# Patient Record
Sex: Female | Born: 1940 | Race: White | Hispanic: No | Smoking: Never smoker
Health system: Southern US, Community
[De-identification: ages and names within clinical notes are randomized; demographics above are authoritative.]

## PROBLEM LIST (undated history)

## (undated) DIAGNOSIS — L02611 Cutaneous abscess of right foot: Secondary | ICD-10-CM

## (undated) DIAGNOSIS — M1990 Unspecified osteoarthritis, unspecified site: Secondary | ICD-10-CM

## (undated) DIAGNOSIS — I350 Nonrheumatic aortic (valve) stenosis: Secondary | ICD-10-CM

## (undated) DIAGNOSIS — J45909 Unspecified asthma, uncomplicated: Secondary | ICD-10-CM

## (undated) DIAGNOSIS — I10 Essential (primary) hypertension: Secondary | ICD-10-CM

## (undated) DIAGNOSIS — R55 Syncope and collapse: Secondary | ICD-10-CM

## (undated) DIAGNOSIS — M810 Age-related osteoporosis without current pathological fracture: Secondary | ICD-10-CM

## (undated) DIAGNOSIS — E109 Type 1 diabetes mellitus without complications: Secondary | ICD-10-CM

## (undated) DIAGNOSIS — R011 Cardiac murmur, unspecified: Secondary | ICD-10-CM

## (undated) DIAGNOSIS — H409 Unspecified glaucoma: Secondary | ICD-10-CM

## (undated) DIAGNOSIS — I739 Peripheral vascular disease, unspecified: Secondary | ICD-10-CM

## (undated) DIAGNOSIS — S129XXA Fracture of neck, unspecified, initial encounter: Secondary | ICD-10-CM

## (undated) DIAGNOSIS — I779 Disorder of arteries and arterioles, unspecified: Secondary | ICD-10-CM

## (undated) DIAGNOSIS — E785 Hyperlipidemia, unspecified: Secondary | ICD-10-CM

## (undated) DIAGNOSIS — H269 Unspecified cataract: Secondary | ICD-10-CM

## (undated) HISTORY — DX: Unspecified glaucoma: H40.9

## (undated) HISTORY — PX: TYMPANOSTOMY TUBE PLACEMENT: SHX32

## (undated) HISTORY — PX: FRACTURE SURGERY: SHX138

## (undated) HISTORY — DX: Cutaneous abscess of right foot: L02.611

## (undated) HISTORY — DX: Age-related osteoporosis without current pathological fracture: M81.0

## (undated) HISTORY — DX: Unspecified cataract: H26.9

## (undated) HISTORY — PX: JOINT REPLACEMENT: SHX530

## (undated) HISTORY — PX: EYE SURGERY: SHX253

---
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
## 1977-11-27 HISTORY — PX: TUBAL LIGATION: SHX77

## 1986-11-27 HISTORY — PX: BREAST SURGERY: SHX581

## 1996-11-27 HISTORY — PX: HAMMER TOE SURGERY: SHX385

## 1998-06-09 ENCOUNTER — Ambulatory Visit (HOSPITAL_COMMUNITY): Admission: RE | Admit: 1998-06-09 | Discharge: 1998-06-09 | Payer: Self-pay | Admitting: Orthopedic Surgery

## 1998-07-15 ENCOUNTER — Other Ambulatory Visit: Admission: RE | Admit: 1998-07-15 | Discharge: 1998-07-15 | Payer: Self-pay | Admitting: Gynecology

## 1998-09-08 ENCOUNTER — Other Ambulatory Visit: Admission: RE | Admit: 1998-09-08 | Discharge: 1998-09-08 | Payer: Self-pay | Admitting: Gynecology

## 1998-09-10 ENCOUNTER — Other Ambulatory Visit: Admission: RE | Admit: 1998-09-10 | Discharge: 1998-09-10 | Payer: Self-pay | Admitting: General Surgery

## 1999-02-04 ENCOUNTER — Ambulatory Visit (HOSPITAL_COMMUNITY): Admission: RE | Admit: 1999-02-04 | Discharge: 1999-02-04 | Payer: Self-pay | Admitting: Orthopedic Surgery

## 1999-09-14 ENCOUNTER — Other Ambulatory Visit: Admission: RE | Admit: 1999-09-14 | Discharge: 1999-09-14 | Payer: Self-pay | Admitting: Gynecology

## 1999-09-14 ENCOUNTER — Encounter: Payer: Self-pay | Admitting: Gynecology

## 1999-09-14 ENCOUNTER — Encounter: Admission: RE | Admit: 1999-09-14 | Discharge: 1999-09-14 | Payer: Self-pay | Admitting: Gynecology

## 1999-11-28 HISTORY — PX: ANKLE FRACTURE SURGERY: SHX122

## 2000-01-04 ENCOUNTER — Encounter: Payer: Self-pay | Admitting: Gynecology

## 2000-01-04 ENCOUNTER — Encounter: Admission: RE | Admit: 2000-01-04 | Discharge: 2000-01-04 | Payer: Self-pay | Admitting: Gynecology

## 2000-02-27 ENCOUNTER — Other Ambulatory Visit: Admission: RE | Admit: 2000-02-27 | Discharge: 2000-02-27 | Payer: Self-pay | Admitting: Gynecology

## 2000-03-17 ENCOUNTER — Encounter: Payer: Self-pay | Admitting: Emergency Medicine

## 2000-03-17 ENCOUNTER — Inpatient Hospital Stay (HOSPITAL_COMMUNITY): Admission: EM | Admit: 2000-03-17 | Discharge: 2000-03-20 | Payer: Self-pay | Admitting: Emergency Medicine

## 2000-03-17 ENCOUNTER — Encounter: Payer: Self-pay | Admitting: Orthopedic Surgery

## 2000-03-19 ENCOUNTER — Encounter: Payer: Self-pay | Admitting: Orthopedic Surgery

## 2000-05-14 ENCOUNTER — Encounter: Admission: RE | Admit: 2000-05-14 | Discharge: 2000-06-27 | Payer: Self-pay | Admitting: Orthopedic Surgery

## 2001-08-14 ENCOUNTER — Encounter: Admission: RE | Admit: 2001-08-14 | Discharge: 2001-11-12 | Payer: Self-pay | Admitting: *Deleted

## 2001-09-10 ENCOUNTER — Encounter: Admission: RE | Admit: 2001-09-10 | Discharge: 2001-09-10 | Payer: Self-pay | Admitting: Gynecology

## 2001-09-10 ENCOUNTER — Encounter: Payer: Self-pay | Admitting: Gynecology

## 2001-10-01 ENCOUNTER — Other Ambulatory Visit: Admission: RE | Admit: 2001-10-01 | Discharge: 2001-10-01 | Payer: Self-pay | Admitting: Gynecology

## 2003-06-11 ENCOUNTER — Other Ambulatory Visit: Admission: RE | Admit: 2003-06-11 | Discharge: 2003-06-11 | Payer: Self-pay | Admitting: Gynecology

## 2003-07-16 ENCOUNTER — Inpatient Hospital Stay (HOSPITAL_COMMUNITY): Admission: EM | Admit: 2003-07-16 | Discharge: 2003-07-22 | Payer: Self-pay | Admitting: Emergency Medicine

## 2003-07-16 ENCOUNTER — Encounter: Payer: Self-pay | Admitting: Emergency Medicine

## 2003-07-17 ENCOUNTER — Encounter: Payer: Self-pay | Admitting: Family Medicine

## 2003-07-17 ENCOUNTER — Encounter (INDEPENDENT_AMBULATORY_CARE_PROVIDER_SITE_OTHER): Payer: Self-pay | Admitting: *Deleted

## 2003-07-17 ENCOUNTER — Encounter: Payer: Self-pay | Admitting: Interventional Cardiology

## 2003-07-20 ENCOUNTER — Encounter: Payer: Self-pay | Admitting: Neurology

## 2003-10-12 ENCOUNTER — Encounter: Admission: RE | Admit: 2003-10-12 | Discharge: 2003-10-12 | Payer: Self-pay | Admitting: Gynecology

## 2003-11-17 ENCOUNTER — Ambulatory Visit (HOSPITAL_COMMUNITY): Admission: RE | Admit: 2003-11-17 | Discharge: 2003-11-17 | Payer: Self-pay | Admitting: Gastroenterology

## 2004-01-20 ENCOUNTER — Encounter (HOSPITAL_BASED_OUTPATIENT_CLINIC_OR_DEPARTMENT_OTHER): Admission: RE | Admit: 2004-01-20 | Discharge: 2004-04-15 | Payer: Self-pay | Admitting: Internal Medicine

## 2004-04-27 ENCOUNTER — Encounter (HOSPITAL_BASED_OUTPATIENT_CLINIC_OR_DEPARTMENT_OTHER): Admission: RE | Admit: 2004-04-27 | Discharge: 2004-07-26 | Payer: Self-pay | Admitting: Internal Medicine

## 2004-06-13 ENCOUNTER — Other Ambulatory Visit: Admission: RE | Admit: 2004-06-13 | Discharge: 2004-06-13 | Payer: Self-pay | Admitting: Gynecology

## 2004-11-14 ENCOUNTER — Encounter (HOSPITAL_BASED_OUTPATIENT_CLINIC_OR_DEPARTMENT_OTHER): Admission: RE | Admit: 2004-11-14 | Discharge: 2005-01-02 | Payer: Self-pay | Admitting: Internal Medicine

## 2005-06-14 ENCOUNTER — Other Ambulatory Visit: Admission: RE | Admit: 2005-06-14 | Discharge: 2005-06-14 | Payer: Self-pay | Admitting: Gynecology

## 2005-08-08 ENCOUNTER — Encounter: Admission: RE | Admit: 2005-08-08 | Discharge: 2005-08-08 | Payer: Self-pay | Admitting: Gynecology

## 2006-01-23 ENCOUNTER — Ambulatory Visit (HOSPITAL_COMMUNITY): Admission: RE | Admit: 2006-01-23 | Discharge: 2006-01-23 | Payer: Self-pay | Admitting: Interventional Cardiology

## 2006-07-04 ENCOUNTER — Other Ambulatory Visit: Admission: RE | Admit: 2006-07-04 | Discharge: 2006-07-04 | Payer: Self-pay | Admitting: Gynecology

## 2006-08-10 ENCOUNTER — Encounter: Admission: RE | Admit: 2006-08-10 | Discharge: 2006-08-10 | Payer: Self-pay | Admitting: Gynecology

## 2007-04-04 ENCOUNTER — Encounter: Admission: RE | Admit: 2007-04-04 | Discharge: 2007-05-27 | Payer: Self-pay | Admitting: *Deleted

## 2007-10-30 ENCOUNTER — Encounter: Payer: Self-pay | Admitting: Endocrinology

## 2007-11-13 ENCOUNTER — Encounter: Payer: Self-pay | Admitting: Endocrinology

## 2007-11-13 ENCOUNTER — Other Ambulatory Visit: Admission: RE | Admit: 2007-11-13 | Discharge: 2007-11-13 | Payer: Self-pay | Admitting: Gynecology

## 2007-12-12 ENCOUNTER — Encounter: Admission: RE | Admit: 2007-12-12 | Discharge: 2007-12-12 | Payer: Self-pay | Admitting: Gynecology

## 2007-12-25 ENCOUNTER — Encounter: Admission: RE | Admit: 2007-12-25 | Discharge: 2007-12-25 | Payer: Self-pay | Admitting: Orthopedic Surgery

## 2007-12-30 ENCOUNTER — Encounter: Payer: Self-pay | Admitting: Endocrinology

## 2008-01-01 ENCOUNTER — Encounter: Payer: Self-pay | Admitting: Endocrinology

## 2008-01-01 ENCOUNTER — Encounter: Admission: RE | Admit: 2008-01-01 | Discharge: 2008-01-01 | Payer: Self-pay | Admitting: Orthopedic Surgery

## 2008-02-05 ENCOUNTER — Encounter: Payer: Self-pay | Admitting: Endocrinology

## 2008-02-11 ENCOUNTER — Ambulatory Visit: Payer: Self-pay | Admitting: Endocrinology

## 2008-02-11 DIAGNOSIS — E042 Nontoxic multinodular goiter: Secondary | ICD-10-CM | POA: Insufficient documentation

## 2008-02-11 LAB — CONVERTED CEMR LAB: TSH: 1.11 microintl units/mL (ref 0.35–5.50)

## 2008-02-25 ENCOUNTER — Encounter: Admission: RE | Admit: 2008-02-25 | Discharge: 2008-02-25 | Payer: Self-pay | Admitting: Endocrinology

## 2008-03-04 ENCOUNTER — Encounter: Payer: Self-pay | Admitting: Internal Medicine

## 2008-03-04 ENCOUNTER — Other Ambulatory Visit: Admission: RE | Admit: 2008-03-04 | Discharge: 2008-03-04 | Payer: Self-pay | Admitting: Interventional Radiology

## 2008-03-04 ENCOUNTER — Encounter: Admission: RE | Admit: 2008-03-04 | Discharge: 2008-03-04 | Payer: Self-pay | Admitting: Endocrinology

## 2008-03-04 ENCOUNTER — Encounter (INDEPENDENT_AMBULATORY_CARE_PROVIDER_SITE_OTHER): Payer: Self-pay | Admitting: Interventional Radiology

## 2008-03-26 ENCOUNTER — Emergency Department (HOSPITAL_COMMUNITY): Admission: EM | Admit: 2008-03-26 | Discharge: 2008-03-26 | Payer: Self-pay | Admitting: Emergency Medicine

## 2008-07-09 ENCOUNTER — Encounter: Admission: RE | Admit: 2008-07-09 | Discharge: 2008-09-07 | Payer: Self-pay | Admitting: Family Medicine

## 2008-11-27 HISTORY — PX: TOTAL SHOULDER REPLACEMENT: SUR1217

## 2008-12-08 ENCOUNTER — Inpatient Hospital Stay (HOSPITAL_COMMUNITY): Admission: RE | Admit: 2008-12-08 | Discharge: 2008-12-10 | Payer: Self-pay | Admitting: Orthopedic Surgery

## 2009-02-26 ENCOUNTER — Encounter: Admission: RE | Admit: 2009-02-26 | Discharge: 2009-02-26 | Payer: Self-pay | Admitting: Family Medicine

## 2009-11-27 HISTORY — PX: CATARACT EXTRACTION W/ INTRAOCULAR LENS  IMPLANT, BILATERAL: SHX1307

## 2009-12-19 ENCOUNTER — Observation Stay (HOSPITAL_COMMUNITY): Admission: EM | Admit: 2009-12-19 | Discharge: 2009-12-19 | Payer: Self-pay | Admitting: Emergency Medicine

## 2010-12-27 NOTE — Assessment & Plan Note (Signed)
Summary: NEW PT/MULTI GOITER -$50 INFO-MEDICRE PER BETH/DR [name]/PC M...   Vital Signs:  Patient Profile:   70 Years Old Female Weight:      142 pounds Temp:     98.1 degrees F oral Pulse rate:   84 / minute Pulse rhythm:   regular BP sitting:   144 / 58  (right arm)  Vitals Entered By: Rock Nephew CMA (February 11, 2008 3:54 PM)                 Visit Type:  Consult Referred by:  b Zachery Dauer, md  Chief Complaint:  thyroid.  History of Present Illness: was incidentally noted on a ct done 12/25/07 (for shoulder pain) to have goiter.  pt does notice the goiter.  denies dysphagia.  feels well in general.  has a tremor only with hypoglycemia.    Current Allergies: ! TETRACYCLINE ! CECLOR ! VANCOMYCIN  Past Medical History:    Reviewed history and no changes required:       good general health   Family History:    Reviewed history and no changes required:       no thyroid dz  Social History:    Reviewed history and no changes required:       divorced       retired    Review of Systems  The patient denies dyspnea on exhertion.     Physical Exam  General:     well developed, well nourished, in no acute distress Neck:     slightly enlarged thyroid, with an irregular surface, but i do not appreciate the right lower pole nodule seen on the ultrasound. 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 Take 1 tablet by mouth once a day 2)   Fosamax 70 Mg Tabs (Alendronate sodium) .... Weekly 3)  Calcium 600mg   4)  Vitamin C500mg   5)  Flax Seed Oil 1000mg     Patient Instructions: 1)  us-guided bx 2)  ret 6 mos if ok 3)  cc dr b Zachery Dauer    ]  Appended Document: NEW PT/MULTI GOITER -$50 INFO-MEDICRE PER BETH/DR [name]/PC M... FAXED NOTES TO DR [name] @ [phone]/LMB

## 2011-01-03 ENCOUNTER — Other Ambulatory Visit: Payer: Self-pay | Admitting: Gynecology

## 2011-02-12 LAB — POCT I-STAT, CHEM 8
BUN: 14 mg/dL (ref 6–23)
Calcium, Ion: 1.09 mmol/L — ABNORMAL LOW (ref 1.12–1.32)
Chloride: 102 mEq/L (ref 96–112)
Creatinine, Ser: 0.9 mg/dL (ref 0.4–1.2)
Glucose, Bld: 251 mg/dL — ABNORMAL HIGH (ref 70–99)
HCT: 39 % (ref 36.0–46.0)
Hemoglobin: 13.3 g/dL (ref 12.0–15.0)
Potassium: 3.6 mEq/L (ref 3.5–5.1)
Sodium: 136 mEq/L (ref 135–145)
TCO2: 30 mmol/L (ref 0–100)

## 2011-02-12 LAB — GLUCOSE, CAPILLARY
Glucose-Capillary: 170 mg/dL — ABNORMAL HIGH (ref 70–99)
Glucose-Capillary: 338 mg/dL — ABNORMAL HIGH (ref 70–99)

## 2011-03-13 LAB — GLUCOSE, CAPILLARY
Glucose-Capillary: 137 mg/dL — ABNORMAL HIGH (ref 70–99)
Glucose-Capillary: 141 mg/dL — ABNORMAL HIGH (ref 70–99)
Glucose-Capillary: 159 mg/dL — ABNORMAL HIGH (ref 70–99)
Glucose-Capillary: 167 mg/dL — ABNORMAL HIGH (ref 70–99)
Glucose-Capillary: 177 mg/dL — ABNORMAL HIGH (ref 70–99)
Glucose-Capillary: 190 mg/dL — ABNORMAL HIGH (ref 70–99)
Glucose-Capillary: 210 mg/dL — ABNORMAL HIGH (ref 70–99)
Glucose-Capillary: 249 mg/dL — ABNORMAL HIGH (ref 70–99)
Glucose-Capillary: 249 mg/dL — ABNORMAL HIGH (ref 70–99)
Glucose-Capillary: 257 mg/dL — ABNORMAL HIGH (ref 70–99)
Glucose-Capillary: 288 mg/dL — ABNORMAL HIGH (ref 70–99)

## 2011-03-13 LAB — CBC
HCT: 31.5 % — ABNORMAL LOW (ref 36.0–46.0)
HCT: 31.7 % — ABNORMAL LOW (ref 36.0–46.0)
HCT: 41.3 % (ref 36.0–46.0)
Hemoglobin: 10.4 g/dL — ABNORMAL LOW (ref 12.0–15.0)
Hemoglobin: 10.8 g/dL — ABNORMAL LOW (ref 12.0–15.0)
Hemoglobin: 13.5 g/dL (ref 12.0–15.0)
MCHC: 32.7 g/dL (ref 30.0–36.0)
MCHC: 33 g/dL (ref 30.0–36.0)
MCHC: 34.2 g/dL (ref 30.0–36.0)
MCV: 93.2 fL (ref 78.0–100.0)
MCV: 94.4 fL (ref 78.0–100.0)
MCV: 95.1 fL (ref 78.0–100.0)
Platelets: 188 10*3/uL (ref 150–400)
Platelets: 203 10*3/uL (ref 150–400)
Platelets: 266 10*3/uL (ref 150–400)
RBC: 3.32 MIL/uL — ABNORMAL LOW (ref 3.87–5.11)
RBC: 3.4 MIL/uL — ABNORMAL LOW (ref 3.87–5.11)
RBC: 4.38 MIL/uL (ref 3.87–5.11)
RDW: 13.9 % (ref 11.5–15.5)
RDW: 14.1 % (ref 11.5–15.5)
RDW: 14.2 % (ref 11.5–15.5)
WBC: 8.2 10*3/uL (ref 4.0–10.5)
WBC: 8.9 10*3/uL (ref 4.0–10.5)
WBC: 9.1 10*3/uL (ref 4.0–10.5)

## 2011-03-13 LAB — BASIC METABOLIC PANEL
BUN: 17 mg/dL (ref 6–23)
BUN: 7 mg/dL (ref 6–23)
BUN: 8 mg/dL (ref 6–23)
CO2: 29 mEq/L (ref 19–32)
CO2: 30 mEq/L (ref 19–32)
CO2: 31 mEq/L (ref 19–32)
Calcium: 8.2 mg/dL — ABNORMAL LOW (ref 8.4–10.5)
Calcium: 8.4 mg/dL (ref 8.4–10.5)
Calcium: 9.5 mg/dL (ref 8.4–10.5)
Chloride: 100 mEq/L (ref 96–112)
Chloride: 101 mEq/L (ref 96–112)
Chloride: 96 mEq/L (ref 96–112)
Creatinine, Ser: 0.71 mg/dL (ref 0.4–1.2)
Creatinine, Ser: 0.71 mg/dL (ref 0.4–1.2)
Creatinine, Ser: 0.8 mg/dL (ref 0.4–1.2)
GFR calc Af Amer: >60 mL/min (ref >60)
GFR calc Af Amer: >60 mL/min (ref >60)
GFR calc Af Amer: >60 mL/min (ref >60)
GFR calc non Af Amer: >60 mL/min (ref >60)
GFR calc non Af Amer: >60 mL/min (ref >60)
GFR calc non Af Amer: >60 mL/min (ref >60)
Glucose, Bld: 196 mg/dL — ABNORMAL HIGH (ref 70–99)
Glucose, Bld: 238 mg/dL — ABNORMAL HIGH (ref 70–99)
Glucose, Bld: 80 mg/dL (ref 70–99)
Potassium: 4.3 mEq/L (ref 3.5–5.1)
Potassium: 4.5 mEq/L (ref 3.5–5.1)
Potassium: 4.9 mEq/L (ref 3.5–5.1)
Sodium: 134 mEq/L — ABNORMAL LOW (ref 135–145)
Sodium: 137 mEq/L (ref 135–145)
Sodium: 139 mEq/L (ref 135–145)

## 2011-04-11 NOTE — Op Note (Signed)
NAME:  Danielle Clarke, Danielle Clarke            ACCOUNT NO.:  [id number]   MEDICAL RECORD NO.:  [id number]          PATIENT TYPE:  INP   LOCATION:  5011                         FACILITY:  [facility]   PHYSICIAN:  Katy Fitch. Sypher, M.D. 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   OPERATION:  Reconstruction of right shoulder utilizing a 9 mm x 115 mm  Bio-Modular stem, a 48 x 19 mm offset head, and a 4-mm inset cemented  polyethylene glenoid component utilizing tobramycin-impregnated cement.   OPERATIONS:  Katy Fitch. Sypher, MD   ASSISTANT:  Marveen Reeks. Dasnoit, PA-C.   ANESTHESIA:  General endotracheal supplemented by attempted interscalene  block, ultimately aborted by Dr. Gypsy Balsam.   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   Danielle Clarke has had end-stage right shoulder arthritis for several years.  Her  primary care physician is Dr. Zachery Dauer of the [facility].  Danielle Clarke  has had several prior orthopedic consults regarding her right shoulder.  Recently, Danielle Clarke had seen Dr. Priscille Kluver for evaluation and management of the  shoulder and was noted to have end-stage glenohumeral arthritis.  Danielle Clarke  had failed response to steroid injection with Depo-Medrol and Marcaine  into the shoulder in August 2008.  Dr. Priscille Kluver advised her to consider  an implant arthroplasty of the shoulder presumably a hemiarthroplasty  based on her description.  Dr. Priscille Kluver sent her for a CT scan and plain  films.  The CT scan documented excellent glenoid bone stock and bone-on-  bone arthropathy at the glenohumeral joint.   Ms.  Clarke sought an alternative upper extremity orthopedic opinion at  [facility] on August 05, 2008.  At that time, Danielle Clarke  was noted to have bone-on-bone arthropathy.  We reviewed the plain  films, CT scan, and clinical examination.  We recommended that Danielle Clarke  strongly consider proceeding with an implant arthroplasty utilizing both  a glenoid replacement and a humeral head replacement.   Danielle Clarke had considerable deformity of the humeral head, which we anticipated  would render replacing the  humeral head challenging.  Danielle Clarke had lost a considerable amount of head  height and preoperatively, we anticipated that there would be  significant challenges placing a humeral head implant given her tight  capsule.   Preoperatively, Danielle Clarke was evaluated by Dr. Zachery Dauer and her cardiologist,  Dr. Katrinka Blazing due to a longstanding history of insulin-dependent diabetes  and background cardiovascular disease including hypertension.  Danielle Clarke was  cleared by both physicians for surgery at this time.   Preoperatively, Danielle Clarke was interviewed by Dr. Gypsy Balsam who recommended and  attempted an interscalene block.  After informed consent, Dr. Gypsy Balsam  sedated her in the holding area of the operating room and had  difficulties with borderline apnea and an untoward reaction to attempt  to injection, therefore this was deferred.   Dr. Gypsy Balsam and I had a lengthy preoperative discussion regarding these  circumstances and elected to proceed with the implant arthroplasty  without benefit of regional block, anticipating wound block with  Marcaine postoperatively.   Danielle Clarke was interviewed in the holding area.  Her antibiotic allergies  and other food allergies were reviewed.  Questions were invited and  answered in detail.   Danielle Clarke was subsequently transferred to room #1 of [facility] Operating  Room, placed in the supine position upon the operating table, and under  Dr. Burnett Corrente direct supervision general endotracheal  anesthesia was  induced.   Our surgical start was considerably delayed due to the issues with the  attempted interscalene block and other issues with the delivery of  preoperative clindamycin.   The clindamycin order was provided by phone at 7 a.m. anticipating 1  hour preop provision.   After the induction of general endotracheal anesthesia, Danielle Clarke was  carefully positioned in the beach-chair position with the aid of a torso  and head holder designed for shoulder arthroscopy.  The entire right  extremityand forequarter was prepped with DuraPrep and draped with  impervious arthroscopy drapes.  Her preoperative range of motion  revealed combined elevation of 150 degrees, external rotation of only 15  degrees at neutral and approximate 30 degrees at 90 degrees of abduction  combined elevation.  Danielle Clarke had marked crepitation with motion of the  shoulder joint and a very tight capsule.   After provision of 300 mg of IV clindamycin, a routine surgical time-out  was accomplished followed by proceeding with the reconstructive surgery.   A 15-cm deltopectoral incision was fashioned from the clavicle through  the deltopectoral interval to the pectoralis major insertion.  Subcutaneous tissues were carefully divided taking care to identify  perforating veins and suture ligate these veins.  The deltopectoral  interval was bluntly dissected followed by hemostasis followed by  release of the clavipectoral fascia.  The short head of the biceps was  partially released with cutting cautery, allowing exposure of the  subscapularis and bursa.  After the bursa was removed, we relaxed a  portion of the coracoacromial ligament.  A Goulet retractor was placed  and the long head of the biceps was palpated.  The subscapularis was  then taken down with the cutting cautery and a 15 blade as well as an  osteotome off the neck of the humerus to allow exposure.  Once joint  entry was accomplished, we removed the  marginal osteophytes with a large  rongeur followed by placement of Crego retractors and a Joker retractor  protecting the axillary nerve.   Great care was taken to identify and suture ligate the anterior humeral  circumflex vessels.   The long head of the biceps was intact as was the rotator cuff.  Given  the fact that the head had flattened over the years, the capsule was  extremely tight rendering access to the joint quite challenging.   With great care, the retractors were placed and after serial capsular  release, we were able to deliver the head and remove the head using the  appropriate cutting guide for the Bio-Modular stem.   The glenoid was then exposed with the use of a Fukuda retractor  posteriorly and a footed glenoid retractor anteriorly.  After  debridement with a rongeur, we subsequently performed an inset glenoid  resurfacing with a 4-mm polyethylene glenoid component.   A very tedious dissection of  eburnated bone was carried out over 45  minutes, creating an absolutely precise inset for the 4-mm polyethylene  glenoid with a keel and multiple cement drill holes being created.   The trial was placed with excellent position followed by cementing of  the glenoid component with tobramycin-impregnated cement.  This cured  over 14 minutes.  All excess cement was removed.  The glenohumeral joint  was then thoroughly lavaged with sterile saline followed by delivery of  the humeral shaft.  Once again due to be intact long head of biceps,  intact rotator cuff, and the capsular contracture, we were challenged to  expose the humeral head.   Once this was accomplished, we reamed the humerus in a standard manner  with the reamers from 6 mm to 10 mm and broached to 9 mm.  It appeared  that the 9-mm stem was appropriate.   Bone graft in the humeral head was placed in the humeral shaft to  facilitate purchase of the implant followed by placement of the Bio-  Modular 9 x 150 mm  stem with standard technique.  Care was taken to  protect the long head of the biceps during placement.   We then spent a considerable period of time trying to appropriately size  the head.  Given the deformity of her humeral head due to years of  arthritis, the standard head did not adequately cover the posterior  aspect of the proximal humerus.  We then elected to use a 24 x 48 mm  offset head which we were able to trial in the satisfactory manner.  A  significant technical challenge was encountered due to the tight  shoulder and given my desire not to release more the rotator cuff nor  the pectoralis major completely controlling the version and rotation of  the offset head became quite a challenge   Using the Biomet tools to release the Morse taper, we ultimately  identified the proper position for anatomic head coverage with a #4  indicator anteriorly.   After routine saline lavage, changing gloves, and no-touch technique, we  placed the 48 x 24 mm head, but found ourselves in the difficult  predicament of a version and rotation issue.  Due to the tight capsule,  we found ourselves about 40 degrees out of phase for proper head  coverage.  We then engageded a rather challenging series of maneuvers to  try to rotate the [facility] taper while relieving its impaction.  Ultimately  it was determined that there was not a particular tool to facilitate  this and that we ultimately elected to discard the 48 x 24 mm head due  to technical impossibility of creating adequate version and switching to  a 19-mm head and neck combination.   While we were able to accomplish this with a trial, we were going to  speak with Dr. Jodi Marble, the designer of the system and ask if a tool could  be made to adequately control version while preventing impaction of the  [facility] taper.   Nonetheless, with a 19-mm x 40-mm head, we were able to easily place the  head in the proper version, reduce the joint, and  reconstruct the soft  tissue envelope.   The rotator cuff was repaired with through bone sutures of #2 FiberWire  and a running baseball stitch creating anatomic repair.   After lavage and hemostasis, a large Hemovac drain was placed through a  stab wound laterally.  This was placed in the deltopectoral  interval and  then secured to suction.  The wound was then closed in layers with 0  Vicryl in the subcutaneous tissues followed by 2-0 Vicryl and segmental  intradermal 3-0 Prolene.   Other than the technical issues with the humeral head, there were no  apparent complications.  We did discard the humeral head due to our  inability to adequately control its rotation and therefore humeral head  coverage.   A precisely coverage of the head was achieved with a 48 x 90 mm head.  By adjusting the soft tissues, we have a very satisfactory  reconstruction of rotator cuff envelope.   Ms. Spiegelman will be provided an additional 300 mg of vancomycin IV  beginning at 12 noon on December 08, 2008.  We anticipate a 48-hour  admission to hospital for observation of her vital signs.      Katy Fitch Sypher, M.D.  Electronically Signed     RVS/MEDQ  D:  12/08/2008  T:  12/09/2008  Job:  [id number]   cc:   Lyn Records, M.D.  Juluis Rainier, M.D.

## 2011-04-14 NOTE — Consult Note (Signed)
NAME:  Danielle Clarke, Danielle Clarke                      ACCOUNT NO.:  [id number]   MEDICAL RECORD NO.:  [id number]                   PATIENT TYPE:  REC   LOCATION:  FOOT                                 FACILITY:  [facility]   PHYSICIAN:  Jonelle Sports. Sevier, M.D.              DATE OF BIRTH:  1941/09/09   DATE OF CONSULTATION:  01/21/2004  DATE OF DISCHARGE:                                   CONSULTATION   HISTORY:  This 70 year old white female with longstanding type 1 diabetes is  referred for management of painful plantar warts bilaterally.   The patient has had diabetes for some 47-48 years and has been on insulin  the entire time.  She remarkably has had very little in the way of  complications of her diabetes but does have some evidence of neuropathy in  the feet.  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  She recalls having had plantar warts dealt with in  her young adult years but has had no recent such troubles.  She has never  had foot ulceration.  She presently has several small calluses on her feet  as well as a prominent painful plantar wart on each.  She is here now for  our evaluation and advise.   PAST MEDICAL HISTORY:  Notable for hypertension in addition to her diabetes.   ALLERGIES:  She is allergic to TETRACYCLINE, CECLOR, and VANCOMYCIN.   MEDICATIONS:  Regular medications include Lantus insulin once daily with  NovoLog sliding scale at meals, Accupril, hydrochlorothiazide, Zocor,  Lopressor, aspirin, Aciphex, flaxseed oil, and Tylenol.   PHYSICAL EXAMINATION:  Examination today is limited to the distal lower  extremities.  The patient's feet are without edema, but there is some degree  of deformity related to bilateral hallux valgus with clawing of the lesser  toes, particularly the second toe, bilaterally.  She has had surgery to  straighten the  proximal IP joint of the left second toe.  Her pulses are  everywhere palpable and on hand-held Doppler testing are found to be  triphasic at the dorsalis pedis areas bilaterally and biphasic at the  posterior tibial areas bilaterally.  Monofilament testing shows that she has  variable sensation in the toes but pretty well loss of protective sensation  over the metatarsal head areas.  Skin temperatures in her feet are normal  and symmetrical.   There are several calluses on the plantar aspects of the feet at the first  and fifth metatarsal heads on the right, the first metatarsal head area on  the left, and at the interphalangeal joint area at both hallices.   There are calluses with cores thought to represent plantar warts underlying  the second metatarsal head on the left and the third metatarsal head on the  right.   There is  an interdigital corn on the lateral aspect of the left fourth toe,  and there is callus formation on the dorsal aspect of the right second toe  at its proximal interphalangeal joint.   DISPOSITION:  1. The patient is given instruction regarding foot care in diabetes by video     with nurse and physician reinforcement.  2. The patient's lesser calluses in the areas aforementioned are dremelled     without incident.  3. The plantar warts at the second metatarsal head area on the left and     third metatarsal head area on the right are sharply pared and excavated     without difficulty.  These two areas are then painted with 15% salicylic     acid and collodion.  4. The corn on the lateral aspect of the left fourth toe is gently shaved as     well.  5. The patient's footwear evaluated and found to be inadequate in both     length and width.  She is advised to obtain shoes of adequate length and     width and also advised that she may need some extra depth because of the     clawing of the toes, particularly the second toe on the right.  6. She is given a  prescription for custom diabetic inserts and referred to     [facility] for this service.  7. Follow-up visit here will be in 3 weeks.                                               Jonelle Sports. Cheryll Cockayne, M.D.    RES/MEDQ  D:  01/21/2004  T:  01/21/2004  Job:  [id number]   cc:   Al Decant. Janey Greaser, MD  [address]  [place]  [place] [address]  Fax: [phone]

## 2011-04-14 NOTE — H&P (Signed)
NAME:  EDDY, Danielle Clarke                      ACCOUNT NO.:  [id number]   MEDICAL RECORD NO.:  [id number]                   PATIENT TYPE:  INP   LOCATION:  3712                                 FACILITY:  [facility]   PHYSICIAN:  Lilyan Punt. Sydnee Levans, M.D.             DATE OF BIRTH:  1941-11-24   DATE OF ADMISSION:  07/16/2003  DATE OF DISCHARGE:                                HISTORY & PHYSICAL   CHIEF COMPLAINT:  Chest pain.   The patient is a 70 year old white female, nonsmoker whose cardiac risk  factors include type 1 diabetes since 1957, hypertension, dyslipidemia, and  postmenopausal state.  This morning at 6:15 she was awakened with substernal  chest pain characterized as a squeezing compression type of pressure below  her breastbone.  It was graded as 7/10 severity and had associated nausea  with vomiting.  EMS was called; she was given aspirin and by the time  reaching ED, her chest pain had resolved.  Her initial cardiac marker in the  ED were negative with an MB of 2.5 and a myoglobin of 99.  ECG showed  nonspecific abnormalities.  She was referred for admission.  She experienced  another bout of similar chest pain during a phlebotomy in the ED and she  graded this severity at 5/10, and it was again a squeezing phenomenon under  the breast bone.  Her second set of cardiac markers were mildly abnormal  with an MB that had gone up to 4.9 and a myoglobin of 500 with no apparent  EKG changes.  Nitroglycerin, beta-blocker, and anticoagulation were  supplementally ordered in the emergency department.   PAST MEDICAL HISTORY:  1. Type 1 DM since 1957, last A1c level of 6.3 in July 2004.  2. Hypertension.  3. Dyslipidemia.  4. Heart murmur with no prior workup.  5. Osteopenia with recent bone density followup that showed improvement in     the interval treatment with Fosamax.  6. Allergic rhinitis, seasonal and perennial.  7. Open angle glaucoma.  8. Open repair of a trimalleolar  left ankle fracture in 2001.  9. Right foot surgery for claw toe deformity.   MEDICATIONS:  1. Aspirin 81 mg nightly.  2. Accupril 40 mg q.d.  3. Lantus 14 units nightly.  4. Humalog sliding scale/NovoLog.  5. Zocor 40 mg q.d.  6. Fosamax 70 mg q.weekly.  7. Multivitamin, generic.  8. Mucinex p.r.n.  9. Calcium q.d.  10.      Flax seed oil.   FAMILY HISTORY:  Father is alive at 64.  He had aortic valve surgery.  Mother died at age 40 with metastatic breast cancer to her brain.  Siblings:  Brother died at 74 years of age, accidental gunshot injury.   SOCIAL HISTORY:  She is divorced; she lives alone, but is close with a  daughter who checks in with her several weeks of the month.  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  She does not smoke or use alcohol.  She swims daily for  exercise.   ALLERGIES:  Ceclor, tetracycline, and vancomycin.   REVIEW OF SYSTEMS:  Chronic cough, postnasal drainage, seasonally worse.  Currently a problem and is taking Mucinex.  She lives through some stressful  issues with disability status and an upcoming wedding, which bring her to  [place] this coming weekend.  She denies weight loss, fevers, chills,  changes in appetite, bloody stools.  Has been followed closely with Dr.  Janey Greaser and prior to that Dr. Janey Greaser for diabetes and by Dr. Nicholas Lose for GYN  issues, which are up-to-date.  She has had some question raised as  peripheral neuropathy.   PHYSICAL EXAMINATION:  VITAL SIGNS:  Blood pressure is 145/50, pulse rate  87, oximetry 97% on 2 liters of oxygen, temp 97.8.  GENERAL:  WDWN female who appears anxious, smiles, and is gesturing  tightness in her chest.  HEENT:  Unremarkable with no signs of infection.  There is though clear  pharyngeal drainage and turbinates are boggy.  NECK:  Supple with JVD.  No carotid bruits.  HEART:  RRR with a systolic murmur grade II/VI that is heard principally   over the precordium.  It does not appear to radiate to carotids.  LUNGS:  CTA throughout.  No wheeze or rhonchi.  ABDOMEN:  Soft, no tenderness.  No palpable mass, megaly, splenic, renal, or  hepatic enlargement.  EXTREMITIES:  Lower extremities reveal no edema.  There is a healing  pretibial ulcer on the right shin.  There is surgical deformities of the  right foot and left ankle.  There is no neuropathy to fine touch.  BACK:  Unremarkable.  SKIN:  Without rash.   EKG suggests anterior strain with about a mm of ST elevation, lead 2, 3, and  4.  On her second chest x-ray reveals clear lung fields with no evidence of  active infiltrate or disease nor cardiomegaly.   ASSESSMENT:  1. Substernal chest pain on a woman with significant cardiac risks and upper     trend to her cardiac markers, rule out unstable angina.  2. Anxiety.  3. Insulin-dependent diabetes mellitus.  4. Hypertension.  5. Dyslipidemia.  6. Postmenopausal.  7. Leukocytosis.    PLAN:  Admit unstable angina pathway.  She has thus been treated with beta-  blocker and nitroglycerin, and full-dose Lovenox and aspirin will need to be  continued.  Check serial enzymes.  Consult cardiology Dr. Katrinka Blazing reached by  Dr.Jacubowitz.                                                  Lilyan Punt Sydnee Levans, M.D.    KCS/MEDQ  D:  07/16/2003  T:  07/16/2003  Job:  [id number]

## 2011-04-14 NOTE — Consult Note (Signed)
NAME:  Danielle Clarke, Danielle Clarke                      ACCOUNT NO.:  [id number]   MEDICAL RECORD NO.:  [id number]                   PATIENT TYPE:  INP   LOCATION:  2926                                 FACILITY:  [facility]   PHYSICIAN:  Pramod P. Pearlean Brownie, MD                 DATE OF BIRTH:  December 21, 1940   DATE OF CONSULTATION:  07/20/2003  DATE OF DISCHARGE:                                   CONSULTATION   CONSULTING PHYSICIAN:  Pramod P. Pearlean Brownie, MD   REFERRING PHYSICIAN:  Lilyan Punt. Sydnee Levans, M.D.   REASON FOR REFERRAL:  Syncope.   HISTORY OF PRESENT ILLNESS:  The patient is a 70 year old pleasant lady who  on last Friday in the hospital felt nauseous and sick to her stomach. She  was sitting on the edge of the bed when all of the sudden she lost  consciousness and fell down. She injured her left eyelid which was bruised  and swollen. She was apparently unconscious for a short period of time and  regained consciousness quickly. She was not found to be disoriented,  confused, or did not complain of headache. She still felt nauseous even  after the event. She was not described by witness to have any tonic/clonic  activity. She had no prior history of chest pain, discomfort, breaking into  a sweat. A blood glucose was checked at this time and was within normal  range. Upon questioning, she admits to possibly a similar episode two years  ago when she was home alone. Apparently, she had eaten well, but, she had  also taken insulin. She is know to be a brittle diabetic. She felt sick and  nauseous and apparently passed out. When she woke up, she felt quite weak  and was unable to get up and had to call for help; 911 was called. EMS found  her blood glucose to be low at 29 mg percent at that time. She had also  fallen and injured and fractured her left ankle which required surgery. This  episode was thought to be hypoglycemia related, and no specific cardiac or  neurological workup was done. Her past  neurological history is fairly  unremarkable. She does have some mild diabetic neuropathy symptoms in her  feet, but these are not bothersome.   PAST MEDICAL HISTORY:  1. Diabetes, insulin dependent, since 1957.  2. Hypertension.  3. Hyperlipidemia.  4. Osteopenia, treated.   PAST SURGICAL HISTORY:  Recent ankle surgery for fracture. Right foot  surgery.   CURRENT MEDICATIONS:  1. Aspirin.  2. Accupril.  3. Lantus insulin.  4. Humulog insulin.  5. Zocor.  6. Fosamax.  7. Multivitamin.  8. Nasonex.  9. Calcium.  10.      Flax seed oil.   FAMILY HISTORY:  Not significant for anybody with strokes or seizures or  neurological problems.   SOCIAL HISTORY:  The patient is retired. She used to work as  an  Engineer, structural. She is presently on disability. She lives alone.  She does not smoke and admits to drinking occasional wine only.   REVIEW OF SYSTEMS:  Not significant for recent fever, loss of weight, cough,  diarrhea. She has had significant nausea in the last one week since  admission. There is no current chest pain or headache.   PHYSICAL EXAMINATION:  GENERAL:  Reveals a frail, pleasant, elderly lady who  is not in distress.  VITAL SIGNS:  She is afebrile with pulse rate of 78 per minute and regular,  respiratory rate 16 per minute, blood pressure 93/60. Distal pulses well  felt.  HEENT:  Head is nontraumatic.  NECK:  Supple. There is right carotid bruit heard.  CARDIAC EXAM:  Reveals ejection systolic murmur.  LUNGS:  Clear to auscultation.  ABDOMEN:  Soft, nontender.  NEUROLOGICAL:  The patient was awake, alert, oriented x3 with normal speech  and language function. There is no aphasia, apraxia, or dysarthria. Pupils  are unequal, the right one being 4 mm in size and reactive. The left one is  6 mm and reactive to light. The left pupil, however, reacts minimally to  accommodation. The pupillary characteristics represent Argyle Robertson  pupil which may be  seen in diabetics. Visual acuity and fields seem  adequate. Face is symmetric bilaterally. Palate is normal, tongue is  midline. Motor system exam reveals symmetric upper and lower extremities,  strength, tone, reflexes bilaterally are depressed.  Plantars - patient  leads to brisk withdrawal to response bilaterally. She has hyperesthesia  over both feet from the ankle down. Position and vibration preserved. Finger-  to-nose coordination is accurate, knee-to-heel was not tested. Gait was not  tested. Examination of the right groin reveals a bandage from cardiac  catheterization earlier today. She has an ecchymotic patch involving the  medial and upper aspect of the left thigh.   LABORATORY DATA:  Recent hospital notes were reviewed. She has had extensive  cardiac workup for syncope including cardiac catheterization which was  negative. Cardiac echocardiogram revealed left ventricular ejection fraction  of 50 to 55%.   IMPRESSION:  A 70 year old lady with recent episode of brief loss of  consciousness, likely a syncopal event. Etiology possibly vasovagal,  although neurologic causes need to be ruled out.   PLAN:  I had a long discussion with patient with regards to the nature of  her symptoms,  assessment, plan for evaluation and treatment and answered  questions. I would like to recommend obtaining a CAT scan of the head  instead of a MRI since she has metal in her foot from her recent surgery.  She will also benefit by having carotid ultrasound, transvenous Doppler  study, as well as, an EEG done to evaluate for cerebrovascular disease or  seizures. We will follow the patient when consulted and be called for  questions.                                               Pramod P. Pearlean Brownie, MD    PPS/MEDQ  D:  07/20/2003  T:  07/21/2003  Job:  [id number]

## 2011-04-14 NOTE — Discharge Summary (Signed)
[facility]  Patient:    Danielle Clarke, Danielle Clarke                   MRN: [id number] Adm. Date:  [id number] Disc. Date: [id number] Attending:  Carolan Shiver Ii Dictator:   Arnoldo Morale, P.A.-C. CC:         Dr. Janey Greaser                           Discharge Summary  ADMITTING DIAGNOSES: 1. Left trimalleolar ankle fracture. 2. Type 1 diabetes mellitus. 3. Hypertension. 4. Osteoporosis. 5. Open angled glaucoma. 6. Allergic rhinitis.  DISCHARGE DIAGNOSES: 1. Left trimalleolar ankle fracture. 2. Type 1 diabetes mellitus. 3. Hypertension. 4. Osteoporosis. 5. Open angled glaucoma. 6. Allergic rhinitis.  SURGICAL PROCEDURE:  On March 17, 2000, [name] underwent an open reduction and internal fixation of her left trimalleolar ankle fracture by Dr. Jonny Ruiz L. Rendall.  COMPLICATIONS:  None.  CONSULTS: 1. Family practice teaching service consults on March 17, 2000. 2. Case management consult on March 20, 2000.  HISTORY OF PRESENT ILLNESS:  This 70 year old white female reports she had passed out in her kitchen this morning and suffered a left ankle fracture. She was unable to bear weight after she fell and she had noted a deformity of her foot.  She is a diabetic on some new blood pressure medication and it is believe that helped contribute to her fall.  She was subsequently brought to [facility] ER where she was found to have a left trimalleolar ankle fracture.  She is admitted for surgical fixation.  HOSPITAL COURSE:  A family practice teaching service consult was obtained prior to surgery for medical clearance.  She was cleared for surgery and tolerated surgery well on April 21.  She was subsequently transferred to 4700. On postoperative day #1, she was afebrile, vital signs stable.  Her CBGs ranged from 254 to 308.  Vitals were stable.  Dressing was intact to her left ankle with no drainage noted.  She was started on PT per  protocol.  On postoperative day #2, she was complaining of some left-sided chest pain especially with deep breaths.  She said this has been present since her fall. She had no other complaints.  Temperature max was 99.3, pulse 105, respirations 20, and blood pressure 144/68.  CBGs were ranging from 95 to 256. She was minimally tender to palpation over the left upper chest area and her legs were neurovascularly intact.  Posterior splint was intact to the left foot.  Dr. Jamie Brookes was following her medically and a portable chest x-ray with rib detail was ordered in addition to a lower extremity venous Doppler. She also was started on Coumadin protocol for DVT prophylaxis at that time.  Chest film showed no acute rib fracture, so she was sent for a VQ scan the day before and that was negative for a pulmonary emboli.  Doppler was also negative for a DVT.  On April 24, she was doing well, temperature max was 101.1, and vitals were stable.  She was still complaining of some left upper chest pain with deep breath but it was improving.  Left leg remained unchanged.  Her PT at that time was 15.1 with an INR of 1.4.  She was believed to be ready for a transfer for discharge home and was discharged home later in the day.  DISCHARGE INSTRUCTIONS: 1. She was to resume all  prehospitalization medications and diet. 2. Percocet one to two tablets p.o. q.4h. p.r.n. for pain, #45 of those with    no refill, and Coumadin with the dose to be determined by pharmacy p.o.    q.d. 3. She was to be arranged for home health R.N. for prothrombin times. 4. She was to keep the left leg elevated and the splint clean and dry.  She    can put an ice pack to the left ankle p.r.n. 5. She is to be out of bed with the walker, nonweightbearing on the left leg. 6. She was to notify Dr. Priscille Kluver of a temperature greater than 101.5, chills,    pain unrelieved by medications, or foul-smelling drainage from the wound. 7. She was  to follow up with Dr. Janey Greaser per his office and she was to follow    up with Dr. Priscille Kluver Thursday or Friday of this week, and call [phone] to    set up that appointment.  She stated good understanding of these instructions and was discharged home.  LABORATORY DATA:  On March 17, 2000, chest x-ray showed no acute abnormality. X-ray taken of her left ankle at that time showed a trimalleolar fracture. Chest x-ray done on April 23 showed no evidence of left rib fractures and no other abnormalities noted.  There was also no active cardiopulmonary disease. A VQ scan done on March 19, 2000, showed very low probability for acute pulmonary emboli.  On April 21, her hemoglobin was 13 with hematocrit of 37.  On April 23, her PT was 13.9 seconds with an INR of 1.2.  On April 24, her PT was 15.1 seconds with an INR of 1.4.  On April 21, her sodium was 137, potassium 4.2, glucose 381. DD:  04/25/00 TD:  04/28/00 Job: [id number]

## 2011-04-14 NOTE — Consult Note (Signed)
NAME:  Danielle Clarke, Danielle Clarke                      ACCOUNT NO.:  [id number]   MEDICAL RECORD NO.:  [id number]                   PATIENT TYPE:  INP   LOCATION:  3712                                 FACILITY:  [facility]   PHYSICIAN:  Lesleigh Noe, M.D.            DATE OF BIRTH:  Feb 02, 1941   DATE OF CONSULTATION:  07/16/2003  DATE OF DISCHARGE:                                   CONSULTATION   REFERRING PHYSICIAN:  Lilyan Punt. Sydnee Levans, M.D.   CONSULTING PHYSICIAN:  Lyn Records, M.D.   CONCLUSIONS:  1. Acute coronary syndrome with sudden onset of chest discomfort at 6:15     a.m.  2. Type 1 diabetes mellitus.     a. Insulin dependent.  3. Hypertension.  4. Hyperlipidemia.  5. Right carotid bruit.  6. Systolic murmur.   RECOMMENDATIONS:  1. IV nitroglycerin.  2. IV heparin.  3. Serial enzymes to rule out MI.  4. Low dose beta blocker therapy.  5. Cath versus Cardiolite depending upon database.  6. Aspirin.  7. Add 2B3 inhibitor if recurrent chest discomfort.   COMMENTS:  The patient is a pleasant 70 year old who awakened at 6:15 a.m.  with severe chest discomfort as though someone were pressing on her mid  sternal area.  There was nausea and vomiting.  The pain decreased to 1/10 by  8:30.  By the time she arrived in the ER there was only minimal discomfort.  She has never had previous discomfort similar to this.  Currently, she is  relatively comfortable.  No nausea, no vomiting, and only very, very minimal  chest burning.  Her medications at home include aspirin 81 mg per day,  Accupril 40 mg per day, Lantus insulin 14 units q.h.s., Humalog insulin  sliding scale with Novolog, Zocor 40 mg per day, Fosamax 70 mg per week,  Mucinex, multivitamin, calcium, flax seed oil.   HABITS:  Denies tobacco and does not drink.   FAMILY HISTORY:  Father had aortic valve replacement and two vessel bypass  at age 44.  Mother died of breast cancer.   ALLERGIES:  1. CECLOR.  2.  PENICILLIN.  3. VANCOMYCIN.   REVIEW OF SYSTEMS:  Unremarkable.  No history of kidney disease.  History of  heart murmur since childhood.  No history of rheumatic fever.   PHYSICAL EXAMINATION:  GENERAL:  The patient is in no acute distress.  VITAL SIGNS:  The blood pressure is 160/70, the heart rate is 92.  SKIN:  Clear.  No xanthelasma, no nailbed cyanosis.  NECK:  Right carotid bruit.  Carotid upstrokes 2+ and symmetric bilaterally.  CHEST:  Clear.  CARDIAC:  A 1 to 2/6 systolic murmur left mid sternal border.  No diastolic  murmur, no S4, gallop.  ABDOMEN:  Soft, no bruits.  EXTREMITIES:  No edema.  Posterior tibial pulses are 2+ bilaterally.  Femoral pulses are 2+.  Right femoral bruit is noted.  LABORATORY DATA:  EKG:  Nonischemic with sinus rhythm at a rate of 85 beats  per minute.  Chest x-ray I have not been able to find.  Laboratory data  reveals normal BUN and creatinine at 19 and 1.  Hemoglobin is 12.5, WBC  18.2.  Initial CK-MB 4.9, troponin 0.05.   DISCUSSION:  The patient's presentation is consistent with an acute coronary  syndrome, especially given her known history of long-standing diabetes.  Serial enzymes need to be done to rule out infarction, low dose beta blocker  therapy needs to be started.  I agree with antiplatelet therapy and  antithrombotic therapy.  Further evaluation would depend upon the patient's  day to day.                                               Lesleigh Noe, M.D.    HWS/MEDQ  D:  07/16/2003  T:  07/17/2003  Job:  [id number]   cc:   Al Decant. Janey Greaser, MD  [address]  [place]  [place] [address]  Fax: [phone]

## 2011-04-14 NOTE — Op Note (Signed)
NAME:  Danielle Clarke, Danielle Clarke                      ACCOUNT NO.:  [id number]   MEDICAL RECORD NO.:  [id number]                   PATIENT TYPE:  AMB   LOCATION:  ENDO                                 FACILITY:  [facility]   PHYSICIAN:  Graylin Shiver, M.D.                DATE OF BIRTH:  [birth year]   DATE OF PROCEDURE:  11/17/2003  DATE OF DISCHARGE:                                 OPERATIVE REPORT   PROCEDURE PERFORMED:  Colonoscopy.   INDICATIONS FOR PROCEDURE:  Screening.   Informed consent was obtained after explanation of the risks of bleeding,  infection, and perforation.   PREMEDICATIONS:  Fentanyl 75 mcg  IV, Versed 7 mg IV.   DESCRIPTION OF PROCEDURE:  With the patient in the left lateral decubitus  position, a rectal exam was performed and no masses were felt.  The Olympus  colonoscope was inserted into the rectum and advanced around the colon to  the cecum.  Cecal landmarks were identified.  The cecum and ascending colon  looked normal.  The transverse colon looked normal.  The descending colon,  sigmoid and rectum looked normal.  The patient tolerated the procedure well  without complications.   IMPRESSION:  Normal colonoscopy to the cecum.   I would recommend a follow-up colonoscopy screening exam again in 10 years.                                               Graylin Shiver, M.D.    SFG/MEDQ  D:  11/17/2003  T:  11/17/2003  Job:  [id number]   cc:   Al Decant. Janey Greaser, MD  [address]  [place]  [place] [address]  Fax: [phone]

## 2011-04-14 NOTE — Discharge Summary (Signed)
NAME:  Danielle Clarke, Danielle Clarke                      ACCOUNT NO.:  [id number]   MEDICAL RECORD NO.:  [id number]                   PATIENT TYPE:  INP   LOCATION:  2040                                 FACILITY:  [facility]   PHYSICIAN:  Jackie Plum, M.D.             DATE OF BIRTH:  [birth year]   DATE OF ADMISSION:  07/16/2003  DATE OF DISCHARGE:  07/22/2003                                 DISCHARGE SUMMARY   DISCHARGE DIAGNOSES:  1. Chest pain. With negative cardiac catheterization indicative for     noncardiac etiology.     A. Adenosine Cardiolite done on July 17, 2003 was positive for anterior        wall ischemia.     B. Cardiac catheterization done on July 19, 2003 by Dr. Verdis Prime was        notable for absence of any obstructive coronary artery disease, normal        left ventricular systolic function.  2. Syncopal episode during hospitalization, deemed vasovagal.     A. Carotid Doppler unremarkable, no evidence of significant internal        carotid artery stenosis. Transcranial Doppler done is also pending.        EEG done. Neurologic was clinically unremarkable. Head CT done was        negative for any acute intracranial pathology. Transcranial Doppler        was completed but this is pending at the time of discharge. Outpatient        followup is recommended.  3. History of type 1 diabetes since 1957. Hemoglobin A1c 6.3% in July 2003.  4. Hypertension.  5. Dyslipidemia.  6. Osteopenia.  7. History of open-angle glaucoma.   DISCHARGE MEDICATIONS:  The patient is going to restart her preadmission  medications which include aspirin, Accupril, Lantus, Humulog, Zocor,  Fosamax, multivitamin, calcium. In addition, the following medications were  added to patient's regimen during hospitalization, and she is going to  continue these medications until seen by her primary care physician in the  office:  1. Metoprolol 25 mg p.o. b.i.d. for hypertension control.  2. Protonix 40  mg p.o. q.d.  3. Xanax 0.25 mg p.o. b.i.d. p.r.n.   DISCHARGE LABORATORY DATA:  WBC count 6.3, hemoglobin 12.2, hematocrit 35.6,  MCV 83.3, platelet count 220. INR 1.0, pro time 12.9, sodium 138, potassium  4.3, chloride 102, CO2 28, glucose 352, BUN 15, creatinine 0.8. Calcium 8.7.   ACTIVITY:  As tolerated.   DIET:  Low salt, 2,000 calorie ADA diet as previously.   DISCHARGE INSTRUCTIONS:  She is to report to M.D. if any problems. The  patient has also been asked to take CBGs as previously prior to admission,  and if there is evidence of consistently increased CBGs more than 200 ml/dl,  she should increase her Lantus dose from 14 units to 20 units. Followup with  her primary care physician, Dr. Janey Greaser.  HISTORY OF PRESENT ILLNESS:  [name] was admitted by Dr. Aundria Rud on July 16, 2003 after presenting with severe chest discomfort as  if something was pressing on her mid sternal area, associated with nausea  and vomiting. Admitting physical exam per Dr. Candis Schatz notes indicates a  BP of 145/50, pulse rate of 87, respiratory rate of 97% on 2 liters of  oxygen, and temperature of 97.8. She did not have any JVD, no carotid bruits  appreciated. She had a regular rate and rhythm with a systolic murmur grade  2/6 over the pericardium without any radiation. Her lung exam was  unremarkable, and her extremity exam did not reveal any edema. Her EKG  showed no ischemic changes with sinus rhythm at a rate of 85 beats per  minute. X-ray did not indicate any edema or acute infiltrate, and her BUN  was 90 with a creatinine of 1.0, hemoglobin was 12.5, and initial CK-MB was  12.9 with a troponin of 0.05.   HOSPITAL COURSE:  Chest pain. The patient was admitted to the hospitalist  service by Dr. Lilyan Punt. [place]. Serial cardiac enzymes were ordered, and  cardiology consultation was obtained with Dr. Garnette Scheuermann who thought that  patient's presentation was consistent with  an acute coronary syndrome,  especially given her known history of long-standing diabetes. He agreed with  serial cardiac enzymes to rule out myocardial infarction. Low dose beta  blocker therapy was started, and she was continued on IV nitroglycerin and  IV heparin.   The patient's chest pain was aborted completely, and 2-D echocardiogram done  on August 20 was notable for EF of 50 to 65%. The study was said to be  suboptimal and was inadequate for the evaluation of the left ventricular  regional wall motion. She had mild thickness of the aortic valve with mild  to moderate thickening of the mitral valve involving the anterior and  posterior leaflet and without significant restriction of motion. There was  mild mitral valvular regurgitation.   In view of absence of any further pain, the patient had a stress test done  as mentioned above, and it indicated changes consistent with anterior  ischemia and was subsequently scheduled for cardiac catheterization. Cardiac  catheterization was done by Dr. Garnette Scheuermann on August 23 and was unremarkable  as noted above. The adenosine Cardiolite stress test is believed to be a  false positive result. The patient is being discharged home chest pain free  today.   Syncopal episode. While patient was in the hospital, the patient experienced  an episode of abdominal discomfort with nausea and vomiting which was  followed by syncopal episode with bradycardia. These changes in retrospect  is reminiscent of vasovagal reaction; however, the family was worried about  possible neurologic event and therefore consulted neurology, Dr. Pearlean Brownie. Dr.  Pearlean Brownie of neurology felt that the episode was likely a vasovagal reaction  although neurologic causes could not be ruled out and therefore recommended  a CT of the head, EEG, and carotid Doppler, all of which were unremarkable. At this point, no further neurologic workup was deemed necessary, and it was  believed that  patient's event was vasovagal reaction to her discomfort in  her GI symptoms. This was also supported by the fact that patient was  bradycardiac concurrently with episode.   Diabetes. I first saw patient yesterday for the first time, and review of  her vital signs, I realized that her blood sugars have been mostly  above  200, between 200 and 300. I discussed the possibility of increasing her  Lantus from 14 to 20 and seeing how her self-control would do and then be  adjusted appropriately; however, the patient was strongly insistent because  she thought that her blood sugars had not been taking appropriately. The  patient has a long-standing history of diabetes mellitus. She takes her own  insulin at home and monitors her own glucose levels, and she seemed to be  very good with this at home. Therefore, have agreed that patient be sent  home on her usual dose of Lantus since she does not want it increased, and  then the dose will be increased as mentioned under the instruction section  if her levels are more than 200. The patient expressed understanding in this  regard.   CONSULTANTS:  Pramod P. Pearlean Brownie, M.D., and Dr. Garnette Scheuermann of cardiology.    PROCEDURES:  As noted above.   CONDITION ON DISCHARGE:  Improved and stable.                                                Jackie Plum, M.D.    GO/MEDQ  D:  07/22/2003  T:  07/23/2003  Job:  [id number]   cc:   Lyn Records III, M.D.  [address]  [address]  [place]  [place] [address]  Fax: [phone]

## 2011-04-14 NOTE — Consult Note (Signed)
. [facility]  Patient:    Danielle Clarke, Danielle Clarke                     MRN: [id number] Proc. Date: 03/17/00 Attending:  Caryn Bee L. Little, M.D. Dictator:   Anna Genre. Little, M.D.                          Consultation Report  HISTORY OF PRESENT ILLNESS:  I was asked to see Danielle Clarke a 70 year old white female patient of Dr. Janey Greaser at [facility].  In usual state of health until this morning.  She had a syncopal episode while in the kitchen.  EMS was called, and CBG was found to be quite low at 29.  The patient is an insulin dependent diabetic.  She sustained a left ankle fracture requiring surgery by Dr. Priscille Kluver tonight.  She was admits with poorly controlled insulin-dependent diabetes mellitus, takes Humulin N 20 units in the morning and 5 units in the evening, plus supplemental Humulog on a sliding scale.  Her hemoglobin A1C in March was high at 9.3%.  The patient had been on Avandia in the past, but that was discontinued last July because it was felt to have no benefit because of her insulin resistance.  PAST MEDICAL HISTORY AND PAST SURGICAL HISTORY: 1. T&A in 11. 2. Diabetes first diagnosed in 6. 3. Childbirth in 75 and 1971. 4. BTL in 1977. 5. Benign breast lumpectomy in 1988. 6. Claw toe correction in 1995.  ILLNESSES: 1. History of insulin-dependent diabetes mellitus. 2. Hypertension. 3. Osteoporosis. 4. Allergic rhinitis. 5. Open angle glaucoma.  ALLERGIES:  CECLOR, TETRACYCLINE, AND POSSIBLY VANCOMYCIN.  CURRENT MEDICATIONS: 1. Accupril 20 mg q.d. 2. Allegra 60 mg b.i.d. 3. Humulin NPH 20 q.a.m. and 5 q.p.m. plus sliding scale insulin with Humulog. 4. Dr. Nicholas Lose, her gynecologist, recently discontinued Evista and Estrace. 5. Fosamax 10 mg q.d. 6. Natural tears daily for open angle glaucoma.  SOCIAL HISTORY:  Divorced in 91.  Diplomatic Services operational officer at [employer].  Nonsmoker, rare alcohol use.  FAMILY HISTORY:  Mother  died at age 70 of brain cancer.  Father has bladder cancer.  One brother died of gunshot wound.  REVIEW OF SYSTEMS:  Pneumovax in 1988, tetanus in 1994.  She gets flu shots every fall.  Creatinine of 0.9 last month.  In the past HDL has been excellent at 75 last year.  She has seen Dr. Criss Alvine in the past for endocrinology.  PHYSICAL EXAMINATION:  GENERAL:  The patient is postoperative and arousable.  Oriented x 3.  VITAL SIGNS:  Blood pressure 96/40, temperature 99.7.  HEENT:  Normocephalic, atraumatic.  EOMI.  PERRLA.  RESPIRATORY:  Clear without rales.  HEART:  No murmurs, rubs, clicks, or gallops.  LABORATORY DATA:  EKG normal sinus rhythm 82 with no ischemic or acute changes.  ASSESSMENT AND PLAN: 1. Insulin-dependent diabetes mellitus with recent syncopal episode from    hypoglycemia.  We will have to monitor the patients glucoses in the    hospital with CBGs q.i.d., Humulin on her usual dose, and see how much    sliding scale insulin she is requiring. 2. Hypertension.  Continue Accupril for now, but hold it for blood pressure    systolic readings less than or equal to 120. 3. Osteoporosis.  We will hold Fosamax for now to avoid stomach upset, and    when she is taking p.o. well can restart Fosamax at  a once a week dose. DD:  03/17/00 TD:  03/17/00 Job: [id number]

## 2011-05-30 ENCOUNTER — Ambulatory Visit: Payer: Medicare Other | Attending: Family Medicine | Admitting: Physical Therapy

## 2011-05-30 DIAGNOSIS — IMO0001 Reserved for inherently not codable concepts without codable children: Secondary | ICD-10-CM | POA: Insufficient documentation

## 2011-05-30 DIAGNOSIS — M2560 Stiffness of unspecified joint, not elsewhere classified: Secondary | ICD-10-CM | POA: Insufficient documentation

## 2011-05-30 DIAGNOSIS — M542 Cervicalgia: Secondary | ICD-10-CM | POA: Insufficient documentation

## 2011-06-01 ENCOUNTER — Ambulatory Visit: Payer: Medicare Other | Admitting: Physical Therapy

## 2011-06-05 ENCOUNTER — Ambulatory Visit: Payer: Medicare Other | Admitting: Physical Therapy

## 2011-06-06 ENCOUNTER — Ambulatory Visit: Payer: Medicare Other | Admitting: Physical Therapy

## 2011-06-08 ENCOUNTER — Ambulatory Visit: Payer: Medicare Other | Admitting: Physical Therapy

## 2011-06-08 ENCOUNTER — Encounter: Payer: Self-pay | Admitting: Physical Therapy

## 2011-06-13 ENCOUNTER — Ambulatory Visit: Payer: Medicare Other | Admitting: Physical Therapy

## 2011-06-15 ENCOUNTER — Encounter: Payer: Self-pay | Admitting: Physical Therapy

## 2011-06-16 ENCOUNTER — Ambulatory Visit: Payer: Medicare Other | Admitting: Physical Therapy

## 2011-06-19 ENCOUNTER — Ambulatory Visit: Payer: Medicare Other | Admitting: Physical Therapy

## 2011-06-20 ENCOUNTER — Encounter: Payer: Self-pay | Admitting: Physical Therapy

## 2011-06-22 ENCOUNTER — Ambulatory Visit: Payer: Medicare Other | Admitting: Physical Therapy

## 2011-08-22 LAB — CBC
HCT: 38.4
Hemoglobin: 12.7
MCHC: 33
MCV: 96.4
Platelets: 236
RBC: 3.98
RDW: 15.2
WBC: 10.7 — ABNORMAL HIGH

## 2011-08-22 LAB — COMPREHENSIVE METABOLIC PANEL
ALT: 18
AST: 21
Albumin: 3.8
Alkaline Phosphatase: 54
BUN: 19
CO2: 32
Calcium: 9.3
Chloride: 101
Creatinine, Ser: 0.84
GFR calc Af Amer: >60
GFR calc non Af Amer: >60
Glucose, Bld: 154 — ABNORMAL HIGH
Potassium: 4.3
Sodium: 138
Total Bilirubin: 0.6
Total Protein: 6.5

## 2011-08-22 LAB — DIFFERENTIAL
Basophils Absolute: 0
Basophils Relative: 0
Eosinophils Absolute: 0.2
Eosinophils Relative: 1
Lymphocytes Relative: 14
Lymphs Abs: 1.5
Monocytes Absolute: 0.6
Monocytes Relative: 6
Neutro Abs: 8.4 — ABNORMAL HIGH
Neutrophils Relative %: 78 — ABNORMAL HIGH

## 2011-08-22 LAB — URINALYSIS, ROUTINE W REFLEX MICROSCOPIC
Bilirubin Urine: NEGATIVE
Glucose, UA: 100 — AB
Hgb urine dipstick: NEGATIVE
Ketones, ur: NEGATIVE
Nitrite: NEGATIVE
Protein, ur: NEGATIVE
Specific Gravity, Urine: 1.018
Urobilinogen, UA: 0.2
pH: 5.5

## 2011-08-22 LAB — URINE MICROSCOPIC-ADD ON

## 2011-08-22 LAB — URINE CULTURE: Colony Count: 10000

## 2011-12-25 ENCOUNTER — Other Ambulatory Visit: Payer: Self-pay | Admitting: Gynecology

## 2011-12-25 DIAGNOSIS — Z1231 Encounter for screening mammogram for malignant neoplasm of breast: Secondary | ICD-10-CM

## 2012-01-08 ENCOUNTER — Ambulatory Visit
Admission: RE | Admit: 2012-01-08 | Discharge: 2012-01-08 | Disposition: A | Discharge status: Home / Self Care | Payer: Medicare Other | Source: Ambulatory Visit | Attending: Gynecology | Admitting: Gynecology

## 2012-01-08 ENCOUNTER — Other Ambulatory Visit: Payer: Self-pay | Admitting: Gynecology

## 2012-01-08 DIAGNOSIS — Z1231 Encounter for screening mammogram for malignant neoplasm of breast: Secondary | ICD-10-CM

## 2012-04-04 ENCOUNTER — Other Ambulatory Visit: Payer: Self-pay | Admitting: Family Medicine

## 2012-04-04 DIAGNOSIS — I6529 Occlusion and stenosis of unspecified carotid artery: Secondary | ICD-10-CM

## 2012-04-05 ENCOUNTER — Ambulatory Visit
Admission: RE | Admit: 2012-04-05 | Discharge: 2012-04-05 | Disposition: A | Discharge status: Home / Self Care | Payer: Medicare Other | Source: Ambulatory Visit | Attending: Family Medicine | Admitting: Family Medicine

## 2012-04-05 DIAGNOSIS — I6529 Occlusion and stenosis of unspecified carotid artery: Secondary | ICD-10-CM

## 2012-05-04 ENCOUNTER — Encounter (HOSPITAL_COMMUNITY): Payer: Self-pay | Admitting: Emergency Medicine

## 2012-05-04 ENCOUNTER — Emergency Department (HOSPITAL_COMMUNITY)
Admission: EM | Admit: 2012-05-04 | Discharge: 2012-05-04 | Disposition: A | Discharge status: Home / Self Care | Payer: Medicare Other | Attending: Emergency Medicine | Admitting: Emergency Medicine

## 2012-05-04 DIAGNOSIS — E162 Hypoglycemia, unspecified: Secondary | ICD-10-CM

## 2012-05-04 DIAGNOSIS — E1169 Type 2 diabetes mellitus with other specified complication: Secondary | ICD-10-CM | POA: Insufficient documentation

## 2012-05-04 DIAGNOSIS — Z794 Long term (current) use of insulin: Secondary | ICD-10-CM | POA: Insufficient documentation

## 2012-05-04 LAB — CBC
HCT: 36 % (ref 36.0–46.0)
Hemoglobin: 12.1 g/dL (ref 12.0–15.0)
MCH: 31.2 pg (ref 26.0–34.0)
MCHC: 33.6 g/dL (ref 30.0–36.0)
MCV: 92.8 fL (ref 78.0–100.0)
Platelets: 268 10*3/uL (ref 150–400)
RBC: 3.88 MIL/uL (ref 3.87–5.11)
RDW: 13.9 % (ref 11.5–15.5)
WBC: 11.2 10*3/uL — ABNORMAL HIGH (ref 4.0–10.5)

## 2012-05-04 LAB — BASIC METABOLIC PANEL
BUN: 27 mg/dL — ABNORMAL HIGH (ref 6–23)
CO2: 28 mEq/L (ref 19–32)
Calcium: 9.5 mg/dL (ref 8.4–10.5)
Chloride: 99 mEq/L (ref 96–112)
Creatinine, Ser: 0.93 mg/dL (ref 0.50–1.10)
GFR calc Af Amer: 71 mL/min — ABNORMAL LOW (ref >90)
GFR calc non Af Amer: 61 mL/min — ABNORMAL LOW (ref >90)
Glucose, Bld: 120 mg/dL — ABNORMAL HIGH (ref 70–99)
Potassium: 4.1 mEq/L (ref 3.5–5.1)
Sodium: 136 mEq/L (ref 135–145)

## 2012-05-04 LAB — URINALYSIS, ROUTINE W REFLEX MICROSCOPIC
Bilirubin Urine: NEGATIVE
Glucose, UA: NEGATIVE mg/dL
Hgb urine dipstick: NEGATIVE
Ketones, ur: NEGATIVE mg/dL
Leukocytes, UA: NEGATIVE
Nitrite: NEGATIVE
Protein, ur: NEGATIVE mg/dL
Specific Gravity, Urine: 1.021 (ref 1.005–1.030)
Urobilinogen, UA: 0.2 mg/dL (ref 0.0–1.0)
pH: 5.5 (ref 5.0–8.0)

## 2012-05-04 LAB — GLUCOSE, CAPILLARY
Glucose-Capillary: 176 mg/dL — ABNORMAL HIGH (ref 70–99)
Glucose-Capillary: 109 mg/dL — ABNORMAL HIGH (ref 70–99)

## 2012-05-04 NOTE — ED Notes (Signed)
ZOX:[id number]<AV> Expected date:05/04/12<BR> Expected time: 4:38 PM<BR> Means of arrival:Ambulance<BR> Comments:<BR> M20. 70 f. Low blood sugar now corrected. 10 mins

## 2012-05-04 NOTE — ED Notes (Addendum)
CBG 176 at 1650. Pt reports 0800 with a CBG of 40 and took 2 units of Novolog and 20 of Lantus because she knew she was going to take sugar during her meal. Pt reports CBG 67 and BP of 138/56 at 1154. CBG per pt was 233 at 1353 and took 4 units of Novolog.

## 2012-05-04 NOTE — ED Notes (Addendum)
Per EMS. Pt found on car, awake, but not oriented. Slightly combative. CBG on arrival was 35 1610, D50 was given, and CBG of 207 at 1625. Initial VS 148/88, Pulse 72 NSR at 1645. History of Diabetes since 1957, takes Lantus. Allergic to tetracycline, and vancomycin. IV left AC 20 gauge.

## 2012-05-04 NOTE — ED Provider Notes (Signed)
History     CSN: [id number]  Arrival date & time 05/04/12  1646   First MD Initiated Contact with Patient 05/04/12 1705      Chief Complaint  Patient presents with  . Hypoglycemia    (Consider location/radiation/quality/duration/timing/severity/associated sxs/prior treatment) HPI Patient presents emergency department following hypoglycemic episode.  Patient, states she has been taking a little more insulin to to help reduce her hemoglobin A1c numbers.  Patient, states she has had this problem over the last couple weeks.  Today, the patient said she got flushed feeling and store she was shopping and called 911.  Patient denies syncope, weakness, nausea, vomiting, dizziness, shortness of breath or chest pain.  Past Medical History  Diagnosis Date  . Diabetes mellitus 1957    Past Surgical History  Procedure Date  . Left ankle surgery     steel plate and 3 screws   . 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 Eye surgery     cataract surgery    No family history on file.  History  Substance Use Topics  . Smoking status: Never Smoker   . Smokeless tobacco: Never Used  . Alcohol Use: 0.6 oz/week    1 Glasses of wine per week    OB History    Grav Para Term Preterm Abortions TAB SAB Ect Mult Living                  Review of Systems All other systems negative except as documented in the HPI. All pertinent positives and negatives as reviewed in the HPI.  Allergies  Cefaclor; Tetracycline; and Vancomycin  Home Medications   Current Outpatient Rx  Name Route Sig Dispense Refill  . ALENDRONATE SODIUM 40 MG PO TABS Oral Take 40 mg by mouth every 7 (seven) days. Take with a full glass of water on an empty stomach every Wednesday morning.    Marland Kitchen VITAMIN C PO Oral Take 1 tablet by mouth daily at 12 noon.    . ASPIRIN 81 MG PO CHEW Oral Chew 81 mg by mouth at bedtime.    Marland Kitchen VITAMIN D 1000 UNITS PO TABS Oral Take 2,000 Units by mouth at bedtime.    . CHROMIUM 1000  MCG PO TABS Oral Take 1,000 mcg by mouth every morning.    Marland Kitchen CINNAMON PO Oral Take 1,000 mg by mouth 4 (four) times daily.    Marland Kitchen DICLOFENAC SODIUM 75 MG PO TBEC Oral Take 75 mg by mouth every morning.    Marland Kitchen FLAXSEED (LINSEED) 1000 MG PO CAPS Oral Take 1,000 mg by mouth 4 (four) times daily.    Marland Kitchen GLUCOSAMINE-CHONDROITIN 500-400 MG PO TABS Oral Take 1 tablet by mouth 2 (two) times daily at 10 am and 4 pm.    . INSULIN ASPART 100 UNIT/ML [place] SOLN Subcutaneous Inject 2-8 Units into the skin See admin instructions. Sliding scale    . INSULIN GLARGINE 100 UNIT/ML [place] SOLN Subcutaneous Inject 20 Units into the skin every morning.     . ADULT MULTIVITAMIN W/MINERALS CH Oral Take 1 tablet by mouth every morning.    Marland Kitchen OMEGA 3 1200 MG PO CAPS Oral Take 1,200 mg by mouth 2 times daily at 12 noon and 4 pm.    . QUINAPRIL HCL 40 MG PO TABS Oral Take 40 mg by mouth every morning.    Marland Kitchen SIMVASTATIN 40 MG PO TABS Oral Take 40 mg by mouth every evening.    Marland Kitchen TRAMADOL  HCL 50 MG PO TABS Oral Take 50 mg by mouth at bedtime.    . TURMERIC PO Oral Take 1 capsule by mouth daily at 12 noon.      BP 165/53  Pulse 82  Temp(Src) 97.5 F (36.4 C) (Axillary)  SpO2 100%  Physical Exam  Nursing note and vitals reviewed. Constitutional: She is oriented to person, place, and time. She appears well-developed and well-nourished. No distress.  HENT:  Head: Normocephalic and atraumatic.  Mouth/Throat: Oropharynx is clear and moist.  Eyes: Pupils are equal, round, and reactive to light.  Neck: Normal range of motion. Neck supple.  Cardiovascular: Normal rate, regular rhythm and normal heart sounds.   Pulmonary/Chest: Effort normal and breath sounds normal.  Abdominal: Soft. Bowel sounds are normal. She exhibits no distension. There is no tenderness.  Neurological: She is alert and oriented to person, place, and time.  Skin: Skin is warm and dry. No rash noted. She is not diaphoretic.    ED Course  Procedures (including  critical care time)  Labs Reviewed  GLUCOSE, CAPILLARY - Abnormal; Notable for the following:    Glucose-Capillary 176 (*)    All other components within normal limits  BASIC METABOLIC PANEL - Abnormal; Notable for the following:    Glucose, Bld 120 (*)    BUN 27 (*)    GFR calc non Af Amer 61 (*)    GFR calc Af Amer 71 (*)    All other components within normal limits  CBC - Abnormal; Notable for the following:    WBC 11.2 (*)    All other components within normal limits  GLUCOSE, CAPILLARY - Abnormal; Notable for the following:    Glucose-Capillary 109 (*)    All other components within normal limits  URINALYSIS, ROUTINE W REFLEX MICROSCOPIC   the observe the patient, over many hours here in the emergency and her blood sugars remained stable.  Patient is advised to return here for any worsening in her condition.  The family they and her advised she may not need to be using as much insulin as she has been since these hypoglycemic episodes have increased. I have asked her to followup with her primary care doctor for recheck Patient is completely stable at this time.  She would like to go home. Told to return here as needed.    MDM   MDM Reviewed: vitals and nursing note Interpretation: labs           Carlyle Dolly, PA-C 05/05/12 [phone]

## 2012-05-04 NOTE — ED Notes (Signed)
Patient given discharge instructions, information, prescriptions, and diet order. Patient states that they adequately understand discharge information given and to return to ED if symptoms return or worsen.     

## 2012-05-04 NOTE — Discharge Instructions (Signed)
Return here as needed. Follow up with your doctor for a recheck. °

## 2012-05-05 NOTE — ED Provider Notes (Signed)
Medical screening examination/treatment/procedure(s) were performed by non-physician practitioner and as supervising physician I was immediately available for consultation/collaboration.   Dayton Bailiff, MD 05/05/12 [phone]

## 2012-06-27 ENCOUNTER — Emergency Department (HOSPITAL_COMMUNITY)
Admission: EM | Admit: 2012-06-27 | Discharge: 2012-06-27 | Disposition: A | Discharge status: Home / Self Care | Payer: Medicare Other | Attending: Emergency Medicine | Admitting: Emergency Medicine

## 2012-06-27 ENCOUNTER — Encounter (HOSPITAL_COMMUNITY): Payer: Self-pay | Admitting: *Deleted

## 2012-06-27 ENCOUNTER — Emergency Department (HOSPITAL_COMMUNITY): Payer: Medicare Other

## 2012-06-27 DIAGNOSIS — S129XXA Fracture of neck, unspecified, initial encounter: Secondary | ICD-10-CM

## 2012-06-27 DIAGNOSIS — M545 Low back pain, unspecified: Secondary | ICD-10-CM | POA: Insufficient documentation

## 2012-06-27 DIAGNOSIS — W06XXXA Fall from bed, initial encounter: Secondary | ICD-10-CM | POA: Insufficient documentation

## 2012-06-27 DIAGNOSIS — M542 Cervicalgia: Secondary | ICD-10-CM | POA: Insufficient documentation

## 2012-06-27 DIAGNOSIS — E119 Type 2 diabetes mellitus without complications: Secondary | ICD-10-CM | POA: Insufficient documentation

## 2012-06-27 DIAGNOSIS — Z794 Long term (current) use of insulin: Secondary | ICD-10-CM | POA: Insufficient documentation

## 2012-06-27 DIAGNOSIS — IMO0002 Reserved for concepts with insufficient information to code with codable children: Secondary | ICD-10-CM | POA: Insufficient documentation

## 2012-06-27 DIAGNOSIS — M549 Dorsalgia, unspecified: Secondary | ICD-10-CM | POA: Insufficient documentation

## 2012-06-27 HISTORY — DX: Unspecified osteoarthritis, unspecified site: M19.90

## 2012-06-27 NOTE — ED Provider Notes (Signed)
History     CSN: [id number]  Arrival date & time 06/27/12  1141   First MD Initiated Contact with Patient 06/27/12 1234      Chief Complaint  Patient presents with  . Fall    ?c3/c4 fx    (Consider location/radiation/quality/duration/timing/severity/associated sxs/prior treatment) The history is provided by the patient.   71 year old female presents to the emergency department with a chief complaint of fall from her bed early Tuesday morning and continued neck and back pain since that time. She denies any headache, visual change, difficulty speaking, numbness or weakness to the arms or legs, difficulty ambulating. Per family, mental status is at baseline. Her pain is worse with movement. Has taken tramadol with some improvement. Was seen at her primary doctor's office today and had plain films done of the cervical spine which showed C3-C4 fracture with possible anterolisthesis and question of ligamentous injury. She was referred to the emergency department for an MRI for further evaluation. A cervical collar was placed in triage by the nurse.  Past Medical History  Diagnosis Date  . Diabetes mellitus 1957  . Arthritis     Past Surgical History  Procedure Date  . Left ankle surgery     steel plate and 3 screws   . 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 Eye surgery     cataract surgery    History reviewed. No pertinent family history.  History  Substance Use Topics  . Smoking status: Never Smoker   . Smokeless tobacco: Never Used  . Alcohol Use: 0.6 oz/week    1 Glasses of wine per week     Review of Systems Pertinent positives and negatives are reviewed in the history of present illness. Allergies  Cefaclor; Tetracycline; and Vancomycin  Home Medications   Current Outpatient Rx  Name Route Sig Dispense Refill  . ALENDRONATE SODIUM 40 MG PO TABS Oral Take 40 mg by mouth every 7 (seven) days. Take with a full glass of water on an empty stomach every  Wednesday morning.    Marland Kitchen VITAMIN C PO Oral Take 1 tablet by mouth daily at 12 noon.    . ASPIRIN 81 MG PO CHEW Oral Chew 81 mg by mouth at bedtime.    Marland Kitchen VITAMIN D 1000 UNITS PO TABS Oral Take 2,000 Units by mouth at bedtime.    . CHROMIUM 1000 MCG PO TABS Oral Take 1,000 mcg by mouth every morning.    Marland Kitchen CINNAMON PO Oral Take 1,000 mg by mouth 4 (four) times daily.    Marland Kitchen DICLOFENAC SODIUM 75 MG PO TBEC Oral Take 75 mg by mouth every morning.    Marland Kitchen FLAXSEED (LINSEED) 1000 MG PO CAPS Oral Take 1,000 mg by mouth 4 (four) times daily.    Marland Kitchen GLUCOSAMINE-CHONDROITIN 500-400 MG PO TABS Oral Take 1 tablet by mouth 2 (two) times daily at 10 am and 4 pm.    . INSULIN ASPART 100 UNIT/ML [place] SOLN Subcutaneous Inject 2-8 Units into the skin See admin instructions. Sliding scale    . INSULIN GLARGINE 100 UNIT/ML [place] SOLN Subcutaneous Inject 18 Units into the skin every morning.     . ADULT MULTIVITAMIN W/MINERALS CH Oral Take 1 tablet by mouth every morning.    Marland Kitchen OMEGA 3 1200 MG PO CAPS Oral Take 1,200 mg by mouth 2 times daily at 12 noon and 4 pm.    . OVER THE COUNTER MEDICATION Topical Apply 1 application topically 4 (four)  times daily. otc arth-rx roll on muscle rub cream    . QUINAPRIL HCL 40 MG PO TABS Oral Take 40 mg by mouth every morning.    Marland Kitchen SIMVASTATIN 40 MG PO TABS Oral Take 40 mg by mouth every evening.    Marland Kitchen TRAMADOL HCL 50 MG PO TABS Oral Take 50 mg by mouth 2 (two) times daily.     . TURMERIC PO Oral Take 1 capsule by mouth daily at 12 noon.      BP 117/42  Pulse 71  Temp 98.3 F (36.8 C) (Oral)  Resp 16  Ht 5\' 2"  (1.575 m)  Wt 133 lb 8 oz (60.555 kg)  BMI 24.42 kg/m2  SpO2 98%  Physical Exam  Nursing note reviewed. Constitutional: She is oriented to person, place, and time. She appears well-developed and well-nourished. No distress.       Vital signs are reviewed and are normal.  Cardiovascular: Normal rate, regular rhythm and normal heart sounds.   Pulmonary/Chest: Effort normal and  breath sounds normal. No respiratory distress. She has no wheezes.  Abdominal: Soft. She exhibits no distension. There is no tenderness.  Musculoskeletal:       Tenderness to palpation over the midline cervical, thoracic, and lumbar spine. Worse to the cervical spine. There is cervical paraspinal tenderness to palpation. No deformity is palpated. Extremities are nontender to palpation.  Neurological: She is alert and oriented to person, place, and time. She has normal strength. No cranial nerve deficit (3 through 12 are tested and are intact) or sensory deficit (intact to light touch in all extremities). Gait normal. GCS eye subscore is 4. GCS verbal subscore is 5. GCS motor subscore is 6.       Speech is clear and appropriate. Mental status appears baseline per patient and situation.  Skin: Skin is warm and dry.       Abrasion to the left mid shin, bandage an antibiotic ointment in place. No evidence of surrounding infection.  Psychiatric: She has a normal mood and affect.    ED Course  Procedures (including critical care time)  Labs Reviewed - No data to display Dg Thoracic Spine 2 View  06/27/2012  *RADIOLOGY REPORT*  Clinical Data: Fall 2 days ago.  Mid to low back pain.  THORACIC SPINE - 2 VIEW  Comparison: None.  Findings: Cervical fractures not well visualized on thoracic series.  Mild lower thoracic dextroconvex scoliosis, associated with S-shaped thoracolumbar scoliosis.  Vertebral body height is preserved.  Paraspinal lines appear within normal limits.  The cervicothoracic junction poorly visualized due to bony overlap. This can be evaluated further on MRI or CT.  IMPRESSION: No acute osseous abnormality of the thoracic spine.  Poor visualization of the cervicothoracic junction.  Original Report Authenticated By: Andreas Newport, M.D.   Dg Lumbar Spine Complete  06/27/2012  *RADIOLOGY REPORT*  Clinical Data: Fall.  Back pain.  LUMBAR SPINE - COMPLETE 4+ VIEW  Comparison: None.  Findings:  Moderate levoconvex lumbar scoliosis with the apex at L3. Lateral slip of L3 on L4 measuring about 1 cm.  The scoliosis is rotatory. Degenerative disease of the pubic symphysis is severe. Sacral arcades appear intact.  There are five lumbar type vertebral bodies. Possible left L5 pars defect.  Severe L3-L4 degenerative disc disease with endplate sclerosis and spurring.  Lumbar vertebral body height appears preserved.  Lower thoracic vertebral body height also appears preserved.  IMPRESSION: 1.  No acute osseous abnormality. 2.  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 3.  Possible left L5 pars defects without spondylolisthesis.  Original Report Authenticated By: Andreas Newport, M.D.   Mr Cervical Spine Wo Contrast  06/27/2012  *RADIOLOGY REPORT*  Clinical Data: Cervical fracture and slip.  Possible ligamentous injury.  MRI CERVICAL SPINE WITHOUT CONTRAST  Technique:  Multiplanar and multiecho pulse sequences of the cervical spine, to include the craniocervical junction and cervicothoracic junction, were obtained according to standard protocol without intravenous contrast.  Comparison: Cervical radiographs 06/27/2012.  Findings: Extensive edematous changes are present in the posterior soft tissues and intraspinous ligament from the occiput through C4. The C3 and C4 spinous process fractures are again noted.  The anterolisthesis of C3-4 may be chronic as there is no significant abnormal signal in the disc spaces or paravertebral edema.  No acute to vertebral body fracture is evident.  Alignment is otherwise anatomic.  The craniocervical junction is within normal limits.  There is some fluid at the C1-2 and face.  The visualized intracranial contents are unremarkable.  C2-3:  Disc signal is preserved.  There is no significant disc herniation or stenosis.  C3-4:  A broad-based disc herniation is associated with the anterolisthesis.  There is some uncovertebral spurring bilaterally.  This effaces the ventral CSF.  Mild to moderate central and moderate right foraminal stenosis is present.  C4-5:  A broad-based disc osteophyte complex is present.  This effaces the ventral CSF and slightly distorts the ventral surface of the cord.  The foramina are patent bilaterally.  C5-6:  A broad-based disc osteophyte complex is present.  There is partial effacement of the ventral CSF. The foramina are patent bilaterally.  C6-7:  A broad-based disc osteophyte complex is present.  There is mild effacement of the ventral CSF.  Mild foraminal narrowing is evident.  C7-T1:  Negative.  IMPRESSION:  1.  Acute ligamentous injury at C2-3 and C3-4 within the intraspinous ligament. 2.  Additional edematous changes from the occiput through C4 in the paraspinal soft tissues. 3.  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  There is no paraspinal soft tissue edema.  The configuration is similar to the scout images from a CT scan of the head 12/18/2009.  4. Acute fractures of the spinous process at C3 and C4. 5.  This mild to moderate central and moderate right foraminal stenosis at C3-4. 6.  Mild to moderate central canal stenosis at C4-5. 7.  Mild central canal narrowing at C5-6 and C6-7.  Original Report Authenticated By: Jamesetta Orleans. MATTERN, M.D.     1. Multiple fractures of cervical spine, closed       MDM  All 2 days ago. Presentation with known cervical spine fractures. MRI is performed. Results as above. Patient was discussed with neurosurgery, who advised placement of an Aspen collar to be worn 24 7 and outpatient followup in the office. Patient will call the office tomorrow morning to schedule this appointment. Has tramadol at home for pain, does not wish to have any further pain medication prescribed.        Shaaron Adler, [place] 06/27/12 [phone]

## 2012-06-27 NOTE — ED Notes (Signed)
Pt from home with reports of a fall on Tuesday resulting in worsening pain from neck to lower back. Pt reports seeing PCP (Dr. Clarene Duke) this morning with xrays performed and was told by PCP to come to ED for possible cervical fractures.

## 2012-06-27 NOTE — ED Notes (Signed)
Off floor for testing 

## 2012-06-27 NOTE — ED Provider Notes (Signed)
History     CSN: [id number]  Arrival date & time 06/27/12  1141   First MD Initiated Contact with Patient 06/27/12 1234      Chief Complaint  Patient presents with  . Fall    ?c3/c4 fx    (Consider location/radiation/quality/duration/timing/severity/associated sxs/prior treatment) HPI  Past Medical History  Diagnosis Date  . Diabetes mellitus 1957  . Arthritis     Past Surgical History  Procedure Date  . Left ankle surgery     steel plate and 3 screws   . 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 Eye surgery     cataract surgery    History reviewed. No pertinent family history.  History  Substance Use Topics  . Smoking status: Never Smoker   . Smokeless tobacco: Never Used  . Alcohol Use: 0.6 oz/week    1 Glasses of wine per week    OB History    Grav Para Term Preterm Abortions TAB SAB Ect Mult Living                  Review of Systems  Allergies  Cefaclor; Tetracycline; and Vancomycin  Home Medications   Current Outpatient Rx  Name Route Sig Dispense Refill  . ALENDRONATE SODIUM 40 MG PO TABS Oral Take 40 mg by mouth every 7 (seven) days. Take with a full glass of water on an empty stomach every Wednesday morning.    Marland Kitchen VITAMIN C PO Oral Take 1 tablet by mouth daily at 12 noon.    . ASPIRIN 81 MG PO CHEW Oral Chew 81 mg by mouth at bedtime.    Marland Kitchen VITAMIN D 1000 UNITS PO TABS Oral Take 2,000 Units by mouth at bedtime.    . CHROMIUM 1000 MCG PO TABS Oral Take 1,000 mcg by mouth every morning.    Marland Kitchen CINNAMON PO Oral Take 1,000 mg by mouth 4 (four) times daily.    Marland Kitchen DICLOFENAC SODIUM 75 MG PO TBEC Oral Take 75 mg by mouth every morning.    Marland Kitchen FLAXSEED (LINSEED) 1000 MG PO CAPS Oral Take 1,000 mg by mouth 4 (four) times daily.    Marland Kitchen GLUCOSAMINE-CHONDROITIN 500-400 MG PO TABS Oral Take 1 tablet by mouth 2 (two) times daily at 10 am and 4 pm.    . INSULIN ASPART 100 UNIT/ML [place] SOLN Subcutaneous Inject 2-8 Units into the skin See admin instructions.  Sliding scale    . INSULIN GLARGINE 100 UNIT/ML [place] SOLN Subcutaneous Inject 18 Units into the skin every morning.     . ADULT MULTIVITAMIN W/MINERALS CH Oral Take 1 tablet by mouth every morning.    Marland Kitchen OMEGA 3 1200 MG PO CAPS Oral Take 1,200 mg by mouth 2 times daily at 12 noon and 4 pm.    . OVER THE COUNTER MEDICATION Topical Apply 1 application topically 4 (four) times daily. otc arth-rx roll on muscle rub cream    . QUINAPRIL HCL 40 MG PO TABS Oral Take 40 mg by mouth every morning.    Marland Kitchen SIMVASTATIN 40 MG PO TABS Oral Take 40 mg by mouth every evening.    Marland Kitchen TRAMADOL HCL 50 MG PO TABS Oral Take 50 mg by mouth 2 (two) times daily.     . TURMERIC PO Oral Take 1 capsule by mouth daily at 12 noon.      BP 117/42  Pulse 71  Temp 98.3 F (36.8 C) (Oral)  Resp 16  Ht 5\' 2"  (  1.575 m)  Wt 133 lb 8 oz (60.555 kg)  BMI 24.42 kg/m2  SpO2 98%  Physical Exam  ED Course  Procedures (including critical care time)  Labs Reviewed - No data to display Dg Thoracic Spine 2 View  06/27/2012  *RADIOLOGY REPORT*  Clinical Data: Fall 2 days ago.  Mid to low back pain.  THORACIC SPINE - 2 VIEW  Comparison: None.  Findings: Cervical fractures not well visualized on thoracic series.  Mild lower thoracic dextroconvex scoliosis, associated with S-shaped thoracolumbar scoliosis.  Vertebral body height is preserved.  Paraspinal lines appear within normal limits.  The cervicothoracic junction poorly visualized due to bony overlap. This can be evaluated further on MRI or CT.  IMPRESSION: No acute osseous abnormality of the thoracic spine.  Poor visualization of the cervicothoracic junction.  Original Report Authenticated By: Andreas Newport, M.D.   Dg Lumbar Spine Complete  06/27/2012  *RADIOLOGY REPORT*  Clinical Data: Fall.  Back pain.  LUMBAR SPINE - COMPLETE 4+ VIEW  Comparison: None.  Findings: Moderate levoconvex lumbar scoliosis with the apex at L3. Lateral slip of L3 on L4 measuring about 1 cm.  The scoliosis is  rotatory. Degenerative disease of the pubic symphysis is severe. Sacral arcades appear intact.  There are five lumbar type vertebral bodies. Possible left L5 pars defect.  Severe L3-L4 degenerative disc disease with endplate sclerosis and spurring.  Lumbar vertebral body height appears preserved.  Lower thoracic vertebral body height also appears preserved.  IMPRESSION: 1.  No acute osseous abnormality. 2.  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 3.  Possible left L5 pars defects without spondylolisthesis.  Original Report Authenticated By: Andreas Newport, M.D.     No diagnosis found.    MDM  Pt is being evaluated primarily by [facility].  I have also obtained an H&P.  She had a fall on 7/30 out of bed and has had neck pain since.  Imaging obtained today reported to demonstrate some c-spine fxs.  Plan MRI to further delineate any c-spine injuries.  Pt denies any arm or leg weakness/dyscoordination.        Tobin Chad, MD 06/27/12 1428

## 2012-06-27 NOTE — ED Notes (Signed)
Cervical collar applied

## 2012-06-27 NOTE — ED Notes (Signed)
Pt remains in MRI at this time  

## 2012-09-27 ENCOUNTER — Encounter (HOSPITAL_COMMUNITY): Payer: Self-pay

## 2012-09-27 ENCOUNTER — Emergency Department (HOSPITAL_COMMUNITY)
Admission: EM | Admit: 2012-09-27 | Discharge: 2012-09-28 | Disposition: A | Discharge status: Home / Self Care | Payer: Medicare Other | Attending: Emergency Medicine | Admitting: Emergency Medicine

## 2012-09-27 DIAGNOSIS — Z794 Long term (current) use of insulin: Secondary | ICD-10-CM | POA: Insufficient documentation

## 2012-09-27 DIAGNOSIS — IMO0001 Reserved for inherently not codable concepts without codable children: Secondary | ICD-10-CM

## 2012-09-27 DIAGNOSIS — E119 Type 2 diabetes mellitus without complications: Secondary | ICD-10-CM | POA: Insufficient documentation

## 2012-09-27 DIAGNOSIS — Z8781 Personal history of (healed) traumatic fracture: Secondary | ICD-10-CM | POA: Insufficient documentation

## 2012-09-27 DIAGNOSIS — D649 Anemia, unspecified: Secondary | ICD-10-CM | POA: Insufficient documentation

## 2012-09-27 DIAGNOSIS — Z791 Long term (current) use of non-steroidal anti-inflammatories (NSAID): Secondary | ICD-10-CM | POA: Insufficient documentation

## 2012-09-27 DIAGNOSIS — Z79899 Other long term (current) drug therapy: Secondary | ICD-10-CM | POA: Insufficient documentation

## 2012-09-27 DIAGNOSIS — Z7983 Long term (current) use of bisphosphonates: Secondary | ICD-10-CM | POA: Insufficient documentation

## 2012-09-27 DIAGNOSIS — M129 Arthropathy, unspecified: Secondary | ICD-10-CM | POA: Insufficient documentation

## 2012-09-27 DIAGNOSIS — Z7982 Long term (current) use of aspirin: Secondary | ICD-10-CM | POA: Insufficient documentation

## 2012-09-27 HISTORY — DX: Fracture of neck, unspecified, initial encounter: S12.9XXA

## 2012-09-27 LAB — CBC WITH DIFFERENTIAL/PLATELET
Basophils Absolute: 0.1 10*3/uL (ref 0.0–0.1)
Basophils Relative: 2 % — ABNORMAL HIGH (ref 0–1)
Eosinophils Absolute: 0.4 10*3/uL (ref 0.0–0.7)
Eosinophils Relative: 6 % — ABNORMAL HIGH (ref 0–5)
HCT: 32.7 % — ABNORMAL LOW (ref 36.0–46.0)
Hemoglobin: 11.2 g/dL — ABNORMAL LOW (ref 12.0–15.0)
Lymphocytes Relative: 32 % (ref 12–46)
Lymphs Abs: 2.3 10*3/uL (ref 0.7–4.0)
MCH: 31.1 pg (ref 26.0–34.0)
MCHC: 34.3 g/dL (ref 30.0–36.0)
MCV: 90.8 fL (ref 78.0–100.0)
Monocytes Absolute: 0.5 10*3/uL (ref 0.1–1.0)
Monocytes Relative: 7 % (ref 3–12)
Neutro Abs: 3.7 10*3/uL (ref 1.7–7.7)
Neutrophils Relative %: 53 % (ref 43–77)
Platelets: 311 10*3/uL (ref 150–400)
RBC: 3.6 MIL/uL — ABNORMAL LOW (ref 3.87–5.11)
RDW: 13 % (ref 11.5–15.5)
WBC: 7 10*3/uL (ref 4.0–10.5)

## 2012-09-27 LAB — BASIC METABOLIC PANEL
BUN: 26 mg/dL — ABNORMAL HIGH (ref 6–23)
CO2: 28 mEq/L (ref 19–32)
Calcium: 9.3 mg/dL (ref 8.4–10.5)
Chloride: 102 mEq/L (ref 96–112)
Creatinine, Ser: 1.02 mg/dL (ref 0.50–1.10)
GFR calc Af Amer: 63 mL/min — ABNORMAL LOW (ref >90)
GFR calc non Af Amer: 54 mL/min — ABNORMAL LOW (ref >90)
Glucose, Bld: 138 mg/dL — ABNORMAL HIGH (ref 70–99)
Potassium: 4.8 mEq/L (ref 3.5–5.1)
Sodium: 138 mEq/L (ref 135–145)

## 2012-09-27 NOTE — ED Notes (Signed)
Pt went to her PMD, Dr. Zachery Dauer for check-up.  Had labs drawn and was called back tonight and told to come here b/c of "unusually high K+ level"  Pt is not sure how high it was.  Pt has no complaints.

## 2012-09-27 NOTE — ED Provider Notes (Signed)
History     CSN: [id number]  Arrival date & time 09/27/12  2138   First MD Initiated Contact with Patient 09/27/12 2254      Chief Complaint  Patient presents with  . Abnormal Lab    (Consider location/radiation/quality/duration/timing/severity/associated sxs/prior treatment) HPI Patient had labs drawn for regular standing appointment at 1130, then told needed to repeat at office at 1600, then called and told it was still abnormal with high potassium and to come to ed.  Patient is not having palpitations, muscle aches, nausea, or vomiting.  Patient with episode of hyperkalemia last year.   pcp Catha Gosselin Past Medical History  Diagnosis Date  . Diabetes mellitus 1957  . Arthritis   . Neck fracture     july 2013    Past Surgical History  Procedure Date  . Left ankle surgery     steel plate and 3 screws   . 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 Eye surgery     cataract surgery    History reviewed. No pertinent family history.  History  Substance Use Topics  . Smoking status: Never Smoker   . Smokeless tobacco: Never Used  . Alcohol Use: 0.6 oz/week    1 Glasses of wine per week    OB History    Grav Para Term Preterm Abortions TAB SAB Ect Mult Living                  Review of Systems  Constitutional: Negative.   HENT: Negative.   Eyes: Negative.   Respiratory: Negative.   Cardiovascular: Negative.   Gastrointestinal: Negative.   Genitourinary: Negative.   Musculoskeletal: Negative.   Skin: Negative.   Neurological: Negative.   Hematological: Negative.   Psychiatric/Behavioral: Negative.     Allergies  Cefaclor; Tetracycline; and Vancomycin  Home Medications   Current Outpatient Rx  Name Route Sig Dispense Refill  . ALENDRONATE SODIUM 40 MG PO TABS Oral Take 40 mg by mouth every 7 (seven) days. Take with a full glass of water on an empty stomach every Wednesday morning.    Marland Kitchen VITAMIN C PO Oral Take 1 tablet by mouth daily at 12  noon.    . ASPIRIN 81 MG PO CHEW Oral Chew 81 mg by mouth at bedtime.    Marland Kitchen VITAMIN D 1000 UNITS PO TABS Oral Take 2,000 Units by mouth at bedtime.    . CHROMIUM 1000 MCG PO TABS Oral Take 1,000 mcg by mouth every morning.    Marland Kitchen CINNAMON PO Oral Take 1,000 mg by mouth 4 (four) times daily.    Marland Kitchen DICLOFENAC SODIUM 75 MG PO TBEC Oral Take 75 mg by mouth every morning.    Marland Kitchen FLAXSEED (LINSEED) 1000 MG PO CAPS Oral Take 1,000 mg by mouth 4 (four) times daily.    Marland Kitchen GLUCOSAMINE-CHONDROITIN 500-400 MG PO TABS Oral Take 1 tablet by mouth 2 (two) times daily at 10 am and 4 pm.    . INSULIN ASPART 100 UNIT/ML [place] SOLN Subcutaneous Inject 2-8 Units into the skin See admin instructions. Sliding scale. Base units = [place] below 80 subtract 1; 80-150 take 2 units (base dose); 151-200 add 1 unit; 201-250 add 2 units; 251-300 add 3 units; 301-350 add 4 units; 351-400 add 5 units; 401-450 add 6 units    . INSULIN GLARGINE 100 UNIT/ML [place] SOLN Subcutaneous Inject 20 Units into the skin every morning.     . ADULT MULTIVITAMIN W/MINERALS CH Oral  Take 1 tablet by mouth every morning.    Marland Kitchen OMEGA 3 1200 MG PO CAPS Oral Take 1,200 mg by mouth 2 times daily at 12 noon and 4 pm.    . OVER THE COUNTER MEDICATION Topical Apply 1 application topically 4 (four) times daily. otc arth-rx roll on muscle rub cream    . QUINAPRIL HCL 40 MG PO TABS Oral Take 40 mg by mouth every morning.    Marland Kitchen SIMVASTATIN 40 MG PO TABS Oral Take 40 mg by mouth every evening.    Marland Kitchen TRAMADOL HCL 50 MG PO TABS Oral Take 50 mg by mouth 2 (two) times daily.     . TURMERIC PO Oral Take 1 capsule by mouth daily at 12 noon.      BP 156/42  Pulse 81  Temp 98.4 F (36.9 C) (Oral)  Resp 18  SpO2 98%  Physical Exam  Nursing note and vitals reviewed. Constitutional: She is oriented to person, place, and time. She appears well-developed and well-nourished.  HENT:  Head: Normocephalic and atraumatic.  Eyes: Pupils are equal, round, and reactive to light.    Neck: Normal range of motion. Neck supple.  Cardiovascular: Normal rate, regular rhythm and normal heart sounds.   Pulmonary/Chest: Effort normal and breath sounds normal.  Abdominal: Soft. Bowel sounds are normal.  Musculoskeletal: Normal range of motion.  Neurological: She is alert and oriented to person, place, and time.  Skin: Skin is warm and dry.  Psychiatric: She has a normal mood and affect. Her behavior is normal. Thought content normal.    ED Course  Procedures (including critical care time)   Labs Reviewed  BASIC METABOLIC PANEL  CBC WITH DIFFERENTIAL   No results found.   No diagnosis found.  Results for orders placed during the hospital encounter of 09/27/12  BASIC METABOLIC PANEL      Component Value Range   Sodium 138  135 - 145 mEq/L   Potassium 4.8  3.5 - 5.1 mEq/L   Chloride 102  96 - 112 mEq/L   CO2 28  19 - 32 mEq/L   Glucose, Bld 138 (*) 70 - 99 mg/dL   BUN 26 (*) 6 - 23 mg/dL   Creatinine, Ser 1.61  0.50 - 1.10 mg/dL   Calcium 9.3  8.4 - 09.6 mg/dL   GFR calc non Af Amer 54 (*) >90 mL/min   GFR calc Af Amer 63 (*) >90 mL/min  CBC WITH DIFFERENTIAL      Component Value Range   WBC 7.0  4.0 - 10.5 K/uL   RBC 3.60 (*) 3.87 - 5.11 MIL/uL   Hemoglobin 11.2 (*) 12.0 - 15.0 g/dL   HCT 04.5 (*) 40.9 - 81.1 %   MCV 90.8  78.0 - 100.0 fL   MCH 31.1  26.0 - 34.0 pg   MCHC 34.3  30.0 - 36.0 g/dL   RDW 91.4  78.2 - 95.6 %   Platelets 311  150 - 400 K/uL   Neutrophils Relative 53  43 - 77 %   Neutro Abs 3.7  1.7 - 7.7 K/uL   Lymphocytes Relative 32  12 - 46 %   Lymphs Abs 2.3  0.7 - 4.0 K/uL   Monocytes Relative 7  3 - 12 %   Monocytes Absolute 0.5  0.1 - 1.0 K/uL   Eosinophils Relative 6 (*) 0 - 5 %   Eosinophils Absolute 0.4  0.0 - 0.7 K/uL   Basophils Relative 2 (*) 0 -  1 %   Basophils Absolute 0.1  0.0 - 0.1 K/uL      Date: 09/27/2012  Rate: 74  Rhythm: normal sinus rhythm  QRS Axis: normal  Intervals: normal  ST/T Wave abnormalities:  nonspecific ST changes  Conduction Disutrbances:q waves anterior leads  Narrative Interpretation:   Old EKG Reviewed: changes noted, q waves more pominent in anterior leads.   Patient with normal potassium now.  Plan follow up with pmd         Hilario Quarry, MD 09/27/12 2342

## 2012-09-27 NOTE — ED Notes (Signed)
EKG printed and given to EDP Zammit for review. Last confirmed printed for comparison

## 2012-11-13 ENCOUNTER — Encounter: Payer: Medicare Other | Attending: Endocrinology | Admitting: *Deleted

## 2012-11-13 DIAGNOSIS — Z713 Dietary counseling and surveillance: Secondary | ICD-10-CM | POA: Insufficient documentation

## 2012-11-13 DIAGNOSIS — E109 Type 1 diabetes mellitus without complications: Secondary | ICD-10-CM

## 2012-11-18 ENCOUNTER — Encounter: Payer: Self-pay | Admitting: *Deleted

## 2012-11-18 NOTE — Patient Instructions (Signed)
Plan: Continue with current insulin doses via injection per MD directions Consider contacting insulin pump company to determine cost of pump and supplies with your current insurance coverage Consider going to pump web-site for more information about pumps discussed today.

## 2012-11-18 NOTE — Progress Notes (Signed)
Introduction to Insulin Pump Therapy:  Appt start time: 1500 end time:  1600.  Assessment:  This patient has DM 1 and their primary concerns today: more information about insulin pumps.  This patient is interested in learning more about insulin pump therapy because she has had some hypoglycemic unawareness lateld  MEDICATIONS: Basal Insulin: 15 units of Lantus in AM via pen or syringe Bolus Insulin: 2 baseline plus sliding scale units of Novolog at each meal  via pen or syringe Total of insulin doses per day 30 Other diabetes medications: none  This patient is  currently adjusting bolus insulin based BG at a correction ratio of 1/50 mg/dl above target of 161 This patient is not currently adjusting bolus insulin based on carb intake  Patient states knowledge of Carb Counting is good  Last A1c was 6.9% on this date 10/16/12 Patient states complications from diabetes include none stated Patient states they forget to take their insulin injection on average 0 times per week Patient states they have had hypoglycemia 2-4 times in the past month Patient states their biggest barrier with diabetes is fluctuations of BG  Patient currently is not working, retired   Progress Towards Obtaining an Insulin PumpGoal(s):  In progress.  Patient states their expectations of pump therapy include: more accurate insulin delivery and fewer incidences of hypoglycemia Patient expresses understanding that for improved outcomes for their diabetes on an insulin pump they will:  Check BG 4+ times per day  Change out pump infusion set at least every 3 days  Upload pump information to software on a regular basis so provider can assess patterns and make setting adjustments.      Intervention:    Taught difference between delivery of insulin via syringe/pen compared to insulin pump.  Demonstrated improved insulin delivery via pump due to improved accuracy of dose and flexibility of adjusting bolus insulin  based on carb intake and BG correction.  Demonstrated pump, insulin reservoir and infusion set options, and button pushing for bolus delivery of insulin through the pump  Explained importance of testing BG at least 4 times per day for appropriate correction of high BG and prevention of DKA as applicable.  Emphasized importance of follow up after Pump Start for appropriate pump setting adjustments and on-going training on more advanced features.   Handouts given during visit include:  Insulin Pump Packet from [employer]  Description of transition from injections to insulin pump therapy  Monitoring/Evaluation:    Patient does  want to continue with pursuit of insulin pump.  Patient instructed to provide BG logs at 4x per day for the past 30 days due to insurance company requirements  Patient instructed to go to [employer] pump web-site to complete learning module on insulin pump and bring Certificate of Completion to next visit   prn.

## 2013-01-02 ENCOUNTER — Encounter (HOSPITAL_COMMUNITY): Payer: Self-pay | Admitting: Emergency Medicine

## 2013-01-02 ENCOUNTER — Observation Stay (HOSPITAL_COMMUNITY)
Admission: EM | Admit: 2013-01-02 | Discharge: 2013-01-03 | Disposition: A | Discharge status: Home / Self Care | Payer: Medicare Other | Attending: Internal Medicine | Admitting: Internal Medicine

## 2013-01-02 ENCOUNTER — Emergency Department (HOSPITAL_COMMUNITY): Payer: Medicare Other

## 2013-01-02 DIAGNOSIS — I779 Disorder of arteries and arterioles, unspecified: Secondary | ICD-10-CM

## 2013-01-02 DIAGNOSIS — Z794 Long term (current) use of insulin: Secondary | ICD-10-CM | POA: Insufficient documentation

## 2013-01-02 DIAGNOSIS — R4020 Unspecified coma: Secondary | ICD-10-CM

## 2013-01-02 DIAGNOSIS — R0989 Other specified symptoms and signs involving the circulatory and respiratory systems: Secondary | ICD-10-CM | POA: Insufficient documentation

## 2013-01-02 DIAGNOSIS — Z7902 Long term (current) use of antithrombotics/antiplatelets: Secondary | ICD-10-CM | POA: Insufficient documentation

## 2013-01-02 DIAGNOSIS — E109 Type 1 diabetes mellitus without complications: Secondary | ICD-10-CM

## 2013-01-02 DIAGNOSIS — R55 Syncope and collapse: Principal | ICD-10-CM | POA: Insufficient documentation

## 2013-01-02 DIAGNOSIS — Z79899 Other long term (current) drug therapy: Secondary | ICD-10-CM | POA: Insufficient documentation

## 2013-01-02 DIAGNOSIS — E042 Nontoxic multinodular goiter: Secondary | ICD-10-CM

## 2013-01-02 LAB — CBC WITH DIFFERENTIAL/PLATELET
Basophils Absolute: 0.1 10*3/uL (ref 0.0–0.1)
Basophils Relative: 1 % (ref 0–1)
Eosinophils Absolute: 0.3 10*3/uL (ref 0.0–0.7)
Eosinophils Relative: 4 % (ref 0–5)
HCT: 32.3 % — ABNORMAL LOW (ref 36.0–46.0)
Hemoglobin: 11.1 g/dL — ABNORMAL LOW (ref 12.0–15.0)
Lymphocytes Relative: 21 % (ref 12–46)
Lymphs Abs: 1.7 10*3/uL (ref 0.7–4.0)
MCH: 31.3 pg (ref 26.0–34.0)
MCHC: 34.4 g/dL (ref 30.0–36.0)
MCV: 91 fL (ref 78.0–100.0)
Monocytes Absolute: 0.4 10*3/uL (ref 0.1–1.0)
Monocytes Relative: 5 % (ref 3–12)
Neutro Abs: 5.6 10*3/uL (ref 1.7–7.7)
Neutrophils Relative %: 70 % (ref 43–77)
Platelets: 271 10*3/uL (ref 150–400)
RBC: 3.55 MIL/uL — ABNORMAL LOW (ref 3.87–5.11)
RDW: 13 % (ref 11.5–15.5)
WBC: 8 10*3/uL (ref 4.0–10.5)

## 2013-01-02 LAB — GLUCOSE, CAPILLARY
Glucose-Capillary: 229 mg/dL — ABNORMAL HIGH (ref 70–99)
Glucose-Capillary: 233 mg/dL — ABNORMAL HIGH (ref 70–99)
Glucose-Capillary: 213 mg/dL — ABNORMAL HIGH (ref 70–99)

## 2013-01-02 LAB — COMPREHENSIVE METABOLIC PANEL
ALT: 15 U/L (ref 0–35)
AST: 18 U/L (ref 0–37)
Albumin: 4 g/dL (ref 3.5–5.2)
Alkaline Phosphatase: 67 U/L (ref 39–117)
BUN: 18 mg/dL (ref 6–23)
CO2: 30 mEq/L (ref 19–32)
Calcium: 9 mg/dL (ref 8.4–10.5)
Chloride: 101 mEq/L (ref 96–112)
Creatinine, Ser: 0.85 mg/dL (ref 0.50–1.10)
GFR calc Af Amer: 79 mL/min — ABNORMAL LOW (ref >90)
GFR calc non Af Amer: 68 mL/min — ABNORMAL LOW (ref >90)
Glucose, Bld: 217 mg/dL — ABNORMAL HIGH (ref 70–99)
Potassium: 4.7 mEq/L (ref 3.5–5.1)
Sodium: 138 mEq/L (ref 135–145)
Total Bilirubin: 0.1 mg/dL — ABNORMAL LOW (ref 0.3–1.2)
Total Protein: 6.7 g/dL (ref 6.0–8.3)

## 2013-01-02 LAB — URINALYSIS, ROUTINE W REFLEX MICROSCOPIC
Bilirubin Urine: NEGATIVE
Glucose, UA: NEGATIVE mg/dL
Hgb urine dipstick: NEGATIVE
Ketones, ur: NEGATIVE mg/dL
Leukocytes, UA: NEGATIVE
Nitrite: NEGATIVE
Protein, ur: NEGATIVE mg/dL
Specific Gravity, Urine: 1.016 (ref 1.005–1.030)
Urobilinogen, UA: 0.2 mg/dL (ref 0.0–1.0)
pH: 7 (ref 5.0–8.0)

## 2013-01-02 LAB — TROPONIN I: Troponin I: <0.3 ng/mL (ref <0.30)

## 2013-01-02 MED ORDER — ONDANSETRON HCL 4 MG/2ML IJ SOLN
4.0000 mg | Freq: Four times a day (QID) | INTRAMUSCULAR | Status: DC | PRN
  Administered 2013-01-03: 4 mg via INTRAVENOUS
  Filled 2013-01-02 (×2): qty 2

## 2013-01-02 MED ORDER — SODIUM CHLORIDE 0.9 % IV SOLN
250.0000 mL | INTRAVENOUS | Status: DC | PRN
  Administered 2013-01-03: 250 mL via INTRAVENOUS

## 2013-01-02 MED ORDER — ONDANSETRON HCL 4 MG PO TABS: 4.0000 mg | ORAL_TABLET | Freq: Four times a day (QID) | ORAL | Status: DC | PRN

## 2013-01-02 MED ORDER — SODIUM CHLORIDE 0.9 % IJ SOLN: 3.0000 mL | Freq: Two times a day (BID) | INTRAMUSCULAR | Status: DC

## 2013-01-02 MED ORDER — DICLOFENAC SODIUM 75 MG PO TBEC
75.0000 mg | DELAYED_RELEASE_TABLET | Freq: Every morning | ORAL | Status: DC
  Administered 2013-01-03: 75 mg via ORAL
  Filled 2013-01-02: qty 1

## 2013-01-02 MED ORDER — ACETAMINOPHEN 325 MG PO TABS: 650.0000 mg | ORAL_TABLET | Freq: Four times a day (QID) | ORAL | Status: DC | PRN

## 2013-01-02 MED ORDER — ASPIRIN 81 MG PO CHEW
81.0000 mg | CHEWABLE_TABLET | Freq: Every day | ORAL | Status: DC
  Administered 2013-01-02: 81 mg via ORAL
  Filled 2013-01-02: qty 1

## 2013-01-02 MED ORDER — TRAMADOL HCL 50 MG PO TABS
50.0000 mg | ORAL_TABLET | Freq: Every day | ORAL | Status: DC
  Administered 2013-01-02: 50 mg via ORAL
  Filled 2013-01-02: qty 1

## 2013-01-02 MED ORDER — SODIUM CHLORIDE 0.9 % IJ SOLN
3.0000 mL | INTRAMUSCULAR | Status: DC | PRN
  Administered 2013-01-02: 3 mL via INTRAVENOUS

## 2013-01-02 MED ORDER — SIMVASTATIN 40 MG PO TABS
40.0000 mg | ORAL_TABLET | Freq: Every day | ORAL | Status: DC
  Administered 2013-01-02 – 2013-01-03 (×2): 40 mg via ORAL
  Filled 2013-01-02 (×2): qty 1

## 2013-01-02 MED ORDER — CYCLOBENZAPRINE HCL 5 MG PO TABS
5.0000 mg | ORAL_TABLET | Freq: Three times a day (TID) | ORAL | Status: DC | PRN
  Administered 2013-01-02 – 2013-01-03 (×2): 5 mg via ORAL
  Filled 2013-01-02 (×2): qty 1

## 2013-01-02 MED ORDER — ACETAMINOPHEN 650 MG RE SUPP: 650.0000 mg | Freq: Four times a day (QID) | RECTAL | Status: DC | PRN

## 2013-01-02 MED ORDER — INSULIN GLARGINE 100 UNIT/ML ~~LOC~~ SOLN
15.0000 [IU] | Freq: Every morning | SUBCUTANEOUS | Status: DC
  Administered 2013-01-03: 15 [IU] via SUBCUTANEOUS

## 2013-01-02 MED ORDER — INSULIN ASPART 100 UNIT/ML ~~LOC~~ SOLN
2.0000 [IU] | Freq: Three times a day (TID) | SUBCUTANEOUS | Status: DC
  Administered 2013-01-03: 2 [IU] via SUBCUTANEOUS
  Administered 2013-01-03: 7 [IU] via SUBCUTANEOUS

## 2013-01-02 MED ORDER — QUINAPRIL HCL 10 MG PO TABS
40.0000 mg | ORAL_TABLET | Freq: Every morning | ORAL | Status: DC
  Filled 2013-01-02: qty 4

## 2013-01-02 NOTE — ED Notes (Signed)
Shortly after pt was brought to ED pt was witnessed by another RN,  Franchot Mimes, RN) sitting on stretcher eating snacks from her pocketbook.
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
## 2013-01-02 NOTE — ED Notes (Signed)
Pt ambulatory to bathroom without any problems 

## 2013-01-02 NOTE — ED Notes (Signed)
Pt also st's she checked her own CBG and it was 255 so she gave herself Novolog 4 units.  Instructed pt not to medicate herself to let someone know if she needed anything
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
## 2013-01-02 NOTE — ED Notes (Signed)
Cardiologist is Dr.Smith

## 2013-01-03 DIAGNOSIS — R55 Syncope and collapse: Secondary | ICD-10-CM

## 2013-01-03 DIAGNOSIS — I369 Nonrheumatic tricuspid valve disorder, unspecified: Secondary | ICD-10-CM

## 2013-01-03 LAB — GLUCOSE, CAPILLARY
Glucose-Capillary: 64 mg/dL — ABNORMAL LOW (ref 70–99)
Glucose-Capillary: 371 mg/dL — ABNORMAL HIGH (ref 70–99)
Glucose-Capillary: 133 mg/dL — ABNORMAL HIGH (ref 70–99)

## 2013-01-03 LAB — TROPONIN I: Troponin I: <0.3 ng/mL (ref <0.30)

## 2013-01-03 MED ORDER — LISINOPRIL 40 MG PO TABS
40.0000 mg | ORAL_TABLET | Freq: Every day | ORAL | Status: DC
  Administered 2013-01-03: 40 mg via ORAL
  Filled 2013-01-03 (×2): qty 1

## 2013-01-03 MED ORDER — INSULIN GLARGINE 100 UNIT/ML ~~LOC~~ SOLN: 15.0000 [IU] | Freq: Every day | SUBCUTANEOUS | Status: DC

## 2013-01-03 NOTE — Progress Notes (Signed)
Inpatient Diabetes Program Recommendations  AACE/ADA: New Consensus Statement on Inpatient Glycemic Control (2013)  Target Ranges:  Prepandial:   less than 140 mg/dL      Peak postprandial:   less than 180 mg/dL (1-2 hours)      Critically ill patients:  140 - 180 mg/dL  Results for Danielle Clarke, Danielle Clarke (MRN [id number]) as of 01/03/2013 16:28  Ref. Range 01/02/2013 18:48 01/02/2013 20:26 01/03/2013 07:27 01/03/2013 11:30 01/03/2013 16:01  Glucose-Capillary Latest Range: 70-99 mg/dL 409 (H) 811 (H) 64 (L) 371 (H) 133 (H)   Inpatient Diabetes Program Recommendations Correction (SSI): change to SENSITIVE scale TID  Insulin - Meal Coverage: add Novolog 5 units TID per Glycemic Control order set (not to be given if eats <50% ) Too much fluctuation in CBGs.  Patient is being seen at [facility] and is in process of preparing to start insulin pump therapy. Thank you  Piedad Climes BSN, RN,CDE Inpatient Diabetes Coordinator [phone] (team pager)

## 2013-01-03 NOTE — Progress Notes (Signed)
*  PRELIMINARY RESULTS* Vascular Ultrasound Carotid Duplex (Doppler) has been completed.   The left internal carotid artery demonstrates elevated velocities suggestive of a stenosis <40%. The right internal carotid artery demonstrates elevated velocities, however there is no significant plaque morphology to support stenosis. The left external carotid artery demonstrates elevated velocities suggestive of stenosis. Vertebral arteries are patent with antegrade flow.  01/03/2013 4:27 PM Gertie Fey, RDMS, RDCS

## 2013-01-03 NOTE — Care Management Note (Unsigned)
    Page 1 of 1   01/03/2013     4:13:41 PM   CARE MANAGEMENT NOTE 01/03/2013  Patient:  Danielle Clarke, Danielle Clarke   Account Number:  [id number]  Date Initiated:  01/03/2013  Documentation initiated by:  GRAVES-BIGELOW,Suan Pyeatt  Subjective/Objective Assessment:   Pt admitted with syncope.     Action/Plan:   CM will continue to monitor for disposition needs.   Anticipated DC Date:  01/04/2013   Anticipated DC Plan:  HOME/SELF CARE         Choice offered to / List presented to:             Status of service:  In process, will continue to follow Medicare Important Message given?   (If response is "NO", the following Medicare IM given date fields will be blank) Date Medicare IM given:   Date Additional Medicare IM given:    Discharge Disposition:    Per UR Regulation:  Reviewed for med. necessity/level of care/duration of stay  If discussed at Long Length of Stay Meetings, dates discussed:    Comments:
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
## 2013-01-03 NOTE — Progress Notes (Signed)
  Echocardiogram 2D Echocardiogram has been performed.  Cathie Beams 01/03/2013, 2:48 PM
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
## 2013-01-03 NOTE — Progress Notes (Signed)
Inpatient Diabetes Program Recommendations  AACE/ADA: New Consensus Statement on Inpatient Glycemic Control (2013)  Target Ranges:  Prepandial:   less than 140 mg/dL      Peak postprandial:   less than 180 mg/dL (1-2 hours)      Critically ill patients:  140 - 180 mg/dL  Results for MIKAEL, DEBELL (MRN [id number]) as of 01/03/2013 11:18  Ref. Range 01/02/2013 17:57 01/02/2013 18:48 01/02/2013 20:26 01/03/2013 07:27  Glucose-Capillary Latest Range: 70-99 mg/dL 629 (H) 528 (H) 413 (H) 64 (L)   Inpatient Diabetes Program Recommendations Correction (SSI): May need a reduction of scale during hospitalization and add meal coverage separately if her correction factor is 1:50 as stated in H&P notes by Bev Paddock at [facility] Insulin - Meal Coverage: May need addition of meal coverage Thank you  Piedad Climes BSN, RN,CDE Inpatient Diabetes Coordinator [phone] (team pager)

## 2013-01-08 ENCOUNTER — Other Ambulatory Visit: Payer: Self-pay | Admitting: Gynecology

## 2013-03-18 ENCOUNTER — Other Ambulatory Visit: Payer: Self-pay | Admitting: Family Medicine

## 2013-03-18 DIAGNOSIS — M545 Low back pain, unspecified: Secondary | ICD-10-CM

## 2013-03-18 DIAGNOSIS — M79605 Pain in left leg: Secondary | ICD-10-CM

## 2013-03-19 ENCOUNTER — Ambulatory Visit
Admission: RE | Admit: 2013-03-19 | Discharge: 2013-03-19 | Disposition: A | Discharge status: Home / Self Care | Payer: Medicare Other | Source: Ambulatory Visit | Attending: Family Medicine | Admitting: Family Medicine

## 2013-03-19 DIAGNOSIS — M545 Low back pain, unspecified: Secondary | ICD-10-CM

## 2013-03-19 DIAGNOSIS — M79605 Pain in left leg: Secondary | ICD-10-CM

## 2013-03-26 DIAGNOSIS — Z0289 Encounter for other administrative examinations: Secondary | ICD-10-CM

## 2013-03-27 ENCOUNTER — Telehealth: Payer: Self-pay | Admitting: Diagnostic Neuroimaging
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
## 2013-04-08 ENCOUNTER — Encounter (HOSPITAL_COMMUNITY): Payer: Self-pay | Admitting: Emergency Medicine

## 2013-04-08 ENCOUNTER — Emergency Department (HOSPITAL_COMMUNITY): Payer: Medicare Other

## 2013-04-08 ENCOUNTER — Emergency Department (HOSPITAL_COMMUNITY)
Admission: EM | Admit: 2013-04-08 | Discharge: 2013-04-08 | Disposition: A | Discharge status: Home / Self Care | Payer: Medicare Other | Attending: Emergency Medicine | Admitting: Emergency Medicine

## 2013-04-08 DIAGNOSIS — E1169 Type 2 diabetes mellitus with other specified complication: Secondary | ICD-10-CM | POA: Insufficient documentation

## 2013-04-08 DIAGNOSIS — R197 Diarrhea, unspecified: Secondary | ICD-10-CM | POA: Insufficient documentation

## 2013-04-08 DIAGNOSIS — R1110 Vomiting, unspecified: Secondary | ICD-10-CM | POA: Insufficient documentation

## 2013-04-08 DIAGNOSIS — R739 Hyperglycemia, unspecified: Secondary | ICD-10-CM

## 2013-04-08 DIAGNOSIS — E875 Hyperkalemia: Secondary | ICD-10-CM | POA: Insufficient documentation

## 2013-04-08 DIAGNOSIS — Z79899 Other long term (current) drug therapy: Secondary | ICD-10-CM | POA: Insufficient documentation

## 2013-04-08 DIAGNOSIS — Z794 Long term (current) use of insulin: Secondary | ICD-10-CM | POA: Insufficient documentation

## 2013-04-08 DIAGNOSIS — Z8739 Personal history of other diseases of the musculoskeletal system and connective tissue: Secondary | ICD-10-CM | POA: Insufficient documentation

## 2013-04-08 DIAGNOSIS — Z8781 Personal history of (healed) traumatic fracture: Secondary | ICD-10-CM | POA: Insufficient documentation

## 2013-04-08 DIAGNOSIS — M5432 Sciatica, left side: Secondary | ICD-10-CM

## 2013-04-08 DIAGNOSIS — M5430 Sciatica, unspecified side: Secondary | ICD-10-CM | POA: Insufficient documentation

## 2013-04-08 DIAGNOSIS — R079 Chest pain, unspecified: Secondary | ICD-10-CM | POA: Insufficient documentation

## 2013-04-08 DIAGNOSIS — T380X5A Adverse effect of glucocorticoids and synthetic analogues, initial encounter: Secondary | ICD-10-CM | POA: Insufficient documentation

## 2013-04-08 DIAGNOSIS — Z7982 Long term (current) use of aspirin: Secondary | ICD-10-CM | POA: Insufficient documentation

## 2013-04-08 DIAGNOSIS — Z789 Other specified health status: Secondary | ICD-10-CM

## 2013-04-08 DIAGNOSIS — E871 Hypo-osmolality and hyponatremia: Secondary | ICD-10-CM | POA: Insufficient documentation

## 2013-04-08 LAB — CBC WITH DIFFERENTIAL/PLATELET
HCT: 34.9 % — ABNORMAL LOW (ref 36.0–46.0)
Hemoglobin: 11.7 g/dL — ABNORMAL LOW (ref 12.0–15.0)
MCH: 30.3 pg (ref 26.0–34.0)
MCHC: 33.5 g/dL (ref 30.0–36.0)
MCV: 90.4 fL (ref 78.0–100.0)
Platelets: 336 10*3/uL (ref 150–400)
RBC: 3.86 MIL/uL — ABNORMAL LOW (ref 3.87–5.11)
RDW: 13.7 % (ref 11.5–15.5)
WBC: 25.9 10*3/uL — ABNORMAL HIGH (ref 4.0–10.5)

## 2013-04-08 LAB — GLUCOSE, CAPILLARY
Glucose-Capillary: 567 mg/dL (ref 70–99)
Glucose-Capillary: 465 mg/dL — ABNORMAL HIGH (ref 70–99)
Glucose-Capillary: 341 mg/dL — ABNORMAL HIGH (ref 70–99)
Glucose-Capillary: 267 mg/dL — ABNORMAL HIGH (ref 70–99)
Glucose-Capillary: 151 mg/dL — ABNORMAL HIGH (ref 70–99)

## 2013-04-08 LAB — URINALYSIS, ROUTINE W REFLEX MICROSCOPIC
Bilirubin Urine: NEGATIVE
Glucose, UA: >1000 mg/dL — AB
Hgb urine dipstick: NEGATIVE
Ketones, ur: 15 mg/dL — AB
Leukocytes, UA: NEGATIVE
Nitrite: NEGATIVE
Protein, ur: NEGATIVE mg/dL
Specific Gravity, Urine: 1.026 (ref 1.005–1.030)
Urobilinogen, UA: 0.2 mg/dL (ref 0.0–1.0)
pH: 6 (ref 5.0–8.0)

## 2013-04-08 LAB — POCT I-STAT TROPONIN I: Troponin i, poc: 0 ng/mL (ref 0.00–0.08)

## 2013-04-08 LAB — URINE MICROSCOPIC-ADD ON

## 2013-04-08 LAB — COMPREHENSIVE METABOLIC PANEL
ALT: 19 U/L (ref 0–35)
AST: 20 U/L (ref 0–37)
Albumin: 4 g/dL (ref 3.5–5.2)
Alkaline Phosphatase: 84 U/L (ref 39–117)
BUN: 28 mg/dL — ABNORMAL HIGH (ref 6–23)
CO2: 22 mEq/L (ref 19–32)
Calcium: 10.3 mg/dL (ref 8.4–10.5)
Chloride: 93 mEq/L — ABNORMAL LOW (ref 96–112)
Creatinine, Ser: 1.05 mg/dL (ref 0.50–1.10)
GFR calc Af Amer: 61 mL/min — ABNORMAL LOW (ref >90)
GFR calc non Af Amer: 53 mL/min — ABNORMAL LOW (ref >90)
Glucose, Bld: 624 mg/dL (ref 70–99)
Potassium: 5.2 mEq/L — ABNORMAL HIGH (ref 3.5–5.1)
Sodium: 130 mEq/L — ABNORMAL LOW (ref 135–145)
Total Bilirubin: 0.5 mg/dL (ref 0.3–1.2)
Total Protein: 7.3 g/dL (ref 6.0–8.3)

## 2013-04-08 LAB — D-DIMER, QUANTITATIVE (NOT AT ARMC): D-Dimer, Quant: 0.45 ug/mL-FEU (ref 0.00–0.48)

## 2013-04-08 LAB — CG4 I-STAT (LACTIC ACID): Lactic Acid, Venous: 2.19 mmol/L (ref 0.5–2.2)

## 2013-04-08 MED ORDER — SODIUM CHLORIDE 0.9 % IV SOLN
1000.0000 mL | INTRAVENOUS | Status: DC
  Administered 2013-04-08: 1000 mL via INTRAVENOUS

## 2013-04-08 MED ORDER — GI COCKTAIL ~~LOC~~
30.0000 mL | Freq: Once | ORAL | Status: AC
  Administered 2013-04-08: 30 mL via ORAL
  Filled 2013-04-08: qty 30

## 2013-04-08 MED ORDER — SODIUM CHLORIDE 0.9 % IV SOLN
1000.0000 mL | Freq: Once | INTRAVENOUS | Status: AC
  Administered 2013-04-08: 1000 mL via INTRAVENOUS

## 2013-04-08 MED ORDER — KETOROLAC TROMETHAMINE 30 MG/ML IJ SOLN
30.0000 mg | Freq: Once | INTRAMUSCULAR | Status: AC
  Administered 2013-04-08: 30 mg via INTRAVENOUS
  Filled 2013-04-08: qty 1

## 2013-04-08 MED ORDER — ONDANSETRON HCL 4 MG/2ML IJ SOLN
INTRAMUSCULAR | Status: AC
  Administered 2013-04-08: 4 mg
  Filled 2013-04-08: qty 2

## 2013-04-08 MED ORDER — DEXTROSE-NACL 5-0.45 % IV SOLN: INTRAVENOUS | Status: DC

## 2013-04-08 MED ORDER — INSULIN REGULAR HUMAN 100 UNIT/ML IJ SOLN
INTRAMUSCULAR | Status: DC
  Filled 2013-04-08: qty 1

## 2013-04-08 MED ORDER — KETOROLAC TROMETHAMINE 10 MG PO TABS: 10.0000 mg | ORAL_TABLET | Freq: Four times a day (QID) | ORAL | Status: DC | PRN

## 2013-04-08 MED ORDER — SODIUM CHLORIDE 0.9 % IV SOLN
INTRAVENOUS | Status: DC
  Administered 2013-04-08: 14:00:00 via INTRAVENOUS

## 2013-04-08 MED ORDER — DEXTROSE 50 % IV SOLN: 25.0000 mL | INTRAVENOUS | Status: DC | PRN

## 2013-04-08 MED ORDER — INSULIN REGULAR BOLUS VIA INFUSION
0.0000 [IU] | Freq: Three times a day (TID) | INTRAVENOUS | Status: DC
  Filled 2013-04-08: qty 10

## 2013-04-08 NOTE — ED Notes (Signed)
Notified RN of CBG [id number]
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
## 2013-04-08 NOTE — ED Notes (Signed)
Notified RN of CBG [id number]

## 2013-04-08 NOTE — ED Notes (Signed)
Patient discharge with family. Discharged using teach back method patient verbalized an understanding.

## 2013-04-08 NOTE — ED Notes (Signed)
Diabetic Diet ordered spoke to [employer]

## 2013-04-08 NOTE — ED Notes (Signed)
Notified RN of CBG [phone]

## 2013-04-08 NOTE — ED Notes (Signed)
Notified RN of CBG [phone]

## 2013-04-08 NOTE — ED Notes (Signed)
Dr. Effie Shy notified face to face of pt's D-dimer 0.45.

## 2013-04-08 NOTE — ED Notes (Signed)
Patient received a Carb Modified diet tray.
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
## 2013-04-08 NOTE — ED Provider Notes (Addendum)
History     CSN: [id number]  Arrival date & time 04/08/13  6578   First MD Initiated Contact with Patient 04/08/13 [phone]      Chief Complaint  Patient presents with  . Chest Pain    (Consider location/radiation/quality/duration/timing/severity/associated sxs/prior treatment) HPI Comments: Danielle Clarke is a 72 y.o. female who is here for evaluation of chest pain. The chest pain is a "knot-like feeling", in the center of her chest. That started at 4 AM today, it is persistent. At the worst, the pain was 10, it is now out of 10. She took antacids at home with her short relief. EMS was called to her home and they gave her aspirin and a single sublingual nitroglycerin. She had transient lowering of her blood pressure, but no change in her pain, with the nitroglycerin. She has been ill for 20 hours with vomiting and diarrhea, since starting a prednisone taper pack, yesterday. This was as treatment for left sciatica. The sciatica has been present for several months. She has also had intermittently high blood sugars for several months. Yesterday. She had to use about 35 units and sliding scale Humalog, because her CBGs were in the 200-400 range. She denies shortness of breath, fever, or chills, abdominal pain, back pain, dizziness, change in bowel or urinary habits.   Patient is a 72 y.o. female presenting with chest pain. The history is provided by the patient.  Chest Pain   Past Medical History  Diagnosis Date  . Diabetes mellitus 1957  . Arthritis   . Neck fracture     july 2013    Past Surgical History  Procedure Laterality Date  . Left ankle surgery      steel plate and 3 screws   . 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 Eye surgery      cataract surgery    Family History  Problem Relation Age of Onset  . Cancer Mother     History  Substance Use Topics  . Smoking status: Never Smoker   . Smokeless tobacco: Never Used  . Alcohol Use: 0.6 oz/week    1  Glasses of wine per week    OB History   Grav Para Term Preterm Abortions TAB SAB Ect Mult Living                  Review of Systems  Cardiovascular: Positive for chest pain.  All other systems reviewed and are negative.    Allergies  Cefaclor; Tetracycline; and Vancomycin  Home Medications   Current Outpatient Rx  Name  Route  Sig  Dispense  Refill  . Ascorbic Acid (VITAMIN C PO)   Oral   Take 1 tablet by mouth daily at 12 noon.         Marland Kitchen aspirin EC 81 MG tablet   Oral   Take 81 mg by mouth daily.         . cholecalciferol (VITAMIN D) 1000 UNITS tablet   Oral   Take 2,000 Units by mouth at bedtime.         . Chromium 1000 MCG TABS   Oral   Take 1,000 mcg by mouth every morning.         Marland Kitchen CINNAMON PO   Oral   Take 1,000 mg by mouth 4 (four) times daily.         . diclofenac (VOLTAREN) 75 MG EC tablet   Oral   Take 75  mg by mouth every morning.         . Flaxseed, Linseed, 1000 MG CAPS   Oral   Take 1,000 mg by mouth 4 (four) times daily.         . Glucosamine HCl-MSM (GLUCOSAMINE-MSM PO)   Oral   Take 1 tablet by mouth 2 (two) times daily. 1500mg          . insulin aspart (NOVOLOG) 100 UNIT/ML injection   Subcutaneous   Inject 2-8 Units into the skin See admin instructions. Sliding scale. Base units = Reading below 80 subtract 1; 80-150 take 2 units (base dose); 151-200 add 1 unit; 201-250 add 2 units; 251-300 add 3 units; 301-350 add 4 units; 351-400 add 5 units; 401-450 add 6 units         . insulin glargine (LANTUS) 100 UNIT/ML injection   Subcutaneous   Inject 12 Units into the skin every morning.          . Multiple Vitamin (MULTIVITAMIN WITH MINERALS) TABS   Oral   Take 1 tablet by mouth every morning.         Marland Kitchen OMEGA 3 1200 MG CAPS   Oral   Take 1,200 mg by mouth 3 (three) times daily.          . quinapril (ACCUPRIL) 40 MG tablet   Oral   Take 40 mg by mouth every morning.         . simvastatin (ZOCOR) 40 MG  tablet   Oral   Take 40 mg by mouth every evening.         . traMADol (ULTRAM) 50 MG tablet   Oral   Take 100 mg by mouth at bedtime.          . TURMERIC PO   Oral   Take 1 capsule by mouth daily at 12 noon.         Marland Kitchen alendronate (FOSAMAX) 40 MG tablet   Oral   Take 40 mg by mouth every 7 (seven) days. Take with a full glass of water on an empty stomach every Wednesday morning. If pt's blood sugar is in good condition she takes.         Marland Kitchen ketorolac (TORADOL) 10 MG tablet   Oral   Take 1 tablet (10 mg total) by mouth every 6 (six) hours as needed for pain.   20 tablet   0     BP 146/38  Pulse 77  Temp(Src) 97.9 F (36.6 C) (Oral)  Resp 17  SpO2 100%  Physical Exam  Nursing note and vitals reviewed. Constitutional: She is oriented to person, place, and time. She appears well-developed and well-nourished.  HENT:  Head: Normocephalic and atraumatic.  Eyes: Conjunctivae and EOM are normal. Pupils are equal, round, and reactive to light.  Neck: Normal range of motion and phonation normal. Neck supple.  Cardiovascular: Normal rate, regular rhythm and intact distal pulses.   Pulmonary/Chest: Effort normal and breath sounds normal. No respiratory distress. She has no wheezes. She exhibits no tenderness.  Abdominal: Soft. She exhibits no distension. There is no tenderness. There is no guarding.  Musculoskeletal: She exhibits tenderness (left buttocks).  Neurological: She is alert and oriented to person, place, and time. She has normal strength. She exhibits normal muscle tone.  Skin: Skin is warm and dry.  Psychiatric: She has a normal mood and affect. Her behavior is normal. Judgment and thought content normal.    ED Course  Procedures (including critical care  time)   Medications  0.9 %  sodium chloride infusion (0 mLs Intravenous Stopped 04/08/13 1406)  insulin regular bolus via infusion 0-10 Units (not administered)  insulin regular (NOVOLIN R,HUMULIN R) 1  Units/mL in sodium chloride 0.9 % 100 mL infusion (4.1 Units/hr Intravenous Rate/Dose Change 04/08/13 1403)  dextrose 50 % solution 25 mL (not administered)  0.9 %  sodium chloride infusion ( Intravenous New Bag/Given 04/08/13 1403)  dextrose 5 %-0.45 % sodium chloride infusion (not administered)  0.9 %  sodium chloride infusion (0 mLs Intravenous Stopped 04/08/13 1122)  gi cocktail (Maalox,Lidocaine,Donnatal) (30 mLs Oral Given 04/08/13 1000)  ondansetron (ZOFRAN) 4 MG/2ML injection (4 mg  Given 04/08/13 1005)  ketorolac (TORADOL) 30 MG/ML injection 30 mg (30 mg Intravenous Given 04/08/13 1434)    Patient Vitals for the past 24 hrs:  BP Temp Temp src Pulse Resp SpO2  04/08/13 1408 146/38 mmHg - - 77 17 100 %  04/08/13 1330 150/40 mmHg - - 85 16 96 %  04/08/13 1315 144/44 mmHg - - 83 17 93 %  04/08/13 1300 139/39 mmHg - - 79 22 99 %  04/08/13 1245 136/39 mmHg - - 77 13 99 %  04/08/13 1230 141/46 mmHg - - 84 13 99 %  04/08/13 1215 150/47 mmHg - - 80 13 100 %  04/08/13 1200 151/43 mmHg - - 85 19 100 %  04/08/13 1145 151/49 mmHg - - 84 14 100 %  04/08/13 1130 158/49 mmHg - - 85 17 98 %  04/08/13 1100 186/53 mmHg - - 95 - 100 %  04/08/13 1045 164/49 mmHg - - 91 - 97 %  04/08/13 1030 180/64 mmHg - - 91 - 95 %  04/08/13 1015 183/56 mmHg - - 93 - 95 %  04/08/13 1000 182/56 mmHg - - 96 - 97 %  04/08/13 0855 163/46 mmHg 97.9 F (36.6 C) Oral 94 16 99 %    Laboratory highlights ; noted do to lab, down time : Lactate minimally elevated 2.2, WBC high 25, urinalysis is normal , glucose is high at 624,    Date: 04/08/13  Rate: 94  Rhythm: normal sinus rhythm  QRS Axis: normal  PR and QT Intervals: normal  ST/T Wave abnormalities: normal  PR and QRS Conduction Disutrbances:none  Narrative Interpretation: Poor R-wave progression; left atrial enlargement, right atrial enlargement  Old EKG Reviewed: unchanged- 01/02/13  1:52 PM Reevaluation with update and discussion. After initial assessment and  treatment, an updated evaluation reveals  patient feels better. She is hungry and thirsty. CBG has improved. IV insulin has been ordered, she would like something for her sciatica, Toradol, ordered . Rubin Dais L    4:55 PM Reevaluation with update and discussion. After initial assessment and treatment, an updated evaluation reveals She feels well, no CP or sciatica pain. She is eating without problem. Repeat CBG  . Kamalani Mastro L    Labs Reviewed  GLUCOSE, CAPILLARY - Abnormal; Notable for the following:    Glucose-Capillary 567 (*)    All other components within normal limits  GLUCOSE, CAPILLARY - Abnormal; Notable for the following:    Glucose-Capillary 465 (*)    All other components within normal limits  URINALYSIS, ROUTINE W REFLEX MICROSCOPIC - Abnormal; Notable for the following:    Glucose, UA >1000 (*)    Ketones, ur 15 (*)    All other components within normal limits  CBC WITH DIFFERENTIAL - Abnormal; Notable for the following:    WBC 25.9 (*)  RBC 3.86 (*)    Hemoglobin 11.7 (*)    HCT 34.9 (*)    All other components within normal limits  COMPREHENSIVE METABOLIC PANEL - Abnormal; Notable for the following:    Sodium 130 (*)    Potassium 5.2 (*)    Chloride 93 (*)    Glucose, Bld 624 (*)    BUN 28 (*)    GFR calc non Af Amer 53 (*)    GFR calc Af Amer 61 (*)    All other components within normal limits  GLUCOSE, CAPILLARY - Abnormal; Notable for the following:    Glucose-Capillary 341 (*)    All other components within normal limits  URINE MICROSCOPIC-ADD ON - Abnormal; Notable for the following:    Squamous Epithelial / LPF FEW (*)    Casts HYALINE CASTS (*)    All other components within normal limits  GLUCOSE, CAPILLARY - Abnormal; Notable for the following:    Glucose-Capillary 267 (*)    All other components within normal limits  GLUCOSE, CAPILLARY - Abnormal; Notable for the following:    Glucose-Capillary 151 (*)    All other components within  normal limits  D-DIMER, QUANTITATIVE  URINALYSIS, ROUTINE W REFLEX MICROSCOPIC  CG4 I-STAT (LACTIC ACID)  POCT I-STAT TROPONIN I   Dg Chest Portable 1 View  04/08/2013  *RADIOLOGY REPORT*  Clinical Data: Chest pain  PORTABLE CHEST - 1 VIEW  Comparison: 01/02/2013  Findings: Cardiomediastinal silhouette is stable.  Thoracic dextroscoliosis again noted.  Right shoulder prosthesis.  No acute infiltrate or pulmonary edema.  IMPRESSION: No active disease.  No significant change.   Original Report Authenticated By: Natasha Mead, M.D.      1. Nonspecific chest pain   2. Sciatica, left   3. Medication intolerance   4. Hyperglycemia without ketosis       MDM  Nonspecific chest pain with reassuring, EKG, unchanged from prior. Doubt ACS, PE, or pneumonia. Chest pain, improved with treatment of GI cocktail. Marked hyperglycemia with mild hyponatremia and hyperkalemia. Marked white blood cell count elevation. The syndrome seems most associated with use of prednisone. She is not septic appearing, toxic, and has improved with treatment. In emergency department. Doubt metabolic instability, serious bacterial infection or impending vascular collapse; the patient is stable for discharge.   Nursing Notes Reviewed/ Care Coordinated, and agree without changes. Applicable Imaging Reviewed.  Interpretation of Laboratory Data incorporated into ED treatment   Plan: Home Medications- stopped prednisone, use ketorolac when necessary, no more than 5 days; Home Treatments- rest; Recommended follow up- PCP, one week for followup, orthopedic followup for consideration of a therapeutic injection        Flint Melter, MD 04/08/13 1703  Flint Melter, MD 04/08/13 [phone]

## 2013-08-05 ENCOUNTER — Encounter: Payer: Self-pay | Admitting: Diagnostic Neuroimaging

## 2013-08-05 ENCOUNTER — Ambulatory Visit (INDEPENDENT_AMBULATORY_CARE_PROVIDER_SITE_OTHER): Payer: Medicare Other | Admitting: Diagnostic Neuroimaging

## 2013-08-05 VITALS — BP 154/54 | HR 79 | Temp 98.5°F | Ht 63.0 in | Wt 126.0 lb

## 2013-08-05 DIAGNOSIS — R55 Syncope and collapse: Secondary | ICD-10-CM

## 2013-08-05 NOTE — Patient Instructions (Signed)
Follow up with PCP (Dr. Clarene Duke).
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
## 2013-10-15 ENCOUNTER — Other Ambulatory Visit (HOSPITAL_COMMUNITY): Payer: Self-pay | Admitting: General Surgery

## 2013-10-15 ENCOUNTER — Ambulatory Visit (HOSPITAL_COMMUNITY)
Admission: RE | Admit: 2013-10-15 | Discharge: 2013-10-15 | Disposition: A | Discharge status: Home / Self Care | Payer: Medicare Other | Source: Ambulatory Visit | Attending: General Surgery | Admitting: General Surgery

## 2013-10-15 ENCOUNTER — Encounter (HOSPITAL_BASED_OUTPATIENT_CLINIC_OR_DEPARTMENT_OTHER): Payer: Medicare Other | Attending: General Surgery

## 2013-10-15 DIAGNOSIS — L02619 Cutaneous abscess of unspecified foot: Secondary | ICD-10-CM | POA: Insufficient documentation

## 2013-10-15 DIAGNOSIS — X58XXXA Exposure to other specified factors, initial encounter: Secondary | ICD-10-CM | POA: Insufficient documentation

## 2013-10-15 DIAGNOSIS — E1169 Type 2 diabetes mellitus with other specified complication: Secondary | ICD-10-CM | POA: Insufficient documentation

## 2013-10-15 DIAGNOSIS — M2060 Acquired deformities of toe(s), unspecified, unspecified foot: Secondary | ICD-10-CM | POA: Insufficient documentation

## 2013-10-15 DIAGNOSIS — S99929A Unspecified injury of unspecified foot, initial encounter: Secondary | ICD-10-CM | POA: Insufficient documentation

## 2013-10-15 DIAGNOSIS — M79675 Pain in left toe(s): Secondary | ICD-10-CM

## 2013-10-15 DIAGNOSIS — M259 Joint disorder, unspecified: Secondary | ICD-10-CM | POA: Insufficient documentation

## 2013-10-15 DIAGNOSIS — L97509 Non-pressure chronic ulcer of other part of unspecified foot with unspecified severity: Secondary | ICD-10-CM | POA: Insufficient documentation

## 2013-10-15 DIAGNOSIS — E119 Type 2 diabetes mellitus without complications: Secondary | ICD-10-CM | POA: Insufficient documentation

## 2013-10-15 DIAGNOSIS — L03039 Cellulitis of unspecified toe: Secondary | ICD-10-CM | POA: Insufficient documentation

## 2013-10-15 DIAGNOSIS — S8990XA Unspecified injury of unspecified lower leg, initial encounter: Secondary | ICD-10-CM | POA: Insufficient documentation

## 2013-10-22 LAB — GLUCOSE, CAPILLARY: Glucose-Capillary: 224 mg/dL — ABNORMAL HIGH (ref 70–99)

## 2013-11-12 ENCOUNTER — Other Ambulatory Visit (HOSPITAL_COMMUNITY): Payer: Self-pay | Admitting: General Surgery

## 2013-11-12 ENCOUNTER — Ambulatory Visit (HOSPITAL_COMMUNITY)
Admission: RE | Admit: 2013-11-12 | Discharge: 2013-11-12 | Disposition: A | Discharge status: Home / Self Care | Payer: Medicare Other | Source: Ambulatory Visit | Attending: General Surgery | Admitting: General Surgery

## 2013-11-12 ENCOUNTER — Encounter (HOSPITAL_BASED_OUTPATIENT_CLINIC_OR_DEPARTMENT_OTHER): Payer: Medicare Other | Attending: General Surgery

## 2013-11-12 DIAGNOSIS — E1169 Type 2 diabetes mellitus with other specified complication: Secondary | ICD-10-CM | POA: Insufficient documentation

## 2013-11-12 DIAGNOSIS — M259 Joint disorder, unspecified: Secondary | ICD-10-CM | POA: Insufficient documentation

## 2013-11-12 DIAGNOSIS — M869 Osteomyelitis, unspecified: Secondary | ICD-10-CM

## 2013-11-12 DIAGNOSIS — L97509 Non-pressure chronic ulcer of other part of unspecified foot with unspecified severity: Secondary | ICD-10-CM | POA: Insufficient documentation

## 2013-11-19 ENCOUNTER — Ambulatory Visit (HOSPITAL_COMMUNITY)
Admission: RE | Admit: 2013-11-19 | Discharge: 2013-11-19 | Disposition: A | Discharge status: Home / Self Care | Payer: Medicare Other | Source: Ambulatory Visit | Attending: General Surgery | Admitting: General Surgery

## 2013-11-19 ENCOUNTER — Other Ambulatory Visit (HOSPITAL_COMMUNITY): Payer: Self-pay | Admitting: General Surgery

## 2013-11-19 DIAGNOSIS — Z01818 Encounter for other preprocedural examination: Secondary | ICD-10-CM | POA: Insufficient documentation

## 2013-11-19 DIAGNOSIS — L02619 Cutaneous abscess of unspecified foot: Secondary | ICD-10-CM | POA: Insufficient documentation

## 2013-11-19 DIAGNOSIS — E1169 Type 2 diabetes mellitus with other specified complication: Secondary | ICD-10-CM | POA: Insufficient documentation

## 2013-11-19 DIAGNOSIS — L97509 Non-pressure chronic ulcer of other part of unspecified foot with unspecified severity: Secondary | ICD-10-CM | POA: Insufficient documentation

## 2013-11-19 DIAGNOSIS — Z96619 Presence of unspecified artificial shoulder joint: Secondary | ICD-10-CM | POA: Insufficient documentation

## 2013-11-19 DIAGNOSIS — L03039 Cellulitis of unspecified toe: Secondary | ICD-10-CM | POA: Insufficient documentation

## 2013-11-19 DIAGNOSIS — T148XXA Other injury of unspecified body region, initial encounter: Secondary | ICD-10-CM

## 2013-11-19 DIAGNOSIS — A4901 Methicillin susceptible Staphylococcus aureus infection, unspecified site: Secondary | ICD-10-CM | POA: Insufficient documentation

## 2013-11-19 DIAGNOSIS — I10 Essential (primary) hypertension: Secondary | ICD-10-CM | POA: Insufficient documentation

## 2013-11-19 NOTE — Progress Notes (Signed)
[facility]  NAME:  Danielle Clarke, Danielle Clarke NO.:  [id number]  MEDICAL RECORD NO.:  [id number]      DATE OF BIRTH:  05/13/1941  PHYSICIAN:  Ardath Sax, M.D.           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  I have been following this for a couple of months now and has not improved with ordinary care.  She initially had an abscess which I did an I and D, and put an Iodoform and drained it.  It cultured out Staph aureus, and I put her on Bactrim DS.  This has helped somewhat but she still has cellulitis around the foot.  I am going to continue the antibiotics.  I did get an x-ray and it showed that there was not evidence of osteomyelitis, but when I examine the wound it goes right down to the periosteum of the middle phalanx of the left 2nd toe. Today, she is afebrile.  Her blood pressure is 154/60, her pulse is 80. She weighs 124 pounds.  This lady is 72 years of age and has been taking good care of her diabetes.  Her blood sugar today was 100.  I am going to apply to [facility] for permission to involve her in hyperbaric oxygen treatment because of the diabetic ulcer, which has become infected and I feel that the hyperbaric oxygen along with the antibiotics and dressing changes.  We will heal up this diabetic ulcer.     Ardath Sax, M.D.     PP/MEDQ  D:  11/19/2013  T:  11/19/2013  Job:  [id number]

## 2013-12-03 ENCOUNTER — Encounter (HOSPITAL_BASED_OUTPATIENT_CLINIC_OR_DEPARTMENT_OTHER): Payer: Medicare Other | Attending: General Surgery

## 2013-12-03 DIAGNOSIS — E1169 Type 2 diabetes mellitus with other specified complication: Secondary | ICD-10-CM | POA: Insufficient documentation

## 2013-12-03 DIAGNOSIS — L97509 Non-pressure chronic ulcer of other part of unspecified foot with unspecified severity: Secondary | ICD-10-CM | POA: Insufficient documentation

## 2013-12-08 LAB — GLUCOSE, CAPILLARY
Glucose-Capillary: 204 mg/dL — ABNORMAL HIGH (ref 70–99)
Glucose-Capillary: 89 mg/dL (ref 70–99)

## 2013-12-09 LAB — GLUCOSE, CAPILLARY: Glucose-Capillary: 329 mg/dL — ABNORMAL HIGH (ref 70–99)

## 2013-12-10 LAB — GLUCOSE, CAPILLARY
Glucose-Capillary: 201 mg/dL — ABNORMAL HIGH (ref 70–99)
Glucose-Capillary: 205 mg/dL — ABNORMAL HIGH (ref 70–99)

## 2013-12-11 LAB — GLUCOSE, CAPILLARY
Glucose-Capillary: 229 mg/dL — ABNORMAL HIGH (ref 70–99)
Glucose-Capillary: 70 mg/dL (ref 70–99)

## 2013-12-12 LAB — GLUCOSE, CAPILLARY
Glucose-Capillary: 176 mg/dL — ABNORMAL HIGH (ref 70–99)
Glucose-Capillary: 360 mg/dL — ABNORMAL HIGH (ref 70–99)

## 2013-12-15 LAB — GLUCOSE, CAPILLARY
Glucose-Capillary: 165 mg/dL — ABNORMAL HIGH (ref 70–99)
Glucose-Capillary: 31 mg/dL — CL (ref 70–99)
Glucose-Capillary: 44 mg/dL — CL (ref 70–99)
Glucose-Capillary: 67 mg/dL — ABNORMAL LOW (ref 70–99)
Glucose-Capillary: 98 mg/dL (ref 70–99)

## 2013-12-16 LAB — GLUCOSE, CAPILLARY
Glucose-Capillary: 332 mg/dL — ABNORMAL HIGH (ref 70–99)
Glucose-Capillary: 396 mg/dL — ABNORMAL HIGH (ref 70–99)

## 2013-12-17 LAB — GLUCOSE, CAPILLARY
Glucose-Capillary: 523 mg/dL — ABNORMAL HIGH (ref 70–99)
Glucose-Capillary: 528 mg/dL — ABNORMAL HIGH (ref 70–99)

## 2013-12-18 LAB — GLUCOSE, CAPILLARY
Glucose-Capillary: 215 mg/dL — ABNORMAL HIGH (ref 70–99)
Glucose-Capillary: 266 mg/dL — ABNORMAL HIGH (ref 70–99)

## 2013-12-19 LAB — GLUCOSE, CAPILLARY
Glucose-Capillary: 136 mg/dL — ABNORMAL HIGH (ref 70–99)
Glucose-Capillary: 244 mg/dL — ABNORMAL HIGH (ref 70–99)

## 2013-12-22 LAB — GLUCOSE, CAPILLARY
Glucose-Capillary: 412 mg/dL — ABNORMAL HIGH (ref 70–99)
Glucose-Capillary: 371 mg/dL — ABNORMAL HIGH (ref 70–99)

## 2013-12-24 LAB — GLUCOSE, CAPILLARY
Glucose-Capillary: 196 mg/dL — ABNORMAL HIGH (ref 70–99)
Glucose-Capillary: 286 mg/dL — ABNORMAL HIGH (ref 70–99)

## 2013-12-25 LAB — GLUCOSE, CAPILLARY
Glucose-Capillary: 105 mg/dL — ABNORMAL HIGH (ref 70–99)
Glucose-Capillary: 224 mg/dL — ABNORMAL HIGH (ref 70–99)
Glucose-Capillary: 89 mg/dL (ref 70–99)
Glucose-Capillary: 94 mg/dL (ref 70–99)
Glucose-Capillary: 300 mg/dL — ABNORMAL HIGH (ref 70–99)

## 2013-12-29 ENCOUNTER — Encounter (HOSPITAL_COMMUNITY): Payer: Self-pay | Admitting: Emergency Medicine

## 2013-12-29 ENCOUNTER — Encounter (HOSPITAL_BASED_OUTPATIENT_CLINIC_OR_DEPARTMENT_OTHER): Payer: Medicare Other | Attending: General Surgery

## 2013-12-29 ENCOUNTER — Observation Stay (HOSPITAL_COMMUNITY)
Admission: EM | Admit: 2013-12-29 | Discharge: 2014-01-01 | Disposition: A | Discharge status: Home / Self Care | Payer: Medicare Other | Attending: Family Medicine | Admitting: Family Medicine

## 2013-12-29 DIAGNOSIS — I129 Hypertensive chronic kidney disease with stage 1 through stage 4 chronic kidney disease, or unspecified chronic kidney disease: Secondary | ICD-10-CM | POA: Insufficient documentation

## 2013-12-29 DIAGNOSIS — N179 Acute kidney failure, unspecified: Principal | ICD-10-CM | POA: Diagnosis present

## 2013-12-29 DIAGNOSIS — D649 Anemia, unspecified: Secondary | ICD-10-CM | POA: Diagnosis present

## 2013-12-29 DIAGNOSIS — I6529 Occlusion and stenosis of unspecified carotid artery: Secondary | ICD-10-CM | POA: Insufficient documentation

## 2013-12-29 DIAGNOSIS — I10 Essential (primary) hypertension: Secondary | ICD-10-CM | POA: Diagnosis present

## 2013-12-29 DIAGNOSIS — E1169 Type 2 diabetes mellitus with other specified complication: Secondary | ICD-10-CM | POA: Insufficient documentation

## 2013-12-29 DIAGNOSIS — Z79899 Other long term (current) drug therapy: Secondary | ICD-10-CM | POA: Insufficient documentation

## 2013-12-29 DIAGNOSIS — E785 Hyperlipidemia, unspecified: Secondary | ICD-10-CM | POA: Diagnosis present

## 2013-12-29 DIAGNOSIS — R55 Syncope and collapse: Secondary | ICD-10-CM | POA: Insufficient documentation

## 2013-12-29 DIAGNOSIS — Z881 Allergy status to other antibiotic agents status: Secondary | ICD-10-CM | POA: Insufficient documentation

## 2013-12-29 DIAGNOSIS — L03039 Cellulitis of unspecified toe: Secondary | ICD-10-CM | POA: Insufficient documentation

## 2013-12-29 DIAGNOSIS — L02619 Cutaneous abscess of unspecified foot: Secondary | ICD-10-CM | POA: Insufficient documentation

## 2013-12-29 DIAGNOSIS — Z883 Allergy status to other anti-infective agents status: Secondary | ICD-10-CM | POA: Insufficient documentation

## 2013-12-29 DIAGNOSIS — E109 Type 1 diabetes mellitus without complications: Secondary | ICD-10-CM | POA: Diagnosis present

## 2013-12-29 DIAGNOSIS — L089 Local infection of the skin and subcutaneous tissue, unspecified: Secondary | ICD-10-CM | POA: Diagnosis present

## 2013-12-29 DIAGNOSIS — E162 Hypoglycemia, unspecified: Secondary | ICD-10-CM

## 2013-12-29 DIAGNOSIS — Z794 Long term (current) use of insulin: Secondary | ICD-10-CM | POA: Insufficient documentation

## 2013-12-29 DIAGNOSIS — L97509 Non-pressure chronic ulcer of other part of unspecified foot with unspecified severity: Secondary | ICD-10-CM | POA: Insufficient documentation

## 2013-12-29 DIAGNOSIS — Z7982 Long term (current) use of aspirin: Secondary | ICD-10-CM | POA: Insufficient documentation

## 2013-12-29 DIAGNOSIS — E875 Hyperkalemia: Secondary | ICD-10-CM

## 2013-12-29 DIAGNOSIS — N182 Chronic kidney disease, stage 2 (mild): Secondary | ICD-10-CM | POA: Diagnosis present

## 2013-12-29 LAB — CBC WITH DIFFERENTIAL/PLATELET
Basophils Absolute: 0.1 10*3/uL (ref 0.0–0.1)
Basophils Relative: 1 % (ref 0–1)
Eosinophils Absolute: 0.2 10*3/uL (ref 0.0–0.7)
Eosinophils Relative: 2 % (ref 0–5)
HCT: 27.8 % — ABNORMAL LOW (ref 36.0–46.0)
Hemoglobin: 9.4 g/dL — ABNORMAL LOW (ref 12.0–15.0)
Lymphocytes Relative: 27 % (ref 12–46)
Lymphs Abs: 2 10*3/uL (ref 0.7–4.0)
MCH: 31 pg (ref 26.0–34.0)
MCHC: 33.8 g/dL (ref 30.0–36.0)
MCV: 91.7 fL (ref 78.0–100.0)
Monocytes Absolute: 0.7 10*3/uL (ref 0.1–1.0)
Monocytes Relative: 9 % (ref 3–12)
Neutro Abs: 4.4 10*3/uL (ref 1.7–7.7)
Neutrophils Relative %: 61 % (ref 43–77)
Platelets: 378 10*3/uL (ref 150–400)
RBC: 3.03 MIL/uL — ABNORMAL LOW (ref 3.87–5.11)
RDW: 13.3 % (ref 11.5–15.5)
WBC: 7.3 10*3/uL (ref 4.0–10.5)

## 2013-12-29 LAB — BASIC METABOLIC PANEL
BUN: 28 mg/dL — ABNORMAL HIGH (ref 6–23)
CO2: 26 mEq/L (ref 19–32)
Calcium: 9.4 mg/dL (ref 8.4–10.5)
Chloride: 96 mEq/L (ref 96–112)
Creatinine, Ser: 2.17 mg/dL — ABNORMAL HIGH (ref 0.50–1.10)
GFR calc Af Amer: 25 mL/min — ABNORMAL LOW (ref >90)
GFR calc non Af Amer: 22 mL/min — ABNORMAL LOW (ref >90)
Glucose, Bld: 65 mg/dL — ABNORMAL LOW (ref 70–99)
Potassium: 6.3 mEq/L — ABNORMAL HIGH (ref 3.7–5.3)
Sodium: 132 mEq/L — ABNORMAL LOW (ref 137–147)

## 2013-12-29 LAB — GLUCOSE, CAPILLARY
Glucose-Capillary: 44 mg/dL — CL (ref 70–99)
Glucose-Capillary: 62 mg/dL — ABNORMAL LOW (ref 70–99)

## 2013-12-29 MED ORDER — SODIUM POLYSTYRENE SULFONATE 15 GM/60ML PO SUSP
30.0000 g | Freq: Once | ORAL | Status: AC
  Administered 2013-12-29: 30 g via ORAL
  Filled 2013-12-29: qty 120

## 2013-12-29 MED ORDER — HEPARIN SODIUM (PORCINE) 5000 UNIT/ML IJ SOLN
5000.0000 [IU] | Freq: Three times a day (TID) | INTRAMUSCULAR | Status: DC
  Administered 2013-12-30 – 2014-01-01 (×7): 5000 [IU] via SUBCUTANEOUS
  Filled 2013-12-29 (×10): qty 1

## 2013-12-29 MED ORDER — SODIUM CHLORIDE 0.9 % IV SOLN
1000.0000 mL | Freq: Once | INTRAVENOUS | Status: AC
  Administered 2013-12-29: 1000 mL via INTRAVENOUS

## 2013-12-29 MED ORDER — SODIUM CHLORIDE 0.9 % IJ SOLN
3.0000 mL | Freq: Two times a day (BID) | INTRAMUSCULAR | Status: DC
  Administered 2013-12-29 – 2013-12-31 (×2): 3 mL via INTRAVENOUS

## 2013-12-29 MED ORDER — CYCLOBENZAPRINE HCL 5 MG PO TABS
5.0000 mg | ORAL_TABLET | Freq: Every day | ORAL | Status: DC | PRN
  Filled 2013-12-29: qty 1

## 2013-12-29 MED ORDER — SODIUM CHLORIDE 0.9 % IV SOLN: 1000.0000 mL | INTRAVENOUS | Status: DC

## 2013-12-29 MED ORDER — TRAMADOL HCL 50 MG PO TABS
100.0000 mg | ORAL_TABLET | Freq: Every day | ORAL | Status: DC
  Administered 2013-12-29 – 2013-12-31 (×3): 100 mg via ORAL
  Filled 2013-12-29 (×3): qty 2

## 2013-12-29 MED ORDER — INSULIN GLARGINE 100 UNIT/ML ~~LOC~~ SOLN
10.0000 [IU] | Freq: Every morning | SUBCUTANEOUS | Status: DC
  Administered 2013-12-30: 10 [IU] via SUBCUTANEOUS
  Filled 2013-12-29 (×2): qty 0.1

## 2013-12-29 MED ORDER — SODIUM CHLORIDE 0.9 % IV SOLN
INTRAVENOUS | Status: DC
  Administered 2013-12-30 – 2013-12-31 (×3): via INTRAVENOUS

## 2013-12-29 MED ORDER — SODIUM CHLORIDE 0.9 % IV SOLN
INTRAVENOUS | Status: AC
  Administered 2013-12-29: via INTRAVENOUS

## 2013-12-29 MED ORDER — INSULIN ASPART 100 UNIT/ML ~~LOC~~ SOLN: 2.0000 [IU] | SUBCUTANEOUS | Status: DC

## 2013-12-29 MED ORDER — SIMVASTATIN 40 MG PO TABS
40.0000 mg | ORAL_TABLET | Freq: Every evening | ORAL | Status: DC
  Administered 2013-12-29 – 2013-12-31 (×3): 40 mg via ORAL
  Filled 2013-12-29 (×4): qty 1

## 2013-12-29 MED ORDER — SODIUM CHLORIDE 0.9 % IV BOLUS (SEPSIS): 1000.0000 mL | Freq: Once | INTRAVENOUS | Status: DC

## 2013-12-29 MED ORDER — ASPIRIN EC 81 MG PO TBEC
81.0000 mg | DELAYED_RELEASE_TABLET | Freq: Every day | ORAL | Status: DC
  Administered 2013-12-30 – 2014-01-01 (×3): 81 mg via ORAL
  Filled 2013-12-29 (×3): qty 1

## 2013-12-29 NOTE — ED Provider Notes (Signed)
CSN: [id number]     Arrival date & time 12/29/13  1821 History   First MD Initiated Contact with Patient 12/29/13 1849     Chief Complaint  Patient presents with  . abnormal labs     HPI Pt was at her doctors office on Friday and had blood testing.  Pt was following up on a chronic wound issue with a toe on her left foot.  Pt was taking bactrim and has been doing hyperbaric treatments.  She has been keeping her blood sugars higher because of concerns of hypoglycemia while under pressure.  Pt has had some nausea and vomiting with her blood sugars running a bit higher.  No blood in stool.  No dark stools.No pain, no fever.  No diarrhea.  She was told that the lab tests from Friday were abnormal so she went back to the office.  Pt had repeat testing today and was told that she has abnormal kidney function and elevated potassium levels and should go to the ED.  Past Medical History  Diagnosis Date  . Diabetes mellitus 1957  . Arthritis   . Neck fracture     july 2013   Past Surgical History  Procedure Laterality Date  . Left ankle surgery  2001    steel plate and 3 screws   . 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 Eye surgery      cataract surgery  . Tonsillectomy and adenoidectomy  1949  . Breast surgery  1988   Family History  Problem Relation Age of Onset  . Cancer Mother   . Heart Problems Father    History  Substance Use Topics  . Smoking status: Never Smoker   . Smokeless tobacco: Never Used  . Alcohol Use: 0.6 oz/week    1 Glasses of wine per week     Comment: occasionally   OB History   Grav Para Term Preterm Abortions TAB SAB Ect Mult Living                 Review of Systems  All other systems reviewed and are negative.    Allergies  Cefaclor; Tetracycline; Vancomycin; and Prednisone  Home Medications   Current Outpatient Rx  Name  Route  Sig  Dispense  Refill  . alendronate (FOSAMAX) 40 MG tablet   Oral   Take 40 mg by mouth every 7  (seven) days. Take with a full glass of water on an empty stomach every Wednesday morning. If pt's blood sugar is in good condition she takes.         . Ascorbic Acid (VITAMIN C PO)   Oral   Take 1 tablet by mouth daily at 12 noon.         Marland Kitchen aspirin EC 81 MG tablet   Oral   Take 81 mg by mouth daily.         . cholecalciferol (VITAMIN D) 1000 UNITS tablet   Oral   Take 2,000 Units by mouth at bedtime.         . Chromium 1000 MCG TABS   Oral   Take 1,000 mcg by mouth every morning.         Marland Kitchen CINNAMON PO   Oral   Take 1,000 mg by mouth 4 (four) times daily.         . cyclobenzaprine (FLEXERIL) 5 MG tablet   Oral   Take 5 mg by mouth daily as needed (muscle  spasm).          Marland Kitchen diclofenac (VOLTAREN) 75 MG EC tablet   Oral   Take 75 mg by mouth every morning.         . Flaxseed, Linseed, 1000 MG CAPS   Oral   Take 1,000 mg by mouth 2 (two) times daily.          . insulin aspart (NOVOLOG) 100 UNIT/ML injection   Subcutaneous   Inject 2-8 Units into the skin See admin instructions. Sliding scale. Base units = Reading below 80 subtract 1; 80-150 take 2 units (base dose); 151-200 add 1 unit; 201-250 add 2 units; 251-300 add 3 units; 301-350 add 4 units; 351-400 add 5 units; 401-450 add 6 units         . insulin glargine (LANTUS) 100 UNIT/ML injection   Subcutaneous   Inject 10 Units into the skin every morning.          . Multiple Vitamin (MULTIVITAMIN WITH MINERALS) TABS   Oral   Take 1 tablet by mouth every morning.         Marland Kitchen omega-3 acid ethyl esters (LOVAZA) 1 G capsule   Oral   Take 2 g by mouth 3 (three) times daily.         . ONE TOUCH ULTRA TEST test strip               . quinapril (ACCUPRIL) 40 MG tablet   Oral   Take 40 mg by mouth every morning.         . simvastatin (ZOCOR) 40 MG tablet   Oral   Take 40 mg by mouth every evening.         . sulfamethoxazole-trimethoprim (BACTRIM,SEPTRA) 400-80 MG per tablet   Oral   Take  1 tablet by mouth daily.         . traMADol (ULTRAM) 50 MG tablet   Oral   Take 100 mg by mouth at bedtime.          . TURMERIC PO   Oral   Take 1 capsule by mouth daily at 12 noon.         . VENTOLIN HFA 108 (90 BASE) MCG/ACT inhaler               . Glucosamine HCl-MSM (GLUCOSAMINE-MSM PO)   Oral   Take 1 tablet by mouth 2 (two) times daily. 1500mg           BP 98/41  Pulse 88  Temp(Src) 98.6 F (37 C) (Oral)  Resp 16  SpO2 98% Physical Exam  Nursing note and vitals reviewed. Constitutional: She appears well-developed and well-nourished. No distress.  HENT:  Head: Normocephalic and atraumatic.  Right Ear: External ear normal.  Left Ear: External ear normal.  Eyes: Conjunctivae are normal. Right eye exhibits no discharge. Left eye exhibits no discharge. No scleral icterus.  Neck: Neck supple. No tracheal deviation present.  Cardiovascular: Normal rate, regular rhythm and intact distal pulses.   Pulmonary/Chest: Effort normal and breath sounds normal. No stridor. No respiratory distress. She has no wheezes. She has no rales.  Abdominal: Soft. Bowel sounds are normal. She exhibits no distension. There is no tenderness. There is no rebound and no guarding.  Musculoskeletal: She exhibits no edema and no tenderness.  Neurological: She is alert. She has normal strength. No cranial nerve deficit (no facial droop, extraocular movements intact, no slurred speech) or sensory deficit. She exhibits normal muscle tone. She displays no  seizure activity. Coordination normal.  Skin: Skin is warm and dry. No rash noted.  Psychiatric: She has a normal mood and affect.    ED Course  Procedures (including critical care time) Labs Review Labs Reviewed  CBC WITH DIFFERENTIAL - Abnormal; Notable for the following:    RBC 3.03 (*)    Hemoglobin 9.4 (*)    HCT 27.8 (*)    All other components within normal limits  BASIC METABOLIC PANEL - Abnormal; Notable for the following:     Sodium 132 (*)    Potassium 6.3 (*)    Glucose, Bld 65 (*)    BUN 28 (*)    Creatinine, Ser 2.17 (*)    GFR calc non Af Amer 22 (*)    GFR calc Af Amer 25 (*)    All other components within normal limits   Imaging Review No results found.  EKG Interpretation    Date/Time:  Monday December 29 2013 19:30:54 EST Ventricular Rate:  75 PR Interval:  149 QRS Duration: 101 QT Interval:  397 QTC Calculation: 443 R Axis:   -20 Text Interpretation:  Sinus rhythm Poor R wave progression No significant change since last tracing Confirmed by Salome Cozby  MD-J, Theotis Gerdeman ([id number]) on 12/29/2013 7:34:44 PM           Medications  0.9 %  sodium chloride infusion (1,000 mLs Intravenous New Bag/Given 12/29/13 1952)    Followed by  0.9 %  sodium chloride infusion (not administered)  sodium chloride 0.9 % bolus 1,000 mL (not administered)    Food given and juice given for her low blood sugar.  MDM   1. Acute renal failure   2. Hyperkalemia    Pt with acute renal failure.  Creatinine has approximately doubled since last year.  Potassium is elevated at 6.3 without EKG changes.    Could be related to her episodes of vomiting.  May also be related to her ace inhibitor.  Pt denies taking voltaren any longer.  According to the patient her lab tests have increased since Friday.  Will consult with medical service regarding admission for IV fluids , monitoring.    Celene KrasJon R Faatima Tench, MD 12/29/13 2105

## 2013-12-29 NOTE — H&P (Signed)
[facility] History and Physical  Danielle Clarke KGM:[id number] DOB: [birth year] DOA: 12/29/2013  Referring physician: EDP PCP: Mickie HillierLITTLE,KEVIN LORNE, MD   Chief Complaint: Abnormal labs   HPI: Danielle FordyceJacqueline J Clarke is a 73 y.o. female who presents to the ED after routine blood work done at her PCPs office this weekend demonstrated elevated creatinine and hyperkalemia.  Patient has been having no symptoms.  Recently she has been being treated for a toe infection with hyperbaric oxygen at a wound clinic.  She has been taking bactrim since November for her toe.  She reports no other medication changes other than decreased insulin before treatments to prevent hypoglycemia.  Review of Systems: Systems reviewed.  As above, otherwise negative  Past Medical History  Diagnosis Date  . Diabetes mellitus 1957  . Arthritis   . Neck fracture     july 2013   Past Surgical History  Procedure Laterality Date  . Left ankle surgery  2001    steel plate and 3 screws   . 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 Eye surgery      cataract surgery  . Tonsillectomy and adenoidectomy  1949  . Breast surgery  1988   Social History:  reports that she has never smoked. She has never used smokeless tobacco. She reports that she drinks about 0.6 ounces of alcohol per week. She reports that she does not use illicit drugs.  Allergies  Allergen Reactions  . Cefaclor Anaphylaxis  . Tetracycline Anaphylaxis  . Vancomycin Anaphylaxis  . Prednisone     Family History  Problem Relation Age of Onset  . Cancer Mother   . Heart Problems Father      Prior to Admission medications   Medication Sig Start Date End Date Taking? Authorizing Provider  alendronate (FOSAMAX) 40 MG tablet Take 40 mg by mouth every 7 (seven) days. Take with a full glass of water on an empty stomach every Wednesday morning. If pt's blood sugar is in good condition she takes.   Yes Historical Provider, MD  Ascorbic  Acid (VITAMIN C PO) Take 1 tablet by mouth daily at 12 noon.   Yes Historical Provider, MD  aspirin EC 81 MG tablet Take 81 mg by mouth daily.   Yes Historical Provider, MD  cholecalciferol (VITAMIN D) 1000 UNITS tablet Take 2,000 Units by mouth at bedtime.   Yes Historical Provider, MD  Chromium 1000 MCG TABS Take 1,000 mcg by mouth every morning.   Yes Historical Provider, MD  CINNAMON PO Take 1,000 mg by mouth 4 (four) times daily.   Yes Historical Provider, MD  cyclobenzaprine (FLEXERIL) 5 MG tablet Take 5 mg by mouth daily as needed (muscle spasm).  07/08/13  Yes Historical Provider, MD  diclofenac (VOLTAREN) 75 MG EC tablet Take 75 mg by mouth every morning.   Yes Historical Provider, MD  Flaxseed, Linseed, 1000 MG CAPS Take 1,000 mg by mouth 2 (two) times daily.    Yes Historical Provider, MD  insulin aspart (NOVOLOG) 100 UNIT/ML injection Inject 2-8 Units into the skin See admin instructions. Sliding scale. Base units = Reading below 80 subtract 1; 80-150 take 2 units (base dose); 151-200 add 1 unit; 201-250 add 2 units; 251-300 add 3 units; 301-350 add 4 units; 351-400 add 5 units; 401-450 add 6 units   Yes Historical Provider, MD  insulin glargine (LANTUS) 100 UNIT/ML injection Inject 10 Units into the skin every morning.    Yes Historical Provider,  MD  Multiple Vitamin (MULTIVITAMIN WITH MINERALS) TABS Take 1 tablet by mouth every morning.   Yes Historical Provider, MD  omega-3 acid ethyl esters (LOVAZA) 1 G capsule Take 2 g by mouth 3 (three) times daily.   Yes Historical Provider, MD  ONE TOUCH ULTRA TEST test strip  07/27/13  Yes Historical Provider, MD  quinapril (ACCUPRIL) 40 MG tablet Take 40 mg by mouth every morning.   Yes Historical Provider, MD  simvastatin (ZOCOR) 40 MG tablet Take 40 mg by mouth every evening.   Yes Historical Provider, MD  sulfamethoxazole-trimethoprim (BACTRIM,SEPTRA) 400-80 MG per tablet Take 1 tablet by mouth daily.   Yes Historical Provider, MD  traMADol  (ULTRAM) 50 MG tablet Take 100 mg by mouth at bedtime.    Yes Historical Provider, MD  TURMERIC PO Take 1 capsule by mouth daily at 12 noon.   Yes Historical Provider, MD  VENTOLIN HFA 108 (90 BASE) MCG/ACT inhaler  05/13/13  Yes Historical Provider, MD  Glucosamine HCl-MSM (GLUCOSAMINE-MSM PO) Take 1 tablet by mouth 2 (two) times daily. 1500mg     Historical Provider, MD   Physical Exam: Filed Vitals:   12/29/13 1838  BP: 98/41  Pulse: 88  Temp: 98.6 F (37 C)  Resp: 16    BP 98/41  Pulse 88  Temp(Src) 98.6 F (37 C) (Oral)  Resp 16  SpO2 98%  General Appearance:    Alert, oriented, no distress, appears stated age  Head:    Normocephalic, atraumatic  Eyes:    PERRL, EOMI, sclera non-icteric        Nose:   Nares without drainage or epistaxis. Mucosa, turbinates normal  Throat:   Moist mucous membranes. Oropharynx without erythema or exudate.  Neck:   Supple. No carotid bruits.  No thyromegaly.  No lymphadenopathy.   Back:     No CVA tenderness, no spinal tenderness  Lungs:     Clear to auscultation bilaterally, without wheezes, rhonchi or rales  Chest wall:    No tenderness to palpitation  Heart:    Regular rate and rhythm without murmurs, gallops, rubs  Abdomen:     Soft, non-tender, nondistended, normal bowel sounds, no organomegaly  Genitalia:    deferred  Rectal:    deferred  Extremities:   No clubbing, cyanosis or edema.  Pulses:   2+ and symmetric all extremities  Skin:   Skin color, texture, turgor normal, no rashes or lesions  Lymph nodes:   Cervical, supraclavicular, and axillary nodes normal  Neurologic:   CNII-XII intact. Normal strength, sensation and reflexes      throughout    Labs on Admission:  Basic Metabolic Panel:  Recent Labs Lab 12/29/13 1945  NA 132*  K 6.3*  CL 96  CO2 26  GLUCOSE 65*  BUN 28*  CREATININE 2.17*  CALCIUM 9.4   Liver Function Tests: No results found for this basename: AST, ALT, ALKPHOS, BILITOT, PROT, ALBUMIN,  in the  last 168 hours No results found for this basename: LIPASE, AMYLASE,  in the last 168 hours No results found for this basename: AMMONIA,  in the last 168 hours CBC:  Recent Labs Lab 12/29/13 1945  WBC 7.3  NEUTROABS 4.4  HGB 9.4*  HCT 27.8*  MCV 91.7  PLT 378   Cardiac Enzymes: No results found for this basename: CKTOTAL, CKMB, CKMBINDEX, TROPONINI,  in the last 168 hours  BNP (last 3 results) No results found for this basename: PROBNP,  in the last 8760 hours  CBG:  Recent Labs Lab 12/25/13 1258 12/25/13 1326 12/25/13 1432 12/29/13 2100 12/29/13 2121  GLUCAP 89 224* 300* 44* 62*    Radiological Exams on Admission: No results found.  EKG: Independently reviewed.  Assessment/Plan Principal Problem:   Acute renal failure Active Problems:   Diabetes mellitus type 1   Hyperkalemia   1. AKI - elevated creatinine, part of the creatinine elevation could be due to the bactrim, but bactrim alone would not explain the hyperkalemia.  Will hold bactrim, NSAIDs, and ACEi, give kayexelate, gentle hydration, monitor intake and output, re check labs in AM.  Tele monitor and Q4H potassium checks for hyperkalemia.  Potassium 6.2 in ED, unclear what it was in PCPs office. 2. DM1 - continue patient on current home insulin regimen.   Code Status: Full Code  Family Communication: Daughter at bedside Disposition Plan: Admit to inpatient   Time spent: 70 min  Tyteanna Ost M. [facility] Pager [phone]  If 7AM-7PM, please contact the day team taking care of the patient [email or web address] Password [facility] 12/29/2013, 9:39 PM

## 2013-12-29 NOTE — ED Notes (Signed)
Pt sent from PCP for high potassium, high creatinine, n/v, anemia. PCP states she has a kidney problem.

## 2013-12-30 ENCOUNTER — Observation Stay (HOSPITAL_COMMUNITY): Payer: Medicare Other

## 2013-12-30 DIAGNOSIS — N182 Chronic kidney disease, stage 2 (mild): Secondary | ICD-10-CM | POA: Diagnosis present

## 2013-12-30 DIAGNOSIS — E785 Hyperlipidemia, unspecified: Secondary | ICD-10-CM | POA: Diagnosis present

## 2013-12-30 DIAGNOSIS — I10 Essential (primary) hypertension: Secondary | ICD-10-CM | POA: Diagnosis present

## 2013-12-30 DIAGNOSIS — L089 Local infection of the skin and subcutaneous tissue, unspecified: Secondary | ICD-10-CM | POA: Diagnosis present

## 2013-12-30 DIAGNOSIS — D649 Anemia, unspecified: Secondary | ICD-10-CM | POA: Diagnosis present

## 2013-12-30 LAB — CBC
HCT: 23.6 % — ABNORMAL LOW (ref 36.0–46.0)
Hemoglobin: 8 g/dL — ABNORMAL LOW (ref 12.0–15.0)
MCH: 31.5 pg (ref 26.0–34.0)
MCHC: 33.9 g/dL (ref 30.0–36.0)
MCV: 92.9 fL (ref 78.0–100.0)
Platelets: 316 10*3/uL (ref 150–400)
RBC: 2.54 MIL/uL — ABNORMAL LOW (ref 3.87–5.11)
RDW: 13.4 % (ref 11.5–15.5)
WBC: 5.6 10*3/uL (ref 4.0–10.5)

## 2013-12-30 LAB — BASIC METABOLIC PANEL
BUN: 24 mg/dL — ABNORMAL HIGH (ref 6–23)
CO2: 25 mEq/L (ref 19–32)
Calcium: 7.8 mg/dL — ABNORMAL LOW (ref 8.4–10.5)
Chloride: 100 mEq/L (ref 96–112)
Creatinine, Ser: 1.7 mg/dL — ABNORMAL HIGH (ref 0.50–1.10)
GFR calc Af Amer: 34 mL/min — ABNORMAL LOW (ref >90)
GFR calc non Af Amer: 29 mL/min — ABNORMAL LOW (ref >90)
Glucose, Bld: 258 mg/dL — ABNORMAL HIGH (ref 70–99)
Potassium: 5.6 mEq/L — ABNORMAL HIGH (ref 3.7–5.3)
Sodium: 134 mEq/L — ABNORMAL LOW (ref 137–147)

## 2013-12-30 LAB — URINALYSIS, ROUTINE W REFLEX MICROSCOPIC
Bilirubin Urine: NEGATIVE
Glucose, UA: 250 mg/dL — AB
Hgb urine dipstick: NEGATIVE
Ketones, ur: NEGATIVE mg/dL
Leukocytes, UA: NEGATIVE
Nitrite: NEGATIVE
Protein, ur: NEGATIVE mg/dL
Specific Gravity, Urine: 1.013 (ref 1.005–1.030)
Urobilinogen, UA: 0.2 mg/dL (ref 0.0–1.0)
pH: 7.5 (ref 5.0–8.0)

## 2013-12-30 LAB — GLUCOSE, CAPILLARY
Glucose-Capillary: 368 mg/dL — ABNORMAL HIGH (ref 70–99)
Glucose-Capillary: 348 mg/dL — ABNORMAL HIGH (ref 70–99)
Glucose-Capillary: 148 mg/dL — ABNORMAL HIGH (ref 70–99)
Glucose-Capillary: 215 mg/dL — ABNORMAL HIGH (ref 70–99)
Glucose-Capillary: 168 mg/dL — ABNORMAL HIGH (ref 70–99)
Glucose-Capillary: 135 mg/dL — ABNORMAL HIGH (ref 70–99)
Glucose-Capillary: 113 mg/dL — ABNORMAL HIGH (ref 70–99)
Glucose-Capillary: 63 mg/dL — ABNORMAL LOW (ref 70–99)
Glucose-Capillary: 135 mg/dL — ABNORMAL HIGH (ref 70–99)

## 2013-12-30 LAB — POTASSIUM
Potassium: 4.8 mEq/L (ref 3.7–5.3)
Potassium: 5.3 mEq/L (ref 3.7–5.3)
Potassium: 5.4 mEq/L — ABNORMAL HIGH (ref 3.7–5.3)
Potassium: 5.5 mEq/L — ABNORMAL HIGH (ref 3.7–5.3)

## 2013-12-30 LAB — NA AND K (SODIUM & POTASSIUM), RAND UR
Potassium Urine: 21 mEq/L
Sodium, Ur: 34 mEq/L

## 2013-12-30 LAB — CREATININE, URINE, RANDOM: Creatinine, Urine: 60.92 mg/dL

## 2013-12-30 MED ORDER — INSULIN ASPART 100 UNIT/ML ~~LOC~~ SOLN
2.0000 [IU] | Freq: Three times a day (TID) | SUBCUTANEOUS | Status: DC
  Administered 2013-12-30 – 2014-01-01 (×8): 2 [IU] via SUBCUTANEOUS

## 2013-12-30 MED ORDER — SODIUM POLYSTYRENE SULFONATE 15 GM/60ML PO SUSP
15.0000 g | Freq: Once | ORAL | Status: AC
  Administered 2013-12-30: 15 g via ORAL
  Filled 2013-12-30 (×2): qty 60

## 2013-12-30 MED ORDER — INSULIN ASPART 100 UNIT/ML ~~LOC~~ SOLN
0.0000 [IU] | Freq: Three times a day (TID) | SUBCUTANEOUS | Status: DC
  Administered 2013-12-30 – 2013-12-31 (×3): 1 [IU] via SUBCUTANEOUS
  Administered 2014-01-01: 3 [IU] via SUBCUTANEOUS
  Administered 2014-01-01: 2 [IU] via SUBCUTANEOUS

## 2013-12-30 MED ORDER — INSULIN ASPART 100 UNIT/ML ~~LOC~~ SOLN: 2.0000 [IU] | Freq: Three times a day (TID) | SUBCUTANEOUS | Status: DC

## 2013-12-30 NOTE — Progress Notes (Addendum)
PROGRESS NOTE  Danielle Clarke:[id number] DOB: [birth year] DOA: 12/29/2013 PCP: Mickie Hillier, MD  HPI / interval history Danielle Clarke is a 73 y.o. female who presents to the ED after routine blood work done at her PCPs office this weekend demonstrated elevated creatinine and hyperkalemia. Patient has been having no symptoms. Recently she has been being treated for a toe infection with hyperbaric oxygen at a wound clinic. She has been taking bactrim since November for her toe. She reports no other medication changes other than decreased insulin before treatments to prevent hypoglycemia.  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 Discontinue her ACE inhibitor and Bactrim. Renal function improved overnight with mild hydration. Obtain renal ultrasound. Obtain urinalysis. Hyperkalemia - in the setting of acute on chronic renal failure. Status post Kayexalate last night, potassium persistently elevated 5.6 this morning, repeat dose of Kayexalate, K 4.8 in the afternoon. Repeat BMP tomorrow morning. Diabetes mellitus, type 1 - patient diagnosed with diabetes when she was 14. Continue home regimen of insulin. History of syncopal episodes - for which she was admitted last year. She saw neurology as an outpatient. No further syncopal episodes in the recent months. She is back to driving. Hypertension - will discontinue the Accupril given a renal failure. Monitor blood pressure for now. Hyperlipidemia - continue simvastatin. Left toe infection - it looks clean without any signs of active infection. She's been on Bactrim for about 3 months. Discontinue that now. Anemia - ?due to chronic disease vs renal disease vs others. No evidence of bleeding. Obtain iron studies.  Diet: Carb Modified Fluids: Normal saline at 75 cc per hour DVT Prophylaxis: Heparin  subcutaneous  Code Status: Full code Family Communication: None  Disposition Plan: Home when medically ready  Consultants:  None  Procedures:  None   Antibiotics - None  HPI/Subjective: Feeling well this morning, she has no complaints.  Objective: Filed Vitals:   12/29/13 1838 12/29/13 2243 12/30/13 0445  BP: 98/41 125/31 139/42  Pulse: 88 75 79  Temp: 98.6 F (37 C) 98.3 F (36.8 C) 98 F (36.7 C)  TempSrc: Oral Oral Oral  Resp: 16 12 12   Height:  5\' 2"  (1.575 m)   Weight:  53.933 kg (118 lb 14.4 oz)   SpO2: 98% 99% 100%    Intake/Output Summary (Last 24 hours) at 12/30/13 0737 Last data filed at 12/30/13 0732  Gross per 24 hour  Intake 981.25 ml  Output    300 ml  Net 681.25 ml   Filed Weights   12/29/13 2243  Weight: 53.933 kg (118 lb 14.4 oz)    Exam:   General:  NAD  Cardiovascular: regular rate and rhythm, without MRG  Respiratory: good air movement, clear to auscultation throughout, no wheezing, ronchi or rales  Abdomen: soft, not tender to palpation, positive bowel sounds  MSK: no peripheral edema  Neuro: Grossly nonfocal  Data Reviewed: Basic Metabolic Panel:  Recent Labs Lab 12/29/13 1945 12/30/13 0001 12/30/13 0350  NA 132*  --  134*  K 6.3* 5.3 5.6*  5.4*  CL 96  --  100  CO2 26  --  25  GLUCOSE 65*  --  258*  BUN 28*  --  24*  CREATININE 2.17*  --  1.70*  CALCIUM 9.4  --  7.8*   CBC:  Recent Labs Lab 12/29/13 1945 12/30/13  0350  WBC 7.3 5.6  NEUTROABS 4.4  --   HGB 9.4* 8.0*  HCT 27.8* 23.6*  MCV 91.7 92.9  PLT 378 316   CBG:  Recent Labs Lab 12/29/13 2100 12/29/13 2121 12/29/13 2240 12/30/13 0247 12/30/13 0718  GLUCAP 44* 62* 148* 215* 168*    Scheduled Meds: . sodium chloride   Intravenous STAT  . aspirin EC  81 mg Oral Daily  . heparin  5,000 Units Subcutaneous Q8H  . insulin aspart  2-8 Units Subcutaneous See admin instructions  . insulin glargine  10 Units Subcutaneous q morning - 10a  .  simvastatin  40 mg Oral QPM  . sodium chloride  1,000 mL Intravenous Once  . sodium chloride  3 mL Intravenous Q12H  . sodium polystyrene  15 g Oral Once  . traMADol  100 mg Oral QHS   Continuous Infusions: . sodium chloride 75 mL/hr at 12/30/13 [id number]    Principal Problem:   Acute renal failure Active Problems:   Diabetes mellitus type 1   Hyperkalemia   CKD (chronic kidney disease) stage 2, GFR 60-89 ml/min   Essential hypertension, benign   Other and unspecified hyperlipidemia   Toe infection   Anemia  Time spent: 3035  Danielle Pertostin Daziah Hesler, MD [facility] Pager [phone]. If 7 PM - 7 AM, please contact night-coverage at [email or web address], password [email or web address] 12/30/2013, 7:37 AM  LOS: 1 day

## 2013-12-30 NOTE — Progress Notes (Signed)
UR completed 

## 2013-12-30 NOTE — Progress Notes (Signed)
Danielle PoagK. Kirby paged to verify frequency of sliding scale insulin order. I am unsure if it is with every CGB or just with meals. Pt states that at home she only does it with meals.

## 2013-12-30 NOTE — Progress Notes (Signed)
Nutrition Brief Note  Patient identified on the Malnutrition Screening Tool (MST) Report  Wt Readings from Last 15 Encounters:  12/29/13 118 lb 14.4 oz (53.933 kg)  08/05/13 126 lb (57.153 kg)  01/03/13 129 lb (58.514 kg)  06/27/12 133 lb 8 oz (60.555 kg)  02/11/08 142 lb (64.411 kg)    Body mass index is 21.74 kg/(m^2). Patient meets criteria for Normal weight based on current BMI.   Current diet order is Carb Modified, patient is consuming approximately 75% of meals at this time. Labs and medications reviewed.   Pt reported intentional weight loss of 20 lbs through portion control and increase in physical activity over several years. Eats small amounts frequently throughout the day. Has had some intermittent nausea since 08/2013, which pt attributes to medications for toe infection. Nausea is improving, and pt eating > 75% of meals during admit. Blood glucose remains well controlled at home, no questions regarding DM diet recommendations  No nutrition interventions warranted at this time. If nutrition issues arise, please consult RD.   Lloyd HugerSarah F Shalen Petrak MS RD LDN Clinical Dietitian [phone]

## 2013-12-31 LAB — BASIC METABOLIC PANEL
BUN: 14 mg/dL (ref 6–23)
BUN: 7 mg/dL (ref 6–23)
CO2: 27 mEq/L (ref 19–32)
CO2: 27 mEq/L (ref 19–32)
Calcium: 8.3 mg/dL — ABNORMAL LOW (ref 8.4–10.5)
Calcium: 8.1 mg/dL — ABNORMAL LOW (ref 8.4–10.5)
Chloride: 109 mEq/L (ref 96–112)
Chloride: 100 mEq/L (ref 96–112)
Creatinine, Ser: 0.94 mg/dL (ref 0.50–1.10)
Creatinine, Ser: 0.75 mg/dL (ref 0.50–1.10)
GFR calc Af Amer: 69 mL/min — ABNORMAL LOW (ref >90)
GFR calc Af Amer: >90 mL/min (ref >90)
GFR calc non Af Amer: 60 mL/min — ABNORMAL LOW (ref >90)
GFR calc non Af Amer: 83 mL/min — ABNORMAL LOW (ref >90)
Glucose, Bld: 145 mg/dL — ABNORMAL HIGH (ref 70–99)
Glucose, Bld: 267 mg/dL — ABNORMAL HIGH (ref 70–99)
Potassium: 5.4 mEq/L — ABNORMAL HIGH (ref 3.7–5.3)
Potassium: 4.8 mEq/L (ref 3.7–5.3)
Sodium: 142 mEq/L (ref 137–147)
Sodium: 136 mEq/L — ABNORMAL LOW (ref 137–147)

## 2013-12-31 LAB — CBC
HCT: 25.6 % — ABNORMAL LOW (ref 36.0–46.0)
Hemoglobin: 8.4 g/dL — ABNORMAL LOW (ref 12.0–15.0)
MCH: 31.5 pg (ref 26.0–34.0)
MCHC: 32.8 g/dL (ref 30.0–36.0)
MCV: 95.9 fL (ref 78.0–100.0)
Platelets: 335 10*3/uL (ref 150–400)
RBC: 2.67 MIL/uL — ABNORMAL LOW (ref 3.87–5.11)
RDW: 13.6 % (ref 11.5–15.5)
WBC: 5.3 10*3/uL (ref 4.0–10.5)

## 2013-12-31 LAB — FERRITIN: Ferritin: 60 ng/mL (ref 10–291)

## 2013-12-31 LAB — IRON AND TIBC
Iron: 69 ug/dL (ref 42–135)
Saturation Ratios: 32 % (ref 20–55)
TIBC: 218 ug/dL — ABNORMAL LOW (ref 250–470)
UIBC: 149 ug/dL (ref 125–400)

## 2013-12-31 LAB — GLUCOSE, CAPILLARY
Glucose-Capillary: 124 mg/dL — ABNORMAL HIGH (ref 70–99)
Glucose-Capillary: 141 mg/dL — ABNORMAL HIGH (ref 70–99)
Glucose-Capillary: 173 mg/dL — ABNORMAL HIGH (ref 70–99)
Glucose-Capillary: 197 mg/dL — ABNORMAL HIGH (ref 70–99)
Glucose-Capillary: 252 mg/dL — ABNORMAL HIGH (ref 70–99)

## 2013-12-31 MED ORDER — SODIUM POLYSTYRENE SULFONATE 15 GM/60ML PO SUSP
15.0000 g | Freq: Once | ORAL | Status: AC
Start: 2013-12-31 — End: 2013-12-31
  Administered 2013-12-31: 15 g via ORAL
  Filled 2013-12-31: qty 60

## 2013-12-31 MED ORDER — INSULIN GLARGINE 100 UNIT/ML ~~LOC~~ SOLN
5.0000 [IU] | Freq: Every morning | SUBCUTANEOUS | Status: DC
  Administered 2013-12-31 – 2014-01-01 (×2): 5 [IU] via SUBCUTANEOUS
  Filled 2013-12-31 (×2): qty 0.05

## 2013-12-31 MED ORDER — ONDANSETRON HCL 4 MG/2ML IJ SOLN
4.0000 mg | Freq: Four times a day (QID) | INTRAMUSCULAR | Status: DC
  Administered 2013-12-31 – 2014-01-01 (×3): 4 mg via INTRAVENOUS
  Filled 2013-12-31 (×4): qty 2

## 2013-12-31 NOTE — Progress Notes (Signed)
Hypoglycemic Event  CBG: 63  Treatment: 15 GM carbohydrate snack  Symptoms: None  Follow-up CBG: Time: 23:06 CBG Result: 135  Possible Reasons for Event: Unknown  Comments/MD notified: Will recheck CBG at 3am    [name], Danielle Clarke Danielle  Remember to initiate Hypoglycemia Order Set & complete

## 2013-12-31 NOTE — Progress Notes (Signed)
Nutrition Services  RN verbal consult for Low Potassium diet education. Provided pt with "High Potassium Food List" from [employer]. Pt normally consumes spinach, brussell sprouts and broccoli, all of which are high potassium foods. Recommend to eat in moderation and small amounts. Pt appreciative of handout and verbalized understanding.  Please re-consult as needed.  Lloyd HugerSarah F Kortny Lirette MS RD LDN Clinical Dietitian Pager:[phone]

## 2013-12-31 NOTE — Progress Notes (Signed)
PROGRESS NOTE  Danielle FordyceJacqueline J Ganaway WUJ:[id number] DOB: March 15, 1941 DOA: 12/29/2013 PCP: Mickie HillierLITTLE,KEVIN LORNE, MD  HPI / interval history 6671 ? admitted 12/29/13 hyperkalemia, AKI. In the setting of chronic Bactrim therapyfor a toe infection with hyperbaric oxygen at a wound clinic-she's been on antibiotics since mid-November She reports no other medication changes other than decreased insulin before treatments to prevent hypoglycemia.  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 Discontinue her ACE inhibitor and Bactrim. Renal function improved overnight with mild hydration. Renal ultrasound benign Hyperkalemia - in the setting of acute on chronic renal failure. Potassium still elevated,? RTA type IV-repeat potassium 8 PM, a.m.-could also be pseudohyperkalemia-will monitor and consult renal needed in the a.m. Diabetes mellitus, type 1 - patient diagnosed with diabetes when she was 14. Continue home regimen of Lantus 10 units every afternoon, she is to continue her home sliding scale insulin on discharge. History of syncopal episodes - for which she was admitted last year. She saw neurology as an outpatient. No further syncopal episodes in the recent months. She is back to driving. This was likely related to hypoglycemia at that time Hypertension - will discontinue the Accupril 40 mg every morning given renal failure. Monitor blood pressure for now-might need to add a calcium channel blocker as an outpatient Hyperlipidemia - continue simvastatin 40 mg daily  Left toe infection - it looks clean without any signs of active infection. She's been on Bactrim for about 3 months. Discontinue that now. Anemia of likely chronic disease.  Needs outpatient colonoscopy if not done Carotid artery disease-routinely monitored by Dr. Mendel RyderH. Smith.  Reportedly 70% blockage-deferred to outpatient  physician further therapies  Diet: Carb Modified DVT Prophylaxis: Heparin subcutaneous  Code Status: Full code Family Communication: None  Disposition Plan: Home when medically ready  Consultants:  None  Procedures:  None   Antibiotics - None  HPI/Subjective:  Smiling happy oriented Described in great detail her insulin regimen No pain no nausea no vomiting No fever no chills.  Objective: Filed Vitals:   12/30/13 1300 12/30/13 2215 12/31/13 0423 12/31/13 1327  BP: 116/34 137/42 139/39 150/38  Pulse: 73 78 75 83  Temp: 98.5 F (36.9 C) 98.3 F (36.8 C) 97.9 F (36.6 C) 98.4 F (36.9 C)  TempSrc: Oral Oral Oral Oral  Resp: 16 20 16 18   Height:      Weight:      SpO2: 99% 97% 97% 98%    Intake/Output Summary (Last 24 hours) at 12/31/13 1546 Last data filed at 12/31/13 1328  Gross per 24 hour  Intake   1700 ml  Output      0 ml  Net   1700 ml   Filed Weights   12/29/13 2243  Weight: 53.933 kg (118 lb 14.4 oz)    Exam:   General:  NAD  Cardiovascular: regular rate and rhythm, without MRG  Respiratory: good air movement, clear to auscultation throughout, no wheezing, ronchi or rales  Abdomen: soft, not tender to palpation, positive bowel sounds  MSK: no peripheral edema  Neuro: Grossly nonfocal  Data Reviewed: Basic Metabolic Panel:  Recent Labs Lab 12/29/13 1945 12/30/13 0001 12/30/13 0350 12/30/13 0904 12/30/13 1250 12/31/13 0359  NA 132*  --  134*  --   --  142  K 6.3* 5.3 5.6*  5.4* 5.5* 4.8 5.4*  CL 96  --  100  --   --  109  CO2 26  --  25  --   --  27  GLUCOSE 65*  --  258*  --   --  145*  BUN 28*  --  24*  --   --  14  CREATININE 2.17*  --  1.70*  --   --  0.94  CALCIUM 9.4  --  7.8*  --   --  8.3*   CBC:  Recent Labs Lab 12/29/13 1945 12/30/13 0350 12/31/13 0359  WBC 7.3 5.6 5.3  NEUTROABS 4.4  --   --   HGB 9.4* 8.0* 8.4*  HCT 27.8* 23.6* 25.6*  MCV 91.7 92.9 95.9  PLT 378 316 335   CBG:  Recent Labs Lab  12/30/13 2217 12/30/13 2306 12/31/13 0407 12/31/13 0738 12/31/13 1141  GLUCAP 63* 135* 124* 141* 173*    Scheduled Meds: . aspirin EC  81 mg Oral Daily  . heparin  5,000 Units Subcutaneous Q8H  . insulin aspart  0-5 Units Subcutaneous TID WC  . insulin aspart  2 Units Subcutaneous TID WC  . insulin glargine  5 Units Subcutaneous q morning - 10a  . ondansetron (ZOFRAN) IV  4 mg Intravenous Q6H  . simvastatin  40 mg Oral QPM  . sodium chloride  1,000 mL Intravenous Once  . sodium chloride  3 mL Intravenous Q12H  . traMADol  100 mg Oral QHS   Continuous Infusions: . sodium chloride 75 mL/hr at 12/31/13 0818    Principal Problem:   Acute renal failure Active Problems:   Diabetes mellitus type 1   Hyperkalemia   CKD (chronic kidney disease) stage 2, GFR 60-89 ml/min   Essential hypertension, benign   Other and unspecified hyperlipidemia   Toe infection   Anemia  Time spent: 70  Pleas Koch, MD [facility] [phone]  12/31/2013, 3:46 PM  LOS: 2 days

## 2014-01-01 LAB — BASIC METABOLIC PANEL
BUN: 11 mg/dL (ref 6–23)
CO2: 25 mEq/L (ref 19–32)
Calcium: 8.1 mg/dL — ABNORMAL LOW (ref 8.4–10.5)
Chloride: 99 mEq/L (ref 96–112)
Creatinine, Ser: 0.76 mg/dL (ref 0.50–1.10)
GFR calc Af Amer: >90 mL/min (ref >90)
GFR calc non Af Amer: 83 mL/min — ABNORMAL LOW (ref >90)
Glucose, Bld: 356 mg/dL — ABNORMAL HIGH (ref 70–99)
Potassium: 4.5 mEq/L (ref 3.7–5.3)
Sodium: 133 mEq/L — ABNORMAL LOW (ref 137–147)

## 2014-01-01 LAB — GLUCOSE, CAPILLARY
Glucose-Capillary: 347 mg/dL — ABNORMAL HIGH (ref 70–99)
Glucose-Capillary: 281 mg/dL — ABNORMAL HIGH (ref 70–99)
Glucose-Capillary: 293 mg/dL — ABNORMAL HIGH (ref 70–99)

## 2014-01-01 NOTE — Discharge Summary (Signed)
Physician Discharge Summary  MINERVIA OSSO ZOX:[id number] DOB: [birth year] DOA: 12/29/2013  PCP: Gaye Alken, MD  Admit date: 12/29/2013 Discharge date: 01/01/2014  Time spent: 35 minutes  Recommendations for Outpatient Follow-up:  1. Review failure likely secondary to combination of ACE inhibitor and Bactrim-carefully consider use of these medications in the future with lab testing please 2. Please repeat basic metabolic panel in about one week 3. She may need up titration of her long-acting insulin from 10 units to about 12 or 15 units 4. Further care as per wound care as an outpatient 5. Follow up with endocrinology as needed 6. Followup as an outpatient with cardiology for routine monitoring of carotid artery disease  Discharge Diagnoses:  Principal Problem:   Acute renal failure Active Problems:   Diabetes mellitus type 1   Hyperkalemia   CKD (chronic kidney disease) stage 2, GFR 60-89 ml/min   Essential hypertension, benign   Other and unspecified hyperlipidemia   Toe infection   Anemia   Discharge Condition: Stable  Diet recommendation: Carbohydrate modified  Filed Weights   12/29/13 2243  Weight: 53.933 kg (118 lb 14.4 oz)    History of present illness:  36 ? admitted 12/29/13 hyperkalemia, AKI. In the setting of chronic Bactrim therapyfor a toe infection with hyperbaric oxygen at a wound clinic-she's been on antibiotics since mid-November She reports no other medication changes other than decreased insulin before treatments to prevent hypoglycemia.   Hospital Course:  Acute on chronic kidney injury - based on her GFR as far as 2013 she has a degree of chronic kidney disease, stage 2, likely due long-standing diabetes. Discontinue her ACE inhibitor and Bactrim. Renal function improved to premorbid state Renal ultrasound benign  Needs renal panel in about one week Hyperkalemia - in the setting of acute on chronic renal failure. Potassium came back down to  normal during hospital stay with 2 doses of Kayexalate and resolved as her renal failure resolved. On discharge her potassium is 4.5. Diabetes mellitus, type 1 - patient diagnosed with diabetes when she was 14. Continue home regimen of Lantus 10 units every afternoon, she is to continue her home sliding scale insulin on discharge. Her blood sugars were slightly elevated during hospital stay but as she has had syncopal episodes with hypoglycemia she will need close followup with endocrinology to monitor this,  History of syncopal episodes - for which she was admitted last year. She saw neurology as an outpatient. No further syncopal episodes in the recent months. She is back to driving. This was likely related to hypoglycemia at that time  Hypertension - will discontinue the Accupril 40 mg every morning given renal failure. Monitor blood pressure for now-might need to add a calcium channel blocker as an outpatient-could alternatively use a lower dose of ACE inhibitor .  Hyperlipidemia - continue simvastatin 40 mg daily  Left toe infection - it looks clean without any signs of active infection. She's been on Bactrim for about 3 months. Discontinue that now.  further care as per Dr. Ardath Sax of wound care -she gets hyperbaric oxygen to heal this area based on my assessment of her wound , it does not appear that she needs this any longer  Anemia of likely chronic disease. Needs outpatient colonoscopy if not done  Carotid artery disease-routinely monitored by Dr. Mendel Ryder. Reportedly 70% blockage-deferred to outpatient physician further therapies    Consultants:  None Procedures:  None Antibiotics - None  Discharge Exam: Filed Vitals:   01/01/14  0642  BP: 143/44  Pulse: 70  Temp: 98.3 F (36.8 C)  Resp: 20    General: Alert pleasant oriented no apparent distress  Cardiovascular: S1-S2 no murmur or gallop  Respiratory: Clinically clear   Discharge Instructions  Discharge Orders    Future Orders Complete By Expires   Diet - low sodium heart healthy  As directed    Discharge instructions  As directed    Comments:     Stop Accupril and Bactrim-these medications may have caused your potassium to go up and might have also caused the renal failure. You might need a different blood pressure medicine as an OP. Use your regular doses of insulin for now.  If needed, you will probably have to a chest tear long-acting insulin at by one to 2 units at night U. will need her lab work at Dr. Tommas OlpBarnes's office   Increase activity slowly  As directed        Medication List    STOP taking these medications       ONE TOUCH ULTRA TEST test strip  Generic drug:  glucose blood     quinapril 40 MG tablet  Commonly known as:  ACCUPRIL     sulfamethoxazole-trimethoprim 400-80 MG per tablet  Commonly known as:  BACTRIM,SEPTRA      TAKE these medications       alendronate 40 MG tablet  Commonly known as:  FOSAMAX  Take 40 mg by mouth every 7 (seven) days. Take with a full glass of water on an empty stomach every Wednesday morning. If pt's blood sugar is in good condition she takes.     aspirin EC 81 MG tablet  Take 81 mg by mouth daily.     cholecalciferol 1000 UNITS tablet  Commonly known as:  VITAMIN D  Take 2,000 Units by mouth at bedtime.     Chromium 1000 MCG Tabs  Take 1,000 mcg by mouth every morning.     CINNAMON PO  Take 1,000 mg by mouth 4 (four) times daily.     cyclobenzaprine 5 MG tablet  Commonly known as:  FLEXERIL  Take 5 mg by mouth daily as needed (muscle spasm).     diclofenac 75 MG EC tablet  Commonly known as:  VOLTAREN  Take 75 mg by mouth every morning.     Flaxseed (Linseed) 1000 MG Caps  Take 1,000 mg by mouth 2 (two) times daily.     GLUCOSAMINE-MSM PO  Take 1 tablet by mouth 2 (two) times daily. 1500mg      insulin aspart 100 UNIT/ML injection  Commonly known as:  novoLOG  Inject 2-8 Units into the skin See admin instructions. Sliding  scale. Base units = Reading below 80 subtract 1; 80-150 take 2 units (base dose); 151-200 add 1 unit; 201-250 add 2 units; 251-300 add 3 units; 301-350 add 4 units; 351-400 add 5 units; 401-450 add 6 units     insulin glargine 100 UNIT/ML injection  Commonly known as:  LANTUS  Inject 10 Units into the skin every morning.     multivitamin with minerals Tabs tablet  Take 1 tablet by mouth every morning.     omega-3 acid ethyl esters 1 G capsule  Commonly known as:  LOVAZA  Take 2 g by mouth 3 (three) times daily.     simvastatin 40 MG tablet  Commonly known as:  ZOCOR  Take 40 mg by mouth every evening.     traMADol 50 MG tablet  Commonly  known as:  ULTRAM  Take 100 mg by mouth at bedtime.     TURMERIC PO  Take 1 capsule by mouth daily at 12 noon.     VENTOLIN HFA 108 (90 BASE) MCG/ACT inhaler  Generic drug:  albuterol     VITAMIN C PO  Take 1 tablet by mouth daily at 12 noon.       Allergies  Allergen Reactions  . Cefaclor Anaphylaxis  . Tetracycline Anaphylaxis  . Vancomycin Anaphylaxis  . Prednisone       The results of significant diagnostics from this hospitalization (including imaging, microbiology, ancillary and laboratory) are listed below for reference.    Significant Diagnostic Studies: US Renal  12/30/2013   CLINICAL DATA:  Renal failure  EXAM: RENAL/URINARY TRACT ULTRASOUND COMPLETE  COMPARISON:  None.  FINDINGS: Right Kidney:  Length: 10.0 cm in length. Echogenicity within normal limits. No mass or hydronephrosis visualized.  Left Kidney:  Length: 10.0 cm in length. Echogenicity within normal limits. No mass or hydronephrosis visualized.  Bladder:  Appears normal for degree of bladder distention.  IMPRESSION: Negative   Electronically Signed   By: Marlan Palau M.D.   On: 12/30/2013 11:51    Microbiology: No results found for this or any previous visit (from the past 240 hour(s)).   Labs: Basic Metabolic Panel:  Recent Labs Lab 12/29/13 1945   12/30/13 0350 12/30/13 0904 12/30/13 1250 12/31/13 0359 12/31/13 1843 01/01/14 0330  NA 132*  --  134*  --   --  142 136* 133*  K 6.3*  < > 5.6*  5.4* 5.5* 4.8 5.4* 4.8 4.5  CL 96  --  100  --   --  109 100 99  CO2 26  --  25  --   --  27 27 25   GLUCOSE 65*  --  258*  --   --  145* 267* 356*  BUN 28*  --  24*  --   --  14 7 11   CREATININE 2.17*  --  1.70*  --   --  0.94 0.75 0.76  CALCIUM 9.4  --  7.8*  --   --  8.3* 8.1* 8.1*  < > = values in this interval not displayed. Liver Function Tests: No results found for this basename: AST, ALT, ALKPHOS, BILITOT, PROT, ALBUMIN,  in the last 168 hours No results found for this basename: LIPASE, AMYLASE,  in the last 168 hours No results found for this basename: AMMONIA,  in the last 168 hours CBC:  Recent Labs Lab 12/29/13 1945 12/30/13 0350 12/31/13 0359  WBC 7.3 5.6 5.3  NEUTROABS 4.4  --   --   HGB 9.4* 8.0* 8.4*  HCT 27.8* 23.6* 25.6*  MCV 91.7 92.9 95.9  PLT 378 316 335   Cardiac Enzymes: No results found for this basename: CKTOTAL, CKMB, CKMBINDEX, TROPONINI,  in the last 168 hours BNP: BNP (last 3 results) No results found for this basename: PROBNP,  in the last 8760 hours CBG:  Recent Labs Lab 12/31/13 0738 12/31/13 1141 12/31/13 1716 12/31/13 2053 01/01/14 0240  GLUCAP 141* 173* 197* 252* 347*       Signed:  Rhetta Mura  [facility] 01/01/2014, 7:25 AM

## 2014-01-01 NOTE — Progress Notes (Signed)
Discussed discharge instructions with patient, she understands everything and does not have any questions.  Patient is feeling good today and ready to go home.

## 2014-01-01 NOTE — Progress Notes (Signed)
RN notified PCP on call that the CBG was 347 @ 0300. Awaiting any new orders.

## 2014-01-05 LAB — GLUCOSE, CAPILLARY
Glucose-Capillary: 257 mg/dL — ABNORMAL HIGH (ref 70–99)
Glucose-Capillary: 255 mg/dL — ABNORMAL HIGH (ref 70–99)

## 2014-01-06 LAB — GLUCOSE, CAPILLARY
Glucose-Capillary: 210 mg/dL — ABNORMAL HIGH (ref 70–99)
Glucose-Capillary: 235 mg/dL — ABNORMAL HIGH (ref 70–99)

## 2014-01-07 LAB — GLUCOSE, CAPILLARY
Glucose-Capillary: 239 mg/dL — ABNORMAL HIGH (ref 70–99)
Glucose-Capillary: 241 mg/dL — ABNORMAL HIGH (ref 70–99)

## 2014-01-08 LAB — GLUCOSE, CAPILLARY
Glucose-Capillary: 274 mg/dL — ABNORMAL HIGH (ref 70–99)
Glucose-Capillary: 337 mg/dL — ABNORMAL HIGH (ref 70–99)

## 2014-01-09 LAB — GLUCOSE, CAPILLARY
Glucose-Capillary: 209 mg/dL — ABNORMAL HIGH (ref 70–99)
Glucose-Capillary: 221 mg/dL — ABNORMAL HIGH (ref 70–99)

## 2014-01-12 LAB — GLUCOSE, CAPILLARY
Glucose-Capillary: 328 mg/dL — ABNORMAL HIGH (ref 70–99)
Glucose-Capillary: 315 mg/dL — ABNORMAL HIGH (ref 70–99)

## 2014-01-15 LAB — GLUCOSE, CAPILLARY
Glucose-Capillary: 140 mg/dL — ABNORMAL HIGH (ref 70–99)
Glucose-Capillary: 252 mg/dL — ABNORMAL HIGH (ref 70–99)

## 2014-01-16 LAB — GLUCOSE, CAPILLARY
Glucose-Capillary: 117 mg/dL — ABNORMAL HIGH (ref 70–99)
Glucose-Capillary: 133 mg/dL — ABNORMAL HIGH (ref 70–99)
Glucose-Capillary: 311 mg/dL — ABNORMAL HIGH (ref 70–99)

## 2014-01-19 LAB — GLUCOSE, CAPILLARY
Glucose-Capillary: 195 mg/dL — ABNORMAL HIGH (ref 70–99)
Glucose-Capillary: 272 mg/dL — ABNORMAL HIGH (ref 70–99)

## 2014-01-21 LAB — GLUCOSE, CAPILLARY
Glucose-Capillary: 311 mg/dL — ABNORMAL HIGH (ref 70–99)
Glucose-Capillary: 244 mg/dL — ABNORMAL HIGH (ref 70–99)

## 2014-01-26 ENCOUNTER — Encounter (HOSPITAL_BASED_OUTPATIENT_CLINIC_OR_DEPARTMENT_OTHER): Payer: Medicare Other | Attending: General Surgery

## 2014-01-26 DIAGNOSIS — E1169 Type 2 diabetes mellitus with other specified complication: Secondary | ICD-10-CM | POA: Insufficient documentation

## 2014-01-26 DIAGNOSIS — L97509 Non-pressure chronic ulcer of other part of unspecified foot with unspecified severity: Secondary | ICD-10-CM | POA: Insufficient documentation

## 2014-01-26 DIAGNOSIS — M9080 Osteopathy in diseases classified elsewhere, unspecified site: Secondary | ICD-10-CM | POA: Insufficient documentation

## 2014-01-26 DIAGNOSIS — M869 Osteomyelitis, unspecified: Secondary | ICD-10-CM | POA: Insufficient documentation

## 2014-01-26 LAB — GLUCOSE, CAPILLARY
Glucose-Capillary: 174 mg/dL — ABNORMAL HIGH (ref 70–99)
Glucose-Capillary: 231 mg/dL — ABNORMAL HIGH (ref 70–99)

## 2014-01-27 LAB — GLUCOSE, CAPILLARY
Glucose-Capillary: 104 mg/dL — ABNORMAL HIGH (ref 70–99)
Glucose-Capillary: 170 mg/dL — ABNORMAL HIGH (ref 70–99)
Glucose-Capillary: 321 mg/dL — ABNORMAL HIGH (ref 70–99)

## 2014-01-28 LAB — GLUCOSE, CAPILLARY
Glucose-Capillary: 276 mg/dL — ABNORMAL HIGH (ref 70–99)
Glucose-Capillary: 255 mg/dL — ABNORMAL HIGH (ref 70–99)

## 2014-01-30 LAB — GLUCOSE, CAPILLARY
Glucose-Capillary: 147 mg/dL — ABNORMAL HIGH (ref 70–99)
Glucose-Capillary: 348 mg/dL — ABNORMAL HIGH (ref 70–99)
Glucose-Capillary: 189 mg/dL — ABNORMAL HIGH (ref 70–99)
Glucose-Capillary: 239 mg/dL — ABNORMAL HIGH (ref 70–99)

## 2014-02-02 LAB — GLUCOSE, CAPILLARY
Glucose-Capillary: 186 mg/dL — ABNORMAL HIGH (ref 70–99)
Glucose-Capillary: 194 mg/dL — ABNORMAL HIGH (ref 70–99)

## 2014-02-06 LAB — GLUCOSE, CAPILLARY
Glucose-Capillary: 136 mg/dL — ABNORMAL HIGH (ref 70–99)
Glucose-Capillary: 261 mg/dL — ABNORMAL HIGH (ref 70–99)
Glucose-Capillary: 148 mg/dL — ABNORMAL HIGH (ref 70–99)
Glucose-Capillary: 117 mg/dL — ABNORMAL HIGH (ref 70–99)

## 2014-07-14 ENCOUNTER — Other Ambulatory Visit: Payer: Self-pay | Admitting: Neurosurgery

## 2014-07-20 ENCOUNTER — Encounter (HOSPITAL_COMMUNITY): Payer: Self-pay | Admitting: Pharmacy Technician

## 2014-07-21 NOTE — Pre-Procedure Instructions (Addendum)
JERILYNN FELDMEIER  07/21/2014   Your procedure is scheduled on:  Friday, September 4.  Report to [facility] Admitting at 6:45 AM.  Call this number if you have problems the morning of surgery: [phone]   Remember:   Do not eat food or drink liquids after midnight Thursday, September 3.   Take these medicines the morning of surgery with A SIP OF WATER: acetaminophen (TYLENOL), traMADol (ULTRAM).  May use inhaler, please bring Albuterol inhaler to the hospital with you.               Take 11 units of Lantus the morning of surgery. Do not take Novolog    STOP Vitamin D, Magnesium, Multiple Vitamins, Aspirin, Chromium, Cinnamon, Flaxseed, Glucosamine, Turmeric today   STOP/ Do not take Aspirin, Aleve, Naproxen, Advil, Ibuprofen, Motrin, Vitamins, Herbs, or Supplements starting today   Do not wear jewelry, make-up or nail polish.  Do not wear lotions, powders, or perfumes.   Do not shave 48 hours prior to surgery.   Do not bring valuables to the hospital.              [facility] is not responsible for any belongings or valuables.               Contacts, dentures or bridgework may not be worn into surgery.  Leave suitcase in the car. After surgery it may be brought to your room.  For patients admitted to the hospital, discharge time is determined by your treatment team.               Special Instructions: Review  [facility] - Preparing For Surgery.   Please read over the following fact sheets that you were given: Pain Booklet, Coughing and Deep Breathing and Surgical Site Infection Prevention

## 2014-07-22 ENCOUNTER — Encounter (HOSPITAL_COMMUNITY): Payer: Self-pay

## 2014-07-22 ENCOUNTER — Encounter (HOSPITAL_COMMUNITY)
Admission: RE | Admit: 2014-07-22 | Discharge: 2014-07-22 | Disposition: A | Discharge status: Home / Self Care | Payer: Medicare Other | Source: Ambulatory Visit | Attending: Neurosurgery | Admitting: Neurosurgery

## 2014-07-22 DIAGNOSIS — Z01818 Encounter for other preprocedural examination: Secondary | ICD-10-CM | POA: Diagnosis present

## 2014-07-22 DIAGNOSIS — Q762 Congenital spondylolisthesis: Secondary | ICD-10-CM | POA: Diagnosis present

## 2014-07-22 DIAGNOSIS — M4802 Spinal stenosis, cervical region: Secondary | ICD-10-CM | POA: Insufficient documentation

## 2014-07-22 HISTORY — DX: Essential (primary) hypertension: I10

## 2014-07-22 HISTORY — DX: Type 1 diabetes mellitus without complications: E10.9

## 2014-07-22 HISTORY — DX: Hyperlipidemia, unspecified: E78.5

## 2014-07-22 HISTORY — DX: Unspecified asthma, uncomplicated: J45.909

## 2014-07-22 LAB — BASIC METABOLIC PANEL
Anion gap: 12 (ref 5–15)
BUN: 13 mg/dL (ref 6–23)
CO2: 29 mEq/L (ref 19–32)
Calcium: 9.6 mg/dL (ref 8.4–10.5)
Chloride: 100 mEq/L (ref 96–112)
Creatinine, Ser: 0.78 mg/dL (ref 0.50–1.10)
GFR calc Af Amer: >90 mL/min (ref >90)
GFR calc non Af Amer: 82 mL/min — ABNORMAL LOW (ref >90)
Glucose, Bld: 169 mg/dL — ABNORMAL HIGH (ref 70–99)
Potassium: 5.3 mEq/L (ref 3.7–5.3)
Sodium: 141 mEq/L (ref 137–147)

## 2014-07-22 LAB — CBC
HCT: 35.3 % — ABNORMAL LOW (ref 36.0–46.0)
Hemoglobin: 11.7 g/dL — ABNORMAL LOW (ref 12.0–15.0)
MCH: 29.5 pg (ref 26.0–34.0)
MCHC: 33.1 g/dL (ref 30.0–36.0)
MCV: 89.1 fL (ref 78.0–100.0)
Platelets: 320 10*3/uL (ref 150–400)
RBC: 3.96 MIL/uL (ref 3.87–5.11)
RDW: 14.3 % (ref 11.5–15.5)
WBC: 6.6 10*3/uL (ref 4.0–10.5)

## 2014-07-22 LAB — SURGICAL PCR SCREEN
MRSA, PCR: NEGATIVE
Staphylococcus aureus: NEGATIVE

## 2014-07-22 NOTE — Progress Notes (Signed)
Patient states that she is only aware of levels C5- C6 being worked on. Will defer signing consent until after exact levels get clarified.

## 2014-07-22 NOTE — Progress Notes (Signed)
Spoke to [place] and told to tell patient to take 11 units of Lantus the am of surgery, not to take Novolog. Instructions updated patient educated and verbalized understanding

## 2014-07-30 NOTE — H&P (Signed)
Danielle Clarke is an 73 y.o. female.   Chief Complaint: neck pain HPI: patient complaining of neck pain with radiation to both upper extremities associated with numbness, weakness and poor balance. Had a cervical fracture about 2 years ago  Past Medical History  Diagnosis Date  . Arthritis   . Neck fracture     july 2013  . Hyperlipemia   . Hypertension     Dr. Katrinka Blazing ~ 2 years ago  . Asthmatic bronchitis     with colds per patient  . Type 1 diabetes mellitus     Past Surgical History  Procedure Laterality Date  . Left ankle surgery  2001    steel plate and 3 screws   . 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 Breast surgery  1988  . Tonsillectomy and adenoidectomy  1948  . Tubal ligation  1979  . Hammer toe surgery  1998  . Cataract extraction Bilateral 2011    right and then left  . Tympanostomy tube placement Bilateral     Family History  Problem Relation Age of Onset  . Cancer Mother   . Heart Problems Father    Social History:  reports that she has never smoked. She has never used smokeless tobacco. She reports that she drinks about .6 ounces of alcohol per week. She reports that she does not use illicit drugs.  Allergies:  Allergies  Allergen Reactions  . Cefaclor Anaphylaxis  . Tetracycline Anaphylaxis  . Vancomycin Anaphylaxis  . Augmentin [Amoxicillin-Pot Clavulanate] Other (See Comments)    Reaction unknown  . Prednisone     No prescriptions prior to admission    No results found for this or any previous visit (from the past 48 hour(s)). No results found.  Review of Systems  Constitutional: Negative.   Eyes: Negative.   Respiratory: Negative.   Cardiovascular: Negative.   Gastrointestinal: Negative.   Genitourinary: Negative.   Musculoskeletal: Positive for back pain and neck pain.  Skin: Negative.   Neurological: Positive for sensory change and focal weakness.  Endo/Heme/Allergies: Negative.   Psychiatric/Behavioral:  Negative.     There were no vitals taken for this visit. Physical Exam hent, nl. Neck pain with mobility. Cv, nl. Lungs, clear, abdomen, soft. Extremities, grade 1 edema. Shoulder  And akle surgical scars. NEURO weakness of deltoids, biceps and wrist extensors. dtr nl. Sensory tinnel sign positive bilaterally. 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  Assessment/Plan DECOMPRESSION WITH CORPECTOMIES AND FUSION FROM Cc3 TO C7. sshe and her son are aware of risks and benefits  Hillarie Harrigan M 07/30/2014, 1:31 PM

## 2014-07-31 ENCOUNTER — Inpatient Hospital Stay (HOSPITAL_COMMUNITY): Payer: Medicare Other | Admitting: Anesthesiology

## 2014-07-31 ENCOUNTER — Inpatient Hospital Stay (HOSPITAL_COMMUNITY): Payer: Medicare Other

## 2014-07-31 ENCOUNTER — Encounter (HOSPITAL_COMMUNITY): Payer: Medicare Other | Admitting: Anesthesiology

## 2014-07-31 ENCOUNTER — Encounter (HOSPITAL_COMMUNITY)
Admission: RE | Disposition: A | Discharge status: Home Health | Payer: Self-pay | Source: Ambulatory Visit | Attending: Neurosurgery

## 2014-07-31 ENCOUNTER — Inpatient Hospital Stay (HOSPITAL_COMMUNITY)
Admission: RE | Admit: 2014-07-31 | Discharge: 2014-08-05 | DRG: 472 | Disposition: A | Discharge status: Home Health | Payer: Medicare Other | Source: Ambulatory Visit | Attending: Neurosurgery | Admitting: Neurosurgery

## 2014-07-31 ENCOUNTER — Encounter (HOSPITAL_COMMUNITY): Payer: Self-pay | Admitting: *Deleted

## 2014-07-31 DIAGNOSIS — Q762 Congenital spondylolisthesis: Secondary | ICD-10-CM

## 2014-07-31 DIAGNOSIS — J45909 Unspecified asthma, uncomplicated: Secondary | ICD-10-CM | POA: Diagnosis present

## 2014-07-31 DIAGNOSIS — Z888 Allergy status to other drugs, medicaments and biological substances status: Secondary | ICD-10-CM | POA: Diagnosis not present

## 2014-07-31 DIAGNOSIS — E109 Type 1 diabetes mellitus without complications: Secondary | ICD-10-CM | POA: Diagnosis present

## 2014-07-31 DIAGNOSIS — X58XXXA Exposure to other specified factors, initial encounter: Secondary | ICD-10-CM | POA: Diagnosis present

## 2014-07-31 DIAGNOSIS — Z881 Allergy status to other antibiotic agents status: Secondary | ICD-10-CM

## 2014-07-31 DIAGNOSIS — R1310 Dysphagia, unspecified: Secondary | ICD-10-CM | POA: Diagnosis present

## 2014-07-31 DIAGNOSIS — I10 Essential (primary) hypertension: Secondary | ICD-10-CM | POA: Diagnosis present

## 2014-07-31 DIAGNOSIS — Z96619 Presence of unspecified artificial shoulder joint: Secondary | ICD-10-CM

## 2014-07-31 DIAGNOSIS — R112 Nausea with vomiting, unspecified: Secondary | ICD-10-CM | POA: Diagnosis present

## 2014-07-31 DIAGNOSIS — S12400A Unspecified displaced fracture of fifth cervical vertebra, initial encounter for closed fracture: Secondary | ICD-10-CM | POA: Diagnosis present

## 2014-07-31 DIAGNOSIS — M4802 Spinal stenosis, cervical region: Secondary | ICD-10-CM | POA: Diagnosis present

## 2014-07-31 DIAGNOSIS — G988 Other disorders of nervous system: Secondary | ICD-10-CM | POA: Diagnosis present

## 2014-07-31 DIAGNOSIS — Z794 Long term (current) use of insulin: Secondary | ICD-10-CM

## 2014-07-31 DIAGNOSIS — E785 Hyperlipidemia, unspecified: Secondary | ICD-10-CM | POA: Diagnosis present

## 2014-07-31 HISTORY — PX: ANTERIOR CERVICAL CORPECTOMY: SHX1159

## 2014-07-31 LAB — GLUCOSE, CAPILLARY
Glucose-Capillary: 375 mg/dL — ABNORMAL HIGH (ref 70–99)
Glucose-Capillary: 224 mg/dL — ABNORMAL HIGH (ref 70–99)
Glucose-Capillary: 317 mg/dL — ABNORMAL HIGH (ref 70–99)
Glucose-Capillary: 99 mg/dL (ref 70–99)
Glucose-Capillary: 67 mg/dL — ABNORMAL LOW (ref 70–99)
Glucose-Capillary: 173 mg/dL — ABNORMAL HIGH (ref 70–99)
Glucose-Capillary: 215 mg/dL — ABNORMAL HIGH (ref 70–99)
Glucose-Capillary: 316 mg/dL — ABNORMAL HIGH (ref 70–99)
Glucose-Capillary: 247 mg/dL — ABNORMAL HIGH (ref 70–99)
Glucose-Capillary: 143 mg/dL — ABNORMAL HIGH (ref 70–99)

## 2014-07-31 SURGERY — ANTERIOR CERVICAL CORPECTOMY
Anesthesia: General

## 2014-07-31 MED ORDER — CLINDAMYCIN PHOSPHATE 900 MG/50ML IV SOLN
900.0000 mg | Freq: Once | INTRAVENOUS | Status: DC
  Filled 2014-07-31: qty 50

## 2014-07-31 MED ORDER — PROPOFOL 10 MG/ML IV BOLUS
INTRAVENOUS | Status: DC | PRN
  Administered 2014-07-31: 130 mg via INTRAVENOUS

## 2014-07-31 MED ORDER — 0.9 % SODIUM CHLORIDE (POUR BTL) OPTIME
TOPICAL | Status: DC | PRN
  Administered 2014-07-31: 1000 mL

## 2014-07-31 MED ORDER — LIDOCAINE HCL (CARDIAC) 20 MG/ML IV SOLN
INTRAVENOUS | Status: DC | PRN
  Administered 2014-07-31: 100 mg via INTRAVENOUS

## 2014-07-31 MED ORDER — SODIUM CHLORIDE 0.9 % IV SOLN: INTRAVENOUS | Status: DC

## 2014-07-31 MED ORDER — MENTHOL 3 MG MT LOZG
1.0000 | LOZENGE | OROMUCOSAL | Status: DC | PRN
  Administered 2014-07-31 – 2014-08-01 (×3): 3 mg via ORAL
  Filled 2014-07-31 (×2): qty 9

## 2014-07-31 MED ORDER — INSULIN REGULAR BOLUS VIA INFUSION
0.0000 [IU] | Freq: Three times a day (TID) | INTRAVENOUS | Status: DC
  Filled 2014-07-31: qty 10

## 2014-07-31 MED ORDER — INSULIN ASPART 100 UNIT/ML ~~LOC~~ SOLN
0.0000 [IU] | Freq: Three times a day (TID) | SUBCUTANEOUS | Status: DC
  Administered 2014-08-01: 2 [IU] via SUBCUTANEOUS
  Administered 2014-08-01: 15 [IU] via SUBCUTANEOUS
  Administered 2014-08-01: 3 [IU] via SUBCUTANEOUS
  Administered 2014-08-02: 5 [IU] via SUBCUTANEOUS
  Administered 2014-08-02 – 2014-08-03 (×3): 3 [IU] via SUBCUTANEOUS
  Administered 2014-08-03: 2 [IU] via SUBCUTANEOUS
  Administered 2014-08-04: 8 [IU] via SUBCUTANEOUS
  Administered 2014-08-04 (×2): 3 [IU] via SUBCUTANEOUS
  Administered 2014-08-05: 2 [IU] via SUBCUTANEOUS
  Administered 2014-08-05: 8 [IU] via SUBCUTANEOUS
  Administered 2014-08-05: 11 [IU] via SUBCUTANEOUS

## 2014-07-31 MED ORDER — PHENOL 1.4 % MT LIQD
1.0000 | OROMUCOSAL | Status: DC | PRN
  Filled 2014-07-31: qty 177

## 2014-07-31 MED ORDER — MORPHINE SULFATE 2 MG/ML IJ SOLN
1.0000 mg | INTRAMUSCULAR | Status: DC | PRN
  Administered 2014-07-31: 1 mg via INTRAVENOUS
  Filled 2014-07-31: qty 1

## 2014-07-31 MED ORDER — ALBUTEROL SULFATE (2.5 MG/3ML) 0.083% IN NEBU
2.5000 mg | INHALATION_SOLUTION | RESPIRATORY_TRACT | Status: DC | PRN
  Filled 2014-07-31: qty 3

## 2014-07-31 MED ORDER — THROMBIN 5000 UNITS EX SOLR
OROMUCOSAL | Status: DC | PRN
  Administered 2014-07-31 (×2): via TOPICAL

## 2014-07-31 MED ORDER — PHENOL 1.4 % MT LIQD: 1.0000 | OROMUCOSAL | Status: DC | PRN

## 2014-07-31 MED ORDER — HYDROMORPHONE HCL PF 1 MG/ML IJ SOLN
INTRAMUSCULAR | Status: AC
  Filled 2014-07-31: qty 1

## 2014-07-31 MED ORDER — DEXTROSE 50 % IV SOLN
INTRAVENOUS | Status: DC | PRN
  Administered 2014-07-31: .5 via INTRAVENOUS

## 2014-07-31 MED ORDER — FENTANYL CITRATE 0.05 MG/ML IJ SOLN
INTRAMUSCULAR | Status: DC | PRN
  Administered 2014-07-31: 150 ug via INTRAVENOUS
  Administered 2014-07-31 (×2): 50 ug via INTRAVENOUS

## 2014-07-31 MED ORDER — CLINDAMYCIN PHOSPHATE 900 MG/50ML IV SOLN
900.0000 mg | Freq: Once | INTRAVENOUS | Status: DC
  Filled 2014-07-31 (×2): qty 50

## 2014-07-31 MED ORDER — ALBUTEROL SULFATE HFA 108 (90 BASE) MCG/ACT IN AERS: 1.0000 | INHALATION_SPRAY | RESPIRATORY_TRACT | Status: DC | PRN

## 2014-07-31 MED ORDER — LACTATED RINGERS IV SOLN
INTRAVENOUS | Status: DC
  Administered 2014-07-31: 50 mL/h via INTRAVENOUS

## 2014-07-31 MED ORDER — INSULIN ASPART 100 UNIT/ML ~~LOC~~ SOLN
0.0000 [IU] | Freq: Three times a day (TID) | SUBCUTANEOUS | Status: DC
  Administered 2014-07-31: 11 [IU] via SUBCUTANEOUS
  Filled 2014-07-31 (×25): qty 0.15

## 2014-07-31 MED ORDER — DIAZEPAM 5 MG PO TABS: 5.0000 mg | ORAL_TABLET | Freq: Three times a day (TID) | ORAL | Status: DC | PRN

## 2014-07-31 MED ORDER — PHENYLEPHRINE HCL 10 MG/ML IJ SOLN
INTRAMUSCULAR | Status: DC | PRN
  Administered 2014-07-31: 40 ug via INTRAVENOUS
  Administered 2014-07-31: 80 ug via INTRAVENOUS
  Administered 2014-07-31 (×2): 40 ug via INTRAVENOUS
  Administered 2014-07-31: 120 ug via INTRAVENOUS
  Administered 2014-07-31: 80 ug via INTRAVENOUS
  Administered 2014-07-31: 40 ug via INTRAVENOUS

## 2014-07-31 MED ORDER — SODIUM CHLORIDE 0.9 % IV SOLN: 250.0000 mL | INTRAVENOUS | Status: DC

## 2014-07-31 MED ORDER — DIAZEPAM 5 MG PO TABS
5.0000 mg | ORAL_TABLET | Freq: Three times a day (TID) | ORAL | Status: DC | PRN
  Administered 2014-08-01 – 2014-08-05 (×4): 5 mg via ORAL
  Filled 2014-07-31 (×4): qty 1

## 2014-07-31 MED ORDER — OXYCODONE-ACETAMINOPHEN 5-325 MG PO TABS
1.0000 | ORAL_TABLET | ORAL | Status: DC | PRN
  Administered 2014-08-01 (×2): 1 via ORAL
  Administered 2014-08-01 – 2014-08-05 (×18): 2 via ORAL
  Filled 2014-07-31 (×4): qty 2
  Filled 2014-07-31: qty 1
  Filled 2014-07-31 (×3): qty 2
  Filled 2014-07-31 (×2): qty 1
  Filled 2014-07-31 (×11): qty 2

## 2014-07-31 MED ORDER — ONDANSETRON HCL 4 MG/2ML IJ SOLN
4.0000 mg | INTRAMUSCULAR | Status: DC | PRN
  Administered 2014-07-31: 4 mg via INTRAVENOUS
  Filled 2014-07-31: qty 2

## 2014-07-31 MED ORDER — OFLOXACIN 0.3 % OP SOLN
1.0000 [drp] | Freq: Four times a day (QID) | OPHTHALMIC | Status: DC
  Filled 2014-07-31 (×2): qty 5

## 2014-07-31 MED ORDER — SURGIFOAM 100 EX MISC
CUTANEOUS | Status: DC | PRN
  Administered 2014-07-31: 12:00:00 via TOPICAL

## 2014-07-31 MED ORDER — SODIUM CHLORIDE 0.9 % IJ SOLN: 3.0000 mL | INTRAMUSCULAR | Status: DC | PRN

## 2014-07-31 MED ORDER — LACTATED RINGERS IV SOLN
INTRAVENOUS | Status: DC | PRN
  Administered 2014-07-31 (×2): via INTRAVENOUS

## 2014-07-31 MED ORDER — SODIUM CHLORIDE 0.9 % IV SOLN
250.0000 [IU] | INTRAVENOUS | Status: DC | PRN
  Administered 2014-07-31: 5 [IU]/h via INTRAVENOUS

## 2014-07-31 MED ORDER — MENTHOL 3 MG MT LOZG
1.0000 | LOZENGE | OROMUCOSAL | Status: DC | PRN
  Filled 2014-07-31: qty 9

## 2014-07-31 MED ORDER — ACETAMINOPHEN 650 MG RE SUPP: 650.0000 mg | RECTAL | Status: DC | PRN

## 2014-07-31 MED ORDER — ACETAMINOPHEN 325 MG PO TABS: 650.0000 mg | ORAL_TABLET | Freq: Four times a day (QID) | ORAL | Status: DC | PRN

## 2014-07-31 MED ORDER — ALBUTEROL SULFATE (2.5 MG/3ML) 0.083% IN NEBU: 2.5000 mg | INHALATION_SOLUTION | RESPIRATORY_TRACT | Status: DC | PRN

## 2014-07-31 MED ORDER — OXYCODONE HCL 5 MG PO TABS: 5.0000 mg | ORAL_TABLET | Freq: Once | ORAL | Status: DC | PRN

## 2014-07-31 MED ORDER — ROCURONIUM BROMIDE 100 MG/10ML IV SOLN
INTRAVENOUS | Status: DC | PRN
  Administered 2014-07-31: 40 mg via INTRAVENOUS

## 2014-07-31 MED ORDER — NEOSTIGMINE METHYLSULFATE 10 MG/10ML IV SOLN
INTRAVENOUS | Status: DC | PRN
  Administered 2014-07-31: 3 mg via INTRAVENOUS

## 2014-07-31 MED ORDER — DEXTROSE-NACL 5-0.45 % IV SOLN: INTRAVENOUS | Status: DC

## 2014-07-31 MED ORDER — ACETAMINOPHEN 325 MG PO TABS: 650.0000 mg | ORAL_TABLET | ORAL | Status: DC | PRN

## 2014-07-31 MED ORDER — DEXTROSE 50 % IV SOLN
25.0000 mL | INTRAVENOUS | Status: DC | PRN
  Filled 2014-07-31: qty 50

## 2014-07-31 MED ORDER — MORPHINE SULFATE 2 MG/ML IJ SOLN
1.0000 mg | INTRAMUSCULAR | Status: DC | PRN
  Administered 2014-08-01 – 2014-08-05 (×10): 2 mg via INTRAVENOUS
  Filled 2014-07-31 (×10): qty 1

## 2014-07-31 MED ORDER — SIMVASTATIN 40 MG PO TABS
40.0000 mg | ORAL_TABLET | Freq: Every day | ORAL | Status: DC
  Administered 2014-08-01 – 2014-08-05 (×5): 40 mg via ORAL
  Filled 2014-07-31 (×6): qty 1

## 2014-07-31 MED ORDER — INSULIN ASPART 100 UNIT/ML ~~LOC~~ SOLN
0.0000 [IU] | Freq: Every day | SUBCUTANEOUS | Status: DC
  Filled 2014-07-31 (×9): qty 0.05

## 2014-07-31 MED ORDER — EPHEDRINE SULFATE 50 MG/ML IJ SOLN
INTRAMUSCULAR | Status: DC | PRN
  Administered 2014-07-31 (×2): 5 mg via INTRAVENOUS
  Administered 2014-07-31: 10 mg via INTRAVENOUS

## 2014-07-31 MED ORDER — FENTANYL CITRATE 0.05 MG/ML IJ SOLN
INTRAMUSCULAR | Status: AC
  Filled 2014-07-31: qty 5

## 2014-07-31 MED ORDER — SODIUM CHLORIDE 0.9 % IV SOLN
INTRAVENOUS | Status: DC
  Administered 2014-07-31: 5 [IU]/h via INTRAVENOUS
  Filled 2014-07-31: qty 2.5

## 2014-07-31 MED ORDER — SODIUM CHLORIDE 0.9 % IV SOLN
INTRAVENOUS | Status: DC
  Administered 2014-07-31 – 2014-08-01 (×2): via INTRAVENOUS
  Administered 2014-08-02: 50 mL via INTRAVENOUS
  Administered 2014-08-03 – 2014-08-05 (×3): via INTRAVENOUS

## 2014-07-31 MED ORDER — PROMETHAZINE HCL 25 MG/ML IJ SOLN
INTRAMUSCULAR | Status: AC
  Filled 2014-07-31: qty 1

## 2014-07-31 MED ORDER — ONDANSETRON HCL 4 MG/2ML IJ SOLN
4.0000 mg | INTRAMUSCULAR | Status: DC | PRN
  Administered 2014-08-02 – 2014-08-03 (×3): 4 mg via INTRAVENOUS
  Filled 2014-07-31 (×3): qty 2

## 2014-07-31 MED ORDER — ONDANSETRON HCL 4 MG/2ML IJ SOLN
INTRAMUSCULAR | Status: DC | PRN
  Administered 2014-07-31: 4 mg via INTRAVENOUS

## 2014-07-31 MED ORDER — GLYCOPYRROLATE 0.2 MG/ML IJ SOLN
INTRAMUSCULAR | Status: DC | PRN
  Administered 2014-07-31: 0.4 mg via INTRAVENOUS

## 2014-07-31 MED ORDER — SODIUM CHLORIDE 0.9 % IJ SOLN: 3.0000 mL | Freq: Two times a day (BID) | INTRAMUSCULAR | Status: DC

## 2014-07-31 MED ORDER — SIMVASTATIN 40 MG PO TABS
40.0000 mg | ORAL_TABLET | Freq: Every evening | ORAL | Status: DC
  Filled 2014-07-31: qty 1

## 2014-07-31 MED ORDER — OXYCODONE-ACETAMINOPHEN 5-325 MG PO TABS: 1.0000 | ORAL_TABLET | ORAL | Status: DC | PRN

## 2014-07-31 MED ORDER — OXYCODONE HCL 5 MG/5ML PO SOLN: 5.0000 mg | Freq: Once | ORAL | Status: DC | PRN

## 2014-07-31 MED ORDER — DEXTROSE 50 % IV SOLN
INTRAVENOUS | Status: AC
  Filled 2014-07-31: qty 50

## 2014-07-31 MED ORDER — INSULIN ASPART 100 UNIT/ML ~~LOC~~ SOLN
0.0000 [IU] | Freq: Every day | SUBCUTANEOUS | Status: DC
  Administered 2014-08-02: 3 [IU] via SUBCUTANEOUS

## 2014-07-31 MED ORDER — DEXTROSE 50 % IV SOLN: 25.0000 mL | INTRAVENOUS | Status: DC | PRN

## 2014-07-31 MED ORDER — PROMETHAZINE HCL 25 MG/ML IJ SOLN
6.2500 mg | INTRAMUSCULAR | Status: DC | PRN
  Administered 2014-07-31: 6.25 mg via INTRAVENOUS

## 2014-07-31 MED ORDER — HYDROMORPHONE HCL PF 1 MG/ML IJ SOLN
0.2500 mg | INTRAMUSCULAR | Status: DC | PRN
  Administered 2014-07-31 (×2): 0.5 mg via INTRAVENOUS

## 2014-07-31 SURGICAL SUPPLY — 56 items
APL SKNCLS STERI-STRIP NONHPOA (GAUZE/BANDAGES/DRESSINGS) ×1
BENZOIN TINCTURE PRP APPL 2/3 (GAUZE/BANDAGES/DRESSINGS) ×2 IMPLANT
BIT DRILL SM SPINE QC 12 (BIT) ×1 IMPLANT
BLADE ULTRA TIP 2M (BLADE) ×2 IMPLANT
BNDG GAUZE ELAST 4 BULKY (GAUZE/BANDAGES/DRESSINGS) ×4 IMPLANT
BUR BARREL STRAIGHT FLUTE 4.0 (BURR) ×1 IMPLANT
BUR MATCHSTICK NEURO 3.0 LAGG (BURR) ×3 IMPLANT
CANISTER SUCT 3000ML (MISCELLANEOUS) ×2 IMPLANT
CONT SPEC 4OZ CLIKSEAL STRL BL (MISCELLANEOUS) ×2 IMPLANT
COVER MAYO STAND STRL (DRAPES) ×2 IMPLANT
DRAIN SNY 10X20 3/4 PERF (WOUND CARE) ×1 IMPLANT
DRAPE C-ARM 42X72 X-RAY (DRAPES) ×4 IMPLANT
DRAPE LAPAROTOMY 100X72 PEDS (DRAPES) ×2 IMPLANT
DRAPE MICROSCOPE LEICA (MISCELLANEOUS) ×2 IMPLANT
DRAPE POUCH INSTRU U-SHP 10X18 (DRAPES) ×2 IMPLANT
DRAPE PROXIMA HALF (DRAPES) IMPLANT
DURAPREP 6ML APPLICATOR 50/CS (WOUND CARE) ×2 IMPLANT
ELECT REM PT RETURN 9FT ADLT (ELECTROSURGICAL) ×2
ELECTRODE REM PT RTRN 9FT ADLT (ELECTROSURGICAL) ×1 IMPLANT
EVACUATOR SILICONE 100CC (DRAIN) ×1 IMPLANT
GAUZE SPONGE 4X4 12PLY STRL (GAUZE/BANDAGES/DRESSINGS) ×2 IMPLANT
GAUZE SPONGE 4X4 16PLY XRAY LF (GAUZE/BANDAGES/DRESSINGS) IMPLANT
GLOVE BIOGEL M 8.0 STRL (GLOVE) ×2 IMPLANT
GLOVE EXAM NITRILE LRG STRL (GLOVE) IMPLANT
GLOVE EXAM NITRILE MD LF STRL (GLOVE) IMPLANT
GLOVE EXAM NITRILE XL STR (GLOVE) IMPLANT
GLOVE EXAM NITRILE XS STR PU (GLOVE) IMPLANT
GOWN STRL REUS W/ TWL LRG LVL3 (GOWN DISPOSABLE) ×1 IMPLANT
GOWN STRL REUS W/ TWL XL LVL3 (GOWN DISPOSABLE) IMPLANT
GOWN STRL REUS W/TWL 2XL LVL3 (GOWN DISPOSABLE) IMPLANT
GOWN STRL REUS W/TWL LRG LVL3 (GOWN DISPOSABLE) ×2
GOWN STRL REUS W/TWL XL LVL3 (GOWN DISPOSABLE)
HEMOSTAT POWDER SURGIFOAM 1G (HEMOSTASIS) ×1 IMPLANT
KIT BASIN OR (CUSTOM PROCEDURE TRAY) ×3 IMPLANT
KIT ROOM TURNOVER OR (KITS) ×2 IMPLANT
NDL SPNL 22GX3.5 QUINCKE BK (NEEDLE) ×1 IMPLANT
NEEDLE SPNL 22GX3.5 QUINCKE BK (NEEDLE) ×2 IMPLANT
NS IRRIG 1000ML POUR BTL (IV SOLUTION) ×2 IMPLANT
PACK LAMINECTOMY NEURO (CUSTOM PROCEDURE TRAY) ×2 IMPLANT
PATTIES SURGICAL .5 X1 (DISPOSABLE) ×2 IMPLANT
PLATE ANT CERV XTEND 3 LV 45 (Plate) ×1 IMPLANT
PUTTY DBX 1CC (Putty) ×2 IMPLANT
PUTTY DBX 1CC DEPUY (Putty) IMPLANT
RUBBERBAND STERILE (MISCELLANEOUS) ×4 IMPLANT
SCREW XTD VAR 4.2 SELF TAP 12 (Screw) ×4 IMPLANT
SPONGE INTESTINAL PEANUT (DISPOSABLE) ×4 IMPLANT
SPONGE SURGIFOAM ABS GEL 100 (HEMOSTASIS) ×2 IMPLANT
STRIP CLOSURE SKIN 1/2X4 (GAUZE/BANDAGES/DRESSINGS) ×2 IMPLANT
SUT VIC AB 3-0 SH 8-18 (SUTURE) ×3 IMPLANT
SYR 20ML ECCENTRIC (SYRINGE) ×2 IMPLANT
TAPE CLOTH SURG 4X10 WHT LF (GAUZE/BANDAGES/DRESSINGS) ×1 IMPLANT
TISSUE SHAFT FIBULA 14 18MM (Bone Implant) ×1 IMPLANT
TOWEL OR 17X24 6PK STRL BLUE (TOWEL DISPOSABLE) ×2 IMPLANT
TOWEL OR 17X26 10 PK STRL BLUE (TOWEL DISPOSABLE) ×2 IMPLANT
TRAY FOLEY CATH 14FRSI W/METER (CATHETERS) ×1 IMPLANT
WATER STERILE IRR 1000ML POUR (IV SOLUTION) ×2 IMPLANT

## 2014-07-31 NOTE — Anesthesia Preprocedure Evaluation (Signed)
Anesthesia Evaluation  Patient identified by MRN, date of birth, ID band Patient awake    Reviewed: Allergy & Precautions, H&P , NPO status , Patient's Chart, lab work & pertinent test results  History of Anesthesia Complications Negative for: history of anesthetic complications  Airway Mallampati: II  Neck ROM: Full    Dental  (+) Teeth Intact   Pulmonary asthma ,  breath sounds clear to auscultation        Cardiovascular hypertension, Rhythm:Regular Rate:Normal     Neuro/Psych    GI/Hepatic   Endo/Other  diabetes, Poorly Controlled, Insulin Dependent  Renal/GU      Musculoskeletal   Abdominal   Peds  Hematology   Anesthesia Other Findings   Reproductive/Obstetrics                           Anesthesia Physical Anesthesia Plan  ASA: III  Anesthesia Plan: General   Post-op Pain Management:    Induction: Intravenous  Airway Management Planned: Oral ETT  Additional Equipment:   Intra-op Plan:   Post-operative Plan: Extubation in OR  Informed Consent: I have reviewed the patients History and Physical, chart, labs and discussed the procedure including the risks, benefits and alternatives for the proposed anesthesia with the patient or authorized representative who has indicated his/her understanding and acceptance.   Dental advisory given  Plan Discussed with: CRNA and Surgeon  Anesthesia Plan Comments:         Anesthesia Quick Evaluation

## 2014-07-31 NOTE — Transfer of Care (Signed)
Immediate Anesthesia Transfer of Care Note  Patient: Danielle Clarke  Procedure(s) Performed: Procedure(s) with comments: Cervical Four to Cervical Six Corpectomy (N/A) - C4 to C6 Corpectomy  Patient Location: PACU  Anesthesia Type:General  Level of Consciousness: awake, alert  and oriented  Airway & Oxygen Therapy: Patient connected to face mask oxygen  Post-op Assessment: Report given to PACU RN  Post vital signs: stable  Complications: No apparent anesthesia complications

## 2014-07-31 NOTE — Anesthesia Postprocedure Evaluation (Signed)
  Anesthesia Post-op Note  Patient: Danielle Clarke  Procedure(s) Performed: Procedure(s) with comments: Cervical Four to Cervical Six Corpectomy (N/A) - C4 to C6 Corpectomy  Patient Location: PACU  Anesthesia Type:General  Level of Consciousness: awake and alert   Airway and Oxygen Therapy: Patient Spontanous Breathing  Post-op Pain: mild  Post-op Assessment: Post-op Vital signs reviewed  Post-op Vital Signs: stable  Last Vitals:  Filed Vitals:   07/31/14 1530  BP: 137/42  Pulse: 86  Temp:   Resp: 16    Complications: No apparent anesthesia complications

## 2014-08-01 LAB — GLUCOSE, CAPILLARY
Glucose-Capillary: 297 mg/dL — ABNORMAL HIGH (ref 70–99)
Glucose-Capillary: 359 mg/dL — ABNORMAL HIGH (ref 70–99)
Glucose-Capillary: 187 mg/dL — ABNORMAL HIGH (ref 70–99)
Glucose-Capillary: 126 mg/dL — ABNORMAL HIGH (ref 70–99)
Glucose-Capillary: 125 mg/dL — ABNORMAL HIGH (ref 70–99)

## 2014-08-01 MED ORDER — INSULIN GLARGINE 100 UNIT/ML ~~LOC~~ SOLN
12.0000 [IU] | Freq: Every day | SUBCUTANEOUS | Status: DC
  Administered 2014-08-01 – 2014-08-05 (×5): 12 [IU] via SUBCUTANEOUS
  Filled 2014-08-01 (×5): qty 0.12

## 2014-08-01 NOTE — Evaluation (Signed)
Physical Therapy Evaluation Patient Details Name: Danielle Clarke MRN: [id number] DOB: 1941/06/29 Today's Date: 08/01/2014   History of Present Illness  patient complaining of neck pain with radiation to both upper extremities associated with numbness, weakness and poor balance. Had a cervical fracture about 2 years ago pt s/p corpectomy C4-6  Clinical Impression  Pt limited by pain and spasm today but benefits from encouragement, reassurance and soft tissue massage. Pt and dgtr educated for precautions with mobility, dressing, transfers and cervical collar with handout provided. Pt was independent living alone but will have 24hr assist of family. Given pt weakness in bil UE and bil LE at this point along with limited mobility would recommend CIR. Will follow to maximize function, strength, gait and adherence to precautions with all mobility.     Follow Up Recommendations Supervision/Assistance - 24 hour;CIR (pending pt ability to tolerate increased therapy)    Equipment Recommendations  None recommended by PT    Recommendations for Other Services OT consult     Precautions / Restrictions Precautions Precautions: Fall;Cervical Required Braces or Orthoses: Cervical Brace Cervical Brace: At all times;Hard collar      Mobility  Bed Mobility Overal bed mobility: Needs Assistance Bed Mobility: Rolling;Sidelying to Sit Rolling: Min assist Sidelying to sit: Mod assist       General bed mobility comments: cues for sequence and technique with assist to complete transfers  Transfers Overall transfer level: Needs assistance   Transfers: Sit to/from Stand Sit to Stand: Mod assist         General transfer comment: cues for hand placement with assist to elevate trunk and rise from surface x 2 trials  Ambulation/Gait Ambulation/Gait assistance: Min assist Ambulation Distance (Feet): 80 Feet Assistive device: Rolling walker (2 wheeled) Gait Pattern/deviations: Shuffle;Narrow  base of support   Gait velocity interpretation: Below normal speed for age/gender General Gait Details: cues for posture as pt with tendency to lean forward, assist to direct and steer RW and cues for increased stride length  Stairs            Wheelchair Mobility    Modified Rankin (Stroke Patients Only)       Balance Overall balance assessment: Needs assistance   Sitting balance-Leahy Scale: Fair       Standing balance-Leahy Scale: Poor                               Pertinent Vitals/Pain Pain Assessment: 0-10 Pain Score: 8  Pain Location: right shoulder and neck Pain Descriptors / Indicators: Aching;Cramping Pain Intervention(s): Repositioned;Premedicated before session    Home Living Family/patient expects to be discharged to:: Private residence Living Arrangements: Alone Available Help at Discharge: Family;Available 24 hours/day Type of Home: House       Home Layout: One level Home Equipment: Walker - 2 wheels;Cane - single point;Shower seat      Prior Function Level of Independence: Independent               Hand Dominance        Extremity/Trunk Assessment   Upper Extremity Assessment: Generalized weakness;Defer to OT evaluation           Lower Extremity Assessment: Generalized weakness      Cervical / Trunk Assessment: Kyphotic;Other exceptions  Communication   Communication: No difficulties  Cognition Arousal/Alertness: Awake/alert Behavior During Therapy: WFL for tasks assessed/performed Overall Cognitive Status: Within Functional Limits for tasks assessed  General Comments      Exercises        Assessment/Plan    PT Assessment Patient needs continued PT services  PT Diagnosis Abnormality of gait;Acute pain   PT Problem List Decreased strength;Decreased activity tolerance;Decreased balance;Decreased mobility;Decreased knowledge of precautions;Pain;Decreased safety  awareness;Decreased knowledge of use of DME;Decreased coordination  PT Treatment Interventions DME instruction;Gait training;Functional mobility training;Therapeutic activities;Therapeutic exercise;Patient/family education;Balance training   PT Goals (Current goals can be found in the Care Plan section) Acute Rehab PT Goals Patient Stated Goal: return to walking at [employer] PT Goal Formulation: With patient/family Time For Goal Achievement: 08/15/14 Potential to Achieve Goals: Good    Frequency Min 5X/week   Barriers to discharge        Co-evaluation               End of Session Equipment Utilized During Treatment: Cervical collar;Gait belt Activity Tolerance: Patient limited by pain Patient left: in chair;with call bell/phone within reach;with family/visitor present Nurse Communication: Mobility status;Precautions         Time: 4098-1191 PT Time Calculation (min): 26 min   Charges:   PT Evaluation $Initial PT Evaluation Tier I: 1 Procedure PT Treatments $Therapeutic Activity: 8-22 mins   PT G CodesDelorse Lek 08/01/2014, 12:15 PM Delaney Meigs, PT [phone]

## 2014-08-01 NOTE — Progress Notes (Signed)
Subjective: Patient reports She's complaining of dysphagia but improved sensation in her hands no breathing difficulty  Objective: Vital signs in last 24 hours: Temp:  [97.5 F (36.4 C)-99.8 F (37.7 C)] 99.7 F (37.6 C) (09/05 0800) Pulse Rate:  [85-117] 100 (09/05 0800) Resp:  [11-25] 13 (09/05 0800) BP: (128-170)/(31-74) 134/43 mmHg (09/05 0800) SpO2:  [91 %-100 %] 100 % (09/05 0800)  Intake/Output from previous day: 09/04 0701 - 09/05 0700 In: 2125 [I.V.:2125] Out: 2093 [Urine:1840; Drains:53; Blood:200] Intake/Output this shift: Total I/O In: 75 [I.V.:75] Out: -   Myelopathy 44+ strength in her hands 4+ strength in her lower extremities incision clean dry and intact flat  Lab Results: No results found for this basename: WBC, HGB, HCT, PLT,  in the last 72 hours BMET No results found for this basename: NA, K, CL, CO2, GLUCOSE, BUN, CREATININE, CALCIUM,  in the last 72 hours  Studies/Results: Dg Cervical Spine 1 View  07/31/2014   CLINICAL DATA:  Cervical fusion and corpectomy.  EXAM: DG CERVICAL SPINE - 1 VIEW  COMPARISON:  07/13/2014  FINDINGS: Cross-table lateral view was obtained intraoperatively. Needles have been advanced anteriorly into the C3-4 and C5-6 disc space levels.  IMPRESSION: Localization at C3-4 and C5-6 intraoperatively.   Electronically Signed   By: Irish Lack M.D.   On: 07/31/2014 14:17   Dg Cervical Spine 2-3 Views  07/31/2014   CLINICAL DATA:  Cervical corpectomy and fusion.  EXAM: DG C-ARM 61-120 MIN; CERVICAL SPINE - 2-3 VIEW  COMPARISON:  07/13/2014  FINDINGS: Intraoperative images show anterior cervical fusion plate extending from C3 to C7. Alignment appears near anatomic.  IMPRESSION: Near anatomic alignment following corpectomy and fusion with fusion plate spanning from C3 to C7.   Electronically Signed   By: Irish Lack M.D.   On: 07/31/2014 14:13   Dg C-arm 1-60 Min  07/31/2014   CLINICAL DATA:  Cervical corpectomy and fusion.  EXAM: DG  C-ARM 61-120 MIN; CERVICAL SPINE - 2-3 VIEW  COMPARISON:  07/13/2014  FINDINGS: Intraoperative images show anterior cervical fusion plate extending from C3 to C7. Alignment appears near anatomic.  IMPRESSION: Near anatomic alignment following corpectomy and fusion with fusion plate spanning from C3 to C7.   Electronically Signed   By: Irish Lack M.D.   On: 07/31/2014 14:13    Assessment/Plan: Mobilize with physical and occupational therapy current glucoses are still over 300 will contact diabetic nurse will have to hold off on Decadron keep her on IV fluids and allow the dysphagia more time to settle down the  LOS: 1 day     Deshannon Seide P 08/01/2014, 9:01 AM

## 2014-08-01 NOTE — Progress Notes (Signed)
Inpatient Diabetes Program Recommendations  AACE/ADA: New Consensus Statement on Inpatient Glycemic Control (2013)  Target Ranges:  Prepandial:   less than 140 mg/dL      Peak postprandial:   less than 180 mg/dL (1-2 hours)      Critically ill patients:  140 - 180 mg/dL   Reason for Assessment: MD request to assess diabetes medications Diabetes history: Type 1 since 14 yoa Outpatient Diabetes medications: Lantus 12 units daily, Novolog correction with base of 2 units with meals plus correction Current orders for Inpatient glycemic control: Novolog Moderate correction tid with meals and Novolog HS scale  Note:  Diabetes Coordinator consult received remotely.  No phone in patient's room.  Patient's nurse questioned patient regarding usual Lantus does.  Reportedly takes Lantus 12 units daily (not 14 units as listed in Med Rec).  Since patient has type 1 diabetes, needs basal insulin resumed as soon as possible to avoid metabolic complications. Recommend the following:  Lantus 12 units as soon as possible today, then daily    Customized Novolog correction tid with meals based on home scale as follows:   CBG <80 mg/dl = 1 unit   CBG 80 - 161 = 2 units      CBG 151 - 200 = 3 units   CBG 201 - 250 = 4 units   CBG 251 - 300 = 5 units   CBG 301 - 350 = 6 units   CBG 351 - 400 = 7 units   CBG 401 - 450 = 8 units  Continue HS correction scale as currently ordered Have communicated these recommendations with patient's nurse, Gavin Pound.  Thank you.  Audrielle Vankuren S. Elsie Lincoln, RN, CNS, CDE Inpatient Diabetes Program, team pager [phone]

## 2014-08-01 NOTE — Op Note (Signed)
Danielle Clarke, Danielle Clarke NO.:  [id number]  MEDICAL RECORD NO.:  [id number]  LOCATION:  3M05C                        FACILITY:  [facility]  PHYSICIAN:  Hilda Lias, M.D.   DATE OF BIRTH:  [birth year]  DATE OF PROCEDURE:  07/31/2014 DATE OF DISCHARGE:                              OPERATIVE REPORT   PREOPERATIVE DIAGNOSES:  Cervical stenosis with a fracture of cervical 5- 6.  Spondylolisthesis.  Chronic radiculopathy.  Gliosis.  POSTOPERATIVE DIAGNOSES:  Cervical stenosis with a fracture of cervical 5-6.  Spondylolisthesis.  Chronic radiculopathy.  Gliosis.  PROCEDURE:  Anterior cervical 4, 5, 6 corpectomy.  Decompression of the spinal cord, foraminotomy to decompress the C4, 5, 6, and 7 nerve root bilaterally.  Interbody fusion with fibular plate from Z6-X0. Microscope.  SURGEON:  Hilda Lias, M.D.  ASSISTANT:  Donalee Citrin, M.D.  CLINICAL HISTORY:  [name] is a lady who about 2 years ago had a fracture of the cervical spine.  She was seen in my office a few weeks ago complaining of numbness in both of her extremities associated with weakness.  The patient had spondylolisthesis between cervical 3-4 with fracture and retropulsion of the bone at the level of 5-6.  Surgery was advised.  The patient knew the risks and benefits of surgery.  DESCRIPTION OF PROCEDURE:  The patient was taken to the OR and after intubation, the neck was cleaned with DuraPrep.  Drapes were applied. Longitudinal incision was made through the skin, subcutaneous tissue, platysma, all the way down to cervical spine.  X-rays showed that the operative needle was at the level of 3-4 and the lower one was at the level of 5-6.  Then using the microscope and the drill, we started doing corpectomy starting with C4 followed by 5 and 6.  Indeed, the bone between 5-6 was compromising the spinal cord.  Corpectomy was done.  At that point, we were able to decompress the foramen to free the C4, 5-6, and  7 nerve roots bilaterally.  Then using microdissection, we removed the anterior ligament with further decompression of the cord.  Then allograft, fibula was introduced from C3 down to C7.  The length of the graft was proximal to distal 37 mm.  Then placing 2 screws at the level of 3 and 2 screws at the level of 7 were used to keep the plate and the graft in place.  The fibula had autograft and __________ DBX in site.  Hemostasis was accomplished.  A drain was left and the wound was closed with Vicryl and Steri-Strips.  Because of changes in her glucose before surgery, we are going to keep her overnight in the ICU for control.  The patient and the family knew that there is a possibility that she might require posterior decompression.          ______________________________ Hilda Lias, M.D.     EB/MEDQ  D:  07/31/2014  T:  07/31/2014  Job:  [id number]

## 2014-08-02 LAB — GLUCOSE, CAPILLARY
Glucose-Capillary: 94 mg/dL (ref 70–99)
Glucose-Capillary: 225 mg/dL — ABNORMAL HIGH (ref 70–99)
Glucose-Capillary: 188 mg/dL — ABNORMAL HIGH (ref 70–99)
Glucose-Capillary: 273 mg/dL — ABNORMAL HIGH (ref 70–99)

## 2014-08-02 MED ORDER — WHITE PETROLATUM GEL
Status: AC
  Administered 2014-08-02: 06:00:00
  Filled 2014-08-02: qty 5

## 2014-08-02 NOTE — Progress Notes (Signed)
Subjective: Patient reports Doing much better significant improvement and dysphagia  Objective: Vital signs in last 24 hours: Temp:  [97.9 F (36.6 C)-99.7 F (37.6 C)] 97.9 F (36.6 C) (09/06 0329) Pulse Rate:  [77-111] 103 (09/06 0645) Resp:  [10-26] 19 (09/06 0645) BP: (110-163)/(36-54) 132/41 mmHg (09/06 0645) SpO2:  [88 %-100 %] 94 % (09/06 0645)  Intake/Output from previous day: 09/05 0701 - 09/06 0700 In: 1625 [Clarke.O.:600; I.V.:1025] Out: 2115 [Urine:2100; Drains:15] Intake/Output this shift:    Neurologically stable incision clean dry and intact minimal J-Clarke drain output  Lab Results: No results found for this basename: WBC, HGB, HCT, PLT,  in the last 72 hours BMET No results found for this basename: NA, K, CL, CO2, GLUCOSE, BUN, CREATININE, CALCIUM,  in the last 72 hours  Studies/Results: Dg Cervical Spine 1 View  07/31/2014   CLINICAL DATA:  Cervical fusion and corpectomy.  EXAM: DG CERVICAL SPINE - 1 VIEW  COMPARISON:  07/13/2014  FINDINGS: Cross-table lateral view was obtained intraoperatively. Needles have been advanced anteriorly into the C3-4 and C5-6 disc space levels.  IMPRESSION: Localization at C3-4 and C5-6 intraoperatively.   Electronically Signed   By: Irish Lack M.D.   On: 07/31/2014 14:17   Dg Cervical Spine 2-3 Views  07/31/2014   CLINICAL DATA:  Cervical corpectomy and fusion.  EXAM: DG C-ARM 61-120 MIN; CERVICAL SPINE - 2-3 VIEW  COMPARISON:  07/13/2014  FINDINGS: Intraoperative images show anterior cervical fusion plate extending from C3 to C7. Alignment appears near anatomic.  IMPRESSION: Near anatomic alignment following corpectomy and fusion with fusion plate spanning from C3 to C7.   Electronically Signed   By: Irish Lack M.D.   On: 07/31/2014 14:13   Dg C-arm 1-60 Min  07/31/2014   CLINICAL DATA:  Cervical corpectomy and fusion.  EXAM: DG C-ARM 61-120 MIN; CERVICAL SPINE - 2-3 VIEW  COMPARISON:  07/13/2014  FINDINGS: Intraoperative images show  anterior cervical fusion plate extending from C3 to C7. Alignment appears near anatomic.  IMPRESSION: Near anatomic alignment following corpectomy and fusion with fusion plate spanning from C3 to C7.   Electronically Signed   By: Irish Lack M.D.   On: 07/31/2014 14:13    Assessment/Plan: DC JP drain will transfer to the floor PT OT  LOS: 2 days     Danielle Clarke 08/02/2014, 7:33 AM

## 2014-08-02 NOTE — Progress Notes (Addendum)
Patient arrived via wheelchair with RN and Tech.  Patient alert and oriented and assisted x 2 to the bed.  Patient vitals where taken and pain assessed. Patient given incentive spirometer and provided education with teachback.  She indicated that she had not used one while in ICU.   Will continue to monitor. Lance Bosch, RN

## 2014-08-02 NOTE — Progress Notes (Signed)
Physical Therapy Treatment Patient Details Name: Danielle Clarke MRN: [id number] DOB: 1941/10/07 Today's Date: 08/02/2014    History of Present Illness patient complaining of neck pain with radiation to both upper extremities associated with numbness, weakness and poor balance. Had a cervical fracture about 2 years ago pt s/p corpectomy C4-6    PT Comments    Pt moving with increased ease today and continued pain but also better than eval. Pt with education for precautions, collar wear and transfers as pt unable to recall from yesterday. Pt encouraged to increase mobility with nursing and perform HEP. Will follow.   Follow Up Recommendations  Supervision/Assistance - 24 hour;CIR     Equipment Recommendations       Recommendations for Other Services       Precautions / Restrictions Precautions Precautions: Fall;Cervical Required Braces or Orthoses: Cervical Brace Cervical Brace: At all times;Hard collar    Mobility  Bed Mobility Overal bed mobility: Needs Assistance Bed Mobility: Rolling;Sidelying to Sit Rolling: Min assist Sidelying to sit: Min assist       General bed mobility comments: cues for sequence and technique with assist to complete transfers and elevate trunk  Transfers     Transfers: Sit to/from Stand Sit to Stand: Min assist         General transfer comment: cues for hand placement with assist to elevate trunk and rise from surface   Ambulation/Gait Ambulation/Gait assistance: Min assist Ambulation Distance (Feet): 160 Feet Assistive device: Rolling walker (2 wheeled) Gait Pattern/deviations: Step-through pattern;Decreased stride length;Narrow base of support   Gait velocity interpretation: Below normal speed for age/gender General Gait Details: cues for posture as pt with tendency to lean forward, assist to direct and steer RW and cues for increased stride length   Stairs            Wheelchair Mobility    Modified Rankin (Stroke  Patients Only)       Balance                                    Cognition Arousal/Alertness: Awake/alert Behavior During Therapy: WFL for tasks assessed/performed Overall Cognitive Status: Within Functional Limits for tasks assessed                      Exercises General Exercises - Lower Extremity Long Arc Quad: AROM;Seated;Both;15 reps Hip Flexion/Marching: AROM;Seated;Both;15 reps    General Comments        Pertinent Vitals/Pain Pain Score: 7  Pain Location: right shoulder and neck Pain Descriptors / Indicators: Aching Pain Intervention(s): RN gave pain meds during session;Repositioned HR 103 sats 97% RA    Home Living                      Prior Function            PT Goals (current goals can now be found in the care plan section) Progress towards PT goals: Progressing toward goals    Frequency       PT Plan Current plan remains appropriate    Co-evaluation             End of Session Equipment Utilized During Treatment: Cervical collar;Gait belt Activity Tolerance: Patient tolerated treatment well Patient left: in chair;with call bell/phone within reach;with nursing/sitter in room     Time: 4098-1191 PT Time Calculation (min): 26 min  Charges:  $Gait  Training: 8-22 mins $Therapeutic Activity: 8-22 mins                    G Codes:      Delorse Lek 2014-08-22, 9:18 AM Delaney Meigs, PT [phone]

## 2014-08-02 NOTE — Progress Notes (Signed)
Occupational Therapy Evaluation Patient Details Name: Danielle Clarke MRN: [id number] DOB: [birth year] Today's Date: 08/02/2014    History of Present Illness patient complaining of neck pain with radiation to both upper extremities associated with numbness, weakness and poor balance. Had a cervical fracture about 2 years ago pt s/p corpectomy C4-6   Clinical Impression   PTA pt lived home alone and was independent with ADLs and functional mobility. Pt reports she will stay with her son or daughter after discharge and will have 24/7 supervision. OT arrived as pt was settling into bed after using BSC and pt declined OOB activities. Pt requires assistance for donning/doffing c-collar and education on incorporating cervical precautions into ADLs. Pt would benefit from skilled OT to address independence with ADLs.     Follow Up Recommendations  No OT follow up;Supervision/Assistance - 24 hour    Equipment Recommendations  3 in 1 bedside comode    Recommendations for Other Services       Precautions / Restrictions Precautions Precautions: Fall;Cervical Required Braces or Orthoses: Cervical Brace Cervical Brace: At all times;Hard collar Restrictions Weight Bearing Restrictions: No      Mobility Bed Mobility               General bed mobility comments: Pt just returned to bed and declined OOB.  Transfers                 General transfer comment: Pt just returned to bed and declined OOB.         ADL Overall ADL's : Needs assistance/impaired Eating/Feeding: Modified independent;Sitting   Grooming: Oral care;Set up;Sitting   Upper Body Bathing: Set up;Sitting       Upper Body Dressing : Minimal assistance;Sitting (including c-collar)                     General ADL Comments: OT arrived just as pt was settling in bed after using BSC. Pt declined to get back OOB. Educated pt on cervical precautions and incorporating into ADLs. Pt performed grooming  sitting upright in bed (unsupported).      Vision  Pt reports no change from baseline. No apparent visual deficits.                    Perception Perception Perception Tested?: No   Praxis Praxis Praxis tested?: Within functional limits    Pertinent Vitals/Pain Pain Assessment: No/denies pain     Hand Dominance Right   Extremity/Trunk Assessment Upper Extremity Assessment Upper Extremity Assessment: Generalized weakness   Lower Extremity Assessment Lower Extremity Assessment: Generalized weakness   Cervical / Trunk Assessment Cervical / Trunk Assessment: Kyphotic;Other exceptions Cervical / Trunk Exceptions: forward head   Communication Communication Communication: No difficulties   Cognition Arousal/Alertness: Awake/alert Behavior During Therapy: WFL for tasks assessed/performed Overall Cognitive Status: Within Functional Limits for tasks assessed                                Home Living Family/patient expects to be discharged to:: Private residence Living Arrangements: Alone;Children (pt lives alone but plans to stay with her son and/or daughte) Available Help at Discharge: Family;Available 24 hours/day Type of Home: House       Home Layout: One level     Bathroom Shower/Tub: Tub/shower unit (pt has tub shower but plans to stay with son who has walk-in)         Home Equipment:  Walker - 2 wheels;Cane - single point;Shower seat          Prior Functioning/Environment Level of Independence: Independent             OT Diagnosis: Generalized weakness;Acute pain   OT Problem List: Decreased strength;Decreased range of motion;Decreased knowledge of use of DME or AE;Decreased knowledge of precautions;Pain   OT Treatment/Interventions: Self-care/ADL training;Therapeutic exercise;Energy conservation;DME and/or AE instruction;Therapeutic activities;Patient/family education;Balance training    OT Goals(Current goals can be found in  the care plan section) Acute Rehab OT Goals Patient Stated Goal: none stated OT Goal Formulation: With patient Time For Goal Achievement: 08/16/14 Potential to Achieve Goals: Good ADL Goals Pt Will Perform Grooming: with supervision;standing Pt Will Perform Upper Body Dressing: with supervision;sitting Pt Will Perform Lower Body Dressing: with supervision;sit to/from stand Pt Will Transfer to Toilet: with supervision;ambulating;regular height toilet Pt Will Perform Toileting - Clothing Manipulation and hygiene: with supervision;sit to/from stand Pt Will Perform Tub/Shower Transfer: Tub transfer;Shower transfer;with supervision;ambulating;rolling walker  OT Frequency: Min 2X/week              End of Session Equipment Utilized During Treatment: Cervical collar  Activity Tolerance: Patient limited by fatigue Patient left: in bed;with call bell/phone within reach;with family/visitor present   Time: 1610-9604 OT Time Calculation (min): 26 min Charges:  OT General Charges $OT Visit: 1 Procedure OT Evaluation $Initial OT Evaluation Tier I: 1 Procedure OT Treatments $Self Care/Home Management : 8-22 mins  Rae Lips [phone] 08/02/2014, 7:07 PM

## 2014-08-03 LAB — GLUCOSE, CAPILLARY
Glucose-Capillary: 142 mg/dL — ABNORMAL HIGH (ref 70–99)
Glucose-Capillary: 114 mg/dL — ABNORMAL HIGH (ref 70–99)
Glucose-Capillary: 189 mg/dL — ABNORMAL HIGH (ref 70–99)
Glucose-Capillary: 170 mg/dL — ABNORMAL HIGH (ref 70–99)
Glucose-Capillary: 175 mg/dL — ABNORMAL HIGH (ref 70–99)

## 2014-08-03 NOTE — Progress Notes (Signed)
Occupational Therapy Treatment Patient Details Name: Danielle Clarke MRN: [id number] DOB: [birth year] Today's Date: 08/03/2014    History of present illness patient complaining of neck pain with radiation to both upper extremities associated with numbness, weakness and poor balance. Had a cervical fracture about 2 years ago pt s/p corpectomy C4-6   OT comments  Pt requires encouragement for OOB activity, but does relatively well.  Will have a walk in shower and is well equipped at son's home when ready for discharge. Educated pt in cervical precautions related to IADL.  Pt will have assist of her family for all of those activities.  Follow Up Recommendations  No OT follow up;Supervision/Assistance - 24 hour    Equipment Recommendations  3 in 1 bedside comode    Recommendations for Other Services      Precautions / Restrictions Precautions Precautions: Fall;Cervical Required Braces or Orthoses: Cervical Brace Cervical Brace: At all times;Hard collar       Mobility Bed Mobility Overal bed mobility: Needs Assistance Bed Mobility: Rolling;Sidelying to Sit;Sit to Sidelying Rolling: Supervision Sidelying to sit: Min guard;HOB elevated     Sit to sidelying: Min assist;HOB elevated General bed mobility comments: instructed in log roll technique, declined working from flat bed.  Transfers Overall transfer level: Needs assistance Equipment used: 1 person hand held assist Transfers: Stand Pivot Transfers Sit to Stand: Min assist Stand pivot transfers: Min assist            Balance                                   ADL Overall ADL's : Needs assistance/impaired     Grooming: Min guard;Wash/dry hands;Standing               Lower Body Dressing: Modified independent (able to access feet by crossing foot over knee to doff socks)   Toilet Transfer: Minimal assistance   Toileting- Clothing Manipulation and Hygiene: Min guard;Sit to/from stand          General ADL Comments: Pt required encouragement for OOB, agreeable to toileting and grooming.      Vision                     Perception     Praxis      Cognition   Behavior During Therapy: WFL for tasks assessed/performed Overall Cognitive Status: Within Functional Limits for tasks assessed                       Extremity/Trunk Assessment               Exercises     Shoulder Instructions       General Comments      Pertinent Vitals/ Pain       Pain Assessment: No/denies pain (premedicated) Pain Intervention(s): Premedicated before session  Home Living                                          Prior Functioning/Environment              Frequency Min 2X/week     Progress Toward Goals  OT Goals(current goals can now be found in the care plan section)  Progress towards OT goals: Progressing toward goals     Plan Discharge  plan remains appropriate    Co-evaluation                 End of Session Equipment Utilized During Treatment: Cervical collar   Activity Tolerance Patient tolerated treatment well   Patient Left in bed;with call bell/phone within reach;with family/visitor present   Nurse Communication          Time: 4540-9811 OT Time Calculation (min): 24 min  Charges: OT General Charges $OT Visit: 1 Procedure OT Treatments $Self Care/Home Management : 23-37 mins  Evern Bio 08/03/2014, 2:46 PM [phone]

## 2014-08-03 NOTE — Progress Notes (Signed)
Physical Therapy Treatment Patient Details Name: Danielle Clarke MRN: [id number] DOB: 1941/07/17 Today's Date: 08/03/2014    History of Present Illness patient complaining of neck pain with radiation to both upper extremities associated with numbness, weakness and poor balance. Had a cervical fracture about 2 years ago pt s/p corpectomy C4-6    PT Comments    Progressing well.  Still mildly unsteady gait that improves the longer she is up and before fatigue.  Reinforced all education to pt and daughter.  Follow Up Recommendations  Supervision/Assistance - 24 hour;CIR     Equipment Recommendations  None recommended by PT    Recommendations for Other Services OT consult     Precautions / Restrictions Precautions Precautions: Fall;Cervical Required Braces or Orthoses: Cervical Brace Cervical Brace: At all times;Hard collar Restrictions Weight Bearing Restrictions: No    Mobility  Bed Mobility Overal bed mobility: Needs Assistance Bed Mobility: Rolling;Sidelying to Sit;Sit to Sidelying Rolling: Supervision Sidelying to sit: Min guard;HOB elevated     Sit to sidelying: Min assist;HOB elevated General bed mobility comments: discussed and demo'd bed mobility, but pt did not practice today  Transfers Overall transfer level: Needs assistance Equipment used: Rolling walker (2 wheeled) Transfers: Sit to/from Stand Sit to Stand: Min assist Stand pivot transfers: Min assist       General transfer comment: reinforced technique, hand placement  Ambulation/Gait Ambulation/Gait assistance: Min assist;Min guard Ambulation Distance (Feet): 200 Feet Assistive device: Rolling walker (2 wheeled) Gait Pattern/deviations: Step-through pattern;Decreased step length - right;Decreased step length - left;Decreased stride length;Shuffle;Narrow base of support Gait velocity: slow   General Gait Details: initially shuffled and mildly staggery steps that smoothed out with  distance.   Stairs            Wheelchair Mobility    Modified Rankin (Stroke Patients Only)       Balance Overall balance assessment: Needs assistance Sitting-balance support: No upper extremity supported Sitting balance-Leahy Scale: Fair     Standing balance support: No upper extremity supported Standing balance-Leahy Scale: Poor                      Cognition Arousal/Alertness: Awake/alert Behavior During Therapy: WFL for tasks assessed/performed Overall Cognitive Status: Within Functional Limits for tasks assessed                      Exercises      General Comments General comments (skin integrity, edema, etc.): Reinforced all neck care and precautions, bracing issues  (need more pads), bed mobility, lifiting restrictions, and progression of activity,      Pertinent Vitals/Pain Pain Assessment: No/denies pain Pain Intervention(s): Premedicated before session    Home Living                      Prior Function            PT Goals (current goals can now be found in the care plan section) Acute Rehab PT Goals PT Goal Formulation: With patient/family Time For Goal Achievement: 08/15/14 Potential to Achieve Goals: Good Progress towards PT goals: Progressing toward goals    Frequency  Min 5X/week    PT Plan Current plan remains appropriate    Co-evaluation             End of Session Equipment Utilized During Treatment: Cervical collar Activity Tolerance: Patient tolerated treatment well Patient left: in chair;with call bell/phone within reach;with nursing/sitter in room  Time: 5366-4403 PT Time Calculation (min): 32 min  Charges:  $Gait Training: 8-22 mins $Therapeutic Activity: 8-22 mins                    G Codes:      Tattiana Fakhouri, Eliseo Gum 08/03/2014, 6:00 PM 08/03/2014  [place] Bing, PT [phone]  (pager)

## 2014-08-03 NOTE — Progress Notes (Signed)
Subjective: Patient reports nausea, vomiting, arm numbness  Objective: Vital signs in last 24 hours: Temp:  [97.9 F (36.6 C)-99.9 F (37.7 C)] 97.9 F (36.6 C) (09/07 0959) Pulse Rate:  [89-105] 89 (09/07 0959) Resp:  [16-18] 16 (09/07 0959) BP: (117-163)/(37-72) 127/45 mmHg (09/07 0959) SpO2:  [92 %-100 %] 93 % (09/07 0959) Weight:  [53.524 kg (118 lb)] 53.524 kg (118 lb) (09/06 1300)  Intake/Output from previous day: 09/06 0701 - 09/07 0700 In: 780 [P.O.:480; I.V.:300] Out: 250 [Urine:250] Intake/Output this shift:    Physical Exam: Dressing CDI.  Dysphagia persists with nausea and vomiting.  Strength full all groups  Lab Results: No results found for this basename: WBC, HGB, HCT, PLT,  in the last 72 hours BMET No results found for this basename: NA, K, CL, CO2, GLUCOSE, BUN, CREATININE, CALCIUM,  in the last 72 hours  Studies/Results: No results found.  Assessment/Plan: Zofran for nausea.  Will let patient drink ginger ale.  Continue PT/OT.    LOS: 3 days    Dorian Heckle, MD 08/03/2014, 12:08 PM

## 2014-08-03 NOTE — Progress Notes (Signed)
Pt calls RN to room informing her she feels her blood sugar is dropping stating "I think she gave me insulin and I'm beginning to feel nauseated and have the shake", pt noted to be anxious; pt blood sugar checked to be 114. Pt relaxed and calmed down after seeing cbg results; pt given orange juice, breakfast tray delivered; given something for her nausea, In bed with call light within reach. Will continue to monitor quietly. P.Amo Azalea Cedar RN.

## 2014-08-03 NOTE — Progress Notes (Signed)
Rehab Admissions Coordinator Note:  Patient was screened by Trish Mage for appropriateness for an Inpatient Acute Rehab Consult.  At this time, we are recommending HH.  Likely will progress to be able to discharge home with [facility] services and supervision.  Trish Mage 08/03/2014, 8:48 AM  I can be reached at [phone].

## 2014-08-03 NOTE — Care Management Note (Addendum)
  Page 1 of 1   08/03/2014     3:04:50 PM CARE MANAGEMENT NOTE 08/03/2014  Patient:  Danielle Clarke, Danielle Clarke   Account Number:  [id number]  Date Initiated:  08/03/2014  Documentation initiated by:  Elmer Bales  Subjective/Objective Assessment:   Patient was admitted with stenosis, spondylolithesis.     Action/Plan:   will follow for discharge needs pending PT/OT evals and physician orders.   Anticipated DC Date:     Anticipated DC Plan:  HOME W HOME HEALTH SERVICES      DC Planning Services  CM consult      Choice offered to / List presented to:             Status of service:  In process, will continue to follow Medicare Important Message given?  YES (If response is "NO", the following Medicare IM given date fields will be blank) Date Medicare IM given:  08/03/2014 Medicare IM given by:  Elmer Bales Date Additional Medicare IM given:   Additional Medicare IM given by:    Discharge Disposition:    Per UR Regulation:    If discussed at Long Length of Stay Meetings, dates discussed:    Comments:  08/03/14 1150 Elmer Bales RN, MSN, CM- Medicare IM letter provided.

## 2014-08-04 ENCOUNTER — Inpatient Hospital Stay (HOSPITAL_COMMUNITY): Payer: Medicare Other

## 2014-08-04 ENCOUNTER — Encounter (HOSPITAL_COMMUNITY): Payer: Self-pay | Admitting: Neurosurgery

## 2014-08-04 LAB — GLUCOSE, CAPILLARY
Glucose-Capillary: 175 mg/dL — ABNORMAL HIGH (ref 70–99)
Glucose-Capillary: 257 mg/dL — ABNORMAL HIGH (ref 70–99)
Glucose-Capillary: 157 mg/dL — ABNORMAL HIGH (ref 70–99)
Glucose-Capillary: 176 mg/dL — ABNORMAL HIGH (ref 70–99)

## 2014-08-04 NOTE — Progress Notes (Signed)
Patient ID: Danielle Clarke, female   DOB: October 01, 1941, 73 y.o.   MRN: [id number] Wound dry. Eating. Have trouble with fine movement of hands. Plan to dc in am to her son house

## 2014-08-04 NOTE — Progress Notes (Signed)
Physical Therapy Treatment Patient Details Name: Danielle Clarke MRN: [id number] DOB: 02/23/1941 Today's Date: 08/04/2014    History of Present Illness patient complaining of neck pain with radiation to both upper extremities associated with numbness, weakness and poor balance. Had a cervical fracture about 2 years ago pt s/p corpectomy C4-6    PT Comments    Pt with improved ambulation this date. Pt con't to require v/c's for optimal transfer technique to adhere to cervical precaution. Pt con't to require 24/7 supervision for safe d/c home with use of RW.   Follow Up Recommendations  Home health PT;Supervision/Assistance - 24 hour     Equipment Recommendations  Rolling walker with 5" wheels    Recommendations for Other Services       Precautions / Restrictions Precautions Precautions: Fall;Cervical Precaution Comments: re-educated pt on precautions and how to modify mvmts Required Braces or Orthoses: Cervical Brace Cervical Brace: At all times;Hard collar Restrictions Weight Bearing Restrictions: No    Mobility  Bed Mobility Overal bed mobility: Needs Assistance Bed Mobility: Rolling;Sidelying to Sit;Sit to Sidelying Rolling: Supervision Sidelying to sit: Min guard;HOB elevated     Sit to sidelying: Min guard;HOB elevated General bed mobility comments: pt reports the bed she'll be sleeping with have the option to raise HOB. v/c's to con't to complete log roll  Transfers Overall transfer level: Needs assistance Equipment used: Rolling walker (2 wheeled) Transfers: Sit to/from Stand Sit to Stand: Min assist         General transfer comment: v/c's for safe hand placement  Ambulation/Gait Ambulation/Gait assistance: Min guard Ambulation Distance (Feet): 150 Feet Assistive device: Rolling walker (2 wheeled) Gait Pattern/deviations: Step-through pattern;Decreased stride length;Trunk flexed Gait velocity: slow   General Gait Details: v/c's to "stand upright".  no episodse of LOB. pt with report of fatigue.   Stairs            Wheelchair Mobility    Modified Rankin (Stroke Patients Only)       Balance           Standing balance support: No upper extremity supported;During functional activity   Standing balance comment: pt able to stand at sink and wash hands without LOB                    Cognition Arousal/Alertness: Awake/alert Behavior During Therapy: WFL for tasks assessed/performed Overall Cognitive Status: Within Functional Limits for tasks assessed                      Exercises      General Comments General comments (skin integrity, edema, etc.): pt assisted to bathroom. supervision for transfer on/off commode and pericare      Pertinent Vitals/Pain Pain Assessment: 0-10 Pain Score: 5  Pain Location: R shoulder Pain Descriptors / Indicators: Aching Pain Intervention(s): Monitored during session    Home Living                      Prior Function            PT Goals (current goals can now be found in the care plan section) Progress towards PT goals: Progressing toward goals    Frequency  Min 5X/week    PT Plan Current plan remains appropriate    Co-evaluation             End of Session Equipment Utilized During Treatment: Cervical collar Activity Tolerance: Patient tolerated treatment well Patient left: in bed;with  call bell/phone within reach;with bed alarm set     Time: 1246-1313 PT Time Calculation (min): 27 min  Charges:  $Gait Training: 8-22 mins $Therapeutic Activity: 8-22 mins                    G Codes:      Marcene Brawn 08/04/2014, 2:19 PM  Lewis Shock, PT, DPT Pager #: [phone] Office #: [phone]

## 2014-08-04 NOTE — Progress Notes (Signed)
UR COMPLETED  

## 2014-08-05 LAB — GLUCOSE, CAPILLARY
Glucose-Capillary: 133 mg/dL — ABNORMAL HIGH (ref 70–99)
Glucose-Capillary: 268 mg/dL — ABNORMAL HIGH (ref 70–99)

## 2014-08-05 NOTE — Progress Notes (Signed)
Talked to patient with son present about DCP/ HHC choices, pt requested [facility]; Lupita Leash with [facility] called for arrangements; Alexis Goodell [phone]

## 2014-08-05 NOTE — Discharge Summary (Signed)
Physician Discharge Summary  Patient ID: Danielle Clarke MRN: [id number] DOB/AGE: 72-Sep-1942 72 y.o.  Admit date: 07/31/2014 Discharge date: 08/05/2014  Admission Diagnoses:cervical stenosis  Discharge Diagnoses:  Active Problems:   Cervical stenosis of spinal canal   Discharged Condition: walking, no pain  Hospital Course: surgery  Consults: none  Significant Diagnostic Studies: mri  Treatments: corpectomies and fusion  Discharge Exam: Blood pressure 143/47, pulse 85, temperature 97.6 F (36.4 C), temperature source Oral, resp. rate 20, height  (1.575 m), weight 53.524 kg (118 lb), SpO2 96.00%. Ambulating.   Disposition: home with [facility]     Medication List    ASK your doctor about these medications       acetaminophen 500 MG tablet  Commonly known as:  TYLENOL  Take 1,000 mg by mouth 4 (four) times daily.     aspirin EC 81 MG tablet  Take 81 mg by mouth daily.     cholecalciferol 1000 UNITS tablet  Commonly known as:  VITAMIN D  Take 1,000 Units by mouth daily.     Chromium Picolinate 1000 MCG Tabs  Take 1,000 mcg by mouth daily.     CINNAMON PO  Take 1 tablet by mouth 3 (three) times daily. Dose is 1200 mg     cyclobenzaprine 5 MG tablet  Commonly known as:  FLEXERIL  Take 5 mg by mouth 3 (three) times daily as needed for muscle spasms (muscle spasm).     diclofenac 75 MG EC tablet  Commonly known as:  VOLTAREN  Take 75 mg by mouth daily.     Flaxseed Oil 1200 MG Caps  Take 1,200 mg by mouth 3 (three) times daily.     GLUCOSAMINE CHONDR COMPLEX PO  Take 1 tablet by mouth 2 (two) times daily.     insulin aspart 100 UNIT/ML injection  Commonly known as:  novoLOG  Inject 2-6 Units into the skin See admin instructions. Sliding scale. Base units = Reading below 80 subtract 1; 80-150 take 2 units (base dose); 151-200 add 1 unit; 201-250 add 2 units; 251-300 add 3 units; 301-350 add 4 units; 351-400 add 5 units; 401-450 add 6 units     insulin  glargine 100 UNIT/ML injection  Commonly known as:  LANTUS  Inject 14 Units into the skin every morning.     magnesium oxide 400 MG tablet  Commonly known as:  MAG-OX  Take 400 mg by mouth daily.     multivitamin with minerals Tabs tablet  Take 1 tablet by mouth every morning.     ofloxacin 0.3 % ophthalmic solution  Commonly known as:  OCUFLOX  Place 1 drop into the right eye 4 (four) times daily.     simvastatin 40 MG tablet  Commonly known as:  ZOCOR  Take 40 mg by mouth every evening.     traMADol 50 MG tablet  Commonly known as:  ULTRAM  Take 50 mg by mouth 2 (two) times daily.     Turmeric 500 MG Caps  Take 500 mg by mouth daily.     VENTOLIN HFA 108 (90 BASE) MCG/ACT inhaler  Generic drug:  albuterol  Inhale 1-2 puffs into the lungs every 4 (four) hours as needed for wheezing or shortness of breath.         Signed: Karn Cassis 08/05/2014, 9:49 AM

## 2014-08-05 NOTE — Progress Notes (Signed)
Patient discharging home with family.  Paperwork and education complete.  IV removed and prescription for pain medicine given to patient's son.  Lance Bosch, Rn

## 2014-08-05 NOTE — Progress Notes (Signed)
PT Cancellation Note  Patient Details Name: Danielle Clarke MRN: [id number] DOB: July 24, 1941   Cancelled Treatment:    Reason Eval/Treat Not Completed: Pain limiting ability to participate. Pt declined due to R UE and bilat shld pain. Awaiting for dtr-in-law to come get her to assist her with a shower prior to d/c home today. PT to return if needed.   Marcene Brawn 08/05/2014, 3:25 PM  Lewis Shock, PT, DPT Pager #: [phone] Office #: [phone]

## 2014-08-05 NOTE — Progress Notes (Signed)
Medicated patient for the ride home per patient request

## 2014-08-06 LAB — GLUCOSE, CAPILLARY: Glucose-Capillary: 327 mg/dL — ABNORMAL HIGH (ref 70–99)

## 2014-08-12 NOTE — Op Note (Signed)
NAMEJAMAIA, BRUM NO.:  [id number]  MEDICAL RECORD NO.:  [id number]  LOCATION:  [id number]                        FACILITY:  [facility]  PHYSICIAN:  Hilda Lias, M.D.   DATE OF BIRTH:  02/10/1941  DATE OF PROCEDURE:  07/31/2014 DATE OF DISCHARGE:  08/05/2014                              OPERATIVE REPORT   ADDENDUM:  The patient had corpectomy at the cervical 4, 5, 6.  The corpectomy was done using the drill and it did involve  the center and the lateral aspects leaving only about 2-3 mm of the bone at each side  to be able to accommodate the allograft.  The corpectomy was between 80-90% of all those three levels.          ______________________________ Hilda Lias, M.D.     EB/MEDQ  D:  08/11/2014  T:  08/12/2014  Job:  [id number]

## 2014-10-04 ENCOUNTER — Encounter: Payer: Self-pay | Admitting: *Deleted

## 2014-10-07 ENCOUNTER — Emergency Department (HOSPITAL_COMMUNITY)
Admission: EM | Admit: 2014-10-07 | Discharge: 2014-10-07 | Disposition: A | Discharge status: Home / Self Care | Payer: Medicare Other | Attending: Emergency Medicine | Admitting: Emergency Medicine

## 2014-10-07 ENCOUNTER — Encounter (HOSPITAL_COMMUNITY): Payer: Self-pay | Admitting: Emergency Medicine

## 2014-10-07 ENCOUNTER — Emergency Department (HOSPITAL_COMMUNITY): Payer: Medicare Other

## 2014-10-07 DIAGNOSIS — S0181XA Laceration without foreign body of other part of head, initial encounter: Secondary | ICD-10-CM | POA: Diagnosis not present

## 2014-10-07 DIAGNOSIS — Z87828 Personal history of other (healed) physical injury and trauma: Secondary | ICD-10-CM | POA: Insufficient documentation

## 2014-10-07 DIAGNOSIS — E109 Type 1 diabetes mellitus without complications: Secondary | ICD-10-CM | POA: Diagnosis not present

## 2014-10-07 DIAGNOSIS — Y998 Other external cause status: Secondary | ICD-10-CM | POA: Insufficient documentation

## 2014-10-07 DIAGNOSIS — Z9842 Cataract extraction status, left eye: Secondary | ICD-10-CM | POA: Insufficient documentation

## 2014-10-07 DIAGNOSIS — Z79899 Other long term (current) drug therapy: Secondary | ICD-10-CM | POA: Diagnosis not present

## 2014-10-07 DIAGNOSIS — M1990 Unspecified osteoarthritis, unspecified site: Secondary | ICD-10-CM | POA: Diagnosis not present

## 2014-10-07 DIAGNOSIS — Y92003 Bedroom of unspecified non-institutional (private) residence as the place of occurrence of the external cause: Secondary | ICD-10-CM | POA: Insufficient documentation

## 2014-10-07 DIAGNOSIS — Z794 Long term (current) use of insulin: Secondary | ICD-10-CM | POA: Insufficient documentation

## 2014-10-07 DIAGNOSIS — R197 Diarrhea, unspecified: Secondary | ICD-10-CM | POA: Diagnosis not present

## 2014-10-07 DIAGNOSIS — Z8781 Personal history of (healed) traumatic fracture: Secondary | ICD-10-CM | POA: Insufficient documentation

## 2014-10-07 DIAGNOSIS — E785 Hyperlipidemia, unspecified: Secondary | ICD-10-CM | POA: Insufficient documentation

## 2014-10-07 DIAGNOSIS — IMO0002 Reserved for concepts with insufficient information to code with codable children: Secondary | ICD-10-CM

## 2014-10-07 DIAGNOSIS — Z791 Long term (current) use of non-steroidal anti-inflammatories (NSAID): Secondary | ICD-10-CM | POA: Diagnosis not present

## 2014-10-07 DIAGNOSIS — Z9841 Cataract extraction status, right eye: Secondary | ICD-10-CM | POA: Diagnosis not present

## 2014-10-07 DIAGNOSIS — Z23 Encounter for immunization: Secondary | ICD-10-CM | POA: Insufficient documentation

## 2014-10-07 DIAGNOSIS — I10 Essential (primary) hypertension: Secondary | ICD-10-CM | POA: Diagnosis not present

## 2014-10-07 DIAGNOSIS — W01190A Fall on same level from slipping, tripping and stumbling with subsequent striking against furniture, initial encounter: Secondary | ICD-10-CM | POA: Insufficient documentation

## 2014-10-07 DIAGNOSIS — W19XXXA Unspecified fall, initial encounter: Secondary | ICD-10-CM

## 2014-10-07 DIAGNOSIS — E875 Hyperkalemia: Secondary | ICD-10-CM | POA: Insufficient documentation

## 2014-10-07 DIAGNOSIS — J45909 Unspecified asthma, uncomplicated: Secondary | ICD-10-CM | POA: Insufficient documentation

## 2014-10-07 DIAGNOSIS — Z792 Long term (current) use of antibiotics: Secondary | ICD-10-CM | POA: Diagnosis not present

## 2014-10-07 DIAGNOSIS — S0990XA Unspecified injury of head, initial encounter: Secondary | ICD-10-CM | POA: Diagnosis present

## 2014-10-07 DIAGNOSIS — Y9389 Activity, other specified: Secondary | ICD-10-CM | POA: Insufficient documentation

## 2014-10-07 LAB — CBC WITH DIFFERENTIAL/PLATELET
Basophils Absolute: 0.1 10*3/uL (ref 0.0–0.1)
Basophils Relative: 2 % — ABNORMAL HIGH (ref 0–1)
Eosinophils Absolute: 0.6 10*3/uL (ref 0.0–0.7)
Eosinophils Relative: 8 % — ABNORMAL HIGH (ref 0–5)
HCT: 32.3 % — ABNORMAL LOW (ref 36.0–46.0)
Hemoglobin: 10.5 g/dL — ABNORMAL LOW (ref 12.0–15.0)
Lymphocytes Relative: 31 % (ref 12–46)
Lymphs Abs: 2.3 10*3/uL (ref 0.7–4.0)
MCH: 28.2 pg (ref 26.0–34.0)
MCHC: 32.5 g/dL (ref 30.0–36.0)
MCV: 86.6 fL (ref 78.0–100.0)
Monocytes Absolute: 0.6 10*3/uL (ref 0.1–1.0)
Monocytes Relative: 8 % (ref 3–12)
Neutro Abs: 3.8 10*3/uL (ref 1.7–7.7)
Neutrophils Relative %: 51 % (ref 43–77)
Platelets: 316 10*3/uL (ref 150–400)
RBC: 3.73 MIL/uL — ABNORMAL LOW (ref 3.87–5.11)
RDW: 14.6 % (ref 11.5–15.5)
WBC: 7.3 10*3/uL (ref 4.0–10.5)

## 2014-10-07 LAB — COMPREHENSIVE METABOLIC PANEL
ALT: 17 U/L (ref 0–35)
AST: 24 U/L (ref 0–37)
Albumin: 3.9 g/dL (ref 3.5–5.2)
Alkaline Phosphatase: 84 U/L (ref 39–117)
Anion gap: 9 (ref 5–15)
BUN: 16 mg/dL (ref 6–23)
CO2: 30 mEq/L (ref 19–32)
Calcium: 9.3 mg/dL (ref 8.4–10.5)
Chloride: 100 mEq/L (ref 96–112)
Creatinine, Ser: 1.01 mg/dL (ref 0.50–1.10)
GFR calc Af Amer: 63 mL/min — ABNORMAL LOW (ref >90)
GFR calc non Af Amer: 54 mL/min — ABNORMAL LOW (ref >90)
Glucose, Bld: 143 mg/dL — ABNORMAL HIGH (ref 70–99)
Potassium: 5.5 mEq/L — ABNORMAL HIGH (ref 3.7–5.3)
Sodium: 139 mEq/L (ref 137–147)
Total Bilirubin: <0.2 mg/dL — ABNORMAL LOW (ref 0.3–1.2)
Total Protein: 6.9 g/dL (ref 6.0–8.3)

## 2014-10-07 LAB — URINALYSIS, ROUTINE W REFLEX MICROSCOPIC
Bilirubin Urine: NEGATIVE
Glucose, UA: NEGATIVE mg/dL
Hgb urine dipstick: NEGATIVE
Ketones, ur: NEGATIVE mg/dL
Leukocytes, UA: NEGATIVE
Nitrite: NEGATIVE
Protein, ur: NEGATIVE mg/dL
Specific Gravity, Urine: 1.015 (ref 1.005–1.030)
Urobilinogen, UA: 0.2 mg/dL (ref 0.0–1.0)
pH: 7.5 (ref 5.0–8.0)

## 2014-10-07 LAB — LIPASE, BLOOD: Lipase: 19 U/L (ref 11–59)

## 2014-10-07 LAB — CBG MONITORING, ED: Glucose-Capillary: 83 mg/dL (ref 70–99)

## 2014-10-07 MED ORDER — TETANUS-DIPHTH-ACELL PERTUSSIS 5-2.5-18.5 LF-MCG/0.5 IM SUSP
0.5000 mL | Freq: Once | INTRAMUSCULAR | Status: AC
  Administered 2014-10-07: 0.5 mL via INTRAMUSCULAR
  Filled 2014-10-07: qty 0.5

## 2014-10-07 NOTE — ED Notes (Signed)
Pt states that she fell on Tuesday when she woke up at 0300 to use the bathroom.  Was having diarrhea.  Now having intermittent diarrhea.  Facial pain, 2 black eyes.

## 2014-10-07 NOTE — ED Notes (Addendum)
Pt fell early Tuesday morning and hit her head on night stand pt did not loose consciousness. Pt doctors wanted her to get checked out due to having surgery on her neck in September she had C4,C5 C6 removed and replaced cadaver bones and plates.

## 2014-10-07 NOTE — ED Provider Notes (Signed)
CSN: [id number]     Arrival date & time 10/07/14  1616 History   First MD Initiated Contact with Patient 10/07/14 1802     Chief Complaint  Patient presents with  . Fall  . Facial Pain  . Diarrhea     (Consider location/radiation/quality/duration/timing/severity/associated sxs/prior Treatment) HPI Comments: The patient is a 73 year old female past no history of diabetes, hypertension presenting to emergency room chief complaint of head injury after fall 2 days ago. Patient reports trying to getting up from her bed at approximately 0300 due to diarrhea, she reports weakness in lower extremities and fell and struck her nightstand denies loss of consciousness. Pt reports bilateral lower extremity weakness since surgery, surgeon aware. Patient was able to a bili to bathroom. She reports taking her glucose was 45 at that time patient is able to drink and eat and call family member. Patient reports a small laceration inside of impact. Patient denies syncope, vision change, numbness, tingling, Neck pain since incident, right trapezius area. Patient reports diarrhea resolved today after Imodium use. Anterior cervical corpectomy and fusion with fusion plate spanning from C3 to C7 9/4/2-15 bu Dr. Jeral FruitBotero.  Patient is a 73 y.o. female presenting with fall and diarrhea. The history is provided by the patient. No language interpreter was used.  Fall Pertinent negatives include no abdominal pain, fever, headaches or vomiting.  Diarrhea Associated symptoms: no abdominal pain, no fever, no headaches and no vomiting     Past Medical History  Diagnosis Date  . Arthritis   . Neck fracture     july 2013  . Hyperlipemia   . Hypertension     Dr. [name] ~ 2 years ago  . Asthmatic bronchitis     with colds per patient  . Type 1 diabetes mellitus    Past Surgical History  Procedure Laterality Date  . Left ankle surgery  2001    steel plate and 3 screws   . Total shoulder replacement  2010    right  shoulder   . Breast surgery  1988  . Tonsillectomy and adenoidectomy  1948  . Tubal ligation  1979  . Hammer toe surgery  1998  . Cataract extraction Bilateral 2011    right and then left  . Tympanostomy tube placement Bilateral   . Anterior cervical corpectomy N/A 07/31/2014    Procedure: Cervical Four to Cervical Six Corpectomy;  Surgeon: Karn CassisErnesto M Botero, MD;  Location: [facility];  Service: Neurosurgery;  Laterality: N/A;  C4 to C6 Corpectomy   Family History  Problem Relation Age of Onset  . Cancer Mother   . Heart Problems Father    History  Substance Use Topics  . Smoking status: Never Smoker   . Smokeless tobacco: Never Used  . Alcohol Use: 0.6 oz/week    1 Glasses of wine per week     Comment: occasionally   OB History    No data available     Review of Systems  Constitutional: Negative for fever.  Eyes: Negative for photophobia and visual disturbance.  Gastrointestinal: Positive for diarrhea. Negative for vomiting and abdominal pain.  Skin: Positive for wound.  Neurological: Negative for light-headedness and headaches.      Allergies  Cefaclor; Tetracycline; Vancomycin; Augmentin; and Prednisone  Home Medications   Prior to Admission medications   Medication Sig Start Date End Date Taking? Authorizing Provider  acetaminophen (TYLENOL) 500 MG tablet Take 500 mg by mouth 2 (two) times daily.    Yes Historical Provider,  MD  aspirin EC 81 MG tablet Take 81 mg by mouth daily.   Yes Historical Provider, MD  cholecalciferol (VITAMIN D) 1000 UNITS tablet Take 1,000 Units by mouth daily.    Yes Historical Provider, MD  Chromium Picolinate 1000 MCG TABS Take 1,000 mcg by mouth daily.    Yes Historical Provider, MD  CINNAMON PO Take 1 tablet by mouth 3 (three) times daily. Dose is 1200 mg   Yes Historical Provider, MD  cyclobenzaprine (FLEXERIL) 5 MG tablet Take 5 mg by mouth 3 (three) times daily as needed for muscle spasms (muscle spasm).  07/08/13  Yes Historical  Provider, MD  diclofenac (VOLTAREN) 75 MG EC tablet Take 75 mg by mouth daily.   Yes Historical Provider, MD  Flaxseed, Linseed, (FLAXSEED OIL) 1200 MG CAPS Take 1,200 mg by mouth 3 (three) times daily.   Yes Historical Provider, MD  Glucosamine-Chondroitin (GLUCOSAMINE CHONDR COMPLEX PO) Take 1 tablet by mouth 2 (two) times daily.   Yes Historical Provider, MD  insulin aspart (NOVOLOG) 100 UNIT/ML injection Inject 2-6 Units into the skin See admin instructions. Sliding scale. Base= 2 units-Reading below 80 subtract 1; 80-150 take 2 units (base dose); 151-200 add 1 unit; 201-250 add 2 units; 251-300 add 3 units; 301-350 add 4 units; 351-400 add 5 units; 401-450 add 6 units   Yes Historical Provider, MD  insulin glargine (LANTUS) 100 UNIT/ML injection Inject 11 Units into the skin every morning.    Yes Historical Provider, MD  magnesium oxide (MAG-OX) 400 MG tablet Take 400 mg by mouth daily.   Yes Historical Provider, MD  Multiple Vitamin (MULTIVITAMIN WITH MINERALS) TABS Take 1 tablet by mouth every morning.   Yes Historical Provider, MD  ofloxacin (OCUFLOX) 0.3 % ophthalmic solution Place 1 drop into the right eye 4 (four) times daily.   Yes Historical Provider, MD  simvastatin (ZOCOR) 40 MG tablet Take 40 mg by mouth every evening.   Yes Historical Provider, MD  traMADol (ULTRAM) 50 MG tablet Take 50 mg by mouth 2 (two) times daily.    Yes Historical Provider, MD  Turmeric 500 MG CAPS Take 500 mg by mouth daily.   Yes Historical Provider, MD  VENTOLIN HFA 108 (90 BASE) MCG/ACT inhaler Inhale 1-2 puffs into the lungs every 4 (four) hours as needed for wheezing or shortness of breath.  05/13/13   Historical Provider, MD   BP 149/47 mmHg  Pulse 80  Temp(Src) 98.4 F (36.9 C) (Oral)  Resp 16  Ht 5\' 2"  (1.575 m)  Wt 114 lb (51.71 kg)  BMI 20.85 kg/m2  SpO2 100% Physical Exam  Constitutional: She is oriented to person, place, and time. She appears well-developed and well-nourished.  HENT:   Head: Normocephalic. Head is with raccoon's eyes. Head is without Battle's sign.  Bilateral TM tubes in place. 0.5 cm laceration to left forehead. No bleeding.  Eyes: EOM are normal.  Post surgical changes noted  Neck: No spinous process tenderness and no muscular tenderness present. No rigidity. Normal range of motion present.  Cardiovascular: Normal rate and regular rhythm.   Pulses:      Radial pulses are 2+ on the right side, and 2+ on the left side.  Pulmonary/Chest: Effort normal. She has no wheezes. She has no rales.  Abdominal: Soft. There is no tenderness. There is no rebound and no guarding.  Neurological: She is alert and oriented to person, place, and time. She displays no atrophy. She exhibits normal muscle tone.  Reflex  Scores:      Bicep reflexes are 2+ on the right side and 2+ on the left side. Speech is clear and goal oriented, follows commands II-Visual fields were normal.   III/IV/VI-Pupils were minimally reactive. Extraocular movements were full and conjugate.  V/VII-no facial droop.   VIII-normal.   Motor: Strength 5/5 to upper and lower extremities bilaterally. Moves all 4 extremities equally. Sensory: normal sensation to upper and lower extremities.  Cerebellar: Normal finger to nose bilaterally No pronator drift. Normal gait unassisted.  Skin: Skin is dry.  Psychiatric: She has a normal mood and affect. Her behavior is normal.  Nursing note and vitals reviewed.   ED Course  Procedures (including critical care time) Labs Review Labs Reviewed  CBC WITH DIFFERENTIAL - Abnormal; Notable for the following:    RBC 3.73 (*)    Hemoglobin 10.5 (*)    HCT 32.3 (*)    Eosinophils Relative 8 (*)    Basophils Relative 2 (*)    All other components within normal limits  COMPREHENSIVE METABOLIC PANEL - Abnormal; Notable for the following:    Potassium 5.5 (*)    Glucose, Bld 143 (*)    Total Bilirubin <0.2 (*)    GFR calc non Af Amer 54 (*)    GFR calc Af Amer  63 (*)    All other components within normal limits  URINALYSIS, ROUTINE W REFLEX MICROSCOPIC - Abnormal; Notable for the following:    APPearance CLOUDY (*)    All other components within normal limits  LIPASE, BLOOD  CBG MONITORING, ED    Imaging Review Ct Head Wo Contrast  10/07/2014   CLINICAL DATA:  Headache, dizzy spells.  Fall on Tuesday.  EXAM: CT HEAD WITHOUT CONTRAST  TECHNIQUE: Contiguous axial images were obtained from the base of the skull through the vertex without intravenous contrast.  COMPARISON:  01/04/2013  FINDINGS: No acute intracranial abnormality. Specifically, no hemorrhage, hydrocephalus, mass lesion, acute infarction, or significant intracranial injury. No acute calvarial abnormality. Visualized paranasal sinuses and mastoids clear. Orbital soft tissues unremarkable.  IMPRESSION: No acute intracranial abnormality.   Electronically Signed   By: Charlett Nose M.D.   On: 10/07/2014 20:47     EKG Interpretation None      MDM   Final diagnoses:  Fall  Laceration  Hyperkalemia   Pt with history of fall several days ago, no neurologic deficits on exam. No cervical midline pain to suggest fracture or injury to previous surgery. CT negative for acute findings. Dr. Boykin Reaper wood also evaluated the patient during this encounter. Plan to discharge home, follow up with PCP and spine surgeon.    Mellody Drown, PA-C 10/10/14 1606  Juliet Rude. Rubin Payor, MD 10/12/14 1423

## 2014-10-07 NOTE — Discharge Instructions (Signed)
Call for a follow up appointment with a Family or Primary Care Provider for repeated potassium levels. Call Dr. Jeral FruitBotero for further evaluation of your neck after your fall. Return if Symptoms worsen.   Take medication as prescribed.

## 2014-10-13 ENCOUNTER — Other Ambulatory Visit: Payer: Self-pay | Admitting: Family Medicine

## 2014-10-13 DIAGNOSIS — Z1231 Encounter for screening mammogram for malignant neoplasm of breast: Secondary | ICD-10-CM

## 2014-11-04 ENCOUNTER — Ambulatory Visit
Admission: RE | Admit: 2014-11-04 | Discharge: 2014-11-04 | Disposition: A | Discharge status: Home / Self Care | Payer: Medicare Other | Source: Ambulatory Visit | Attending: Family Medicine | Admitting: Family Medicine

## 2014-11-04 DIAGNOSIS — Z1231 Encounter for screening mammogram for malignant neoplasm of breast: Secondary | ICD-10-CM

## 2015-01-12 ENCOUNTER — Ambulatory Visit
Admission: RE | Admit: 2015-01-12 | Discharge: 2015-01-12 | Disposition: A | Discharge status: Home / Self Care | Payer: Medicare Other | Source: Ambulatory Visit | Attending: Family Medicine | Admitting: Family Medicine

## 2015-01-12 ENCOUNTER — Other Ambulatory Visit: Payer: Self-pay | Admitting: Family Medicine

## 2015-01-12 DIAGNOSIS — R0781 Pleurodynia: Secondary | ICD-10-CM

## 2015-01-12 DIAGNOSIS — R059 Cough, unspecified: Secondary | ICD-10-CM

## 2015-01-12 DIAGNOSIS — R05 Cough: Secondary | ICD-10-CM

## 2015-02-02 ENCOUNTER — Ambulatory Visit (INDEPENDENT_AMBULATORY_CARE_PROVIDER_SITE_OTHER): Payer: Medicare Other

## 2015-02-02 ENCOUNTER — Ambulatory Visit (INDEPENDENT_AMBULATORY_CARE_PROVIDER_SITE_OTHER): Payer: Medicare Other | Admitting: Family Medicine

## 2015-02-02 VITALS — BP 140/52 | HR 67 | Temp 97.8°F | Resp 18 | Ht 62.0 in | Wt 113.0 lb

## 2015-02-02 DIAGNOSIS — S92302A Fracture of unspecified metatarsal bone(s), left foot, initial encounter for closed fracture: Secondary | ICD-10-CM

## 2015-02-02 DIAGNOSIS — M79672 Pain in left foot: Secondary | ICD-10-CM | POA: Diagnosis not present

## 2015-02-02 DIAGNOSIS — S99922A Unspecified injury of left foot, initial encounter: Secondary | ICD-10-CM

## 2015-02-02 MED ORDER — HYDROCODONE-ACETAMINOPHEN 5-325 MG PO TABS: 1.0000 | ORAL_TABLET | Freq: Four times a day (QID) | ORAL | Status: DC | PRN

## 2015-02-02 NOTE — Progress Notes (Addendum)
Subjective:  This chart was scribed for Shade FloodJeffrey R Derika Eckles, MD by Charline BillsEssence Howell, ED Scribe. The patient was seen in room 11. Patient's care was started at 2:27 PM.   Patient ID: Danielle Clarke, female    DOB: Nov 07, 1941, 74 y.o.   MRN: [id number]  Chief Complaint  Patient presents with  . Foot Injury    lt foot heard pop this morning   . Foot Pain   HPI HPI Comments: Danielle Clarke is a 74 y.o. female, with a h/o CKD stage 2, HTN, DM, anemia, hyperkalemia, who presents to the [facility] complaining of L foot pain onset this morning. Pt states that she was walking down the hallway of her home when she heard a loud pop and a snap in the bottom of her L foot. She reports previous injury to L ankle 15 years ago that required hardware. Pt has been treating with ice and elevation since the incident. Pt has a 4 prong walking cane and roller walker at home if need be but she does not use it normally.    PCP: Gaye AlkenBARNES,[name] STEWART, MD  Patient Active Problem List   Diagnosis Date Noted  . Cervical stenosis of spinal canal 07/31/2014  . CKD (chronic kidney disease) stage 2, GFR 60-89 ml/min 12/30/2013  . Essential hypertension, benign 12/30/2013  . Other and unspecified hyperlipidemia 12/30/2013  . Toe infection 12/30/2013  . Anemia 12/30/2013  . Acute renal failure 12/29/2013  . Hyperkalemia 12/29/2013  . Syncope 01/02/2013  . Diabetes mellitus type 1 01/02/2013  . Carotid bruit 01/02/2013  . GOITER, MULTINODULAR 02/11/2008   Past Medical History  Diagnosis Date  . Arthritis   . Neck fracture     july 2013  . Hyperlipemia   . Hypertension     Dr. [name] ~ 2 years ago  . Asthmatic bronchitis     with colds per patient  . Type 1 diabetes mellitus   . Cataract   . Osteoporosis   . Glaucoma    Past Surgical History  Procedure Laterality Date  . Left ankle surgery  2001    steel plate and 3 screws   . Total shoulder replacement  2010    right  shoulder   . Breast surgery  1988  . Tonsillectomy and adenoidectomy  1948  . Tubal ligation  1979  . Hammer toe surgery  1998  . Cataract extraction Bilateral 2011    right and then left  . Tympanostomy tube placement Bilateral   . Anterior cervical corpectomy N/A 07/31/2014    Procedure: Cervical Four to Cervical Six Corpectomy;  Surgeon: Karn CassisErnesto M Botero, MD;  Location: [facility];  Service: Neurosurgery;  Laterality: N/A;  C4 to C6 Corpectomy  . Eye surgery    . Joint replacement    . Fracture surgery     Allergies  Allergen Reactions  . Cefaclor Anaphylaxis  . Tetracycline Anaphylaxis  . Vancomycin Anaphylaxis  . Augmentin [Amoxicillin-Pot Clavulanate] Other (See Comments)    Reaction unknown  . Prednisone Other (See Comments)    Reaction unknown   Prior to Admission medications   Medication Sig Start Date End Date Taking? Authorizing Provider  acetaminophen (TYLENOL) 500 MG tablet Take 500 mg by mouth 2 (two) times daily.    Yes Historical Provider, MD  aspirin EC 81 MG tablet Take 81 mg by mouth daily.   Yes Historical Provider, MD  cholecalciferol (VITAMIN D) 1000 UNITS tablet Take 1,000 Units  by mouth daily.    Yes Historical Provider, MD  Chromium Picolinate 1000 MCG TABS Take 1,000 mcg by mouth daily.    Yes Historical Provider, MD  CINNAMON PO Take 1 tablet by mouth 3 (three) times daily. Dose is 1200 mg   Yes Historical Provider, MD  diclofenac (VOLTAREN) 75 MG EC tablet Take 75 mg by mouth daily.   Yes Historical Provider, MD  Flaxseed, Linseed, (FLAXSEED OIL) 1200 MG CAPS Take 1,200 mg by mouth 3 (three) times daily.   Yes Historical Provider, MD  Glucosamine-Chondroitin (GLUCOSAMINE CHONDR COMPLEX PO) Take 1 tablet by mouth 2 (two) times daily.   Yes Historical Provider, MD  insulin aspart (NOVOLOG) 100 UNIT/ML injection Inject 2-6 Units into the skin See admin instructions. Sliding scale. Base= 2 units-Reading below 80 subtract 1; 80-150 take 2 units (base dose);  151-200 add 1 unit; 201-250 add 2 units; 251-300 add 3 units; 301-350 add 4 units; 351-400 add 5 units; 401-450 add 6 units   Yes Historical Provider, MD  insulin glargine (LANTUS) 100 UNIT/ML injection Inject 11 Units into the skin every morning.    Yes Historical Provider, MD  magnesium oxide (MAG-OX) 400 MG tablet Take 400 mg by mouth daily.   Yes Historical Provider, MD  Multiple Vitamin (MULTIVITAMIN WITH MINERALS) TABS Take 1 tablet by mouth every morning.   Yes Historical Provider, MD  ofloxacin (OCUFLOX) 0.3 % ophthalmic solution Place 1 drop into the right eye 4 (four) times daily.   Yes Historical Provider, MD  simvastatin (ZOCOR) 40 MG tablet Take 40 mg by mouth every evening.   Yes Historical Provider, MD  traMADol (ULTRAM) 50 MG tablet Take 50 mg by mouth 2 (two) times daily.    Yes Historical Provider, MD  Turmeric 500 MG CAPS Take 500 mg by mouth daily.   Yes Historical Provider, MD  VENTOLIN HFA 108 (90 BASE) MCG/ACT inhaler Inhale 1-2 puffs into the lungs every 4 (four) hours as needed for wheezing or shortness of breath.  05/13/13  Yes Historical Provider, MD  cyclobenzaprine (FLEXERIL) 5 MG tablet Take 5 mg by mouth 3 (three) times daily as needed for muscle spasms (muscle spasm).  07/08/13   Historical Provider, MD   History   Social History  . Marital Status: Divorced    Spouse Name: N/A  . Number of Children: 2  . Years of Education: [employer]   Occupational History  . Retired    Social History Main Topics  . Smoking status: Never Smoker   . Smokeless tobacco: Never Used  . Alcohol Use: 0.6 oz/week    1 Glasses of wine per week     Comment: occasionally  . Drug Use: No  . Sexual Activity: Not on file   Other Topics Concern  . Not on file   Social History Narrative   Patient lives at home alone.   Caffeine Use:    Review of Systems  Musculoskeletal: Positive for arthralgias.      Objective:   Physical Exam  Constitutional: She is oriented to person,  place, and time. She appears well-developed and well-nourished. No distress.  HENT:  Head: Normocephalic and atraumatic.  Eyes: Conjunctivae and EOM are normal.  Neck: Neck supple. No tracheal deviation present.  Cardiovascular: Normal rate.   Pulmonary/Chest: Effort normal. No respiratory distress.  Musculoskeletal: Normal range of motion.  Hallus valgus deformity of L foot. Some inward deviation of great toe underneath second. Minimal ROM. Ankle nontender. Most tender along proximal  to mid aspect to fifth metatarsals. Other metatarsals are nontender. Some soft tissue swelling around the fifth metatarsal. NVI distally.   Neurological: She is alert and oriented to person, place, and time.  Skin: Skin is warm and dry.  Psychiatric: She has a normal mood and affect. Her behavior is normal.  Nursing note and vitals reviewed.  Filed Vitals:   02/02/15 1306  BP: 140/52  Pulse: 67  Temp: 97.8 F (36.6 C)  TempSrc: Oral  Resp: 18  Height: 5\' 2"  (1.575 m)  Weight: 113 lb (51.256 kg)  SpO2: 99%  [facility] reading (PRIMARY) by  Dr. Neva Seat: Nondisplaced fifth metatarsal fracture at the diaphysis just distal to the metaphyseal-diaphyseal junction.    Assessment & Plan:   Danielle Clarke is a 74 y.o. female Foot injury, left, initial encounter - Plan: DG Foot Complete Left, AMB referral to orthopedics, HYDROcodone-acetaminophen (NORCO/VICODIN) 5-325 MG per tablet  Fracture of 5th metatarsal, left, closed, initial encounter - Plan: AMB referral to orthopedics, HYDROcodone-acetaminophen (NORCO/VICODIN) 5-325 MG per tablet  -nondisplaced 5th metatarsal fracture, near metaphyseal/diaphyseal jxn.  Suspected fragility fracture. Placed in camwalker, minimal activity, use of 4pt cane or walker and refer to ortho. lortab if needed for pain unrelieved by tylenol. rtc precautions.   Meds ordered this encounter  Medications  . HYDROcodone-acetaminophen (NORCO/VICODIN) 5-325 MG per tablet    Sig: Take 1  tablet by mouth every 6 (six) hours as needed for moderate pain.    Dispense:  15 tablet    Refill:  0   Patient Instructions  Wear walking boot until seen by orthopaedic doctor. Use cane or walker for stability and avoid prolonged walking or weight on affected foot.  hydrocodone if needed for more severe pain, but this medicine can cause sedation, dizziness and increase risk of fall. Start with tylenol for pain if tolerated.   Metatarsal Fracture, Undisplaced A metatarsal fracture is a break in the bone(s) of the foot. These are the bones of the foot that connect your toes to the bones of the ankle. DIAGNOSIS  The diagnoses of these fractures are usually made with X-rays. If there are problems in the forefoot and x-rays are normal a later bone scan will usually make the diagnosis.  TREATMENT AND HOME CARE INSTRUCTIONS  Treatment may or may not include a cast or walking shoe. When casts are needed the use is usually for short periods of time so as not to slow down healing with muscle wasting (atrophy).  Activities should be stopped until further advised by your caregiver.  Wear shoes with adequate shock absorbing capabilities and stiff soles.  Alternative exercise may be undertaken while waiting for healing. These may include bicycling and swimming, or as your caregiver suggests.  It is important to keep all follow-up visits or specialty referrals. The failure to keep these appointments could result in improper bone healing and chronic pain or disability.  Warning: Do not drive a car or operate a motor vehicle until your caregiver specifically tells you it is safe to do so. IF YOU DO NOT HAVE A CAST OR SPLINT:  You may walk on your injured foot as tolerated or advised.  Do not put any weight on your injured foot for as long as directed by your caregiver. Slowly increase the amount of time you walk on the foot as the pain allows or as advised.  Use crutches until you can bear weight  without pain. A gradual increase in weight bearing may help.  Apply  ice to the injury for 15-20 minutes each hour while awake for the first 2 days. Put the ice in a plastic bag and place a towel between the bag of ice and your skin.  Only take over-the-counter or prescription medicines for pain, discomfort, or fever as directed by your caregiver. SEEK IMMEDIATE MEDICAL CARE IF:   Your cast gets damaged or breaks.  You have continued severe pain or more swelling than you did before the cast was put on, or the pain is not controlled with medications.  Your skin or nails below the injury turn blue or grey, or feel cold or numb.  There is a bad smell, or new stains or pus-like (purulent) drainage coming from the cast. MAKE SURE YOU:   Understand these instructions.  Will watch your condition.  Will get help right away if you are not doing well or get worse. Document Released: 08/05/2002 Document Revised: 02/05/2012 Document Reviewed: 06/26/2008 [facility] Patient Information 2015 [place], [place]. This information is not intended to replace advice given to you by your health care provider. Make sure you discuss any questions you have with your health care provider.

## 2015-02-02 NOTE — Patient Instructions (Signed)
Wear walking boot until seen by orthopaedic doctor. Use cane or walker for stability and avoid prolonged walking or weight on affected foot.  hydrocodone if needed for more severe pain, but this medicine can cause sedation, dizziness and increase risk of fall. Start with tylenol for pain if tolerated.   Metatarsal Fracture, Undisplaced A metatarsal fracture is a break in the bone(s) of the foot. These are the bones of the foot that connect your toes to the bones of the ankle. DIAGNOSIS  The diagnoses of these fractures are usually made with X-rays. If there are problems in the forefoot and x-rays are normal a later bone scan will usually make the diagnosis.  TREATMENT AND HOME CARE INSTRUCTIONS  Treatment may or may not include a cast or walking shoe. When casts are needed the use is usually for short periods of time so as not to slow down healing with muscle wasting (atrophy).  Activities should be stopped until further advised by your caregiver.  Wear shoes with adequate shock absorbing capabilities and stiff soles.  Alternative exercise may be undertaken while waiting for healing. These may include bicycling and swimming, or as your caregiver suggests.  It is important to keep all follow-up visits or specialty referrals. The failure to keep these appointments could result in improper bone healing and chronic pain or disability.  Warning: Do not drive a car or operate a motor vehicle until your caregiver specifically tells you it is safe to do so. IF YOU DO NOT HAVE A CAST OR SPLINT:  You may walk on your injured foot as tolerated or advised.  Do not put any weight on your injured foot for as long as directed by your caregiver. Slowly increase the amount of time you walk on the foot as the pain allows or as advised.  Use crutches until you can bear weight without pain. A gradual increase in weight bearing may help.  Apply ice to the injury for 15-20 minutes each hour while awake for the  first 2 days. Put the ice in a plastic bag and place a towel between the bag of ice and your skin.  Only take over-the-counter or prescription medicines for pain, discomfort, or fever as directed by your caregiver. SEEK IMMEDIATE MEDICAL CARE IF:   Your cast gets damaged or breaks.  You have continued severe pain or more swelling than you did before the cast was put on, or the pain is not controlled with medications.  Your skin or nails below the injury turn blue or grey, or feel cold or numb.  There is a bad smell, or new stains or pus-like (purulent) drainage coming from the cast. MAKE SURE YOU:   Understand these instructions.  Will watch your condition.  Will get help right away if you are not doing well or get worse. Document Released: 08/05/2002 Document Revised: 02/05/2012 Document Reviewed: 06/26/2008 [facility] Patient Information 2015 [place]. This information is not intended to replace advice given to you by your health care provider. Make sure you discuss any questions you have with your health care provider.

## 2015-05-08 ENCOUNTER — Ambulatory Visit (INDEPENDENT_AMBULATORY_CARE_PROVIDER_SITE_OTHER): Payer: Medicare Other | Admitting: Physician Assistant

## 2015-05-08 VITALS — BP 120/56 | HR 71 | Temp 98.2°F | Resp 17 | Ht 61.0 in | Wt 112.0 lb

## 2015-05-08 DIAGNOSIS — H6983 Other specified disorders of Eustachian tube, bilateral: Secondary | ICD-10-CM

## 2015-05-08 DIAGNOSIS — H6122 Impacted cerumen, left ear: Secondary | ICD-10-CM | POA: Diagnosis not present

## 2015-05-08 DIAGNOSIS — H9203 Otalgia, bilateral: Secondary | ICD-10-CM | POA: Diagnosis not present

## 2015-05-08 NOTE — Progress Notes (Signed)
   Subjective:    Patient ID: Danielle Clarke, female    DOB: [birth year], 74 y.o.   MRN: [id number]  HPI Patient presents with daughter for decreased hearing secondary to both ears feeling clogged. Has been ongoing for months, but has gotten worse. Feels like she is always talking loudly inside a bubble and like pressure is in ears as if flying, but can't get ears to pop. Denies pain or drainage of ears. Denies rhinorrhea, congestion, sinus pressure, tinnitus, or HA. Has tubes in both ears for over year that have not fallen out secondary to bariatric chamber treatments. Patient states that she has requested tubes to be taken out due to decreased hearing, but it was declined by Dr. Pollyann Kennedy.    Review of Systems As noted above.    Objective:   Physical Exam  Constitutional: She is oriented to person, place, and time. She appears well-developed and well-nourished. No distress.  Blood pressure 120/56, pulse 71, temperature 98.2 F (36.8 C), temperature source Oral, resp. rate 17, height 5\' 1"  (1.549 m), weight 112 lb (50.803 kg), SpO2 96 %.   HENT:  Head: Normocephalic and atraumatic.  Right Ear: There is tenderness. No drainage or swelling. A foreign body (copious cerumen) is present. Decreased hearing is noted.  Left Ear: There is tenderness. No drainage or swelling. A foreign body (cerumen impaction) is present. Decreased hearing is noted.  Eyes: Conjunctivae are normal. Right eye exhibits no discharge. Left eye exhibits no discharge. No scleral icterus.  Pulmonary/Chest: Effort normal.  Neurological: She is alert and oriented to person, place, and time.  Skin: She is not diaphoretic.  Psychiatric: She has a normal mood and affect. Her behavior is normal. Judgment and thought content normal.      Assessment & Plan:  1. Eustachian tube dysfunction, bilateral 2. Cerumen impaction, left 3. Ear pain, bilateral Irrigation failed as patient could not tolerate pressure of procedure. Would  like a ref to a different ENT for second opinion.  - Ambulatory referral to ENT    Janan Ridge PA-C  [facility] 05/08/2015 4:18 PM

## 2015-08-25 ENCOUNTER — Ambulatory Visit (INDEPENDENT_AMBULATORY_CARE_PROVIDER_SITE_OTHER): Payer: Medicare Other | Admitting: Interventional Cardiology

## 2015-08-25 VITALS — BP 136/56 | HR 70 | Ht 62.0 in | Wt 113.8 lb

## 2015-08-25 DIAGNOSIS — I10 Essential (primary) hypertension: Secondary | ICD-10-CM | POA: Diagnosis not present

## 2015-08-25 DIAGNOSIS — I350 Nonrheumatic aortic (valve) stenosis: Secondary | ICD-10-CM | POA: Diagnosis not present

## 2015-08-25 DIAGNOSIS — N182 Chronic kidney disease, stage 2 (mild): Secondary | ICD-10-CM

## 2015-08-25 DIAGNOSIS — R55 Syncope and collapse: Secondary | ICD-10-CM | POA: Diagnosis not present

## 2015-08-25 DIAGNOSIS — I739 Peripheral vascular disease, unspecified: Secondary | ICD-10-CM

## 2015-08-25 DIAGNOSIS — E785 Hyperlipidemia, unspecified: Secondary | ICD-10-CM | POA: Diagnosis not present

## 2015-08-25 DIAGNOSIS — I779 Disorder of arteries and arterioles, unspecified: Secondary | ICD-10-CM

## 2015-08-25 DIAGNOSIS — E1029 Type 1 diabetes mellitus with other diabetic kidney complication: Secondary | ICD-10-CM

## 2015-08-25 NOTE — Progress Notes (Signed)
Cardiology Office Note   Date:  08/25/2015   ID:  Danielle Clarke, DOB [birth year], MRN [id number]  PCP:  Gaye Alken, MD  Cardiologist:  Lesleigh Noe, MD   Chief Complaint  Patient presents with  . Loss of Consciousness      History of Present Illness: Danielle Clarke is a 74 y.o. female who presents for evaluation of syncope. The patient has a recent history of syncope 2. Each episode has occurred when pills have become lodged in her throat resulting in repeated flexion of the neck and deep breathing to get the pills to pass. On 2 occasions, the most recent approximately 2 months ago, she ended up on the floor and did not understand why. Prior to fainting, she always had a severe epigastric discomfort. She denies nausea and diaphoresis prior to fainting. Otherwise she has been well. She has never before had syncope prior to these episodes.   She did have a nightmare one year ago and ended up out of her bed wedge between a dresser and her bed on her head. The end result was cervical fractures that led to a stabilization procedure last fall. It is after the anterior cervical fusion that she began having the dysphagia to pills.  She has a long-standing history of a heart murmur for which I've seen her in the past, approximately 15 years ago. I have no records available from those evaluations.   Past Medical History  Diagnosis Date  . Arthritis   . Neck fracture     july 2013  . Hyperlipemia   . Hypertension     Dr. Katrinka Blazing ~ 2 years ago  . Asthmatic bronchitis     with colds per patient  . Type 1 diabetes mellitus   . Cataract   . Osteoporosis   . Glaucoma     Past Surgical History  Procedure Laterality Date  . Left ankle surgery  2001    steel plate and 3 screws   . 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 Breast surgery  1988  . Tonsillectomy and adenoidectomy  1948  . Tubal ligation  1979  . Hammer toe surgery  1998  .  Cataract extraction Bilateral 2011    right and then left  . Tympanostomy tube placement Bilateral   . Anterior cervical corpectomy N/A 07/31/2014    Procedure: Cervical Four to Cervical Six Corpectomy;  Surgeon: Karn Cassis, MD;  Location: [facility];  Service: Neurosurgery;  Laterality: N/A;  C4 to C6 Corpectomy  . Eye surgery    . Joint replacement    . Fracture surgery       Current Outpatient Prescriptions  Medication Sig Dispense Refill  . acetaminophen (TYLENOL) 500 MG tablet Take 500 mg by mouth 2 (two) times daily.     Marland Kitchen alendronate (FOSAMAX) 40 MG tablet Take 40 mg by mouth every 7 (seven) days. Take with a full glass of water on an empty stomach.    Marland Kitchen aspirin EC 81 MG tablet Take 81 mg by mouth daily.    . cholecalciferol (VITAMIN D) 1000 UNITS tablet Take 1,000 Units by mouth daily.     . Chromium Picolinate 1000 MCG TABS Take 1,000 mcg by mouth daily.     Marland Kitchen CINNAMON PO Take 1 tablet by mouth 3 (three) times daily. Dose is 1200 mg    . cyclobenzaprine (FLEXERIL) 5 MG tablet Take 5 mg by mouth 3 (  three) times daily as needed for muscle spasms (muscle spasm).     Marland Kitchen diclofenac (VOLTAREN) 75 MG EC tablet Take 75 mg by mouth daily.    . Flaxseed, Linseed, (FLAXSEED OIL) 1200 MG CAPS Take 1,200 mg by mouth 3 (three) times daily.    . Glucosamine-Chondroitin (GLUCOSAMINE CHONDR COMPLEX PO) Take 1 tablet by mouth 2 (two) times daily.    Marland Kitchen glucose blood test strip 1 each by Other route as needed for other (USE TO TEST YOUR BLOOD SUGAR UP TO 8 TIMES DAILY AS NEEDED). Use as instructed    . insulin aspart (NOVOLOG) 100 UNIT/ML injection Inject 2-6 Units into the skin See admin instructions. Sliding scale. Base= 2 units-Reading below 80 subtract 1; 80-150 take 2 units (base dose); 151-200 add 1 unit; 201-250 add 2 units; 251-300 add 3 units; 301-350 add 4 units; 351-400 add 5 units; 401-450 add 6 units    . insulin glargine (LANTUS) 100 UNIT/ML injection Inject 11 Units into the skin  every morning.     . Insulin Pen Needle (PEN NEEDLES) 31G X 8 MM MISC by Does not apply route as needed (USE TO INJECT INSULIN INTO THE SKIN AS DIRECTED TWICE DAILY).    . Magnesium 250 MG TABS Take 250 mg by mouth daily.    . Multiple Vitamin (MULTIVITAMIN WITH MINERALS) TABS Take 1 tablet by mouth every morning.    . Multiple Vitamins-Minerals (WOMENS MULTIVITAMIN PLUS PO) Take 1 tablet by mouth daily.    Bertram Gala Glycol-Propyl Glycol (SYSTANE OP) Place 1 drop into both eyes daily as needed (ITCHY EYES).    Marland Kitchen simvastatin (ZOCOR) 40 MG tablet Take 40 mg by mouth every evening.    . traMADol (ULTRAM) 50 MG tablet Take 50 mg by mouth 3 (three) times daily as needed (PAIN).    . Turmeric 500 MG CAPS Take 500 mg by mouth daily.    . VENTOLIN HFA 108 (90 BASE) MCG/ACT inhaler Inhale 1-2 puffs into the lungs every 4 (four) hours as needed for wheezing or shortness of breath.      No current facility-administered medications for this visit.    Allergies:   Cefaclor; Tetracycline; Vancomycin; Augmentin; and Prednisone    Social History:  The patient  reports that she has never smoked. She has never used smokeless tobacco. She reports that she drinks about 0.6 oz of alcohol per week. She reports that she does not use illicit drugs.   Family History:  The patient's family history includes Cancer in her mother; Heart Problems in her father.    ROS:  Please see the history of present illness.   Otherwise, review of systems are positive for back pain, muscle aches, anxiety, emotional stress related to family issues..   All other systems are reviewed and negative.    PHYSICAL EXAM: VS:  BP 136/56 mmHg  Pulse 70  Ht  (1.575 m)  Wt 51.619 kg (113 lb 12.8 oz)  BMI 20.81 kg/m2  SpO2 98% , BMI Body mass index is 20.81 kg/(m^2). GEN: Well nourished, well developed, in no acute distress HEENT: normal Neck: no JVD, carotid bruits, or masses Cardiac: RRR.  There 2 to 3/6 crescendo decrescendo  right upper sternal border to left lower sternal border systolic diamond-shaped murmur. She has no rub, or gallop. There is no edema. Respiratory:  clear to auscultation bilaterally, normal work of breathing. GI: soft, nontender, nondistended, + BS MS: no deformity or atrophy Skin: warm and dry, no rash  Neuro:  Strength and sensation are intact Psych: euthymic mood, full affect   EKG:  EKG is not ordered today. The ekg reveals a few electrocardiogram performed at [facility] physicians on 07/05/2015 demonstrated poor wave progression V1 through 4, normal sinus rhythm, interventricular conduction delay, and otherwise unremarkable.   Recent Labs: 10/07/2014: ALT 17; BUN 16; Creatinine, Ser 1.01; Hemoglobin 10.5*; Platelets 316; Potassium 5.5*; Sodium 139    Lipid Panel No results found for: CHOL, TRIG, HDL, CHOLHDL, VLDL, LDLCALC, LDLDIRECT    Wt Readings from Last 3 Encounters:  08/25/15 51.619 kg (113 lb 12.8 oz)  05/08/15 50.803 kg (112 lb)  02/02/15 51.256 kg (113 lb)      Other studies Reviewed: Additional studies/ records that were reviewed today include: [facility] records. The findings include an abnormal resting EKG with poor R-wave progression. No evaluation for syncope has occurred. A 2013 carotid Doppler demonstrated bilateral less than 50% carotid obstruction at the bifurcation    ASSESSMENT AND PLAN:  1. Aortic stenosis Uncertain severity. May have a role and recent episodes of syncope.  - Echocardiogram; Future  2. Syncope, unspecified syncope type Uncertain cause.  Rule out carotid sinus hypersensitivity or severe bilateral carotid obstructive disease. Also concerned about the possibility of newly mediated syncope associated with dysphagia (swallow syncope).  - Carotid; Future  3. Hyperlipidemia On therapy to prevent vascular disease in the setting of type 1 diabetes  4. Essential hypertension, benign Adequate control  5. Bilateral carotid artery  disease Bilateral,  right greater than left bruits, that were last evaluated 3 years ago demonstrating bilateral less than 50% obstruction.  6. CKD (chronic kidney disease) stage 2, GFR 60-89 ml/min Not addressed  7. 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  The patient has the following concerns regarding medicines: None.  The following changes/actions have been instituted:    Consider long term monitoring a 30 day event monitor or loop recorder may become necessary.   An echocardiogram will establish current structure of the heart and particularly evaluate for severity of aortic stenosis/LV outflow obstruction  Bilateral carotid Doppler study  Labs/ tests ordered today include:   Orders Placed This Encounter  Procedures  . Echocardiogram     Disposition:   FU with HS in 6 weeks  Signed, Lesleigh Noe, MD  08/25/2015 4:58 PM    [facility] [address] [place], [place], [place]  [address] Phone: [phone]; Fax: [phone]

## 2015-08-25 NOTE — Patient Instructions (Addendum)
Medication Instructions:  Your physician recommends that you continue on your current medications as directed. Please refer to the Current Medication list given to you today.   Labwork: None ordered  Testing/Procedures: Your physician has requested that you have an echocardiogram. Echocardiography is a painless test that uses sound waves to create images of your heart. It provides your doctor with information about the size and shape of your heart and how well your heart's chambers and valves are working. This procedure takes approximately one hour. There are no restrictions for this procedure.  Your physician has requested that you have a carotid duplex. This test is an ultrasound of the carotid arteries in your neck. It looks at blood flow through these arteries that supply the brain with blood. Allow one hour for this exam. There are no restrictions or special instructions.   Follow-Up: You have a follow up appointment scheduled on 10/06/15 @ 2pm  Any Other Special Instructions Will Be Listed Below (If Applicable).

## 2015-08-27 ENCOUNTER — Encounter: Payer: Medicare Other | Admitting: Interventional Cardiology

## 2015-09-08 ENCOUNTER — Ambulatory Visit (HOSPITAL_COMMUNITY)
Admission: RE | Admit: 2015-09-08 | Discharge: 2015-09-08 | Disposition: A | Discharge status: Home / Self Care | Payer: Medicare Other | Source: Ambulatory Visit | Attending: Cardiovascular Disease | Admitting: Cardiovascular Disease

## 2015-09-08 ENCOUNTER — Other Ambulatory Visit: Payer: Self-pay

## 2015-09-08 ENCOUNTER — Ambulatory Visit (HOSPITAL_BASED_OUTPATIENT_CLINIC_OR_DEPARTMENT_OTHER): Payer: Medicare Other

## 2015-09-08 ENCOUNTER — Inpatient Hospital Stay (HOSPITAL_COMMUNITY): Admission: RE | Admit: 2015-09-08 | Payer: Medicare Other | Source: Ambulatory Visit

## 2015-09-08 DIAGNOSIS — I059 Rheumatic mitral valve disease, unspecified: Secondary | ICD-10-CM | POA: Insufficient documentation

## 2015-09-08 DIAGNOSIS — E785 Hyperlipidemia, unspecified: Secondary | ICD-10-CM | POA: Diagnosis not present

## 2015-09-08 DIAGNOSIS — R55 Syncope and collapse: Secondary | ICD-10-CM | POA: Diagnosis not present

## 2015-09-08 DIAGNOSIS — I340 Nonrheumatic mitral (valve) insufficiency: Secondary | ICD-10-CM | POA: Insufficient documentation

## 2015-09-08 DIAGNOSIS — E119 Type 2 diabetes mellitus without complications: Secondary | ICD-10-CM | POA: Insufficient documentation

## 2015-09-08 DIAGNOSIS — I350 Nonrheumatic aortic (valve) stenosis: Secondary | ICD-10-CM

## 2015-09-09 ENCOUNTER — Telehealth: Payer: Self-pay

## 2015-09-09 NOTE — Telephone Encounter (Signed)
F/u ° ° ° ° ° °Pt returning Lisa's phone call. °

## 2015-09-09 NOTE — Telephone Encounter (Signed)
Called to give pt  Carotid results. lmtcb

## 2015-09-09 NOTE — Telephone Encounter (Signed)
Informed pt of echo and carotid results. Pt verbalized understanding.

## 2015-09-09 NOTE — Telephone Encounter (Signed)
-----   Message from Lyn RecordsHenry W Smith, MD sent at 09/09/2015  1:52 PM EDT ----- Stable. Repeat in 1 year

## 2015-10-06 ENCOUNTER — Ambulatory Visit (INDEPENDENT_AMBULATORY_CARE_PROVIDER_SITE_OTHER): Payer: Medicare Other | Admitting: Interventional Cardiology

## 2015-10-06 ENCOUNTER — Encounter: Payer: Self-pay | Admitting: Interventional Cardiology

## 2015-10-06 VITALS — BP 128/50 | HR 71 | Ht 62.0 in | Wt 112.0 lb

## 2015-10-06 DIAGNOSIS — I779 Disorder of arteries and arterioles, unspecified: Secondary | ICD-10-CM | POA: Diagnosis not present

## 2015-10-06 DIAGNOSIS — I350 Nonrheumatic aortic (valve) stenosis: Secondary | ICD-10-CM | POA: Diagnosis not present

## 2015-10-06 DIAGNOSIS — N182 Chronic kidney disease, stage 2 (mild): Secondary | ICD-10-CM

## 2015-10-06 DIAGNOSIS — R55 Syncope and collapse: Secondary | ICD-10-CM

## 2015-10-06 DIAGNOSIS — I739 Peripheral vascular disease, unspecified: Secondary | ICD-10-CM

## 2015-10-06 NOTE — Progress Notes (Signed)
Cardiology Office Note   Date:  10/06/2015   ID:  Danielle Clarke, DOB [birth year], MRN [id number]  PCP:  Gaye Alken, MD  Cardiologist:  Lesleigh Noe, MD   Chief Complaint  Patient presents with  . Heart Murmur      History of Present Illness: Danielle ROSELLO is a 74 y.o. female who presents for aortic stenosis, recurring episodes of syncope, bilateral carotid disease, and essential hypertension.   She is doing well. She has had no recurrence of syncope. She is not having as much dysphagia with swallowing. Syncope was always associated with dysphagia , followed by pain, followed by fainting. No fainting episode in the past 3 months.    Past Medical History  Diagnosis Date  . Arthritis   . Neck fracture [facility])     july 2013  . Hyperlipemia   . Hypertension     Dr. Katrinka Blazing ~ 2 years ago  . Asthmatic bronchitis     with colds per patient  . Type 1 diabetes mellitus (HCC)   . Cataract   . Osteoporosis   . Glaucoma     Past Surgical History  Procedure Laterality Date  . Left ankle surgery  2001    steel plate and 3 screws   . 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 Breast surgery  1988  . Tonsillectomy and adenoidectomy  1948  . Tubal ligation  1979  . Hammer toe surgery  1998  . Cataract extraction Bilateral 2011    right and then left  . Tympanostomy tube placement Bilateral   . Anterior cervical corpectomy N/A 07/31/2014    Procedure: Cervical Four to Cervical Six Corpectomy;  Surgeon: Karn Cassis, MD;  Location: [facility];  Service: Neurosurgery;  Laterality: N/A;  C4 to C6 Corpectomy  . Eye surgery    . Joint replacement    . Fracture surgery       Current Outpatient Prescriptions  Medication Sig Dispense Refill  . acetaminophen (TYLENOL) 500 MG tablet Take 500 mg by mouth 2 (two) times daily.     Marland Kitchen alendronate (FOSAMAX) 40 MG tablet Take 40 mg by mouth every 7 (seven) days. Take with a full glass of water on  an empty stomach.    Marland Kitchen aspirin EC 81 MG tablet Take 81 mg by mouth daily.    Marland Kitchen CALCIUM-VITAMIN D PO Take 1 tablet by mouth daily.    . cholecalciferol (VITAMIN D) 1000 UNITS tablet Take 1,000 Units by mouth daily.     . Chromium Picolinate 1000 MCG TABS Take 1,000 mcg by mouth daily.     Marland Kitchen CINNAMON PO Take 1 tablet by mouth 3 (three) times daily. Dose is 1200 mg    . cyclobenzaprine (FLEXERIL) 5 MG tablet Take 5 mg by mouth 3 (three) times daily as needed for muscle spasms (muscle spasm).     Marland Kitchen diclofenac (VOLTAREN) 75 MG EC tablet Take 75 mg by mouth daily.    . Flaxseed, Linseed, (FLAXSEED OIL) 1200 MG CAPS Take 1,200 mg by mouth 2 (two) times daily.    Marland Kitchen glucose blood test strip 1 each by Other route as needed for other (USE TO TEST YOUR BLOOD SUGAR UP TO 8 TIMES DAILY AS NEEDED). Use as instructed    . insulin aspart (NOVOLOG) 100 UNIT/ML injection Inject 2-6 Units into the skin See admin instructions. Sliding scale. Base= 2 units-Reading below 80 subtract 1; 80-150 take  2 units (base dose); 151-200 add 1 unit; 201-250 add 2 units; 251-300 add 3 units; 301-350 add 4 units; 351-400 add 5 units; 401-450 add 6 units    . insulin glargine (LANTUS) 100 UNIT/ML injection Inject 11 Units into the skin every morning.     . Insulin Pen Needle (PEN NEEDLES) 31G X 8 MM MISC by Does not apply route as needed (USE TO INJECT INSULIN INTO THE SKIN AS DIRECTED TWICE DAILY).    . Magnesium 250 MG TABS Take 250 mg by mouth daily.    . Multiple Vitamins-Minerals (WOMENS MULTIVITAMIN PLUS PO) Take 1 tablet by mouth daily.    Bertram Gala. Polyethyl Glycol-Propyl Glycol (SYSTANE OP) Place 1 drop into both eyes daily as needed (ITCHY EYES).    Marland Kitchen. simvastatin (ZOCOR) 40 MG tablet Take 40 mg by mouth every evening.    . traMADol (ULTRAM) 50 MG tablet Take 50 mg by mouth 3 (three) times daily as needed (PAIN).    . Turmeric 500 MG CAPS Take 500 mg by mouth daily.    . VENTOLIN HFA 108 (90 BASE) MCG/ACT inhaler Inhale 1-2 puffs  into the lungs every 4 (four) hours as needed for wheezing or shortness of breath.      No current facility-administered medications for this visit.    Allergies:   Cefaclor; Tetracycline; Vancomycin; Augmentin; and Prednisone    Social History:  The patient  reports that she has never smoked. She has never used smokeless tobacco. She reports that she drinks about 0.6 oz of alcohol per week. She reports that she does not use illicit drugs.   Family History:  The patient's family history includes Cancer in her mother; Heart Problems in her father.    ROS:  Please see the history of present illness.   Otherwise, review of systems are positive for none.   All other systems are reviewed and negative.    PHYSICAL EXAM: VS:  BP 128/50 mmHg  Pulse 71  Ht 5\' 2"  (1.575 m)  Wt 50.803 kg (112 lb)  BMI 20.48 kg/m2  SpO2 96% , BMI Body mass index is 20.48 kg/(m^2). GEN: Well nourished, well developed, in no acute distress HEENT: normal Neck: no JVD, carotid bruits, or masses Cardiac: RRR.  There is no murmur, rub, or gallop. There is no edema. Respiratory:  clear to auscultation bilaterally, normal work of breathing. GI: soft, nontender, nondistended, + BS MS: no deformity or atrophy Skin: warm and dry, no rash Neuro:  Strength and sensation are intact Psych: euthymic mood, full affect   EKG:  EKG is not ordered today.   Recent Labs: 10/07/2014: ALT 17; BUN 16; Creatinine, Ser 1.01; Hemoglobin 10.5*; Platelets 316; Potassium 5.5*; Sodium 139    Lipid Panel No results found for: CHOL, TRIG, HDL, CHOLHDL, VLDL, LDLCALC, LDLDIRECT    Wt Readings from Last 3 Encounters:  10/06/15 50.803 kg (112 lb)  08/25/15 51.619 kg (113 lb 12.8 oz)  05/08/15 50.803 kg (112 lb)      Other studies Reviewed: Additional studies/ records that were reviewed today include: none. The findings include none.    ASSESSMENT AND PLAN:  1. Syncope, unspecified syncope type No recurrence. Felt to be  related to swallow syncope.  2. Aortic stenosis Mild by recent echo  3. Bilateral carotid artery disease (HCC) Moderate by recent bilateral carotid Doppler study  4. CKD (chronic kidney disease) stage 2, GFR 60-89 ml/min Stable  5. Pulmonary hypertension, uncertain etiology Likely secondary and requires no further  follow-up at this time.  Current medicines are reviewed at length with the patient today.  The patient has the following concerns regarding medicines: none.  The following changes/actions have been instituted:    We discussed the workup for recurrent syncope. Bilateral carotid bruits led to the diagnosis of moderate bilateral carotid obstruction on carotid ultrasound. This will be followed up in one year.  Mild aortic stenosis on echo requires no particular follow-up currently but will likely need to be repeated in several years.   I have decreased sided against continuous ambulatory monitoring for the time being and comment given the history of swallow related syncope.   Labs/ tests ordered today include:  No orders of the defined types were placed in this encounter.     Disposition:   FU with HS in 1 year  Signed, Lesleigh Noe, MD  10/06/2015 1:51 PM    [facility] [address] [place], [place], [place]  [address] Phone: [phone]; Fax: [phone]

## 2015-10-06 NOTE — Patient Instructions (Signed)
Medication Instructions:  Your physician recommends that you continue on your current medications as directed. Please refer to the Current Medication list given to you today.   Labwork: None ordered  Testing/Procedures: Your physician has requested that you have a carotid duplex. This test is an ultrasound of the carotid arteries in your neck. It looks at blood flow through these arteries that supply the brain with blood. Allow one hour for this exam. There are no restrictions or special instructions. (To be scheduled in October 2017)  Follow-Up: Your physician wants you to follow-up in: 1 year with Dr.Smith You will receive a reminder letter in the mail two months in advance. If you don't receive a letter, please call our office to schedule the follow-up appointment.   Any Other Special Instructions Will Be Listed Below (If Applicable).     If you need a refill on your cardiac medications before your next appointment, please call your pharmacy.

## 2015-12-21 ENCOUNTER — Telehealth: Payer: Self-pay | Admitting: Interventional Cardiology

## 2015-12-21 NOTE — Telephone Encounter (Signed)
Walk in pt form-Dept of Transportation paper-dropped off gave to [employer]

## 2015-12-28 ENCOUNTER — Telehealth: Payer: Self-pay | Admitting: Interventional Cardiology

## 2015-12-28 NOTE — Telephone Encounter (Signed)
Department of Transportation paper signed and completed patient aware for pick up.

## 2015-12-28 NOTE — Telephone Encounter (Signed)
New problem    Pt need to know status of DMV form that she dropped off 1.23.17. Please advise.

## 2015-12-28 NOTE — Telephone Encounter (Signed)
Department of Transportation papers picked up.

## 2016-02-29 ENCOUNTER — Other Ambulatory Visit: Payer: Self-pay

## 2016-02-29 DIAGNOSIS — Z1231 Encounter for screening mammogram for malignant neoplasm of breast: Secondary | ICD-10-CM

## 2016-03-17 ENCOUNTER — Ambulatory Visit
Admission: RE | Admit: 2016-03-17 | Discharge: 2016-03-17 | Disposition: A | Discharge status: Home / Self Care | Payer: Medicare Other | Source: Ambulatory Visit

## 2016-03-17 DIAGNOSIS — Z1231 Encounter for screening mammogram for malignant neoplasm of breast: Secondary | ICD-10-CM

## 2016-05-11 ENCOUNTER — Ambulatory Visit: Payer: Medicare Other | Admitting: Podiatry

## 2016-09-15 ENCOUNTER — Ambulatory Visit (HOSPITAL_COMMUNITY)
Admission: RE | Admit: 2016-09-15 | Discharge: 2016-09-15 | Disposition: A | Discharge status: Home / Self Care | Payer: Medicare Other | Source: Ambulatory Visit | Attending: Interventional Cardiology | Admitting: Interventional Cardiology

## 2016-09-15 DIAGNOSIS — E119 Type 2 diabetes mellitus without complications: Secondary | ICD-10-CM | POA: Insufficient documentation

## 2016-09-15 DIAGNOSIS — I739 Peripheral vascular disease, unspecified: Secondary | ICD-10-CM

## 2016-09-15 DIAGNOSIS — I779 Disorder of arteries and arterioles, unspecified: Secondary | ICD-10-CM

## 2016-09-15 DIAGNOSIS — I6523 Occlusion and stenosis of bilateral carotid arteries: Secondary | ICD-10-CM | POA: Insufficient documentation

## 2016-09-15 DIAGNOSIS — R938 Abnormal findings on diagnostic imaging of other specified body structures: Secondary | ICD-10-CM | POA: Diagnosis not present

## 2016-09-15 DIAGNOSIS — I10 Essential (primary) hypertension: Secondary | ICD-10-CM | POA: Diagnosis not present

## 2016-09-15 DIAGNOSIS — E785 Hyperlipidemia, unspecified: Secondary | ICD-10-CM | POA: Diagnosis not present

## 2016-09-18 ENCOUNTER — Telehealth: Payer: Self-pay | Admitting: Interventional Cardiology

## 2016-09-18 NOTE — Telephone Encounter (Signed)
Informed pt of results.  Pt advised I have forwarded results to PCP and contact them in regards to following up on thyroid nodule.  Pt verbalized understanding and was in agreement with this plan.

## 2016-09-18 NOTE — Telephone Encounter (Signed)
Pt is returning your phone call  

## 2016-09-21 ENCOUNTER — Encounter: Payer: Self-pay | Admitting: Cardiology

## 2016-09-21 ENCOUNTER — Other Ambulatory Visit: Payer: Self-pay | Admitting: Family Medicine

## 2016-09-21 DIAGNOSIS — E0789 Other specified disorders of thyroid: Secondary | ICD-10-CM

## 2016-09-27 ENCOUNTER — Ambulatory Visit
Admission: RE | Admit: 2016-09-27 | Discharge: 2016-09-27 | Disposition: A | Discharge status: Home / Self Care | Payer: Medicare Other | Source: Ambulatory Visit | Attending: Family Medicine | Admitting: Family Medicine

## 2016-09-27 DIAGNOSIS — E0789 Other specified disorders of thyroid: Secondary | ICD-10-CM

## 2016-10-01 ENCOUNTER — Encounter: Payer: Self-pay | Admitting: Interventional Cardiology

## 2016-10-06 ENCOUNTER — Encounter: Payer: Self-pay | Admitting: Cardiology

## 2016-10-06 ENCOUNTER — Encounter (INDEPENDENT_AMBULATORY_CARE_PROVIDER_SITE_OTHER): Payer: Self-pay

## 2016-10-06 ENCOUNTER — Ambulatory Visit (INDEPENDENT_AMBULATORY_CARE_PROVIDER_SITE_OTHER): Payer: Medicare Other | Admitting: Cardiology

## 2016-10-06 ENCOUNTER — Telehealth: Payer: Self-pay | Admitting: *Deleted

## 2016-10-06 VITALS — BP 140/50 | HR 77 | Ht 61.0 in | Wt 114.8 lb

## 2016-10-06 DIAGNOSIS — R55 Syncope and collapse: Secondary | ICD-10-CM

## 2016-10-06 DIAGNOSIS — I10 Essential (primary) hypertension: Secondary | ICD-10-CM

## 2016-10-06 DIAGNOSIS — I350 Nonrheumatic aortic (valve) stenosis: Secondary | ICD-10-CM

## 2016-10-06 DIAGNOSIS — I779 Disorder of arteries and arterioles, unspecified: Secondary | ICD-10-CM | POA: Diagnosis not present

## 2016-10-06 DIAGNOSIS — E782 Mixed hyperlipidemia: Secondary | ICD-10-CM

## 2016-10-06 DIAGNOSIS — I739 Peripheral vascular disease, unspecified: Secondary | ICD-10-CM

## 2016-10-06 NOTE — Patient Instructions (Signed)
Medication Instructions:  Your physician recommends that you continue on your current medications as directed. Please refer to the Current Medication list given to you today.   Labwork: -None  Testing/Procedures: Your physician has requested that you have a carotid duplex. This test is an ultrasound of the carotid arteries in your neck. It looks at blood flow through these arteries that supply the brain with blood. Allow one hour for this exam. There are no restrictions or special instructions.     Follow-Up: Your physician wants you to follow-up in: 1 year with Dr. [name].  You will receive a reminder letter in the mail two months in advance. If you don't receive a letter, please call our office to schedule the follow-up appointment.  Any Other Special Instructions Will Be Listed Below (If Applicable). Our office will call Dr. Clarene DukeLittle to get recent labs.      If you need a refill on your cardiac medications before your next appointment, please call your pharmacy.

## 2016-10-06 NOTE — Progress Notes (Signed)
Cardiology Office Note   Date:  10/06/2016   ID:  Danielle Clarke, DOB [birth year], MRN [id number]  PCP:  Gaye Alken, MD  Cardiologist:  Dr. Katrinka Blazing    Chief Complaint  Patient presents with  . PAD    with carotid disease. Thyroid biopsy ruled negative      History of Present Illness: Danielle Clarke is a 76 y.o. female who presents for carotid disease and aortic stenosis.    She has a hx of aortic stenosis, recurring episodes of syncope, bilateral carotid disease, and essential hypertension.  Recent carotid dopplers stable 40-59% bilateral ICA stenosis. Study needs to be repeated in 1 year. Coincidentally, a thyroid nodule is identified-she was seen by PCP and thyroid ultrasound was done it was felt to be benign nodule and no further testing needed.    She has no chest pain and no further syncope.  No SOB.  She is tolerating meds without problems.    Past Medical History:  Diagnosis Date  . Arthritis   . Asthmatic bronchitis    with colds per patient  . Cataract   . Glaucoma   . Hyperlipemia   . Hypertension    Dr. Katrinka Blazing ~ 2 years ago  . Neck fracture [facility])    july 2013  . Osteoporosis   . Type 1 diabetes mellitus (HCC)     Past Surgical History:  Procedure Laterality Date  . ANTERIOR CERVICAL CORPECTOMY N/A 07/31/2014   Procedure: Cervical Four to Cervical Six Corpectomy;  Surgeon: Karn Cassis, MD;  Location: [facility];  Service: Neurosurgery;  Laterality: N/A;  C4 to C6 Corpectomy  . BREAST SURGERY  1988  . CATARACT EXTRACTION Bilateral 2011   right and then left  . EYE SURGERY    . FRACTURE SURGERY    . HAMMER TOE SURGERY  1998  . JOINT REPLACEMENT    . Left ankle surgery  2001   steel plate and 3 screws   . TONSILLECTOMY AND ADENOIDECTOMY  1948  . TOTAL SHOULDER REPLACEMENT  2010   right shoulder   . TUBAL LIGATION  1979  . TYMPANOSTOMY TUBE PLACEMENT Bilateral      Current Outpatient Prescriptions  Medication Sig  Dispense Refill  . acetaminophen (TYLENOL) 500 MG tablet Take 500 mg by mouth 2 (two) times daily.     Marland Kitchen aspirin EC 81 MG tablet Take 81 mg by mouth daily.    Marland Kitchen CALCIUM-VITAMIN D PO Take 1 tablet by mouth daily.    . cholecalciferol (VITAMIN D) 1000 UNITS tablet Take 1,000 Units by mouth daily.     . Chromium Picolinate 1000 MCG TABS Take 1,000 mcg by mouth daily.     Marland Kitchen CINNAMON PO Take 1 tablet by mouth 3 (three) times daily. Dose is 1200 mg    . cyclobenzaprine (FLEXERIL) 5 MG tablet Take 5 mg by mouth 3 (three) times daily as needed for muscle spasms (muscle spasm).     Marland Kitchen diclofenac (VOLTAREN) 75 MG EC tablet Take 75 mg by mouth daily.    . Flaxseed, Linseed, (FLAXSEED OIL) 1200 MG CAPS Take 1,200 mg by mouth 2 (two) times daily.    Marland Kitchen glucose blood test strip 1 each by Other route as needed for other (USE TO TEST YOUR BLOOD SUGAR UP TO 8 TIMES DAILY AS NEEDED). Use as instructed    . insulin aspart (NOVOLOG) 100 UNIT/ML injection Inject 2-6 Units into the skin See admin instructions. Sliding scale. Base= 2  units-Reading below 80 subtract 1; 80-150 take 2 units (base dose); 151-200 add 1 unit; 201-250 add 2 units; 251-300 add 3 units; 301-350 add 4 units; 351-400 add 5 units; 401-450 add 6 units    . insulin glargine (LANTUS) 100 UNIT/ML injection Inject 11 Units into the skin every morning.     . Insulin Pen Needle (PEN NEEDLES) 31G X 8 MM MISC by Does not apply route as needed (USE TO INJECT INSULIN INTO THE SKIN AS DIRECTED TWICE DAILY).    . Magnesium 250 MG TABS Take 250 mg by mouth daily.    . Multiple Vitamins-Minerals (WOMENS MULTIVITAMIN PLUS PO) Take 1 tablet by mouth daily.    . Polyethyl Glycol-Propyl Glycol (SBertram GalaYSTANE OP) Place 1 drop into both eyes daily as needed (ITCHY EYES).    Marland Kitchen. simvastatin (ZOCOR) 40 MG tablet Take 40 mg by mouth every evening.    . traMADol (ULTRAM) 50 MG tablet Take 50 mg by mouth 3 (three) times daily as needed (PAIN).    . Turmeric 500 MG CAPS Take 500 mg by  mouth daily.    . VENTOLIN HFA 108 (90 BASE) MCG/ACT inhaler Inhale 1-2 puffs into the lungs every 4 (four) hours as needed for wheezing or shortness of breath.      No current facility-administered medications for this visit.   NOT ON LANTUS INSULIN BUT Basaglar  Allergies:   Cefaclor; Tetracycline; Vancomycin; Augmentin [amoxicillin-pot clavulanate]; and Prednisone    Social History:  The patient  reports that she has never smoked. She has never used smokeless tobacco. She reports that she drinks about 0.6 oz of alcohol per week . She reports that she uses drugs, including Mescaline.   Family History:  The patient's family history includes Cancer in her mother; Heart Problems in her father.    ROS:  General:no colds or fevers, no weight changes Skin:no rashes or ulcers HEENT:no blurred vision, no congestion CV:see HPI PUL:see HPI GI:no diarrhea constipation or melena, no indigestion GU:no hematuria, no dysuria MS:no joint pain, no claudication Neuro:no syncope, no lightheadedness Endo: + type I  diabetes, no thyroid disease  Wt Readings from Last 3 Encounters:  10/06/16 114 lb 12 oz (52.1 kg)  10/06/15 112 lb (50.8 kg)  08/25/15 113 lb 12.8 oz (51.6 kg)     PHYSICAL EXAM: VS:  BP (!) 140/50   Pulse 77   Ht 5\' 1"  (1.549 m)   Wt 114 lb 12 oz (52.1 kg)   BMI 21.68 kg/m  , BMI Body mass index is 21.68 kg/m. General:Pleasant affect, NAD Skin:Warm and dry, brisk capillary refill HEENT:normocephalic, sclera clear, mucus membranes moist Neck:supple, no JVD, no bruits  Heart:S1S2 RRR without murmur, gallup, rub or click Lungs:clear without rales, rhonchi, or wheezes ZOX:WRUEAbd:soft, non tender, + BS, do not palpate liver spleen or masses Ext:no lower ext edema, 2+ pedal pulses, 2+ radial pulses Neuro:alert and oriented, MAE, follows commands, + facial symmetry    EKG:  EKG is ordered today. The ekg ordered today demonstrates SR Q waves in V1-3    Recent Labs: No results  found for requested labs within last 8760 hours.    Lipid Panel No results found for: CHOL, TRIG, HDL, CHOLHDL, VLDL, LDLCALC, LDLDIRECT     Other studies Reviewed: Additional studies/ records that were reviewed today include: . 09/15/16 Carotid dopplers Stable 40-59% bilateral ICA stenosis. Study needs to be repeated in one year. Coincidentally, a thyroid nodule is identified and needs to be followed up by  primary care physician, Dr.[name].   ECHO 08/2015 Study Conclusions  - Left ventricle: The cavity size was normal. Wall thickness was   normal. Systolic function was normal. The estimated ejection   fraction was in the range of 50% to 55%. Wall motion was normal;   there were no regional wall motion abnormalities. Doppler   parameters are consistent with abnormal left ventricular   relaxation (grade 1 diastolic dysfunction). - Aortic valve: There was mild stenosis. Valve area (VTI): 1.18   cm^2. Valve area (Vmean): 1.07 cm^2. - Mitral valve: Moderately calcified annulus. Mildly thickened,   mildly calcified leaflets . There was mild to moderate   regurgitation. - Pulmonary arteries: Systolic pressure was moderately increased.   PA peak pressure: 49 mm Hg (S).  ASSESSMENT AND PLAN:   1. Syncope, unspecified syncope type No recurrence. Felt to be related to swallow syncope.  2. Aortic stenosis Mild by recent echo 2016 follow up with Dr. [name] in 2018.   3. Bilateral carotid artery disease (HCC) Moderate by recent bilateral carotid Doppler study recheck next year.    4. CKD (chronic kidney disease) stage 2, GFR 60-89 ml/min Stable  5. Pulmonary hypertension, uncertain etiology Likely secondary and requires no further follow-up at this time.  6. Thyroid nodule benign.   Current medicines are reviewed with the patient today.  The patient Has no concerns regarding medicines.  The following changes have been made:  See above Labs/ tests ordered today  include:see above  Disposition:   FU:  see above  Signed, Nada BoozerLaura Ingold, NP  10/06/2016 4:22 PM    [facility] [address], [place], [place]  [address]/ [address] [place], [place] Phone: [phone]; Fax: [phone]  [phone]

## 2016-10-06 NOTE — Telephone Encounter (Signed)
Will fax last lipid panel and last labs to office.

## 2017-01-04 ENCOUNTER — Observation Stay (HOSPITAL_COMMUNITY)
Admission: EM | Admit: 2017-01-04 | Discharge: 2017-01-07 | Disposition: A | Discharge status: Home / Self Care | Payer: Medicare Other | Attending: Internal Medicine | Admitting: Internal Medicine

## 2017-01-04 ENCOUNTER — Emergency Department (HOSPITAL_COMMUNITY): Payer: Medicare Other

## 2017-01-04 ENCOUNTER — Encounter (HOSPITAL_COMMUNITY): Payer: Self-pay | Admitting: Emergency Medicine

## 2017-01-04 DIAGNOSIS — N182 Chronic kidney disease, stage 2 (mild): Secondary | ICD-10-CM | POA: Diagnosis not present

## 2017-01-04 DIAGNOSIS — Z880 Allergy status to penicillin: Secondary | ICD-10-CM | POA: Diagnosis not present

## 2017-01-04 DIAGNOSIS — E1151 Type 2 diabetes mellitus with diabetic peripheral angiopathy without gangrene: Secondary | ICD-10-CM | POA: Insufficient documentation

## 2017-01-04 DIAGNOSIS — M810 Age-related osteoporosis without current pathological fracture: Secondary | ICD-10-CM | POA: Insufficient documentation

## 2017-01-04 DIAGNOSIS — R4020 Unspecified coma: Secondary | ICD-10-CM | POA: Diagnosis present

## 2017-01-04 DIAGNOSIS — K5900 Constipation, unspecified: Secondary | ICD-10-CM | POA: Diagnosis not present

## 2017-01-04 DIAGNOSIS — Z9889 Other specified postprocedural states: Secondary | ICD-10-CM | POA: Diagnosis not present

## 2017-01-04 DIAGNOSIS — M419 Scoliosis, unspecified: Secondary | ICD-10-CM | POA: Diagnosis not present

## 2017-01-04 DIAGNOSIS — I129 Hypertensive chronic kidney disease with stage 1 through stage 4 chronic kidney disease, or unspecified chronic kidney disease: Secondary | ICD-10-CM | POA: Diagnosis not present

## 2017-01-04 DIAGNOSIS — I083 Combined rheumatic disorders of mitral, aortic and tricuspid valves: Secondary | ICD-10-CM | POA: Diagnosis not present

## 2017-01-04 DIAGNOSIS — E785 Hyperlipidemia, unspecified: Secondary | ICD-10-CM | POA: Diagnosis present

## 2017-01-04 DIAGNOSIS — Z881 Allergy status to other antibiotic agents status: Secondary | ICD-10-CM | POA: Diagnosis not present

## 2017-01-04 DIAGNOSIS — I10 Essential (primary) hypertension: Secondary | ICD-10-CM | POA: Diagnosis present

## 2017-01-04 DIAGNOSIS — R110 Nausea: Secondary | ICD-10-CM

## 2017-01-04 DIAGNOSIS — Z794 Long term (current) use of insulin: Secondary | ICD-10-CM | POA: Insufficient documentation

## 2017-01-04 DIAGNOSIS — R824 Acetonuria: Secondary | ICD-10-CM

## 2017-01-04 DIAGNOSIS — R55 Syncope and collapse: Principal | ICD-10-CM | POA: Insufficient documentation

## 2017-01-04 DIAGNOSIS — J45909 Unspecified asthma, uncomplicated: Secondary | ICD-10-CM | POA: Diagnosis not present

## 2017-01-04 DIAGNOSIS — E109 Type 1 diabetes mellitus without complications: Secondary | ICD-10-CM | POA: Diagnosis present

## 2017-01-04 DIAGNOSIS — H409 Unspecified glaucoma: Secondary | ICD-10-CM | POA: Insufficient documentation

## 2017-01-04 DIAGNOSIS — I779 Disorder of arteries and arterioles, unspecified: Secondary | ICD-10-CM | POA: Diagnosis present

## 2017-01-04 DIAGNOSIS — E1143 Type 2 diabetes mellitus with diabetic autonomic (poly)neuropathy: Secondary | ICD-10-CM | POA: Insufficient documentation

## 2017-01-04 DIAGNOSIS — S2232XA Fracture of one rib, left side, initial encounter for closed fracture: Secondary | ICD-10-CM | POA: Diagnosis not present

## 2017-01-04 DIAGNOSIS — Z7982 Long term (current) use of aspirin: Secondary | ICD-10-CM | POA: Diagnosis not present

## 2017-01-04 DIAGNOSIS — I272 Pulmonary hypertension, unspecified: Secondary | ICD-10-CM | POA: Insufficient documentation

## 2017-01-04 DIAGNOSIS — R112 Nausea with vomiting, unspecified: Secondary | ICD-10-CM | POA: Diagnosis not present

## 2017-01-04 DIAGNOSIS — R1110 Vomiting, unspecified: Secondary | ICD-10-CM

## 2017-01-04 DIAGNOSIS — I6523 Occlusion and stenosis of bilateral carotid arteries: Secondary | ICD-10-CM | POA: Insufficient documentation

## 2017-01-04 DIAGNOSIS — W19XXXA Unspecified fall, initial encounter: Secondary | ICD-10-CM | POA: Diagnosis not present

## 2017-01-04 DIAGNOSIS — I700 Atherosclerosis of aorta: Secondary | ICD-10-CM | POA: Diagnosis not present

## 2017-01-04 DIAGNOSIS — E1122 Type 2 diabetes mellitus with diabetic chronic kidney disease: Secondary | ICD-10-CM | POA: Diagnosis not present

## 2017-01-04 DIAGNOSIS — Z888 Allergy status to other drugs, medicaments and biological substances status: Secondary | ICD-10-CM | POA: Diagnosis not present

## 2017-01-04 DIAGNOSIS — I350 Nonrheumatic aortic (valve) stenosis: Secondary | ICD-10-CM

## 2017-01-04 DIAGNOSIS — Z96611 Presence of right artificial shoulder joint: Secondary | ICD-10-CM | POA: Insufficient documentation

## 2017-01-04 DIAGNOSIS — I739 Peripheral vascular disease, unspecified: Secondary | ICD-10-CM

## 2017-01-04 LAB — TROPONIN I: Troponin I: <0.03 ng/mL (ref <0.03)

## 2017-01-04 LAB — BASIC METABOLIC PANEL
Anion gap: 10 (ref 5–15)
BUN: 28 mg/dL — ABNORMAL HIGH (ref 6–20)
CO2: 26 mmol/L (ref 22–32)
Calcium: 9.3 mg/dL (ref 8.9–10.3)
Chloride: 101 mmol/L (ref 101–111)
Creatinine, Ser: 1 mg/dL (ref 0.44–1.00)
GFR calc Af Amer: >60 mL/min (ref >60)
GFR calc non Af Amer: 54 mL/min — ABNORMAL LOW (ref >60)
Glucose, Bld: 218 mg/dL — ABNORMAL HIGH (ref 65–99)
Potassium: 4.9 mmol/L (ref 3.5–5.1)
Sodium: 137 mmol/L (ref 135–145)

## 2017-01-04 LAB — URINALYSIS, ROUTINE W REFLEX MICROSCOPIC
Bilirubin Urine: NEGATIVE
Bilirubin Urine: NEGATIVE
Glucose, UA: 100 mg/dL — AB
Glucose, UA: NEGATIVE mg/dL
Hgb urine dipstick: NEGATIVE
Hgb urine dipstick: NEGATIVE
Ketones, ur: 15 mg/dL — AB
Ketones, ur: NEGATIVE mg/dL
Leukocytes, UA: NEGATIVE
Nitrite: NEGATIVE
Nitrite: NEGATIVE
Protein, ur: NEGATIVE mg/dL
Protein, ur: NEGATIVE mg/dL
Specific Gravity, Urine: 1.02 (ref 1.005–1.030)
Specific Gravity, Urine: 1.01 (ref 1.005–1.030)
pH: 6 (ref 5.0–8.0)
pH: 7 (ref 5.0–8.0)

## 2017-01-04 LAB — CBC
HCT: 34.7 % — ABNORMAL LOW (ref 36.0–46.0)
HCT: 33.2 % — ABNORMAL LOW (ref 36.0–46.0)
Hemoglobin: 11.2 g/dL — ABNORMAL LOW (ref 12.0–15.0)
Hemoglobin: 10.6 g/dL — ABNORMAL LOW (ref 12.0–15.0)
MCH: 29.6 pg (ref 26.0–34.0)
MCH: 29.2 pg (ref 26.0–34.0)
MCHC: 32.3 g/dL (ref 30.0–36.0)
MCHC: 31.9 g/dL (ref 30.0–36.0)
MCV: 91.6 fL (ref 78.0–100.0)
MCV: 91.5 fL (ref 78.0–100.0)
Platelets: 302 10*3/uL (ref 150–400)
Platelets: 285 10*3/uL (ref 150–400)
RBC: 3.79 MIL/uL — ABNORMAL LOW (ref 3.87–5.11)
RBC: 3.63 MIL/uL — ABNORMAL LOW (ref 3.87–5.11)
RDW: 14.3 % (ref 11.5–15.5)
RDW: 14.2 % (ref 11.5–15.5)
WBC: 9.2 10*3/uL (ref 4.0–10.5)
WBC: 7.6 10*3/uL (ref 4.0–10.5)

## 2017-01-04 LAB — URINALYSIS, MICROSCOPIC (REFLEX): Bacteria, UA: NONE SEEN

## 2017-01-04 LAB — CREATININE, SERUM
Creatinine, Ser: 0.91 mg/dL (ref 0.44–1.00)
GFR calc Af Amer: >60 mL/min (ref >60)
GFR calc non Af Amer: >60 mL/min (ref >60)

## 2017-01-04 LAB — TSH: TSH: 1.435 u[IU]/mL (ref 0.350–4.500)

## 2017-01-04 LAB — HEPATIC FUNCTION PANEL
ALT: 19 U/L (ref 14–54)
AST: 21 U/L (ref 15–41)
Albumin: 3.7 g/dL (ref 3.5–5.0)
Alkaline Phosphatase: 62 U/L (ref 38–126)
Bilirubin, Direct: <0.1 mg/dL — ABNORMAL LOW (ref 0.1–0.5)
Total Bilirubin: 0.3 mg/dL (ref 0.3–1.2)
Total Protein: 6.2 g/dL — ABNORMAL LOW (ref 6.5–8.1)

## 2017-01-04 LAB — CBG MONITORING, ED: Glucose-Capillary: 219 mg/dL — ABNORMAL HIGH (ref 65–99)

## 2017-01-04 LAB — GLUCOSE, CAPILLARY: Glucose-Capillary: 125 mg/dL — ABNORMAL HIGH (ref 65–99)

## 2017-01-04 LAB — I-STAT CG4 LACTIC ACID, ED
Lactic Acid, Venous: 0.93 mmol/L (ref 0.5–1.9)
Lactic Acid, Venous: 0.98 mmol/L (ref 0.5–1.9)

## 2017-01-04 LAB — MAGNESIUM: Magnesium: 2 mg/dL (ref 1.7–2.4)

## 2017-01-04 MED ORDER — ACETAMINOPHEN 325 MG PO TABS: 650.0000 mg | ORAL_TABLET | Freq: Four times a day (QID) | ORAL | Status: DC | PRN

## 2017-01-04 MED ORDER — ASPIRIN EC 81 MG PO TBEC
81.0000 mg | DELAYED_RELEASE_TABLET | Freq: Every day | ORAL | Status: DC
  Administered 2017-01-04 – 2017-01-07 (×4): 81 mg via ORAL
  Filled 2017-01-04 (×4): qty 1

## 2017-01-04 MED ORDER — ENOXAPARIN SODIUM 40 MG/0.4ML ~~LOC~~ SOLN: 40.0000 mg | SUBCUTANEOUS | Status: DC

## 2017-01-04 MED ORDER — TRAMADOL HCL 50 MG PO TABS
50.0000 mg | ORAL_TABLET | Freq: Two times a day (BID) | ORAL | Status: DC
  Administered 2017-01-04 – 2017-01-07 (×6): 50 mg via ORAL
  Filled 2017-01-04 (×6): qty 1

## 2017-01-04 MED ORDER — MORPHINE SULFATE (PF) 4 MG/ML IV SOLN
2.0000 mg | Freq: Once | INTRAVENOUS | Status: AC
  Administered 2017-01-04: 2 mg via INTRAVENOUS
  Filled 2017-01-04: qty 1

## 2017-01-04 MED ORDER — ONDANSETRON HCL 4 MG PO TABS
4.0000 mg | ORAL_TABLET | Freq: Four times a day (QID) | ORAL | Status: DC | PRN
  Filled 2017-01-04: qty 1

## 2017-01-04 MED ORDER — SODIUM CHLORIDE 0.9 % IV SOLN
INTRAVENOUS | Status: DC
  Administered 2017-01-04: 20:00:00 via INTRAVENOUS

## 2017-01-04 MED ORDER — INSULIN ASPART 100 UNIT/ML ~~LOC~~ SOLN: 2.0000 [IU] | SUBCUTANEOUS | Status: DC

## 2017-01-04 MED ORDER — LEVALBUTEROL HCL 0.63 MG/3ML IN NEBU: 0.6300 mg | INHALATION_SOLUTION | Freq: Four times a day (QID) | RESPIRATORY_TRACT | Status: DC | PRN

## 2017-01-04 MED ORDER — OXYCODONE-ACETAMINOPHEN 5-325 MG PO TABS
1.0000 | ORAL_TABLET | ORAL | Status: DC | PRN
  Administered 2017-01-05: 2 via ORAL
  Administered 2017-01-05: 1 via ORAL
  Administered 2017-01-05 – 2017-01-07 (×5): 2 via ORAL
  Filled 2017-01-04 (×7): qty 2

## 2017-01-04 MED ORDER — INSULIN GLARGINE 100 UNIT/ML ~~LOC~~ SOLN
12.0000 [IU] | SUBCUTANEOUS | Status: DC
  Administered 2017-01-05 – 2017-01-07 (×3): 12 [IU] via SUBCUTANEOUS
  Filled 2017-01-04 (×4): qty 0.12

## 2017-01-04 MED ORDER — ONDANSETRON HCL 4 MG/2ML IJ SOLN
4.0000 mg | Freq: Four times a day (QID) | INTRAMUSCULAR | Status: DC | PRN
  Administered 2017-01-05 – 2017-01-06 (×2): 4 mg via INTRAVENOUS
  Filled 2017-01-04 (×3): qty 2

## 2017-01-04 MED ORDER — SODIUM CHLORIDE 0.9 % IV BOLUS (SEPSIS)
1000.0000 mL | Freq: Once | INTRAVENOUS | Status: AC
  Administered 2017-01-04: 1000 mL via INTRAVENOUS

## 2017-01-04 MED ORDER — INSULIN ASPART 100 UNIT/ML ~~LOC~~ SOLN: 0.0000 [IU] | Freq: Three times a day (TID) | SUBCUTANEOUS | Status: DC

## 2017-01-04 MED ORDER — SIMVASTATIN 40 MG PO TABS
40.0000 mg | ORAL_TABLET | Freq: Every evening | ORAL | Status: DC
  Administered 2017-01-04 – 2017-01-06 (×3): 40 mg via ORAL
  Filled 2017-01-04 (×3): qty 1

## 2017-01-04 MED ORDER — ACETAMINOPHEN 650 MG RE SUPP: 650.0000 mg | Freq: Four times a day (QID) | RECTAL | Status: DC | PRN

## 2017-01-04 MED ORDER — ALBUTEROL SULFATE HFA 108 (90 BASE) MCG/ACT IN AERS
1.0000 | INHALATION_SPRAY | RESPIRATORY_TRACT | Status: DC | PRN
Start: 2017-01-04 — End: 2017-01-04

## 2017-01-04 MED ORDER — SODIUM CHLORIDE 0.9% FLUSH
3.0000 mL | Freq: Two times a day (BID) | INTRAVENOUS | Status: DC
  Administered 2017-01-05 – 2017-01-06 (×3): 3 mL via INTRAVENOUS

## 2017-01-04 MED ORDER — HYDRALAZINE HCL 20 MG/ML IJ SOLN: 5.0000 mg | Freq: Four times a day (QID) | INTRAMUSCULAR | Status: DC | PRN

## 2017-01-04 NOTE — Progress Notes (Signed)
Patient arrived to 3E from the ED. Vitals obtained. Telemetry placed. Patient alert and oriented. Report given to night shift RN.

## 2017-01-04 NOTE — ED Notes (Signed)
Patient from xray to treatment room

## 2017-01-04 NOTE — H&P (Addendum)
[facility] History and Physical  Danielle Clarke ZJI:[id number] DOB: May 29, 1941 DOA: 01/04/2017  Referring physician:   PCP: Gaye Alken, MD   Chief Complaint:  Syncope  HPI:  76 yo F withi PMHx of T1DM, HTN, HLD, known carotid artery disease with 40-50% carotid stenosis, mild AS who presents with syncopal episode at 6 PM on 2/7. Patient has a history of recurrent syncope and is followed closely by Dr. Verdis Prime her cardiologist. She is also followed by Dr. Talmage Nap for her diabetes. Pt was in usual state of health last night around 6 PM. She had just finished her dinner which she covered with 10 units of sliding scale insulin. She was standing near the table when she lost consciousness without any prodrome. She had NO preceding CP, SOB, palpitations, sweatiness/diaphoresis, or warning sx. She awoke around 6:15 and realized that she was on the floor but could not get off the floor secondary to pain. She states that she attempted to get off the floor to call her daughter-in-law and her son who live close by, but could not get the phone until 10:15. Son arrived around 10:30 PM to check on her. She has since had severe, pleuritic, sharp left flank pain that is worse with breathing and movement. She saw her primary care today at [facility]  who sent her for evaluation. Denies any fever or chills. No chest pain, SOB. No weakness or numbness. No vision changes. In the ER patient is complaining of left-sided chest wall pain secondary to her rib fracture, CBG 218, creatinine 1.0, white blood cell count 9.2, UA negative. Patient has a blood pressure in the 160s. Currently 97% on room air. Patient is being admitted for syncope.       Review of Systems: negative for the following  Constitutional: Denies fever, chills, diaphoresis, appetite change and Positive for fatigue HEENT: Denies photophobia, eye pain, redness, hearing loss, ear pain, congestion, sore throat, rhinorrhea, sneezing, mouth  sores, trouble swallowing, neck pain, neck stiffness and tinnitus.  Respiratory: Denies SOB, DOE, cough, chest tightness, and wheezing.  Cardiovascular:: Positive for chest pain (left side). , palpitations and leg swelling.  Gastrointestinal: Denies nausea, vomiting, abdominal pain, diarrhea, constipation, blood in stool and abdominal distention.  Genitourinary: Denies dysuria, urgency, frequency, hematuria, flank pain and difficulty urinating.  Musculoskeletal: Denies myalgias, back pain, joint swelling, arthralgias and gait problem.  Skin: Denies pallor, rash and wound.  Neurological: Denies dizziness, seizures, positive for syncope, weakness, light-headedness, numbness and headaches.  Hematological: Denies adenopathy. Easy bruising, personal or family bleeding history  Psychiatric/Behavioral: Denies suicidal ideation, mood changes, confusion, nervousness, sleep disturbance and agitation       Past Medical History:  Diagnosis Date  . Arthritis   . Asthmatic bronchitis    with colds per patient  . Cataract   . Glaucoma   . Hyperlipemia   . Hypertension    Dr. Katrinka Blazing ~ 2 years ago  . Neck fracture [facility])    july 2013  . Osteoporosis   . Type 1 diabetes mellitus (HCC)      Past Surgical History:  Procedure Laterality Date  . ANTERIOR CERVICAL CORPECTOMY N/A 07/31/2014   Procedure: Cervical Four to Cervical Six Corpectomy;  Surgeon: Karn Cassis, MD;  Location: [facility];  Service: Neurosurgery;  Laterality: N/A;  C4 to C6 Corpectomy  . BREAST SURGERY  1988  . CATARACT EXTRACTION Bilateral 2011   right and then left  . EYE SURGERY    . FRACTURE SURGERY    .  HAMMER TOE SURGERY  1998  . JOINT REPLACEMENT    . Left ankle surgery  2001   steel plate and 3 screws   . TONSILLECTOMY AND ADENOIDECTOMY  1948  . TOTAL SHOULDER REPLACEMENT  2010   right shoulder   . TUBAL LIGATION  1979  . TYMPANOSTOMY TUBE PLACEMENT Bilateral       Social History:  reports that she has  never smoked. She has never used smokeless tobacco. She reports that she drinks about 0.6 oz of alcohol per week . She reports that she uses drugs, including Mescaline.    Allergies  Allergen Reactions  . Cefaclor Anaphylaxis  . Tetracycline Anaphylaxis  . Vancomycin Anaphylaxis  . Augmentin [Amoxicillin-Pot Clavulanate] Other (See Comments)    Reaction unknown  . Prednisone Other (See Comments)    Reaction unknown    Family History  Problem Relation Age of Onset  . Cancer Mother   . Heart Problems Father         Prior to Admission medications   Medication Sig Start Date End Date Taking? Authorizing Provider  acetaminophen (TYLENOL) 500 MG tablet Take 500 mg by mouth 3 (three) times daily.    Yes Historical Provider, MD  aspirin EC 81 MG tablet Take 81 mg by mouth daily.   Yes Historical Provider, MD  cholecalciferol (VITAMIN D) 1000 UNITS tablet Take 1,000 Units by mouth daily.    Yes Historical Provider, MD  Chromium Picolinate 1000 MCG TABS Take 1,000 mcg by mouth daily.    Yes Historical Provider, MD  cycloSPORINE (RESTASIS) 0.05 % ophthalmic emulsion Place 1 drop into both eyes 2 (two) times daily.   Yes Historical Provider, MD  diclofenac (VOLTAREN) 75 MG EC tablet Take 75 mg by mouth daily.   Yes Historical Provider, MD  glucose blood test strip 1 each by Other route as needed for other (USE TO TEST YOUR BLOOD SUGAR UP TO 8 TIMES DAILY AS NEEDED). Use as instructed   Yes Historical Provider, MD  insulin aspart (NOVOLOG) 100 UNIT/ML injection Inject 2-6 Units into the skin See admin instructions. Sliding scale. Base= 2 units-Reading below 80 subtract 1; 80-150 take 2 units (base dose); 151-200 add 1 unit; 201-250 add 2 units; 251-300 add 3 units; 301-350 add 4 units; 351-400 add 5 units; 401-450 add 6 units   Yes Historical Provider, MD  Insulin Glargine (BASAGLAR KWIKPEN) 100 UNIT/ML SOPN Inject 12 Units into the skin every morning.   Yes Historical Provider, MD  Insulin Pen  Needle (PEN NEEDLES) 31G X 8 MM MISC by Does not apply route as needed (USE TO INJECT INSULIN INTO THE SKIN AS DIRECTED TWICE DAILY).   Yes Historical Provider, MD  Magnesium 250 MG TABS Take 250 mg by mouth daily.   Yes Historical Provider, MD  Multiple Vitamins-Minerals (WOMENS MULTIVITAMIN PLUS PO) Take 1 tablet by mouth daily.   Yes Historical Provider, MD  neomycin-polymyxin-dexameth (MAXITROL) 0.1 % OINT Place 1 application into the right eye at bedtime.   Yes Historical Provider, MD  Polyethyl Glycol-Propyl Glycol (SYSTANE OP) Place 1 drop into both eyes daily as needed (dry eyes).   Yes Historical Provider, MD  simvastatin (ZOCOR) 40 MG tablet Take 40 mg by mouth every evening.   Yes Historical Provider, MD  traMADol (ULTRAM) 50 MG tablet Take 50 mg by mouth 2 (two) times daily.    Yes Historical Provider, MD  Turmeric 500 MG CAPS Take 500 mg by mouth daily.   Yes Historical Provider,  MD  VENTOLIN HFA 108 (90 BASE) MCG/ACT inhaler Inhale 1-2 puffs into the lungs every 4 (four) hours as needed for wheezing or shortness of breath.  05/13/13  Yes Historical Provider, MD     Physical Exam: Vitals:   01/04/17 1600 01/04/17 1615 01/04/17 1743 01/04/17 1815  BP: 165/94 174/62 161/76 169/89  Pulse: 72 77 81 73  Resp: 13 16 17 20   Temp:      TempSrc:      SpO2: 99% 97% 98% 98%  Weight:      Height:          Constitutional: NAD, calm, comfortable Vitals:   01/04/17 1600 01/04/17 1615 01/04/17 1743 01/04/17 1815  BP: 165/94 174/62 161/76 169/89  Pulse: 72 77 81 73  Resp: 13 16 17 20   Temp:      TempSrc:      SpO2: 99% 97% 98% 98%  Weight:      Height:       Eyes: PERRL, lids and conjunctivae normal ENMT: Mucous membranes are moist. Posterior pharynx clear of any exudate or lesions.Normal dentition.  Neck: normal, supple, no masses, no thyromegaly Respiratory: clear to auscultation bilaterally, no wheezing, no crackles. Normal respiratory effort. No accessory muscle use.   Cardiovascular: Regular rate and rhythm, 3 x 6 systolic murmurs radiating to the carotid/ rubs / gallops. No extremity edema. 2+ pedal pulses. No carotid bruits. Tenderness to palpation of the left chest wall Abdomen: no tenderness, no masses palpated. No hepatosplenomegaly. Bowel sounds positive.  Musculoskeletal: no clubbing / cyanosis. No joint deformity upper and lower extremities. Good ROM, no contractures. Normal muscle tone.  Skin: no rashes, lesions, ulcers. No induration Neurologic: CN 2-12 grossly intact. Sensation intact, DTR normal. Strength 5/5 in all 4.  Psychiatric: Normal judgment and insight. Alert and oriented x 3. Normal mood.     Labs on Admission: I have personally reviewed following labs and imaging studies  CBC:  Recent Labs Lab 01/04/17 1328  WBC 9.2  HGB 11.2*  HCT 34.7*  MCV 91.6  PLT 302    Basic Metabolic Panel:  Recent Labs Lab 01/04/17 1328  NA 137  K 4.9  CL 101  CO2 26  GLUCOSE 218*  BUN 28*  CREATININE 1.00  CALCIUM 9.3    GFR: Estimated Creatinine Clearance: 39 mL/min (by C-G formula based on SCr of 1 mg/dL).  Liver Function Tests: No results for input(s): AST, ALT, ALKPHOS, BILITOT, PROT, ALBUMIN in the last 168 hours. No results for input(s): LIPASE, AMYLASE in the last 168 hours. No results for input(s): AMMONIA in the last 168 hours.  Coagulation Profile: No results for input(s): INR, PROTIME in the last 168 hours. No results for input(s): DDIMER in the last 72 hours.  Cardiac Enzymes: No results for input(s): CKTOTAL, CKMB, CKMBINDEX, TROPONINI in the last 168 hours.  BNP (last 3 results) No results for input(s): PROBNP in the last 8760 hours.  HbA1C: No results for input(s): HGBA1C in the last 72 hours. No results found for: HGBA1C   CBG:  Recent Labs Lab 01/04/17 1328  GLUCAP 219*    Lipid Profile: No results for input(s): CHOL, HDL, LDLCALC, TRIG, CHOLHDL, LDLDIRECT in the last 72 hours.  Thyroid  Function Tests: No results for input(s): TSH, T4TOTAL, FREET4, T3FREE, THYROIDAB in the last 72 hours.  Anemia Panel: No results for input(s): VITAMINB12, FOLATE, FERRITIN, TIBC, IRON, RETICCTPCT in the last 72 hours.  Urine analysis:    Component Value Date/Time  COLORURINE YELLOW 01/04/2017 1550   APPEARANCEUR CLEAR 01/04/2017 1550   LABSPEC 1.020 01/04/2017 1550   PHURINE 6.0 01/04/2017 1550   GLUCOSEU 100 (A) 01/04/2017 1550   HGBUR NEGATIVE 01/04/2017 1550   BILIRUBINUR NEGATIVE 01/04/2017 1550   KETONESUR 15 (A) 01/04/2017 1550   PROTEINUR NEGATIVE 01/04/2017 1550   UROBILINOGEN 0.2 10/07/2014 1808   NITRITE NEGATIVE 01/04/2017 1550   LEUKOCYTESUR TRACE (A) 01/04/2017 1550    Sepsis Labs: @LABRCNTIP (procalcitonin:4,lacticidven:4) )No results found for this or any previous visit (from the past 240 hour(s)).       Radiological Exams on Admission: Dg Ribs Unilateral W/chest Left  Result Date: 01/04/2017 CLINICAL DATA:  Anterior and inferior left rib pain for 1 day since a fall due to hypoglycemia. EXAM: LEFT RIBS AND CHEST - 3+ VIEW COMPARISON:  Plain films chest and left ribs 01/12/2015. FINDINGS: Lungs are clear. Heart size normal. Aortic atherosclerosis is seen. Right shoulder replacement and postoperative change of cervical fusion are noted. There is thoracolumbar scoliosis. The patient has an acute fracture of the left tenth rib. The fracture underlies a marker placed in the region of concern. No other fracture is identified. IMPRESSION: Acute left tenth rib fracture. Negative for pneumothorax or acute cardiopulmonary disease. Atherosclerosis. Scoliosis. Electronically Signed   By: Drusilla Kannerhomas  Dalessio M.D.   On: 01/04/2017 14:34   Ct Head Wo Contrast  Result Date: 01/04/2017 CLINICAL DATA:  Generalized weakness after syncopal episode last night. EXAM: CT HEAD WITHOUT CONTRAST TECHNIQUE: Contiguous axial images were obtained from the base of the skull through the vertex  without intravenous contrast. COMPARISON:  10/07/2014 FINDINGS: Brain: There is no evidence of acute cortical infarct, intracranial hemorrhage, mass, midline shift, or extra-axial fluid collection. Ventricles and sulci are normal for age. Vascular: Calcified atherosclerosis at the skullbase. No hyperdense vessel. Skull: No fracture or focal osseous lesion. Sinuses/Orbits: Small right mastoid effusion. Clear paranasal sinuses. Prior bilateral cataract extraction. Other: None. IMPRESSION: No evidence of acute intracranial abnormality. Electronically Signed   By: Sebastian AcheAllen  Grady M.D.   On: 01/04/2017 17:16   Dg Ribs Unilateral W/chest Left  Result Date: 01/04/2017 CLINICAL DATA:  Anterior and inferior left rib pain for 1 day since a fall due to hypoglycemia. EXAM: LEFT RIBS AND CHEST - 3+ VIEW COMPARISON:  Plain films chest and left ribs 01/12/2015. FINDINGS: Lungs are clear. Heart size normal. Aortic atherosclerosis is seen. Right shoulder replacement and postoperative change of cervical fusion are noted. There is thoracolumbar scoliosis. The patient has an acute fracture of the left tenth rib. The fracture underlies a marker placed in the region of concern. No other fracture is identified. IMPRESSION: Acute left tenth rib fracture. Negative for pneumothorax or acute cardiopulmonary disease. Atherosclerosis. Scoliosis. Electronically Signed   By: Drusilla Kannerhomas  Dalessio M.D.   On: 01/04/2017 14:34   Ct Head Wo Contrast  Result Date: 01/04/2017 CLINICAL DATA:  Generalized weakness after syncopal episode last night. EXAM: CT HEAD WITHOUT CONTRAST TECHNIQUE: Contiguous axial images were obtained from the base of the skull through the vertex without intravenous contrast. COMPARISON:  10/07/2014 FINDINGS: Brain: There is no evidence of acute cortical infarct, intracranial hemorrhage, mass, midline shift, or extra-axial fluid collection. Ventricles and sulci are normal for age. Vascular: Calcified atherosclerosis at the  skullbase. No hyperdense vessel. Skull: No fracture or focal osseous lesion. Sinuses/Orbits: Small right mastoid effusion. Clear paranasal sinuses. Prior bilateral cataract extraction. Other: None. IMPRESSION: No evidence of acute intracranial abnormality. Electronically Signed   By: Jolaine ClickAllen  Grady M.D.  On: 01/04/2017 17:16      EKG: Independently reviewed. * EKG Interpretation  Date/Time:                  Thursday January 04 2017 14:52:31 EST Ventricular Rate:   72 PR Interval:                        QRS Duration:        108 QT Interval:                      418 QTC Calculation:    458 R Axis:                         14 Text Interpretation:  Sinus rhythm Anterior infarct Assessment/Plan Principal Problem:   Syncope, unspecified Previously related to abnormal swallowing Patient also has carotid artery stenosis and aortic stenosis Patient will be admitted to telemetry for a full cardiac workup Will order 2-D echo, carotid Doppler Cardiology Dr. Mayford Knife was consulted for cardiology consult, however she requested ER physician to complete workup and then call cardiology Patient could have a component of autonomic dysfunction as well in the setting of recently uncontrolled CBG and uncontrolled diabetes Patient lives alone and will need  PT OT evaluation to once the end of her hospitalization Consider full cardiology evaluation once carotid Doppler and 2-D echo have resulted      Diabetes mellitus type 1 (HCC)- Followed by Dr. Talmage Nap, very labile She brings in her log which shows that she can fluctuate from 70s to 500s in 1 day She was >400 at 6:00 pm , prior to her syncopal episode Following a syncopal episode she was 43 She states that her Lantus was recently changed to Basaglar, and since then her CBGs have been uncontrolled We'll check a hemoglobin A1c     Bilateral carotid artery disease (HCC) Will check carotid Doppler    CKD (chronic kidney disease) stage 2, GFR 60-89  ml/min Baseline creatinine is around 1 Creatinine at baseline today    Essential hypertension, benign-currently on no medications at home We will use prn hydralazine  Rib fracture-conservative management with pain control No pneumothorax on chest x-ray    Hyperlipidemia   Continue statin   Pulmonary hypertension, uncertain etiology Likely secondary and requires no further follow-up at this time.     DVT prophylaxis: Lovenox   CODE STATUS-full code      consults called:Cardiology-Dr. Mayford Knife  Family Communication: Admission, patients condition and plan of care including tests being ordered have been discussed with the patient  who indicates understanding and agree with the plan and Code Status  Admission status:Observation  Disposition plan: Further plan will depend as patient's clinical course evolves and further radiologic and laboratory data become available. Likely home when stable     [facility] MD [facility] Pager [phone]  If 7PM-7AM, please contact night-coverage [email or web address] Password [facility]  01/04/2017, 6:24 PM

## 2017-01-04 NOTE — ED Notes (Signed)
PT did not have to use RR at this time  

## 2017-01-04 NOTE — ED Provider Notes (Addendum)
[facility]-EMERGENCY DEPT Provider Note   CSN: [id number] Arrival date & time: 01/04/17  1304     History   Chief Complaint Chief Complaint  Patient presents with  . Fall  . Loss of Consciousness    HPI Danielle FordyceJacqueline J Clarke is a 76 y.o. female.  HPI   76 yo F withi PMHx of T1DM, HTN, HLD, known PAD with 40-50% carotid stenosis, AS who p/w syncopal episode. Pt was in usual state of health last night around 6 PM. She had just finished her dinner which she covered with her normal insulin. She was standing near the table when she lost consciousness. She had NO preceding CP, SOB, palpitations, sweatiness/diaphoresis, or warning sx. She awoke around 10:30 PM and called her son who evaluated her. She has since had severe, pleuritic, sharp left flank pain that is worse with breathing and movement. [facility] who sent her for evaluation. Denies any fever or chills. No chest pain, SOB. No weakness or numbness. No vision changes.  Past Medical History:  Diagnosis Date  . Arthritis   . Asthmatic bronchitis    with colds per patient  . Cataract   . Glaucoma   . Hyperlipemia   . Hypertension    Dr. [name] ~ 2 years ago  . Neck fracture [facility](HCC)    july 2013  . Osteoporosis   . Type 1 diabetes mellitus [facility](HCC)     Patient Active Problem List   Diagnosis Date Noted  . Aortic stenosis 08/25/2015  . Cervical stenosis of spinal canal 07/31/2014  . CKD (chronic kidney disease) stage 2, GFR 60-89 ml/min 12/30/2013  . Essential hypertension, benign 12/30/2013  . Hyperlipidemia 12/30/2013  . Toe infection 12/30/2013  . Anemia 12/30/2013  . Acute renal failure (HCC) 12/29/2013  . Syncope 01/02/2013  . Diabetes mellitus type 1 (HCC) 01/02/2013  . Bilateral carotid artery disease (HCC) 01/02/2013  . GOITER, MULTINODULAR 02/11/2008    Past Surgical History:  Procedure Laterality Date  . ANTERIOR CERVICAL CORPECTOMY N/A 07/31/2014   Procedure: Cervical Four to Cervical Six Corpectomy;   Surgeon: Karn CassisErnesto M Botero, MD;  Location: [facility] NEURO ORS;  Service: Neurosurgery;  Laterality: N/A;  C4 to C6 Corpectomy  . BREAST SURGERY  1988  . CATARACT EXTRACTION Bilateral 2011   right and then left  . EYE SURGERY    . FRACTURE SURGERY    . HAMMER TOE SURGERY  1998  . JOINT REPLACEMENT    . Left ankle surgery  2001   steel plate and 3 screws   . TONSILLECTOMY AND ADENOIDECTOMY  1948  . TOTAL SHOULDER REPLACEMENT  2010   right shoulder   . TUBAL LIGATION  1979  . TYMPANOSTOMY TUBE PLACEMENT Bilateral     OB History    No data available       Home Medications    Prior to Admission medications   Medication Sig Start Date End Date Taking? Authorizing Provider  acetaminophen (TYLENOL) 500 MG tablet Take 500 mg by mouth 3 (three) times daily.    Yes Historical Provider, MD  aspirin EC 81 MG tablet Take 81 mg by mouth daily.   Yes Historical Provider, MD  cholecalciferol (VITAMIN D) 1000 UNITS tablet Take 1,000 Units by mouth daily.    Yes Historical Provider, MD  Chromium Picolinate 1000 MCG TABS Take 1,000 mcg by mouth daily.    Yes Historical Provider, MD  cycloSPORINE (RESTASIS) 0.05 % ophthalmic emulsion Place 1 drop into both eyes 2 (  two) times daily.   Yes Historical Provider, MD  diclofenac (VOLTAREN) 75 MG EC tablet Take 75 mg by mouth daily.   Yes Historical Provider, MD  glucose blood test strip 1 each by Other route as needed for other (USE TO TEST YOUR BLOOD SUGAR UP TO 8 TIMES DAILY AS NEEDED). Use as instructed   Yes Historical Provider, MD  insulin aspart (NOVOLOG) 100 UNIT/ML injection Inject 2-6 Units into the skin See admin instructions. Sliding scale. Base= 2 units-Reading below 80 subtract 1; 80-150 take 2 units (base dose); 151-200 add 1 unit; 201-250 add 2 units; 251-300 add 3 units; 301-350 add 4 units; 351-400 add 5 units; 401-450 add 6 units   Yes Historical Provider, MD  Insulin Glargine (BASAGLAR KWIKPEN) 100 UNIT/ML SOPN Inject 12 Units into the skin every  morning.   Yes Historical Provider, MD  Insulin Pen Needle (PEN NEEDLES) 31G X 8 MM MISC by Does not apply route as needed (USE TO INJECT INSULIN INTO THE SKIN AS DIRECTED TWICE DAILY).   Yes Historical Provider, MD  Magnesium 250 MG TABS Take 250 mg by mouth daily.   Yes Historical Provider, MD  Multiple Vitamins-Minerals (WOMENS MULTIVITAMIN PLUS PO) Take 1 tablet by mouth daily.   Yes Historical Provider, MD  neomycin-polymyxin-dexameth (MAXITROL) 0.1 % OINT Place 1 application into the right eye at bedtime.   Yes Historical Provider, MD  Polyethyl Glycol-Propyl Glycol (SYSTANE OP) Place 1 drop into both eyes daily as needed (dry eyes).   Yes Historical Provider, MD  simvastatin (ZOCOR) 40 MG tablet Take 40 mg by mouth every evening.   Yes Historical Provider, MD  traMADol (ULTRAM) 50 MG tablet Take 50 mg by mouth 2 (two) times daily.    Yes Historical Provider, MD  Turmeric 500 MG CAPS Take 500 mg by mouth daily.   Yes Historical Provider, MD  VENTOLIN HFA 108 (90 BASE) MCG/ACT inhaler Inhale 1-2 puffs into the lungs every 4 (four) hours as needed for wheezing or shortness of breath.  05/13/13  Yes Historical Provider, MD    Family History Family History  Problem Relation Age of Onset  . Cancer Mother   . Heart Problems Father     Social History Social History  Substance Use Topics  . Smoking status: Never Smoker  . Smokeless tobacco: Never Used  . Alcohol use 0.6 oz/week    1 Glasses of wine per week     Comment: occasionally     Allergies   Cefaclor; Tetracycline; Vancomycin; Augmentin [amoxicillin-pot clavulanate]; and Prednisone   Review of Systems Review of Systems  Constitutional: Positive for fatigue. Negative for fever.  Respiratory: Negative for chest tightness and shortness of breath.   Cardiovascular: Positive for chest pain (left side).  Genitourinary: Positive for flank pain.  Neurological: Positive for weakness.  All other systems reviewed and are  negative.    Physical Exam Updated Vital Signs BP 174/62   Pulse 77   Temp 98.1 F (36.7 C) (Oral)   Resp 16   Ht 5\' 2"  (1.575 m)   Wt 115 lb (52.2 kg)   SpO2 97%   BMI 21.03 kg/m   Physical Exam  Constitutional: She is oriented to person, place, and time. She appears well-developed and well-nourished. No distress.  HENT:  Head: Normocephalic and atraumatic.  Eyes: Conjunctivae are normal.  Neck: Neck supple.  Cardiovascular: Normal rate and regular rhythm.  Exam reveals no friction rub.   Murmur heard.  Systolic murmur is present  with a grade of 3/6  Pulmonary/Chest: Effort normal and breath sounds normal. No respiratory distress. She has no wheezes. She has no rales.  Abdominal: She exhibits no distension.  Musculoskeletal: She exhibits no edema.  Neurological: She is alert and oriented to person, place, and time. She exhibits normal muscle tone.  Skin: Skin is warm. Capillary refill takes less than 2 seconds.  Psychiatric: She has a normal mood and affect.  Nursing note and vitals reviewed.   Neurological Exam:  Mental Status: Alert and oriented to person, place, and time. Attention and concentration normal. Speech clear. Recent memory is intact. Cranial Nerves: Visual fields grossly intact. EOMI and PERRLA. No nystagmus noted. Facial sensation intact at forehead, maxillary cheek, and chin/mandible bilaterally. No facial asymmetry or weakness. Hearing grossly normal. Uvula is midline, and palate elevates symmetrically. Normal SCM and trapezius strength. Tongue midline without fasciculations. Motor: Muscle strength 5/5 in proximal and distal UE and LE bilaterally. No pronator drift. Muscle tone normal. Reflexes: 2+ and symmetrical in all four extremities.  Sensation: Intact to light touch in upper and lower extremities distally bilaterally.  Gait: Normal without ataxia. Coordination: Normal FTN bilaterally.     ED Treatments / Results  Labs (all labs ordered are  listed, but only abnormal results are displayed) Labs Reviewed  BASIC METABOLIC PANEL - Abnormal; Notable for the following:       Result Value   Glucose, Bld 218 (*)    BUN 28 (*)    GFR calc non Af Amer 54 (*)    All other components within normal limits  CBC - Abnormal; Notable for the following:    RBC 3.79 (*)    Hemoglobin 11.2 (*)    HCT 34.7 (*)    All other components within normal limits  URINALYSIS, ROUTINE W REFLEX MICROSCOPIC - Abnormal; Notable for the following:    Glucose, UA 100 (*)    Ketones, ur 15 (*)    Leukocytes, UA TRACE (*)    All other components within normal limits  URINALYSIS, MICROSCOPIC (REFLEX) - Abnormal; Notable for the following:    Squamous Epithelial / LPF 0-5 (*)    All other components within normal limits  CBG MONITORING, ED - Abnormal; Notable for the following:    Glucose-Capillary 219 (*)    All other components within normal limits  CBG MONITORING, ED  I-STAT CG4 LACTIC ACID, ED  I-STAT CG4 LACTIC ACID, ED    EKG  EKG Interpretation  Date/Time:  Thursday January 04 2017 14:52:31 EST Ventricular Rate:  72 PR Interval:    QRS Duration: 108 QT Interval:  418 QTC Calculation: 458 R Axis:   14 Text Interpretation:  Sinus rhythm Anterior infarct, old No significant change since last tracing Confirmed by Sarabi Sockwell MD, Amirr Achord [phone]) on 01/04/2017 4:02:53 PM       Radiology Dg Ribs Unilateral W/chest Left  Result Date: 01/04/2017 CLINICAL DATA:  Anterior and inferior left rib pain for 1 day since a fall due to hypoglycemia. EXAM: LEFT RIBS AND CHEST - 3+ VIEW COMPARISON:  Plain films chest and left ribs 01/12/2015. FINDINGS: Lungs are clear. Heart size normal. Aortic atherosclerosis is seen. Right shoulder replacement and postoperative change of cervical fusion are noted. There is thoracolumbar scoliosis. The patient has an acute fracture of the left tenth rib. The fracture underlies a marker placed in the region of concern. No other  fracture is identified. IMPRESSION: Acute left tenth rib fracture. Negative for pneumothorax or acute cardiopulmonary disease.  Atherosclerosis. Scoliosis. Electronically Signed   By: Drusilla Kanner M.D.   On: 01/04/2017 14:34    Procedures Procedures (including critical care time)  Medications Ordered in ED Medications  sodium chloride 0.9 % bolus 1,000 mL (not administered)  morphine 4 MG/ML injection 2 mg (not administered)     Initial Impression / Assessment and Plan / ED Course  I have reviewed the triage vital signs and the nursing notes.  Pertinent labs & imaging results that were available during my care of the patient were reviewed by me and considered in my medical decision making (see chart for details).   76 yo F with PMHx of AS, T1DM (brittle), HTN, HLD here with left chest wall pain and syncopal episode. Regarding her chest wall pain, plain films show rib fx. No PTX or pulmonary effusion. Normal WOB and satting well on RA - will treat with analgesia.  Regarding her syncope, she has multiple high risk features including known carotid stenosis, significant AS murmur, as well as brittle DM. Pt has h/o recurrent hypoglycemic syncope but lack of prodrome raises concern for cardiovascular etiology. Does not seem like she had a seizure. She has no focal neuro deficits to suggest CVA. Screening labs do show moderate dehydration. Will start IVF, obtain CT head, plan for high risk syncope admission with possible repeat of TTE and Carotids as indicated. Pt in agreement.  UPDATE: D/w Cardiology at the request of the Hospitalist. Dr. Mayford Knife recommends repeat TTE, consult in AM if indicated.   Orthostatic VS - Lying from 01/03/17 1803 to 01/04/17 1803   Date/Time  01/04/17 1739  BP- Lying: 150/59  Pulse- Lying: 72  User: BLW   Orthostatic VS - Sitting from 01/03/17 1803 to 01/04/17 1803   Date/Time  01/04/17 1739  BP- Sitting: 155/59  Pulse- Sitting: 77  Who: BLW   Orthostatic  VS - Standing from 01/03/17 1803 to 01/04/17 1803   Date/Time  01/04/17 1739  BP- Standing at 0 minutes: 161/76  Pulse- Standing at 0 minutes: 82        Final Clinical Impressions(s) / ED Diagnoses   Final diagnoses:  Syncope, unspecified syncope type  Ketonuria  Bilateral carotid artery stenosis  Nonrheumatic aortic valve stenosis      Shaune Pollack, MD 01/04/17 1610    Shaune Pollack, MD 01/04/17 [phone]

## 2017-01-04 NOTE — ED Triage Notes (Signed)
Pt sent [facility] for eval of fall and possible syncopal episode last night; pt sts her CBG was been both high and low recently; pt sts generalized weakness

## 2017-01-05 ENCOUNTER — Observation Stay (HOSPITAL_BASED_OUTPATIENT_CLINIC_OR_DEPARTMENT_OTHER): Payer: Medicare Other

## 2017-01-05 DIAGNOSIS — R55 Syncope and collapse: Secondary | ICD-10-CM

## 2017-01-05 DIAGNOSIS — N182 Chronic kidney disease, stage 2 (mild): Secondary | ICD-10-CM

## 2017-01-05 DIAGNOSIS — N181 Chronic kidney disease, stage 1: Secondary | ICD-10-CM

## 2017-01-05 DIAGNOSIS — I779 Disorder of arteries and arterioles, unspecified: Secondary | ICD-10-CM | POA: Diagnosis not present

## 2017-01-05 DIAGNOSIS — E7800 Pure hypercholesterolemia, unspecified: Secondary | ICD-10-CM

## 2017-01-05 DIAGNOSIS — E1022 Type 1 diabetes mellitus with diabetic chronic kidney disease: Secondary | ICD-10-CM | POA: Diagnosis not present

## 2017-01-05 LAB — COMPREHENSIVE METABOLIC PANEL
ALT: 16 U/L (ref 14–54)
AST: 18 U/L (ref 15–41)
Albumin: 3.4 g/dL — ABNORMAL LOW (ref 3.5–5.0)
Alkaline Phosphatase: 59 U/L (ref 38–126)
Anion gap: 6 (ref 5–15)
BUN: 17 mg/dL (ref 6–20)
CO2: 27 mmol/L (ref 22–32)
Calcium: 8.6 mg/dL — ABNORMAL LOW (ref 8.9–10.3)
Chloride: 106 mmol/L (ref 101–111)
Creatinine, Ser: 0.95 mg/dL (ref 0.44–1.00)
GFR calc Af Amer: >60 mL/min (ref >60)
GFR calc non Af Amer: 58 mL/min — ABNORMAL LOW (ref >60)
Glucose, Bld: 149 mg/dL — ABNORMAL HIGH (ref 65–99)
Potassium: 4.5 mmol/L (ref 3.5–5.1)
Sodium: 139 mmol/L (ref 135–145)
Total Bilirubin: 0.6 mg/dL (ref 0.3–1.2)
Total Protein: 6 g/dL — ABNORMAL LOW (ref 6.5–8.1)

## 2017-01-05 LAB — CBC
HCT: 32.5 % — ABNORMAL LOW (ref 36.0–46.0)
Hemoglobin: 10.5 g/dL — ABNORMAL LOW (ref 12.0–15.0)
MCH: 29.5 pg (ref 26.0–34.0)
MCHC: 32.3 g/dL (ref 30.0–36.0)
MCV: 91.3 fL (ref 78.0–100.0)
Platelets: 264 10*3/uL (ref 150–400)
RBC: 3.56 MIL/uL — ABNORMAL LOW (ref 3.87–5.11)
RDW: 14.4 % (ref 11.5–15.5)
WBC: 5.4 10*3/uL (ref 4.0–10.5)

## 2017-01-05 LAB — VAS US CAROTID
LEFT VERTEBRAL DIAS: -18 cm/s
Left CCA dist dias: 12 cm/s
Left CCA dist sys: 78 cm/s
Left CCA prox dias: 22 cm/s
Left CCA prox sys: 130 cm/s
Left ICA dist dias: -20 cm/s
Left ICA dist sys: -102 cm/s
Left ICA prox dias: 25 cm/s
Left ICA prox sys: 105 cm/s
RIGHT VERTEBRAL DIAS: -30 cm/s
Right CCA prox dias: 18 cm/s
Right CCA prox sys: 155 cm/s
Right cca dist sys: -144 cm/s

## 2017-01-05 LAB — GLUCOSE, CAPILLARY
Glucose-Capillary: 141 mg/dL — ABNORMAL HIGH (ref 65–99)
Glucose-Capillary: 278 mg/dL — ABNORMAL HIGH (ref 65–99)
Glucose-Capillary: 320 mg/dL — ABNORMAL HIGH (ref 65–99)
Glucose-Capillary: 45 mg/dL — ABNORMAL LOW (ref 65–99)
Glucose-Capillary: 54 mg/dL — ABNORMAL LOW (ref 65–99)
Glucose-Capillary: 78 mg/dL (ref 65–99)

## 2017-01-05 LAB — ECHOCARDIOGRAM COMPLETE
Height: 62 in
Weight: 1836.8 oz

## 2017-01-05 LAB — TROPONIN I
Troponin I: <0.03 ng/mL (ref <0.03)
Troponin I: <0.03 ng/mL (ref <0.03)

## 2017-01-05 MED ORDER — SODIUM CHLORIDE 0.9 % IV SOLN: INTRAVENOUS | Status: AC

## 2017-01-05 MED ORDER — INSULIN ASPART 100 UNIT/ML ~~LOC~~ SOLN
2.0000 [IU] | Freq: Three times a day (TID) | SUBCUTANEOUS | Status: DC
  Administered 2017-01-05: 6 [IU] via SUBCUTANEOUS
  Administered 2017-01-06: 2 [IU] via SUBCUTANEOUS
  Administered 2017-01-07: 5 [IU] via SUBCUTANEOUS

## 2017-01-05 MED ORDER — OXYMETAZOLINE HCL 0.05 % NA SOLN
1.0000 | Freq: Every evening | NASAL | Status: DC | PRN
  Filled 2017-01-05: qty 15

## 2017-01-05 MED ORDER — ENOXAPARIN SODIUM 40 MG/0.4ML ~~LOC~~ SOLN
40.0000 mg | SUBCUTANEOUS | Status: DC
  Administered 2017-01-05 – 2017-01-06 (×2): 40 mg via SUBCUTANEOUS
  Filled 2017-01-05 (×2): qty 0.4

## 2017-01-05 NOTE — Progress Notes (Addendum)
PROGRESS NOTE                                                                                                                                                                                                             Patient Demographics:    Danielle Clarke, is a 76 y.o. female, DOB - 1941/10/04, ZOX:[id number]  Admit date - 01/04/2017   Admitting Physician Richarda Overlie, MD  Outpatient Primary MD for the patient is Gaye Alken, MD  LOS - 0  Chief Complaint  Patient presents with  . Fall  . Loss of Consciousness       Brief Narrative  76 yo F withi PMHx of T1DM, HTN, HLD, known carotid artery disease with 40-50% carotid stenosis, mild AS who presents with syncopal episode at 6 PM on 2/7. Patient has a history of recurrent syncope and is followed closely by Dr. Verdis Prime her cardiologist. She is also followed by Dr. Talmage Nap for her diabetes.   She was admitted to the hospital on 01/04/2017 for episode of syncope and collapse, she likely had hypoglycemia which is apparent from her history, she also incurred left-sided rib fracture. No seizure-like activity no chest pain or palpitations, no bowel or bladder incontinence. In the hospital she was found to be orthostatic.    Subjective:    Jamiya Nims today has, No headache, No chest pain, No abdominal pain - No Nausea, No new weakness tingling or numbness, No Cough - SOB.     Assessment  & Plan :     1.Syncope and collapse most likely due to orthostatic hypotension. Head CT unremarkable, no focal deficits, no seizure-like activity, carotid duplex nonacute, she does have history of aortic stenosis for which echocardiogram is being done will monitor. Continue to hydrate, TED stockings, PT eval. Telemetry monitor.  2. Bilateral carotid artery disease. Noncritical. Outpatient follow-up with vascular surgery, continue aspirin and statin for secondary  prevention.  3. Dyslipidemia. On statin continue.  4. Fall related left-sided rib fracture. Chest x-ray stable, I S, supportive care.  5. Pulmonary hypertension reported on last echocardiogram. Outpatient follow-up with PCP and pulmonary. Symptom-free.  6. DM type I. Follows with Dr. Talmage Nap - known to be a brittle diabetic, continue home regimen and monitor.  No results found for: HGBA1C CBG (last 3)   Recent Labs  01/04/17 1328 01/04/17  2214 01/05/17 0755  GLUCAP 219* 125* 141*    Discussed her case with daughter-in-law on 01/07/2017, she is a CNA. She informs me that patient has had a few falls at home which she has not told us about, note patient here did fine with PT and was cleared to go home with intermittent supervision with family will provide. However I informed the daughter-in-law that if the number of falls are accumulating then they should have some prominent supervision arrangements i.e. either patient should move in with family or the family should move in with patient.     Diet : Diet Heart Room service appropriate? Yes; Fluid consistency: Thin    Family Communication  :  None  Code Status :  Full  Disposition Plan  :  Home in am  Consults  :  None  Procedures  :    TTE -   Carotid US - Bilateral internal carotid arteries demonstrated a mild to moderate amount of plaque suggestive of less than 50% stenosis. Bilateral vertebral arteries demonstrated antegrade flow.  CT Head - Non acute  DVT Prophylaxis  :  Lovenox   Lab Results  Component Value Date   PLT 264 01/05/2017    Inpatient Medications  Scheduled Meds: . aspirin EC  81 mg Oral Daily  . enoxaparin (LOVENOX) injection  40 mg Subcutaneous Q24H  . insulin aspart  2-6 Units Subcutaneous See admin instructions  . insulin glargine  12 Units Subcutaneous BH-q7a  . simvastatin  40 mg Oral QPM  . sodium chloride flush  3 mL Intravenous Q12H  . traMADol  50 mg Oral BID   Continuous  Infusions: . sodium chloride     PRN Meds:.acetaminophen **OR** acetaminophen, hydrALAZINE, levalbuterol, ondansetron **OR** ondansetron (ZOFRAN) IV, oxyCODONE-acetaminophen, oxymetazoline  Antibiotics  :    Anti-infectives    None         Objective:   Vitals:   01/04/17 2024 01/05/17 0307 01/05/17 0538 01/05/17 1013  BP: (!) 143/48   (!) 159/44  Pulse: 73   70  Resp: 18   18  Temp: 98.2 F (36.8 C)  98.4 F (36.9 C) 98.3 F (36.8 C)  TempSrc: Oral  Oral Oral  SpO2: 100%  98% 97%  Weight:  52.1 kg (114 lb 12.8 oz)    Height:        Wt Readings from Last 3 Encounters:  01/05/17 52.1 kg (114 lb 12.8 oz)  10/06/16 52.1 kg (114 lb 12 oz)  10/06/15 50.8 kg (112 lb)     Intake/Output Summary (Last 24 hours) at 01/05/17 1036 Last data filed at 01/05/17 1018  Gross per 24 hour  Intake              240 ml  Output             2200 ml  Net            -1960 ml     Physical Exam  Awake Alert, Oriented X 3, No new F.N deficits, Normal affect [place].AT,PERRAL Supple Neck,No JVD, No cervical lymphadenopathy appriciated.  Symmetrical Chest wall movement, Good air movement bilaterally, CTAB RRR,No Gallops,Rubs, Mild mitral valve systolic murmur, No Parasternal Heave +ve B.Sounds, Abd Soft, No tenderness, No organomegaly appriciated, No rebound - guarding or rigidity. No Cyanosis, Clubbing or edema, No new Rash or bruise      Data Review:    CBC  Recent Labs Lab 01/04/17 1328 01/04/17 1820 01/05/17 0744  WBC 9.2 7.6  5.4  HGB 11.2* 10.6* 10.5*  HCT 34.7* 33.2* 32.5*  PLT 302 285 264  MCV 91.6 91.5 91.3  MCH 29.6 29.2 29.5  MCHC 32.3 31.9 32.3  RDW 14.3 14.2 14.4    Chemistries   Recent Labs Lab 01/04/17 1328 01/04/17 1820 01/05/17 0744  NA 137  --  139  K 4.9  --  4.5  CL 101  --  106  CO2 26  --  27  GLUCOSE 218*  --  149*  BUN 28*  --  17  CREATININE 1.00 0.91 0.95  CALCIUM 9.3  --  8.6*  MG  --  2.0  --   AST  --  21 18  ALT  --  19 16   ALKPHOS  --  62 59  BILITOT  --  0.3 0.6   ------------------------------------------------------------------------------------------------------------------ No results for input(s): CHOL, HDL, LDLCALC, TRIG, CHOLHDL, LDLDIRECT in the last 72 hours.  No results found for: HGBA1C ------------------------------------------------------------------------------------------------------------------  Recent Labs  01/04/17 1821  TSH 1.435   ------------------------------------------------------------------------------------------------------------------ No results for input(s): VITAMINB12, FOLATE, FERRITIN, TIBC, IRON, RETICCTPCT in the last 72 hours.  Coagulation profile No results for input(s): INR, PROTIME in the last 168 hours.  No results for input(s): DDIMER in the last 72 hours.  Cardiac Enzymes  Recent Labs Lab 01/04/17 1820 01/05/17 0049 01/05/17 0744  TROPONINI <0.03 <0.03 <0.03   ------------------------------------------------------------------------------------------------------------------ No results found for: BNP  Micro Results No results found for this or any previous visit (from the past 240 hour(s)).  Radiology Reports Dg Ribs Unilateral W/chest Left  Result Date: 01/04/2017 CLINICAL DATA:  Anterior and inferior left rib pain for 1 day since a fall due to hypoglycemia. EXAM: LEFT RIBS AND CHEST - 3+ VIEW COMPARISON:  Plain films chest and left ribs 01/12/2015. FINDINGS: Lungs are clear. Heart size normal. Aortic atherosclerosis is seen. Right shoulder replacement and postoperative change of cervical fusion are noted. There is thoracolumbar scoliosis. The patient has an acute fracture of the left tenth rib. The fracture underlies a marker placed in the region of concern. No other fracture is identified. IMPRESSION: Acute left tenth rib fracture. Negative for pneumothorax or acute cardiopulmonary disease. Atherosclerosis. Scoliosis. Electronically Signed   By: Drusilla Kanner M.D.   On: 01/04/2017 14:34   Ct Head Wo Contrast  Result Date: 01/04/2017 CLINICAL DATA:  Generalized weakness after syncopal episode last night. EXAM: CT HEAD WITHOUT CONTRAST TECHNIQUE: Contiguous axial images were obtained from the base of the skull through the vertex without intravenous contrast. COMPARISON:  10/07/2014 FINDINGS: Brain: There is no evidence of acute cortical infarct, intracranial hemorrhage, mass, midline shift, or extra-axial fluid collection. Ventricles and sulci are normal for age. Vascular: Calcified atherosclerosis at the skullbase. No hyperdense vessel. Skull: No fracture or focal osseous lesion. Sinuses/Orbits: Small right mastoid effusion. Clear paranasal sinuses. Prior bilateral cataract extraction. Other: None. IMPRESSION: No evidence of acute intracranial abnormality. Electronically Signed   By: Sebastian Ache M.D.   On: 01/04/2017 17:16    Time Spent in minutes  30   Tashiba Timoney K M.D on 01/05/2017 at 10:36 AM  Between 7am to 7pm - Pager - [phone]  After 7pm go to [email or web address] - password [facility]  [facility] -  Office  [phone]

## 2017-01-05 NOTE — Evaluation (Addendum)
Occupational Therapy Evaluation Patient Details Name: Danielle Clarke MRN: [id number] DOB: [birth year] Today's Date: 01/05/2017    History of Present Illness Pt is a 76 y/o female admitted secondary to syncopal episode at home. PMH including but not limited to DM type I, CAD, hx of L ankle fusion (wears lift in shoe), neck fx (05/2012) and anterior cervical corpectomy C4-C6 in 2015.   Clinical Impression   Pt admitted with the above diagnoses and presents with below problem list. Pt will benefit from continued acute OT to address the below listed deficits and maximize independence with basic ADLs prior to d/c home with family/friends providing intermittent assist. PTA pt was independent with ADLs. Pt is currently min guard for LB ADLs and tub shower transfer, setup with UB ADLs, supervision for toilet transfers and functional mobility.       Follow Up Recommendations  No OT follow up;Supervision - Intermittent    Equipment Recommendations  None recommended by OT    Recommendations for Other Services       Precautions / Restrictions Precautions Precautions: Fall Precaution Comments: L rib fx Restrictions Weight Bearing Restrictions: No      Mobility Bed Mobility Overal bed mobility: Modified Independent                Transfers Overall transfer level: Needs assistance Equipment used: None Transfers: Sit to/from Stand Sit to Stand: Supervision         General transfer comment: increased time, supervision for safety    Balance Overall balance assessment: Needs assistance Sitting-balance support: Feet supported Sitting balance-Leahy Scale: Good     Standing balance support: During functional activity;No upper extremity supported Standing balance-Leahy Scale: Fair                              ADL Overall ADL's : Needs assistance/impaired Eating/Feeding: Set up;Sitting   Grooming: Oral care;Supervision/safety;Standing   Upper Body Bathing:  Set up;Sitting   Lower Body Bathing: Min guard;Sit to/from stand   Upper Body Dressing : Set up;Sitting Upper Body Dressing Details (indicate cue type and reason): educated on technique and clothing choices to minimize discomfort on R flank. Lower Body Dressing: Min guard;Sit to/from stand Lower Body Dressing Details (indicate cue type and reason): donned socks EOB with setup assist. has shoe horn at home. Able to position foot on opposite knee to avoid bend forward.  Toilet Transfer: Supervision/safety;Ambulation;Comfort height toilet;Grab bars   Toileting- Clothing Manipulation and Hygiene: Supervision/safety;Sit to/from stand;Sitting/lateral lean   Tub/ Shower Transfer: Tub transfer;Min guard;Ambulation;Shower Field seismologist Details (indicate cue type and reason): Discussed need to have someone with her during tub shower transfers initially at d/c. Also recommended having grab bar installed. Functional mobility during ADLs: Supervision/safety General ADL Comments: Pt completed bed mobility, in-room functional mobility, oral care standing at sink, toilet transfer and anterior pericare as detailed above. Discussed ADL strategies, safety with home setup including what tasks family/friends need to help with at d/c. Encouraged incentive spirometer use. Also educated on fall prevention strategies.      Vision     Perception     Praxis      Pertinent Vitals/Pain Pain Assessment: Faces Faces Pain Scale: Hurts even more Pain Location: L flank Pain Descriptors / Indicators: Guarding;Grimacing Pain Intervention(s): Limited activity within patient's tolerance;Monitored during session;Repositioned;Other (comment) (RN gave med just prior to session. )     Hand Dominance     Extremity/Trunk Assessment Upper  Extremity Assessment Upper Extremity Assessment: Overall WFL for tasks assessed (increased rib pain with >75* R shoulder flexion)   Lower Extremity Assessment Lower Extremity  Assessment: Defer to PT evaluation LLE Deficits / Details: hx of L ankle fusion   Cervical / Trunk Assessment Cervical / Trunk Assessment: Other exceptions Cervical / Trunk Exceptions: hx of cervical fx with anterior cervical corpectomy C4-C6 in 2015   Communication Communication Communication: No difficulties   Cognition Arousal/Alertness: Awake/alert Behavior During Therapy: WFL for tasks assessed/performed Overall Cognitive Status: Within Functional Limits for tasks assessed                     General Comments    Upon OT arrival, pt sitting up in bed reporting sensation of abdominal spasms, which she initially called hiccups. "It happened after I ate that cream cheese." Pt reporting need for basin, no emesis but did utlilize spit cup.     Exercises       Shoulder Instructions      Home Living Family/patient expects to be discharged to:: Private residence Living Arrangements: Alone Available Help at Discharge: Family;Available PRN/intermittently;Friend(s) Type of Home: House Home Access: Stairs to enter Entergy CorporationEntrance Stairs-Number of Steps: 2 Entrance Stairs-Rails: None Home Layout: One level     Bathroom Shower/Tub: Chief Strategy OfficerTub/shower unit   Bathroom Toilet: Handicapped height     Home Equipment: Cane - single point;Wheelchair - Economistmanual;Shower seat;Walker - 2 wheels   Additional Comments: reports she has been using towel bar as grab bar for some steadyig assist with tub shower transfer. Discussed safety risk with this and recommended having someone install a grab bar for her.       Prior Functioning/Environment Level of Independence: Independent        Comments: Pt reports that she does not use her cane as she trips over it. Drives. "I've lived by myself for a long time."        OT Problem List: Impaired balance (sitting and/or standing);Decreased activity tolerance;Decreased knowledge of use of DME or AE;Decreased knowledge of precautions;Impaired UE functional  use;Pain   OT Treatment/Interventions: Self-care/ADL training;DME and/or AE instruction;Therapeutic activities;Patient/family education;Balance training    OT Goals(Current goals can be found in the care plan section) Acute Rehab OT Goals Patient Stated Goal: return home OT Goal Formulation: With patient Time For Goal Achievement: 01/12/17 Potential to Achieve Goals: Good ADL Goals Pt Will Perform Upper Body Dressing: with modified independence;sitting Pt Will Perform Lower Body Dressing: with modified independence;sit to/from stand Pt Will Perform Tub/Shower Transfer: Tub transfer;with supervision;ambulating;shower seat  OT Frequency: Min 2X/week   Barriers to D/C:            Co-evaluation              End of Session Nurse Communication: Other (comment);Mobility status (RN checked in on pt at start of session. )  Activity Tolerance: Patient limited by pain;Patient tolerated treatment well Patient left: in bed;with call bell/phone within reach;with bed alarm set;Other (comment) (no SCD's seen in room)   Time: [id number] OT Time Calculation (min): 30 min Charges:  OT General Charges $OT Visit: 1 Procedure OT Evaluation $OT Eval Low Complexity: 1 Procedure OT Treatments $Self Care/Home Management : 8-22 mins G-Codes: OT G-codes **NOT FOR INPATIENT CLASS** Functional Assessment Tool Used: clinical judgement Functional Limitation: Self care Self Care Current Status ([id number]): At least 1 percent but less than 20 percent impaired, limited or restricted Self Care Goal Status ([id number](G8988): At least 1 percent but less than  20 percent impaired, limited or restricted  Pilar Grammes 01/05/2017, 1:30 PM

## 2017-01-05 NOTE — Progress Notes (Signed)
  Echocardiogram 2D Echocardiogram has been performed.  Danielle Clarke, Danielle Clarke 01/05/2017, 2:52 PM

## 2017-01-05 NOTE — Evaluation (Signed)
Physical Therapy Evaluation Patient Details Name: Danielle Clarke MRN: [id number] DOB: August 21, 1941 Today's Date: 01/05/2017   History of Present Illness  Pt is a 76 y/o female admitted secondary to syncopal episode at home. PMH including but not limited to DM type I, CAD, hx of L ankle fusion (wears lift in shoe), neck fx (05/2012) and anterior cervical corpectomy C4-C6 in 2015.  Clinical Impression  Pt presented supine in bed with HOB elevated, awake and willing to participate in therapy session. Prior to admission, pt reported that she is independent with all functional mobility, ADLs and very active. Pt lives alone but has family and friends close by that can provide assistance if ever needed. Pt currently ambulating with one UE support on IV pole and mildly unsteady gait but no LOB. Pt would continue to benefit from skilled physical therapy services at this time while admitted to address her below listed limitations in order to improve her overall safety and independence with functional mobility.       Follow Up Recommendations No PT follow up;Supervision - Intermittent    Equipment Recommendations  None recommended by PT    Recommendations for Other Services       Precautions / Restrictions Precautions Precautions: Fall Restrictions Weight Bearing Restrictions: No      Mobility  Bed Mobility Overal bed mobility: Modified Independent                Transfers Overall transfer level: Needs assistance Equipment used: None Transfers: Sit to/from Stand Sit to Stand: Supervision         General transfer comment: increased time, supervision for safety  Ambulation/Gait Ambulation/Gait assistance: Supervision Ambulation Distance (Feet): 300 Feet Assistive device:  (pushing IV pole) Gait Pattern/deviations: Step-through pattern;Decreased step length - right;Decreased step length - left;Decreased stride length;Trunk flexed;Drifts right/left;Decreased dorsiflexion -  left Gait velocity: decreased Gait velocity interpretation: Below normal speed for age/gender General Gait Details: mildly unsteady gait with no LOB, supervision for safety; pt with no active DF on L LE secondary to hx of ankle fusion  Stairs            Wheelchair Mobility    Modified Rankin (Stroke Patients Only)       Balance Overall balance assessment: Needs assistance Sitting-balance support: Feet supported Sitting balance-Leahy Scale: Good     Standing balance support: During functional activity;No upper extremity supported Standing balance-Leahy Scale: Fair                               Pertinent Vitals/Pain Pain Assessment: Faces Faces Pain Scale: Hurts little more Pain Location: L flank Pain Descriptors / Indicators: Guarding Pain Intervention(s): Monitored during session;Repositioned    Home Living Family/patient expects to be discharged to:: Private residence Living Arrangements: Alone Available Help at Discharge: Family;Available PRN/intermittently;Friend(s) Type of Home: House Home Access: Stairs to enter Entrance Stairs-Rails: None Entrance Stairs-Number of Steps: 2 Home Layout: One level Home Equipment: Cane - single point;Wheelchair - Economist - 2 wheels      Prior Function Level of Independence: Independent         Comments: Pt reports that she does not use her cane as she trips over it     Hand Dominance        Extremity/Trunk Assessment   Upper Extremity Assessment Upper Extremity Assessment: Overall WFL for tasks assessed    Lower Extremity Assessment Lower Extremity Assessment: LLE deficits/detail LLE Deficits / Details:  hx of L ankle fusion    Cervical / Trunk Assessment Cervical / Trunk Assessment: Other exceptions Cervical / Trunk Exceptions: hx of cervical fx with anterior cervical corpectomy C4-C6 in 2015  Communication   Communication: No difficulties  Cognition Arousal/Alertness:  Awake/alert Behavior During Therapy: WFL for tasks assessed/performed Overall Cognitive Status: Within Functional Limits for tasks assessed                      General Comments      Exercises     Assessment/Plan    PT Assessment Patient needs continued PT services  PT Problem List Decreased balance;Decreased mobility;Decreased coordination;Decreased knowledge of use of DME;Decreased safety awareness;Cardiopulmonary status limiting activity;Pain          PT Treatment Interventions DME instruction;Gait training;Stair training;Functional mobility training;Therapeutic activities;Therapeutic exercise;Balance training;Neuromuscular re-education;Patient/family education    PT Goals (Current goals can be found in the Care Plan section)  Acute Rehab PT Goals Patient Stated Goal: return home PT Goal Formulation: With patient Time For Goal Achievement: 01/19/17 Potential to Achieve Goals: Good    Frequency Min 3X/week   Barriers to discharge        Co-evaluation               End of Session Equipment Utilized During Treatment: Gait belt Activity Tolerance: Patient tolerated treatment well Patient left: in bed;with call bell/phone within reach;with bed alarm set Nurse Communication: Mobility status    Functional Assessment Tool Used: clinical judgement Functional Limitation: Mobility: Walking and moving around Mobility: Walking and Moving Around Current Status ([id number]([id number]): At least 1 percent but less than 20 percent impaired, limited or restricted Mobility: Walking and Moving Around Goal Status [phone](G8979): 0 percent impaired, limited or restricted    Time: 0830-0902 PT Time Calculation (min) (ACUTE ONLY): 32 min   Charges:   PT Evaluation $PT Eval Low Complexity: 1 Procedure PT Treatments $Gait Training: 8-22 mins   PT G Codes:   PT G-Codes **NOT FOR INPATIENT CLASS** Functional Assessment Tool Used: clinical judgement Functional Limitation: Mobility: Walking  and moving around Mobility: Walking and Moving Around Current Status ([id number](G8978): At least 1 percent but less than 20 percent impaired, limited or restricted Mobility: Walking and Moving Around Goal Status [phone](G8979): 0 percent impaired, limited or restricted    H[facility] Mcgu[facility] Va Medical CenterJennifer M Raphel Stickles 01/05/2017, 9:37 AM Deborah ChalkJennifer Amara Manalang, PT, DPT [phone]

## 2017-01-05 NOTE — Progress Notes (Signed)
Preliminary results by tech - Carotid Duplex Completed. Bilateral internal carotid arteries demonstrated a mild to moderate amount of plaque suggestive of less than 50% stenosis. Bilateral vertebral arteries demonstrated antegrade flow. Marilynne Halstedita Adelisa Satterwhite, BS, RDMS, RVT

## 2017-01-06 ENCOUNTER — Observation Stay (HOSPITAL_COMMUNITY): Payer: Medicare Other

## 2017-01-06 DIAGNOSIS — I779 Disorder of arteries and arterioles, unspecified: Secondary | ICD-10-CM | POA: Diagnosis not present

## 2017-01-06 DIAGNOSIS — R55 Syncope and collapse: Secondary | ICD-10-CM | POA: Diagnosis not present

## 2017-01-06 LAB — GLUCOSE, CAPILLARY
Glucose-Capillary: 163 mg/dL — ABNORMAL HIGH (ref 65–99)
Glucose-Capillary: 128 mg/dL — ABNORMAL HIGH (ref 65–99)
Glucose-Capillary: 269 mg/dL — ABNORMAL HIGH (ref 65–99)
Glucose-Capillary: 171 mg/dL — ABNORMAL HIGH (ref 65–99)
Glucose-Capillary: 338 mg/dL — ABNORMAL HIGH (ref 65–99)

## 2017-01-06 LAB — HEMOGLOBIN A1C
Hgb A1c MFr Bld: 7.9 % — ABNORMAL HIGH (ref 4.8–5.6)
Mean Plasma Glucose: 180 mg/dL

## 2017-01-06 MED ORDER — SORBITOL 70 % SOLN
960.0000 mL | TOPICAL_OIL | Freq: Once | ORAL | Status: DC
  Filled 2017-01-06: qty 240

## 2017-01-06 MED ORDER — MAGNESIUM CITRATE PO SOLN
1.0000 | Freq: Once | ORAL | Status: DC
  Filled 2017-01-06: qty 296

## 2017-01-06 MED ORDER — BISACODYL 10 MG RE SUPP
10.0000 mg | Freq: Every day | RECTAL | Status: DC | PRN
Start: 2017-01-06 — End: 2017-01-07
  Administered 2017-01-06: 10 mg via RECTAL
  Filled 2017-01-06: qty 1

## 2017-01-06 MED ORDER — SODIUM CHLORIDE 0.9 % IV SOLN
8.0000 mg | Freq: Once | INTRAVENOUS | Status: AC
  Administered 2017-01-06: 8 mg via INTRAVENOUS
  Filled 2017-01-06: qty 4

## 2017-01-06 MED ORDER — DOCUSATE SODIUM 100 MG PO CAPS
200.0000 mg | ORAL_CAPSULE | Freq: Two times a day (BID) | ORAL | Status: DC
  Administered 2017-01-06 – 2017-01-07 (×3): 200 mg via ORAL
  Filled 2017-01-06 (×3): qty 2

## 2017-01-06 NOTE — Progress Notes (Signed)
Patient constipated colace given. Patient c/o of vomiting Zofran 8 mg IV given. Patient refusing insulin coverage. Patient unable to tolerate magnesium citrate and enema. MD notified. New orders noted.

## 2017-01-06 NOTE — Discharge Summary (Addendum)
Saul FordyceJacqueline J Vanzanten ZOX:[id number] DOB: [birth year] DOA: 01/04/2017  PCP: Gaye AlkenBARNES,[name] STEWART, MD  Admit date: 01/04/2017  Discharge date: 01/07/2017  Admitted From: Home   Disposition:  Home   Recommendations for Outpatient Follow-up:   Follow up with PCP in 1-2 weeks  PCP Please obtain BMP/CBC, 2 view CXR in 1week,  (see Discharge instructions)   PCP Please follow up on the following pending results: None   Home Health: none   Equipment/Devices: none  Consultations: none Discharge Condition: Stable   CODE STATUS: Full   Diet Recommendation: Low Carb Heart Healthy    Chief Complaint  Patient presents with  . Fall  . Loss of Consciousness     Brief history of present illness from the day of admission and additional interim summary    76 yo F withi PMHx of T1DM, HTN, HLD, known carotid artery diseasewith 40-50% carotid stenosis,mildAS who presents with syncopal episodeat 6 PM on 2/7. Patient has a history of recurrent syncope and is followed closely by Dr. Verdis PrimeHenry Smith her cardiologist. She is also followed by Dr. Talmage NapBalan for her diabetes.   She was admitted to the hospital on 01/04/2017 for episode of syncope and collapse, she likely had hypoglycemia which is apparent from her history, she also incurred left-sided rib fracture. No seizure-like activity no chest pain or palpitations, no bowel or bladder incontinence. In the hospital she was found to be orthostatic.                                                                 Hospital Course    1.Syncope and collapse most likely due to orthostatic hypotension. Head CT unremarkable, no focal deficits, no seizure-like activity, carotid duplex nonacute, Stable echocardiogram, she was hydrated, orthostatic hypotension has resolved, TED stockings have been  applied. She is now symptom-free able to ambulate without any problems, evaluated by PT. Will be discharged home with follow-up with PCP.  2. Bilateral carotid artery disease. Noncritical. Outpatient follow-up with vascular surgery, continue aspirin and statin for secondary prevention. Request PCP to arrange for outpatient vascular follow-up.  3. Dyslipidemia. On statin continue.  4. Fall related left-sided rib fracture. Chest x-ray stable, I S, supportive care.  5. Pulmonary hypertension reported on last echocardiogram. Outpatient follow-up with PCP and pulmonary. Symptom-free. PCP to arrange for outpatient pulmonary follow-up.  6. DM type I. Follows with Dr. Talmage NapBalan - known to be a brittle diabetic, continue home regimen and monitor.  7. Obstipation induced nausea. Abdominal exam benign, x-ray reveals moderate stool boredom, patient refused enema, she is relatively symptom free except for mild nausea, passing gas, placed on bowel regimen will be discharged home. PCP to follow and monitor.   Discharge diagnosis     Principal Problem:   Syncope Active Problems:   Diabetes mellitus type  1 (HCC)   Bilateral carotid artery disease (HCC)   CKD (chronic kidney disease) stage 2, GFR 60-89 ml/min   Essential hypertension, benign   Hyperlipidemia    Discharge instructions    Discharge Instructions    Discharge instructions    Complete by:  As directed    Follow with Primary MD Gaye Alken, MD in 7 days   Get CBC, CMP, 2 view Chest X ray checked  by Primary MD or SNF MD in 5-7 days ( we routinely change or add medications that can affect your baseline labs and fluid status, therefore we recommend that you get the mentioned basic workup next visit with your PCP, your PCP may decide not to get them or add new tests based on their clinical decision)  Activity: As tolerated with Full fall precautions use walker/cane & assistance as needed  Disposition Home    Diet:  Heart  Healthy Low Carb  Accuchecks 4 times/day, Once in AM empty stomach and then before each meal. Log in all results and show them to your Prim.MD in 3 days. If any glucose reading is under 80 or above 300 call your Prim MD immidiately. Follow Low glucose instructions for glucose under 80 as instructed.   For Heart failure patients - Check your Weight same time everyday, if you gain over 2 pounds, or you develop in leg swelling, experience more shortness of breath or chest pain, call your Primary MD immediately. Follow Cardiac Low Salt Diet and 1.5 lit/day fluid restriction.  On your next visit with your primary care physician please Get Medicines reviewed and adjusted.  Please request your Prim.MD to go over all Hospital Tests and Procedure/Radiological results at the follow up, please get all Hospital records sent to your Prim MD by signing hospital release before you go home.  If you experience worsening of your admission symptoms, develop shortness of breath, life threatening emergency, suicidal or homicidal thoughts you must seek medical attention immediately by calling 911 or calling your MD immediately  if symptoms less severe.  You Must read complete instructions/literature along with all the possible adverse reactions/side effects for all the Medicines you take and that have been prescribed to you. Take any new Medicines after you have completely understood and accpet all the possible adverse reactions/side effects.   Do not drive, operate heavy machinery, perform activities at heights, swimming or participation in water activities or provide baby sitting services if your were admitted for syncope or siezures until you have seen by Primary MD or a Neurologist and advised to do so again.  Do not drive when taking Pain medications.    Do not take more than prescribed Pain, Sleep and Anxiety Medications  Special Instructions: If you have smoked or chewed Tobacco  in the last 2 yrs please  stop smoking, stop any regular Alcohol  and or any Recreational drug use.  Wear Seat belts while driving.   Please note  You were cared for by a hospitalist during your hospital stay. If you have any questions about your discharge medications or the care you received while you were in the hospital after you are discharged, you can call the unit and asked to speak with the hospitalist on call if the hospitalist that took care of you is not available. Once you are discharged, your primary care physician will handle any further medical issues. Please note that NO REFILLS for any discharge medications will be authorized once you are discharged, as it is imperative  that you return to your primary care physician (or establish a relationship with a primary care physician if you do not have one) for your aftercare needs so that they can reassess your need for medications and monitor your lab values.   Discharge patient    Complete by:  As directed    Discharge disposition:  01-Home or Self Care   Discharge patient date:  01/07/2017   Increase activity slowly    Complete by:  As directed       Discharge Medications   Allergies as of 01/07/2017      Reactions   Cefaclor Anaphylaxis   Tetracycline Anaphylaxis   Vancomycin Anaphylaxis   Augmentin [amoxicillin-pot Clavulanate] Other (See Comments)   Reaction unknown   Prednisone Other (See Comments)   Reaction unknown      Medication List    TAKE these medications   acetaminophen 500 MG tablet Commonly known as:  TYLENOL Take 500 mg by mouth 3 (three) times daily.   aspirin EC 81 MG tablet Take 81 mg by mouth daily.   BASAGLAR KWIKPEN 100 UNIT/ML Sopn Inject 12 Units into the skin every morning.   cholecalciferol 1000 units tablet Commonly known as:  VITAMIN D Take 1,000 Units by mouth daily.   Chromium Picolinate 1000 MCG Tabs Take 1,000 mcg by mouth daily.   cycloSPORINE 0.05 % ophthalmic emulsion Commonly known as:   RESTASIS Place 1 drop into both eyes 2 (two) times daily.   diclofenac 75 MG EC tablet Commonly known as:  VOLTAREN Take 75 mg by mouth daily.   docusate sodium 100 MG capsule Commonly known as:  COLACE Take 2 capsules (200 mg total) by mouth 2 (two) times daily.   glucose blood test strip 1 each by Other route as needed for other (USE TO TEST YOUR BLOOD SUGAR UP TO 8 TIMES DAILY AS NEEDED). Use as instructed   insulin aspart 100 UNIT/ML injection Commonly known as:  novoLOG Inject 2-6 Units into the skin See admin instructions. Sliding scale. Base= 2 units-Reading below 80 subtract 1; 80-150 take 2 units (base dose); 151-200 add 1 unit; 201-250 add 2 units; 251-300 add 3 units; 301-350 add 4 units; 351-400 add 5 units; 401-450 add 6 units   Magnesium 250 MG Tabs Take 250 mg by mouth daily.   neomycin-polymyxin-dexameth 0.1 % Oint Commonly known as:  MAXITROL Place 1 application into the right eye at bedtime.   ondansetron 4 MG tablet Commonly known as:  ZOFRAN Take 1 tablet (4 mg total) by mouth every 8 (eight) hours as needed for nausea or vomiting.   Pen Needles 31G X 8 MM Misc by Does not apply route as needed (USE TO INJECT INSULIN INTO THE SKIN AS DIRECTED TWICE DAILY).   polyethylene glycol packet Commonly known as:  MIRALAX / GLYCOLAX Take 17 g by mouth 2 (two) times daily.   simvastatin 40 MG tablet Commonly known as:  ZOCOR Take 40 mg by mouth every evening.   SYSTANE OP Place 1 drop into both eyes daily as needed (dry eyes).   traMADol 50 MG tablet Commonly known as:  ULTRAM Take 50 mg by mouth 2 (two) times daily.   Turmeric 500 MG Caps Take 500 mg by mouth daily.   VENTOLIN HFA 108 (90 Base) MCG/ACT inhaler Generic drug:  albuterol Inhale 1-2 puffs into the lungs every 4 (four) hours as needed for wheezing or shortness of breath.   WOMENS MULTIVITAMIN PLUS PO Take 1 tablet by  mouth daily.       Follow-up Information    Gaye Alken, MD. Schedule an appointment as soon as possible for a visit in 1 week(s).   Specialty:  Family Medicine Contact information: [address] [place] [address] [phone]           Major procedures and Radiology Reports - PLEASE review detailed and final reports thoroughly  -     TTE - Left ventricle: The cavity size was normal. Systolic function was normal. The estimated ejection fraction was in the range of 60% to 65%. Wall motion was normal; there were no regional wall motion abnormalities. Doppler parameters are consistent with abnormal left ventricular relaxation (grade 1 diastolic dysfunction). There was no evidence of elevated ventricular filling pressure by Doppler parameters. - Aortic valve: Valve mobility was restricted. There was mild stenosis. Mean gradient (S): 15 mm Hg. Peak gradient (S): 23 mm Hg. Valve area (VTI): 0.88 cm^2. Valve area (Vmax): 0.87 cm^2.  Valve area (Vmean): 0.84 cm^2. - Aortic root: The aortic root was normal in size. - Mitral valve: Calcified annulus. Mildly thickened leaflets . There was mild regurgitation. - Right ventricle: Systolic function was normal. - Tricuspid valve: There was moderate regurgitation. - Pulmonary arteries: Systolic pressure was moderately increased.  PA peak pressure: 46 mm Hg (S). - Inferior vena cava: The vessel was normal in size. - Pericardium, extracardiac: There was no pericardial effusion.  Carotid US - Bilateral internal carotid arteries demonstrated a mild to moderate amount of plaque suggestive of less than 50% stenosis. Bilateral vertebral arteries demonstrated antegrade flow.  CT Head - Non acute   Dg Ribs Unilateral W/chest Left  Result Date: 01/04/2017 CLINICAL DATA:  Anterior and inferior left rib pain for 1 day since a fall due to hypoglycemia. EXAM: LEFT RIBS AND CHEST - 3+ VIEW COMPARISON:  Plain films chest and left ribs 01/12/2015. FINDINGS: Lungs are clear. Heart size normal. Aortic  atherosclerosis is seen. Right shoulder replacement and postoperative change of cervical fusion are noted. There is thoracolumbar scoliosis. The patient has an acute fracture of the left tenth rib. The fracture underlies a marker placed in the region of concern. No other fracture is identified. IMPRESSION: Acute left tenth rib fracture. Negative for pneumothorax or acute cardiopulmonary disease. Atherosclerosis. Scoliosis. Electronically Signed   By: Drusilla Kanner M.D.   On: 01/04/2017 14:34   Ct Head Wo Contrast  Result Date: 01/04/2017 CLINICAL DATA:  Generalized weakness after syncopal episode last night. EXAM: CT HEAD WITHOUT CONTRAST TECHNIQUE: Contiguous axial images were obtained from the base of the skull through the vertex without intravenous contrast. COMPARISON:  10/07/2014 FINDINGS: Brain: There is no evidence of acute cortical infarct, intracranial hemorrhage, mass, midline shift, or extra-axial fluid collection. Ventricles and sulci are normal for age. Vascular: Calcified atherosclerosis at the skullbase. No hyperdense vessel. Skull: No fracture or focal osseous lesion. Sinuses/Orbits: Small right mastoid effusion. Clear paranasal sinuses. Prior bilateral cataract extraction. Other: None. IMPRESSION: No evidence of acute intracranial abnormality. Electronically Signed   By: Sebastian Ache M.D.   On: 01/04/2017 17:16   Dg Abd Portable 1v  Result Date: 01/07/2017 CLINICAL DATA:  Nausea EXAM: PORTABLE ABDOMEN - 1 VIEW COMPARISON:  01/06/2017 FINDINGS: Moderate stool burden throughout the colon. Nonobstructive bowel gas pattern. No free air organomegaly. Severe leftward scoliosis of the lumbar spine and advanced degenerative changes. IMPRESSION: Moderate stool burden.  No acute findings. Electronically Signed   By: Charlett Nose M.D.  On: 01/07/2017 07:57   Dg Abd Portable 1v  Result Date: 01/06/2017 CLINICAL DATA:  Vomiting. EXAM: PORTABLE ABDOMEN - 1 VIEW COMPARISON:  July 25, 2016  FINDINGS: Scoliotic curvature of the lumbar spine. Fecal loading in the right colon. No bowel obstruction identified. IMPRESSION: No cause for vomiting identified. Electronically Signed   By: Gerome Sam III M.D   On: 01/06/2017 14:03    Micro Results     No results found for this or any previous visit (from the past 240 hour(s)).  Today   Subjective    Mahli Glahn today has no headache,no chest abdominal pain,no new weakness tingling or numbness, feels much better wants to go home today.     Objective   Blood pressure (!) 130/44, pulse 73, temperature 98.8 F (37.1 C), temperature source Oral, resp. rate 19, height 5\' 2"  (1.575 m), weight 52.6 kg (116 lb), SpO2 95 %.   Intake/Output Summary (Last 24 hours) at 01/07/17 1038 Last data filed at 01/07/17 0020  Gross per 24 hour  Intake              360 ml  Output             1501 ml  Net            -1141 ml    Exam Awake Alert, Oriented x 3, No new F.N deficits, Normal affect [place].AT,PERRAL Supple Neck,No JVD, No cervical lymphadenopathy appriciated.  Symmetrical Chest wall movement, Good air movement bilaterally, CTAB RRR,No Gallops,Rubs or new Murmurs, No Parasternal Heave +ve B.Sounds, Abd Soft, Non tender, No organomegaly appriciated, No rebound -guarding or rigidity. No Cyanosis, Clubbing or edema, No new Rash or bruise   Data Review   CBC w Diff:  Lab Results  Component Value Date   WBC 5.4 01/05/2017   HGB 10.5 (L) 01/05/2017   HCT 32.5 (L) 01/05/2017   PLT 264 01/05/2017   LYMPHOPCT 31 10/07/2014   BANDSPCT  04/08/2013    THIS TEST WAS ORDERED IN ERROR AND HAS BEEN CREDITED.   MONOPCT 8 10/07/2014   EOSPCT 8 (H) 10/07/2014   BASOPCT 2 (H) 10/07/2014    CMP:  Lab Results  Component Value Date   NA 139 01/05/2017   K 4.5 01/05/2017   CL 106 01/05/2017   CO2 27 01/05/2017   BUN 17 01/05/2017   CREATININE 0.95 01/05/2017   PROT 6.0 (L) 01/05/2017   ALBUMIN 3.4 (L) 01/05/2017   BILITOT 0.6  01/05/2017   ALKPHOS 59 01/05/2017   AST 18 01/05/2017   ALT 16 01/05/2017  . Lab Results  Component Value Date   HGBA1C 7.9 (H) 01/04/2017   CBG (last 3)   Recent Labs  01/06/17 2127 01/07/17 0208 01/07/17 0810  GLUCAP 338* 275* 281*      Total Time in preparing paper work, data evaluation and todays exam - 35 minutes  Leroy Sea M.D on 01/07/2017 at 10:38 AM  [facility]   Office  [phone]

## 2017-01-06 NOTE — Discharge Instructions (Signed)
Follow with Primary MD Gaye AlkenBARNES,[name] STEWART, MD in 7 days   Get CBC, CMP, 2 view Chest X ray checked  by Primary MD or SNF MD in 5-7 days ( we routinely change or add medications that can affect your baseline labs and fluid status, therefore we recommend that you get the mentioned basic workup next visit with your PCP, your PCP may decide not to get them or add new tests based on their clinical decision)  Activity: As tolerated with Full fall precautions use walker/cane & assistance as needed  Disposition Home    Diet:  Heart Healthy Low carb  Accuchecks 4 times/day, Once in AM empty stomach and then before each meal. Log in all results and show them to your Prim.MD in 3 days. If any glucose reading is under 80 or above 300 call your Prim MD immidiately. Follow Low glucose instructions for glucose under 80 as instructed.   For Heart failure patients - Check your Weight same time everyday, if you gain over 2 pounds, or you develop in leg swelling, experience more shortness of breath or chest pain, call your Primary MD immediately. Follow Cardiac Low Salt Diet and 1.5 lit/day fluid restriction.  On your next visit with your primary care physician please Get Medicines reviewed and adjusted.  Please request your Prim.MD to go over all Hospital Tests and Procedure/Radiological results at the follow up, please get all Hospital records sent to your Prim MD by signing hospital release before you go home.  If you experience worsening of your admission symptoms, develop shortness of breath, life threatening emergency, suicidal or homicidal thoughts you must seek medical attention immediately by calling 911 or calling your MD immediately  if symptoms less severe.  You Must read complete instructions/literature along with all the possible adverse reactions/side effects for all the Medicines you take and that have been prescribed to you. Take any new Medicines after you have completely understood and  accpet all the possible adverse reactions/side effects.   Do not drive, operate heavy machinery, perform activities at heights, swimming or participation in water activities or provide baby sitting services if your were admitted for syncope or siezures until you have seen by Primary MD or a Neurologist and advised to do so again.  Do not drive when taking Pain medications.    Do not take more than prescribed Pain, Sleep and Anxiety Medications  Special Instructions: If you have smoked or chewed Tobacco  in the last 2 yrs please stop smoking, stop any regular Alcohol  and or any Recreational drug use.  Wear Seat belts while driving.   Please note  You were cared for by a hospitalist during your hospital stay. If you have any questions about your discharge medications or the care you received while you were in the hospital after you are discharged, you can call the unit and asked to speak with the hospitalist on call if the hospitalist that took care of you is not available. Once you are discharged, your primary care physician will handle any further medical issues. Please note that NO REFILLS for any discharge medications will be authorized once you are discharged, as it is imperative that you return to your primary care physician (or establish a relationship with a primary care physician if you do not have one) for your aftercare needs so that they can reassess your need for medications and monitor your lab values.

## 2017-01-06 NOTE — Progress Notes (Signed)
Patient vomiting brown emesis with food particles. Zofran 4 mg PO given. MD notified.

## 2017-01-07 ENCOUNTER — Observation Stay (HOSPITAL_COMMUNITY): Payer: Medicare Other

## 2017-01-07 DIAGNOSIS — R55 Syncope and collapse: Secondary | ICD-10-CM | POA: Diagnosis not present

## 2017-01-07 LAB — GLUCOSE, CAPILLARY
Glucose-Capillary: 275 mg/dL — ABNORMAL HIGH (ref 65–99)
Glucose-Capillary: 281 mg/dL — ABNORMAL HIGH (ref 65–99)

## 2017-01-07 MED ORDER — BISACODYL 10 MG RE SUPP
10.0000 mg | Freq: Every day | RECTAL | Status: DC
  Filled 2017-01-07: qty 1

## 2017-01-07 MED ORDER — DOCUSATE SODIUM 100 MG PO CAPS: 200.0000 mg | ORAL_CAPSULE | Freq: Two times a day (BID) | ORAL | 0 refills | Status: DC

## 2017-01-07 MED ORDER — POLYETHYLENE GLYCOL 3350 17 G PO PACK
17.0000 g | PACK | Freq: Three times a day (TID) | ORAL | Status: DC
  Administered 2017-01-07: 17 g via ORAL
  Filled 2017-01-07: qty 1

## 2017-01-07 MED ORDER — POLYETHYLENE GLYCOL 3350 17 G PO PACK: 17.0000 g | PACK | Freq: Two times a day (BID) | ORAL | 0 refills | Status: DC

## 2017-01-07 MED ORDER — ONDANSETRON HCL 4 MG PO TABS: 4.0000 mg | ORAL_TABLET | Freq: Three times a day (TID) | ORAL | 0 refills | Status: DC | PRN

## 2017-01-07 NOTE — Progress Notes (Signed)
Patient is discharge to home  at 11:35 am accompanied by patient's daughter , spouse  and RN via wheelchair. Discharge instructions given . Patient verbalizes understanding. All personal belongings given. Telemetry box and IV removed prior to discharge and site in good condition.

## 2017-03-02 ENCOUNTER — Other Ambulatory Visit: Payer: Self-pay | Admitting: Gynecology

## 2017-03-02 DIAGNOSIS — Z1231 Encounter for screening mammogram for malignant neoplasm of breast: Secondary | ICD-10-CM

## 2017-03-21 ENCOUNTER — Ambulatory Visit: Payer: Medicare Other

## 2017-08-05 ENCOUNTER — Encounter (HOSPITAL_COMMUNITY): Payer: Self-pay | Admitting: Obstetrics and Gynecology

## 2017-08-05 ENCOUNTER — Emergency Department (HOSPITAL_COMMUNITY): Payer: Medicare Other

## 2017-08-05 ENCOUNTER — Inpatient Hospital Stay (HOSPITAL_COMMUNITY)
Admission: EM | Admit: 2017-08-05 | Discharge: 2017-08-08 | DRG: 202 | Disposition: A | Discharge status: Home / Self Care | Payer: Medicare Other | Attending: Internal Medicine | Admitting: Internal Medicine

## 2017-08-05 DIAGNOSIS — N182 Chronic kidney disease, stage 2 (mild): Secondary | ICD-10-CM | POA: Diagnosis present

## 2017-08-05 DIAGNOSIS — R9431 Abnormal electrocardiogram [ECG] [EKG]: Secondary | ICD-10-CM | POA: Diagnosis not present

## 2017-08-05 DIAGNOSIS — J45909 Unspecified asthma, uncomplicated: Secondary | ICD-10-CM | POA: Diagnosis present

## 2017-08-05 DIAGNOSIS — H409 Unspecified glaucoma: Secondary | ICD-10-CM | POA: Diagnosis present

## 2017-08-05 DIAGNOSIS — R0600 Dyspnea, unspecified: Secondary | ICD-10-CM | POA: Diagnosis not present

## 2017-08-05 DIAGNOSIS — E875 Hyperkalemia: Secondary | ICD-10-CM | POA: Diagnosis present

## 2017-08-05 DIAGNOSIS — R0902 Hypoxemia: Secondary | ICD-10-CM | POA: Diagnosis present

## 2017-08-05 DIAGNOSIS — Z96641 Presence of right artificial hip joint: Secondary | ICD-10-CM | POA: Diagnosis present

## 2017-08-05 DIAGNOSIS — E1022 Type 1 diabetes mellitus with diabetic chronic kidney disease: Secondary | ICD-10-CM | POA: Diagnosis present

## 2017-08-05 DIAGNOSIS — Z961 Presence of intraocular lens: Secondary | ICD-10-CM | POA: Diagnosis present

## 2017-08-05 DIAGNOSIS — Z96611 Presence of right artificial shoulder joint: Secondary | ICD-10-CM | POA: Diagnosis present

## 2017-08-05 DIAGNOSIS — J209 Acute bronchitis, unspecified: Principal | ICD-10-CM | POA: Diagnosis present

## 2017-08-05 DIAGNOSIS — R7989 Other specified abnormal findings of blood chemistry: Secondary | ICD-10-CM

## 2017-08-05 DIAGNOSIS — I361 Nonrheumatic tricuspid (valve) insufficiency: Secondary | ICD-10-CM | POA: Diagnosis not present

## 2017-08-05 DIAGNOSIS — R0602 Shortness of breath: Secondary | ICD-10-CM | POA: Diagnosis present

## 2017-08-05 DIAGNOSIS — R778 Other specified abnormalities of plasma proteins: Secondary | ICD-10-CM

## 2017-08-05 DIAGNOSIS — I2510 Atherosclerotic heart disease of native coronary artery without angina pectoris: Secondary | ICD-10-CM | POA: Diagnosis present

## 2017-08-05 DIAGNOSIS — E119 Type 2 diabetes mellitus without complications: Secondary | ICD-10-CM | POA: Diagnosis not present

## 2017-08-05 DIAGNOSIS — I272 Pulmonary hypertension, unspecified: Secondary | ICD-10-CM | POA: Diagnosis present

## 2017-08-05 DIAGNOSIS — Z79899 Other long term (current) drug therapy: Secondary | ICD-10-CM

## 2017-08-05 DIAGNOSIS — M1990 Unspecified osteoarthritis, unspecified site: Secondary | ICD-10-CM | POA: Diagnosis present

## 2017-08-05 DIAGNOSIS — Z888 Allergy status to other drugs, medicaments and biological substances status: Secondary | ICD-10-CM | POA: Diagnosis not present

## 2017-08-05 DIAGNOSIS — D649 Anemia, unspecified: Secondary | ICD-10-CM | POA: Diagnosis present

## 2017-08-05 DIAGNOSIS — Z9842 Cataract extraction status, left eye: Secondary | ICD-10-CM

## 2017-08-05 DIAGNOSIS — I248 Other forms of acute ischemic heart disease: Secondary | ICD-10-CM | POA: Diagnosis present

## 2017-08-05 DIAGNOSIS — Z9841 Cataract extraction status, right eye: Secondary | ICD-10-CM | POA: Diagnosis not present

## 2017-08-05 DIAGNOSIS — R748 Abnormal levels of other serum enzymes: Secondary | ICD-10-CM | POA: Diagnosis not present

## 2017-08-05 DIAGNOSIS — E785 Hyperlipidemia, unspecified: Secondary | ICD-10-CM | POA: Diagnosis present

## 2017-08-05 DIAGNOSIS — I350 Nonrheumatic aortic (valve) stenosis: Secondary | ICD-10-CM | POA: Diagnosis present

## 2017-08-05 DIAGNOSIS — E1065 Type 1 diabetes mellitus with hyperglycemia: Secondary | ICD-10-CM | POA: Diagnosis present

## 2017-08-05 DIAGNOSIS — Z881 Allergy status to other antibiotic agents status: Secondary | ICD-10-CM | POA: Diagnosis not present

## 2017-08-05 DIAGNOSIS — Z794 Long term (current) use of insulin: Secondary | ICD-10-CM | POA: Diagnosis not present

## 2017-08-05 DIAGNOSIS — Z7982 Long term (current) use of aspirin: Secondary | ICD-10-CM

## 2017-08-05 DIAGNOSIS — M810 Age-related osteoporosis without current pathological fracture: Secondary | ICD-10-CM | POA: Diagnosis present

## 2017-08-05 DIAGNOSIS — J40 Bronchitis, not specified as acute or chronic: Secondary | ICD-10-CM | POA: Diagnosis not present

## 2017-08-05 DIAGNOSIS — I340 Nonrheumatic mitral (valve) insufficiency: Secondary | ICD-10-CM | POA: Diagnosis not present

## 2017-08-05 LAB — CBC WITH DIFFERENTIAL/PLATELET
Basophils Absolute: 0.1 10*3/uL (ref 0.0–0.1)
Basophils Relative: 0 %
Eosinophils Absolute: 0 10*3/uL (ref 0.0–0.7)
Eosinophils Relative: 0 %
HCT: 35.6 % — ABNORMAL LOW (ref 36.0–46.0)
Hemoglobin: 11.9 g/dL — ABNORMAL LOW (ref 12.0–15.0)
Lymphocytes Relative: 4 %
Lymphs Abs: 0.6 10*3/uL — ABNORMAL LOW (ref 0.7–4.0)
MCH: 30.3 pg (ref 26.0–34.0)
MCHC: 33.4 g/dL (ref 30.0–36.0)
MCV: 90.6 fL (ref 78.0–100.0)
Monocytes Absolute: 0.3 10*3/uL (ref 0.1–1.0)
Monocytes Relative: 2 %
Neutro Abs: 15.2 10*3/uL — ABNORMAL HIGH (ref 1.7–7.7)
Neutrophils Relative %: 94 %
Platelets: 254 10*3/uL (ref 150–400)
RBC: 3.93 MIL/uL (ref 3.87–5.11)
RDW: 14.7 % (ref 11.5–15.5)
WBC: 16.2 10*3/uL — ABNORMAL HIGH (ref 4.0–10.5)

## 2017-08-05 LAB — BASIC METABOLIC PANEL
Anion gap: 11 (ref 5–15)
BUN: 17 mg/dL (ref 6–20)
CO2: 27 mmol/L (ref 22–32)
Calcium: 9.1 mg/dL (ref 8.9–10.3)
Chloride: 97 mmol/L — ABNORMAL LOW (ref 101–111)
Creatinine, Ser: 0.95 mg/dL (ref 0.44–1.00)
GFR calc Af Amer: >60 mL/min (ref >60)
GFR calc non Af Amer: 57 mL/min — ABNORMAL LOW (ref >60)
Glucose, Bld: 396 mg/dL — ABNORMAL HIGH (ref 65–99)
Potassium: 5.3 mmol/L — ABNORMAL HIGH (ref 3.5–5.1)
Sodium: 135 mmol/L (ref 135–145)

## 2017-08-05 LAB — I-STAT CG4 LACTIC ACID, ED: Lactic Acid, Venous: 1.91 mmol/L — ABNORMAL HIGH (ref 0.5–1.9)

## 2017-08-05 LAB — GLUCOSE, CAPILLARY
Glucose-Capillary: 458 mg/dL — ABNORMAL HIGH (ref 65–99)
Glucose-Capillary: 320 mg/dL — ABNORMAL HIGH (ref 65–99)

## 2017-08-05 LAB — CBG MONITORING, ED: Glucose-Capillary: 396 mg/dL — ABNORMAL HIGH (ref 65–99)

## 2017-08-05 MED ORDER — ENOXAPARIN SODIUM 40 MG/0.4ML ~~LOC~~ SOLN
40.0000 mg | SUBCUTANEOUS | Status: DC
  Administered 2017-08-05: 40 mg via SUBCUTANEOUS
  Filled 2017-08-05: qty 0.4

## 2017-08-05 MED ORDER — ONDANSETRON HCL 4 MG/2ML IJ SOLN
4.0000 mg | Freq: Four times a day (QID) | INTRAMUSCULAR | Status: DC | PRN
  Administered 2017-08-05 – 2017-08-07 (×2): 4 mg via INTRAVENOUS
  Filled 2017-08-05 (×2): qty 2

## 2017-08-05 MED ORDER — SODIUM CHLORIDE 0.9 % IV BOLUS (SEPSIS)
1000.0000 mL | Freq: Once | INTRAVENOUS | Status: AC
  Administered 2017-08-05: 1000 mL via INTRAVENOUS

## 2017-08-05 MED ORDER — SIMVASTATIN 40 MG PO TABS
40.0000 mg | ORAL_TABLET | Freq: Every evening | ORAL | Status: DC
  Administered 2017-08-05 – 2017-08-07 (×3): 40 mg via ORAL
  Filled 2017-08-05 (×3): qty 1

## 2017-08-05 MED ORDER — INSULIN ASPART 100 UNIT/ML ~~LOC~~ SOLN
2.0000 [IU] | Freq: Three times a day (TID) | SUBCUTANEOUS | Status: DC
  Administered 2017-08-05 – 2017-08-06 (×2): 7 [IU] via SUBCUTANEOUS
  Administered 2017-08-06: 6 [IU] via SUBCUTANEOUS

## 2017-08-05 MED ORDER — BASAGLAR KWIKPEN 100 UNIT/ML ~~LOC~~ SOPN: 13.0000 [IU] | PEN_INJECTOR | Freq: Every morning | SUBCUTANEOUS | Status: DC

## 2017-08-05 MED ORDER — DEXTROSE 5 % IV SOLN
500.0000 mg | INTRAVENOUS | Status: DC
  Administered 2017-08-06: 500 mg via INTRAVENOUS
  Filled 2017-08-05: qty 500

## 2017-08-05 MED ORDER — VITAMIN D 1000 UNITS PO TABS
1000.0000 [IU] | ORAL_TABLET | Freq: Every day | ORAL | Status: DC
  Administered 2017-08-06 – 2017-08-07 (×2): 1000 [IU] via ORAL
  Filled 2017-08-05 (×4): qty 1

## 2017-08-05 MED ORDER — ALBUTEROL SULFATE (2.5 MG/3ML) 0.083% IN NEBU
5.0000 mg | INHALATION_SOLUTION | Freq: Once | RESPIRATORY_TRACT | Status: AC
  Administered 2017-08-05: 5 mg via RESPIRATORY_TRACT
  Filled 2017-08-05: qty 6

## 2017-08-05 MED ORDER — AZITHROMYCIN 250 MG PO TABS
500.0000 mg | ORAL_TABLET | Freq: Once | ORAL | Status: AC
  Administered 2017-08-05: 500 mg via ORAL
  Filled 2017-08-05: qty 2

## 2017-08-05 MED ORDER — TRAMADOL HCL 50 MG PO TABS
50.0000 mg | ORAL_TABLET | Freq: Two times a day (BID) | ORAL | Status: DC
  Administered 2017-08-05 – 2017-08-08 (×6): 50 mg via ORAL
  Filled 2017-08-05 (×6): qty 1

## 2017-08-05 MED ORDER — ACETAMINOPHEN 500 MG PO TABS
500.0000 mg | ORAL_TABLET | Freq: Three times a day (TID) | ORAL | Status: DC
  Administered 2017-08-05 – 2017-08-08 (×8): 500 mg via ORAL
  Filled 2017-08-05 (×8): qty 1

## 2017-08-05 MED ORDER — MAGNESIUM OXIDE 400 (241.3 MG) MG PO TABS
400.0000 mg | ORAL_TABLET | Freq: Two times a day (BID) | ORAL | Status: DC
  Administered 2017-08-06 – 2017-08-08 (×2): 400 mg via ORAL
  Filled 2017-08-05 (×5): qty 1

## 2017-08-05 MED ORDER — INSULIN GLARGINE 100 UNIT/ML ~~LOC~~ SOLN
13.0000 [IU] | Freq: Every day | SUBCUTANEOUS | Status: DC
  Administered 2017-08-06: 13 [IU] via SUBCUTANEOUS
  Filled 2017-08-05: qty 0.13

## 2017-08-05 MED ORDER — SACCHAROMYCES BOULARDII 250 MG PO CAPS
250.0000 mg | ORAL_CAPSULE | Freq: Two times a day (BID) | ORAL | Status: DC
  Administered 2017-08-06 – 2017-08-08 (×5): 250 mg via ORAL
  Filled 2017-08-05 (×5): qty 1

## 2017-08-05 MED ORDER — ASPIRIN EC 81 MG PO TBEC
81.0000 mg | DELAYED_RELEASE_TABLET | Freq: Every day | ORAL | Status: DC
  Administered 2017-08-05 – 2017-08-07 (×3): 81 mg via ORAL
  Filled 2017-08-05 (×4): qty 1

## 2017-08-05 NOTE — ED Notes (Signed)
ED TO INPATIENT HANDOFF REPORT  Name/Age/Gender Danielle Clarke 76 y.o. female  Code Status    Code Status Orders        Start     Ordered   08/05/17 1449  Full code  Continuous     08/05/17 1448    Code Status History    Date Active Date Inactive Code Status Order ID Comments User Context   01/04/2017  6:19 PM 01/07/2017  2:38 PM Full Code [id number]  Reyne Dumas, MD ED   07/31/2014  4:54 PM 08/05/2014  8:30 PM Full Code [id number]  Floyce Stakes, MD Inpatient   12/29/2013  9:34 PM 01/01/2014  6:11 PM Full Code [id number]  Etta Quill, DO ED   01/02/2013  6:35 PM 01/03/2013  9:31 PM Full Code [id number]  Marla Roe, RN Inpatient    Advance Directive Documentation     Most Recent Value  Type of Advance Directive  Living will  Pre-existing out of facility DNR order (yellow form or pink MOST form)  -  "MOST" Form in Place?  -      Home/SNF/Other Home  Chief Complaint sob  Level of Care/Admitting Diagnosis ED Disposition    ED Disposition Condition [place]: [facility] [[id number]]  Level of Care: Med-Surg [16]  Diagnosis: Bronchitis [024097]  Admitting Physician: Georgette Shell [[id number]]  Attending Physician: Georgette Shell [[id number]]  Estimated length of stay: 3 - 4 days  Certification:: I certify this patient will need inpatient services for at least 2 midnights  PT Class (Do Not Modify): Inpatient [101]  PT Acc Code (Do Not Modify): Private [1]       Medical History Past Medical History:  Diagnosis Date  . Arthritis   . Asthmatic bronchitis    with colds per patient  . Cataract   . Glaucoma   . Hyperlipemia   . Hypertension    Dr. Tamala Julian ~ 2 years ago  . Neck fracture [facility])    july 2013  . Osteoporosis   . Type 1 diabetes mellitus (HCC)     Allergies Allergies  Allergen Reactions  . Cefaclor Anaphylaxis  . Tetracycline Anaphylaxis  . Vancomycin Anaphylaxis  . Augmentin [Amoxicillin-Pot  Clavulanate] Other (See Comments)    Reaction unknown  . Prednisone Other (See Comments)    Reaction unknown    IV Location/Drains/Wounds Patient Lines/Drains/Airways Status   Active Line/Drains/Airways    Name:   Placement date:   Placement time:   Site:   Days:   Peripheral IV 08/05/17 Right Antecubital  08/05/17    1248    Antecubital    less than 1   Incision (Closed) 07/31/14 Neck Left  07/31/14    1150      1101          Labs/Imaging Results for orders placed or performed during the hospital encounter of 08/05/17 (from the past 48 hour(s))  Basic metabolic panel     Status: Abnormal   Collection Time: 08/05/17 12:28 PM  Result Value Ref Range   Sodium 135 135 - 145 mmol/L   Potassium 5.3 (H) 3.5 - 5.1 mmol/L   Chloride 97 (L) 101 - 111 mmol/L   CO2 27 22 - 32 mmol/L   Glucose, Bld 396 (H) 65 - 99 mg/dL   BUN 17 6 - 20 mg/dL   Creatinine, Ser 0.95 0.44 - 1.00 mg/dL   Calcium 9.1 8.9 - 10.3 mg/dL  GFR calc non Af Amer 57 (L) >60 mL/min   GFR calc Af Amer >60 >60 mL/min    Comment: (NOTE) The eGFR has been calculated using the CKD EPI equation. This calculation has not been validated in all clinical situations. eGFR's persistently <60 mL/min signify possible Chronic Kidney Disease.    Anion gap 11 5 - 15  CBC with Differential     Status: Abnormal   Collection Time: 08/05/17 12:28 PM  Result Value Ref Range   WBC 16.2 (H) 4.0 - 10.5 K/uL   RBC 3.93 3.87 - 5.11 MIL/uL   Hemoglobin 11.9 (L) 12.0 - 15.0 g/dL   HCT 35.6 (L) 36.0 - 46.0 %   MCV 90.6 78.0 - 100.0 fL   MCH 30.3 26.0 - 34.0 pg   MCHC 33.4 30.0 - 36.0 g/dL   RDW 14.7 11.5 - 15.5 %   Platelets 254 150 - 400 K/uL   Neutrophils Relative % 94 %   Neutro Abs 15.2 (H) 1.7 - 7.7 K/uL   Lymphocytes Relative 4 %   Lymphs Abs 0.6 (L) 0.7 - 4.0 K/uL   Monocytes Relative 2 %   Monocytes Absolute 0.3 0.1 - 1.0 K/uL   Eosinophils Relative 0 %   Eosinophils Absolute 0.0 0.0 - 0.7 K/uL   Basophils Relative 0 %    Basophils Absolute 0.1 0.0 - 0.1 K/uL  I-Stat CG4 Lactic Acid, ED     Status: Abnormal   Collection Time: 08/05/17 12:57 PM  Result Value Ref Range   Lactic Acid, Venous 1.91 (H) 0.5 - 1.9 mmol/L  POC CBG, ED     Status: Abnormal   Collection Time: 08/05/17  2:28 PM  Result Value Ref Range   Glucose-Capillary 396 (H) 65 - 99 mg/dL   Dg Chest 2 View  Result Date: 08/05/2017 CLINICAL DATA:  Productive cough since yesterday. EXAM: CHEST  2 VIEW COMPARISON:  08/05/2017 FINDINGS: There is no focal parenchymal opacity. There is no pleural effusion or pneumothorax. The heart and mediastinal contours are unremarkable. There is a right shoulder arthroplasty. There is an S-shaped scoliosis of the thoracolumbar spine. IMPRESSION: No active cardiopulmonary disease. Electronically Signed   By: Kathreen Devoid   On: 08/05/2017 12:53    Pending Labs Unresulted Labs    Start     Ordered   08/12/17 0500  Creatinine, serum  (enoxaparin (LOVENOX)    CrCl >/= 30 ml/min)  Weekly,   R    Comments:  while on enoxaparin therapy    08/05/17 1448   08/06/17 1610  Basic metabolic panel  Tomorrow morning,   R     08/05/17 1448   08/06/17 0500  CBC  Tomorrow morning,   R     08/05/17 1448   08/05/17 1447  CBC  (enoxaparin (LOVENOX)    CrCl >/= 30 ml/min)  Once,   R    Comments:  Baseline for enoxaparin therapy IF NOT ALREADY DRAWN.  Notify MD if PLT < 100 K.    08/05/17 1448   08/05/17 1447  Creatinine, serum  (enoxaparin (LOVENOX)    CrCl >/= 30 ml/min)  Once,   R    Comments:  Baseline for enoxaparin therapy IF NOT ALREADY DRAWN.    08/05/17 1448      Vitals/Pain Today's Vitals   08/05/17 1200 08/05/17 1201 08/05/17 1415 08/05/17 1444  BP: (!) 156/49   134/82  Pulse: (!) 106  (!) 101 (!) 102  Resp: 20  18 (!)  21  Temp: 99 F (37.2 C)     TempSrc: Oral     SpO2: (S) 100%  97% 99%  Weight:  114 lb 2 oz (51.8 kg)    Height:  _0  (1.575 m)      Isolation Precautions No active  isolations  Medications Medications  acetaminophen (TYLENOL) tablet 500 mg (not administered)  aspirin EC tablet 81 mg (not administered)  cholecalciferol (VITAMIN D) tablet 1,000 Units (not administered)  insulin aspart (novoLOG) injection 2-6 Units (not administered)  BASAGLAR KWIKPEN KwikPen 13 Units (not administered)  Magnesium TABS 250 mg (not administered)  simvastatin (ZOCOR) tablet 40 mg (not administered)  traMADol (ULTRAM) tablet 50 mg (not administered)  enoxaparin (LOVENOX) injection 40 mg (not administered)  azithromycin (ZITHROMAX) 500 mg in dextrose 5 % 250 mL IVPB (not administered)  saccharomyces boulardii (FLORASTOR) capsule 250 mg (not administered)  albuterol (PROVENTIL) (2.5 MG/3ML) 0.083% nebulizer solution 5 mg (5 mg Nebulization Given 08/05/17 1234)  sodium chloride 0.9 % bolus 1,000 mL (1,000 mLs Intravenous New Bag/Given 08/05/17 1349)  azithromycin (ZITHROMAX) tablet 500 mg (500 mg Oral Given 08/05/17 1424)    Mobility walks with person assist

## 2017-08-05 NOTE — ED Notes (Signed)
Patient transported to X-ray 

## 2017-08-05 NOTE — ED Notes (Signed)
Bed: WA09 Expected date:  Expected time:  Means of arrival:  Comments: 76 yo PNA from [place]

## 2017-08-05 NOTE — ED Provider Notes (Addendum)
[facility]-EMERGENCY DEPT Provider Note   CSN: [id number] Arrival date & time: 08/05/17  1137     History   Chief Complaint Chief Complaint  Patient presents with  . Shortness of Breath    HPI Danielle Clarke is a 76 y.o. female.  HPI  76 year old female with a history of type 1 diabetes, chronic kidney disease, hypertension presents with shortness of breath and cough. She states that the cough started last night and has been a dry cough. Had a low-grade temperature of 99. Has been feeling short of breath since last night. Daughter saw her today and she was noticed to be breathing hard and took her to the PCP. Resting oxygen saturations there were 84%. After walking to and from x-ray her oxygen saturation dropped to 71%. Albuterol nebulizer seemed to help but she was still short of breath so she was brought into the ER. The patient states that she does not specifically have asthma but has a history of asthmatic bronchitis whenever she gets a cold. No prior smoking history. No leg swelling. No chest pain or tightness.  Past Medical History:  Diagnosis Date  . Arthritis   . Asthmatic bronchitis    with colds per patient  . Cataract   . Glaucoma   . Hyperlipemia   . Hypertension    Dr. [name] ~ 2 years ago  . Neck fracture [facility](HCC)    july 2013  . Osteoporosis   . Type 1 diabetes mellitus [facility](HCC)     Patient Active Problem List   Diagnosis Date Noted  . Bronchitis 08/05/2017  . Bilateral carotid artery stenosis   . Aortic stenosis 08/25/2015  . Cervical stenosis of spinal canal 07/31/2014  . CKD (chronic kidney disease) stage 2, GFR 60-89 ml/min 12/30/2013  . Essential hypertension, benign 12/30/2013  . Hyperlipidemia 12/30/2013  . Toe infection 12/30/2013  . Anemia 12/30/2013  . Acute renal failure (HCC) 12/29/2013  . Syncope 01/02/2013  . Diabetes mellitus type 1 (HCC) 01/02/2013  . Bilateral carotid artery disease (HCC) 01/02/2013  . GOITER, MULTINODULAR 02/11/2008     Past Surgical History:  Procedure Laterality Date  . ANKLE FRACTURE SURGERY Left 2001   steel plate and 3 screws   . ANTERIOR CERVICAL CORPECTOMY N/A 07/31/2014   Procedure: Cervical Four to Cervical Six Corpectomy;  Surgeon: Karn CassisErnesto M Botero, MD;  Location: [facility]UR[facility] ORS;  Service: Neurosurgery;  Laterality: N/A;  C4 to C6 Corpectomy  . BREAST SURGERY  1988  . CATARACT EXTRACTION W/ INTRAOCULAR LENS  IMPLANT, BILATERAL Bilateral 2011   right and then left  . EYE SURGERY    . FRACTURE SURGERY    . HAMMER TOE SURGERY  1998  . JOINT REPLACEMENT    . TONSILLECTOMY AND ADENOIDECTOMY  1948  . TOTAL SHOULDER REPLACEMENT  2010   right shoulder   . TUBAL LIGATION  1979  . TYMPANOSTOMY TUBE PLACEMENT Bilateral     OB History    Gravida Para Term Preterm AB Living             2   SAB TAB Ectopic Multiple Live Births                   Home Medications    Prior to Admission medications   Medication Sig Start Date End Date Taking? Authorizing Provider  acetaminophen (TYLENOL) 500 MG tablet Take 500 mg by mouth 3 (three) times daily.    Yes [provider]  aspirin EC 81 MG  tablet Take 81 mg by mouth daily.   Yes [provider]  cholecalciferol (VITAMIN D) 1000 UNITS tablet Take 1,000 Units by mouth daily.    Yes [provider]  Chromium Picolinate 1000 MCG TABS Take 1,000 mcg by mouth daily.    Yes [provider]  Cinnamon 500 MG TABS Take 1 tablet by mouth 3 (three) times daily.   Yes [provider]  diclofenac (VOLTAREN) 75 MG EC tablet Take 75 mg by mouth daily.   Yes [provider]  insulin aspart (NOVOLOG) 100 UNIT/ML injection Inject 2-6 Units into the skin See admin instructions. Sliding scale. Base= 2 units-Reading below 80 subtract 1; 80-150 take 2 units (base dose); 151-200 add 1 unit; 201-250 add 2 units; 251-300 add 3 units; 301-350 add 4 units; 351-400 add 5 units; 401-450 add 6 units   Yes [provider]   Insulin Glargine (BASAGLAR KWIKPEN) 100 UNIT/ML SOPN Inject 13 Units into the skin every morning.    Yes [provider]  Magnesium 250 MG TABS Take 250 mg by mouth 2 (two) times daily.    Yes [provider]  Multiple Vitamin (MULTIVITAMIN WITH MINERALS) TABS tablet Take 1 tablet by mouth daily.   Yes [provider]  Polyethyl Glycol-Propyl Glycol (SYSTANE OP) Place 1 drop into both eyes daily as needed (dry eyes).   Yes [provider]  simvastatin (ZOCOR) 40 MG tablet Take 40 mg by mouth every evening.   Yes [provider]  traMADol (ULTRAM) 50 MG tablet Take 50 mg by mouth 2 (two) times daily.    Yes [provider]  Turmeric 500 MG CAPS Take 500 mg by mouth daily.   Yes [provider]    Family History Family History  Problem Relation Age of Onset  . Cancer Mother   . Heart Problems Father     Social History Social History  Substance Use Topics  . Smoking status: Never Smoker  . Smokeless tobacco: Never Used  . Alcohol use 4.2 oz/week    7 Glasses of wine per week     Allergies   Cefaclor; Tetracycline; Vancomycin; Augmentin [amoxicillin-pot clavulanate]; and Prednisone   Review of Systems Review of Systems  Constitutional: Negative for fever.  HENT: Positive for congestion and rhinorrhea.   Respiratory: Positive for cough and shortness of breath.   Cardiovascular: Negative for chest pain and leg swelling.  All other systems reviewed and are negative.    Physical Exam Updated Vital Signs BP 134/82 (BP Location: Left Arm)   Pulse (!) 102   Temp 99 F (37.2 C) (Oral)   Resp (!) 21   Ht  (1.575 m)   Wt 51.8 kg (114 lb 2 oz)   SpO2 99%   BMI 20.87 kg/m   Physical Exam  Constitutional: She is oriented to person, place, and time. She appears well-developed and well-nourished. No distress.  HENT:  Head: Normocephalic and atraumatic.  Right Ear: External ear normal.  Left Ear: External ear  normal.  Nose: Nose normal.  Eyes: Right eye exhibits no discharge. Left eye exhibits no discharge.  Cardiovascular: Normal rate and regular rhythm.   Murmur heard. Pulmonary/Chest: Effort normal. She has wheezes (coarse wheezing bilaterally).  Abdominal: Soft. There is no tenderness.  Musculoskeletal: She exhibits no edema.  Neurological: She is alert and oriented to person, place, and time.  Skin: Skin is warm and dry. She is not diaphoretic.  Nursing note and vitals reviewed.  ED Treatments / Results  Labs (all labs ordered are listed, but only abnormal results are displayed) Labs Reviewed  BASIC METABOLIC PANEL - Abnormal; Notable for the following:       Result Value   Potassium 5.3 (*)    Chloride 97 (*)    Glucose, Bld 396 (*)    GFR calc non Af Amer 57 (*)    All other components within normal limits  CBC WITH DIFFERENTIAL/PLATELET - Abnormal; Notable for the following:    WBC 16.2 (*)    Hemoglobin 11.9 (*)    HCT 35.6 (*)    Neutro Abs 15.2 (*)    Lymphs Abs 0.6 (*)    All other components within normal limits  I-STAT CG4 LACTIC ACID, ED - Abnormal; Notable for the following:    Lactic Acid, Venous 1.91 (*)    All other components within normal limits  CBG MONITORING, ED - Abnormal; Notable for the following:    Glucose-Capillary 396 (*)    All other components within normal limits  CBC  CREATININE, SERUM    EKG  EKG Interpretation  Date/Time:  Sunday August 05 2017 12:03:27 EDT Ventricular Rate:  103 PR Interval:    QRS Duration: 103 QT Interval:  382 QTC Calculation: 501 R Axis:   27 Text Interpretation:  Sinus tachycardia Right atrial enlargement LVH with secondary repolarization abnormality Anterior infarct, old Prolonged QT interval rate is faster, otherwise similar to Feb 2018 Confirmed by Pricilla Loveless [phone]) on 08/05/2017 1:22:20 PM       Radiology Dg Chest 2 View  Result Date: 08/05/2017 CLINICAL DATA:  Productive cough since  yesterday. EXAM: CHEST  2 VIEW COMPARISON:  08/05/2017 FINDINGS: There is no focal parenchymal opacity. There is no pleural effusion or pneumothorax. The heart and mediastinal contours are unremarkable. There is a right shoulder arthroplasty. There is an S-shaped scoliosis of the thoracolumbar spine. IMPRESSION: No active cardiopulmonary disease. Electronically Signed   By: Elige Ko   On: 08/05/2017 12:53    Procedures Procedures (including critical care time)  Medications Ordered in ED Medications  acetaminophen (TYLENOL) tablet 500 mg (not administered)  aspirin EC tablet 81 mg (not administered)  cholecalciferol (VITAMIN D) tablet 1,000 Units (not administered)  insulin aspart (novoLOG) injection 2-6 Units (not administered)  BASAGLAR KWIKPEN KwikPen 13 Units (not administered)  Magnesium TABS 250 mg (not administered)  simvastatin (ZOCOR) tablet 40 mg (not administered)  traMADol (ULTRAM) tablet 50 mg (not administered)  enoxaparin (LOVENOX) injection 40 mg (not administered)  azithromycin (ZITHROMAX) 500 mg in dextrose 5 % 250 mL IVPB (not administered)  saccharomyces boulardii (FLORASTOR) capsule 250 mg (not administered)  albuterol (PROVENTIL) (2.5 MG/3ML) 0.083% nebulizer solution 5 mg (5 mg Nebulization Given 08/05/17 1234)  sodium chloride 0.9 % bolus 1,000 mL (1,000 mLs Intravenous New Bag/Given 08/05/17 1349)  azithromycin (ZITHROMAX) tablet 500 mg (500 mg Oral Given 08/05/17 1424)     Initial Impression / Assessment and Plan / ED Course  I have reviewed the triage vital signs and the nursing notes.  Pertinent labs & imaging results that were available during my care of the patient were reviewed by me and considered in my medical decision making (see chart for details).     Shortness of breath has improved with albuterol nebulizer in the ED. However she still is requiring oxygen. She is not in distress. Having this is most likely infectious in etiology, likely bronchitis.  She does not have a clear history of asthma  some not sure steroids to be beneficial. However she will still need respiratory support, antibiotics, and admission to the hospital. Doubt PE, ACS.  Final Clinical Impressions(s) / ED Diagnoses   Final diagnoses:  Bronchitis  Hypoxia    New Prescriptions New Prescriptions   No medications on file     Pricilla Loveless, MD 08/05/17 1517    Pricilla Loveless, MD 08/05/17 [phone]

## 2017-08-05 NOTE — ED Notes (Signed)
Hospitalist at bedside 

## 2017-08-05 NOTE — H&P (Signed)
History and Physical    Danielle FordyceJacqueline J Dowlen ZOX:[id number] DOB: 1941/03/03 DOA: 08/05/2017  PCP: Juluis RainierBarnes, Tameyah Koch, MD  Patient coming from:   I have personally briefly reviewed patient[facility]  Chief Complaint: Shortness of breath and cough  HPI: Danielle Clarke is a 76 y.o. female with medical history significant type 1 diabetes, admitted with nonproductive cough and shortness of breath. She came here from the walk-in clinic and was hypoxic with low 80s on room air and was placed on 2 L of oxygen but saturation going up to about 92% her breathing improved after albuterol SVN treatment. Patient reports that she does not have a history of asthma occasionally she gets bronchitis. She does not smoke. She denies any nausea vomiting diarrhea fever chills urinary complaints abdominal pain.  ED Course: Chest x-ray was done which was clear. Her saturation dropped to 71% while ambulating. She was given azithromycin in the ER. Her white count was 16.2, hemoglobin 11.9 platelets 254 sodium 135, potassium 5.3 BUN 17 creatinine 0.95.  Review of Systems: As per HPI otherwise 10 point review of systems negative.    Past Medical History:  Diagnosis Date  . Arthritis   . Asthmatic bronchitis    with colds per patient  . Cataract   . Glaucoma   . Hyperlipemia   . Hypertension    Dr. [name] ~ 2 years ago  . Neck fracture [facility](HCC)    july 2013  . Osteoporosis   . Type 1 diabetes mellitus (HCC)     Past Surgical History:  Procedure Laterality Date  . ANKLE FRACTURE SURGERY Left 2001   steel plate and 3 screws   . ANTERIOR CERVICAL CORPECTOMY N/A 07/31/2014   Procedure: Cervical Four to Cervical Six Corpectomy;  Surgeon: Karn CassisErnesto M Botero, MD;  Location: [facility];  Service: Neurosurgery;  Laterality: N/A;  C4 to C6 Corpectomy  . BREAST SURGERY  1988  . CATARACT EXTRACTION W/ INTRAOCULAR LENS  IMPLANT, BILATERAL Bilateral 2011   right and then left  . EYE SURGERY     . FRACTURE SURGERY    . HAMMER TOE SURGERY  1998  . JOINT REPLACEMENT    . TONSILLECTOMY AND ADENOIDECTOMY  1948  . TOTAL SHOULDER REPLACEMENT  2010   right shoulder   . TUBAL LIGATION  1979  . TYMPANOSTOMY TUBE PLACEMENT Bilateral      reports that she has never smoked. She has never used smokeless tobacco. She reports that she drinks about 4.2 oz of alcohol per week . She reports that she uses drugs, including Mescaline.  Allergies  Allergen Reactions  . Cefaclor Anaphylaxis  . Tetracycline Anaphylaxis  . Vancomycin Anaphylaxis  . Augmentin [Amoxicillin-Pot Clavulanate] Other (See Comments)    Reaction unknown  . Prednisone Other (See Comments)    Reaction unknown    Family History  Problem Relation Age of Onset  . Cancer Mother   . Heart Problems Father    Unacceptable: Noncontributory, unremarkable, or negative. Acceptable: Family history reviewed and not pertinent (If you reviewed it)  Prior to Admission medications   Medication Sig Start Date End Date Taking? Authorizing Provider  acetaminophen (TYLENOL) 500 MG tablet Take 500 mg by mouth 3 (three) times daily.    Yes [provider]  aspirin EC 81 MG tablet Take 81 mg by mouth daily.   Yes [provider]  cholecalciferol (VITAMIN D) 1000 UNITS tablet Take 1,000 Units by mouth daily.  Yes [provider]  Chromium Picolinate 1000 MCG TABS Take 1,000 mcg by mouth daily.    Yes [provider]  Cinnamon 500 MG TABS Take 1 tablet by mouth 3 (three) times daily.   Yes [provider]  diclofenac (VOLTAREN) 75 MG EC tablet Take 75 mg by mouth daily.   Yes [provider]  insulin aspart (NOVOLOG) 100 UNIT/ML injection Inject 2-6 Units into the skin See admin instructions. Sliding scale. Base= 2 units-Reading below 80 subtract 1; 80-150 take 2 units (base dose); 151-200 add 1 unit; 201-250 add 2 units; 251-300 add 3 units; 301-350 add 4 units; 351-400 add 5 units;  401-450 add 6 units   Yes [provider]  Insulin Glargine (BASAGLAR KWIKPEN) 100 UNIT/ML SOPN Inject 13 Units into the skin every morning.    Yes [provider]  Magnesium 250 MG TABS Take 250 mg by mouth 2 (two) times daily.    Yes [provider]  Multiple Vitamin (MULTIVITAMIN WITH MINERALS) TABS tablet Take 1 tablet by mouth daily.   Yes [provider]  Polyethyl Glycol-Propyl Glycol (SYSTANE OP) Place 1 drop into both eyes daily as needed (dry eyes).   Yes [provider]  simvastatin (ZOCOR) 40 MG tablet Take 40 mg by mouth every evening.   Yes [provider]  traMADol (ULTRAM) 50 MG tablet Take 50 mg by mouth 2 (two) times daily.    Yes [provider]  Turmeric 500 MG CAPS Take 500 mg by mouth daily.   Yes [provider]    Physical Exam: Vitals:   08/05/17 1200 08/05/17 1201 08/05/17 1415 08/05/17 1444  BP: (!) 156/49   134/82  Pulse: (!) 106  (!) 101 (!) 102  Resp: 20  18 (!) 21  Temp: 99 F (37.2 C)     TempSrc: Oral     SpO2: (S) 100%  97% 99%  Weight:  51.8 kg (114 lb 2 oz)    Height:   (1.575 m)      Constitutional: NAD, calm, comfortable Vitals:   08/05/17 1200 08/05/17 1201 08/05/17 1415 08/05/17 1444  BP: (!) 156/49   134/82  Pulse: (!) 106  (!) 101 (!) 102  Resp: 20  18 (!) 21  Temp: 99 F (37.2 C)     TempSrc: Oral     SpO2: (S) 100%  97% 99%  Weight:  51.8 kg (114 lb 2 oz)    Height:   (1.575 m)     Eyes: PERRL, lids and conjunctivae normal ENMT: Mucous membranes are moist. Posterior pharynx clear of any exudate or lesions.Normal dentition.  Neck: normal, supple, no masses, no thyromegaly Respiratory: clear to auscultation bilaterally, no wheezing, no crackles. Normal respiratory effort. No accessory muscle use.  Cardiovascular: Regular rate and rhythm, no murmurs / rubs / gallops. No extremity edema. 2+ pedal pulses. No carotid bruits.  Abdomen: no tenderness, no  masses palpated. No hepatosplenomegaly. Bowel sounds positive.  Musculoskeletal: no clubbing / cyanosis. No joint deformity upper and lower extremities. Good ROM, no contractures. Normal muscle tone.  Skin: no rashes, lesions, ulcers. No induration Neurologic: CN 2-12 grossly intact. Sensation intact, DTR normal. Strength 5/5 in all 4.  Psychiatric: Normal judgment and insight. Alert and oriented x 3. Normal mood  Labs on Admission: I have personally reviewed following labs and imaging studies  CBC:  Recent Labs Lab 08/05/17 1228  WBC 16.2*  NEUTROABS 15.2*  HGB 11.9*  HCT  35.6*  MCV 90.6  PLT 254   Basic Metabolic Panel:  Recent Labs Lab 08/05/17 1228  NA 135  K 5.3*  CL 97*  CO2 27  GLUCOSE 396*  BUN 17  CREATININE 0.95  CALCIUM 9.1   GFR: Estimated Creatinine Clearance: 40.5 mL/min (by C-G formula based on SCr of 0.95 mg/dL). Liver Function Tests: No results for input(s): AST, ALT, ALKPHOS, BILITOT, PROT, ALBUMIN in the last 168 hours. No results for input(s): LIPASE, AMYLASE in the last 168 hours. No results for input(s): AMMONIA in the last 168 hours. Coagulation Profile: No results for input(s): INR, PROTIME in the last 168 hours. Cardiac Enzymes: No results for input(s): CKTOTAL, CKMB, CKMBINDEX, TROPONINI in the last 168 hours. BNP (last 3 results) No results for input(s): PROBNP in the last 8760 hours. HbA1C: No results for input(s): HGBA1C in the last 72 hours. CBG:  Recent Labs Lab 08/05/17 1428  GLUCAP 396*   Lipid Profile: No results for input(s): CHOL, HDL, LDLCALC, TRIG, CHOLHDL, LDLDIRECT in the last 72 hours. Thyroid Function Tests: No results for input(s): TSH, T4TOTAL, FREET4, T3FREE, THYROIDAB in the last 72 hours. Anemia Panel: No results for input(s): VITAMINB12, FOLATE, FERRITIN, TIBC, IRON, RETICCTPCT in the last 72 hours. Urine analysis:    Component Value Date/Time   COLORURINE STRAW (A) 01/04/2017 2100   APPEARANCEUR CLEAR  01/04/2017 2100   LABSPEC 1.010 01/04/2017 2100   PHURINE 7.0 01/04/2017 2100   GLUCOSEU NEGATIVE 01/04/2017 2100   HGBUR NEGATIVE 01/04/2017 2100   BILIRUBINUR NEGATIVE 01/04/2017 2100   KETONESUR NEGATIVE 01/04/2017 2100   PROTEINUR NEGATIVE 01/04/2017 2100   UROBILINOGEN 0.2 10/07/2014 1808   NITRITE NEGATIVE 01/04/2017 2100   LEUKOCYTESUR NEGATIVE 01/04/2017 2100    Radiological Exams on Admission: Dg Chest 2 View  Result Date: 08/05/2017 CLINICAL DATA:  Productive cough since yesterday. EXAM: CHEST  2 VIEW COMPARISON:  08/05/2017 FINDINGS: There is no focal parenchymal opacity. There is no pleural effusion or pneumothorax. The heart and mediastinal contours are unremarkable. There is a right shoulder arthroplasty. There is an S-shaped scoliosis of the thoracolumbar spine. IMPRESSION: No active cardiopulmonary disease. Electronically Signed   By: Elige Ko   On: 08/05/2017 12:53    EKG:   Assessment/Plan Active Problems:   Bronchitis   Bronchitis with negative chest x-ray and hypoxia. Azithromycin, SVN treatments with albuterol. I'm holding off on adding steroids at this time because she felt better after the nebulizer treatment and I don't hear wheezing.  Leukocytosis secondary to bronchitis continue antibiotics and follow the levels.  Type 1 diabetes restart her home dose of insulin.  Hyperkalemia normal renal functions unclear why her potassium is high. Specimen is not hemolyzed. Follow levels tomorrow.   Hyperlipidemia continue Zocor. DVT prophylaxis: Lovenox Code Status: Full Family Communication: Discussed with daughter Disposition Plan: Home when stable Consults called: None Admission status: med surg   Alwyn Ren MD [facility]   If 7PM-7AM, please contact night-coverage [email or web address] Password [email or web address]  08/05/2017, 2:53 PM

## 2017-08-05 NOTE — ED Notes (Signed)
ED Provider at bedside. 

## 2017-08-05 NOTE — ED Notes (Signed)
Pt reports feeling nausea after being given antibiotics. Pt provided with soup, water and gigngerale

## 2017-08-05 NOTE — ED Triage Notes (Signed)
Per EMS:  Pt is coming from [facility] Pt developed a new onset cough aproximately 24 hours ago. [name] cough is unproductive.  Increased SOB with exertion. Reported she was in the low 80's on RA No obvious respiratory distress and no audible wheezing. Pt is on 3 L for comfort and has received an albuterol treatment.  Pt has a hx of diabetes. [name] CBG 425 with EMS. No c/o chest pain or N/V/D   Last Vitals: 152/96 94 HR 97% on 2L 02 425 CBG

## 2017-08-06 ENCOUNTER — Inpatient Hospital Stay (HOSPITAL_COMMUNITY): Payer: Medicare Other

## 2017-08-06 ENCOUNTER — Encounter (HOSPITAL_COMMUNITY): Payer: Self-pay | Admitting: Radiology

## 2017-08-06 DIAGNOSIS — R9431 Abnormal electrocardiogram [ECG] [EKG]: Secondary | ICD-10-CM

## 2017-08-06 DIAGNOSIS — R778 Other specified abnormalities of plasma proteins: Secondary | ICD-10-CM

## 2017-08-06 DIAGNOSIS — R0600 Dyspnea, unspecified: Secondary | ICD-10-CM

## 2017-08-06 DIAGNOSIS — I350 Nonrheumatic aortic (valve) stenosis: Secondary | ICD-10-CM

## 2017-08-06 DIAGNOSIS — R7989 Other specified abnormal findings of blood chemistry: Secondary | ICD-10-CM

## 2017-08-06 LAB — BASIC METABOLIC PANEL
Anion gap: 12 (ref 5–15)
BUN: 14 mg/dL (ref 6–20)
CO2: 26 mmol/L (ref 22–32)
Calcium: 8.4 mg/dL — ABNORMAL LOW (ref 8.9–10.3)
Chloride: 100 mmol/L — ABNORMAL LOW (ref 101–111)
Creatinine, Ser: 0.91 mg/dL (ref 0.44–1.00)
GFR calc Af Amer: >60 mL/min (ref >60)
GFR calc non Af Amer: >60 mL/min (ref >60)
Glucose, Bld: 339 mg/dL — ABNORMAL HIGH (ref 65–99)
Potassium: 4.6 mmol/L (ref 3.5–5.1)
Sodium: 138 mmol/L (ref 135–145)

## 2017-08-06 LAB — CBC
HCT: 30.9 % — ABNORMAL LOW (ref 36.0–46.0)
Hemoglobin: 10.1 g/dL — ABNORMAL LOW (ref 12.0–15.0)
MCH: 28.9 pg (ref 26.0–34.0)
MCHC: 32.7 g/dL (ref 30.0–36.0)
MCV: 88.5 fL (ref 78.0–100.0)
Platelets: 189 10*3/uL (ref 150–400)
RBC: 3.49 MIL/uL — ABNORMAL LOW (ref 3.87–5.11)
RDW: 14.9 % (ref 11.5–15.5)
WBC: 9 10*3/uL (ref 4.0–10.5)

## 2017-08-06 LAB — GLUCOSE, CAPILLARY
Glucose-Capillary: 317 mg/dL — ABNORMAL HIGH (ref 65–99)
Glucose-Capillary: 356 mg/dL — ABNORMAL HIGH (ref 65–99)
Glucose-Capillary: 155 mg/dL — ABNORMAL HIGH (ref 65–99)
Glucose-Capillary: 199 mg/dL — ABNORMAL HIGH (ref 65–99)

## 2017-08-06 LAB — TROPONIN I
Troponin I: 0.43 ng/mL (ref <0.03)
Troponin I: 0.34 ng/mL (ref <0.03)

## 2017-08-06 LAB — BRAIN NATRIURETIC PEPTIDE: B Natriuretic Peptide: 302.4 pg/mL — ABNORMAL HIGH (ref 0.0–100.0)

## 2017-08-06 MED ORDER — IOPAMIDOL (ISOVUE-370) INJECTION 76%
INTRAVENOUS | Status: AC
  Administered 2017-08-06: 67 mL via INTRAVENOUS
  Filled 2017-08-06: qty 100

## 2017-08-06 MED ORDER — IPRATROPIUM-ALBUTEROL 0.5-2.5 (3) MG/3ML IN SOLN
3.0000 mL | Freq: Three times a day (TID) | RESPIRATORY_TRACT | Status: DC
  Administered 2017-08-06 (×2): 3 mL via RESPIRATORY_TRACT
  Filled 2017-08-06 (×2): qty 3

## 2017-08-06 MED ORDER — HEPARIN BOLUS VIA INFUSION
2500.0000 [IU] | Freq: Once | INTRAVENOUS | Status: AC
  Administered 2017-08-06: 2500 [IU] via INTRAVENOUS
  Filled 2017-08-06: qty 2500

## 2017-08-06 MED ORDER — INSULIN ASPART 100 UNIT/ML ~~LOC~~ SOLN
0.0000 [IU] | Freq: Every day | SUBCUTANEOUS | Status: DC
  Administered 2017-08-07: 4 [IU] via SUBCUTANEOUS

## 2017-08-06 MED ORDER — IOPAMIDOL (ISOVUE-370) INJECTION 76%
100.0000 mL | Freq: Once | INTRAVENOUS | Status: AC | PRN
  Administered 2017-08-06: 67 mL via INTRAVENOUS

## 2017-08-06 MED ORDER — HEPARIN (PORCINE) IN NACL 100-0.45 UNIT/ML-% IJ SOLN
700.0000 [IU]/h | INTRAMUSCULAR | Status: DC
  Administered 2017-08-06: 600 [IU]/h via INTRAVENOUS
  Filled 2017-08-06: qty 250

## 2017-08-06 MED ORDER — INSULIN ASPART 100 UNIT/ML ~~LOC~~ SOLN
0.0000 [IU] | Freq: Three times a day (TID) | SUBCUTANEOUS | Status: DC
  Administered 2017-08-06 – 2017-08-07 (×2): 3 [IU] via SUBCUTANEOUS
  Administered 2017-08-07: 2 [IU] via SUBCUTANEOUS
  Administered 2017-08-08: 11 [IU] via SUBCUTANEOUS

## 2017-08-06 MED ORDER — MENTHOL 3 MG MT LOZG
1.0000 | LOZENGE | OROMUCOSAL | Status: DC | PRN
  Filled 2017-08-06: qty 9

## 2017-08-06 MED ORDER — INSULIN GLARGINE 100 UNIT/ML ~~LOC~~ SOLN
18.0000 [IU] | Freq: Every day | SUBCUTANEOUS | Status: DC
  Administered 2017-08-07 – 2017-08-08 (×2): 18 [IU] via SUBCUTANEOUS
  Filled 2017-08-06 (×2): qty 0.18

## 2017-08-06 MED ORDER — IPRATROPIUM-ALBUTEROL 0.5-2.5 (3) MG/3ML IN SOLN
3.0000 mL | RESPIRATORY_TRACT | Status: DC | PRN
  Administered 2017-08-08: 3 mL via RESPIRATORY_TRACT
  Filled 2017-08-06: qty 3

## 2017-08-06 MED ORDER — AZITHROMYCIN 250 MG PO TABS
500.0000 mg | ORAL_TABLET | Freq: Every day | ORAL | Status: DC
  Administered 2017-08-06 – 2017-08-08 (×3): 500 mg via ORAL
  Filled 2017-08-06 (×3): qty 2

## 2017-08-06 NOTE — Consult Note (Signed)
Cardiology Consultation:   Patient ID: Danielle Clarke; [id number]; 07-21-1941   Admit date: 08/05/2017 Date of Consult: 08/06/2017  Primary Care Provider: Juluis Rainier, MD Primary Cardiologist: Dr. Katrinka Clarke Primary Electrophysiologist:  n/a   Patient Profile:   Danielle Clarke is a 76 y.o. female with a hx of carotid disease, aortic stenosis,  CKD II, pulmonary hypertension, Arthritis, glaucoma, hypertension, DM I who is being seen today for the evaluation of elevated troponin at the request of Dr. Rito Clarke.  History of Present Illness:   Danielle Clarke last saw Danielle Clarke in the office for routine follow-up of her carotid dz bilateral, aortic stenosis, and hypertension. Recent dopplers showed 40-59% bilateral ICA stenosis- repeated 12/2016 without significant change. She has a history of syncope felt to be related to swallow syncope. Echocardiogram 01/05/2017: EF 60-65%, G1DD, mild aortic stenosis. Carotid Dopplers: 01/05/2017 40-59% right, 1-39% left no significant change  She presented to the ER yesterday from the walk in clinic where she went for SOB and was found to be hypoxic with O2 sats in the 80s on room air, improving to 92% on 2L oxygen nasal cannula. She says that she started with a dry hacking cough on Saturday and breathing got worse yesterday. The walk in clinic called EMS due to hypoxia.  In ER cxray was clear but her O2 saturation dropped to 71% while ambulating. She was given azithromycin in the ER and nebulizer treatments. Her white count was 16.2, hemoglobin 11.9 platelets 254 sodium 135, potassium 5.3 BUN 17 creatinine 0.95.  She was admitted for treatment of acute viral asthmatic bronchitis.   She denies any chest pain or pressure and prior to getting sick this weekend was quite active.  She says that she has no problems going out walking several blocks and going up stairs.  EKG showing nonspecific changes and possible ST segment changes in the lateral leads so a troponin,  BNP and repeat EKG were ordered. CT angio chest done showed multivessel coronary atherosclerosis. Troponin mildly elevated today at 0.43 and Cardiology was consulted.  She denies any chest pain or pressure and only complains of cough and SOB with her URI. Marland Kitchen  Past Medical History:  Diagnosis Date  . Arthritis   . Asthmatic bronchitis    with colds per patient  . Cataract   . Glaucoma   . Hyperlipemia   . Hypertension    Dr. Katrinka Clarke ~ 2 years ago  . Neck fracture [facility])    july 2013  . Osteoporosis   . Type 1 diabetes mellitus (HCC)     Past Surgical History:  Procedure Laterality Date  . ANKLE FRACTURE SURGERY Left 2001   steel plate and 3 screws   . ANTERIOR CERVICAL CORPECTOMY N/A 07/31/2014   Procedure: Cervical Four to Cervical Six Corpectomy;  Surgeon: Danielle Cassis, MD;  Location: [facility];  Service: Neurosurgery;  Laterality: N/A;  C4 to C6 Corpectomy  . BREAST SURGERY  1988  . CATARACT EXTRACTION W/ INTRAOCULAR LENS  IMPLANT, BILATERAL Bilateral 2011   right and then left  . EYE SURGERY    . FRACTURE SURGERY    . HAMMER TOE SURGERY  1998  . JOINT REPLACEMENT    . TONSILLECTOMY AND ADENOIDECTOMY  1948  . TOTAL SHOULDER REPLACEMENT  2010   right shoulder   . TUBAL LIGATION  1979  . TYMPANOSTOMY TUBE PLACEMENT Bilateral      Inpatient Medications: Scheduled Meds: . acetaminophen  500 mg Oral TID  .  aspirin EC  81 mg Oral Daily  . azithromycin  500 mg Oral Daily  . cholecalciferol  1,000 Units Oral Daily  . insulin aspart  0-15 Units Subcutaneous TID WC  . insulin aspart  0-5 Units Subcutaneous QHS  . insulin aspart  2-11 Units Subcutaneous TID AC  . [START ON 08/07/2017] insulin glargine  18 Units Subcutaneous Daily  . ipratropium-albuterol  3 mL Nebulization TID  . magnesium oxide  400 mg Oral BID  . saccharomyces boulardii  250 mg Oral BID  . simvastatin  40 mg Oral QPM  . traMADol  50 mg Oral BID   Continuous Infusions:  PRN  Meds: menthol-cetylpyridinium, ondansetron (ZOFRAN) IV  Allergies:    Allergies  Allergen Reactions  . Cefaclor Anaphylaxis  . Tetracycline Anaphylaxis  . Vancomycin Anaphylaxis  . Augmentin [Amoxicillin-Pot Clavulanate] Other (See Comments)    Reaction unknown  . Prednisone Other (See Comments)    Reaction unknown    Social History:   Social History   Social History  . Marital status: Divorced    Spouse name: N/A  . Number of children: 2  . Years of education: [employer]   Occupational History  . Retired Retired   Social History Main Topics  . Smoking status: Never Smoker  . Smokeless tobacco: Never Used  . Alcohol use 4.2 oz/week    7 Glasses of wine per week  . Drug use: Yes    Types: Mescaline  . Sexual activity: No   Other Topics Concern  . Not on file   Social History Narrative   Patient lives at home alone.   Caffeine Use:     Family History:   Family History  Problem Relation Age of Onset  . Cancer Mother   . Heart Problems Father      ROS:  Please see the history of present illness.  ROS  All other ROS reviewed and negative.     Physical Exam/Data:   Vitals:   08/05/17 1608 08/05/17 2029 08/06/17 0512 08/06/17 1442  BP: (!) 131/40 (!) 138/58 (!) 157/49 (!) 136/51  Pulse: 97 87 79 68  Resp: Temp: 100 F (37.8 C) 99.5 F (37.5 C) 98.4 F (36.9 C) 98.8 F (37.1 C)  TempSrc: Oral Oral Oral Oral  SpO2: 97%  100% 100%  Weight:      Height:        Intake/Output Summary (Last 24 hours) at 08/06/17 1602 Last data filed at 08/06/17 1400  Gross per 24 hour  Intake             1480 ml  Output             1790 ml  Net             -310 ml   Filed Weights   08/05/17 1201  Weight: 114 lb 2 oz (51.8 kg)   Body mass index is 20.87 kg/m.  General:  Well nourished, well developed, in no acute distress, elderly caucasian female. HEENT: normal Lymph: no adenopathy Neck: no JVD Endocrine:  No thryomegaly Vascular: No carotid  bruits; FA pulses 2+ bilaterally without bruits  Cardiac:  RRR with 2/6 SM at RUSB Lungs:  Diffuse loud rhonchi and wheezing Abd: soft, nontender, no hepatomegaly  Ext: no edema Musculoskeletal:  No deformities, BUE and BLE strength normal and equal Skin: warm and dry  Neuro:  CNs 2-12 intact, no focal abnormalities noted Psych:  Normal affect  EKG:  The EKG was personally reviewed and demonstrates:  HR 72, NSR Telemetry:  Telemetry was personally reviewed and demonstrates:  No telemetry  Relevant CV Studies:  01/05/2017 ECHOCARDIOGRAM - Left ventricle: The cavity size was normal. Systolic function was   normal. The estimated ejection fraction was in the range of 60%   to 65%. Wall motion was normal; there were no regional wall   motion abnormalities. Doppler parameters are consistent with   abnormal left ventricular relaxation (grade 1 diastolic   dysfunction). There was no evidence of elevated ventricular   filling pressure by Doppler parameters. - Aortic valve: Valve mobility was restricted. There was mild   stenosis. Mean gradient (S): 15 mm Hg. Peak gradient (S): 23 mm   Hg. Valve area (VTI): 0.88 cm^2. Valve area (Vmax): 0.87 cm^2.   Valve area (Vmean): 0.84 cm^2. - Aortic root: The aortic root was normal in size. - Mitral valve: Calcified annulus. Mildly thickened leaflets .   There was mild regurgitation. - Right ventricle: Systolic function was normal. - Tricuspid valve: There was moderate regurgitation. - Pulmonary arteries: Systolic pressure was moderately increased.   PA peak pressure: 46 mm Hg (S). - Inferior vena cava: The vessel was normal in size. - Pericardium, extracardiac: There was no pericardial effusion.  Carotid Dopplers 01/05/2017 Summary:  - Findings consistent with 40 - 59 percent stenosis involving the   right internal carotid artery by peak systolic velocity. - Findings consistent with 1-39 percent stenosis in the left   internal carotid  artery.  Other specific details can be found in the table(s) above. Prepared and Electronically Authenticated by  Laboratory Data:  Chemistry Recent Labs Lab 08/05/17 1228 08/06/17 0441  NA 135 138  K 5.3* 4.6  CL 97* 100*  CO2 27 26  GLUCOSE 396* 339*  BUN 17 14  CREATININE 0.95 0.91  CALCIUM 9.1 8.4*  GFRNONAA 57* >60  GFRAA >60 >60  ANIONGAP 11 12    No results for input(s): PROT, ALBUMIN, AST, ALT, ALKPHOS, BILITOT in the last 168 hours. Hematology Recent Labs Lab 08/05/17 1228 08/06/17 0441  WBC 16.2* 9.0  RBC 3.93 3.49*  HGB 11.9* 10.1*  HCT 35.6* 30.9*  MCV 90.6 88.5  MCH 30.3 28.9  MCHC 33.4 32.7  RDW 14.7 14.9  PLT 254 189   Cardiac Enzymes Recent Labs Lab 08/06/17 1345  TROPONINI 0.43*   No results for input(s): TROPIPOC in the last 168 hours.  BNP Recent Labs Lab 08/06/17 0441  BNP 302.4*    DDimer No results for input(s): DDIMER in the last 168 hours.  Radiology/Studies:  Dg Chest 2 View  Result Date: 08/05/2017 CLINICAL DATA:  Productive cough since yesterday. EXAM: CHEST  2 VIEW COMPARISON:  08/05/2017 FINDINGS: There is no focal parenchymal opacity. There is no pleural effusion or pneumothorax. The heart and mediastinal contours are unremarkable. There is a right shoulder arthroplasty. There is an S-shaped scoliosis of the thoracolumbar spine. IMPRESSION: No active cardiopulmonary disease. Electronically Signed   By: Elige Ko   On: 08/05/2017 12:53   Ct Angio Chest Pe W Or Wo Contrast  Result Date: 08/06/2017 CLINICAL DATA:  Dyspnea.  Chronic.  Hypertension.  Never smoker. EXAM: CT ANGIOGRAPHY CHEST WITH CONTRAST TECHNIQUE: Multidetector CT imaging of the chest was performed using the standard protocol during bolus administration of intravenous contrast. Multiplanar CT image reconstructions and MIPs were obtained to evaluate the vascular anatomy. CONTRAST:  67 cc of Isovue-300 COMPARISON:  Plain films 07/28/2017  FINDINGS: Cardiovascular:  The quality of this exam for evaluation of pulmonary embolism is moderate to good. The only limitation is mild motion. The bolus is well timed. No evidence of pulmonary embolism. Aortic and branch vessel atherosclerosis. Mild cardiomegaly. Multivessel coronary artery atherosclerosis. Mediastinum/Nodes: Beam hardening artifact from right shoulder arthroplasty and lower cervical spine fixation. Degraded evaluation of the upper chest and thoracic inlet. No mediastinal or hilar adenopathy. Lungs/Pleura: No pleural fluid. Minimal motion degradation. Lower lobe predominant bronchial wall thickening is moderate. Upper Abdomen: Normal imaged portions of the liver, spleen, stomach, pancreas, adrenal glands, kidneys. Musculoskeletal: Right shoulder arthroplasty. Lower cervical spine fixation. Cervicothoracic spondylosis. Minimal superior endplate compression deformities at C2 and C4. Convex right thoracic spine curvature is moderate. Review of the MIP images confirms the above findings. IMPRESSION: 1. No evidence of pulmonary embolism. Mild limitations as detailed above. 2. Moderate lower lobe predominant bronchial wall thickening could represent chronic bronchitis. 3. Coronary artery atherosclerosis. Aortic Atherosclerosis (ICD10-I70.0). 4. Mild upper thoracic compression deformities, of indeterminate acuity. Electronically Signed   By: Jeronimo GreavesKyle  Talbot M.D.   On: 08/06/2017 15:22   Dg Chest Port 1 View  Result Date: 08/06/2017 CLINICAL DATA:  Dyspnea EXAM: PORTABLE CHEST 1 VIEW COMPARISON:  08/05/2017 FINDINGS: Normal heart size. Aortic atherosclerosis. There is no pleural effusion or edema. No airspace opacities identified. Previous right hip arthroplasty. IMPRESSION: No active disease. Electronically Signed   By: Signa Kellaylor  Stroud M.D.   On: 08/06/2017 10:56    Assessment and Plan:   1.  Elevated Troponin with abnormal EKG: Troponin 0.43.  EKG showed ST with LVH by voltage and repolarization abnormalities and widened  QRS. There is more prominent ST depression on initial EKG but HR was faster and this appears to have improved after HR slowed.  She has not experienced any chest pain and prior to URI sx had no DOE or SOB.  Trop elevation most likely related to demand ischemia in the setting of URI. -- on ASA, continue on Heparin until trop cycled -- continue to cycle troponins and move to tele bed. -- 2D echo pending - if no change in LVF and Trop trend is flat then would no pursue further inpt workup and have her followup with Dr. [name] as outpt.   2.  Shortness of breath with hypoxia: patient on abx for likely asthmatic bronchitits. Neg PE on CT angio of the chest.  3.  Bilateral carotid artery disease: repeat dopplers done February of this year. No significant obstructive dz. -- cont ASA and statin  4.  DM: Hyperglycemia - per TRH  5.  Hypertension: BP controlled   Signed, Dorthula MatasGREENE,TIFFANY G, PA-C  08/06/2017   Patient seen and independently examined with Marlon Peliffany Greene, PA. We discussed all aspects of the encounter. I agree with the assessment and plan as stated above with changes as needed to reflect accurate history and physical.  Patient well known to Dr. 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  She has a history of asthmatic bronchitis and developed a dry hacking cough this weekend and became progressively more SOB, presenting to Urgent care and was hypoxic requiring O2 and transported to ER.  Dx with asthmatic bronchitis with no PE on chest CT but CT did show coronary artery calcifications.  Trop mildly elevated in ER and likely represents demand ischemia in the setting of acute hypoxia from URI. Prior to getting sick she was very active with no  anginal symptoms.  She does have a history of mild AS.  Exam shows diffuse loud rhonchi and wheezes.  Heart RRR with 1/6 SM at RUSB.  No LE edema.  EKG on admit showed ST with LVH with repol  abnormality more accentuated than prior EKG but improved at a slower HR.  2D echo pending.  If no change in LVF and AS, would recommend outpt followup with Dr. Katrinka Clarke to consider nuclear stress test given coronary artery calcifications.  I do not think current enzyme elevation represents an ACS.  Signed: Armanda Magic, MD [facility] 08/06/2017

## 2017-08-06 NOTE — Progress Notes (Signed)
Received patient as transfer from 5W.  Patient alert, oriented, stable at this time.  Daughter-in-law at bedside.  Patient is comfortable and eating.  Telemetry placed on patient and confirmed with CMT.  Patient oriented to unit and equipment.  Will continue to monitor.

## 2017-08-06 NOTE — Progress Notes (Signed)
Inpatient Diabetes Program Recommendations  AACE/ADA: New Consensus Statement on Inpatient Glycemic Control (2015)  Target Ranges:  Prepandial:   less than 140 mg/dL      Peak postprandial:   less than 180 mg/dL (1-2 hours)      Critically ill patients:  140 - 180 mg/dL   Lab Results  Component Value Date   GLUCAP 356 (H) 08/06/2017   HGBA1C 7.9 (H) 01/04/2017    Review of Glycemic Control Results for Danielle FordyceRIVES, Sheila J (MRN [id number]) as of 08/06/2017 14:08  Ref. Range 08/05/2017 14:28 08/05/2017 16:15 08/05/2017 20:23 08/06/2017 07:47 08/06/2017 12:09  Glucose-Capillary Latest Ref Range: 65 - 99 mg/dL 409396 (H) 811458 (H) 914320 (H) 317 (H) 356 (H)   Diabetes history: DM1 Outpatient Diabetes medications: Basaglar 13 units + 2-11 units custom scale tid  Current orders for Inpatient glycemic control: Lantus 13 units + Novolog 2-11 custom correction scale  Inpatient Diabetes Program Recommendations:  Noted hyperglycemia. Please consider 3 units Novolog meal coverage tid if eats 50%.  Thank you, Billy FischerJudy E. Delorse Shane, RN, MSN, CDE  Diabetes Coordinator Inpatient Glycemic Control Team Team Pager [phone] (8am-5pm) 08/06/2017 2:10 PM

## 2017-08-06 NOTE — Progress Notes (Addendum)
[facility] PROGRESS NOTE  Danielle Clarke ONG:[id number] DOB: [birth year] DOA: 08/05/2017  PCP: Juluis RainierBarnes, [name], MD  Brief History/Interval Summary: 76 year old Caucasian female with a past medical history of type 1 diabetes, history of asthmatic bronchitis which appears to be intermittent, history of hypertension who presented to the hospital with complaints of worsening shortness of breath. She is a non-smoker. The symptoms started 2-3 days prior to admission. She's had a nonproductive cough. She apparently was found to be hypoxic at her primary care physician's office. Does not use home oxygen. She was evaluated in the ED and then subsequently hospitalized.  Reason for Visit: Dyspnea  Consultants: None  Procedures: None yet  Antibiotics: Azithromycin  Subjective/Interval History: Patient states that she is not feeling much better. Continues to have shortness of breath. She continues to have a dry cough. Denies any chest pain. Not a very good historian. Some nausea this morning. Denies any leg swelling.  ROS: No palpitations.  Objective:  Vital Signs  Vitals:   08/05/17 1444 08/05/17 1608 08/05/17 2029 08/06/17 0512  BP: 134/82 (!) 131/40 (!) 138/58 (!) 157/49  Pulse: (!) 102 97 87 79  Resp: (!) 21 16 18 18   Temp:  100 F (37.8 C) 99.5 F (37.5 C) 98.4 F (36.9 C)  TempSrc:  Oral Oral Oral  SpO2: 99% 97%  100%  Weight:      Height:        Intake/Output Summary (Last 24 hours) at 08/06/17 1109 Last data filed at 08/06/17 1059  Gross per 24 hour  Intake             1120 ml  Output             1440 ml  Net             -320 ml   Filed Weights   08/05/17 1201  Weight: 51.8 kg (114 lb 2 oz)    General appearance: alert, cooperative, appears stated age, no distress and Anxious and distracted Head: Normocephalic, without obvious abnormality, atraumatic Neck: no adenopathy, no carotid bruit, no JVD, supple, symmetrical, trachea midline and thyroid not  enlarged, symmetric, no tenderness/mass/nodules Resp: Crackles heard bilateral bases. No wheezing or rhonchi. Noted to be tachypneic with some use of accessory muscles while speaking. Cardio: S1, S2 is normal, regular. Systolic murmur appreciated over the aortic area. No obvious JVD. No significant pitting edema lower extremities. GI: soft, non-tender; bowel sounds normal; no masses,  no organomegaly Extremities: extremities normal, atraumatic, no cyanosis or edema Neurologic: Awake, alert. Anxious. Oriented to person, place. No obvious focal neurological deficits.  Lab Results:  Data Reviewed: I have personally reviewed following labs and imaging studies  CBC:  Recent Labs Lab 08/05/17 1228 08/06/17 0441  WBC 16.2* 9.0  NEUTROABS 15.2*  --   HGB 11.9* 10.1*  HCT 35.6* 30.9*  MCV 90.6 88.5  PLT 254 189    Basic Metabolic Panel:  Recent Labs Lab 08/05/17 1228 08/06/17 0441  NA 135 138  K 5.3* 4.6  CL 97* 100*  CO2 27 26  GLUCOSE 396* 339*  BUN 17 14  CREATININE 0.95 0.91  CALCIUM 9.1 8.4*    GFR: Estimated Creatinine Clearance: 42.2 mL/min (by C-G formula based on SCr of 0.91 mg/dL).  CBG:  Recent Labs Lab 08/05/17 1428 08/05/17 1615 08/05/17 2023 08/06/17 0747  GLUCAP 396* 458* 320* 317*    Radiology Studies: Dg Chest 2 View  Result Date: 08/05/2017 CLINICAL DATA:  Productive cough  since yesterday. EXAM: CHEST  2 VIEW COMPARISON:  08/05/2017 FINDINGS: There is no focal parenchymal opacity. There is no pleural effusion or pneumothorax. The heart and mediastinal contours are unremarkable. There is a right shoulder arthroplasty. There is an S-shaped scoliosis of the thoracolumbar spine. IMPRESSION: No active cardiopulmonary disease. Electronically Signed   By: Elige Ko   On: 08/05/2017 12:53   Dg Chest Port 1 View  Result Date: 08/06/2017 CLINICAL DATA:  Dyspnea EXAM: PORTABLE CHEST 1 VIEW COMPARISON:  08/05/2017 FINDINGS: Normal heart size. Aortic  atherosclerosis. There is no pleural effusion or edema. No airspace opacities identified. Previous right hip arthroplasty. IMPRESSION: No active disease. Electronically Signed   By: Signa Kell M.D.   On: 08/06/2017 10:56     Medications:  Scheduled: . acetaminophen  500 mg Oral TID  . aspirin EC  81 mg Oral Daily  . cholecalciferol  1,000 Units Oral Daily  . enoxaparin (LOVENOX) injection  40 mg Subcutaneous Q24H  . insulin aspart  2-11 Units Subcutaneous TID AC  . insulin glargine  13 Units Subcutaneous Daily  . magnesium oxide  400 mg Oral BID  . saccharomyces boulardii  250 mg Oral BID  . simvastatin  40 mg Oral QPM  . traMADol  50 mg Oral BID   Continuous: . azithromycin     ZOX:[id number], ondansetron (ZOFRAN) IV  Assessment/Plan:  Active Problems:   Bronchitis    Dyspnea. Thought to be secondary to bronchitis. However, it appears the patient was quite hypoxic when she was evaluated at her PCPs office. CXR was surprisingly without any clearcut abnormality. Does not appear that she was wheezing profusely. She was placed on azithromycin. She continues to be dyspneic with occasional use of accessory muscles. Chest x-ray repeated today and also does not show any acute findings. EKG reviewed and showed nonspecific changes which could be related to tachycardia but there is some concern for ST segment changes in the lateral leads. We will check troponin, BNP, repeat EKG. Last echocardiogram was done in February which showed normal systolic function and mild aortic stenosis. We will proceed with CT angiogram chest.  History of bilateral carotid artery disease. Followed by cardiology. Continue aspirin, statin.  History of diabetes mellitus type 1. Noted to be hyperglycemic. She is continued on her home medication regimen. Continue to monitor CBGs. HbA1c 7.9 in February.  Known history of pulmonary hypertension. This is also monitored by her  cardiologist.  ADDENDUM Repeat EKG shows resolution of some of the changes. Troponin 0.43. CT chest did not show PE. No clear explanation for her symptoms. She could have experienced angina equivalent. Will trend troponin. Start heparin for now. Consult cardiology. On aspirin. Transfer to tele.  DVT Prophylaxis: Lovenox    Code Status: Full code  Family Communication: Discussed with the patient. No family at bedside  Disposition Plan: Management as outlined above.    LOS: 1 day   [facility]  [facility] Pager [phone] 08/06/2017, 11:09 AM  If 7PM-7AM, please contact night-coverage at [email or web address], password [email or web address]    Crackles heard bilateral bases. No wheezing or rhonchi. Noted to be tachypneic with some use of accessory muscles while speaking.

## 2017-08-06 NOTE — Progress Notes (Signed)
CRITICAL VALUE ALERT  Critical Value: 0.43  Date & Time Notied: 1504  Provider Notified: Kaylyn LayerYes, Krishnan  Orders Received/Actions taken: None. Will continue to monitor

## 2017-08-06 NOTE — Progress Notes (Signed)
ANTICOAGULATION CONSULT NOTE - Initial Consult  Pharmacy Consult for Heparin Indication: chest pain/ACS  Allergies  Allergen Reactions  . Cefaclor Anaphylaxis  . Tetracycline Anaphylaxis  . Vancomycin Anaphylaxis  . Augmentin [Amoxicillin-Pot Clavulanate] Other (See Comments)    Reaction unknown  . Prednisone Other (See Comments)    Reaction unknown    Patient Measurements: Height: 5\' 2"  (157.5 cm) Weight: 114 lb 2 oz (51.8 kg) IBW/kg (Calculated) : 50.1 Heparin Dosing Weight: actual body weight  Vital Signs: Temp: 98.8 F (37.1 C) (09/10 1442) Temp Source: Oral (09/10 1442) BP: 136/51 (09/10 1442) Pulse Rate: 68 (09/10 1442)  Labs:  Recent Labs  08/05/17 1228 08/06/17 0441 08/06/17 1345  HGB 11.9* 10.1*  --   HCT 35.6* 30.9*  --   PLT 254 189  --   CREATININE 0.95 0.91  --   TROPONINI  --   --  0.43*    Estimated Creatinine Clearance: 42.2 mL/min (by C-G formula based on SCr of 0.91 mg/dL).   Medical History: Past Medical History:  Diagnosis Date  . Arthritis   . Asthmatic bronchitis    with colds per patient  . Cataract   . Glaucoma   . Hyperlipemia   . Hypertension    Dr. [name] ~ 2 years ago  . Neck fracture [facility](HCC)    july 2013  . Osteoporosis   . Type 1 diabetes mellitus (HCC)     Medications:  Scheduled:  . acetaminophen  500 mg Oral TID  . aspirin EC  81 mg Oral Daily  . azithromycin  500 mg Oral Daily  . cholecalciferol  1,000 Units Oral Daily  . heparin  2,500 Units Intravenous Once  . insulin aspart  0-15 Units Subcutaneous TID WC  . insulin aspart  0-5 Units Subcutaneous QHS  . insulin aspart  2-11 Units Subcutaneous TID AC  . [START ON 08/07/2017] insulin glargine  18 Units Subcutaneous Daily  . ipratropium-albuterol  3 mL Nebulization TID  . magnesium oxide  400 mg Oral BID  . saccharomyces boulardii  250 mg Oral BID  . simvastatin  40 mg Oral QPM  . traMADol  50 mg Oral BID   Infusions:  . heparin      Assessment:  [age]yr  female admitted on 08/05/17 with bronchitis.  PMH significant for DM, HTN, asthmatic bronchitis.    Today, EKG concerning for ST segment changes  Troponin checked = 0.43  Pharmacy consulted to dose IV heparin for ACS/STEMI  PTA on no oral anticoagulation  Upon admission, Lovenox 40mg  sq q24h ordered for VTE prophylaxis.  Last dose of Lovenox given on 08/05/18 @ 22:44  Goal of Therapy:  Monitor platelets by anticoagulation protocol: Yes   Plan:   Heparin 2500 unit IV bolus x 1 followed by heparin infusion @ 600 units/hr  Check heparin level 8 hr after heparin started  Follow heparin level and CBC daily   Markiah Janeway, Joselyn GlassmanLeann Trefz, PharmD 08/06/2017,4:10 PM

## 2017-08-06 NOTE — Progress Notes (Signed)
Given critical lab value for Troponin by lab at 1505. Paged Dr. Adela PortsKnishnan at [phone] with no response. A repeat page was sent at [phone], with no response. Rapid response was called at 1535 in order to see what steps should be taken in due to unanswered page. Rapid response nurse Pam said she would try to contact the doctor and call back. Shortly after hanging up with her Dr. Adela PortsKnishnan arrived to the unit and went into patient's room.

## 2017-08-07 ENCOUNTER — Inpatient Hospital Stay (HOSPITAL_COMMUNITY): Payer: Medicare Other

## 2017-08-07 DIAGNOSIS — Z794 Long term (current) use of insulin: Secondary | ICD-10-CM

## 2017-08-07 DIAGNOSIS — I361 Nonrheumatic tricuspid (valve) insufficiency: Secondary | ICD-10-CM

## 2017-08-07 DIAGNOSIS — I340 Nonrheumatic mitral (valve) insufficiency: Secondary | ICD-10-CM

## 2017-08-07 DIAGNOSIS — R748 Abnormal levels of other serum enzymes: Secondary | ICD-10-CM

## 2017-08-07 DIAGNOSIS — E119 Type 2 diabetes mellitus without complications: Secondary | ICD-10-CM

## 2017-08-07 DIAGNOSIS — I350 Nonrheumatic aortic (valve) stenosis: Secondary | ICD-10-CM

## 2017-08-07 DIAGNOSIS — D649 Anemia, unspecified: Secondary | ICD-10-CM

## 2017-08-07 LAB — CBC
HCT: 29 % — ABNORMAL LOW (ref 36.0–46.0)
Hemoglobin: 9.7 g/dL — ABNORMAL LOW (ref 12.0–15.0)
MCH: 29.9 pg (ref 26.0–34.0)
MCHC: 33.4 g/dL (ref 30.0–36.0)
MCV: 89.5 fL (ref 78.0–100.0)
Platelets: 179 10*3/uL (ref 150–400)
RBC: 3.24 MIL/uL — ABNORMAL LOW (ref 3.87–5.11)
RDW: 14.8 % (ref 11.5–15.5)
WBC: 5.9 10*3/uL (ref 4.0–10.5)

## 2017-08-07 LAB — GLUCOSE, CAPILLARY
Glucose-Capillary: 143 mg/dL — ABNORMAL HIGH (ref 65–99)
Glucose-Capillary: 173 mg/dL — ABNORMAL HIGH (ref 65–99)
Glucose-Capillary: 57 mg/dL — ABNORMAL LOW (ref 65–99)
Glucose-Capillary: 61 mg/dL — ABNORMAL LOW (ref 65–99)
Glucose-Capillary: 120 mg/dL — ABNORMAL HIGH (ref 65–99)
Glucose-Capillary: 301 mg/dL — ABNORMAL HIGH (ref 65–99)

## 2017-08-07 LAB — TROPONIN I: Troponin I: 0.27 ng/mL (ref <0.03)

## 2017-08-07 LAB — HEPARIN LEVEL (UNFRACTIONATED)
Heparin Unfractionated: 0.17 IU/mL — ABNORMAL LOW (ref 0.30–0.70)
Heparin Unfractionated: 0.24 IU/mL — ABNORMAL LOW (ref 0.30–0.70)

## 2017-08-07 LAB — ECHOCARDIOGRAM COMPLETE
Height: 62 in
Weight: 1826 oz

## 2017-08-07 MED ORDER — HEPARIN (PORCINE) IN NACL 100-0.45 UNIT/ML-% IJ SOLN: 800.0000 [IU]/h | INTRAMUSCULAR | Status: DC

## 2017-08-07 MED ORDER — GUAIFENESIN 100 MG/5ML PO SOLN: 5.0000 mL | ORAL | Status: DC | PRN

## 2017-08-07 MED ORDER — GUAIFENESIN ER 600 MG PO TB12
600.0000 mg | ORAL_TABLET | Freq: Two times a day (BID) | ORAL | Status: DC
  Administered 2017-08-07 – 2017-08-08 (×3): 600 mg via ORAL
  Filled 2017-08-07 (×3): qty 1

## 2017-08-07 MED ORDER — ENOXAPARIN SODIUM 40 MG/0.4ML ~~LOC~~ SOLN
40.0000 mg | SUBCUTANEOUS | Status: DC
  Administered 2017-08-08: 40 mg via SUBCUTANEOUS
  Filled 2017-08-07: qty 0.4

## 2017-08-07 MED ORDER — LORATADINE 10 MG PO TABS
10.0000 mg | ORAL_TABLET | Freq: Every day | ORAL | Status: DC
  Administered 2017-08-07 – 2017-08-08 (×2): 10 mg via ORAL
  Filled 2017-08-07 (×2): qty 1

## 2017-08-07 NOTE — Progress Notes (Signed)
ANTICOAGULATION CONSULT NOTE - Follow Up Consult  Pharmacy Consult for Heparin Indication: chest pain/ACS  Allergies  Allergen Reactions  . Cefaclor Anaphylaxis  . Tetracycline Anaphylaxis  . Vancomycin Anaphylaxis  . Augmentin [Amoxicillin-Pot Clavulanate] Other (See Comments)    Reaction unknown  . Prednisone Other (See Comments)    Reaction unknown    Patient Measurements: Height: 5\' 2"  (157.5 cm) Weight: 114 lb 2 oz (51.8 kg) IBW/kg (Calculated) : 50.1 Heparin Dosing Weight:   Vital Signs: Temp: 98.5 F (36.9 C) (09/10 2146) Temp Source: Oral (09/10 2146) BP: 121/41 (09/10 2146) Pulse Rate: 73 (09/10 2146)  Labs:  Recent Labs  08/05/17 1228 08/06/17 0441 08/06/17 1345 08/06/17 2006 08/07/17 0255  HGB 11.9* 10.1*  --   --  9.7*  HCT 35.6* 30.9*  --   --  29.0*  PLT 254 189  --   --  179  HEPARINUNFRC  --   --   --   --  0.17*  CREATININE 0.95 0.91  --   --   --   TROPONINI  --   --  0.43* 0.34*  --     Estimated Creatinine Clearance: 42.2 mL/min (by C-G formula based on SCr of 0.91 mg/dL).   Medications:  Infusions:  . heparin 600 Units/hr (08/06/17 1742)    Assessment: Patient with low heparin level.  No heparin issues noted.  Goal of Therapy:  Heparin level 0.3-0.7 units/ml Monitor platelets by anticoagulation protocol: Yes   Plan:  Increase heparin to 700 units/hr Recheck level at [address]1200  Telena Peyser Jr, ShiroJulian Crowford 08/07/2017,3:41 AM

## 2017-08-07 NOTE — Progress Notes (Signed)
  Echocardiogram 2D Echocardiogram has been performed.  Kieron Kantner L Androw 08/07/2017, 1:59 PM

## 2017-08-07 NOTE — Progress Notes (Signed)
PT Cancellation Note  Patient Details Name: Saul FordyceJacqueline J Ellinwood MRN: [id number] DOB: 07/20/1941   Cancelled Treatment:    Reason Eval/Treat Not Completed: Patient at procedure or test/unavailable   Rada HayHill, Evaleen Sant [name] 08/07/2017, 2:49 PM Blanchard KelchKaren Keaundre Thelin PT [phone]

## 2017-08-07 NOTE — Progress Notes (Signed)
[facility] PROGRESS NOTE  Danielle Clarke ZOX:[id number] DOB: 12-10-1940 DOA: 08/05/2017  PCP: Danielle Clarke, [name], MD  Brief History/Interval Summary: 76 year old Caucasian female with a past medical history of type 1 diabetes, history of asthmatic bronchitis which appears to be intermittent, history of hypertension who presented to the hospital with complaints of worsening shortness of breath. She is a non-smoker. The symptoms started 2-3 days prior to admission. She's had a nonproductive cough. She apparently was found to be hypoxic at her primary care physician's office. Does not use home oxygen. She was evaluated in the ED and then subsequently hospitalized.  Reason for Visit: Dyspnea  Consultants: Cardiology  Procedures:  Transthoracic echocardiogram is pending  Antibiotics: Azithromycin  Subjective/Interval History: Patient feels slightly better this morning. Continues to have a cough with cream-colored sputum. Denies any blood in the sputum. No chest pain. Continues to have wheezing and congestion which is better compared to yesterday.   ROS: Denies any headaches.  Objective:  Vital Signs  Vitals:   08/06/17 1820 08/06/17 2028 08/06/17 2146 08/07/17 0515  BP: (!) 128/46  (!) 121/41 (!) 143/58  Pulse: 73  73 76  Resp: 18  18 16   Temp: 98.4 F (36.9 C)  98.5 F (36.9 C) 97.6 F (36.4 C)  TempSrc: Oral  Oral Oral  SpO2: 100% 100% 99% 100%  Weight:      Height:        Intake/Output Summary (Last 24 hours) at 08/07/17 0804 Last data filed at 08/07/17 0600  Gross per 24 hour  Intake          1076.07 ml  Output              750 ml  Net           326.07 ml   Filed Weights   08/05/17 1201  Weight: 51.8 kg (114 lb 2 oz)    General appearance: Awake, alert. Somewhat distracted. No distress Resp: Slightly better aeration today compared to yesterday. Still mildly tachypneic without any use of accessory muscles. Scattered wheezes appreciated on examination.  Few crackles at the bases.  Cardio: S1, S2 is normal, regular. No S3, S4. Systolic murmur appreciated over the aortic area. No JVD. No edema. GI: Abdomen is soft. Nontender, nondistended. Bowel sounds are present. No masses or organomegaly Extremities: No edema Neurologic: Awake, alert. Anxious. No focal neurological deficits  Lab Results:  Data Reviewed: I have personally reviewed following labs and imaging studies  CBC:  Recent Labs Lab 08/05/17 1228 08/06/17 0441 08/07/17 0255  WBC 16.2* 9.0 5.9  NEUTROABS 15.2*  --   --   HGB 11.9* 10.1* 9.7*  HCT 35.6* 30.9* 29.0*  MCV 90.6 88.5 89.5  PLT 254 189 179    Basic Metabolic Panel:  Recent Labs Lab 08/05/17 1228 08/06/17 0441  NA 135 138  K 5.3* 4.6  CL 97* 100*  CO2 27 26  GLUCOSE 396* 339*  BUN 17 14  CREATININE 0.95 0.91  CALCIUM 9.1 8.4*    GFR: Estimated Creatinine Clearance: 42.2 mL/min (by C-G formula based on SCr of 0.91 mg/dL).  CBG:  Recent Labs Lab 08/06/17 0747 08/06/17 1209 08/06/17 1651 08/06/17 2144 08/07/17 0746  GLUCAP 317* 356* 155* 199* 143*    Radiology Studies: Dg Chest 2 View  Result Date: 08/05/2017 CLINICAL DATA:  Productive cough since yesterday. EXAM: CHEST  2 VIEW COMPARISON:  08/05/2017 FINDINGS: There is no focal parenchymal opacity. There is no pleural effusion or pneumothorax.  The heart and mediastinal contours are unremarkable. There is a right shoulder arthroplasty. There is an S-shaped scoliosis of the thoracolumbar spine. IMPRESSION: No active cardiopulmonary disease. Electronically Signed   By: Elige Ko   On: 08/05/2017 12:53   Ct Angio Chest Pe W Or Wo Contrast  Result Date: 08/06/2017 CLINICAL DATA:  Dyspnea.  Chronic.  Hypertension.  Never smoker. EXAM: CT ANGIOGRAPHY CHEST WITH CONTRAST TECHNIQUE: Multidetector CT imaging of the chest was performed using the standard protocol during bolus administration of intravenous contrast. Multiplanar CT image  reconstructions and MIPs were obtained to evaluate the vascular anatomy. CONTRAST:  67 cc of Isovue-300 COMPARISON:  Plain films 07/28/2017 FINDINGS: Cardiovascular: The quality of this exam for evaluation of pulmonary embolism is moderate to good. The only limitation is mild motion. The bolus is well timed. No evidence of pulmonary embolism. Aortic and branch vessel atherosclerosis. Mild cardiomegaly. Multivessel coronary artery atherosclerosis. Mediastinum/Nodes: Beam hardening artifact from right shoulder arthroplasty and lower cervical spine fixation. Degraded evaluation of the upper chest and thoracic inlet. No mediastinal or hilar adenopathy. Lungs/Pleura: No pleural fluid. Minimal motion degradation. Lower lobe predominant bronchial wall thickening is moderate. Upper Abdomen: Normal imaged portions of the liver, spleen, stomach, pancreas, adrenal glands, kidneys. Musculoskeletal: Right shoulder arthroplasty. Lower cervical spine fixation. Cervicothoracic spondylosis. Minimal superior endplate compression deformities at C2 and C4. Convex right thoracic spine curvature is moderate. Review of the MIP images confirms the above findings. IMPRESSION: 1. No evidence of pulmonary embolism. Mild limitations as detailed above. 2. Moderate lower lobe predominant bronchial wall thickening could represent chronic bronchitis. 3. Coronary artery atherosclerosis. Aortic Atherosclerosis (ICD10-I70.0). 4. Mild upper thoracic compression deformities, of indeterminate acuity. Electronically Signed   By: Jeronimo Greaves M.D.   On: 08/06/2017 15:22   Dg Chest Port 1 View  Result Date: 08/06/2017 CLINICAL DATA:  Dyspnea EXAM: PORTABLE CHEST 1 VIEW COMPARISON:  08/05/2017 FINDINGS: Normal heart size. Aortic atherosclerosis. There is no pleural effusion or edema. No airspace opacities identified. Previous right hip arthroplasty. IMPRESSION: No active disease. Electronically Signed   By: Signa Kell M.D.   On: 08/06/2017 10:56      Medications:  Scheduled: . acetaminophen  500 mg Oral TID  . aspirin EC  81 mg Oral Daily  . azithromycin  500 mg Oral Daily  . cholecalciferol  1,000 Units Oral Daily  . guaiFENesin  600 mg Oral BID  . insulin aspart  0-15 Units Subcutaneous TID WC  . insulin aspart  0-5 Units Subcutaneous QHS  . insulin glargine  18 Units Subcutaneous Daily  . magnesium oxide  400 mg Oral BID  . saccharomyces boulardii  250 mg Oral BID  . simvastatin  40 mg Oral QPM  . traMADol  50 mg Oral BID   Continuous: . heparin 700 Units/hr (08/07/17 0344)   WUJ:[id number], ipratropium-albuterol, menthol-cetylpyridinium, ondansetron (ZOFRAN) IV  Assessment/Plan:  Active Problems:   Bronchitis   Elevated troponin   Abnormal EKG    Dyspnea/Acute bronchitis/elevated troponin Symptoms initially thought to be secondary to acute bronchitis. However, patient did not improve much after she was given nebulizer treatments. Symptoms also seemed out of proportion to exam. So, she underwent CT scan of her chest which did not show any PE. Troponin was noted to be mildly elevated. She had nonspecific changes on her EKG on admission which resolved the following day. Cardiology was consulted. Appreciate their assistance. Patient was started on aspirin, IV heparin. Plan is for echocardiogram today. Patient was also started  on nebulizer treatments. She reports a significant allergy to prednisone. Patient does have mild aortic stenosis based on echocardiogram from February.   History of bilateral carotid artery disease. Followed by cardiology. Continue aspirin, statin.  History of diabetes mellitus type 1. CBGs are better controlled today. Continue Lantus. HbA1c was 7.9 in February.   Normocytic anemia. No evidence for overt bleeding. Continue to monitor closely since she is on heparin.  Known history of pulmonary hypertension. This is also monitored by her cardiologist.   DVT Prophylaxis: Lovenox    Code  Status: Full code  Family Communication: Discussed with the patient. No family at bedside  Disposition Plan: Management as outlined above. PT and OT evaluation.    LOS: 2 days   [facility]  [facility] Pager [phone] 08/07/2017, 8:04 AM  If 7PM-7AM, please contact night-coverage at [email or web address], password [email or web address]    Crackles heard bilateral bases. No wheezing or rhonchi. Noted to be tachypneic with some use of accessory muscles while speaking.

## 2017-08-07 NOTE — Progress Notes (Signed)
ANTICOAGULATION CONSULT NOTE - Follow-Up Consult  Pharmacy Consult for Heparin Indication: chest pain/ACS  Allergies  Allergen Reactions  . Cefaclor Anaphylaxis  . Tetracycline Anaphylaxis  . Vancomycin Anaphylaxis  . Augmentin [Amoxicillin-Pot Clavulanate] Other (See Comments)    Reaction unknown  . Prednisone Other (See Comments)    Reaction unknown    Patient Measurements: Height: 5\' 2"  (157.5 cm) Weight: 114 lb 2 oz (51.8 kg) IBW/kg (Calculated) : 50.1 Heparin Dosing Weight: actual body weight  Vital Signs: Temp: 97.6 F (36.4 C) (09/11 0515) Temp Source: Oral (09/11 0515) BP: 143/58 (09/11 0515) Pulse Rate: 76 (09/11 0515)  Labs:  Recent Labs  08/05/17 1228 08/06/17 0441 08/06/17 1345 08/06/17 2006 08/07/17 0255 08/07/17 1200  HGB 11.9* 10.1*  --   --  9.7*  --   HCT 35.6* 30.9*  --   --  29.0*  --   PLT 254 189  --   --  179  --   HEPARINUNFRC  --   --   --   --  0.17* 0.24*  CREATININE 0.95 0.91  --   --   --   --   TROPONINI  --   --  0.43* 0.34* 0.27*  --     Estimated Creatinine Clearance: 42.2 mL/min (by C-G formula based on SCr of 0.91 mg/dL).   Medical History: Past Medical History:  Diagnosis Date  . Arthritis   . Asthmatic bronchitis    with colds per patient  . Cataract   . Glaucoma   . Hyperlipemia   . Hypertension    Dr. [name] ~ 2 years ago  . Neck fracture [facility](HCC)    july 2013  . Osteoporosis   . Type 1 diabetes mellitus [facility](HCC)     Assessment:  [age]yr female admitted on 08/05/17 with bronchitis.  PMH significant for DM, HTN, asthmatic bronchitis.    9/10: EKG concerning for ST segment changes  Troponin checked = 0.43  Pharmacy consulted to dose IV heparin for possible ACS  Not on any anticoagulation PTA   Upon admission, Lovenox 40mg  SQ q24h ordered for VTE prophylaxis.  Last dose of Lovenox given on 08/05/18 @ 22:44   Today, 08/07/17   Heparin level at 1200 = 0.24 units/mL, subtherapeutic despite rate increase this  morning  Troponins 0.43 > 0.34 > 0.27  CBC: Hgb decreased to 9.7, Pltc WNL  No bleeding or line issues reported per nursing  Goal of Therapy:  Heparin level 0.3-0.7 units/mL Monitor platelets by anticoagulation protocol: Yes   Plan:   Increase heparin infusion to 800 units/hr  Check heparin level 8 hours after rate change  Daily CBC and heparin level while on heparin infusion  Monitor closely for s/sx of bleeding  F/u cardiology recommendations   Greer PickerelJigna Verna Hamon, PharmD, BCPS Pager: [phone] 08/07/2017 1:08 PM

## 2017-08-07 NOTE — Care Management Note (Signed)
Case Management Note  Patient Details  Name: Danielle Clarke MRN: [id number] Date of Birth: January 23, 1941  Subjective/Objective:76 y/o f admitted w/Bronchitis. From home. Cardiology following-elevated trops-iv heparin. On 02-monitor if needed @ home.                    Action/Plan:d/c plan home.   Expected Discharge Date:                  Expected Discharge Plan:  Home/Self Care  In-House Referral:     Discharge planning Services  CM Consult  Post Acute Care Choice:    Choice offered to:     DME Arranged:    DME Agency:     HH Arranged:    HH Agency:     Status of Service:  In process, will continue to follow  If discussed at Long Length of Stay Meetings, dates discussed:    Additional Comments:  Lanier ClamMahabir, Ahna Konkle, RN 08/07/2017, 12:23 PM

## 2017-08-07 NOTE — Progress Notes (Addendum)
Hypoglycemic Event  CBG: 57  Treatment: 15 GM carbohydrate snack  Symptoms: None   CBG Result:121 (Rechecked after patient ate dinner)  Possible Reasons for Event: Unknown  Comments/MD notified:Krishnan    Vonya Ohalloran, Jonelle SidleKatelyn G

## 2017-08-07 NOTE — Progress Notes (Signed)
2D echo showd normal LVF with G2 DD and moderate MR, mild AS.  Trop elevation c/w demand ischemia from URI and asthmatic bronchitis.  No further ischemic workup in patient.  Please have patient followup with Dr. [name] outpt.  We will set up appt.

## 2017-08-08 DIAGNOSIS — J40 Bronchitis, not specified as acute or chronic: Secondary | ICD-10-CM

## 2017-08-08 LAB — CBC
HCT: 33 % — ABNORMAL LOW (ref 36.0–46.0)
Hemoglobin: 10.8 g/dL — ABNORMAL LOW (ref 12.0–15.0)
MCH: 30 pg (ref 26.0–34.0)
MCHC: 32.7 g/dL (ref 30.0–36.0)
MCV: 91.7 fL (ref 78.0–100.0)
Platelets: 209 10*3/uL (ref 150–400)
RBC: 3.6 MIL/uL — ABNORMAL LOW (ref 3.87–5.11)
RDW: 14.8 % (ref 11.5–15.5)
WBC: 5.2 10*3/uL (ref 4.0–10.5)

## 2017-08-08 LAB — BASIC METABOLIC PANEL
Anion gap: 7 (ref 5–15)
BUN: 13 mg/dL (ref 6–20)
CO2: 32 mmol/L (ref 22–32)
Calcium: 8.7 mg/dL — ABNORMAL LOW (ref 8.9–10.3)
Chloride: 103 mmol/L (ref 101–111)
Creatinine, Ser: 0.7 mg/dL (ref 0.44–1.00)
GFR calc Af Amer: >60 mL/min (ref >60)
GFR calc non Af Amer: >60 mL/min (ref >60)
Glucose, Bld: 48 mg/dL — ABNORMAL LOW (ref 65–99)
Potassium: 4.9 mmol/L (ref 3.5–5.1)
Sodium: 142 mmol/L (ref 135–145)

## 2017-08-08 LAB — GLUCOSE, CAPILLARY
Glucose-Capillary: 82 mg/dL (ref 65–99)
Glucose-Capillary: 340 mg/dL — ABNORMAL HIGH (ref 65–99)

## 2017-08-08 MED ORDER — IPRATROPIUM BROMIDE HFA 17 MCG/ACT IN AERS: 1.0000 | INHALATION_SPRAY | Freq: Three times a day (TID) | RESPIRATORY_TRACT | 2 refills | Status: DC

## 2017-08-08 MED ORDER — GUAIFENESIN ER 600 MG PO TB12: 600.0000 mg | ORAL_TABLET | Freq: Two times a day (BID) | ORAL | 0 refills | Status: DC

## 2017-08-08 MED ORDER — AZITHROMYCIN 500 MG PO TABS: ORAL_TABLET | ORAL | 0 refills | Status: DC

## 2017-08-08 MED ORDER — LORATADINE 10 MG PO TABS: 10.0000 mg | ORAL_TABLET | Freq: Every day | ORAL | 0 refills | Status: DC

## 2017-08-08 MED ORDER — ALBUTEROL SULFATE HFA 108 (90 BASE) MCG/ACT IN AERS: 2.0000 | INHALATION_SPRAY | Freq: Four times a day (QID) | RESPIRATORY_TRACT | 2 refills | Status: DC | PRN

## 2017-08-08 MED ORDER — INSULIN GLARGINE 100 UNIT/ML ~~LOC~~ SOLN: 13.0000 [IU] | Freq: Every day | SUBCUTANEOUS | Status: DC

## 2017-08-08 MED ORDER — SACCHAROMYCES BOULARDII 250 MG PO CAPS: 250.0000 mg | ORAL_CAPSULE | Freq: Two times a day (BID) | ORAL | 0 refills | Status: DC

## 2017-08-08 MED ORDER — GUAIFENESIN 100 MG/5ML PO SOLN: 5.0000 mL | ORAL | 0 refills | Status: DC | PRN

## 2017-08-08 NOTE — Progress Notes (Signed)
OT Cancellation Note  Patient Details Name: Saul FordyceJacqueline J Sindoni MRN: [id number] DOB: Jan 30, 1941   Cancelled Treatment:    Reason Eval/Treat Not Completed: OT screened, no needs identified, will sign off  Shelagh Rayman, Metro KungLorraine D 08/08/2017, 2:07 PM

## 2017-08-08 NOTE — Care Management Note (Signed)
Case Management Note  Patient Details  Name: Danielle Clarke MRN: [id number] Date of Birth: Feb 28, 1941  Subjective/Objective:  76 y/o f admitted w/Bronchitis. From home. PT/OT-no f/u. No CM needs.                  Action/Plan:d/c home.   Expected Discharge Date:  08/08/17               Expected Discharge Plan:  Home/Self Care  In-House Referral:     Discharge planning Services  CM Consult  Post Acute Care Choice:    Choice offered to:     DME Arranged:    DME Agency:     HH Arranged:    HH Agency:     Status of Service:  Completed, signed off  If discussed at MicrosoftLong Length of Stay Meetings, dates discussed:    Additional Comments:  Lanier ClamMahabir, Laelah Siravo, RN 08/08/2017, 2:51 PM

## 2017-08-08 NOTE — Care Management Important Message (Signed)
Important Message  Patient Details  Name: Danielle Clarke MRN: [id number] Date of Birth: Apr 02, 1941   Medicare Important Message Given:  Yes    Caren MacadamFuller, Ayomikun Starling 08/08/2017, 11:25 AMImportant Message  Patient Details  Name: Danielle Clarke MRN: [id number] Date of Birth: Apr 02, 1941   Medicare Important Message Given:  Yes    Caren MacadamFuller, Deara Bober 08/08/2017, 11:25 AM

## 2017-08-08 NOTE — Progress Notes (Signed)
Patient oxygen saturation is 96 percent on RA at rest.  Will continue to monitor.

## 2017-08-08 NOTE — Evaluation (Signed)
Physical Therapy Evaluation Patient Details Name: Danielle Clarke MRN: [id number] DOB: 27-Jul-1941 Today's Date: Clarke   History of Present Illness  76 year old Caucasian female with a past medical history of type 1 diabetes, history of asthmatic bronchitis which appears to be intermittent, history of hypertension who presented to the hospital with complaints of worsening shortness of breath and admitted for acute bronchitis  Clinical Impression  Patient evaluated by Physical Therapy with no further acute PT needs identified. All education has been completed and the patient has no further questions.  Pt up in bathroom on arrival.  Pt ambulated good distance in hallway and Spo2 remained 91-93% on room air.  Pt reports her daughter will be staying with her a few days upon d/c. See below for any follow-up Physical Therapy or equipment needs. PT is signing off. Thank you for this referral.     Follow Up Recommendations No PT follow up;Supervision - Intermittent    Equipment Recommendations  None recommended by PT    Recommendations for Other Services       Precautions / Restrictions Precautions Precautions: Fall Restrictions Weight Bearing Restrictions: No      Mobility  Bed Mobility                  Transfers                 General transfer comment: pt up in bathroom on arrival - had used pull cord  Ambulation/Gait Ambulation/Gait assistance: Min guard Ambulation Distance (Feet): 350 Feet Assistive device: None Gait Pattern/deviations: Step-through pattern;Decreased stride length Gait velocity: decreased however pt reports slow pace at baseline   General Gait Details: LLD discrepancy causing increased trunk lateral flexion at times (pt has shoe lift at home), no unsteadiness observed, pt pushed dynamap, Spo2 91-93% on room air  Stairs            Wheelchair Mobility    Modified Rankin (Stroke Patients Only)       Balance Overall balance  assessment: Needs assistance (denies recent falls)         Standing balance support: No upper extremity supported Standing balance-Leahy Scale: Fair                               Pertinent Vitals/Pain Pain Assessment: No/denies pain    Home Living Family/patient expects to be discharged to:: Private residence Living Arrangements: Alone Available Help at Discharge: Family;Available PRN/intermittently;Friend(s) Type of Home: House Home Access: Stairs to enter Entrance Stairs-Rails: None Entrance Stairs-Number of Steps: 2 Home Layout: One level Home Equipment: Cane - single point;Wheelchair - Economist - 2 wheels Additional Comments: daughter to stay with pt a few days upon d/c    Prior Function Level of Independence: Independent         Comments: has canes stored in car and near her door but does not prefer to use, daughter assists as needed     Hand Dominance        Extremity/Trunk Assessment        Lower Extremity Assessment Lower Extremity Assessment: Overall WFL for tasks assessed (reports LLD due to scoliosis)    Cervical / Trunk Assessment Cervical / Trunk Assessment: Other exceptions;Kyphotic Cervical / Trunk Exceptions: hx scoliosis  Communication   Communication: No difficulties  Cognition Arousal/Alertness: Awake/alert Behavior During Therapy: WFL for tasks assessed/performed Overall Cognitive Status: Within Functional Limits for tasks assessed  General Comments      Exercises     Assessment/Plan    PT Assessment Patent does not need any further PT services  PT Problem List         PT Treatment Interventions      PT Goals (Current goals can be found in the Care Plan section)  Acute Rehab PT Goals PT Goal Formulation: All assessment and education complete, DC therapy    Frequency     Barriers to discharge        Co-evaluation                AM-PAC PT "6 Clicks" Daily Activity  Outcome Measure Difficulty turning over in bed (including adjusting bedclothes, sheets and blankets)?: None Difficulty moving from lying on back to sitting on the side of the bed? : None Difficulty sitting down on and standing up from a chair with arms (e.g., wheelchair, bedside commode, etc,.)?: None Help needed moving to and from a bed to chair (including a wheelchair)?: A Little Help needed walking in hospital room?: A Little Help needed climbing 3-5 steps with a railing? : A Little 6 Click Score: 21    End of Session Equipment Utilized During Treatment: Gait belt Activity Tolerance: Patient tolerated treatment well Patient left: in chair;with call bell/phone within reach Nurse Communication: Mobility status PT Visit Diagnosis: Difficulty in walking, not elsewhere classified (R26.2)    Time: [id number] PT Time Calculation (min) (ACUTE ONLY): 18 min   Charges:   PT Evaluation $PT Eval Low Complexity: 1 Low     PT G CodesZenovia Clarke:       Danielle Clarke, PT, DPT Clarke Pager: [phone]  Maida SaleLEMYRE,Danielle Clarke, 1:07 PM

## 2017-08-08 NOTE — Discharge Summary (Signed)
Physician Discharge Summary  Danielle Clarke WUJ:[id number] DOB: 09-17-1941 DOA: 08/05/2017  PCP: Juluis Rainier, MD  Admit date: 08/05/2017 Discharge date: 08/08/2017  Admitted From: Home  Disposition: Home   Recommendations for Outpatient Follow-up:  1. Follow up with PCP in 1-2 weeks 2. Please obtain BMP/CBC in one week 3.   Home Health: no  Discharge Condition:Stable.  CODE STATUS: Full code.  Diet recommendation: Heart Healthy   Brief/Interim Summary: 76 year old Caucasian female with a past medical history of type 1 diabetes, history of asthmatic bronchitis which appears to be intermittent, history of hypertension who presented to the hospital with complaints of worsening shortness of breath. She is a non-smoker. The symptoms started 2-3 days prior to admission. She's had a nonproductive cough. She apparently was found to be hypoxic at her primary care physician's office. Does not use home oxygen. She was evaluated in the ED and then subsequently hospitalized.  Dyspnea/Acute bronchitis/elevated troponin Symptoms initially thought to be secondary to acute bronchitis.  Symptoms also seemed out of proportion to exam. So, she underwent CT scan of her chest which did not show any PE. Troponin was noted to be mildly elevated. She had nonspecific changes on her EKG on admission which resolved the following day. Cardiology was consulted. Appreciate their assistance. Patient was started on aspirin, IV heparin. Plan is for echocardiogram.  Patient was also started on nebulizer treatments. She reports a significant allergy to prednisone. Patient does have mild aortic stenosis based on echocardiogram from February.  Patient is feeling better. Cough persist. She will be discharge on azithromycin and nebulizer.  ECHO with normal EF. Cardiology recommends outpatient follow up./    History of bilateral carotid artery disease. Followed by cardiology. Continue aspirin, statin.  History of  diabetes mellitus type 1. CBGs are better controlled today. Continue Lantus. HbA1c was 7.9 in February.   Normocytic anemia. No evidence for overt bleeding.  Hb stable.   Known history of pulmonary hypertension. This is also monitored by her cardiologist.  Discharge Diagnoses:  Active Problems:   Bronchitis   Elevated troponin   Abnormal EKG    Discharge Instructions  Discharge Instructions    Diet - low sodium heart healthy    Complete by:  As directed    Increase activity slowly    Complete by:  As directed      Allergies as of 08/08/2017      Reactions   Cefaclor Anaphylaxis   Tetracycline Anaphylaxis   Vancomycin Anaphylaxis   Augmentin [amoxicillin-pot Clavulanate] Other (See Comments)   Reaction unknown   Prednisone Other (See Comments)   Reaction unknown      Medication List    STOP taking these medications   diclofenac 75 MG EC tablet Commonly known as:  VOLTAREN     TAKE these medications   acetaminophen 500 MG tablet Commonly known as:  TYLENOL Take 500 mg by mouth 3 (three) times daily.   albuterol 108 (90 Base) MCG/ACT inhaler Commonly known as:  PROVENTIL HFA;VENTOLIN HFA Inhale 2 puffs into the lungs every 6 (six) hours as needed for wheezing or shortness of breath.   aspirin EC 81 MG tablet Take 81 mg by mouth daily.   azithromycin 500 MG tablet Commonly known as:  ZITHROMAX Take one tablet daily   BASAGLAR KWIKPEN 100 UNIT/ML Sopn Inject 13 Units into the skin every morning.   cholecalciferol 1000 units tablet Commonly known as:  VITAMIN D Take 1,000 Units by mouth daily.   Chromium Picolinate 1000  MCG Tabs Take 1,000 mcg by mouth daily.   Cinnamon 500 MG Tabs Take 1 tablet by mouth 3 (three) times daily.   guaiFENesin 600 MG 12 hr tablet Commonly known as:  MUCINEX Take 1 tablet (600 mg total) by mouth 2 (two) times daily.   guaiFENesin 100 MG/5ML Soln Commonly known as:  ROBITUSSIN Take 5 mLs (100 mg total) by mouth  every 4 (four) hours as needed for cough or to loosen phlegm.   insulin aspart 100 UNIT/ML injection Commonly known as:  novoLOG Inject 2-6 Units into the skin See admin instructions. Sliding scale. Base= 2 units-Reading below 80 subtract 1; 80-150 take 2 units (base dose); 151-200 add 1 unit; 201-250 add 2 units; 251-300 add 3 units; 301-350 add 4 units; 351-400 add 5 units; 401-450 add 6 units   ipratropium 17 MCG/ACT inhaler Commonly known as:  ATROVENT HFA Inhale 1 puff into the lungs 3 (three) times daily.   loratadine 10 MG tablet Commonly known as:  CLARITIN Take 1 tablet (10 mg total) by mouth daily.   Magnesium 250 MG Tabs Take 250 mg by mouth 2 (two) times daily.   multivitamin with minerals Tabs tablet Take 1 tablet by mouth daily.   saccharomyces boulardii 250 MG capsule Commonly known as:  FLORASTOR Take 1 capsule (250 mg total) by mouth 2 (two) times daily.   simvastatin 40 MG tablet Commonly known as:  ZOCOR Take 40 mg by mouth every evening.   SYSTANE OP Place 1 drop into both eyes daily as needed (dry eyes).   traMADol 50 MG tablet Commonly known as:  ULTRAM Take 50 mg by mouth 2 (two) times daily.   Turmeric 500 MG Caps Take 500 mg by mouth daily.            Discharge Care Instructions        Start     Ordered   08/09/17 0000  azithromycin (ZITHROMAX) 500 MG tablet     08/08/17 1306   08/09/17 0000  loratadine (CLARITIN) 10 MG tablet  Daily     08/08/17 1306   08/08/17 0000  guaiFENesin (MUCINEX) 600 MG 12 hr tablet  2 times daily     08/08/17 1306   08/08/17 0000  guaiFENesin (ROBITUSSIN) 100 MG/5ML SOLN  Every 4 hours PRN     08/08/17 1306   08/08/17 0000  saccharomyces boulardii (FLORASTOR) 250 MG capsule  2 times daily     08/08/17 1306   08/08/17 0000  ipratropium (ATROVENT HFA) 17 MCG/ACT inhaler  3 times daily     08/08/17 1307   08/08/17 0000  albuterol (PROVENTIL HFA;VENTOLIN HFA) 108 (90 Base) MCG/ACT inhaler  Every 6 hours PRN      08/08/17 1307   08/08/17 0000  Increase activity slowly     08/08/17 1414   08/08/17 0000  Diet - low sodium heart healthy     08/08/17 1414      Allergies  Allergen Reactions  . Cefaclor Anaphylaxis  . Tetracycline Anaphylaxis  . Vancomycin Anaphylaxis  . Augmentin [Amoxicillin-Pot Clavulanate] Other (See Comments)    Reaction unknown  . Prednisone Other (See Comments)    Reaction unknown    Consultations:  Cardiology    Procedures/Studies: Dg Chest 2 View  Result Date: 08/05/2017 CLINICAL DATA:  Productive cough since yesterday. EXAM: CHEST  2 VIEW COMPARISON:  08/05/2017 FINDINGS: There is no focal parenchymal opacity. There is no pleural effusion or pneumothorax. The heart and mediastinal contours are  unremarkable. There is a right shoulder arthroplasty. There is an S-shaped scoliosis of the thoracolumbar spine. IMPRESSION: No active cardiopulmonary disease. Electronically Signed   By: Elige Ko   On: 08/05/2017 12:53   Ct Angio Chest Pe W Or Wo Contrast  Result Date: 08/06/2017 CLINICAL DATA:  Dyspnea.  Chronic.  Hypertension.  Never smoker. EXAM: CT ANGIOGRAPHY CHEST WITH CONTRAST TECHNIQUE: Multidetector CT imaging of the chest was performed using the standard protocol during bolus administration of intravenous contrast. Multiplanar CT image reconstructions and MIPs were obtained to evaluate the vascular anatomy. CONTRAST:  67 cc of Isovue-300 COMPARISON:  Plain films 07/28/2017 FINDINGS: Cardiovascular: The quality of this exam for evaluation of pulmonary embolism is moderate to good. The only limitation is mild motion. The bolus is well timed. No evidence of pulmonary embolism. Aortic and branch vessel atherosclerosis. Mild cardiomegaly. Multivessel coronary artery atherosclerosis. Mediastinum/Nodes: Beam hardening artifact from right shoulder arthroplasty and lower cervical spine fixation. Degraded evaluation of the upper chest and thoracic inlet. No mediastinal or hilar  adenopathy. Lungs/Pleura: No pleural fluid. Minimal motion degradation. Lower lobe predominant bronchial wall thickening is moderate. Upper Abdomen: Normal imaged portions of the liver, spleen, stomach, pancreas, adrenal glands, kidneys. Musculoskeletal: Right shoulder arthroplasty. Lower cervical spine fixation. Cervicothoracic spondylosis. Minimal superior endplate compression deformities at C2 and C4. Convex right thoracic spine curvature is moderate. Review of the MIP images confirms the above findings. IMPRESSION: 1. No evidence of pulmonary embolism. Mild limitations as detailed above. 2. Moderate lower lobe predominant bronchial wall thickening could represent chronic bronchitis. 3. Coronary artery atherosclerosis. Aortic Atherosclerosis (ICD10-I70.0). 4. Mild upper thoracic compression deformities, of indeterminate acuity. Electronically Signed   By: Jeronimo Greaves M.D.   On: 08/06/2017 15:22   Dg Chest Port 1 View  Result Date: 08/06/2017 CLINICAL DATA:  Dyspnea EXAM: PORTABLE CHEST 1 VIEW COMPARISON:  08/05/2017 FINDINGS: Normal heart size. Aortic atherosclerosis. There is no pleural effusion or edema. No airspace opacities identified. Previous right hip arthroplasty. IMPRESSION: No active disease. Electronically Signed   By: Signa Kell M.D.   On: 08/06/2017 10:56   (Echo, Carotid, EGD, Colonoscopy, ERCP)    Subjective:   Discharge Exam: Vitals:   08/07/17 2215 08/08/17 0607  BP: (!) 161/50 (!) 141/49  Pulse: 70 64  Resp: 18 16  Temp: 98.3 F (36.8 C)   SpO2: 99% 100%   Vitals:   08/07/17 0515 08/07/17 1510 08/07/17 2215 08/08/17 0607  BP: (!) 143/58 (!) 138/49 (!) 161/50 (!) 141/49  Pulse: 76 69 70 64  Resp: Temp: 97.6 F (36.4 C) 97.9 F (36.6 C) 98.3 F (36.8 C)   TempSrc: Oral Oral Oral Oral  SpO2: 100% 100% 99% 100%  Weight:      Height:        General: Pt is alert, awake, not in acute distress Cardiovascular: RRR, S1/S2 +, no rubs, no  gallops Respiratory: CTA bilaterally, no wheezing, no rhonchi Abdominal: Soft, NT, ND, bowel sounds + Extremities: no edema, no cyanosis    The results of significant diagnostics from this hospitalization (including imaging, microbiology, ancillary and laboratory) are listed below for reference.     Microbiology: No results found for this or any previous visit (from the past 240 hour(s)).   Labs: BNP (last 3 results)  Recent Labs  08/06/17 0441  BNP 302.4*   Basic Metabolic Panel:  Recent Labs Lab 08/05/17 1228 08/06/17 0441 08/08/17 0624  NA 135 138 142  K 5.3*  4.6 4.9  CL 97* 100* 103  CO2 27 26 32  GLUCOSE 396* 339* 48*  BUN CREATININE 0.95 0.91 0.70  CALCIUM 9.1 8.4* 8.7*   Liver Function Tests: No results for input(s): AST, ALT, ALKPHOS, BILITOT, PROT, ALBUMIN in the last 168 hours. No results for input(s): LIPASE, AMYLASE in the last 168 hours. No results for input(s): AMMONIA in the last 168 hours. CBC:  Recent Labs Lab 08/05/17 1228 08/06/17 0441 08/07/17 0255 08/08/17 0624  WBC 16.2* 9.0 5.9 5.2  NEUTROABS 15.2*  --   --   --   HGB 11.9* 10.1* 9.7* 10.8*  HCT 35.6* 30.9* 29.0* 33.0*  MCV 90.6 88.5 89.5 91.7  PLT 254 189 179 209   Cardiac Enzymes:  Recent Labs Lab 08/06/17 1345 08/06/17 2006 08/07/17 0255  TROPONINI 0.43* 0.34* 0.27*   BNP: Invalid input(s): POCBNP CBG:  Recent Labs Lab 08/07/17 1855 08/07/17 1931 08/07/17 2213 08/08/17 0818 08/08/17 1145  GLUCAP 61* 120* 301* 82 340*   D-Dimer No results for input(s): DDIMER in the last 72 hours. Hgb A1c No results for input(s): HGBA1C in the last 72 hours. Lipid Profile No results for input(s): CHOL, HDL, LDLCALC, TRIG, CHOLHDL, LDLDIRECT in the last 72 hours. Thyroid function studies No results for input(s): TSH, T4TOTAL, T3FREE, THYROIDAB in the last 72 hours.  Invalid input(s): FREET3 Anemia work up No results for input(s): VITAMINB12, FOLATE, FERRITIN,  TIBC, IRON, RETICCTPCT in the last 72 hours. Urinalysis    Component Value Date/Time   COLORURINE STRAW (A) 01/04/2017 2100   APPEARANCEUR CLEAR 01/04/2017 2100   LABSPEC 1.010 01/04/2017 2100   PHURINE 7.0 01/04/2017 2100   GLUCOSEU NEGATIVE 01/04/2017 2100   HGBUR NEGATIVE 01/04/2017 2100   BILIRUBINUR NEGATIVE 01/04/2017 2100   KETONESUR NEGATIVE 01/04/2017 2100   PROTEINUR NEGATIVE 01/04/2017 2100   UROBILINOGEN 0.2 10/07/2014 1808   NITRITE NEGATIVE 01/04/2017 2100   LEUKOCYTESUR NEGATIVE 01/04/2017 2100   Sepsis Labs Invalid input(s): PROCALCITONIN,  WBC,  LACTICIDVEN Microbiology No results found for this or any previous visit (from the past 240 hour(s)).   Time coordinating discharge: Over 30 minutes  SIGNED:   Alba Cory, MD  [facility] 08/08/2017, 2:18 PM Pager   If 7PM-7AM, please contact night-coverage [email or web address] Password [email or web address]

## 2017-08-24 ENCOUNTER — Encounter: Payer: Self-pay | Admitting: Cardiology

## 2017-09-04 ENCOUNTER — Ambulatory Visit: Payer: Medicare Other | Admitting: Cardiology

## 2017-09-26 ENCOUNTER — Ambulatory Visit (INDEPENDENT_AMBULATORY_CARE_PROVIDER_SITE_OTHER): Payer: Medicare Other | Admitting: Nurse Practitioner

## 2017-09-26 ENCOUNTER — Encounter: Payer: Self-pay | Admitting: Nurse Practitioner

## 2017-09-26 VITALS — BP 118/54 | HR 70 | Ht 62.0 in | Wt 112.0 lb

## 2017-09-26 DIAGNOSIS — I10 Essential (primary) hypertension: Secondary | ICD-10-CM

## 2017-09-26 DIAGNOSIS — R55 Syncope and collapse: Secondary | ICD-10-CM | POA: Diagnosis not present

## 2017-09-26 DIAGNOSIS — R778 Other specified abnormalities of plasma proteins: Secondary | ICD-10-CM

## 2017-09-26 DIAGNOSIS — I350 Nonrheumatic aortic (valve) stenosis: Secondary | ICD-10-CM | POA: Diagnosis not present

## 2017-09-26 DIAGNOSIS — R748 Abnormal levels of other serum enzymes: Secondary | ICD-10-CM

## 2017-09-26 DIAGNOSIS — R7989 Other specified abnormal findings of blood chemistry: Secondary | ICD-10-CM

## 2017-09-26 NOTE — Patient Instructions (Addendum)
We will be checking the following labs today - NONE   Medication Instructions:    Continue with your current medicines.     Testing/Procedures To Be Arranged:  Lexiscan Myoview  Follow-Up:   See Dr. [name] in March    Other Special Instructions:   N/A    If you need a refill on your cardiac medications before your next appointment, please call your pharmacy.   Call the [facility] office at [phone] if you have any questions, problems or concerns.

## 2017-09-26 NOTE — Progress Notes (Signed)
CARDIOLOGY OFFICE NOTE  Date:  09/26/2017    Azalee Course Date of Birth: 05-25-1941 Medical Record #[id number]  PCP:  Leighton Ruff, MD  Cardiologist:  Tamala Julian  Chief Complaint  Patient presents with  . Aortic Stenosis  . Hypertension    1 year check - Seen for Dr. Tamala Julian    History of Present Illness: Danielle Clarke is a 76 y.o. female who presents today for a one year check. Seen for Dr. Tamala Julian.   She has a hx ofaortic stenosis, recurring episodes of syncope, bilateral carotid disease, and essential hypertension.  Recent carotid dopplers stable 40-59% bilateral ICA stenosis.  Coincidentally, a thyroid nodule is identified-she was seen by PCP and thyroid ultrasound was done it was felt to be benign nodule and no further testing needed.    Last seen a year ago by Cecilie Kicks, NP - felt to be doing ok.    Admitted last month with bronchitis - seen by Dr. Radford Pax - had an elevated troponin level. She denied having chest pain - just cough/SOB/URI. EKG chronically abnormal.   Comes in today. Here alone. She feels like she is doing better. Has really resumed all of her normal activities. No chest pain. Tries to stay busy. Her cough has improved. Has had DM over 60 years. Sugars sound labile. No syncope. She tries to stay active. [employer] volunteering.   Past Medical History:  Diagnosis Date  . Arthritis   . Asthmatic bronchitis    with colds per patient  . Cataract   . Glaucoma   . Hyperlipemia   . Hypertension    Dr. Tamala Julian ~ 2 years ago  . Neck fracture [facility])    july 2013  . Osteoporosis   . Type 1 diabetes mellitus ([place])     Past Surgical History:  Procedure Laterality Date  . ANKLE FRACTURE SURGERY Left 2001   steel plate and 3 screws   . ANTERIOR CERVICAL CORPECTOMY N/A 07/31/2014   Procedure: Cervical Four to Cervical Six Corpectomy;  Surgeon: Floyce Stakes, MD;  Location: [facility];  Service: Neurosurgery;  Laterality: N/A;  C4 to C6 Corpectomy   . BREAST SURGERY  1988  . CATARACT EXTRACTION W/ INTRAOCULAR LENS  IMPLANT, BILATERAL Bilateral 2011   right and then left  . EYE SURGERY    . FRACTURE SURGERY    . [place]  . JOINT REPLACEMENT    . TONSILLECTOMY AND ADENOIDECTOMY  1948  . TOTAL SHOULDER REPLACEMENT  2010   right shoulder   . TUBAL LIGATION  1979  . TYMPANOSTOMY TUBE PLACEMENT Bilateral      Medications: Current Meds  Medication Sig  . acetaminophen (TYLENOL) 500 MG tablet Take 500 mg by mouth 3 (three) times daily.   Marland Kitchen albuterol (PROVENTIL HFA;VENTOLIN HFA) 108 (90 Base) MCG/ACT inhaler Inhale 2 puffs into the lungs every 6 (six) hours as needed for wheezing or shortness of breath.  Marland Kitchen aspirin EC 81 MG tablet Take 81 mg by mouth daily.  . cholecalciferol (VITAMIN D) 1000 UNITS tablet Take 1,000 Units by mouth daily.   . Chromium Picolinate 1000 MCG TABS Take 1,000 mcg by mouth daily.   . Cinnamon 500 MG TABS Take 1 tablet by mouth 3 (three) times daily.  . diclofenac (VOLTAREN) 75 MG EC tablet Take 75 mg by mouth 2 (two) times daily.  . Glucosamine-Chondroit-Vit C-Mn (GLUCOSAMINE CHONDR 1500 COMPLX) CAPS Take 1 capsule by mouth daily.  Marland Kitchen  guaiFENesin (MUCINEX) 600 MG 12 hr tablet Take 1 tablet (600 mg total) by mouth 2 (two) times daily.  . insulin aspart (NOVOLOG) 100 UNIT/ML injection Inject 2-6 Units into the skin See admin instructions. Sliding scale. Base= 2 units-Reading below 80 subtract 1; 80-150 take 2 units (base dose); 151-200 add 1 unit; 201-250 add 2 units; 251-300 add 3 units; 301-350 add 4 units; 351-400 add 5 units; 401-450 add 6 units  . Insulin Glargine (BASAGLAR KWIKPEN) 100 UNIT/ML SOPN Inject 13 Units into the skin every morning.   Marland Kitchen ipratropium (ATROVENT HFA) 17 MCG/ACT inhaler Inhale 1 puff into the lungs 3 (three) times daily.  . Magnesium 250 MG TABS Take 250 mg by mouth 2 (two) times daily.   . Multiple Vitamin (MULTIVITAMIN WITH MINERALS) TABS tablet Take 1 tablet by mouth  daily.  Vladimir Faster Glycol-Propyl Glycol (SYSTANE OP) Place 1 drop into both eyes daily as needed (dry eyes).  . simvastatin (ZOCOR) 40 MG tablet Take 40 mg by mouth every evening.  . traMADol (ULTRAM) 50 MG tablet Take 50 mg by mouth 2 (two) times daily.   . Turmeric 500 MG CAPS Take 500 mg by mouth daily.  . [DISCONTINUED] azithromycin (ZITHROMAX) 500 MG tablet Take one tablet daily  . [DISCONTINUED] guaiFENesin (ROBITUSSIN) 100 MG/5ML SOLN Take 5 mLs (100 mg total) by mouth every 4 (four) hours as needed for cough or to loosen phlegm.  . [DISCONTINUED] loratadine (CLARITIN) 10 MG tablet Take 1 tablet (10 mg total) by mouth daily.  . [DISCONTINUED] saccharomyces boulardii (FLORASTOR) 250 MG capsule Take 1 capsule (250 mg total) by mouth 2 (two) times daily.     Allergies: Allergies  Allergen Reactions  . Cefaclor Anaphylaxis  . Tetracycline Anaphylaxis  . Vancomycin Anaphylaxis  . Augmentin [Amoxicillin-Pot Clavulanate] Other (See Comments)    Reaction unknown  . Prednisone Other (See Comments)    Reaction unknown    Social History: The patient  reports that she has never smoked. She has never used smokeless tobacco. She reports that she drinks about 4.2 oz of alcohol per week . She reports that she uses drugs, including Mescaline.   Family History: The patient's family history includes Cancer in her mother; Heart Problems in her father.   Review of Systems: Please see the history of present illness.   Otherwise, the review of systems is positive for none.   All other systems are reviewed and negative.   Physical Exam: VS:  BP (!) 118/54   Pulse 70   Ht _0  (1.575 m)   Wt 112 lb (50.8 kg)   SpO2 90%   BMI 20.49 kg/m  .  BMI Body mass index is 20.49 kg/m.  Wt Readings from Last 3 Encounters:  09/26/17 112 lb (50.8 kg)  08/05/17 114 lb 2 oz (51.8 kg)  01/07/17 116 lb (52.6 kg)    General: Pleasant. Elderly female. Alert and in no acute distress. Thin.   HEENT:  Normal.  Neck: Supple, no JVD, carotid bruits, or masses noted.  Cardiac: Regular rate and rhythm. Harsh outflow murmur. No edema.  Respiratory:  Lungs are clear to auscultation bilaterally with normal work of breathing.  GI: Soft and nontender.  MS: No deformity or atrophy. Gait and ROM intact.  Skin: Warm and dry. Color is normal.  Neuro:  Strength and sensation are intact and no gross focal deficits noted.  Psych: Alert, appropriate and with normal affect.   LABORATORY DATA:  EKG:  EKG is not  ordered today.   Lab Results  Component Value Date   WBC 5.2 08/08/2017   HGB 10.8 (L) 08/08/2017   HCT 33.0 (L) 08/08/2017   PLT 209 08/08/2017   GLUCOSE 48 (L) 08/08/2017   ALT 16 01/05/2017   AST 18 01/05/2017   NA 142 08/08/2017   K 4.9 08/08/2017   CL 103 08/08/2017   CREATININE 0.70 08/08/2017   BUN 13 08/08/2017   CO2 32 08/08/2017   TSH 1.435 01/04/2017   HGBA1C 7.9 (H) 01/04/2017     BNP (last 3 results)  Recent Labs  08/06/17 0441  BNP 302.4*    ProBNP (last 3 results) No results for input(s): PROBNP in the last 8760 hours.   Other Studies Reviewed Today:  Echo Study Conclusions 07/2017  - Left ventricle: The cavity size was normal. Wall thickness was   normal. Systolic function was normal. The estimated ejection   fraction was in the range of 55% to 60%. Wall motion was normal;   there were no regional wall motion abnormalities. Features are   consistent with a pseudonormal left ventricular filling pattern,   with concomitant abnormal relaxation and increased filling   pressure (grade 2 diastolic dysfunction). - Aortic valve: Valve mobility was restricted. There was mild   stenosis. - Mitral valve: Severely calcified annulus. Mildly thickened   leaflets . There was moderate regurgitation. - Left atrium: The atrium was mildly dilated. - Tricuspid valve: There was moderate regurgitation. - Pulmonary arteries: Systolic pressure was mildly increased. PA    peak pressure: 39 mm Hg (S).  Impressions:  - Normal LV systolic function; moderate diastolic dysfunction;   calcified aortic valve with mild AS (mean gradient 15 mmHg);   severe MAC with moderate MR; mild LAE; moderate TR with mildly   elevated pulmonary pressure.  Carotid Dopplers 01/05/2017 Summary:  - Findings consistent with 40 - 59 percent stenosis involving the right internal carotid artery by peak systolic velocity. - Findings consistent with 1-39 percent stenosis in the left internal carotid artery.     ASSESSMENT AND PLAN:  1.  Elevated Troponin with abnormal EKG: Troponin 0.43.  EKG showed ST with LVH by voltage and repolarization abnormalities and widened QRS. There is more prominent ST depression on initial EKG but HR was faster and this appears to have improved after HR slowed.  She has not experienced any chest pain and prior to URI sx had no DOE or SOB.  Trop elevation felt to be most likely related to demand ischemia in the setting of URI.  She continues to do well. In light of multiple CV risk factors - chronically abnormal EKG - would arrange for [facility]. Continue with her current regimen otherwise.   2.  Shortness of breath with hypoxia: resolved  3.  Bilateral carotid artery disease: repeat dopplers done February of this year. No significant obstructive dz. -- cont ASA and statin  4.  DM: Most recent A1Cnoted.   5.  Hypertension: BP controlled - no changes made  6. Known AS - would follow - no symptoms.   Current medicines are reviewed with the patient today.  The patient does not have concerns regarding medicines other than what has been noted above.  The following changes have been made:  See above.  Labs/ tests ordered today include:    Orders Placed This Encounter  Procedures  . MYOCARDIAL PERFUSION IMAGING     Disposition:   FU with Dr. Tamala Julian in March - I will be  happy to see back as needed as well.    Patient is  agreeable to this plan and will call if any problems develop in the interim.   SignedTruitt Merle, NP  09/26/2017 11:38 AM  [place] [address] [place], [place]  [address] Phone: [phone] Fax: [phone]

## 2017-10-12 ENCOUNTER — Encounter (HOSPITAL_COMMUNITY): Payer: Medicare Other

## 2018-01-29 NOTE — Progress Notes (Signed)
Cardiology Office Note    Date:  01/30/2018   ID:  Danielle Clarke, DOB 07/05/1941, MRN [id number]  PCP:  Leighton Ruff, MD  Cardiologist: Sinclair Grooms, MD   Chief Complaint  Patient presents with  . Cardiac Valve Problem    History of Present Illness:  Danielle Clarke is a 77 y.o. female with a hx of aortic stenosis, recurring episodes of syncope, bilateral carotid disease, and essential hypertension.   Breathing better.  She has not had syncope.  Denies chest pain.  No lower extremity swelling.  We discussed the results of her echo.  No significant palpitations and no episodes of syncope.  Denies orthopnea.   Past Medical History:  Diagnosis Date  . Arthritis   . Asthmatic bronchitis    with colds per patient  . Cataract   . Glaucoma   . Hyperlipemia   . Hypertension    Dr. Tamala Julian ~ 2 years ago  . Neck fracture [facility])    july 2013  . Osteoporosis   . Type 1 diabetes mellitus ([place])     Past Surgical History:  Procedure Laterality Date  . ANKLE FRACTURE SURGERY Left 2001   steel plate and 3 screws   . ANTERIOR CERVICAL CORPECTOMY N/A 07/31/2014   Procedure: Cervical Four to Cervical Six Corpectomy;  Surgeon: Floyce Stakes, MD;  Location: [facility];  Service: Neurosurgery;  Laterality: N/A;  C4 to C6 Corpectomy  . BREAST SURGERY  1988  . CATARACT EXTRACTION W/ INTRAOCULAR LENS  IMPLANT, BILATERAL Bilateral 2011   right and then left  . EYE SURGERY    . FRACTURE SURGERY    . [place]  . JOINT REPLACEMENT    . TONSILLECTOMY AND ADENOIDECTOMY  1948  . TOTAL SHOULDER REPLACEMENT  2010   right shoulder   . TUBAL LIGATION  1979  . TYMPANOSTOMY TUBE PLACEMENT Bilateral     Current Medications: Outpatient Medications Prior to Visit  Medication Sig Dispense Refill  . acetaminophen (TYLENOL) 500 MG tablet Take 500 mg by mouth 3 (three) times daily.     Marland Kitchen albuterol (PROVENTIL HFA;VENTOLIN HFA) 108 (90 Base) MCG/ACT inhaler Inhale 2  puffs into the lungs every 6 (six) hours as needed for wheezing or shortness of breath. 1 Inhaler 2  . aspirin EC 81 MG tablet Take 81 mg by mouth daily.    . cholecalciferol (VITAMIN D) 1000 UNITS tablet Take 1,000 Units by mouth daily.     . Chromium Picolinate 1000 MCG TABS Take 1,000 mcg by mouth daily.     . Cinnamon 500 MG TABS Take 1 tablet by mouth 3 (three) times daily.    . diclofenac (VOLTAREN) 75 MG EC tablet Take 75 mg by mouth 2 (two) times daily.    . Glucosamine-Chondroit-Vit C-Mn (GLUCOSAMINE CHONDR 1500 COMPLX) CAPS Take 1 capsule by mouth daily.    Marland Kitchen guaiFENesin (MUCINEX) 600 MG 12 hr tablet Take 600 mg by mouth 2 (two) times daily as needed (congestion).    . insulin aspart (NOVOLOG) 100 UNIT/ML injection Inject 2-6 Units into the skin See admin instructions. Sliding scale. Base= 2 units-Reading below 80 subtract 1; 80-150 take 2 units (base dose); 151-200 add 1 unit; 201-250 add 2 units; 251-300 add 3 units; 301-350 add 4 units; 351-400 add 5 units; 401-450 add 6 units    . Insulin Glargine (BASAGLAR KWIKPEN) 100 UNIT/ML SOPN Inject 12 Units into the skin every morning.     Marland Kitchen  Magnesium 250 MG TABS Take 250 mg by mouth 2 (two) times daily.     . Multiple Vitamin (MULTIVITAMIN WITH MINERALS) TABS tablet Take 1 tablet by mouth daily.    Vladimir Faster Glycol-Propyl Glycol (SYSTANE OP) Place 1 drop into both eyes daily as needed (dry eyes).    . simvastatin (ZOCOR) 40 MG tablet Take 40 mg by mouth every evening.    . traMADol (ULTRAM) 50 MG tablet Take 50 mg by mouth 2 (two) times daily.     . Turmeric 500 MG CAPS Take 500 mg by mouth daily.    Marland Kitchen guaiFENesin (MUCINEX) 600 MG 12 hr tablet Take 1 tablet (600 mg total) by mouth 2 (two) times daily. (Patient not taking: Reported on 01/30/2018) 30 tablet 0  . ipratropium (ATROVENT HFA) 17 MCG/ACT inhaler Inhale 1 puff into the lungs 3 (three) times daily. (Patient not taking: Reported on 01/30/2018) 1 Inhaler 2   No facility-administered  medications prior to visit.      Allergies:   Cefaclor; Tetracycline; Vancomycin; Augmentin [amoxicillin-pot clavulanate]; and Prednisone   Social History   Socioeconomic History  . Marital status: Divorced    Spouse name: None  . Number of children: 2  . Years of education: [employer]  . Highest education level: None  Social Needs  . Financial resource strain: None  . Food insecurity - worry: None  . Food insecurity - inability: None  . Transportation needs - medical: None  . Transportation needs - non-medical: None  Occupational History  . Occupation: Retired    Fish farm manager: RETIRED  Tobacco Use  . Smoking status: Never Smoker  . Smokeless tobacco: Never Used  Substance and Sexual Activity  . Alcohol use: Yes    Alcohol/week: 4.2 oz    Types: 7 Glasses of wine per week  . Drug use: Yes    Types: Mescaline  . Sexual activity: No  Other Topics Concern  . None  Social History Narrative   Patient lives at home alone.   Caffeine Use:      Family History:  The patient's family history includes Cancer in her mother; Heart Problems in her father.   ROS:   Please see the history of present illness.    No complaints.  Having difficulty obtaining insulin.  Type I diabetic. All other systems reviewed and are negative.   PHYSICAL EXAM:   VS:  BP (!) 146/62   Pulse 80   Ht 5' (1.524 m)   Wt 113 lb 3.2 oz (51.3 kg)   BMI 22.11 kg/m    GEN: Well nourished, well developed, in no acute distress  HEENT: normal  Neck: no JVD, carotid bruits, or masses Cardiac: 2/6 crescendo decrescendo systolic murmur associated with the aortic valve lesion.  RRR; no rubs, or gallops,no edema  Respiratory:  clear to auscultation bilaterally, normal work of breathing GI: soft, nontender, nondistended, + BS MS: no deformity or atrophy  Skin: warm and dry, no rash Neuro:  Alert and Oriented x 3, Strength and sensation are intact Psych: euthymic mood, full affect  Wt Readings from Last 3  Encounters:  01/30/18 113 lb 3.2 oz (51.3 kg)  09/26/17 112 lb (50.8 kg)  08/05/17 114 lb 2 oz (51.8 kg)      Studies/Labs Reviewed:   EKG:  EKG none.  Tracing from September 2018 reveals prominent voltage, QS pattern V1 through V3, normal sinus rhythm.  Recent Labs: 08/06/2017: B Natriuretic Peptide 302.4 08/08/2017: BUN 13; Creatinine, Ser 0.70;  Hemoglobin 10.8; Platelets 209; Potassium 4.9; Sodium 142   Lipid Panel No results found for: CHOL, TRIG, HDL, CHOLHDL, VLDL, LDLCALC, LDLDIRECT  Additional studies/ records that were reviewed today include:  Echocardiography performed 08/07/17: Study Conclusions   - Left ventricle: The cavity size was normal. Wall thickness was   normal. Systolic function was normal. The estimated ejection   fraction was in the range of 55% to 60%. Wall motion was normal;   there were no regional wall motion abnormalities. Features are   consistent with a pseudonormal left ventricular filling pattern,   with concomitant abnormal relaxation and increased filling   pressure (grade 2 diastolic dysfunction). - Aortic valve: Valve mobility was restricted. There was mild   stenosis. - Mitral valve: Severely calcified annulus. Mildly thickened   leaflets . There was moderate regurgitation. - Left atrium: The atrium was mildly dilated. - Tricuspid valve: There was moderate regurgitation. - Pulmonary arteries: Systolic pressure was mildly increased. PA   peak pressure: 39 mm Hg (S).   Impressions:   - Normal LV systolic function; moderate diastolic dysfunction;   calcified aortic valve with mild AS (mean gradient 15 mmHg);   severe MAC with moderate MR; mild LAE; moderate TR with mildly   elevated pulmonary pressure.    ASSESSMENT:    1. Nonrheumatic aortic valve stenosis   2. Essential hypertension, benign   3. Bilateral carotid artery stenosis   4. Mixed hyperlipidemia      PLAN:  In order of problems listed above:  1. Based upon the most  recent echo, will continue to follow the aortic valve.  Murmur sounds more impressive than the hemodynamic data from echo. 2. Blood pressure control is adequate.  I would like for the systolic pressure to be less than or equal to 130 mmHg.  No changes are made today. 3. Needs continued surveillance. 4. LDL target is less than 70.  Clinical follow-up in 1 year.  Consider repeat echocardiogram in 1-2 years.    Medication Adjustments/Labs and Tests Ordered: Current medicines are reviewed at length with the patient today.  Concerns regarding medicines are outlined above.  Medication changes, Labs and Tests ordered today are listed in the Patient Instructions below. Patient Instructions  Medication Instructions:  Your physician recommends that you continue on your current medications as directed. Please refer to the Current Medication list given to you today.  Labwork: None  Testing/Procedures: None  Follow-Up: Your physician wants you to follow-up in: 1 year with Dr. Tamala Julian.  You will receive a reminder letter in the mail two months in advance. If you don't receive a letter, please call our office to schedule the follow-up appointment.   Any Other Special Instructions Will Be Listed Below (If Applicable).     If you need a refill on your cardiac medications before your next appointment, please call your pharmacy.      Signed, Sinclair Grooms, MD  01/30/2018 11:01 AM    [address], [place], [place]  [address] Phone: [phone]; Fax: [phone]

## 2018-01-30 ENCOUNTER — Ambulatory Visit: Payer: Medicare Other | Admitting: Interventional Cardiology

## 2018-01-30 ENCOUNTER — Encounter: Payer: Self-pay | Admitting: Interventional Cardiology

## 2018-01-30 VITALS — BP 146/62 | HR 80 | Ht 60.0 in | Wt 113.2 lb

## 2018-01-30 DIAGNOSIS — I6523 Occlusion and stenosis of bilateral carotid arteries: Secondary | ICD-10-CM | POA: Diagnosis not present

## 2018-01-30 DIAGNOSIS — E782 Mixed hyperlipidemia: Secondary | ICD-10-CM

## 2018-01-30 DIAGNOSIS — I350 Nonrheumatic aortic (valve) stenosis: Secondary | ICD-10-CM | POA: Diagnosis not present

## 2018-01-30 DIAGNOSIS — I10 Essential (primary) hypertension: Secondary | ICD-10-CM | POA: Diagnosis not present

## 2018-01-30 NOTE — Patient Instructions (Signed)

## 2018-05-27 ENCOUNTER — Encounter (INDEPENDENT_AMBULATORY_CARE_PROVIDER_SITE_OTHER): Payer: Self-pay | Admitting: Orthopedic Surgery

## 2018-05-27 ENCOUNTER — Ambulatory Visit (INDEPENDENT_AMBULATORY_CARE_PROVIDER_SITE_OTHER): Payer: Medicare Other | Admitting: Orthopedic Surgery

## 2018-05-27 VITALS — Ht 60.0 in | Wt 113.0 lb

## 2018-05-27 DIAGNOSIS — M205X1 Other deformities of toe(s) (acquired), right foot: Secondary | ICD-10-CM

## 2018-05-27 NOTE — Progress Notes (Signed)
Office Visit Note   Patient: Danielle Clarke           Date of Birth: 1941/10/28           MRN: [id number] Visit Date: 05/27/2018              Requested by: Juluis RainierBarnes, [name], MD [address] [place], [place] [address] PCP: Juluis RainierBarnes, [name], MD  Chief Complaint  Patient presents with  . Right Foot - Pain      HPI: Patient is a 77 year old woman with type I diabetic who has been quite active and athletic.  She has had a PIP resection of the left foot second toe and she has been having chronic pain ulceration redness of the right foot second toe due to the fixed claw deformity.  Patient states she has failed conservative treatment she is used silo pads without relief and wishes to pursue surgical.  Assessment & Plan: Visit Diagnoses:  1. Acquired claw toe of right foot     Plan: Patient states she would like to proceed with minimal surgical intervention.  To resolve the clawing of the second toe.  Discussed that we could proceed with a Weil osteotomy for the second metatarsal and PIP resection with pinning of the second toe with tendon transfer.  Discussed that with her diabetes she is at increased risk of infection wound healing complications.  Patient states she understands wished to proceed at this time we will set this up as outpatient surgery.  Follow-Up Instructions: Return in about 2 weeks (around 06/10/2018).   Ortho Exam  Patient is alert, oriented, no adenopathy, well-dressed, normal affect, normal respiratory effort. On examination patient has a good dorsalis pedis pulse.  She has redness of the right second toe but there is no sausage digit swelling she does have an ulcer over the PIP joint but there is no exposed bone or tendon.  Review of her radiographs shows calcification of the dorsalis pedis artery she has a long second metatarsal of the joints are congruent.  Her hemoglobin A1c is 7.9.  Imaging: No results found. No images are attached to the  encounter.  Labs: Lab Results  Component Value Date   HGBA1C 7.9 (H) 01/04/2017   REPTSTATUS 03/28/2008 FINAL 03/26/2008   CULT  03/26/2008    Multiple bacterial morphotypes present, none predominant. Suggest appropriate recollection if clinically indicated.     Lab Results  Component Value Date   ALBUMIN 3.4 (L) 01/05/2017   ALBUMIN 3.7 01/04/2017   ALBUMIN 3.9 10/07/2014    Body mass index is 22.07 kg/m.  Orders:  No orders of the defined types were placed in this encounter.  No orders of the defined types were placed in this encounter.    Procedures: No procedures performed  Clinical Data: No additional findings.  ROS:  All other systems negative, except as noted in the HPI. Review of Systems  Objective: Vital Signs: Ht 5' (1.524 m)   Wt 113 lb (51.3 kg)   BMI 22.07 kg/m   Specialty Comments:  No specialty comments available.  PMFS History: Patient Active Problem List   Diagnosis Date Noted  . Elevated troponin   . Abnormal EKG   . Bronchitis 08/05/2017  . Bilateral carotid artery stenosis   . Aortic stenosis 08/25/2015  . Cervical stenosis of spinal canal 07/31/2014  . CKD (chronic kidney disease) stage 2, GFR 60-89 ml/min 12/30/2013  . Essential hypertension, benign 12/30/2013  . Hyperlipidemia 12/30/2013  . Toe infection 12/30/2013  .  Anemia 12/30/2013  . Acute renal failure (H[facility]) 12/29/2013  . Syncope 01/02/2013  . Diabetes mellitus type 1 (HCC) 01/02/2013  . Bilateral carotid artery disease (HCC) 01/02/2013  . GOITER, MULTINODULAR 02/11/2008   Past Medical History:  Diagnosis Date  . Arthritis   . Asthmatic bronchitis    with colds per patient  . Cataract   . Glaucoma   . Hyperlipemia   . Hypertension    Dr. Katrinka Blazing ~ 2 years ago  . Neck fracture [facility])    july 2013  . Osteoporosis   . Type 1 diabetes mellitus (HCC)     Family History  Problem Relation Age of Onset  . Cancer Mother   . Heart Problems Father     Past Surgical  History:  Procedure Laterality Date  . ANKLE FRACTURE SURGERY Left 2001   steel plate and 3 screws   . ANTERIOR CERVICAL CORPECTOMY N/A 07/31/2014   Procedure: Cervical Four to Cervical Six Corpectomy;  Surgeon: Karn Cassis, MD;  Location: [facility];  Service: Neurosurgery;  Laterality: N/A;  C4 to C6 Corpectomy  . BREAST SURGERY  1988  . CATARACT EXTRACTION W/ INTRAOCULAR LENS  IMPLANT, BILATERAL Bilateral 2011   right and then left  . EYE SURGERY    . FRACTURE SURGERY    . HAMMER TOE SURGERY  1998  . JOINT REPLACEMENT    . TONSILLECTOMY AND ADENOIDECTOMY  1948  . TOTAL SHOULDER REPLACEMENT  2010   right shoulder   . TUBAL LIGATION  1979  . TYMPANOSTOMY TUBE PLACEMENT Bilateral    Social History   Occupational History  . Occupation: Retired    Associate Professor: RETIRED  Tobacco Use  . Smoking status: Never Smoker  . Smokeless tobacco: Never Used  Substance and Sexual Activity  . Alcohol use: Yes    Alcohol/week: 4.2 oz    Types: 7 Glasses of wine per week  . Drug use: Yes    Types: Mescaline  . Sexual activity: Never

## 2018-07-09 ENCOUNTER — Telehealth: Payer: Self-pay | Admitting: Interventional Cardiology

## 2018-07-09 NOTE — Telephone Encounter (Signed)
Walk In pt Form-[place] [employer] paper dropped off for completion. Placed in Dr.Smith doc box.

## 2018-07-16 ENCOUNTER — Encounter (INDEPENDENT_AMBULATORY_CARE_PROVIDER_SITE_OTHER): Payer: Self-pay | Admitting: Orthopedic Surgery

## 2018-07-16 ENCOUNTER — Ambulatory Visit (INDEPENDENT_AMBULATORY_CARE_PROVIDER_SITE_OTHER): Payer: Medicare Other | Admitting: Orthopedic Surgery

## 2018-07-16 ENCOUNTER — Telehealth: Payer: Self-pay | Admitting: Interventional Cardiology

## 2018-07-16 DIAGNOSIS — M205X1 Other deformities of toe(s) (acquired), right foot: Secondary | ICD-10-CM

## 2018-07-16 NOTE — Progress Notes (Signed)
Office Visit Note   Patient: Danielle Clarke           Date of Birth: [birth year]           MRN: [id number] Visit Date: 07/16/2018              Requested by: Juluis RainierBarnes, [name], MD [address] [place], [place] [address] PCP: Juluis RainierBarnes, [name], MD  Chief Complaint  Patient presents with  . Right Foot - Follow-up      HPI: Patient presents for questions regarding her claw toe surgery on the right.  Assessment & Plan: Visit Diagnoses:  1. Acquired claw toe of right foot     Plan: Discussed postoperative protocol recommended protein supplements elevation minimize weightbearing for the first 2 weeks.  Follow-Up Instructions: Return in about 2 weeks (around 07/30/2018).   Ortho Exam  Patient is alert, oriented, no adenopathy, well-dressed, normal affect, normal respiratory effort. Examination patient has palpable pulses there is no redness no cellulitis she does have a superficial ulcer over the PIP joint but there is no cellulitis no drainage no exposed bone or tendon no signs of infection.  Patient has been cleared medically.  Imaging: No results found. No images are attached to the encounter.  Labs: Lab Results  Component Value Date   HGBA1C 7.9 (H) 01/04/2017   REPTSTATUS 03/28/2008 FINAL 03/26/2008   CULT  03/26/2008    Multiple bacterial morphotypes present, none predominant. Suggest appropriate recollection if clinically indicated.     Lab Results  Component Value Date   ALBUMIN 3.4 (L) 01/05/2017   ALBUMIN 3.7 01/04/2017   ALBUMIN 3.9 10/07/2014    There is no height or weight on file to calculate BMI.  Orders:  No orders of the defined types were placed in this encounter.  No orders of the defined types were placed in this encounter.    Procedures: No procedures performed  Clinical Data: No additional findings.  ROS:  All other systems negative, except as noted in the HPI. Review of Systems  Objective: Vital Signs: There were no  vitals taken for this visit.  Specialty Comments:  No specialty comments available.  PMFS History: Patient Active Problem List   Diagnosis Date Noted  . Elevated troponin   . Abnormal EKG   . Bronchitis 08/05/2017  . Bilateral carotid artery stenosis   . Aortic stenosis 08/25/2015  . Cervical stenosis of spinal canal 07/31/2014  . CKD (chronic kidney disease) stage 2, GFR 60-89 ml/min 12/30/2013  . Essential hypertension, benign 12/30/2013  . Hyperlipidemia 12/30/2013  . Toe infection 12/30/2013  . Anemia 12/30/2013  . Acute renal failure (HCC) 12/29/2013  . Syncope 01/02/2013  . Diabetes mellitus type 1 (HCC) 01/02/2013  . Bilateral carotid artery disease (HCC) 01/02/2013  . GOITER, MULTINODULAR 02/11/2008   Past Medical History:  Diagnosis Date  . Arthritis   . Asthmatic bronchitis    with colds per patient  . Cataract   . Glaucoma   . Hyperlipemia   . Hypertension    Dr. [name] ~ 2 years ago  . Neck fracture [facility])    july 2013  . Osteoporosis   . Type 1 diabetes mellitus (HCC)     Family History  Problem Relation Age of Onset  . Cancer Mother   . Heart Problems Father     Past Surgical History:  Procedure Laterality Date  . ANKLE FRACTURE SURGERY Left 2001   steel plate and 3 screws   . ANTERIOR CERVICAL CORPECTOMY  N/A 07/31/2014   Procedure: Cervical Four to Cervical Six Corpectomy;  Surgeon: Karn CassisErnesto M Botero, MD;  Location: [facility];  Service: Neurosurgery;  Laterality: N/A;  C4 to C6 Corpectomy  . BREAST SURGERY  1988  . CATARACT EXTRACTION W/ INTRAOCULAR LENS  IMPLANT, BILATERAL Bilateral 2011   right and then left  . EYE SURGERY    . FRACTURE SURGERY    . HAMMER TOE SURGERY  1998  . JOINT REPLACEMENT    . TONSILLECTOMY AND ADENOIDECTOMY  1948  . TOTAL SHOULDER REPLACEMENT  2010   right shoulder   . TUBAL LIGATION  1979  . TYMPANOSTOMY TUBE PLACEMENT Bilateral    Social History   Occupational History  . Occupation: Retired    Associate Professormployer:  RETIRED  Tobacco Use  . Smoking status: Never Smoker  . Smokeless tobacco: Never Used  Substance and Sexual Activity  . Alcohol use: Yes    Alcohol/week: 7.0 standard drinks    Types: 7 Glasses of wine per week  . Drug use: Yes    Types: Mescaline  . Sexual activity: Never

## 2018-07-16 NOTE — Telephone Encounter (Signed)
Patient aware [place] [employer] paper completed and ready for pick up.

## 2018-08-12 ENCOUNTER — Telehealth: Payer: Self-pay | Admitting: Interventional Cardiology

## 2018-08-12 NOTE — Telephone Encounter (Signed)
Per Elnita Maxwellheryl at [facility], they will be using a Block, Anesthesologists choice.

## 2018-08-12 NOTE — Telephone Encounter (Signed)
I have no record of clearance, what type of surgery  Then will need to discuss with pt  Is this emergent?

## 2018-08-12 NOTE — Telephone Encounter (Signed)
   Primary Cardiologist: Lesleigh NoeHenry W Smith III, MD  Chart reviewed as part of pre-operative protocol coverage. Given past medical history syncope and mild AS and time since last visit, based on ACC/AHA guidelines, Saul FordyceJacqueline J Creps would be at acceptable risk for the planned procedure without further cardiovascular testing.  She did have syncope last week - due to hypoglycemia.  She was "out" for 3 hours, glucose 45.  No cardiac issues.   I will route this recommendation to the requesting party via Epic fax function and remove from pre-op pool.  Please call with questions.  Nada BoozerLaura Ingold, NP 08/12/2018, 3:02 PM

## 2018-08-12 NOTE — Telephone Encounter (Signed)
Called pt to let her know that she has been cleared from a cardiac standpoint. Pt thanked me for the call.

## 2018-08-12 NOTE — Telephone Encounter (Signed)
Returned the call to Dr. Audrie Liauda's office, spoke with Elnita Maxwellheryl. She mentioned that she had spoken with a receptionist / scheduler on Friday, 08/09/18, and they were supposed to give it to someone. I advised Elnita MaxwellCheryl to fax it to me personally and I will let Vernona RiegerLaura check into it.  She thanked me for the follow up.

## 2018-08-12 NOTE — Telephone Encounter (Signed)
   [facility] Pre-operative Risk Assessment    Request for surgical clearance:  1. What type of surgery is being performed? Weil Osteotomy   2. When is this surgery scheduled? 08/13/18   3. What type of clearance is required (medical clearance vs. Pharmacy clearance to hold med vs. Both)? Both  4. Are there any medications that need to be held prior to surgery and how long? Unspecified    5. Practice name and name of physician performing surgery? [facility]   6. What is your office phone number: [phone]    7.   What is your office fax number: [phone]  8.   Anesthesia type (None, local, MAC, general) ? Unspecific    Danielle Clarke 08/12/2018, 2:36 PM  _________________________________________________________________   (provider comments below)

## 2018-08-12 NOTE — Telephone Encounter (Signed)
Follow Up:     Danielle Clarke is checking on the status of the clearance that was sent on Friday. She needs this asap, surgery is scheduled for tomorrow. Please fax to [phone] UJW:[id number]:Cheryl.

## 2018-08-13 ENCOUNTER — Telehealth (INDEPENDENT_AMBULATORY_CARE_PROVIDER_SITE_OTHER): Payer: Self-pay

## 2018-08-13 ENCOUNTER — Encounter: Payer: Self-pay | Admitting: Orthopedic Surgery

## 2018-08-13 DIAGNOSIS — M2041 Other hammer toe(s) (acquired), right foot: Secondary | ICD-10-CM | POA: Diagnosis not present

## 2018-08-13 NOTE — Telephone Encounter (Signed)
Patients daughter called, had surgery today and noticed bleeding through gauze. Spoke with Dr Audrie Liauda's NP and she advised re-inforce and have come tomorrow a.m appt scheduled.

## 2018-08-14 ENCOUNTER — Ambulatory Visit (INDEPENDENT_AMBULATORY_CARE_PROVIDER_SITE_OTHER): Payer: Medicare Other | Admitting: Physician Assistant

## 2018-08-14 ENCOUNTER — Encounter (INDEPENDENT_AMBULATORY_CARE_PROVIDER_SITE_OTHER): Payer: Self-pay | Admitting: Physician Assistant

## 2018-08-14 DIAGNOSIS — M205X1 Other deformities of toe(s) (acquired), right foot: Secondary | ICD-10-CM

## 2018-08-14 NOTE — Progress Notes (Signed)
Office Visit Note   Patient: Danielle Clarke           Date of Birth: 1941-01-13           MRN: [id number] Visit Date: 08/14/2018              Requested by: Juluis Rainier, MD [address] [place], [place] [address] PCP: Juluis Rainier, MD  No chief complaint on file.     HPI: Patient is a 77 year old female who was brought in by her daughter today for recheck of her operative site.  She underwent repair of claw toe deformity of the right second toe yesterday morning.  They were concerned because she had had a fair amount of bleeding through her dressings.  They also said had some questions about how to give her pain medication.  She normally takes some Ultram for pain control but has also been taking half of the Norco postoperatively.  Counseled the patient and daughter that they should alternate the Ultram and the half a Norco tablet should be taken as needed for breakthrough pain.  She has been wearing her postoperative shoe and bearing weight only through the right heel when she is up walking.  Assessment & Plan: Visit Diagnoses:  1. Acquired claw toe of right foot     Plan: The right foot dressing was replaced with Adaptic over the incision and gauze padding which was then secured with Kerlix and an Ace wrap.  They will keep the extremity elevated as much as possible.  She will continue to walk in the postoperative shoe weightbearing through the right heel only.  The follow-up next week as previously scheduled.  Follow-Up Instructions: Return in about 1 week (around 08/21/2018).   Ortho Exam  Patient is alert, oriented, no adenopathy, well-dressed, normal affect, normal respiratory effort. The right foot dressing was taken down and she had moderate old bleeding from the operative site noted.  There is no active bleeding.  The incision is loosely closed with sutures but there again is no active ooze from the site.  There is no signs of cellulitis.  There is no edema.   She has good pedal pulses.    Imaging: No results found. No images are attached to the encounter.  Labs: Lab Results  Component Value Date   HGBA1C 7.9 (H) 01/04/2017   REPTSTATUS 03/28/2008 FINAL 03/26/2008   CULT  03/26/2008    Multiple bacterial morphotypes present, none predominant. Suggest appropriate recollection if clinically indicated.     Lab Results  Component Value Date   ALBUMIN 3.4 (L) 01/05/2017   ALBUMIN 3.7 01/04/2017   ALBUMIN 3.9 10/07/2014    There is no height or weight on file to calculate BMI.  Orders:  No orders of the defined types were placed in this encounter.  No orders of the defined types were placed in this encounter.    Procedures: No procedures performed  Clinical Data: No additional findings.  ROS:  All other systems negative, except as noted in the HPI. Review of Systems  Objective: Vital Signs: There were no vitals taken for this visit.  Specialty Comments:  No specialty comments available.  PMFS History: Patient Active Problem List   Diagnosis Date Noted  . Elevated troponin   . Abnormal EKG   . Bronchitis 08/05/2017  . Bilateral carotid artery stenosis   . Aortic stenosis 08/25/2015  . Cervical stenosis of spinal canal 07/31/2014  . CKD (chronic kidney disease) stage 2, GFR 60-89  ml/min 12/30/2013  . Essential hypertension, benign 12/30/2013  . Hyperlipidemia 12/30/2013  . Toe infection 12/30/2013  . Anemia 12/30/2013  . Acute renal failure (HCC) 12/29/2013  . Syncope 01/02/2013  . Diabetes mellitus type 1 (HCC) 01/02/2013  . Bilateral carotid artery disease (HCC) 01/02/2013  . GOITER, MULTINODULAR 02/11/2008   Past Medical History:  Diagnosis Date  . Arthritis   . Asthmatic bronchitis    with colds per patient  . Cataract   . Glaucoma   . Hyperlipemia   . Hypertension    Dr. [name] ~ 2 years ago  . Neck fracture [facility](HCC)    july 2013  . Osteoporosis   . Type 1 diabetes mellitus (HCC)     Family  History  Problem Relation Age of Onset  . Cancer Mother   . Heart Problems Father     Past Surgical History:  Procedure Laterality Date  . ANKLE FRACTURE SURGERY Left 2001   steel plate and 3 screws   . ANTERIOR CERVICAL CORPECTOMY N/A 07/31/2014   Procedure: Cervical Four to Cervical Six Corpectomy;  Surgeon: Karn CassisErnesto M Botero, MD;  Location: [facility];  Service: Neurosurgery;  Laterality: N/A;  C4 to C6 Corpectomy  . BREAST SURGERY  1988  . CATARACT EXTRACTION W/ INTRAOCULAR LENS  IMPLANT, BILATERAL Bilateral 2011   right and then left  . EYE SURGERY    . FRACTURE SURGERY    . HAMMER TOE SURGERY  1998  . JOINT REPLACEMENT    . TONSILLECTOMY AND ADENOIDECTOMY  1948  . TOTAL SHOULDER REPLACEMENT  2010   right shoulder   . TUBAL LIGATION  1979  . TYMPANOSTOMY TUBE PLACEMENT Bilateral    Social History   Occupational History  . Occupation: Retired    Associate Professormployer: RETIRED  Tobacco Use  . Smoking status: Never Smoker  . Smokeless tobacco: Never Used  Substance and Sexual Activity  . Alcohol use: Yes    Alcohol/week: 7.0 standard drinks    Types: 7 Glasses of wine per week  . Drug use: Yes    Types: Mescaline  . Sexual activity: Never

## 2018-08-20 ENCOUNTER — Encounter (INDEPENDENT_AMBULATORY_CARE_PROVIDER_SITE_OTHER): Payer: Self-pay | Admitting: Orthopedic Surgery

## 2018-08-20 ENCOUNTER — Ambulatory Visit (INDEPENDENT_AMBULATORY_CARE_PROVIDER_SITE_OTHER): Payer: Medicare Other | Admitting: Physician Assistant

## 2018-08-20 VITALS — Ht 60.0 in | Wt 113.0 lb

## 2018-08-20 DIAGNOSIS — M205X1 Other deformities of toe(s) (acquired), right foot: Secondary | ICD-10-CM

## 2018-08-21 ENCOUNTER — Encounter (INDEPENDENT_AMBULATORY_CARE_PROVIDER_SITE_OTHER): Payer: Self-pay | Admitting: Physician Assistant

## 2018-08-21 NOTE — Progress Notes (Signed)
Office Visit Note   Patient: Danielle Clarke           Date of Birth: 05/18/1941           MRN: [id number] Visit Date: 08/20/2018              Requested by: Juluis Rainier, MD [address] [place], [place] [address] PCP: Juluis Rainier, MD  Chief Complaint  Patient presents with  . Right Foot - Routine Post Op      HPI: Patient is a 77 year old female who is seen for postoperative follow-up following repair of right second toe claw toe deformity on 08/13/2018.  She has been doing well at home and trying to elevate her foot as much as possible.  She has been weightbearing as tolerated in a postop shoe with her walker.  She is avoiding weightbearing over the anterior foot.  She continues to have some swelling.  She is taking some hydrocodone for pain.  Assessment & Plan: Visit Diagnoses:  1. Acquired claw toe of right foot     Plan: We will plan to see the patient back in 1 week with harvest of sutures at that time.  We did to provide her with a hammertoe crest elastic band to wear at all times.  She is to elevate the left lower extremity as much as possible and continue with nonweightbearing ambulating with her walker.  Follow-Up Instructions: Return in about 1 week (around 08/27/2018).   Ortho Exam  Patient is alert, oriented, no adenopathy, well-dressed, normal affect, normal respiratory effort. The incision over the left toe and pin sites are healing.  Sutures remain in place.  She does have some mild local reaction to the sutures.  She has some minimal edema.  The toe is popping up some and we are going to place a elastic band to help with this and she was instructed in how to use this.  There are no signs of cellulitis.  Imaging: No results found. No images are attached to the encounter.  Labs: Lab Results  Component Value Date   HGBA1C 7.9 (H) 01/04/2017   REPTSTATUS 03/28/2008 FINAL 03/26/2008   CULT  03/26/2008    Multiple bacterial morphotypes present,  none predominant. Suggest appropriate recollection if clinically indicated.     Lab Results  Component Value Date   ALBUMIN 3.4 (L) 01/05/2017   ALBUMIN 3.7 01/04/2017   ALBUMIN 3.9 10/07/2014    Body mass index is 22.07 kg/m.  Orders:  No orders of the defined types were placed in this encounter.  No orders of the defined types were placed in this encounter.    Procedures: No procedures performed  Clinical Data: No additional findings.  ROS:  All other systems negative, except as noted in the HPI. Review of Systems  Objective: Vital Signs: Ht 5' (1.524 m)   Wt 113 lb (51.3 kg)   BMI 22.07 kg/m   Specialty Comments:  No specialty comments available.  PMFS History: Patient Active Problem List   Diagnosis Date Noted  . Elevated troponin   . Abnormal EKG   . Bronchitis 08/05/2017  . Bilateral carotid artery stenosis   . Aortic stenosis 08/25/2015  . Cervical stenosis of spinal canal 07/31/2014  . CKD (chronic kidney disease) stage 2, GFR 60-89 ml/min 12/30/2013  . Essential hypertension, benign 12/30/2013  . Hyperlipidemia 12/30/2013  . Toe infection 12/30/2013  . Anemia 12/30/2013  . Acute renal failure (HCC) 12/29/2013  . Syncope 01/02/2013  .  Diabetes mellitus type 1 (HCC) 01/02/2013  . Bilateral carotid artery disease (HCC) 01/02/2013  . GOITER, MULTINODULAR 02/11/2008   Past Medical History:  Diagnosis Date  . Arthritis   . Asthmatic bronchitis    with colds per patient  . Cataract   . Glaucoma   . Hyperlipemia   . Hypertension    Dr. Katrinka Blazing ~ 2 years ago  . Neck fracture [facility])    july 2013  . Osteoporosis   . Type 1 diabetes mellitus (HCC)     Family History  Problem Relation Age of Onset  . Cancer Mother   . Heart Problems Father     Past Surgical History:  Procedure Laterality Date  . ANKLE FRACTURE SURGERY Left 2001   steel plate and 3 screws   . ANTERIOR CERVICAL CORPECTOMY N/A 07/31/2014   Procedure: Cervical Four to Cervical  Six Corpectomy;  Surgeon: Karn Cassis, MD;  Location: [facility];  Service: Neurosurgery;  Laterality: N/A;  C4 to C6 Corpectomy  . BREAST SURGERY  1988  . CATARACT EXTRACTION W/ INTRAOCULAR LENS  IMPLANT, BILATERAL Bilateral 2011   right and then left  . EYE SURGERY    . FRACTURE SURGERY    . HAMMER TOE SURGERY  1998  . JOINT REPLACEMENT    . TONSILLECTOMY AND ADENOIDECTOMY  1948  . TOTAL SHOULDER REPLACEMENT  2010   right shoulder   . TUBAL LIGATION  1979  . TYMPANOSTOMY TUBE PLACEMENT Bilateral    Social History   Occupational History  . Occupation: Retired    Associate Professor: RETIRED  Tobacco Use  . Smoking status: Never Smoker  . Smokeless tobacco: Never Used  Substance and Sexual Activity  . Alcohol use: Yes    Alcohol/week: 7.0 standard drinks    Types: 7 Glasses of wine per week  . Drug use: Yes    Types: Mescaline  . Sexual activity: Never

## 2018-08-28 ENCOUNTER — Encounter (INDEPENDENT_AMBULATORY_CARE_PROVIDER_SITE_OTHER): Payer: Self-pay | Admitting: Orthopedic Surgery

## 2018-08-28 ENCOUNTER — Ambulatory Visit (INDEPENDENT_AMBULATORY_CARE_PROVIDER_SITE_OTHER): Payer: Medicare Other | Admitting: Orthopedic Surgery

## 2018-08-28 VITALS — Ht 60.0 in | Wt 113.0 lb

## 2018-08-28 DIAGNOSIS — M205X1 Other deformities of toe(s) (acquired), right foot: Secondary | ICD-10-CM

## 2018-08-28 NOTE — Progress Notes (Signed)
Post-Op Visit Note   Patient: Danielle Clarke           Date of Birth: 05/30/1941           MRN: [id number] Visit Date: 08/28/2018 PCP: Juluis Rainier, MD  Chief Complaint:  Chief Complaint  Patient presents with  . Right Foot - Routine Post Op    HPI:  HPI Patient is a 77 year old woman was seen 2 weeks status post claw toe surgery second toe on the right foot.  She has been cleansing her incision with peroxide and alcohol daily.  States is been wearing a mouse pad beneath the toe.  Has been heel walking in the postop shoe.  Ortho Exam Pin pulled today.  Incision approximated with sutures there is some fibrinous tissue and a little bit of wound breakdown proximally.  There is no surrounding cellulitis no drainage no odor no sign of infection  Visit Diagnoses:  1. Claw toe, acquired, right     Plan: Plan to however sutures next week.  May begin weightbearing as tolerated in her postoperative shoe.  Continue with daily incisional cleansing and dry dressings. Follow-Up Instructions: Return in about 9 days (around 09/06/2018).   Imaging: No results found.  Orders:  No orders of the defined types were placed in this encounter.  No orders of the defined types were placed in this encounter.    PMFS History: Patient Active Problem List   Diagnosis Date Noted  . Elevated troponin   . Abnormal EKG   . Bronchitis 08/05/2017  . Bilateral carotid artery stenosis   . Aortic stenosis 08/25/2015  . Cervical stenosis of spinal canal 07/31/2014  . CKD (chronic kidney disease) stage 2, GFR 60-89 ml/min 12/30/2013  . Essential hypertension, benign 12/30/2013  . Hyperlipidemia 12/30/2013  . Toe infection 12/30/2013  . Anemia 12/30/2013  . Acute renal failure (HCC) 12/29/2013  . Syncope 01/02/2013  . Diabetes mellitus type 1 (HCC) 01/02/2013  . Bilateral carotid artery disease (HCC) 01/02/2013  . GOITER, MULTINODULAR 02/11/2008   Past Medical History:  Diagnosis Date  .  Arthritis   . Asthmatic bronchitis    with colds per patient  . Cataract   . Glaucoma   . Hyperlipemia   . Hypertension    Dr. Katrinka Blazing ~ 2 years ago  . Neck fracture [facility])    july 2013  . Osteoporosis   . Type 1 diabetes mellitus (HCC)     Family History  Problem Relation Age of Onset  . Cancer Mother   . Heart Problems Father     Past Surgical History:  Procedure Laterality Date  . ANKLE FRACTURE SURGERY Left 2001   steel plate and 3 screws   . ANTERIOR CERVICAL CORPECTOMY N/A 07/31/2014   Procedure: Cervical Four to Cervical Six Corpectomy;  Surgeon: Karn Cassis, MD;  Location: [facility];  Service: Neurosurgery;  Laterality: N/A;  C4 to C6 Corpectomy  . BREAST SURGERY  1988  . CATARACT EXTRACTION W/ INTRAOCULAR LENS  IMPLANT, BILATERAL Bilateral 2011   right and then left  . EYE SURGERY    . FRACTURE SURGERY    . HAMMER TOE SURGERY  1998  . JOINT REPLACEMENT    . TONSILLECTOMY AND ADENOIDECTOMY  1948  . TOTAL SHOULDER REPLACEMENT  2010   right shoulder   . TUBAL LIGATION  1979  . TYMPANOSTOMY TUBE PLACEMENT Bilateral    Social History   Occupational History  . Occupation: Retired    Associate Professor:  RETIRED  Tobacco Use  . Smoking status: Never Smoker  . Smokeless tobacco: Never Used  Substance and Sexual Activity  . Alcohol use: Yes    Alcohol/week: 7.0 standard drinks    Types: 7 Glasses of wine per week  . Drug use: Yes    Types: Mescaline  . Sexual activity: Never

## 2018-08-30 ENCOUNTER — Telehealth (INDEPENDENT_AMBULATORY_CARE_PROVIDER_SITE_OTHER): Payer: Self-pay | Admitting: Orthopedic Surgery

## 2018-08-30 NOTE — Telephone Encounter (Signed)
Patient called asked if Dr Lajoyce Corners would write a letter for her stating she is not able to drive. Patient said she have not been able to take the road test. Patient said the [facility] is getting ready to take her drivers License indefinitely. Patient said very 2 years she have to do a medical examination because she is diabetic. The number to contact patient is [phone]

## 2018-09-02 NOTE — Telephone Encounter (Signed)
Patient just wants a letter stating about her recent surgery has caused her not to drive but she wants to continue drive and needs letter to be faxed to [facility]. Her upcoming appt is on 09/04/18, she will be bringing in paperwork.

## 2018-09-04 ENCOUNTER — Encounter (INDEPENDENT_AMBULATORY_CARE_PROVIDER_SITE_OTHER): Payer: Self-pay | Admitting: Family

## 2018-09-04 ENCOUNTER — Ambulatory Visit (INDEPENDENT_AMBULATORY_CARE_PROVIDER_SITE_OTHER): Payer: Medicare Other | Admitting: Orthopedic Surgery

## 2018-09-04 VITALS — Ht 60.0 in | Wt 113.0 lb

## 2018-09-04 DIAGNOSIS — M205X1 Other deformities of toe(s) (acquired), right foot: Secondary | ICD-10-CM

## 2018-09-04 MED ORDER — SULFAMETHOXAZOLE-TRIMETHOPRIM 800-160 MG PO TABS: 1.0000 | ORAL_TABLET | Freq: Two times a day (BID) | ORAL | 0 refills | Status: DC

## 2018-09-04 NOTE — Progress Notes (Signed)
Office Visit Note   Patient: Danielle Clarke           Date of Birth: 1941-05-22           MRN: [id number] Visit Date: 09/04/2018              Requested by: Juluis Rainier, MD [address] [place], [place] [address] PCP: Juluis Rainier, MD  Chief Complaint  Patient presents with  . Right Foot - Routine Post Op    08/14/18 right second toe claw surgery       HPI: Patient is a 77 year old woman who presents status post claw toe surgery right second toe.  She is noticed some redness she does not have symptoms of fever or chills.  Assessment & Plan: Visit Diagnoses:  1. Acquired claw toe of right foot     Plan: Orders are written for Septra DS continue with the mouse pad weightbearing as tolerated in the postoperative shoe  Follow-Up Instructions: Return in about 1 week (around 09/11/2018).   Ortho Exam  Patient is alert, oriented, no adenopathy, well-dressed, normal affect, normal respiratory effort. Examination patient has a area of redness approximately 2 cm in diameter this is over the MTP joint.  The wound has healed there is good granulation tissue the redness does go away with massage however patient is tender.  Will start her on Septra DS there is no drainage no odor.  Imaging: No results found. No images are attached to the encounter.  Labs: Lab Results  Component Value Date   HGBA1C 7.9 (H) 01/04/2017   REPTSTATUS 03/28/2008 FINAL 03/26/2008   CULT  03/26/2008    Multiple bacterial morphotypes present, none predominant. Suggest appropriate recollection if clinically indicated.     Lab Results  Component Value Date   ALBUMIN 3.4 (L) 01/05/2017   ALBUMIN 3.7 01/04/2017   ALBUMIN 3.9 10/07/2014    Body mass index is 22.07 kg/m.  Orders:  No orders of the defined types were placed in this encounter.  Meds ordered this encounter  Medications  . sulfamethoxazole-trimethoprim (BACTRIM DS,SEPTRA DS) 800-160 MG tablet    Sig: Take 1 tablet  by mouth 2 (two) times daily.    Dispense:  20 tablet    Refill:  0  . sulfamethoxazole-trimethoprim (BACTRIM DS,SEPTRA DS) 800-160 MG tablet    Sig: Take 1 tablet by mouth 2 (two) times daily.    Dispense:  20 tablet    Refill:  0     Procedures: No procedures performed  Clinical Data: No additional findings.  ROS:  All other systems negative, except as noted in the HPI. Review of Systems  Objective: Vital Signs: Ht 5' (1.524 m)   Wt 113 lb (51.3 kg)   BMI 22.07 kg/m   Specialty Comments:  No specialty comments available.  PMFS History: Patient Active Problem List   Diagnosis Date Noted  . Elevated troponin   . Abnormal EKG   . Bronchitis 08/05/2017  . Bilateral carotid artery stenosis   . Aortic stenosis 08/25/2015  . Cervical stenosis of spinal canal 07/31/2014  . CKD (chronic kidney disease) stage 2, GFR 60-89 ml/min 12/30/2013  . Essential hypertension, benign 12/30/2013  . Hyperlipidemia 12/30/2013  . Toe infection 12/30/2013  . Anemia 12/30/2013  . Acute renal failure (HCC) 12/29/2013  . Syncope 01/02/2013  . Diabetes mellitus type 1 (HCC) 01/02/2013  . Bilateral carotid artery disease (HCC) 01/02/2013  . GOITER, MULTINODULAR 02/11/2008   Past Medical History:  Diagnosis Date  .  Arthritis   . Asthmatic bronchitis    with colds per patient  . Cataract   . Glaucoma   . Hyperlipemia   . Hypertension    Dr. Katrinka Blazing ~ 2 years ago  . Neck fracture [facility])    july 2013  . Osteoporosis   . Type 1 diabetes mellitus (HCC)     Family History  Problem Relation Age of Onset  . Cancer Mother   . Heart Problems Father     Past Surgical History:  Procedure Laterality Date  . ANKLE FRACTURE SURGERY Left 2001   steel plate and 3 screws   . ANTERIOR CERVICAL CORPECTOMY N/A 07/31/2014   Procedure: Cervical Four to Cervical Six Corpectomy;  Surgeon: Karn Cassis, MD;  Location: [facility];  Service: Neurosurgery;  Laterality: N/A;  C4 to C6 Corpectomy  .  BREAST SURGERY  1988  . CATARACT EXTRACTION W/ INTRAOCULAR LENS  IMPLANT, BILATERAL Bilateral 2011   right and then left  . EYE SURGERY    . FRACTURE SURGERY    . HAMMER TOE SURGERY  1998  . JOINT REPLACEMENT    . TONSILLECTOMY AND ADENOIDECTOMY  1948  . TOTAL SHOULDER REPLACEMENT  2010   right shoulder   . TUBAL LIGATION  1979  . TYMPANOSTOMY TUBE PLACEMENT Bilateral    Social History   Occupational History  . Occupation: Retired    Associate Professor: RETIRED  Tobacco Use  . Smoking status: Never Smoker  . Smokeless tobacco: Never Used  Substance and Sexual Activity  . Alcohol use: Yes    Alcohol/week: 7.0 standard drinks    Types: 7 Glasses of wine per week  . Drug use: Yes    Types: Mescaline  . Sexual activity: Never

## 2018-09-04 NOTE — Telephone Encounter (Signed)
Patient is in office today discussing with Dr Lajoyce Corners her tx.

## 2018-09-11 ENCOUNTER — Ambulatory Visit (INDEPENDENT_AMBULATORY_CARE_PROVIDER_SITE_OTHER): Payer: Medicare Other | Admitting: Family

## 2018-09-11 ENCOUNTER — Encounter (INDEPENDENT_AMBULATORY_CARE_PROVIDER_SITE_OTHER): Payer: Self-pay | Admitting: Family

## 2018-09-11 ENCOUNTER — Ambulatory Visit (INDEPENDENT_AMBULATORY_CARE_PROVIDER_SITE_OTHER): Payer: Medicare Other | Admitting: Orthopedic Surgery

## 2018-09-11 VITALS — Ht 60.0 in | Wt 113.0 lb

## 2018-09-11 DIAGNOSIS — L089 Local infection of the skin and subcutaneous tissue, unspecified: Secondary | ICD-10-CM

## 2018-09-11 NOTE — Progress Notes (Signed)
Office Visit Note   Patient: Danielle Clarke           Date of Birth: 1941-04-24           MRN: [id number] Visit Date: 09/11/2018              Requested by: Juluis Rainier, MD [address] [place], [place] [address] PCP: Juluis Rainier, MD  Chief Complaint  Patient presents with  . Right Foot - Routine Post Op    Acquired claw toe Right foot      HPI: Patient is a 77 year old woman who presents status post claw toe surgery right second toe.  She has been on Bactrim. Has open area along incision. Has been doing dry dressings. Resolving redness. she does not have symptoms of fever or chills.  Assessment & Plan: Visit Diagnoses:  No diagnosis found.  Plan: continue with the mouse pad weightbearing as tolerated. May advance to regular shoe wear. To work on scar massage. Given plurogel for daily dressings. May use neosporin when out of this. Follow up in 2 weeks. Discussed return symptoms.  Follow-Up Instructions: No follow-ups on file.   Ortho Exam  Patient is alert, oriented, no adenopathy, well-dressed, normal affect, normal respiratory effort. Examination the cellulitis is resolving. The wound has is 2 mm in diameter and 1 mm deep. There is good granulation tissue. Less tenderness.   there is no drainage no odor.  Imaging: No results found. No images are attached to the encounter.  Labs: Lab Results  Component Value Date   HGBA1C 7.9 (H) 01/04/2017   REPTSTATUS 03/28/2008 FINAL 03/26/2008   CULT  03/26/2008    Multiple bacterial morphotypes present, none predominant. Suggest appropriate recollection if clinically indicated.     Lab Results  Component Value Date   ALBUMIN 3.4 (L) 01/05/2017   ALBUMIN 3.7 01/04/2017   ALBUMIN 3.9 10/07/2014    Body mass index is 22.07 kg/m.  Orders:  No orders of the defined types were placed in this encounter.  No orders of the defined types were placed in this encounter.    Procedures: No procedures  performed  Clinical Data: No additional findings.  ROS:  All other systems negative, except as noted in the HPI. Review of Systems  Constitutional: Negative for chills and fever.  Skin: Positive for wound. Negative for color change.    Objective: Vital Signs: Ht 5' (1.524 m)   Wt 113 lb (51.3 kg)   BMI 22.07 kg/m   Specialty Comments:  No specialty comments available.  PMFS History: Patient Active Problem List   Diagnosis Date Noted  . Elevated troponin   . Abnormal EKG   . Bronchitis 08/05/2017  . Bilateral carotid artery stenosis   . Aortic stenosis 08/25/2015  . Cervical stenosis of spinal canal 07/31/2014  . CKD (chronic kidney disease) stage 2, GFR 60-89 ml/min 12/30/2013  . Essential hypertension, benign 12/30/2013  . Hyperlipidemia 12/30/2013  . Toe infection 12/30/2013  . Anemia 12/30/2013  . Acute renal failure (HCC) 12/29/2013  . Syncope 01/02/2013  . Diabetes mellitus type 1 (HCC) 01/02/2013  . Bilateral carotid artery disease (HCC) 01/02/2013  . GOITER, MULTINODULAR 02/11/2008   Past Medical History:  Diagnosis Date  . Arthritis   . Asthmatic bronchitis    with colds per patient  . Cataract   . Glaucoma   . Hyperlipemia   . Hypertension    Dr. Katrinka Blazing ~ 2 years ago  . Neck fracture (HCC)  july 2013  . Osteoporosis   . Type 1 diabetes mellitus (HCC)     Family History  Problem Relation Age of Onset  . Cancer Mother   . Heart Problems Father     Past Surgical History:  Procedure Laterality Date  . ANKLE FRACTURE SURGERY Left 2001   steel plate and 3 screws   . ANTERIOR CERVICAL CORPECTOMY N/A 07/31/2014   Procedure: Cervical Four to Cervical Six Corpectomy;  Surgeon: Karn Cassis, MD;  Location: [facility];  Service: Neurosurgery;  Laterality: N/A;  C4 to C6 Corpectomy  . BREAST SURGERY  1988  . CATARACT EXTRACTION W/ INTRAOCULAR LENS  IMPLANT, BILATERAL Bilateral 2011   right and then left  . EYE SURGERY    . FRACTURE SURGERY      . HAMMER TOE SURGERY  1998  . JOINT REPLACEMENT    . TONSILLECTOMY AND ADENOIDECTOMY  1948  . TOTAL SHOULDER REPLACEMENT  2010   right shoulder   . TUBAL LIGATION  1979  . TYMPANOSTOMY TUBE PLACEMENT Bilateral    Social History   Occupational History  . Occupation: Retired    Associate Professor: RETIRED  Tobacco Use  . Smoking status: Never Smoker  . Smokeless tobacco: Never Used  Substance and Sexual Activity  . Alcohol use: Yes    Alcohol/week: 7.0 standard drinks    Types: 7 Glasses of wine per week  . Drug use: Yes    Types: Mescaline  . Sexual activity: Never

## 2018-09-25 ENCOUNTER — Ambulatory Visit (INDEPENDENT_AMBULATORY_CARE_PROVIDER_SITE_OTHER): Payer: Medicare Other | Admitting: Orthopedic Surgery

## 2018-09-25 ENCOUNTER — Encounter (INDEPENDENT_AMBULATORY_CARE_PROVIDER_SITE_OTHER): Payer: Self-pay | Admitting: Orthopedic Surgery

## 2018-09-25 VITALS — Ht 60.0 in | Wt 113.0 lb

## 2018-09-25 DIAGNOSIS — M205X1 Other deformities of toe(s) (acquired), right foot: Secondary | ICD-10-CM

## 2018-09-25 DIAGNOSIS — L089 Local infection of the skin and subcutaneous tissue, unspecified: Secondary | ICD-10-CM

## 2018-09-25 NOTE — Progress Notes (Signed)
Office Visit Note   Patient: Danielle Clarke           Date of Birth: 21-Jul-1941           MRN: [id number] Visit Date: 09/25/2018              Requested by: Juluis Rainier, MD [address] [place], [place] [address] PCP: Juluis Rainier, MD  Chief Complaint  Patient presents with  . Right Foot - Routine Post Op    08/14/18 right foot 2nd toe claw toe surgery       HPI: Patient is a 77 year old woman who presents today status post right second toe claw toe repair on September 18.  Has had ongoing issues with infection along her incision this is just over the MTP joint.  Today this is red extremely tender.  She has not had any drainage.  Has completed a Bactrim course.  Feels she cannot bear weight on her forefoot due to pain.  Assessment & Plan: Visit Diagnoses:  1. Toe infection   2. Acquired claw toe of right foot     Plan: We will plan for surgery on Friday have discussed this with Dr. Lajoyce Corners.  She will be called by Elnita Maxwell to make arrangements for a washout this Friday will call in oral antibiotics.  Discussed strict return precautions.  Follow-Up Instructions: No follow-ups on file.   Ortho Exam  Patient is alert, oriented, no adenopathy, well-dressed, normal affect, normal respiratory effort. See attached image.  There is surrounding cellulitis and purulent drainage.  No odor.  No induration no ascending cellulitis.  Sausage digit swelling.  Imaging: No results found.   Labs: Lab Results  Component Value Date   HGBA1C 7.9 (H) 01/04/2017   REPTSTATUS 03/28/2008 FINAL 03/26/2008   CULT  03/26/2008    Multiple bacterial morphotypes present, none predominant. Suggest appropriate recollection if clinically indicated.     Lab Results  Component Value Date   ALBUMIN 3.4 (L) 01/05/2017   ALBUMIN 3.7 01/04/2017   ALBUMIN 3.9 10/07/2014    Body mass index is 22.07 kg/m.  Orders:  Orders Placed This Encounter  Procedures  . WOUND CULTURE   No  orders of the defined types were placed in this encounter.    Procedures: No procedures performed  Clinical Data: No additional findings.  ROS:  All other systems negative, except as noted in the HPI. Review of Systems  Constitutional: Negative for chills and fever.  Musculoskeletal: Positive for joint swelling.  Skin: Positive for color change, rash and wound.    Objective: Vital Signs: Ht 5' (1.524 m)   Wt 113 lb (51.3 kg)   BMI 22.07 kg/m   Specialty Comments:  No specialty comments available.  PMFS History: Patient Active Problem List   Diagnosis Date Noted  . Elevated troponin   . Abnormal EKG   . Bronchitis 08/05/2017  . Bilateral carotid artery stenosis   . Aortic stenosis 08/25/2015  . Cervical stenosis of spinal canal 07/31/2014  . CKD (chronic kidney disease) stage 2, GFR 60-89 ml/min 12/30/2013  . Essential hypertension, benign 12/30/2013  . Hyperlipidemia 12/30/2013  . Toe infection 12/30/2013  . Anemia 12/30/2013  . Acute renal failure (HCC) 12/29/2013  . Syncope 01/02/2013  . Diabetes mellitus type 1 (HCC) 01/02/2013  . Bilateral carotid artery disease (HCC) 01/02/2013  . GOITER, MULTINODULAR 02/11/2008   Past Medical History:  Diagnosis Date  . Arthritis   . Asthmatic bronchitis    with colds per  patient  . Cataract   . Glaucoma   . Hyperlipemia   . Hypertension    Dr. Katrinka Blazing ~ 2 years ago  . Neck fracture [facility])    july 2013  . Osteoporosis   . Type 1 diabetes mellitus (HCC)     Family History  Problem Relation Age of Onset  . Cancer Mother   . Heart Problems Father     Past Surgical History:  Procedure Laterality Date  . ANKLE FRACTURE SURGERY Left 2001   steel plate and 3 screws   . ANTERIOR CERVICAL CORPECTOMY N/A 07/31/2014   Procedure: Cervical Four to Cervical Six Corpectomy;  Surgeon: Karn Cassis, MD;  Location: [facility];  Service: Neurosurgery;  Laterality: N/A;  C4 to C6 Corpectomy  . BREAST SURGERY  1988  .  CATARACT EXTRACTION W/ INTRAOCULAR LENS  IMPLANT, BILATERAL Bilateral 2011   right and then left  . EYE SURGERY    . FRACTURE SURGERY    . HAMMER TOE SURGERY  1998  . JOINT REPLACEMENT    . TONSILLECTOMY AND ADENOIDECTOMY  1948  . TOTAL SHOULDER REPLACEMENT  2010   right shoulder   . TUBAL LIGATION  1979  . TYMPANOSTOMY TUBE PLACEMENT Bilateral    Social History   Occupational History  . Occupation: Retired    Associate Professor: RETIRED  Tobacco Use  . Smoking status: Never Smoker  . Smokeless tobacco: Never Used  Substance and Sexual Activity  . Alcohol use: Yes    Alcohol/week: 7.0 standard drinks    Types: 7 Glasses of wine per week  . Drug use: Yes    Types: Mescaline  . Sexual activity: Never

## 2018-09-26 ENCOUNTER — Other Ambulatory Visit (INDEPENDENT_AMBULATORY_CARE_PROVIDER_SITE_OTHER): Payer: Self-pay

## 2018-09-26 ENCOUNTER — Ambulatory Visit (INDEPENDENT_AMBULATORY_CARE_PROVIDER_SITE_OTHER): Payer: Self-pay | Admitting: Physician Assistant

## 2018-09-26 ENCOUNTER — Other Ambulatory Visit (INDEPENDENT_AMBULATORY_CARE_PROVIDER_SITE_OTHER): Payer: Self-pay | Admitting: Orthopedic Surgery

## 2018-09-26 ENCOUNTER — Other Ambulatory Visit: Payer: Self-pay

## 2018-09-26 ENCOUNTER — Encounter (HOSPITAL_COMMUNITY): Payer: Self-pay | Admitting: *Deleted

## 2018-09-26 MED ORDER — SULFAMETHOXAZOLE-TRIMETHOPRIM 800-160 MG PO TABS: 1.0000 | ORAL_TABLET | Freq: Two times a day (BID) | ORAL | 0 refills | Status: DC

## 2018-09-26 NOTE — Progress Notes (Addendum)
Danielle Clarke denies chest pain or shortness of breath.  Patient has Type I diabetes. Patient states that her last A1C -  was 7. Endocrinologist is Dr Horald Pollen. Dr Katrinka Blazing  Is patient's cardiologist. PCP is Dr Catha Gosselin. I instructed patient to take 50 % of SS if CBG is greater than 220. I instructed patient to check CBG after awaking and every 2 hours until arrival  to the hospital. If CBG > 70 take 6 units of Tresiba.  I Instructed patient if CBG is less than 70 to take 4 Glucose Tablets. Recheck CBG in 15 minutes then call pre- op desk at [phone] for further instructions. If scheduled to receive Insulin, do not take Insulin

## 2018-09-26 NOTE — Progress Notes (Signed)
bactrim

## 2018-09-27 ENCOUNTER — Ambulatory Visit (HOSPITAL_COMMUNITY): Payer: Medicare Other | Admitting: Certified Registered Nurse Anesthetist

## 2018-09-27 ENCOUNTER — Encounter (HOSPITAL_COMMUNITY)
Admission: RE | Disposition: A | Discharge status: Home / Self Care | Payer: Self-pay | Source: Ambulatory Visit | Attending: Orthopedic Surgery

## 2018-09-27 ENCOUNTER — Encounter (HOSPITAL_COMMUNITY): Payer: Self-pay | Admitting: Urology

## 2018-09-27 ENCOUNTER — Ambulatory Visit (HOSPITAL_COMMUNITY)
Admission: RE | Admit: 2018-09-27 | Discharge: 2018-09-27 | Disposition: A | Discharge status: Home / Self Care | Payer: Medicare Other | Source: Ambulatory Visit | Attending: Orthopedic Surgery | Admitting: Orthopedic Surgery

## 2018-09-27 DIAGNOSIS — Z961 Presence of intraocular lens: Secondary | ICD-10-CM | POA: Diagnosis not present

## 2018-09-27 DIAGNOSIS — Z9842 Cataract extraction status, left eye: Secondary | ICD-10-CM | POA: Diagnosis not present

## 2018-09-27 DIAGNOSIS — M1990 Unspecified osteoarthritis, unspecified site: Secondary | ICD-10-CM | POA: Insufficient documentation

## 2018-09-27 DIAGNOSIS — Z9841 Cataract extraction status, right eye: Secondary | ICD-10-CM | POA: Insufficient documentation

## 2018-09-27 DIAGNOSIS — L02611 Cutaneous abscess of right foot: Secondary | ICD-10-CM

## 2018-09-27 DIAGNOSIS — Z888 Allergy status to other drugs, medicaments and biological substances status: Secondary | ICD-10-CM | POA: Insufficient documentation

## 2018-09-27 DIAGNOSIS — Z880 Allergy status to penicillin: Secondary | ICD-10-CM | POA: Insufficient documentation

## 2018-09-27 DIAGNOSIS — Z794 Long term (current) use of insulin: Secondary | ICD-10-CM | POA: Diagnosis not present

## 2018-09-27 DIAGNOSIS — Z79899 Other long term (current) drug therapy: Secondary | ICD-10-CM | POA: Insufficient documentation

## 2018-09-27 DIAGNOSIS — H409 Unspecified glaucoma: Secondary | ICD-10-CM | POA: Insufficient documentation

## 2018-09-27 DIAGNOSIS — Z881 Allergy status to other antibiotic agents status: Secondary | ICD-10-CM | POA: Insufficient documentation

## 2018-09-27 DIAGNOSIS — Z7982 Long term (current) use of aspirin: Secondary | ICD-10-CM | POA: Insufficient documentation

## 2018-09-27 DIAGNOSIS — Z96611 Presence of right artificial shoulder joint: Secondary | ICD-10-CM | POA: Diagnosis not present

## 2018-09-27 DIAGNOSIS — Z87892 Personal history of anaphylaxis: Secondary | ICD-10-CM | POA: Insufficient documentation

## 2018-09-27 DIAGNOSIS — E109 Type 1 diabetes mellitus without complications: Secondary | ICD-10-CM | POA: Insufficient documentation

## 2018-09-27 DIAGNOSIS — J45909 Unspecified asthma, uncomplicated: Secondary | ICD-10-CM | POA: Insufficient documentation

## 2018-09-27 DIAGNOSIS — I10 Essential (primary) hypertension: Secondary | ICD-10-CM | POA: Diagnosis not present

## 2018-09-27 DIAGNOSIS — E785 Hyperlipidemia, unspecified: Secondary | ICD-10-CM | POA: Diagnosis not present

## 2018-09-27 HISTORY — PX: I & D EXTREMITY: SHX5045

## 2018-09-27 HISTORY — DX: Cardiac murmur, unspecified: R01.1

## 2018-09-27 LAB — BASIC METABOLIC PANEL
Anion gap: 9 (ref 5–15)
BUN: 12 mg/dL (ref 8–23)
CO2: 25 mmol/L (ref 22–32)
Calcium: 8.6 mg/dL — ABNORMAL LOW (ref 8.9–10.3)
Chloride: 104 mmol/L (ref 98–111)
Creatinine, Ser: 0.95 mg/dL (ref 0.44–1.00)
GFR calc Af Amer: >60 mL/min (ref >60)
GFR calc non Af Amer: 57 mL/min — ABNORMAL LOW (ref >60)
Glucose, Bld: 140 mg/dL — ABNORMAL HIGH (ref 70–99)
Potassium: 4.4 mmol/L (ref 3.5–5.1)
Sodium: 138 mmol/L (ref 135–145)

## 2018-09-27 LAB — GLUCOSE, CAPILLARY
Glucose-Capillary: 103 mg/dL — ABNORMAL HIGH (ref 70–99)
Glucose-Capillary: 134 mg/dL — ABNORMAL HIGH (ref 70–99)
Glucose-Capillary: 171 mg/dL — ABNORMAL HIGH (ref 70–99)

## 2018-09-27 SURGERY — IRRIGATION AND DEBRIDEMENT EXTREMITY
Anesthesia: General | Site: Foot | Laterality: Right

## 2018-09-27 MED ORDER — LIDOCAINE 2% (20 MG/ML) 5 ML SYRINGE
INTRAMUSCULAR | Status: AC
  Filled 2018-09-27: qty 5

## 2018-09-27 MED ORDER — FENTANYL CITRATE (PF) 250 MCG/5ML IJ SOLN
INTRAMUSCULAR | Status: AC
  Filled 2018-09-27: qty 5

## 2018-09-27 MED ORDER — PROPOFOL 10 MG/ML IV BOLUS
INTRAVENOUS | Status: DC | PRN
  Administered 2018-09-27: 150 mg via INTRAVENOUS

## 2018-09-27 MED ORDER — LACTATED RINGERS IV SOLN
INTRAVENOUS | Status: DC
  Administered 2018-09-27: 09:00:00 via INTRAVENOUS

## 2018-09-27 MED ORDER — METOCLOPRAMIDE HCL 5 MG/ML IJ SOLN: 10.0000 mg | Freq: Once | INTRAMUSCULAR | Status: DC | PRN

## 2018-09-27 MED ORDER — DEXAMETHASONE SODIUM PHOSPHATE 10 MG/ML IJ SOLN
INTRAMUSCULAR | Status: DC | PRN
  Administered 2018-09-27: 10 mg via INTRAVENOUS

## 2018-09-27 MED ORDER — FENTANYL CITRATE (PF) 100 MCG/2ML IJ SOLN
INTRAMUSCULAR | Status: DC | PRN
  Administered 2018-09-27: 50 ug via INTRAVENOUS
  Administered 2018-09-27: 25 ug via INTRAVENOUS
  Administered 2018-09-27: 50 ug via INTRAVENOUS

## 2018-09-27 MED ORDER — CLINDAMYCIN PHOSPHATE 600 MG/50ML IV SOLN
INTRAVENOUS | Status: AC
  Filled 2018-09-27: qty 50

## 2018-09-27 MED ORDER — FENTANYL CITRATE (PF) 100 MCG/2ML IJ SOLN
INTRAMUSCULAR | Status: AC
  Administered 2018-09-27: 50 ug via INTRAVENOUS
  Filled 2018-09-27: qty 2

## 2018-09-27 MED ORDER — TRAMADOL HCL 50 MG PO TABS: 50.0000 mg | ORAL_TABLET | Freq: Four times a day (QID) | ORAL | 0 refills | Status: DC | PRN

## 2018-09-27 MED ORDER — ONDANSETRON HCL 4 MG/2ML IJ SOLN
INTRAMUSCULAR | Status: AC
  Filled 2018-09-27: qty 2

## 2018-09-27 MED ORDER — MEPERIDINE HCL 50 MG/ML IJ SOLN: 6.2500 mg | INTRAMUSCULAR | Status: DC | PRN

## 2018-09-27 MED ORDER — CLINDAMYCIN PHOSPHATE 900 MG/50ML IV SOLN
900.0000 mg | INTRAVENOUS | Status: AC
  Administered 2018-09-27: 900 mg via INTRAVENOUS
  Filled 2018-09-27: qty 50

## 2018-09-27 MED ORDER — PHENYLEPHRINE 40 MCG/ML (10ML) SYRINGE FOR IV PUSH (FOR BLOOD PRESSURE SUPPORT)
PREFILLED_SYRINGE | INTRAVENOUS | Status: AC
  Filled 2018-09-27: qty 10

## 2018-09-27 MED ORDER — SULFAMETHOXAZOLE-TRIMETHOPRIM 800-160 MG PO TABS: 1.0000 | ORAL_TABLET | Freq: Two times a day (BID) | ORAL | 0 refills | Status: DC

## 2018-09-27 MED ORDER — DEXAMETHASONE SODIUM PHOSPHATE 10 MG/ML IJ SOLN
INTRAMUSCULAR | Status: AC
  Filled 2018-09-27: qty 1

## 2018-09-27 MED ORDER — 0.9 % SODIUM CHLORIDE (POUR BTL) OPTIME
TOPICAL | Status: DC | PRN
  Administered 2018-09-27: 1000 mL

## 2018-09-27 MED ORDER — CHLORHEXIDINE GLUCONATE 4 % EX LIQD: 60.0000 mL | Freq: Once | CUTANEOUS | Status: DC

## 2018-09-27 MED ORDER — LIDOCAINE HCL (CARDIAC) PF 100 MG/5ML IV SOSY
PREFILLED_SYRINGE | INTRAVENOUS | Status: DC | PRN
  Administered 2018-09-27: 60 mg via INTRATRACHEAL

## 2018-09-27 MED ORDER — FENTANYL CITRATE (PF) 100 MCG/2ML IJ SOLN
25.0000 ug | INTRAMUSCULAR | Status: DC | PRN
  Administered 2018-09-27 (×2): 50 ug via INTRAVENOUS

## 2018-09-27 MED ORDER — ONDANSETRON HCL 4 MG/2ML IJ SOLN
INTRAMUSCULAR | Status: DC | PRN
  Administered 2018-09-27: 4 mg via INTRAVENOUS

## 2018-09-27 SURGICAL SUPPLY — 37 items
BLADE SURG 21 STRL SS (BLADE) ×2 IMPLANT
BNDG GAUZE ELAST 4 BULKY (GAUZE/BANDAGES/DRESSINGS) ×1 IMPLANT
COVER SURGICAL LIGHT HANDLE (MISCELLANEOUS) ×3 IMPLANT
COVER WAND RF STERILE (DRAPES) ×2 IMPLANT
DRAPE U-SHAPE 47X51 STRL (DRAPES) ×2 IMPLANT
DRSG ADAPTIC 3X8 NADH LF (GAUZE/BANDAGES/DRESSINGS) ×1 IMPLANT
DURAPREP 26ML APPLICATOR (WOUND CARE) ×2 IMPLANT
ELECT REM PT RETURN 9FT ADLT (ELECTROSURGICAL) ×2
ELECTRODE REM PT RTRN 9FT ADLT (ELECTROSURGICAL) IMPLANT
GAUZE SPONGE 4X4 12PLY STRL (GAUZE/BANDAGES/DRESSINGS) ×1 IMPLANT
GLOVE BIOGEL PI IND STRL 6.5 (GLOVE) IMPLANT
GLOVE BIOGEL PI IND STRL 9 (GLOVE) ×1 IMPLANT
GLOVE BIOGEL PI INDICATOR 6.5 (GLOVE) ×1
GLOVE BIOGEL PI INDICATOR 9 (GLOVE) ×1
GLOVE ECLIPSE 6.5 STRL STRAW (GLOVE) ×1 IMPLANT
GLOVE SURG ORTHO 9.0 STRL STRW (GLOVE) ×2 IMPLANT
GOWN STRL REUS W/ TWL LRG LVL3 (GOWN DISPOSABLE) IMPLANT
GOWN STRL REUS W/ TWL XL LVL3 (GOWN DISPOSABLE) ×2 IMPLANT
GOWN STRL REUS W/TWL 2XL LVL3 (GOWN DISPOSABLE) ×1 IMPLANT
GOWN STRL REUS W/TWL LRG LVL3 (GOWN DISPOSABLE) ×2
GOWN STRL REUS W/TWL XL LVL3 (GOWN DISPOSABLE) ×2
HANDPIECE INTERPULSE COAX TIP (DISPOSABLE)
KIT BASIN OR (CUSTOM PROCEDURE TRAY) ×2 IMPLANT
KIT TURNOVER KIT B (KITS) ×2 IMPLANT
MANIFOLD NEPTUNE II (INSTRUMENTS) ×2 IMPLANT
NS IRRIG 1000ML POUR BTL (IV SOLUTION) ×2 IMPLANT
PACK ORTHO EXTREMITY (CUSTOM PROCEDURE TRAY) ×2 IMPLANT
PAD ABD 8X10 STRL (GAUZE/BANDAGES/DRESSINGS) ×1 IMPLANT
PAD ARMBOARD 7.5X6 YLW CONV (MISCELLANEOUS) ×3 IMPLANT
SET HNDPC FAN SPRY TIP SCT (DISPOSABLE) IMPLANT
STOCKINETTE IMPERVIOUS 9X36 MD (GAUZE/BANDAGES/DRESSINGS) IMPLANT
SUT ETHILON 2 0 PSLX (SUTURE) ×2 IMPLANT
SWAB COLLECTION DEVICE MRSA (MISCELLANEOUS) ×1 IMPLANT
SWAB CULTURE ESWAB REG 1ML (MISCELLANEOUS) ×1 IMPLANT
TOWEL GREEN STERILE (TOWEL DISPOSABLE) ×1 IMPLANT
TUBE CONNECTING 12X1/4 (SUCTIONS) ×2 IMPLANT
YANKAUER SUCT BULB TIP NO VENT (SUCTIONS) ×2 IMPLANT

## 2018-09-27 NOTE — H&P (Signed)
Danielle Clarke is an 77 y.o. female.   Chief Complaint: Painful cellulitis right foot second toe. HPI: Patient is a 77 year old woman is status post claw toe surgery.  She has had cellulitis she has been treated with oral antibiotics.  Patient states the cellulitis has worsened she has increased pain and difficulty weightbearing.  Patient does have a history of diabetes.  Past Medical History:  Diagnosis Date  . Arthritis   . Asthmatic bronchitis    with colds per patient  . Cataract   . Glaucoma   . Heart murmur    Dr Katrinka Blazing is her  cardiologist.   . Hyperlipemia   . Hypertension    Dr. Katrinka Blazing ~ 2 years ago  . Neck fracture [facility]le [facility])    july 2013  . Osteoporosis   . Type 1 diabetes mellitus (HCC)     Past Surgical History:  Procedure Laterality Date  . ANKLE FRACTURE SURGERY Left 2001   steel plate and 3 screws   . ANTERIOR CERVICAL CORPECTOMY N/A 07/31/2014   Procedure: Cervical Four to Cervical Six Corpectomy;  Surgeon: Danielle Cassis, MD;  Location: [facility];  Service: Neurosurgery;  Laterality: N/A;  C4 to C6 Corpectomy  . BREAST SURGERY  1988  . CATARACT EXTRACTION W/ INTRAOCULAR LENS  IMPLANT, BILATERAL Bilateral 2011   right and then left  . EYE SURGERY    . FRACTURE SURGERY    . HAMMER TOE SURGERY  1998  . JOINT REPLACEMENT    . TONSILLECTOMY AND ADENOIDECTOMY  1948  . TOTAL SHOULDER REPLACEMENT  2010   right shoulder   . TUBAL LIGATION  1979  . TYMPANOSTOMY TUBE PLACEMENT Bilateral     Family History  Problem Relation Age of Onset  . Cancer Mother   . Heart Problems Father    Social History:  reports that she has never smoked. She has never used smokeless tobacco. She reports that she drinks about 7.0 standard drinks of alcohol per week. Her drug history is not on file.  Allergies:  Allergies  Allergen Reactions  . Cefaclor Anaphylaxis  . Tetracycline Anaphylaxis  . Vancomycin Anaphylaxis  . Augmentin [Amoxicillin-Pot Clavulanate] Other (See  Comments)    Reaction unknown >> Anaphylaxis Has patient had a PCN reaction causing immediate rash, facial/tongue/throat swelling, SOB or lightheadedness with hypotension: Unknown Has patient had a PCN reaction causing severe rash involving mucus membranes or skin necrosis: Unknown Has patient had a PCN reaction that required hospitalization: Unknown Has patient had a PCN reaction occurring within the last 10 years: Unknown If all of the above answers are "NO", then may proceed with Cephalosporin use.   . Prednisone Other (See Comments)    Reaction unknown    Medications Prior to Admission  Medication Sig Dispense Refill  . acetaminophen (TYLENOL) 500 MG tablet Take 500 mg by mouth 4 (four) times daily.     . Chromium Picolinate 1000 MCG TABS Take 1,000 mcg by mouth daily at 12 noon.     Marland Kitchen CINNAMON PO Take 1,000 mg by mouth 4 (four) times daily.    . Coenzyme Q10 (COQ10) 200 MG CAPS Take 200 mg by mouth daily.     Marland Kitchen HUMALOG KWIKPEN 100 UNIT/ML KiwkPen Inject 2-13 Units into the skin 4 (four) times daily. Sliding Scale Insulin  3  . Liniments (SALONPAS PAIN RELIEF PATCH EX) Place 1 patch onto the skin daily as needed (for pain.).    Marland Kitchen Multiple Vitamin (MULTIVITAMIN WITH MINERALS) TABS tablet  Take 1 tablet by mouth daily.    Bertram Gala Glycol-Propyl Glycol (SYSTANE OP) Place 1 drop into both eyes 3 (three) times daily as needed (dry eyes).     . simvastatin (ZOCOR) 40 MG tablet Take 40 mg by mouth daily at 8 pm.     . traMADol (ULTRAM) 50 MG tablet Take 50 mg by mouth daily at 8 pm.     . TRESIBA 100 UNIT/ML SOLN Inject 8 Units into the skin daily.  6  . Turmeric 500 MG CAPS Take 500 mg by mouth daily at 12 noon.     Marland Kitchen albuterol (PROVENTIL HFA;VENTOLIN HFA) 108 (90 Base) MCG/ACT inhaler Inhale 2 puffs into the lungs every 6 (six) hours as needed for wheezing or shortness of breath. 1 Inhaler 2  . aspirin EC 81 MG tablet Take 81 mg by mouth daily.    Marland Kitchen sulfamethoxazole-trimethoprim (BACTRIM  DS,SEPTRA DS) 800-160 MG tablet Take 1 tablet by mouth 2 (two) times daily. (Patient not taking: Reported on 09/26/2018) 20 tablet 0  . sulfamethoxazole-trimethoprim (BACTRIM DS,SEPTRA DS) 800-160 MG tablet Take 1 tablet by mouth 2 (two) times daily. 6 tablet 0    Results for orders placed or performed during the Clarke encounter of 09/27/18 (from the past 48 hour(s))  Glucose, capillary     Status: Abnormal   Collection Time: 09/27/18  7:39 AM  Result Value Ref Range   Glucose-Capillary 103 (H) 70 - 99 mg/dL   Comment 1 Notify RN    Comment 2 Document in Chart    No results found.  Review of Systems  All other systems reviewed and are negative.   Blood pressure (!) 153/49, pulse 72, temperature 98.1 F (36.7 C), temperature source Oral, resp. rate 20, height 5' (1.524 m), weight 52.2 kg, SpO2 100 %. Physical Exam  Examination patient is alert oriented no adenopathy well-dressed normal affect normal respiratory effort.  Examination of the right foot she has cellulitis swelling and ulceration over the surgical incision right foot second toe she has a palpable pulse.  There is no cellulitis proximal to the MTP joint. Assessment/Plan Assessment: Cellulitis right foot second toe status post second toe claw toe surgery.  Plan: We will plan for surgical irrigation and debridement.  Risks and benefits were discussed including persistent infection need for additional surgery.  Patient states she understands wished to proceed at this time.  Nadara Mustard, MD 09/27/2018, 8:16 AM

## 2018-09-27 NOTE — Anesthesia Postprocedure Evaluation (Signed)
Anesthesia Post Note  Patient: Danielle Clarke  Procedure(s) Performed: IRRIGATION AND DEBRIDEMENT RIGHT FOOT (Right Foot)     Patient location during evaluation: PACU Anesthesia Type: General Level of consciousness: awake and alert Pain management: pain level controlled Vital Signs Assessment: post-procedure vital signs reviewed and stable Respiratory status: spontaneous breathing, nonlabored ventilation, respiratory function stable and patient connected to nasal cannula oxygen Cardiovascular status: blood pressure returned to baseline and stable Postop Assessment: no apparent nausea or vomiting Anesthetic complications: no    Last Vitals:  Vitals:   09/27/18 1153 09/27/18 1210  BP: (!) 147/46   Pulse: 73   Resp: (!) 28   Temp:  36.5 C  SpO2: 99%     Last Pain:  Vitals:   09/27/18 1210  TempSrc:   PainSc: 0-No pain                 Phillips Grout

## 2018-09-27 NOTE — Transfer of Care (Signed)
Immediate Anesthesia Transfer of Care Note  Patient: Danielle Clarke  Procedure(s) Performed: IRRIGATION AND DEBRIDEMENT RIGHT FOOT (Right Foot)  Patient Location: PACU  Anesthesia Type:General  Level of Consciousness: awake and alert   Airway & Oxygen Therapy: Patient Spontanous Breathing and Patient connected to face mask oxygen  Post-op Assessment: Report given to RN, Post -op Vital signs reviewed and stable and Patient moving all extremities X 4  Post vital signs: Reviewed and stable  Last Vitals:  Vitals Value Taken Time  BP 135/50 09/27/2018 10:38 AM  Temp    Pulse 71 09/27/2018 10:38 AM  Resp 11 09/27/2018 10:38 AM  SpO2 100 % 09/27/2018 10:38 AM  Vitals shown include unvalidated device data.  Last Pain:  Vitals:   09/27/18 1039  TempSrc:   PainSc: (P) 0-No pain      Patients Stated Pain Goal: 1 (09/27/18 0847)  Complications: No apparent anesthesia complications

## 2018-09-27 NOTE — Anesthesia Preprocedure Evaluation (Signed)
Anesthesia Evaluation  Patient identified by MRN, date of birth, ID band Patient awake    Reviewed: Allergy & Precautions, NPO status , Patient's Chart, lab work & pertinent test results  Airway Mallampati: II  TM Distance: >3 FB Neck ROM: Full    Dental no notable dental hx.    Pulmonary asthma ,    Pulmonary exam normal breath sounds clear to auscultation       Cardiovascular negative cardio ROS Normal cardiovascular exam Rhythm:Regular Rate:Normal     Neuro/Psych negative neurological ROS  negative psych ROS   GI/Hepatic negative GI ROS, Neg liver ROS,   Endo/Other  diabetes, Well Controlled, Type 1, Insulin Dependent  Renal/GU negative Renal ROS  negative genitourinary   Musculoskeletal negative musculoskeletal ROS (+)   Abdominal   Peds negative pediatric ROS (+)  Hematology negative hematology ROS (+)   Anesthesia Other Findings   Reproductive/Obstetrics negative OB ROS                             Anesthesia Physical Anesthesia Plan  ASA: III  Anesthesia Plan: General   Post-op Pain Management:    Induction: Intravenous  PONV Risk Score and Plan: 3 and Ondansetron and Treatment may vary due to age or medical condition  Airway Management Planned: LMA  Additional Equipment:   Intra-op Plan:   Post-operative Plan:   Informed Consent: I have reviewed the patients History and Physical, chart, labs and discussed the procedure including the risks, benefits and alternatives for the proposed anesthesia with the patient or authorized representative who has indicated his/her understanding and acceptance.   Dental advisory given  Plan Discussed with: CRNA  Anesthesia Plan Comments:         Anesthesia Quick Evaluation

## 2018-09-27 NOTE — Op Note (Signed)
09/27/2018  10:36 AM  PATIENT:  Danielle Clarke    PRE-OPERATIVE DIAGNOSIS:  Abscess Right Foot  POST-OPERATIVE DIAGNOSIS:  Same  PROCEDURE:  IRRIGATION AND DEBRIDEMENT RIGHT FOOT at the MTP joint second toe with excision of skin and soft tissue tendon and bone.  Cultures obtained x2.  SURGEON:  Nadara Mustard, MD  PHYSICIAN ASSISTANT:None ANESTHESIA:   General  PREOPERATIVE INDICATIONS:  Danielle Clarke is a  77 y.o. female with a diagnosis of Abscess Right Foot who failed conservative measures and elected for surgical management.    The risks benefits and alternatives were discussed with the patient preoperatively including but not limited to the risks of infection, bleeding, nerve injury, cardiopulmonary complications, the need for revision surgery, among others, and the patient was willing to proceed.  OPERATIVE IMPLANTS: None  @ENCIMAGES @  OPERATIVE FINDINGS: Abscess at the MTP joint right second toe  OPERATIVE PROCEDURE: Patient was brought the operating room and underwent a general anesthetic.  After adequate levels of anesthesia were obtained patient's right lower extremity was prepped using DuraPrep draped in the sterile field a timeout was called.  A longitudinal elliptical incision was made around the ulcerative tissue this was resected in one block of tissue.  There was a purulent abscess at the MTP joint.  Cultures were obtained x2.  The retained hardware was removed the proximal aspect of the proximal phalanx was resected this appeared to be involved with the infection further soft tissue was debrided back to healthy viable tissue.  The wound was irrigated with normal saline.  The incision was closed using 2-0 nylon a sterile dressing was applied.   DISCHARGE PLANNING:  Antibiotic duration: Preoperative antibiotics plus continue Septra DS at discharge.  Weightbearing: Touchdown weightbearing on the right  Pain medication: Prescription for tramadol  Dressing  care/ Wound VAC: Dry dressing changes follow-up  Ambulatory devices: Walker  Discharge to: Home.  Follow-up: In the office 1 week post operative.

## 2018-09-27 NOTE — Anesthesia Procedure Notes (Signed)
Procedure Name: LMA Insertion Date/Time: 09/27/2018 10:11 AM Performed by: Modena Morrow, CRNA Pre-anesthesia Checklist: Patient identified Patient Re-evaluated:Patient Re-evaluated prior to induction Oxygen Delivery Method: Circle system utilized Preoxygenation: Pre-oxygenation with 100% oxygen Induction Type: IV induction Ventilation: Mask ventilation without difficulty LMA: LMA inserted LMA Size: 3.0 Number of attempts: 1 Placement Confirmation: ETT inserted through vocal cords under direct vision,  positive ETCO2 and breath sounds checked- equal and bilateral Tube secured with: Tape

## 2018-09-28 ENCOUNTER — Encounter (HOSPITAL_COMMUNITY): Payer: Self-pay | Admitting: Orthopedic Surgery

## 2018-09-28 LAB — WOUND CULTURE
MICRO NUMBER:: 91306146
SPECIMEN QUALITY:: ADEQUATE

## 2018-09-30 ENCOUNTER — Telehealth (INDEPENDENT_AMBULATORY_CARE_PROVIDER_SITE_OTHER): Payer: Self-pay | Admitting: Physician Assistant

## 2018-09-30 NOTE — Telephone Encounter (Signed)
Called patient concerning results of cultures from OR with MSSA and counseled to continue taking Bactrim DS 1 po BID as prescribed. Patient reports she is doing well except for some mild nausea with the Bactrim when she took on empty stomach.  She has a follow up appointment for this Friday.

## 2018-10-02 LAB — AEROBIC/ANAEROBIC CULTURE W GRAM STAIN (SURGICAL/DEEP WOUND)

## 2018-10-02 LAB — AEROBIC/ANAEROBIC CULTURE (SURGICAL/DEEP WOUND)

## 2018-10-04 ENCOUNTER — Encounter (INDEPENDENT_AMBULATORY_CARE_PROVIDER_SITE_OTHER): Payer: Self-pay | Admitting: Physician Assistant

## 2018-10-04 ENCOUNTER — Telehealth (INDEPENDENT_AMBULATORY_CARE_PROVIDER_SITE_OTHER): Payer: Self-pay | Admitting: Radiology

## 2018-10-04 ENCOUNTER — Ambulatory Visit (INDEPENDENT_AMBULATORY_CARE_PROVIDER_SITE_OTHER): Payer: Medicare Other | Admitting: Physician Assistant

## 2018-10-04 VITALS — Ht 60.0 in | Wt 115.0 lb

## 2018-10-04 DIAGNOSIS — A4901 Methicillin susceptible Staphylococcus aureus infection, unspecified site: Secondary | ICD-10-CM

## 2018-10-04 DIAGNOSIS — L089 Local infection of the skin and subcutaneous tissue, unspecified: Secondary | ICD-10-CM

## 2018-10-04 DIAGNOSIS — M205X1 Other deformities of toe(s) (acquired), right foot: Secondary | ICD-10-CM

## 2018-10-04 MED ORDER — MUPIROCIN 2 % EX OINT: 1.0000 "application " | TOPICAL_OINTMENT | Freq: Every day | CUTANEOUS | 0 refills | Status: DC

## 2018-10-04 NOTE — Progress Notes (Signed)
Office Visit Note   Patient: Danielle Clarke           Date of Birth: 06-Oct-1941           MRN: [id number] Visit Date: 10/04/2018              Requested by: Juluis Rainier, MD [address] [place], [place] [address] PCP: Juluis Rainier, MD  Chief Complaint  Patient presents with  . Right Foot - Routine Post Op    09/27/18 I&D foor MTP 2nd toe      HPI: The patient is a 77 yo female who is seen for post operative follow up following irrigation and debridement of her right foot second toe with excision of skin, soft tissue, tendon and bone on 09/27/2018. She had undergone claw toe surgery and developed local infection.  She is 7 days post op. She continues on Bactrim DS 1 BID. She has been non weight bearing as much as possible in a post op shoe. Operative cultures grew MSSA which was pan sensitive. She reports her blood sugars have been increasing due to her decreased activity level.   Assessment & Plan: Visit Diagnoses:  1. Toe infection   2. Acquired claw toe of right foot   3. MSSA (methicillin susceptible Staphylococcus aureus) infection     Plan: Continue Bactrim DS BID, start bactroban ointment to the incisional area daily. Will leave sutures in place. Continue non weight bearing as much as possible with post op shoe and cane. Elevate the right foot as much as possible.Protein supplement at least once daily and discussed using one with low carbs.   Follow up next Tuesday.   Follow-Up Instructions: Return in about 4 days (around 10/08/2018).   Ortho Exam  Patient is alert, oriented, no adenopathy, well-dressed, normal affect, normal respiratory effort. Right second toe dorsal incision is intact. Moderate erythema and edema localized to the area. No erythema proximally or over the plantar surface. Good dorsalis pedis pulse. Sutures left in place.   Imaging: No results found. No images are attached to the encounter.  Labs: Lab Results  Component Value Date     HGBA1C 7.9 (H) 01/04/2017   REPTSTATUS 10/02/2018 FINAL 09/27/2018   GRAMSTAIN  09/27/2018    FEW WBC PRESENT, PREDOMINANTLY PMN NO ORGANISMS SEEN    CULT  09/27/2018    RARE STAPHYLOCOCCUS AUREUS NO ANAEROBES ISOLATED Performed at [facility], [address] [place], [place] [address]    [facility] STAPHYLOCOCCUS AUREUS 09/27/2018     Lab Results  Component Value Date   ALBUMIN 3.4 (L) 01/05/2017   ALBUMIN 3.7 01/04/2017   ALBUMIN 3.9 10/07/2014    Body mass index is 22.46 kg/m.  Orders:  No orders of the defined types were placed in this encounter.  Meds ordered this encounter  Medications  . mupirocin ointment (BACTROBAN) 2 %    Sig: Apply 1 application topically daily. Apply to right foot incision daily    Dispense:  22 g    Refill:  0     Procedures: No procedures performed  Clinical Data: No additional findings.  ROS:  All other systems negative, except as noted in the HPI. Review of Systems  Objective: Vital Signs: Ht 5' (1.524 m)   Wt 115 lb (52.2 kg)   BMI 22.46 kg/m   Specialty Comments:  No specialty comments available.  PMFS History: Patient Active Problem List   Diagnosis Date Noted  . Cutaneous abscess of right foot   .  Elevated troponin   . Abnormal EKG   . Bronchitis 08/05/2017  . Bilateral carotid artery stenosis   . Aortic stenosis 08/25/2015  . Cervical stenosis of spinal canal 07/31/2014  . CKD (chronic kidney disease) stage 2, GFR 60-89 ml/min 12/30/2013  . Essential hypertension, benign 12/30/2013  . Hyperlipidemia 12/30/2013  . Toe infection 12/30/2013  . Anemia 12/30/2013  . Acute renal failure (HCC) 12/29/2013  . Syncope 01/02/2013  . Diabetes mellitus type 1 (HCC) 01/02/2013  . Bilateral carotid artery disease (HCC) 01/02/2013  . GOITER, MULTINODULAR 02/11/2008   Past Medical History:  Diagnosis Date  . Arthritis   . Asthmatic bronchitis    with colds per patient  . Cataract   . Glaucoma   . Heart  murmur    Dr Katrinka Blazing is her  cardiologist.   . Hyperlipemia   . Hypertension    Dr. Katrinka Blazing ~ 2 years ago  . Neck fracture [facility])    july 2013  . Osteoporosis   . Type 1 diabetes mellitus (HCC)     Family History  Problem Relation Age of Onset  . Cancer Mother   . Heart Problems Father     Past Surgical History:  Procedure Laterality Date  . ANKLE FRACTURE SURGERY Left 2001   steel plate and 3 screws   . ANTERIOR CERVICAL CORPECTOMY N/A 07/31/2014   Procedure: Cervical Four to Cervical Six Corpectomy;  Surgeon: Karn Cassis, MD;  Location: [facility];  Service: Neurosurgery;  Laterality: N/A;  C4 to C6 Corpectomy  . BREAST SURGERY  1988  . CATARACT EXTRACTION W/ INTRAOCULAR LENS  IMPLANT, BILATERAL Bilateral 2011   right and then left  . EYE SURGERY    . FRACTURE SURGERY    . HAMMER TOE SURGERY  1998  . I&D EXTREMITY Right 09/27/2018   Procedure: IRRIGATION AND DEBRIDEMENT RIGHT FOOT;  Surgeon: Nadara Mustard, MD;  Location: [facility];  Service: Orthopedics;  Laterality: Right;  . JOINT REPLACEMENT    . TONSILLECTOMY AND ADENOIDECTOMY  1948  . TOTAL SHOULDER REPLACEMENT  2010   right shoulder   . TUBAL LIGATION  1979  . TYMPANOSTOMY TUBE PLACEMENT Bilateral    Social History   Occupational History  . Occupation: Retired    Associate Professor: RETIRED  Tobacco Use  . Smoking status: Never Smoker  . Smokeless tobacco: Never Used  Substance and Sexual Activity  . Alcohol use: Yes    Alcohol/week: 7.0 standard drinks    Types: 7 Glasses of wine per week  . Drug use: Not on file  . Sexual activity: Never

## 2018-10-04 NOTE — Telephone Encounter (Signed)
Patient called wanting to clarify on wound care orders today from visit with Shawn.  Advised per office note.  Patient verbalized understanding.  -Lauren

## 2018-10-08 ENCOUNTER — Encounter (INDEPENDENT_AMBULATORY_CARE_PROVIDER_SITE_OTHER): Payer: Self-pay | Admitting: Physician Assistant

## 2018-10-08 ENCOUNTER — Ambulatory Visit (INDEPENDENT_AMBULATORY_CARE_PROVIDER_SITE_OTHER): Payer: Medicare Other | Admitting: Physician Assistant

## 2018-10-08 VITALS — Ht 60.0 in | Wt 115.0 lb

## 2018-10-08 DIAGNOSIS — M205X1 Other deformities of toe(s) (acquired), right foot: Secondary | ICD-10-CM

## 2018-10-08 DIAGNOSIS — A4901 Methicillin susceptible Staphylococcus aureus infection, unspecified site: Secondary | ICD-10-CM

## 2018-10-08 MED ORDER — CLINDAMYCIN HCL 300 MG PO CAPS: 300.0000 mg | ORAL_CAPSULE | Freq: Three times a day (TID) | ORAL | 0 refills | Status: DC

## 2018-10-09 ENCOUNTER — Encounter (INDEPENDENT_AMBULATORY_CARE_PROVIDER_SITE_OTHER): Payer: Self-pay | Admitting: Physician Assistant

## 2018-10-09 ENCOUNTER — Telehealth (INDEPENDENT_AMBULATORY_CARE_PROVIDER_SITE_OTHER): Payer: Self-pay | Admitting: Physician Assistant

## 2018-10-09 MED ORDER — ONDANSETRON HCL 4 MG PO TABS: 4.0000 mg | ORAL_TABLET | Freq: Three times a day (TID) | ORAL | 0 refills | Status: DC | PRN

## 2018-10-09 NOTE — Telephone Encounter (Signed)
Spoke to PA [facility](Shawn) about pt concerns and she advised me that she will [facility] in [facility]. Pt will be called to be notified of Rx.

## 2018-10-09 NOTE — Progress Notes (Signed)
Office Visit Note   Patient: Danielle Clarke           Date of Birth: [birth year]           MRN: [id number] Visit Date: 10/08/2018              Requested by: Juluis RainierBarnes, [name], MD [address] [place], [place] [address] PCP: Juluis RainierBarnes, [name], MD  Chief Complaint  Patient presents with  . Right Foot - Routine Post Op    09/27/18 right foot I&D MTP 2nd toe       HPI: The patient is a 77 yo female who is seen for post operative follow up following irrigation and debridement of her right foot second toe with excision of skin, soft tissue and tendon and bone on 09/27/2018. She had undergone claw toe surgery and developed local infection.  Cultures from the OR were MSSA which was pan sensitive. She has been on Bactrim DS, but on her visit today, reports a lot of nausea and she feels it is coming from the Bactrim. She did try taking it with food, but is still very nauseated. We are going to stop the Bactrim and switch to Clindamycin as she is tetracycline allergic.    Assessment & Plan: Diagnoses:  1. Acquired claw toe of right foot   2. MSSA (methicillin susceptible Staphylococcus aureus) infection     Plan: Bactrim discontinued and patient instructed to begin Clindamycin tomorrow. Counseled to continue to stay off the right foot as much as possible and elevate as much as possible. Follow up this Friday or sooner if concerns. Will leave sutures intact until later this week or next week.   Follow-Up Instructions: Return in about 3 days (around 10/11/2018).   Ortho Exam  Patient is alert, oriented, no adenopathy, well-dressed, normal affect, normal respiratory effort. Right foot second toe incision has some edema and erythema over the area. Sutures intact.   Imaging: No results found.   Labs: Lab Results  Component Value Date   HGBA1C 7.9 (H) 01/04/2017   REPTSTATUS 10/02/2018 FINAL 09/27/2018   GRAMSTAIN  09/27/2018    FEW WBC PRESENT, PREDOMINANTLY PMN NO ORGANISMS  SEEN    CULT  09/27/2018    RARE STAPHYLOCOCCUS AUREUS NO ANAEROBES ISOLATED Performed at [facility], [address] [place], [place] [address]    [facility] STAPHY[facility] 09/27/2018     Lab Results  Component Value Date   ALBUMIN 3.4 (L) 01/05/2017   ALBUMIN 3.7 01/04/2017   ALBUMIN 3.9 10/07/2014    Body mass index is 22.46 kg/m.  Orders:  No orders of the defined types were placed in this encounter.  Meds ordered this encounter  Medications  . clindamycin (CLEOCIN) 300 MG capsule    Sig: Take 1 capsule (300 mg total) by mouth 3 (three) times daily.    Dispense:  42 capsule    Refill:  0  . ondansetron (ZOFRAN) 4 MG tablet    Sig: Take 1 tablet (4 mg total) by mouth every 8 (eight) hours as needed for nausea or vomiting.    Dispense:  20 tablet    Refill:  0     Procedures: No procedures performed  Clinical Data: No additional findings.  ROS:  All other systems negative, except as noted in the HPI. Review of Systems  Objective: Vital Signs: Ht 5' (1.524 m)   Wt 115 lb (52.2 kg)   BMI 22.46 kg/m   Specialty Comments:  No specialty  comments available.  PMFS History: Patient Active Problem List   Diagnosis Date Noted  . Cutaneous abscess of right foot   . Elevated troponin   . Abnormal EKG   . Bronchitis 08/05/2017  . Bilateral carotid artery stenosis   . Aortic stenosis 08/25/2015  . Cervical stenosis of spinal canal 07/31/2014  . CKD (chronic kidney disease) stage 2, GFR 60-89 ml/min 12/30/2013  . Essential hypertension, benign 12/30/2013  . Hyperlipidemia 12/30/2013  . Toe infection 12/30/2013  . Anemia 12/30/2013  . Acute renal failure (HCC) 12/29/2013  . Syncope 01/02/2013  . Diabetes mellitus type 1 (HCC) 01/02/2013  . Bilateral carotid artery disease (HCC) 01/02/2013  . GOITER, MULTINODULAR 02/11/2008   Past Medical History:  Diagnosis Date  . Arthritis   . Asthmatic bronchitis    with colds per patient  . Cataract    . Glaucoma   . Heart murmur    Dr Katrinka Blazing is her  cardiologist.   . Hyperlipemia   . Hypertension    Dr. Katrinka Blazing ~ 2 years ago  . Neck fracture [facility])    july 2013  . Osteoporosis   . Type 1 diabetes mellitus (HCC)     Family History  Problem Relation Age of Onset  . Cancer Mother   . Heart Problems Father     Past Surgical History:  Procedure Laterality Date  . ANKLE FRACTURE SURGERY Left 2001   steel plate and 3 screws   . ANTERIOR CERVICAL CORPECTOMY N/A 07/31/2014   Procedure: Cervical Four to Cervical Six Corpectomy;  Surgeon: Karn Cassis, MD;  Location: [facility];  Service: Neurosurgery;  Laterality: N/A;  C4 to C6 Corpectomy  . BREAST SURGERY  1988  . CATARACT EXTRACTION W/ INTRAOCULAR LENS  IMPLANT, BILATERAL Bilateral 2011   right and then left  . EYE SURGERY    . FRACTURE SURGERY    . HAMMER TOE SURGERY  1998  . I&D EXTREMITY Right 09/27/2018   Procedure: IRRIGATION AND DEBRIDEMENT RIGHT FOOT;  Surgeon: Nadara Mustard, MD;  Location: [facility] OR;  Service: Orthopedics;  Laterality: Right;  . JOINT REPLACEMENT    . TONSILLECTOMY AND ADENOIDECTOMY  1948  . TOTAL SHOULDER REPLACEMENT  2010   right shoulder   . TUBAL LIGATION  1979  . TYMPANOSTOMY TUBE PLACEMENT Bilateral    Social History   Occupational History  . Occupation: Retired    Associate Professor: RETIRED  Tobacco Use  . Smoking status: Never Smoker  . Smokeless tobacco: Never Used  Substance and Sexual Activity  . Alcohol use: Yes    Alcohol/week: 7.0 standard drinks    Types: 7 Glasses of wine per week  . Drug use: Not on file  . Sexual activity: Never

## 2018-10-09 NOTE — Telephone Encounter (Signed)
Patient called advised she is feeling so sick on her stomach this morning and the food she has eaten will not stay down. Patient said she have not been able to take the antibiotic yet. Patient said she is suppose to take the antibiotic 3 times a day. The number to contact patient is [phone]

## 2018-10-11 ENCOUNTER — Encounter (INDEPENDENT_AMBULATORY_CARE_PROVIDER_SITE_OTHER): Payer: Self-pay | Admitting: Physician Assistant

## 2018-10-11 ENCOUNTER — Ambulatory Visit (INDEPENDENT_AMBULATORY_CARE_PROVIDER_SITE_OTHER): Payer: Medicare Other | Admitting: Physician Assistant

## 2018-10-11 DIAGNOSIS — L089 Local infection of the skin and subcutaneous tissue, unspecified: Secondary | ICD-10-CM

## 2018-10-11 DIAGNOSIS — M205X1 Other deformities of toe(s) (acquired), right foot: Secondary | ICD-10-CM

## 2018-10-11 DIAGNOSIS — A4901 Methicillin susceptible Staphylococcus aureus infection, unspecified site: Secondary | ICD-10-CM

## 2018-10-11 NOTE — Progress Notes (Signed)
Office Visit Note   Patient: Danielle Clarke           Date of Birth: 08/31/1941           MRN: [id number] Visit Date: 10/11/2018              Requested by: Juluis Rainier, MD [address] [place], [place] [address] PCP: Juluis Rainier, MD  Chief Complaint  Patient presents with  . Right Foot - Routine Post Op      HPI: The patient is a 77 year old female who seen for follow-up following irrigation and debridement of her right foot second toe with excision of skin, soft tissue and tendon and bone on 09/27/2018.  She had undergone claw toe surgery and developed a local infection.  Cultures from the operating room for methicillin sensitive staph aureus which was pansensitive.  She developed nausea and vomiting while on Bactrim and we discontinued that last visit.  Her nausea did persist for couple more days but overall is improved following the initiation of some Zofran.  We did switch her to clindamycin and she has started on this and reports her nausea is significantly better overall but she is taking the Zofran as well.  She is using some Bactroban ointment to the incisional area as well and offloading the area and walking with a postop shoe.  She reports the pain in her foot is better but she still feels like things are swollen and her toe is lifted.  She is also concerned that she needs to have some dental surgery done next week and is concerned about the ongoing need for antibiotics.  She is further concerned that she has now been diagnosed with severe osteoporosis and her internist has recommended beginning injections for this as well.  We will discuss this further with Dr. Lajoyce Corners upon his return and make any changes to her regimen as directed.  Assessment & Plan: Visit Diagnoses:  1. Acquired claw toe of right foot   2. MSSA (methicillin susceptible Staphylococcus aureus) infection   3. Toe infection     Plan: She is going to continue the clindamycin 300 mg 3 times  daily and Bactroban ointment to the incisional area.  She still has Zofran as needed for nausea and reports that this is better.  Will discuss with Dr. Lajoyce Corners on his return Monday of whether or not she can have her upcoming dental surgery given the problems with the infection in her foot.  We will also need to discuss starting a bisphosphonate for her osteoporosis which is apparently been recommended by her primary care physician.  Follow-Up Instructions: Return in about 4 days (around 10/15/2018).   Ortho Exam  Patient is alert, oriented, no adenopathy, well-dressed, normal affect, normal respiratory effort. The erythema and edema about the right second toe incision are slightly better than earlier this week but still persists.  There is no gross drainage, she does have Bactroban ointment over the incisional area and the sutures remain intact.  Imaging: No results found.   Labs: Lab Results  Component Value Date   HGBA1C 7.9 (H) 01/04/2017   REPTSTATUS 10/02/2018 FINAL 09/27/2018   GRAMSTAIN  09/27/2018    FEW WBC PRESENT, PREDOMINANTLY PMN NO ORGANISMS SEEN    CULT  09/27/2018    RARE STAPHYLOCOCCUS AUREUS NO ANAEROBES ISOLATED Performed at [facility], [address], [place], [place] [address]    [facility] STAPHYLOCOCCUS AUREUS 09/27/2018     Lab Results  Component  Value Date   ALBUMIN 3.4 (L) 01/05/2017   ALBUMIN 3.7 01/04/2017   ALBUMIN 3.9 10/07/2014    There is no height or weight on file to calculate BMI.  Orders:  No orders of the defined types were placed in this encounter.  No orders of the defined types were placed in this encounter.    Procedures: No procedures performed  Clinical Data: No additional findings.  ROS:  All other systems negative, except as noted in the HPI. Review of Systems  Objective: Vital Signs: There were no vitals taken for this visit.  Specialty Comments:  No specialty comments available.  PMFS History: Patient  Active Problem List   Diagnosis Date Noted  . Cutaneous abscess of right foot   . Elevated troponin   . Abnormal EKG   . Bronchitis 08/05/2017  . Bilateral carotid artery stenosis   . Aortic stenosis 08/25/2015  . Cervical stenosis of spinal canal 07/31/2014  . CKD (chronic kidney disease) stage 2, GFR 60-89 ml/min 12/30/2013  . Essential hypertension, benign 12/30/2013  . Hyperlipidemia 12/30/2013  . Toe infection 12/30/2013  . Anemia 12/30/2013  . Acute renal failure (HCC) 12/29/2013  . Syncope 01/02/2013  . Diabetes mellitus type 1 (HCC) 01/02/2013  . Bilateral carotid artery disease (HCC) 01/02/2013  . GOITER, MULTINODULAR 02/11/2008   Past Medical History:  Diagnosis Date  . Arthritis   . Asthmatic bronchitis    with colds per patient  . Cataract   . Glaucoma   . Heart murmur    Dr [name] is her  cardiologist.   . Hyperlipemia   . Hypertension    Dr. [name] ~ 2 years ago  . Neck fracture [facility])    july 2013  . Osteoporosis   . Type 1 diabetes mellitus (HCC)     Family History  Problem Relation Age of Onset  . Cancer Mother   . Heart Problems Father     Past Surgical History:  Procedure Laterality Date  . ANKLE FRACTURE SURGERY Left 2001   steel plate and 3 screws   . ANTERIOR CERVICAL CORPECTOMY N/A 07/31/2014   Procedure: Cervical Four to Cervical Six Corpectomy;  Surgeon: Karn CassisErnesto M Botero, MD;  Location: [facility];  Service: Neurosurgery;  Laterality: N/A;  C4 to C6 Corpectomy  . BREAST SURGERY  1988  . CATARACT EXTRACTION W/ INTRAOCULAR LENS  IMPLANT, BILATERAL Bilateral 2011   right and then left  . EYE SURGERY    . FRACTURE SURGERY    . HAMMER TOE SURGERY  1998  . I&D EXTREMITY Right 09/27/2018   Procedure: IRRIGATION AND DEBRIDEMENT RIGHT FOOT;  Surgeon: Nadara Mustarduda, Marcus V, MD;  [facility];  Service: Orthopedics;  Laterality: Right;  . JOINT REPLACEMENT    . TONSILLECTOMY AND ADENOIDECTOMY  1948  . TOTAL SHOULDER REPLACEMENT  2010   right shoulder     . TUBAL LIGATION  1979  . TYMPANOSTOMY TUBE PLACEMENT Bilateral    Social History   Occupational History  . Occupation: Retired    Associate Professormployer: RETIRED  Tobacco Use  . Smoking status: Never Smoker  . Smokeless tobacco: Never Used  Substance and Sexual Activity  . Alcohol use: Yes    Alcohol/week: 7.0 standard drinks    Types: 7 Glasses of wine per week  . Drug use: Not on file  . Sexual activity: Never

## 2018-10-15 ENCOUNTER — Encounter (INDEPENDENT_AMBULATORY_CARE_PROVIDER_SITE_OTHER): Payer: Self-pay | Admitting: Physician Assistant

## 2018-10-15 ENCOUNTER — Ambulatory Visit (INDEPENDENT_AMBULATORY_CARE_PROVIDER_SITE_OTHER): Payer: Medicare Other | Admitting: Orthopedic Surgery

## 2018-10-15 VITALS — Ht 60.0 in | Wt 115.0 lb

## 2018-10-15 DIAGNOSIS — M205X1 Other deformities of toe(s) (acquired), right foot: Secondary | ICD-10-CM

## 2018-10-17 ENCOUNTER — Encounter (INDEPENDENT_AMBULATORY_CARE_PROVIDER_SITE_OTHER): Payer: Self-pay | Admitting: Orthopedic Surgery

## 2018-10-17 NOTE — Progress Notes (Signed)
Office Visit Note   Patient: Danielle FordyceJacqueline J Clarke           Date of Birth: December 25, 1940           MRN: [id number] Visit Date: 10/15/2018              Requested by: Juluis RainierBarnes, [name], MD [address] [place], [place] [address] PCP: Juluis RainierBarnes, [name], MD  Chief Complaint  Patient presents with  . Right Foot - Routine Post Op      HPI: Patient presents in follow-up for irrigation and debridement of infection right second toe.  Cultures were positive for methicillin sensitive staph that was pansensitive for antibiotics and patient is currently on clindamycin and Bactroban.  Assessment & Plan: Visit Diagnoses:  1. Acquired claw toe of right foot     Plan: Recommended elevation compression scar massage a silicone sleeve was provided for the toe to help decrease swelling and a mouse pad to decrease clawing.  Also recommended probiotics to help with normal GI flora secondary to her use of antibiotics.  Follow-Up Instructions: Return in about 2 weeks (around 10/29/2018).   Ortho Exam  Patient is alert, oriented, no adenopathy, well-dressed, normal affect, normal respiratory effort. Examination patient does have redness and swelling around the second toe the incision is well-healed.  With elevation and compression the redness completely and there is no tenderness to palpation there is no clinical signs of infection the redness seems to be more from swelling with dependence.  Imaging: No results found. No images are attached to the encounter.  Labs: Lab Results  Component Value Date   HGBA1C 7.9 (H) 01/04/2017   REPTSTATUS 10/02/2018 FINAL 09/27/2018   GRAMSTAIN  09/27/2018    FEW WBC PRESENT, PREDOMINANTLY PMN NO ORGANISMS SEEN    CULT  09/27/2018    RARE STAPHYLOCOCCUS AUREUS NO ANAEROBES ISOLATED Performed at [facility], [address] [place], [place] [address]    [facility] 09/27/2018     Lab Results  Component Value Date   ALBUMIN 3.4  (L) 01/05/2017   ALBUMIN 3.7 01/04/2017   ALBUMIN 3.9 10/07/2014    Body mass index is 22.46 kg/m.  Orders:  No orders of the defined types were placed in this encounter.  No orders of the defined types were placed in this encounter.    Procedures: No procedures performed  Clinical Data: No additional findings.  ROS:  All other systems negative, except as noted in the HPI. Review of Systems  Objective: Vital Signs: Ht 5' (1.524 m)   Wt 115 lb (52.2 kg)   BMI 22.46 kg/m   Specialty Comments:  No specialty comments available.  PMFS History: Patient Active Problem List   Diagnosis Date Noted  . Cutaneous abscess of right foot   . Elevated troponin   . Abnormal EKG   . Bronchitis 08/05/2017  . Bilateral carotid artery stenosis   . Aortic stenosis 08/25/2015  . Cervical stenosis of spinal canal 07/31/2014  . CKD (chronic kidney disease) stage 2, GFR 60-89 ml/min 12/30/2013  . Essential hypertension, benign 12/30/2013  . Hyperlipidemia 12/30/2013  . Toe infection 12/30/2013  . Anemia 12/30/2013  . Acute renal failure (HCC) 12/29/2013  . Syncope 01/02/2013  . Diabetes mellitus type 1 (HCC) 01/02/2013  . Bilateral carotid artery disease (HCC) 01/02/2013  . GOITER, MULTINODULAR 02/11/2008   Past Medical History:  Diagnosis Date  . Arthritis   . Asthmatic bronchitis    with colds per patient  .  Cataract   . Glaucoma   . Heart murmur    Dr Katrinka Blazing is her  cardiologist.   . Hyperlipemia   . Hypertension    Dr. Katrinka Blazing ~ 2 years ago  . Neck fracture [facility])    july 2013  . Osteoporosis   . Type 1 diabetes mellitus (HCC)     Family History  Problem Relation Age of Onset  . Cancer Mother   . Heart Problems Father     Past Surgical History:  Procedure Laterality Date  . ANKLE FRACTURE SURGERY Left 2001   steel plate and 3 screws   . ANTERIOR CERVICAL CORPECTOMY N/A 07/31/2014   Procedure: Cervical Four to Cervical Six Corpectomy;  Surgeon: Karn Cassis,  MD;  Location: [facility];  Service: Neurosurgery;  Laterality: N/A;  C4 to C6 Corpectomy  . BREAST SURGERY  1988  . CATARACT EXTRACTION W/ INTRAOCULAR LENS  IMPLANT, BILATERAL Bilateral 2011   right and then left  . EYE SURGERY    . FRACTURE SURGERY    . HAMMER TOE SURGERY  1998  . I&D EXTREMITY Right 09/27/2018   Procedure: IRRIGATION AND DEBRIDEMENT RIGHT FOOT;  Surgeon: Nadara Mustard, MD;  Location: [facility] OR;  Service: Orthopedics;  Laterality: Right;  . JOINT REPLACEMENT    . TONSILLECTOMY AND ADENOIDECTOMY  1948  . TOTAL SHOULDER REPLACEMENT  2010   right shoulder   . TUBAL LIGATION  1979  . TYMPANOSTOMY TUBE PLACEMENT Bilateral    Social History   Occupational History  . Occupation: Retired    Associate Professor: RETIRED  Tobacco Use  . Smoking status: Never Smoker  . Smokeless tobacco: Never Used  Substance and Sexual Activity  . Alcohol use: Yes    Alcohol/week: 7.0 standard drinks    Types: 7 Glasses of wine per week  . Drug use: Not on file  . Sexual activity: Never

## 2018-10-22 ENCOUNTER — Ambulatory Visit (INDEPENDENT_AMBULATORY_CARE_PROVIDER_SITE_OTHER): Payer: Medicare Other | Admitting: Orthopedic Surgery

## 2018-10-22 ENCOUNTER — Encounter (INDEPENDENT_AMBULATORY_CARE_PROVIDER_SITE_OTHER): Payer: Self-pay | Admitting: Physician Assistant

## 2018-10-22 VITALS — Ht 60.0 in | Wt 115.0 lb

## 2018-10-22 DIAGNOSIS — M205X1 Other deformities of toe(s) (acquired), right foot: Secondary | ICD-10-CM

## 2018-10-22 NOTE — Progress Notes (Signed)
Office Visit Note   Patient: Danielle Clarke           Date of Birth: [birth year]           MRN: [id number] Visit Date: 10/22/2018              Requested by: Juluis RainierBarnes, [name], MD [address] [place], [place] [address] PCP: Juluis RainierBarnes, [name], MD  Chief Complaint  Patient presents with  . Right Foot - Follow-up      HPI: Patient is a 77 year old woman who presents for follow-up status post debridement infection right foot sub-status post claw toe surgery she has been wearing a silicone sleeve and a mouse pad and has been doing stretching.  She states the redness and swelling has resolved significantly.  Assessment & Plan: Visit Diagnoses:  1. Acquired claw toe of right foot     Plan: Patient will continue with the silicone sleeve continue with the mouse pad she will use regular shoewear at this time complete her clindamycin in 2 days continue with her probiotics continue with stretching.  Follow-Up Instructions: Return in about 6 weeks (around 12/03/2018).   Ortho Exam  Patient is alert, oriented, no adenopathy, well-dressed, normal affect, normal respiratory effort. Examination there is no cellulitis there is significant decreased swelling in her toe the incision is well-healed there is no drainage her toe has less extension and is almost straight with the other toes.  Imaging: No results found. No images are attached to the encounter.  Labs: Lab Results  Component Value Date   HGBA1C 7.9 (H) 01/04/2017   REPTSTATUS 10/02/2018 FINAL 09/27/2018   GRAMSTAIN  09/27/2018    FEW WBC PRESENT, PREDOMINANTLY PMN NO ORGANISMS SEEN    CULT  09/27/2018    RARE STAPHYLOCOCCUS AUREUS NO ANAEROBES ISOLATED Performed at [facility], [address] [place], [place] [address]    [facility] 09/27/2018     Lab Results  Component Value Date   ALBUMIN 3.4 (L) 01/05/2017   ALBUMIN 3.7 01/04/2017   ALBUMIN 3.9 10/07/2014    Body mass index is  22.46 kg/m.  Orders:  No orders of the defined types were placed in this encounter.  No orders of the defined types were placed in this encounter.    Procedures: No procedures performed  Clinical Data: No additional findings.  ROS:  All other systems negative, except as noted in the HPI. Review of Systems  Objective: Vital Signs: Ht 5' (1.524 m)   Wt 115 lb (52.2 kg)   BMI 22.46 kg/m   Specialty Comments:  No specialty comments available.  PMFS History: Patient Active Problem List   Diagnosis Date Noted  . Cutaneous abscess of right foot   . Elevated troponin   . Abnormal EKG   . Bronchitis 08/05/2017  . Bilateral carotid artery stenosis   . Aortic stenosis 08/25/2015  . Cervical stenosis of spinal canal 07/31/2014  . CKD (chronic kidney disease) stage 2, GFR 60-89 ml/min 12/30/2013  . Essential hypertension, benign 12/30/2013  . Hyperlipidemia 12/30/2013  . Toe infection 12/30/2013  . Anemia 12/30/2013  . Acute renal failure (HCC) 12/29/2013  . Syncope 01/02/2013  . Diabetes mellitus type 1 (HCC) 01/02/2013  . Bilateral carotid artery disease (HCC) 01/02/2013  . GOITER, MULTINODULAR 02/11/2008   Past Medical History:  Diagnosis Date  . Arthritis   . Asthmatic bronchitis    with colds per patient  . Cataract   . Glaucoma   . Heart  murmur    Dr Katrinka Blazing is her  cardiologist.   . Hyperlipemia   . Hypertension    Dr. Katrinka Blazing ~ 2 years ago  . Neck fracture [facility])    july 2013  . Osteoporosis   . Type 1 diabetes mellitus (HCC)     Family History  Problem Relation Age of Onset  . Cancer Mother   . Heart Problems Father     Past Surgical History:  Procedure Laterality Date  . ANKLE FRACTURE SURGERY Left 2001   steel plate and 3 screws   . ANTERIOR CERVICAL CORPECTOMY N/A 07/31/2014   Procedure: Cervical Four to Cervical Six Corpectomy;  Surgeon: Karn Cassis, MD;  Location: [facility];  Service: Neurosurgery;  Laterality: N/A;  C4 to C6 Corpectomy    . BREAST SURGERY  1988  . CATARACT EXTRACTION W/ INTRAOCULAR LENS  IMPLANT, BILATERAL Bilateral 2011   right and then left  . EYE SURGERY    . FRACTURE SURGERY    . HAMMER TOE SURGERY  1998  . I&D EXTREMITY Right 09/27/2018   Procedure: IRRIGATION AND DEBRIDEMENT RIGHT FOOT;  Surgeon: Nadara Mustard, MD;  [facility];  Service: Orthopedics;  Laterality: Right;  . JOINT REPLACEMENT    . TONSILLECTOMY AND ADENOIDECTOMY  1948  . TOTAL SHOULDER REPLACEMENT  2010   right shoulder   . TUBAL LIGATION  1979  . TYMPANOSTOMY TUBE PLACEMENT Bilateral    Social History   Occupational History  . Occupation: Retired    Associate Professor: RETIRED  Tobacco Use  . Smoking status: Never Smoker  . Smokeless tobacco: Never Used  Substance and Sexual Activity  . Alcohol use: Yes    Alcohol/week: 7.0 standard drinks    Types: 7 Glasses of wine per week  . Drug use: Not on file  . Sexual activity: Never

## 2018-12-03 ENCOUNTER — Ambulatory Visit (INDEPENDENT_AMBULATORY_CARE_PROVIDER_SITE_OTHER): Payer: Medicare Other | Admitting: Orthopedic Surgery

## 2018-12-03 ENCOUNTER — Encounter (INDEPENDENT_AMBULATORY_CARE_PROVIDER_SITE_OTHER): Payer: Self-pay | Admitting: Orthopedic Surgery

## 2018-12-03 VITALS — Ht 60.0 in | Wt 115.0 lb

## 2018-12-03 DIAGNOSIS — M205X1 Other deformities of toe(s) (acquired), right foot: Secondary | ICD-10-CM

## 2018-12-03 NOTE — Progress Notes (Signed)
Office Visit Note   Patient: Danielle Clarke           Date of Birth: 09/20/1941           MRN: [id number] Visit Date: 12/03/2018              Requested by: Juluis Rainier, MD [address] [place], [place] [address] PCP: Juluis Rainier, MD  Chief Complaint  Patient presents with  . Right Foot - Routine Post Op      HPI: Patient is a 78 year old woman who presents in follow-up status post claw toe surgery right foot second toe.  Patient states that the dermatitis is improving the swelling is decreasing.  She states she still has to use the silicone pad in the malls and had to keep the scar tissue from dorsiflexing her toe.  Assessment & Plan: Visit Diagnoses:  1. Acquired claw toe of right foot     Plan: Patient was given to new mouse pad and has a silicone sleeve she will continue with her current care she is making excellent progress.  Follow-Up Instructions: Return in about 4 weeks (around 12/31/2018).   Ortho Exam  Patient is alert, oriented, no adenopathy, well-dressed, normal affect, normal respiratory effort. Examination there is decreased swelling decreased dermatitis she does have scar tissue dorsally.  This is decreasing.  There is no redness no cellulitis no drainage no signs of infection.  Patient states her foot foot and toe are feeling much better.  Imaging: No results found. No images are attached to the encounter.  Labs: Lab Results  Component Value Date   HGBA1C 7.9 (H) 01/04/2017   REPTSTATUS 10/02/2018 FINAL 09/27/2018   GRAMSTAIN  09/27/2018    FEW WBC PRESENT, PREDOMINANTLY PMN NO ORGANISMS SEEN    CULT  09/27/2018    RARE STAPHYLOCOCCUS AUREUS NO ANAEROBES ISOLATED Performed at [facility], [address], [place], [place] [address]    [facility] STAPHYLOCOCCUS AUREUS 09/27/2018     Lab Results  Component Value Date   ALBUMIN 3.4 (L) 01/05/2017   ALBUMIN 3.7 01/04/2017   ALBUMIN 3.9 10/07/2014    Body mass index is  22.46 kg/m.  Orders:  No orders of the defined types were placed in this encounter.  No orders of the defined types were placed in this encounter.    Procedures: No procedures performed  Clinical Data: No additional findings.  ROS:  All other systems negative, except as noted in the HPI. Review of Systems  Objective: Vital Signs: Ht 5' (1.524 m)   Wt 115 lb (52.2 kg)   BMI 22.46 kg/m   Specialty Comments:  No specialty comments available.  PMFS History: Patient Active Problem List   Diagnosis Date Noted  . Cutaneous abscess of right foot   . Elevated troponin   . Abnormal EKG   . Bronchitis 08/05/2017  . Bilateral carotid artery stenosis   . Aortic stenosis 08/25/2015  . Cervical stenosis of spinal canal 07/31/2014  . CKD (chronic kidney disease) stage 2, GFR 60-89 ml/min 12/30/2013  . Essential hypertension, benign 12/30/2013  . Hyperlipidemia 12/30/2013  . Toe infection 12/30/2013  . Anemia 12/30/2013  . Acute renal failure (HCC) 12/29/2013  . Syncope 01/02/2013  . Diabetes mellitus type 1 (HCC) 01/02/2013  . Bilateral carotid artery disease (HCC) 01/02/2013  . GOITER, MULTINODULAR 02/11/2008   Past Medical History:  Diagnosis Date  . Arthritis   . Asthmatic bronchitis    with colds per patient  .  Cataract   . Glaucoma   . Heart murmur    Dr Katrinka Blazing is her  cardiologist.   . Hyperlipemia   . Hypertension    Dr. Katrinka Blazing ~ 2 years ago  . Neck fracture [facility])    july 2013  . Osteoporosis   . Type 1 diabetes mellitus (HCC)     Family History  Problem Relation Age of Onset  . Cancer Mother   . Heart Problems Father     Past Surgical History:  Procedure Laterality Date  . ANKLE FRACTURE SURGERY Left 2001   steel plate and 3 screws   . ANTERIOR CERVICAL CORPECTOMY N/A 07/31/2014   Procedure: Cervical Four to Cervical Six Corpectomy;  Surgeon: Karn Cassis, MD;  Location: [facility];  Service: Neurosurgery;  Laterality: N/A;  C4 to C6 Corpectomy    . BREAST SURGERY  1988  . CATARACT EXTRACTION W/ INTRAOCULAR LENS  IMPLANT, BILATERAL Bilateral 2011   right and then left  . EYE SURGERY    . FRACTURE SURGERY    . HAMMER TOE SURGERY  1998  . I&D EXTREMITY Right 09/27/2018   Procedure: IRRIGATION AND DEBRIDEMENT RIGHT FOOT;  Surgeon: Nadara Mustard, MD;  Location: [facility];  Service: Orthopedics;  Laterality: Right;  . JOINT REPLACEMENT    . TONSILLECTOMY AND ADENOIDECTOMY  1948  . TOTAL SHOULDER REPLACEMENT  2010   right shoulder   . TUBAL LIGATION  1979  . TYMPANOSTOMY TUBE PLACEMENT Bilateral    Social History   Occupational History  . Occupation: Retired    Associate Professor: RETIRED  Tobacco Use  . Smoking status: Never Smoker  . Smokeless tobacco: Never Used  Substance and Sexual Activity  . Alcohol use: Yes    Alcohol/week: 7.0 standard drinks    Types: 7 Glasses of wine per week  . Drug use: Not on file  . Sexual activity: Never

## 2018-12-21 ENCOUNTER — Ambulatory Visit
Admission: EM | Admit: 2018-12-21 | Discharge: 2018-12-21 | Disposition: A | Discharge status: Home / Self Care | Payer: Medicare Other | Attending: Family Medicine | Admitting: Family Medicine

## 2018-12-21 ENCOUNTER — Other Ambulatory Visit: Payer: Self-pay

## 2018-12-21 DIAGNOSIS — W5501XA Bitten by cat, initial encounter: Secondary | ICD-10-CM | POA: Diagnosis not present

## 2018-12-21 DIAGNOSIS — M79641 Pain in right hand: Secondary | ICD-10-CM

## 2018-12-21 MED ORDER — TRIAMCINOLONE ACETONIDE 0.025 % EX OINT: 1.0000 "application " | TOPICAL_OINTMENT | Freq: Two times a day (BID) | CUTANEOUS | 0 refills | Status: DC

## 2018-12-21 MED ORDER — ONDANSETRON 4 MG PO TBDP: 4.0000 mg | ORAL_TABLET | Freq: Three times a day (TID) | ORAL | 0 refills | Status: DC | PRN

## 2018-12-21 NOTE — ED Triage Notes (Signed)
Per pt she was bitten by her cat her Monday and seen by her PCP ands was placed on antibiotic. Pt was told to be seen if not getting better. There is redness and swelling to her hand still. Pt is saying there is shooting pain in her hand also.

## 2018-12-21 NOTE — Discharge Instructions (Addendum)
I think the redness is related to a lot of inflammation from the infection I would like for you to keep taking the antibiotics that you are prescribed You can take Zofran for nausea vomiting as needed I will give you a steroid cream for the inflammation in your hand You can also do ice to the area Follow up as needed for continued or worsening symptoms

## 2018-12-22 NOTE — ED Provider Notes (Signed)
[facility]    CSN: [id number] Arrival date & time: 12/21/18  1155     History   Chief Complaint Chief Complaint  Patient presents with  . Animal Bite    HPI Danielle Clarke is a 78 y.o. female.   Pt is a 78 year old female that presents with hand pain. This is from a cat bite to her right hand. She was seen for this and treated approximately 4 to 5 days ago. She was given 2 different antibiotics to treat.She has been taking the abx as prescribed. She reports they are causing her nausea.  She since has had decrease in the redness in her wrist and forearm. The redness is localized in the right hand. She is having sharp pains in the hand. She denies any fever, chills, myalgias. She has been taking tylenol for the pain and using cortisone cream which somewhat helps.   ROS per HPI    Animal Bite    Past Medical History:  Diagnosis Date  . Arthritis   . Asthmatic bronchitis    with colds per patient  . Cataract   . Glaucoma   . Heart murmur    Dr [name] is her  cardiologist.   . Hyperlipemia   . Hypertension    Dr. [name] ~ 2 years ago  . Neck fracture [facility](HCC)    july 2013  . Osteoporosis   . Type 1 diabetes mellitus [facility](HCC)     Patient Active Problem List   Diagnosis Date Noted  . Cutaneous abscess of right foot   . Elevated troponin   . Abnormal EKG   . Bronchitis 08/05/2017  . Bilateral carotid artery stenosis   . Aortic stenosis 08/25/2015  . Cervical stenosis of spinal canal 07/31/2014  . CKD (chronic kidney disease) stage 2, GFR 60-89 ml/min 12/30/2013  . Essential hypertension, benign 12/30/2013  . Hyperlipidemia 12/30/2013  . Toe infection 12/30/2013  . Anemia 12/30/2013  . Acute renal failure (HCC) 12/29/2013  . Syncope 01/02/2013  . Diabetes mellitus type 1 (HCC) 01/02/2013  . Bilateral carotid artery disease (HCC) 01/02/2013  . GOITER, MULTINODULAR 02/11/2008    Past Surgical History:  Procedure Laterality Date  . ANKLE FRACTURE  SURGERY Left 2001   steel plate and 3 screws   . ANTERIOR CERVICAL CORPECTOMY N/A 07/31/2014   Procedure: Cervical Four to Cervical Six Corpectomy;  Surgeon: Karn CassisErnesto M Botero, MD;  Location: [facility];  Service: Neurosurgery;  Laterality: N/A;  C4 to C6 Corpectomy  . BREAST SURGERY  1988  . CATARACT EXTRACTION W/ INTRAOCULAR LENS  IMPLANT, BILATERAL Bilateral 2011   right and then left  . EYE SURGERY    . FRACTURE SURGERY    . HAMMER TOE SURGERY  1998  . I&D EXTREMITY Right 09/27/2018   Procedure: IRRIGATION AND DEBRIDEMENT RIGHT FOOT;  Surgeon: Nadara Mustarduda, Marcus V, MD;  [facility];  Service: Orthopedics;  Laterality: Right;  . JOINT REPLACEMENT    . TONSILLECTOMY AND ADENOIDECTOMY  1948  . TOTAL SHOULDER REPLACEMENT  2010   right shoulder   . TUBAL LIGATION  1979  . TYMPANOSTOMY TUBE PLACEMENT Bilateral     OB History    Gravida      Para      Term      Preterm      AB      Living  2     SAB      TAB      Ectopic  Multiple      Live Births               Home Medications    Prior to Admission medications   Medication Sig Start Date End Date Taking? Authorizing Provider  acetaminophen (TYLENOL) 500 MG tablet Take 500 mg by mouth 4 (four) times daily.     [provider]  albuterol (PROVENTIL HFA;VENTOLIN HFA) 108 (90 Base) MCG/ACT inhaler Inhale 2 puffs into the lungs every 6 (six) hours as needed for wheezing or shortness of breath. 08/08/17   Regalado, Jon Billings A, MD  aspirin EC 81 MG tablet Take 81 mg by mouth daily.    [provider]  Chromium Picolinate 1000 MCG TABS Take 1,000 mcg by mouth daily at 12 noon.     [provider]  CINNAMON PO Take 1,000 mg by mouth 4 (four) times daily.    [provider]  clindamycin (CLEOCIN) 300 MG capsule Take 1 capsule (300 mg total) by mouth 3 (three) times daily. 10/08/18   Rayburn, Fanny Bien, PA-C  Coenzyme Q10 (COQ10) 200 MG CAPS Take 200 mg by mouth daily.      [provider]  HUMALOG KWIKPEN 100 UNIT/ML KiwkPen Inject 2-13 Units into the skin 4 (four) times daily. Sliding Scale Insulin 07/30/18   [provider]  Liniments (SALONPAS PAIN RELIEF PATCH EX) Place 1 patch onto the skin daily as needed (for pain.).    [provider]  Multiple Vitamin (MULTIVITAMIN WITH MINERALS) TABS tablet Take 1 tablet by mouth daily.    [provider]  mupirocin ointment (BACTROBAN) 2 % Apply 1 application topically daily. Apply to right foot incision daily 10/04/18   Rayburn, Fanny Bien, PA-C  ondansetron (ZOFRAN ODT) 4 MG disintegrating tablet Take 1 tablet (4 mg total) by mouth every 8 (eight) hours as needed for nausea or vomiting. 12/21/18   Dahlia Byes A, NP  ondansetron (ZOFRAN) 4 MG tablet Take 1 tablet (4 mg total) by mouth every 8 (eight) hours as needed for nausea or vomiting. 10/09/18   Rayburn, Fanny Bien, PA-C  Polyethyl Glycol-Propyl Glycol (SYSTANE OP) Place 1 drop into both eyes 3 (three) times daily as needed (dry eyes).     [provider]  simvastatin (ZOCOR) 40 MG tablet Take 40 mg by mouth daily at 8 pm.     [provider]  sulfamethoxazole-trimethoprim (BACTRIM DS,SEPTRA DS) 800-160 MG tablet Take 1 tablet by mouth 2 (two) times daily. 09/26/18   Adonis Huguenin, NP  sulfamethoxazole-trimethoprim (BACTRIM DS,SEPTRA DS) 800-160 MG tablet Take 1 tablet by mouth 2 (two) times daily. 09/27/18   Nadara Mustard, MD  traMADol (ULTRAM) 50 MG tablet Take 1 tablet (50 mg total) by mouth every 6 (six) hours as needed for moderate pain. 09/27/18   Nadara Mustard, MD  TRESIBA 100 UNIT/ML SOLN Inject 8 Units into the skin daily. 07/31/18   [provider]  triamcinolone (KENALOG) 0.025 % ointment Apply 1 application topically 2 (two) times daily. 12/21/18   Terrance Lanahan, Gloris Manchester A, NP  Turmeric 500 MG CAPS Take 500 mg by mouth daily at 12 noon.     [provider]    Family History Family History    Problem Relation Age of Onset  . Cancer Mother   . Heart Problems Father     Social History Social History   Tobacco Use  . Smoking status: Never Smoker  . Smokeless tobacco: Never Used  Substance Use Topics  .  Alcohol use: Yes    Alcohol/week: 7.0 standard drinks    Types: 7 Glasses of wine per week  . Drug use: Not on file     Allergies   Cefaclor; Tetracycline; Vancomycin; Augmentin [amoxicillin-pot clavulanate]; and Prednisone   Review of Systems Review of Systems   Physical Exam Triage Vital Signs ED Triage Vitals  Enc Vitals Group     BP 12/21/18 1330 137/74     Pulse Rate 12/21/18 1330 75     Resp --      Temp 12/21/18 1330 (!) 97.4 F (36.3 C)     Temp Source 12/21/18 1330 Oral     SpO2 12/21/18 1330 93 %     Weight 12/21/18 1328 113 lb (51.3 kg)     Height 12/21/18 1328 5' (1.524 m)     Head Circumference --      Peak Flow --      Pain Score 12/21/18 1328 5     Pain Loc --      Pain Edu? --      Excl. in GC? --    No data found.  Updated Vital Signs BP 137/74 (BP Location: Right Arm)   Pulse 75   Temp (!) 97.4 F (36.3 C) (Oral)   Ht 5' (1.524 m)   Wt 113 lb (51.3 kg)   SpO2 93%   BMI 22.07 kg/m   Visual Acuity Right Eye Distance:   Left Eye Distance:   Bilateral Distance:    Right Eye Near:   Left Eye Near:    Bilateral Near:     Physical Exam Vitals signs and nursing note reviewed.  Constitutional:      General: She is not in acute distress.    Appearance: She is well-developed.  HENT:     Head: Normocephalic and atraumatic.  Eyes:     Conjunctiva/sclera: Conjunctivae normal.  Neck:     Musculoskeletal: Neck supple.  Cardiovascular:     Rate and Rhythm: Normal rate and regular rhythm.     Heart sounds: No murmur.  Pulmonary:     Effort: Pulmonary effort is normal. No respiratory distress.     Breath sounds: Normal breath sounds.  Abdominal:     Palpations: Abdomen is soft.     Tenderness: There is no abdominal  tenderness.  Musculoskeletal: Normal range of motion.  Skin:    General: Skin is warm and dry.     Comments: Redness and swelling to the posterior right hand with blister. Tender to touch. Good ROM. Sensation and radial pulse intact.    Neurological:     Mental Status: She is alert.  Psychiatric:        Mood and Affect: Mood normal.      UC Treatments / Results  Labs (all labs ordered are listed, but only abnormal results are displayed) Labs Reviewed - No data to display  EKG None  Radiology No results found.  Procedures Procedures (including critical care time)  Medications Ordered in UC Medications - No data to display  Initial Impression / Assessment and Plan / UC Course  I have reviewed the triage vital signs and the nursing notes.  Pertinent labs & imaging results that were available during my care of the patient were reviewed by me and considered in my medical decision making (see chart for details).     It appears from what the patient is telling me the redness has decreased and it just now localized to the hand. She  is here with concerns of the swelling and blister that it causing her pain. It appears the antibiotics are helping with the infection but she is having inflammation and pain from the infection.  I feel it would be appropriate to give her some triamcinolone cream for the inflammation and she can take tylenol. No need to switch her abx as she has multiple allergies and the infection itself is better. Also gave Zofran to help with the nausea and vomiting related to the meds.  Instructed that if the problem worsens she will need to follow up with her PCP.  Pt understanding and agrees.    Final Clinical Impressions(s) / UC Diagnoses   Final diagnoses:  Cat bite, initial encounter     Discharge Instructions     I think the redness is related to a lot of inflammation from the infection I would like for you to keep taking the antibiotics that you are  prescribed You can take Zofran for nausea vomiting as needed I will give you a steroid cream for the inflammation in your hand You can also do ice to the area Follow up as needed for continued or worsening symptoms     ED Prescriptions    Medication Sig Dispense Auth. Provider   ondansetron (ZOFRAN ODT) 4 MG disintegrating tablet Take 1 tablet (4 mg total) by mouth every 8 (eight) hours as needed for nausea or vomiting. 20 tablet Cecille Mcclusky A, NP   triamcinolone (KENALOG) 0.025 % ointment Apply 1 application topically 2 (two) times daily. 30 g Janace Aris, NP     Controlled Substance Prescriptions [place] Controlled Substance Registry consulted? no   Janace Aris, NP 12/22/18 1426

## 2018-12-24 ENCOUNTER — Encounter (INDEPENDENT_AMBULATORY_CARE_PROVIDER_SITE_OTHER): Payer: Self-pay | Admitting: Orthopedic Surgery

## 2018-12-24 ENCOUNTER — Ambulatory Visit (INDEPENDENT_AMBULATORY_CARE_PROVIDER_SITE_OTHER): Payer: Medicare Other | Admitting: Orthopedic Surgery

## 2018-12-24 VITALS — Ht 60.0 in | Wt 113.0 lb

## 2018-12-24 DIAGNOSIS — M205X1 Other deformities of toe(s) (acquired), right foot: Secondary | ICD-10-CM | POA: Diagnosis not present

## 2018-12-24 DIAGNOSIS — M79641 Pain in right hand: Secondary | ICD-10-CM

## 2018-12-24 DIAGNOSIS — L03113 Cellulitis of right upper limb: Secondary | ICD-10-CM

## 2018-12-25 ENCOUNTER — Telehealth (INDEPENDENT_AMBULATORY_CARE_PROVIDER_SITE_OTHER): Payer: Self-pay | Admitting: Orthopedic Surgery

## 2018-12-25 NOTE — Telephone Encounter (Signed)
Printed AVS for patient to pick up at the front desk per request

## 2018-12-29 ENCOUNTER — Encounter (INDEPENDENT_AMBULATORY_CARE_PROVIDER_SITE_OTHER): Payer: Self-pay | Admitting: Orthopedic Surgery

## 2018-12-29 NOTE — Progress Notes (Signed)
Office Visit Note   Patient: Danielle Clarke           Date of Birth: 09/21/1941           MRN: [id number] Visit Date: 12/24/2018              Requested by: Juluis Rainier, MD [address] [place], [place] [address] PCP: Juluis Rainier, MD  Chief Complaint  Patient presents with  . Right Foot - Routine Post Op    09/27/18 I&D right foot 2nd toe post a claw tow repair.       HPI: Patient is a 78 year old woman who presents for 2 separate issues #1 she is status post debridement for claw toe surgery right foot second toe.  Patient states that her foot is doing well.  2.  Patient presents with acute cellulitis and abscess over the dorsum of the right hand.  She states that she went to the emergency room after a cat bite.  She went to the emergency room 2 days later.  She complains of redness and swelling over the dorsum of her hand she states she was given a prescription for clindamycin and Bactrim DS.  Patient presents with worsening abscess infection dorsum of the right hand.  Assessment & Plan: Visit Diagnoses:  1. Pain in right hand   2. Acquired claw toe of right foot     Plan: Patient underwent formal debridement deep cultures were obtained.  Patient will start Dial soap cleansing in 2 days follow-up in 1 week she will call if there are any changes discussed the possible need for IV antibiotics.  Recommended that she continue the antibiotics and recommended probiotics to minimize risk of C. difficile.  Follow-Up Instructions: Return in about 1 week (around 12/31/2018).   Ortho Exam  Patient is alert, oriented, no adenopathy, well-dressed, normal affect, normal respiratory effort. Examination patient's right foot the second toe is floating a little and she was given a silicone pad in a mall sleeve to hold the toe down.  There is no redness no cellulitis no open wound no signs of infection.  Examination of the dorsum of the right hand she has a large area of  cellulitis approximately 3 cm in diameter with fluctuance consistent with an abscess from a cat bite.  After informed consent and sterile prepping patient underwent a regional block with 10 cc of 1% lidocaine plain.  A 15 blade knife was used to incise the skin fluid from within the abscess was sent for cultures.  There was no purulence.  The wound was cleansed and packed open with iodoform gauze.  A sterile dressing was applied.  Imaging: No results found. No images are attached to the encounter.  Labs: Lab Results  Component Value Date   HGBA1C 7.9 (H) 01/04/2017   REPTSTATUS 10/02/2018 FINAL 09/27/2018   GRAMSTAIN  09/27/2018    FEW WBC PRESENT, PREDOMINANTLY PMN NO ORGANISMS SEEN    CULT  09/27/2018    RARE STAPHYLOCOCCUS AUREUS NO ANAEROBES ISOLATED Performed at [facility], [address] [place], [place] [address]    [facility] STAPHYLOCOCCUS AUREUS 09/27/2018     Lab Results  Component Value Date   ALBUMIN 3.4 (L) 01/05/2017   ALBUMIN 3.7 01/04/2017   ALBUMIN 3.9 10/07/2014    Body mass index is 22.07 kg/m.  Orders:  Orders Placed This Encounter  Procedures  . Anaerobic and Aerobic Culture   No orders of the defined types were placed in  this encounter.    Procedures: No procedures performed  Clinical Data: No additional findings.  ROS:  All other systems negative, except as noted in the HPI. Review of Systems  Objective: Vital Signs: Ht 5' (1.524 m)   Wt 113 lb (51.3 kg)   BMI 22.07 kg/m   Specialty Comments:  No specialty comments available.  PMFS History: Patient Active Problem List   Diagnosis Date Noted  . Cutaneous abscess of right foot   . Elevated troponin   . Abnormal EKG   . Bronchitis 08/05/2017  . Bilateral carotid artery stenosis   . Aortic stenosis 08/25/2015  . Cervical stenosis of spinal canal 07/31/2014  . CKD (chronic kidney disease) stage 2, GFR 60-89 ml/min 12/30/2013  . Essential hypertension, benign 12/30/2013    . Hyperlipidemia 12/30/2013  . Toe infection 12/30/2013  . Anemia 12/30/2013  . Acute renal failure (HCC) 12/29/2013  . Syncope 01/02/2013  . Diabetes mellitus type 1 (HCC) 01/02/2013  . Bilateral carotid artery disease (HCC) 01/02/2013  . GOITER, MULTINODULAR 02/11/2008   Past Medical History:  Diagnosis Date  . Arthritis   . Asthmatic bronchitis    with colds per patient  . Cataract   . Glaucoma   . Heart murmur    Dr Katrinka Blazing is her  cardiologist.   . Hyperlipemia   . Hypertension    Dr. Katrinka Blazing ~ 2 years ago  . Neck fracture [facility])    july 2013  . Osteoporosis   . Type 1 diabetes mellitus (HCC)     Family History  Problem Relation Age of Onset  . Cancer Mother   . Heart Problems Father     Past Surgical History:  Procedure Laterality Date  . ANKLE FRACTURE SURGERY Left 2001   steel plate and 3 screws   . ANTERIOR CERVICAL CORPECTOMY N/A 07/31/2014   Procedure: Cervical Four to Cervical Six Corpectomy;  Surgeon: Karn Cassis, MD;  Location: [facility];  Service: Neurosurgery;  Laterality: N/A;  C4 to C6 Corpectomy  . BREAST SURGERY  1988  . CATARACT EXTRACTION W/ INTRAOCULAR LENS  IMPLANT, BILATERAL Bilateral 2011   right and then left  . EYE SURGERY    . FRACTURE SURGERY    . HAMMER TOE SURGERY  1998  . I&D EXTREMITY Right 09/27/2018   Procedure: IRRIGATION AND DEBRIDEMENT RIGHT FOOT;  Surgeon: Nadara Mustard, MD;  Location[facility] OR;  Service: Orthopedics;  Laterality: Right;  . JOINT REPLACEMENT    . TONSILLECTOMY AND ADENOIDECTOMY  1948  . TOTAL SHOULDER REPLACEMENT  2010   right shoulder   . TUBAL LIGATION  1979  . TYMPANOSTOMY TUBE PLACEMENT Bilateral    Social History   Occupational History  . Occupation: Retired    Associate Professor: RETIRED  Tobacco Use  . Smoking status: Never Smoker  . Smokeless tobacco: Never Used  Substance and Sexual Activity  . Alcohol use: Yes    Alcohol/week: 7.0 standard drinks    Types: 7 Glasses of wine per week  . Drug use:  Not on file  . Sexual activity: Never

## 2018-12-30 LAB — ANAEROBIC AND AEROBIC CULTURE
AER RESULT:: NO GROWTH
MICRO NUMBER:: 115732
MICRO NUMBER:: 115733
SPECIMEN QUALITY:: ADEQUATE
SPECIMEN QUALITY:: ADEQUATE

## 2019-01-08 ENCOUNTER — Ambulatory Visit (INDEPENDENT_AMBULATORY_CARE_PROVIDER_SITE_OTHER): Payer: Medicare Other | Admitting: Family

## 2019-01-08 ENCOUNTER — Encounter (INDEPENDENT_AMBULATORY_CARE_PROVIDER_SITE_OTHER): Payer: Self-pay | Admitting: Orthopedic Surgery

## 2019-01-08 VITALS — Ht 60.0 in | Wt 113.0 lb

## 2019-01-08 DIAGNOSIS — M79641 Pain in right hand: Secondary | ICD-10-CM | POA: Diagnosis not present

## 2019-01-08 DIAGNOSIS — W5501XD Bitten by cat, subsequent encounter: Secondary | ICD-10-CM

## 2019-01-08 NOTE — Progress Notes (Signed)
Office Visit Note   Patient: Danielle Clarke           Date of Birth: Jul 15, 1941           MRN: [id number] Visit Date: 01/08/2019              Requested by: Juluis Rainier, MD [address] [place], [place] [address] PCP: Juluis Rainier, MD  Chief Complaint  Patient presents with  . Right Hand - Follow-up      HPI: Patient is a 78 year old woman who presents in follow-up for a cat bite over the dorsum of the right hand.  She feels she is improving significantly.  Continues with mupirocin dressing changes to the wound.  Has completed her clindamycin as well as her Bactrim DS.  No new concerns.   Assessment & Plan: Visit Diagnoses:  1. Cat bite, subsequent encounter     Plan: Patient underwent formal debridement deep cultures were obtained.  Patient will start Dial soap cleansing in 2 days follow-up in 1 week she will call if there are any changes discussed the possible need for IV antibiotics.  Recommended that she continue the antibiotics and recommended probiotics to minimize risk of C. difficile.  Follow-Up Instructions: Return if symptoms worsen or fail to improve.   Ortho Exam  Patient is alert, oriented, no adenopathy, well-dressed, normal affect, normal respiratory effort. Examination of the dorsum of the right hand she has a 3 cm in diameter area of improved cellulitis, slight erythema no warmth the wound is well-healed there is no sign of active infection.   Imaging: No results found. No images are attached to the encounter.  Labs: Lab Results  Component Value Date   HGBA1C 7.9 (H) 01/04/2017   REPTSTATUS 10/02/2018 FINAL 09/27/2018   GRAMSTAIN  09/27/2018    FEW WBC PRESENT, PREDOMINANTLY PMN NO ORGANISMS SEEN    CULT  09/27/2018    RARE STAPHYLOCOCCUS AUREUS NO ANAEROBES ISOLATED Performed at [facility], [address] [place], [place] [address]    [facility] STAPHYLOCOCCUS AUREUS 09/27/2018     Lab Results  Component Value Date     ALBUMIN 3.4 (L) 01/05/2017   ALBUMIN 3.7 01/04/2017   ALBUMIN 3.9 10/07/2014    Body mass index is 22.07 kg/m.  Orders:  No orders of the defined types were placed in this encounter.  No orders of the defined types were placed in this encounter.    Procedures: No procedures performed  Clinical Data: No additional findings.  ROS:  All other systems negative, except as noted in the HPI. Review of Systems  Skin: Negative for color change and wound.    Objective: Vital Signs: Ht 5' (1.524 m)   Wt 113 lb (51.3 kg)   BMI 22.07 kg/m   Specialty Comments:  No specialty comments available.  PMFS History: Patient Active Problem List   Diagnosis Date Noted  . Elevated troponin   . Abnormal EKG   . Bronchitis 08/05/2017  . Bilateral carotid artery stenosis   . Aortic stenosis 08/25/2015  . Cervical stenosis of spinal canal 07/31/2014  . CKD (chronic kidney disease) stage 2, GFR 60-89 ml/min 12/30/2013  . Essential hypertension, benign 12/30/2013  . Hyperlipidemia 12/30/2013  . Toe infection 12/30/2013  . Anemia 12/30/2013  . Acute renal failure (HCC) 12/29/2013  . Syncope 01/02/2013  . Diabetes mellitus type 1 (HCC) 01/02/2013  . Bilateral carotid artery disease (HCC) 01/02/2013  . GOITER, MULTINODULAR 02/11/2008   Past Medical History:  Diagnosis Date  . Arthritis   . Asthmatic bronchitis    with colds per patient  . Cataract   . Cutaneous abscess of right foot   . Glaucoma   . Heart murmur    Dr [name] is her  cardiologist.   . Hyperlipemia   . Hypertension    Dr. [name] ~ 2 years ago  . Neck fracture [facility](HCC)    july 2013  . Osteoporosis   . Type 1 diabetes mellitus (HCC)     Family History  Problem Relation Age of Onset  . Cancer Mother   . Heart Problems Father     Past Surgical History:  Procedure Laterality Date  . ANKLE FRACTURE SURGERY Left 2001   steel plate and 3 screws   . ANTERIOR CERVICAL CORPECTOMY N/A 07/31/2014   Procedure: Cervical  Four to Cervical Six Corpectomy;  Surgeon: Karn CassisErnesto M Botero, MD;  Location: [facility];  Service: Neurosurgery;  Laterality: N/A;  C4 to C6 Corpectomy  . BREAST SURGERY  1988  . CATARACT EXTRACTION W/ INTRAOCULAR LENS  IMPLANT, BILATERAL Bilateral 2011   right and then left  . EYE SURGERY    . FRACTURE SURGERY    . HAMMER TOE SURGERY  1998  . I&D EXTREMITY Right 09/27/2018   Procedure: IRRIGATION AND DEBRIDEMENT RIGHT FOOT;  Surgeon: Nadara Mustarduda, Marcus V, MD;  [facility];  Service: Orthopedics;  Laterality: Right;  . JOINT REPLACEMENT    . TONSILLECTOMY AND ADENOIDECTOMY  1948  . TOTAL SHOULDER REPLACEMENT  2010   right shoulder   . TUBAL LIGATION  1979  . TYMPANOSTOMY TUBE PLACEMENT Bilateral    Social History   Occupational History  . Occupation: Retired    Associate Professormployer: RETIRED  Tobacco Use  . Smoking status: Never Smoker  . Smokeless tobacco: Never Used  Substance and Sexual Activity  . Alcohol use: Yes    Alcohol/week: 7.0 standard drinks    Types: 7 Glasses of wine per week  . Drug use: Not on file  . Sexual activity: Never

## 2019-02-06 ENCOUNTER — Encounter (HOSPITAL_COMMUNITY): Payer: Self-pay

## 2019-02-06 ENCOUNTER — Emergency Department (HOSPITAL_COMMUNITY): Payer: Medicare Other

## 2019-02-06 ENCOUNTER — Other Ambulatory Visit: Payer: Self-pay

## 2019-02-06 ENCOUNTER — Inpatient Hospital Stay (HOSPITAL_COMMUNITY)
Admission: EM | Admit: 2019-02-06 | Discharge: 2019-02-14 | DRG: 040 | Disposition: A | Discharge status: Rehabilitation Facility | Payer: Medicare Other | Source: Other Acute Inpatient Hospital | Attending: Internal Medicine | Admitting: Internal Medicine

## 2019-02-06 ENCOUNTER — Observation Stay (HOSPITAL_COMMUNITY): Payer: Medicare Other

## 2019-02-06 DIAGNOSIS — J90 Pleural effusion, not elsewhere classified: Secondary | ICD-10-CM | POA: Diagnosis present

## 2019-02-06 DIAGNOSIS — R4701 Aphasia: Secondary | ICD-10-CM | POA: Diagnosis present

## 2019-02-06 DIAGNOSIS — R0902 Hypoxemia: Secondary | ICD-10-CM

## 2019-02-06 DIAGNOSIS — I442 Atrioventricular block, complete: Secondary | ICD-10-CM | POA: Diagnosis present

## 2019-02-06 DIAGNOSIS — E1022 Type 1 diabetes mellitus with diabetic chronic kidney disease: Secondary | ICD-10-CM | POA: Diagnosis present

## 2019-02-06 DIAGNOSIS — E785 Hyperlipidemia, unspecified: Secondary | ICD-10-CM

## 2019-02-06 DIAGNOSIS — R7309 Other abnormal glucose: Secondary | ICD-10-CM

## 2019-02-06 DIAGNOSIS — R4020 Unspecified coma: Secondary | ICD-10-CM | POA: Diagnosis present

## 2019-02-06 DIAGNOSIS — I5033 Acute on chronic diastolic (congestive) heart failure: Secondary | ICD-10-CM | POA: Diagnosis present

## 2019-02-06 DIAGNOSIS — Z96611 Presence of right artificial shoulder joint: Secondary | ICD-10-CM | POA: Diagnosis present

## 2019-02-06 DIAGNOSIS — Z881 Allergy status to other antibiotic agents status: Secondary | ICD-10-CM

## 2019-02-06 DIAGNOSIS — D72829 Elevated white blood cell count, unspecified: Secondary | ICD-10-CM | POA: Diagnosis not present

## 2019-02-06 DIAGNOSIS — G936 Cerebral edema: Secondary | ICD-10-CM | POA: Diagnosis present

## 2019-02-06 DIAGNOSIS — R1110 Vomiting, unspecified: Secondary | ICD-10-CM

## 2019-02-06 DIAGNOSIS — R410 Disorientation, unspecified: Secondary | ICD-10-CM | POA: Diagnosis present

## 2019-02-06 DIAGNOSIS — K3184 Gastroparesis: Secondary | ICD-10-CM | POA: Diagnosis present

## 2019-02-06 DIAGNOSIS — I629 Nontraumatic intracranial hemorrhage, unspecified: Secondary | ICD-10-CM

## 2019-02-06 DIAGNOSIS — S0093XA Contusion of unspecified part of head, initial encounter: Secondary | ICD-10-CM | POA: Diagnosis not present

## 2019-02-06 DIAGNOSIS — R55 Syncope and collapse: Secondary | ICD-10-CM | POA: Diagnosis present

## 2019-02-06 DIAGNOSIS — S066X9A Traumatic subarachnoid hemorrhage with loss of consciousness of unspecified duration, initial encounter: Secondary | ICD-10-CM | POA: Diagnosis not present

## 2019-02-06 DIAGNOSIS — E876 Hypokalemia: Secondary | ICD-10-CM | POA: Diagnosis present

## 2019-02-06 DIAGNOSIS — S06369A Traumatic hemorrhage of cerebrum, unspecified, with loss of consciousness of unspecified duration, initial encounter: Secondary | ICD-10-CM | POA: Diagnosis present

## 2019-02-06 DIAGNOSIS — E109 Type 1 diabetes mellitus without complications: Secondary | ICD-10-CM | POA: Diagnosis present

## 2019-02-06 DIAGNOSIS — Z79899 Other long term (current) drug therapy: Secondary | ICD-10-CM

## 2019-02-06 DIAGNOSIS — D649 Anemia, unspecified: Secondary | ICD-10-CM | POA: Diagnosis present

## 2019-02-06 DIAGNOSIS — R1312 Dysphagia, oropharyngeal phase: Secondary | ICD-10-CM | POA: Diagnosis not present

## 2019-02-06 DIAGNOSIS — W1830XA Fall on same level, unspecified, initial encounter: Secondary | ICD-10-CM | POA: Diagnosis present

## 2019-02-06 DIAGNOSIS — H409 Unspecified glaucoma: Secondary | ICD-10-CM | POA: Diagnosis present

## 2019-02-06 DIAGNOSIS — I350 Nonrheumatic aortic (valve) stenosis: Secondary | ICD-10-CM | POA: Diagnosis present

## 2019-02-06 DIAGNOSIS — I10 Essential (primary) hypertension: Secondary | ICD-10-CM

## 2019-02-06 DIAGNOSIS — Y92531 Health care provider office as the place of occurrence of the external cause: Secondary | ICD-10-CM

## 2019-02-06 DIAGNOSIS — I130 Hypertensive heart and chronic kidney disease with heart failure and stage 1 through stage 4 chronic kidney disease, or unspecified chronic kidney disease: Secondary | ICD-10-CM | POA: Diagnosis present

## 2019-02-06 DIAGNOSIS — I160 Hypertensive urgency: Secondary | ICD-10-CM | POA: Diagnosis present

## 2019-02-06 DIAGNOSIS — I6523 Occlusion and stenosis of bilateral carotid arteries: Secondary | ICD-10-CM | POA: Diagnosis present

## 2019-02-06 DIAGNOSIS — N183 Chronic kidney disease, stage 3 (moderate): Secondary | ICD-10-CM | POA: Diagnosis present

## 2019-02-06 DIAGNOSIS — Z888 Allergy status to other drugs, medicaments and biological substances status: Secondary | ICD-10-CM

## 2019-02-06 DIAGNOSIS — M810 Age-related osteoporosis without current pathological fracture: Secondary | ICD-10-CM | POA: Diagnosis present

## 2019-02-06 DIAGNOSIS — M1990 Unspecified osteoarthritis, unspecified site: Secondary | ICD-10-CM | POA: Diagnosis present

## 2019-02-06 DIAGNOSIS — Z950 Presence of cardiac pacemaker: Secondary | ICD-10-CM

## 2019-02-06 DIAGNOSIS — E1043 Type 1 diabetes mellitus with diabetic autonomic (poly)neuropathy: Secondary | ICD-10-CM | POA: Diagnosis present

## 2019-02-06 DIAGNOSIS — D638 Anemia in other chronic diseases classified elsewhere: Secondary | ICD-10-CM

## 2019-02-06 DIAGNOSIS — R4702 Dysphasia: Secondary | ICD-10-CM | POA: Diagnosis present

## 2019-02-06 DIAGNOSIS — R0781 Pleurodynia: Secondary | ICD-10-CM | POA: Diagnosis present

## 2019-02-06 DIAGNOSIS — E119 Type 2 diabetes mellitus without complications: Secondary | ICD-10-CM

## 2019-02-06 DIAGNOSIS — Z8249 Family history of ischemic heart disease and other diseases of the circulatory system: Secondary | ICD-10-CM

## 2019-02-06 DIAGNOSIS — Z7982 Long term (current) use of aspirin: Secondary | ICD-10-CM

## 2019-02-06 DIAGNOSIS — I619 Nontraumatic intracerebral hemorrhage, unspecified: Secondary | ICD-10-CM | POA: Diagnosis present

## 2019-02-06 DIAGNOSIS — Z794 Long term (current) use of insulin: Secondary | ICD-10-CM

## 2019-02-06 DIAGNOSIS — E872 Acidosis: Secondary | ICD-10-CM | POA: Diagnosis not present

## 2019-02-06 DIAGNOSIS — Z791 Long term (current) use of non-steroidal anti-inflammatories (NSAID): Secondary | ICD-10-CM

## 2019-02-06 DIAGNOSIS — N182 Chronic kidney disease, stage 2 (mild): Secondary | ICD-10-CM | POA: Diagnosis present

## 2019-02-06 DIAGNOSIS — R1111 Vomiting without nausea: Secondary | ICD-10-CM

## 2019-02-06 DIAGNOSIS — R001 Bradycardia, unspecified: Secondary | ICD-10-CM | POA: Diagnosis present

## 2019-02-06 DIAGNOSIS — J45909 Unspecified asthma, uncomplicated: Secondary | ICD-10-CM | POA: Diagnosis present

## 2019-02-06 DIAGNOSIS — E870 Hyperosmolality and hypernatremia: Secondary | ICD-10-CM

## 2019-02-06 DIAGNOSIS — D62 Acute posthemorrhagic anemia: Secondary | ICD-10-CM | POA: Diagnosis present

## 2019-02-06 HISTORY — DX: Nonrheumatic aortic (valve) stenosis: I35.0

## 2019-02-06 HISTORY — DX: Syncope and collapse: R55

## 2019-02-06 HISTORY — DX: Disorder of arteries and arterioles, unspecified: I77.9

## 2019-02-06 HISTORY — DX: Peripheral vascular disease, unspecified: I73.9

## 2019-02-06 LAB — CBC WITH DIFFERENTIAL/PLATELET
Abs Immature Granulocytes: 0.06 10*3/uL (ref 0.00–0.07)
Basophils Absolute: 0.1 10*3/uL (ref 0.0–0.1)
Basophils Relative: 1 %
Eosinophils Absolute: 0.1 10*3/uL (ref 0.0–0.5)
Eosinophils Relative: 1 %
HCT: 35.9 % — ABNORMAL LOW (ref 36.0–46.0)
Hemoglobin: 11.4 g/dL — ABNORMAL LOW (ref 12.0–15.0)
Immature Granulocytes: 1 %
Lymphocytes Relative: 10 %
Lymphs Abs: 1.1 10*3/uL (ref 0.7–4.0)
MCH: 29.2 pg (ref 26.0–34.0)
MCHC: 31.8 g/dL (ref 30.0–36.0)
MCV: 92.1 fL (ref 80.0–100.0)
Monocytes Absolute: 0.8 10*3/uL (ref 0.1–1.0)
Monocytes Relative: 7 %
Neutro Abs: 9.4 10*3/uL — ABNORMAL HIGH (ref 1.7–7.7)
Neutrophils Relative %: 80 %
Platelets: 273 10*3/uL (ref 150–400)
RBC: 3.9 MIL/uL (ref 3.87–5.11)
RDW: 13.4 % (ref 11.5–15.5)
WBC: 11.4 10*3/uL — ABNORMAL HIGH (ref 4.0–10.5)
nRBC: 0 % (ref 0.0–0.2)

## 2019-02-06 LAB — COMPREHENSIVE METABOLIC PANEL
ALT: 24 U/L (ref 0–44)
AST: 39 U/L (ref 15–41)
Albumin: 3.6 g/dL (ref 3.5–5.0)
Alkaline Phosphatase: 70 U/L (ref 38–126)
Anion gap: 8 (ref 5–15)
BUN: 20 mg/dL (ref 8–23)
CO2: 29 mmol/L (ref 22–32)
Calcium: 8.8 mg/dL — ABNORMAL LOW (ref 8.9–10.3)
Chloride: 98 mmol/L (ref 98–111)
Creatinine, Ser: 0.84 mg/dL (ref 0.44–1.00)
GFR calc Af Amer: >60 mL/min (ref >60)
GFR calc non Af Amer: >60 mL/min (ref >60)
Glucose, Bld: 173 mg/dL — ABNORMAL HIGH (ref 70–99)
Potassium: 4.1 mmol/L (ref 3.5–5.1)
Sodium: 135 mmol/L (ref 135–145)
Total Bilirubin: 0.5 mg/dL (ref 0.3–1.2)
Total Protein: 5.9 g/dL — ABNORMAL LOW (ref 6.5–8.1)

## 2019-02-06 LAB — CBG MONITORING, ED
Glucose-Capillary: 151 mg/dL — ABNORMAL HIGH (ref 70–99)
Glucose-Capillary: 192 mg/dL — ABNORMAL HIGH (ref 70–99)

## 2019-02-06 LAB — I-STAT CREATININE, ED: Creatinine, Ser: 0.8 mg/dL (ref 0.44–1.00)

## 2019-02-06 MED ORDER — FENTANYL CITRATE (PF) 100 MCG/2ML IJ SOLN: 25.0000 ug | Freq: Once | INTRAMUSCULAR | Status: DC

## 2019-02-06 MED ORDER — FENTANYL CITRATE (PF) 100 MCG/2ML IJ SOLN
25.0000 ug | Freq: Once | INTRAMUSCULAR | Status: AC
  Administered 2019-02-06: 25 ug via INTRAVENOUS
  Filled 2019-02-06: qty 2

## 2019-02-06 MED ORDER — ONDANSETRON HCL 4 MG/2ML IJ SOLN
4.0000 mg | Freq: Once | INTRAMUSCULAR | Status: AC
  Administered 2019-02-06: 4 mg via INTRAVENOUS
  Filled 2019-02-06: qty 2

## 2019-02-06 MED ORDER — IOHEXOL 300 MG/ML  SOLN
100.0000 mL | Freq: Once | INTRAMUSCULAR | Status: AC | PRN
  Administered 2019-02-06: 100 mL via INTRAVENOUS

## 2019-02-06 MED ORDER — SODIUM CHLORIDE 0.9 % IV BOLUS
500.0000 mL | Freq: Once | INTRAVENOUS | Status: AC
  Administered 2019-02-06: 500 mL via INTRAVENOUS

## 2019-02-06 NOTE — ED Notes (Signed)
Called CT with I-stat result. Transport is on the way for pt.

## 2019-02-06 NOTE — ED Notes (Signed)
Called CT and had Dr Effie Shy place an order for an istat creatine.

## 2019-02-06 NOTE — ED Triage Notes (Signed)
[facility] reports pt was checking out at the dentist after a routine cleaning when she had a syncopal episode and fell backwards striking her head. She also complains of right rib pain and lower back pain. Pt was pale and clammy, with a 211 cgb. She is not on blood thinners. [facility] was concerned about the t waves on the 12 lead.

## 2019-02-06 NOTE — ED Provider Notes (Signed)
[facility] EMERGENCY DEPARTMENT Provider Note   CSN: [id number] Arrival date & time: 02/06/19  1738    History   Chief Complaint Chief Complaint  Patient presents with   Loss of Consciousness    HPI Danielle Clarke is a 78 y.o. female.     HPI   Patient here for evaluation of fall, with syncope.  Apparently, she had had her teeth cleaned at the dentist, when she came suddenly confused and they checked her blood sugar.  It was greater than 190.  After that she had sudden syncope.  She apparently fell on the right side, and EMS was called.  They transferred her here for evaluation.  Patient has been otherwise well.  She is here with her daughter who states that since arriving to the ED, she has become confused.  Patient complains primarily pain in her head and is holding the right side of her chest.  She is unable to give additional history.  Level 5 caveat-altered mental status  Past Medical History:  Diagnosis Date   Arthritis    Asthmatic bronchitis    with colds per patient   Cataract    Cutaneous abscess of right foot    Glaucoma    Heart murmur    Dr Katrinka Blazing is her  cardiologist.    Hyperlipemia    Hypertension    Dr. Katrinka Blazing ~ 2 years ago   Neck fracture [facility])    july 2013   Osteoporosis    Type 1 diabetes mellitus [facility]     Patient Active Problem List   Diagnosis Date Noted   Intracranial bleeding (HCC) 02/06/2019   Elevated troponin    Abnormal EKG    Bronchitis 08/05/2017   Bilateral carotid artery stenosis    Aortic stenosis 08/25/2015   Cervical stenosis of spinal canal 07/31/2014   CKD (chronic kidney disease) stage 2, GFR 60-89 ml/min 12/30/2013   Essential hypertension, benign 12/30/2013   Hyperlipidemia 12/30/2013   Toe infection 12/30/2013   Anemia 12/30/2013   Acute renal failure (HCC) 12/29/2013   Syncope 01/02/2013   Diabetes mellitus type 1 (HCC) 01/02/2013   Bilateral carotid artery disease  (HCC) 01/02/2013   GOITER, MULTINODULAR 02/11/2008    Past Surgical History:  Procedure Laterality Date   ANKLE FRACTURE SURGERY Left 2001   steel plate and 3 screws    ANTERIOR CERVICAL CORPECTOMY N/A 07/31/2014   Procedure: Cervical Four to Cervical Six Corpectomy;  Surgeon: Karn Cassis, MD;  Location: [facility];  Service: Neurosurgery;  Laterality: N/A;  C4 to C6 Corpectomy   BREAST SURGERY  1988   CATARACT EXTRACTION W/ INTRAOCULAR LENS  IMPLANT, BILATERAL Bilateral 2011   right and then left   EYE SURGERY     FRACTURE SURGERY     HAMMER TOE SURGERY  1998   I&D EXTREMITY Right 09/27/2018   Procedure: IRRIGATION AND DEBRIDEMENT RIGHT FOOT;  Surgeon: Nadara Mustard, MD;  Location: [facility] OR;  Service: Orthopedics;  Laterality: Right;   JOINT REPLACEMENT     TONSILLECTOMY AND ADENOIDECTOMY  1948   TOTAL SHOULDER REPLACEMENT  2010   right shoulder    TUBAL LIGATION  1979   TYMPANOSTOMY TUBE PLACEMENT Bilateral      OB History    Gravida      Para      Term      Preterm      AB      Living  2  SAB      TAB      Ectopic      Multiple      Live Births               Home Medications    Prior to Admission medications   Medication Sig Start Date End Date Taking? Authorizing Provider  acetaminophen (TYLENOL) 500 MG tablet Take 500 mg by mouth 4 (four) times daily.    Yes [provider]  aspirin EC 81 MG tablet Take 81 mg by mouth daily.   Yes [provider]  Chromium Picolinate 1000 MCG TABS Take 1,000 mcg by mouth daily at 12 noon.    Yes [provider]  CINNAMON PO Take 1,000 mg by mouth 4 (four) times daily.   Yes [provider]  HUMALOG KWIKPEN 100 UNIT/ML KiwkPen Inject 2-13 Units into the skin 4 (four) times daily. Sliding Scale Insulin 07/30/18  Yes [provider]  Multiple Vitamin (MULTIVITAMIN WITH MINERALS) TABS tablet Take 1 tablet by mouth daily.   Yes [provider]    Probiotic Product (PROBIOTIC PO) Take 1 tablet by mouth daily.   Yes [provider]  simvastatin (ZOCOR) 40 MG tablet Take 40 mg by mouth daily at 8 pm.    Yes [provider]  traMADol (ULTRAM) 50 MG tablet Take 1 tablet (50 mg total) by mouth every 6 (six) hours as needed for moderate pain. 09/27/18  Yes Nadara Mustard, MD  TRESIBA 100 UNIT/ML SOLN Inject 10 Units into the skin daily.  07/31/18  Yes [provider]  Turmeric 500 MG CAPS Take 500 mg by mouth daily at 12 noon.    Yes [provider]  albuterol (PROVENTIL HFA;VENTOLIN HFA) 108 (90 Base) MCG/ACT inhaler Inhale 2 puffs into the lungs every 6 (six) hours as needed for wheezing or shortness of breath. Patient not taking: Reported on 02/06/2019 08/08/17   Regalado, Jon Billings A, MD  clindamycin (CLEOCIN) 300 MG capsule Take 1 capsule (300 mg total) by mouth 3 (three) times daily. Patient not taking: Reported on 02/06/2019 10/08/18   Rayburn, Fanny Bien, PA-C  mupirocin ointment (BACTROBAN) 2 % Apply 1 application topically daily. Apply to right foot incision daily Patient not taking: Reported on 02/06/2019 10/04/18   Rayburn, Fanny Bien, PA-C  ondansetron (ZOFRAN ODT) 4 MG disintegrating tablet Take 1 tablet (4 mg total) by mouth every 8 (eight) hours as needed for nausea or vomiting. Patient not taking: Reported on 02/06/2019 12/21/18   Dahlia Byes A, NP  ondansetron (ZOFRAN) 4 MG tablet Take 1 tablet (4 mg total) by mouth every 8 (eight) hours as needed for nausea or vomiting. Patient not taking: Reported on 02/06/2019 10/09/18   Rayburn, Fanny Bien, PA-C  sulfamethoxazole-trimethoprim (BACTRIM DS,SEPTRA DS) 800-160 MG tablet Take 1 tablet by mouth 2 (two) times daily. Patient not taking: Reported on 02/06/2019 09/26/18   Adonis Huguenin, NP  sulfamethoxazole-trimethoprim (BACTRIM DS,SEPTRA DS) 800-160 MG tablet Take 1 tablet by mouth 2 (two) times daily. Patient not taking: Reported on 02/06/2019  09/27/18   Nadara Mustard, MD  triamcinolone (KENALOG) 0.025 % ointment Apply 1 application topically 2 (two) times daily. Patient not taking: Reported on 02/06/2019 12/21/18   Janace Aris, NP    Family History Family History  Problem Relation Age of Onset   Cancer Mother    Heart Problems Father     Social History Social History   Tobacco Use   Smoking  status: Never Smoker   Smokeless tobacco: Never Used  Substance Use Topics   Alcohol use: Yes    Alcohol/week: 7.0 standard drinks    Types: 7 Glasses of wine per week   Drug use: Not on file     Allergies   Cefaclor; Tetracycline; Vancomycin; Augmentin [amoxicillin-pot clavulanate]; and Prednisone   Review of Systems Review of Systems  All other systems reviewed and are negative.    Physical Exam Updated Vital Signs BP (!) 185/57    Pulse 89    Temp 97.8 F (36.6 C) (Oral)    Resp 19    Ht 5' (1.524 m)    Wt 49.4 kg    SpO2 95%    BMI 21.29 kg/m   Physical Exam Vitals signs and nursing note reviewed.  Constitutional:      General: She is in acute distress.     Appearance: She is well-developed and normal weight. She is ill-appearing. She is not toxic-appearing or diaphoretic.     Comments: Elderly, frail  HENT:     Head: Normocephalic.     Comments: Contusion and laceration, right parietal occipital region.  No associated crepitation or deformity.  No facial injury, no trismus.    Right Ear: External ear normal.     Left Ear: External ear normal.     Nose: Nose normal.     Mouth/Throat:     Mouth: Mucous membranes are moist.     Pharynx: No oropharyngeal exudate.  Eyes:     Conjunctiva/sclera: Conjunctivae normal.     Pupils: Pupils are equal, round, and reactive to light.  Neck:     Musculoskeletal: Normal range of motion and neck supple.     Trachea: Phonation normal.  Cardiovascular:     Rate and Rhythm: Normal rate and regular rhythm.     Heart sounds: Normal heart sounds.  Pulmonary:      Effort: Pulmonary effort is normal.     Breath sounds: Normal breath sounds.  Chest:     Chest wall: Tenderness (Right chest wall, without crepitation or deformity) present.  Abdominal:     General: There is no distension.     Palpations: Abdomen is soft. There is no mass.     Tenderness: There is abdominal tenderness (Right upper quadrant, moderate).     Hernia: No hernia is present.  Musculoskeletal: Normal range of motion.        General: No tenderness, deformity or signs of injury.  Skin:    General: Skin is warm and dry.  Neurological:     Mental Status: She is alert.     Cranial Nerves: No cranial nerve deficit.     Motor: No abnormal muscle tone.     Coordination: Coordination normal.  Psychiatric:        Mood and Affect: Mood normal.        Behavior: Behavior normal.      ED Treatments / Results  Labs (all labs ordered are listed, but only abnormal results are displayed) Labs Reviewed  COMPREHENSIVE METABOLIC PANEL - Abnormal; Notable for the following components:      Result Value   Glucose, Bld 173 (*)    Calcium 8.8 (*)    Total Protein 5.9 (*)    All other components within normal limits  CBC WITH DIFFERENTIAL/PLATELET - Abnormal; Notable for the following components:   WBC 11.4 (*)    Hemoglobin 11.4 (*)    HCT 35.9 (*)    Neutro Abs  9.4 (*)    All other components within normal limits  CBG MONITORING, ED - Abnormal; Notable for the following components:   Glucose-Capillary 151 (*)    All other components within normal limits  URINALYSIS, ROUTINE W REFLEX MICROSCOPIC  I-STAT CREATININE, ED    EKG EKG Interpretation  Date/Time:  Thursday February 06 2019 20:13:12 EDT Ventricular Rate:  77 PR Interval:    QRS Duration: 108 QT Interval:  439 QTC Calculation: 497 R Axis:   -50 Text Interpretation:  Sinus rhythm Inferior infarct, old Anterior infarct, old Lateral leads are also involved Since last tracing Inferior Q waves noted Confirmed by Mancel Bale [phone]) on 02/06/2019 8:19:49 PM   Radiology Ct Head Wo Contrast  Result Date: 02/06/2019 CLINICAL DATA:  Fall EXAM: CT HEAD WITHOUT CONTRAST CT CERVICAL SPINE WITHOUT CONTRAST TECHNIQUE: Multidetector CT imaging of the head and cervical spine was performed following the standard protocol without intravenous contrast. Multiplanar CT image reconstructions of the cervical spine were also generated. COMPARISON:  Head CT 01/04/2017 FINDINGS: CT HEAD FINDINGS Brain: There is a left frontal intraparenchymal hematoma measuring 2.2 x 2.2 x 3.9 cm (volume = 9.9 cm^3) with moderate surrounding edema. There is mixed subdural and subarachnoid blood over the left convexity, extending into the sylvian fissure. Smaller amount of subarachnoid blood the right sylvian fissure. No midline shift or other significant mass effect. No hydrocephalus. Vascular: No abnormal hyperdensity of the major intracranial arteries or dural venous sinuses. No intracranial atherosclerosis. Skull: Large right parietal posterior scalp hematoma. Sinuses/Orbits: No fluid levels or advanced mucosal thickening of the visualized paranasal sinuses. No mastoid or middle ear effusion. The orbits are normal. CT CERVICAL SPINE FINDINGS Alignment: No static subluxation. Facets are aligned. Occipital condyles are normally positioned. Skull base and vertebrae: There is an old type 2 fracture of the dens with chronic nonunion. There is there is C3-C6 anterior fusion with solid anterior fusion mass. No acute fracture. Soft tissues and spinal canal: No prevertebral fluid or swelling. No visible canal hematoma. Disc levels: No bony spinal canal stenosis. Upper chest: No pneumothorax, pulmonary nodule or pleural effusion. Other: Normal visualized paraspinal cervical soft tissues. IMPRESSION: 1. Left frontal intraparenchymal hematoma with volume approximately 10 mL and mild surrounding edema with small volume subarachnoid/subdural blood over both convexities,  left-greater-than-right. 2. Posterior right parietal scalp hematoma, consistent with a contrecoup pattern of injury. 3. No acute fracture or static subluxation of the cervical spine. 4. 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 Critical Value/emergent results were called by telephone at the time of interpretation on 02/06/2019 at 8:41 pm to Dr. Mancel Bale , who verbally acknowledged these results. Electronically Signed   By: Deatra Robinson M.D.   On: 02/06/2019 20:47   Ct Chest W Contrast  Result Date: 02/06/2019 CLINICAL DATA:  Blunt abdominal trauma. EXAM: CT CHEST, ABDOMEN, AND PELVIS WITH CONTRAST TECHNIQUE: Multidetector CT imaging of the chest, abdomen and pelvis was performed following the standard protocol during bolus administration of intravenous contrast. CONTRAST:  OMNIPAQUE IOHEXOL 300 MG/ML  SOLN COMPARISON:  CT chest 08/06/2017. FINDINGS: CT CHEST FINDINGS Cardiovascular: Normal heart size. No pericardial effusions. Coronary artery and mitral valve annulus calcifications. Normal caliber thoracic aorta with calcifications. No aortic dissection. Central pulmonary arteries are well opacified without evidence of significant pulmonary embolus. Mediastinum/Nodes: Esophagus is decompressed. No significant lymphadenopathy in the chest. No abnormal mediastinal gas or fluid collections. Lungs/Pleura: Atelectasis in the lung bases. No focal consolidation. No pleural effusions. No  pneumothorax. Airways are patent. Musculoskeletal: Postoperative changes with lower cervical anterior fixation and right shoulder arthroplasty. Thoracolumbar scoliosis convex towards the right in the thoracic region and towards the left in the lumbar region. Diffuse degenerative changes throughout the thoracic spine. No vertebral compression deformities. Sternum and ribs appear intact. No acute displaced fractures identified. CT ABDOMEN PELVIS FINDINGS Hepatobiliary: No hepatic injury or perihepatic  hematoma. Gallbladder is unremarkable Pancreas: Unremarkable. No pancreatic ductal dilatation or surrounding inflammatory changes. Spleen: No splenic injury or perisplenic hematoma. Adrenals/Urinary Tract: No adrenal hemorrhage or renal injury identified. Bladder is unremarkable. Stomach/Bowel: Stomach, small bowel, and colon are not abnormally distended. Stool fills the colon. No wall thickening or inflammatory changes appreciated. No mesenteric hematoma or gas collections. Appendix is not identified. Vascular/Lymphatic: Aortic atherosclerosis. No enlarged abdominal or pelvic lymph nodes. Reproductive: Uterus and ovaries are not enlarged. Calcifications in the uterus likely representing calcified fibroids. Other: No free air or free fluid in the abdomen. Abdominal wall musculature appears intact. Musculoskeletal: Thoracolumbar scoliosis. No vertebral compression deformities. Degenerative changes throughout the lumbar spine with narrowed interspaces and endplate hypertrophic changes. Sacrum, pelvis, and hips appear intact. No acute fractures are identified. IMPRESSION: 1. No acute posttraumatic changes demonstrated in the chest, abdomen, or pelvis. 2. Atelectasis in the lung bases. 3. No evidence of mediastinal injury or pulmonary parenchymal injury. 4. No evidence of solid organ injury or bowel perforation. 5. Thoracolumbar scoliosis and degenerative changes. 6. Calcified uterine fibroids. 7. Aortic atherosclerosis. Electronically Signed   By: Burman NievesWilliam  Stevens M.D.   On: 02/06/2019 20:53   Ct Cervical Spine Wo Contrast  Result Date: 02/06/2019 CLINICAL DATA:  Fall EXAM: CT HEAD WITHOUT CONTRAST CT CERVICAL SPINE WITHOUT CONTRAST TECHNIQUE: Multidetector CT imaging of the head and cervical spine was performed following the standard protocol without intravenous contrast. Multiplanar CT image reconstructions of the cervical spine were also generated. COMPARISON:  Head CT 01/04/2017 FINDINGS: CT HEAD FINDINGS  Brain: There is a left frontal intraparenchymal hematoma measuring 2.2 x 2.2 x 3.9 cm (volume = 9.9 cm^3) with moderate surrounding edema. There is mixed subdural and subarachnoid blood over the left convexity, extending into the sylvian fissure. Smaller amount of subarachnoid blood the right sylvian fissure. No midline shift or other significant mass effect. No hydrocephalus. Vascular: No abnormal hyperdensity of the major intracranial arteries or dural venous sinuses. No intracranial atherosclerosis. Skull: Large right parietal posterior scalp hematoma. Sinuses/Orbits: No fluid levels or advanced mucosal thickening of the visualized paranasal sinuses. No mastoid or middle ear effusion. The orbits are normal. CT CERVICAL SPINE FINDINGS Alignment: No static subluxation. Facets are aligned. Occipital condyles are normally positioned. Skull base and vertebrae: There is an old type 2 fracture of the dens with chronic nonunion. There is there is C3-C6 anterior fusion with solid anterior fusion mass. No acute fracture. Soft tissues and spinal canal: No prevertebral fluid or swelling. No visible canal hematoma. Disc levels: No bony spinal canal stenosis. Upper chest: No pneumothorax, pulmonary nodule or pleural effusion. Other: Normal visualized paraspinal cervical soft tissues. IMPRESSION: 1. Left frontal intraparenchymal hematoma with volume approximately 10 mL and mild surrounding edema with small volume subarachnoid/subdural blood over both convexities, left-greater-than-right. 2. Posterior right parietal scalp hematoma, consistent with a contrecoup pattern of injury. 3. No acute fracture or static subluxation of the cervical spine. 4. 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 Critical Value/emergent results were called by telephone at the time of interpretation on 02/06/2019 at 8:41 pm to Dr. Mancel BaleELLIOTT Domonic Kimball ,  who verbally acknowledged these results. Electronically Signed   By: Deatra Robinson M.D.   On:  02/06/2019 20:47   Ct Abdomen Pelvis W Contrast  Result Date: 02/06/2019 CLINICAL DATA:  Blunt abdominal trauma. EXAM: CT CHEST, ABDOMEN, AND PELVIS WITH CONTRAST TECHNIQUE: Multidetector CT imaging of the chest, abdomen and pelvis was performed following the standard protocol during bolus administration of intravenous contrast. CONTRAST:  OMNIPAQUE IOHEXOL 300 MG/ML  SOLN COMPARISON:  CT chest 08/06/2017. FINDINGS: CT CHEST FINDINGS Cardiovascular: Normal heart size. No pericardial effusions. Coronary artery and mitral valve annulus calcifications. Normal caliber thoracic aorta with calcifications. No aortic dissection. Central pulmonary arteries are well opacified without evidence of significant pulmonary embolus. Mediastinum/Nodes: Esophagus is decompressed. No significant lymphadenopathy in the chest. No abnormal mediastinal gas or fluid collections. Lungs/Pleura: Atelectasis in the lung bases. No focal consolidation. No pleural effusions. No pneumothorax. Airways are patent. Musculoskeletal: Postoperative changes with lower cervical anterior fixation and right shoulder arthroplasty. Thoracolumbar scoliosis convex towards the right in the thoracic region and towards the left in the lumbar region. Diffuse degenerative changes throughout the thoracic spine. No vertebral compression deformities. Sternum and ribs appear intact. No acute displaced fractures identified. CT ABDOMEN PELVIS FINDINGS Hepatobiliary: No hepatic injury or perihepatic hematoma. Gallbladder is unremarkable Pancreas: Unremarkable. No pancreatic ductal dilatation or surrounding inflammatory changes. Spleen: No splenic injury or perisplenic hematoma. Adrenals/Urinary Tract: No adrenal hemorrhage or renal injury identified. Bladder is unremarkable. Stomach/Bowel: Stomach, small bowel, and colon are not abnormally distended. Stool fills the colon. No wall thickening or inflammatory changes appreciated. No mesenteric hematoma or gas  collections. Appendix is not identified. Vascular/Lymphatic: Aortic atherosclerosis. No enlarged abdominal or pelvic lymph nodes. Reproductive: Uterus and ovaries are not enlarged. Calcifications in the uterus likely representing calcified fibroids. Other: No free air or free fluid in the abdomen. Abdominal wall musculature appears intact. Musculoskeletal: Thoracolumbar scoliosis. No vertebral compression deformities. Degenerative changes throughout the lumbar spine with narrowed interspaces and endplate hypertrophic changes. Sacrum, pelvis, and hips appear intact. No acute fractures are identified. IMPRESSION: 1. No acute posttraumatic changes demonstrated in the chest, abdomen, or pelvis. 2. Atelectasis in the lung bases. 3. No evidence of mediastinal injury or pulmonary parenchymal injury. 4. No evidence of solid organ injury or bowel perforation. 5. Thoracolumbar scoliosis and degenerative changes. 6. Calcified uterine fibroids. 7. Aortic atherosclerosis. Electronically Signed   By: Burman Nieves M.D.   On: 02/06/2019 20:53    Procedures .Critical Care Performed by: Mancel Bale, MD Authorized by: Mancel Bale, MD   Critical care provider statement:    Critical care time (minutes):  35   Critical care start time:  02/06/2019 7:20 PM   Critical care end time:  02/06/2019 10:00 PM   Critical care time was exclusive of:  Separately billable procedures and treating other patients   Critical care was necessary to treat or prevent imminent or life-threatening deterioration of the following conditions:  CNS failure or compromise   Critical care was time spent personally by me on the following activities:  Blood draw for specimens, development of treatment plan with patient or surrogate, discussions with consultants, evaluation of patient's response to treatment, examination of patient, obtaining history from patient or surrogate, ordering and performing treatments and interventions, ordering and  review of laboratory studies, pulse oximetry, re-evaluation of patient's condition, review of old charts and ordering and review of radiographic studies   (including critical care time)  Medications Ordered in ED Medications  fentaNYL (SUBLIMAZE) injection 25 mcg (  has no administration in time range)  sodium chloride 0.9 % bolus 500 mL (500 mLs Intravenous New Bag/Given 02/06/19 2003)  ondansetron (ZOFRAN) injection 4 mg (4 mg Intravenous Given 02/06/19 1948)  fentaNYL (SUBLIMAZE) injection 25 mcg (25 mcg Intravenous Given 02/06/19 1949)  iohexol (OMNIPAQUE) 300 MG/ML solution 100 mL (100 mLs Intravenous Contrast Given 02/06/19 2035)  ondansetron (ZOFRAN) injection 4 mg (4 mg Intravenous Given 02/06/19 2136)     Initial Impression / Assessment and Plan / ED Course  I have reviewed the triage vital signs and the nursing notes.  Pertinent labs & imaging results that were available during my care of the patient were reviewed by me and considered in my medical decision making (see chart for details).  Clinical Course as of Feb 06 2243  Thu Feb 06, 2019  2040 Case discussed with radiologist, Dr. Chase Picket.  Patient has subarachnoid blood present, bifrontal.  Blood is mixed density.  Images reviewed by me  CT Head Wo Contrast [EW]  2054 Normal  I-stat Creatinine, ED [EW]  2054 Normal except glucose high, calcium low, total protein low  Comprehensive metabolic panel(!) [EW]  2054 Normal except white count high, hemoglobin low  CBC with Differential(!) [EW]  2105 Case discussed with on-call neurosurgeon provider, Costello, he will come to the ED to evaluate the patient.  He recommends hospitalist admit.   [EW]    Clinical Course User Index [EW] Mancel Bale, MD        Patient Vitals for the past 24 hrs:  BP Temp Temp src Pulse Resp SpO2 Height Weight  02/06/19 2135 (!) 185/57 -- -- 89 19 95 % -- --  02/06/19 2030 (!) 180/53 -- -- 83 (!) 22 93 % -- --  02/06/19 2015 (!) 158/46 -- -- --  -- -- -- --  02/06/19 2014 -- -- -- 75 13 94 % -- --  02/06/19 2000 (!) 167/50 -- -- 76 20 92 % -- --  02/06/19 1945 (!) 175/58 -- -- 75 (!) 22 97 % -- --  02/06/19 1930 (!) 186/59 -- -- 76 18 94 % -- --  02/06/19 1915 (!) 187/58 -- -- 75 13 97 % -- --  02/06/19 1747 -- -- -- -- -- -- 5' (1.524 m) 49.4 kg  02/06/19 1740 (!) 193/53 97.8 F (36.6 C) Oral 70 20 99 % -- --    9:30 PM Reevaluation with update and discussion. After initial assessment and treatment, an updated evaluation reveals she remains uncomfortable and confused.  She is being evaluated by neurosurgery.Mancel Bale   Medical Decision Making: Fall with syncope.  Injury to right posterior occipital parietal region.  Small laceration not requiring closure.  Imaging indicates intracranial hemorrhage left frontal, possible contrecoup injury, plus minus spontaneous bleeding, from AVM.  EKG does not indicate acute coronary event.  Patient requires admission, with further work-up by hospitalist regarding syncope and management of intracranial bleeding by neurosurgery.  No urgent surgery indicated according to neurosurgical consultation.  CRITICAL CARE-yes Performed by: Mancel Bale  Nursing Notes Reviewed/ Care Coordinated Applicable Imaging Reviewed Interpretation of Laboratory Data incorporated into ED treatment   8:43 PM-Consult complete with hospitalist. Patient case explained and discussed.  He agrees to admit patient for further evaluation and treatment. Call ended at 8:50 PM  Plan: Admit  Final Clinical Impressions(s) / ED Diagnoses   Final diagnoses:  None    ED Discharge Orders    None       Mancel Bale, MD 02/06/19  2244 ° °

## 2019-02-06 NOTE — ED Notes (Signed)
Checked cbg.

## 2019-02-06 NOTE — ED Notes (Signed)
Patient transported to MRI 

## 2019-02-06 NOTE — Consult Note (Addendum)
Chief Complaint   Chief Complaint  Patient presents with   Loss of Consciousness    HPI   Consult requested by: Dr Effie Shy, EDP Reason for consult: ICH  HPI: Danielle Clarke is a 78 y.o. female who presents after a syncopal episode at the dentist's office. Patient is aphasic and unable to provide any history. Daughter at bedside was present at dentists office and able to give history. Went to Dentist for routine cleaning/check up. During check out, she suddenly became confused and syncopized. Has been aphasic since. Not on blood thinning agents.    Patient Active Problem List   Diagnosis Date Noted   Elevated troponin    Abnormal EKG    Bronchitis 08/05/2017   Bilateral carotid artery stenosis    Aortic stenosis 08/25/2015   Cervical stenosis of spinal canal 07/31/2014   CKD (chronic kidney disease) stage 2, GFR 60-89 ml/min 12/30/2013   Essential hypertension, benign 12/30/2013   Hyperlipidemia 12/30/2013   Toe infection 12/30/2013   Anemia 12/30/2013   Acute renal failure (HCC) 12/29/2013   Syncope 01/02/2013   Diabetes mellitus type 1 (HCC) 01/02/2013   Bilateral carotid artery disease (HCC) 01/02/2013   GOITER, MULTINODULAR 02/11/2008    PMH: Past Medical History:  Diagnosis Date   Arthritis    Asthmatic bronchitis    with colds per patient   Cataract    Cutaneous abscess of right foot    Glaucoma    Heart murmur    Dr Katrinka Blazing is her  cardiologist.    Hyperlipemia    Hypertension    Dr. Katrinka Blazing ~ 2 years ago   Neck fracture [facility])    july 2013   Osteoporosis    Type 1 diabetes mellitus (HCC)     PSH: Past Surgical History:  Procedure Laterality Date   ANKLE FRACTURE SURGERY Left 2001   steel plate and 3 screws    ANTERIOR CERVICAL CORPECTOMY N/A 07/31/2014   Procedure: Cervical Four to Cervical Six Corpectomy;  Surgeon: Karn Cassis, MD;  Location: [facility];  Service: Neurosurgery;  Laterality: N/A;  C4 to C6  Corpectomy   BREAST SURGERY  1988   CATARACT EXTRACTION W/ INTRAOCULAR LENS  IMPLANT, BILATERAL Bilateral 2011   right and then left   EYE SURGERY     FRACTURE SURGERY     HAMMER TOE SURGERY  1998   I&D EXTREMITY Right 09/27/2018   Procedure: IRRIGATION AND DEBRIDEMENT RIGHT FOOT;  Surgeon: Nadara Mustard, MD;  Location: [facility] OR;  Service: Orthopedics;  Laterality: Right;   JOINT REPLACEMENT     TONSILLECTOMY AND ADENOIDECTOMY  1948   TOTAL SHOULDER REPLACEMENT  2010   right shoulder    TUBAL LIGATION  1979   TYMPANOSTOMY TUBE PLACEMENT Bilateral     (Not in a hospital admission)   SH: Social History   Tobacco Use   Smoking status: Never Smoker   Smokeless tobacco: Never Used  Substance Use Topics   Alcohol use: Yes    Alcohol/week: 7.0 standard drinks    Types: 7 Glasses of wine per week   Drug use: Not on file    MEDS: Prior to Admission medications   Medication Sig Start Date End Date Taking? Authorizing Provider  acetaminophen (TYLENOL) 500 MG tablet Take 500 mg by mouth 4 (four) times daily.    Yes [provider]  aspirin EC 81 MG tablet Take 81 mg by mouth daily.   Yes [provider]  Chromium  Picolinate 1000 MCG TABS Take 1,000 mcg by mouth daily at 12 noon.    Yes [provider]  CINNAMON PO Take 1,000 mg by mouth 4 (four) times daily.   Yes [provider]  HUMALOG KWIKPEN 100 UNIT/ML KiwkPen Inject 2-13 Units into the skin 4 (four) times daily. Sliding Scale Insulin 07/30/18  Yes [provider]  Multiple Vitamin (MULTIVITAMIN WITH MINERALS) TABS tablet Take 1 tablet by mouth daily.   Yes [provider]  Probiotic Product (PROBIOTIC PO) Take 1 tablet by mouth daily.   Yes [provider]  simvastatin (ZOCOR) 40 MG tablet Take 40 mg by mouth daily at 8 pm.    Yes [provider]  traMADol (ULTRAM) 50 MG tablet Take 1 tablet (50 mg total) by mouth every 6 (six) hours as needed for  moderate pain. 09/27/18  Yes Nadara Mustard, MD  TRESIBA 100 UNIT/ML SOLN Inject 10 Units into the skin daily.  07/31/18  Yes [provider]  Turmeric 500 MG CAPS Take 500 mg by mouth daily at 12 noon.    Yes [provider]  albuterol (PROVENTIL HFA;VENTOLIN HFA) 108 (90 Base) MCG/ACT inhaler Inhale 2 puffs into the lungs every 6 (six) hours as needed for wheezing or shortness of breath. Patient not taking: Reported on 02/06/2019 08/08/17   Regalado, Jon Billings A, MD  clindamycin (CLEOCIN) 300 MG capsule Take 1 capsule (300 mg total) by mouth 3 (three) times daily. Patient not taking: Reported on 02/06/2019 10/08/18   Rayburn, Fanny Bien, PA-C  mupirocin ointment (BACTROBAN) 2 % Apply 1 application topically daily. Apply to right foot incision daily Patient not taking: Reported on 02/06/2019 10/04/18   Rayburn, Fanny Bien, PA-C  ondansetron (ZOFRAN ODT) 4 MG disintegrating tablet Take 1 tablet (4 mg total) by mouth every 8 (eight) hours as needed for nausea or vomiting. Patient not taking: Reported on 02/06/2019 12/21/18   Dahlia Byes A, NP  ondansetron (ZOFRAN) 4 MG tablet Take 1 tablet (4 mg total) by mouth every 8 (eight) hours as needed for nausea or vomiting. Patient not taking: Reported on 02/06/2019 10/09/18   Rayburn, Fanny Bien, PA-C  sulfamethoxazole-trimethoprim (BACTRIM DS,SEPTRA DS) 800-160 MG tablet Take 1 tablet by mouth 2 (two) times daily. Patient not taking: Reported on 02/06/2019 09/26/18   Adonis Huguenin, NP  sulfamethoxazole-trimethoprim (BACTRIM DS,SEPTRA DS) 800-160 MG tablet Take 1 tablet by mouth 2 (two) times daily. Patient not taking: Reported on 02/06/2019 09/27/18   Nadara Mustard, MD  triamcinolone (KENALOG) 0.025 % ointment Apply 1 application topically 2 (two) times daily. Patient not taking: Reported on 02/06/2019 12/21/18   Janace Aris, NP    ALLERGY: Allergies  Allergen Reactions   Cefaclor Anaphylaxis   Tetracycline Anaphylaxis    Vancomycin Anaphylaxis   Augmentin [Amoxicillin-Pot Clavulanate] Other (See Comments)    Reaction unknown >> Anaphylaxis Has patient had a PCN reaction causing immediate rash, facial/tongue/throat swelling, SOB or lightheadedness with hypotension: Unknown Has patient had a PCN reaction causing severe rash involving mucus membranes or skin necrosis: Unknown Has patient had a PCN reaction that required hospitalization: Unknown Has patient had a PCN reaction occurring within the last 10 years: Unknown If all of the above answers are "NO", then may proceed with Cephalosporin use.    Prednisone Other (See Comments)    Reaction unknown    Social History   Tobacco Use   Smoking status: Never Smoker   Smokeless tobacco: Never Used  Substance Use  Topics   Alcohol use: Yes    Alcohol/week: 7.0 standard drinks    Types: 7 Glasses of wine per week     Family History  Problem Relation Age of Onset   Cancer Mother    Heart Problems Father      ROS   ROS unable to obtain, aphasic  Exam   Vitals:   02/06/19 2030 02/06/19 2135  BP: (!) 180/53 (!) 185/57  Pulse: 83 89  Resp: (!) 22 19  Temp:    SpO2: 93% 95%    General appearance: elderly female, laying with eyes closed GCS 12 - [id number] Eyes: No scleral injection Cardiovascular: Regular rate and rhythm without murmurs, rubs, gallops. No edema or variciosities. Distal pulses normal. Pulmonary: Effort normal, non-labored breathing Musculoskeletal: MAEW with symmetric strength, nonfocal exam Neurological  Patient is drowsy but easily awakened  mumbles at times with incomprehensible speech  able to follow commands with encouragement  CN grossly intact  unable to check FNF/HKS  Results - Imaging/Labs   Results for orders placed or performed during the hospital encounter of 02/06/19 (from the past 48 hour(s))  CBG monitoring, ED     Status: Abnormal   Collection Time: 02/06/19  7:24 PM  Result Value Ref Range    Glucose-Capillary 151 (H) 70 - 99 mg/dL  Comprehensive metabolic panel     Status: Abnormal   Collection Time: 02/06/19  7:57 PM  Result Value Ref Range   Sodium 135 135 - 145 mmol/L   Potassium 4.1 3.5 - 5.1 mmol/L   Chloride 98 98 - 111 mmol/L   CO2 29 22 - 32 mmol/L   Glucose, Bld 173 (H) 70 - 99 mg/dL   BUN 20 8 - 23 mg/dL   Creatinine, Ser 8.29 0.44 - 1.00 mg/dL   Calcium 8.8 (L) 8.9 - 10.3 mg/dL   Total Protein 5.9 (L) 6.5 - 8.1 g/dL   Albumin 3.6 3.5 - 5.0 g/dL   AST 39 15 - 41 U/L   ALT 24 0 - 44 U/L   Alkaline Phosphatase 70 38 - 126 U/L   Total Bilirubin 0.5 0.3 - 1.2 mg/dL   GFR calc non Af Amer >60 >60 mL/min   GFR calc Af Amer >60 >60 mL/min   Anion gap 8 5 - 15    Comment: Performed at [facility], [address], [place], [place] [address]  CBC with Differential     Status: Abnormal   Collection Time: 02/06/19  7:57 PM  Result Value Ref Range   WBC 11.4 (H) 4.0 - 10.5 K/uL   RBC 3.90 3.87 - 5.11 MIL/uL   Hemoglobin 11.4 (L) 12.0 - 15.0 g/dL   HCT 08.6 (L) 57.8 - 46.9 %   MCV 92.1 80.0 - 100.0 fL   MCH 29.2 26.0 - 34.0 pg   MCHC 31.8 30.0 - 36.0 g/dL   RDW 62.9 52.8 - 41.3 %   Platelets 273 150 - 400 K/uL   nRBC 0.0 0.0 - 0.2 %   Neutrophils Relative % 80 %   Neutro Abs 9.4 (H) 1.7 - 7.7 K/uL   Lymphocytes Relative 10 %   Lymphs Abs 1.1 0.7 - 4.0 K/uL   Monocytes Relative 7 %   Monocytes Absolute 0.8 0.1 - 1.0 K/uL   Eosinophils Relative 1 %   Eosinophils Absolute 0.1 0.0 - 0.5 K/uL   Basophils Relative 1 %   Basophils Absolute 0.1 0.0 - 0.1 K/uL   Immature Granulocytes  1 %   Abs Immature Granulocytes 0.06 0.00 - 0.07 K/uL    Comment: Performed at [facility], [address], [place], [place] [address]  I-stat Creatinine, ED     Status: None   Collection Time: 02/06/19  8:04 PM  Result Value Ref Range   Creatinine, Ser 0.80 0.44 - 1.00 mg/dL    Ct Head Wo Contrast  Result Date: 02/06/2019 CLINICAL DATA:  Fall EXAM: CT HEAD WITHOUT  CONTRAST CT CERVICAL SPINE WITHOUT CONTRAST TECHNIQUE: Multidetector CT imaging of the head and cervical spine was performed following the standard protocol without intravenous contrast. Multiplanar CT image reconstructions of the cervical spine were also generated. COMPARISON:  Head CT 01/04/2017 FINDINGS: CT HEAD FINDINGS Brain: There is a left frontal intraparenchymal hematoma measuring 2.2 x 2.2 x 3.9 cm (volume = 9.9 cm^3) with moderate surrounding edema. There is mixed subdural and subarachnoid blood over the left convexity, extending into the sylvian fissure. Smaller amount of subarachnoid blood the right sylvian fissure. No midline shift or other significant mass effect. No hydrocephalus. Vascular: No abnormal hyperdensity of the major intracranial arteries or dural venous sinuses. No intracranial atherosclerosis. Skull: Large right parietal posterior scalp hematoma. Sinuses/Orbits: No fluid levels or advanced mucosal thickening of the visualized paranasal sinuses. No mastoid or middle ear effusion. The orbits are normal. CT CERVICAL SPINE FINDINGS Alignment: No static subluxation. Facets are aligned. Occipital condyles are normally positioned. Skull base and vertebrae: There is an old type 2 fracture of the dens with chronic nonunion. There is there is C3-C6 anterior fusion with solid anterior fusion mass. No acute fracture. Soft tissues and spinal canal: No prevertebral fluid or swelling. No visible canal hematoma. Disc levels: No bony spinal canal stenosis. Upper chest: No pneumothorax, pulmonary nodule or pleural effusion. Other: Normal visualized paraspinal cervical soft tissues. IMPRESSION: 1. Left frontal intraparenchymal hematoma with volume approximately 10 mL and mild surrounding edema with small volume subarachnoid/subdural blood over both convexities, left-greater-than-right. 2. Posterior right parietal scalp hematoma, consistent with a contrecoup pattern of injury. 3. No acute fracture or static  subluxation of the cervical spine. 4. 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 Critical Value/emergent results were called by telephone at the time of interpretation on 02/06/2019 at 8:41 pm to Dr. Mancel Bale , who verbally acknowledged these results. Electronically Signed   By: Deatra Robinson M.D.   On: 02/06/2019 20:47   Ct Chest W Contrast  Result Date: 02/06/2019 CLINICAL DATA:  Blunt abdominal trauma. EXAM: CT CHEST, ABDOMEN, AND PELVIS WITH CONTRAST TECHNIQUE: Multidetector CT imaging of the chest, abdomen and pelvis was performed following the standard protocol during bolus administration of intravenous contrast. CONTRAST:  OMNIPAQUE IOHEXOL 300 MG/ML  SOLN COMPARISON:  CT chest 08/06/2017. FINDINGS: CT CHEST FINDINGS Cardiovascular: Normal heart size. No pericardial effusions. Coronary artery and mitral valve annulus calcifications. Normal caliber thoracic aorta with calcifications. No aortic dissection. Central pulmonary arteries are well opacified without evidence of significant pulmonary embolus. Mediastinum/Nodes: Esophagus is decompressed. No significant lymphadenopathy in the chest. No abnormal mediastinal gas or fluid collections. Lungs/Pleura: Atelectasis in the lung bases. No focal consolidation. No pleural effusions. No pneumothorax. Airways are patent. Musculoskeletal: Postoperative changes with lower cervical anterior fixation and right shoulder arthroplasty. Thoracolumbar scoliosis convex towards the right in the thoracic region and towards the left in the lumbar region. Diffuse degenerative changes throughout the thoracic spine. No vertebral compression deformities. Sternum and ribs appear intact. No acute displaced fractures identified. CT  ABDOMEN PELVIS FINDINGS Hepatobiliary: No hepatic injury or perihepatic hematoma. Gallbladder is unremarkable Pancreas: Unremarkable. No pancreatic ductal dilatation or surrounding inflammatory changes. Spleen: No splenic injury  or perisplenic hematoma. Adrenals/Urinary Tract: No adrenal hemorrhage or renal injury identified. Bladder is unremarkable. Stomach/Bowel: Stomach, small bowel, and colon are not abnormally distended. Stool fills the colon. No wall thickening or inflammatory changes appreciated. No mesenteric hematoma or gas collections. Appendix is not identified. Vascular/Lymphatic: Aortic atherosclerosis. No enlarged abdominal or pelvic lymph nodes. Reproductive: Uterus and ovaries are not enlarged. Calcifications in the uterus likely representing calcified fibroids. Other: No free air or free fluid in the abdomen. Abdominal wall musculature appears intact. Musculoskeletal: Thoracolumbar scoliosis. No vertebral compression deformities. Degenerative changes throughout the lumbar spine with narrowed interspaces and endplate hypertrophic changes. Sacrum, pelvis, and hips appear intact. No acute fractures are identified. IMPRESSION: 1. No acute posttraumatic changes demonstrated in the chest, abdomen, or pelvis. 2. Atelectasis in the lung bases. 3. No evidence of mediastinal injury or pulmonary parenchymal injury. 4. No evidence of solid organ injury or bowel perforation. 5. Thoracolumbar scoliosis and degenerative changes. 6. Calcified uterine fibroids. 7. Aortic atherosclerosis. Electronically Signed   By: Burman NievesWilliam  Stevens M.D.   On: 02/06/2019 20:53   Ct Cervical Spine Wo Contrast  Result Date: 02/06/2019 CLINICAL DATA:  Fall EXAM: CT HEAD WITHOUT CONTRAST CT CERVICAL SPINE WITHOUT CONTRAST TECHNIQUE: Multidetector CT imaging of the head and cervical spine was performed following the standard protocol without intravenous contrast. Multiplanar CT image reconstructions of the cervical spine were also generated. COMPARISON:  Head CT 01/04/2017 FINDINGS: CT HEAD FINDINGS Brain: There is a left frontal intraparenchymal hematoma measuring 2.2 x 2.2 x 3.9 cm (volume = 9.9 cm^3) with moderate surrounding edema. There is mixed subdural  and subarachnoid blood over the left convexity, extending into the sylvian fissure. Smaller amount of subarachnoid blood the right sylvian fissure. No midline shift or other significant mass effect. No hydrocephalus. Vascular: No abnormal hyperdensity of the major intracranial arteries or dural venous sinuses. No intracranial atherosclerosis. Skull: Large right parietal posterior scalp hematoma. Sinuses/Orbits: No fluid levels or advanced mucosal thickening of the visualized paranasal sinuses. No mastoid or middle ear effusion. The orbits are normal. CT CERVICAL SPINE FINDINGS Alignment: No static subluxation. Facets are aligned. Occipital condyles are normally positioned. Skull base and vertebrae: There is an old type 2 fracture of the dens with chronic nonunion. There is there is C3-C6 anterior fusion with solid anterior fusion mass. No acute fracture. Soft tissues and spinal canal: No prevertebral fluid or swelling. No visible canal hematoma. Disc levels: No bony spinal canal stenosis. Upper chest: No pneumothorax, pulmonary nodule or pleural effusion. Other: Normal visualized paraspinal cervical soft tissues. IMPRESSION: 1. Left frontal intraparenchymal hematoma with volume approximately 10 mL and mild surrounding edema with small volume subarachnoid/subdural blood over both convexities, left-greater-than-right. 2. Posterior right parietal scalp hematoma, consistent with a contrecoup pattern of injury. 3. No acute fracture or static subluxation of the cervical spine. 4. 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 Critical Value/emergent results were called by telephone at the time of interpretation on 02/06/2019 at 8:41 pm to Dr. Mancel BaleELLIOTT WENTZ , who verbally acknowledged these results. Electronically Signed   By: Deatra RobinsonKevin  Herman M.D.   On: 02/06/2019 20:47   Ct Abdomen Pelvis W Contrast  Result Date: 02/06/2019 CLINICAL DATA:  Blunt abdominal trauma. EXAM: CT CHEST, ABDOMEN, AND PELVIS WITH  CONTRAST TECHNIQUE: Multidetector CT imaging of the chest, abdomen and pelvis  was performed following the standard protocol during bolus administration of intravenous contrast. CONTRAST:  OMNIPAQUE IOHEXOL 300 MG/ML  SOLN COMPARISON:  CT chest 08/06/2017. FINDINGS: CT CHEST FINDINGS Cardiovascular: Normal heart size. No pericardial effusions. Coronary artery and mitral valve annulus calcifications. Normal caliber thoracic aorta with calcifications. No aortic dissection. Central pulmonary arteries are well opacified without evidence of significant pulmonary embolus. Mediastinum/Nodes: Esophagus is decompressed. No significant lymphadenopathy in the chest. No abnormal mediastinal gas or fluid collections. Lungs/Pleura: Atelectasis in the lung bases. No focal consolidation. No pleural effusions. No pneumothorax. Airways are patent. Musculoskeletal: Postoperative changes with lower cervical anterior fixation and right shoulder arthroplasty. Thoracolumbar scoliosis convex towards the right in the thoracic region and towards the left in the lumbar region. Diffuse degenerative changes throughout the thoracic spine. No vertebral compression deformities. Sternum and ribs appear intact. No acute displaced fractures identified. CT ABDOMEN PELVIS FINDINGS Hepatobiliary: No hepatic injury or perihepatic hematoma. Gallbladder is unremarkable Pancreas: Unremarkable. No pancreatic ductal dilatation or surrounding inflammatory changes. Spleen: No splenic injury or perisplenic hematoma. Adrenals/Urinary Tract: No adrenal hemorrhage or renal injury identified. Bladder is unremarkable. Stomach/Bowel: Stomach, small bowel, and colon are not abnormally distended. Stool fills the colon. No wall thickening or inflammatory changes appreciated. No mesenteric hematoma or gas collections. Appendix is not identified. Vascular/Lymphatic: Aortic atherosclerosis. No enlarged abdominal or pelvic lymph nodes. Reproductive: Uterus and ovaries  are not enlarged. Calcifications in the uterus likely representing calcified fibroids. Other: No free air or free fluid in the abdomen. Abdominal wall musculature appears intact. Musculoskeletal: Thoracolumbar scoliosis. No vertebral compression deformities. Degenerative changes throughout the lumbar spine with narrowed interspaces and endplate hypertrophic changes. Sacrum, pelvis, and hips appear intact. No acute fractures are identified. IMPRESSION: 1. No acute posttraumatic changes demonstrated in the chest, abdomen, or pelvis. 2. Atelectasis in the lung bases. 3. No evidence of mediastinal injury or pulmonary parenchymal injury. 4. No evidence of solid organ injury or bowel perforation. 5. Thoracolumbar scoliosis and degenerative changes. 6. Calcified uterine fibroids. 7. Aortic atherosclerosis. Electronically Signed   By: Burman Nieves M.D.   On: 02/06/2019 20:53   Impression/Plan   78 y.o. female with left frontal intraparenchymal hematoma and mixed subdural and subarachnoid blood bilaterally after a syncopal episode. No evidence of hydrocephalus on CT. She is aphasic but able to follow commands and has a grossly non-focal exam. Imaging reviewed with Dr Conchita Paris. No indication for acute NS intervention. Patient to be admitted under TH. Will obtain MRI/MRA brain to r/o other causes for hemorrhage. May need to pursue full syncope work up pending results.  - Neuro check q 2 hours, report any change - Repeat head CT tomorrow am, depending on time of MRI  Please call for any concerns.

## 2019-02-06 NOTE — ED Notes (Signed)
Admitting remains at bedside and transport is here to take patient to MRI. Patient will be returned to Trauma C.

## 2019-02-07 ENCOUNTER — Observation Stay (HOSPITAL_COMMUNITY): Payer: Medicare Other

## 2019-02-07 ENCOUNTER — Encounter (HOSPITAL_COMMUNITY): Payer: Self-pay | Admitting: Internal Medicine

## 2019-02-07 DIAGNOSIS — I350 Nonrheumatic aortic (valve) stenosis: Secondary | ICD-10-CM | POA: Diagnosis present

## 2019-02-07 DIAGNOSIS — E46 Unspecified protein-calorie malnutrition: Secondary | ICD-10-CM | POA: Diagnosis not present

## 2019-02-07 DIAGNOSIS — S066X9A Traumatic subarachnoid hemorrhage with loss of consciousness of unspecified duration, initial encounter: Secondary | ICD-10-CM | POA: Diagnosis present

## 2019-02-07 DIAGNOSIS — N181 Chronic kidney disease, stage 1: Secondary | ICD-10-CM

## 2019-02-07 DIAGNOSIS — I130 Hypertensive heart and chronic kidney disease with heart failure and stage 1 through stage 4 chronic kidney disease, or unspecified chronic kidney disease: Secondary | ICD-10-CM | POA: Diagnosis present

## 2019-02-07 DIAGNOSIS — S062X9S Diffuse traumatic brain injury with loss of consciousness of unspecified duration, sequela: Secondary | ICD-10-CM | POA: Diagnosis not present

## 2019-02-07 DIAGNOSIS — S06350S Traumatic hemorrhage of left cerebrum without loss of consciousness, sequela: Secondary | ICD-10-CM | POA: Diagnosis not present

## 2019-02-07 DIAGNOSIS — I629 Nontraumatic intracranial hemorrhage, unspecified: Secondary | ICD-10-CM

## 2019-02-07 DIAGNOSIS — D638 Anemia in other chronic diseases classified elsewhere: Secondary | ICD-10-CM | POA: Diagnosis present

## 2019-02-07 DIAGNOSIS — R0902 Hypoxemia: Secondary | ICD-10-CM | POA: Diagnosis present

## 2019-02-07 DIAGNOSIS — H409 Unspecified glaucoma: Secondary | ICD-10-CM | POA: Diagnosis present

## 2019-02-07 DIAGNOSIS — E1149 Type 2 diabetes mellitus with other diabetic neurological complication: Secondary | ICD-10-CM | POA: Diagnosis not present

## 2019-02-07 DIAGNOSIS — N179 Acute kidney failure, unspecified: Secondary | ICD-10-CM | POA: Diagnosis not present

## 2019-02-07 DIAGNOSIS — E872 Acidosis: Secondary | ICD-10-CM | POA: Diagnosis not present

## 2019-02-07 DIAGNOSIS — D72829 Elevated white blood cell count, unspecified: Secondary | ICD-10-CM | POA: Diagnosis not present

## 2019-02-07 DIAGNOSIS — D508 Other iron deficiency anemias: Secondary | ICD-10-CM | POA: Diagnosis not present

## 2019-02-07 DIAGNOSIS — R5383 Other fatigue: Secondary | ICD-10-CM | POA: Diagnosis not present

## 2019-02-07 DIAGNOSIS — R0789 Other chest pain: Secondary | ICD-10-CM | POA: Diagnosis not present

## 2019-02-07 DIAGNOSIS — I5033 Acute on chronic diastolic (congestive) heart failure: Secondary | ICD-10-CM | POA: Diagnosis present

## 2019-02-07 DIAGNOSIS — E785 Hyperlipidemia, unspecified: Secondary | ICD-10-CM | POA: Diagnosis present

## 2019-02-07 DIAGNOSIS — I160 Hypertensive urgency: Secondary | ICD-10-CM | POA: Diagnosis present

## 2019-02-07 DIAGNOSIS — I459 Conduction disorder, unspecified: Secondary | ICD-10-CM | POA: Diagnosis not present

## 2019-02-07 DIAGNOSIS — E870 Hyperosmolality and hypernatremia: Secondary | ICD-10-CM | POA: Diagnosis present

## 2019-02-07 DIAGNOSIS — N390 Urinary tract infection, site not specified: Secondary | ICD-10-CM | POA: Diagnosis not present

## 2019-02-07 DIAGNOSIS — I10 Essential (primary) hypertension: Secondary | ICD-10-CM | POA: Diagnosis not present

## 2019-02-07 DIAGNOSIS — Z950 Presence of cardiac pacemaker: Secondary | ICD-10-CM | POA: Diagnosis not present

## 2019-02-07 DIAGNOSIS — S06340S Traumatic hemorrhage of right cerebrum without loss of consciousness, sequela: Secondary | ICD-10-CM | POA: Diagnosis not present

## 2019-02-07 DIAGNOSIS — S06369A Traumatic hemorrhage of cerebrum, unspecified, with loss of consciousness of unspecified duration, initial encounter: Secondary | ICD-10-CM | POA: Diagnosis present

## 2019-02-07 DIAGNOSIS — E119 Type 2 diabetes mellitus without complications: Secondary | ICD-10-CM | POA: Diagnosis not present

## 2019-02-07 DIAGNOSIS — R0989 Other specified symptoms and signs involving the circulatory and respiratory systems: Secondary | ICD-10-CM | POA: Diagnosis not present

## 2019-02-07 DIAGNOSIS — S2241XD Multiple fractures of ribs, right side, subsequent encounter for fracture with routine healing: Secondary | ICD-10-CM | POA: Diagnosis not present

## 2019-02-07 DIAGNOSIS — N183 Chronic kidney disease, stage 3 (moderate): Secondary | ICD-10-CM | POA: Diagnosis present

## 2019-02-07 DIAGNOSIS — N182 Chronic kidney disease, stage 2 (mild): Secondary | ICD-10-CM | POA: Diagnosis not present

## 2019-02-07 DIAGNOSIS — R001 Bradycardia, unspecified: Secondary | ICD-10-CM | POA: Diagnosis present

## 2019-02-07 DIAGNOSIS — S06340D Traumatic hemorrhage of right cerebrum without loss of consciousness, subsequent encounter: Secondary | ICD-10-CM | POA: Diagnosis not present

## 2019-02-07 DIAGNOSIS — R7309 Other abnormal glucose: Secondary | ICD-10-CM | POA: Diagnosis not present

## 2019-02-07 DIAGNOSIS — E1022 Type 1 diabetes mellitus with diabetic chronic kidney disease: Secondary | ICD-10-CM

## 2019-02-07 DIAGNOSIS — R509 Fever, unspecified: Secondary | ICD-10-CM | POA: Diagnosis not present

## 2019-02-07 DIAGNOSIS — E1043 Type 1 diabetes mellitus with diabetic autonomic (poly)neuropathy: Secondary | ICD-10-CM | POA: Diagnosis present

## 2019-02-07 DIAGNOSIS — K2951 Unspecified chronic gastritis with bleeding: Secondary | ICD-10-CM | POA: Diagnosis not present

## 2019-02-07 DIAGNOSIS — R55 Syncope and collapse: Secondary | ICD-10-CM

## 2019-02-07 DIAGNOSIS — D62 Acute posthemorrhagic anemia: Secondary | ICD-10-CM | POA: Diagnosis not present

## 2019-02-07 DIAGNOSIS — E1142 Type 2 diabetes mellitus with diabetic polyneuropathy: Secondary | ICD-10-CM | POA: Diagnosis not present

## 2019-02-07 DIAGNOSIS — R1312 Dysphagia, oropharyngeal phase: Secondary | ICD-10-CM | POA: Diagnosis not present

## 2019-02-07 DIAGNOSIS — W1830XA Fall on same level, unspecified, initial encounter: Secondary | ICD-10-CM | POA: Diagnosis present

## 2019-02-07 DIAGNOSIS — I169 Hypertensive crisis, unspecified: Secondary | ICD-10-CM | POA: Diagnosis not present

## 2019-02-07 DIAGNOSIS — R1310 Dysphagia, unspecified: Secondary | ICD-10-CM | POA: Diagnosis not present

## 2019-02-07 DIAGNOSIS — Y92531 Health care provider office as the place of occurrence of the external cause: Secondary | ICD-10-CM | POA: Diagnosis not present

## 2019-02-07 DIAGNOSIS — S2241XA Multiple fractures of ribs, right side, initial encounter for closed fracture: Secondary | ICD-10-CM | POA: Diagnosis not present

## 2019-02-07 DIAGNOSIS — E162 Hypoglycemia, unspecified: Secondary | ICD-10-CM | POA: Diagnosis not present

## 2019-02-07 DIAGNOSIS — R4701 Aphasia: Secondary | ICD-10-CM | POA: Diagnosis present

## 2019-02-07 DIAGNOSIS — M810 Age-related osteoporosis without current pathological fracture: Secondary | ICD-10-CM | POA: Diagnosis present

## 2019-02-07 DIAGNOSIS — G936 Cerebral edema: Secondary | ICD-10-CM | POA: Diagnosis present

## 2019-02-07 DIAGNOSIS — S06342S Traumatic hemorrhage of right cerebrum with loss of consciousness of 31 minutes to 59 minutes, sequela: Secondary | ICD-10-CM | POA: Diagnosis not present

## 2019-02-07 DIAGNOSIS — S06359D Traumatic hemorrhage of left cerebrum with loss of consciousness of unspecified duration, subsequent encounter: Secondary | ICD-10-CM | POA: Diagnosis not present

## 2019-02-07 DIAGNOSIS — I442 Atrioventricular block, complete: Secondary | ICD-10-CM | POA: Diagnosis present

## 2019-02-07 DIAGNOSIS — E1165 Type 2 diabetes mellitus with hyperglycemia: Secondary | ICD-10-CM | POA: Diagnosis not present

## 2019-02-07 DIAGNOSIS — S06350A Traumatic hemorrhage of left cerebrum without loss of consciousness, initial encounter: Secondary | ICD-10-CM | POA: Diagnosis not present

## 2019-02-07 DIAGNOSIS — J90 Pleural effusion, not elsewhere classified: Secondary | ICD-10-CM | POA: Diagnosis not present

## 2019-02-07 DIAGNOSIS — S0093XA Contusion of unspecified part of head, initial encounter: Secondary | ICD-10-CM | POA: Diagnosis present

## 2019-02-07 DIAGNOSIS — E876 Hypokalemia: Secondary | ICD-10-CM | POA: Diagnosis not present

## 2019-02-07 DIAGNOSIS — I619 Nontraumatic intracerebral hemorrhage, unspecified: Secondary | ICD-10-CM | POA: Diagnosis present

## 2019-02-07 LAB — ABO/RH: ABO/RH(D): A POS

## 2019-02-07 LAB — BASIC METABOLIC PANEL
Anion gap: 18 — ABNORMAL HIGH (ref 5–15)
BUN: 19 mg/dL (ref 8–23)
CO2: 26 mmol/L (ref 22–32)
Calcium: 9.5 mg/dL (ref 8.9–10.3)
Chloride: 93 mmol/L — ABNORMAL LOW (ref 98–111)
Creatinine, Ser: 0.9 mg/dL (ref 0.44–1.00)
GFR calc Af Amer: >60 mL/min (ref >60)
GFR calc non Af Amer: >60 mL/min (ref >60)
Glucose, Bld: 290 mg/dL — ABNORMAL HIGH (ref 70–99)
Potassium: 4.3 mmol/L (ref 3.5–5.1)
Sodium: 137 mmol/L (ref 135–145)

## 2019-02-07 LAB — MRSA PCR SCREENING: MRSA by PCR: NEGATIVE

## 2019-02-07 LAB — CBC
HCT: 35.2 % — ABNORMAL LOW (ref 36.0–46.0)
HCT: 32.7 % — ABNORMAL LOW (ref 36.0–46.0)
Hemoglobin: 11.5 g/dL — ABNORMAL LOW (ref 12.0–15.0)
Hemoglobin: 11 g/dL — ABNORMAL LOW (ref 12.0–15.0)
MCH: 29.4 pg (ref 26.0–34.0)
MCH: 30.2 pg (ref 26.0–34.0)
MCHC: 32.7 g/dL (ref 30.0–36.0)
MCHC: 33.6 g/dL (ref 30.0–36.0)
MCV: 90 fL (ref 80.0–100.0)
MCV: 89.8 fL (ref 80.0–100.0)
Platelets: 268 10*3/uL (ref 150–400)
Platelets: 267 10*3/uL (ref 150–400)
RBC: 3.91 MIL/uL (ref 3.87–5.11)
RBC: 3.64 MIL/uL — ABNORMAL LOW (ref 3.87–5.11)
RDW: 13.4 % (ref 11.5–15.5)
RDW: 13.7 % (ref 11.5–15.5)
WBC: 12.9 10*3/uL — ABNORMAL HIGH (ref 4.0–10.5)
WBC: 17 10*3/uL — ABNORMAL HIGH (ref 4.0–10.5)
nRBC: 0 % (ref 0.0–0.2)
nRBC: 0 % (ref 0.0–0.2)

## 2019-02-07 LAB — GLUCOSE, CAPILLARY
Glucose-Capillary: 231 mg/dL — ABNORMAL HIGH (ref 70–99)
Glucose-Capillary: 299 mg/dL — ABNORMAL HIGH (ref 70–99)
Glucose-Capillary: 261 mg/dL — ABNORMAL HIGH (ref 70–99)
Glucose-Capillary: 257 mg/dL — ABNORMAL HIGH (ref 70–99)
Glucose-Capillary: 181 mg/dL — ABNORMAL HIGH (ref 70–99)
Glucose-Capillary: 156 mg/dL — ABNORMAL HIGH (ref 70–99)

## 2019-02-07 LAB — URINALYSIS, ROUTINE W REFLEX MICROSCOPIC
Bilirubin Urine: NEGATIVE
Glucose, UA: >=500 mg/dL — AB
Hgb urine dipstick: NEGATIVE
Ketones, ur: 20 mg/dL — AB
Leukocytes,Ua: NEGATIVE
Nitrite: NEGATIVE
Protein, ur: NEGATIVE mg/dL
Specific Gravity, Urine: 1.021 (ref 1.005–1.030)
pH: 8 (ref 5.0–8.0)

## 2019-02-07 LAB — TROPONIN I
Troponin I: <0.03 ng/mL (ref <0.03)
Troponin I: <0.03 ng/mL (ref <0.03)
Troponin I: <0.03 ng/mL (ref <0.03)

## 2019-02-07 LAB — MAGNESIUM: Magnesium: 1.9 mg/dL (ref 1.7–2.4)

## 2019-02-07 LAB — HEPATIC FUNCTION PANEL
ALT: 25 U/L (ref 0–44)
AST: 32 U/L (ref 15–41)
Albumin: 3.7 g/dL (ref 3.5–5.0)
Alkaline Phosphatase: 68 U/L (ref 38–126)
Bilirubin, Direct: 0.1 mg/dL (ref 0.0–0.2)
Indirect Bilirubin: 0.5 mg/dL (ref 0.3–0.9)
Total Bilirubin: 0.6 mg/dL (ref 0.3–1.2)
Total Protein: 6.7 g/dL (ref 6.5–8.1)

## 2019-02-07 LAB — ECHOCARDIOGRAM COMPLETE
Height: 60 in
Weight: 1749.57 oz

## 2019-02-07 LAB — PROTIME-INR
INR: 0.9 (ref 0.8–1.2)
Prothrombin Time: 12.5 seconds (ref 11.4–15.2)

## 2019-02-07 LAB — TYPE AND SCREEN
ABO/RH(D): A POS
Antibody Screen: NEGATIVE

## 2019-02-07 MED ORDER — ATROPINE SULFATE 1 MG/10ML IJ SOSY
PREFILLED_SYRINGE | INTRAMUSCULAR | Status: AC
  Administered 2019-02-07: 1 mg
  Filled 2019-02-07: qty 10

## 2019-02-07 MED ORDER — SODIUM CHLORIDE 0.9 % IV SOLN
80.0000 mg | Freq: Once | INTRAVENOUS | Status: AC
  Administered 2019-02-07: 80 mg via INTRAVENOUS
  Filled 2019-02-07: qty 80

## 2019-02-07 MED ORDER — PANTOPRAZOLE SODIUM 40 MG IV SOLR
40.0000 mg | Freq: Two times a day (BID) | INTRAVENOUS | Status: DC
  Administered 2019-02-10 – 2019-02-13 (×6): 40 mg via INTRAVENOUS
  Filled 2019-02-07 (×6): qty 40

## 2019-02-07 MED ORDER — SIMVASTATIN 20 MG PO TABS: 40.0000 mg | ORAL_TABLET | Freq: Every day | ORAL | Status: DC

## 2019-02-07 MED ORDER — STROKE: EARLY STAGES OF RECOVERY BOOK
Freq: Once | Status: AC
  Administered 2019-02-09: 11:00:00

## 2019-02-07 MED ORDER — ONDANSETRON HCL 4 MG/2ML IJ SOLN
4.0000 mg | Freq: Once | INTRAMUSCULAR | Status: AC
  Administered 2019-02-07: 4 mg via INTRAVENOUS

## 2019-02-07 MED ORDER — LABETALOL HCL 5 MG/ML IV SOLN
INTRAVENOUS | Status: AC
  Filled 2019-02-07: qty 4

## 2019-02-07 MED ORDER — LABETALOL HCL 5 MG/ML IV SOLN: 10.0000 mg | INTRAVENOUS | Status: DC | PRN

## 2019-02-07 MED ORDER — DEXMEDETOMIDINE HCL IN NACL 400 MCG/100ML IV SOLN: 0.4000 ug/kg/h | INTRAVENOUS | Status: DC

## 2019-02-07 MED ORDER — ACETAMINOPHEN 325 MG PO TABS: 650.0000 mg | ORAL_TABLET | Freq: Four times a day (QID) | ORAL | Status: DC | PRN

## 2019-02-07 MED ORDER — ONDANSETRON HCL 4 MG PO TABS: 4.0000 mg | ORAL_TABLET | Freq: Four times a day (QID) | ORAL | Status: DC | PRN

## 2019-02-07 MED ORDER — ONDANSETRON HCL 4 MG/2ML IJ SOLN
4.0000 mg | Freq: Four times a day (QID) | INTRAMUSCULAR | Status: DC | PRN
  Administered 2019-02-07 (×2): 4 mg via INTRAVENOUS
  Filled 2019-02-07 (×3): qty 2

## 2019-02-07 MED ORDER — INSULIN GLARGINE 100 UNIT/ML ~~LOC~~ SOLN
10.0000 [IU] | Freq: Every day | SUBCUTANEOUS | Status: DC
  Filled 2019-02-07: qty 0.1

## 2019-02-07 MED ORDER — SODIUM CHLORIDE 0.9% IV SOLUTION: Freq: Once | INTRAVENOUS | Status: DC

## 2019-02-07 MED ORDER — NICARDIPINE HCL IN NACL 20-0.86 MG/200ML-% IV SOLN
3.0000 mg/h | INTRAVENOUS | Status: DC
  Administered 2019-02-07 – 2019-02-09 (×2): 3 mg/h via INTRAVENOUS
  Administered 2019-02-09: 1 mg/h via INTRAVENOUS
  Administered 2019-02-10: 2 mg/h via INTRAVENOUS
  Filled 2019-02-07 (×7): qty 200

## 2019-02-07 MED ORDER — ACETAMINOPHEN 650 MG RE SUPP: 650.0000 mg | Freq: Four times a day (QID) | RECTAL | Status: DC | PRN

## 2019-02-07 MED ORDER — INSULIN GLARGINE 100 UNIT/ML ~~LOC~~ SOLN
8.0000 [IU] | Freq: Every day | SUBCUTANEOUS | Status: DC
  Administered 2019-02-07 – 2019-02-08 (×2): 8 [IU] via SUBCUTANEOUS
  Filled 2019-02-07 (×2): qty 0.08

## 2019-02-07 MED ORDER — INSULIN ASPART 100 UNIT/ML ~~LOC~~ SOLN
0.0000 [IU] | SUBCUTANEOUS | Status: DC
  Administered 2019-02-07: 3 [IU] via SUBCUTANEOUS
  Administered 2019-02-07 (×2): 5 [IU] via SUBCUTANEOUS
  Administered 2019-02-07: 2 [IU] via SUBCUTANEOUS
  Administered 2019-02-07: 5 [IU] via SUBCUTANEOUS
  Administered 2019-02-08: 2 [IU] via SUBCUTANEOUS
  Administered 2019-02-08: 5 [IU] via SUBCUTANEOUS
  Administered 2019-02-08: 2 [IU] via SUBCUTANEOUS
  Administered 2019-02-08: 3 [IU] via SUBCUTANEOUS
  Administered 2019-02-08: 1 [IU] via SUBCUTANEOUS
  Administered 2019-02-09 (×2): 2 [IU] via SUBCUTANEOUS
  Administered 2019-02-09: 3 [IU] via SUBCUTANEOUS
  Administered 2019-02-09: 2 [IU] via SUBCUTANEOUS
  Administered 2019-02-10: 21:00:00 5 [IU] via SUBCUTANEOUS
  Administered 2019-02-10 (×2): 1 [IU] via SUBCUTANEOUS
  Administered 2019-02-10 – 2019-02-11 (×2): 3 [IU] via SUBCUTANEOUS
  Administered 2019-02-11 (×3): 7 [IU] via SUBCUTANEOUS

## 2019-02-07 MED ORDER — METOCLOPRAMIDE HCL 5 MG/ML IJ SOLN
5.0000 mg | Freq: Three times a day (TID) | INTRAMUSCULAR | Status: DC | PRN
  Administered 2019-02-07 (×2): 5 mg via INTRAVENOUS
  Filled 2019-02-07 (×2): qty 2

## 2019-02-07 MED ORDER — SODIUM CHLORIDE 0.9 % IV SOLN
8.0000 mg/h | INTRAVENOUS | Status: DC
  Administered 2019-02-07 – 2019-02-09 (×6): 8 mg/h via INTRAVENOUS
  Filled 2019-02-07 (×8): qty 80

## 2019-02-07 MED ORDER — GADOBUTROL 1 MMOL/ML IV SOLN
5.0000 mL/kg | Freq: Once | INTRAVENOUS | Status: AC | PRN
  Administered 2019-02-07: 247 mL via INTRAVENOUS

## 2019-02-07 MED ORDER — LABETALOL HCL 5 MG/ML IV SOLN
20.0000 mg | INTRAVENOUS | Status: DC | PRN
  Administered 2019-02-07: 10 mg via INTRAVENOUS

## 2019-02-07 NOTE — Progress Notes (Signed)
RTX2 attempted to place Aline, attempts were unsuccessful. RN aware

## 2019-02-07 NOTE — Progress Notes (Signed)
OT Cancellation Note  Patient Details Name: PENELOPEROSE SODERMAN MRN: [id number] DOB: 1941-02-10   Cancelled Treatment:    Reason Eval/Treat Not Completed: Active bedrest order  [facility] Schneider [place]ol, OT/L   Acute OT Clinical Specialist Acute Rehabilitation Services Pager [phone] Office [phone]  02/07/2019, 10:49 AM

## 2019-02-07 NOTE — Progress Notes (Signed)
NAME:  Danielle Clarke, MRN:  [id number], DOB:  09-08-1941, LOS: 0 ADMISSION DATE:  02/06/2019, CONSULTATION DATE:  02/07/2019 REFERRING MD:  Toniann Fail, CHIEF COMPLAINT:  Intracranial hemorrhage  HPI/course in hospital  78 year old with syncopal episode after dental visit with closed head trauma.  No operative intervention planned at this time given mild symptoms of dysarthria.  Admitted to PCCM for strict BP control.   Prior history of syncope, deemed related to swallowing in the past.   Past Medical History   Past Medical History:  Diagnosis Date  . Aortic stenosis, mild   . Arthritis   . Asthmatic bronchitis    with colds per patient  . Carotid artery disease (HCC)    40-59% bilateral ICA stenosis  . Cataract   . Cutaneous abscess of right foot   . Glaucoma   . Heart murmur    Dr Katrinka Blazing is her  cardiologist.   . Hyperlipemia   . Hypertension    Dr. Katrinka Blazing ~ 2 years ago  . Neck fracture [facility])    july 2013  . Osteoporosis   . Syncope   . Type 1 diabetes mellitus (HCC)      Past Surgical History:  Procedure Laterality Date  . ANKLE FRACTURE SURGERY Left 2001   steel plate and 3 screws   . ANTERIOR CERVICAL CORPECTOMY N/A 07/31/2014   Procedure: Cervical Four to Cervical Six Corpectomy;  Surgeon: Karn Cassis, MD;  Location: [facility];  Service: Neurosurgery;  Laterality: N/A;  C4 to C6 Corpectomy  . BREAST SURGERY  1988  . CATARACT EXTRACTION W/ INTRAOCULAR LENS  IMPLANT, BILATERAL Bilateral 2011   right and then left  . EYE SURGERY    . FRACTURE SURGERY    . HAMMER TOE SURGERY  1998  . I&D EXTREMITY Right 09/27/2018   Procedure: IRRIGATION AND DEBRIDEMENT RIGHT FOOT;  Surgeon: Nadara Mustard, MD;  Location: [facility] OR;  Service: Orthopedics;  Laterality: Right;  . JOINT REPLACEMENT    . TONSILLECTOMY AND ADENOIDECTOMY  1948  . TOTAL SHOULDER REPLACEMENT  2010   right shoulder   . TUBAL LIGATION  1979  . TYMPANOSTOMY TUBE PLACEMENT Bilateral     Interim  history/subjective:  Continue to have episodes of emesis associated with periods of complete heart block.    Objective   Blood pressure (!) 151/62, pulse 90, temperature (!) 100.8 F (38.2 C), temperature source Axillary, resp. rate (!) 27, height 5' (1.524 m), weight 49.6 kg, SpO2 95 %.        Intake/Output Summary (Last 24 hours) at 02/07/2019 0957 Last data filed at 02/07/2019 0700 Gross per 24 hour  Intake 555.6 ml  Output -  Net 555.6 ml   Filed Weights   02/06/19 1747 02/07/19 0353  Weight: 49.4 kg 49.6 kg    Examination: Physical Exam  Constitutional: She appears listless.  Frail appearing, moving constantly but purposefully  HENT:  Head: Normocephalic and atraumatic.  Eyes: EOM are normal.  Dilated left pupil.  Neck: No tracheal deviation present.  Cardiovascular: Normal heart sounds.  NSR with periods of complete heart block associated with emesis.   Respiratory: Effort normal.  GI: Soft. She exhibits no distension.  Neurological: She has normal strength. She appears listless. No cranial nerve deficit.  Reflex Scores:      Patellar reflexes are 2+ on the right side and 2+ on the left side.      Achilles reflexes are 0 on the right side  and 0 on the left side. Awake and moving all limbs symmetrically. Not following commands and unable to open eyes.   Skin: No erythema.     Ancillary tests (personally reviewed)  CBC: Recent Labs  Lab 02/06/19 1957 02/07/19 0439  WBC 11.4* 12.9*  NEUTROABS 9.4*  --   HGB 11.4* 11.5*  HCT 35.9* 35.2*  MCV 92.1 90.0  PLT 273 268    Basic Metabolic Panel: Recent Labs  Lab 02/06/19 1957 02/06/19 2004 02/07/19 0439  NA 135  --  137  K 4.1  --  4.3  CL 98  --  93*  CO2 29  --  26  GLUCOSE 173*  --  290*  BUN 20  --  19  CREATININE 0.84 0.80 0.90  CALCIUM 8.8*  --  9.5  MG  --   --  1.9   GFR: Estimated Creatinine Clearance: 38.2 mL/min (by C-G formula based on SCr of 0.9 mg/dL). Recent Labs  Lab 02/06/19 1957  02/07/19 0439  WBC 11.4* 12.9*    Liver Function Tests: Recent Labs  Lab 02/06/19 1957 02/07/19 0439  AST 39 32  ALT 24 25  ALKPHOS 70 68  BILITOT 0.5 0.6  PROT 5.9* 6.7  ALBUMIN 3.6 3.7   No results for input(s): LIPASE, AMYLASE in the last 168 hours. No results for input(s): AMMONIA in the last 168 hours.  ABG    Component Value Date/Time   TCO2 30 12/18/2009 2224     Coagulation Profile: Recent Labs  Lab 02/07/19 0238  INR 0.9    Cardiac Enzymes: Recent Labs  Lab 02/07/19 0439 02/07/19 0849  TROPONINI <0.03 <0.03    HbA1C: Hgb A1c MFr Bld  Date/Time Value Ref Range Status  01/04/2017 06:21 PM 7.9 (H) 4.8 - 5.6 % Final    Comment:    (NOTE)         Pre-diabetes: 5.7 - 6.4         Diabetes: >6.4         Glycemic control for adults with diabetes: <7.0     CBG: Recent Labs  Lab 02/06/19 1924 02/06/19 2314 02/07/19 0320 02/07/19 0827  GLUCAP 151* 192* 231* 299*   CT head 3/13 and MRI brain 3/12: left frontal IPH with SAH and minimal/DAI.  (personally reviewed)  Assessment & Plan:   Critically ill due to acute ICH requiring titration of clevidipine to prevent re-bleeding. DM 1 for 63 years Recurrent syncope with complete HB with vagal stimulation.  Consider event monitor at discharge. Emesis of coffee grounds. Possible PUD vs Mallory-Weiss tear. 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  Autonomic neuropathy might also explain syncopal episodes  PLAN:  Continue to titrate clevidipine to keep SBP 110-140 Platelet transfusion to correct ASA effect. GI consultation CBC Start PPI IV Continue antiemetics.  Basal bolus insulin coverage  Best practice:  Diet: NPO - hold on feeding tube for now given vomiting. Pain/Anxiety/Delirium protocol (if indicated): none required  VAP protocol (if indicated): n/a DVT prophylaxis: SCD GI prophylaxis: PPI Urinary catheter: Assessment of intravascular volume Glucose control: Basal  with correction scale while NPO, will add meal coverage once being fed. Mobility: Bedrest with restrains for safety. High fall risk Code Status: Full  Family Communication: Daughter updated at bedside. Disposition: ICU   Critical care time: 40 min including chart data review, examination of patient, multidisciplinary rounds, and frequent assessment and modification of antihypertensive therapy and coordination of care.     Lynnell Catalanavi Semir Brill, MD  [facility] ICU Physician [facility]  Pager: [phone] Mobile: [phone] After hours: [phone].  02/07/2019, 9:57 AM

## 2019-02-07 NOTE — ED Notes (Signed)
ED TO INPATIENT HANDOFF REPORT  ED Nurse Name and Phone #: Delorise Jackson [phone]  S Name/Age/Gender Danielle Clarke 78 y.o. female Room/Bed: TRACC/TRACC  Code Status   Code Status: Prior  Home/SNF/Other Home Patient oriented to: self Is this baseline? Yes   Triage Complete: Triage complete  Chief Complaint Syncope  Triage Note [facility] reports pt was checking out at the dentist after a routine cleaning when she had a syncopal episode and fell backwards striking her head. She also complains of right rib pain and lower back pain. Pt was pale and clammy, with a 211 cgb. She is not on blood thinners. [facility] was concerned about the t waves on the 12 lead.    Allergies Allergies  Allergen Reactions  . Cefaclor Anaphylaxis  . Tetracycline Anaphylaxis  . Vancomycin Anaphylaxis  . Augmentin [Amoxicillin-Pot Clavulanate] Other (See Comments)    Reaction unknown >> Anaphylaxis Has patient had a PCN reaction causing immediate rash, facial/tongue/throat swelling, SOB or lightheadedness with hypotension: Unknown Has patient had a PCN reaction causing severe rash involving mucus membranes or skin necrosis: Unknown Has patient had a PCN reaction that required hospitalization: Unknown Has patient had a PCN reaction occurring within the last 10 years: Unknown If all of the above answers are "NO", then may proceed with Cephalosporin use.   . Prednisone Other (See Comments)    Reaction unknown    Level of Care/Admitting Diagnosis ED Disposition    ED Disposition Condition Comment   Admit  Hospital Area: [facility] [[id number]]  Level of Care: Progressive [102]  I expect the patient will be discharged within 24 hours: No (not a candidate for 5C-Observation unit)  Diagnosis: Intracranial bleeding [facility]) [[id number]]  Admitting Physician: Eduard Clos [phone]  Attending Physician: Eduard Clos [[id number]]  PT Class (Do Not Modify): Observation [104]  PT Acc Code (Do Not Modify):  Observation [10022]       B Medical/Surgery History Past Medical History:  Diagnosis Date  . Arthritis   . Asthmatic bronchitis    with colds per patient  . Cataract   . Cutaneous abscess of right foot   . Glaucoma   . Heart murmur    Dr Katrinka Blazing is her  cardiologist.   . Hyperlipemia   . Hypertension    Dr. Katrinka Blazing ~ 2 years ago  . Neck fracture [facility])    july 2013  . Osteoporosis   . Type 1 diabetes mellitus (HCC)    Past Surgical History:  Procedure Laterality Date  . ANKLE FRACTURE SURGERY Left 2001   steel plate and 3 screws   . ANTERIOR CERVICAL CORPECTOMY N/A 07/31/2014   Procedure: Cervical Four to Cervical Six Corpectomy;  Surgeon: Karn Cassis, MD;  Location: [facility];  Service: Neurosurgery;  Laterality: N/A;  C4 to C6 Corpectomy  . BREAST SURGERY  1988  . CATARACT EXTRACTION W/ INTRAOCULAR LENS  IMPLANT, BILATERAL Bilateral 2011   right and then left  . EYE SURGERY    . FRACTURE SURGERY    . HAMMER TOE SURGERY  1998  . I&D EXTREMITY Right 09/27/2018   Procedure: IRRIGATION AND DEBRIDEMENT RIGHT FOOT;  Surgeon: Nadara Mustard, MD;  Location: [facility] OR;  Service: Orthopedics;  Laterality: Right;  . JOINT REPLACEMENT    . TONSILLECTOMY AND ADENOIDECTOMY  1948  . TOTAL SHOULDER REPLACEMENT  2010   right shoulder   . TUBAL LIGATION  1979  . TYMPANOSTOMY TUBE PLACEMENT Bilateral  A IV Location/Drains/Wounds Patient Lines/Drains/Airways Status   Active Line/Drains/Airways    Name:   Placement date:   Placement time:   Site:   Days:   Peripheral IV 02/06/19 Right Antecubital   02/06/19    1741    Antecubital   1   Incision (Closed) 07/31/14 Neck Left   07/31/14    1150     1652   Incision (Closed) 09/27/18 Leg Right   09/27/18    1026     133          Intake/Output Last 24 hours  Intake/Output Summary (Last 24 hours) at 02/07/2019 0011 Last data filed at 02/06/2019 2317 Gross per 24 hour  Intake 500 ml  Output -  Net 500 ml     Labs/Imaging Results for orders placed or performed during the hospital encounter of 02/06/19 (from the past 48 hour(s))  CBG monitoring, ED     Status: Abnormal   Collection Time: 02/06/19  7:24 PM  Result Value Ref Range   Glucose-Capillary 151 (H) 70 - 99 mg/dL  Comprehensive metabolic panel     Status: Abnormal   Collection Time: 02/06/19  7:57 PM  Result Value Ref Range   Sodium 135 135 - 145 mmol/L   Potassium 4.1 3.5 - 5.1 mmol/L   Chloride 98 98 - 111 mmol/L   CO2 29 22 - 32 mmol/L   Glucose, Bld 173 (H) 70 - 99 mg/dL   BUN 20 8 - 23 mg/dL   Creatinine, Ser 2.99 0.44 - 1.00 mg/dL   Calcium 8.8 (L) 8.9 - 10.3 mg/dL   Total Protein 5.9 (L) 6.5 - 8.1 g/dL   Albumin 3.6 3.5 - 5.0 g/dL   AST 39 15 - 41 U/L   ALT 24 0 - 44 U/L   Alkaline Phosphatase 70 38 - 126 U/L   Total Bilirubin 0.5 0.3 - 1.2 mg/dL   GFR calc non Af Amer >60 >60 mL/min   GFR calc Af Amer >60 >60 mL/min   Anion gap 8 5 - 15    Comment: Performed at [facility], [address], [place], [place] [address]  CBC with Differential     Status: Abnormal   Collection Time: 02/06/19  7:57 PM  Result Value Ref Range   WBC 11.4 (H) 4.0 - 10.5 K/uL   RBC 3.90 3.87 - 5.11 MIL/uL   Hemoglobin 11.4 (L) 12.0 - 15.0 g/dL   HCT 34.1 (L) 96.2 - 22.9 %   MCV 92.1 80.0 - 100.0 fL   MCH 29.2 26.0 - 34.0 pg   MCHC 31.8 30.0 - 36.0 g/dL   RDW 79.8 92.1 - 19.4 %   Platelets 273 150 - 400 K/uL   nRBC 0.0 0.0 - 0.2 %   Neutrophils Relative % 80 %   Neutro Abs 9.4 (H) 1.7 - 7.7 K/uL   Lymphocytes Relative 10 %   Lymphs Abs 1.1 0.7 - 4.0 K/uL   Monocytes Relative 7 %   Monocytes Absolute 0.8 0.1 - 1.0 K/uL   Eosinophils Relative 1 %   Eosinophils Absolute 0.1 0.0 - 0.5 K/uL   Basophils Relative 1 %   Basophils Absolute 0.1 0.0 - 0.1 K/uL   Immature Granulocytes 1 %   Abs Immature Granulocytes 0.06 0.00 - 0.07 K/uL    Comment: Performed at [facility], [address], [place], [place] [address]  I-stat  Creatinine, ED     Status: None  Collection Time: 02/06/19  8:04 PM  Result Value Ref Range   Creatinine, Ser 0.80 0.44 - 1.00 mg/dL  CBG monitoring, ED     Status: Abnormal   Collection Time: 02/06/19 11:14 PM  Result Value Ref Range   Glucose-Capillary 192 (H) 70 - 99 mg/dL   Ct Head Wo Contrast  Result Date: 02/06/2019 CLINICAL DATA:  Fall EXAM: CT HEAD WITHOUT CONTRAST CT CERVICAL SPINE WITHOUT CONTRAST TECHNIQUE: Multidetector CT imaging of the head and cervical spine was performed following the standard protocol without intravenous contrast. Multiplanar CT image reconstructions of the cervical spine were also generated. COMPARISON:  Head CT 01/04/2017 FINDINGS: CT HEAD FINDINGS Brain: There is a left frontal intraparenchymal hematoma measuring 2.2 x 2.2 x 3.9 cm (volume = 9.9 cm^3) with moderate surrounding edema. There is mixed subdural and subarachnoid blood over the left convexity, extending into the sylvian fissure. Smaller amount of subarachnoid blood the right sylvian fissure. No midline shift or other significant mass effect. No hydrocephalus. Vascular: No abnormal hyperdensity of the major intracranial arteries or dural venous sinuses. No intracranial atherosclerosis. Skull: Large right parietal posterior scalp hematoma. Sinuses/Orbits: No fluid levels or advanced mucosal thickening of the visualized paranasal sinuses. No mastoid or middle ear effusion. The orbits are normal. CT CERVICAL SPINE FINDINGS Alignment: No static subluxation. Facets are aligned. Occipital condyles are normally positioned. Skull base and vertebrae: There is an old type 2 fracture of the dens with chronic nonunion. There is there is C3-C6 anterior fusion with solid anterior fusion mass. No acute fracture. Soft tissues and spinal canal: No prevertebral fluid or swelling. No visible canal hematoma. Disc levels: No bony spinal canal stenosis. Upper chest: No pneumothorax, pulmonary nodule or pleural effusion. Other:  Normal visualized paraspinal cervical soft tissues. IMPRESSION: 1. Left frontal intraparenchymal hematoma with volume approximately 10 mL and mild surrounding edema with small volume subarachnoid/subdural blood over both convexities, left-greater-than-right. 2. Posterior right parietal scalp hematoma, consistent with a contrecoup pattern of injury. 3. No acute fracture or static subluxation of the cervical spine. 4. 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 Critical Value/emergent results were called by telephone at the time of interpretation on 02/06/2019 at 8:41 pm to Dr. Mancel Bale , who verbally acknowledged these results. Electronically Signed   By: Deatra Robinson M.D.   On: 02/06/2019 20:47   Ct Chest W Contrast  Result Date: 02/06/2019 CLINICAL DATA:  Blunt abdominal trauma. EXAM: CT CHEST, ABDOMEN, AND PELVIS WITH CONTRAST TECHNIQUE: Multidetector CT imaging of the chest, abdomen and pelvis was performed following the standard protocol during bolus administration of intravenous contrast. CONTRAST:  OMNIPAQUE IOHEXOL 300 MG/ML  SOLN COMPARISON:  CT chest 08/06/2017. FINDINGS: CT CHEST FINDINGS Cardiovascular: Normal heart size. No pericardial effusions. Coronary artery and mitral valve annulus calcifications. Normal caliber thoracic aorta with calcifications. No aortic dissection. Central pulmonary arteries are well opacified without evidence of significant pulmonary embolus. Mediastinum/Nodes: Esophagus is decompressed. No significant lymphadenopathy in the chest. No abnormal mediastinal gas or fluid collections. Lungs/Pleura: Atelectasis in the lung bases. No focal consolidation. No pleural effusions. No pneumothorax. Airways are patent. Musculoskeletal: Postoperative changes with lower cervical anterior fixation and right shoulder arthroplasty. Thoracolumbar scoliosis convex towards the right in the thoracic region and towards the left in the lumbar region. Diffuse degenerative  changes throughout the thoracic spine. No vertebral compression deformities. Sternum and ribs appear intact. No acute displaced fractures identified. CT ABDOMEN PELVIS FINDINGS Hepatobiliary: No hepatic injury or perihepatic hematoma. Gallbladder  is unremarkable Pancreas: Unremarkable. No pancreatic ductal dilatation or surrounding inflammatory changes. Spleen: No splenic injury or perisplenic hematoma. Adrenals/Urinary Tract: No adrenal hemorrhage or renal injury identified. Bladder is unremarkable. Stomach/Bowel: Stomach, small bowel, and colon are not abnormally distended. Stool fills the colon. No wall thickening or inflammatory changes appreciated. No mesenteric hematoma or gas collections. Appendix is not identified. Vascular/Lymphatic: Aortic atherosclerosis. No enlarged abdominal or pelvic lymph nodes. Reproductive: Uterus and ovaries are not enlarged. Calcifications in the uterus likely representing calcified fibroids. Other: No free air or free fluid in the abdomen. Abdominal wall musculature appears intact. Musculoskeletal: Thoracolumbar scoliosis. No vertebral compression deformities. Degenerative changes throughout the lumbar spine with narrowed interspaces and endplate hypertrophic changes. Sacrum, pelvis, and hips appear intact. No acute fractures are identified. IMPRESSION: 1. No acute posttraumatic changes demonstrated in the chest, abdomen, or pelvis. 2. Atelectasis in the lung bases. 3. No evidence of mediastinal injury or pulmonary parenchymal injury. 4. No evidence of solid organ injury or bowel perforation. 5. Thoracolumbar scoliosis and degenerative changes. 6. Calcified uterine fibroids. 7. Aortic atherosclerosis. Electronically Signed   By: Burman Nieves M.D.   On: 02/06/2019 20:53   Ct Cervical Spine Wo Contrast  Result Date: 02/06/2019 CLINICAL DATA:  Fall EXAM: CT HEAD WITHOUT CONTRAST CT CERVICAL SPINE WITHOUT CONTRAST TECHNIQUE: Multidetector CT imaging of the head and cervical  spine was performed following the standard protocol without intravenous contrast. Multiplanar CT image reconstructions of the cervical spine were also generated. COMPARISON:  Head CT 01/04/2017 FINDINGS: CT HEAD FINDINGS Brain: There is a left frontal intraparenchymal hematoma measuring 2.2 x 2.2 x 3.9 cm (volume = 9.9 cm^3) with moderate surrounding edema. There is mixed subdural and subarachnoid blood over the left convexity, extending into the sylvian fissure. Smaller amount of subarachnoid blood the right sylvian fissure. No midline shift or other significant mass effect. No hydrocephalus. Vascular: No abnormal hyperdensity of the major intracranial arteries or dural venous sinuses. No intracranial atherosclerosis. Skull: Large right parietal posterior scalp hematoma. Sinuses/Orbits: No fluid levels or advanced mucosal thickening of the visualized paranasal sinuses. No mastoid or middle ear effusion. The orbits are normal. CT CERVICAL SPINE FINDINGS Alignment: No static subluxation. Facets are aligned. Occipital condyles are normally positioned. Skull base and vertebrae: There is an old type 2 fracture of the dens with chronic nonunion. There is there is C3-C6 anterior fusion with solid anterior fusion mass. No acute fracture. Soft tissues and spinal canal: No prevertebral fluid or swelling. No visible canal hematoma. Disc levels: No bony spinal canal stenosis. Upper chest: No pneumothorax, pulmonary nodule or pleural effusion. Other: Normal visualized paraspinal cervical soft tissues. IMPRESSION: 1. Left frontal intraparenchymal hematoma with volume approximately 10 mL and mild surrounding edema with small volume subarachnoid/subdural blood over both convexities, left-greater-than-right. 2. Posterior right parietal scalp hematoma, consistent with a contrecoup pattern of injury. 3. No acute fracture or static subluxation of the cervical spine. 4. 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  Critical Value/emergent results were called by telephone at the time of interpretation on 02/06/2019 at 8:41 pm to Dr. Mancel Bale , who verbally acknowledged these results. Electronically Signed   By: Deatra Robinson M.D.   On: 02/06/2019 20:47   Ct Abdomen Pelvis W Contrast  Result Date: 02/06/2019 CLINICAL DATA:  Blunt abdominal trauma. EXAM: CT CHEST, ABDOMEN, AND PELVIS WITH CONTRAST TECHNIQUE: Multidetector CT imaging of the chest, abdomen and pelvis was performed following the standard protocol during bolus administration of intravenous contrast.  CONTRAST:  OMNIPAQUE IOHEXOL 300 MG/ML  SOLN COMPARISON:  CT chest 08/06/2017. FINDINGS: CT CHEST FINDINGS Cardiovascular: Normal heart size. No pericardial effusions. Coronary artery and mitral valve annulus calcifications. Normal caliber thoracic aorta with calcifications. No aortic dissection. Central pulmonary arteries are well opacified without evidence of significant pulmonary embolus. Mediastinum/Nodes: Esophagus is decompressed. No significant lymphadenopathy in the chest. No abnormal mediastinal gas or fluid collections. Lungs/Pleura: Atelectasis in the lung bases. No focal consolidation. No pleural effusions. No pneumothorax. Airways are patent. Musculoskeletal: Postoperative changes with lower cervical anterior fixation and right shoulder arthroplasty. Thoracolumbar scoliosis convex towards the right in the thoracic region and towards the left in the lumbar region. Diffuse degenerative changes throughout the thoracic spine. No vertebral compression deformities. Sternum and ribs appear intact. No acute displaced fractures identified. CT ABDOMEN PELVIS FINDINGS Hepatobiliary: No hepatic injury or perihepatic hematoma. Gallbladder is unremarkable Pancreas: Unremarkable. No pancreatic ductal dilatation or surrounding inflammatory changes. Spleen: No splenic injury or perisplenic hematoma. Adrenals/Urinary Tract: No adrenal hemorrhage or renal injury  identified. Bladder is unremarkable. Stomach/Bowel: Stomach, small bowel, and colon are not abnormally distended. Stool fills the colon. No wall thickening or inflammatory changes appreciated. No mesenteric hematoma or gas collections. Appendix is not identified. Vascular/Lymphatic: Aortic atherosclerosis. No enlarged abdominal or pelvic lymph nodes. Reproductive: Uterus and ovaries are not enlarged. Calcifications in the uterus likely representing calcified fibroids. Other: No free air or free fluid in the abdomen. Abdominal wall musculature appears intact. Musculoskeletal: Thoracolumbar scoliosis. No vertebral compression deformities. Degenerative changes throughout the lumbar spine with narrowed interspaces and endplate hypertrophic changes. Sacrum, pelvis, and hips appear intact. No acute fractures are identified. IMPRESSION: 1. No acute posttraumatic changes demonstrated in the chest, abdomen, or pelvis. 2. Atelectasis in the lung bases. 3. No evidence of mediastinal injury or pulmonary parenchymal injury. 4. No evidence of solid organ injury or bowel perforation. 5. Thoracolumbar scoliosis and degenerative changes. 6. Calcified uterine fibroids. 7. Aortic atherosclerosis. Electronically Signed   By: Burman Nieves M.D.   On: 02/06/2019 20:53    Pending Labs Unresulted Labs (From admission, onward)    Start     Ordered   02/06/19 1927  Urinalysis, Routine w reflex microscopic  ONCE - STAT,   STAT     02/06/19 1926          Vitals/Pain Today's Vitals   02/06/19 2200 02/06/19 2230 02/06/19 2300 02/06/19 2315  BP: (!) 165/46 (!) 170/51 (!) 183/52   Pulse: 74 73 75 75  Resp: 14 18 17 13   Temp:      TempSrc:      SpO2: 93% 94% 93% 95%  Weight:      Height:      PainSc:        Isolation Precautions No active isolations  Medications Medications  fentaNYL (SUBLIMAZE) injection 25 mcg (has no administration in time range)  sodium chloride 0.9 % bolus 500 mL (0 mLs Intravenous Stopped  02/06/19 2317)  ondansetron (ZOFRAN) injection 4 mg (4 mg Intravenous Given 02/06/19 1948)  fentaNYL (SUBLIMAZE) injection 25 mcg (25 mcg Intravenous Given 02/06/19 1949)  iohexol (OMNIPAQUE) 300 MG/ML solution 100 mL (100 mLs Intravenous Contrast Given 02/06/19 2035)  ondansetron (ZOFRAN) injection 4 mg (4 mg Intravenous Given 02/06/19 2136)    Mobility walks Low fall risk   Focused Assessments Neuro Assessment Handoff:           Neuro Assessment: Exceptions to WDL Neuro Checks:      Last Documented NIHSS  Modified Score:   Has TPA been given? No If patient is a Neuro Trauma and patient is going to OR before floor call report to 4N Charge nurse: [phone] or [phone]     R Recommendations: See Admitting Provider Note  Report given to:   Additional Notes: Pt currently in MRI

## 2019-02-07 NOTE — Consult Note (Addendum)
Cardiology Consultation:   Patient ID: Danielle Clarke MRN: [id number]; DOB: August 06, 1941  Admit date: 02/06/2019 Date of Consult: 02/07/2019  Primary Care Provider: Juluis Rainier, MD Primary Cardiologist: Lesleigh Noe, MD   Patient Profile:   Danielle Clarke is a 78 y.o. 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   She has a history of recurrent syncope felt to be related to swallow syncope (Syncope was always associated with dysphagia , followed by pain, followed by fainting). Echocardiogram 01/05/2017: EF 60-65%, G1DD, mild aortic stenosis. Carotid Dopplers: 01/05/2017 40-59% right, 1-39% left no significant change.  She was doing well on cardiac stand point when last seen by Dr. Katrinka Blazing 01/2018.  History of Present Illness:   Danielle Clarke had syncope episode yesterday. She was checking out of at dentist office and had syncope falling backwards.  CT head showing left frontal intraparenchymal hematoma with small volume mixed SAH/ SDH over bilateral convexities left greater than right. No plan for intervention. PCCM consulted for elevated blood pressure. She is on nifedipine drip.   Overnight patient had multiple episode of vomiting associated with vagal bradycardia, longest 12 sec at 3:44am. While on nifedipine drip. Overnight she continued to vagal intermittently. Around 5:50am she had 6 sec pause again while coughing. Given atropine x 1, now HR improved to 100s.   Past Medical History:  Diagnosis Date   Arthritis    Asthmatic bronchitis    with colds per patient   Cataract    Cutaneous abscess of right foot    Glaucoma    Heart murmur    Dr Katrinka Blazing is her  cardiologist.    Hyperlipemia    Hypertension    Dr. Katrinka Blazing ~ 2 years ago   Neck fracture [facility])    july 2013   Osteoporosis      Type 1 diabetes mellitus [facility])     Past Surgical History:  Procedure Laterality Date   ANKLE FRACTURE SURGERY Left 2001   steel plate and 3 screws    ANTERIOR CERVICAL CORPECTOMY N/A 07/31/2014   Procedure: Cervical Four to Cervical Six Corpectomy;  Surgeon: Karn Cassis, MD;  Location: [facility];  Service: Neurosurgery;  Laterality: N/A;  C4 to C6 Corpectomy   BREAST SURGERY  1988   CATARACT EXTRACTION W/ INTRAOCULAR LENS  IMPLANT, BILATERAL Bilateral 2011   right and then left   EYE SURGERY     FRACTURE SURGERY     HAMMER TOE SURGERY  1998   I&D EXTREMITY Right 09/27/2018   Procedure: IRRIGATION AND DEBRIDEMENT RIGHT FOOT;  Surgeon: Nadara Mustard, MD;  Location: [facility] OR;  Service: Orthopedics;  Laterality: Right;   JOINT REPLACEMENT     TONSILLECTOMY AND ADENOIDECTOMY  1948   TOTAL SHOULDER REPLACEMENT  2010   right shoulder    TUBAL LIGATION  1979   TYMPANOSTOMY TUBE PLACEMENT Bilateral      Inpatient Medications: Scheduled Meds:   stroke: mapping our early stages of recovery book   Does not apply Once   sodium chloride   Intravenous Once   insulin aspart  0-9 Units Subcutaneous Q4H   labetalol       Continuous Infusions:  niCARDipine 3 mg/hr (02/07/19 0207)   PRN Meds: [DISCONTINUED] ondansetron **OR** ondansetron (ZOFRAN) IV  Allergies:  Allergies  Allergen Reactions   Cefaclor Anaphylaxis   Tetracycline Anaphylaxis   Vancomycin Anaphylaxis   Augmentin [Amoxicillin-Pot Clavulanate] Other (See Comments)    Reaction unknown >> Anaphylaxis Has patient had a PCN reaction causing immediate rash, facial/tongue/throat swelling, SOB or lightheadedness with hypotension: Unknown Has patient had a PCN reaction causing severe rash involving mucus membranes or skin necrosis: Unknown Has patient had a PCN reaction that required hospitalization: Unknown Has patient had a PCN reaction occurring within the last 10 years: Unknown If all of the above  answers are "NO", then may proceed with Cephalosporin use.    Prednisone Other (See Comments)    Reaction unknown    Social History:   Social History   Socioeconomic History   Marital status: Divorced    Spouse name: Not on file   Number of children: 2   Years of education: [employer]   Highest education level: Not on file  Occupational History   Occupation: Retired    Associate Professor: RETIRED  Ecologist strain: Not on file   Food insecurity:    Worry: Not on file    Inability: Not on file   Transportation needs:    Medical: Not on file    Non-medical: Not on file  Tobacco Use   Smoking status: Never Smoker   Smokeless tobacco: Never Used  Substance and Sexual Activity   Alcohol use: Yes    Alcohol/week: 7.0 standard drinks    Types: 7 Glasses of wine per week   Drug use: Not on file   Sexual activity: Never  Lifestyle   Physical activity:    Days per week: Not on file    Minutes per session: Not on file   Stress: Not on file  Relationships   Social connections:    Talks on phone: Not on file    Gets together: Not on file    Attends religious service: Not on file    Active member of club or organization: Not on file    Attends meetings of clubs or organizations: Not on file    Relationship status: Not on file   Intimate partner violence:    Fear of current or ex partner: Not on file    Emotionally abused: Not on file    Physically abused: Not on file    Forced sexual activity: Not on file  Other Topics Concern   Not on file  Social History Narrative   Patient lives at home alone.   Caffeine Use:     Family History:   Family History  Problem Relation Age of Onset   Cancer Mother    Heart Problems Father      ROS:  Please see the history of present illness.  All other ROS reviewed and negative.     Physical Exam/Data:   Vitals:   02/07/19 0345 02/07/19 0353 02/07/19 0400 02/07/19 0545  BP: (!) 156/46  (!)  142/45 125/69  Pulse: 82  80 83  Resp: (!) 23  20 20   Temp:   98.1 F (36.7 C)   TempSrc:   Oral   SpO2: 98%  100% 100%  Weight:  49.6 kg    Height:        Intake/Output Summary (Last 24 hours) at 02/07/2019 0707 Last data filed at 02/06/2019 2317 Gross per 24 hour  Intake 500 ml  Output --  Net 500 ml   Last 3 Weights 02/07/2019 02/06/2019 01/08/2019  Weight (lbs) 109  lb 5.6 oz 109 lb 113 lb  Weight (kg) 49.6 kg 49.442 kg 51.256 kg     Body mass index is 21.36 kg/m.  General:  Ill appearing elderly frail female in delirious HEENT: normal Lymph: no adenopathy Neck: no JVD Endocrine:  No thryomegaly Vascular: No carotid bruits; FA pulses 2+ bilaterally without bruits  Cardiac:  normal S1, S2; 3/6 systolic murmur  Lungs:  clear to auscultation bilaterally, no wheezing, rhonchi or rales  Abd: soft, nontender, no hepatomegaly  Ext: no edema Musculoskeletal:  No deformities Skin: warm and dry  Neuro: no focal abnormalities noted Psych:  Delirious  EKG:  The EKG was personally reviewed and demonstrates:  SR at 79 bpm Telemetry:  Telemetry was personally reviewed and demonstrates:  Sinus rhythm, intermittent pause,longest 12sec  Relevant CV Studies: As above  Laboratory Data:  Chemistry Recent Labs  Lab 02/06/19 1957 02/06/19 2004 02/07/19 0439  NA 135  --  137  K 4.1  --  4.3  CL 98  --  93*  CO2 29  --  26  GLUCOSE 173*  --  290*  BUN 20  --  19  CREATININE 0.84 0.80 0.90  CALCIUM 8.8*  --  9.5  GFRNONAA >60  --  >60  GFRAA >60  --  >60  ANIONGAP 8  --  18*    Recent Labs  Lab 02/06/19 1957 02/07/19 0439  PROT 5.9* 6.7  ALBUMIN 3.6 3.7  AST 39 32  ALT 24 25  ALKPHOS 70 68  BILITOT 0.5 0.6   Hematology Recent Labs  Lab 02/06/19 1957 02/07/19 0439  WBC 11.4* 12.9*  RBC 3.90 3.91  HGB 11.4* 11.5*  HCT 35.9* 35.2*  MCV 92.1 90.0  MCH 29.2 29.4  MCHC 31.8 32.7  RDW 13.4 13.4  PLT 273 268   Cardiac Enzymes Recent Labs  Lab 02/07/19 0439   TROPONINI <0.03   Radiology/Studies:  Ct Head Wo Contrast  Result Date: 02/07/2019 CLINICAL DATA:  Intracranial hemorrhage with new vomiting and change in pupil EXAM: CT HEAD WITHOUT CONTRAST TECHNIQUE: Contiguous axial images were obtained from the base of the skull through the vertex without intravenous contrast. COMPARISON:  Brain MRI from yesterday FINDINGS: Brain: Mixed mainly high-density hematoma in the left frontal lobe unchanged from MRI at up to 4 cm diameter. Increase in regional edema with local mass effect. Scattered subarachnoid hemorrhage. Subdural hematoma along the left cerebral convexity measuring up to 6 mm in thickness, not increased. Hemorrhagic contusion in the right temporal pole with mild local subarachnoid hemorrhage. No appreciable mass effect in this area. Midline shift measures up to 4 mm at the anterior septum pellucidum. Vascular: Negative Skull: Right parietal scalp hematoma. Sinuses/Orbits: Bilateral cataract resection. IMPRESSION: No increase in multifocal intracranial hemorrhage, but there is increased swelling around the 4 cm left frontal hematoma with midline shift measuring up to 4 mm. No herniation or entrapment. Electronically Signed   By: Marnee Spring M.D.   On: 02/07/2019 05:22   Ct Head Wo Contrast  Result Date: 02/06/2019 CLINICAL DATA:  Fall EXAM: CT HEAD WITHOUT CONTRAST CT CERVICAL SPINE WITHOUT CONTRAST TECHNIQUE: Multidetector CT imaging of the head and cervical spine was performed following the standard protocol without intravenous contrast. Multiplanar CT image reconstructions of the cervical spine were also generated. COMPARISON:  Head CT 01/04/2017 FINDINGS: CT HEAD FINDINGS Brain: There is a left frontal intraparenchymal hematoma measuring 2.2 x 2.2 x 3.9 cm (volume = 9.9 cm^3) with moderate surrounding edema.  There is mixed subdural and subarachnoid blood over the left convexity, extending into the sylvian fissure. Smaller amount of subarachnoid  blood the right sylvian fissure. No midline shift or other significant mass effect. No hydrocephalus. Vascular: No abnormal hyperdensity of the major intracranial arteries or dural venous sinuses. No intracranial atherosclerosis. Skull: Large right parietal posterior scalp hematoma. Sinuses/Orbits: No fluid levels or advanced mucosal thickening of the visualized paranasal sinuses. No mastoid or middle ear effusion. The orbits are normal. CT CERVICAL SPINE FINDINGS Alignment: No static subluxation. Facets are aligned. Occipital condyles are normally positioned. Skull base and vertebrae: There is an old type 2 fracture of the dens with chronic nonunion. There is there is C3-C6 anterior fusion with solid anterior fusion mass. No acute fracture. Soft tissues and spinal canal: No prevertebral fluid or swelling. No visible canal hematoma. Disc levels: No bony spinal canal stenosis. Upper chest: No pneumothorax, pulmonary nodule or pleural effusion. Other: Normal visualized paraspinal cervical soft tissues. IMPRESSION: 1. Left frontal intraparenchymal hematoma with volume approximately 10 mL and mild surrounding edema with small volume subarachnoid/subdural blood over both convexities, left-greater-than-right. 2. Posterior right parietal scalp hematoma, consistent with a contrecoup pattern of injury. 3. No acute fracture or static subluxation of the cervical spine. 4. 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 Critical Value/emergent results were called by telephone at the time of interpretation on 02/06/2019 at 8:41 pm to Dr. Mancel Bale , who verbally acknowledged these results. Electronically Signed   By: Deatra Robinson M.D.   On: 02/06/2019 20:47   Ct Chest W Contrast  Result Date: 02/06/2019 CLINICAL DATA:  Blunt abdominal trauma. EXAM: CT CHEST, ABDOMEN, AND PELVIS WITH CONTRAST TECHNIQUE: Multidetector CT imaging of the chest, abdomen and pelvis was performed following the standard protocol  during bolus administration of intravenous contrast. CONTRAST:  OMNIPAQUE IOHEXOL 300 MG/ML  SOLN COMPARISON:  CT chest 08/06/2017. FINDINGS: CT CHEST FINDINGS Cardiovascular: Normal heart size. No pericardial effusions. Coronary artery and mitral valve annulus calcifications. Normal caliber thoracic aorta with calcifications. No aortic dissection. Central pulmonary arteries are well opacified without evidence of significant pulmonary embolus. Mediastinum/Nodes: Esophagus is decompressed. No significant lymphadenopathy in the chest. No abnormal mediastinal gas or fluid collections. Lungs/Pleura: Atelectasis in the lung bases. No focal consolidation. No pleural effusions. No pneumothorax. Airways are patent. Musculoskeletal: Postoperative changes with lower cervical anterior fixation and right shoulder arthroplasty. Thoracolumbar scoliosis convex towards the right in the thoracic region and towards the left in the lumbar region. Diffuse degenerative changes throughout the thoracic spine. No vertebral compression deformities. Sternum and ribs appear intact. No acute displaced fractures identified. CT ABDOMEN PELVIS FINDINGS Hepatobiliary: No hepatic injury or perihepatic hematoma. Gallbladder is unremarkable Pancreas: Unremarkable. No pancreatic ductal dilatation or surrounding inflammatory changes. Spleen: No splenic injury or perisplenic hematoma. Adrenals/Urinary Tract: No adrenal hemorrhage or renal injury identified. Bladder is unremarkable. Stomach/Bowel: Stomach, small bowel, and colon are not abnormally distended. Stool fills the colon. No wall thickening or inflammatory changes appreciated. No mesenteric hematoma or gas collections. Appendix is not identified. Vascular/Lymphatic: Aortic atherosclerosis. No enlarged abdominal or pelvic lymph nodes. Reproductive: Uterus and ovaries are not enlarged. Calcifications in the uterus likely representing calcified fibroids. Other: No free air or free fluid in  the abdomen. Abdominal wall musculature appears intact. Musculoskeletal: Thoracolumbar scoliosis. No vertebral compression deformities. Degenerative changes throughout the lumbar spine with narrowed interspaces and endplate hypertrophic changes. Sacrum, pelvis, and hips appear intact. No acute fractures are identified. IMPRESSION: 1. No  acute posttraumatic changes demonstrated in the chest, abdomen, or pelvis. 2. Atelectasis in the lung bases. 3. No evidence of mediastinal injury or pulmonary parenchymal injury. 4. No evidence of solid organ injury or bowel perforation. 5. Thoracolumbar scoliosis and degenerative changes. 6. Calcified uterine fibroids. 7. Aortic atherosclerosis. Electronically Signed   By: Burman NievesWilliam  Stevens M.D.   On: 02/06/2019 20:53   Ct Cervical Spine Wo Contrast  Result Date: 02/06/2019 CLINICAL DATA:  Fall EXAM: CT HEAD WITHOUT CONTRAST CT CERVICAL SPINE WITHOUT CONTRAST TECHNIQUE: Multidetector CT imaging of the head and cervical spine was performed following the standard protocol without intravenous contrast. Multiplanar CT image reconstructions of the cervical spine were also generated. COMPARISON:  Head CT 01/04/2017 FINDINGS: CT HEAD FINDINGS Brain: There is a left frontal intraparenchymal hematoma measuring 2.2 x 2.2 x 3.9 cm (volume = 9.9 cm^3) with moderate surrounding edema. There is mixed subdural and subarachnoid blood over the left convexity, extending into the sylvian fissure. Smaller amount of subarachnoid blood the right sylvian fissure. No midline shift or other significant mass effect. No hydrocephalus. Vascular: No abnormal hyperdensity of the major intracranial arteries or dural venous sinuses. No intracranial atherosclerosis. Skull: Large right parietal posterior scalp hematoma. Sinuses/Orbits: No fluid levels or advanced mucosal thickening of the visualized paranasal sinuses. No mastoid or middle ear effusion. The orbits are normal. CT CERVICAL SPINE FINDINGS Alignment:  No static subluxation. Facets are aligned. Occipital condyles are normally positioned. Skull base and vertebrae: There is an old type 2 fracture of the dens with chronic nonunion. There is there is C3-C6 anterior fusion with solid anterior fusion mass. No acute fracture. Soft tissues and spinal canal: No prevertebral fluid or swelling. No visible canal hematoma. Disc levels: No bony spinal canal stenosis. Upper chest: No pneumothorax, pulmonary nodule or pleural effusion. Other: Normal visualized paraspinal cervical soft tissues. IMPRESSION: 1. Left frontal intraparenchymal hematoma with volume approximately 10 mL and mild surrounding edema with small volume subarachnoid/subdural blood over both convexities, left-greater-than-right. 2. Posterior right parietal scalp hematoma, consistent with a contrecoup pattern of injury. 3. No acute fracture or static subluxation of the cervical spine. 4. 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 Critical Value/emergent results were called by telephone at the time of interpretation on 02/06/2019 at 8:41 pm to Dr. Mancel BaleELLIOTT WENTZ , who verbally acknowledged these results. Electronically Signed   By: Deatra RobinsonKevin  Herman M.D.   On: 02/06/2019 20:47   Mr Maxine GlennMra Head Wo Contrast  Result Date: 02/07/2019 CLINICAL DATA:  Initial evaluation for acute syncope, fall, traumatic intracranial hemorrhage. EXAM: MRI HEAD WITHOUT AND WITH CONTRAST MRA HEAD WITHOUT CONTRAST TECHNIQUE: Multiplanar, multiecho pulse sequences of the brain and surrounding structures were obtained without and with intravenous contrast. Angiographic images of the head were obtained using MRA technique without contrast. CONTRAST:  5 cc of Gadavist. COMPARISON:  Prior CT from earlier the same day. FINDINGS: MRI HEAD FINDINGS Brain: Examination mildly degraded by motion artifact. Intraparenchymal hematoma centered at the anterior left frontal lobe again seen, measuring 3.9 x 4.1 x 5.1 cm (estimated volume 40 cc,  previously 10 cc). Internal fluid fluid level. Localized edema and regional mass effect mildly increased, with partial effacement of the frontal horn of the adjacent left lateral ventricle. Mild 4 mm left-to-right midline shift. Additional small hemorrhagic contusions at the temporal poles bilaterally, measuring 17 mm on the right (series 26, image 21) and 14 mm on the left (series 26, image 19). Localized edema without significant regional mass effect. Moderate  volume subarachnoid hemorrhage involving the left greater than right cerebral hemispheres with extension into the sylvian fissures. Small amount of subarachnoid seen within the interpeduncular cistern. Small volume intraventricular hemorrhage now seen, likely related to redistribution. No hydrocephalus or ventricular trapping. Left holo hemispheric subdural hematoma measures up to 6 mm in maximal thickness at the level of the left parietal convexity. Smaller right convexity subdural hemorrhage measures up to 3 mm in maximal thickness. Mild extension along the falx noted. Underlying age-related cerebral atrophy. Patchy T2/FLAIR hyperintensity within the bilateral thalami, likely related to chronic microvascular ischemic disease, mild for age. No evidence for acute or subacute infarct. Gray-white matter differentiation otherwise maintained. No areas of chronic infarction. No mass lesion. Minimal patchy enhancement underlying the left frontal hematoma felt to be reactive in nature. No underlying discrete mass identified. No other abnormal enhancement within the brain. Pituitary gland suprasellar region normal. Midline structures intact. Vascular: Major intracranial vascular flow voids maintained. Skull and upper cervical spine: Craniocervical junction within normal limits. Postsurgical changes noted within the upper cervical spine. Bone marrow signal intensity within normal limits. Left parietal scalp contusion with superimposed 13 mm soft tissue hematoma  noted. Patient status post bilateral ocular lens replacement. Paranasal sinuses are largely clear. Right concha bullosa with associated right-to-left nasal septal deviation noted. No mastoid effusion. Inner ear structures grossly normal. Sinuses/Orbits: None. Other: None. MRA HEAD FINDINGS ANTERIOR CIRCULATION: Examination mildly degraded by motion artifact. Distal cervical segments of the internal carotid arteries are widely patent with symmetric antegrade flow. Petrous, cavernous, and supraclinoid segments patent with scattered atheromatous irregularity. Probable short-segment moderate stenoses at the anterior cavernous right ICA and supraclinoid left ICA. Apparent 3 mm outpouching arising at the region of the right ophthalmic artery, which could reflect a small aneurysm or vascular infundibulum (series 7, image 93). Finding not entirely certain on this motion degraded exam. ICA termini well perfused. A1 segments widely patent bilaterally. Normal anterior communicating artery. Anterior cerebral arteries patent to their distal aspects without stenosis. M1 segments patent bilaterally. Normal MCA bifurcations. Distal MCA branches well perfused and symmetric. No vascular abnormality seen underlying the left frontal hematoma. POSTERIOR CIRCULATION: Vertebral arteries patent to the vertebrobasilar junction without flow-limiting stenosis. Left PICA patent. Right PICA not seen. Probable short fenestration at the proximal basilar artery noted. Basilar patent to its distal aspect without stenosis. Superior cerebral arteries patent bilaterally. PCA supplied via the basilar as well as small bilateral posterior communicating arteries. PCAs patent to their distal aspects without appreciable stenosis. IMPRESSION: MRI HEAD IMPRESSION: 1. Interval expansion of left frontal intraparenchymal hematoma, now measuring up to 40 cc in volume (previously 10 cc). Localized edema and regional mass effect mildly increased. No underlying mass  lesion or other abnormality. 2. Additional small hemorrhagic contusions at the anterior temporal poles bilaterally as above. 3. Moderate volume traumatic subarachnoid hemorrhage involving the left greater than right cerebral hemispheres with small left greater than right subdural hematomas. Associated mild 4 mm left-to-right midline shift. No hydrocephalus or ventricular trapping. 4. Evolving right parietal scalp contusion. 5. Otherwise negative brain MRI. No other findings to explain intracranial hemorrhage identified. MRA HEAD IMPRESSION: 1. Technically limited exam due to motion artifact. 2. No vascular abnormality underlying the left frontal intraparenchymal hematoma identified. 3. 3 mm focal outpouching arising from the cavernous right ICA, which could reflect a small aneurysm versus vascular infundibulum, difficult to be certain given motion artifact on this exam. Either way, finding felt to most likely be incidental in nature, and unrelated to  the acute intracranial hemorrhage. 4. Atherosclerotic change within the carotid siphons with associated moderate multifocal stenoses as above. Electronically Signed   By: Rise Mu M.D.   On: 02/07/2019 01:09   Mr Laqueta Jean EA Contrast  Result Date: 02/07/2019 CLINICAL DATA:  Initial evaluation for acute syncope, fall, traumatic intracranial hemorrhage. EXAM: MRI HEAD WITHOUT AND WITH CONTRAST MRA HEAD WITHOUT CONTRAST TECHNIQUE: Multiplanar, multiecho pulse sequences of the brain and surrounding structures were obtained without and with intravenous contrast. Angiographic images of the head were obtained using MRA technique without contrast. CONTRAST:  5 cc of Gadavist. COMPARISON:  Prior CT from earlier the same day. FINDINGS: MRI HEAD FINDINGS Brain: Examination mildly degraded by motion artifact. Intraparenchymal hematoma centered at the anterior left frontal lobe again seen, measuring 3.9 x 4.1 x 5.1 cm (estimated volume 40 cc, previously 10 cc).  Internal fluid fluid level. Localized edema and regional mass effect mildly increased, with partial effacement of the frontal horn of the adjacent left lateral ventricle. Mild 4 mm left-to-right midline shift. Additional small hemorrhagic contusions at the temporal poles bilaterally, measuring 17 mm on the right (series 26, image 21) and 14 mm on the left (series 26, image 19). Localized edema without significant regional mass effect. Moderate volume subarachnoid hemorrhage involving the left greater than right cerebral hemispheres with extension into the sylvian fissures. Small amount of subarachnoid seen within the interpeduncular cistern. Small volume intraventricular hemorrhage now seen, likely related to redistribution. No hydrocephalus or ventricular trapping. Left holo hemispheric subdural hematoma measures up to 6 mm in maximal thickness at the level of the left parietal convexity. Smaller right convexity subdural hemorrhage measures up to 3 mm in maximal thickness. Mild extension along the falx noted. Underlying age-related cerebral atrophy. Patchy T2/FLAIR hyperintensity within the bilateral thalami, likely related to chronic microvascular ischemic disease, mild for age. No evidence for acute or subacute infarct. Gray-white matter differentiation otherwise maintained. No areas of chronic infarction. No mass lesion. Minimal patchy enhancement underlying the left frontal hematoma felt to be reactive in nature. No underlying discrete mass identified. No other abnormal enhancement within the brain. Pituitary gland suprasellar region normal. Midline structures intact. Vascular: Major intracranial vascular flow voids maintained. Skull and upper cervical spine: Craniocervical junction within normal limits. Postsurgical changes noted within the upper cervical spine. Bone marrow signal intensity within normal limits. Left parietal scalp contusion with superimposed 13 mm soft tissue hematoma noted. Patient status  post bilateral ocular lens replacement. Paranasal sinuses are largely clear. Right concha bullosa with associated right-to-left nasal septal deviation noted. No mastoid effusion. Inner ear structures grossly normal. Sinuses/Orbits: None. Other: None. MRA HEAD FINDINGS ANTERIOR CIRCULATION: Examination mildly degraded by motion artifact. Distal cervical segments of the internal carotid arteries are widely patent with symmetric antegrade flow. Petrous, cavernous, and supraclinoid segments patent with scattered atheromatous irregularity. Probable short-segment moderate stenoses at the anterior cavernous right ICA and supraclinoid left ICA. Apparent 3 mm outpouching arising at the region of the right ophthalmic artery, which could reflect a small aneurysm or vascular infundibulum (series 7, image 93). Finding not entirely certain on this motion degraded exam. ICA termini well perfused. A1 segments widely patent bilaterally. Normal anterior communicating artery. Anterior cerebral arteries patent to their distal aspects without stenosis. M1 segments patent bilaterally. Normal MCA bifurcations. Distal MCA branches well perfused and symmetric. No vascular abnormality seen underlying the left frontal hematoma. POSTERIOR CIRCULATION: Vertebral arteries patent to the vertebrobasilar junction without flow-limiting stenosis. Left PICA patent. Right PICA not  seen. Probable short fenestration at the proximal basilar artery noted. Basilar patent to its distal aspect without stenosis. Superior cerebral arteries patent bilaterally. PCA supplied via the basilar as well as small bilateral posterior communicating arteries. PCAs patent to their distal aspects without appreciable stenosis. IMPRESSION: MRI HEAD IMPRESSION: 1. Interval expansion of left frontal intraparenchymal hematoma, now measuring up to 40 cc in volume (previously 10 cc). Localized edema and regional mass effect mildly increased. No underlying mass lesion or other  abnormality. 2. Additional small hemorrhagic contusions at the anterior temporal poles bilaterally as above. 3. Moderate volume traumatic subarachnoid hemorrhage involving the left greater than right cerebral hemispheres with small left greater than right subdural hematomas. Associated mild 4 mm left-to-right midline shift. No hydrocephalus or ventricular trapping. 4. Evolving right parietal scalp contusion. 5. Otherwise negative brain MRI. No other findings to explain intracranial hemorrhage identified. MRA HEAD IMPRESSION: 1. Technically limited exam due to motion artifact. 2. No vascular abnormality underlying the left frontal intraparenchymal hematoma identified. 3. 3 mm focal outpouching arising from the cavernous right ICA, which could reflect a small aneurysm versus vascular infundibulum, difficult to be certain given motion artifact on this exam. Either way, finding felt to most likely be incidental in nature, and unrelated to the acute intracranial hemorrhage. 4. Atherosclerotic change within the carotid siphons with associated moderate multifocal stenoses as above. Electronically Signed   By: Rise Mu M.D.   On: 02/07/2019 01:09   Ct Abdomen Pelvis W Contrast  Result Date: 02/06/2019 CLINICAL DATA:  Blunt abdominal trauma. EXAM: CT CHEST, ABDOMEN, AND PELVIS WITH CONTRAST TECHNIQUE: Multidetector CT imaging of the chest, abdomen and pelvis was performed following the standard protocol during bolus administration of intravenous contrast. CONTRAST:  OMNIPAQUE IOHEXOL 300 MG/ML  SOLN COMPARISON:  CT chest 08/06/2017. FINDINGS: CT CHEST FINDINGS Cardiovascular: Normal heart size. No pericardial effusions. Coronary artery and mitral valve annulus calcifications. Normal caliber thoracic aorta with calcifications. No aortic dissection. Central pulmonary arteries are well opacified without evidence of significant pulmonary embolus. Mediastinum/Nodes: Esophagus is decompressed. No significant  lymphadenopathy in the chest. No abnormal mediastinal gas or fluid collections. Lungs/Pleura: Atelectasis in the lung bases. No focal consolidation. No pleural effusions. No pneumothorax. Airways are patent. Musculoskeletal: Postoperative changes with lower cervical anterior fixation and right shoulder arthroplasty. Thoracolumbar scoliosis convex towards the right in the thoracic region and towards the left in the lumbar region. Diffuse degenerative changes throughout the thoracic spine. No vertebral compression deformities. Sternum and ribs appear intact. No acute displaced fractures identified. CT ABDOMEN PELVIS FINDINGS Hepatobiliary: No hepatic injury or perihepatic hematoma. Gallbladder is unremarkable Pancreas: Unremarkable. No pancreatic ductal dilatation or surrounding inflammatory changes. Spleen: No splenic injury or perisplenic hematoma. Adrenals/Urinary Tract: No adrenal hemorrhage or renal injury identified. Bladder is unremarkable. Stomach/Bowel: Stomach, small bowel, and colon are not abnormally distended. Stool fills the colon. No wall thickening or inflammatory changes appreciated. No mesenteric hematoma or gas collections. Appendix is not identified. Vascular/Lymphatic: Aortic atherosclerosis. No enlarged abdominal or pelvic lymph nodes. Reproductive: Uterus and ovaries are not enlarged. Calcifications in the uterus likely representing calcified fibroids. Other: No free air or free fluid in the abdomen. Abdominal wall musculature appears intact. Musculoskeletal: Thoracolumbar scoliosis. No vertebral compression deformities. Degenerative changes throughout the lumbar spine with narrowed interspaces and endplate hypertrophic changes. Sacrum, pelvis, and hips appear intact. No acute fractures are identified. IMPRESSION: 1. No acute posttraumatic changes demonstrated in the chest, abdomen, or pelvis. 2. Atelectasis in the lung bases. 3. No  evidence of mediastinal injury or pulmonary parenchymal injury.  4. No evidence of solid organ injury or bowel perforation. 5. Thoracolumbar scoliosis and degenerative changes. 6. Calcified uterine fibroids. 7. Aortic atherosclerosis. Electronically Signed   By: Burman Nieves M.D.   On: 02/06/2019 20:53   Dg Chest Port 1 View  Result Date: 02/07/2019 CLINICAL DATA:  Vomiting. Concern for aspiration. EXAM: PORTABLE CHEST 1 VIEW COMPARISON:  08/06/2017 FINDINGS: Normal heart size and pulmonary vascularity. Calcification in the mitral valve annulus. Lungs are clear. No blunting of costophrenic angles. No pneumothorax. Mediastinal contours appear intact. Calcification of the aorta. Postoperative changes in the cervical spine and right shoulder. Lumbar scoliosis convex towards the left. IMPRESSION: No evidence of active pulmonary disease. Aortic atherosclerosis. Electronically Signed   By: Burman Nieves M.D.   On: 02/07/2019 03:35    Assessment and Plan:   1. High degree bradycardia - Likely vagal mediated due to vomiting and cough. Patient is severely delirious. Conservative approach bed side pacer and PRN atropine. Continue to monitor on tele. On Nifedipine but would not cause bradycardia. Avoid AV blocking agent.   2. Syncope - Previously her syncope felt due to swallowing issue. Yesterday syncope occurred while standing. Differential includes orthostatics, vagal, bradycardia, AS or carotid disease. Pending echo. May required monitor vs LINQ vs pacer pending clinical response.   3. Aortic stenosis - Pending repeat echo  4. Carotid artery disease -  Carotid Dopplers: 01/05/2017 40-59% right, 1-39% left no significant change. - Consider repeat study  5. SDH - per primary. ASA held.   6. Hypertensive urgency - On strict control per PCCM.    For questions or updates, please contact [facility] Please consult [email or web address] for contact info under     Lorelei Pont, [place]  02/07/2019 7:07 AM   Patient seen and examined and history  reviewed. Agree with above findings and plan.  78 yo WF with history of AS and swallowing mediated syncope. Patient had a syncopal episode while leaving dentist office yesterday and fell hitting head. Noted some confusion and aphasia. Found to have left frontal SAH/IPH. Admitted. Neurosurgery recommends conservative therapy for now. She was severely hypertensive and started on Nicardipene drip. She has had episodes of profound bradycardia and pauses up to 11 seconds associated with N/V and cough. Now HR 90-100 post atropine. No clear symptoms with episodes. Patient unable to provide history.  On exam she is a small, thin, elderly female in restraints.  Opens eyes to stimuli. Not able to converse. Lungs are clear.  CV RRR with harsh gr 3/6 systolic murmur of aortic stenosis.  No edema. Pulses 2+  Ecg shows NSR rate 79. Old anteroseptal infarct.  Hgb 11.5. chemistries are normal except glucose 290 Troponin is negative.   Impression:  1. Intermittent bradycardia/pauses/AV block due to marked vagal stimuli with N/V and cough. Not clearly symptomatic. Prior history of swallowing induced syncope. She is on no HR slowing medication. For now recommend continued supportive therapy. PRN atropine as needed. Transcutaneous pacemaker at bedside. No indication for pacemaker now.  2. Aortic stenosis. Mild in 2018 but murmur more prominent. Will check Echo 3. Fall with SAH/SDH/IPH left frontal 4. HTN controlled on nifedipene.  5. Syncope. Unclear if this is her swallowing mediated syncope or something new. Will check Echo. Monitor on telemetry. May need event monitor when DC home. Check carotid dopplers.   Tenaya Hilyer [place], MDFACC 02/07/2019 7:41 AM

## 2019-02-07 NOTE — Consult Note (Signed)
Reason for Consult: Coffee-ground emesis Referring Physician: ICU team  Danielle Clarke is an 78 y.o. female.  HPI: Patient seen and examined and case discussed with the ICU team as well as the patient's daughter her hospital computer chart in our office computer chart was reviewed and she has seen my partner Dr. Evette Cristal for 2 screening colonoscopies last one a few years ago has not had an upper tract work-up though at home she periodically does have some vomiting which according to the daughter sounds like it is probably from getting food caught but she has not discussed this with the physician since her recent trauma think some vomit but last one had some coffee-ground material we are consulted for further work-up and plans and she has not had a bowel movement since she has been here and has no lower bowel complaints at home according to the daughter  Past Medical History:  Diagnosis Date  . Aortic stenosis, mild   . Arthritis   . Asthmatic bronchitis    with colds per patient  . Carotid artery disease (HCC)    40-59% bilateral ICA stenosis  . Cataract   . Cutaneous abscess of right foot   . Glaucoma   . Heart murmur    Dr Katrinka Blazing is her  cardiologist.   . Hyperlipemia   . Hypertension    Dr. Katrinka Blazing ~ 2 years ago  . Neck fracture [facility])    july 2013  . Osteoporosis   . Syncope   . Type 1 diabetes mellitus (HCC)     Past Surgical History:  Procedure Laterality Date  . ANKLE FRACTURE SURGERY Left 2001   steel plate and 3 screws   . ANTERIOR CERVICAL CORPECTOMY N/A 07/31/2014   Procedure: Cervical Four to Cervical Six Corpectomy;  Surgeon: Karn Cassis, MD;  Location: [facility];  Service: Neurosurgery;  Laterality: N/A;  C4 to C6 Corpectomy  . BREAST SURGERY  1988  . CATARACT EXTRACTION W/ INTRAOCULAR LENS  IMPLANT, BILATERAL Bilateral 2011   right and then left  . EYE SURGERY    . FRACTURE SURGERY    . HAMMER TOE SURGERY  1998  . I&D EXTREMITY Right 09/27/2018   Procedure:  IRRIGATION AND DEBRIDEMENT RIGHT FOOT;  Surgeon: Nadara Mustard, MD;  Location: [facility] OR;  Service: Orthopedics;  Laterality: Right;  . JOINT REPLACEMENT    . TONSILLECTOMY AND ADENOIDECTOMY  1948  . TOTAL SHOULDER REPLACEMENT  2010   right shoulder   . TUBAL LIGATION  1979  . TYMPANOSTOMY TUBE PLACEMENT Bilateral     Family History  Problem Relation Age of Onset  . Cancer Mother   . Heart Problems Father     Social History:  reports that she has never smoked. She has never used smokeless tobacco. She reports current alcohol use of about 7.0 standard drinks of alcohol per week. No history on file for drug.  Allergies:  Allergies  Allergen Reactions  . Cefaclor Anaphylaxis  . Tetracycline Anaphylaxis  . Vancomycin Anaphylaxis  . Augmentin [Amoxicillin-Pot Clavulanate] Other (See Comments)    Reaction unknown >> Anaphylaxis Has patient had a PCN reaction causing immediate rash, facial/tongue/throat swelling, SOB or lightheadedness with hypotension: Unknown Has patient had a PCN reaction causing severe rash involving mucus membranes or skin necrosis: Unknown Has patient had a PCN reaction that required hospitalization: Unknown Has patient had a PCN reaction occurring within the last 10 years: Unknown If all of the above answers are "NO", then may  proceed with Cephalosporin use.   . Prednisone Other (See Comments)    Reaction unknown    Medications: I have reviewed the patient's current medications.  Results for orders placed or performed during the hospital encounter of 02/06/19 (from the past 48 hour(s))  CBG monitoring, ED     Status: Abnormal   Collection Time: 02/06/19  7:24 PM  Result Value Ref Range   Glucose-Capillary 151 (H) 70 - 99 mg/dL  Comprehensive metabolic panel     Status: Abnormal   Collection Time: 02/06/19  7:57 PM  Result Value Ref Range   Sodium 135 135 - 145 mmol/L   Potassium 4.1 3.5 - 5.1 mmol/L   Chloride 98 98 - 111 mmol/L   CO2 29 22 - 32 mmol/L    Glucose, Bld 173 (H) 70 - 99 mg/dL   BUN 20 8 - 23 mg/dL   Creatinine, Ser 9.60 0.44 - 1.00 mg/dL   Calcium 8.8 (L) 8.9 - 10.3 mg/dL   Total Protein 5.9 (L) 6.5 - 8.1 g/dL   Albumin 3.6 3.5 - 5.0 g/dL   AST 39 15 - 41 U/L   ALT 24 0 - 44 U/L   Alkaline Phosphatase 70 38 - 126 U/L   Total Bilirubin 0.5 0.3 - 1.2 mg/dL   GFR calc non Af Amer >60 >60 mL/min   GFR calc Af Amer >60 >60 mL/min   Anion gap 8 5 - 15    Comment: Performed at [facility], [address] [place], [place] [address]  CBC with Differential     Status: Abnormal   Collection Time: 02/06/19  7:57 PM  Result Value Ref Range   WBC 11.4 (H) 4.0 - 10.5 K/uL   RBC 3.90 3.87 - 5.11 MIL/uL   Hemoglobin 11.4 (L) 12.0 - 15.0 g/dL   HCT 81.1 (L) 91.4 - 78.2 %   MCV 92.1 80.0 - 100.0 fL   MCH 29.2 26.0 - 34.0 pg   MCHC 31.8 30.0 - 36.0 g/dL   RDW 95.6 21.3 - 08.6 %   Platelets 273 150 - 400 K/uL   nRBC 0.0 0.0 - 0.2 %   Neutrophils Relative % 80 %   Neutro Abs 9.4 (H) 1.7 - 7.7 K/uL   Lymphocytes Relative 10 %   Lymphs Abs 1.1 0.7 - 4.0 K/uL   Monocytes Relative 7 %   Monocytes Absolute 0.8 0.1 - 1.0 K/uL   Eosinophils Relative 1 %   Eosinophils Absolute 0.1 0.0 - 0.5 K/uL   Basophils Relative 1 %   Basophils Absolute 0.1 0.0 - 0.1 K/uL   Immature Granulocytes 1 %   Abs Immature Granulocytes 0.06 0.00 - 0.07 K/uL    Comment: Performed at [facility], [address], [place], [place] [address]  ABO/Rh     Status: None   Collection Time: 02/06/19  7:57 PM  Result Value Ref Range   ABO/RH(D)      A POS Performed at [facility], [address] [place], [place] [address]   I-stat Creatinine, ED     Status: None   Collection Time: 02/06/19  8:04 PM  Result Value Ref Range   Creatinine, Ser 0.80 0.44 - 1.00 mg/dL  CBG monitoring, ED     Status: Abnormal   Collection Time: 02/06/19 11:14 PM  Result Value Ref Range   Glucose-Capillary 192 (H) 70 - 99 mg/dL  Protime-INR     Status: None  Collection Time: 02/07/19  2:38 AM  Result Value Ref Range   Prothrombin Time 12.5 11.4 - 15.2 seconds   INR 0.9 0.8 - 1.2    Comment: (NOTE) INR goal varies based on device and disease states. Performed at [facility], [address] [place], [place] [address]   MRSA PCR Screening     Status: None   Collection Time: 02/07/19  3:07 AM  Result Value Ref Range   MRSA by PCR NEGATIVE NEGATIVE    Comment:        The GeneXpert MRSA Assay (FDA approved for NASAL specimens only), is one component of a comprehensive MRSA colonization surveillance program. It is not intended to diagnose MRSA infection nor to guide or monitor treatment for MRSA infections. Performed at [facility], [address] [place], [place] [address]   Glucose, capillary     Status: Abnormal   Collection Time: 02/07/19  3:20 AM  Result Value Ref Range   Glucose-Capillary 231 (H) 70 - 99 mg/dL  CBC     Status: Abnormal   Collection Time: 02/07/19  4:39 AM  Result Value Ref Range   WBC 12.9 (H) 4.0 - 10.5 K/uL   RBC 3.91 3.87 - 5.11 MIL/uL   Hemoglobin 11.5 (L) 12.0 - 15.0 g/dL   HCT 09.8 (L) 11.9 - 14.7 %   MCV 90.0 80.0 - 100.0 fL   MCH 29.4 26.0 - 34.0 pg   MCHC 32.7 30.0 - 36.0 g/dL   RDW 82.9 56.2 - 13.0 %   Platelets 268 150 - 400 K/uL   nRBC 0.0 0.0 - 0.2 %    Comment: Performed at [facility], [address] [place], [place] [address]  Hepatic function panel     Status: None   Collection Time: 02/07/19  4:39 AM  Result Value Ref Range   Total Protein 6.7 6.5 - 8.1 g/dL   Albumin 3.7 3.5 - 5.0 g/dL   AST 32 15 - 41 U/L   ALT 25 0 - 44 U/L   Alkaline Phosphatase 68 38 - 126 U/L   Total Bilirubin 0.6 0.3 - 1.2 mg/dL   Bilirubin, Direct 0.1 0.0 - 0.2 mg/dL   Indirect Bilirubin 0.5 0.3 - 0.9 mg/dL    Comment: Performed at [facility], [address] [place], [place] [address]  Troponin I - Now Then Q6H     Status: None   Collection Time: 02/07/19  4:39 AM  Result  Value Ref Range   Troponin I <0.03 <0.03 ng/mL    Comment: Performed at [facility], [address] [place], [place] [address]  Magnesium     Status: None   Collection Time: 02/07/19  4:39 AM  Result Value Ref Range   Magnesium 1.9 1.7 - 2.4 mg/dL    Comment: Performed at [facility], [address] [place], [place] [address]  Basic metabolic panel     Status: Abnormal   Collection Time: 02/07/19  4:39 AM  Result Value Ref Range   Sodium 137 135 - 145 mmol/L   Potassium 4.3 3.5 - 5.1 mmol/L   Chloride 93 (L) 98 - 111 mmol/L   CO2 26 22 - 32 mmol/L   Glucose, Bld 290 (H) 70 - 99 mg/dL   BUN 19 8 - 23 mg/dL   Creatinine, Ser 0.10 0.44 - 1.00 mg/dL   Calcium 9.5 8.9 - 27.2 mg/dL   GFR calc  non Af Amer >60 >60 mL/min   GFR calc Af Amer >60 >60 mL/min   Anion gap 18 (H) 5 - 15    Comment: Performed at [facility], [address] [place], [place] [address]  Glucose, capillary     Status: Abnormal   Collection Time: 02/07/19  8:27 AM  Result Value Ref Range   Glucose-Capillary 299 (H) 70 - 99 mg/dL  Troponin I - Now Then Q6H     Status: None   Collection Time: 02/07/19  8:49 AM  Result Value Ref Range   Troponin I <0.03 <0.03 ng/mL    Comment: Performed at [facility], [address] [place], [place] [address]  Prepare Pheresed Platelets     Status: None (Preliminary result)   Collection Time: 02/07/19  8:49 AM  Result Value Ref Range   Unit Number [id number]    Blood Component Type PLT POOL LR    Unit division 00    Status of Unit ISSUED    Transfusion Status      OK TO TRANSFUSE Performed at [facility], [address], [place], [place] [address]   Glucose, capillary     Status: Abnormal   Collection Time: 02/07/19 11:40 AM  Result Value Ref Range   Glucose-Capillary 261 (H) 70 - 99 mg/dL    Ct Head Wo Contrast  Result Date: 02/07/2019 CLINICAL DATA:  Intracranial hemorrhage with new vomiting and change in pupil EXAM: CT HEAD  WITHOUT CONTRAST TECHNIQUE: Contiguous axial images were obtained from the base of the skull through the vertex without intravenous contrast. COMPARISON:  Brain MRI from yesterday FINDINGS: Brain: Mixed mainly high-density hematoma in the left frontal lobe unchanged from MRI at up to 4 cm diameter. Increase in regional edema with local mass effect. Scattered subarachnoid hemorrhage. Subdural hematoma along the left cerebral convexity measuring up to 6 mm in thickness, not increased. Hemorrhagic contusion in the right temporal pole with mild local subarachnoid hemorrhage. No appreciable mass effect in this area. Midline shift measures up to 4 mm at the anterior septum pellucidum. Vascular: Negative Skull: Right parietal scalp hematoma. Sinuses/Orbits: Bilateral cataract resection. IMPRESSION: No increase in multifocal intracranial hemorrhage, but there is increased swelling around the 4 cm left frontal hematoma with midline shift measuring up to 4 mm. No herniation or entrapment. Electronically Signed   By: Marnee Spring M.D.   On: 02/07/2019 05:22   Ct Head Wo Contrast  Result Date: 02/06/2019 CLINICAL DATA:  Fall EXAM: CT HEAD WITHOUT CONTRAST CT CERVICAL SPINE WITHOUT CONTRAST TECHNIQUE: Multidetector CT imaging of the head and cervical spine was performed following the standard protocol without intravenous contrast. Multiplanar CT image reconstructions of the cervical spine were also generated. COMPARISON:  Head CT 01/04/2017 FINDINGS: CT HEAD FINDINGS Brain: There is a left frontal intraparenchymal hematoma measuring 2.2 x 2.2 x 3.9 cm (volume = 9.9 cm^3) with moderate surrounding edema. There is mixed subdural and subarachnoid blood over the left convexity, extending into the sylvian fissure. Smaller amount of subarachnoid blood the right sylvian fissure. No midline shift or other significant mass effect. No hydrocephalus. Vascular: No abnormal hyperdensity of the major intracranial arteries or dural  venous sinuses. No intracranial atherosclerosis. Skull: Large right parietal posterior scalp hematoma. Sinuses/Orbits: No fluid levels or advanced mucosal thickening of the visualized paranasal sinuses. No mastoid or middle ear effusion. The orbits are normal. CT CERVICAL SPINE FINDINGS Alignment: No static subluxation. Facets are aligned. Occipital condyles are  normally positioned. Skull base and vertebrae: There is an old type 2 fracture of the dens with chronic nonunion. There is there is C3-C6 anterior fusion with solid anterior fusion mass. No acute fracture. Soft tissues and spinal canal: No prevertebral fluid or swelling. No visible canal hematoma. Disc levels: No bony spinal canal stenosis. Upper chest: No pneumothorax, pulmonary nodule or pleural effusion. Other: Normal visualized paraspinal cervical soft tissues. IMPRESSION: 1. Left frontal intraparenchymal hematoma with volume approximately 10 mL and mild surrounding edema with small volume subarachnoid/subdural blood over both convexities, left-greater-than-right. 2. Posterior right parietal scalp hematoma, consistent with a contrecoup pattern of injury. 3. No acute fracture or static subluxation of the cervical spine. 4. 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 Critical Value/emergent results were called by telephone at the time of interpretation on 02/06/2019 at 8:41 pm to Dr. Mancel Bale , who verbally acknowledged these results. Electronically Signed   By: Deatra Robinson M.D.   On: 02/06/2019 20:47   Ct Chest W Contrast  Result Date: 02/06/2019 CLINICAL DATA:  Blunt abdominal trauma. EXAM: CT CHEST, ABDOMEN, AND PELVIS WITH CONTRAST TECHNIQUE: Multidetector CT imaging of the chest, abdomen and pelvis was performed following the standard protocol during bolus administration of intravenous contrast. CONTRAST:  OMNIPAQUE IOHEXOL 300 MG/ML  SOLN COMPARISON:  CT chest 08/06/2017. FINDINGS: CT CHEST FINDINGS Cardiovascular:  Normal heart size. No pericardial effusions. Coronary artery and mitral valve annulus calcifications. Normal caliber thoracic aorta with calcifications. No aortic dissection. Central pulmonary arteries are well opacified without evidence of significant pulmonary embolus. Mediastinum/Nodes: Esophagus is decompressed. No significant lymphadenopathy in the chest. No abnormal mediastinal gas or fluid collections. Lungs/Pleura: Atelectasis in the lung bases. No focal consolidation. No pleural effusions. No pneumothorax. Airways are patent. Musculoskeletal: Postoperative changes with lower cervical anterior fixation and right shoulder arthroplasty. Thoracolumbar scoliosis convex towards the right in the thoracic region and towards the left in the lumbar region. Diffuse degenerative changes throughout the thoracic spine. No vertebral compression deformities. Sternum and ribs appear intact. No acute displaced fractures identified. CT ABDOMEN PELVIS FINDINGS Hepatobiliary: No hepatic injury or perihepatic hematoma. Gallbladder is unremarkable Pancreas: Unremarkable. No pancreatic ductal dilatation or surrounding inflammatory changes. Spleen: No splenic injury or perisplenic hematoma. Adrenals/Urinary Tract: No adrenal hemorrhage or renal injury identified. Bladder is unremarkable. Stomach/Bowel: Stomach, small bowel, and colon are not abnormally distended. Stool fills the colon. No wall thickening or inflammatory changes appreciated. No mesenteric hematoma or gas collections. Appendix is not identified. Vascular/Lymphatic: Aortic atherosclerosis. No enlarged abdominal or pelvic lymph nodes. Reproductive: Uterus and ovaries are not enlarged. Calcifications in the uterus likely representing calcified fibroids. Other: No free air or free fluid in the abdomen. Abdominal wall musculature appears intact. Musculoskeletal: Thoracolumbar scoliosis. No vertebral compression deformities. Degenerative changes throughout the lumbar spine  with narrowed interspaces and endplate hypertrophic changes. Sacrum, pelvis, and hips appear intact. No acute fractures are identified. IMPRESSION: 1. No acute posttraumatic changes demonstrated in the chest, abdomen, or pelvis. 2. Atelectasis in the lung bases. 3. No evidence of mediastinal injury or pulmonary parenchymal injury. 4. No evidence of solid organ injury or bowel perforation. 5. Thoracolumbar scoliosis and degenerative changes. 6. Calcified uterine fibroids. 7. Aortic atherosclerosis. Electronically Signed   By: Burman Nieves M.D.   On: 02/06/2019 20:53   Ct Cervical Spine Wo Contrast  Result Date: 02/06/2019 CLINICAL DATA:  Fall EXAM: CT HEAD WITHOUT CONTRAST CT CERVICAL SPINE WITHOUT CONTRAST TECHNIQUE: Multidetector CT imaging of the head and  cervical spine was performed following the standard protocol without intravenous contrast. Multiplanar CT image reconstructions of the cervical spine were also generated. COMPARISON:  Head CT 01/04/2017 FINDINGS: CT HEAD FINDINGS Brain: There is a left frontal intraparenchymal hematoma measuring 2.2 x 2.2 x 3.9 cm (volume = 9.9 cm^3) with moderate surrounding edema. There is mixed subdural and subarachnoid blood over the left convexity, extending into the sylvian fissure. Smaller amount of subarachnoid blood the right sylvian fissure. No midline shift or other significant mass effect. No hydrocephalus. Vascular: No abnormal hyperdensity of the major intracranial arteries or dural venous sinuses. No intracranial atherosclerosis. Skull: Large right parietal posterior scalp hematoma. Sinuses/Orbits: No fluid levels or advanced mucosal thickening of the visualized paranasal sinuses. No mastoid or middle ear effusion. The orbits are normal. CT CERVICAL SPINE FINDINGS Alignment: No static subluxation. Facets are aligned. Occipital condyles are normally positioned. Skull base and vertebrae: There is an old type 2 fracture of the dens with chronic nonunion. There  is there is C3-C6 anterior fusion with solid anterior fusion mass. No acute fracture. Soft tissues and spinal canal: No prevertebral fluid or swelling. No visible canal hematoma. Disc levels: No bony spinal canal stenosis. Upper chest: No pneumothorax, pulmonary nodule or pleural effusion. Other: Normal visualized paraspinal cervical soft tissues. IMPRESSION: 1. Left frontal intraparenchymal hematoma with volume approximately 10 mL and mild surrounding edema with small volume subarachnoid/subdural blood over both convexities, left-greater-than-right. 2. Posterior right parietal scalp hematoma, consistent with a contrecoup pattern of injury. 3. No acute fracture or static subluxation of the cervical spine. 4. 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 Critical Value/emergent results were called by telephone at the time of interpretation on 02/06/2019 at 8:41 pm to Dr. Mancel Bale , who verbally acknowledged these results. Electronically Signed   By: Deatra Robinson M.D.   On: 02/06/2019 20:47   Mr Maxine Glenn Head Wo Contrast  Result Date: 02/07/2019 CLINICAL DATA:  Initial evaluation for acute syncope, fall, traumatic intracranial hemorrhage. EXAM: MRI HEAD WITHOUT AND WITH CONTRAST MRA HEAD WITHOUT CONTRAST TECHNIQUE: Multiplanar, multiecho pulse sequences of the brain and surrounding structures were obtained without and with intravenous contrast. Angiographic images of the head were obtained using MRA technique without contrast. CONTRAST:  5 cc of Gadavist. COMPARISON:  Prior CT from earlier the same day. FINDINGS: MRI HEAD FINDINGS Brain: Examination mildly degraded by motion artifact. Intraparenchymal hematoma centered at the anterior left frontal lobe again seen, measuring 3.9 x 4.1 x 5.1 cm (estimated volume 40 cc, previously 10 cc). Internal fluid fluid level. Localized edema and regional mass effect mildly increased, with partial effacement of the frontal horn of the adjacent left lateral  ventricle. Mild 4 mm left-to-right midline shift. Additional small hemorrhagic contusions at the temporal poles bilaterally, measuring 17 mm on the right (series 26, image 21) and 14 mm on the left (series 26, image 19). Localized edema without significant regional mass effect. Moderate volume subarachnoid hemorrhage involving the left greater than right cerebral hemispheres with extension into the sylvian fissures. Small amount of subarachnoid seen within the interpeduncular cistern. Small volume intraventricular hemorrhage now seen, likely related to redistribution. No hydrocephalus or ventricular trapping. Left holo hemispheric subdural hematoma measures up to 6 mm in maximal thickness at the level of the left parietal convexity. Smaller right convexity subdural hemorrhage measures up to 3 mm in maximal thickness. Mild extension along the falx noted. Underlying age-related cerebral atrophy. Patchy T2/FLAIR hyperintensity within the bilateral thalami, likely related to chronic  microvascular ischemic disease, mild for age. No evidence for acute or subacute infarct. Gray-white matter differentiation otherwise maintained. No areas of chronic infarction. No mass lesion. Minimal patchy enhancement underlying the left frontal hematoma felt to be reactive in nature. No underlying discrete mass identified. No other abnormal enhancement within the brain. Pituitary gland suprasellar region normal. Midline structures intact. Vascular: Major intracranial vascular flow voids maintained. Skull and upper cervical spine: Craniocervical junction within normal limits. Postsurgical changes noted within the upper cervical spine. Bone marrow signal intensity within normal limits. Left parietal scalp contusion with superimposed 13 mm soft tissue hematoma noted. Patient status post bilateral ocular lens replacement. Paranasal sinuses are largely clear. Right concha bullosa with associated right-to-left nasal septal deviation noted. No  mastoid effusion. Inner ear structures grossly normal. Sinuses/Orbits: None. Other: None. MRA HEAD FINDINGS ANTERIOR CIRCULATION: Examination mildly degraded by motion artifact. Distal cervical segments of the internal carotid arteries are widely patent with symmetric antegrade flow. Petrous, cavernous, and supraclinoid segments patent with scattered atheromatous irregularity. Probable short-segment moderate stenoses at the anterior cavernous right ICA and supraclinoid left ICA. Apparent 3 mm outpouching arising at the region of the right ophthalmic artery, which could reflect a small aneurysm or vascular infundibulum (series 7, image 93). Finding not entirely certain on this motion degraded exam. ICA termini well perfused. A1 segments widely patent bilaterally. Normal anterior communicating artery. Anterior cerebral arteries patent to their distal aspects without stenosis. M1 segments patent bilaterally. Normal MCA bifurcations. Distal MCA branches well perfused and symmetric. No vascular abnormality seen underlying the left frontal hematoma. POSTERIOR CIRCULATION: Vertebral arteries patent to the vertebrobasilar junction without flow-limiting stenosis. Left PICA patent. Right PICA not seen. Probable short fenestration at the proximal basilar artery noted. Basilar patent to its distal aspect without stenosis. Superior cerebral arteries patent bilaterally. PCA supplied via the basilar as well as small bilateral posterior communicating arteries. PCAs patent to their distal aspects without appreciable stenosis. IMPRESSION: MRI HEAD IMPRESSION: 1. Interval expansion of left frontal intraparenchymal hematoma, now measuring up to 40 cc in volume (previously 10 cc). Localized edema and regional mass effect mildly increased. No underlying mass lesion or other abnormality. 2. Additional small hemorrhagic contusions at the anterior temporal poles bilaterally as above. 3. Moderate volume traumatic subarachnoid hemorrhage  involving the left greater than right cerebral hemispheres with small left greater than right subdural hematomas. Associated mild 4 mm left-to-right midline shift. No hydrocephalus or ventricular trapping. 4. Evolving right parietal scalp contusion. 5. Otherwise negative brain MRI. No other findings to explain intracranial hemorrhage identified. MRA HEAD IMPRESSION: 1. Technically limited exam due to motion artifact. 2. No vascular abnormality underlying the left frontal intraparenchymal hematoma identified. 3. 3 mm focal outpouching arising from the cavernous right ICA, which could reflect a small aneurysm versus vascular infundibulum, difficult to be certain given motion artifact on this exam. Either way, finding felt to most likely be incidental in nature, and unrelated to the acute intracranial hemorrhage. 4. Atherosclerotic change within the carotid siphons with associated moderate multifocal stenoses as above. Electronically Signed   By: Rise Mu M.D.   On: 02/07/2019 01:09   Mr Laqueta Jean ZO Contrast  Result Date: 02/07/2019 CLINICAL DATA:  Initial evaluation for acute syncope, fall, traumatic intracranial hemorrhage. EXAM: MRI HEAD WITHOUT AND WITH CONTRAST MRA HEAD WITHOUT CONTRAST TECHNIQUE: Multiplanar, multiecho pulse sequences of the brain and surrounding structures were obtained without and with intravenous contrast. Angiographic images of the head were obtained using MRA technique without contrast.  CONTRAST:  5 cc of Gadavist. COMPARISON:  Prior CT from earlier the same day. FINDINGS: MRI HEAD FINDINGS Brain: Examination mildly degraded by motion artifact. Intraparenchymal hematoma centered at the anterior left frontal lobe again seen, measuring 3.9 x 4.1 x 5.1 cm (estimated volume 40 cc, previously 10 cc). Internal fluid fluid level. Localized edema and regional mass effect mildly increased, with partial effacement of the frontal horn of the adjacent left lateral ventricle. Mild 4 mm  left-to-right midline shift. Additional small hemorrhagic contusions at the temporal poles bilaterally, measuring 17 mm on the right (series 26, image 21) and 14 mm on the left (series 26, image 19). Localized edema without significant regional mass effect. Moderate volume subarachnoid hemorrhage involving the left greater than right cerebral hemispheres with extension into the sylvian fissures. Small amount of subarachnoid seen within the interpeduncular cistern. Small volume intraventricular hemorrhage now seen, likely related to redistribution. No hydrocephalus or ventricular trapping. Left holo hemispheric subdural hematoma measures up to 6 mm in maximal thickness at the level of the left parietal convexity. Smaller right convexity subdural hemorrhage measures up to 3 mm in maximal thickness. Mild extension along the falx noted. Underlying age-related cerebral atrophy. Patchy T2/FLAIR hyperintensity within the bilateral thalami, likely related to chronic microvascular ischemic disease, mild for age. No evidence for acute or subacute infarct. Gray-white matter differentiation otherwise maintained. No areas of chronic infarction. No mass lesion. Minimal patchy enhancement underlying the left frontal hematoma felt to be reactive in nature. No underlying discrete mass identified. No other abnormal enhancement within the brain. Pituitary gland suprasellar region normal. Midline structures intact. Vascular: Major intracranial vascular flow voids maintained. Skull and upper cervical spine: Craniocervical junction within normal limits. Postsurgical changes noted within the upper cervical spine. Bone marrow signal intensity within normal limits. Left parietal scalp contusion with superimposed 13 mm soft tissue hematoma noted. Patient status post bilateral ocular lens replacement. Paranasal sinuses are largely clear. Right concha bullosa with associated right-to-left nasal septal deviation noted. No mastoid effusion.  Inner ear structures grossly normal. Sinuses/Orbits: None. Other: None. MRA HEAD FINDINGS ANTERIOR CIRCULATION: Examination mildly degraded by motion artifact. Distal cervical segments of the internal carotid arteries are widely patent with symmetric antegrade flow. Petrous, cavernous, and supraclinoid segments patent with scattered atheromatous irregularity. Probable short-segment moderate stenoses at the anterior cavernous right ICA and supraclinoid left ICA. Apparent 3 mm outpouching arising at the region of the right ophthalmic artery, which could reflect a small aneurysm or vascular infundibulum (series 7, image 93). Finding not entirely certain on this motion degraded exam. ICA termini well perfused. A1 segments widely patent bilaterally. Normal anterior communicating artery. Anterior cerebral arteries patent to their distal aspects without stenosis. M1 segments patent bilaterally. Normal MCA bifurcations. Distal MCA branches well perfused and symmetric. No vascular abnormality seen underlying the left frontal hematoma. POSTERIOR CIRCULATION: Vertebral arteries patent to the vertebrobasilar junction without flow-limiting stenosis. Left PICA patent. Right PICA not seen. Probable short fenestration at the proximal basilar artery noted. Basilar patent to its distal aspect without stenosis. Superior cerebral arteries patent bilaterally. PCA supplied via the basilar as well as small bilateral posterior communicating arteries. PCAs patent to their distal aspects without appreciable stenosis. IMPRESSION: MRI HEAD IMPRESSION: 1. Interval expansion of left frontal intraparenchymal hematoma, now measuring up to 40 cc in volume (previously 10 cc). Localized edema and regional mass effect mildly increased. No underlying mass lesion or other abnormality. 2. Additional small hemorrhagic contusions at the anterior temporal poles bilaterally as above.  3. Moderate volume traumatic subarachnoid hemorrhage involving the left  greater than right cerebral hemispheres with small left greater than right subdural hematomas. Associated mild 4 mm left-to-right midline shift. No hydrocephalus or ventricular trapping. 4. Evolving right parietal scalp contusion. 5. Otherwise negative brain MRI. No other findings to explain intracranial hemorrhage identified. MRA HEAD IMPRESSION: 1. Technically limited exam due to motion artifact. 2. No vascular abnormality underlying the left frontal intraparenchymal hematoma identified. 3. 3 mm focal outpouching arising from the cavernous right ICA, which could reflect a small aneurysm versus vascular infundibulum, difficult to be certain given motion artifact on this exam. Either way, finding felt to most likely be incidental in nature, and unrelated to the acute intracranial hemorrhage. 4. Atherosclerotic change within the carotid siphons with associated moderate multifocal stenoses as above. Electronically Signed   By: Rise Mu M.D.   On: 02/07/2019 01:09   Ct Abdomen Pelvis W Contrast  Result Date: 02/06/2019 CLINICAL DATA:  Blunt abdominal trauma. EXAM: CT CHEST, ABDOMEN, AND PELVIS WITH CONTRAST TECHNIQUE: Multidetector CT imaging of the chest, abdomen and pelvis was performed following the standard protocol during bolus administration of intravenous contrast. CONTRAST:  OMNIPAQUE IOHEXOL 300 MG/ML  SOLN COMPARISON:  CT chest 08/06/2017. FINDINGS: CT CHEST FINDINGS Cardiovascular: Normal heart size. No pericardial effusions. Coronary artery and mitral valve annulus calcifications. Normal caliber thoracic aorta with calcifications. No aortic dissection. Central pulmonary arteries are well opacified without evidence of significant pulmonary embolus. Mediastinum/Nodes: Esophagus is decompressed. No significant lymphadenopathy in the chest. No abnormal mediastinal gas or fluid collections. Lungs/Pleura: Atelectasis in the lung bases. No focal consolidation. No pleural effusions. No  pneumothorax. Airways are patent. Musculoskeletal: Postoperative changes with lower cervical anterior fixation and right shoulder arthroplasty. Thoracolumbar scoliosis convex towards the right in the thoracic region and towards the left in the lumbar region. Diffuse degenerative changes throughout the thoracic spine. No vertebral compression deformities. Sternum and ribs appear intact. No acute displaced fractures identified. CT ABDOMEN PELVIS FINDINGS Hepatobiliary: No hepatic injury or perihepatic hematoma. Gallbladder is unremarkable Pancreas: Unremarkable. No pancreatic ductal dilatation or surrounding inflammatory changes. Spleen: No splenic injury or perisplenic hematoma. Adrenals/Urinary Tract: No adrenal hemorrhage or renal injury identified. Bladder is unremarkable. Stomach/Bowel: Stomach, small bowel, and colon are not abnormally distended. Stool fills the colon. No wall thickening or inflammatory changes appreciated. No mesenteric hematoma or gas collections. Appendix is not identified. Vascular/Lymphatic: Aortic atherosclerosis. No enlarged abdominal or pelvic lymph nodes. Reproductive: Uterus and ovaries are not enlarged. Calcifications in the uterus likely representing calcified fibroids. Other: No free air or free fluid in the abdomen. Abdominal wall musculature appears intact. Musculoskeletal: Thoracolumbar scoliosis. No vertebral compression deformities. Degenerative changes throughout the lumbar spine with narrowed interspaces and endplate hypertrophic changes. Sacrum, pelvis, and hips appear intact. No acute fractures are identified. IMPRESSION: 1. No acute posttraumatic changes demonstrated in the chest, abdomen, or pelvis. 2. Atelectasis in the lung bases. 3. No evidence of mediastinal injury or pulmonary parenchymal injury. 4. No evidence of solid organ injury or bowel perforation. 5. Thoracolumbar scoliosis and degenerative changes. 6. Calcified uterine fibroids. 7. Aortic atherosclerosis.  Electronically Signed   By: Burman Nieves M.D.   On: 02/06/2019 20:53   Dg Chest Port 1 View  Result Date: 02/07/2019 CLINICAL DATA:  Vomiting. Concern for aspiration. EXAM: PORTABLE CHEST 1 VIEW COMPARISON:  08/06/2017 FINDINGS: Normal heart size and pulmonary vascularity. Calcification in the mitral valve annulus. Lungs are clear. No blunting of costophrenic angles. No pneumothorax. Mediastinal  contours appear intact. Calcification of the aorta. Postoperative changes in the cervical spine and right shoulder. Lumbar scoliosis convex towards the left. IMPRESSION: No evidence of active pulmonary disease. Aortic atherosclerosis. Electronically Signed   By: Burman Nieves M.D.   On: 02/07/2019 03:35    ROS she opens her eyes occasionally but not answering questions Blood pressure (!) 143/47, pulse 88, temperature 98.9 F (37.2 C), temperature source Axillary, resp. rate (!) 21, height 5' (1.524 m), weight 49.6 kg, SpO2 96 %. Physical Exam patient lying comfortably in the bed vital signs stable afebrile open her eyes occasionally abdomen is soft nontender bowel sounds hemoglobin and BUN okay recent abdominal pelvic CT okay  Assessment/Plan: Coffee-ground emesis in a patient with intracerebral bleed Plan: Agree with IV pump inhibitors for now and continue observation and please call us if signs of active bleeding but would like to hold endoscopy for now but as an outpatient would benefit from an upper tract evaluation at some point as needed urgently later this hospital stay will ask my partner Dr. Dulce Sellar to check on tomorrow  [facility], [place] 02/07/2019, 12:02 PM

## 2019-02-07 NOTE — Progress Notes (Addendum)
PCCM Interval Note  Patient with ongoing vagal episodes with ongoing vomiting/ dry heaves.  S/p zofran x 2.  RN has given atropine, in which HR did respond.  On review of rhythm strips, appears patient develops mobitz type II and at times intermittent third degree heart block.    AM labs with normal electrolytes and negative troponin thus far.   P:  Cardiology consulted and currently here to see patient Attempted to contact patient's family, was able to reach patient's daughter in law who will contact her husband and patient's daughter.   Posey Boyer, MSN, AGACNP-BC [facility] Pgr: [phone] or if no answer [phone] 02/07/2019, 6:51 AM

## 2019-02-07 NOTE — Progress Notes (Signed)
Patient had several episodes of coffee ground emesis between 3am-6am. Patient heart rate would vagal down as low as asystole. MD notified and strips saved. Atropine kept at patient bedside. Antiemetics given. RN will continue to monitor.

## 2019-02-07 NOTE — ED Notes (Signed)
Pt just returned fro MRI

## 2019-02-07 NOTE — Evaluation (Signed)
Speech Language Pathology Evaluation Patient Details Name: Danielle Clarke MRN: [id number] DOB: Jul 01, 1941 Today's Date: 02/07/2019 Time: 4034-7425 SLP Time Calculation (min) (ACUTE ONLY): 19 min  Problem List:  Patient Active Problem List   Diagnosis Date Noted  . Hypertensive urgency 02/07/2019  . Intracerebral hemorrhage (HCC) 02/07/2019  . Intracranial bleeding (HCC) 02/06/2019  . Elevated troponin   . Abnormal EKG   . Bronchitis 08/05/2017  . Bilateral carotid artery stenosis   . Aortic stenosis 08/25/2015  . Cervical stenosis of spinal canal 07/31/2014  . CKD (chronic kidney disease) stage 2, GFR 60-89 ml/min 12/30/2013  . Essential hypertension, benign 12/30/2013  . Hyperlipidemia 12/30/2013  . Toe infection 12/30/2013  . Anemia 12/30/2013  . Acute renal failure (HCC) 12/29/2013  . Syncope 01/02/2013  . Diabetes mellitus type 1 (HCC) 01/02/2013  . Bilateral carotid artery disease (HCC) 01/02/2013  . GOITER, MULTINODULAR 02/11/2008   Past Medical History:  Past Medical History:  Diagnosis Date  . Aortic stenosis, mild   . Arthritis   . Asthmatic bronchitis    with colds per patient  . Carotid artery disease (HCC)    40-59% bilateral ICA stenosis  . Cataract   . Cutaneous abscess of right foot   . Glaucoma   . Heart murmur    Dr Katrinka Blazing is her  cardiologist.   . Hyperlipemia   . Hypertension    Dr. Katrinka Blazing ~ 2 years ago  . Neck fracture [facility])    july 2013  . Osteoporosis   . Syncope   . Type 1 diabetes mellitus (HCC)    Past Surgical History:  Past Surgical History:  Procedure Laterality Date  . ANKLE FRACTURE SURGERY Left 2001   steel plate and 3 screws   . ANTERIOR CERVICAL CORPECTOMY N/A 07/31/2014   Procedure: Cervical Four to Cervical Six Corpectomy;  Surgeon: Karn Cassis, MD;  Location: [facility];  Service: Neurosurgery;  Laterality: N/A;  C4 to C6 Corpectomy  . BREAST SURGERY  1988  . CATARACT EXTRACTION W/ INTRAOCULAR LENS  IMPLANT,  BILATERAL Bilateral 2011   right and then left  . EYE SURGERY    . FRACTURE SURGERY    . HAMMER TOE SURGERY  1998  . I&D EXTREMITY Right 09/27/2018   Procedure: IRRIGATION AND DEBRIDEMENT RIGHT FOOT;  Surgeon: Nadara Mustard, MD;  Location: [facility];  Service: Orthopedics;  Laterality: Right;  . JOINT REPLACEMENT    . TONSILLECTOMY AND ADENOIDECTOMY  1948  . TOTAL SHOULDER REPLACEMENT  2010   right shoulder   . TUBAL LIGATION  1979  . TYMPANOSTOMY TUBE PLACEMENT Bilateral    HPI:  Danielle Clarke is a 78 y.o. female with history of diabetes mellitus type 1, asthmatic bronchitis, neck fx 2013, hyperlipidemia was brought to the ER after syncopal episode at the dentist office. Had some difficulty talking. CT head showed left frontal intraparenchymal hematoma and subarachnoid and subdural hematoma in both convexities (small hemorrhagic contusions at the anterior temporal poles bilaterally).   Assessment / Plan / Recommendation Clinical Impression  Pt kept eyes closed and was unable to sustain attention to SLP for > 3 seconds before closing eyes; daughter at bedside. Restless and distracted by desire to get out of bed; swinging legs over side of bed and pulling at Posey belt. Despite attempts requesting pt to verbalize what she wanted/needed she was only able to respond 2 times in short phrases. Utterances unintelligible marked by low intensity and reduced articulatory movement/effort (possibly one  word intelligible). Hand over hand therapist initiated and assisted face washing that she maintained for 5 seconds independently. Pt had MRI in middle of the night coupled with lethargy from stroke preventing ability to fully differentiate aphasia versus dysarthria or mix. ST will intervene to improve cognition, speech intelligibility and diagnostic treatment of speech/language.         SLP Assessment  SLP Recommendation/Assessment: Patient needs continued Speech Lanaguage Pathology Services SLP Visit  Diagnosis: Cognitive communication deficit (R41.841)    Follow Up Recommendations  Other (comment);Outpatient SLP;Inpatient Rehab;Home health SLP(? may progress quickly for CIR)    Frequency and Duration min 2x/week  2 weeks      SLP Evaluation Cognition  Overall Cognitive Status: Impaired/Different from baseline Arousal/Alertness: Lethargic Orientation Level: (no response to y/n questions) Attention: Sustained Sustained Attention: Impaired Sustained Attention Impairment: Verbal basic;Functional basic Memory: (TBA) Awareness: Impaired Awareness Impairment: Emergent impairment;Anticipatory impairment Problem Solving: Impaired Problem Solving Impairment: Functional basic Behaviors: Restless;Impulsive Safety/Judgment: Impaired       Comprehension  Auditory Comprehension Overall Auditory Comprehension: (did not follow commands, d/t cognitive impairments?) Yes/No Questions: (no response) Commands: (no response) Interfering Components: Attention;Processing speed Visual Recognition/Discrimination Discrimination: Not tested Reading Comprehension Reading Status: (TBA)    Expression Expression Primary Mode of Expression: Verbal Verbal Expression Overall Verbal Expression: Impaired(? sig decreased intelligibility) Initiation: Impaired Automatic Speech: (no response count, poor attention) Pragmatics: Impairment Impairments: Abnormal affect;Eye contact Interfering Components: Attention;Speech intelligibility Written Expression Dominant Hand: Right Written Expression: (TBA)   Oral / Motor  Oral Motor/Sensory Function Overall Oral Motor/Sensory Function: Other (comment)(did not follow commands, no sig focal weakness) Motor Speech Overall Motor Speech: Impaired Respiration: Within functional limits Phonation: Low vocal intensity Resonance: Within functional limits Articulation: (?) Intelligibility: Intelligibility reduced Word: 0-24% accurate Phrase: 0-24% accurate Motor  Planning: Witnin functional limits   GO                    Royce Macadamia 02/07/2019, 12:07 PM  Breck Coons Olumide Dolinger M.Ed Nurse, children's [phone] Office [phone]

## 2019-02-07 NOTE — Progress Notes (Signed)
  Echocardiogram 2D Echocardiogram has been performed.  Leta Jungling M 02/07/2019, 10:25 AM

## 2019-02-07 NOTE — Progress Notes (Signed)
  NEUROSURGERY PROGRESS NOTE   No issues overnight.   EXAM:  BP 118/80   Pulse (!) 101   Temp 100.2 F (37.9 C) (Axillary)   Resp (!) 21   Ht 5' (1.524 m)   Wt 49.6 kg   SpO2 94%   BMI 21.36 kg/m   Arouses to voice Does not respond verbally Left pupil 39mm, minimally reactive Right pupil 18mm, reactive Not following commands Moves all extremities purposefully  IMAGING: MRI/MRA reviewed demonstrating left frontal convexity hematoma. No identifiable underlying mass lesion. Also seen are bilateral R>L temporo-polar contusions. MRA does not demonstrate any vascular malformation. ? RICA aneurysm.  IMPRESSION:  78 y.o. female s/p fall with traumatic ICH and resultant aphasia/concussion. Does not require operative treatment  PLAN: - Cont supportive care  I have reviewed the above with the patient's family at bedside. All questions were answered.

## 2019-02-07 NOTE — Consult Note (Signed)
NAME:  Danielle Clarke, MRN:  [id number], DOB:  1940/12/10, LOS: 0 ADMISSION DATE:  02/06/2019, CONSULTATION DATE:  02/07/2019 REFERRING MD:  Dr. Toniann Fail, CHIEF COMPLAINT:  SAH/ SDH/ HTN  Brief History   [name] with syncope episode from standing position, falling back hitting head. Not on blood thinners. Resultant traumatic SDH/ SAH/ IPH bleed.  NSGY following- recommends for monitoring and blood pressure control.  Initially admitted to [facility], PCCM asked to consult for strict SBP control.   History of present illness   HPI obtained from medical chart review as patient is aphasic.   78 year old female with history of IDDM, HTN, aortic stenosis, bilateral carotid disease, HLD, osteoporosis, CKD, and glaucoma admitted by [facility] on 3/13 for syncope. Patient was at the dentist post routine dental cleaning, checking out when she had syncopal episodes falling backwards. Only takes aspirin at home.  Of note, on chart review, patient has prior history of recurrent syncope, follwed by   On arrival in ER, patient had difficulty talking, confusion but non-focal .  Patient has been hypertensive since arrival.  CT head showing left frontal intraparenchymal hematoma with small volume mixed SAH/ SDH over bilateral convexities left greater than right. CT cervical negative.  Neurosurgery consulted with no indication for acute intervention.  MRI/ MRA pending.  Labs noted for glucose 173, WBC 11.4, Hgb 11.4 and EKG noted for new inferior Q waves.  Admitted to [facility] to stepdown.  PCCM consulted for ICU transfer for strict blood pressure control.  Past Medical History  IDDM, HTN, aortic stenosis, bilateral carotid disease, CKD, glaucoma, HLD, osteoporosis,   Significant Hospital Events   3/12 Admit  Consults:  NSGY  Procedures:   Significant Diagnostic Tests:  3/12 CT head, cervical >> 1. Left frontal intraparenchymal hematoma with volume approximately 10 mL and mild surrounding edema with small volume  subarachnoid/subdural blood over both convexities, left-greater-than-right. 2. Posterior right parietal scalp hematoma, consistent with a contrecoup pattern of injury. 3. No acute fracture or static subluxation of the cervical spine. 4. 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  3/12 CT abd/ pelvis >> 1. No acute posttraumatic changes demonstrated in the chest, abdomen, or pelvis. 2. Atelectasis in the lung bases. 3. No evidence of mediastinal injury or pulmonary parenchymal injury. 4. No evidence of solid organ injury or bowel perforation. 5. Thoracolumbar scoliosis and degenerative changes. 6. Calcified uterine fibroids. 7. Aortic atherosclerosis.  3/13 MRI brain >> 1. Interval expansion of left frontal intraparenchymal hematoma, now measuring up to 40 cc in volume (previously 10 cc). Localized edema and regional mass effect mildly increased. No underlying mass lesion or other abnormality. 2. Additional small hemorrhagic contusions at the anterior temporal poles bilaterally as above. 3. Moderate volume traumatic subarachnoid hemorrhage involving the left greater than right cerebral hemispheres with small left greater than right subdural hematomas. Associated mild 4 mm left-to-right midline shift. No hydrocephalus or ventricular trapping. 4. Evolving right parietal scalp contusion. 5. Otherwise negative brain MRI. No other findings to explain intracranial hemorrhage identified.  3/13 MRA brain >> 1. Technically limited exam due to motion artifact. 2. No vascular abnormality underlying the left frontal intraparenchymal hematoma identified. 3. 3 mm focal outpouching arising from the cavernous right ICA, which could reflect a small aneurysm versus vascular infundibulum, difficult to be certain given motion artifact on this exam. Either way, finding felt to most likely be incidental in nature, and unrelated to the acute intracranial hemorrhage. 4. Atherosclerotic change  within the  carotid siphons with associated moderate multifocal stenoses as above.  Micro Data:  3/13 MRSA PCR >>  Antimicrobials:   Interim history/subjective:   Objective   Blood pressure (!) 168/81, pulse 75, temperature 97.8 F (36.6 C), temperature source Oral, resp. rate 13, height 5' (1.524 m), weight 49.4 kg, SpO2 95 %.    Intake/Output Summary (Last 24 hours) at 02/07/2019 0050 Last data filed at 02/06/2019 2317 Gross per 24 hour  Intake 500 ml  Output --  Net 500 ml   Filed Weights   02/06/19 1747  Weight: 49.4 kg   Examination: General: Elderly female lying in bed initially in NAD.  During exam, had episode of vomiting. HEENT: MM pink/dry, left pupil 4/sluggish, right 3/ sluggish, posterior hematoma, slight ooze  Neuro: Awake, MAE, slow but follows commands, speaks few incomprehensible speech CV: rr, loud systolic murmur PULM: even/non-labored, lungs bilaterally clear GI: soft, BS +, purwick in place Extremities: warm/dry, no edema  Skin: no rashes  Resolved Hospital Problem list    Assessment & Plan:  Traumatic left frontal intraparenchymal hematoma with mixed SAH/ SDH related to fall/ syncopal episode - on ASA daily only P:  MRA/ MRI- showing interval expansion of left frontal intraparenchymal hematoma, and new 4mm left to right midline shift.  Additionally patient now with new vomiting with vagal episodes, and unclear pupillary changes, L>R.  Neurosurgery contacted/ notified.  Will send now for stat CTH. D/c labetalol Continue strict SBP control 120-150 with cardene Insert Aline Hourly neuro exams HOB >30 NPO Transfuse 1 pack of platelets now Check coags  Syncope - prior recurrent syncope noted, but apparently doing well since 2018 - electrolytes wnl - no resp distress/ hypoxia P:  Tele monitoring for arrhythmias TTE - last TTE 07/2017 with mild AS, suspect this has worsened given her loud murmur and looking for wall motion abnormalities  Assess/  trend troponin and trend EKG given new inferior Q waves  Will need carotid ultrasounds Check mag  Hypertensive Urgency - of note, no meds for hypertension given prior to PCCM exam P:  cardene as above for strict SBP control  Hold ASA  Leukocytosis - probable reactive as patient was in normal health prior to syncope, has been afebrile P:  Send UA Assess CXR Trend WBC/ fever curve   Chronic normocytic Anemia P:  Trend CBC  HLD P:  Hold zocor while NPO  IDDM P:  CBG q 4 SSI sensitive lantus 10 units daily   Best practice:  Diet: NPO Pain/Anxiety/Delirium protocol (if indicated): n/a  VAP protocol (if indicated): n/a DVT prophylaxis: SCDs only GI prophylaxis: n/a Glucose control: CBG q 4, SSI Mobility: BR Code Status: Full Family Communication: no family at bedside Disposition: tx to ICU  Labs   CBC: Recent Labs  Lab 02/06/19 1957  WBC 11.4*  NEUTROABS 9.4*  HGB 11.4*  HCT 35.9*  MCV 92.1  PLT 273    Basic Metabolic Panel: Recent Labs  Lab 02/06/19 1957 02/06/19 2004  NA 135  --   K 4.1  --   CL 98  --   CO2 29  --   GLUCOSE 173*  --   BUN 20  --   CREATININE 0.84 0.80  CALCIUM 8.8*  --    GFR: Estimated Creatinine Clearance: 43 mL/min (by C-G formula based on SCr of 0.8 mg/dL). Recent Labs  Lab 02/06/19 1957  WBC 11.4*    Liver Function Tests: Recent Labs  Lab 02/06/19 1957  AST 39  ALT 24  ALKPHOS 70  BILITOT 0.5  PROT 5.9*  ALBUMIN 3.6   No results for input(s): LIPASE, AMYLASE in the last 168 hours. No results for input(s): AMMONIA in the last 168 hours.  ABG    Component Value Date/Time   TCO2 30 12/18/2009 2224     Coagulation Profile: No results for input(s): INR, PROTIME in the last 168 hours.  Cardiac Enzymes: No results for input(s): CKTOTAL, CKMB, CKMBINDEX, TROPONINI in the last 168 hours.  HbA1C: Hgb A1c MFr Bld  Date/Time Value Ref Range Status  01/04/2017 06:21 PM 7.9 (H) 4.8 - 5.6 % Final     Comment:    (NOTE)         Pre-diabetes: 5.7 - 6.4         Diabetes: >6.4         Glycemic control for adults with diabetes: <7.0     CBG: Recent Labs  Lab 02/06/19 1924 02/06/19 2314  GLUCAP 151* 192*    Review of Systems:   Unable  Past Medical History  She,  has a past medical history of Arthritis, Asthmatic bronchitis, Cataract, Cutaneous abscess of right foot, Glaucoma, Heart murmur, Hyperlipemia, Hypertension, Neck fracture (HCC), Osteoporosis, and Type 1 diabetes mellitus (HCC).   Surgical History    Past Surgical History:  Procedure Laterality Date   ANKLE FRACTURE SURGERY Left 2001   steel plate and 3 screws    ANTERIOR CERVICAL CORPECTOMY N/A 07/31/2014   Procedure: Cervical Four to Cervical Six Corpectomy;  Surgeon: Karn Cassis, MD;  Location: [facility];  Service: Neurosurgery;  Laterality: N/A;  C4 to C6 Corpectomy   BREAST SURGERY  1988   CATARACT EXTRACTION W/ INTRAOCULAR LENS  IMPLANT, BILATERAL Bilateral 2011   right and then left   EYE SURGERY     FRACTURE SURGERY     HAMMER TOE SURGERY  1998   I&D EXTREMITY Right 09/27/2018   Procedure: IRRIGATION AND DEBRIDEMENT RIGHT FOOT;  Surgeon: Nadara Mustard, MD;  Location: [facility] OR;  Service: Orthopedics;  Laterality: Right;   JOINT REPLACEMENT     TONSILLECTOMY AND ADENOIDECTOMY  1948   TOTAL SHOULDER REPLACEMENT  2010   right shoulder    TUBAL LIGATION  1979   TYMPANOSTOMY TUBE PLACEMENT Bilateral      Social History   reports that she has never smoked. She has never used smokeless tobacco. She reports current alcohol use of about 7.0 standard drinks of alcohol per week.   Family History   Her family history includes Cancer in her mother; Heart Problems in her father.   Allergies Allergies  Allergen Reactions   Cefaclor Anaphylaxis   Tetracycline Anaphylaxis   Vancomycin Anaphylaxis   Augmentin [Amoxicillin-Pot Clavulanate] Other (See Comments)    Reaction unknown >>  Anaphylaxis Has patient had a PCN reaction causing immediate rash, facial/tongue/throat swelling, SOB or lightheadedness with hypotension: Unknown Has patient had a PCN reaction causing severe rash involving mucus membranes or skin necrosis: Unknown Has patient had a PCN reaction that required hospitalization: Unknown Has patient had a PCN reaction occurring within the last 10 years: Unknown If all of the above answers are "NO", then may proceed with Cephalosporin use.    Prednisone Other (See Comments)    Reaction unknown     Home Medications  Prior to Admission medications   Medication Sig Start Date End Date Taking? Authorizing Provider  acetaminophen (TYLENOL) 500 MG tablet Take 500 mg by mouth 4 (four) times  daily.    Yes [provider]  aspirin EC 81 MG tablet Take 81 mg by mouth daily.   Yes [provider]  Chromium Picolinate 1000 MCG TABS Take 1,000 mcg by mouth daily at 12 noon.    Yes [provider]  CINNAMON PO Take 1,000 mg by mouth 4 (four) times daily.   Yes [provider]  HUMALOG KWIKPEN 100 UNIT/ML KiwkPen Inject 2-13 Units into the skin 4 (four) times daily. Sliding Scale Insulin 07/30/18  Yes [provider]  Multiple Vitamin (MULTIVITAMIN WITH MINERALS) TABS tablet Take 1 tablet by mouth daily.   Yes [provider]  Probiotic Product (PROBIOTIC PO) Take 1 tablet by mouth daily.   Yes [provider]  simvastatin (ZOCOR) 40 MG tablet Take 40 mg by mouth daily at 8 pm.    Yes [provider]  traMADol (ULTRAM) 50 MG tablet Take 1 tablet (50 mg total) by mouth every 6 (six) hours as needed for moderate pain. 09/27/18  Yes Nadara Mustard, MD  TRESIBA 100 UNIT/ML SOLN Inject 10 Units into the skin daily.  07/31/18  Yes [provider]  Turmeric 500 MG CAPS Take 500 mg by mouth daily at 12 noon.    Yes [provider]  albuterol (PROVENTIL HFA;VENTOLIN HFA) 108 (90 Base) MCG/ACT inhaler  Inhale 2 puffs into the lungs every 6 (six) hours as needed for wheezing or shortness of breath. Patient not taking: Reported on 02/06/2019 08/08/17   Regalado, Jon Billings A, MD  clindamycin (CLEOCIN) 300 MG capsule Take 1 capsule (300 mg total) by mouth 3 (three) times daily. Patient not taking: Reported on 02/06/2019 10/08/18   Rayburn, Fanny Bien, PA-C  mupirocin ointment (BACTROBAN) 2 % Apply 1 application topically daily. Apply to right foot incision daily Patient not taking: Reported on 02/06/2019 10/04/18   Rayburn, Fanny Bien, PA-C  ondansetron (ZOFRAN ODT) 4 MG disintegrating tablet Take 1 tablet (4 mg total) by mouth every 8 (eight) hours as needed for nausea or vomiting. Patient not taking: Reported on 02/06/2019 12/21/18   Dahlia Byes A, NP  ondansetron (ZOFRAN) 4 MG tablet Take 1 tablet (4 mg total) by mouth every 8 (eight) hours as needed for nausea or vomiting. Patient not taking: Reported on 02/06/2019 10/09/18   Rayburn, Fanny Bien, PA-C  sulfamethoxazole-trimethoprim (BACTRIM DS,SEPTRA DS) 800-160 MG tablet Take 1 tablet by mouth 2 (two) times daily. Patient not taking: Reported on 02/06/2019 09/26/18   Adonis Huguenin, NP  sulfamethoxazole-trimethoprim (BACTRIM DS,SEPTRA DS) 800-160 MG tablet Take 1 tablet by mouth 2 (two) times daily. Patient not taking: Reported on 02/06/2019 09/27/18   Nadara Mustard, MD  triamcinolone (KENALOG) 0.025 % ointment Apply 1 application topically 2 (two) times daily. Patient not taking: Reported on 02/06/2019 12/21/18   Janace Aris, NP     Critical care time: 45 min    Posey Boyer, MSN, AGACNP-BC [facility] Pgr: [phone] or if no answer [phone] 02/07/2019, 3:00 AM

## 2019-02-07 NOTE — Progress Notes (Signed)
More unsustainedl bradycardia with vomiting. MD CCM notified. Pads on, platelets infusing.

## 2019-02-07 NOTE — H&P (Signed)
History and Physical    BYRLE SUNDERMEYER CXK:[id number] DOB: 08/17/1941 DOA: 02/06/2019  PCP: Juluis Rainier, MD  Patient coming from: Home.  History obtained from patient's daughter and ER physician.  Chief Complaint: Fall.  Loss of consciousness.  HPI: Danielle Clarke is a 78 y.o. female with history of diabetes mellitus type 1, hyperlipidemia was brought to the ER after patient had a syncopal episode at the dentist office.  Patient's daughter states that patient had a routine dental visit following which at around time for checking out patient suddenly slammed on and lost consciousness.  Hit her head.  Did not have any seizure-like activities or incontinence of urine.  Did not bite her tongue.  Blood sugar was found to be more than 200.  Per patient's daughter patient regained consciousness within a minute.  Patient was brought to the ER by EMS.  ED Course: In the ER patient initially had some difficulty talking.  Patient was moving all extremities.  Was alert awake and oriented.  CT of the head C-spine chest abdomen and pelvis was done.  CT head showed left frontal intraparenchymal hematoma and subarachnoid and subdural hematoma in both convexities.  Neurosurgery was consulted and they have placed orders for MRI and MRA brain.  Patient admitted for further management.  Patient did complain of some headache and was given fentanyl following which patient did have nausea vomiting.  Abdomen appears benign.  Review of Systems: As per HPI, rest all negative.   Past Medical History:  Diagnosis Date   Arthritis    Asthmatic bronchitis    with colds per patient   Cataract    Cutaneous abscess of right foot    Glaucoma    Heart murmur    Dr Katrinka Blazing is her  cardiologist.    Hyperlipemia    Hypertension    Dr. Katrinka Blazing ~ 2 years ago   Neck fracture [facility])    july 2013   Osteoporosis    Type 1 diabetes mellitus [facility])     Past Surgical History:  Procedure Laterality Date     ANKLE FRACTURE SURGERY Left 2001   steel plate and 3 screws    ANTERIOR CERVICAL CORPECTOMY N/A 07/31/2014   Procedure: Cervical Four to Cervical Six Corpectomy;  Surgeon: Karn Cassis, MD;  Location: [facility];  Service: Neurosurgery;  Laterality: N/A;  C4 to C6 Corpectomy   BREAST SURGERY  1988   CATARACT EXTRACTION W/ INTRAOCULAR LENS  IMPLANT, BILATERAL Bilateral 2011   right and then left   EYE SURGERY     FRACTURE SURGERY     HAMMER TOE SURGERY  1998   I&D EXTREMITY Right 09/27/2018   Procedure: IRRIGATION AND DEBRIDEMENT RIGHT FOOT;  Surgeon: Nadara Mustard, MD;  Location: [facility] OR;  Service: Orthopedics;  Laterality: Right;   JOINT REPLACEMENT     TONSILLECTOMY AND ADENOIDECTOMY  1948   TOTAL SHOULDER REPLACEMENT  2010   right shoulder    TUBAL LIGATION  1979   TYMPANOSTOMY TUBE PLACEMENT Bilateral      reports that she has never smoked. She has never used smokeless tobacco. She reports current alcohol use of about 7.0 standard drinks of alcohol per week. No history on file for drug.  Allergies  Allergen Reactions   Cefaclor Anaphylaxis   Tetracycline Anaphylaxis   Vancomycin Anaphylaxis   Augmentin [Amoxicillin-Pot Clavulanate] Other (See Comments)    Reaction unknown >> Anaphylaxis Has patient had a PCN reaction causing immediate rash, facial/tongue/throat  swelling, SOB or lightheadedness with hypotension: Unknown Has patient had a PCN reaction causing severe rash involving mucus membranes or skin necrosis: Unknown Has patient had a PCN reaction that required hospitalization: Unknown Has patient had a PCN reaction occurring within the last 10 years: Unknown If all of the above answers are "NO", then may proceed with Cephalosporin use.    Prednisone Other (See Comments)    Reaction unknown    Family History  Problem Relation Age of Onset   Cancer Mother    Heart Problems Father     Prior to Admission medications   Medication Sig Start  Date End Date Taking? Authorizing Provider  acetaminophen (TYLENOL) 500 MG tablet Take 500 mg by mouth 4 (four) times daily.    Yes [provider]  aspirin EC 81 MG tablet Take 81 mg by mouth daily.   Yes [provider]  Chromium Picolinate 1000 MCG TABS Take 1,000 mcg by mouth daily at 12 noon.    Yes [provider]  CINNAMON PO Take 1,000 mg by mouth 4 (four) times daily.   Yes [provider]  HUMALOG KWIKPEN 100 UNIT/ML KiwkPen Inject 2-13 Units into the skin 4 (four) times daily. Sliding Scale Insulin 07/30/18  Yes [provider]  Multiple Vitamin (MULTIVITAMIN WITH MINERALS) TABS tablet Take 1 tablet by mouth daily.   Yes [provider]  Probiotic Product (PROBIOTIC PO) Take 1 tablet by mouth daily.   Yes [provider]  simvastatin (ZOCOR) 40 MG tablet Take 40 mg by mouth daily at 8 pm.    Yes [provider]  traMADol (ULTRAM) 50 MG tablet Take 1 tablet (50 mg total) by mouth every 6 (six) hours as needed for moderate pain. 09/27/18  Yes Nadara Mustard, MD  TRESIBA 100 UNIT/ML SOLN Inject 10 Units into the skin daily.  07/31/18  Yes [provider]  Turmeric 500 MG CAPS Take 500 mg by mouth daily at 12 noon.    Yes [provider]  albuterol (PROVENTIL HFA;VENTOLIN HFA) 108 (90 Base) MCG/ACT inhaler Inhale 2 puffs into the lungs every 6 (six) hours as needed for wheezing or shortness of breath. Patient not taking: Reported on 02/06/2019 08/08/17   Regalado, Jon Billings A, MD  clindamycin (CLEOCIN) 300 MG capsule Take 1 capsule (300 mg total) by mouth 3 (three) times daily. Patient not taking: Reported on 02/06/2019 10/08/18   Rayburn, Fanny Bien, PA-C  mupirocin ointment (BACTROBAN) 2 % Apply 1 application topically daily. Apply to right foot incision daily Patient not taking: Reported on 02/06/2019 10/04/18   Rayburn, Fanny Bien, PA-C  ondansetron (ZOFRAN ODT) 4 MG disintegrating tablet Take 1  tablet (4 mg total) by mouth every 8 (eight) hours as needed for nausea or vomiting. Patient not taking: Reported on 02/06/2019 12/21/18   Dahlia Byes A, NP  ondansetron (ZOFRAN) 4 MG tablet Take 1 tablet (4 mg total) by mouth every 8 (eight) hours as needed for nausea or vomiting. Patient not taking: Reported on 02/06/2019 10/09/18   Rayburn, Fanny Bien, PA-C  sulfamethoxazole-trimethoprim (BACTRIM DS,SEPTRA DS) 800-160 MG tablet Take 1 tablet by mouth 2 (two) times daily. Patient not taking: Reported on 02/06/2019 09/26/18   Adonis Huguenin, NP  sulfamethoxazole-trimethoprim (BACTRIM DS,SEPTRA DS) 800-160 MG tablet Take 1 tablet by mouth 2 (two) times daily. Patient not taking: Reported on 02/06/2019 09/27/18   Nadara Mustard, MD  triamcinolone (KENALOG) 0.025 % ointment Apply 1 application topically 2 (two) times daily.  Patient not taking: Reported on 02/06/2019 12/21/18   Janace Aris, NP    Physical Exam: Vitals:   02/06/19 2230 02/06/19 2300 02/06/19 2315 02/07/19 0030  BP: (!) 170/51 (!) 183/52  (!) 168/81  Pulse: 73 75 75   Resp: Temp:      TempSrc:      SpO2: 94% 93% 95%   Weight:      Height:          Constitutional: Moderately built and nourished. Vitals:   02/06/19 2230 02/06/19 2300 02/06/19 2315 02/07/19 0030  BP: (!) 170/51 (!) 183/52  (!) 168/81  Pulse: 73 75 75   Resp: Temp:      TempSrc:      SpO2: 94% 93% 95%   Weight:      Height:       Eyes: Anicteric no pallor. ENMT: No discharge from the ears eyes nose and mouth. Neck: No mass felt.  No neck rigidity. Respiratory: No rhonchi or crepitations. Cardiovascular: S1-S2 heard. Abdomen: Soft nontender bowel sounds present. Musculoskeletal: No edema.  No joint effusion. Skin: No rash. Neurologic: Alert awake oriented to her name follows commands and moves all extremities.  Pupils are equal and reacting to light. Psychiatric: Oriented to her name.   Labs on Admission: I have  personally reviewed following labs and imaging studies  CBC: Recent Labs  Lab 02/06/19 1957  WBC 11.4*  NEUTROABS 9.4*  HGB 11.4*  HCT 35.9*  MCV 92.1  PLT 273   Basic Metabolic Panel: Recent Labs  Lab 02/06/19 1957 02/06/19 2004  NA 135  --   K 4.1  --   CL 98  --   CO2 29  --   GLUCOSE 173*  --   BUN 20  --   CREATININE 0.84 0.80  CALCIUM 8.8*  --    GFR: Estimated Creatinine Clearance: 43 mL/min (by C-G formula based on SCr of 0.8 mg/dL). Liver Function Tests: Recent Labs  Lab 02/06/19 1957  AST 39  ALT 24  ALKPHOS 70  BILITOT 0.5  PROT 5.9*  ALBUMIN 3.6   No results for input(s): LIPASE, AMYLASE in the last 168 hours. No results for input(s): AMMONIA in the last 168 hours. Coagulation Profile: No results for input(s): INR, PROTIME in the last 168 hours. Cardiac Enzymes: No results for input(s): CKTOTAL, CKMB, CKMBINDEX, TROPONINI in the last 168 hours. BNP (last 3 results) No results for input(s): PROBNP in the last 8760 hours. HbA1C: No results for input(s): HGBA1C in the last 72 hours. CBG: Recent Labs  Lab 02/06/19 1924 02/06/19 2314  GLUCAP 151* 192*   Lipid Profile: No results for input(s): CHOL, HDL, LDLCALC, TRIG, CHOLHDL, LDLDIRECT in the last 72 hours. Thyroid Function Tests: No results for input(s): TSH, T4TOTAL, FREET4, T3FREE, THYROIDAB in the last 72 hours. Anemia Panel: No results for input(s): VITAMINB12, FOLATE, FERRITIN, TIBC, IRON, RETICCTPCT in the last 72 hours. Urine analysis:    Component Value Date/Time   COLORURINE STRAW (A) 01/04/2017 2100   APPEARANCEUR CLEAR 01/04/2017 2100   LABSPEC 1.010 01/04/2017 2100   PHURINE 7.0 01/04/2017 2100   GLUCOSEU NEGATIVE 01/04/2017 2100   HGBUR NEGATIVE 01/04/2017 2100   BILIRUBINUR NEGATIVE 01/04/2017 2100   KETONESUR NEGATIVE 01/04/2017 2100   PROTEINUR NEGATIVE 01/04/2017 2100   UROBILINOGEN 0.2 10/07/2014 1808   NITRITE NEGATIVE 01/04/2017 2100   LEUKOCYTESUR NEGATIVE  01/04/2017 2100   Sepsis Labs: (procalcitonin:4,lacticidven:4) )No  results found for this or any previous visit (from the past 240 hour(s)).   Radiological Exams on Admission: Ct Head Wo Contrast  Result Date: 02/06/2019 CLINICAL DATA:  Fall EXAM: CT HEAD WITHOUT CONTRAST CT CERVICAL SPINE WITHOUT CONTRAST TECHNIQUE: Multidetector CT imaging of the head and cervical spine was performed following the standard protocol without intravenous contrast. Multiplanar CT image reconstructions of the cervical spine were also generated. COMPARISON:  Head CT 01/04/2017 FINDINGS: CT HEAD FINDINGS Brain: There is a left frontal intraparenchymal hematoma measuring 2.2 x 2.2 x 3.9 cm (volume = 9.9 cm^3) with moderate surrounding edema. There is mixed subdural and subarachnoid blood over the left convexity, extending into the sylvian fissure. Smaller amount of subarachnoid blood the right sylvian fissure. No midline shift or other significant mass effect. No hydrocephalus. Vascular: No abnormal hyperdensity of the major intracranial arteries or dural venous sinuses. No intracranial atherosclerosis. Skull: Large right parietal posterior scalp hematoma. Sinuses/Orbits: No fluid levels or advanced mucosal thickening of the visualized paranasal sinuses. No mastoid or middle ear effusion. The orbits are normal. CT CERVICAL SPINE FINDINGS Alignment: No static subluxation. Facets are aligned. Occipital condyles are normally positioned. Skull base and vertebrae: There is an old type 2 fracture of the dens with chronic nonunion. There is there is C3-C6 anterior fusion with solid anterior fusion mass. No acute fracture. Soft tissues and spinal canal: No prevertebral fluid or swelling. No visible canal hematoma. Disc levels: No bony spinal canal stenosis. Upper chest: No pneumothorax, pulmonary nodule or pleural effusion. Other: Normal visualized paraspinal cervical soft tissues. IMPRESSION: 1. Left frontal intraparenchymal  hematoma with volume approximately 10 mL and mild surrounding edema with small volume subarachnoid/subdural blood over both convexities, left-greater-than-right. 2. Posterior right parietal scalp hematoma, consistent with a contrecoup pattern of injury. 3. No acute fracture or static subluxation of the cervical spine. 4. 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 Critical Value/emergent results were called by telephone at the time of interpretation on 02/06/2019 at 8:41 pm to Dr. Mancel Bale , who verbally acknowledged these results. Electronically Signed   By: Deatra Robinson M.D.   On: 02/06/2019 20:47   Ct Chest W Contrast  Result Date: 02/06/2019 CLINICAL DATA:  Blunt abdominal trauma. EXAM: CT CHEST, ABDOMEN, AND PELVIS WITH CONTRAST TECHNIQUE: Multidetector CT imaging of the chest, abdomen and pelvis was performed following the standard protocol during bolus administration of intravenous contrast. CONTRAST:  OMNIPAQUE IOHEXOL 300 MG/ML  SOLN COMPARISON:  CT chest 08/06/2017. FINDINGS: CT CHEST FINDINGS Cardiovascular: Normal heart size. No pericardial effusions. Coronary artery and mitral valve annulus calcifications. Normal caliber thoracic aorta with calcifications. No aortic dissection. Central pulmonary arteries are well opacified without evidence of significant pulmonary embolus. Mediastinum/Nodes: Esophagus is decompressed. No significant lymphadenopathy in the chest. No abnormal mediastinal gas or fluid collections. Lungs/Pleura: Atelectasis in the lung bases. No focal consolidation. No pleural effusions. No pneumothorax. Airways are patent. Musculoskeletal: Postoperative changes with lower cervical anterior fixation and right shoulder arthroplasty. Thoracolumbar scoliosis convex towards the right in the thoracic region and towards the left in the lumbar region. Diffuse degenerative changes throughout the thoracic spine. No vertebral compression deformities. Sternum and ribs  appear intact. No acute displaced fractures identified. CT ABDOMEN PELVIS FINDINGS Hepatobiliary: No hepatic injury or perihepatic hematoma. Gallbladder is unremarkable Pancreas: Unremarkable. No pancreatic ductal dilatation or surrounding inflammatory changes. Spleen: No splenic injury or perisplenic hematoma. Adrenals/Urinary Tract: No adrenal hemorrhage or renal injury identified. Bladder is unremarkable. Stomach/Bowel: Stomach, small  bowel, and colon are not abnormally distended. Stool fills the colon. No wall thickening or inflammatory changes appreciated. No mesenteric hematoma or gas collections. Appendix is not identified. Vascular/Lymphatic: Aortic atherosclerosis. No enlarged abdominal or pelvic lymph nodes. Reproductive: Uterus and ovaries are not enlarged. Calcifications in the uterus likely representing calcified fibroids. Other: No free air or free fluid in the abdomen. Abdominal wall musculature appears intact. Musculoskeletal: Thoracolumbar scoliosis. No vertebral compression deformities. Degenerative changes throughout the lumbar spine with narrowed interspaces and endplate hypertrophic changes. Sacrum, pelvis, and hips appear intact. No acute fractures are identified. IMPRESSION: 1. No acute posttraumatic changes demonstrated in the chest, abdomen, or pelvis. 2. Atelectasis in the lung bases. 3. No evidence of mediastinal injury or pulmonary parenchymal injury. 4. No evidence of solid organ injury or bowel perforation. 5. Thoracolumbar scoliosis and degenerative changes. 6. Calcified uterine fibroids. 7. Aortic atherosclerosis. Electronically Signed   By: Burman Nieves M.D.   On: 02/06/2019 20:53   Ct Cervical Spine Wo Contrast  Result Date: 02/06/2019 CLINICAL DATA:  Fall EXAM: CT HEAD WITHOUT CONTRAST CT CERVICAL SPINE WITHOUT CONTRAST TECHNIQUE: Multidetector CT imaging of the head and cervical spine was performed following the standard protocol without intravenous contrast. Multiplanar  CT image reconstructions of the cervical spine were also generated. COMPARISON:  Head CT 01/04/2017 FINDINGS: CT HEAD FINDINGS Brain: There is a left frontal intraparenchymal hematoma measuring 2.2 x 2.2 x 3.9 cm (volume = 9.9 cm^3) with moderate surrounding edema. There is mixed subdural and subarachnoid blood over the left convexity, extending into the sylvian fissure. Smaller amount of subarachnoid blood the right sylvian fissure. No midline shift or other significant mass effect. No hydrocephalus. Vascular: No abnormal hyperdensity of the major intracranial arteries or dural venous sinuses. No intracranial atherosclerosis. Skull: Large right parietal posterior scalp hematoma. Sinuses/Orbits: No fluid levels or advanced mucosal thickening of the visualized paranasal sinuses. No mastoid or middle ear effusion. The orbits are normal. CT CERVICAL SPINE FINDINGS Alignment: No static subluxation. Facets are aligned. Occipital condyles are normally positioned. Skull base and vertebrae: There is an old type 2 fracture of the dens with chronic nonunion. There is there is C3-C6 anterior fusion with solid anterior fusion mass. No acute fracture. Soft tissues and spinal canal: No prevertebral fluid or swelling. No visible canal hematoma. Disc levels: No bony spinal canal stenosis. Upper chest: No pneumothorax, pulmonary nodule or pleural effusion. Other: Normal visualized paraspinal cervical soft tissues. IMPRESSION: 1. Left frontal intraparenchymal hematoma with volume approximately 10 mL and mild surrounding edema with small volume subarachnoid/subdural blood over both convexities, left-greater-than-right. 2. Posterior right parietal scalp hematoma, consistent with a contrecoup pattern of injury. 3. No acute fracture or static subluxation of the cervical spine. 4. 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 Critical Value/emergent results were called by telephone at the time of interpretation on 02/06/2019  at 8:41 pm to Dr. Mancel Bale , who verbally acknowledged these results. Electronically Signed   By: Deatra Robinson M.D.   On: 02/06/2019 20:47   Ct Abdomen Pelvis W Contrast  Result Date: 02/06/2019 CLINICAL DATA:  Blunt abdominal trauma. EXAM: CT CHEST, ABDOMEN, AND PELVIS WITH CONTRAST TECHNIQUE: Multidetector CT imaging of the chest, abdomen and pelvis was performed following the standard protocol during bolus administration of intravenous contrast. CONTRAST:  OMNIPAQUE IOHEXOL 300 MG/ML  SOLN COMPARISON:  CT chest 08/06/2017. FINDINGS: CT CHEST FINDINGS Cardiovascular: Normal heart size. No pericardial effusions. Coronary artery and mitral valve annulus calcifications. Normal caliber  thoracic aorta with calcifications. No aortic dissection. Central pulmonary arteries are well opacified without evidence of significant pulmonary embolus. Mediastinum/Nodes: Esophagus is decompressed. No significant lymphadenopathy in the chest. No abnormal mediastinal gas or fluid collections. Lungs/Pleura: Atelectasis in the lung bases. No focal consolidation. No pleural effusions. No pneumothorax. Airways are patent. Musculoskeletal: Postoperative changes with lower cervical anterior fixation and right shoulder arthroplasty. Thoracolumbar scoliosis convex towards the right in the thoracic region and towards the left in the lumbar region. Diffuse degenerative changes throughout the thoracic spine. No vertebral compression deformities. Sternum and ribs appear intact. No acute displaced fractures identified. CT ABDOMEN PELVIS FINDINGS Hepatobiliary: No hepatic injury or perihepatic hematoma. Gallbladder is unremarkable Pancreas: Unremarkable. No pancreatic ductal dilatation or surrounding inflammatory changes. Spleen: No splenic injury or perisplenic hematoma. Adrenals/Urinary Tract: No adrenal hemorrhage or renal injury identified. Bladder is unremarkable. Stomach/Bowel: Stomach, small bowel, and colon are not  abnormally distended. Stool fills the colon. No wall thickening or inflammatory changes appreciated. No mesenteric hematoma or gas collections. Appendix is not identified. Vascular/Lymphatic: Aortic atherosclerosis. No enlarged abdominal or pelvic lymph nodes. Reproductive: Uterus and ovaries are not enlarged. Calcifications in the uterus likely representing calcified fibroids. Other: No free air or free fluid in the abdomen. Abdominal wall musculature appears intact. Musculoskeletal: Thoracolumbar scoliosis. No vertebral compression deformities. Degenerative changes throughout the lumbar spine with narrowed interspaces and endplate hypertrophic changes. Sacrum, pelvis, and hips appear intact. No acute fractures are identified. IMPRESSION: 1. No acute posttraumatic changes demonstrated in the chest, abdomen, or pelvis. 2. Atelectasis in the lung bases. 3. No evidence of mediastinal injury or pulmonary parenchymal injury. 4. No evidence of solid organ injury or bowel perforation. 5. Thoracolumbar scoliosis and degenerative changes. 6. Calcified uterine fibroids. 7. Aortic atherosclerosis. Electronically Signed   By: Burman NievesWilliam  Stevens M.D.   On: 02/06/2019 20:53    EKG: Independently reviewed.  Normal sinus rhythm with QRS of 108 ms and QTC of 497 ms.  Assessment/Plan Principal Problem:   Intracranial bleeding (HCC) Active Problems:   Syncope   Diabetes mellitus type 1 (HCC)   CKD (chronic kidney disease) stage 2, GFR 60-89 ml/min   Essential hypertension, benign   Anemia   Hypertensive urgency    1. Intracranial bleed with CAT scan showing a right frontal intraparenchymal hemorrhage and bilateral subdural and subarachnoid hemorrhage -appreciate neurosurgery consult.  MRI brain with and without contrast and MRA brain has been ordered. 2. Hypertensive urgency -discussed with neurosurgery at this time they have requested to keep systolic blood pressure less than 150.  For now I have placed orders for  PRN IV labetalol.  I have consulted pulmonary critical care to see if patient needs to be on continuous antihypertensive infusion. 3. Diabetes mellitus type 1 on Tresiba and sliding scale coverage.  Closely follow CBGs. 4. Hyperlipidemia on statins. 5. Acute encephalopathy likely from intracranial bleed.  Closely observe. 6. Normocytic normochromic anemia appears to be chronic closely follow CBC.   DVT prophylaxis: SCDs. Code Status: Full code. Family Communication: Discussed with patient's daughter. Disposition Plan: To be determined. Consults called: Neurosurgery and pulmonary critical care. Admission status: Observation.   Eduard ClosArshad N Konstantinos Cordoba MD [facility] Pager [phone].  If 7PM-7AM, please contact night-coverage [email or web address] Password [email or web address]  02/07/2019, 12:36 AM

## 2019-02-07 NOTE — Progress Notes (Signed)
NSGY contacted that pt has blown L pupil and is vomiting. Costella PA aware. Blood bank also called, platelets being prepared now.

## 2019-02-07 NOTE — Progress Notes (Signed)
Admitted to PCCM due to need for strict BP control. TRH will sign off. Please call as needed.

## 2019-02-07 NOTE — Progress Notes (Signed)
Inpatient Diabetes Program Recommendations  AACE/ADA: New Consensus Statement on Inpatient Glycemic Control (2015)  Target Ranges:  Prepandial:   less than 140 mg/dL      Peak postprandial:   less than 180 mg/dL (1-2 hours)      Critically ill patients:  140 - 180 mg/dL   Lab Results  Component Value Date   GLUCAP 299 (H) 02/07/2019   HGBA1C 7.9 (H) 01/04/2017    Review of Glycemic Control Results for Danielle Clarke, Danielle Clarke (MRN [id number]) as of 02/07/2019 10:06  Ref. Range 02/06/2019 19:24 02/06/2019 23:14 02/07/2019 03:20 02/07/2019 08:27  Glucose-Capillary Latest Ref Range: 70 - 99 mg/dL 621 (H) 308 (H) 657 (H) 299 (H)   Diabetes history: DM1 Outpatient Diabetes medications: Tresiba 10 units + Humalog 2-13 units qid Current orders for Inpatient glycemic control: Novolog sensitive correction q 4 hrs.  Inpatient Diabetes Program Recommendations:   Patient is type 1 and will need basal insulin -Lantus 8 units daily (80% of home basal dose) Spoke with RN Verneda Skill regarding recommendations.  Thank you, Billy Fischer. Aariona Momon, RN, MSN, CDE  Diabetes Coordinator Inpatient Glycemic Control Team Team Pager [phone] (8am-5pm) 02/07/2019 10:15 AM

## 2019-02-07 NOTE — Progress Notes (Signed)
RN called blood bank to get platelets ordered for patient. RN informed that all platelets were being used by OR and they were currently out and they will notify me when they have more in and ready. RN notified CCM at bedside. RN will continue to monitor patient. Patient vitals currently stable.

## 2019-02-07 NOTE — Progress Notes (Signed)
PT Cancellation Note  Patient Details Name: Danielle Clarke MRN: [id number] DOB: 02-Apr-1941   Cancelled Treatment:    Reason Eval/Treat Not Completed: Active bedrest order.  Pt is on bedrest.  PT to check back tomorrow.   Thanks,  Rollene Rotunda. Larene Ascencio, PT, DPT  Acute Rehabilitation [phone] pager [phone]) [phone] office     Lurena Joiner B Reyanne Hussar 02/07/2019, 11:47 AM

## 2019-02-07 NOTE — Progress Notes (Signed)
Neurosurgery and CCM contacted to notify that family has arrived to bedside.

## 2019-02-08 ENCOUNTER — Inpatient Hospital Stay (HOSPITAL_COMMUNITY): Payer: Medicare Other

## 2019-02-08 DIAGNOSIS — R55 Syncope and collapse: Secondary | ICD-10-CM

## 2019-02-08 LAB — COMPREHENSIVE METABOLIC PANEL
ALT: 21 U/L (ref 0–44)
AST: 27 U/L (ref 15–41)
Albumin: 3.5 g/dL (ref 3.5–5.0)
Alkaline Phosphatase: 62 U/L (ref 38–126)
Anion gap: 9 (ref 5–15)
BUN: 26 mg/dL — ABNORMAL HIGH (ref 8–23)
CO2: 25 mmol/L (ref 22–32)
Calcium: 8.5 mg/dL — ABNORMAL LOW (ref 8.9–10.3)
Chloride: 103 mmol/L (ref 98–111)
Creatinine, Ser: 0.73 mg/dL (ref 0.44–1.00)
GFR calc Af Amer: >60 mL/min (ref >60)
GFR calc non Af Amer: >60 mL/min (ref >60)
Glucose, Bld: 174 mg/dL — ABNORMAL HIGH (ref 70–99)
Potassium: 4 mmol/L (ref 3.5–5.1)
Sodium: 137 mmol/L (ref 135–145)
Total Bilirubin: 0.7 mg/dL (ref 0.3–1.2)
Total Protein: 6.2 g/dL — ABNORMAL LOW (ref 6.5–8.1)

## 2019-02-08 LAB — GLUCOSE, CAPILLARY
Glucose-Capillary: 163 mg/dL — ABNORMAL HIGH (ref 70–99)
Glucose-Capillary: 204 mg/dL — ABNORMAL HIGH (ref 70–99)
Glucose-Capillary: 176 mg/dL — ABNORMAL HIGH (ref 70–99)
Glucose-Capillary: 225 mg/dL — ABNORMAL HIGH (ref 70–99)
Glucose-Capillary: 143 mg/dL — ABNORMAL HIGH (ref 70–99)

## 2019-02-08 LAB — PREPARE PLATELET PHERESIS: Unit division: 0

## 2019-02-08 LAB — BPAM PLATELET PHERESIS
Blood Product Expiration Date: 202003152359
ISSUE DATE / TIME: 202003130932
Unit Type and Rh: 6200

## 2019-02-08 MED ORDER — ORAL CARE MOUTH RINSE
15.0000 mL | Freq: Two times a day (BID) | OROMUCOSAL | Status: DC
  Administered 2019-02-09 – 2019-02-14 (×5): 15 mL via OROMUCOSAL

## 2019-02-08 MED ORDER — INSULIN GLARGINE 100 UNIT/ML ~~LOC~~ SOLN
12.0000 [IU] | Freq: Every day | SUBCUTANEOUS | Status: DC
  Administered 2019-02-09: 12 [IU] via SUBCUTANEOUS
  Filled 2019-02-08: qty 0.12

## 2019-02-08 MED ORDER — SODIUM CHLORIDE 0.9 % IV SOLN
INTRAVENOUS | Status: DC
  Administered 2019-02-08 – 2019-02-10 (×3): via INTRAVENOUS

## 2019-02-08 MED ORDER — CHLORHEXIDINE GLUCONATE 0.12 % MT SOLN
15.0000 mL | Freq: Two times a day (BID) | OROMUCOSAL | Status: DC
  Administered 2019-02-08 – 2019-02-12 (×8): 15 mL via OROMUCOSAL
  Filled 2019-02-08 (×5): qty 15

## 2019-02-08 NOTE — Progress Notes (Signed)
Patient ID: Danielle Clarke, female   DOB: Dec 20, 1940, 78 y.o.   MRN: [id number] Peers stable.  Spoke with daughter at the bedside.  She is awake.  She follows commands.  She moves all extremities.  Continue current management

## 2019-02-08 NOTE — Evaluation (Signed)
Physical Therapy Evaluation Patient Details Name: Danielle Clarke MRN: [id number] DOB: 1941/10/22 Today's Date: 02/08/2019   History of Present Illness  78 yo with syncope at dentist office with Left frontal Intraparenchymal hematoma with bil R>L temporal contusions. PMHx: HTN, DM, glaucoma, HLD, RT TSA  Clinical Impression  Pt in bed upon arrival. Pt presents with aphasia, decreased strength, cognition, balance and activity tolerance limiting her functional mobility and safe return home. Pt able to follow one step commands inconsistently with visual/verbal cuing combo and increased time but is nonverbal secondary to aphasia. Pt presents with perseveration with sticking out her tongue once therapist moved on to asking the pt to touch her nose. Pt would benefit from skilled acute therapy to improve deficits and independence. Pt would benefit from CIR level rehab after discharge from hospital in order to maximize function and independence and decrease caregiver burden.     Follow Up Recommendations CIR;Supervision/Assistance - 24 hour    Equipment Recommendations  Rolling walker with 5" wheels    Recommendations for Other Services Rehab consult     Precautions / Restrictions Precautions Precautions: Fall Restrictions Weight Bearing Restrictions: No      Mobility  Bed Mobility Overal bed mobility: Needs Assistance Bed Mobility: Supine to Sit     Supine to sit: Min assist     General bed mobility comments: multimodal cuing to initiate and complete  Transfers Overall transfer level: Needs assistance Equipment used: 2 person hand held assist Transfers: Sit to/from Stand Sit to Stand: Min assist;+2 safety/equipment         General transfer comment: multimodal cuing for initiation and completion   Ambulation/Gait Ambulation/Gait assistance: Min assist;+2 safety/equipment Gait Distance (Feet): 150 Feet Assistive device: Rolling walker (2 wheeled) Gait Pattern/deviations:  Step-through pattern;Decreased stride length;Trunk flexed Gait velocity: decreased   General Gait Details: +2 for lines/leads and chair follow, pt with one period of running into wall with RW requiring cuing for correction  Stairs            Wheelchair Mobility    Modified Rankin (Stroke Patients Only) Modified Rankin (Stroke Patients Only) Pre-Morbid Rankin Score: No symptoms Modified Rankin: Moderately severe disability     Balance Overall balance assessment: Needs assistance Sitting-balance support: Bilateral upper extremity supported;Feet supported Sitting balance-Leahy Scale: Poor     Standing balance support: Bilateral upper extremity supported Standing balance-Leahy Scale: Poor Standing balance comment: pt requires bil UE support                             Pertinent Vitals/Pain Pain Assessment: No/denies pain    Home Living Family/patient expects to be discharged to:: Private residence Living Arrangements: Alone Available Help at Discharge: Family;Available PRN/intermittently Type of Home: House Home Access: Stairs to enter Entrance Stairs-Rails: Right(daughter reports can easily get handrails and/or ramp built) Entrance Stairs-Number of Steps: 2 Home Layout: One level Home Equipment: Walker - 4 wheels;Bedside commode;Cane - quad;Shower seat Additional Comments: prior level of function and home set up gathered from daughter, daughter is flight attendant with flexible schedule, is working to try and take leave of absence for next month, son and daughter in law local in [place] and able to help as well    Prior Function Level of Independence: Independent         Comments: Performs all ADLs and IADLs including driving. Uses a cane as she wants     Hand Dominance   Dominant Hand: Right  Extremity/Trunk Assessment   Upper Extremity Assessment Upper Extremity Assessment: Defer to OT evaluation    Lower Extremity Assessment Lower  Extremity Assessment: Generalized weakness    Cervical / Trunk Assessment Cervical / Trunk Assessment: Kyphotic  Communication   Communication: Expressive difficulties;Receptive difficulties  Cognition Arousal/Alertness: Lethargic Behavior During Therapy: Flat affect Overall Cognitive Status: Impaired/Different from baseline Area of Impairment: Orientation;Problem solving;Attention;Memory;Following commands;Safety/judgement;Awareness                 Orientation Level: Disoriented to Current Attention Level: Focused Memory: Decreased recall of precautions;Decreased short-term memory Following Commands: Follows one step commands with increased time;Follows one step commands inconsistently Safety/Judgement: Decreased awareness of safety;Decreased awareness of deficits Awareness: Intellectual Problem Solving: Slow processing;Decreased initiation;Difficulty sequencing;Requires verbal cues;Requires tactile cues        General Comments General comments (skin integrity, edema, etc.): SpO2 >90% on 2L. 141/59 BP. HR 90s    Exercises     Assessment/Plan    PT Assessment Patient needs continued PT services  PT Problem List Decreased strength;Decreased coordination;Decreased range of motion;Decreased cognition;Decreased activity tolerance;Decreased knowledge of use of DME;Decreased safety awareness;Decreased balance;Decreased mobility;Decreased knowledge of precautions       PT Treatment Interventions DME instruction;Therapeutic exercise;Gait training;Balance training;Stair training;Neuromuscular re-education;Functional mobility training;Cognitive remediation;Therapeutic activities;Patient/family education    PT Goals (Current goals can be found in the Care Plan section)  Acute Rehab PT Goals PT Goal Formulation: Patient unable to participate in goal setting    Frequency Min 4X/week   Barriers to discharge Decreased caregiver support      Co-evaluation PT/OT/SLP  Co-Evaluation/Treatment: Yes Reason for Co-Treatment: Complexity of the patient's impairments (multi-system involvement);Necessary to address cognition/behavior during functional activity;For patient/therapist safety PT goals addressed during session: Mobility/safety with mobility;Balance;Proper use of DME         AM-PAC PT "6 Clicks" Mobility  Outcome Measure Help needed turning from your back to your side while in a flat bed without using bedrails?: A Little Help needed moving from lying on your back to sitting on the side of a flat bed without using bedrails?: A Little Help needed moving to and from a bed to a chair (including a wheelchair)?: A Little Help needed standing up from a chair using your arms (e.g., wheelchair or bedside chair)?: A Little Help needed to walk in hospital room?: A Little Help needed climbing 3-5 steps with a railing? : A Lot 6 Click Score: 17    End of Session Equipment Utilized During Treatment: Gait belt Activity Tolerance: Patient tolerated treatment well Patient left: in chair;with call bell/phone within reach;with chair alarm set;with family/visitor present Nurse Communication: Mobility status PT Visit Diagnosis: Unsteadiness on feet (R26.81);Other symptoms and signs involving the nervous system (R29.898)    Time: 6808-8110 PT Time Calculation (min) (ACUTE ONLY): 28 min   Charges:   PT Evaluation $PT Eval Moderate Complexity: 1 Mod          Deondrick Searls, [place] [phone]   Luva Metzger 02/08/2019, 12:40 PM

## 2019-02-08 NOTE — Progress Notes (Signed)
Patient had a 16sec pause of asystole. Patient was arousable with movement in all extremities. No neuro changes. All other vital signs were stable. Elink notified. Awaiting MD orders. RN to continue to monitor.

## 2019-02-08 NOTE — Progress Notes (Signed)
NAME:  Danielle Clarke, MRN:  [id number], DOB:  04-20-1941, LOS: 1 ADMISSION DATE:  02/06/2019, CONSULTATION DATE:  02/07/2019 REFERRING MD:  Toniann Fail, CHIEF COMPLAINT:  Intracranial hemorrhage  HPI  78 year old with syncopal episode after dental visit with closed head trauma.  No operative intervention planned at this time given mild symptoms of dysarthria.  Admitted to PCCM for strict BP control.   Past Medical History  Aortic stenosis, arthritis, asthmatic bronchitis, carotid artery disease bilateral ICA hypertension, hyperlipidemia, diabetes, syncope    diagnostics   CT head 3/13 and MRI brain 3/12: left frontal IPH with SAH and minimal/DAI.  (personally reviewed) MRI brain 3/13: Intraparenchymal hematoma centered at the anterior left frontal lobe again seen, measuring 3.9 x 4.1 x 5.1 cm (estimated volume 40 cc, previously 10 cc). Internal fluid fluid level. Localized edema and regional mass effect mildly increased, with partial effacement of the frontal horn of the adjacent left lateral ventricle. Mild 4 mm left-to-right midline shift. Additional small hemorrhagic contusions at the temporal poles bilaterally, measuring 17 mm on the right (series 26, image 21) and 14 mm on the left (series 26, image 19). Localized edema without significant regional mass effect. Moderate volume subarachnoid hemorrhage involving the left greater than right cerebral hemispheres with extension into the sylvian fissures. Small amount of subarachnoid seen within the interpeduncular cistern. Small volume intraventricular hemorrhage now seen, likely related to redistribution. No hydrocephalus or ventricular trapping. Hospital course  3/12: Admitted following syncopal episode resulting in traumatic bleed 3/13: vomiting.  Felt more likely gastroparesis.  Imaging showing left frontal lobe intraparenchymal hematoma, with localized cerebral edema and mass-effect with mild left to right shift, having episodes of  bradycardia and pauses.  Seen by neurosurgery; Recommending blood pressure control 3/14: Episodes of bradycardic pauses.  Nausea vomiting better.   Interim history/subjective:  Episodes of cardiac pause  Objective   Blood pressure (Abnormal) 154/64, pulse 86, temperature (Abnormal) 97.4 F (36.3 C), temperature source Axillary, resp. rate (Abnormal) 25, height 5' (1.524 m), weight 49.6 kg, SpO2 97 %.        Intake/Output Summary (Last 24 hours) at 02/08/2019 1028 Last data filed at 02/08/2019 0600 Gross per 24 hour  Intake 627.05 ml  Output 1100 ml  Net -472.95 ml   Filed Weights   02/06/19 1747 02/07/19 0353  Weight: 49.4 kg 49.6 kg   Examination: General: 78 year old chronically ill-appearing female patient she is intermittently agitated but no acute distress HEENT normocephalic atraumatic pupils equal reactive does have audible carotid bruit Cardiac: Diffuse systolic murmur consistent with aortic stenosis Pulmonary: Clear to auscultation no accessory use Abdomen: Soft nontender Extremities: Warm and dry brisk cap refill no edema strong pulses scattered areas of ecchymosis GU: Voids  Assessment & Plan:   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 with associated cerebral edema and left to right midline shift as well as small volume subarachnoid bleeding -No role for surgery Plan Continuing supportive care SBP goal 110-140 Serial neuro checks Supportive care Rehab assessments including PT OT and speech  Recurrent syncope with episodes of complete heart block following vagal stimulation ? Nicardipine?  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  Noted on labs from  3/13 admission Plan Repeat  blood chemistry   Mild leukocytosis Plan Trend fever curve Repeat chest x-ray Trend white blood cell count  Anemia without evidence of bleeding Plan Trend CBC   Best practice:  Diet: NPO - hold on feeding tube for now given vomiting. Pain/Anxiety/Delirium protocol (if indicated): none required  VAP protocol (if indicated): n/a DVT prophylaxis: SCD GI prophylaxis: PPI Urinary catheter: Assessment of intravascular volume Glucose control: Basal with correction scale while NPO, will add meal coverage once being fed. Mobility: Bedrest with restrains for safety. High fall risk Code Status: Full  Family Communication: Daughter updated at bedside. Disposition: Remains critically ill following a traumatic intracerebral bleed.  We will continue to titrate antihypertensives.  Awaiting PT OT and SLP services evaluation.  We will keep her in the ICU for close observation and supportive care.  Hopefully she will pass her swallowing evaluation as I do not think she will do well with nasogastric tube.  We will continue the proton pump inhibitor infusion over the weekend, changed to twice daily on Monday  Critical care time: 32 minutes    Simonne Martinet ACNP-BC [facility] Pulmonary/Critical Care Pager # [phone] OR # [phone] if no answer  02/08/2019, 10:28 AM

## 2019-02-08 NOTE — Evaluation (Signed)
Occupational Therapy Evaluation Patient Details Name: Danielle Clarke MRN: [id number] DOB: 1941-03-19 Today's Date: 02/08/2019    History of Present Illness 78 yo with syncope at dentist office with Left frontal Intraparenchymal hematoma with bil R>L temporal contusions. PMHx: HTN, DM, glaucoma, HLD, RT TSA   Clinical Impression   PTA, pt was living alone and was performing ADLs/IADLs including driving. Pt currently requiring Mod-Max A for ADLs and Min A +2 with RW for functional mobility. Pt presenting with decreased cognition as seen by poor attention, awareness, problem solving, and following commands; pt following visual cues with increased success such as sticking out her tongue. Pt also presenting with decreased strength, balance, and functional mobility. Pt motivated to participate in therapy despite fatigue and lethargy. Pt will require further acute OT to facilitate safe dc. Recommend dc to CIR for further OT to optimize safety, independence with ADLs, and return to PLOF.      Follow Up Recommendations  CIR;Supervision/Assistance - 24 hour    Equipment Recommendations  Other (comment)(Defer to next venue)    Recommendations for Other Services Rehab consult;PT consult;Speech consult     Precautions / Restrictions Precautions Precautions: Fall Restrictions Weight Bearing Restrictions: No      Mobility Bed Mobility Overal bed mobility: Needs Assistance Bed Mobility: Supine to Sit     Supine to sit: Min assist     General bed mobility comments: multimodal cuing to initiate and complete  Transfers Overall transfer level: Needs assistance Equipment used: 2 person hand held assist Transfers: Sit to/from Stand Sit to Stand: Min assist;+2 safety/equipment         General transfer comment: multimodal cuing for initiation and completion     Balance Overall balance assessment: Needs assistance Sitting-balance support: Bilateral upper extremity supported;Feet  supported Sitting balance-Leahy Scale: Poor     Standing balance support: Bilateral upper extremity supported Standing balance-Leahy Scale: Poor Standing balance comment: pt requires bil UE support                           ADL either performed or assessed with clinical judgement   ADL Overall ADL's : Needs assistance/impaired Eating/Feeding: NPO   Grooming: Wash/dry face;Maximal assistance;Sitting Grooming Details (indicate cue type and reason): Max A due to poor following of cues Upper Body Bathing: Moderate assistance;Sitting   Lower Body Bathing: Maximal assistance;Sit to/from stand   Upper Body Dressing : Moderate assistance;Sitting   Lower Body Dressing: Maximal assistance;Sit to/from stand Lower Body Dressing Details (indicate cue type and reason): Max A for donning socks due to poor cognition and balance Toilet Transfer: Minimal assistance;+2 for physical assistance;+2 for safety/equipment;Ambulation;RW(Simulated to recliner)           Functional mobility during ADLs: Supervision/safety General ADL Comments: Pt presenting with poor cognition, balance, strength, and functional performance     Vision Baseline Vision/History: Wears glasses Wears Glasses: At all times;Distance only Patient Visual Report: No change from baseline       Perception     Praxis      Pertinent Vitals/Pain Pain Assessment: Faces Faces Pain Scale: Hurts even more Pain Location: Generalized Pain Intervention(s): Monitored during session;Limited activity within patient's tolerance;Repositioned     Hand Dominance Right   Extremity/Trunk Assessment Upper Extremity Assessment Upper Extremity Assessment: Generalized weakness   Lower Extremity Assessment Lower Extremity Assessment: Defer to PT evaluation   Cervical / Trunk Assessment Cervical / Trunk Assessment: Kyphotic   Communication Communication Communication: Expressive difficulties;Receptive difficulties  Cognition Arousal/Alertness: Lethargic Behavior During Therapy: Flat affect Overall Cognitive Status: Impaired/Different from baseline Area of Impairment: Orientation;Problem solving;Attention;Memory;Following commands;Safety/judgement;Awareness                 Orientation Level: Disoriented to Current Attention Level: Focused Memory: Decreased recall of precautions;Decreased short-term memory Following Commands: Follows one step commands with increased time;Follows one step commands inconsistently Safety/Judgement: Decreased awareness of safety;Decreased awareness of deficits Awareness: Intellectual Problem Solving: Slow processing;Decreased initiation;Difficulty sequencing;Requires verbal cues;Requires tactile cues General Comments: Pt requiring increased time and cues. Not verablizing throughotu session. very fatigued. Pt following visual cues better than verbal cues. Motivated to participate despite fatigue   General Comments  SpO2 >90% on 2L. 141/59 BP. HR 90s. Daughter arriving at end of session    Exercises     Shoulder Instructions      Home Living Family/patient expects to be discharged to:: Private residence Living Arrangements: Alone Available Help at Discharge: Family;Available PRN/intermittently Type of Home: House Home Access: Stairs to enter [employer] of Steps: 2 Entrance Stairs-Rails: Right(daughter reports can easily get handrails and/or ramp built) Home Layout: One level     Bathroom Shower/Tub: Chief Strategy Officer: Handicapped height     Home Equipment: Environmental consultant - 4 wheels;Bedside commode;Cane - quad;Shower seat   Additional Comments: daughter is flight attendant with flexible schedule, is working to try and take leave of absence for next month, son and daughter in law local in [place] and able to help as well  Lives With: Alone    Prior Functioning/Environment Level of Independence: Independent        Comments:  Performs all ADLs and IADLs including driving. Uses a cane as she wants        OT Problem List: Decreased strength;Decreased range of motion;Decreased activity tolerance;Impaired balance (sitting and/or standing);Impaired vision/perception;Decreased coordination;Decreased cognition;Decreased safety awareness;Decreased knowledge of use of DME or AE;Decreased knowledge of precautions;Pain      OT Treatment/Interventions: Self-care/ADL training;Therapeutic exercise;Energy conservation;DME and/or AE instruction;Therapeutic activities;Patient/family education    OT Goals(Current goals can be found in the care plan section) Acute Rehab OT Goals Patient Stated Goal: Unstated; Daughter reports wanting to go to rehab and get stronger OT Goal Formulation: With patient/family Time For Goal Achievement: 02/22/19 Potential to Achieve Goals: Good  OT Frequency: Min 3X/week   Barriers to D/C:            Co-evaluation PT/OT/SLP Co-Evaluation/Treatment: Yes Reason for Co-Treatment: Complexity of the patient's impairments (multi-system involvement);For patient/therapist safety;To address functional/ADL transfers   OT goals addressed during session: ADL's and self-care      AM-PAC OT "6 Clicks" Daily Activity     Outcome Measure Help from another person eating meals?: Total Help from another person taking care of personal grooming?: A Lot Help from another person toileting, which includes using toliet, bedpan, or urinal?: A Lot Help from another person bathing (including washing, rinsing, drying)?: A Lot Help from another person to put on and taking off regular upper body clothing?: A Lot Help from another person to put on and taking off regular lower body clothing?: A Lot 6 Click Score: 11   End of Session Equipment Utilized During Treatment: Gait belt;Rolling walker;Oxygen Nurse Communication: Mobility status  Activity Tolerance: Patient tolerated treatment well;Patient limited by  fatigue Patient left: in chair;with call bell/phone within reach;with chair alarm set;with restraints reapplied;with family/visitor present  OT Visit Diagnosis: Unsteadiness on feet (R26.81);Other abnormalities of gait and mobility (R26.89);Muscle weakness (generalized) (M62.81);Pain Pain - part  of body: (Generalized)                Time: 4268-3419 OT Time Calculation (min): 29 min Charges:  OT General Charges $OT Visit: 1 Visit OT Evaluation $OT Eval Moderate Complexity: 1 Mod  Adnan Vanvoorhis MSOT, OTR/L Acute Rehab Pager: [phone] Office: [phone]  Theodoro Grist Camdin Hegner 02/08/2019, 5:00 PM

## 2019-02-08 NOTE — Progress Notes (Signed)
Subjective: No further nausea/vomiting. No further hematemesis or blood in stool.  Objective: Vital signs in last 24 hours: Temp:  [97.4 F (36.3 C)-100.2 F (37.9 C)] 98.7 F (37.1 C) (03/14 1200) Pulse Rate:  [86-104] 86 (03/14 0900) Resp:  [14-25] 25 (03/14 0800) BP: (118-166)/(43-80) 154/64 (03/14 0900) SpO2:  [86 %-100 %] 97 % (03/14 0800) Weight change:  Last BM Date: (UTA)  PE: GEN:  Awake, alert, answers basic questions  Lab Results: CBC    Component Value Date/Time   WBC 17.0 (H) 02/07/2019 1143   RBC 3.64 (L) 02/07/2019 1143   HGB 11.0 (L) 02/07/2019 1143   HCT 32.7 (L) 02/07/2019 1143   PLT 267 02/07/2019 1143   MCV 89.8 02/07/2019 1143   MCH 30.2 02/07/2019 1143   MCHC 33.6 02/07/2019 1143   RDW 13.7 02/07/2019 1143   LYMPHSABS 1.1 02/06/2019 1957   MONOABS 0.8 02/06/2019 1957   EOSABS 0.1 02/06/2019 1957   BASOSABS 0.1 02/06/2019 1957   CMP     Component Value Date/Time   NA 137 02/08/2019 1144   K 4.0 02/08/2019 1144   CL 103 02/08/2019 1144   CO2 25 02/08/2019 1144   GLUCOSE 174 (H) 02/08/2019 1144   BUN 26 (H) 02/08/2019 1144   CREATININE 0.73 02/08/2019 1144   CALCIUM 8.5 (L) 02/08/2019 1144   PROT 6.2 (L) 02/08/2019 1144   ALBUMIN 3.5 02/08/2019 1144   AST 27 02/08/2019 1144   ALT 21 02/08/2019 1144   ALKPHOS 62 02/08/2019 1144   BILITOT 0.7 02/08/2019 1144   GFRNONAA >60 02/08/2019 1144   GFRAA >60 02/08/2019 1144    Assessment:  1.  Coffee ground emesis, resolved. 2.  Nausea, vomiting, resolved. 3.  Recent intracranial bleed.  Plan:  1.  Empiric antiemetics and PPI. 2.  No plan for endoscopy in absence of life-threatening GI bleeding. 3.  [facility] will sign-off; please call with questions; thank you for the consult.   Danielle Clarke 02/08/2019, 2:35 PM   Cell [phone] If no answer or after 5 PM call [phone]

## 2019-02-08 NOTE — Progress Notes (Signed)
OT Cancellation Note  Patient Details Name: MACLE CRAM MRN: [id number] DOB: 1941-09-07   Cancelled Treatment:    Reason Eval/Treat Not Completed: Active bedrest order  Oprah Camarena M Britanie Harshman Rainen Vanrossum MSOT, OTR/L Acute Rehab Pager: [phone] Office: [phone] 02/08/2019, 7:08 AM

## 2019-02-08 NOTE — Progress Notes (Signed)
Rehab Admissions Coordinator Note:  Per PT recommendation, this patient was screened by Nanine Means for appropriateness for an Inpatient Acute Rehab Consult.  At this time, we are recommending Inpatient Rehab consult.  Nanine Means 02/08/2019, 12:49 PM  I can be reached at [phone].

## 2019-02-08 NOTE — Progress Notes (Signed)
Patient O2 sats at 86%. Nasal cannula removed by patient. RN placed nasal cannula back on patient. Patient continued to remove nasal cannula numerous times. RN to continue to monitor.

## 2019-02-08 NOTE — Progress Notes (Signed)
VASCULAR LAB PRELIMINARY  PRELIMINARY  PRELIMINARY  PRELIMINARY  Carotid duplex completed.    Preliminary report:  See CV Proc for results  Sherren Kerns, RVT 02/08/2019, 1:03 PM

## 2019-02-08 NOTE — Progress Notes (Signed)
PT Cancellation Note  Patient Details Name: Danielle Clarke MRN: [id number] DOB: 25-Nov-1941   Cancelled Treatment:    Reason Eval/Treat Not Completed: Active bedrest order   [name] [place] Sica 02/08/2019, 6:56 AM Delaney Meigs, PT Acute Rehabilitation Services Pager: [phone] Office: [phone]

## 2019-02-08 NOTE — Progress Notes (Signed)
Progress Note  Patient Name: Danielle Clarke Date of Encounter: 02/08/2019  Primary Cardiologist  [facility]  Primary Electrophysiologist: SK    Patient Profile     78 y.o. female admitted with recurrent syncope in the past ascribed to swallowing and coughing.  Note describes always associated with dysphagia although her daughter is not quite sure of the certainty of this association.  Admitting event occurred leaving the dentist.  She was standing talking at the desk and then abruptly lost consciousness and within seconds was awake.  Admitted to hospital where CT scan showed a left frontal intraparenchymal hematoma with a subarachnoid hemorrhage as well.  Nifedipine started for elevated blood pressure.  Last 24 hours that showed multiple episodes of intermittent high-grade heart block associated with PP prolongation and loss of plethysmography consistent with vasodilatation  Subjective   Denies chest pain or shortness of breath  Inpatient Medications    Scheduled Meds:   stroke: mapping our early stages of recovery book   Does not apply Once   insulin aspart  0-9 Units Subcutaneous Q4H   [START ON 02/09/2019] insulin glargine  12 Units Subcutaneous Daily   [START ON 02/10/2019] pantoprazole  40 mg Intravenous Q12H   Continuous Infusions:  sodium chloride     niCARDipine Stopped (02/07/19 1827)   pantoprozole (PROTONIX) infusion 8 mg/hr (02/08/19 0600)   PRN Meds: metoCLOPramide (REGLAN) injection, [DISCONTINUED] ondansetron **OR** ondansetron (ZOFRAN) IV   Vital Signs    Vitals:   02/08/19 0715 02/08/19 0800 02/08/19 0900 02/08/19 1200  BP: (!) 145/64 (!) 157/52 (!) 154/64   Pulse: 98 96 86   Resp: 19 (!) 25    Temp:  (!) 97.4 F (36.3 C)  98.7 F (37.1 C)  TempSrc:  Axillary  Oral  SpO2: 95% 97%    Weight:      Height:        Intake/Output Summary (Last 24 hours) at 02/08/2019 1422 Last data filed at 02/08/2019 0600 Gross per 24 hour  Intake 627.05 ml    Output 1100 ml  Net -472.95 ml   Filed Weights   02/06/19 1747 02/07/19 0353  Weight: 49.4 kg 49.6 kg    Telemetry    Sinus rhythm/tachycardia with intermittent high-grade heart block with pauses of up to 4 seconds associated with PP prolongation and loss of plethysmography- Personally Reviewed  ECG      - Personally Reviewed  Physical Exam   GEN: No acute distress but tachypneic O2 sat 90 Neck: JVD flat Cardiac: RRR, 2/6 murmurs, rubs, or gallops.  Respiratory: Clear to auscultation bilaterally. GI: Soft, nontender, non-distended  MS:   edema; No deformity. Neuro:  Nonfocal  Psych: Flat affect Skin Warm and dry badly bruised  Labs    Chemistry Recent Labs  Lab 02/06/19 1957 02/06/19 2004 02/07/19 0439 02/08/19 1144  NA 135  --  137 137  K 4.1  --  4.3 4.0  CL 98  --  93* 103  CO2 29  --  26 25  GLUCOSE 173*  --  290* 174*  BUN 20  --  19 26*  CREATININE 0.84 0.80 0.90 0.73  CALCIUM 8.8*  --  9.5 8.5*  PROT 5.9*  --  6.7 6.2*  ALBUMIN 3.6  --  3.7 3.5  AST 39  --  32 27  ALT 24  --  25 21  ALKPHOS 70  --  68 62  BILITOT 0.5  --  0.6 0.7  GFRNONAA >60  --  >  60 >60  GFRAA >60  --  >60 >60  ANIONGAP 8  --  18* 9     Hematology Recent Labs  Lab 02/06/19 1957 02/07/19 0439 02/07/19 1143  WBC 11.4* 12.9* 17.0*  RBC 3.90 3.91 3.64*  HGB 11.4* 11.5* 11.0*  HCT 35.9* 35.2* 32.7*  MCV 92.1 90.0 89.8  MCH 29.2 29.4 30.2  MCHC 31.8 32.7 33.6  RDW 13.4 13.4 13.7  PLT 273 268 267    Cardiac Enzymes Recent Labs  Lab 02/07/19 0439 02/07/19 0849 02/07/19 1403  TROPONINI <0.03 <0.03 <0.03   No results for input(s): TROPIPOC in the last 168 hours.   BNPNo results for input(s): BNP, PROBNP in the last 168 hours.   DDimer No results for input(s): DDIMER in the last 168 hours.   Radiology    Ct Head Wo Contrast  Result Date: 02/07/2019 CLINICAL DATA:  Intracranial hemorrhage with new vomiting and change in pupil EXAM: CT HEAD WITHOUT CONTRAST  TECHNIQUE: Contiguous axial images were obtained from the base of the skull through the vertex without intravenous contrast. COMPARISON:  Brain MRI from yesterday FINDINGS: Brain: Mixed mainly high-density hematoma in the left frontal lobe unchanged from MRI at up to 4 cm diameter. Increase in regional edema with local mass effect. Scattered subarachnoid hemorrhage. Subdural hematoma along the left cerebral convexity measuring up to 6 mm in thickness, not increased. Hemorrhagic contusion in the right temporal pole with mild local subarachnoid hemorrhage. No appreciable mass effect in this area. Midline shift measures up to 4 mm at the anterior septum pellucidum. Vascular: Negative Skull: Right parietal scalp hematoma. Sinuses/Orbits: Bilateral cataract resection. IMPRESSION: No increase in multifocal intracranial hemorrhage, but there is increased swelling around the 4 cm left frontal hematoma with midline shift measuring up to 4 mm. No herniation or entrapment. Electronically Signed   By: Marnee Spring M.D.   On: 02/07/2019 05:22   Ct Head Wo Contrast  Result Date: 02/06/2019 CLINICAL DATA:  Fall EXAM: CT HEAD WITHOUT CONTRAST CT CERVICAL SPINE WITHOUT CONTRAST TECHNIQUE: Multidetector CT imaging of the head and cervical spine was performed following the standard protocol without intravenous contrast. Multiplanar CT image reconstructions of the cervical spine were also generated. COMPARISON:  Head CT 01/04/2017 FINDINGS: CT HEAD FINDINGS Brain: There is a left frontal intraparenchymal hematoma measuring 2.2 x 2.2 x 3.9 cm (volume = 9.9 cm^3) with moderate surrounding edema. There is mixed subdural and subarachnoid blood over the left convexity, extending into the sylvian fissure. Smaller amount of subarachnoid blood the right sylvian fissure. No midline shift or other significant mass effect. No hydrocephalus. Vascular: No abnormal hyperdensity of the major intracranial arteries or dural venous sinuses. No  intracranial atherosclerosis. Skull: Large right parietal posterior scalp hematoma. Sinuses/Orbits: No fluid levels or advanced mucosal thickening of the visualized paranasal sinuses. No mastoid or middle ear effusion. The orbits are normal. CT CERVICAL SPINE FINDINGS Alignment: No static subluxation. Facets are aligned. Occipital condyles are normally positioned. Skull base and vertebrae: There is an old type 2 fracture of the dens with chronic nonunion. There is there is C3-C6 anterior fusion with solid anterior fusion mass. No acute fracture. Soft tissues and spinal canal: No prevertebral fluid or swelling. No visible canal hematoma. Disc levels: No bony spinal canal stenosis. Upper chest: No pneumothorax, pulmonary nodule or pleural effusion. Other: Normal visualized paraspinal cervical soft tissues. IMPRESSION: 1. Left frontal intraparenchymal hematoma with volume approximately 10 mL and mild surrounding edema with small volume subarachnoid/subdural blood over  both convexities, left-greater-than-right. 2. Posterior right parietal scalp hematoma, consistent with a contrecoup pattern of injury. 3. No acute fracture or static subluxation of the cervical spine. 4. 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 Critical Value/emergent results were called by telephone at the time of interpretation on 02/06/2019 at 8:41 pm to Dr. Mancel Bale , who verbally acknowledged these results. Electronically Signed   By: Deatra Robinson M.D.   On: 02/06/2019 20:47   Ct Chest W Contrast  Result Date: 02/06/2019 CLINICAL DATA:  Blunt abdominal trauma. EXAM: CT CHEST, ABDOMEN, AND PELVIS WITH CONTRAST TECHNIQUE: Multidetector CT imaging of the chest, abdomen and pelvis was performed following the standard protocol during bolus administration of intravenous contrast. CONTRAST:  OMNIPAQUE IOHEXOL 300 MG/ML  SOLN COMPARISON:  CT chest 08/06/2017. FINDINGS: CT CHEST FINDINGS Cardiovascular: Normal heart size. No  pericardial effusions. Coronary artery and mitral valve annulus calcifications. Normal caliber thoracic aorta with calcifications. No aortic dissection. Central pulmonary arteries are well opacified without evidence of significant pulmonary embolus. Mediastinum/Nodes: Esophagus is decompressed. No significant lymphadenopathy in the chest. No abnormal mediastinal gas or fluid collections. Lungs/Pleura: Atelectasis in the lung bases. No focal consolidation. No pleural effusions. No pneumothorax. Airways are patent. Musculoskeletal: Postoperative changes with lower cervical anterior fixation and right shoulder arthroplasty. Thoracolumbar scoliosis convex towards the right in the thoracic region and towards the left in the lumbar region. Diffuse degenerative changes throughout the thoracic spine. No vertebral compression deformities. Sternum and ribs appear intact. No acute displaced fractures identified. CT ABDOMEN PELVIS FINDINGS Hepatobiliary: No hepatic injury or perihepatic hematoma. Gallbladder is unremarkable Pancreas: Unremarkable. No pancreatic ductal dilatation or surrounding inflammatory changes. Spleen: No splenic injury or perisplenic hematoma. Adrenals/Urinary Tract: No adrenal hemorrhage or renal injury identified. Bladder is unremarkable. Stomach/Bowel: Stomach, small bowel, and colon are not abnormally distended. Stool fills the colon. No wall thickening or inflammatory changes appreciated. No mesenteric hematoma or gas collections. Appendix is not identified. Vascular/Lymphatic: Aortic atherosclerosis. No enlarged abdominal or pelvic lymph nodes. Reproductive: Uterus and ovaries are not enlarged. Calcifications in the uterus likely representing calcified fibroids. Other: No free air or free fluid in the abdomen. Abdominal wall musculature appears intact. Musculoskeletal: Thoracolumbar scoliosis. No vertebral compression deformities. Degenerative changes throughout the lumbar spine with narrowed  interspaces and endplate hypertrophic changes. Sacrum, pelvis, and hips appear intact. No acute fractures are identified. IMPRESSION: 1. No acute posttraumatic changes demonstrated in the chest, abdomen, or pelvis. 2. Atelectasis in the lung bases. 3. No evidence of mediastinal injury or pulmonary parenchymal injury. 4. No evidence of solid organ injury or bowel perforation. 5. Thoracolumbar scoliosis and degenerative changes. 6. Calcified uterine fibroids. 7. Aortic atherosclerosis. Electronically Signed   By: Burman Nieves M.D.   On: 02/06/2019 20:53   Ct Cervical Spine Wo Contrast  Result Date: 02/06/2019 CLINICAL DATA:  Fall EXAM: CT HEAD WITHOUT CONTRAST CT CERVICAL SPINE WITHOUT CONTRAST TECHNIQUE: Multidetector CT imaging of the head and cervical spine was performed following the standard protocol without intravenous contrast. Multiplanar CT image reconstructions of the cervical spine were also generated. COMPARISON:  Head CT 01/04/2017 FINDINGS: CT HEAD FINDINGS Brain: There is a left frontal intraparenchymal hematoma measuring 2.2 x 2.2 x 3.9 cm (volume = 9.9 cm^3) with moderate surrounding edema. There is mixed subdural and subarachnoid blood over the left convexity, extending into the sylvian fissure. Smaller amount of subarachnoid blood the right sylvian fissure. No midline shift or other significant mass effect. No hydrocephalus. Vascular: No abnormal  hyperdensity of the major intracranial arteries or dural venous sinuses. No intracranial atherosclerosis. Skull: Large right parietal posterior scalp hematoma. Sinuses/Orbits: No fluid levels or advanced mucosal thickening of the visualized paranasal sinuses. No mastoid or middle ear effusion. The orbits are normal. CT CERVICAL SPINE FINDINGS Alignment: No static subluxation. Facets are aligned. Occipital condyles are normally positioned. Skull base and vertebrae: There is an old type 2 fracture of the dens with chronic nonunion. There is there is  C3-C6 anterior fusion with solid anterior fusion mass. No acute fracture. Soft tissues and spinal canal: No prevertebral fluid or swelling. No visible canal hematoma. Disc levels: No bony spinal canal stenosis. Upper chest: No pneumothorax, pulmonary nodule or pleural effusion. Other: Normal visualized paraspinal cervical soft tissues. IMPRESSION: 1. Left frontal intraparenchymal hematoma with volume approximately 10 mL and mild surrounding edema with small volume subarachnoid/subdural blood over both convexities, left-greater-than-right. 2. Posterior right parietal scalp hematoma, consistent with a contrecoup pattern of injury. 3. No acute fracture or static subluxation of the cervical spine. 4. 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 Critical Value/emergent results were called by telephone at the time of interpretation on 02/06/2019 at 8:41 pm to Dr. Mancel Bale , who verbally acknowledged these results. Electronically Signed   By: Deatra Robinson M.D.   On: 02/06/2019 20:47   Mr Maxine Glenn Head Wo Contrast  Result Date: 02/07/2019 CLINICAL DATA:  Initial evaluation for acute syncope, fall, traumatic intracranial hemorrhage. EXAM: MRI HEAD WITHOUT AND WITH CONTRAST MRA HEAD WITHOUT CONTRAST TECHNIQUE: Multiplanar, multiecho pulse sequences of the brain and surrounding structures were obtained without and with intravenous contrast. Angiographic images of the head were obtained using MRA technique without contrast. CONTRAST:  5 cc of Gadavist. COMPARISON:  Prior CT from earlier the same day. FINDINGS: MRI HEAD FINDINGS Brain: Examination mildly degraded by motion artifact. Intraparenchymal hematoma centered at the anterior left frontal lobe again seen, measuring 3.9 x 4.1 x 5.1 cm (estimated volume 40 cc, previously 10 cc). Internal fluid fluid level. Localized edema and regional mass effect mildly increased, with partial effacement of the frontal horn of the adjacent left lateral ventricle. Mild 4  mm left-to-right midline shift. Additional small hemorrhagic contusions at the temporal poles bilaterally, measuring 17 mm on the right (series 26, image 21) and 14 mm on the left (series 26, image 19). Localized edema without significant regional mass effect. Moderate volume subarachnoid hemorrhage involving the left greater than right cerebral hemispheres with extension into the sylvian fissures. Small amount of subarachnoid seen within the interpeduncular cistern. Small volume intraventricular hemorrhage now seen, likely related to redistribution. No hydrocephalus or ventricular trapping. Left holo hemispheric subdural hematoma measures up to 6 mm in maximal thickness at the level of the left parietal convexity. Smaller right convexity subdural hemorrhage measures up to 3 mm in maximal thickness. Mild extension along the falx noted. Underlying age-related cerebral atrophy. Patchy T2/FLAIR hyperintensity within the bilateral thalami, likely related to chronic microvascular ischemic disease, mild for age. No evidence for acute or subacute infarct. Gray-white matter differentiation otherwise maintained. No areas of chronic infarction. No mass lesion. Minimal patchy enhancement underlying the left frontal hematoma felt to be reactive in nature. No underlying discrete mass identified. No other abnormal enhancement within the brain. Pituitary gland suprasellar region normal. Midline structures intact. Vascular: Major intracranial vascular flow voids maintained. Skull and upper cervical spine: Craniocervical junction within normal limits. Postsurgical changes noted within the upper cervical spine. Bone marrow signal intensity within normal limits.  Left parietal scalp contusion with superimposed 13 mm soft tissue hematoma noted. Patient status post bilateral ocular lens replacement. Paranasal sinuses are largely clear. Right concha bullosa with associated right-to-left nasal septal deviation noted. No mastoid effusion.  Inner ear structures grossly normal. Sinuses/Orbits: None. Other: None. MRA HEAD FINDINGS ANTERIOR CIRCULATION: Examination mildly degraded by motion artifact. Distal cervical segments of the internal carotid arteries are widely patent with symmetric antegrade flow. Petrous, cavernous, and supraclinoid segments patent with scattered atheromatous irregularity. Probable short-segment moderate stenoses at the anterior cavernous right ICA and supraclinoid left ICA. Apparent 3 mm outpouching arising at the region of the right ophthalmic artery, which could reflect a small aneurysm or vascular infundibulum (series 7, image 93). Finding not entirely certain on this motion degraded exam. ICA termini well perfused. A1 segments widely patent bilaterally. Normal anterior communicating artery. Anterior cerebral arteries patent to their distal aspects without stenosis. M1 segments patent bilaterally. Normal MCA bifurcations. Distal MCA branches well perfused and symmetric. No vascular abnormality seen underlying the left frontal hematoma. POSTERIOR CIRCULATION: Vertebral arteries patent to the vertebrobasilar junction without flow-limiting stenosis. Left PICA patent. Right PICA not seen. Probable short fenestration at the proximal basilar artery noted. Basilar patent to its distal aspect without stenosis. Superior cerebral arteries patent bilaterally. PCA supplied via the basilar as well as small bilateral posterior communicating arteries. PCAs patent to their distal aspects without appreciable stenosis. IMPRESSION: MRI HEAD IMPRESSION: 1. Interval expansion of left frontal intraparenchymal hematoma, now measuring up to 40 cc in volume (previously 10 cc). Localized edema and regional mass effect mildly increased. No underlying mass lesion or other abnormality. 2. Additional small hemorrhagic contusions at the anterior temporal poles bilaterally as above. 3. Moderate volume traumatic subarachnoid hemorrhage involving the left  greater than right cerebral hemispheres with small left greater than right subdural hematomas. Associated mild 4 mm left-to-right midline shift. No hydrocephalus or ventricular trapping. 4. Evolving right parietal scalp contusion. 5. Otherwise negative brain MRI. No other findings to explain intracranial hemorrhage identified. MRA HEAD IMPRESSION: 1. Technically limited exam due to motion artifact. 2. No vascular abnormality underlying the left frontal intraparenchymal hematoma identified. 3. 3 mm focal outpouching arising from the cavernous right ICA, which could reflect a small aneurysm versus vascular infundibulum, difficult to be certain given motion artifact on this exam. Either way, finding felt to most likely be incidental in nature, and unrelated to the acute intracranial hemorrhage. 4. Atherosclerotic change within the carotid siphons with associated moderate multifocal stenoses as above. Electronically Signed   By: Rise Mu M.D.   On: 02/07/2019 01:09   Mr Laqueta Jean CV Contrast  Result Date: 02/07/2019 CLINICAL DATA:  Initial evaluation for acute syncope, fall, traumatic intracranial hemorrhage. EXAM: MRI HEAD WITHOUT AND WITH CONTRAST MRA HEAD WITHOUT CONTRAST TECHNIQUE: Multiplanar, multiecho pulse sequences of the brain and surrounding structures were obtained without and with intravenous contrast. Angiographic images of the head were obtained using MRA technique without contrast. CONTRAST:  5 cc of Gadavist. COMPARISON:  Prior CT from earlier the same day. FINDINGS: MRI HEAD FINDINGS Brain: Examination mildly degraded by motion artifact. Intraparenchymal hematoma centered at the anterior left frontal lobe again seen, measuring 3.9 x 4.1 x 5.1 cm (estimated volume 40 cc, previously 10 cc). Internal fluid fluid level. Localized edema and regional mass effect mildly increased, with partial effacement of the frontal horn of the adjacent left lateral ventricle. Mild 4 mm left-to-right midline  shift. Additional small hemorrhagic contusions at the temporal poles bilaterally,  measuring 17 mm on the right (series 26, image 21) and 14 mm on the left (series 26, image 19). Localized edema without significant regional mass effect. Moderate volume subarachnoid hemorrhage involving the left greater than right cerebral hemispheres with extension into the sylvian fissures. Small amount of subarachnoid seen within the interpeduncular cistern. Small volume intraventricular hemorrhage now seen, likely related to redistribution. No hydrocephalus or ventricular trapping. Left holo hemispheric subdural hematoma measures up to 6 mm in maximal thickness at the level of the left parietal convexity. Smaller right convexity subdural hemorrhage measures up to 3 mm in maximal thickness. Mild extension along the falx noted. Underlying age-related cerebral atrophy. Patchy T2/FLAIR hyperintensity within the bilateral thalami, likely related to chronic microvascular ischemic disease, mild for age. No evidence for acute or subacute infarct. Gray-white matter differentiation otherwise maintained. No areas of chronic infarction. No mass lesion. Minimal patchy enhancement underlying the left frontal hematoma felt to be reactive in nature. No underlying discrete mass identified. No other abnormal enhancement within the brain. Pituitary gland suprasellar region normal. Midline structures intact. Vascular: Major intracranial vascular flow voids maintained. Skull and upper cervical spine: Craniocervical junction within normal limits. Postsurgical changes noted within the upper cervical spine. Bone marrow signal intensity within normal limits. Left parietal scalp contusion with superimposed 13 mm soft tissue hematoma noted. Patient status post bilateral ocular lens replacement. Paranasal sinuses are largely clear. Right concha bullosa with associated right-to-left nasal septal deviation noted. No mastoid effusion. Inner ear structures  grossly normal. Sinuses/Orbits: None. Other: None. MRA HEAD FINDINGS ANTERIOR CIRCULATION: Examination mildly degraded by motion artifact. Distal cervical segments of the internal carotid arteries are widely patent with symmetric antegrade flow. Petrous, cavernous, and supraclinoid segments patent with scattered atheromatous irregularity. Probable short-segment moderate stenoses at the anterior cavernous right ICA and supraclinoid left ICA. Apparent 3 mm outpouching arising at the region of the right ophthalmic artery, which could reflect a small aneurysm or vascular infundibulum (series 7, image 93). Finding not entirely certain on this motion degraded exam. ICA termini well perfused. A1 segments widely patent bilaterally. Normal anterior communicating artery. Anterior cerebral arteries patent to their distal aspects without stenosis. M1 segments patent bilaterally. Normal MCA bifurcations. Distal MCA branches well perfused and symmetric. No vascular abnormality seen underlying the left frontal hematoma. POSTERIOR CIRCULATION: Vertebral arteries patent to the vertebrobasilar junction without flow-limiting stenosis. Left PICA patent. Right PICA not seen. Probable short fenestration at the proximal basilar artery noted. Basilar patent to its distal aspect without stenosis. Superior cerebral arteries patent bilaterally. PCA supplied via the basilar as well as small bilateral posterior communicating arteries. PCAs patent to their distal aspects without appreciable stenosis. IMPRESSION: MRI HEAD IMPRESSION: 1. Interval expansion of left frontal intraparenchymal hematoma, now measuring up to 40 cc in volume (previously 10 cc). Localized edema and regional mass effect mildly increased. No underlying mass lesion or other abnormality. 2. Additional small hemorrhagic contusions at the anterior temporal poles bilaterally as above. 3. Moderate volume traumatic subarachnoid hemorrhage involving the left greater than right  cerebral hemispheres with small left greater than right subdural hematomas. Associated mild 4 mm left-to-right midline shift. No hydrocephalus or ventricular trapping. 4. Evolving right parietal scalp contusion. 5. Otherwise negative brain MRI. No other findings to explain intracranial hemorrhage identified. MRA HEAD IMPRESSION: 1. Technically limited exam due to motion artifact. 2. No vascular abnormality underlying the left frontal intraparenchymal hematoma identified. 3. 3 mm focal outpouching arising from the cavernous right ICA, which could reflect a small  aneurysm versus vascular infundibulum, difficult to be certain given motion artifact on this exam. Either way, finding felt to most likely be incidental in nature, and unrelated to the acute intracranial hemorrhage. 4. Atherosclerotic change within the carotid siphons with associated moderate multifocal stenoses as above. Electronically Signed   By: Rise Mu M.D.   On: 02/07/2019 01:09   Ct Abdomen Pelvis W Contrast  Result Date: 02/06/2019 CLINICAL DATA:  Blunt abdominal trauma. EXAM: CT CHEST, ABDOMEN, AND PELVIS WITH CONTRAST TECHNIQUE: Multidetector CT imaging of the chest, abdomen and pelvis was performed following the standard protocol during bolus administration of intravenous contrast. CONTRAST:  OMNIPAQUE IOHEXOL 300 MG/ML  SOLN COMPARISON:  CT chest 08/06/2017. FINDINGS: CT CHEST FINDINGS Cardiovascular: Normal heart size. No pericardial effusions. Coronary artery and mitral valve annulus calcifications. Normal caliber thoracic aorta with calcifications. No aortic dissection. Central pulmonary arteries are well opacified without evidence of significant pulmonary embolus. Mediastinum/Nodes: Esophagus is decompressed. No significant lymphadenopathy in the chest. No abnormal mediastinal gas or fluid collections. Lungs/Pleura: Atelectasis in the lung bases. No focal consolidation. No pleural effusions. No pneumothorax. Airways are  patent. Musculoskeletal: Postoperative changes with lower cervical anterior fixation and right shoulder arthroplasty. Thoracolumbar scoliosis convex towards the right in the thoracic region and towards the left in the lumbar region. Diffuse degenerative changes throughout the thoracic spine. No vertebral compression deformities. Sternum and ribs appear intact. No acute displaced fractures identified. CT ABDOMEN PELVIS FINDINGS Hepatobiliary: No hepatic injury or perihepatic hematoma. Gallbladder is unremarkable Pancreas: Unremarkable. No pancreatic ductal dilatation or surrounding inflammatory changes. Spleen: No splenic injury or perisplenic hematoma. Adrenals/Urinary Tract: No adrenal hemorrhage or renal injury identified. Bladder is unremarkable. Stomach/Bowel: Stomach, small bowel, and colon are not abnormally distended. Stool fills the colon. No wall thickening or inflammatory changes appreciated. No mesenteric hematoma or gas collections. Appendix is not identified. Vascular/Lymphatic: Aortic atherosclerosis. No enlarged abdominal or pelvic lymph nodes. Reproductive: Uterus and ovaries are not enlarged. Calcifications in the uterus likely representing calcified fibroids. Other: No free air or free fluid in the abdomen. Abdominal wall musculature appears intact. Musculoskeletal: Thoracolumbar scoliosis. No vertebral compression deformities. Degenerative changes throughout the lumbar spine with narrowed interspaces and endplate hypertrophic changes. Sacrum, pelvis, and hips appear intact. No acute fractures are identified. IMPRESSION: 1. No acute posttraumatic changes demonstrated in the chest, abdomen, or pelvis. 2. Atelectasis in the lung bases. 3. No evidence of mediastinal injury or pulmonary parenchymal injury. 4. No evidence of solid organ injury or bowel perforation. 5. Thoracolumbar scoliosis and degenerative changes. 6. Calcified uterine fibroids. 7. Aortic atherosclerosis. Electronically Signed   By:  Burman Nieves M.D.   On: 02/06/2019 20:53   Dg Chest Port 1 View  Result Date: 02/08/2019 CLINICAL DATA:  Hypoxia.  History of diabetes. EXAM: PORTABLE CHEST 1 VIEW COMPARISON:  February 07, 2019 FINDINGS: Transcutaneous pacer leads overlie the left chest. No pneumothorax. The cardiomediastinal silhouette is normal. No pulmonary nodules or masses. No focal infiltrates. Probable atelectasis in the bases. No overt edema. IMPRESSION: No active disease. Electronically Signed   By: Gerome Sam III M.D   On: 02/08/2019 11:29   Dg Chest Port 1 View  Result Date: 02/07/2019 CLINICAL DATA:  Vomiting. Concern for aspiration. EXAM: PORTABLE CHEST 1 VIEW COMPARISON:  08/06/2017 FINDINGS: Normal heart size and pulmonary vascularity. Calcification in the mitral valve annulus. Lungs are clear. No blunting of costophrenic angles. No pneumothorax. Mediastinal contours appear intact. Calcification of the aorta. Postoperative changes in the cervical spine  and right shoulder. Lumbar scoliosis convex towards the left. IMPRESSION: No evidence of active pulmonary disease. Aortic atherosclerosis. Electronically Signed   By: Burman Nieves M.D.   On: 02/07/2019 03:35   Vas US Carotid  Result Date: 02/08/2019 Carotid Arterial Duplex Study Indications:       Syncope. Limitations:       constant movement Comparison Study:  Prior study from 01/05/17 is available for comparison. Performing Technologist: Sherren Kerns RVS  Examination Guidelines: A complete evaluation includes B-mode imaging, spectral Doppler, color Doppler, and power Doppler as needed of all accessible portions of each vessel. Bilateral testing is considered an integral part of a complete examination. Limited examinations for reoccurring indications may be performed as noted.  Right Carotid Findings: +----------+--------+--------+--------+------------+------------------+             PSV cm/s EDV cm/s Stenosis Describe     Comments             +----------+--------+--------+--------+------------+------------------+  CCA Prox   107      14                             intimal thickening  +----------+--------+--------+--------+------------+------------------+  CCA Distal 95       16                             intimal thickening  +----------+--------+--------+--------+------------+------------------+  ICA Prox   91       20                heterogenous                     +----------+--------+--------+--------+------------+------------------+  ICA Distal 113      24                                                 +----------+--------+--------+--------+------------+------------------+  ECA        173      9                 calcific                         +----------+--------+--------+--------+------------+------------------+ +----------+--------+-------+--------+-------------------+             PSV cm/s EDV cms Describe Arm Pressure (mmHG)  +----------+--------+-------+--------+-------------------+  Subclavian 151                                            +----------+--------+-------+--------+-------------------+ +---------+--------+--------+----------------------------------+  Vertebral PSV cm/s EDV cm/s Not assessed and constant movement  +---------+--------+--------+----------------------------------+  Left Carotid Findings: +----------+--------+--------+--------+-----------+------------------+             PSV cm/s EDV cm/s Stenosis Describe    Comments            +----------+--------+--------+--------+-----------+------------------+  CCA Prox   96       15                            intimal thickening  +----------+--------+--------+--------+-----------+------------------+  CCA Distal 84       17  intimal thickening  +----------+--------+--------+--------+-----------+------------------+  ICA Prox   78       19                homogeneous                     +----------+--------+--------+--------+-----------+------------------+  ICA  Distal 90       20                                                +----------+--------+--------+--------+-----------+------------------+  ECA        296      9                 calcific                        +----------+--------+--------+--------+-----------+------------------+ +----------+--------+--------+--------+-------------------+  Subclavian PSV cm/s EDV cm/s Describe Arm Pressure (mmHG)  +----------+--------+--------+--------+-------------------+             138                                             +----------+--------+--------+--------+-------------------+ +---------+--------+---+--------+--+  Vertebral PSV cm/s 135 EDV cm/s 22  +---------+--------+---+--------+--+  Summary: Right Carotid: Velocities in the right ICA are consistent with a 1-39% stenosis. Left Carotid: Velocities in the left ICA are consistent with a 1-39% stenosis. Vertebrals:  Left vertebral artery demonstrates antegrade flow. Subclavians: Normal flow hemodynamics were seen in bilateral subclavian              arteries. *See table(s) above for measurements and observations.     Preliminary     Cardiac Studies   Echocardiogram 3/20 demonstrates normal LV function aortic stenosis with a mean gradient of 24 mild LVH.  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  Would use a CLS device as plethysmography tracings during her events suggested concomitant vasodilatation  Have reviewed with family and aware that this is a potential partial  but unlikely complete solution.  Will review with family again tomorrow.  Signed, Sherryl Manges, MD  02/08/2019, 2:22 PM

## 2019-02-09 LAB — COMPREHENSIVE METABOLIC PANEL
ALT: 21 U/L (ref 0–44)
AST: 25 U/L (ref 15–41)
Albumin: 3.2 g/dL — ABNORMAL LOW (ref 3.5–5.0)
Alkaline Phosphatase: 53 U/L (ref 38–126)
Anion gap: 13 (ref 5–15)
BUN: 29 mg/dL — ABNORMAL HIGH (ref 8–23)
CO2: 24 mmol/L (ref 22–32)
Calcium: 8.4 mg/dL — ABNORMAL LOW (ref 8.9–10.3)
Chloride: 107 mmol/L (ref 98–111)
Creatinine, Ser: 0.82 mg/dL (ref 0.44–1.00)
GFR calc Af Amer: >60 mL/min (ref >60)
GFR calc non Af Amer: >60 mL/min (ref >60)
Glucose, Bld: 195 mg/dL — ABNORMAL HIGH (ref 70–99)
Potassium: 3.7 mmol/L (ref 3.5–5.1)
Sodium: 144 mmol/L (ref 135–145)
Total Bilirubin: 0.8 mg/dL (ref 0.3–1.2)
Total Protein: 6.3 g/dL — ABNORMAL LOW (ref 6.5–8.1)

## 2019-02-09 LAB — GLUCOSE, CAPILLARY
Glucose-Capillary: 115 mg/dL — ABNORMAL HIGH (ref 70–99)
Glucose-Capillary: 119 mg/dL — ABNORMAL HIGH (ref 70–99)
Glucose-Capillary: 156 mg/dL — ABNORMAL HIGH (ref 70–99)
Glucose-Capillary: 161 mg/dL — ABNORMAL HIGH (ref 70–99)
Glucose-Capillary: 190 mg/dL — ABNORMAL HIGH (ref 70–99)
Glucose-Capillary: 202 mg/dL — ABNORMAL HIGH (ref 70–99)
Glucose-Capillary: 82 mg/dL (ref 70–99)

## 2019-02-09 LAB — CBC
HCT: 33.8 % — ABNORMAL LOW (ref 36.0–46.0)
Hemoglobin: 10.7 g/dL — ABNORMAL LOW (ref 12.0–15.0)
MCH: 29.6 pg (ref 26.0–34.0)
MCHC: 31.7 g/dL (ref 30.0–36.0)
MCV: 93.6 fL (ref 80.0–100.0)
Platelets: 276 10*3/uL (ref 150–400)
RBC: 3.61 MIL/uL — ABNORMAL LOW (ref 3.87–5.11)
RDW: 14.2 % (ref 11.5–15.5)
WBC: 20.2 10*3/uL — ABNORMAL HIGH (ref 4.0–10.5)
nRBC: 0 % (ref 0.0–0.2)

## 2019-02-09 MED ORDER — FUROSEMIDE 10 MG/ML IJ SOLN
40.0000 mg | Freq: Two times a day (BID) | INTRAMUSCULAR | Status: AC
  Administered 2019-02-09 (×2): 40 mg via INTRAVENOUS
  Filled 2019-02-09 (×2): qty 4

## 2019-02-09 MED ORDER — CLINDAMYCIN PHOSPHATE 600 MG/50ML IV SOLN
600.0000 mg | INTRAVENOUS | Status: AC
  Administered 2019-02-10: 600 mg via INTRAVENOUS
  Filled 2019-02-09 (×2): qty 50

## 2019-02-09 MED ORDER — INSULIN GLARGINE 100 UNIT/ML ~~LOC~~ SOLN
14.0000 [IU] | Freq: Every day | SUBCUTANEOUS | Status: DC
  Administered 2019-02-11: 14 [IU] via SUBCUTANEOUS
  Filled 2019-02-09 (×2): qty 0.14

## 2019-02-09 MED ORDER — LEVALBUTEROL HCL 0.63 MG/3ML IN NEBU
0.6300 mg | INHALATION_SOLUTION | Freq: Three times a day (TID) | RESPIRATORY_TRACT | Status: AC
  Administered 2019-02-09 – 2019-02-10 (×3): 0.63 mg via RESPIRATORY_TRACT
  Filled 2019-02-09 (×3): qty 3

## 2019-02-09 MED ORDER — LEVALBUTEROL HCL 0.63 MG/3ML IN NEBU: 0.6300 mg | INHALATION_SOLUTION | Freq: Three times a day (TID) | RESPIRATORY_TRACT | Status: DC

## 2019-02-09 NOTE — Progress Notes (Signed)
Progress Note  Patient Name: Danielle Clarke Date of Encounter: 02/09/2019  Primary Cardiologist  [facility]  Primary Electrophysiologist: SK    Patient Profile     78 y.o. female admitted with recurrent syncope in the past ascribed to swallowing and coughing.  Note describes always associated with dysphagia although her daughter is not quite sure of the certainty of this association.  Admitting event occurred leaving the dentist.  She was standing talking at the desk and then abruptly lost consciousness and within seconds was awake.  Admitted to hospital where CT scan showed a left frontal intraparenchymal hematoma with a subarachnoid hemorrhage as well.  Nifedipine started for elevated blood pressure.  Last 24 hours that showed multiple episodes of intermittent high-grade heart block associated with PP prolongation and loss of plethysmography consistent with vasodilatation  Subjective   Sob and without chest pain   Inpatient Medications    Scheduled Meds:   stroke: mapping our early stages of recovery book   Does not apply Once   chlorhexidine  15 mL Mouth Rinse BID   insulin aspart  0-9 Units Subcutaneous Q4H   insulin glargine  12 Units Subcutaneous Daily   mouth rinse  15 mL Mouth Rinse q12n4p   [START ON 02/10/2019] pantoprazole  40 mg Intravenous Q12H   Continuous Infusions:  sodium chloride 50 mL/hr at 02/09/19 0630   clindamycin (CLEOCIN) IV     niCARDipine 2 mg/hr (02/09/19 0400)   pantoprozole (PROTONIX) infusion 8 mg/hr (02/09/19 0400)   PRN Meds: metoCLOPramide (REGLAN) injection, [DISCONTINUED] ondansetron **OR** ondansetron (ZOFRAN) IV   Vital Signs    Vitals:   02/09/19 0615 02/09/19 0700 02/09/19 0800 02/09/19 0816  BP: (!) 147/53 133/83  (!) 133/49  Pulse: (!) 52     Resp: (!) 36     Temp:   98.8 F (37.1 C)   TempSrc:   Oral   SpO2: 100%     Weight:      Height:        Intake/Output Summary (Last 24 hours) at 02/09/2019 0847 Last data  filed at 02/09/2019 0800 Gross per 24 hour  Intake 1793.1 ml  Output 725 ml  Net 1068.1 ml   Filed Weights   02/06/19 1747 02/07/19 0353 02/09/19 0500  Weight: 49.4 kg 49.6 kg 49.8 kg    Telemetry    Some atrial tach but no heart block  ECG      - Personally Reviewed  Physical Exam   Well developed and nourished in mod resp distress RR 24 HENT normal Neck supple with JVP-  Flat 8 Clear but with wheezing Rapid but regular rate and rhythm, no murmurs or gallops Abd-soft with active BS No Clubbing cyanosis no edema Skin-warm and dry A & Oriented  Grossly normal sensory and motor function   *   Labs    Chemistry Recent Labs  Lab 02/07/19 0439 02/08/19 1144 02/09/19 0624  NA 137 137 144  K 4.3 4.0 3.7  CL 93* 103 107  CO2 GLUCOSE 290* 174* 195*  BUN 19 26* 29*  CREATININE 0.90 0.73 0.82  CALCIUM 9.5 8.5* 8.4*  PROT 6.7 6.2* 6.3*  ALBUMIN 3.7 3.5 3.2*  AST 32 27 25  ALT ALKPHOS 68 62 53  BILITOT 0.6 0.7 0.8  GFRNONAA >60 >60 >60  GFRAA >60 >60 >60  ANIONGAP 18* 9 13     Hematology Recent Labs  Lab 02/07/19 0439 02/07/19 1143  02/09/19 0624  WBC 12.9* 17.0* 20.2*  RBC 3.91 3.64* 3.61*  HGB 11.5* 11.0* 10.7*  HCT 35.2* 32.7* 33.8*  MCV 90.0 89.8 93.6  MCH 29.4 30.2 29.6  MCHC 32.7 33.6 31.7  RDW 13.4 13.7 14.2  PLT 268 267 276    Cardiac Enzymes Recent Labs  Lab 02/07/19 0439 02/07/19 0849 02/07/19 1403  TROPONINI <0.03 <0.03 <0.03   No results for input(s): TROPIPOC in the last 168 hours.   BNPNo results for input(s): BNP, PROBNP in the last 168 hours.   DDimer No results for input(s): DDIMER in the last 168 hours.   Radiology    Dg Chest Port 1 View  Result Date: 02/08/2019 CLINICAL DATA:  Hypoxia.  History of diabetes. EXAM: PORTABLE CHEST 1 VIEW COMPARISON:  February 07, 2019 FINDINGS: Transcutaneous pacer leads overlie the left chest. No pneumothorax. The cardiomediastinal silhouette is normal. No pulmonary  nodules or masses. No focal infiltrates. Probable atelectasis in the bases. No overt edema. IMPRESSION: No active disease. Electronically Signed   By: Gerome Sam III M.D   On: 02/08/2019 11:29   Vas US Carotid  Result Date: 02/08/2019 Carotid Arterial Duplex Study Indications:       Syncope. Limitations:       constant movement Comparison Study:  Prior study from 01/05/17 is available for comparison. Performing Technologist: Sherren Kerns RVS  Examination Guidelines: A complete evaluation includes B-mode imaging, spectral Doppler, color Doppler, and power Doppler as needed of all accessible portions of each vessel. Bilateral testing is considered an integral part of a complete examination. Limited examinations for reoccurring indications may be performed as noted.  Right Carotid Findings: +----------+--------+--------+--------+------------+------------------+             PSV cm/s EDV cm/s Stenosis Describe     Comments            +----------+--------+--------+--------+------------+------------------+  CCA Prox   107      14                             intimal thickening  +----------+--------+--------+--------+------------+------------------+  CCA Distal 95       16                             intimal thickening  +----------+--------+--------+--------+------------+------------------+  ICA Prox   91       20                heterogenous                     +----------+--------+--------+--------+------------+------------------+  ICA Distal 113      24                                                 +----------+--------+--------+--------+------------+------------------+  ECA        173      9                 calcific                         +----------+--------+--------+--------+------------+------------------+ +----------+--------+-------+--------+-------------------+             PSV cm/s EDV cms Describe Arm Pressure (mmHG)  +----------+--------+-------+--------+-------------------+  Subclavian  151                                             +----------+--------+-------+--------+-------------------+ +---------+--------+--------+----------------------------------+  Vertebral PSV cm/s EDV cm/s Not assessed and constant movement  +---------+--------+--------+----------------------------------+  Left Carotid Findings: +----------+--------+--------+--------+-----------+------------------+             PSV cm/s EDV cm/s Stenosis Describe    Comments            +----------+--------+--------+--------+-----------+------------------+  CCA Prox   96       15                            intimal thickening  +----------+--------+--------+--------+-----------+------------------+  CCA Distal 84       17                            intimal thickening  +----------+--------+--------+--------+-----------+------------------+  ICA Prox   78       19                homogeneous                     +----------+--------+--------+--------+-----------+------------------+  ICA Distal 90       20                                                +----------+--------+--------+--------+-----------+------------------+  ECA        296      9                 calcific                        +----------+--------+--------+--------+-----------+------------------+ +----------+--------+--------+--------+-------------------+  Subclavian PSV cm/s EDV cm/s Describe Arm Pressure (mmHG)  +----------+--------+--------+--------+-------------------+             138                                             +----------+--------+--------+--------+-------------------+ +---------+--------+---+--------+--+  Vertebral PSV cm/s 135 EDV cm/s 22  +---------+--------+---+--------+--+  Summary: Right Carotid: Velocities in the right ICA are consistent with a 1-39% stenosis. Left Carotid: Velocities in the left ICA are consistent with a 1-39% stenosis. Vertebrals:  Left vertebral artery demonstrates antegrade flow. Subclavians: Normal flow hemodynamics were seen in bilateral subclavian               arteries. *See table(s) above for measurements and observations.     Preliminary     Cardiac Studies   Echocardiogram 3/20 demonstrates normal LV function aortic stenosis with a mean gradient of 24 mild LVH.  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  The patient understands agrees and is willing to proceed. Tommie Ard, MD  02/09/2019, 8:47 AM

## 2019-02-09 NOTE — Progress Notes (Signed)
  Speech Language Pathology Treatment: Cognitive-Linquistic  Patient Details Name: Danielle Clarke MRN: [id number] DOB: Nov 12, 1941 Today's Date: 02/09/2019 Time: 8453-6468 SLP Time Calculation (min) (ACUTE ONLY): 34 min  Assessment / Plan / Recommendation Clinical Impression  Pt seen for diagnostic treatment of language, cognitive, and motor speech deficits. [name] sustained attention improving; required occasional mod cues to attend to tasks. SLP administered Western Aphasia Battery (Bedside form). Bedside aphasia score is 30 (26-50 is severe). Pt did not initiate spontaneous speech with picture description, even with mod-max cues; she was focused on the task but could not initiate speech. Responses to biographical questions were accurate, with extended time. Overall Y/N responses 50% accurate (errors with complex or out-of-context questions). In context pt followed oral motor commands without visual cues 75% accuracy. Sequential commands 20% accuracy. Repetition impaired (10% accuracy). Confrontation naming 60% accuracy; there are perseverations (naming colors x3 after prompted "what color is this?"), paraphasias (hanger/finger, cockclock/watch), and neologisms (scofow/pillow, carclaheel/hair). Writing impaired; pt wrote a stream of letters in cursive without any recognizable words when cued to write her name. Wrote "1 2 3  10" for #'s 1-10. Intelligibility at word level improving, although phonation impaired. In general pt's auditory comprehension appears to be better than verbal expression. SLP will continue to follow with interventions to improve intelligibility, language and cognitive function.     HPI HPI: Danielle Clarke is a 78 y.o. female with history of diabetes mellitus type 1, asthmatic bronchitis, neck fx 2013, hyperlipidemia was brought to the ER after syncopal episode at the dentist office. Had some difficulty talking. CT head showed left frontal intraparenchymal hematoma and  subarachnoid and subdural hematoma in both convexities (small hemorrhagic contusions at the anterior temporal poles bilaterally). Pt had coffee-ground emesis, nause/vomiting which has resolved and GI has signed off. Reported history of syncope ascribed to swallowing and coughing. Failed [employer] Screen.      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

## 2019-02-09 NOTE — Evaluation (Signed)
Clinical/Bedside Swallow Evaluation Patient Details  Name: Danielle Clarke MRN: [id number] Date of Birth: [birth year]  Today's Date: 02/09/2019 Time: SLP Start Time (ACUTE ONLY): 2023 SLP Stop Time (ACUTE ONLY): 0830 SLP Time Calculation (min) (ACUTE ONLY): 11 min  Past Medical History:  Past Medical History:  Diagnosis Date  . Aortic stenosis, mild   . Arthritis   . Asthmatic bronchitis    with colds per patient  . Carotid artery disease (HCC)    40-59% bilateral ICA stenosis  . Cataract   . Cutaneous abscess of right foot   . Glaucoma   . Heart murmur    Dr Katrinka Blazing is her  cardiologist.   . Hyperlipemia   . Hypertension    Dr. Katrinka Blazing ~ 2 years ago  . Neck fracture [facility])    july 2013  . Osteoporosis   . Syncope   . Type 1 diabetes mellitus (HCC)    Past Surgical History:  Past Surgical History:  Procedure Laterality Date  . ANKLE FRACTURE SURGERY Left 2001   steel plate and 3 screws   . ANTERIOR CERVICAL CORPECTOMY N/A 07/31/2014   Procedure: Cervical Four to Cervical Six Corpectomy;  Surgeon: Karn Cassis, MD;  Location: [facility];  Service: Neurosurgery;  Laterality: N/A;  C4 to C6 Corpectomy  . BREAST SURGERY  1988  . CATARACT EXTRACTION W/ INTRAOCULAR LENS  IMPLANT, BILATERAL Bilateral 2011   right and then left  . EYE SURGERY    . FRACTURE SURGERY    . HAMMER TOE SURGERY  1998  . I&D EXTREMITY Right 09/27/2018   Procedure: IRRIGATION AND DEBRIDEMENT RIGHT FOOT;  Surgeon: Nadara Mustard, MD;  Location: [facility] OR;  Service: Orthopedics;  Laterality: Right;  . JOINT REPLACEMENT    . TONSILLECTOMY AND ADENOIDECTOMY  1948  . TOTAL SHOULDER REPLACEMENT  2010   right shoulder   . TUBAL LIGATION  1979  . TYMPANOSTOMY TUBE PLACEMENT Bilateral    HPI:  Danielle Clarke is a 78 y.o. female with history of diabetes mellitus type 1, asthmatic bronchitis, neck fx 2013, hyperlipidemia was brought to the ER after syncopal episode at the dentist office. Had some difficulty  talking. CT head showed left frontal intraparenchymal hematoma and subarachnoid and subdural hematoma in both convexities (small hemorrhagic contusions at the anterior temporal poles bilaterally). Pt had coffee-ground emesis, nause/vomiting which has resolved and GI has signed off. Reported history of syncope ascribed to swallowing and coughing. Failed [employer] Screen.   Assessment / Plan / Recommendation Clinical Impression   Patient presents with signs of oropharyngeal dysphagia. No focal oral weakness, however voice is aphonic and cough weak, concerning for CN X involvement. Per chart review pt was not intubated. Voice did not improve even oral care provided and pt consumed several ice chips; feel vocal quality less likely to be related to NPO status. Small ice chips were tolerated fairly well, and oral transit improved as trials progressed. With larger ice chips and teaspoons of thin liquid, pt has increased work of breathing, throat clearing. Weak cough present on 3/3 trials of water via teaspoon. Attempted 1/2 teaspoon puree to determine if possible to give oral medications. Swallow appeared effortful, multiple swallows and throat clearing suggestive of pharyngeal deficits. [name] ability to follow one-step commands improving. Pt with some dyspnea today; feel MBS may be more appropriate next date. Spoke with RN; would allow a few small ice chips after oral care to facilitate use of swallowing musculature, as long as  respiratory status remains stable. SLP to follow up next date for PO/instrumental readiness. Would consider alternative means of nutrition if unable to initiate POs at that time.   SLP Visit Diagnosis: Dysphagia, oropharyngeal phase (R13.12)    Aspiration Risk  Moderate aspiration risk;Severe aspiration risk    Diet Recommendation NPO(may have a few ice chips with RN after oral care)   Liquid Administration via: Spoon Medication Administration: Via alternative means Compensations:  Slow rate;Small sips/bites    Other  Recommendations Oral Care Recommendations: Oral care QID;Oral care prior to ice chip/H20   Follow up Recommendations Inpatient Rehab      Frequency and Duration min 2x/week  1 week       Prognosis Prognosis for Safe Diet Advancement: Good Barriers to Reach Goals: Language deficits;Cognitive deficits      Swallow Study   General Date of Onset: 02/06/19 HPI: Danielle Clarke is a 78 y.o. female with history of diabetes mellitus type 1, asthmatic bronchitis, neck fx 2013, hyperlipidemia was brought to the ER after syncopal episode at the dentist office. Had some difficulty talking. CT head showed left frontal intraparenchymal hematoma and subarachnoid and subdural hematoma in both convexities (small hemorrhagic contusions at the anterior temporal poles bilaterally). Pt had coffee-ground emesis, nause/vomiting which has resolved and GI has signed off. Reported history of syncope ascribed to swallowing and coughing. Failed [employer] Screen. Type of Study: Bedside Swallow Evaluation Previous Swallow Assessment: none in chart Diet Prior to this Study: NPO Temperature Spikes Noted: No Respiratory Status: Nasal cannula History of Recent Intubation: No Behavior/Cognition: Alert;Cooperative Oral Cavity Assessment: Dry Oral Care Completed by SLP: Yes Oral Cavity - Dentition: Adequate natural dentition Vision: Impaired for self-feeding Self-Feeding Abilities: Needs assist Patient Positioning: Upright in bed Baseline Vocal Quality: Other (comment)(aphonic) Volitional Cough: Weak Volitional Swallow: Able to elicit(grimmaces)    Oral/Motor/Sensory Function Overall Oral Motor/Sensory Function: (no focal weakness; aphonic, ?CNX involvement?)   Ice Chips Ice chips: Impaired Presentation: Spoon Oral Phase Functional Implications: Prolonged oral transit(improved as trials progressed) Pharyngeal Phase Impairments: Throat Clearing - Immediate;Multiple  swallows;Other (comments)(increased work of breathing)   Thin Liquid Thin Liquid: Impaired Presentation: Spoon Pharyngeal  Phase Impairments: Cough - Immediate;Other (comments)(increased work of breathing)    Nectar Thick Nectar Thick Liquid: Not tested   Honey Thick Honey Thick Liquid: Not tested   Puree Puree: Impaired Presentation: Spoon(1/2 teaspoon) Pharyngeal Phase Impairments: Multiple swallows;Throat Clearing - Immediate   Solid     Solid: Not tested     Rondel Baton, MS, CCC-SLP Speech-Language Pathologist Acute Rehabilitation Services Pager: [phone] Office: [phone]  Arlana Lindau 02/09/2019,9:17 AM

## 2019-02-09 NOTE — Progress Notes (Addendum)
NAME:  Danielle Clarke, MRN:  [id number], DOB:  [birth year], LOS: 2 ADMISSION DATE:  02/06/2019, CONSULTATION DATE:  02/07/2019 REFERRING MD:  Toniann Fail, CHIEF COMPLAINT:  Intracranial hemorrhage  HPI  78 year old with syncopal episode after dental visit with closed head trauma.  No operative intervention planned at this time given mild symptoms of dysarthria.  Admitted to PCCM for strict BP control.   Past Medical History  Aortic stenosis, arthritis, asthmatic bronchitis, carotid artery disease bilateral ICA hypertension, hyperlipidemia, diabetes, syncope    diagnostics   CT head 3/13 and MRI brain 3/12: left frontal IPH with SAH and minimal/DAI.  (personally reviewed) MRI brain 3/13: Intraparenchymal hematoma centered at the anterior left frontal lobe again seen, measuring 3.9 x 4.1 x 5.1 cm (estimated volume 40 cc, previously 10 cc). Internal fluid fluid level. Localized edema and regional mass effect mildly increased, with partial effacement of the frontal horn of the adjacent left lateral ventricle. Mild 4 mm left-to-right midline shift. Additional small hemorrhagic contusions at the temporal poles bilaterally, measuring 17 mm on the right (series 26, image 21) and 14 mm on the left (series 26, image 19). Localized edema without significant regional mass effect. Moderate volume subarachnoid hemorrhage involving the left greater than right cerebral hemispheres with extension into the sylvian fissures. Small amount of subarachnoid seen within the interpeduncular cistern. Small volume intraventricular hemorrhage now seen, likely related to redistribution. No hydrocephalus or ventricular trapping. Hospital course  3/12: Admitted following syncopal episode resulting in traumatic bleed 3/13: vomiting.  Felt more likely gastroparesis.  Imaging showing left frontal lobe intraparenchymal hematoma, with localized cerebral edema and mass-effect with mild left to right shift, having episodes of  bradycardia and pauses.  Seen by neurosurgery; Recommending blood pressure control 3/14: Episodes of bradycardic pauses.  Nausea vomiting better.  Seen by cardiology, they will be planning on pacemaker on 3/16 3/15: More awake, seen by SLP, deemed high aspiration risk, blood pressure improved, mental status improved; checking UC   Interim history/subjective:  Looks about the same.  Objective   Blood pressure (Abnormal) 133/49, pulse (Abnormal) 52, temperature 98.8 F (37.1 C), temperature source Oral, resp. rate (Abnormal) 36, height 5' (1.524 m), weight 49.8 kg, SpO2 100 %.        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 Abdomen: Soft nontender Extremities: Warm dry brisk cap refill Neuro: Awake, interactive, moves all extremities without focal deficits  Resolved issues  Anion gap metabolic acidosis Assessment & Plan:   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 with associated cerebral edema and left to right midline shift as well as small volume subarachnoid bleeding -No role for surgery Plan Continuing supportive care  Blood pressure goal 110-140 remains on nicardipine Serial neuro checks Need to start oral anti-hypertensives, unfortunately unable to take safely orally  Dysphasia Plan Core track MBS  Recurrent syncope with episodes of complete heart block following vagal stimulation -how much of this was also exacerbated by  Precedex, but has had episodes of syncope and vagal issues in the past Plan For pacemaker insertion by EP Continue telemetry  Episodes of hypoxia Portable chest x-ray: Negative for pulmonary edema or infiltrate Plan Pulse oximetry Aspiration precautions  Nausea and vomiting Likely diabetic gastroparesis this is improved Seen by GI Plan Continuing PPI infusion, change to every 12 hours once she is completed 72 hours Reglan as needed  Diabetes Plan Sliding scale insulin with basal coverage   Mild leukocytosis -CBC rising Plan Trend fever curve   Anemia without evidence of bleeding Plan Trend CBC   Best practice:  Diet: NPO - hold on feeding tube for now given vomiting. Pain/Anxiety/Delirium protocol (if indicated): none required  VAP protocol (if indicated): n/a DVT prophylaxis: SCD GI prophylaxis: PPI Urinary catheter: Assessment of intravascular volume Glucose control: Basal with correction scale while NPO, will add meal coverage once being fed. Mobility: Bedrest with restrains for safety. High fall risk Code Status: Full  Family Communication: Daughter updated at bedside. Disposition: She remains critically ill due to her need for ongoing titration of antihypertensives.  Unfortunately she failed her bedside swallow eval so will need alternative means of getting her nutrition.  Also of concern her white blood cell count continues to rise.  This could all be from blood.  And continue to titrate antihypertensives  Critical care time: 33 minutes   Simonne Martinet ACNP-BC [facility] Pulmonary/Critical Care Pager # [phone] OR # [phone] if no answer  02/09/2019, 9:55 AM

## 2019-02-09 NOTE — Progress Notes (Signed)
Physical Therapy Treatment Patient Details Name: Danielle Clarke MRN: [id number] DOB: 12-09-1940 Today's Date: 02/09/2019    History of Present Illness 78 yo with syncope at dentist office with Left frontal Intraparenchymal hematoma with bil R>L temporal contusions. PMHx: HTN, DM, glaucoma, HLD, RT TSA    PT Comments    Pt progressing well. Pt more verbal today but continues to demonstrate some word finding difficulties and expressive aphasia. Pt unable to state last name and attempted to spell it but was not accurate or close. Pt more steady with ambulation but cont to require assist navigating and demonstrates decreased safety awareness and possible L sided neglect. Pt with improved vision with glasses. Pt able to complete finger to nose test bilat, L slight off compared to R. Pt able to track L/R and up/down. Spoke extensively with daughter regarding CIR and plans after. She states she will be able to provide 24/7 assist if needed.    Follow Up Recommendations  CIR;Supervision/Assistance - 24 hour     Equipment Recommendations  Rolling walker with 5" wheels    Recommendations for Other Services Rehab consult     Precautions / Restrictions Precautions Precautions: Fall Precaution Comments: appears to have either L sided neglect or vision impairment, pt put on glasses and appeared to have improved visually Restrictions Weight Bearing Restrictions: No    Mobility  Bed Mobility Overal bed mobility: Needs Assistance Bed Mobility: Rolling;Sidelying to Sit Rolling: Min assist Sidelying to sit: Min assist       General bed mobility comments: multimodal cuing, once awake pt did start moving LEs off EOB, minA for trunk elevation  Transfers Overall transfer level: Needs assistance Equipment used: 1 person hand held assist Transfers: Sit to/from Stand Sit to Stand: Min assist         General transfer comment: verbal cues to push up from bed, no carry over, minA to power up  and place feet on the ground and to maintain stability during transition of hands from bed to walker  Ambulation/Gait Ambulation/Gait assistance: Min assist;+2 safety/equipment Gait Distance (Feet): 150 Feet Assistive device: Rolling walker (2 wheeled) Gait Pattern/deviations: Step-through pattern;Decreased stride length;Trunk flexed Gait velocity: decreased   General Gait Details: 2nd person for line management, pt on 5LO2 via [place], SPO2 >93%, mild SOB, pt with trunk flexion and mild SOB, 3 standing rest breaks, deferred ambulating further due to fatigue. Once patient with glasses pt able to navigate hallways with simple cues and minimal running into objects   Stairs             Wheelchair Mobility    Modified Rankin (Stroke Patients Only) Modified Rankin (Stroke Patients Only) Pre-Morbid Rankin Score: No symptoms Modified Rankin: Moderately severe disability     Balance Overall balance assessment: Needs assistance Sitting-balance support: Bilateral upper extremity supported;Feet supported Sitting balance-Leahy Scale: Poor     Standing balance support: Bilateral upper extremity supported Standing balance-Leahy Scale: Poor Standing balance comment: pt requires bil UE support                            Cognition Arousal/Alertness: Lethargic(pt required increased time to wake up) Behavior During Therapy: Flat affect Overall Cognitive Status: Impaired/Different from baseline Area of Impairment: Orientation;Problem solving;Attention;Memory;Following commands;Safety/judgement;Awareness                 Orientation Level: Disoriented to;Person;Time;Place;Situation(unsure if it's expressive aphasia or confusion) Current Attention Level: Focused   Following Commands: Follows one  step commands inconsistently;Follows one step commands with increased time Safety/Judgement: Decreased awareness of deficits;Decreased awareness of safety(possible L sided  neglect) Awareness: Intellectual Problem Solving: Slow processing;Decreased initiation;Difficulty sequencing;Requires tactile cues;Requires verbal cues General Comments: pt requiring increased time and freq verbal and tactile cues to complete task. Pt with improved simple verbal command follow today vs yesterday requiring demonstration. Pt with noted difficulty tending to L side and impaired processing when amb and trying to walk forward into wall despite cues to turn left, pt with improvement in management directions when glasses were donned      Exercises      General Comments General comments (skin integrity, edema, etc.): SpO2 >93% on 5Lo2 via [place] t/o session      Pertinent Vitals/Pain Pain Assessment: Faces Faces Pain Scale: Hurts a little bit Pain Location: Generalized Pain Descriptors / Indicators: Grimacing(during transfer to EOB) Pain Intervention(s): Monitored during session    Home Living                      Prior Function            PT Goals (current goals can now be found in the care plan section) Progress towards PT goals: Progressing toward goals    Frequency    Min 4X/week      PT Plan Current plan remains appropriate    Co-evaluation              AM-PAC PT "6 Clicks" Mobility   Outcome Measure  Help needed turning from your back to your side while in a flat bed without using bedrails?: A Little Help needed moving from lying on your back to sitting on the side of a flat bed without using bedrails?: A Little Help needed moving to and from a bed to a chair (including a wheelchair)?: A Little Help needed standing up from a chair using your arms (e.g., wheelchair or bedside chair)?: A Little Help needed to walk in hospital room?: A Little Help needed climbing 3-5 steps with a railing? : A Lot 6 Click Score: 17    End of Session Equipment Utilized During Treatment: Gait belt Activity Tolerance: Patient tolerated treatment well Patient  left: in chair;with call bell/phone within reach;with chair alarm set;with family/visitor present Nurse Communication: Mobility status PT Visit Diagnosis: Unsteadiness on feet (R26.81);Other symptoms and signs involving the nervous system (R29.898)     Time: 6503-5465 PT Time Calculation (min) (ACUTE ONLY): 42 min  Charges:  $Gait Training: 8-22 mins $Therapeutic Activity: 8-22 mins $Neuromuscular Re-education: 8-22 mins                     Danielle Clarke, PT, DPT Acute Rehabilitation Services Pager #: [phone] Office #: [phone]    Danielle Clarke 02/09/2019, 11:10 AM

## 2019-02-10 ENCOUNTER — Encounter (HOSPITAL_COMMUNITY)
Admission: EM | Disposition: A | Discharge status: Rehabilitation Facility | Payer: Self-pay | Source: Other Acute Inpatient Hospital | Attending: Internal Medicine

## 2019-02-10 DIAGNOSIS — I442 Atrioventricular block, complete: Secondary | ICD-10-CM

## 2019-02-10 HISTORY — PX: PACEMAKER IMPLANT: EP1218

## 2019-02-10 LAB — GLUCOSE, CAPILLARY
Glucose-Capillary: 141 mg/dL — ABNORMAL HIGH (ref 70–99)
Glucose-Capillary: 153 mg/dL — ABNORMAL HIGH (ref 70–99)
Glucose-Capillary: 219 mg/dL — ABNORMAL HIGH (ref 70–99)
Glucose-Capillary: 265 mg/dL — ABNORMAL HIGH (ref 70–99)
Glucose-Capillary: 268 mg/dL — ABNORMAL HIGH (ref 70–99)
Glucose-Capillary: 305 mg/dL — ABNORMAL HIGH (ref 70–99)

## 2019-02-10 LAB — COMPREHENSIVE METABOLIC PANEL
ALT: 19 U/L (ref 0–44)
AST: 20 U/L (ref 15–41)
Albumin: 3.1 g/dL — ABNORMAL LOW (ref 3.5–5.0)
Alkaline Phosphatase: 65 U/L (ref 38–126)
Anion gap: 12 (ref 5–15)
BUN: 29 mg/dL — ABNORMAL HIGH (ref 8–23)
CO2: 29 mmol/L (ref 22–32)
Calcium: 8.5 mg/dL — ABNORMAL LOW (ref 8.9–10.3)
Chloride: 107 mmol/L (ref 98–111)
Creatinine, Ser: 0.92 mg/dL (ref 0.44–1.00)
GFR calc Af Amer: >60 mL/min (ref >60)
GFR calc non Af Amer: >60 mL/min (ref >60)
Glucose, Bld: 154 mg/dL — ABNORMAL HIGH (ref 70–99)
Potassium: 2.5 mmol/L — CL (ref 3.5–5.1)
Sodium: 148 mmol/L — ABNORMAL HIGH (ref 135–145)
Total Bilirubin: 1.1 mg/dL (ref 0.3–1.2)
Total Protein: 6.5 g/dL (ref 6.5–8.1)

## 2019-02-10 LAB — BASIC METABOLIC PANEL
Anion gap: 14 (ref 5–15)
BUN: 36 mg/dL — ABNORMAL HIGH (ref 8–23)
CO2: 26 mmol/L (ref 22–32)
Calcium: 8.5 mg/dL — ABNORMAL LOW (ref 8.9–10.3)
Chloride: 111 mmol/L (ref 98–111)
Creatinine, Ser: 0.93 mg/dL (ref 0.44–1.00)
GFR calc Af Amer: >60 mL/min (ref >60)
GFR calc non Af Amer: 60 mL/min — ABNORMAL LOW (ref >60)
Glucose, Bld: 309 mg/dL — ABNORMAL HIGH (ref 70–99)
Potassium: 4.1 mmol/L (ref 3.5–5.1)
Sodium: 151 mmol/L — ABNORMAL HIGH (ref 135–145)

## 2019-02-10 LAB — CBC
HCT: 32.5 % — ABNORMAL LOW (ref 36.0–46.0)
Hemoglobin: 10.8 g/dL — ABNORMAL LOW (ref 12.0–15.0)
MCH: 30.4 pg (ref 26.0–34.0)
MCHC: 33.2 g/dL (ref 30.0–36.0)
MCV: 91.5 fL (ref 80.0–100.0)
Platelets: 265 10*3/uL (ref 150–400)
RBC: 3.55 MIL/uL — ABNORMAL LOW (ref 3.87–5.11)
RDW: 14.1 % (ref 11.5–15.5)
WBC: 15.1 10*3/uL — ABNORMAL HIGH (ref 4.0–10.5)
nRBC: 0 % (ref 0.0–0.2)

## 2019-02-10 LAB — SURGICAL PCR SCREEN
MRSA, PCR: NEGATIVE
Staphylococcus aureus: NEGATIVE

## 2019-02-10 LAB — MAGNESIUM: Magnesium: 2.1 mg/dL (ref 1.7–2.4)

## 2019-02-10 SURGERY — PACEMAKER IMPLANT
Anesthesia: LOCAL

## 2019-02-10 MED ORDER — YOU HAVE A PACEMAKER BOOK
Freq: Once | Status: AC
  Administered 2019-02-11: 1

## 2019-02-10 MED ORDER — CLINDAMYCIN PHOSPHATE 300 MG/50ML IV SOLN
300.0000 mg | Freq: Four times a day (QID) | INTRAVENOUS | Status: DC
  Administered 2019-02-10 – 2019-02-12 (×7): 300 mg via INTRAVENOUS
  Filled 2019-02-10 (×8): qty 50

## 2019-02-10 MED ORDER — FENTANYL CITRATE (PF) 100 MCG/2ML IJ SOLN
INTRAMUSCULAR | Status: DC | PRN
  Administered 2019-02-10: 12.5 ug via INTRAVENOUS

## 2019-02-10 MED ORDER — CHLORHEXIDINE GLUCONATE 4 % EX LIQD
60.0000 mL | Freq: Once | CUTANEOUS | Status: DC
  Filled 2019-02-10: qty 60

## 2019-02-10 MED ORDER — SODIUM CHLORIDE 0.9 % IV SOLN: INTRAVENOUS | Status: DC

## 2019-02-10 MED ORDER — SODIUM CHLORIDE 0.9 % IV SOLN
INTRAVENOUS | Status: AC
  Filled 2019-02-10: qty 2

## 2019-02-10 MED ORDER — POTASSIUM CHLORIDE 20 MEQ/15ML (10%) PO SOLN
40.0000 meq | Freq: Once | ORAL | Status: AC
  Administered 2019-02-10: 40 meq
  Filled 2019-02-10: qty 30

## 2019-02-10 MED ORDER — POTASSIUM CHLORIDE 10 MEQ/100ML IV SOLN
INTRAVENOUS | Status: AC
  Filled 2019-02-10: qty 300

## 2019-02-10 MED ORDER — FENTANYL CITRATE (PF) 100 MCG/2ML IJ SOLN
INTRAMUSCULAR | Status: AC
  Filled 2019-02-10: qty 2

## 2019-02-10 MED ORDER — OSMOLITE 1.2 CAL PO LIQD
1000.0000 mL | ORAL | Status: DC
  Administered 2019-02-11: 1000 mL
  Filled 2019-02-10 (×4): qty 1000

## 2019-02-10 MED ORDER — CHLORHEXIDINE GLUCONATE 4 % EX LIQD
60.0000 mL | Freq: Once | CUTANEOUS | Status: AC
  Administered 2019-02-10: 4 via TOPICAL
  Filled 2019-02-10: qty 60

## 2019-02-10 MED ORDER — MIDAZOLAM HCL 5 MG/5ML IJ SOLN
INTRAMUSCULAR | Status: AC
  Filled 2019-02-10: qty 5

## 2019-02-10 MED ORDER — HEPARIN (PORCINE) IN NACL 1000-0.9 UT/500ML-% IV SOLN
INTRAVENOUS | Status: AC
  Filled 2019-02-10: qty 500

## 2019-02-10 MED ORDER — LIDOCAINE HCL (PF) 1 % IJ SOLN
INTRAMUSCULAR | Status: DC | PRN
  Administered 2019-02-10: 50 mL

## 2019-02-10 MED ORDER — ACETAMINOPHEN 325 MG PO TABS: 325.0000 mg | ORAL_TABLET | ORAL | Status: DC | PRN

## 2019-02-10 MED ORDER — POTASSIUM CHLORIDE 10 MEQ/100ML IV SOLN
10.0000 meq | INTRAVENOUS | Status: AC
  Administered 2019-02-10 (×3): 10 meq via INTRAVENOUS

## 2019-02-10 MED ORDER — METOPROLOL TARTRATE 25 MG PO TABS
25.0000 mg | ORAL_TABLET | Freq: Two times a day (BID) | ORAL | Status: DC
  Administered 2019-02-10 – 2019-02-14 (×9): 25 mg via ORAL
  Filled 2019-02-10 (×9): qty 1

## 2019-02-10 MED ORDER — ONDANSETRON HCL 4 MG/2ML IJ SOLN: 4.0000 mg | Freq: Four times a day (QID) | INTRAMUSCULAR | Status: DC | PRN

## 2019-02-10 MED ORDER — LIDOCAINE HCL 1 % IJ SOLN
INTRAMUSCULAR | Status: AC
  Filled 2019-02-10: qty 60

## 2019-02-10 MED ORDER — SODIUM CHLORIDE 0.9 % IV SOLN
80.0000 mg | INTRAVENOUS | Status: AC
  Administered 2019-02-10: 80 mg

## 2019-02-10 MED ORDER — MIDAZOLAM HCL 5 MG/5ML IJ SOLN
INTRAMUSCULAR | Status: DC | PRN
  Administered 2019-02-10: 1 mg via INTRAVENOUS

## 2019-02-10 SURGICAL SUPPLY — 7 items
CABLE SURGICAL S-101-97-12 (CABLE) ×2 IMPLANT
LEAD SOLIA S PRO MRI 45 (Lead) ×1 IMPLANT
LEAD SOLIA S PRO MRI 53 (Lead) ×1 IMPLANT
PACEMAKER EDORA 8DR-T MRI (Pacemaker) ×1 IMPLANT
PAD PRO RADIOLUCENT 2001M-C (PAD) ×2 IMPLANT
SHEATH CLASSIC 7F (SHEATH) ×2 IMPLANT
TRAY PACEMAKER INSERTION (PACKS) ×2 IMPLANT

## 2019-02-10 NOTE — Discharge Instructions (Signed)
° ° °  Supplemental Discharge Instructions for  Pacemaker/Defibrillator Patients  Activity No heavy lifting or vigorous activity with your left/right arm for 6 to 8 weeks.  Do not raise your left/right arm above your head for one week.  Gradually raise your affected arm as drawn below.              02/14/2019                02/15/2019                02/16/2019               02/17/2019 __  NO DRIVING util cleared to at your wound check svisit  WOUND CARE - Keep the wound area clean and dry.  Do not get this area wet for one week. No showers for one week; you may shower on  02/17/2019   . - The tape/steri-strips on your wound will fall off; do not pull them off.  No bandage is needed on the site.  DO  NOT apply any creams, oils, or ointments to the wound area. - If you notice any drainage or discharge from the wound, any swelling or bruising at the site, or you develop a fever > 101? F after you are discharged home, call the office at once.  Special Instructions - You are still able to use cellular telephones; use the ear opposite the side where you have your pacemaker/defibrillator.  Avoid carrying your cellular phone near your device. - When traveling through airports, show security personnel your identification card to avoid being screened in the metal detectors.  Ask the security personnel to use the hand wand. - Avoid arc welding equipment, MRI testing (magnetic resonance imaging), TENS units (transcutaneous nerve stimulators).  Call the office for questions about other devices. - Avoid electrical appliances that are in poor condition or are not properly grounded. - Microwave ovens are safe to be near or to operate.

## 2019-02-10 NOTE — Progress Notes (Signed)
OT Cancellation Note  Patient Details Name: HANNAHMAE SCHMOLDT MRN: [id number] DOB: 1941-05-12   Cancelled Treatment:    Reason Eval/Treat Not Completed: Patient at procedure or test/ unavailable.  Pt in cath lab.  Jeani Hawking, OTR/L Acute Rehabilitation Services Pager [phone] Office [phone]   Jeani Hawking M 02/10/2019, 4:05 PM

## 2019-02-10 NOTE — Progress Notes (Signed)
PT Cancellation Note  Patient Details Name: Danielle Clarke MRN: [id number] DOB: [birth year]   Cancelled Treatment:    Reason Eval/Treat Not Completed: Patient at procedure or test/unavailable. Pt just underwent placement of a coretrac and is leaving momentarily for her Pacer placement. Acute PT to return as able, as appropriate.  Lewis Shock, PT, DPT Acute Rehabilitation Services Pager #: [phone] Office #: [phone]    Iona Hansen 02/10/2019, 12:04 PM

## 2019-02-10 NOTE — Progress Notes (Signed)
CRITICAL VALUE ALERT  Critical Value:  2.5 K+  Date & Time Notied:  03/16 0711  Provider Notified: Pola Corn MD  Orders Received/Actions taken: Results given to AM nurse to give to CCM. Elink was switching shifts.

## 2019-02-10 NOTE — Progress Notes (Addendum)
Progress Note  Patient Name: Danielle Clarke Date of Encounter: 02/10/2019  Primary Cardiologist: Lesleigh Noe, MD  Subjective   No complaints. Family at bedside.  Inpatient Medications    Scheduled Meds: . chlorhexidine  60 mL Topical Once  . chlorhexidine  15 mL Mouth Rinse BID  . gentamicin irrigation  80 mg Irrigation To SSTC  . insulin aspart  0-9 Units Subcutaneous Q4H  . insulin glargine  14 Units Subcutaneous Daily  . mouth rinse  15 mL Mouth Rinse q12n4p  . metoprolol tartrate  25 mg Oral BID  . pantoprazole  40 mg Intravenous Q12H   Continuous Infusions: . sodium chloride 50 mL/hr at 02/10/19 0600  . sodium chloride    . sodium chloride    . clindamycin (CLEOCIN) IV    . potassium chloride 10 mEq (02/10/19 1100)   PRN Meds: metoCLOPramide (REGLAN) injection, [DISCONTINUED] ondansetron **OR** ondansetron (ZOFRAN) IV   Vital Signs    Vitals:   02/10/19 0900 02/10/19 1000 02/10/19 1100 02/10/19 1200  BP: (!) 146/51 (!) 143/55 (!) 144/53 (!) 144/39  Pulse: (!) 103 (!) 103 94 (!) 102  Resp: (!) 24 19 (!) 24 18  Temp:    98 F (36.7 C)  TempSrc:    Oral  SpO2: 100% 100% 99% 100%  Weight:      Height:        Intake/Output Summary (Last 24 hours) at 02/10/2019 1218 Last data filed at 02/10/2019 1200 Gross per 24 hour  Intake 2378.79 ml  Output 2070 ml  Net 308.79 ml   Last 3 Weights 02/10/2019 02/09/2019 02/07/2019  Weight (lbs) 109 lb 12.6 oz 109 lb 12.6 oz 109 lb 5.6 oz  Weight (kg) 49.8 kg 49.8 kg 49.6 kg     Telemetry    NSR/sinus tach - Personally Reviewed  Physical Exam   GEN: No acute distress, elderly WF, laying 45 degrees in bed HEENT: Normocephalic, atraumatic, sclera non-icteric. Neck: No JVD Cardiac: Reg rhythm mildly tachycardic 2/6 SEM no rubs or gallops.  Radials/DP/PT 1+ and equal bilaterally.  Respiratory: Clear to auscultation bilaterally. Breathing is unlabored. GI: Soft, nontender, non-distended, BS +x 4. MS: no  deformity. Extremities: No clubbing or cyanosis. No edema. Distal pedal pulses are 2+ and equal bilaterally. Neuro:  Alert, oriented. Follows commands, seems to comprehend speech Psych: Flattened affect  Labs    Chemistry Recent Labs  Lab 02/08/19 1144 02/09/19 0624 02/10/19 0540  NA 137 144 148*  K 4.0 3.7 2.5*  CL 103 107 107  CO2 25 24 29   GLUCOSE 174* 195* 154*  BUN 26* 29* 29*  CREATININE 0.73 0.82 0.92  CALCIUM 8.5* 8.4* 8.5*  PROT 6.2* 6.3* 6.5  ALBUMIN 3.5 3.2* 3.1*  AST 27 25 20   ALT 21 21 19   ALKPHOS 62 53 65  BILITOT 0.7 0.8 1.1  GFRNONAA >60 >60 >60  GFRAA >60 >60 >60  ANIONGAP 9 13 12      Hematology Recent Labs  Lab 02/07/19 1143 02/09/19 0624 02/10/19 0540  WBC 17.0* 20.2* 15.1*  RBC 3.64* 3.61* 3.55*  HGB 11.0* 10.7* 10.8*  HCT 32.7* 33.8* 32.5*  MCV 89.8 93.6 91.5  MCH 30.2 29.6 30.4  MCHC 33.6 31.7 33.2  RDW 13.7 14.2 14.1  PLT 267 276 265    Cardiac Enzymes Recent Labs  Lab 02/07/19 0439 02/07/19 0849 02/07/19 1403  TROPONINI <0.03 <0.03 <0.03   No results for input(s): TROPIPOC in the last 168 hours.  BNPNo results for input(s): BNP, PROBNP in the last 168 hours.   DDimer No results for input(s): DDIMER in the last 168 hours.   Radiology    Vas US Carotid  Result Date: 02/10/2019 Carotid Arterial Duplex Study Indications:       Syncope. Limitations:       constant movement Comparison Study:  Prior study from 01/05/17 is available for comparison. Performing Technologist: Sherren Kerns RVS  Examination Guidelines: A complete evaluation includes B-mode imaging, spectral Doppler, color Doppler, and power Doppler as needed of all accessible portions of each vessel. Bilateral testing is considered an integral part of a complete examination. Limited examinations for reoccurring indications may be performed as noted.  Right Carotid Findings: +----------+--------+--------+--------+------------+------------------+           PSV cm/sEDV  cm/sStenosisDescribe    Comments           +----------+--------+--------+--------+------------+------------------+ CCA Prox  107     14                          intimal thickening +----------+--------+--------+--------+------------+------------------+ CCA Distal95      16                          intimal thickening +----------+--------+--------+--------+------------+------------------+ ICA Prox  91      20              heterogenous                   +----------+--------+--------+--------+------------+------------------+ ICA Distal113     24                                             +----------+--------+--------+--------+------------+------------------+ ECA       173     9               calcific                       +----------+--------+--------+--------+------------+------------------+ +----------+--------+-------+--------+-------------------+           PSV cm/sEDV cmsDescribeArm Pressure (mmHG) +----------+--------+-------+--------+-------------------+ [id number]                                        +----------+--------+-------+--------+-------------------+ +---------+--------+--------+----------------------------------+ VertebralPSV cm/sEDV cm/sNot assessed and constant movement +---------+--------+--------+----------------------------------+  Left Carotid Findings: +----------+--------+--------+--------+-----------+------------------+           PSV cm/sEDV cm/sStenosisDescribe   Comments           +----------+--------+--------+--------+-----------+------------------+ CCA Prox  96      15                         intimal thickening +----------+--------+--------+--------+-----------+------------------+ CCA Distal84      17                         intimal thickening +----------+--------+--------+--------+-----------+------------------+ ICA Prox  78      19              homogeneous                    +----------+--------+--------+--------+-----------+------------------+ ICA Distal90      20                                            +----------+--------+--------+--------+-----------+------------------+  ECA       296     9               calcific                      +----------+--------+--------+--------+-----------+------------------+ +----------+--------+--------+--------+-------------------+ SubclavianPSV cm/sEDV cm/sDescribeArm Pressure (mmHG) +----------+--------+--------+--------+-------------------+           138                                         +----------+--------+--------+--------+-------------------+ +---------+--------+---+--------+--+ VertebralPSV cm/s135EDV cm/s22 +---------+--------+---+--------+--+  Summary: Right Carotid: Velocities in the right ICA are consistent with a 1-39% stenosis. Left Carotid: Velocities in the left ICA are consistent with a 1-39% stenosis. Vertebrals:  Left vertebral artery demonstrates antegrade flow. Subclavians: Normal flow hemodynamics were seen in bilateral subclavian              arteries. *See table(s) above for measurements and observations.  Electronically signed by Gretta Began MD on 02/10/2019 at 7:42:06 AM.    Final     Cardiac Studies   2d echo 02/07/19 IMPRESSIONS  1. The left ventricle has normal systolic function with an ejection fraction of 60-65%. The cavity size was normal. There is mildly increased left ventricular wall thickness. Left ventricular diastolic Doppler parameters are consistent with impaired  relaxation. No evidence of left ventricular regional wall motion abnormalities.  2. The right ventricle has normal systolic function. The cavity was normal. There is no increase in right ventricular wall thickness.  3. Trivial pericardial effusion is present.  4. The mitral valve is degenerative. Mild thickening of the mitral valve leaflet. There is moderate mitral annular calcification present. No  evidence of mitral valve stenosis. Trivial mitral regurgitation.  5. The aortic valve is tricuspid Severe calcifcation of the aortic valve. moderate-severe stenosis of the aortic valve. Mean gradient 24 mmHg with calculated valve area 0.9 cm^2. Possible paradoxical low flow/low gradient aortic stenosis. If clinical  concern, would consider TEE or cath assess further.  6. The aortic root and ascending aorta are normal in size and structure.  7. The IVC was poorly visualized. Peak RV-RA gradient 43 mmHg.  8. Technically difficult study with poor acoustic windows.  9. Left atrial size was mildly dilated.  Patient Profile     78 y.o. female with mod-severe aortic stenosis, arthritis, asthmatic bronchitis, carotid artery disease bilateral ICA hypertension, hyperlipidemia, diabetes, syncope passed out at dental visit with closed head trama, developed left frontal IPH with SAH and minimal/DAI. Course complicated for dysphagia requiring tube feeds, hypokalemia being managed by primary team, gastroparesis, anemia, episodes of hypoxia. EP following for multiple episodes of intermittent high-grade heart block associated with PP prolongation and loss of plethysmography consistent with vasodilatation.  Assessment & Plan    1. 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 Dr. Ladona Ridgel discussed risk/benefits and expected outcomes. Pt and family agreeable to proceed. Per notes, unclear how much of this was also exacerbated by Precedex, but has had episodes of syncope and vagal issues in the past so felt indicated in this setting.  2. SAH/TBI - per primary team. Being managed with supportive care at present time.  3. Moderate-severe aortic stenosis - will need surveillance of symptoms as outpatient.  4. Episodes of hypoxia - per PCCM.  For questions or updates, please contact [facility] Please consult [email or web address] for contact  info under Cardiology/STEMI.  Signed, Laurann Montana, PA-C  02/10/2019, 12:18 PM    EP Attending   Patient seen and examined. Discussed with Dr. Crissie Sickles. I have reviewed the indications/risks/benefits/goals/expectations and she wishes to proceed.  Leonia Reeves.D.

## 2019-02-10 NOTE — Procedures (Signed)
Cortrak  Person Inserting Tube:  Kristen Bushway M, RD Tube Type:  Cortrak - 43 inches Tube Location:  Left nare Initial Placement:  Stomach Secured by: Bridle Technique Used to Measure Tube Placement:  Documented cm marking at nare/ corner of mouth Cortrak Secured At:  60 cm    Cortrak Tube Team Note:  Consult received to place a Cortrak feeding tube.   No x-ray is required. RN may begin using tube.   If the tube becomes dislodged please keep the tube and contact the Cortrak team at [email or web address] ([email or web address]) for replacement.  If after hours and replacement cannot be delayed, place a NG tube and confirm placement with an abdominal x-ray.      Danielle Elderkin, MS, RD, LDN, CNSC Inpatient Clinical Dietitian Pager # [phone] After hours/weekend pager # [phone]   

## 2019-02-10 NOTE — Progress Notes (Signed)
SLP Cancellation Note  Patient Details Name: Danielle Clarke MRN: [id number] DOB: 1941-08-12   Cancelled treatment:        Had planned on PO trials with likely instrumental assessment however she is NPO to receive a pacer today. ST seeing pt for speech-language-cognition as well.    Royce Macadamia 02/10/2019, 8:39 AM   Breck Coons Lonell Face.Ed Nurse, children's [phone] Office [phone]

## 2019-02-10 NOTE — Progress Notes (Signed)
Initial Nutrition Assessment  DOCUMENTATION CODES:   Not applicable  INTERVENTION:   TF regimen via NG tube as follows:  Osmolite 1.2 at 50 mL/hr - Provides 1200 mL formula, 1440 kcal, 67 g protein, and 984 mL free water. This meets 100% of estimated energy and protein needs.   NUTRITION DIAGNOSIS:   Inadequate oral intake related to dysphagia as evidenced by NPO status.  GOAL:   Patient will meet greater than or equal to 90% of their needs  MONITOR:   Diet advancement, TF tolerance  REASON FOR ASSESSMENT:   Consult Enteral/tube feeding initiation and management  ASSESSMENT:   78 yo female, admitted with intracranial bleeding s/p fall, loss of consciousness. PMH significant for DM type 1, HLD, HTN, osteoporosis.  3/15: SLP recommends NPO 3/16: NG tube placed via CorTrak; pacemaker placement planned  Labs reviewed: sodium 148 (H), potassium 2.5 (L) - repleting, glucose 154 mg/dL - on insulin Medications reviewed and include: Novolog, Lantus, Protonix, NS at 50 mL/hr, KCl 10 mEq q 6 hours  Pt alert and pleasant, resting in bed at time of visit, preparing for transport. Reports normal PO intake and appetite PTA. Wt stable. Denies current nausea or vomiting, trouble with bowels.  Will initiate TF regimen and monitor for tolerance.  NUTRITION - FOCUSED PHYSICAL EXAM: Deferred - pt preparing for transport to surgery  Diet Order:  No known food allergies Diet Order            Diet NPO time specified Except for: Sips with Meds  Diet effective midnight        Diet NPO time specified  Diet effective now              EDUCATION NEEDS:  Education needs have been addressed  Skin:  Skin Assessment: Skin Integrity Issues: Skin Integrity Issues:: Other (Comment) Other: Wound - posterior head; Petechiae - abdomen  Last BM:  3/15, type 4  Height:  Ht Readings from Last 1 Encounters:  02/06/19 5' (1.524 m)    Weight:  Wt Readings from Last 10 Encounters:   02/10/19 49.8 kg  01/08/19 51.3 kg  12/24/18 51.3 kg  12/21/18 51.3 kg  12/03/18 52.2 kg  10/22/18 52.2 kg  10/15/18 52.2 kg  10/08/18 52.2 kg  10/04/18 52.2 kg  09/27/18 52.2 kg  1.5 kg wt loss x 1 month = not indicative of malnutrition Wt stable x 4 months prior  Ideal Body Weight:  45.5 kg  BMI:  Body mass index is 21.44 kg/m. normal  Estimated Nutritional Needs: calculated based on 3/16 wt (~50 kg)  Kcal:  1250-1500 (25-30 kcal/kg)  Protein:  50-65 gm (1-1.3 g/kg)  Fluid:  >/= 1.5 L daily or per MD  Jolaine Artist, MS, RDN, LDN Pager: [phone] Available Mondays and Fridays, 9am-2pm

## 2019-02-10 NOTE — Progress Notes (Signed)
NAME:  Danielle Clarke, MRN:  [id number], DOB:  09/02/1941, LOS: 3 ADMISSION DATE:  02/06/2019, CONSULTATION DATE:  02/07/2019 REFERRING MD:  Toniann Fail, CHIEF COMPLAINT:  Intracranial hemorrhage  HPI  78 year old with syncopal episode after dental visit with closed head trauma.  No operative intervention planned at this time given mild symptoms of dysarthria.  Admitted to PCCM for strict BP control.   Past Medical History  Aortic stenosis, arthritis, asthmatic bronchitis, carotid artery disease bilateral ICA hypertension, hyperlipidemia, diabetes, syncope    diagnostics   CT head 3/13 and MRI brain 3/12: left frontal IPH with SAH and minimal/DAI.  (personally reviewed) MRI brain 3/13: Intraparenchymal hematoma centered at the anterior left frontal lobe again seen, measuring 3.9 x 4.1 x 5.1 cm (estimated volume 40 cc, previously 10 cc). Internal fluid fluid level. Localized edema and regional mass effect mildly increased, with partial effacement of the frontal horn of the adjacent left lateral ventricle. Mild 4 mm left-to-right midline shift. Additional small hemorrhagic contusions at the temporal poles bilaterally, measuring 17 mm on the right (series 26, image 21) and 14 mm on the left (series 26, image 19). Localized edema without significant regional mass effect. Moderate volume subarachnoid hemorrhage involving the left greater than right cerebral hemispheres with extension into the sylvian fissures. Small amount of subarachnoid seen within the interpeduncular cistern. Small volume intraventricular hemorrhage now seen, likely related to redistribution. No hydrocephalus or ventricular trapping.  Hospital course  3/12: Admitted following syncopal episode resulting in traumatic bleed 3/13: vomiting.  Felt more likely gastroparesis.  Imaging showing left frontal lobe intraparenchymal hematoma, with localized cerebral edema and mass-effect with mild left to right shift, having episodes of  bradycardia and pauses.  Seen by neurosurgery; Recommending blood pressure control 3/14: Episodes of bradycardic pauses.  Nausea vomiting better.  Seen by cardiology, they will be planning on pacemaker on 3/16 3/15: More awake, seen by SLP, deemed high aspiration risk, blood pressure improved, mental status improved; checking UC  3/16 Cortrak placed  Interim history/subjective:  No acute events.  Required cardene briefly this AM but has been off for 45 - 60 min. Had cortrak placed this AM.  Objective   Blood pressure (!) 144/53, pulse 94, temperature 98.8 F (37.1 C), temperature source Oral, resp. rate (!) 24, height 5' (1.524 m), weight 49.8 kg, SpO2 99 %.        Intake/Output Summary (Last 24 hours) at 02/10/2019 1142 Last data filed at 02/10/2019 1100 Gross per 24 hour  Intake 2328.81 ml  Output 2620 ml  Net -291.19 ml   Filed Weights   02/07/19 0353 02/09/19 0500 02/10/19 0336  Weight: 49.6 kg 49.8 kg 49.8 kg   Examination: General: Elderly female, resting in bed, in NAD. Neuro: A&O x 3, no deficits. HEENT: [place]/AT. Sclerae anicteric, EOMI. Cardiovascular: RRR, 3/6 SEM radiating into carotids. Lungs: Respirations even and unlabored.  CTA bilaterally, No W/R/R. Abdomen: BS x 4, soft, NT/ND.  Musculoskeletal: No gross deformities, no edema.  Skin: Intact, warm, no rashes.   Assessment & Plan:   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 with associated cerebral edema and left to right midline shift as well as small volume subarachnoid bleeding - No role for surgery. Plan Continuing supportive care  Blood pressure goal < 160 per neurosurgery Metoprolol 25mg  BID added for above goal now that cortrak is in Serial neuro checks  Dysphasia - s/p cortrak placement 3/16 Plan Start TF's until [facility] pending MBS pending  Recurrent syncope with episodes of complete heart block following vagal stimulation - unclear how much of this was also exacerbated by Precedex,  but has had episodes of syncope and vagal issues in the past Plan For pacemaker insertion by EP this afternoon Continue telemetry Ensure K > 4, Mg > 2  Hypokalemia - s/p 3 runs K via IV Plan Additional 40 mEq K per tube now Repeat BMP at 1500  Episodes of hypoxia Portable chest x-ray: Negative for pulmonary edema or infiltrate Plan Pulse oximetry Aspiration precautions  Nausea and vomiting with intermittent coffee ground emesis - Likely diabetic gastroparesis, now improved.  S/p PPI infusion. Plan Continuing PPI BID for now Reglan as needed  Diabetes Plan Sliding scale insulin with basal coverage  Anemia without evidence of bleeding Plan Trend CBC Transfuse for Hgb < 7   Best practice:  Diet: NPO for pacemaker.  TF's after PPM and until has MBS Pain/Anxiety/Delirium protocol (if indicated): none required VAP protocol (if indicated): n/a DVT prophylaxis: SCD GI prophylaxis: PPI Glucose control: SSI Mobility: Bedrest with restrains for safety. High fall risk Code Status: Full  Family Communication: Daughter and son updated at bedside. Disposition:  ICU for now.  If PPM placement goes smoothly and no acute events, then can consider transfer out to tele this afternoon.  Critical care time: 30 min.    Rutherford Guys, PA [facility] Pager: [phone].  If no answer, [phone] 02/10/2019, 12:03 PM

## 2019-02-10 NOTE — Progress Notes (Signed)
  NEUROSURGERY PROGRESS NOTE   No issues overnight.   EXAM:  BP (!) 144/39   Pulse (!) 102   Temp 98 F (36.7 C) (Oral)   Resp 18   Ht 5' (1.524 m)   Wt 49.8 kg   SpO2 100%   BMI 21.44 kg/m   Awake, alert, oriented  Paucity of speech but does answer some questions. Following commands CN grossly intact  Good strength  IMPRESSION:  78 y.o. female s/p fall with left frontal hemorrhage, appears to be significantly improved over weekend from neurologic standpoint - Heart block requiring pacer  PLAN: - Cont supportive care  - EP to place pacer - Can f/u in outpatient NS clinic in 3-4 weeks

## 2019-02-11 ENCOUNTER — Inpatient Hospital Stay (HOSPITAL_COMMUNITY): Payer: Medicare Other

## 2019-02-11 ENCOUNTER — Encounter (HOSPITAL_COMMUNITY): Payer: Self-pay | Admitting: Internal Medicine

## 2019-02-11 LAB — MAGNESIUM: Magnesium: 2.4 mg/dL (ref 1.7–2.4)

## 2019-02-11 LAB — PHOSPHORUS: Phosphorus: 2.1 mg/dL — ABNORMAL LOW (ref 2.5–4.6)

## 2019-02-11 LAB — CBC
HCT: 36.4 % (ref 36.0–46.0)
Hemoglobin: 11.8 g/dL — ABNORMAL LOW (ref 12.0–15.0)
MCH: 30.3 pg (ref 26.0–34.0)
MCHC: 32.4 g/dL (ref 30.0–36.0)
MCV: 93.3 fL (ref 80.0–100.0)
Platelets: 278 10*3/uL (ref 150–400)
RBC: 3.9 MIL/uL (ref 3.87–5.11)
RDW: 14.6 % (ref 11.5–15.5)
WBC: 7.9 10*3/uL (ref 4.0–10.5)
nRBC: 0 % (ref 0.0–0.2)

## 2019-02-11 LAB — GLUCOSE, CAPILLARY
Glucose-Capillary: 250 mg/dL — ABNORMAL HIGH (ref 70–99)
Glucose-Capillary: 313 mg/dL — ABNORMAL HIGH (ref 70–99)
Glucose-Capillary: 321 mg/dL — ABNORMAL HIGH (ref 70–99)
Glucose-Capillary: 289 mg/dL — ABNORMAL HIGH (ref 70–99)
Glucose-Capillary: 333 mg/dL — ABNORMAL HIGH (ref 70–99)
Glucose-Capillary: 420 mg/dL — ABNORMAL HIGH (ref 70–99)
Glucose-Capillary: 228 mg/dL — ABNORMAL HIGH (ref 70–99)

## 2019-02-11 LAB — BASIC METABOLIC PANEL
Anion gap: 13 (ref 5–15)
BUN: 35 mg/dL — ABNORMAL HIGH (ref 8–23)
CO2: 27 mmol/L (ref 22–32)
Calcium: 8.8 mg/dL — ABNORMAL LOW (ref 8.9–10.3)
Chloride: 113 mmol/L — ABNORMAL HIGH (ref 98–111)
Creatinine, Ser: 0.93 mg/dL (ref 0.44–1.00)
GFR calc Af Amer: >60 mL/min (ref >60)
GFR calc non Af Amer: 60 mL/min — ABNORMAL LOW (ref >60)
Glucose, Bld: 261 mg/dL — ABNORMAL HIGH (ref 70–99)
Potassium: 3.2 mmol/L — ABNORMAL LOW (ref 3.5–5.1)
Sodium: 153 mmol/L — ABNORMAL HIGH (ref 135–145)

## 2019-02-11 MED ORDER — ACETAMINOPHEN 325 MG PO TABS
650.0000 mg | ORAL_TABLET | Freq: Four times a day (QID) | ORAL | Status: DC | PRN
  Administered 2019-02-14: 650 mg via ORAL
  Filled 2019-02-11: qty 2

## 2019-02-11 MED ORDER — INSULIN GLARGINE 100 UNIT/ML ~~LOC~~ SOLN: 18.0000 [IU] | Freq: Every day | SUBCUTANEOUS | Status: DC

## 2019-02-11 MED ORDER — INSULIN ASPART 100 UNIT/ML ~~LOC~~ SOLN
16.0000 [IU] | Freq: Once | SUBCUTANEOUS | Status: AC
  Administered 2019-02-11: 16 [IU] via SUBCUTANEOUS

## 2019-02-11 MED ORDER — POTASSIUM CHLORIDE 20 MEQ/15ML (10%) PO SOLN
40.0000 meq | Freq: Once | ORAL | Status: DC
  Filled 2019-02-11: qty 30

## 2019-02-11 MED ORDER — FREE WATER
200.0000 mL | Freq: Four times a day (QID) | Status: DC
  Administered 2019-02-12: 200 mL

## 2019-02-11 MED ORDER — INSULIN GLARGINE 100 UNIT/ML ~~LOC~~ SOLN
18.0000 [IU] | Freq: Every day | SUBCUTANEOUS | Status: DC
  Filled 2019-02-11: qty 0.18

## 2019-02-11 MED ORDER — INSULIN ASPART 100 UNIT/ML ~~LOC~~ SOLN
0.0000 [IU] | SUBCUTANEOUS | Status: DC
  Administered 2019-02-12: 8 [IU] via SUBCUTANEOUS
  Administered 2019-02-12: 5 [IU] via SUBCUTANEOUS
  Administered 2019-02-12 (×2): 3 [IU] via SUBCUTANEOUS
  Administered 2019-02-12 (×2): 15 [IU] via SUBCUTANEOUS
  Administered 2019-02-13: 2 [IU] via SUBCUTANEOUS
  Administered 2019-02-13: 5 [IU] via SUBCUTANEOUS
  Administered 2019-02-13: 11 [IU] via SUBCUTANEOUS
  Administered 2019-02-13: 8 [IU] via SUBCUTANEOUS
  Administered 2019-02-14: 15 [IU] via SUBCUTANEOUS
  Administered 2019-02-14: 2 [IU] via SUBCUTANEOUS

## 2019-02-11 MED FILL — Gentamicin Sulfate Inj 40 MG/ML: INTRAMUSCULAR | Qty: 80 | Status: AC

## 2019-02-11 MED FILL — Lidocaine HCl Local Inj 1%: INTRAMUSCULAR | Qty: 60 | Status: AC

## 2019-02-11 NOTE — Progress Notes (Addendum)
Inpatient Diabetes Program Recommendations  AACE/ADA: New Consensus Statement on Inpatient Glycemic Control  Target Ranges:  Prepandial:   less than 140 mg/dL      Peak postprandial:   less than 180 mg/dL (1-2 hours)      Critically ill patients:  140 - 180 mg/dL   Results for Danielle Clarke, Danielle Clarke (MRN [id number]) as of 02/11/2019 09:35  Ref. Range 02/10/2019 07:39 02/10/2019 12:43 02/10/2019 16:19 02/10/2019 20:54 02/10/2019 23:47 02/11/2019 03:06  Glucose-Capillary Latest Ref Range: 70 - 99 mg/dL 196 (H) 222 (H) 979 (H) 268 (H) 305 (H) 250 (H)   Review of Glycemic Control  Diabetes history: DM1 Outpatient Diabetes medications: Tresiba 10 units daily, Humalog 2-13 units QID Current orders for Inpatient glycemic control: Lantus 14 units daily, Novolog 0-9 units Q4H  Inpatient Diabetes Program Recommendations:   Insulin - Basal: Noted Lantus was NOT GIVEN on 02/10/19 (charted as Not Given with reason as pt NPO for procedure). Therefore, glucose has been consistently elevated over the past 20 hours. Patient has Type 1 DM and requires basal insulin. RN should administer basal insulin unless MD order is given to hold.   Insulin-Meal Coverage: If patient is eating well, she will likely need to have Novolog meal coverage ordered (may want to consider ordering Novolog 2-3 units TID with meals if patient eats at least 50% of meals).  NOTE: Patient has Type1 DM (makes NO insulin at all; requires basal, correction, and meal coverage insulin).  Patient did NOT receive Lantus on 02/10/19 and as a result glucose has been consistently elevated. Sent chat message to Dr. Sharon Seller to make aware that patient did not receive Lantus on 02/10/19. RNs, please administer insulin as ordered unless MD order is obtained to hold.    Thanks, Orlando Penner, RN, MSN, CDE Diabetes Coordinator Inpatient Diabetes Program [phone] (Team Pager from 8am to 5pm)

## 2019-02-11 NOTE — Progress Notes (Signed)
  Speech Language Pathology Treatment: Dysphagia  Patient Details Name: Danielle Clarke MRN: [id number] DOB: 12-30-1940 Today's Date: 02/11/2019 Time: 3343-5686 SLP Time Calculation (min) (ACUTE ONLY): 13 min  Assessment / Plan / Recommendation Clinical Impression  [name] cognition and language abilities has significantly improved. She was able to attend and respond appropriately to questions and requests. Speech was completely intelligible, no paraphasias noted. With delays able to state "[facility]" but not oriented to situation/diagnosis. Plan to continue to engage pt in conversational speech to diagnostically assess for dysnomias and fluency in longer utterances.  Oral cavity cleaned and immediate cough with cup sips water and continued to cough indicative of pharyngeal dysphagia. Recommend MBS which is scheduled this morning for 9:00. RN aware and will make safest recommendations per results of study.    HPI HPI: Danielle Clarke is a 78 y.o. female with history of diabetes mellitus type 1, asthmatic bronchitis, neck fx 2013, hyperlipidemia was brought to the ER after syncopal episode at the dentist office. Had some difficulty talking. CT head showed left frontal intraparenchymal hematoma and subarachnoid and subdural hematoma in both convexities (small hemorrhagic contusions at the anterior temporal poles bilaterally). Pt had coffee-ground emesis, nause/vomiting which has resolved and GI has signed off. Reported history of syncope ascribed to swallowing and coughing. Failed [employer] Screen.      SLP Plan  MBS       Recommendations  Diet recommendations: NPO(MBS within next 30 min) Medication Administration: Via alternative means                Oral Care Recommendations: Oral care QID Follow up Recommendations: Home health SLP SLP Visit Diagnosis: Cognitive communication deficit (R41.841);Dysphagia, unspecified (R13.10) Plan: MBS       GO                Royce Macadamia 02/11/2019, 8:46 AM  Breck Coons Lonell Face.Ed Nurse, children's [phone] Office [phone]

## 2019-02-11 NOTE — Progress Notes (Signed)
Pt. Refused Lantus 18 units- RN explained why med was ordered; pt. states that she takes Lantus in the mornings; Kirby,NP paged and notified.

## 2019-02-11 NOTE — Progress Notes (Signed)
[place] TEAM 1 - Stepdown/ICU TEAM  Danielle Clarke  GUY:[id number] DOB: 1941-05-27 DOA: 02/06/2019 PCP: Juluis Rainier, MD    Brief Narrative:  [phone] who has a syncopal episode after a dental visit which lead to a closed head trauma.   Significant Events: 3/12 Admit 3/13 CT > L frontal lobe intraparenchymal hematoma, with localized cerebral edema and mass-effect with mild left to right shift - bradycardia and pauses - NS consulted  3/14 bradycardic pauses - Cards consult  3/16 Cortrak placed 3/17 TRH assumed care   Subjective: RN reported possible loss of pupillary reaction this morning, but f/u RN exam reported reactive though somewhat sluggish and uneven pupils. At the time of my visit the pt is alert and oriented. She tells me she is feeling better overall. She actually has no complaints, and even denies a HA. Her RN reports that she did experience some lightheadedness when sitting up w/ therapy today. Her pupils are reactive to my exam, though they are sluggish. The L pupil is also ~45mm wider than the R.   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 & L>R midline shift - small volume subarachnoid bleeding  Evaluated by NS - no role for surgery - BP goal < 160 - f/u CT head today has noted improved ICH, but somewhat increased per-hemorrhage edema and resultant shift - cont to follow in SDU w/ serial exam   Dysphagia Cleared for a liquid diet per SLP today - cont same and follow   High grade heart block > recurrent syncope S/p pacemaker insertion 3/16 - stable per EP  Moderate-severe aortic stenosis M noted on exam - no hemodynamic issue presently   Hypokalemia  Correct to goal of 4.0  Hypernatremia Increase free water and follow  Nausea and vomiting with intermittent coffee ground emesis Likely diabetic gastroparesis - improved s/p PPI infusion  DM CBG poorly controlled - adjust tx and follow   Anemia Check anemia panel   DVT  prophylaxis: SCDs Code Status: FULL CODE Family Communication: spoke w/ dgtr at bedside   Disposition Plan: SDU  Consultants:  PCCM EP NS  Antimicrobials:  none  Objective: Blood pressure (!) 177/64, pulse 93, temperature 98.6 F (37 C), temperature source Oral, resp. rate 18, height 5' (1.524 m), weight 50.9 kg, SpO2 100 %.  Intake/Output Summary (Last 24 hours) at 02/11/2019 1433 Last data filed at 02/11/2019 0600 Gross per 24 hour  Intake 858.27 ml  Output 700 ml  Net 158.27 ml   Filed Weights   02/09/19 0500 02/10/19 0336 02/11/19 0307  Weight: 49.8 kg 49.8 kg 50.9 kg    Examination: General: No acute respiratory distress Lungs: Clear to auscultation bilaterally without wheezes or crackles Cardiovascular: Regular rate and rhythm without murmur gallop or rub normal S1 and S2 Abdomen: Nontender, nondistended, soft, bowel sounds positive, no rebound, no ascites, no appreciable mass Extremities: No significant cyanosis, clubbing, or edema bilateral lower extremities  CBC: Recent Labs  Lab 02/06/19 1957  02/09/19 0624 02/10/19 0540 02/11/19 0411  WBC 11.4*   < > 20.2* 15.1* 7.9  NEUTROABS 9.4*  --   --   --   --   HGB 11.4*   < > 10.7* 10.8* 11.8*  HCT 35.9*   < > 33.8* 32.5* 36.4  MCV 92.1   < > 93.6 91.5 93.3  PLT 273   < > 276 265 278   < > = values in this interval not displayed.  Basic Metabolic Panel: Recent Labs  Lab 02/07/19 0439  02/10/19 0540 02/10/19 1932 02/11/19 0411  NA 137   < > 148* 151* 153*  K 4.3   < > 2.5* 4.1 3.2*  CL 93*   < > 107 111 113*  CO2 26   < > 29 26 27   GLUCOSE 290*   < > 154* 309* 261*  BUN 19   < > 29* 36* 35*  CREATININE 0.90   < > 0.92 0.93 0.93  CALCIUM 9.5   < > 8.5* 8.5* 8.8*  MG 1.9  --  2.1  --  2.4  PHOS  --   --   --   --  2.1*   < > = values in this interval not displayed.   GFR: Estimated Creatinine Clearance: 37 mL/min (by C-G formula based on SCr of 0.93 mg/dL).  Liver Function Tests: Recent Labs   Lab 02/07/19 0439 02/08/19 1144 02/09/19 0624 02/10/19 0540  AST 32 27 25 20   ALT 25 21 21 19   ALKPHOS 68 62 53 65  BILITOT 0.6 0.7 0.8 1.1  PROT 6.7 6.2* 6.3* 6.5  ALBUMIN 3.7 3.5 3.2* 3.1*    Coagulation Profile: Recent Labs  Lab 02/07/19 0238  INR 0.9    Cardiac Enzymes: Recent Labs  Lab 02/07/19 0439 02/07/19 0849 02/07/19 1403  TROPONINI <0.03 <0.03 <0.03    HbA1C: Hgb A1c MFr Bld  Date/Time Value Ref Range Status  01/04/2017 06:21 PM 7.9 (H) 4.8 - 5.6 % Final    Comment:    (NOTE)         Pre-diabetes: 5.7 - 6.4         Diabetes: >6.4         Glycemic control for adults with diabetes: <7.0     CBG: Recent Labs  Lab 02/10/19 2054 02/10/19 2347 02/11/19 0306 02/11/19 0950 02/11/19 1054  GLUCAP 268* 305* 250* 313* 321*    Recent Results (from the past 240 hour(s))  MRSA PCR Screening     Status: None   Collection Time: 02/07/19  3:07 AM  Result Value Ref Range Status   MRSA by PCR NEGATIVE NEGATIVE Final    Comment:        The GeneXpert MRSA Assay (FDA approved for NASAL specimens only), is one component of a comprehensive MRSA colonization surveillance program. It is not intended to diagnose MRSA infection nor to guide or monitor treatment for MRSA infections. Performed at [facility], [address], [place], [place] [address]   Surgical PCR screen     Status: None   Collection Time: 02/10/19  6:31 AM  Result Value Ref Range Status   MRSA, PCR NEGATIVE NEGATIVE Final   Staphylococcus aureus NEGATIVE NEGATIVE Final    Comment: (NOTE) The Xpert SA Assay (FDA approved for NASAL specimens in patients 29 years of age and older), is one component of a comprehensive surveillance program. It is not intended to diagnose infection nor to guide or monitor treatment. Performed at [facility], [address], [place], [place] [address]      Scheduled Meds: . chlorhexidine  15 mL Mouth Rinse BID  . insulin aspart  0-9 Units  Subcutaneous Q4H  . insulin glargine  14 Units Subcutaneous Daily  . mouth rinse  15 mL Mouth Rinse q12n4p  . metoprolol tartrate  25 mg Oral BID  . pantoprazole  40 mg Intravenous Q12H   Continuous Infusions: . sodium chloride 50  mL/hr at 02/11/19 0600  . clindamycin (CLEOCIN) IV 300 mg (02/11/19 1143)  . feeding supplement (OSMOLITE 1.2 CAL) Stopped (02/11/19 0310)     LOS: 4 days   Lonia Blood, MD [facility] Office  [phone] Pager - Text Page per Amion  If 7PM-7AM, please contact night-coverage per Amion 02/11/2019, 2:33 PM

## 2019-02-11 NOTE — Progress Notes (Signed)
Pt started tube feeding at 12:10 AM. Pt tolerated well for 3 hrs then noted pt started gagging. Tube feeding stopped. NP Schorr informed & agreed to hold tube feeding & to be reassessed in AM.

## 2019-02-11 NOTE — Progress Notes (Addendum)
Progress Note  Patient Name: Danielle Clarke Date of Encounter: 02/11/2019  Primary Cardiologist: Lesleigh Noe, MD  Subjective   Awake but drifts to sleep, denies any CP, SOB, pain at site  Inpatient Medications    Scheduled Meds: . chlorhexidine  15 mL Mouth Rinse BID  . insulin aspart  0-9 Units Subcutaneous Q4H  . insulin glargine  14 Units Subcutaneous Daily  . mouth rinse  15 mL Mouth Rinse q12n4p  . metoprolol tartrate  25 mg Oral BID  . pantoprazole  40 mg Intravenous Q12H   Continuous Infusions: . sodium chloride 50 mL/hr at 02/11/19 0600  . clindamycin (CLEOCIN) IV 300 mg (02/11/19 3299)  . feeding supplement (OSMOLITE 1.2 CAL) Stopped (02/11/19 0310)   PRN Meds: acetaminophen, metoCLOPramide (REGLAN) injection, ondansetron (ZOFRAN) IV   Vital Signs    Vitals:   02/10/19 2123 02/11/19 0307 02/11/19 0624 02/11/19 0755  BP: (!) 160/73 (!) 108/49 (!) 165/54 (!) 170/58  Pulse: (!) 101 92  95  Resp: (!) 21 (!) 23 19 18   Temp:  98.3 F (36.8 C)  98.7 F (37.1 C)  TempSrc:  Oral  Oral  SpO2: 100% 98% 100% 98%  Weight:  50.9 kg    Height:        Intake/Output Summary (Last 24 hours) at 02/11/2019 0913 Last data filed at 02/11/2019 0600 Gross per 24 hour  Intake 1399.9 ml  Output 1000 ml  Net 399.9 ml   Last 3 Weights 02/11/2019 02/10/2019 02/09/2019  Weight (lbs) 112 lb 3.4 oz 109 lb 12.6 oz 109 lb 12.6 oz  Weight (kg) 50.9 kg 49.8 kg 49.8 kg     Telemetry    NSR/sinus tach - Personally Reviewed  Physical Exam   GEN: No acute distress, elderly WF, frail in appearance HEENT: Normocephalic, atraumatic, sclera non-icteric. Neck: No JVD Cardiac: RRR,  2/6 SEM no rubs or gallops.  Respiratory: CTA b/l. Breathing is unlabored. GI: Soft, nontender, non-distended MS: no deformity. Extremities: No clubbing or cyanosis. No edema.  Neuro:  Alert, oriented. Follows commands, seems to comprehend speech Psych: Flattened affect  PPM implant site: is  dry, no hematoma  Labs    Chemistry Recent Labs  Lab 02/08/19 1144 02/09/19 0624 02/10/19 0540 02/10/19 1932 02/11/19 0411  NA 137 144 148* 151* 153*  K 4.0 3.7 2.5* 4.1 3.2*  CL 103 107 107 111 113*  CO2 25 24 29 26 27   GLUCOSE 174* 195* 154* 309* 261*  BUN 26* 29* 29* 36* 35*  CREATININE 0.73 0.82 0.92 0.93 0.93  CALCIUM 8.5* 8.4* 8.5* 8.5* 8.8*  PROT 6.2* 6.3* 6.5  --   --   ALBUMIN 3.5 3.2* 3.1*  --   --   AST 27 25 20   --   --   ALT 21 21 19   --   --   ALKPHOS 62 53 65  --   --   BILITOT 0.7 0.8 1.1  --   --   GFRNONAA >60 >60 >60 60* 60*  GFRAA >60 >60 >60 >60 >60  ANIONGAP 9 13 12 14 13      Hematology Recent Labs  Lab 02/09/19 0624 02/10/19 0540 02/11/19 0411  WBC 20.2* 15.1* 7.9  RBC 3.61* 3.55* 3.90  HGB 10.7* 10.8* 11.8*  HCT 33.8* 32.5* 36.4  MCV 93.6 91.5 93.3  MCH 29.6 30.4 30.3  MCHC 31.7 33.2 32.4  RDW 14.2 14.1 14.6  PLT 276 265 278    Cardiac  Enzymes Recent Labs  Lab 02/07/19 0439 02/07/19 0849 02/07/19 1403  TROPONINI <0.03 <0.03 <0.03   No results for input(s): TROPIPOC in the last 168 hours.   BNPNo results for input(s): BNP, PROBNP in the last 168 hours.   DDimer No results for input(s): DDIMER in the last 168 hours.   Radiology    Dg Chest 2 View Result Date: 02/11/2019 CLINICAL DATA:  Pacemaker placement EXAM: CHEST - 2 VIEW COMPARISON:  Three days ago FINDINGS: Interval dual-chamber pacer from the left with leads over the right atrial appendage and right ventricle. Increased hazy density of the inferior chest attributed to small to moderate pleural effusions. No Kerley lines. No pneumothorax. Normal heart size. A feeding tube at least reaches the stomach. IMPRESSION: 1. New dual-chamber pacer without complicating feature. 2. New small to moderate pleural effusions. Electronically Signed   By: Marnee Spring M.D.   On: 02/11/2019 06:23    Cardiac Studies   2d echo 02/07/19 IMPRESSIONS  1. The left ventricle has normal  systolic function with an ejection fraction of 60-65%. The cavity size was normal. There is mildly increased left ventricular wall thickness. Left ventricular diastolic Doppler parameters are consistent with impaired  relaxation. No evidence of left ventricular regional wall motion abnormalities.  2. The right ventricle has normal systolic function. The cavity was normal. There is no increase in right ventricular wall thickness.  3. Trivial pericardial effusion is present.  4. The mitral valve is degenerative. Mild thickening of the mitral valve leaflet. There is moderate mitral annular calcification present. No evidence of mitral valve stenosis. Trivial mitral regurgitation.  5. The aortic valve is tricuspid Severe calcifcation of the aortic valve. moderate-severe stenosis of the aortic valve. Mean gradient 24 mmHg with calculated valve area 0.9 cm^2. Possible paradoxical low flow/low gradient aortic stenosis. If clinical  concern, would consider TEE or cath assess further.  6. The aortic root and ascending aorta are normal in size and structure.  7. The IVC was poorly visualized. Peak RV-RA gradient 43 mmHg.  8. Technically difficult study with poor acoustic windows.  9. Left atrial size was mildly dilated.  Patient Profile     78 y.o. female with mod-severe aortic stenosis, arthritis, asthmatic bronchitis, carotid artery disease bilateral ICA hypertension, hyperlipidemia, diabetes, syncope passed out at dental visit with closed head trama, developed left frontal IPH with SAH and minimal/DAI. Course complicated for dysphagia requiring tube feeds, hypokalemia being managed by primary team, gastroparesis, anemia, episodes of hypoxia. EP following for multiple episodes of intermittent high-grade heart block associated with PP prolongation and loss of plethysmography consistent with vasodilatation.  Assessment & Plan    1. High grade heart block likely vagally mediated -      S/p PPM implant  yesterday     CXR this AM without ptx     Remote transmission with intact function     A sensing lower, was unable to run A threshold 2/2 increased sinus rates     CXR is reviewed by Dr. Ladona Ridgel, stable lead positioning, no further testing needed     Continue BB  Dr. Ladona Ridgel has seen and examined the patient this AM Wound care and activity restrictions were reviewed with the patient and daughter at bedside Routine post implant follow up is in place  2. SAH/TBI     per primary team. Being managed with supportive care at present time.  3. Moderate-severe aortic stenosis       will need surveillance of symptoms  as outpatient.  4. Episodes of hypoxia      Pleural effusion on cxr     Will defer ongoing management to PCCM and IM   EP service will sign off though remains available OK to transfer back to neuro floor    For questions or updates, please contact [facility] Please consult [email or web address] for contact info under Cardiology/STEMI.  Signed, Sheilah Pigeon, PA-C 02/11/2019, 9:13 AM    EP Attending  Patient seen and examined. Agree with the findings as noted above. The patient is s/p PPM insertion and appears to be doing well. Her CXR demonstrates no PTX and stable lead position. She will be transferred back to the neuro floor and EP service will sign off. She will be scheduled for followup as noted above.   Leonia Reeves.D.

## 2019-02-11 NOTE — Progress Notes (Signed)
Neuro check this morning revealed non reactive pupils.  4 [place] nurse, Dr. Sharon Seller notified.

## 2019-02-11 NOTE — Progress Notes (Signed)
CBG 420- paged Kirby,NP; orders given.

## 2019-02-11 NOTE — Progress Notes (Addendum)
Modified Barium Swallow Progress Note  Patient Details  Name: Danielle Clarke MRN: [id number] Date of Birth: Aug 19, 1941  Today's Date: 02/11/2019  Modified Barium Swallow completed.  Full report located under Chart Review in the Imaging Section.  Brief recommendations include the following:  Clinical Impression  Pt exhibits a mild-moderate oropharyngeal dysphagia marked by laryngeal penetration with thin and nectar. Hardware present at entire cervical vertebrae (pt cannot recall year of surgery; xray technician stated if appeared "old").Orally, she demonstrated delays in transit and bolus cohesion and fluidity of transit to pharynx. Pt unable to transit solid bolus into hypopharynx without thin liquid to assist accompanied by episodes of gagging. Barium entered nasal cavity for several seconds x 1 before exiting and swallow initiated. Epiglottic inversion was incomplete without retroflexion and mistiming of laryngeal closure allowing thin and nectar thick barium to reach just above the vocal cords. She was sensate to majority of penetrates, clearing throat reflexively to inconsistently clear penetrates. Chin tuck was ineffective to eliminate or decrease penetration. Decreased epiglottic inversion and contraction led to mild vallecular and pyriform sinus residue. Given effort required for regular texture solids to transit pharynx recommend initiation of full liquid diet, no straws, small sips, multiple swallows, throat clears throughout meals and crush pills. ST will continue.        Swallow Evaluation Recommendations       SLP Diet Recommendations: Thin liquid;Other (Comment)(full liquids)   Liquid Administration via: Cup;No straw   Medication Administration: Crushed with puree   Supervision: Patient able to self feed;Full supervision/cueing for compensatory strategies   Compensations: Small sips/bites;Slow rate;Multiple dry swallows after each bite/sip;Clear throat intermittently    Postural Changes: Seated upright at 90 degrees   Oral Care Recommendations: Oral care BID        Danielle Clarke 02/11/2019,11:25 AM   Danielle Clarke Nurse, children's [phone] Office [phone]

## 2019-02-11 NOTE — Progress Notes (Signed)
Physical Therapy Treatment Patient Details Name: Danielle Clarke MRN: [id number] DOB: 1940/12/09 Today's Date: 02/11/2019    History of Present Illness 78 yo with syncope at dentist office with Left frontal Intraparenchymal hematoma with bil R>L temporal contusions. s/p pacer placement 3/17. PMHx: HTN, DM, glaucoma, HLD, RT TSA    PT Comments    Patient not progressing with PT today secondary to dizziness and feeling like she was going to pass out. Tolerated sitting EOB ~ 5 mins and standing with Min A. Limited standing tolerance to above. Pt with difficulty describing sensation secondary to aphasia. Slow to process and respond to all questions and requires frequent cueing to perform/complete tasks. RN notified.  VSS stable throughout session with Sp02 >93% on RA, Supine BP 131/65, sitting BP 143/72. Will continue to follow and progress as tolerated.    Follow Up Recommendations  CIR;Supervision/Assistance - 24 hour     Equipment Recommendations  Rolling walker with 5" wheels    Recommendations for Other Services       Precautions / Restrictions Precautions Precautions: Fall;ICD/Pacemaker Precaution Comments: appears to have either L sided neglect or vision impairment, pt put on glasses and appeared to have improved visually Restrictions Weight Bearing Restrictions: Yes LUE Weight Bearing: Non weight bearing    Mobility  Bed Mobility Overal bed mobility: Needs Assistance Bed Mobility: Rolling;Sidelying to Sit Rolling: Min assist Sidelying to sit: Min assist;HOB elevated Supine to sit: Min guard;HOB elevated     General bed mobility comments: Step by step cues to get to EOB, assist with trunk. Use of rail. Cues to refrain from using LUE. Able to bring LEs in bed to return to supine.  Transfers Overall transfer level: Needs assistance Equipment used: Rolling walker (2 wheeled) Transfers: Sit to/from Stand Sit to Stand: Min assist         General transfer comment:  Assist to power to standing but quickly returned to sitting position due to dizziness, eyes rolling around and feeling like she was going to ass out. VSS.   Ambulation/Gait                 Stairs             Wheelchair Mobility    Modified Rankin (Stroke Patients Only) Modified Rankin (Stroke Patients Only) Pre-Morbid Rankin Score: No symptoms Modified Rankin: Moderately severe disability     Balance Overall balance assessment: Needs assistance Sitting-balance support: Bilateral upper extremity supported;Feet supported Sitting balance-Leahy Scale: Poor Sitting balance - Comments: requires Bue support in sitting. Tolerated EOB ~5 mins before needing to lay down secondary to feeling dizzy, like she was going to pass out. VSS.    Standing balance support: Bilateral upper extremity supported Standing balance-Leahy Scale: Poor Standing balance comment: pt requires bil UE support, limited tolerance today                            Cognition Arousal/Alertness: Lethargic Behavior During Therapy: Flat affect Overall Cognitive Status: Impaired/Different from baseline                   Orientation Level: Disoriented to;Situation Current Attention Level: Focused Memory: Decreased recall of precautions;Decreased short-term memory Following Commands: Follows one step commands inconsistently;Follows one step commands with increased time     Problem Solving: Slow processing;Requires verbal cues General Comments: Oriented to place with increased time and date. Increased time and frequent cues to complete task.  Exercises      General Comments General comments (skin integrity, edema, etc.): Daughter present during session. Sp02 >93% on RA. Supine BP 131/65, sitting BP 143/72.      Pertinent Vitals/Pain Pain Assessment: Faces Faces Pain Scale: No hurt Pain Intervention(s): Monitored during session    Home Living                       Prior Function            PT Goals (current goals can now be found in the care plan section) Progress towards PT goals: Not progressing toward goals - comment(secondary to dizziness)    Frequency    Min 4X/week      PT Plan Current plan remains appropriate    Co-evaluation              AM-PAC PT "6 Clicks" Mobility   Outcome Measure  Help needed turning from your back to your side while in a flat bed without using bedrails?: A Little Help needed moving from lying on your back to sitting on the side of a flat bed without using bedrails?: A Little Help needed moving to and from a bed to a chair (including a wheelchair)?: A Little Help needed standing up from a chair using your arms (e.g., wheelchair or bedside chair)?: A Little Help needed to walk in hospital room?: A Little Help needed climbing 3-5 steps with a railing? : A Lot 6 Click Score: 17    End of Session Equipment Utilized During Treatment: Gait belt Activity Tolerance: Other (comment)(feeling dizzy) Patient left: in bed;with call bell/phone within reach;with bed alarm set;with family/visitor present Nurse Communication: Mobility status;Other (comment)(dizziness EOB) PT Visit Diagnosis: Unsteadiness on feet (R26.81);Other symptoms and signs involving the nervous system (R29.898)     Time: 9518-8416 PT Time Calculation (min) (ACUTE ONLY): 22 min  Charges:  $Therapeutic Activity: 8-22 mins                     Mylo Red, PT, DPT Acute Rehabilitation Services Pager [phone] Office [phone]       Blake Divine A Lanier Ensign 02/11/2019, 2:13 PM

## 2019-02-12 DIAGNOSIS — I10 Essential (primary) hypertension: Secondary | ICD-10-CM

## 2019-02-12 DIAGNOSIS — I350 Nonrheumatic aortic (valve) stenosis: Secondary | ICD-10-CM

## 2019-02-12 DIAGNOSIS — E785 Hyperlipidemia, unspecified: Secondary | ICD-10-CM

## 2019-02-12 DIAGNOSIS — R7309 Other abnormal glucose: Secondary | ICD-10-CM

## 2019-02-12 DIAGNOSIS — E119 Type 2 diabetes mellitus without complications: Secondary | ICD-10-CM

## 2019-02-12 DIAGNOSIS — Z950 Presence of cardiac pacemaker: Secondary | ICD-10-CM

## 2019-02-12 DIAGNOSIS — E870 Hyperosmolality and hypernatremia: Secondary | ICD-10-CM

## 2019-02-12 DIAGNOSIS — S06350A Traumatic hemorrhage of left cerebrum without loss of consciousness, initial encounter: Secondary | ICD-10-CM

## 2019-02-12 DIAGNOSIS — D638 Anemia in other chronic diseases classified elsewhere: Secondary | ICD-10-CM

## 2019-02-12 LAB — CBC
HCT: 37.6 % (ref 36.0–46.0)
Hemoglobin: 11.8 g/dL — ABNORMAL LOW (ref 12.0–15.0)
MCH: 29.2 pg (ref 26.0–34.0)
MCHC: 31.4 g/dL (ref 30.0–36.0)
MCV: 93.1 fL (ref 80.0–100.0)
Platelets: 242 10*3/uL (ref 150–400)
RBC: 4.04 MIL/uL (ref 3.87–5.11)
RDW: 14.4 % (ref 11.5–15.5)
WBC: 7.8 10*3/uL (ref 4.0–10.5)
nRBC: 0 % (ref 0.0–0.2)

## 2019-02-12 LAB — COMPREHENSIVE METABOLIC PANEL
ALT: 17 U/L (ref 0–44)
AST: 15 U/L (ref 15–41)
Albumin: 2.8 g/dL — ABNORMAL LOW (ref 3.5–5.0)
Alkaline Phosphatase: 58 U/L (ref 38–126)
Anion gap: 8 (ref 5–15)
BUN: 31 mg/dL — ABNORMAL HIGH (ref 8–23)
CO2: 37 mmol/L — ABNORMAL HIGH (ref 22–32)
Calcium: 8.9 mg/dL (ref 8.9–10.3)
Chloride: 113 mmol/L — ABNORMAL HIGH (ref 98–111)
Creatinine, Ser: 0.73 mg/dL (ref 0.44–1.00)
GFR calc Af Amer: >60 mL/min (ref >60)
GFR calc non Af Amer: >60 mL/min (ref >60)
Glucose, Bld: 75 mg/dL (ref 70–99)
Potassium: 3.5 mmol/L (ref 3.5–5.1)
Sodium: 158 mmol/L — ABNORMAL HIGH (ref 135–145)
Total Bilirubin: 0.6 mg/dL (ref 0.3–1.2)
Total Protein: 5.9 g/dL — ABNORMAL LOW (ref 6.5–8.1)

## 2019-02-12 LAB — GLUCOSE, CAPILLARY
Glucose-Capillary: 79 mg/dL (ref 70–99)
Glucose-Capillary: 197 mg/dL — ABNORMAL HIGH (ref 70–99)
Glucose-Capillary: 392 mg/dL — ABNORMAL HIGH (ref 70–99)
Glucose-Capillary: 272 mg/dL — ABNORMAL HIGH (ref 70–99)
Glucose-Capillary: 199 mg/dL — ABNORMAL HIGH (ref 70–99)

## 2019-02-12 LAB — VITAMIN B12: Vitamin B-12: 2855 pg/mL — ABNORMAL HIGH (ref 180–914)

## 2019-02-12 LAB — IRON AND TIBC
Iron: 46 ug/dL (ref 28–170)
Saturation Ratios: 19 % (ref 10.4–31.8)
TIBC: 244 ug/dL — ABNORMAL LOW (ref 250–450)
UIBC: 198 ug/dL

## 2019-02-12 LAB — RETICULOCYTES
Immature Retic Fract: 7.8 % (ref 2.3–15.9)
RBC.: 4.04 MIL/uL (ref 3.87–5.11)
Retic Count, Absolute: 50.1 10*3/uL (ref 19.0–186.0)
Retic Ct Pct: 1.2 % (ref 0.4–3.1)

## 2019-02-12 LAB — FERRITIN: Ferritin: 87 ng/mL (ref 11–307)

## 2019-02-12 LAB — FOLATE: Folate: 27.4 ng/mL (ref >5.9)

## 2019-02-12 MED ORDER — DEXTROSE 5 % IV SOLN
INTRAVENOUS | Status: DC
  Administered 2019-02-12 – 2019-02-14 (×4): via INTRAVENOUS

## 2019-02-12 MED ORDER — FREE WATER
350.0000 mL | Freq: Four times a day (QID) | Status: DC
  Administered 2019-02-12: 350 mL

## 2019-02-12 MED ORDER — INSULIN GLARGINE 100 UNIT/ML ~~LOC~~ SOLN
20.0000 [IU] | Freq: Every day | SUBCUTANEOUS | Status: DC
  Administered 2019-02-12 – 2019-02-13 (×2): 20 [IU] via SUBCUTANEOUS
  Filled 2019-02-12 (×2): qty 0.2

## 2019-02-12 MED ORDER — INSULIN GLARGINE 100 UNIT/ML ~~LOC~~ SOLN: 20.0000 [IU] | Freq: Every day | SUBCUTANEOUS | Status: DC

## 2019-02-12 MED ORDER — GLUCERNA SHAKE PO LIQD
237.0000 mL | Freq: Three times a day (TID) | ORAL | Status: DC
  Administered 2019-02-12 – 2019-02-14 (×3): 237 mL via ORAL

## 2019-02-12 NOTE — Progress Notes (Signed)
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 s/p pacer placement 3/17. PMHx: HTN, DM, glaucoma, HLD, RT TSA   OT comments  Pt presents supine in bed and agreeable to treatment session. Pt continues to present with increased weakness and fatigue, impaired cognition. Pt requiring maxA (+2) for completion of toileting task this session, able to perform functional mobility using RW with minA (+2 safety) within room and short distance into hallway. Pt requiring seated rest breaks throughout, increased processing time noted and delayed initiation with functional tasks. Feel CIR recommendation remains appropriate at this time. Will continue to follow acutely to progress pt towards established OT goals.    Follow Up Recommendations  CIR;Supervision/Assistance - 24 hour    Equipment Recommendations  Other (comment)(defer to next venue)          Precautions / Restrictions Precautions Precautions: Fall;ICD/Pacemaker Precaution Comments: NG tube       Mobility Bed Mobility Overal bed mobility: Needs Assistance Bed Mobility: Supine to Sit     Supine to sit: Mod assist     General bed mobility comments: cues to initiate moving LEs over EOB, assist to elevate trunk, required assist to scoot hips towards EOB as pt too fatigued to do so   Transfers Overall transfer level: Needs assistance Equipment used: Rolling walker (2 wheeled) Transfers: Sit to/from Stand Sit to Stand: Min assist;Mod assist;+2 physical assistance;+2 safety/equipment         General transfer comment: VCs for safe hand placement, assist to rise and steady at RW    Balance Overall balance assessment: Needs assistance Sitting-balance support: Bilateral upper extremity supported;Feet  supported Sitting balance-Leahy Scale: Fair     Standing balance support: Bilateral upper extremity supported;During functional activity Standing balance-Leahy Scale: Poor Standing balance comment: pt requires bil UE support and external assist                           ADL either performed or assessed with clinical judgement   ADL Overall ADL's : Needs assistance/impaired                     Lower Body Dressing: Maximal assistance;+2 for safety/equipment;Sit to/from stand Lower Body Dressing Details (indicate cue type and reason): pt able to bring LLE into figure 4 with assist provided to maintain position to don sock, pt able to thread sock partially onto L foot, required assist to fully bring over heel (fatigued with attemtps) Toilet Transfer: Minimal assistance;Moderate assistance;+2 for safety/equipment;RW;BSC;Stand-pivot   Toileting- Clothing Manipulation and Hygiene: Maximal assistance;+2 for physical assistance;+2 for safety/equipment;Sit to/from stand Toileting - Clothing Manipulation Details (indicate cue type and reason): pt noted to have incontinent BM upon standing from EOB, transferred to [facility] for further toileting, required assist for gown management and peri-care     Functional mobility during ADLs: Minimal assistance;Moderate assistance;Rolling walker;+2 for safety/equipment General ADL Comments: pt with increased fatigue, weakness, cognitive impairments     Vision       Perception     Praxis      Cognition Arousal/Alertness: Awake/alert Behavior During Therapy: Flat affect Overall Cognitive Status: Impaired/Different from baseline                   Orientation Level: Disoriented to;Time Current Attention Level:  Focused Memory: Decreased recall of precautions;Decreased short-term memory Following Commands: Follows one step commands inconsistently;Follows one step commands with increased time   Awareness: Intellectual Problem  Solving: Slow processing;Decreased initiation;Difficulty sequencing;Requires verbal cues General Comments: pt not verbalizing much during session, slowed responses and requires cues to initiate tasks, requires encouragement        Exercises     Shoulder Instructions       General Comments VSS during session with pt on RA, pt daughter present and with questions re: pt current status and plans for potential rehab     Pertinent Vitals/ Pain       Pain Assessment: Faces Faces Pain Scale: Hurts little more Pain Location: Generalized Pain Descriptors / Indicators: Grimacing;Moaning Pain Intervention(s): Monitored during session;Limited activity within patient's tolerance;Repositioned  Home Living                                          Prior Functioning/Environment              Frequency  Min 3X/week        Progress Toward Goals  OT Goals(current goals can now be found in the care plan section)  Progress towards OT goals: Progressing toward goals  Acute Rehab OT Goals Patient Stated Goal: Unstated; Daughter reports wanting to go to rehab and get stronger OT Goal Formulation: With patient/family Time For Goal Achievement: 02/22/19 Potential to Achieve Goals: Good ADL Goals Pt Will Perform Grooming: with min guard assist;standing Pt Will Perform Upper Body Dressing: with set-up;with supervision;sitting Pt Will Perform Lower Body Dressing: with min guard assist;sit to/from stand Pt Will Transfer to Toilet: with min guard assist;ambulating;bedside commode Pt Will Perform Toileting - Clothing Manipulation and hygiene: with min guard assist;sit to/from stand Additional ADL Goal #1: Pt will demonstrate sustain attention during BADL with 2-3 cues  Plan Discharge plan remains appropriate    Co-evaluation    PT/OT/SLP Co-Evaluation/Treatment: Yes Reason for Co-Treatment: For patient/therapist safety;To address functional/ADL transfers   OT goals  addressed during session: Proper use of Adaptive equipment and DME;ADL's and self-care      AM-PAC OT "6 Clicks" Daily Activity     Outcome Measure   Help from another person eating meals?: Total Help from another person taking care of personal grooming?: A Lot Help from another person toileting, which includes using toliet, bedpan, or urinal?: A Lot Help from another person bathing (including washing, rinsing, drying)?: A Lot Help from another person to put on and taking off regular upper body clothing?: A Lot Help from another person to put on and taking off regular lower body clothing?: A Lot 6 Click Score: 11    End of Session Equipment Utilized During Treatment: Gait belt;Rolling walker  OT Visit Diagnosis: Unsteadiness on feet (R26.81);Other abnormalities of gait and mobility (R26.89);Muscle weakness (generalized) (M62.81);Pain Pain - part of body: (generalized)   Activity Tolerance Patient tolerated treatment well;Patient limited by fatigue   Patient Left in chair;with call bell/phone within reach;with family/visitor present   Nurse Communication Mobility status        Time: 3007-6226 OT Time Calculation (min): 45 min  Charges: OT General Charges $OT Visit: 1 Visit OT Treatments $Self Care/Home Management : 23-37 mins  Marcy Siren, OT Supplemental Rehabilitation Services Pager [phone] Office [phone]    Orlando Penner 02/12/2019, 5:18 PM

## 2019-02-12 NOTE — Progress Notes (Signed)
Upon entering pts room to administer insulin it was noted that pt had pulled the cortrak out. This nurse took the nose  cradle off for pt. Provider on call notified of this and of pace maker misfires as reported by tele. EKG was ordered and done. Will continue to monitor. Mayford Knife RN

## 2019-02-12 NOTE — Progress Notes (Signed)
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 s/p pacer placement 3/17. PMHx: HTN, DM, glaucoma, HLD, RT TSA    PT Comments    Even tired and somewhat fatiqued, pt agreed to OOB activity.  Empahsis on transitions, transfers and gait stability and endurance.   Follow Up Recommendations  CIR;Supervision/Assistance - 24 hour     Equipment Recommendations  Rolling walker with 5" wheels    Recommendations for Other Services       Precautions / Restrictions Precautions Precautions: Fall;ICD/Pacemaker Precaution Comments: NG tube    Mobility  Bed Mobility Overal bed mobility: Needs Assistance Bed Mobility: Supine to Sit     Supine to sit: Mod assist     General bed mobility comments: cues to initiate moving LEs over EOB, assist to elevate trunk, required assist to scoot hips towards EOB as pt too fatigued to do so   Transfers Overall transfer level: Needs assistance Equipment used: Rolling walker (2 wheeled) Transfers: Sit to/from Stand Sit to Stand: Min assist;Mod assist;+2 physical assistance;+2 safety/equipment         General transfer comment: VCs for safe hand placement, assist to rise and steady at RW  Ambulation/Gait Ambulation/Gait assistance: Min assist;+2 safety/equipment;+2 physical assistance Gait Distance (Feet): 45 Feet Assistive device: Rolling walker (2 wheeled) Gait Pattern/deviations: Step-through pattern;Decreased step length - right;Decreased step length - left;Decreased stride length Gait velocity: decreased   General Gait Details: weak-kneed overall, needed assist maneuvering the RW, occassional stability assist as she fatigued   Stairs             Wheelchair Mobility    Modified Rankin (Stroke Patients  Only) Modified Rankin (Stroke Patients Only) Pre-Morbid Rankin Score: No symptoms Modified Rankin: Moderately severe disability     Balance Overall balance assessment: Needs assistance Sitting-balance support: Bilateral upper extremity supported;Feet supported Sitting balance-Leahy Scale: Fair     Standing balance support: Bilateral upper extremity supported;During functional activity Standing balance-Leahy Scale: Poor Standing balance comment: pt requires bil UE support and external assist                            Cognition Arousal/Alertness: Awake/alert Behavior During Therapy: Flat affect Overall Cognitive Status: Impaired/Different from baseline                   Orientation Level: Disoriented to;Time Current Attention Level: Focused Memory: Decreased recall of precautions;Decreased short-term memory Following Commands: Follows one step commands inconsistently;Follows one step commands with increased time   Awareness: Intellectual Problem Solving: Slow processing;Decreased initiation;Difficulty sequencing;Requires verbal cues General Comments: pt not verbalizing much during session, slowed responses and requires cues to initiate tasks, requires encouragement      Exercises      General Comments General comments (skin integrity, edema, etc.): VSS during session with pt on RA, pt daughter present and with questions re: pt current status and plans for potential rehab       Pertinent Vitals/Pain Pain Assessment: Faces Faces Pain Scale: Hurts little more Pain Location: Generalized Pain Descriptors / Indicators: Grimacing;Moaning Pain Intervention(s): Monitored during session    Home Living                      Prior Function  PT Goals (current goals can now be found in the care plan section) Acute Rehab PT Goals Patient Stated Goal: Unstated; Daughter reports wanting to go to rehab and get stronger PT Goal Formulation:  Patient unable to participate in goal setting Progress towards PT goals: Progressing toward goals    Frequency    Min 4X/week      PT Plan Current plan remains appropriate    Co-evaluation PT/OT/SLP Co-Evaluation/Treatment: Yes Reason for Co-Treatment: Complexity of the patient's impairments (multi-system involvement) PT goals addressed during session: Mobility/safety with mobility OT goals addressed during session: Proper use of Adaptive equipment and DME;ADL's and self-care      AM-PAC PT "6 Clicks" Mobility   Outcome Measure  Help needed turning from your back to your side while in a flat bed without using bedrails?: A Little Help needed moving from lying on your back to sitting on the side of a flat bed without using bedrails?: A Lot Help needed moving to and from a bed to a chair (including a wheelchair)?: A Little Help needed standing up from a chair using your arms (e.g., wheelchair or bedside chair)?: A Little Help needed to walk in hospital room?: A Little Help needed climbing 3-5 steps with a railing? : A Lot 6 Click Score: 16    End of Session   Activity Tolerance: Patient tolerated treatment well;Patient limited by fatigue Patient left: in chair;with call bell/phone within reach;with chair alarm set Nurse Communication: Mobility status PT Visit Diagnosis: Unsteadiness on feet (R26.81);Difficulty in walking, not elsewhere classified (R26.2)     Time: 0932-6712 PT Time Calculation (min) (ACUTE ONLY): 45 min  Charges:  $Gait Training: 8-22 mins                     02/12/2019   Bing, PT Acute Rehabilitation Services [phone]  (pager) [phone]  (office)   Eliseo Gum Montrae Braithwaite 02/12/2019, 6:45 PM

## 2019-02-12 NOTE — Consult Note (Signed)
Physical Medicine and Rehabilitation Consult Reason for Consult:  Decreased functional mobility Referring Physician: Triad   HPI: Danielle Clarke is a 78 y.o.right handed female with history of diabetes mellitus, hyperlipidemia, hypertension,mild aortic stenosis, carotid artery disease, anterior cervical corpectomy, recurrent syncopal episodes followed by cardiology services.. Per chart review and daughter, patient lives alone. One level home 4 steps to entry. Performs all ADLs including driving uses a cane at times. Family in the area works. Presented 02/07/2019 after syncopal episode while at the dental office. Daughter reports she did strike her head. No seizure activity noted. Blood sugars greater than 200. Cranial CT/MRI showed left frontal intraparenchymal hematoma with volume approximate 10 mL. Posterior right parietal scalp hematoma consistent with contrecoup pattern of injury. No acute fracture. No underlying mass lesion. There was a 4 mm left to right midline shift. No hydrocephalus. Neurosurgery Dr. Conchita Paris advise conservative care. Noted blood pressure elevated maintained on nifedipine drip as well as heart rate in the 100s. Echocardiogram with ejection fraction of 65% normal systolic function. EP following for multiple episodes of intermittent high-grade heart block noted on telemetry. Patient later underwent permanent pacemaker placement 02/11/2019 per Dr. Gilman Schmidt. Currently on a full liquid diet wwith nasogastric tube in place. Therapy evaluations completed with recommendations of physical medicine rehabilitation consult.   Review of Systems  Unable to perform ROS: Mental acuity   Past Medical History:  Diagnosis Date   Aortic stenosis, mild    Arthritis    Asthmatic bronchitis    with colds per patient   Carotid artery disease (HCC)    40-59% bilateral ICA stenosis   Cataract    Cutaneous abscess of right foot    Glaucoma    Heart murmur    Dr Katrinka Blazing  is her  cardiologist.    Hyperlipemia    Hypertension    Dr. Katrinka Blazing ~ 2 years ago   Neck fracture [facility])    july 2013   Osteoporosis    Syncope    Type 1 diabetes mellitus [facility])    Past Surgical History:  Procedure Laterality Date   ANKLE FRACTURE SURGERY Left 2001   steel plate and 3 screws    ANTERIOR CERVICAL CORPECTOMY N/A 07/31/2014   Procedure: Cervical Four to Cervical Six Corpectomy;  Surgeon: Karn Cassis, MD;  Location: [facility];  Service: Neurosurgery;  Laterality: N/A;  C4 to C6 Corpectomy   BREAST SURGERY  1988   CATARACT EXTRACTION W/ INTRAOCULAR LENS  IMPLANT, BILATERAL Bilateral 2011   right and then left   EYE SURGERY     FRACTURE SURGERY     HAMMER TOE SURGERY  1998   I&D EXTREMITY Right 09/27/2018   Procedure: IRRIGATION AND DEBRIDEMENT RIGHT FOOT;  Surgeon: Nadara Mustard, MD;  Location: [facility] OR;  Service: Orthopedics;  Laterality: Right;   JOINT REPLACEMENT     PACEMAKER IMPLANT N/A 02/10/2019   Procedure: PACEMAKER IMPLANT;  Surgeon: Marinus Maw, MD;  [facility]: [facility];  Service: Cardiovascular;  Laterality: N/A;   TONSILLECTOMY AND ADENOIDECTOMY  1948   TOTAL SHOULDER REPLACEMENT  2010   right shoulder    TUBAL LIGATION  1979   TYMPANOSTOMY TUBE PLACEMENT Bilateral    Family History  Problem Relation Age of Onset   Cancer Mother    Heart Problems Father    Social History:  reports that she has never smoked. She has never used smokeless tobacco. She reports current alcohol use of  about 7.0 standard drinks of alcohol per week. No history on file for drug. Allergies:  Allergies  Allergen Reactions   Cefaclor Anaphylaxis   Tetracycline Anaphylaxis   Vancomycin Anaphylaxis   Augmentin [Amoxicillin-Pot Clavulanate] Other (See Comments)    Reaction unknown >> Anaphylaxis Has patient had a PCN reaction causing immediate rash, facial/tongue/throat swelling, SOB or lightheadedness with hypotension: Unknown Has  patient had a PCN reaction causing severe rash involving mucus membranes or skin necrosis: Unknown Has patient had a PCN reaction that required hospitalization: Unknown Has patient had a PCN reaction occurring within the last 10 years: Unknown If all of the above answers are "NO", then may proceed with Cephalosporin use.    Prednisone Other (See Comments)    Reaction unknown   Medications Prior to Admission  Medication Sig Dispense Refill   acetaminophen (TYLENOL) 500 MG tablet Take 500 mg by mouth 4 (four) times daily.      aspirin EC 81 MG tablet Take 81 mg by mouth daily.     Chromium Picolinate 1000 MCG TABS Take 1,000 mcg by mouth daily at 12 noon.      CINNAMON PO Take 1,000 mg by mouth 4 (four) times daily.     HUMALOG KWIKPEN 100 UNIT/ML KiwkPen Inject 2-13 Units into the skin 4 (four) times daily. Sliding Scale Insulin  3   Multiple Vitamin (MULTIVITAMIN WITH MINERALS) TABS tablet Take 1 tablet by mouth daily.     Probiotic Product (PROBIOTIC PO) Take 1 tablet by mouth daily.     simvastatin (ZOCOR) 40 MG tablet Take 40 mg by mouth daily at 8 pm.      traMADol (ULTRAM) 50 MG tablet Take 1 tablet (50 mg total) by mouth every 6 (six) hours as needed for moderate pain. 30 tablet 0   TRESIBA 100 UNIT/ML SOLN Inject 10 Units into the skin daily.   6   Turmeric 500 MG CAPS Take 500 mg by mouth daily at 12 noon.      albuterol (PROVENTIL HFA;VENTOLIN HFA) 108 (90 Base) MCG/ACT inhaler Inhale 2 puffs into the lungs every 6 (six) hours as needed for wheezing or shortness of breath. (Patient not taking: Reported on 02/06/2019) 1 Inhaler 2   clindamycin (CLEOCIN) 300 MG capsule Take 1 capsule (300 mg total) by mouth 3 (three) times daily. (Patient not taking: Reported on 02/06/2019) 42 capsule 0   mupirocin ointment (BACTROBAN) 2 % Apply 1 application topically daily. Apply to right foot incision daily (Patient not taking: Reported on 02/06/2019) 22 g 0   ondansetron (ZOFRAN ODT) 4  MG disintegrating tablet Take 1 tablet (4 mg total) by mouth every 8 (eight) hours as needed for nausea or vomiting. (Patient not taking: Reported on 02/06/2019) 20 tablet 0   ondansetron (ZOFRAN) 4 MG tablet Take 1 tablet (4 mg total) by mouth every 8 (eight) hours as needed for nausea or vomiting. (Patient not taking: Reported on 02/06/2019) 20 tablet 0   sulfamethoxazole-trimethoprim (BACTRIM DS,SEPTRA DS) 800-160 MG tablet Take 1 tablet by mouth 2 (two) times daily. (Patient not taking: Reported on 02/06/2019) 6 tablet 0   sulfamethoxazole-trimethoprim (BACTRIM DS,SEPTRA DS) 800-160 MG tablet Take 1 tablet by mouth 2 (two) times daily. (Patient not taking: Reported on 02/06/2019) 40 tablet 0   triamcinolone (KENALOG) 0.025 % ointment Apply 1 application topically 2 (two) times daily. (Patient not taking: Reported on 02/06/2019) 30 g 0    Home: Home Living Family/patient expects to be discharged to:: Private residence Living Arrangements:  Alone Available Help at Discharge: Family, Available PRN/intermittently Type of Home: House Home Access: Stairs to enter Entrance Stairs-Number of Steps: 2 Entrance Stairs-Rails: Right(daughter reports can easily get handrails and/or ramp built) Home Layout: One level Bathroom Shower/Tub: Engineer, manufacturing systems: Handicapped height Home Equipment: Environmental consultant - 4 wheels, Bedside commode, Cane - quad, Shower seat Additional Comments: daughter is flight attendant with flexible schedule, is working to try and take leave of absence for next month, son and daughter in Hospital doctor in [place] and able to help as well  Lives With: Alone  Functional History: Prior Function Level of Independence: Independent Comments: Performs all ADLs and IADLs including driving. Uses a cane as she wants Functional Status:  Mobility: Bed Mobility Overal bed mobility: Needs Assistance Bed Mobility: Rolling, Sidelying to Sit Rolling: Min assist Sidelying to sit: Min assist,  HOB elevated Supine to sit: Min guard, HOB elevated General bed mobility comments: Step by step cues to get to EOB, assist with trunk. Use of rail. Cues to refrain from using LUE. Able to bring LEs in bed to return to supine. Transfers Overall transfer level: Needs assistance Equipment used: Rolling walker (2 wheeled) Transfers: Sit to/from Stand Sit to Stand: Min assist General transfer comment: Assist to power to standing but quickly returned to sitting position due to dizziness, eyes rolling around and feeling like she was going to ass out. VSS.  Ambulation/Gait Ambulation/Gait assistance: Min assist, +2 safety/equipment Gait Distance (Feet): 150 Feet Assistive device: Rolling walker (2 wheeled) Gait Pattern/deviations: Step-through pattern, Decreased stride length, Trunk flexed General Gait Details: 2nd person for line management, pt on 5LO2 via [place], SPO2 >93%, mild SOB, pt with trunk flexion and mild SOB, 3 standing rest breaks, deferred ambulating further due to fatigue. Once patient with glasses pt able to navigate hallways with simple cues and minimal running into objects Gait velocity: decreased    ADL: ADL Overall ADL's : Needs assistance/impaired Eating/Feeding: NPO Grooming: Wash/dry face, Maximal assistance, Sitting Grooming Details (indicate cue type and reason): Max A due to poor following of cues Upper Body Bathing: Moderate assistance, Sitting Lower Body Bathing: Maximal assistance, Sit to/from stand Upper Body Dressing : Moderate assistance, Sitting Lower Body Dressing: Maximal assistance, Sit to/from stand Lower Body Dressing Details (indicate cue type and reason): Max A for donning socks due to poor cognition and balance Toilet Transfer: Minimal assistance, +2 for physical assistance, +2 for safety/equipment, Ambulation, RW(Simulated to recliner) Functional mobility during ADLs: Supervision/safety General ADL Comments: Pt presenting with poor cognition, balance,  strength, and functional performance  Cognition: Cognition Overall Cognitive Status: Impaired/Different from baseline Arousal/Alertness: Lethargic Orientation Level: Oriented to person, Oriented to place, Oriented to situation(reported sometimes oriented to time) Attention: Sustained Sustained Attention: Impaired Sustained Attention Impairment: Verbal basic, Functional basic Memory: (TBA) Awareness: Impaired Awareness Impairment: Emergent impairment, Anticipatory impairment Problem Solving: Impaired Problem Solving Impairment: Functional basic Behaviors: Restless, Impulsive Safety/Judgment: Impaired Cognition Arousal/Alertness: Lethargic Behavior During Therapy: Flat affect Overall Cognitive Status: Impaired/Different from baseline Area of Impairment: Orientation, Problem solving, Attention, Memory, Following commands, Safety/judgement, Awareness Orientation Level: Disoriented to, Situation Current Attention Level: Focused Memory: Decreased recall of precautions, Decreased short-term memory Following Commands: Follows one step commands inconsistently, Follows one step commands with increased time Safety/Judgement: Decreased awareness of deficits, Decreased awareness of safety(possible L sided neglect) Awareness: Intellectual Problem Solving: Slow processing, Requires verbal cues General Comments: Oriented to place with increased time and date. Increased time and frequent cues to complete task.   Blood pressure Marland Kitchen)  166/70, pulse 98, temperature 98.3 F (36.8 C), temperature source Oral, resp. rate 19, height 5' (1.524 m), weight 49.5 kg, SpO2 100 %. Physical Exam  Constitutional: She appears well-developed and well-nourished.  HENT:  Right Ear: External ear normal.  Left Ear: External ear normal.  + NG  Eyes: EOM are normal. Right eye exhibits no discharge. Left eye exhibits no discharge.  Neck: Normal range of motion. Neck supple.  Cardiovascular: Normal rate and regular  rhythm.  Respiratory: Effort normal and breath sounds normal.  GI: Soft. Bowel sounds are normal.  Musculoskeletal:     Comments: No edema or tenderness in extremities  Neurological: She is alert.  Oriented x2 Follows commands with some cueing.  Motor: 4/5 grossly throughout  Skin: Skin is warm and dry.  Psychiatric: Her affect is blunt. Her speech is delayed. She is slowed. Cognition and memory are impaired.    Results for orders placed or performed during the hospital encounter of 02/06/19 (from the past 24 hour(s))  Glucose, capillary     Status: Abnormal   Collection Time: 02/11/19  3:40 PM  Result Value Ref Range   Glucose-Capillary 289 (H) 70 - 99 mg/dL  Glucose, capillary     Status: Abnormal   Collection Time: 02/11/19  5:53 PM  Result Value Ref Range   Glucose-Capillary 333 (H) 70 - 99 mg/dL  Glucose, capillary     Status: Abnormal   Collection Time: 02/11/19  8:16 PM  Result Value Ref Range   Glucose-Capillary 420 (H) 70 - 99 mg/dL  Glucose, capillary     Status: Abnormal   Collection Time: 02/11/19 11:24 PM  Result Value Ref Range   Glucose-Capillary 228 (H) 70 - 99 mg/dL  Vitamin Z61     Status: Abnormal   Collection Time: 02/12/19  3:58 AM  Result Value Ref Range   Vitamin B-12 2,855 (H) 180 - 914 pg/mL  Folate     Status: None   Collection Time: 02/12/19  3:58 AM  Result Value Ref Range   Folate 27.4 >5.9 ng/mL  Iron and TIBC     Status: Abnormal   Collection Time: 02/12/19  3:58 AM  Result Value Ref Range   Iron 46 28 - 170 ug/dL   TIBC 096 (L) 045 - 409 ug/dL   Saturation Ratios 19 10.4 - 31.8 %   UIBC 198 ug/dL  Ferritin     Status: None   Collection Time: 02/12/19  3:58 AM  Result Value Ref Range   Ferritin 87 11 - 307 ng/mL  Reticulocytes     Status: None   Collection Time: 02/12/19  3:58 AM  Result Value Ref Range   Retic Ct Pct 1.2 0.4 - 3.1 %   RBC. 4.04 3.87 - 5.11 MIL/uL   Retic Count, Absolute 50.1 19.0 - 186.0 K/uL   Immature Retic  Fract 7.8 2.3 - 15.9 %  Comprehensive metabolic panel     Status: Abnormal   Collection Time: 02/12/19  3:58 AM  Result Value Ref Range   Sodium 158 (H) 135 - 145 mmol/L   Potassium 3.5 3.5 - 5.1 mmol/L   Chloride 113 (H) 98 - 111 mmol/L   CO2 37 (H) 22 - 32 mmol/L   Glucose, Bld 75 70 - 99 mg/dL   BUN 31 (H) 8 - 23 mg/dL   Creatinine, Ser 8.11 0.44 - 1.00 mg/dL   Calcium 8.9 8.9 - 91.4 mg/dL   Total Protein 5.9 (L) 6.5 - 8.1 g/dL  Albumin 2.8 (L) 3.5 - 5.0 g/dL   AST 15 15 - 41 U/L   ALT 17 0 - 44 U/L   Alkaline Phosphatase 58 38 - 126 U/L   Total Bilirubin 0.6 0.3 - 1.2 mg/dL   GFR calc non Af Amer >60 >60 mL/min   GFR calc Af Amer >60 >60 mL/min   Anion gap 8 5 - 15  CBC     Status: Abnormal   Collection Time: 02/12/19  3:58 AM  Result Value Ref Range   WBC 7.8 4.0 - 10.5 K/uL   RBC 4.04 3.87 - 5.11 MIL/uL   Hemoglobin 11.8 (L) 12.0 - 15.0 g/dL   HCT 81.8 56.3 - 14.9 %   MCV 93.1 80.0 - 100.0 fL   MCH 29.2 26.0 - 34.0 pg   MCHC 31.4 30.0 - 36.0 g/dL   RDW 70.2 63.7 - 85.8 %   Platelets 242 150 - 400 K/uL   nRBC 0.0 0.0 - 0.2 %  Glucose, capillary     Status: None   Collection Time: 02/12/19  4:23 AM  Result Value Ref Range   Glucose-Capillary 79 70 - 99 mg/dL  Glucose, capillary     Status: Abnormal   Collection Time: 02/12/19  8:14 AM  Result Value Ref Range   Glucose-Capillary 197 (H) 70 - 99 mg/dL  Glucose, capillary     Status: Abnormal   Collection Time: 02/12/19 11:40 AM  Result Value Ref Range   Glucose-Capillary 392 (H) 70 - 99 mg/dL   Dg Chest 2 View  Result Date: 02/11/2019 CLINICAL DATA:  Pacemaker placement EXAM: CHEST - 2 VIEW COMPARISON:  Three days ago FINDINGS: Interval dual-chamber pacer from the left with leads over the right atrial appendage and right ventricle. Increased hazy density of the inferior chest attributed to small to moderate pleural effusions. No Kerley lines. No pneumothorax. Normal heart size. A feeding tube at least reaches the  stomach. IMPRESSION: 1. New dual-chamber pacer without complicating feature. 2. New small to moderate pleural effusions. Electronically Signed   By: Marnee Spring M.D.   On: 02/11/2019 06:23   Ct Head Wo Contrast  Result Date: 02/11/2019 CLINICAL DATA:  Follow-up examination for intracranial hemorrhage. EXAM: CT HEAD WITHOUT CONTRAST TECHNIQUE: Contiguous axial images were obtained from the base of the skull through the vertex without intravenous contrast. COMPARISON:  Prior CT from 02/07/2019 FINDINGS: Brain: There has been slight interval decrease in size of the dominant left frontal intraparenchymal hemorrhage, measuring up to approximately 4 cm in maximal diameter. Surrounding low-density vasogenic edema has increased with worsened regional mass effect. Mass effect on the adjacent frontal horn of the left lateral ventricle with up to 6 mm of left-to-right shift, increased from previous. No hydrocephalus or ventricular trapping. Additional small hemorrhagic contusions involving the anterior temporal poles relatively similar in size without significant regional mass effect. Scattered subarachnoid hemorrhage has decreased from previous. Extra-axial hemorrhage overlying the left frontal convexity relatively similar measuring up to 7 mm in maximal thickness. Subdural hemorrhage overlying the left parietal convexity relatively similar as well measuring up to 6 mm in maximal thickness, although a slightly less dense as compared to previous exam. There has been interval development of a small subdural hygroma overlying the right cerebral hemisphere measuring up to 5 mm. Remainder the brain is stable in appearance. No new intracranial hemorrhage. No acute large vessel territory infarct. Vascular: No hyperdense vessel. Calcified atherosclerosis noted at the skull base. Skull: Decreasing right parietal scalp contusion.  Calvarium unchanged. Sinuses/Orbits: Globes and orbital soft tissues demonstrate no acute finding.  Paranasal sinuses remain clear. Nasogastric tube noted. No mastoid effusion. Other: None. IMPRESSION: 1. Overall interval decrease in size of dominant 4 cm left frontal intraparenchymal hemorrhage, but with increased surrounding edema and regional mass effect. Associated left-to-right shift now measures up to 6 mm. No hydrocephalus or ventricular trapping. 2. Similar parenchymal contusions involving the anterior temporal poles bilaterally, with decreased subarachnoid hemorrhage as compared to previous. Subdural hemorrhage overlying the left cerebral convexity little interval changed. 3. Interval development of 5 mm right subdural hygroma. Electronically Signed   By: Rise Mu M.D.   On: 02/11/2019 17:15   Dg Swallowing Func-speech Pathology  Result Date: 02/11/2019 Objective Swallowing Evaluation: Type of Study: MBS-Modified Barium Swallow Study  Patient Details Name: Danielle Clarke MRN: [id number] Date of Birth: 03-07-1941 Today's Date: 02/11/2019 Time: SLP Start Time (ACUTE ONLY): 0913 -SLP Stop Time (ACUTE ONLY): 0930 SLP Time Calculation (min) (ACUTE ONLY): 17 min Past Medical History: Past Medical History: Diagnosis Date  Aortic stenosis, mild   Arthritis   Asthmatic bronchitis   with colds per patient  Carotid artery disease (HCC)   40-59% bilateral ICA stenosis  Cataract   Cutaneous abscess of right foot   Glaucoma   Heart murmur   Dr Katrinka Blazing is her  cardiologist.   Hyperlipemia   Hypertension   Dr. Katrinka Blazing ~ 2 years ago  Neck fracture [facility])   july 2013  Osteoporosis   Syncope   Type 1 diabetes mellitus (HCC)  Past Surgical History: Past Surgical History: Procedure Laterality Date  ANKLE FRACTURE SURGERY Left 2001  steel plate and 3 screws   ANTERIOR CERVICAL CORPECTOMY N/A 07/31/2014  Procedure: Cervical Four to Cervical Six Corpectomy;  Surgeon: Karn Cassis, MD;  Location: [facility];  Service: Neurosurgery;  Laterality: N/A;  C4 to C6 Corpectomy  BREAST SURGERY  1988   CATARACT EXTRACTION W/ INTRAOCULAR LENS  IMPLANT, BILATERAL Bilateral 2011  right and then left  EYE SURGERY    FRACTURE SURGERY    HAMMER TOE SURGERY  1998  I&D EXTREMITY Right 09/27/2018  Procedure: IRRIGATION AND DEBRIDEMENT RIGHT FOOT;  Surgeon: Nadara Mustard, MD;  Location: [facility] OR;  Service: Orthopedics;  Laterality: Right;  JOINT REPLACEMENT    PACEMAKER IMPLANT N/A 02/10/2019  Procedure: PACEMAKER IMPLANT;  Surgeon: Marinus Maw, MD;  Location: [facility];  Service: Cardiovascular;  Laterality: N/A;  TONSILLECTOMY AND ADENOIDECTOMY  1948  TOTAL SHOULDER REPLACEMENT  2010  right shoulder   TUBAL LIGATION  1979  TYMPANOSTOMY TUBE PLACEMENT Bilateral  HPI: MYLIAH MEDEL is a 78 y.o. female with history of diabetes mellitus type 1, asthmatic bronchitis, neck fx 2013, hyperlipidemia was brought to the ER after syncopal episode at the dentist office. Had some difficulty talking. CT head showed left frontal intraparenchymal hematoma and subarachnoid and subdural hematoma in both convexities (small hemorrhagic contusions at the anterior temporal poles bilaterally). Pt had coffee-ground emesis, nause/vomiting which has resolved and GI has signed off. Reported history of syncope ascribed to swallowing and coughing.  Subjective: alert Assessment / Plan / Recommendation CHL IP CLINICAL IMPRESSIONS 02/11/2019 Clinical Impression Pt exhibits a mild-moderate oropharyngeal dysphagia marked by laryngeal penetration with thin and nectar. Orally, she demonstrated delays in transit and bolus cohesion and fluidity of transit to pharynx. Pt unable to transit solid bolus into hypopharynx without thin liquid to assist accompanied by episodes of gagging. Barium entered nasal cavity for  several seconds x 1 before exiting and swallow initiated. Epiglottic inversion was incomplete without retroflexion and mistiming of laryngeal closure allowing thin and nectar thick barium to reach just above the vocal cords. She  was sensate to majority of penetrates, clearing throat reflexively to inconsistently clear penetrates. Chin tuck was ineffective to eliminate or decrease penetration. Decreased epiglottic inversion and contraction led to mild vallecular and pyriform sinus residue. Given effort required for regular texture solids to transit pharynx recommend initiation of full liquid diet, no straws, small sips, multiple swallows, throat clears throughout meals and crush pills. ST will continue.      SLP Visit Diagnosis Dysphagia, oropharyngeal phase (R13.12) Attention and concentration deficit following -- Frontal lobe and executive function deficit following -- Impact on safety and function Moderate aspiration risk   CHL IP TREATMENT RECOMMENDATION 02/11/2019 Treatment Recommendations Therapy as outlined in treatment plan below   Prognosis 02/11/2019 Prognosis for Safe Diet Advancement Good Barriers to Reach Goals -- Barriers/Prognosis Comment -- CHL IP DIET RECOMMENDATION 02/11/2019 SLP Diet Recommendations Thin liquid;Other (Comment) Liquid Administration via Cup;No straw Medication Administration Crushed with puree Compensations Small sips/bites;Slow rate;Multiple dry swallows after each bite/sip;Clear throat intermittently Postural Changes Seated upright at 90 degrees   CHL IP OTHER RECOMMENDATIONS 02/11/2019 Recommended Consults -- Oral Care Recommendations Oral care BID Other Recommendations --   CHL IP FOLLOW UP RECOMMENDATIONS 02/11/2019 Follow up Recommendations Home health SLP   CHL IP FREQUENCY AND DURATION 02/11/2019 Speech Therapy Frequency (ACUTE ONLY) min 2x/week Treatment Duration 2 weeks      CHL IP ORAL PHASE 02/11/2019 Oral Phase Impaired Oral - Pudding Teaspoon -- Oral - Pudding Cup -- Oral - Honey Teaspoon -- Oral - Honey Cup -- Oral - Nectar Teaspoon -- Oral - Nectar Cup Decreased bolus cohesion Oral - Nectar Straw -- Oral - Thin Teaspoon -- Oral - Thin Cup Decreased bolus cohesion Oral - Thin Straw Decreased bolus  cohesion Oral - Puree Reduced posterior propulsion Oral - Mech Soft -- Oral - Regular Reduced posterior propulsion Oral - Multi-Consistency -- Oral - Pill -- Oral Phase - Comment --  CHL IP PHARYNGEAL PHASE 02/11/2019 Pharyngeal Phase Impaired Pharyngeal- Pudding Teaspoon -- Pharyngeal -- Pharyngeal- Pudding Cup -- Pharyngeal -- Pharyngeal- Honey Teaspoon -- Pharyngeal -- Pharyngeal- Honey Cup -- Pharyngeal -- Pharyngeal- Nectar Teaspoon -- Pharyngeal -- Pharyngeal- Nectar Cup Penetration/Aspiration during swallow;Reduced epiglottic inversion;Pharyngeal residue - valleculae;Pharyngeal residue - pyriform Pharyngeal Material enters airway, remains ABOVE vocal cords and not ejected out Pharyngeal- Nectar Straw -- Pharyngeal -- Pharyngeal- Thin Teaspoon -- Pharyngeal -- Pharyngeal- Thin Cup Penetration/Aspiration during swallow;Pharyngeal residue - valleculae;Pharyngeal residue - pyriform;Reduced epiglottic inversion;Reduced airway/laryngeal closure Pharyngeal Material enters airway, remains ABOVE vocal cords and not ejected out Pharyngeal- Thin Straw Pharyngeal residue - pyriform;Pharyngeal residue - valleculae;Penetration/Aspiration during swallow;Reduced airway/laryngeal closure;Reduced epiglottic inversion Pharyngeal Material enters airway, remains ABOVE vocal cords and not ejected out Pharyngeal- Puree WFL Pharyngeal -- Pharyngeal- Mechanical Soft -- Pharyngeal -- Pharyngeal- Regular (No Data) Pharyngeal -- Pharyngeal- Multi-consistency -- Pharyngeal -- Pharyngeal- Pill -- Pharyngeal -- Pharyngeal Comment --  CHL IP CERVICAL ESOPHAGEAL PHASE 02/11/2019 Cervical Esophageal Phase WFL Pudding Teaspoon -- Pudding Cup -- Honey Teaspoon -- Honey Cup -- Nectar Teaspoon -- Nectar Cup -- Nectar Straw -- Thin Teaspoon -- Thin Cup -- Thin Straw -- Puree -- Mechanical Soft -- Regular -- Multi-consistency -- Pill -- Cervical Esophageal Comment -- Royce Macadamia 02/11/2019, 11:25 AM  Breck Coons Lonell Face.Ed Community education officer [phone] Office [phone]  Assessment/Plan: Diagnosis: left frontal intraparenchymal hematoma Labs and images (see above) independently reviewed.  Records reviewed and summated above.  1. Does the need for close, 24 hr/day medical supervision in concert with the patient's rehab needs make it unreasonable for this patient to be served in a less intensive setting? Yes  2. Co-Morbidities requiring supervision/potential complications: DM-extremely labile at present (Monitor in accordance with exercise and adjust meds as necessary), hyperlipidemia, HTN (monitor and provide prns in accordance with increased physical exertion and pain), mild aortic stenosis, carotid artery disease, anterior cervical corpectomy, recurrent syncopal episodes, hypernatremia (repeat labs, treat if necessary), anemia of chronic disease (repeat labs, consider transfusion if necessary to ensure appropriate perfusion for increased activity tolerance) 3. Due to bladder management, bowel management, safety, skin/wound care, disease management, medication administration and patient education, does the patient require 24 hr/day rehab nursing? Yes 4. Does the patient require coordinated care of a physician, rehab nurse, PT (1-2 hrs/day, 5 days/week), OT (1-2 hrs/day, 5 days/week) and SLP (1-2 hrs/day, 5 days/week) to address physical and functional deficits in the context of the above medical diagnosis(es)? Yes Addressing deficits in the following areas: balance, endurance, locomotion, strength, transferring, bathing, dressing, toileting, cognition, language, swallowing and psychosocial support 5. Can the patient actively participate in an intensive therapy program of at least 3 hrs of therapy per day at least 5 days per week? Yes 6. The potential for patient to make measurable gains while on inpatient rehab is excellent 7. Anticipated functional outcomes upon discharge from inpatient rehab are  supervision  with PT, supervision with OT, supervision with SLP. 8. Estimated rehab length of stay to reach the above functional goals is: 8-13 days. 9. Anticipated D/C setting: Home 10. Anticipated post D/C treatments: HH therapy and Home excercise program 11. Overall Rehab/Functional Prognosis: excellent and good  RECOMMENDATIONS: This patient's condition is appropriate for continued rehabilitative care in the following setting: CIR medically stable. Patient has agreed to participate in recommended program. Potentially Note that insurance prior authorization may be required for reimbursement for recommended care.  Comment: Rehab Admissions Coordinator to follow up.   I have personally performed a face to face diagnostic evaluation, including, but not limited to relevant history and physical exam findings, of this patient and developed relevant assessment and plan.  Additionally, I have reviewed and concur with the physician assistant's documentation above.   Maryla Morrow, MD, ABPMR Mcarthur Rossetti Angiulli, PA-C 02/12/2019

## 2019-02-12 NOTE — Plan of Care (Signed)
  Problem: Education: Goal: Knowledge of disease or condition will improve Outcome: Progressing Note:  POC reviewed with pt. and re: safety- bed alarm on.

## 2019-02-12 NOTE — Progress Notes (Signed)
[address] TEAM 1 - Stepdown/ICU TEAM  Danielle Clarke  PJA:[id number] DOB: 1941/03/21 DOA: 02/06/2019 PCP: Juluis Rainier, MD    Brief Narrative:  [phone] aortic stenosis, Carotid dx, ckd iii,htn and glaucoma, swallow assoc syncopy who has a syncopal episode after a dental visit which lead to a closed head trauma.  She was seen also by cardiology because of  12 sec pauses and needed atropine  Significant Events: 3/12 Admit 3/13 CT > L frontal lobe intraparenchymal hematoma, with localized cerebral edema and mass-effect with mild left to right shift - bradycardia and pauses - NS consulted  3/14 bradycardic pauses - Cards consult  3/16 Cortrak placed 3/17 TRH assumed care   Subjective:  Awake alert coherent--answers all orienting quesitons well No fever no pain no cough no cold  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 & L>R midline shift - small volume subarachnoid bleeding  Evaluated by NS - no role for surgery - BP goal < 160 - f/u CT head today has noted improved ICH, but somewhat increased per-hemorrhage edema and resultant shift - cont to follow in SDU w/ serial exam  She is stabilizing and should be ready to work mor ewith therapyI willconsult CIr for attempt at [employer] rehab  Dysphagia Cleared for a liquid diet per SLP today - cont same and follow   High grade heart block > recurrent syncope S/p pacemaker insertion 3/16 - stable per EP Some pauses earlier today--none seen on monitor  Moderate-severe aortic stenosis M noted on exam - no hemodynamic issue presently   Hypokalemia  Correct to goal of 4.0  Hypernatremia 158 Increase free water and follow Placed on d5 75 cc/h Will need continued checkign on this and keep on SDU today  Nausea and vomiting with intermittent coffee ground emesis Likely diabetic gastroparesis - improved s/p PPI infusion  DM CBG poorly controlled - adjust tx and follow  Increased lantus form 18--20.  Will  chang to am admin at family request  Anemia Check anemia panel --Sat ratio and iron levelfs fine  DVT prophylaxis: SCDs Code Status: FULL CODE Family Communication: spoke w/ dgtr at bedside -CIr consult placed Disposition Plan: SDU  Consultants:  PCCM EP NS  Antimicrobials:  none  Objective: Blood pressure (!) 166/70, pulse 98, temperature 98.3 F (36.8 C), temperature source Oral, resp. rate 19, height 5' (1.524 m), weight 49.5 kg, SpO2 100 %.  Intake/Output Summary (Last 24 hours) at 02/12/2019 1235 Last data filed at 02/12/2019 0855 Gross per 24 hour  Intake 740 ml  Output 600 ml  Net 140 ml   Filed Weights   02/10/19 0336 02/11/19 0307 02/11/19 1100  Weight: 49.8 kg 50.9 kg 49.5 kg    Examination: Alert pleasant coherent in nad cortrak in place abd soft  s1 s 2no m Ppm site clean and badaged abd soft nt nd no rebound no guard No le edema CBC: Recent Labs  Lab 02/06/19 1957  02/10/19 0540 02/11/19 0411 02/12/19 0358  WBC 11.4*   < > 15.1* 7.9 7.8  NEUTROABS 9.4*  --   --   --   --   HGB 11.4*   < > 10.8* 11.8* 11.8*  HCT 35.9*   < > 32.5* 36.4 37.6  MCV 92.1   < > 91.5 93.3 93.1  PLT 273   < > 265 278 242   < > = values in this interval not displayed.   Basic Metabolic Panel: Recent  Labs  Lab 02/07/19 0439  02/10/19 0540 02/10/19 1932 02/11/19 0411 02/12/19 0358  NA 137   < > 148* 151* 153* 158*  K 4.3   < > 2.5* 4.1 3.2* 3.5  CL 93*   < > 107 111 113* 113*  CO2 26   < > 29 26 27  37*  GLUCOSE 290*   < > 154* 309* 261* 75  BUN 19   < > 29* 36* 35* 31*  CREATININE 0.90   < > 0.92 0.93 0.93 0.73  CALCIUM 9.5   < > 8.5* 8.5* 8.8* 8.9  MG 1.9  --  2.1  --  2.4  --   PHOS  --   --   --   --  2.1*  --    < > = values in this interval not displayed.   GFR: Estimated Creatinine Clearance: 43 mL/min (by C-G formula based on SCr of 0.73 mg/dL).  Liver Function Tests: Recent Labs  Lab 02/08/19 1144 02/09/19 0624 02/10/19 0540 02/12/19 0358   AST 27 25 20 15   ALT 21 21 19 17   ALKPHOS 62 53 65 58  BILITOT 0.7 0.8 1.1 0.6  PROT 6.2* 6.3* 6.5 5.9*  ALBUMIN 3.5 3.2* 3.1* 2.8*    Coagulation Profile: Recent Labs  Lab 02/07/19 0238  INR 0.9    Cardiac Enzymes: Recent Labs  Lab 02/07/19 0439 02/07/19 0849 02/07/19 1403  TROPONINI <0.03 <0.03 <0.03    HbA1C: Hgb A1c MFr Bld  Date/Time Value Ref Range Status  01/04/2017 06:21 PM 7.9 (H) 4.8 - 5.6 % Final    Comment:    (NOTE)         Pre-diabetes: 5.7 - 6.4         Diabetes: >6.4         Glycemic control for adults with diabetes: <7.0     CBG: Recent Labs  Lab 02/11/19 2016 02/11/19 2324 02/12/19 0423 02/12/19 0814 02/12/19 1140  GLUCAP 420* 228* 79 197* 392*    Recent Results (from the past 240 hour(s))  MRSA PCR Screening     Status: None   Collection Time: 02/07/19  3:07 AM  Result Value Ref Range Status   MRSA by PCR NEGATIVE NEGATIVE Final    Comment:        The GeneXpert MRSA Assay (FDA approved for NASAL specimens only), is one component of a comprehensive MRSA colonization surveillance program. It is not intended to diagnose MRSA infection nor to guide or monitor treatment for MRSA infections. Performed at [facility], [address] [place], [place] [address]   Surgical PCR screen     Status: None   Collection Time: 02/10/19  6:31 AM  Result Value Ref Range Status   MRSA, PCR NEGATIVE NEGATIVE Final   Staphylococcus aureus NEGATIVE NEGATIVE Final    Comment: (NOTE) The Xpert SA Assay (FDA approved for NASAL specimens in patients 77 years of age and older), is one component of a comprehensive surveillance program. It is not intended to diagnose infection nor to guide or monitor treatment. Performed at [facility], [address] [place], [place] [address]      Scheduled Meds: . free water  200 mL Per Tube Q6H  . insulin aspart  0-15 Units Subcutaneous Q4H  . insulin glargine  18 Units Subcutaneous QHS  .  mouth rinse  15 mL Mouth Rinse q12n4p  . metoprolol tartrate  25 mg Oral BID  . pantoprazole  40 mg Intravenous Q12H  . potassium chloride  40 mEq Per Tube Once   Continuous Infusions: . clindamycin (CLEOCIN) IV 300 mg (02/12/19 0601)  . dextrose 75 mL/hr at 02/12/19 0914  . feeding supplement (OSMOLITE 1.2 CAL) Stopped (02/11/19 0310)     LOS: 5 days   Lonia Blood, MD [facility] Office  [phone] Pager - Text Page per Amion  If 7PM-7AM, please contact night-coverage per Amion 02/12/2019, 12:35 PM

## 2019-02-12 NOTE — Progress Notes (Signed)
Nutrition Follow-up  DOCUMENTATION CODES:   Not applicable  INTERVENTION:  Provide Glucerna Shake po TID, each supplement provides 220 kcal and 10 grams of protein.  Continue free water flushes via Cortrak NGT. (MD to adjust as appropriate)  NUTRITION DIAGNOSIS:   Inadequate oral intake related to dysphagia as evidenced by NPO status; improving  GOAL:   Patient will meet greater than or equal to 90% of their needs; progressing  MONITOR:   PO intake, Supplement acceptance, Diet advancement, Labs, Weight trends, I & O's, Skin  REASON FOR ASSESSMENT:   Consult Enteral/tube feeding initiation and management  ASSESSMENT:   78 yo female, admitted with intracranial bleeding s/p fall, loss of consciousness. PMH significant for DM type 1, HLD, HTN, osteoporosis.  3/16: Cortrak NGT placed; pacemaker placement   Pt underwent MBS yesterday. Diet has been advanced to a full liquid diet. Meal completion has been 25-50% with 50% at breakfast today. Tube feeding has been discontinued. Cortrak NGT to be kept in place for free water flushes as sodium has been elevated. Pt currently has free water flushes of 350 ml q  6 hours. IV fluids infusing as well. RD to order nutritional supplements to aid in caloric and protein needs. Family and RN staff has been encouraging pt po intake at meals. Unable to perform focused exam during time of visit as pt receiving nursing care during time of visit.  Labs and medications reviewed. Sodium elevated at 158. Chloride elevated at 113.   Diet Order:   Diet Order            Diet full liquid Room service appropriate? Yes; Fluid consistency: Thin  Diet effective now              EDUCATION NEEDS:   Education needs have been addressed  Skin:  Skin Assessment: Reviewed RN Assessment Skin Integrity Issues:: Other (Comment) Other: Wound - posterior head; Petechiae - abdomen  Last BM:  3/18  Height:   Ht Readings from Last 1 Encounters:  02/06/19  5' (1.524 m)    Weight:   Wt Readings from Last 1 Encounters:  02/11/19 49.5 kg    Ideal Body Weight:  45.5 kg  BMI:  Body mass index is 21.31 kg/m.  Estimated Nutritional Needs:   Kcal:  1350-1600  Protein:  60-75 grams  Fluid:  >/= 1.5 L daily or per MD    Roslyn Smiling, MS, RD, LDN Pager # [phone] After hours/ weekend pager # [phone]

## 2019-02-12 NOTE — Progress Notes (Signed)
CCMD called earlier in shift about pt.'s pacer with misfire and paged Dr. Santiago Glad.; RTC.

## 2019-02-13 DIAGNOSIS — D508 Other iron deficiency anemias: Secondary | ICD-10-CM

## 2019-02-13 LAB — RENAL FUNCTION PANEL
Albumin: 2.6 g/dL — ABNORMAL LOW (ref 3.5–5.0)
Anion gap: 9 (ref 5–15)
BUN: 16 mg/dL (ref 8–23)
CO2: 32 mmol/L (ref 22–32)
Calcium: 8 mg/dL — ABNORMAL LOW (ref 8.9–10.3)
Chloride: 100 mmol/L (ref 98–111)
Creatinine, Ser: 0.78 mg/dL (ref 0.44–1.00)
GFR calc Af Amer: >60 mL/min (ref >60)
GFR calc non Af Amer: >60 mL/min (ref >60)
Glucose, Bld: 160 mg/dL — ABNORMAL HIGH (ref 70–99)
Phosphorus: 2 mg/dL — ABNORMAL LOW (ref 2.5–4.6)
Potassium: 3.5 mmol/L (ref 3.5–5.1)
Sodium: 141 mmol/L (ref 135–145)

## 2019-02-13 LAB — GLUCOSE, CAPILLARY
Glucose-Capillary: 105 mg/dL — ABNORMAL HIGH (ref 70–99)
Glucose-Capillary: 126 mg/dL — ABNORMAL HIGH (ref 70–99)
Glucose-Capillary: 134 mg/dL — ABNORMAL HIGH (ref 70–99)
Glucose-Capillary: 205 mg/dL — ABNORMAL HIGH (ref 70–99)
Glucose-Capillary: 259 mg/dL — ABNORMAL HIGH (ref 70–99)
Glucose-Capillary: 304 mg/dL — ABNORMAL HIGH (ref 70–99)
Glucose-Capillary: 377 mg/dL — ABNORMAL HIGH (ref 70–99)
Glucose-Capillary: 54 mg/dL — ABNORMAL LOW (ref 70–99)

## 2019-02-13 MED ORDER — INSULIN GLARGINE 100 UNIT/ML ~~LOC~~ SOLN
10.0000 [IU] | Freq: Two times a day (BID) | SUBCUTANEOUS | Status: DC
  Administered 2019-02-13 – 2019-02-14 (×2): 10 [IU] via SUBCUTANEOUS
  Filled 2019-02-13 (×3): qty 0.1

## 2019-02-13 MED ORDER — LIDOCAINE 5 % EX PTCH
1.0000 | MEDICATED_PATCH | CUTANEOUS | Status: DC
  Filled 2019-02-13 (×2): qty 1

## 2019-02-13 MED ORDER — PANTOPRAZOLE SODIUM 40 MG PO TBEC
40.0000 mg | DELAYED_RELEASE_TABLET | Freq: Two times a day (BID) | ORAL | Status: DC
  Administered 2019-02-13: 40 mg via ORAL
  Filled 2019-02-13 (×2): qty 1

## 2019-02-13 NOTE — Progress Notes (Signed)
Physical Therapy Treatment Patient Details Name: Danielle Clarke MRN: [id number] DOB: 1941/06/28 Today's Date: 02/13/2019    History of Present Illness 78 yo with syncope at dentist office with Left frontal Intraparenchymal hematoma with bil R>L temporal contusions. s/p pacer placement 3/16. PMHx: HTN, DM, glaucoma, HLD, RT TSA    PT Comments    Pt in bed upon arrival and agreed to participate with therapy. Pt is verbalizing more during this session but functional mobility appears to be more difficult. Pt required mod assist for transfers and gait training and frequent encouragement and motivation to participate. Pt reported dizziness with sitting up EOB and during ambulation. BP sitting: 142/77, BP: sitting post ambulation: 130/80. Pt presented with increased fatigue during todays session. Pt verbalized being scared to walk out into hallway but with encouragement and reassurance ambulated short distance in hall. [name] daughter believes she is scared of falling. [name] daughter had concerns over inpatient rehab stay with therapist providing education on process and procedures for inpatient rehab and recommended speaking to rehab coordinator. Pt would continue to benefit from skilled acute therapy to progress functional mobility and decrease caregiver burden.   Follow Up Recommendations  CIR;Supervision/Assistance - 24 hour     Equipment Recommendations  Rolling walker with 5" wheels    Recommendations for Other Services       Precautions / Restrictions Precautions Precautions: Fall;ICD/Pacemaker Restrictions Weight Bearing Restrictions: Yes LUE Weight Bearing: Non weight bearing    Mobility  Bed Mobility Overal bed mobility: Needs Assistance Bed Mobility: Supine to Sit     Supine to sit: Min assist     General bed mobility comments: assist to elevate trunk and scooting hips forward to square up EOB, pt reported dizziness upon sitting EOB, BP 142/77  Transfers Overall transfer  level: Needs assistance Equipment used: Rolling walker (2 wheeled) Transfers: Sit to/from Stand Sit to Stand: Mod assist;+2 physical assistance         General transfer comment: x4 trials from bed and recliner, cuing for hand placement, assist to rise, pt required frequent encouragement, cuing for pacemaker precautions  Ambulation/Gait Ambulation/Gait assistance: Mod assist;+2 safety/equipment Gait Distance (Feet): 45 Feet Assistive device: Rolling walker (2 wheeled) Gait Pattern/deviations: Step-through pattern;Decreased stride length;Shuffle;Narrow base of support;Trunk flexed Gait velocity: decreased   General Gait Details: pt very hesitant to ambulate today requiring encouragement, pt reported a fear of leaving the room, pt had frequent complaints of dizziness and fatigue BP 130/80, pt ambulated 5, 15, 45 feet requiring sitting rest breaks in between   Stairs             Wheelchair Mobility    Modified Rankin (Stroke Patients Only) Modified Rankin (Stroke Patients Only) Pre-Morbid Rankin Score: No symptoms Modified Rankin: Moderately severe disability     Balance Overall balance assessment: Needs assistance Sitting-balance support: Bilateral upper extremity supported;Feet supported Sitting balance-Leahy Scale: Fair Sitting balance - Comments: pt requires BUE support sitting EOB   Standing balance support: Bilateral upper extremity supported;During functional activity Standing balance-Leahy Scale: Poor Standing balance comment: pt requires bil UE support and external assist                            Cognition Arousal/Alertness: Awake/alert Behavior During Therapy: Flat affect Overall Cognitive Status: Impaired/Different from baseline Area of Impairment: Orientation;Problem solving;Attention;Memory;Following commands;Safety/judgement;Awareness                 Current Attention Level: Focused Memory: Decreased recall  of precautions;Decreased  short-term memory Following Commands: Follows one step commands inconsistently;Follows one step commands with increased time Safety/Judgement: Decreased awareness of deficits;Decreased awareness of safety Awareness: Intellectual Problem Solving: Slow processing;Decreased initiation;Difficulty sequencing;Requires verbal cues General Comments: pt talking more throughout session but complaining more of fatigue, pain and dizziness, required frequent encouragement       Exercises      General Comments        Pertinent Vitals/Pain Pain Assessment: Faces Faces Pain Scale: Hurts little more Pain Location: back Pain Descriptors / Indicators: Grimacing;Moaning Pain Intervention(s): Limited activity within patient's tolerance;Monitored during session;Repositioned    Home Living                      Prior Function            PT Goals (current goals can now be found in the care plan section) Progress towards PT goals: Progressing toward goals    Frequency    Min 4X/week      PT Plan Current plan remains appropriate    Co-evaluation              AM-PAC PT "6 Clicks" Mobility   Outcome Measure  Help needed turning from your back to your side while in a flat bed without using bedrails?: A Little Help needed moving from lying on your back to sitting on the side of a flat bed without using bedrails?: A Little Help needed moving to and from a bed to a chair (including a wheelchair)?: A Lot Help needed standing up from a chair using your arms (e.g., wheelchair or bedside chair)?: A Lot Help needed to walk in hospital room?: A Lot Help needed climbing 3-5 steps with a railing? : A Lot 6 Click Score: 14    End of Session Equipment Utilized During Treatment: Gait belt Activity Tolerance: Patient limited by fatigue;Patient tolerated treatment well Patient left: in chair;with call bell/phone within reach;with chair alarm set;with family/visitor present Nurse  Communication: Mobility status PT Visit Diagnosis: Unsteadiness on feet (R26.81);Difficulty in walking, not elsewhere classified (R26.2)     Time: 0071-2197 PT Time Calculation (min) (ACUTE ONLY): 49 min  Charges:  $Gait Training: 23-37 mins $Therapeutic Activity: 8-22 mins                     Kennith Morss, [place] [phone]    Charlee Whitebread 02/13/2019, 1:30 PM

## 2019-02-13 NOTE — Progress Notes (Signed)
Inpatient Diabetes Program Recommendations  AACE/ADA: New Consensus Statement on Inpatient Glycemic Control  Target Ranges:  Prepandial:   less than 140 mg/dL      Peak postprandial:   less than 180 mg/dL (1-2 hours)      Critically ill patients:  140 - 180 mg/dL   Results for Danielle Clarke, Danielle Clarke (MRN [id number]) as of 02/13/2019 09:10  Ref. Range 02/12/2019 04:23 02/12/2019 08:14 02/12/2019 11:40 02/12/2019 19:40 02/12/2019 23:23 02/13/2019 03:53 02/13/2019 04:41 02/13/2019 08:09  Glucose-Capillary Latest Ref Range: 70 - 99 mg/dL 79 097 (H) 353 (H) 299 (H) 199 (H) 54 (L) 105 (H) 134 (H)   Review of Glycemic Control  Diabetes history: DM1 (makes NO insulin; requires basal, correction, and meal coverage insulin) Outpatient Diabetes medications: Tresiba 10 units daily, Humalog 2-13 units QID Current orders for Inpatient glycemic control: Lantus 20 units daily, Novolog 0-15 units Q4H  Inpatient Diabetes Program Recommendations:   Insulin-Correction: Please consider decreasing Novolog correction to sensitive scale 0-9 units and if patient is eating well, please change frequency from Q4H to AC&HS.   Insulin-Meal Coverage: Please consider ordering Novolog 4 units TID with meals for meal coverage if patient eats at least 50% of meals.  Thanks, Orlando Penner, RN, MSN, CDE Diabetes Coordinator Inpatient Diabetes Program [phone] (Team Pager from 8am to 5pm)

## 2019-02-13 NOTE — Progress Notes (Addendum)
PROGRESS NOTE    Danielle Clarke  TWS:[id number]  DOB: June 19, 1941  DOA: 02/06/2019 PCP: Juluis Rainier, MD  Brief Narrative: 78 year old female with history of hypertension,Hyperlipidemia, chronic kidney disease stage III, carotid artery disease, aortic stenosis, glaucoma, diabetes mellitus, recurrent syncopal episodes presented to ED on 3/13 after she had a syncope related fall at the dentist office resulting in ICH. On arrival in ER, patient had difficulty talking, confusion but remained non focal.Patient has been hypertensive since arrival.  CT/MRI head showing left frontal intraparenchymal hematoma with small volume mixed SAH/ SDH over bilateral convexities left greater than right. Posterior right parietal scalp hematoma consistent with contrecoup pattern of injury. Other trauma work-up including CT abdomen pelvis/CT cervical spine/CT chest were negative.   Neurosurgery was consulted who recommended conservative nonoperative management.On March 13 patient had nausea/vomiting with head imaging showing cerebral edema with mass-effect associated with bradycardia and pauses.  Patient was transferred to ICU for strict blood pressure control, was also seen by cardiology and underwent pacemaker placement on March 16.  Speech therapy has been following and patient deemed to be high aspiration risk requiring NG tube on 3/16.  She is now off NG tube and on a modified diet.  Subjective:  Patient now out of ICU and tolerating p.o. diet.  Daughter at bedside and reports that patient's speech much improved and cleared her but sometimes having trouble getting out her thoughts.Patient appears to be awake alert oriented x3 but does tend to get tangential at times?  Expressive aphasia. Patient is complaining of right rib pain, mostly on coughing  Objective: Vitals:   02/13/19 0353 02/13/19 0404 02/13/19 0809 02/13/19 1200  BP: (!) 178/62  (!) 174/64 118/63  Pulse: 75  82   Resp:   15 20  Temp: 98 F  (36.7 C)  98.7 F (37.1 C) 98.2 F (36.8 C)  TempSrc: Oral  Oral Oral  SpO2: 96%  96%   Weight:  51.6 kg    Height:        Intake/Output Summary (Last 24 hours) at 02/13/2019 1547 Last data filed at 02/13/2019 0900 Gross per 24 hour  Intake 589.93 ml  Output 600 ml  Net -10.07 ml   Filed Weights   02/11/19 0307 02/11/19 1100 02/13/19 0404  Weight: 50.9 kg 49.5 kg 51.6 kg    Physical Examination:  General exam: Appears calm and comfortable  Respiratory system: Clear to auscultation. Respiratory effort normal. Cardiovascular system: S1 & S2 heard, RRR. No JVD, murmurs, rubs, gallops or clicks. No pedal edema. Gastrointestinal system: Abdomen is nondistended, soft and nontender. No organomegaly or masses felt. Normal bowel sounds heard. Central nervous system: Alert and oriented x3. No focal neurological deficits except for slowly improving aphasia and dysphagia Extremities: Symmetric 4 x 5 power in all extremities. Skin: No rashes, lesions or ulcers Psychiatry:  Mood & affect appropriate.     Data Reviewed: I have personally reviewed following labs and imaging studies  CBC: Recent Labs  Lab 02/06/19 1957  02/07/19 1143 02/09/19 0624 02/10/19 0540 02/11/19 0411 02/12/19 0358  WBC 11.4*   < > 17.0* 20.2* 15.1* 7.9 7.8  NEUTROABS 9.4*  --   --   --   --   --   --   HGB 11.4*   < > 11.0* 10.7* 10.8* 11.8* 11.8*  HCT 35.9*   < > 32.7* 33.8* 32.5* 36.4 37.6  MCV 92.1   < > 89.8 93.6 91.5 93.3 93.1  PLT 273   < >  267 276 265 278 242   < > = values in this interval not displayed.   Basic Metabolic Panel: Recent Labs  Lab 02/07/19 0439  02/10/19 0540 02/10/19 1932 02/11/19 0411 02/12/19 0358 02/13/19 0736  NA 137   < > 148* 151* 153* 158* 141  K 4.3   < > 2.5* 4.1 3.2* 3.5 3.5  CL 93*   < > 107 111 113* 113* 100  CO2 26   < > 29 26 27  37* 32  GLUCOSE 290*   < > 154* 309* 261* 75 160*  BUN 19   < > 29* 36* 35* 31* 16  CREATININE 0.90   < > 0.92 0.93 0.93 0.73  0.78  CALCIUM 9.5   < > 8.5* 8.5* 8.8* 8.9 8.0*  MG 1.9  --  2.1  --  2.4  --   --   PHOS  --   --   --   --  2.1*  --  2.0*   < > = values in this interval not displayed.   GFR: Estimated Creatinine Clearance: 43 mL/min (by C-G formula based on SCr of 0.78 mg/dL). Liver Function Tests: Recent Labs  Lab 02/07/19 0439 02/08/19 1144 02/09/19 0624 02/10/19 0540 02/12/19 0358 02/13/19 0736  AST 32 27 25 20 15   --   ALT 25 21 21 19 17   --   ALKPHOS 68 62 53 65 58  --   BILITOT 0.6 0.7 0.8 1.1 0.6  --   PROT 6.7 6.2* 6.3* 6.5 5.9*  --   ALBUMIN 3.7 3.5 3.2* 3.1* 2.8* 2.6*   No results for input(s): LIPASE, AMYLASE in the last 168 hours. No results for input(s): AMMONIA in the last 168 hours. Coagulation Profile: Recent Labs  Lab 02/07/19 0238  INR 0.9   Cardiac Enzymes: Recent Labs  Lab 02/07/19 0439 02/07/19 0849 02/07/19 1403  TROPONINI <0.03 <0.03 <0.03   BNP (last 3 results) No results for input(s): PROBNP in the last 8760 hours. HbA1C: No results for input(s): HGBA1C in the last 72 hours. CBG: Recent Labs  Lab 02/12/19 2323 02/13/19 0353 02/13/19 0441 02/13/19 0809 02/13/19 1203  GLUCAP 199* 54* 105* 134* 304*   Lipid Profile: No results for input(s): CHOL, HDL, LDLCALC, TRIG, CHOLHDL, LDLDIRECT in the last 72 hours. Thyroid Function Tests: No results for input(s): TSH, T4TOTAL, FREET4, T3FREE, THYROIDAB in the last 72 hours. Anemia Panel: Recent Labs    02/12/19 0358  VITAMINB12 2,855*  FOLATE 27.4  FERRITIN 87  TIBC 244*  IRON 46  RETICCTPCT 1.2   Sepsis Labs: No results for input(s): PROCALCITON, LATICACIDVEN in the last 168 hours.  Recent Results (from the past 240 hour(s))  MRSA PCR Screening     Status: None   Collection Time: 02/07/19  3:07 AM  Result Value Ref Range Status   MRSA by PCR NEGATIVE NEGATIVE Final    Comment:        The GeneXpert MRSA Assay (FDA approved for NASAL specimens only), is one component of a comprehensive  MRSA colonization surveillance program. It is not intended to diagnose MRSA infection nor to guide or monitor treatment for MRSA infections. Performed at [facility], [address] [place], [place] [address]   Surgical PCR screen     Status: None   Collection Time: 02/10/19  6:31 AM  Result Value Ref Range Status   MRSA, PCR NEGATIVE NEGATIVE Final   Staphylococcus aureus NEGATIVE NEGATIVE Final  Comment: (NOTE) The Xpert SA Assay (FDA approved for NASAL specimens in patients 7 years of age and older), is one component of a comprehensive surveillance program. It is not intended to diagnose infection nor to guide or monitor treatment. Performed at [facility], [address] [place], [place] [address]       Radiology Studies: Ct Head Wo Contrast  Result Date: 02/11/2019 CLINICAL DATA:  Follow-up examination for intracranial hemorrhage. EXAM: CT HEAD WITHOUT CONTRAST TECHNIQUE: Contiguous axial images were obtained from the base of the skull through the vertex without intravenous contrast. COMPARISON:  Prior CT from 02/07/2019 FINDINGS: Brain: There has been slight interval decrease in size of the dominant left frontal intraparenchymal hemorrhage, measuring up to approximately 4 cm in maximal diameter. Surrounding low-density vasogenic edema has increased with worsened regional mass effect. Mass effect on the adjacent frontal horn of the left lateral ventricle with up to 6 mm of left-to-right shift, increased from previous. No hydrocephalus or ventricular trapping. Additional small hemorrhagic contusions involving the anterior temporal poles relatively similar in size without significant regional mass effect. Scattered subarachnoid hemorrhage has decreased from previous. Extra-axial hemorrhage overlying the left frontal convexity relatively similar measuring up to 7 mm in maximal thickness. Subdural hemorrhage overlying the left parietal convexity relatively similar as  well measuring up to 6 mm in maximal thickness, although a slightly less dense as compared to previous exam. There has been interval development of a small subdural hygroma overlying the right cerebral hemisphere measuring up to 5 mm. Remainder the brain is stable in appearance. No new intracranial hemorrhage. No acute large vessel territory infarct. Vascular: No hyperdense vessel. Calcified atherosclerosis noted at the skull base. Skull: Decreasing right parietal scalp contusion. Calvarium unchanged. Sinuses/Orbits: Globes and orbital soft tissues demonstrate no acute finding. Paranasal sinuses remain clear. Nasogastric tube noted. No mastoid effusion. Other: None. IMPRESSION: 1. Overall interval decrease in size of dominant 4 cm left frontal intraparenchymal hemorrhage, but with increased surrounding edema and regional mass effect. Associated left-to-right shift now measures up to 6 mm. No hydrocephalus or ventricular trapping. 2. Similar parenchymal contusions involving the anterior temporal poles bilaterally, with decreased subarachnoid hemorrhage as compared to previous. Subdural hemorrhage overlying the left cerebral convexity little interval changed. 3. Interval development of 5 mm right subdural hygroma. Electronically Signed   By: Rise Mu M.D.   On: 02/11/2019 17:15        Scheduled Meds: . feeding supplement (GLUCERNA SHAKE)  237 mL Oral TID BM  . free water  350 mL Per Tube Q6H  . insulin aspart  0-15 Units Subcutaneous Q4H  . insulin glargine  20 Units Subcutaneous Daily  . mouth rinse  15 mL Mouth Rinse q12n4p  . metoprolol tartrate  25 mg Oral BID  . pantoprazole  40 mg Intravenous Q12H  . potassium chloride  40 mEq Per Tube Once   Continuous Infusions: . dextrose 75 mL/hr at 02/13/19 1206    Assessment & Plan:    1.  Intracranial hemorrhage: Secondary to fall/problem #2.  Seen by neurosurgery and recommended conservative management.  Repeat CT head on March 17  shows overall decrease in size of the left frontal intracranial bleed area but somewhat increased surrounding edema/midline shift.  Discussed with neurosurgery APP who recommended to follow clinically as intracranial findings may look worse up to 72 hours before improving.  Repeat imaging if there is any neurological decline on clinical exam.  Speech therapy following for aphasia/dysphagia.  PT/OT and  inpatient rehab evaluation in process.  Neurochecks every shift  2.  Recurrent syncope: Patient has history of swallowing/cough syncope.  Symptomatic bradycardia likely a consequence of problem #1 rather than because of problem #2.  Patient now status post pacemaker.  She required antihypertensives/drips in ICU course for adequate blood pressure control.  Doubt orthostasis but will check during PT eval  3. Dysphagia: Seen by speech for follow-up and cleared for liquid diet.  Appreciate their evaluation and follow-up.  Will advance as tolerated.  Now off NG tube.  4.  Hypernatremia: Improved with free water/oral intake.  Continue to monitor periodically  5.  Symptomatic bradycardia with pauses/High-grade heart block: Seen by cardiology/electrophysiology and now status post pacemaker.  6.  Right rib pain: Likely related to fall/trauma.  CT chest on admission did not show any rib fractures.  Patient was noted to have episodes of hypoxia during ICU stay.  Currently saturating well on room air.  Continue incentive spirometry.  Will give lidocaine patch to avoid splinting  7.  Coffee-ground emesis: Patient seen by GI in ICU hospital course secondary to intermittent episodes of nausea vomiting/coffee-grounds.  Reglan as needed recommended for diabetic gastroparesis and PPI twice daily.  8.  Diabetes mellitus: Patient now eating and blood glucose up to 300.  On Lantus and sliding scale insulin, will adjust dosage  9.  Acute blood loss anemia: Secondary to problem #1 and problem #7.  Monitor and transfuse for  hemoglobin less than 7  10. 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  DVT prophylaxis: SCDs Code Status: Full code Family / Patient Communication: Discussed with patient and daughter bedside Disposition Plan: Inpatient rehab when insurance approves     LOS: 6 days    Time spent: 35 minutes    Alessandra Bevels, MD [facility] Pager [phone]  If 7PM-7AM, please contact night-coverage [email or web address] Password [facility] 02/13/2019, 3:47 PM

## 2019-02-13 NOTE — TOC Initial Note (Signed)
Transition of Care [facility]) - Initial/Assessment Note    Patient Details  Name: Danielle Clarke MRN: [id number] Date of Birth: 1941/03/12  Transition of [facility]) CM/SW Contact:    Glennon Mac, RN Phone Number: 02/13/2019, 4:58 PM  Clinical Narrative:     78 yo with syncope at dentist office with Left frontal Intraparenchymal hematoma with bil R>L temporal contusions.  PTA, pt independent, lives at home alone.  Daughter is currently here from [place] (she is a Financial controller); she also has a son and daughter in Social worker in [place].  Adult children able to provide assistance at discharge.                Expected Discharge Plan: IP Rehab Facility     Patient Goals and CMS Choice        Expected Discharge Plan and Services Expected Discharge Plan: IP Rehab Facility   Discharge Planning Services: CM Consult   Living arrangements for the past 2 months: Single Family Home                          Prior Living Arrangements/Services Living arrangements for the past 2 months: Single Family Home Lives with:: Self Patient language and need for interpreter reviewed:: No        Need for Family Participation in Patient Care: Yes (Comment) Care giver support system in place?: Yes (comment)(Adult children)   Criminal Activity/Legal Involvement Pertinent to Current Situation/Hospitalization: No - Comment as needed  Activities of Daily Living      Permission Sought/Granted                  Emotional Assessment       Orientation: : Oriented to Self, Oriented to Place, Oriented to  Time, Oriented to Situation Alcohol / Substance Use: Not Applicable Psych Involvement: No (comment)  Admission diagnosis:  Intracranial bleeding (HCC) [I62.9] Patient Active Problem List   Diagnosis Date Noted  . Pacemaker   . Labile blood glucose   . Diabetes mellitus type 2 in nonobese (HCC)   . Dyslipidemia   . Essential hypertension   . Hypernatremia   . Anemia of chronic  disease   . Hypertensive urgency 02/07/2019  . Intracerebral hemorrhage (HCC) 02/07/2019  . Intracranial bleeding (HCC) 02/06/2019  . Elevated troponin   . Abnormal EKG   . Bronchitis 08/05/2017  . Bilateral carotid artery stenosis   . Aortic stenosis 08/25/2015  . Cervical stenosis of spinal canal 07/31/2014  . CKD (chronic kidney disease) stage 2, GFR 60-89 ml/min 12/30/2013  . Essential hypertension, benign 12/30/2013  . Hyperlipidemia 12/30/2013  . Toe infection 12/30/2013  . Anemia 12/30/2013  . Acute renal failure (HCC) 12/29/2013  . Syncope 01/02/2013  . Diabetes mellitus type 1 (HCC) 01/02/2013  . Bilateral carotid artery disease (HCC) 01/02/2013  . GOITER, MULTINODULAR 02/11/2008   PCP:  Juluis Rainier, MD Pharmacy:   [facility] [address], [place] - [address] [place] [address] Phone: [phone] Fax: [phone]     Social Determinants of Health (SDOH) Interventions    Readmission Risk Interventions No flowsheet data found.   Quintella Baton, RN, BSN  Trauma/Neuro ICU Case Manager [phone]

## 2019-02-13 NOTE — Progress Notes (Signed)
  Speech Language Pathology Treatment: Dysphagia;Cognitive-Linquistic  Patient Details Name: Danielle Clarke MRN: [id number] DOB: 11/12/1941 Today's Date: 02/13/2019 Time: 5945-8592 SLP Time Calculation (min) (ACUTE ONLY): 15 min  Assessment / Plan / Recommendation Clinical Impression  Pt visibly fatigued after walking with PT just prior to this session. Agreeable to therapy, sitting in chair and SLP reviewed results of MBS with pt and daughter. Recommendation of full liquids continues to be most efficient/safe given significant deconditioning with exertion of most activity. Daughter reports pt coughed once with liquid but has appeared to tolerate most liquids/oatmeal/soups etc without overt difficulty. Subtle signs aspiration noteably immediate and delayed throat clear (not due to compensatory strategy) with larger consecutive sips mitigated with verbal cues for single small volumes. Moderate verbal reminders needed for 2 swallows and volitional throat clears). She is not yet appropriate to trial higher textures but will continue to follow and make recommendations as pt progresses.   Did not focus much on cognition due to fatigue. She is oriented to place but not situation however able to decode correctly with choice of 3. Continued ST to improve cognitive ability for independence with ADL's.   HPI HPI: Danielle Clarke is a 78 y.o. female with history of diabetes mellitus type 1, asthmatic bronchitis, neck fx 2013, hyperlipidemia was brought to the ER after syncopal episode at the dentist office. Had some difficulty talking. CT head showed left frontal intraparenchymal hematoma and subarachnoid and subdural hematoma in both convexities (small hemorrhagic contusions at the anterior temporal poles bilaterally). Pt had coffee-ground emesis, nause/vomiting which has resolved and GI has signed off. Reported history of syncope ascribed to swallowing and coughing.       SLP Plan  Continue with  current plan of care       Recommendations  Diet recommendations: Thin liquid(full liquids) Liquids provided via: Cup;No straw Medication Administration: Crushed with puree Supervision: Patient able to self feed;Full supervision/cueing for compensatory strategies Compensations: Small sips/bites;Slow rate;Multiple dry swallows after each bite/sip;Clear throat intermittently Postural Changes and/or Swallow Maneuvers: Seated upright 90 degrees                General recommendations: Rehab consult Oral Care Recommendations: Oral care QID Follow up Recommendations: Inpatient Rehab SLP Visit Diagnosis: Dysphagia, oropharyngeal phase (R13.12);Cognitive communication deficit (T24.462) Plan: Continue with current plan of care       GO                Royce Macadamia 02/13/2019, 11:59 AM  Breck Coons Lonell Face.Ed Nurse, children's [phone] Office [phone]

## 2019-02-13 NOTE — Progress Notes (Signed)
Hypoglycemic Event  CBG: 54  Treatment: 8 oz juice/soda  Symptoms: None  Follow-up CBG: Time:0441 CBG Result105  Possible Reasons for Event: other  Comments/MD notified:    [employer]

## 2019-02-13 NOTE — Progress Notes (Signed)
Inpatient Rehab Admissions:  Inpatient Rehab Consult received.  I met with the patient and her daughter, Claudine Mouton, at the bedside for rehabilitation assessment and to discuss goals and expectations of an inpatient rehab admission.  They are interested in a CIR stay following discharge from acute care.  Per MD, patient will likely need 24/7 supervision at discharge from CIR initially.  Daughter is working on Catering manager and I will open insurance today.  Will follow up tomorrow for possible rehab admit pending insurance approval and family arrangements.   Signed: Shann Medal, PT, DPT Admissions Coordinator [phone] 02/13/19  10:29 AM

## 2019-02-13 NOTE — H&P (Signed)
Physical Medicine and Rehabilitation Admission H&P    Chief Complaint  Patient presents with   Loss of Consciousness   Chief complaint:hungry  HPI: Danielle Clarke is a 78 year old right-handed female with history of diabetes mellitus maintained on Tresiba 10 mg daily, hyperlipidemia , hypertension, mild aortic stenosis, carotid artery disease maintained on aspirin 81 mg daily, anterior cervical corpectomy, recurrent syncopal episodes followed by cardiology services. Per chart review patient and daughter, patient lives alone. One level home with 4 steps to entry. Perform all ADLs and driving. She does use a cane when needed. Family in the area works. Presented 02/07/2019 after syncopal episode while at the dental office. Daughter reports that she struck her head. No seizure activity noted. Blood sugars greater than 200. Cranial CT/MRI showed left frontal intraparenchymal hematoma with volume of approximately 10 mL. Posterior right parietal scalp hematoma consistent with contrecoup pattern of injury. No acute fracture. No underlying mass lesion. There was a 4 mm left-to-right midline shift. No hydrocephalus. Neurosurgery Dr. Conchita Paris advise conservative care. Noted blood pressure elevated maintained on nifedipine drip as well as heart rate 100s. Echocardiogram with ejection fraction of 65% normal systolic function. EP follow-up for multiple episodes of intermittent high-grade heart block identified on telemetry followed by cardiology services in the past with troponin negative. 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 Gilman Schmidt. 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 Plan currently is to continue to monitor no plan for endoscopy at this time. No further episodes reported hemoglobin remains stable 11.8. Patient  currently a full liquid diet nasogastric tube in place for nutritional support that was later discontinued. Close monitoring of blood pressure on Lopressor 25 mg twice a day. Therapy evaluations completed with recommendations of physical medicine rehabilitation consult. Patient was admitted for a comprehensive rehabilitation program.  Review of Systems  Constitutional: Negative for chills and fever.  HENT: Negative for hearing loss.   Eyes: Negative for blurred vision and double vision.  Respiratory: Negative for cough and shortness of breath.   Cardiovascular: Negative for chest pain, palpitations and leg swelling.  Gastrointestinal: Positive for constipation. Negative for heartburn, nausea and vomiting.  Musculoskeletal: Positive for falls, joint pain and myalgias.  Skin: Negative for rash.  Neurological: Positive for dizziness and headaches. Negative for seizures and loss of consciousness.  All other systems reviewed and are negative.  Past Medical History:  Diagnosis Date   Aortic stenosis, mild    Arthritis    Asthmatic bronchitis    with colds per patient   Carotid artery disease (HCC)    40-59% bilateral ICA stenosis   Cataract    Cutaneous abscess of right foot    Glaucoma    Heart murmur    Dr Katrinka Blazing is her  cardiologist.    Hyperlipemia    Hypertension    Dr. Katrinka Blazing ~ 2 years ago   Neck fracture [facility])    july 2013   Osteoporosis    Syncope    Type 1 diabetes mellitus [facility])    Past Surgical History:  Procedure Laterality Date   ANKLE FRACTURE SURGERY Left 2001   steel plate and 3 screws    ANTERIOR CERVICAL CORPECTOMY Danielle Clarke 07/31/2014   Procedure: Cervical Four to Cervical Six Corpectomy;  Surgeon: Karn Cassis, MD;  Location: [facility] NEURO ORS;  Service: Neurosurgery;  Laterality: Danielle Clarke;  C4 to C6 Corpectomy  BREAST SURGERY  1988   CATARACT EXTRACTION W/ INTRAOCULAR LENS  IMPLANT, BILATERAL Bilateral 2011   right and then left   EYE SURGERY      FRACTURE SURGERY     HAMMER TOE SURGERY  1998   I&D EXTREMITY Right 09/27/2018   Procedure: IRRIGATION AND DEBRIDEMENT RIGHT FOOT;  Surgeon: Nadara Mustard, MD;  Location: [facility] OR;  Service: Orthopedics;  Laterality: Right;   JOINT REPLACEMENT     PACEMAKER IMPLANT Danielle Clarke 02/10/2019   Procedure: PACEMAKER IMPLANT;  Surgeon: Marinus Maw, MD;  Location: [facility] INVASIVE CV LAB;  Service: Cardiovascular;  Laterality: Danielle Clarke;   TONSILLECTOMY AND ADENOIDECTOMY  1948   TOTAL SHOULDER REPLACEMENT  2010   right shoulder    TUBAL LIGATION  1979   TYMPANOSTOMY TUBE PLACEMENT Bilateral    Family History  Problem Relation Age of Onset   Cancer Mother    Heart Problems Father    Social History:  reports that she has never smoked. She has never used smokeless tobacco. She reports current alcohol use of about 7.0 standard drinks of alcohol per week. No history on file for drug. Allergies:  Allergies  Allergen Reactions   Cefaclor Anaphylaxis   Tetracycline Anaphylaxis   Vancomycin Anaphylaxis   Augmentin [Amoxicillin-Pot Clavulanate] Other (See Comments)    Reaction unknown >> Anaphylaxis Has patient had a PCN reaction causing immediate rash, facial/tongue/throat swelling, SOB or lightheadedness with hypotension: Unknown Has patient had a PCN reaction causing severe rash involving mucus membranes or skin necrosis: Unknown Has patient had a PCN reaction that required hospitalization: Unknown Has patient had a PCN reaction occurring within the last 10 years: Unknown If all of the above answers are "NO", then may proceed with Cephalosporin use.    Prednisone Other (See Comments)    Reaction unknown   Medications Prior to Admission  Medication Sig Dispense Refill   acetaminophen (TYLENOL) 500 MG tablet Take 500 mg by mouth 4 (four) times daily.      aspirin EC 81 MG tablet Take 81 mg by mouth daily.     Chromium Picolinate 1000 MCG TABS Take 1,000 mcg by mouth daily at 12 noon.       CINNAMON PO Take 1,000 mg by mouth 4 (four) times daily.     HUMALOG KWIKPEN 100 UNIT/ML KiwkPen Inject 2-13 Units into the skin 4 (four) times daily. Sliding Scale Insulin  3   Multiple Vitamin (MULTIVITAMIN WITH MINERALS) TABS tablet Take 1 tablet by mouth daily.     Probiotic Product (PROBIOTIC PO) Take 1 tablet by mouth daily.     simvastatin (ZOCOR) 40 MG tablet Take 40 mg by mouth daily at 8 pm.      traMADol (ULTRAM) 50 MG tablet Take 1 tablet (50 mg total) by mouth every 6 (six) hours as needed for moderate pain. 30 tablet 0   TRESIBA 100 UNIT/ML SOLN Inject 10 Units into the skin daily.   6   Turmeric 500 MG CAPS Take 500 mg by mouth daily at 12 noon.      albuterol (PROVENTIL HFA;VENTOLIN HFA) 108 (90 Base) MCG/ACT inhaler Inhale 2 puffs into the lungs every 6 (six) hours as needed for wheezing or shortness of breath. (Patient not taking: Reported on 02/06/2019) 1 Inhaler 2   clindamycin (CLEOCIN) 300 MG capsule Take 1 capsule (300 mg total) by mouth 3 (three) times daily. (Patient not taking: Reported on 02/06/2019) 42 capsule 0   mupirocin ointment (BACTROBAN) 2 % Apply 1  application topically daily. Apply to right foot incision daily (Patient not taking: Reported on 02/06/2019) 22 g 0   ondansetron (ZOFRAN ODT) 4 MG disintegrating tablet Take 1 tablet (4 mg total) by mouth every 8 (eight) hours as needed for nausea or vomiting. (Patient not taking: Reported on 02/06/2019) 20 tablet 0   ondansetron (ZOFRAN) 4 MG tablet Take 1 tablet (4 mg total) by mouth every 8 (eight) hours as needed for nausea or vomiting. (Patient not taking: Reported on 02/06/2019) 20 tablet 0   sulfamethoxazole-trimethoprim (BACTRIM DS,SEPTRA DS) 800-160 MG tablet Take 1 tablet by mouth 2 (two) times daily. (Patient not taking: Reported on 02/06/2019) 6 tablet 0   sulfamethoxazole-trimethoprim (BACTRIM DS,SEPTRA DS) 800-160 MG tablet Take 1 tablet by mouth 2 (two) times daily. (Patient not taking: Reported on  02/06/2019) 40 tablet 0   triamcinolone (KENALOG) 0.025 % ointment Apply 1 application topically 2 (two) times daily. (Patient not taking: Reported on 02/06/2019) 30 g 0    Drug Regimen Review Drug regimen was reviewed and remains appropriate with no significant issues identified  Home: Home Living Family/patient expects to be discharged to:: Private residence Living Arrangements: Alone Available Help at Discharge: Family, Available PRN/intermittently Type of Home: House Home Access: Stairs to enter [employer] of Steps: 2 Entrance Stairs-Rails: Right(daughter reports can easily get handrails and/or ramp built) Home Layout: One level Bathroom Shower/Tub: Engineer, manufacturing systems: Handicapped height Home Equipment: Environmental consultant - 4 wheels, Bedside commode, Cane - quad, Shower seat Additional Comments: daughter is flight attendant with flexible schedule, is working to try and take leave of absence for next month, son and daughter in Hospital doctor in [place] and able to help as well  Lives With: Alone   Functional History: Prior Function Level of Independence: Independent Comments: Performs all ADLs and IADLs including driving. Uses a cane as she wants  Functional Status:  Mobility: Bed Mobility Overal bed mobility: Needs Assistance Bed Mobility: Supine to Sit Rolling: Min assist Sidelying to sit: Min assist, HOB elevated Supine to sit: Min assist General bed mobility comments: assist to elevate trunk and scooting hips forward to square up EOB, pt reported dizziness upon sitting EOB, BP 142/77 Transfers Overall transfer level: Needs assistance Equipment used: Rolling walker (2 wheeled) Transfers: Sit to/from Stand Sit to Stand: Mod assist, +2 physical assistance General transfer comment: x4 trials from bed and recliner, cuing for hand placement, assist to rise, pt required frequent encouragement  Ambulation/Gait Ambulation/Gait assistance: Mod assist, +2  safety/equipment Gait Distance (Feet): 45 Feet Assistive device: Rolling walker (2 wheeled) Gait Pattern/deviations: Step-through pattern, Decreased stride length, Shuffle, Narrow base of support, Trunk flexed General Gait Details: pt very hesitant to ambulate today requiring encouragement, pt reported a fear of leaving the room, pt had frequent complaints of dizziness and fatigue BP 130/80, pt ambulated 5, 15, 45 feet requiring sitting rest breaks in between Gait velocity: decreased    ADL: ADL Overall ADL's : Needs assistance/impaired Eating/Feeding: NPO Grooming: Wash/dry face, Maximal assistance, Sitting Grooming Details (indicate cue type and reason): Max A due to poor following of cues Upper Body Bathing: Moderate assistance, Sitting Lower Body Bathing: Maximal assistance, Sit to/from stand Upper Body Dressing : Moderate assistance, Sitting Lower Body Dressing: Maximal assistance, +2 for safety/equipment, Sit to/from stand Lower Body Dressing Details (indicate cue type and reason): pt able to bring LLE into figure 4 with assist provided to maintain position to don sock, pt able to thread sock partially onto L foot, required assist  to fully bring over heel (fatigued with attemtps) Toilet Transfer: Minimal assistance, Moderate assistance, +2 for safety/equipment, RW, BSC, Stand-pivot Toileting- Clothing Manipulation and Hygiene: Maximal assistance, +2 for physical assistance, +2 for safety/equipment, Sit to/from stand Toileting - Clothing Manipulation Details (indicate cue type and reason): pt noted to have incontinent BM upon standing from EOB, transferred to [facility] for further toileting, required assist for gown management and peri-care Functional mobility during ADLs: Minimal assistance, Moderate assistance, Rolling walker, +2 for safety/equipment General ADL Comments: pt with increased fatigue, weakness, cognitive impairments  Cognition: Cognition Overall Cognitive Status:  Impaired/Different from baseline Arousal/Alertness: Lethargic Orientation Level: Oriented to person, Oriented to place, Oriented to situation Attention: Sustained Sustained Attention: Impaired Sustained Attention Impairment: Verbal basic, Functional basic Memory: (TBA) Awareness: Impaired Awareness Impairment: Emergent impairment, Anticipatory impairment Problem Solving: Impaired Problem Solving Impairment: Functional basic Behaviors: Restless, Impulsive Safety/Judgment: Impaired Cognition Arousal/Alertness: Awake/alert Behavior During Therapy: Flat affect Overall Cognitive Status: Impaired/Different from baseline Area of Impairment: Orientation, Problem solving, Attention, Memory, Following commands, Safety/judgement, Awareness Orientation Level: Disoriented to, Time Current Attention Level: Focused Memory: Decreased recall of precautions, Decreased short-term memory Following Commands: Follows one step commands inconsistently, Follows one step commands with increased time Safety/Judgement: Decreased awareness of deficits, Decreased awareness of safety Awareness: Intellectual Problem Solving: Slow processing, Decreased initiation, Difficulty sequencing, Requires verbal cues General Comments: pt talking more throughout session but complaining more of fatigue, pain and dizziness, required frequent encouragement   Physical Exam: Blood pressure 138/73, pulse (!) 59, temperature 98.1 F (36.7 C), temperature source Oral, resp. rate 18, height 5' (1.524 m), weight 52.2 kg, SpO2 98 %. Physical Exam  Constitutional: She appears well-developed. No distress.  HENT:  Head: Normocephalic.  Eyes: Pupils are equal, round, and reactive to light. EOM are normal.  Neck: Normal range of motion. No tracheal deviation present. No thyromegaly present.  Cardiovascular: Normal rate and regular rhythm. Exam reveals no friction rub.  No murmur heard. Respiratory: Effort normal. No respiratory distress.  She has no wheezes.  GI: Soft. She exhibits no distension. There is no abdominal tenderness. There is no rebound and no guarding.  Musculoskeletal: Normal range of motion.        General: No deformity or edema.  Neurological:  Pt alert, oriented to [facility], knows she fell but doesn't remember anything about incident or immediately after. Fair insight and awareness.  Slow to process. CN without obvious abnl. Tracks to all fields, gaze conjugate, no diplopia. UE 4/5 prox to distal. LE 4- to 4/5 prox to distal. Senses pain and light touch in all 4      Skin: Skin is warm and dry.  Psychiatric:  Pleasant and cooperative. No anxiety or agitation    Results for orders placed or performed during the hospital encounter of 02/06/19 (from the past 48 hour(s))  Glucose, capillary     Status: Abnormal   Collection Time: 02/12/19  8:14 AM  Result Value Ref Range   Glucose-Capillary 197 (H) 70 - 99 mg/dL  Glucose, capillary     Status: Abnormal   Collection Time: 02/12/19 11:40 AM  Result Value Ref Range   Glucose-Capillary 392 (H) 70 - 99 mg/dL  Glucose, capillary     Status: Abnormal   Collection Time: 02/12/19  4:25 PM  Result Value Ref Range   Glucose-Capillary 377 (H) 70 - 99 mg/dL  Glucose, capillary     Status: Abnormal   Collection Time: 02/12/19  7:40 PM  Result Value Ref Range  Glucose-Capillary 272 (H) 70 - 99 mg/dL  Glucose, capillary     Status: Abnormal   Collection Time: 02/12/19 11:23 PM  Result Value Ref Range   Glucose-Capillary 199 (H) 70 - 99 mg/dL  Glucose, capillary     Status: Abnormal   Collection Time: 02/13/19  3:53 AM  Result Value Ref Range   Glucose-Capillary 54 (L) 70 - 99 mg/dL  Glucose, capillary     Status: Abnormal   Collection Time: 02/13/19  4:41 AM  Result Value Ref Range   Glucose-Capillary 105 (H) 70 - 99 mg/dL  Renal function panel     Status: Abnormal   Collection Time: 02/13/19  7:36 AM  Result Value Ref Range   Sodium 141 135 - 145 mmol/L    Potassium 3.5 3.5 - 5.1 mmol/L   Chloride 100 98 - 111 mmol/L   CO2 32 22 - 32 mmol/L   Glucose, Bld 160 (H) 70 - 99 mg/dL   BUN 16 8 - 23 mg/dL   Creatinine, Ser 1.61 0.44 - 1.00 mg/dL   Calcium 8.0 (L) 8.9 - 10.3 mg/dL   Phosphorus 2.0 (L) 2.5 - 4.6 mg/dL   Albumin 2.6 (L) 3.5 - 5.0 g/dL   GFR calc non Af Amer >60 >60 mL/min   GFR calc Af Amer >60 >60 mL/min   Anion gap 9 5 - 15    Comment: Performed at [facility], [address] [place], [place] [address]  Glucose, capillary     Status: Abnormal   Collection Time: 02/13/19  8:09 AM  Result Value Ref Range   Glucose-Capillary 134 (H) 70 - 99 mg/dL  Glucose, capillary     Status: Abnormal   Collection Time: 02/13/19 12:03 PM  Result Value Ref Range   Glucose-Capillary 304 (H) 70 - 99 mg/dL  Glucose, capillary     Status: Abnormal   Collection Time: 02/13/19  3:51 PM  Result Value Ref Range   Glucose-Capillary 205 (H) 70 - 99 mg/dL  Glucose, capillary     Status: Abnormal   Collection Time: 02/13/19  7:45 PM  Result Value Ref Range   Glucose-Capillary 259 (H) 70 - 99 mg/dL  Glucose, capillary     Status: Abnormal   Collection Time: 02/13/19 11:54 PM  Result Value Ref Range   Glucose-Capillary 126 (H) 70 - 99 mg/dL  Basic metabolic panel     Status: Abnormal   Collection Time: 02/14/19  1:43 AM  Result Value Ref Range   Sodium 139 135 - 145 mmol/L   Potassium 3.2 (L) 3.5 - 5.1 mmol/L   Chloride 100 98 - 111 mmol/L   CO2 31 22 - 32 mmol/L   Glucose, Bld 108 (H) 70 - 99 mg/dL   BUN 12 8 - 23 mg/dL   Creatinine, Ser 5.40 0.44 - 1.00 mg/dL   Calcium 8.2 (L) 8.9 - 10.3 mg/dL   GFR calc non Af Amer >60 >60 mL/min   GFR calc Af Amer >60 >60 mL/min   Anion gap 8 5 - 15    Comment: Performed at [facility], [address] [place], [place] [address]  Glucose, capillary     Status: Abnormal   Collection Time: 02/14/19  4:13 AM  Result Value Ref Range   Glucose-Capillary 101 (H) 70 - 99 mg/dL  Glucose,  capillary     Status: Abnormal   Collection Time: 02/14/19  7:31 AM  Result Value Ref Range   Glucose-Capillary 150 (  H) 70 - 99 mg/dL   No results found.     Medical Problem List and Plan: 1.  Decreased functional mobility secondary to traumatic left frontal intraparenchymal hematoma  -admit to inpatient rehab 2.  Antithrombotics: -DVT/anticoagulation:  SCDs  -antiplatelet therapy:  None secondary to intraparenchymal hematoma 3. Pain Management:  Lidoderm patch 4. Mood:  Provide emotional support  -antipsychotic agents:  none 5. Neuropsych: This patient is capable of making decisions on her own behalf. 6. Skin/Wound Care:  Routine skin checks 7. Fluids/Electrolytes/Nutrition: Routine in and out's with follow-up chemistries 8. 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 Follow-up per EP  9. Moderate severe aortic stenosis. Continue Lopressor 25 mg twice a day  -follow for tolerance of physical activity with therapies 10. Dysphagia. Currently on a clear liquid diet. Advance as per speech therapy. 11. Intermittent coffee-ground emesis. 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 Continue PPI.  -Follow-up gastroenterology services currently with conservative care.  -denies pain or discomfort at present 12. Diabetes mellitus with peripheral neuropathy.   -Currently on Lantus insulin 10 units twice daily.   -Check blood sugars before meals and at bedtime.  - Patient had been on Tresiba 10 units daily prior to admission.     Mcarthur RossettiDaniel J Angiulli, PA-C 02/14/2019

## 2019-02-14 ENCOUNTER — Other Ambulatory Visit: Payer: Self-pay

## 2019-02-14 ENCOUNTER — Encounter (HOSPITAL_COMMUNITY): Payer: Self-pay

## 2019-02-14 ENCOUNTER — Inpatient Hospital Stay (HOSPITAL_COMMUNITY)
Admission: RE | Admit: 2019-02-14 | Discharge: 2019-02-28 | DRG: 945 | Disposition: A | Discharge status: Home Health | Payer: Medicare Other | Source: Intra-hospital | Attending: Physical Medicine & Rehabilitation | Admitting: Physical Medicine & Rehabilitation

## 2019-02-14 DIAGNOSIS — W1830XD Fall on same level, unspecified, subsequent encounter: Secondary | ICD-10-CM

## 2019-02-14 DIAGNOSIS — I350 Nonrheumatic aortic (valve) stenosis: Secondary | ICD-10-CM | POA: Diagnosis present

## 2019-02-14 DIAGNOSIS — R5383 Other fatigue: Secondary | ICD-10-CM

## 2019-02-14 DIAGNOSIS — E1042 Type 1 diabetes mellitus with diabetic polyneuropathy: Secondary | ICD-10-CM | POA: Diagnosis present

## 2019-02-14 DIAGNOSIS — S2241XD Multiple fractures of ribs, right side, subsequent encounter for fracture with routine healing: Secondary | ICD-10-CM | POA: Diagnosis not present

## 2019-02-14 DIAGNOSIS — Z8249 Family history of ischemic heart disease and other diseases of the circulatory system: Secondary | ICD-10-CM

## 2019-02-14 DIAGNOSIS — R011 Cardiac murmur, unspecified: Secondary | ICD-10-CM | POA: Diagnosis present

## 2019-02-14 DIAGNOSIS — I169 Hypertensive crisis, unspecified: Secondary | ICD-10-CM | POA: Diagnosis not present

## 2019-02-14 DIAGNOSIS — Z794 Long term (current) use of insulin: Secondary | ICD-10-CM

## 2019-02-14 DIAGNOSIS — S06359D Traumatic hemorrhage of left cerebrum with loss of consciousness of unspecified duration, subsequent encounter: Secondary | ICD-10-CM | POA: Diagnosis not present

## 2019-02-14 DIAGNOSIS — S2241XA Multiple fractures of ribs, right side, initial encounter for closed fracture: Secondary | ICD-10-CM | POA: Diagnosis not present

## 2019-02-14 DIAGNOSIS — E1043 Type 1 diabetes mellitus with diabetic autonomic (poly)neuropathy: Secondary | ICD-10-CM | POA: Diagnosis present

## 2019-02-14 DIAGNOSIS — J90 Pleural effusion, not elsewhere classified: Secondary | ICD-10-CM

## 2019-02-14 DIAGNOSIS — Z6820 Body mass index (BMI) 20.0-20.9, adult: Secondary | ICD-10-CM

## 2019-02-14 DIAGNOSIS — E876 Hypokalemia: Secondary | ICD-10-CM

## 2019-02-14 DIAGNOSIS — I2510 Atherosclerotic heart disease of native coronary artery without angina pectoris: Secondary | ICD-10-CM | POA: Diagnosis present

## 2019-02-14 DIAGNOSIS — E8809 Other disorders of plasma-protein metabolism, not elsewhere classified: Secondary | ICD-10-CM

## 2019-02-14 DIAGNOSIS — Z950 Presence of cardiac pacemaker: Secondary | ICD-10-CM | POA: Diagnosis not present

## 2019-02-14 DIAGNOSIS — S06369A Traumatic hemorrhage of cerebrum, unspecified, with loss of consciousness of unspecified duration, initial encounter: Secondary | ICD-10-CM | POA: Diagnosis present

## 2019-02-14 DIAGNOSIS — E1142 Type 2 diabetes mellitus with diabetic polyneuropathy: Secondary | ICD-10-CM

## 2019-02-14 DIAGNOSIS — S06342S Traumatic hemorrhage of right cerebrum with loss of consciousness of 31 minutes to 59 minutes, sequela: Secondary | ICD-10-CM | POA: Diagnosis not present

## 2019-02-14 DIAGNOSIS — R509 Fever, unspecified: Secondary | ICD-10-CM

## 2019-02-14 DIAGNOSIS — E1065 Type 1 diabetes mellitus with hyperglycemia: Secondary | ICD-10-CM | POA: Diagnosis not present

## 2019-02-14 DIAGNOSIS — S0633AA Contusion and laceration of cerebrum, unspecified, with loss of consciousness status unknown, initial encounter: Secondary | ICD-10-CM | POA: Diagnosis present

## 2019-02-14 DIAGNOSIS — Z809 Family history of malignant neoplasm, unspecified: Secondary | ICD-10-CM

## 2019-02-14 DIAGNOSIS — I10 Essential (primary) hypertension: Secondary | ICD-10-CM | POA: Diagnosis present

## 2019-02-14 DIAGNOSIS — E10649 Type 1 diabetes mellitus with hypoglycemia without coma: Secondary | ICD-10-CM | POA: Diagnosis not present

## 2019-02-14 DIAGNOSIS — Z881 Allergy status to other antibiotic agents status: Secondary | ICD-10-CM

## 2019-02-14 DIAGNOSIS — R1310 Dysphagia, unspecified: Secondary | ICD-10-CM

## 2019-02-14 DIAGNOSIS — J45909 Unspecified asthma, uncomplicated: Secondary | ICD-10-CM | POA: Diagnosis present

## 2019-02-14 DIAGNOSIS — K2951 Unspecified chronic gastritis with bleeding: Secondary | ICD-10-CM | POA: Diagnosis not present

## 2019-02-14 DIAGNOSIS — Z888 Allergy status to other drugs, medicaments and biological substances status: Secondary | ICD-10-CM

## 2019-02-14 DIAGNOSIS — M1990 Unspecified osteoarthritis, unspecified site: Secondary | ICD-10-CM | POA: Diagnosis present

## 2019-02-14 DIAGNOSIS — H409 Unspecified glaucoma: Secondary | ICD-10-CM | POA: Diagnosis present

## 2019-02-14 DIAGNOSIS — R0603 Acute respiratory distress: Secondary | ICD-10-CM

## 2019-02-14 DIAGNOSIS — S06340D Traumatic hemorrhage of right cerebrum without loss of consciousness, subsequent encounter: Secondary | ICD-10-CM | POA: Diagnosis not present

## 2019-02-14 DIAGNOSIS — E162 Hypoglycemia, unspecified: Secondary | ICD-10-CM | POA: Diagnosis not present

## 2019-02-14 DIAGNOSIS — D62 Acute posthemorrhagic anemia: Secondary | ICD-10-CM

## 2019-02-14 DIAGNOSIS — R0789 Other chest pain: Secondary | ICD-10-CM | POA: Diagnosis not present

## 2019-02-14 DIAGNOSIS — S06340S Traumatic hemorrhage of right cerebrum without loss of consciousness, sequela: Secondary | ICD-10-CM

## 2019-02-14 DIAGNOSIS — I459 Conduction disorder, unspecified: Secondary | ICD-10-CM | POA: Diagnosis present

## 2019-02-14 DIAGNOSIS — S06350S Traumatic hemorrhage of left cerebrum without loss of consciousness, sequela: Secondary | ICD-10-CM | POA: Diagnosis not present

## 2019-02-14 DIAGNOSIS — N390 Urinary tract infection, site not specified: Secondary | ICD-10-CM | POA: Diagnosis not present

## 2019-02-14 DIAGNOSIS — R0989 Other specified symptoms and signs involving the circulatory and respiratory systems: Secondary | ICD-10-CM | POA: Diagnosis not present

## 2019-02-14 DIAGNOSIS — R7309 Other abnormal glucose: Secondary | ICD-10-CM | POA: Diagnosis not present

## 2019-02-14 DIAGNOSIS — S06310S Contusion and laceration of right cerebrum without loss of consciousness, sequela: Secondary | ICD-10-CM

## 2019-02-14 DIAGNOSIS — Z9660 Presence of unspecified orthopedic joint implant: Secondary | ICD-10-CM | POA: Diagnosis present

## 2019-02-14 DIAGNOSIS — R7989 Other specified abnormal findings of blood chemistry: Secondary | ICD-10-CM | POA: Diagnosis not present

## 2019-02-14 DIAGNOSIS — S062X9S Diffuse traumatic brain injury with loss of consciousness of unspecified duration, sequela: Secondary | ICD-10-CM | POA: Diagnosis present

## 2019-02-14 DIAGNOSIS — E785 Hyperlipidemia, unspecified: Secondary | ICD-10-CM | POA: Diagnosis present

## 2019-02-14 DIAGNOSIS — E1165 Type 2 diabetes mellitus with hyperglycemia: Secondary | ICD-10-CM | POA: Diagnosis not present

## 2019-02-14 DIAGNOSIS — R079 Chest pain, unspecified: Secondary | ICD-10-CM

## 2019-02-14 DIAGNOSIS — M810 Age-related osteoporosis without current pathological fracture: Secondary | ICD-10-CM | POA: Diagnosis present

## 2019-02-14 DIAGNOSIS — E1149 Type 2 diabetes mellitus with other diabetic neurological complication: Secondary | ICD-10-CM | POA: Diagnosis not present

## 2019-02-14 DIAGNOSIS — R4701 Aphasia: Secondary | ICD-10-CM | POA: Diagnosis present

## 2019-02-14 DIAGNOSIS — N179 Acute kidney failure, unspecified: Secondary | ICD-10-CM | POA: Diagnosis not present

## 2019-02-14 DIAGNOSIS — R55 Syncope and collapse: Secondary | ICD-10-CM | POA: Diagnosis not present

## 2019-02-14 DIAGNOSIS — Z7982 Long term (current) use of aspirin: Secondary | ICD-10-CM

## 2019-02-14 DIAGNOSIS — K3184 Gastroparesis: Secondary | ICD-10-CM | POA: Diagnosis present

## 2019-02-14 DIAGNOSIS — D72829 Elevated white blood cell count, unspecified: Secondary | ICD-10-CM

## 2019-02-14 DIAGNOSIS — E46 Unspecified protein-calorie malnutrition: Secondary | ICD-10-CM | POA: Diagnosis present

## 2019-02-14 LAB — BASIC METABOLIC PANEL
Anion gap: 8 (ref 5–15)
BUN: 12 mg/dL (ref 8–23)
CO2: 31 mmol/L (ref 22–32)
Calcium: 8.2 mg/dL — ABNORMAL LOW (ref 8.9–10.3)
Chloride: 100 mmol/L (ref 98–111)
Creatinine, Ser: 0.64 mg/dL (ref 0.44–1.00)
GFR calc Af Amer: >60 mL/min (ref >60)
GFR calc non Af Amer: >60 mL/min (ref >60)
Glucose, Bld: 108 mg/dL — ABNORMAL HIGH (ref 70–99)
Potassium: 3.2 mmol/L — ABNORMAL LOW (ref 3.5–5.1)
Sodium: 139 mmol/L (ref 135–145)

## 2019-02-14 LAB — GLUCOSE, RANDOM: Glucose, Bld: 540 mg/dL (ref 70–99)

## 2019-02-14 LAB — GLUCOSE, CAPILLARY
Glucose-Capillary: 101 mg/dL — ABNORMAL HIGH (ref 70–99)
Glucose-Capillary: 150 mg/dL — ABNORMAL HIGH (ref 70–99)
Glucose-Capillary: 446 mg/dL — ABNORMAL HIGH (ref 70–99)
Glucose-Capillary: 449 mg/dL — ABNORMAL HIGH (ref 70–99)
Glucose-Capillary: 397 mg/dL — ABNORMAL HIGH (ref 70–99)
Glucose-Capillary: 198 mg/dL — ABNORMAL HIGH (ref 70–99)
Glucose-Capillary: 234 mg/dL — ABNORMAL HIGH (ref 70–99)

## 2019-02-14 MED ORDER — AMLODIPINE BESYLATE 2.5 MG PO TABS: 2.5000 mg | ORAL_TABLET | Freq: Every day | ORAL | Status: DC

## 2019-02-14 MED ORDER — PANTOPRAZOLE SODIUM 40 MG PO PACK
40.0000 mg | PACK | Freq: Two times a day (BID) | ORAL | Status: DC
  Administered 2019-02-14 – 2019-02-28 (×28): 40 mg via ORAL
  Filled 2019-02-14 (×28): qty 20

## 2019-02-14 MED ORDER — LIDOCAINE 5 % EX PTCH
1.0000 | MEDICATED_PATCH | CUTANEOUS | Status: DC
  Administered 2019-02-14 – 2019-02-15 (×2): 1 via TRANSDERMAL
  Filled 2019-02-14 (×2): qty 1

## 2019-02-14 MED ORDER — ACETAMINOPHEN 325 MG PO TABS
650.0000 mg | ORAL_TABLET | Freq: Four times a day (QID) | ORAL | Status: DC | PRN
  Administered 2019-02-15 – 2019-02-22 (×7): 650 mg via ORAL
  Filled 2019-02-14 (×8): qty 2

## 2019-02-14 MED ORDER — PANTOPRAZOLE SODIUM 40 MG PO PACK
40.0000 mg | PACK | Freq: Two times a day (BID) | ORAL | Status: DC
  Administered 2019-02-14: 40 mg via ORAL

## 2019-02-14 MED ORDER — LIVING WELL WITH DIABETES BOOK: Freq: Once | Status: DC

## 2019-02-14 MED ORDER — LIDOCAINE 5 % EX PTCH: 1.0000 | MEDICATED_PATCH | CUTANEOUS | 0 refills | Status: DC

## 2019-02-14 MED ORDER — BLOOD PRESSURE CONTROL BOOK: Freq: Once | Status: DC

## 2019-02-14 MED ORDER — INSULIN GLARGINE 100 UNIT/ML ~~LOC~~ SOLN
10.0000 [IU] | Freq: Two times a day (BID) | SUBCUTANEOUS | Status: DC
  Administered 2019-02-14 – 2019-02-17 (×7): 10 [IU] via SUBCUTANEOUS
  Filled 2019-02-14 (×9): qty 0.1

## 2019-02-14 MED ORDER — METOPROLOL TARTRATE 25 MG PO TABS: 25.0000 mg | ORAL_TABLET | Freq: Two times a day (BID) | ORAL | Status: DC

## 2019-02-14 MED ORDER — INSULIN GLARGINE 100 UNIT/ML ~~LOC~~ SOLN: 10.0000 [IU] | Freq: Two times a day (BID) | SUBCUTANEOUS | 11 refills | Status: DC

## 2019-02-14 MED ORDER — POTASSIUM CHLORIDE 20 MEQ PO PACK
40.0000 meq | PACK | Freq: Once | ORAL | Status: AC
  Administered 2019-02-14: 40 meq via ORAL
  Filled 2019-02-14: qty 2

## 2019-02-14 MED ORDER — GLUCERNA SHAKE PO LIQD: 237.0000 mL | Freq: Three times a day (TID) | ORAL | 0 refills | Status: DC

## 2019-02-14 MED ORDER — METOPROLOL TARTRATE 25 MG PO TABS
25.0000 mg | ORAL_TABLET | Freq: Two times a day (BID) | ORAL | Status: DC
  Administered 2019-02-14 – 2019-02-27 (×25): 25 mg via ORAL
  Filled 2019-02-14 (×28): qty 1

## 2019-02-14 MED ORDER — GLUCERNA SHAKE PO LIQD
237.0000 mL | Freq: Three times a day (TID) | ORAL | Status: DC
  Administered 2019-02-14 – 2019-02-28 (×37): 237 mL via ORAL
  Filled 2019-02-14 (×2): qty 237

## 2019-02-14 MED ORDER — INSULIN ASPART 100 UNIT/ML ~~LOC~~ SOLN: 0.0000 [IU] | SUBCUTANEOUS | 11 refills | Status: DC

## 2019-02-14 MED ORDER — INSULIN ASPART 100 UNIT/ML ~~LOC~~ SOLN
0.0000 [IU] | SUBCUTANEOUS | Status: DC
  Administered 2019-02-14: 5 [IU] via SUBCUTANEOUS
  Administered 2019-02-14 – 2019-02-15 (×2): 3 [IU] via SUBCUTANEOUS
  Administered 2019-02-15: 2 [IU] via SUBCUTANEOUS
  Administered 2019-02-15: 11 [IU] via SUBCUTANEOUS
  Administered 2019-02-15: 8 [IU] via SUBCUTANEOUS
  Administered 2019-02-16: 11 [IU] via SUBCUTANEOUS
  Administered 2019-02-16: 5 [IU] via SUBCUTANEOUS
  Administered 2019-02-16 (×2): 3 [IU] via SUBCUTANEOUS
  Administered 2019-02-16: 2 [IU] via SUBCUTANEOUS
  Administered 2019-02-17: 15 [IU] via SUBCUTANEOUS
  Administered 2019-02-17: 11 [IU] via SUBCUTANEOUS
  Administered 2019-02-17: 3 [IU] via SUBCUTANEOUS
  Administered 2019-02-17: 5 [IU] via SUBCUTANEOUS
  Administered 2019-02-18: 2 [IU] via SUBCUTANEOUS
  Administered 2019-02-18 (×2): 3 [IU] via SUBCUTANEOUS
  Administered 2019-02-18: 11 [IU] via SUBCUTANEOUS
  Administered 2019-02-18: 5 [IU] via SUBCUTANEOUS
  Administered 2019-02-19: 12 [IU] via SUBCUTANEOUS
  Administered 2019-02-19: 5 [IU] via SUBCUTANEOUS
  Administered 2019-02-19: 8 [IU] via SUBCUTANEOUS
  Administered 2019-02-19: 11 [IU] via SUBCUTANEOUS
  Administered 2019-02-19: 5 [IU] via SUBCUTANEOUS
  Administered 2019-02-20 (×2): 8 [IU] via SUBCUTANEOUS
  Administered 2019-02-20: 11 [IU] via SUBCUTANEOUS
  Administered 2019-02-20 (×2): 8 [IU] via SUBCUTANEOUS
  Administered 2019-02-21: 1 [IU] via SUBCUTANEOUS
  Administered 2019-02-21 (×2): 5 [IU] via SUBCUTANEOUS
  Administered 2019-02-21 (×2): 11 [IU] via SUBCUTANEOUS
  Administered 2019-02-21 – 2019-02-22 (×2): 2 [IU] via SUBCUTANEOUS
  Administered 2019-02-22: 8 [IU] via SUBCUTANEOUS
  Administered 2019-02-22: 20 [IU] via SUBCUTANEOUS
  Administered 2019-02-23: 2 [IU] via SUBCUTANEOUS
  Administered 2019-02-23: 11 [IU] via SUBCUTANEOUS
  Administered 2019-02-23: 8 [IU] via SUBCUTANEOUS
  Administered 2019-02-23: 2 [IU] via SUBCUTANEOUS
  Administered 2019-02-23 – 2019-02-24 (×2): 5 [IU] via SUBCUTANEOUS
  Administered 2019-02-24 (×2): 3 [IU] via SUBCUTANEOUS
  Administered 2019-02-24: 15 [IU] via SUBCUTANEOUS
  Administered 2019-02-25 (×2): 3 [IU] via SUBCUTANEOUS
  Administered 2019-02-25: 5 [IU] via SUBCUTANEOUS
  Administered 2019-02-25: 3 [IU] via SUBCUTANEOUS
  Administered 2019-02-26: 15 [IU] via SUBCUTANEOUS
  Administered 2019-02-26: 01:00:00 3 [IU] via SUBCUTANEOUS

## 2019-02-14 MED ORDER — PANTOPRAZOLE SODIUM 40 MG PO PACK: 40.0000 mg | PACK | Freq: Two times a day (BID) | ORAL | Status: DC

## 2019-02-14 NOTE — TOC Transition Note (Signed)
Transition of Care [facility]) - CM/SW Discharge Note   Patient Details  Name: Danielle Clarke MRN: [id number] Date of Birth: Aug 11, 1941  Transition of Care [facility] CM/SW Contact:  Glennon Mac, RN Phone Number:  [phone] 02/14/2019, 1:42 PM   Clinical Narrative:  78 yo with syncope at dentist office with Left frontal Intraparenchymal hematoma with bil R>L temporal contusions. s/p pacer placement 3/16.  PT/OT recommending CIR, and pt medically stable for discharge, and has been approved for admission to inpatient rehab.       Final next level of care: IP Rehab Facility     Patient Goals and CMS Choice        Discharge Placement                       Discharge Plan and Services   Discharge Planning Services: CM Consult                      Social Determinants of Health (SDOH) Interventions     Readmission Risk Interventions No flowsheet data found.  Quintella Baton, RN, BSN  Trauma/Neuro ICU Case Manager [phone]

## 2019-02-14 NOTE — Care Management Important Message (Signed)
Important Message  Patient Details  Name: Danielle Clarke MRN: [id number] Date of Birth: 1941-06-07   Medicare Important Message Given:  Yes    Dorena Bodo 02/14/2019, 3:36 PM

## 2019-02-14 NOTE — Discharge Summary (Addendum)
Physician Discharge Summary  Danielle Clarke ZOX:[id number] DOB: 10/15/1941 DOA: 02/06/2019  PCP: Juluis RainierBarnes, [name], MD  Admit date: 02/06/2019 Discharge date: 02/14/2019 Consultations: Neurosurgery Dr Derryl Harborostella, PCCM, Cardiology Dr SwazilandJordan, GI Dr Ewing SchleinMagod Admitted From: home Disposition:  Inpatient rehab  Discharge Diagnoses:  Principal Problem:   Intracranial bleeding [facility](HCC) Active Problems:   Syncope   Diabetes mellitus type 1 (HCC)   CKD (chronic kidney disease) stage 2, GFR 60-89 ml/min   Essential hypertension, benign   Anemia   Hypertensive urgency   Intracerebral hemorrhage (HCC)   Pacemaker   Labile blood glucose   Diabetes mellitus type 2 in nonobese (HCC)   Dyslipidemia   Essential hypertension   Hypernatremia   Anemia of chronic disease   Moderate aortic stenosis   Acute on Chronic diastolic CHF  Brief Summary: 78 year old female with history of hypertension,Hyperlipidemia, chronic kidney disease stage III, carotid artery disease, aortic stenosis, glaucoma, diabetes mellitus, recurrent syncopal episodes presented to ED on 3/13 after she had a syncope related fall at the dentist office resulting in ICH. On arrival in ER, patient had difficulty talking, confusion but remained non focal.Patient has been hypertensive since arrival. CT/MRI head showing left frontal intraparenchymal hematoma with small volume mixed SAH/ SDH over bilateral convexities left greater than right. Posterior right parietal scalp hematoma consistent with contrecoup pattern of injury. Other trauma work-up including CT abdomen pelvis/CT cervical spine/CT chest were negative.  Neurosurgery was consulted who recommended conservative nonoperative management.On March 13 patient had nausea/vomiting with head imaging showing cerebral edema with mass-effect associated with bradycardia and pauses.  Patient was transferred to ICU for strict blood pressure control, was also seen by cardiology and underwent pacemaker  placement on March 16.  Speech therapy has been following and patient deemed to be high aspiration risk requiring NG tube on 3/16.  She is now off NG tube and on a modified diet. She complained of right rib pain with coughing/position changes. No rib fractures on CT chest but has reproducible pain in this area.  1.  Intracranial hemorrhage: Secondary to fall/problem #2.  Seen by neurosurgery and recommended conservative management. She was on asa at home which has been held.  Repeat CT head on March 17 shows overall decrease in size of the left frontal intracranial bleed area but somewhat increased surrounding edema/midline shift.  Discussed with neurosurgery APP who recommended to follow clinically as intracranial findings may look worse up to 72 hours before improving.  Repeat imaging recommended if there is any neurological decline on clinical exam.  Speech therapy following for aphasia/dysphagia.  PT/OT and  Neurochecks every shift in inpatient rehab as d/w rehab APP, Dan.   2.  Recurrent syncope: Patient has history of swallowing/cough syncope. Symptomatic bradycardia likely a consequence of problem #1 rather than cause of problem #2.  Patient now status post pacemaker.   3. Hypertension: SBP 130 to 160s. Continue metoprolol BID (now s/p pacemaker). Add low dose Norvasc for optimal BP control. Required antihypertensive drips in ICU course for tight BP control.   4.  Hypernatremia: Improved with hypotonic fluids. D/C dextrose fluids and now on liquid diet.  Continue to monitor BMP periodically to avoid significant fluctuations in sodium level given CT head findings and risk of AMS.   5.  Symptomatic bradycardia with pauses/High-grade heart block: Seen by cardiology/electrophysiology and now status post pacemaker.  6.  Right rib pain: Likely related to fall/trauma.  CT chest on admission did not show any rib fractures.  Patient was noted  to have episodes of hypoxia during ICU stay.  Currently  saturating well on room air.  Continue incentive spirometry. Prescribed lidocaine patch to avoid splinting, seems to be helping  7.  Coffee-ground emesis: Patient seen by [facility] Spr[facility] GI in ICU hospital course secondary to intermittent episodes of nausea vomiting/coffee-grounds.  Reglan as needed recommended for diabetic gastroparesis and PPI twice daily x4 weeks .No plan for endoscopy in absence of life-threatening GI bleeding per last note by Dr Dulce Sellar.  8.  Diabetes mellitus: BG improved with increased Lantus dosing to 10 mg BID but she had hyperglycemic episode this afternoon likely in the setting of non diabetic liquid diet and dextrose fluids. Covered with SSI and d/ced dextrose fluids. Further insulin adjustments per BG monitoring in rehab and d/w rehab APP in detail.   9.  Acute blood loss anemia: Secondary to problem #1 and problem #7.  Monitor and transfuse for hemoglobin less than 7  10.Dysphagia: Seen by speech for follow-up and cleared for liquid diet.  Appreciate their evaluation and follow-up.  Can advance as tolerated in rehab.  Now off NG tube.   11.Acute on Chronic diastolic CHF/moderate aortic stenosis: received lasix in earlier hospital course and CXR 3/17 showed mild -moderate pleural effusions. Now off IV fluids.  Consider repeat CXR/diuretics if develops shortness of breath or hypoxic in rehab.   13. 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  14. Mild hypokalemia: replace. Labs in am    DVT prophylaxis: SCDs given #1 Code Status: Full code Disposition Plan: Inpatient rehab today    Discharge Exam: Vitals:   02/14/19 0933 02/14/19 1120  BP:  (!) 130/51  Pulse: 89 81  Resp:  18  Temp:  97.6 F (36.4 C)  SpO2:  95%   Vitals:   02/14/19 0720 02/14/19 0800 02/14/19 0933 02/14/19 1120  BP: 138/73 (!) 160/73  (!) 130/51  Pulse: (!) 59  89 81  Resp: 18   18  Temp: 98.1 F (36.7 C)   97.6 F (36.4 C)  TempSrc: Oral   Oral  SpO2: 98%   95%  Weight:      Height:         General: Pt is alert, awake, not in acute distress Cardiovascular: RRR, S1/S2 +, no rubs, no gallops Respiratory: Distant breath sounds. CTA bilaterally, no wheezing, no rhonchi Abdominal: Soft, NT, ND, bowel sounds + Extremities: no edema, no cyanosis Neuro: speech improving. Non focal otherwise  Discharge Instructions  Discharge Instructions    Discharge diet:   Complete by:  As directed    Liquid diet, carb controlled and advance as recommended by Speech therapy on follow up   Increase activity slowly   Complete by:  As directed      Allergies as of 02/14/2019      Reactions   Cefaclor Anaphylaxis   Tetracycline Anaphylaxis   Vancomycin Anaphylaxis   Augmentin [amoxicillin-pot Clavulanate] Other (See Comments)   Reaction unknown >> Anaphylaxis Has patient had a PCN reaction causing immediate rash, facial/tongue/throat swelling, SOB or lightheadedness with hypotension: Unknown Has patient had a PCN reaction causing severe rash involving mucus membranes or skin necrosis: Unknown Has patient had a PCN reaction that required hospitalization: Unknown Has patient had a PCN reaction occurring within the last 10 years: Unknown If all of the above answers are "NO", then may proceed with Cephalosporin use.   Prednisone Other (See Comments)   Reaction unknown      Medication List    STOP taking  these medications   aspirin EC 81 MG tablet   Chromium Picolinate 1000 MCG Tabs   CINNAMON PO   clindamycin 300 MG capsule Commonly known as:  Cleocin   HumaLOG KwikPen 100 UNIT/ML KwikPen Generic drug:  insulin lispro   mupirocin ointment 2 % Commonly known as:  Bactroban   ondansetron 4 MG tablet Commonly known as:  Zofran   sulfamethoxazole-trimethoprim 800-160 MG tablet Commonly known as:  BACTRIM DS,SEPTRA DS   traMADol 50 MG tablet Commonly known as:  ULTRAM   Tresiba 100 UNIT/ML Soln Generic drug:  Insulin Degludec   triamcinolone 0.025 % ointment Commonly  known as:  KENALOG   Turmeric 500 MG Caps     TAKE these medications   acetaminophen 500 MG tablet Commonly known as:  TYLENOL Take 500 mg by mouth 4 (four) times daily.   albuterol 108 (90 Base) MCG/ACT inhaler Commonly known as:  PROVENTIL HFA;VENTOLIN HFA Inhale 2 puffs into the lungs every 6 (six) hours as needed for wheezing or shortness of breath.   amLODipine 2.5 MG tablet Commonly known as:  NORVASC Take 1 tablet (2.5 mg total) by mouth daily.   feeding supplement (GLUCERNA SHAKE) Liqd Take 237 mLs by mouth 3 (three) times daily between meals.   insulin aspart 100 UNIT/ML injection Commonly known as:  novoLOG Inject 0-15 Units into the skin every 4 (four) hours.   insulin glargine 100 UNIT/ML injection Commonly known as:  LANTUS Inject 0.1 mLs (10 Units total) into the skin 2 (two) times daily.   lidocaine 5 % Commonly known as:  LIDODERM Place 1 patch onto the skin daily. Remove & Discard patch within 12 hours or as directed by MD   metoprolol tartrate 25 MG tablet Commonly known as:  LOPRESSOR Take 1 tablet (25 mg total) by mouth 2 (two) times daily.   multivitamin with minerals Tabs tablet Take 1 tablet by mouth daily.   ondansetron 4 MG disintegrating tablet Commonly known as:  Zofran ODT Take 1 tablet (4 mg total) by mouth every 8 (eight) hours as needed for nausea or vomiting.   pantoprazole sodium 40 mg/20 mL Pack Commonly known as:  PROTONIX Take 20 mLs (40 mg total) by mouth 2 (two) times daily.   PROBIOTIC PO Take 1 tablet by mouth daily.   simvastatin 40 MG tablet Commonly known as:  ZOCOR Take 40 mg by mouth daily at 8 pm.      Follow-up Information    Lisbeth Renshaw, MD Follow up in 3 week(s).   Specialty:  Neurosurgery Contact information: [address] [place] [address] [phone]        [facility] Follow up.   Specialty:  Cardiology Contact information: [address]  [address] [place] [address] [phone]       Marinus Maw, MD .   Specialty:  Cardiology Contact information: [phone] [address] [place] [address] [phone]        Wyona Almas, CRNA .   Specialty:  Anesthesiology Contact information: [address] [place] [address] [phone]          Allergies  Allergen Reactions  . Cefaclor Anaphylaxis  . Tetracycline Anaphylaxis  . Vancomycin Anaphylaxis  . Augmentin [Amoxicillin-Pot Clavulanate] Other (See Comments)    Reaction unknown >> Anaphylaxis Has patient had a PCN reaction causing immediate rash, facial/tongue/throat swelling, SOB or lightheadedness with hypotension: Unknown Has patient had a PCN  reaction causing severe rash involving mucus membranes or skin necrosis: Unknown Has patient had a PCN reaction that required hospitalization: Unknown Has patient had a PCN reaction occurring within the last 10 years: Unknown If all of the above answers are "NO", then may proceed with Cephalosporin use.   . Prednisone Other (See Comments)    Reaction unknown       Discharge Condition: stable CODE STATUS: Full code Diet recommendation: Liquid diet (carb controlled) , advance per ST Recommendations for Outpatient Follow-up:  1. Follow up with PCP in 1-2 weeks 2. Please obtain BMP in 3 days for sodium level/creatinine/BG level follow up 3. Please follow up on the following pending results: Diet recommendations by Speech, insulin adjustments      The results of significant diagnostics from this hospitalization (including imaging, microbiology, ancillary and laboratory) are listed below for reference.     Microbiology: Recent Results (from the past 240 hour(s))  MRSA PCR Screening     Status: None   Collection Time: 02/07/19  3:07 AM  Result Value Ref Range Status   MRSA by PCR NEGATIVE NEGATIVE Final    Comment:        The GeneXpert MRSA Assay  (FDA approved for NASAL specimens only), is one component of a comprehensive MRSA colonization surveillance program. It is not intended to diagnose MRSA infection nor to guide or monitor treatment for MRSA infections. Performed at [facility], [address] [place], [place] [address]   Surgical PCR screen     Status: None   Collection Time: 02/10/19  6:31 AM  Result Value Ref Range Status   MRSA, PCR NEGATIVE NEGATIVE Final   Staphylococcus aureus NEGATIVE NEGATIVE Final    Comment: (NOTE) The Xpert SA Assay (FDA approved for NASAL specimens in patients 7 years of age and older), is one component of a comprehensive surveillance program. It is not intended to diagnose infection nor to guide or monitor treatment. Performed at [facility], [address] [place], [place] [address]      Labs: BNP (last 3 results) No results for input(s): BNP in the last 8760 hours. Basic Metabolic Panel: Recent Labs  Lab 02/10/19 0540 02/10/19 1932 02/11/19 0411 02/12/19 0358 02/13/19 0736 02/14/19 0143 02/14/19 1147  NA 148* 151* 153* 158* 141 139  --   K 2.5* 4.1 3.2* 3.5 3.5 3.2*  --   CL 107 111 113* 113* 100 100  --   CO2 37* 32 31  --   GLUCOSE 154* 309* 261* 75 160* 108* 540*  BUN 29* 36* 35* 31* 16 12  --   CREATININE 0.92 0.93 0.93 0.73 0.78 0.64  --   CALCIUM 8.5* 8.5* 8.8* 8.9 8.0* 8.2*  --   MG 2.1  --  2.4  --   --   --   --   PHOS  --   --  2.1*  --  2.0*  --   --    Liver Function Tests: Recent Labs  Lab 02/08/19 1144 02/09/19 0624 02/10/19 0540 02/12/19 0358 02/13/19 0736  AST --   ALT --   ALKPHOS 62 53 65 58  --   BILITOT 0.7 0.8 1.1 0.6  --   PROT 6.2* 6.3* 6.5 5.9*  --   ALBUMIN 3.5 3.2* 3.1* 2.8* 2.6*   No results for input(s): LIPASE, AMYLASE in the last 168 hours. No results  for input(s): AMMONIA in the last 168 hours. CBC: Recent Labs  Lab 02/09/19 0624 02/10/19 0540 02/11/19 0411  02/12/19 0358  WBC 20.2* 15.1* 7.9 7.8  HGB 10.7* 10.8* 11.8* 11.8*  HCT 33.8* 32.5* 36.4 37.6  MCV 93.6 91.5 93.3 93.1  PLT 276 265 278 242   Cardiac Enzymes: Recent Labs  Lab 02/07/19 1403  TROPONINI <0.03   BNP: Invalid input(s): POCBNP CBG: Recent Labs  Lab 02/13/19 2354 02/14/19 0413 02/14/19 0731 02/14/19 1118 02/14/19 1300  GLUCAP 126* 101* 150* 446* 449*   D-Dimer No results for input(s): DDIMER in the last 72 hours. Hgb A1c No results for input(s): HGBA1C in the last 72 hours. Lipid Profile No results for input(s): CHOL, HDL, LDLCALC, TRIG, CHOLHDL, LDLDIRECT in the last 72 hours. Thyroid function studies No results for input(s): TSH, T4TOTAL, T3FREE, THYROIDAB in the last 72 hours.  Invalid input(s): FREET3 Anemia work up Recent Labs    02/12/19 0358  VITAMINB12 2,855*  FOLATE 27.4  FERRITIN 87  TIBC 244*  IRON 46  RETICCTPCT 1.2   Urinalysis    Component Value Date/Time   COLORURINE YELLOW 02/07/2019 1722   APPEARANCEUR CLEAR 02/07/2019 1722   LABSPEC 1.021 02/07/2019 1722   PHURINE 8.0 02/07/2019 1722   GLUCOSEU >=500 (A) 02/07/2019 1722   HGBUR NEGATIVE 02/07/2019 1722   BILIRUBINUR NEGATIVE 02/07/2019 1722   KETONESUR 20 (A) 02/07/2019 1722   PROTEINUR NEGATIVE 02/07/2019 1722   UROBILINOGEN 0.2 10/07/2014 1808   NITRITE NEGATIVE 02/07/2019 1722   LEUKOCYTESUR NEGATIVE 02/07/2019 1722   Sepsis Labs Invalid input(s): PROCALCITONIN,  WBC,  LACTICIDVEN Microbiology Recent Results (from the past 240 hour(s))  MRSA PCR Screening     Status: None   Collection Time: 02/07/19  3:07 AM  Result Value Ref Range Status   MRSA by PCR NEGATIVE NEGATIVE Final    Comment:        The GeneXpert MRSA Assay (FDA approved for NASAL specimens only), is one component of a comprehensive MRSA colonization surveillance program. It is not intended to diagnose MRSA infection nor to guide or monitor treatment for MRSA infections. Performed at [facility], [address], [place], [place] [address]   Surgical PCR screen     Status: None   Collection Time: 02/10/19  6:31 AM  Result Value Ref Range Status   MRSA, PCR NEGATIVE NEGATIVE Final   Staphylococcus aureus NEGATIVE NEGATIVE Final    Comment: (NOTE) The Xpert SA Assay (FDA approved for NASAL specimens in patients 22 years of age and older), is one component of a comprehensive surveillance program. It is not intended to diagnose infection nor to guide or monitor treatment. Performed at [facility], [address] [place], [place] [address]     Procedures/Studies: Dg Chest 2 View  Result Date: 02/11/2019 CLINICAL DATA:  Pacemaker placement EXAM: CHEST - 2 VIEW COMPARISON:  Three days ago FINDINGS: Interval dual-chamber pacer from the left with leads over the right atrial appendage and right ventricle. Increased hazy density of the inferior chest attributed to small to moderate pleural effusions. No Kerley lines. No pneumothorax. Normal heart size. A feeding tube at least reaches the stomach. IMPRESSION: 1. New dual-chamber pacer without complicating feature. 2. New small to moderate pleural effusions. Electronically Signed   By: Marnee Spring M.D.   On: 02/11/2019 06:23   Ct Head Wo Contrast  Result Date: 02/11/2019 CLINICAL DATA:  Follow-up examination for intracranial hemorrhage. EXAM: CT HEAD WITHOUT  CONTRAST TECHNIQUE: Contiguous axial images were obtained from the base of the skull through the vertex without intravenous contrast. COMPARISON:  Prior CT from 02/07/2019 FINDINGS: Brain: There has been slight interval decrease in size of the dominant left frontal intraparenchymal hemorrhage, measuring up to approximately 4 cm in maximal diameter. Surrounding low-density vasogenic edema has increased with worsened regional mass effect. Mass effect on the adjacent frontal horn of the left lateral ventricle with up to 6 mm of left-to-right shift, increased from  previous. No hydrocephalus or ventricular trapping. Additional small hemorrhagic contusions involving the anterior temporal poles relatively similar in size without significant regional mass effect. Scattered subarachnoid hemorrhage has decreased from previous. Extra-axial hemorrhage overlying the left frontal convexity relatively similar measuring up to 7 mm in maximal thickness. Subdural hemorrhage overlying the left parietal convexity relatively similar as well measuring up to 6 mm in maximal thickness, although a slightly less dense as compared to previous exam. There has been interval development of a small subdural hygroma overlying the right cerebral hemisphere measuring up to 5 mm. Remainder the brain is stable in appearance. No new intracranial hemorrhage. No acute large vessel territory infarct. Vascular: No hyperdense vessel. Calcified atherosclerosis noted at the skull base. Skull: Decreasing right parietal scalp contusion. Calvarium unchanged. Sinuses/Orbits: Globes and orbital soft tissues demonstrate no acute finding. Paranasal sinuses remain clear. Nasogastric tube noted. No mastoid effusion. Other: None. IMPRESSION: 1. Overall interval decrease in size of dominant 4 cm left frontal intraparenchymal hemorrhage, but with increased surrounding edema and regional mass effect. Associated left-to-right shift now measures up to 6 mm. No hydrocephalus or ventricular trapping. 2. Similar parenchymal contusions involving the anterior temporal poles bilaterally, with decreased subarachnoid hemorrhage as compared to previous. Subdural hemorrhage overlying the left cerebral convexity little interval changed. 3. Interval development of 5 mm right subdural hygroma. Electronically Signed   By: Rise Mu M.D.   On: 02/11/2019 17:15   Ct Head Wo Contrast  Result Date: 02/07/2019 CLINICAL DATA:  Intracranial hemorrhage with new vomiting and change in pupil EXAM: CT HEAD WITHOUT CONTRAST TECHNIQUE:  Contiguous axial images were obtained from the base of the skull through the vertex without intravenous contrast. COMPARISON:  Brain MRI from yesterday FINDINGS: Brain: Mixed mainly high-density hematoma in the left frontal lobe unchanged from MRI at up to 4 cm diameter. Increase in regional edema with local mass effect. Scattered subarachnoid hemorrhage. Subdural hematoma along the left cerebral convexity measuring up to 6 mm in thickness, not increased. Hemorrhagic contusion in the right temporal pole with mild local subarachnoid hemorrhage. No appreciable mass effect in this area. Midline shift measures up to 4 mm at the anterior septum pellucidum. Vascular: Negative Skull: Right parietal scalp hematoma. Sinuses/Orbits: Bilateral cataract resection. IMPRESSION: No increase in multifocal intracranial hemorrhage, but there is increased swelling around the 4 cm left frontal hematoma with midline shift measuring up to 4 mm. No herniation or entrapment. Electronically Signed   By: Marnee Spring M.D.   On: 02/07/2019 05:22   Ct Head Wo Contrast  Result Date: 02/06/2019 CLINICAL DATA:  Fall EXAM: CT HEAD WITHOUT CONTRAST CT CERVICAL SPINE WITHOUT CONTRAST TECHNIQUE: Multidetector CT imaging of the head and cervical spine was performed following the standard protocol without intravenous contrast. Multiplanar CT image reconstructions of the cervical spine were also generated. COMPARISON:  Head CT 01/04/2017 FINDINGS: CT HEAD FINDINGS Brain: There is a left frontal intraparenchymal hematoma measuring 2.2 x 2.2 x 3.9 cm (volume = 9.9 cm^3) with moderate surrounding  edema. There is mixed subdural and subarachnoid blood over the left convexity, extending into the sylvian fissure. Smaller amount of subarachnoid blood the right sylvian fissure. No midline shift or other significant mass effect. No hydrocephalus. Vascular: No abnormal hyperdensity of the major intracranial arteries or dural venous sinuses. No intracranial  atherosclerosis. Skull: Large right parietal posterior scalp hematoma. Sinuses/Orbits: No fluid levels or advanced mucosal thickening of the visualized paranasal sinuses. No mastoid or middle ear effusion. The orbits are normal. CT CERVICAL SPINE FINDINGS Alignment: No static subluxation. Facets are aligned. Occipital condyles are normally positioned. Skull base and vertebrae: There is an old type 2 fracture of the dens with chronic nonunion. There is there is C3-C6 anterior fusion with solid anterior fusion mass. No acute fracture. Soft tissues and spinal canal: No prevertebral fluid or swelling. No visible canal hematoma. Disc levels: No bony spinal canal stenosis. Upper chest: No pneumothorax, pulmonary nodule or pleural effusion. Other: Normal visualized paraspinal cervical soft tissues. IMPRESSION: 1. Left frontal intraparenchymal hematoma with volume approximately 10 mL and mild surrounding edema with small volume subarachnoid/subdural blood over both convexities, left-greater-than-right. 2. Posterior right parietal scalp hematoma, consistent with a contrecoup pattern of injury. 3. No acute fracture or static subluxation of the cervical spine. 4. 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 Critical Value/emergent results were called by telephone at the time of interpretation on 02/06/2019 at 8:41 pm to Dr. Mancel Bale , who verbally acknowledged these results. Electronically Signed   By: Deatra Robinson M.D.   On: 02/06/2019 20:47   Ct Chest W Contrast  Result Date: 02/06/2019 CLINICAL DATA:  Blunt abdominal trauma. EXAM: CT CHEST, ABDOMEN, AND PELVIS WITH CONTRAST TECHNIQUE: Multidetector CT imaging of the chest, abdomen and pelvis was performed following the standard protocol during bolus administration of intravenous contrast. CONTRAST:  OMNIPAQUE IOHEXOL 300 MG/ML  SOLN COMPARISON:  CT chest 08/06/2017. FINDINGS: CT CHEST FINDINGS Cardiovascular: Normal heart size. No pericardial  effusions. Coronary artery and mitral valve annulus calcifications. Normal caliber thoracic aorta with calcifications. No aortic dissection. Central pulmonary arteries are well opacified without evidence of significant pulmonary embolus. Mediastinum/Nodes: Esophagus is decompressed. No significant lymphadenopathy in the chest. No abnormal mediastinal gas or fluid collections. Lungs/Pleura: Atelectasis in the lung bases. No focal consolidation. No pleural effusions. No pneumothorax. Airways are patent. Musculoskeletal: Postoperative changes with lower cervical anterior fixation and right shoulder arthroplasty. Thoracolumbar scoliosis convex towards the right in the thoracic region and towards the left in the lumbar region. Diffuse degenerative changes throughout the thoracic spine. No vertebral compression deformities. Sternum and ribs appear intact. No acute displaced fractures identified. CT ABDOMEN PELVIS FINDINGS Hepatobiliary: No hepatic injury or perihepatic hematoma. Gallbladder is unremarkable Pancreas: Unremarkable. No pancreatic ductal dilatation or surrounding inflammatory changes. Spleen: No splenic injury or perisplenic hematoma. Adrenals/Urinary Tract: No adrenal hemorrhage or renal injury identified. Bladder is unremarkable. Stomach/Bowel: Stomach, small bowel, and colon are not abnormally distended. Stool fills the colon. No wall thickening or inflammatory changes appreciated. No mesenteric hematoma or gas collections. Appendix is not identified. Vascular/Lymphatic: Aortic atherosclerosis. No enlarged abdominal or pelvic lymph nodes. Reproductive: Uterus and ovaries are not enlarged. Calcifications in the uterus likely representing calcified fibroids. Other: No free air or free fluid in the abdomen. Abdominal wall musculature appears intact. Musculoskeletal: Thoracolumbar scoliosis. No vertebral compression deformities. Degenerative changes throughout the lumbar spine with narrowed interspaces and  endplate hypertrophic changes. Sacrum, pelvis, and hips appear intact. No acute fractures are identified. IMPRESSION: 1.  No acute posttraumatic changes demonstrated in the chest, abdomen, or pelvis. 2. Atelectasis in the lung bases. 3. No evidence of mediastinal injury or pulmonary parenchymal injury. 4. No evidence of solid organ injury or bowel perforation. 5. Thoracolumbar scoliosis and degenerative changes. 6. Calcified uterine fibroids. 7. Aortic atherosclerosis. Electronically Signed   By: Burman Nieves M.D.   On: 02/06/2019 20:53   Ct Cervical Spine Wo Contrast  Result Date: 02/06/2019 CLINICAL DATA:  Fall EXAM: CT HEAD WITHOUT CONTRAST CT CERVICAL SPINE WITHOUT CONTRAST TECHNIQUE: Multidetector CT imaging of the head and cervical spine was performed following the standard protocol without intravenous contrast. Multiplanar CT image reconstructions of the cervical spine were also generated. COMPARISON:  Head CT 01/04/2017 FINDINGS: CT HEAD FINDINGS Brain: There is a left frontal intraparenchymal hematoma measuring 2.2 x 2.2 x 3.9 cm (volume = 9.9 cm^3) with moderate surrounding edema. There is mixed subdural and subarachnoid blood over the left convexity, extending into the sylvian fissure. Smaller amount of subarachnoid blood the right sylvian fissure. No midline shift or other significant mass effect. No hydrocephalus. Vascular: No abnormal hyperdensity of the major intracranial arteries or dural venous sinuses. No intracranial atherosclerosis. Skull: Large right parietal posterior scalp hematoma. Sinuses/Orbits: No fluid levels or advanced mucosal thickening of the visualized paranasal sinuses. No mastoid or middle ear effusion. The orbits are normal. CT CERVICAL SPINE FINDINGS Alignment: No static subluxation. Facets are aligned. Occipital condyles are normally positioned. Skull base and vertebrae: There is an old type 2 fracture of the dens with chronic nonunion. There is there is C3-C6 anterior  fusion with solid anterior fusion mass. No acute fracture. Soft tissues and spinal canal: No prevertebral fluid or swelling. No visible canal hematoma. Disc levels: No bony spinal canal stenosis. Upper chest: No pneumothorax, pulmonary nodule or pleural effusion. Other: Normal visualized paraspinal cervical soft tissues. IMPRESSION: 1. Left frontal intraparenchymal hematoma with volume approximately 10 mL and mild surrounding edema with small volume subarachnoid/subdural blood over both convexities, left-greater-than-right. 2. Posterior right parietal scalp hematoma, consistent with a contrecoup pattern of injury. 3. No acute fracture or static subluxation of the cervical spine. 4. 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 Critical Value/emergent results were called by telephone at the time of interpretation on 02/06/2019 at 8:41 pm to Dr. Mancel Bale , who verbally acknowledged these results. Electronically Signed   By: Deatra Robinson M.D.   On: 02/06/2019 20:47   Mr Maxine Glenn Head Wo Contrast  Result Date: 02/07/2019 CLINICAL DATA:  Initial evaluation for acute syncope, fall, traumatic intracranial hemorrhage. EXAM: MRI HEAD WITHOUT AND WITH CONTRAST MRA HEAD WITHOUT CONTRAST TECHNIQUE: Multiplanar, multiecho pulse sequences of the brain and surrounding structures were obtained without and with intravenous contrast. Angiographic images of the head were obtained using MRA technique without contrast. CONTRAST:  5 cc of Gadavist. COMPARISON:  Prior CT from earlier the same day. FINDINGS: MRI HEAD FINDINGS Brain: Examination mildly degraded by motion artifact. Intraparenchymal hematoma centered at the anterior left frontal lobe again seen, measuring 3.9 x 4.1 x 5.1 cm (estimated volume 40 cc, previously 10 cc). Internal fluid fluid level. Localized edema and regional mass effect mildly increased, with partial effacement of the frontal horn of the adjacent left lateral ventricle. Mild 4 mm left-to-right  midline shift. Additional small hemorrhagic contusions at the temporal poles bilaterally, measuring 17 mm on the right (series 26, image 21) and 14 mm on the left (series 26, image 19). Localized edema without significant regional mass effect.  Moderate volume subarachnoid hemorrhage involving the left greater than right cerebral hemispheres with extension into the sylvian fissures. Small amount of subarachnoid seen within the interpeduncular cistern. Small volume intraventricular hemorrhage now seen, likely related to redistribution. No hydrocephalus or ventricular trapping. Left holo hemispheric subdural hematoma measures up to 6 mm in maximal thickness at the level of the left parietal convexity. Smaller right convexity subdural hemorrhage measures up to 3 mm in maximal thickness. Mild extension along the falx noted. Underlying age-related cerebral atrophy. Patchy T2/FLAIR hyperintensity within the bilateral thalami, likely related to chronic microvascular ischemic disease, mild for age. No evidence for acute or subacute infarct. Gray-white matter differentiation otherwise maintained. No areas of chronic infarction. No mass lesion. Minimal patchy enhancement underlying the left frontal hematoma felt to be reactive in nature. No underlying discrete mass identified. No other abnormal enhancement within the brain. Pituitary gland suprasellar region normal. Midline structures intact. Vascular: Major intracranial vascular flow voids maintained. Skull and upper cervical spine: Craniocervical junction within normal limits. Postsurgical changes noted within the upper cervical spine. Bone marrow signal intensity within normal limits. Left parietal scalp contusion with superimposed 13 mm soft tissue hematoma noted. Patient status post bilateral ocular lens replacement. Paranasal sinuses are largely clear. Right concha bullosa with associated right-to-left nasal septal deviation noted. No mastoid effusion. Inner ear  structures grossly normal. Sinuses/Orbits: None. Other: None. MRA HEAD FINDINGS ANTERIOR CIRCULATION: Examination mildly degraded by motion artifact. Distal cervical segments of the internal carotid arteries are widely patent with symmetric antegrade flow. Petrous, cavernous, and supraclinoid segments patent with scattered atheromatous irregularity. Probable short-segment moderate stenoses at the anterior cavernous right ICA and supraclinoid left ICA. Apparent 3 mm outpouching arising at the region of the right ophthalmic artery, which could reflect a small aneurysm or vascular infundibulum (series 7, image 93). Finding not entirely certain on this motion degraded exam. ICA termini well perfused. A1 segments widely patent bilaterally. Normal anterior communicating artery. Anterior cerebral arteries patent to their distal aspects without stenosis. M1 segments patent bilaterally. Normal MCA bifurcations. Distal MCA branches well perfused and symmetric. No vascular abnormality seen underlying the left frontal hematoma. POSTERIOR CIRCULATION: Vertebral arteries patent to the vertebrobasilar junction without flow-limiting stenosis. Left PICA patent. Right PICA not seen. Probable short fenestration at the proximal basilar artery noted. Basilar patent to its distal aspect without stenosis. Superior cerebral arteries patent bilaterally. PCA supplied via the basilar as well as small bilateral posterior communicating arteries. PCAs patent to their distal aspects without appreciable stenosis. IMPRESSION: MRI HEAD IMPRESSION: 1. Interval expansion of left frontal intraparenchymal hematoma, now measuring up to 40 cc in volume (previously 10 cc). Localized edema and regional mass effect mildly increased. No underlying mass lesion or other abnormality. 2. Additional small hemorrhagic contusions at the anterior temporal poles bilaterally as above. 3. Moderate volume traumatic subarachnoid hemorrhage involving the left greater than  right cerebral hemispheres with small left greater than right subdural hematomas. Associated mild 4 mm left-to-right midline shift. No hydrocephalus or ventricular trapping. 4. Evolving right parietal scalp contusion. 5. Otherwise negative brain MRI. No other findings to explain intracranial hemorrhage identified. MRA HEAD IMPRESSION: 1. Technically limited exam due to motion artifact. 2. No vascular abnormality underlying the left frontal intraparenchymal hematoma identified. 3. 3 mm focal outpouching arising from the cavernous right ICA, which could reflect a small aneurysm versus vascular infundibulum, difficult to be certain given motion artifact on this exam. Either way, finding felt to most likely be incidental in nature, and unrelated  to the acute intracranial hemorrhage. 4. Atherosclerotic change within the carotid siphons with associated moderate multifocal stenoses as above. Electronically Signed   By: Rise Mu M.D.   On: 02/07/2019 01:09   Mr Laqueta Jean UX Contrast  Result Date: 02/07/2019 CLINICAL DATA:  Initial evaluation for acute syncope, fall, traumatic intracranial hemorrhage. EXAM: MRI HEAD WITHOUT AND WITH CONTRAST MRA HEAD WITHOUT CONTRAST TECHNIQUE: Multiplanar, multiecho pulse sequences of the brain and surrounding structures were obtained without and with intravenous contrast. Angiographic images of the head were obtained using MRA technique without contrast. CONTRAST:  5 cc of Gadavist. COMPARISON:  Prior CT from earlier the same day. FINDINGS: MRI HEAD FINDINGS Brain: Examination mildly degraded by motion artifact. Intraparenchymal hematoma centered at the anterior left frontal lobe again seen, measuring 3.9 x 4.1 x 5.1 cm (estimated volume 40 cc, previously 10 cc). Internal fluid fluid level. Localized edema and regional mass effect mildly increased, with partial effacement of the frontal horn of the adjacent left lateral ventricle. Mild 4 mm left-to-right midline shift.  Additional small hemorrhagic contusions at the temporal poles bilaterally, measuring 17 mm on the right (series 26, image 21) and 14 mm on the left (series 26, image 19). Localized edema without significant regional mass effect. Moderate volume subarachnoid hemorrhage involving the left greater than right cerebral hemispheres with extension into the sylvian fissures. Small amount of subarachnoid seen within the interpeduncular cistern. Small volume intraventricular hemorrhage now seen, likely related to redistribution. No hydrocephalus or ventricular trapping. Left holo hemispheric subdural hematoma measures up to 6 mm in maximal thickness at the level of the left parietal convexity. Smaller right convexity subdural hemorrhage measures up to 3 mm in maximal thickness. Mild extension along the falx noted. Underlying age-related cerebral atrophy. Patchy T2/FLAIR hyperintensity within the bilateral thalami, likely related to chronic microvascular ischemic disease, mild for age. No evidence for acute or subacute infarct. Gray-white matter differentiation otherwise maintained. No areas of chronic infarction. No mass lesion. Minimal patchy enhancement underlying the left frontal hematoma felt to be reactive in nature. No underlying discrete mass identified. No other abnormal enhancement within the brain. Pituitary gland suprasellar region normal. Midline structures intact. Vascular: Major intracranial vascular flow voids maintained. Skull and upper cervical spine: Craniocervical junction within normal limits. Postsurgical changes noted within the upper cervical spine. Bone marrow signal intensity within normal limits. Left parietal scalp contusion with superimposed 13 mm soft tissue hematoma noted. Patient status post bilateral ocular lens replacement. Paranasal sinuses are largely clear. Right concha bullosa with associated right-to-left nasal septal deviation noted. No mastoid effusion. Inner ear structures grossly  normal. Sinuses/Orbits: None. Other: None. MRA HEAD FINDINGS ANTERIOR CIRCULATION: Examination mildly degraded by motion artifact. Distal cervical segments of the internal carotid arteries are widely patent with symmetric antegrade flow. Petrous, cavernous, and supraclinoid segments patent with scattered atheromatous irregularity. Probable short-segment moderate stenoses at the anterior cavernous right ICA and supraclinoid left ICA. Apparent 3 mm outpouching arising at the region of the right ophthalmic artery, which could reflect a small aneurysm or vascular infundibulum (series 7, image 93). Finding not entirely certain on this motion degraded exam. ICA termini well perfused. A1 segments widely patent bilaterally. Normal anterior communicating artery. Anterior cerebral arteries patent to their distal aspects without stenosis. M1 segments patent bilaterally. Normal MCA bifurcations. Distal MCA branches well perfused and symmetric. No vascular abnormality seen underlying the left frontal hematoma. POSTERIOR CIRCULATION: Vertebral arteries patent to the vertebrobasilar junction without flow-limiting stenosis. Left PICA patent. Right PICA  not seen. Probable short fenestration at the proximal basilar artery noted. Basilar patent to its distal aspect without stenosis. Superior cerebral arteries patent bilaterally. PCA supplied via the basilar as well as small bilateral posterior communicating arteries. PCAs patent to their distal aspects without appreciable stenosis. IMPRESSION: MRI HEAD IMPRESSION: 1. Interval expansion of left frontal intraparenchymal hematoma, now measuring up to 40 cc in volume (previously 10 cc). Localized edema and regional mass effect mildly increased. No underlying mass lesion or other abnormality. 2. Additional small hemorrhagic contusions at the anterior temporal poles bilaterally as above. 3. Moderate volume traumatic subarachnoid hemorrhage involving the left greater than right cerebral  hemispheres with small left greater than right subdural hematomas. Associated mild 4 mm left-to-right midline shift. No hydrocephalus or ventricular trapping. 4. Evolving right parietal scalp contusion. 5. Otherwise negative brain MRI. No other findings to explain intracranial hemorrhage identified. MRA HEAD IMPRESSION: 1. Technically limited exam due to motion artifact. 2. No vascular abnormality underlying the left frontal intraparenchymal hematoma identified. 3. 3 mm focal outpouching arising from the cavernous right ICA, which could reflect a small aneurysm versus vascular infundibulum, difficult to be certain given motion artifact on this exam. Either way, finding felt to most likely be incidental in nature, and unrelated to the acute intracranial hemorrhage. 4. Atherosclerotic change within the carotid siphons with associated moderate multifocal stenoses as above. Electronically Signed   By: Rise Mu M.D.   On: 02/07/2019 01:09   Ct Abdomen Pelvis W Contrast  Result Date: 02/06/2019 CLINICAL DATA:  Blunt abdominal trauma. EXAM: CT CHEST, ABDOMEN, AND PELVIS WITH CONTRAST TECHNIQUE: Multidetector CT imaging of the chest, abdomen and pelvis was performed following the standard protocol during bolus administration of intravenous contrast. CONTRAST:  OMNIPAQUE IOHEXOL 300 MG/ML  SOLN COMPARISON:  CT chest 08/06/2017. FINDINGS: CT CHEST FINDINGS Cardiovascular: Normal heart size. No pericardial effusions. Coronary artery and mitral valve annulus calcifications. Normal caliber thoracic aorta with calcifications. No aortic dissection. Central pulmonary arteries are well opacified without evidence of significant pulmonary embolus. Mediastinum/Nodes: Esophagus is decompressed. No significant lymphadenopathy in the chest. No abnormal mediastinal gas or fluid collections. Lungs/Pleura: Atelectasis in the lung bases. No focal consolidation. No pleural effusions. No pneumothorax. Airways are patent.  Musculoskeletal: Postoperative changes with lower cervical anterior fixation and right shoulder arthroplasty. Thoracolumbar scoliosis convex towards the right in the thoracic region and towards the left in the lumbar region. Diffuse degenerative changes throughout the thoracic spine. No vertebral compression deformities. Sternum and ribs appear intact. No acute displaced fractures identified. CT ABDOMEN PELVIS FINDINGS Hepatobiliary: No hepatic injury or perihepatic hematoma. Gallbladder is unremarkable Pancreas: Unremarkable. No pancreatic ductal dilatation or surrounding inflammatory changes. Spleen: No splenic injury or perisplenic hematoma. Adrenals/Urinary Tract: No adrenal hemorrhage or renal injury identified. Bladder is unremarkable. Stomach/Bowel: Stomach, small bowel, and colon are not abnormally distended. Stool fills the colon. No wall thickening or inflammatory changes appreciated. No mesenteric hematoma or gas collections. Appendix is not identified. Vascular/Lymphatic: Aortic atherosclerosis. No enlarged abdominal or pelvic lymph nodes. Reproductive: Uterus and ovaries are not enlarged. Calcifications in the uterus likely representing calcified fibroids. Other: No free air or free fluid in the abdomen. Abdominal wall musculature appears intact. Musculoskeletal: Thoracolumbar scoliosis. No vertebral compression deformities. Degenerative changes throughout the lumbar spine with narrowed interspaces and endplate hypertrophic changes. Sacrum, pelvis, and hips appear intact. No acute fractures are identified. IMPRESSION: 1. No acute posttraumatic changes demonstrated in the chest, abdomen, or pelvis. 2. Atelectasis in the lung bases. 3.  No evidence of mediastinal injury or pulmonary parenchymal injury. 4. No evidence of solid organ injury or bowel perforation. 5. Thoracolumbar scoliosis and degenerative changes. 6. Calcified uterine fibroids. 7. Aortic atherosclerosis. Electronically Signed   By: Burman Nieves M.D.   On: 02/06/2019 20:53   Dg Chest Port 1 View  Result Date: 02/08/2019 CLINICAL DATA:  Hypoxia.  History of diabetes. EXAM: PORTABLE CHEST 1 VIEW COMPARISON:  February 07, 2019 FINDINGS: Transcutaneous pacer leads overlie the left chest. No pneumothorax. The cardiomediastinal silhouette is normal. No pulmonary nodules or masses. No focal infiltrates. Probable atelectasis in the bases. No overt edema. IMPRESSION: No active disease. Electronically Signed   By: Gerome Sam III M.D   On: 02/08/2019 11:29   Dg Chest Port 1 View  Result Date: 02/07/2019 CLINICAL DATA:  Vomiting. Concern for aspiration. EXAM: PORTABLE CHEST 1 VIEW COMPARISON:  08/06/2017 FINDINGS: Normal heart size and pulmonary vascularity. Calcification in the mitral valve annulus. Lungs are clear. No blunting of costophrenic angles. No pneumothorax. Mediastinal contours appear intact. Calcification of the aorta. Postoperative changes in the cervical spine and right shoulder. Lumbar scoliosis convex towards the left. IMPRESSION: No evidence of active pulmonary disease. Aortic atherosclerosis. Electronically Signed   By: Burman Nieves M.D.   On: 02/07/2019 03:35   Dg Swallowing Func-speech Pathology  Result Date: 02/11/2019 Objective Swallowing Evaluation: Type of Study: MBS-Modified Barium Swallow Study  Patient Details Name: AMARE KONTOS MRN: [id number] Date of Birth: October 22, 1941 Today's Date: 02/11/2019 Time: SLP Start Time (ACUTE ONLY): 0913 -SLP Stop Time (ACUTE ONLY): 0930 SLP Time Calculation (min) (ACUTE ONLY): 17 min Past Medical History: Past Medical History: Diagnosis Date . Aortic stenosis, mild  . Arthritis  . Asthmatic bronchitis   with colds per patient . Carotid artery disease (HCC)   40-59% bilateral ICA stenosis . Cataract  . Cutaneous abscess of right foot  . Glaucoma  . Heart murmur   Dr Katrinka Blazing is her  cardiologist.  . Hyperlipemia  . Hypertension   Dr. Katrinka Blazing ~ 2 years ago . Neck fracture [facility])   july 2013  . Osteoporosis  . Syncope  . Type 1 diabetes mellitus (HCC)  Past Surgical History: Past Surgical History: Procedure Laterality Date . ANKLE FRACTURE SURGERY Left 2001  steel plate and 3 screws  . ANTERIOR CERVICAL CORPECTOMY N/A 07/31/2014  Procedure: Cervical Four to Cervical Six Corpectomy;  Surgeon: Karn Cassis, MD;  Location: [facility]UR[facility] OR[facility];  Service: Neurosurgery;  Laterality: N/A;  C4 to C6 Corpectomy . BREAST SURGERY  1988 . CATARACT EXTRACTION W/ INTRAOCULAR LENS  IMPLANT, BILATERAL Bilateral 2011  right and then left . EYE SURGERY   . FRACTURE SURGERY   . HAMMER TOE SURGERY  1998 . I&D EXTREMITY Right 09/27/2018  Procedure: IRRIGATION AND DEBRIDEMENT RIGHT FOOT;  Surgeon: Nadara Mustard, MD;  Location: [facility];  Service: Orthopedics;  Laterality: Right; . JOINT REPLACEMENT   . PACEMAKER IMPLANT N/A 02/10/2019  Procedure: PACEMAKER IMPLANT;  Surgeon: Marinus Maw, MD;  Location: [facility] INVASIVE CV LAB;  Service: Cardiovascular;  Laterality: N/A; . TONSILLECTOMY AND ADENOIDECTOMY  1948 . TOTAL SHOULDER REPLACEMENT  2010  right shoulder  . TUBAL LIGATION  1979 . TYMPANOSTOMY TUBE PLACEMENT Bilateral  HPI: CHARNIECE VENTURINO is a 78 y.o. female with history of diabetes mellitus type 1, asthmatic bronchitis, neck fx 2013, hyperlipidemia was brought to the ER after syncopal episode at the dentist office. Had some difficulty talking. CT head showed left frontal intraparenchymal  hematoma and subarachnoid and subdural hematoma in both convexities (small hemorrhagic contusions at the anterior temporal poles bilaterally). Pt had coffee-ground emesis, nause/vomiting which has resolved and GI has signed off. Reported history of syncope ascribed to swallowing and coughing.  Subjective: alert Assessment / Plan / Recommendation CHL IP CLINICAL IMPRESSIONS 02/11/2019 Clinical Impression Pt exhibits a mild-moderate oropharyngeal dysphagia marked by laryngeal penetration with thin and nectar. Orally, she demonstrated delays in  transit and bolus cohesion and fluidity of transit to pharynx. Pt unable to transit solid bolus into hypopharynx without thin liquid to assist accompanied by episodes of gagging. Barium entered nasal cavity for several seconds x 1 before exiting and swallow initiated. Epiglottic inversion was incomplete without retroflexion and mistiming of laryngeal closure allowing thin and nectar thick barium to reach just above the vocal cords. She was sensate to majority of penetrates, clearing throat reflexively to inconsistently clear penetrates. Chin tuck was ineffective to eliminate or decrease penetration. Decreased epiglottic inversion and contraction led to mild vallecular and pyriform sinus residue. Given effort required for regular texture solids to transit pharynx recommend initiation of full liquid diet, no straws, small sips, multiple swallows, throat clears throughout meals and crush pills. ST will continue.      SLP Visit Diagnosis Dysphagia, oropharyngeal phase (R13.12) Attention and concentration deficit following -- Frontal lobe and executive function deficit following -- Impact on safety and function Moderate aspiration risk   CHL IP TREATMENT RECOMMENDATION 02/11/2019 Treatment Recommendations Therapy as outlined in treatment plan below   Prognosis 02/11/2019 Prognosis for Safe Diet Advancement Good Barriers to Reach Goals -- Barriers/Prognosis Comment -- CHL IP DIET RECOMMENDATION 02/11/2019 SLP Diet Recommendations Thin liquid;Other (Comment) Liquid Administration via Cup;No straw Medication Administration Crushed with puree Compensations Small sips/bites;Slow rate;Multiple dry swallows after each bite/sip;Clear throat intermittently Postural Changes Seated upright at 90 degrees   CHL IP OTHER RECOMMENDATIONS 02/11/2019 Recommended Consults -- Oral Care Recommendations Oral care BID Other Recommendations --   CHL IP FOLLOW UP RECOMMENDATIONS 02/11/2019 Follow up Recommendations Home health SLP   CHL IP FREQUENCY  AND DURATION 02/11/2019 Speech Therapy Frequency (ACUTE ONLY) min 2x/week Treatment Duration 2 weeks      CHL IP ORAL PHASE 02/11/2019 Oral Phase Impaired Oral - Pudding Teaspoon -- Oral - Pudding Cup -- Oral - Honey Teaspoon -- Oral - Honey Cup -- Oral - Nectar Teaspoon -- Oral - Nectar Cup Decreased bolus cohesion Oral - Nectar Straw -- Oral - Thin Teaspoon -- Oral - Thin Cup Decreased bolus cohesion Oral - Thin Straw Decreased bolus cohesion Oral - Puree Reduced posterior propulsion Oral - Mech Soft -- Oral - Regular Reduced posterior propulsion Oral - Multi-Consistency -- Oral - Pill -- Oral Phase - Comment --  CHL IP PHARYNGEAL PHASE 02/11/2019 Pharyngeal Phase Impaired Pharyngeal- Pudding Teaspoon -- Pharyngeal -- Pharyngeal- Pudding Cup -- Pharyngeal -- Pharyngeal- Honey Teaspoon -- Pharyngeal -- Pharyngeal- Honey Cup -- Pharyngeal -- Pharyngeal- Nectar Teaspoon -- Pharyngeal -- Pharyngeal- Nectar Cup Penetration/Aspiration during swallow;Reduced epiglottic inversion;Pharyngeal residue - valleculae;Pharyngeal residue - pyriform Pharyngeal Material enters airway, remains ABOVE vocal cords and not ejected out Pharyngeal- Nectar Straw -- Pharyngeal -- Pharyngeal- Thin Teaspoon -- Pharyngeal -- Pharyngeal- Thin Cup Penetration/Aspiration during swallow;Pharyngeal residue - valleculae;Pharyngeal residue - pyriform;Reduced epiglottic inversion;Reduced airway/laryngeal closure Pharyngeal Material enters airway, remains ABOVE vocal cords and not ejected out Pharyngeal- Thin Straw Pharyngeal residue - pyriform;Pharyngeal residue - valleculae;Penetration/Aspiration during swallow;Reduced airway/laryngeal closure;Reduced epiglottic inversion Pharyngeal Material enters airway, remains ABOVE vocal cords and not ejected  out Pharyngeal- Puree WFL Pharyngeal -- Pharyngeal- Mechanical Soft -- Pharyngeal -- Pharyngeal- Regular (No Data) Pharyngeal -- Pharyngeal- Multi-consistency -- Pharyngeal -- Pharyngeal- Pill -- Pharyngeal --  Pharyngeal Comment --  CHL IP CERVICAL ESOPHAGEAL PHASE 02/11/2019 Cervical Esophageal Phase WFL Pudding Teaspoon -- Pudding Cup -- Honey Teaspoon -- Honey Cup -- Nectar Teaspoon -- Nectar Cup -- Nectar Straw -- Thin Teaspoon -- Thin Cup -- Thin Straw -- Puree -- Mechanical Soft -- Regular -- Multi-consistency -- Pill -- Cervical Esophageal Comment -- Royce Macadamia 02/11/2019, 11:25 AM  Breck Coons [employer] Pager [phone] Office [phone]             Vas US Carotid  Result Date: 02/10/2019 Carotid Arterial Duplex Study Indications:       Syncope. Limitations:       constant movement Comparison Study:  Prior study from 01/05/17 is available for comparison. Performing Technologist: Sherren Kerns RVS  Examination Guidelines: A complete evaluation includes B-mode imaging, spectral Doppler, color Doppler, and power Doppler as needed of all accessible portions of each vessel. Bilateral testing is considered an integral part of a complete examination. Limited examinations for reoccurring indications may be performed as noted.  Right Carotid Findings: +----------+--------+--------+--------+------------+------------------+           PSV cm/sEDV cm/sStenosisDescribe    Comments           +----------+--------+--------+--------+------------+------------------+ CCA Prox  107     14                          intimal thickening +----------+--------+--------+--------+------------+------------------+ CCA Distal95      16                          intimal thickening +----------+--------+--------+--------+------------+------------------+ ICA Prox  91      20              heterogenous                   +----------+--------+--------+--------+------------+------------------+ ICA Distal113     24                                             +----------+--------+--------+--------+------------+------------------+ ECA       173     9               calcific                        +----------+--------+--------+--------+------------+------------------+ +----------+--------+-------+--------+-------------------+           PSV cm/sEDV cmsDescribeArm Pressure (mmHG) +----------+--------+-------+--------+-------------------+ [id number]                                        +----------+--------+-------+--------+-------------------+ +---------+--------+--------+----------------------------------+ VertebralPSV cm/sEDV cm/sNot assessed and constant movement +---------+--------+--------+----------------------------------+  Left Carotid Findings: +----------+--------+--------+--------+-----------+------------------+           PSV cm/sEDV cm/sStenosisDescribe   Comments           +----------+--------+--------+--------+-----------+------------------+ CCA Prox  96      15                         intimal thickening +----------+--------+--------+--------+-----------+------------------+ CCA Distal84  17                         intimal thickening +----------+--------+--------+--------+-----------+------------------+ ICA Prox  78      19              homogeneous                   +----------+--------+--------+--------+-----------+------------------+ ICA Distal90      20                                            +----------+--------+--------+--------+-----------+------------------+ ECA       296     9               calcific                      +----------+--------+--------+--------+-----------+------------------+ +----------+--------+--------+--------+-------------------+ SubclavianPSV cm/sEDV cm/sDescribeArm Pressure (mmHG) +----------+--------+--------+--------+-------------------+           138                                         +----------+--------+--------+--------+-------------------+ +---------+--------+---+--------+--+ VertebralPSV cm/s135EDV cm/s22 +---------+--------+---+--------+--+   Summary: Right Carotid: Velocities in the right ICA are consistent with a 1-39% stenosis. Left Carotid: Velocities in the left ICA are consistent with a 1-39% stenosis. Vertebrals:  Left vertebral artery demonstrates antegrade flow. Subclavians: Normal flow hemodynamics were seen in bilateral subclavian              arteries. *See table(s) above for measurements and observations.  Electronically signed by Gretta Began MD on 02/10/2019 at 7:42:06 AM.    Final    (Echo, Carotid, EGD, Colonoscopy, ERCP)  Time coordinating discharge: Over 30 minutes  SIGNED:   Alessandra Bevels, MD  [facility] 02/14/2019, 1:59 PM Pager   If 7PM-7AM, please contact night-coverage [email or web address] Password [email or web address]

## 2019-02-14 NOTE — Progress Notes (Signed)
Marcello Fennel, MD  Physician  Physical Medicine and Rehabilitation  Consult Note  Signed  Date of Service:  02/12/2019 2:43 PM       Related encounter: ED to Hosp-Admission (Current) from 02/06/2019 in [place] [facility]      Signed      Expand All Collapse All    Show:Clear all [x] Manual[x] Template[] Copied  Added by: [x] Angiulli, Mcarthur Rossetti, PA-C[x] Marcello Fennel, MD  [] Hover for details      Physical Medicine and Rehabilitation Consult Reason for Consult:  Decreased functional mobility Referring Physician: [facility]   HPI: NELAH CORSINI is a 78 y.o.right handed female with history of diabetes mellitus, hyperlipidemia, hypertension,mild aortic stenosis, carotid artery disease, anterior cervical corpectomy, recurrent syncopal episodes followed by cardiology services.. Per chart review and daughter, patient lives alone. One level home 4 steps to entry. Performs all ADLs including driving uses a cane at times. Family in the area works. Presented 02/07/2019 after syncopal episode while at the dental office. Daughter reports she did strike her head. No seizure activity noted. Blood sugars greater than 200. Cranial CT/MRI showed left frontal intraparenchymal hematoma with volume approximate 10 mL. Posterior right parietal scalp hematoma consistent with contrecoup pattern of injury. No acute fracture. No underlying mass lesion. There was a 4 mm left to right midline shift. No hydrocephalus. Neurosurgery Dr. Conchita Paris advise conservative care. Noted blood pressure elevated maintained on nifedipine drip as well as heart rate in the 100s. Echocardiogram with ejection fraction of 65% normal systolic function. EP following for multiple episodes of intermittent high-grade heart block noted on telemetry. Patient later underwent permanent pacemaker placement 02/11/2019 per Dr. Gilman Schmidt. Currently on a full liquid diet wwith nasogastric tube in place. Therapy  evaluations completed with recommendations of physical medicine rehabilitation consult.   Review of Systems  Unable to perform ROS: Mental acuity       Past Medical History:  Diagnosis Date   Aortic stenosis, mild    Arthritis    Asthmatic bronchitis    with colds per patient   Carotid artery disease (HCC)    40-59% bilateral ICA stenosis   Cataract    Cutaneous abscess of right foot    Glaucoma    Heart murmur    Dr Katrinka Blazing is her  cardiologist.    Hyperlipemia    Hypertension    Dr. Katrinka Blazing ~ 2 years ago   Neck fracture [facility])    july 2013   Osteoporosis    Syncope    Type 1 diabetes mellitus [facility])         Past Surgical History:  Procedure Laterality Date   ANKLE FRACTURE SURGERY Left 2001   steel plate and 3 screws    ANTERIOR CERVICAL CORPECTOMY N/A 07/31/2014   Procedure: Cervical Four to Cervical Six Corpectomy;  Surgeon: Karn Cassis, MD;  Location: [facility];  Service: Neurosurgery;  Laterality: N/A;  C4 to C6 Corpectomy   BREAST SURGERY  1988   CATARACT EXTRACTION W/ INTRAOCULAR LENS  IMPLANT, BILATERAL Bilateral 2011   right and then left   EYE SURGERY     FRACTURE SURGERY     HAMMER TOE SURGERY  1998   I&D EXTREMITY Right 09/27/2018   Procedure: IRRIGATION AND DEBRIDEMENT RIGHT FOOT;  Surgeon: Nadara Mustard, MD;  Location: [facility] OR;  Service: Orthopedics;  Laterality: Right;   JOINT REPLACEMENT     PACEMAKER IMPLANT N/A 02/10/2019   Procedure: PACEMAKER IMPLANT;  Surgeon: Lewayne Bunting  W, MD;  Location: [facility];  Service: Cardiovascular;  Laterality: N/A;   TONSILLECTOMY AND ADENOIDECTOMY  1948   TOTAL SHOULDER REPLACEMENT  2010   right shoulder    TUBAL LIGATION  1979   TYMPANOSTOMY TUBE PLACEMENT Bilateral         Family History  Problem Relation Age of Onset   Cancer Mother    Heart Problems Father    Social History:  reports that she has never smoked. She has  never used smokeless tobacco. She reports current alcohol use of about 7.0 standard drinks of alcohol per week. No history on file for drug. Allergies:       Allergies  Allergen Reactions   Cefaclor Anaphylaxis   Tetracycline Anaphylaxis   Vancomycin Anaphylaxis   Augmentin [Amoxicillin-Pot Clavulanate] Other (See Comments)    Reaction unknown >> Anaphylaxis Has patient had a PCN reaction causing immediate rash, facial/tongue/throat swelling, SOB or lightheadedness with hypotension: Unknown Has patient had a PCN reaction causing severe rash involving mucus membranes or skin necrosis: Unknown Has patient had a PCN reaction that required hospitalization: Unknown Has patient had a PCN reaction occurring within the last 10 years: Unknown If all of the above answers are "NO", then may proceed with Cephalosporin use.    Prednisone Other (See Comments)    Reaction unknown         Medications Prior to Admission  Medication Sig Dispense Refill   acetaminophen (TYLENOL) 500 MG tablet Take 500 mg by mouth 4 (four) times daily.      aspirin EC 81 MG tablet Take 81 mg by mouth daily.     Chromium Picolinate 1000 MCG TABS Take 1,000 mcg by mouth daily at 12 noon.      CINNAMON PO Take 1,000 mg by mouth 4 (four) times daily.     HUMALOG KWIKPEN 100 UNIT/ML KiwkPen Inject 2-13 Units into the skin 4 (four) times daily. Sliding Scale Insulin  3   Multiple Vitamin (MULTIVITAMIN WITH MINERALS) TABS tablet Take 1 tablet by mouth daily.     Probiotic Product (PROBIOTIC PO) Take 1 tablet by mouth daily.     simvastatin (ZOCOR) 40 MG tablet Take 40 mg by mouth daily at 8 pm.      traMADol (ULTRAM) 50 MG tablet Take 1 tablet (50 mg total) by mouth every 6 (six) hours as needed for moderate pain. 30 tablet 0   TRESIBA 100 UNIT/ML SOLN Inject 10 Units into the skin daily.   6   Turmeric 500 MG CAPS Take 500 mg by mouth daily at 12 noon.      albuterol (PROVENTIL  HFA;VENTOLIN HFA) 108 (90 Base) MCG/ACT inhaler Inhale 2 puffs into the lungs every 6 (six) hours as needed for wheezing or shortness of breath. (Patient not taking: Reported on 02/06/2019) 1 Inhaler 2   clindamycin (CLEOCIN) 300 MG capsule Take 1 capsule (300 mg total) by mouth 3 (three) times daily. (Patient not taking: Reported on 02/06/2019) 42 capsule 0   mupirocin ointment (BACTROBAN) 2 % Apply 1 application topically daily. Apply to right foot incision daily (Patient not taking: Reported on 02/06/2019) 22 g 0   ondansetron (ZOFRAN ODT) 4 MG disintegrating tablet Take 1 tablet (4 mg total) by mouth every 8 (eight) hours as needed for nausea or vomiting. (Patient not taking: Reported on 02/06/2019) 20 tablet 0   ondansetron (ZOFRAN) 4 MG tablet Take 1 tablet (4 mg total) by mouth every 8 (eight) hours as needed for  nausea or vomiting. (Patient not taking: Reported on 02/06/2019) 20 tablet 0   sulfamethoxazole-trimethoprim (BACTRIM DS,SEPTRA DS) 800-160 MG tablet Take 1 tablet by mouth 2 (two) times daily. (Patient not taking: Reported on 02/06/2019) 6 tablet 0   sulfamethoxazole-trimethoprim (BACTRIM DS,SEPTRA DS) 800-160 MG tablet Take 1 tablet by mouth 2 (two) times daily. (Patient not taking: Reported on 02/06/2019) 40 tablet 0   triamcinolone (KENALOG) 0.025 % ointment Apply 1 application topically 2 (two) times daily. (Patient not taking: Reported on 02/06/2019) 30 g 0    Home: Home Living Family/patient expects to be discharged to:: Private residence Living Arrangements: Alone Available Help at Discharge: Family, Available PRN/intermittently Type of Home: House Home Access: Stairs to enter [employer] of Steps: 2 Entrance Stairs-Rails: Right(daughter reports can easily get handrails and/or ramp built) Home Layout: One level Bathroom Shower/Tub: Engineer, manufacturing systems: Handicapped height Home Equipment: Environmental consultant - 4 wheels, Bedside commode, Cane - quad, Shower  seat Additional Comments: daughter is flight attendant with flexible schedule, is working to try and take leave of absence for next month, son and daughter in Hospital doctor in [place] and able to help as well  Lives With: Alone  Functional History: Prior Function Level of Independence: Independent Comments: Performs all ADLs and IADLs including driving. Uses a cane as she wants Functional Status:  Mobility: Bed Mobility Overal bed mobility: Needs Assistance Bed Mobility: Rolling, Sidelying to Sit Rolling: Min assist Sidelying to sit: Min assist, HOB elevated Supine to sit: Min guard, HOB elevated General bed mobility comments: Step by step cues to get to EOB, assist with trunk. Use of rail. Cues to refrain from using LUE. Able to bring LEs in bed to return to supine. Transfers Overall transfer level: Needs assistance Equipment used: Rolling walker (2 wheeled) Transfers: Sit to/from Stand Sit to Stand: Min assist General transfer comment: Assist to power to standing but quickly returned to sitting position due to dizziness, eyes rolling around and feeling like she was going to ass out. VSS.  Ambulation/Gait Ambulation/Gait assistance: Min assist, +2 safety/equipment Gait Distance (Feet): 150 Feet Assistive device: Rolling walker (2 wheeled) Gait Pattern/deviations: Step-through pattern, Decreased stride length, Trunk flexed General Gait Details: 2nd person for line management, pt on 5LO2 via , SPO2 >93%, mild SOB, pt with trunk flexion and mild SOB, 3 standing rest breaks, deferred ambulating further due to fatigue. Once patient with glasses pt able to navigate hallways with simple cues and minimal running into objects Gait velocity: decreased  ADL: ADL Overall ADL's : Needs assistance/impaired Eating/Feeding: NPO Grooming: Wash/dry face, Maximal assistance, Sitting Grooming Details (indicate cue type and reason): Max A due to poor following of cues Upper Body Bathing: Moderate  assistance, Sitting Lower Body Bathing: Maximal assistance, Sit to/from stand Upper Body Dressing : Moderate assistance, Sitting Lower Body Dressing: Maximal assistance, Sit to/from stand Lower Body Dressing Details (indicate cue type and reason): Max A for donning socks due to poor cognition and balance Toilet Transfer: Minimal assistance, +2 for physical assistance, +2 for safety/equipment, Ambulation, RW(Simulated to recliner) Functional mobility during ADLs: Supervision/safety General ADL Comments: Pt presenting with poor cognition, balance, strength, and functional performance  Cognition: Cognition Overall Cognitive Status: Impaired/Different from baseline Arousal/Alertness: Lethargic Orientation Level: Oriented to person, Oriented to place, Oriented to situation(reported sometimes oriented to time) Attention: Sustained Sustained Attention: Impaired Sustained Attention Impairment: Verbal basic, Functional basic Memory: (TBA) Awareness: Impaired Awareness Impairment: Emergent impairment, Anticipatory impairment Problem Solving: Impaired Problem Solving Impairment: Functional basic Behaviors: Restless,  Impulsive Safety/Judgment: Impaired Cognition Arousal/Alertness: Lethargic Behavior During Therapy: Flat affect Overall Cognitive Status: Impaired/Different from baseline Area of Impairment: Orientation, Problem solving, Attention, Memory, Following commands, Safety/judgement, Awareness Orientation Level: Disoriented to, Situation Current Attention Level: Focused Memory: Decreased recall of precautions, Decreased short-term memory Following Commands: Follows one step commands inconsistently, Follows one step commands with increased time Safety/Judgement: Decreased awareness of deficits, Decreased awareness of safety(possible L sided neglect) Awareness: Intellectual Problem Solving: Slow processing, Requires verbal cues General Comments: Oriented to place with increased time and  date. Increased time and frequent cues to complete task.   Blood pressure (!) 166/70, pulse 98, temperature 98.3 F (36.8 C), temperature source Oral, resp. rate 19, height 5' (1.524 m), weight 49.5 kg, SpO2 100 %. Physical Exam  Constitutional: She appears well-developed and well-nourished.  HENT:  Right Ear: External ear normal.  Left Ear: External ear normal.  + NG  Eyes: EOM are normal. Right eye exhibits no discharge. Left eye exhibits no discharge.  Neck: Normal range of motion. Neck supple.  Cardiovascular: Normal rate and regular rhythm.  Respiratory: Effort normal and breath sounds normal.  GI: Soft. Bowel sounds are normal.  Musculoskeletal:     Comments: No edema or tenderness in extremities  Neurological: She is alert.  Oriented x2 Follows commands with some cueing.  Motor: 4/5 grossly throughout  Skin: Skin is warm and dry.  Psychiatric: Her affect is blunt. Her speech is delayed. She is slowed. Cognition and memory are impaired.    LabResultsLast24Hours       Results for orders placed or performed during the hospital encounter of 02/06/19 (from the past 24 hour(s))  Glucose, capillary     Status: Abnormal   Collection Time: 02/11/19  3:40 PM  Result Value Ref Range   Glucose-Capillary 289 (H) 70 - 99 mg/dL  Glucose, capillary     Status: Abnormal   Collection Time: 02/11/19  5:53 PM  Result Value Ref Range   Glucose-Capillary 333 (H) 70 - 99 mg/dL  Glucose, capillary     Status: Abnormal   Collection Time: 02/11/19  8:16 PM  Result Value Ref Range   Glucose-Capillary 420 (H) 70 - 99 mg/dL  Glucose, capillary     Status: Abnormal   Collection Time: 02/11/19 11:24 PM  Result Value Ref Range   Glucose-Capillary 228 (H) 70 - 99 mg/dL  Vitamin Z61     Status: Abnormal   Collection Time: 02/12/19  3:58 AM  Result Value Ref Range   Vitamin B-12 2,855 (H) 180 - 914 pg/mL  Folate     Status: None   Collection Time: 02/12/19  3:58 AM  Result  Value Ref Range   Folate 27.4 >5.9 ng/mL  Iron and TIBC     Status: Abnormal   Collection Time: 02/12/19  3:58 AM  Result Value Ref Range   Iron 46 28 - 170 ug/dL   TIBC 096 (L) 045 - 409 ug/dL   Saturation Ratios 19 10.4 - 31.8 %   UIBC 198 ug/dL  Ferritin     Status: None   Collection Time: 02/12/19  3:58 AM  Result Value Ref Range   Ferritin 87 11 - 307 ng/mL  Reticulocytes     Status: None   Collection Time: 02/12/19  3:58 AM  Result Value Ref Range   Retic Ct Pct 1.2 0.4 - 3.1 %   RBC. 4.04 3.87 - 5.11 MIL/uL   Retic Count, Absolute 50.1 19.0 - 186.0 K/uL  Immature Retic Fract 7.8 2.3 - 15.9 %  Comprehensive metabolic panel     Status: Abnormal   Collection Time: 02/12/19  3:58 AM  Result Value Ref Range   Sodium 158 (H) 135 - 145 mmol/L   Potassium 3.5 3.5 - 5.1 mmol/L   Chloride 113 (H) 98 - 111 mmol/L   CO2 37 (H) 22 - 32 mmol/L   Glucose, Bld 75 70 - 99 mg/dL   BUN 31 (H) 8 - 23 mg/dL   Creatinine, Ser 1.16 0.44 - 1.00 mg/dL   Calcium 8.9 8.9 - 57.9 mg/dL   Total Protein 5.9 (L) 6.5 - 8.1 g/dL   Albumin 2.8 (L) 3.5 - 5.0 g/dL   AST 15 15 - 41 U/L   ALT 17 0 - 44 U/L   Alkaline Phosphatase 58 38 - 126 U/L   Total Bilirubin 0.6 0.3 - 1.2 mg/dL   GFR calc non Af Amer >60 >60 mL/min   GFR calc Af Amer >60 >60 mL/min   Anion gap 8 5 - 15  CBC     Status: Abnormal   Collection Time: 02/12/19  3:58 AM  Result Value Ref Range   WBC 7.8 4.0 - 10.5 K/uL   RBC 4.04 3.87 - 5.11 MIL/uL   Hemoglobin 11.8 (L) 12.0 - 15.0 g/dL   HCT 03.8 33.3 - 83.2 %   MCV 93.1 80.0 - 100.0 fL   MCH 29.2 26.0 - 34.0 pg   MCHC 31.4 30.0 - 36.0 g/dL   RDW 91.9 16.6 - 06.0 %   Platelets 242 150 - 400 K/uL   nRBC 0.0 0.0 - 0.2 %  Glucose, capillary     Status: None   Collection Time: 02/12/19  4:23 AM  Result Value Ref Range   Glucose-Capillary 79 70 - 99 mg/dL  Glucose, capillary     Status: Abnormal   Collection Time: 02/12/19  8:14 AM   Result Value Ref Range   Glucose-Capillary 197 (H) 70 - 99 mg/dL  Glucose, capillary     Status: Abnormal   Collection Time: 02/12/19 11:40 AM  Result Value Ref Range   Glucose-Capillary 392 (H) 70 - 99 mg/dL      [id number])  Dg Chest 2 View  Result Date: 02/11/2019 CLINICAL DATA:  Pacemaker placement EXAM: CHEST - 2 VIEW COMPARISON:  Three days ago FINDINGS: Interval dual-chamber pacer from the left with leads over the right atrial appendage and right ventricle. Increased hazy density of the inferior chest attributed to small to moderate pleural effusions. No Kerley lines. No pneumothorax. Normal heart size. A feeding tube at least reaches the stomach. IMPRESSION: 1. New dual-chamber pacer without complicating feature. 2. New small to moderate pleural effusions. Electronically Signed   By: Marnee Spring M.D.   On: 02/11/2019 06:23   Ct Head Wo Contrast  Result Date: 02/11/2019 CLINICAL DATA:  Follow-up examination for intracranial hemorrhage. EXAM: CT HEAD WITHOUT CONTRAST TECHNIQUE: Contiguous axial images were obtained from the base of the skull through the vertex without intravenous contrast. COMPARISON:  Prior CT from 02/07/2019 FINDINGS: Brain: There has been slight interval decrease in size of the dominant left frontal intraparenchymal hemorrhage, measuring up to approximately 4 cm in maximal diameter. Surrounding low-density vasogenic edema has increased with worsened regional mass effect. Mass effect on the adjacent frontal horn of the left lateral ventricle with up to 6 mm of left-to-right shift, increased from previous. No hydrocephalus or ventricular trapping. Additional small hemorrhagic contusions involving the anterior  temporal poles relatively similar in size without significant regional mass effect. Scattered subarachnoid hemorrhage has decreased from previous. Extra-axial hemorrhage overlying the left frontal convexity relatively similar measuring up  to 7 mm in maximal thickness. Subdural hemorrhage overlying the left parietal convexity relatively similar as well measuring up to 6 mm in maximal thickness, although a slightly less dense as compared to previous exam. There has been interval development of a small subdural hygroma overlying the right cerebral hemisphere measuring up to 5 mm. Remainder the brain is stable in appearance. No new intracranial hemorrhage. No acute large vessel territory infarct. Vascular: No hyperdense vessel. Calcified atherosclerosis noted at the skull base. Skull: Decreasing right parietal scalp contusion. Calvarium unchanged. Sinuses/Orbits: Globes and orbital soft tissues demonstrate no acute finding. Paranasal sinuses remain clear. Nasogastric tube noted. No mastoid effusion. Other: None. IMPRESSION: 1. Overall interval decrease in size of dominant 4 cm left frontal intraparenchymal hemorrhage, but with increased surrounding edema and regional mass effect. Associated left-to-right shift now measures up to 6 mm. No hydrocephalus or ventricular trapping. 2. Similar parenchymal contusions involving the anterior temporal poles bilaterally, with decreased subarachnoid hemorrhage as compared to previous. Subdural hemorrhage overlying the left cerebral convexity little interval changed. 3. Interval development of 5 mm right subdural hygroma. Electronically Signed   By: Rise Mu M.D.   On: 02/11/2019 17:15   Dg Swallowing Func-speech Pathology  Result Date: 02/11/2019 Objective Swallowing Evaluation: Type of Study: MBS-Modified Barium Swallow Study  Patient Details Name: JAMILE REKOWSKI MRN: [id number] Date of Birth: 1941/01/10 Today's Date: 02/11/2019 Time: SLP Start Time (ACUTE ONLY): 0913 -SLP Stop Time (ACUTE ONLY): 0930 SLP Time Calculation (min) (ACUTE ONLY): 17 min Past Medical History: Past Medical History: Diagnosis Date  Aortic stenosis, mild   Arthritis   Asthmatic bronchitis   with colds per patient   Carotid artery disease (HCC)   40-59% bilateral ICA stenosis  Cataract   Cutaneous abscess of right foot   Glaucoma   Heart murmur   Dr Katrinka Blazing is her  cardiologist.   Hyperlipemia   Hypertension   Dr. Katrinka Blazing ~ 2 years ago  Neck fracture [facility])   july 2013  Osteoporosis   Syncope   Type 1 diabetes mellitus (HCC)  Past Surgical History: Past Surgical History: Procedure Laterality Date  ANKLE FRACTURE SURGERY Left 2001  steel plate and 3 screws   ANTERIOR CERVICAL CORPECTOMY N/A 07/31/2014  Procedure: Cervical Four to Cervical Six Corpectomy;  Surgeon: Karn Cassis, MD;  Location: [facility];  Service: Neurosurgery;  Laterality: N/A;  C4 to C6 Corpectomy  BREAST SURGERY  1988  CATARACT EXTRACTION W/ INTRAOCULAR LENS  IMPLANT, BILATERAL Bilateral 2011  right and then left  EYE SURGERY    FRACTURE SURGERY    HAMMER TOE SURGERY  1998  I&D EXTREMITY Right 09/27/2018  Procedure: IRRIGATION AND DEBRIDEMENT RIGHT FOOT;  Surgeon: Nadara Mustard, MD;  Location: [facility] OR;  Service: Orthopedics;  Laterality: Right;  JOINT REPLACEMENT    PACEMAKER IMPLANT N/A 02/10/2019  Procedure: PACEMAKER IMPLANT;  Surgeon: Marinus Maw, MD;  Location: [facility];  Service: Cardiovascular;  Laterality: N/A;  TONSILLECTOMY AND ADENOIDECTOMY  1948  TOTAL SHOULDER REPLACEMENT  2010  right shoulder   TUBAL LIGATION  1979  TYMPANOSTOMY TUBE PLACEMENT Bilateral  HPI: VENNESA BASTEDO is a 78 y.o. female with history of diabetes mellitus type 1, asthmatic bronchitis, neck fx 2013, hyperlipidemia was brought to the ER after syncopal episode at the dentist  office. Had some difficulty talking. CT head showed left frontal intraparenchymal hematoma and subarachnoid and subdural hematoma in both convexities (small hemorrhagic contusions at the anterior temporal poles bilaterally). Pt had coffee-ground emesis, nause/vomiting which has resolved and GI has signed off. Reported history of syncope ascribed to swallowing and  coughing.  Subjective: alert Assessment / Plan / Recommendation CHL IP CLINICAL IMPRESSIONS 02/11/2019 Clinical Impression Pt exhibits a mild-moderate oropharyngeal dysphagia marked by laryngeal penetration with thin and nectar. Orally, she demonstrated delays in transit and bolus cohesion and fluidity of transit to pharynx. Pt unable to transit solid bolus into hypopharynx without thin liquid to assist accompanied by episodes of gagging. Barium entered nasal cavity for several seconds x 1 before exiting and swallow initiated. Epiglottic inversion was incomplete without retroflexion and mistiming of laryngeal closure allowing thin and nectar thick barium to reach just above the vocal cords. She was sensate to majority of penetrates, clearing throat reflexively to inconsistently clear penetrates. Chin tuck was ineffective to eliminate or decrease penetration. Decreased epiglottic inversion and contraction led to mild vallecular and pyriform sinus residue. Given effort required for regular texture solids to transit pharynx recommend initiation of full liquid diet, no straws, small sips, multiple swallows, throat clears throughout meals and crush pills. ST will continue.      SLP Visit Diagnosis Dysphagia, oropharyngeal phase (R13.12) Attention and concentration deficit following -- Frontal lobe and executive function deficit following -- Impact on safety and function Moderate aspiration risk   CHL IP TREATMENT RECOMMENDATION 02/11/2019 Treatment Recommendations Therapy as outlined in treatment plan below   Prognosis 02/11/2019 Prognosis for Safe Diet Advancement Good Barriers to Reach Goals -- Barriers/Prognosis Comment -- CHL IP DIET RECOMMENDATION 02/11/2019 SLP Diet Recommendations Thin liquid;Other (Comment) Liquid Administration via Cup;No straw Medication Administration Crushed with puree Compensations Small sips/bites;Slow rate;Multiple dry swallows after each bite/sip;Clear throat intermittently Postural Changes  Seated upright at 90 degrees   CHL IP OTHER RECOMMENDATIONS 02/11/2019 Recommended Consults -- Oral Care Recommendations Oral care BID Other Recommendations --   CHL IP FOLLOW UP RECOMMENDATIONS 02/11/2019 Follow up Recommendations Home health SLP   CHL IP FREQUENCY AND DURATION 02/11/2019 Speech Therapy Frequency (ACUTE ONLY) min 2x/week Treatment Duration 2 weeks      CHL IP ORAL PHASE 02/11/2019 Oral Phase Impaired Oral - Pudding Teaspoon -- Oral - Pudding Cup -- Oral - Honey Teaspoon -- Oral - Honey Cup -- Oral - Nectar Teaspoon -- Oral - Nectar Cup Decreased bolus cohesion Oral - Nectar Straw -- Oral - Thin Teaspoon -- Oral - Thin Cup Decreased bolus cohesion Oral - Thin Straw Decreased bolus cohesion Oral - Puree Reduced posterior propulsion Oral - Mech Soft -- Oral - Regular Reduced posterior propulsion Oral - Multi-Consistency -- Oral - Pill -- Oral Phase - Comment --  CHL IP PHARYNGEAL PHASE 02/11/2019 Pharyngeal Phase Impaired Pharyngeal- Pudding Teaspoon -- Pharyngeal -- Pharyngeal- Pudding Cup -- Pharyngeal -- Pharyngeal- Honey Teaspoon -- Pharyngeal -- Pharyngeal- Honey Cup -- Pharyngeal -- Pharyngeal- Nectar Teaspoon -- Pharyngeal -- Pharyngeal- Nectar Cup Penetration/Aspiration during swallow;Reduced epiglottic inversion;Pharyngeal residue - valleculae;Pharyngeal residue - pyriform Pharyngeal Material enters airway, remains ABOVE vocal cords and not ejected out Pharyngeal- Nectar Straw -- Pharyngeal -- Pharyngeal- Thin Teaspoon -- Pharyngeal -- Pharyngeal- Thin Cup Penetration/Aspiration during swallow;Pharyngeal residue - valleculae;Pharyngeal residue - pyriform;Reduced epiglottic inversion;Reduced airway/laryngeal closure Pharyngeal Material enters airway, remains ABOVE vocal cords and not ejected out Pharyngeal- Thin Straw Pharyngeal residue - pyriform;Pharyngeal residue - valleculae;Penetration/Aspiration during swallow;Reduced airway/laryngeal closure;Reduced epiglottic inversion  Pharyngeal Material  enters airway, remains ABOVE vocal cords and not ejected out Pharyngeal- Puree WFL Pharyngeal -- Pharyngeal- Mechanical Soft -- Pharyngeal -- Pharyngeal- Regular (No Data) Pharyngeal -- Pharyngeal- Multi-consistency -- Pharyngeal -- Pharyngeal- Pill -- Pharyngeal -- Pharyngeal Comment --  CHL IP CERVICAL ESOPHAGEAL PHASE 02/11/2019 Cervical Esophageal Phase WFL Pudding Teaspoon -- Pudding Cup -- Honey Teaspoon -- Honey Cup -- Nectar Teaspoon -- Nectar Cup -- Nectar Straw -- Thin Teaspoon -- Thin Cup -- Thin Straw -- Puree -- Mechanical Soft -- Regular -- Multi-consistency -- Pill -- Cervical Esophageal Comment -- Royce Macadamia 02/11/2019, 11:25 AM  Breck Coons Lonell [employer]Ed Sports administrator Pager [phone] Office [phone]               Assessment/Plan: Diagnosis: left frontal intraparenchymal hematoma Labs and images (see above) independently reviewed.  Records reviewed and summated above.  1. Does the need for close, 24 hr/day medical supervision in concert with the patient's rehab needs make it unreasonable for this patient to be served in a less intensive setting? Yes  2. Co-Morbidities requiring supervision/potential complications: DM-extremely labile at present (Monitor in accordance with exercise and adjust meds as necessary), hyperlipidemia, HTN (monitor and provide prns in accordance with increased physical exertion and pain), mild aortic stenosis, carotid artery disease, anterior cervical corpectomy, recurrent syncopal episodes, hypernatremia (repeat labs, treat if necessary), anemia of chronic disease (repeat labs, consider transfusion if necessary to ensure appropriate perfusion for increased activity tolerance) 3. Due to bladder management, bowel management, safety, skin/wound care, disease management, medication administration and patient education, does the patient require 24 hr/day rehab nursing? Yes 4. Does the patient require coordinated care of a physician, rehab  nurse, PT (1-2 hrs/day, 5 days/week), OT (1-2 hrs/day, 5 days/week) and SLP (1-2 hrs/day, 5 days/week) to address physical and functional deficits in the context of the above medical diagnosis(es)? Yes Addressing deficits in the following areas: balance, endurance, locomotion, strength, transferring, bathing, dressing, toileting, cognition, language, swallowing and psychosocial support 5. Can the patient actively participate in an intensive therapy program of at least 3 hrs of therapy per day at least 5 days per week? Yes 6. The potential for patient to make measurable gains while on inpatient rehab is excellent 7. Anticipated functional outcomes upon discharge from inpatient rehab are supervision  with PT, supervision with OT, supervision with SLP. 8. Estimated rehab length of stay to reach the above functional goals is: 8-13 days. 9. Anticipated D/C setting: Home 10. Anticipated post D/C treatments: HH therapy and Home excercise program 11. Overall Rehab/Functional Prognosis: excellent and good  RECOMMENDATIONS: This patient's condition is appropriate for continued rehabilitative care in the following setting: CIR medically stable. Patient has agreed to participate in recommended program. Potentially Note that insurance prior authorization may be required for reimbursement for recommended care.  Comment: Rehab Admissions Coordinator to follow up.   I have personally performed a face to face diagnostic evaluation, including, but not limited to relevant history and physical exam findings, of this patient and developed relevant assessment and plan.  Additionally, I have reviewed and concur with the physician assistant's documentation above.   Maryla Morrow, MD, ABPMR Mcarthur Rossetti Angiulli, PA-C 02/12/2019        Revision History                   Routing History

## 2019-02-14 NOTE — Progress Notes (Addendum)
Inpatient Rehab Admissions Coordinator:    I met with pt and daughter at bedside.  They are still interested in potential rehab stay and I have insurance approval.  I will contact MD for approval for possible admission today.   Shann Medal, PT, DPT Admissions Coordinator [phone] 02/14/19  9:54 AM   Addendum: I have MD approval for admission today.  I will contact RNCM and CSW and let them know.

## 2019-02-14 NOTE — Progress Notes (Signed)
Michel Santee, PT  Rehab Admission Coordinator  Physical Medicine and Rehabilitation  PMR Pre-admission  Signed  Date of Service:  02/14/2019 10:39 AM       Related encounter: ED to Hosp-Admission (Current) from 02/06/2019 in [place]         Show:Clear all '[x]' Manual'[x]' Template'[x]' Copied  Added by: '[x]' Michel Santee, PT  '[]' Hover for details PMR Admission Coordinator Pre-Admission Assessment  Patient: Danielle Clarke is an 78 y.o., female MRN: [id number] DOB: [birth year] Height: 5' (152.4 cm) Weight: 52.2 kg                                                                                                                                                  Insurance Information HMO:      PPO:  Yes     PCP:      IPA:      80/20:      OTHER:  PRIMARY: [employer] Medicare      Policy#: [id number]      Subscriber: patient CM Name: Dorthula Nettles      Phone#: [phone]     Fax#: [phone] Pre-Cert#: [id number] with updates due to Dorthula Nettles on 02/20/2019     Employer:  Benefits:  Phone #: [phone]     Name:  Eff. Date: 11/27/2018     Deduct: $0      Out of Pocket Max: $4000 (met $210.71)      Life Max: n/a CIR: $160/day for days 1-10, $0/day for remaining days with insurance approval  SNF: $0/ day for days 1-20, $50/day for days 21-100 Outpatient:      Co-Pay: $20/visit Home Health: 100%      Co-Pay: n/a DME: 80%     Co-Pay: 20% Providers: PPO  SECONDARY: n/a  Medicaid Application Date:       Case Manager:  Disability Application Date:       Case Worker:   Emergency Contact Information         Contact Information    Name Relation Home Work Mobile   Imes,Chip Son [phone]  [phone]   Vantine,[name] Daughter   [phone]   Simao,Donna Relative [phone]  [phone]     Current Medical History  Patient Admitting Diagnosis: intracranial hemorrhage History of Present Illness: Danielle Clarke is a  78 year old right-handed female with history of diabetes mellitus maintained on Tresiba 10 mg daily, hyperlipidemia , hypertension, mild aortic stenosis, carotid artery disease maintained on aspirin 81 mg daily, anterior cervical corpectomy, recurrent syncopal episodes followed by cardiology services. Presented 02/07/2019 after syncopal episode while at the dental office. Daughter reports that she struck her head. No seizure activity noted. Blood sugars greater than 200. Cranial CT/MRI showed left frontal intraparenchymal hematoma with volume of approximately 10 mL. Posterior right parietal scalp hematoma consistent  with contrecoup pattern of injury. No acute fracture. No underlying mass lesion. There was a 4 mm left-to-right midline shift. No hydrocephalus. Neurosurgery Dr. Kathyrn Sheriff advise conservative care. Noted blood pressure elevated maintained on nifedipine drip as well as heart rate 100s. Echocardiogram with ejection fraction of 67% normal systolic function. EP follow-up for multiple episodes of intermittent high-grade heart block identified on telemetry followed by cardiology services in the past with troponin negative. 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 Osie Cheeks. 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 Plan currently is to continue to monitor no plan for endoscopy at this time. No further episodes reported hemoglobin remains stable 11.8. Patient currently a full liquid diet nasogastric tube in place for nutritional supportthat was later discontinued. Close monitoring of blood pressure on Lopressor 25 mg twice a day.   Complete NIHSS TOTAL: 5  Past Medical History      Past Medical History:  Diagnosis Date  . Aortic stenosis, mild   . Arthritis   . Asthmatic bronchitis    with colds per patient  .  Carotid artery disease (HCC)    40-59% bilateral ICA stenosis  . Cataract   . Cutaneous abscess of right foot   . Glaucoma   . Heart murmur    Dr Tamala Julian is her  cardiologist.   . Hyperlipemia   . Hypertension    Dr. Tamala Julian ~ 2 years ago  . Neck fracture [facility])    july 2013  . Osteoporosis   . Syncope   . Type 1 diabetes mellitus (HCC)     Family History  family history includes Cancer in her mother; Heart Problems in her father.  Prior Rehab/Hospitalizations:  Has the patient had major surgery during 100 days prior to admission? Pt had a permanent pacemaker installed on 02/11/2019  Current Medications   Current Facility-Administered Medications:  .  acetaminophen (TYLENOL) tablet 650 mg, 650 mg, Oral, Q6H PRN, Cherene Altes, MD .  dextrose 5 % solution, , Intravenous, Continuous, Samtani, Jai-Gurmukh, MD, Last Rate: 75 mL/hr at 02/14/19 0131 .  feeding supplement (GLUCERNA SHAKE) (GLUCERNA SHAKE) liquid 237 mL, 237 mL, Oral, TID BM, Samtani, Jai-Gurmukh, MD, 237 mL at 02/14/19 0934 .  insulin aspart (novoLOG) injection 0-15 Units, 0-15 Units, Subcutaneous, Q4H, Cherene Altes, MD, 2 Units at 02/14/19 0818 .  insulin glargine (LANTUS) injection 10 Units, 10 Units, Subcutaneous, BID, Guilford Shi, MD, 10 Units at 02/14/19 0935 .  lidocaine (LIDODERM) 5 % 1 patch, 1 patch, Transdermal, Q24H, Kamineni, Neelima, MD .  MEDLINE mouth rinse, 15 mL, Mouth Rinse, q12n4p, Evans Lance, MD, 15 mL at 02/14/19 0935 .  metoprolol tartrate (LOPRESSOR) tablet 25 mg, 25 mg, Oral, BID, Evans Lance, MD, 25 mg at 02/14/19 0933 .  ondansetron (ZOFRAN) injection 4 mg, 4 mg, Intravenous, Q6H PRN, Evans Lance, MD .  pantoprazole sodium (PROTONIX) 40 mg/20 mL oral suspension 40 mg, 40 mg, Oral, BID, Guilford Shi, MD  Patients Current Diet:     Diet Order                  Diet full liquid Room service appropriate? Yes with Assist; Fluid consistency:  Thin; Fluid restriction: Other (see comments)  Diet effective now               Precautions / Restrictions Precautions Precautions: Fall, ICD/Pacemaker Precaution  Comments: NG tube Restrictions Weight Bearing Restrictions: Yes LUE Weight Bearing: Non weight bearing   Has the patient had 2 or more falls or a fall with injury in the past year?Yes, the one that led to this admission  Prior Activity Level Limited Community (1-2x/wk): independent, still driving, went out to doctor's appointments and to meet friends several times a week  Development worker, international aid / Equipment Home Equipment: Environmental consultant - 4 wheels, Bedside commode, Cane - quad, Shower seat  Prior Device Use: Indicate devices/aids used by the patient prior to current illness, exacerbation or injury? occasional use of SPC  Prior Functional Level Prior Function Level of Independence: Independent Comments: Performs all ADLs and IADLs including driving. Uses a cane as she wants  Self Care: Did the patient need help bathing, dressing, using the toilet or eating?  Independent  Indoor Mobility: Did the patient need assistance with walking from room to room (with or without device)? Independent  Stairs: Did the patient need assistance with internal or external stairs (with or without device)? Independent  Functional Cognition: Did the patient need help planning regular tasks such as shopping or remembering to take medications? Independent  Current Functional Level Cognition  Arousal/Alertness: Lethargic Overall Cognitive Status: Impaired/Different from baseline Current Attention Level: Focused Orientation Level: Oriented to person, Oriented to place, Oriented to situation Following Commands: Follows one step commands inconsistently, Follows one step commands with increased time Safety/Judgement: Decreased awareness of deficits, Decreased awareness of safety General Comments: pt talking more throughout session  but complaining more of fatigue, pain and dizziness, required frequent encouragement  Attention: Sustained Sustained Attention: Impaired Sustained Attention Impairment: Verbal basic, Functional basic Memory: (TBA) Awareness: Impaired Awareness Impairment: Emergent impairment, Anticipatory impairment Problem Solving: Impaired Problem Solving Impairment: Functional basic Behaviors: Restless, Impulsive Safety/Judgment: Impaired    Extremity Assessment (includes Sensation/Coordination)  Upper Extremity Assessment: Generalized weakness  Lower Extremity Assessment: Defer to PT evaluation    ADLs  Overall ADL's : Needs assistance/impaired Eating/Feeding: NPO Grooming: Wash/dry face, Maximal assistance, Sitting Grooming Details (indicate cue type and reason): Max A due to poor following of cues Upper Body Bathing: Moderate assistance, Sitting Lower Body Bathing: Maximal assistance, Sit to/from stand Upper Body Dressing : Moderate assistance, Sitting Lower Body Dressing: Maximal assistance, +2 for safety/equipment, Sit to/from stand Lower Body Dressing Details (indicate cue type and reason): pt able to bring LLE into figure 4 with assist provided to maintain position to don sock, pt able to thread sock partially onto L foot, required assist to fully bring over heel (fatigued with attemtps) Toilet Transfer: Minimal assistance, Moderate assistance, +2 for safety/equipment, RW, BSC, Stand-pivot Toileting- Clothing Manipulation and Hygiene: Maximal assistance, +2 for physical assistance, +2 for safety/equipment, Sit to/from stand Toileting - Clothing Manipulation Details (indicate cue type and reason): pt noted to have incontinent BM upon standing from EOB, transferred to [facility] for further toileting, required assist for gown management and peri-care Functional mobility during ADLs: Minimal assistance, Moderate assistance, Rolling walker, +2 for safety/equipment General ADL Comments: pt with  increased fatigue, weakness, cognitive impairments    Mobility  Overal bed mobility: Needs Assistance Bed Mobility: Supine to Sit Rolling: Min assist Sidelying to sit: Min assist, HOB elevated Supine to sit: Min assist General bed mobility comments: assist to elevate trunk and scooting hips forward to square up EOB, pt reported dizziness upon sitting EOB, BP 142/77    Transfers  Overall transfer level: Needs assistance Equipment used: Rolling walker (2 wheeled) Transfers: Sit to/from Stand Sit to  Stand: Mod assist, +2 physical assistance General transfer comment: x4 trials from bed and recliner, cuing for hand placement, assist to rise, pt required frequent encouragement     Ambulation / Gait / Stairs / Wheelchair Mobility  Ambulation/Gait Ambulation/Gait assistance: Mod assist, +2 safety/equipment Gait Distance (Feet): 45 Feet Assistive device: Rolling walker (2 wheeled) Gait Pattern/deviations: Step-through pattern, Decreased stride length, Shuffle, Narrow base of support, Trunk flexed General Gait Details: pt very hesitant to ambulate today requiring encouragement, pt reported a fear of leaving the room, pt had frequent complaints of dizziness and fatigue BP 130/80, pt ambulated 5, 15, 45 feet requiring sitting rest breaks in between Gait velocity: decreased    Posture / Balance Dynamic Sitting Balance Sitting balance - Comments: pt requires BUE support sitting EOB Balance Overall balance assessment: Needs assistance Sitting-balance support: Bilateral upper extremity supported, Feet supported Sitting balance-Leahy Scale: Fair Sitting balance - Comments: pt requires BUE support sitting EOB Standing balance support: Bilateral upper extremity supported, During functional activity Standing balance-Leahy Scale: Poor Standing balance comment: pt requires bil UE support and external assist    Special needs/care consideration BiPAP/CPAP no CPM no Continuous Drip IV no  Dialysis no       Life Vest no Oxygen no Special Bed no Trach Size no Wound Vac (area) no Skin petechiae to abdomen, laceration to posterior scalp with steristrips in place  Bowel mgmt: continent, last BM 02/13/2019 Bladder mgmt: purwick Diabetic mgmt insulin     Previous Home Environment Living Arrangements: Alone  Lives With: Alone Available Help at Discharge: Family, Available 24 hours/day Type of Home: House Home Layout: One level Home Access: Stairs to enter Entrance Stairs-Rails: Right(daughter reports can easily get handrails and/or ramp built) Technical brewer of Steps: 2 Bathroom Shower/Tub: Chiropodist: Handicapped height Additional Comments: daughter is flight attendant with flexible schedule, is working to try and take leave of absence for next month, son and daughter in Corporate investment banker in [place] and able to help as well  Discharge Living Setting Plans for Discharge Living Setting: Patient's home Type of Home at Discharge: House Discharge Home Layout: One level Discharge Home Access: Stairs to enter Entrance Stairs-Rails: Right Entrance Stairs-Number of Steps: 2 Discharge Bathroom Shower/Tub: Tub/shower unit Discharge Bathroom Toilet: Handicapped height Discharge Bathroom Accessibility: Yes How Accessible: Accessible via walker Does the patient have any problems obtaining your medications?: No  Social/Family/Support Systems Patient Roles: Parent Anticipated Caregiver: Daughter, [name] Anticipated Caregiver's Contact Information: [phone] Ability/Limitations of Caregiver: daughter is a Catering manager; plans to take a leave from work and will share caregiver duties with her brother Caregiver Availability: 24/7 Discharge Plan Discussed with Primary Caregiver: Yes Is Caregiver In Agreement with Plan?: Yes Does Caregiver/Family have Issues with Lodging/Transportation while Pt is in Rehab?: No   Goals/Additional Needs  Patient/Family Goal for Rehab: PT/OT/SLP supervision Expected length of stay: 8-13 days Cultural Considerations: none Dietary Needs: SLP following for dysphagia Pt/Family Agrees to Admission and willing to participate: Yes Program Orientation Provided & Reviewed with Pt/Caregiver Including Roles  & Responsibilities: Yes   Possible need for SNF placement upon discharge: not anticipated.  Spoke with daughter, Claudine Mouton, at bedside on 02/14/2019 that SNF coverage following inpatient rehab stay was unlikely.     Patient Condition: This patient's condition remains as documented in the consult dated 02/12/2019, in which the Rehabilitation Physician determined and documented that the patient's condition is appropriate for intensive rehabilitative care in an inpatient rehabilitation facility. Will admit to inpatient rehab today.  Preadmission Screen Completed By:  Michel Santee, 02/14/2019 11:11 AM ______________________________________________________________________   Discussed status with Dr. Naaman Plummer on 02/14/19 at 11:11 AM  and received telephone approval for admission today.  Admission Coordinator:  Michel Santee, PT, DPT, time 11:11 AM Sudie Grumbling 02/14/19            Cosigned by: Meredith Staggers, MD at 02/14/2019 11:29 AM  Revision History

## 2019-02-14 NOTE — H&P (Signed)
Physical Medicine and Rehabilitation Admission H&P        Chief Complaint  Patient presents with  . Loss of Consciousness    Chief complaint:hungry   HPI: Danielle Clarke is a 78 year old right-handed female with history of diabetes mellitus maintained on Tresiba 10 mg daily, hyperlipidemia , hypertension, mild aortic stenosis, carotid artery disease maintained on aspirin 81 mg daily, anterior cervical corpectomy, recurrent syncopal episodes followed by cardiology services. Per chart review patient and daughter, patient lives alone. One level home with 4 steps to entry. Perform all ADLs and driving. She does use a cane when needed. Family in the area works. Presented 02/07/2019 after syncopal episode while at the dental office. Daughter reports that she struck her head. No seizure activity noted. Blood sugars greater than 200. Cranial CT/MRI showed left frontal intraparenchymal hematoma with volume of approximately 10 mL. Posterior right parietal scalp hematoma consistent with contrecoup pattern of injury. No acute fracture. No underlying mass lesion. There was a 4 mm left-to-right midline shift. No hydrocephalus. Neurosurgery Dr. Conchita Paris advise conservative care. Noted blood pressure elevated maintained on nifedipine drip as well as heart rate 100s. Echocardiogram with ejection fraction of 65% normal systolic function. EP follow-up for multiple episodes of intermittent high-grade heart block identified on telemetry followed by cardiology services in the past with troponin negative. 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 Gilman Schmidt. 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 Plan currently is to continue to monitor no plan for endoscopy at this time. No further episodes reported hemoglobin remains stable 11.8.  Patient currently a full liquid diet nasogastric tube in place for nutritional support that was later discontinued. Close monitoring of blood pressure on Lopressor 25 mg twice a day. Therapy evaluations completed with recommendations of physical medicine rehabilitation consult. Patient was admitted for a comprehensive rehabilitation program.   Review of Systems  Constitutional: Negative for chills and fever.  HENT: Negative for hearing loss.   Eyes: Negative for blurred vision and double vision.  Respiratory: Negative for cough and shortness of breath.   Cardiovascular: Negative for chest pain, palpitations and leg swelling.  Gastrointestinal: Positive for constipation. Negative for heartburn, nausea and vomiting.  Musculoskeletal: Positive for falls, joint pain and myalgias.  Skin: Negative for rash.  Neurological: Positive for dizziness and headaches. Negative for seizures and loss of consciousness.  All other systems reviewed and are negative.       Past Medical History:  Diagnosis Date  . Aortic stenosis, mild    . Arthritis    . Asthmatic bronchitis      with colds per patient  . Carotid artery disease (HCC)      40-59% bilateral ICA stenosis  . Cataract    . Cutaneous abscess of right foot    . Glaucoma    . Heart murmur      Dr Katrinka Blazing is her  cardiologist.   . Hyperlipemia    . Hypertension      Dr. Katrinka Blazing ~ 2 years ago  . Neck fracture [facility])      july 2013  . Osteoporosis    . Syncope    . Type 1 diabetes mellitus (HCC)           Past Surgical History:  Procedure Laterality Date  . ANKLE FRACTURE SURGERY Left 2001    steel plate and 3  screws   . ANTERIOR CERVICAL CORPECTOMY N/A 07/31/2014    Procedure: Cervical Four to Cervical Six Corpectomy;  Surgeon: Karn Cassis, MD;  Location: [facility]URO ORS;  Service: Neurosurgery;  Laterality: N/A;  C4 to C6 Corpectomy  . BREAST SURGERY   1988  . CATARACT EXTRACTION W/ INTRAOCULAR LENS  IMPLANT, BILATERAL Bilateral 2011     right and then left  . EYE SURGERY      . FRACTURE SURGERY      . HAMMER TOE SURGERY   1998  . I&D EXTREMITY Right 09/27/2018    Procedure: IRRIGATION AND DEBRIDEMENT RIGHT FOOT;  Surgeon: Nadara Mustard, MD;  Location: [facility] OR;  Service: Orthopedics;  Laterality: Right;  . JOINT REPLACEMENT      . PACEMAKER IMPLANT N/A 02/10/2019    Procedure: PACEMAKER IMPLANT;  Surgeon: Marinus Maw, MD;  Location: [facility] INVASIVE CV LAB;  Service: Cardiovascular;  Laterality: N/A;  . TONSILLECTOMY AND ADENOIDECTOMY   1948  . TOTAL SHOULDER REPLACEMENT   2010    right shoulder   . TUBAL LIGATION   1979  . TYMPANOSTOMY TUBE PLACEMENT Bilateral           Family History  Problem Relation Age of Onset  . Cancer Mother    . Heart Problems Father      Social History:  reports that she has never smoked. She has never used smokeless tobacco. She reports current alcohol use of about 7.0 standard drinks of alcohol per week. No history on file for drug. Allergies:       Allergies  Allergen Reactions  . Cefaclor Anaphylaxis  . Tetracycline Anaphylaxis  . Vancomycin Anaphylaxis  . Augmentin [Amoxicillin-Pot Clavulanate] Other (See Comments)      Reaction unknown >> Anaphylaxis Has patient had a PCN reaction causing immediate rash, facial/tongue/throat swelling, SOB or lightheadedness with hypotension: Unknown Has patient had a PCN reaction causing severe rash involving mucus membranes or skin necrosis: Unknown Has patient had a PCN reaction that required hospitalization: Unknown Has patient had a PCN reaction occurring within the last 10 years: Unknown If all of the above answers are "NO", then may proceed with Cephalosporin use.    . Prednisone Other (See Comments)      Reaction unknown          Medications Prior to Admission  Medication Sig Dispense Refill  . acetaminophen (TYLENOL) 500 MG tablet Take 500 mg by mouth 4 (four) times daily.       Marland Kitchen aspirin EC 81 MG tablet Take 81 mg by mouth daily.       . Chromium Picolinate 1000 MCG TABS Take 1,000 mcg by mouth daily at 12 noon.       Marland Kitchen CINNAMON PO Take 1,000 mg by mouth 4 (four) times daily.      Marland Kitchen HUMALOG KWIKPEN 100 UNIT/ML KiwkPen Inject 2-13 Units into the skin 4 (four) times daily. Sliding Scale Insulin   3  . Multiple Vitamin (MULTIVITAMIN WITH MINERALS) TABS tablet Take 1 tablet by mouth daily.      . Probiotic Product (PROBIOTIC PO) Take 1 tablet by mouth daily.      . simvastatin (ZOCOR) 40 MG tablet Take 40 mg by mouth daily at 8 pm.       . traMADol (ULTRAM) 50 MG tablet Take 1 tablet (50 mg total) by mouth every 6 (six) hours as needed for moderate pain. 30 tablet 0  . TRESIBA 100 UNIT/ML SOLN Inject 10 Units  into the skin daily.    6  . Turmeric 500 MG CAPS Take 500 mg by mouth daily at 12 noon.       Marland Kitchen albuterol (PROVENTIL HFA;VENTOLIN HFA) 108 (90 Base) MCG/ACT inhaler Inhale 2 puffs into the lungs every 6 (six) hours as needed for wheezing or shortness of breath. (Patient not taking: Reported on 02/06/2019) 1 Inhaler 2  . clindamycin (CLEOCIN) 300 MG capsule Take 1 capsule (300 mg total) by mouth 3 (three) times daily. (Patient not taking: Reported on 02/06/2019) 42 capsule 0  . mupirocin ointment (BACTROBAN) 2 % Apply 1 application topically daily. Apply to right foot incision daily (Patient not taking: Reported on 02/06/2019) 22 g 0  . ondansetron (ZOFRAN ODT) 4 MG disintegrating tablet Take 1 tablet (4 mg total) by mouth every 8 (eight) hours as needed for nausea or vomiting. (Patient not taking: Reported on 02/06/2019) 20 tablet 0  . ondansetron (ZOFRAN) 4 MG tablet Take 1 tablet (4 mg total) by mouth every 8 (eight) hours as needed for nausea or vomiting. (Patient not taking: Reported on 02/06/2019) 20 tablet 0  . sulfamethoxazole-trimethoprim (BACTRIM DS,SEPTRA DS) 800-160 MG tablet Take 1 tablet by mouth 2 (two) times daily. (Patient not taking: Reported on 02/06/2019) 6 tablet 0  . sulfamethoxazole-trimethoprim (BACTRIM DS,SEPTRA  DS) 800-160 MG tablet Take 1 tablet by mouth 2 (two) times daily. (Patient not taking: Reported on 02/06/2019) 40 tablet 0  . triamcinolone (KENALOG) 0.025 % ointment Apply 1 application topically 2 (two) times daily. (Patient not taking: Reported on 02/06/2019) 30 g 0      Drug Regimen Review Drug regimen was reviewed and remains appropriate with no significant issues identified   Home: Home Living Family/patient expects to be discharged to:: Private residence Living Arrangements: Alone Available Help at Discharge: Family, Available PRN/intermittently Type of Home: House Home Access: Stairs to enter [employer] of Steps: 2 Entrance Stairs-Rails: Right(daughter reports can easily get handrails and/or ramp built) Home Layout: One level Bathroom Shower/Tub: Engineer, manufacturing systems: Handicapped height Home Equipment: Environmental consultant - 4 wheels, Bedside commode, Cane - quad, Shower seat Additional Comments: daughter is flight attendant with flexible schedule, is working to try and take leave of absence for next month, son and daughter in Hospital doctor in [place] and able to help as well  Lives With: Alone   Functional History: Prior Function Level of Independence: Independent Comments: Performs all ADLs and IADLs including driving. Uses a cane as she wants   Functional Status:  Mobility: Bed Mobility Overal bed mobility: Needs Assistance Bed Mobility: Supine to Sit Rolling: Min assist Sidelying to sit: Min assist, HOB elevated Supine to sit: Min assist General bed mobility comments: assist to elevate trunk and scooting hips forward to square up EOB, pt reported dizziness upon sitting EOB, BP 142/77 Transfers Overall transfer level: Needs assistance Equipment used: Rolling walker (2 wheeled) Transfers: Sit to/from Stand Sit to Stand: Mod assist, +2 physical assistance General transfer comment: x4 trials from bed and recliner, cuing for hand placement, assist to rise, pt  required frequent encouragement  Ambulation/Gait Ambulation/Gait assistance: Mod assist, +2 safety/equipment Gait Distance (Feet): 45 Feet Assistive device: Rolling walker (2 wheeled) Gait Pattern/deviations: Step-through pattern, Decreased stride length, Shuffle, Narrow base of support, Trunk flexed General Gait Details: pt very hesitant to ambulate today requiring encouragement, pt reported a fear of leaving the room, pt had frequent complaints of dizziness and fatigue BP 130/80, pt ambulated 5, 15, 45 feet requiring sitting rest breaks  in between Gait velocity: decreased   ADL: ADL Overall ADL's : Needs assistance/impaired Eating/Feeding: NPO Grooming: Wash/dry face, Maximal assistance, Sitting Grooming Details (indicate cue type and reason): Max A due to poor following of cues Upper Body Bathing: Moderate assistance, Sitting Lower Body Bathing: Maximal assistance, Sit to/from stand Upper Body Dressing : Moderate assistance, Sitting Lower Body Dressing: Maximal assistance, +2 for safety/equipment, Sit to/from stand Lower Body Dressing Details (indicate cue type and reason): pt able to bring LLE into figure 4 with assist provided to maintain position to don sock, pt able to thread sock partially onto L foot, required assist to fully bring over heel (fatigued with attemtps) Toilet Transfer: Minimal assistance, Moderate assistance, +2 for safety/equipment, RW, BSC, Stand-pivot Toileting- Clothing Manipulation and Hygiene: Maximal assistance, +2 for physical assistance, +2 for safety/equipment, Sit to/from stand Toileting - Clothing Manipulation Details (indicate cue type and reason): pt noted to have incontinent BM upon standing from EOB, transferred to [facility] for further toileting, required assist for gown management and peri-care Functional mobility during ADLs: Minimal assistance, Moderate assistance, Rolling walker, +2 for safety/equipment General ADL Comments: pt with increased fatigue,  weakness, cognitive impairments   Cognition: Cognition Overall Cognitive Status: Impaired/Different from baseline Arousal/Alertness: Lethargic Orientation Level: Oriented to person, Oriented to place, Oriented to situation Attention: Sustained Sustained Attention: Impaired Sustained Attention Impairment: Verbal basic, Functional basic Memory: (TBA) Awareness: Impaired Awareness Impairment: Emergent impairment, Anticipatory impairment Problem Solving: Impaired Problem Solving Impairment: Functional basic Behaviors: Restless, Impulsive Safety/Judgment: Impaired Cognition Arousal/Alertness: Awake/alert Behavior During Therapy: Flat affect Overall Cognitive Status: Impaired/Different from baseline Area of Impairment: Orientation, Problem solving, Attention, Memory, Following commands, Safety/judgement, Awareness Orientation Level: Disoriented to, Time Current Attention Level: Focused Memory: Decreased recall of precautions, Decreased short-term memory Following Commands: Follows one step commands inconsistently, Follows one step commands with increased time Safety/Judgement: Decreased awareness of deficits, Decreased awareness of safety Awareness: Intellectual Problem Solving: Slow processing, Decreased initiation, Difficulty sequencing, Requires verbal cues General Comments: pt talking more throughout session but complaining more of fatigue, pain and dizziness, required frequent encouragement    Physical Exam: Blood pressure 138/73, pulse (!) 59, temperature 98.1 F (36.7 C), temperature source Oral, resp. rate 18, height 5' (1.524 m), weight 52.2 kg, SpO2 98 %. Physical Exam  Constitutional: She appears well-developed. No distress.  HENT:  Head: Normocephalic.  Eyes: Pupils are equal, round, and reactive to light. EOM are normal.  Neck: Normal range of motion. No tracheal deviation present. No thyromegaly present.  Cardiovascular: Normal rate and regular rhythm. Exam reveals no  friction rub.  No murmur heard. Respiratory: Effort normal. No respiratory distress. She has no wheezes.  GI: Soft. She exhibits no distension. There is no abdominal tenderness. There is no rebound and no guarding.  Musculoskeletal: Normal range of motion.        General: No deformity or edema.  Neurological:  Pt alert, oriented to [facility], knows she fell but doesn't remember anything about incident or immediately after. Fair insight and awareness.  Slow to process. CN without obvious abnl. Tracks to all fields, gaze conjugate, no diplopia. UE 4/5 prox to distal. LE 4- to 4/5 prox to distal. Senses pain and light touch in all 4      Skin: Skin is warm and dry.  Psychiatric:  Pleasant and cooperative. No anxiety or agitation      Lab Results Last 48 Hours        Results for orders placed or performed during the hospital encounter of  02/06/19 (from the past 48 hour(s))  Glucose, capillary     Status: Abnormal    Collection Time: 02/12/19  8:14 AM  Result Value Ref Range    Glucose-Capillary 197 (H) 70 - 99 mg/dL  Glucose, capillary     Status: Abnormal    Collection Time: 02/12/19 11:40 AM  Result Value Ref Range    Glucose-Capillary 392 (H) 70 - 99 mg/dL  Glucose, capillary     Status: Abnormal    Collection Time: 02/12/19  4:25 PM  Result Value Ref Range    Glucose-Capillary 377 (H) 70 - 99 mg/dL  Glucose, capillary     Status: Abnormal    Collection Time: 02/12/19  7:40 PM  Result Value Ref Range    Glucose-Capillary 272 (H) 70 - 99 mg/dL  Glucose, capillary     Status: Abnormal    Collection Time: 02/12/19 11:23 PM  Result Value Ref Range    Glucose-Capillary 199 (H) 70 - 99 mg/dL  Glucose, capillary     Status: Abnormal    Collection Time: 02/13/19  3:53 AM  Result Value Ref Range    Glucose-Capillary 54 (L) 70 - 99 mg/dL  Glucose, capillary     Status: Abnormal    Collection Time: 02/13/19  4:41 AM  Result Value Ref Range    Glucose-Capillary 105 (H) 70 - 99 mg/dL   Renal function panel     Status: Abnormal    Collection Time: 02/13/19  7:36 AM  Result Value Ref Range    Sodium 141 135 - 145 mmol/L    Potassium 3.5 3.5 - 5.1 mmol/L    Chloride 100 98 - 111 mmol/L    CO2 32 22 - 32 mmol/L    Glucose, Bld 160 (H) 70 - 99 mg/dL    BUN 16 8 - 23 mg/dL    Creatinine, Ser 1.61 0.44 - 1.00 mg/dL    Calcium 8.0 (L) 8.9 - 10.3 mg/dL    Phosphorus 2.0 (L) 2.5 - 4.6 mg/dL    Albumin 2.6 (L) 3.5 - 5.0 g/dL    GFR calc non Af Amer >60 >60 mL/min    GFR calc Af Amer >60 >60 mL/min    Anion gap 9 5 - 15      Comment: Performed at [facility], [address] [place], [place] [address]  Glucose, capillary     Status: Abnormal    Collection Time: 02/13/19  8:09 AM  Result Value Ref Range    Glucose-Capillary 134 (H) 70 - 99 mg/dL  Glucose, capillary     Status: Abnormal    Collection Time: 02/13/19 12:03 PM  Result Value Ref Range    Glucose-Capillary 304 (H) 70 - 99 mg/dL  Glucose, capillary     Status: Abnormal    Collection Time: 02/13/19  3:51 PM  Result Value Ref Range    Glucose-Capillary 205 (H) 70 - 99 mg/dL  Glucose, capillary     Status: Abnormal    Collection Time: 02/13/19  7:45 PM  Result Value Ref Range    Glucose-Capillary 259 (H) 70 - 99 mg/dL  Glucose, capillary     Status: Abnormal    Collection Time: 02/13/19 11:54 PM  Result Value Ref Range    Glucose-Capillary 126 (H) 70 - 99 mg/dL  Basic metabolic panel     Status: Abnormal    Collection Time: 02/14/19  1:43 AM  Result Value Ref Range    Sodium 139 135 - 145  mmol/L    Potassium 3.2 (L) 3.5 - 5.1 mmol/L    Chloride 100 98 - 111 mmol/L    CO2 31 22 - 32 mmol/L    Glucose, Bld 108 (H) 70 - 99 mg/dL    BUN 12 8 - 23 mg/dL    Creatinine, Ser 6.01 0.44 - 1.00 mg/dL    Calcium 8.2 (L) 8.9 - 10.3 mg/dL    GFR calc non Af Amer >60 >60 mL/min    GFR calc Af Amer >60 >60 mL/min    Anion gap 8 5 - 15      Comment: Performed at [facility], [address],  [place], [place] [address]  Glucose, capillary     Status: Abnormal    Collection Time: 02/14/19  4:13 AM  Result Value Ref Range    Glucose-Capillary 101 (H) 70 - 99 mg/dL  Glucose, capillary     Status: Abnormal    Collection Time: 02/14/19  7:31 AM  Result Value Ref Range    Glucose-Capillary 150 (H) 70 - 99 mg/dL      Imaging Results (Last 48 hours)  No results found.           Medical Problem List and Plan: 1.  Decreased functional mobility secondary to traumatic left frontal intraparenchymal hematoma             -admit to inpatient rehab 2.  Antithrombotics: -DVT/anticoagulation:  SCDs             -antiplatelet therapy:  None secondary to intraparenchymal hematoma 3. Pain Management:  Lidoderm patch 4. Mood:  Provide emotional support             -antipsychotic agents:  none 5. Neuropsych: This patient is capable of making decisions on her own behalf. 6. Skin/Wound Care:  Routine skin checks 7. Fluids/Electrolytes/Nutrition: Routine in and out's with follow-up chemistries 8. 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 Follow-up per EP  9. Moderate severe aortic stenosis. Continue Lopressor 25 mg twice a day             -follow for tolerance of physical activity with therapies 10. Dysphagia. Currently on a clear liquid diet. Advance as per speech therapy. 11. Intermittent coffee-ground emesis. 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 Continue PPI.       -Follow-up gastroenterology services currently with conservative care.             -denies pain or discomfort at present 12. Diabetes mellitus with peripheral neuropathy.              -Currently on Lantus insulin 10 units twice daily.              -Check blood sugars before meals and at bedtime.             - Patient had been on Tresiba 10 units daily prior to admission.       Post Admission Physician Evaluation: 1. Functional deficits secondary  to traumatic brain injury. 2. Patient is  admitted to receive collaborative, interdisciplinary care between the physiatrist, rehab nursing staff, and therapy team. 3. Patient's level of medical complexity and substantial therapy needs in context of that medical necessity cannot be provided at a lesser intensity of care such as a SNF. 4. Patient has experienced substantial functional loss from his/her baseline which was documented above under the "Functional History" and "Functional Status" headings.  Judging by the patient's diagnosis, physical exam, and functional history, the patient  has potential for functional progress which will result in measurable gains while on inpatient rehab.  These gains will be of substantial and practical use upon discharge  in facilitating mobility and self-care at the household level. 5. Physiatrist will provide 24 hour management of medical needs as well as oversight of the therapy plan/treatment and provide guidance as appropriate regarding the interaction of the two. 6. The Preadmission Screening has been reviewed and patient status is unchanged unless otherwise stated above. 7. 24 hour rehab nursing will assist with bladder management, bowel management, safety, skin/wound care, disease management, medication administration, pain management and patient education  and help integrate therapy concepts, techniques,education, etc. 8. PT will assess and treat for/with: Lower extremity strength, range of motion, stamina, balance, functional mobility, safety, adaptive techniques and equipment, NMR, family education, cognitive perceptual awareness.   Goals are: supervision. 9. OT will assess and treat for/with: ADL's, functional mobility, safety, upper extremity strength, adaptive techniques and equipment, NMR, family ed, community reentry.   Goals are: supervision. Therapy may proceed with showering this patient. 10. SLP will assess and treat for/with: cognition, family ed.  Goals are: supervision. 11. Case Management and  Social Worker will assess and treat for psychological issues and discharge planning. 12. Team conference will be held weekly to assess progress toward goals and to determine barriers to discharge. 13. Patient will receive at least 3 hours of therapy per day at least 5 days per week. 14. ELOS: 8-13 days       15. Prognosis:  excellent   I have personally performed a face to face diagnostic evaluation of this patient and formulated the key components of the plan.  Additionally, I have personally reviewed laboratory data, imaging studies, as well as relevant notes and concur with the physician assistant's documentation above.  Ranelle Oyster, MD, FAAPMR   Mcarthur Rossetti Angiulli, PA-C 02/14/2019

## 2019-02-14 NOTE — PMR Pre-admission (Signed)
PMR Admission Coordinator Pre-Admission Assessment  Patient: Danielle Clarke is an 77 y.o., female MRN: [id number] DOB: 08/23/1941 Height: 5' (152.4 cm) Weight: 52.2 kg              Insurance Information HMO:      PPO:  Yes     PCP:      IPA:      80/20:      OTHER:  PRIMARY: [employer] Medicare      Policy#: [id number]      Subscriber: patient CM Name: Dorthula Nettles      Phone#: [phone]     Fax#: [phone] Pre-Cert#: [id number] with updates due to Dorthula Nettles on 02/20/2019     Employer:  Benefits:  Phone #: [phone]     Name:  Eff. Date: 11/27/2018     Deduct: $0      Out of Pocket Max: $4000 (met $210.71)      Life Max: n/a CIR: $160/day for days 1-10, $0/day for remaining days with insurance approval  SNF: $0/ day for days 1-20, $50/day for days 21-100 Outpatient:      Co-Pay: $20/visit Home Health: 100%      Co-Pay: n/a DME: 80%     Co-Pay: 20% Providers: PPO  SECONDARY: n/a  Medicaid Application Date:       Case Manager:  Disability Application Date:       Case Worker:   Emergency Contact Information Contact Information    Name Relation Home Work Mobile   Henandez,Chip Son [phone]  [phone]   Kley,[name] Daughter   [phone]   Tipler,Donna Relative [phone]  [phone]     Current Medical History  Patient Admitting Diagnosis: intracranial hemorrhage History of Present Illness: Danielle Clarke is a 78 year old right-handed female with history of diabetes mellitus maintained on Tresiba 10 mg daily, hyperlipidemia , hypertension, mild aortic stenosis, carotid artery disease maintained on aspirin 81 mg daily, anterior cervical corpectomy, recurrent syncopal episodes followed by cardiology services. Presented 02/07/2019 after syncopal episode while at the dental office. Daughter reports that she struck her head. No seizure activity noted. Blood sugars greater than 200. Cranial CT/MRI showed left frontal intraparenchymal hematoma with volume of approximately 10  mL. Posterior right parietal scalp hematoma consistent with contrecoup pattern of injury. No acute fracture. No underlying mass lesion. There was a 4 mm left-to-right midline shift. No hydrocephalus. Neurosurgery Dr. Kathyrn Sheriff advise conservative care. Noted blood pressure elevated maintained on nifedipine drip as well as heart rate 100s. Echocardiogram with ejection fraction of 89% normal systolic function. EP follow-up for multiple episodes of intermittent high-grade heart block identified on telemetry followed by cardiology services in the past with troponin negative. 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 Osie Cheeks. 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 Plan currently is to continue to monitor no plan for endoscopy at this time. No further episodes reported hemoglobin remains stable 11.8. Patient currently a full liquid diet nasogastric tube in place for nutritional support that was later discontinued. Close monitoring of blood pressure on Lopressor 25 mg twice a day.   Complete NIHSS TOTAL: 5    Past Medical History  Past Medical History:  Diagnosis Date  . Aortic stenosis, mild   . Arthritis   . Asthmatic bronchitis    with colds per patient  . Carotid artery disease (HCC)    40-59% bilateral ICA stenosis  .  Cataract   . Cutaneous abscess of right foot   . Glaucoma   . Heart murmur    Dr Tamala Julian is her  cardiologist.   . Hyperlipemia   . Hypertension    Dr. Tamala Julian ~ 2 years ago  . Neck fracture [facility])    july 2013  . Osteoporosis   . Syncope   . Type 1 diabetes mellitus (HCC)     Family History  family history includes Cancer in her mother; Heart Problems in her father.  Prior Rehab/Hospitalizations:  Has the patient had major surgery during 100 days prior to admission? Pt had a permanent pacemaker  installed on 02/11/2019  Current Medications   Current Facility-Administered Medications:  .  acetaminophen (TYLENOL) tablet 650 mg, 650 mg, Oral, Q6H PRN, Cherene Altes, MD .  dextrose 5 % solution, , Intravenous, Continuous, Samtani, Jai-Gurmukh, MD, Last Rate: 75 mL/hr at 02/14/19 0131 .  feeding supplement (GLUCERNA SHAKE) (GLUCERNA SHAKE) liquid 237 mL, 237 mL, Oral, TID BM, Samtani, Jai-Gurmukh, MD, 237 mL at 02/14/19 0934 .  insulin aspart (novoLOG) injection 0-15 Units, 0-15 Units, Subcutaneous, Q4H, Cherene Altes, MD, 2 Units at 02/14/19 0818 .  insulin glargine (LANTUS) injection 10 Units, 10 Units, Subcutaneous, BID, Guilford Shi, MD, 10 Units at 02/14/19 0935 .  lidocaine (LIDODERM) 5 % 1 patch, 1 patch, Transdermal, Q24H, Kamineni, Neelima, MD .  MEDLINE mouth rinse, 15 mL, Mouth Rinse, q12n4p, Evans Lance, MD, 15 mL at 02/14/19 0935 .  metoprolol tartrate (LOPRESSOR) tablet 25 mg, 25 mg, Oral, BID, Evans Lance, MD, 25 mg at 02/14/19 0933 .  ondansetron (ZOFRAN) injection 4 mg, 4 mg, Intravenous, Q6H PRN, Evans Lance, MD .  pantoprazole sodium (PROTONIX) 40 mg/20 mL oral suspension 40 mg, 40 mg, Oral, BID, Guilford Shi, MD  Patients Current Diet:  Diet Order            Diet full liquid Room service appropriate? Yes with Assist; Fluid consistency: Thin; Fluid restriction: Other (see comments)  Diet effective now              Precautions / Restrictions Precautions Precautions: Fall, ICD/Pacemaker Precaution Comments: NG tube Restrictions Weight Bearing Restrictions: Yes LUE Weight Bearing: Non weight bearing   Has the patient had 2 or more falls or a fall with injury in the past year?Yes, the one that led to this admission  Prior Activity Level Limited Community (1-2x/wk): independent, still driving, went out to doctor's appointments and to meet friends several times a week  Development worker, international aid / Equipment Home Equipment: Environmental consultant - 4  wheels, Bedside commode, Cane - quad, Shower seat  Prior Device Use: Indicate devices/aids used by the patient prior to current illness, exacerbation or injury? occasional use of SPC  Prior Functional Level Prior Function Level of Independence: Independent Comments: Performs all ADLs and IADLs including driving. Uses a cane as she wants  Self Care: Did the patient need help bathing, dressing, using the toilet or eating?  Independent  Indoor Mobility: Did the patient need assistance with walking from room to room (with or without device)? Independent  Stairs: Did the patient need assistance with internal or external stairs (with or without device)? Independent  Functional Cognition: Did the patient need help planning regular tasks such as shopping or remembering to take medications? Independent  Current Functional Level Cognition  Arousal/Alertness: Lethargic Overall Cognitive Status: Impaired/Different from baseline Current Attention Level: Focused Orientation Level: Oriented to person, Oriented to place, Oriented  to situation Following Commands: Follows one step commands inconsistently, Follows one step commands with increased time Safety/Judgement: Decreased awareness of deficits, Decreased awareness of safety General Comments: pt talking more throughout session but complaining more of fatigue, pain and dizziness, required frequent encouragement  Attention: Sustained Sustained Attention: Impaired Sustained Attention Impairment: Verbal basic, Functional basic Memory: (TBA) Awareness: Impaired Awareness Impairment: Emergent impairment, Anticipatory impairment Problem Solving: Impaired Problem Solving Impairment: Functional basic Behaviors: Restless, Impulsive Safety/Judgment: Impaired    Extremity Assessment (includes Sensation/Coordination)  Upper Extremity Assessment: Generalized weakness  Lower Extremity Assessment: Defer to PT evaluation    ADLs  Overall ADL's : Needs  assistance/impaired Eating/Feeding: NPO Grooming: Wash/dry face, Maximal assistance, Sitting Grooming Details (indicate cue type and reason): Max A due to poor following of cues Upper Body Bathing: Moderate assistance, Sitting Lower Body Bathing: Maximal assistance, Sit to/from stand Upper Body Dressing : Moderate assistance, Sitting Lower Body Dressing: Maximal assistance, +2 for safety/equipment, Sit to/from stand Lower Body Dressing Details (indicate cue type and reason): pt able to bring LLE into figure 4 with assist provided to maintain position to don sock, pt able to thread sock partially onto L foot, required assist to fully bring over heel (fatigued with attemtps) Toilet Transfer: Minimal assistance, Moderate assistance, +2 for safety/equipment, RW, BSC, Stand-pivot Toileting- Clothing Manipulation and Hygiene: Maximal assistance, +2 for physical assistance, +2 for safety/equipment, Sit to/from stand Toileting - Clothing Manipulation Details (indicate cue type and reason): pt noted to have incontinent BM upon standing from EOB, transferred to [facility] for further toileting, required assist for gown management and peri-care Functional mobility during ADLs: Minimal assistance, Moderate assistance, Rolling walker, +2 for safety/equipment General ADL Comments: pt with increased fatigue, weakness, cognitive impairments    Mobility  Overal bed mobility: Needs Assistance Bed Mobility: Supine to Sit Rolling: Min assist Sidelying to sit: Min assist, HOB elevated Supine to sit: Min assist General bed mobility comments: assist to elevate trunk and scooting hips forward to square up EOB, pt reported dizziness upon sitting EOB, BP 142/77    Transfers  Overall transfer level: Needs assistance Equipment used: Rolling walker (2 wheeled) Transfers: Sit to/from Stand Sit to Stand: Mod assist, +2 physical assistance General transfer comment: x4 trials from bed and recliner, cuing for hand placement,  assist to rise, pt required frequent encouragement     Ambulation / Gait / Stairs / Wheelchair Mobility  Ambulation/Gait Ambulation/Gait assistance: Mod assist, +2 safety/equipment Gait Distance (Feet): 45 Feet Assistive device: Rolling walker (2 wheeled) Gait Pattern/deviations: Step-through pattern, Decreased stride length, Shuffle, Narrow base of support, Trunk flexed General Gait Details: pt very hesitant to ambulate today requiring encouragement, pt reported a fear of leaving the room, pt had frequent complaints of dizziness and fatigue BP 130/80, pt ambulated 5, 15, 45 feet requiring sitting rest breaks in between Gait velocity: decreased    Posture / Balance Dynamic Sitting Balance Sitting balance - Comments: pt requires BUE support sitting EOB Balance Overall balance assessment: Needs assistance Sitting-balance support: Bilateral upper extremity supported, Feet supported Sitting balance-Leahy Scale: Fair Sitting balance - Comments: pt requires BUE support sitting EOB Standing balance support: Bilateral upper extremity supported, During functional activity Standing balance-Leahy Scale: Poor Standing balance comment: pt requires bil UE support and external assist    Special needs/care consideration BiPAP/CPAP no CPM no Continuous Drip IV no Dialysis no       Life Vest no Oxygen no Special Bed no Trach Size no Wound Vac (area) no Skin petechiae to  abdomen, laceration to posterior scalp with steristrips in place  Bowel mgmt: continent, last BM 02/13/2019 Bladder mgmt: purwick Diabetic mgmt insulin     Previous Home Environment Living Arrangements: Alone  Lives With: Alone Available Help at Discharge: Family, Available 24 hours/day Type of Home: House Home Layout: One level Home Access: Stairs to enter Entrance Stairs-Rails: Right(daughter reports can easily get handrails and/or ramp built) Technical brewer of Steps: 2 Bathroom Shower/Tub: Scientist, forensic: Handicapped height Additional Comments: daughter is flight attendant with flexible schedule, is working to try and take leave of absence for next month, son and daughter in Corporate investment banker in [place] and able to help as well  Discharge Living Setting Plans for Discharge Living Setting: Patient's home Type of Home at Discharge: House Discharge Home Layout: One level Discharge Home Access: Stairs to enter Entrance Stairs-Rails: Right Entrance Stairs-Number of Steps: 2 Discharge Bathroom Shower/Tub: Tub/shower unit Discharge Bathroom Toilet: Handicapped height Discharge Bathroom Accessibility: Yes How Accessible: Accessible via walker Does the patient have any problems obtaining your medications?: No  Social/Family/Support Systems Patient Roles: Parent Anticipated Caregiver: Daughter, [name] Anticipated Caregiver's Contact Information: [phone] Ability/Limitations of Caregiver: daughter is a Catering manager; plans to take a leave from work and will share caregiver duties with her brother Caregiver Availability: 24/7 Discharge Plan Discussed with Primary Caregiver: Yes Is Caregiver In Agreement with Plan?: Yes Does Caregiver/Family have Issues with Lodging/Transportation while Pt is in Rehab?: No   Goals/Additional Needs Patient/Family Goal for Rehab: PT/OT/SLP supervision Expected length of stay: 8-13 days Cultural Considerations: none Dietary Needs: SLP following for dysphagia Pt/Family Agrees to Admission and willing to participate: Yes Program Orientation Provided & Reviewed with Pt/Caregiver Including Roles  & Responsibilities: Yes   Possible need for SNF placement upon discharge: not anticipated.  Spoke with daughter, Claudine Mouton, at bedside on 02/14/2019 that SNF coverage following inpatient rehab stay was unlikely.     Patient Condition: This patient's condition remains as documented in the consult dated 02/12/2019, in which the Rehabilitation Physician determined  and documented that the patient's condition is appropriate for intensive rehabilitative care in an inpatient rehabilitation facility. Will admit to inpatient rehab today.  Preadmission Screen Completed By:  Michel Santee, 02/14/2019 11:11 AM ______________________________________________________________________   Discussed status with Dr. Naaman Plummer on 02/14/19 at 11:11 AM  and received telephone approval for admission today.  Admission Coordinator:  Michel Santee, PT, DPT, time 11:11 AM Sudie Grumbling 02/14/19

## 2019-02-14 NOTE — Evaluation (Signed)
Physical Therapy Assessment and Plan  Patient Details  Name: Danielle Clarke MRN: [id number] Date of Birth: 1941/02/10  PT Diagnosis: Abnormal posture, Abnormality of gait, Cognitive deficits, Difficulty walking and Pain in R rib cage Rehab Potential: Good ELOS: 10-14 days   Today's Date: 02/15/2019 PT Individual Time: 8016-5537 PT Individual Time Calculation (min): 60 min  and Today's Date: 02/15/2019 PT Missed Time: 15 Minutes Missed Time Reason: Patient fatigue;Pain   Problem List:  Patient Active Problem List   Diagnosis Date Noted  . Traumatic cerebral intraparenchymal hematoma ([place]) 02/14/2019  . Pacemaker   . Labile blood glucose   . Diabetes mellitus type 2 in nonobese (HCC)   . Dyslipidemia   . Essential hypertension   . Hypernatremia   . Anemia of chronic disease   . Hypertensive urgency 02/07/2019  . Intracerebral hemorrhage ([place]) 02/07/2019  . Intracranial bleeding ([place]) 02/06/2019  . Elevated troponin   . Abnormal EKG   . Bronchitis 08/05/2017  . Bilateral carotid artery stenosis   . Aortic stenosis 08/25/2015  . Cervical stenosis of spinal canal 07/31/2014  . CKD (chronic kidney disease) stage 2, GFR 60-89 ml/min 12/30/2013  . Essential hypertension, benign 12/30/2013  . Hyperlipidemia 12/30/2013  . Toe infection 12/30/2013  . Anemia 12/30/2013  . Acute renal failure ([place]) 12/29/2013  . Syncope 01/02/2013  . Diabetes mellitus type 1 ([place]) 01/02/2013  . Bilateral carotid artery disease ([place]) 01/02/2013  . GOITER, MULTINODULAR 02/11/2008    Past Medical History:  Past Medical History:  Diagnosis Date  . Aortic stenosis, mild   . Arthritis   . Asthmatic bronchitis    with colds per patient  . Carotid artery disease (HCC)    40-59% bilateral ICA stenosis  . Cataract   . Cutaneous abscess of right foot   . Glaucoma   . Heart murmur    Dr Tamala Julian is her  cardiologist.   . Hyperlipemia   . Hypertension    Dr. Tamala Julian ~ 2 years ago  . Neck fracture  [facility])    july 2013  . Osteoporosis   . Syncope   . Type 1 diabetes mellitus ([place])    Past Surgical History:  Past Surgical History:  Procedure Laterality Date  . ANKLE FRACTURE SURGERY Left 2001   steel plate and 3 screws   . ANTERIOR CERVICAL CORPECTOMY N/A 07/31/2014   Procedure: Cervical Four to Cervical Six Corpectomy;  Surgeon: Floyce Stakes, MD;  Location: [facility];  Service: Neurosurgery;  Laterality: N/A;  C4 to C6 Corpectomy  . BREAST SURGERY  1988  . CATARACT EXTRACTION W/ INTRAOCULAR LENS  IMPLANT, BILATERAL Bilateral 2011   right and then left  . EYE SURGERY    . FRACTURE SURGERY    . [place]  . I&D EXTREMITY Right 09/27/2018   Procedure: IRRIGATION AND DEBRIDEMENT RIGHT FOOT;  Surgeon: Newt Minion, MD;  Location: [place];  Service: Orthopedics;  Laterality: Right;  . JOINT REPLACEMENT    . PACEMAKER IMPLANT N/A 02/10/2019   Procedure: PACEMAKER IMPLANT;  Surgeon: Evans Lance, MD;  Location: [place] [facility];  Service: Cardiovascular;  Laterality: N/A;  . TONSILLECTOMY AND ADENOIDECTOMY  1948  . TOTAL SHOULDER REPLACEMENT  2010   right shoulder   . TUBAL LIGATION  1979  . TYMPANOSTOMY TUBE PLACEMENT Bilateral     Assessment & Plan Clinical Impression: Patient is a 78 year old right-handed female with history of diabetes mellitus maintained on Tresiba 10 mg  daily, hyperlipidemia , hypertension, mild aortic stenosis, carotid artery disease maintained on aspirin 81 mg daily, anterior cervical corpectomy, recurrent syncopal episodes followed by cardiology services. Per chart review patient and daughter, patient lives alone. One level home with 4 steps to entry. Perform all ADLs and driving. She does use a cane when needed. Family in the area works. Presented 02/07/2019 after syncopal episode while at the dental office. Daughter reports that she struck her head. No seizure activity noted. Blood sugars greater than 200. Cranial CT/MRI showed left  frontal intraparenchymal hematoma with volume of approximately 10 mL. Posterior right parietal scalp hematoma consistent with contrecoup pattern of injury. No acute fracture. No underlying mass lesion. There was a 4 mm left-to-right midline shift. No hydrocephalus. Neurosurgery Dr. Kathyrn Sheriff advise conservative care. Noted blood pressure elevated maintained on nifedipine drip as well as heart rate 100s. Echocardiogram with ejection fraction of 99% normal systolic function. EP follow-up for multiple episodes of intermittent high-grade heart block identified on telemetry followed by cardiology services in the past with troponin negative. 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 Osie Cheeks. 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 Plan currently is to continue to monitor no plan for endoscopy at this time. No further episodes reported hemoglobin remains stable 11.8. Patient currently a full liquid diet nasogastric tube in place for nutritional supportthat was later discontinued. Close monitoring of blood pressure on Lopressor 25 mg twice a day. Therapy evaluations completed with recommendations of physical medicine rehabilitation consult. Patient transferred to CIR on 02/14/2019 .   Patient currently requires max with mobility secondary to decreased cardiorespiratoy endurance, decreased motor planning, decreased initiation, decreased attention, decreased awareness, decreased problem solving, decreased safety awareness, decreased memory and delayed processing and decreased sitting balance, decreased standing balance, decreased postural control and decreased balance strategies.  Prior to hospitalization, patient was independent  with mobility and lived with Alone in a House home.  Home access is 2Stairs to enter.  Patient will benefit from  skilled PT intervention to maximize safe functional mobility, minimize fall risk and decrease caregiver burden for planned discharge home with 24 hour supervision.  Anticipate patient will benefit from follow up [place] at discharge.  PT - End of Session Activity Tolerance: Tolerates < 10 min activity, no significant change in vital signs Endurance Deficit: Yes Endurance Deficit Description: decreased, increased work of breathing w/ all activity PT Assessment Rehab Potential (ACUTE/IP ONLY): Good PT Barriers to Discharge: Other (comments) PT Barriers to Discharge Comments: cognitive impairments PT Patient demonstrates impairments in the following area(s): Balance;Behavior;Endurance;Perception;Safety;Pain PT Transfers Functional Problem(s): Bed Mobility;Bed to Chair;Car;Furniture;Floor PT Locomotion Functional Problem(s): Ambulation;Stairs PT Plan PT Intensity: Minimum of 1-2 x/day ,45 to 90 minutes PT Frequency: 5 out of 7 days PT Duration Estimated Length of Stay: 10-14 days PT Treatment/Interventions: Ambulation/gait training;Cognitive remediation/compensation;Discharge planning;DME/adaptive equipment instruction;Functional mobility training;Pain management;Psychosocial support;Splinting/orthotics;Therapeutic Activities;UE/LE Strength taining/ROM;Visual/perceptual remediation/compensation;Wheelchair propulsion/positioning;UE/LE Coordination activities;Therapeutic Exercise;Stair training;Skin care/wound management;Patient/family education;Neuromuscular re-education;Disease management/prevention;Community reintegration;Balance/vestibular training PT Transfers Anticipated Outcome(s): supervision PT Locomotion Anticipated Outcome(s): supervision household gait w/ LRAD PT Recommendation Follow Up Recommendations: Home health PT Patient destination: Home Equipment Recommended: To be determined Equipment Details: Has RW and SPCalready  Skilled Therapeutic Intervention  Pt in supine and agreeable to  therapy, denies pain at rest. Daughter present throughout session assisting w/ PLOF and home environment information. Pt required increased time for all answers to questions and mobility  2/2 delay in processing, difficulty w/ open-ended questions, memory impairments, and impaired attention to task. Performed R/L rolling w/ min assist and sit<>supine w/ max assist 2/2 R rib cage pain. Pt unable to rate, however grimacing and yelling out in pain w/ transitional movements. Daughter stated that pt has had similar pain w/ coughing. Made RN aware and RN present to provide pain medication. Pt agreeable to attempt OOB mobility despite pain. Min assist sit<>stand and stand pivot w/ RW and extra time 2/2 pain. Pt self-propelled w/c 100' to therapy gym w/ BUEs/LEs and ambulated 15' w/ RW, as detailed below w/ min assist. Pt w/ increasing pain and fatigue at this point and unable to attend to task as a result. Returned to room via w/c. Pt agreeable to stay in w/c to eat lunch and to limit further exacerbation of pain w/ movement. Instructed pt and daughter in results of PT evaluation as detailed below, PT POC, rehab potential, rehab goals, and discharge recommendations. Additionally discussed CIR's policies regarding fall safety and use of chair alarm and/or quick release belt. Pt and daughter verbalized understanding and in agreement. Ended session in w/c and in care of daughter, all needs in reach.  PT Evaluation Precautions/Restrictions Precautions Precautions: Fall;ICD/Pacemaker Restrictions Weight Bearing Restrictions: No LUE Weight Bearing: Non weight bearing General PT Amount of Missed Time (min): 15 Minutes PT Missed Treatment Reason: Patient fatigue;Pain Vital SignsTherapy Vitals Temp: 97.9 F (36.6 C) Pulse Rate: 78 Resp: 19 BP: (!) 116/40 Patient Position (if appropriate): Sitting Oxygen Therapy SpO2: 100 % O2 Device: Room Air Pain Pain Assessment Pain Scale: 0-10 Pain Score: 4  Faces Pain  Scale: Hurts little more Pain Type: Acute pain Pain Location: Rib cage Pain Orientation: Right Pain Descriptors / Indicators: Aching Pain Frequency: Intermittent Pain Onset: With Activity Patients Stated Pain Goal: 0 Pain Intervention(s): Medication (See eMAR) Home Living/Prior Functioning Home Living Available Help at Discharge: Family;Available 24 hours/day Type of Home: House Home Access: Stairs to enter [employer] of Steps: 2 Home Layout: One level Bathroom Shower/Tub: Chiropodist: Handicapped height Additional Comments: daughter is flight attendant with flexible schedule, is working to try and take leave of absence for next month, son and daughter in law local in [place] and able to help as well  Lives With: Alone Prior Function Level of Independence: Independent with homemaking with ambulation;Independent with gait;Independent with basic ADLs;Independent with transfers  Able to Take Stairs?: Yes Driving: Yes Vocation: Retired Comments: Performs all ADLs and IADLs including driving. Uses a cane infrequently Vision/Perception  Perception Perception: Within Functional Limits Praxis Praxis: Impaired Praxis Impairment Details: Initiation;Motor planning  Cognition Overall Cognitive Status: Impaired/Different from baseline Arousal/Alertness: Awake/alert Orientation Level: Oriented X4 Attention: Sustained Sustained Attention: Impaired Sustained Attention Impairment: Functional basic;Verbal basic Memory: Impaired Memory Impairment: Retrieval deficit;Decreased recall of new information Awareness: Impaired Awareness Impairment: Intellectual impairment Problem Solving: Impaired Problem Solving Impairment: Functional basic;Verbal basic Safety/Judgment: Impaired Sensation Sensation Light Touch: Appears Intact(BLEs) Coordination Gross Motor Movements are Fluid and Coordinated: No Fine Motor Movements are Fluid and Coordinated: Yes Coordination  and Movement Description: Gross motor movements impaired 2/2 slow processing speed and global weakness preventing smooth and coordinated movements Motor  Motor Motor: Within Functional Limits Motor - Skilled Clinical Observations: generalized weakness  Mobility Bed Mobility Bed Mobility: Rolling Right;Rolling Left;Sit to Supine;Supine to Sit Rolling Right: Minimal Assistance - Patient > 75% Rolling Left: Minimal Assistance - Patient > 75% Supine to Sit: Maximal Assistance - Patient - Patient 25-49% Sit to  Supine: Maximal Assistance - Patient 25-49% Transfers Transfers: Sit to Stand;Stand to Sit;Stand Pivot Transfers Sit to Stand: Minimal Assistance - Patient > 75% Stand to Sit: Minimal Assistance - Patient > 75% Stand Pivot Transfers: Minimal Assistance - Patient > 75% Stand Pivot Transfer Details: Manual facilitation for placement;Manual facilitation for weight shifting;Manual facilitation for weight bearing;Verbal cues for technique;Verbal cues for safe use of DME/AE;Tactile cues for initiation;Verbal cues for precautions/safety Transfer (Assistive device): Rolling walker Locomotion  Gait Ambulation: Yes Gait Assistance: Minimal Assistance - Patient > 75% Gait Distance (Feet): 15 Feet Assistive device: Rolling walker Gait Assistance Details: Manual facilitation for weight shifting;Verbal cues for gait pattern;Manual facilitation for placement;Verbal cues for safe use of DME/AE;Tactile cues for initiation;Tactile cues for posture;Verbal cues for precautions/safety;Verbal cues for technique Gait Gait: Yes Gait Pattern: Impaired Gait Pattern: Decreased stride length;Shuffle;Poor foot clearance - right;Poor foot clearance - left Gait velocity: decreased, guarding 2/2 rib pain Stairs / Additional Locomotion Stairs: No Wheelchair Mobility Wheelchair Mobility: Yes Wheelchair Assistance: Chartered loss adjuster: Both upper extremities;Both lower  extermities Wheelchair Parts Management: Needs assistance Distance: 100'  Trunk/Postural Assessment  Cervical Assessment Cervical Assessment: Exceptions to WFL(forward head) Thoracic Assessment Thoracic Assessment: Exceptions to WFL(rounded shoulder posture) Lumbar Assessment Lumbar Assessment: Exceptions to WFL(posterior pelvic tilt) Postural Control Postural Control: Deficits on evaluation(posterior lean bias)  Balance Balance Balance Assessed: Yes Static Sitting Balance Static Sitting - Balance Support: Feet supported;No upper extremity supported Static Sitting - Level of Assistance: 5: Stand by assistance Dynamic Sitting Balance Dynamic Sitting - Balance Support: Feet unsupported;Bilateral upper extremity supported Dynamic Sitting - Level of Assistance: 3: Mod assist Static Standing Balance Static Standing - Balance Support: Bilateral upper extremity supported;During functional activity Static Standing - Level of Assistance: 4: Min assist Extremity Assessment      RLE Assessment RLE Assessment: Within Functional Limits LLE Assessment LLE Assessment: Within Functional Limits    Refer to Care Plan for Long Term Goals  Recommendations for other services: None   Discharge Criteria: Patient will be discharged from PT if patient refuses treatment 3 consecutive times without medical reason, if treatment goals not met, if there is a change in medical status, if patient makes no progress towards goals or if patient is discharged from hospital.  The above assessment, treatment plan, treatment alternatives and goals were discussed and mutually agreed upon: by patient and by family  Danielle Clarke 02/15/2019, 4:06 PM

## 2019-02-14 NOTE — Progress Notes (Signed)
Physical Therapy Treatment Patient Details Name: Danielle Clarke MRN: [id number] DOB: 01/25/1941 Today's Date: 02/14/2019    History of Present Illness 78 yo with syncope at dentist office with Left frontal Intraparenchymal hematoma with bil R>L temporal contusions. s/p pacer placement 3/16. PMHx: HTN, DM, glaucoma, HLD, RT TSA    PT Comments    Pt in bed upon arrival and agreed to participate with therapy. Pt progressing well towards PT goals.  Pt progressed in gait training today improving gait distance and tolerance. Pt had no episodes of LOB during ambulation.  Pt educated on LUE precautions due to recent pacemaker with no recall of precautions. Orthostatics assessed due to recent complaints of dizziness; Supine:129/40,  Sitting EOB: 113/51, Standing: 112/46. Pt did not complain of dizziness today. Pt would continue to benefit from skilled therapy to improve functional mobility and decrease caregiver burden.   Follow Up Recommendations  CIR;Supervision/Assistance - 24 hour     Equipment Recommendations  Rolling walker with 5" wheels    Recommendations for Other Services       Precautions / Restrictions Precautions Precautions: Fall;ICD/Pacemaker    Mobility  Bed Mobility Overal bed mobility: Needs Assistance Bed Mobility: Rolling;Sidelying to Sit Rolling: Min assist Sidelying to sit: Mod assist       General bed mobility comments: cues for sequence with assist to fully rotate hips and elevate trunk from surface  Transfers Overall transfer level: Needs assistance Equipment used: Rolling walker (2 wheeled) Transfers: Sit to/from Stand Sit to Stand: Mod assist         General transfer comment: physical assist to rise with max cues for hand placement, increased time  Ambulation/Gait Ambulation/Gait assistance: Min assist;+2 safety/equipment Gait Distance (Feet): 150 Feet Assistive device: Rolling walker (2 wheeled) Gait Pattern/deviations: Step-through  pattern;Decreased stride length;Narrow base of support;Trunk flexed Gait velocity: decreased   General Gait Details: pt ambulated with a much improved gait pattern today with no complaints of dizziness, ambulated one lap around unit, chair follow for safety    Stairs             Wheelchair Mobility    Modified Rankin (Stroke Patients Only) Modified Rankin (Stroke Patients Only) Pre-Morbid Rankin Score: No symptoms Modified Rankin: Moderately severe disability     Balance Overall balance assessment: Needs assistance Sitting-balance support: Bilateral upper extremity supported;Feet supported Sitting balance-Leahy Scale: Fair     Standing balance support: Bilateral upper extremity supported;During functional activity Standing balance-Leahy Scale: Poor Standing balance comment: pt requires bil UE support                             Cognition Arousal/Alertness: Awake/alert Behavior During Therapy: Flat affect Overall Cognitive Status: Impaired/Different from baseline Area of Impairment: Orientation;Problem solving;Attention;Memory;Following commands;Safety/judgement;Awareness                 Orientation Level: Disoriented to;Time Current Attention Level: Focused Memory: Decreased recall of precautions;Decreased short-term memory Following Commands: Follows one step commands inconsistently;Follows one step commands with increased time Safety/Judgement: Decreased awareness of deficits;Decreased awareness of safety Awareness: Intellectual Problem Solving: Slow processing;Decreased initiation;Difficulty sequencing;Requires verbal cues General Comments: pt unable to recall left UE precautions even after just being educated on them      Exercises      General Comments        Pertinent Vitals/Pain Pain Score: 4  Pain Location: back Pain Descriptors / Indicators: Aching;Discomfort Pain Intervention(s): Limited activity within patient's  tolerance;Monitored during  session;Repositioned    Home Living                      Prior Function            PT Goals (current goals can now be found in the care plan section) Progress towards PT goals: Progressing toward goals    Frequency    Min 4X/week      PT Plan Current plan remains appropriate    Co-evaluation              AM-PAC PT "6 Clicks" Mobility   Outcome Measure  Help needed turning from your back to your side while in a flat bed without using bedrails?: A Little Help needed moving from lying on your back to sitting on the side of a flat bed without using bedrails?: A Little Help needed moving to and from a bed to a chair (including a wheelchair)?: A Little Help needed standing up from a chair using your arms (e.g., wheelchair or bedside chair)?: A Little Help needed to walk in hospital room?: A Lot Help needed climbing 3-5 steps with a railing? : A Lot 6 Click Score: 16    End of Session Equipment Utilized During Treatment: Gait belt Activity Tolerance: Patient tolerated treatment well Patient left: in chair;with call bell/phone within reach;with chair alarm set Nurse Communication: Mobility status PT Visit Diagnosis: Unsteadiness on feet (R26.81);Difficulty in walking, not elsewhere classified (R26.2)     Time: 7903-8333 PT Time Calculation (min) (ACUTE ONLY): 23 min  Charges:  $Gait Training: 8-22 mins $Therapeutic Activity: 8-22 mins                     Aymara Sassi, [place] [phone]    Danielle Clarke 02/14/2019, 12:24 PM

## 2019-02-14 NOTE — Progress Notes (Signed)
Occupational Therapy Treatment Patient Details Name: Danielle Clarke MRN: [id number] DOB: 08/25/1941 Today's Date: 02/14/2019    History of present illness 78 yo with syncope at dentist office with Left frontal Intraparenchymal hematoma with bil R>L temporal contusions. s/p pacer placement 3/16. PMHx: HTN, DM, glaucoma, HLD, RT TSA   OT comments  Pt. Seen for skilled OT.  Pt. Able to complete seated LB dressing task with mod A.  Noted some perseveration of thoughts during session.  Required multiple attempts to redirect and reassure patient.  Note pending d/c to CIR.  Pt. Remains excellent candidate and will greatly benefit from continued therapies.    Follow Up Recommendations  CIR;Supervision/Assistance - 24 hour    Equipment Recommendations  Other (comment)    Recommendations for Other Services Rehab consult;PT consult;Speech consult    Precautions / Restrictions Precautions Precautions: Fall;ICD/Pacemaker Restrictions LUE Weight Bearing: Non weight bearing       Mobility Bed Mobility Overal bed mobility: Needs Assistance Bed Mobility: Rolling;Sidelying to Sit Rolling: Min assist Sidelying to sit: Mod assist       General bed mobility comments: cues for sequence with assist to fully rotate hips and elevate trunk from surface  Transfers Overall transfer level: Needs assistance Equipment used: Rolling walker (2 wheeled) Transfers: Sit to/from Stand Sit to Stand: Mod assist         General transfer comment: physical assist to rise with max cues for hand placement, increased time    Balance Overall balance assessment: Needs assistance Sitting-balance support: Bilateral upper extremity supported;Feet supported Sitting balance-Leahy Scale: Fair     Standing balance support: Bilateral upper extremity supported;During functional activity Standing balance-Leahy Scale: Poor Standing balance comment: pt requires bil UE support                             ADL either performed or assessed with clinical judgement   ADL Overall ADL's : Needs assistance/impaired                 Upper Body Dressing : Moderate assistance;Sitting     Lower Body Dressing Details (indicate cue type and reason): able to cross L leg over knee and don sock without cues or physical assistance. when it was time to don R sock pt. fumbling with how to manage that LE and was trying to pull it up towards her.  when asked how she usually got dressed ie: put on socks she replied, "you know, i d ont remember how i  used to do this.  assistance provided to don R sock.                 General ADL Comments: pt. with notable fatigue, and cognitive impairments      Vision       Perception     Praxis      Cognition Arousal/Alertness: Awake/alert Behavior During Therapy: Flat affect Overall Cognitive Status: Impaired/Different from baseline Area of Impairment: Orientation;Problem solving;Attention;Memory;Following commands;Safety/judgement;Awareness                 Orientation Level: Disoriented to;Time Current Attention Level: Focused Memory: Decreased recall of precautions;Decreased short-term memory Following Commands: Follows one step commands inconsistently;Follows one step commands with increased time Safety/Judgement: Decreased awareness of deficits;Decreased awareness of safety Awareness: Intellectual Problem Solving: Slow processing;Decreased initiation;Difficulty sequencing;Requires verbal cues General Comments: pt. with perseveration regarding the furniture in the room.  the transfer to CIR had sparked the concern.  reviewed with pt. that the furniture would stay in the room and there would be different furniture in her new room.  she nodded yes but then cont. to ask about various other pieces of furniture in the room. reviewed a second time with pt. in greater detail of each item that would stay in the room assured her she did not need to  worry about how to manage her transfer.  she looked at me and said "but i do want my tea (referring to un opened container of tea).  told her she was welcome to keep her tea.  reviewed the items she could and would be taking with her. explained what she could expect at the new unit and for her to focus on rest and therapy and we would all take care of the rest.          Exercises     Shoulder Instructions       General Comments      Pertinent Vitals/ Pain       Pain Assessment: No/denies pain Pain Score: 4  Pain Location: back Pain Descriptors / Indicators: Aching;Discomfort Pain Intervention(s): Limited activity within patient's tolerance;Monitored during session;Repositioned  Home Living                                          Prior Functioning/Environment              Frequency  Min 3X/week        Progress Toward Goals  OT Goals(current goals can now be found in the care plan section)  Progress towards OT goals: Progressing toward goals     Plan Discharge plan remains appropriate    Co-evaluation                 AM-PAC OT "6 Clicks" Daily Activity     Outcome Measure   Help from another person eating meals?: Total Help from another person taking care of personal grooming?: A Lot Help from another person toileting, which includes using toliet, bedpan, or urinal?: A Lot Help from another person bathing (including washing, rinsing, drying)?: A Lot Help from another person to put on and taking off regular upper body clothing?: A Lot Help from another person to put on and taking off regular lower body clothing?: A Lot 6 Click Score: 11    End of Session    OT Visit Diagnosis: Unsteadiness on feet (R26.81);Other abnormalities of gait and mobility (R26.89);Muscle weakness (generalized) (M62.81);Pain   Activity Tolerance Patient tolerated treatment well;Patient limited by fatigue   Patient Left in chair;with call bell/phone within  reach   Nurse Communication          Time: 9702-6378 OT Time Calculation (min): 11 min  Charges: OT General Charges $OT Visit: 1 Visit OT Treatments $Self Care/Home Management : 8-22 mins  Robet Leu, COTA/L 02/14/2019, 2:02 PM

## 2019-02-14 NOTE — Progress Notes (Signed)
Inpatient Rehabilitation  Patient information reviewed and entered into eRehab system by Robi Dewolfe M. Lavonn Maxcy, M.A., CCC/SLP, PPS Coordinator.  Information including medical coding, functional ability and quality indicators will be reviewed and updated through discharge.    Per nursing patient was given "Data Collection Information Summary" for Patients in Inpatient Rehabilitation Facilities with attached "Privacy Act Statement-Health Care Records" upon admission.   

## 2019-02-14 NOTE — IPOC Note (Addendum)
Overall Plan of Care [facility]) Patient Details Name: Danielle Clarke MRN: [id number] DOB: 07/05/1941  Admitting Diagnosis: Traumatic IPH  Hospital Problems: Active Problems:   Traumatic cerebral intraparenchymal hematoma (HCC)   Multiple closed fractures of ribs of right side   Pleural effusion on right   Hypoalbuminemia due to protein-calorie malnutrition [facility])   Dysphagia     Functional Problem List: Nursing Bladder, Pain, Endurance, Medication Management, Skin Integrity, Safety  PT Balance, Behavior, Endurance, Perception, Safety, Pain  OT Balance, Cognition, Endurance, Motor, Pain  SLP Cognition, Nutrition  TR         Basic ADL's: OT Bathing, Dressing, Toileting, Grooming, Eating     Advanced  ADL's: OT       Transfers: PT Bed Mobility, Bed to Chair, Car, Furniture, Floor  OT Toilet, Research scientist (life sciences): PT Ambulation, Stairs     Additional Impairments: OT None  SLP Swallowing, Social Cognition   Problem Solving, Memory, Attention, Awareness  TR      Anticipated Outcomes Item Anticipated Outcome  Self Feeding set up  Swallowing  mod I    Basic self-care  Supervision  Toileting  Supervision   Bathroom Transfers Supervision  Bowel/Bladder  manage bladder with min assist  Transfers  supervision  Locomotion  supervision household gait w/ LRAD  Communication     Cognition  Supervision   Pain  pain at or below level 4  Safety/Judgment  manage safety with cues/reminders   Therapy Plan: PT Intensity: Minimum of 1-2 x/day ,45 to 90 minutes PT Frequency: 5 out of 7 days PT Duration Estimated Length of Stay: 10-14 days OT Intensity: Minimum of 1-2 x/day, 45 to 90 minutes OT Frequency: 5 out of 7 days OT Duration/Estimated Length of Stay: 12-14 days SLP Intensity: Minumum of 1-2 x/day, 30 to 90 minutes SLP Frequency: 3 to 5 out of 7 days SLP Duration/Estimated Length of Stay: 10-14 days     Team Interventions: Nursing Interventions  Patient/Family Education, Pain Management, Skin Care/Wound Management, Bladder Management, Disease Management/Prevention, Medication Management, Discharge Planning  PT interventions Ambulation/gait training, Cognitive remediation/compensation, Discharge planning, DME/adaptive equipment instruction, Functional mobility training, Pain management, Psychosocial support, Splinting/orthotics, Therapeutic Activities, UE/LE Strength taining/ROM, Visual/perceptual remediation/compensation, Wheelchair propulsion/positioning, UE/LE Coordination activities, Therapeutic Exercise, Stair training, Skin care/wound management, Patient/family education, Neuromuscular re-education, Disease management/prevention, Firefighter, Warden/ranger  OT Interventions Warden/ranger, Cognitive remediation/compensation, DME/adaptive equipment instruction, Discharge planning, Functional mobility training, Neuromuscular re-education, Patient/family education, Self Care/advanced ADL retraining, UE/LE Strength taining/ROM, Therapeutic Exercise, UE/LE Coordination activities, Therapeutic Activities, Pain management  SLP Interventions Cognitive remediation/compensation, Cueing hierarchy, Dysphagia/aspiration precaution training, Internal/external aids, Environmental controls, Functional tasks, Patient/family education  TR Interventions    SW/CM Interventions Discharge Planning, Psychosocial Support, Patient/Family Education   Barriers to Discharge MD  Medical stability  Nursing      PT Other (comments) cognitive impairments  OT      SLP      SW       Team Discharge Planning: Destination: PT-Home ,OT- Home , SLP-Home Projected Follow-up: PT-Home health PT, OT-  Home health OT, SLP-Home Health SLP, Outpatient SLP, 24 hour supervision/assistance Projected Equipment Needs: PT-To be determined, OT- 3 in 1 bedside comode, SLP-None recommended by SLP Equipment Details: PT-Has RW and SPCalready,  OT-daughter reports that she has a shower seat and a RW Patient/family involved in discharge planning: PT- Patient, Family Adult nurse,  OT-Patient, Family member/caregiver, SLP-Patient  MD ELOS: 9-12 days. Medical Rehab Prognosis:  Good Assessment: 78 year old right-handed  female with history of diabetes mellitus maintained on Tresiba 10 mg daily, hyperlipidemia , hypertension, mild aortic stenosis, carotid artery disease maintained on aspirin 81 mg daily, anterior cervical corpectomy, recurrent syncopal episodes followed by cardiology services. Presented 02/07/2019 after syncopal episode while at the dental office. Daughter reports that she struck her head. No seizure activity noted. Blood sugars greater than 200. Cranial CT/MRI showed left frontal intraparenchymal hematoma with volume of approximately 10 mL. Posterior right parietal scalp hematoma consistent with contrecoup pattern of injury. No acute fracture. No underlying mass lesion. There was a 4 mm left-to-right midline shift. No hydrocephalus. Neurosurgery Dr. Conchita Paris advise conservative care. Noted blood pressure elevated maintained on nifedipine drip as well as heart rate 100s. Echocardiogram with ejection fraction of 65% normal systolic function. EP follow-up for multiple episodes of intermittent high-grade heart block identified on telemetry followed by cardiology services in the past with troponin negative. 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 Gilman Schmidt. 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 Plan currently is to continue to monitor no plan for endoscopy at this time. No further episodes reported hemoglobin remains stable. Patient currently a full liquid diet nasogastric tube in place for nutritional supportthat was later discontinued. Close monitoring of  blood pressure on Lopressor 25 mg twice a day. Patient with resulting functional deficits with mobility, endurance, self-care.  Will set goals for Supervision with PT/OT/SLP.  See Team Conference Notes for weekly updates to the plan of care

## 2019-02-15 ENCOUNTER — Inpatient Hospital Stay (HOSPITAL_COMMUNITY): Payer: Medicare Other | Admitting: Speech Pathology

## 2019-02-15 ENCOUNTER — Inpatient Hospital Stay (HOSPITAL_COMMUNITY): Payer: Medicare Other | Admitting: Occupational Therapy

## 2019-02-15 ENCOUNTER — Inpatient Hospital Stay (HOSPITAL_COMMUNITY): Payer: Medicare Other | Admitting: Physical Therapy

## 2019-02-15 DIAGNOSIS — E1149 Type 2 diabetes mellitus with other diabetic neurological complication: Secondary | ICD-10-CM

## 2019-02-15 DIAGNOSIS — R55 Syncope and collapse: Secondary | ICD-10-CM

## 2019-02-15 DIAGNOSIS — S06342S Traumatic hemorrhage of right cerebrum with loss of consciousness of 31 minutes to 59 minutes, sequela: Secondary | ICD-10-CM

## 2019-02-15 LAB — GLUCOSE, CAPILLARY
Glucose-Capillary: 103 mg/dL — ABNORMAL HIGH (ref 70–99)
Glucose-Capillary: 174 mg/dL — ABNORMAL HIGH (ref 70–99)
Glucose-Capillary: 194 mg/dL — ABNORMAL HIGH (ref 70–99)
Glucose-Capillary: 296 mg/dL — ABNORMAL HIGH (ref 70–99)
Glucose-Capillary: 303 mg/dL — ABNORMAL HIGH (ref 70–99)
Glucose-Capillary: 96 mg/dL (ref 70–99)

## 2019-02-15 NOTE — Evaluation (Signed)
Occupational Therapy Assessment and Plan  Patient Details  Name: Danielle Clarke MRN: [id number] Date of Birth: 1941/08/16  OT Diagnosis: acute pain, cognitive deficits and muscle weakness (generalized) Rehab Potential: Rehab Potential (ACUTE ONLY): Good ELOS: 12-14 days   Today's Date: 02/15/2019 OT Individual Time: 1000-1100 OT Individual Time Calculation (min): 60 min     Problem List:  Patient Active Problem List   Diagnosis Date Noted  . Traumatic cerebral intraparenchymal hematoma ([place]) 02/14/2019  . Pacemaker   . Labile blood glucose   . Diabetes mellitus type 2 in nonobese (HCC)   . Dyslipidemia   . Essential hypertension   . Hypernatremia   . Anemia of chronic disease   . Hypertensive urgency 02/07/2019  . Intracerebral hemorrhage ([place]) 02/07/2019  . Intracranial bleeding ([place]) 02/06/2019  . Elevated troponin   . Abnormal EKG   . Bronchitis 08/05/2017  . Bilateral carotid artery stenosis   . Aortic stenosis 08/25/2015  . Cervical stenosis of spinal canal 07/31/2014  . CKD (chronic kidney disease) stage 2, GFR 60-89 ml/min 12/30/2013  . Essential hypertension, benign 12/30/2013  . Hyperlipidemia 12/30/2013  . Toe infection 12/30/2013  . Anemia 12/30/2013  . Acute renal failure ([place]) 12/29/2013  . Syncope 01/02/2013  . Diabetes mellitus type 1 ([place]) 01/02/2013  . Bilateral carotid artery disease ([place]) 01/02/2013  . GOITER, MULTINODULAR 02/11/2008    Past Medical History:  Past Medical History:  Diagnosis Date  . Aortic stenosis, mild   . Arthritis   . Asthmatic bronchitis    with colds per patient  . Carotid artery disease (HCC)    40-59% bilateral ICA stenosis  . Cataract   . Cutaneous abscess of right foot   . Glaucoma   . Heart murmur    Dr Danielle Clarke is her  cardiologist.   . Hyperlipemia   . Hypertension    Dr. Tamala Clarke ~ 2 years ago  . Neck fracture [facility])    july 2013  . Osteoporosis   . Syncope   . Type 1 diabetes mellitus ([place])    Past  Surgical History:  Past Surgical History:  Procedure Laterality Date  . ANKLE FRACTURE SURGERY Left 2001   steel plate and 3 screws   . ANTERIOR CERVICAL CORPECTOMY N/A 07/31/2014   Procedure: Cervical Four to Cervical Six Corpectomy;  Surgeon: Danielle Stakes, MD;  Location: [facility];  Service: Neurosurgery;  Laterality: N/A;  C4 to C6 Corpectomy  . BREAST SURGERY  1988  . CATARACT EXTRACTION W/ INTRAOCULAR LENS  IMPLANT, BILATERAL Bilateral 2011   right and then left  . EYE SURGERY    . FRACTURE SURGERY    . [place]  . I&D EXTREMITY Right 09/27/2018   Procedure: IRRIGATION AND DEBRIDEMENT RIGHT FOOT;  Surgeon: Danielle Minion, MD;  Location: [facility];  Service: Orthopedics;  Laterality: Right;  . JOINT REPLACEMENT    . PACEMAKER IMPLANT N/A 02/10/2019   Procedure: PACEMAKER IMPLANT;  Surgeon: Danielle Lance, MD;  Location: [facility];  Service: Cardiovascular;  Laterality: N/A;  . TONSILLECTOMY AND ADENOIDECTOMY  1948  . TOTAL SHOULDER REPLACEMENT  2010   right shoulder   . TUBAL LIGATION  1979  . TYMPANOSTOMY TUBE PLACEMENT Bilateral     Assessment & Plan Clinical Impression:  Danielle Clarke is a 78 year old right-handed female with history of diabetes mellitus maintained on Tresiba 10 mg daily, hyperlipidemia , hypertension, mild aortic stenosis, carotid artery disease maintained on aspirin  81 mg daily, anterior cervical corpectomy, recurrent syncopal episodes followed by cardiology services. Per chart review patient and daughter, patient lives alone. One level home with 4 steps to entry. Perform all ADLs and driving. She does use a cane when needed. Family in the area works. Presented 02/07/2019 after syncopal episode while at the dental office. Daughter reports that she struck her head. No seizure activity noted. Blood sugars greater than 200. Cranial CT/MRI showed left frontal intraparenchymal hematoma with volume of approximately 10 mL. Posterior right  parietal scalp hematoma consistent with contrecoup pattern of injury. No acute fracture. No underlying mass lesion. There was a 4 mm left-to-right midline shift. No hydrocephalus. Neurosurgery Dr. Kathyrn Clarke advise conservative care. Noted blood pressure elevated maintained on nifedipine drip as well as heart rate 100s. Echocardiogram with ejection fraction of 12% normal systolic function. EP follow-up for multiple episodes of intermittent high-grade heart block identified on telemetry followed by cardiology services in the past with troponin negative. 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 Osie Clarke. 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 Plan currently is to continue to monitor no plan for endoscopy at this time. No further episodes reported hemoglobin remains stable 11.8. Patient currently a full liquid diet nasogastric tube in place for nutritional support that was later discontinued. Close monitoring of blood pressure on Lopressor 25 mg twice a day. Therapy evaluations completed with recommendations of physical medicine rehabilitation consult. Patient was admitted for a comprehensive rehabilitation program.   .  Patient transferred to CIR on 02/14/2019 .    Patient currently requires max with basic self-care skills secondary to muscle weakness, decreased cardiorespiratoy endurance, decreased awareness, decreased problem solving, decreased safety awareness, decreased memory and delayed processing and decreased standing balance, decreased postural control and decreased balance strategies.  Prior to hospitalization, patient was fully I and lived alone.  Patient will benefit from skilled intervention to increase independence with basic self-care skills prior to discharge home with care partner.  Anticipate patient will require 24  hour supervision and follow up home health.  OT - End of Session Activity Tolerance: Tolerates 10 - 20 min activity with multiple rests Endurance Deficit: Yes Endurance Deficit Description: decreased, increased work of breathing w/ all activity OT Assessment Rehab Potential (ACUTE ONLY): Good OT Patient demonstrates impairments in the following area(s): Balance;Cognition;Endurance;Motor;Pain OT Basic ADL's Functional Problem(s): Bathing;Dressing;Toileting;Grooming;Eating OT Transfers Functional Problem(s): Toilet;Tub/Shower OT Additional Impairment(s): None OT Plan OT Intensity: Minimum of 1-2 x/day, 45 to 90 minutes OT Frequency: 5 out of 7 days OT Duration/Estimated Length of Stay: 12-14 days OT Treatment/Interventions: Balance/vestibular training;Cognitive remediation/compensation;DME/adaptive equipment instruction;Discharge planning;Functional mobility training;Neuromuscular re-education;Patient/family education;Self Care/advanced ADL retraining;UE/LE Strength taining/ROM;Therapeutic Exercise;UE/LE Coordination activities;Therapeutic Activities;Pain management OT Self Feeding Anticipated Outcome(s): set up OT Basic Self-Care Anticipated Outcome(s): Supervision OT Toileting Anticipated Outcome(s): Supervision OT Bathroom Transfers Anticipated Outcome(s): Supervision OT Recommendation Patient destination: Home Follow Up Recommendations: Home health OT Equipment Recommended: 3 in 1 bedside comode Equipment Details: daughter reports that she has a shower seat and a RW   Skilled Therapeutic Intervention Pt seen for initial evaluation and ADL training. [name] dtr also present. Discussed role of OT, POC, estimated LOS.   Pt had a difficult time with comprehension and follow through due to slow processing and awareness.  She was very limited by pain in her ribs.  She needed a great deal of A with dressing due to  difficulty reaching with B arms and pain with reaching toward feet.  As pt's pain  and her cognition improves, pt should be able to reach a S level of care.  Pt resting in bed with alarm on and all needs met.   OT Evaluation Precautions/Restrictions  Precautions Precautions: Fall;ICD/Pacemaker Restrictions LUE Weight Bearing: Non weight bearing  Vital Signs Therapy Vitals Temp: 99.2 F (37.3 C) Temp Source: Oral Pulse Rate: 81 Resp: 14 BP: (!) 130/42 Patient Position (if appropriate): Lying Oxygen Therapy SpO2: 97 % O2 Device: Room Air Pain:  C/o significant R rib pain ONLY with movement  Home Living/Prior Functioning Home Living Available Help at Discharge: Family, Available 24 hours/day Type of Home: House Home Access: Stairs to enter Technical brewer of Steps: 2 Home Layout: One level Bathroom Shower/Tub: Chiropodist: Handicapped height Additional Comments: daughter is flight attendant with flexible schedule, is working to try and take leave of absence for next month, son and daughter in Corporate investment banker in [place] and able to help as well  Lives With: Alone Prior Function Level of Independence: Independent with homemaking with ambulation, Independent with gait, Independent with basic ADLs, Independent with transfers  Able to Take Stairs?: Yes Driving: Yes Vocation: Retired Comments: Performs all ADLs and IADLs including driving. Uses a cane infrequently ADL  max A overall with dressing, mod A bathing and toileting Vision Baseline Vision/History: Wears glasses Wears Glasses: At all times;Distance only Patient Visual Report: No change from baseline Vision Assessment?: No apparent visual deficits;Yes Eye Alignment: Within Functional Limits Ocular Range of Motion: Within Functional Limits Alignment/Gaze Preference: Within Defined Limits Tracking/Visual Pursuits: Able to track stimulus in all quads without difficulty Visual Fields: No apparent deficits Perception  Perception: Within Functional Limits Praxis Praxis:  Impaired Praxis Impairment Details: Initiation Cognition Overall Cognitive Status: Impaired/Different from baseline Arousal/Alertness: Awake/alert Orientation Level: Person;Place(could not state her diagnosis) Year: 2020(with cue, initially stated 2010) Month: March Day of Week: Correct Memory: Impaired Memory Impairment: Retrieval deficit;Decreased recall of new information Immediate Memory Recall: Sock;Blue;Bed Memory Recall: Sock;Blue;Bed Memory Recall Sock: With Cue Memory Recall Blue: With Cue Memory Recall Bed: (unable to recall) Attention: Sustained Sustained Attention: Impaired Sustained Attention Impairment: Functional basic;Verbal basic Awareness: Impaired Awareness Impairment: Intellectual impairment Problem Solving: Impaired Problem Solving Impairment: Functional basic;Verbal basic Safety/Judgment: Impaired Sensation Sensation Light Touch: Appears Intact Hot/Cold: Appears Intact Proprioception: Appears Intact Stereognosis: Appears Intact Coordination Gross Motor Movements are Fluid and Coordinated: No Fine Motor Movements are Fluid and Coordinated: Yes Coordination and Movement Description: Gross motor movements impaired 2/2 slow processing speed and global weakness preventing smooth and coordinated movements Motor  Motor Motor: Within Functional Limits Motor - Skilled Clinical Observations: generalized weakness Mobility  Bed Mobility Bed Mobility: Rolling Right;Rolling Left;Sit to Supine;Supine to Sit Rolling Right: Minimal Assistance - Patient > 75% Rolling Left: Minimal Assistance - Patient > 75% Supine to Sit: Maximal Assistance - Patient - Patient 25-49% Sit to Supine: Maximal Assistance - Patient 25-49% Transfers Sit to Stand: Minimal Assistance - Patient > 75% Stand to Sit: Minimal Assistance - Patient > 75%  Trunk/Postural Assessment  Cervical Assessment Cervical Assessment: Exceptions to WFL(forward head) Thoracic Assessment Thoracic  Assessment: Exceptions to WFL(rounded shoulder posture) Lumbar Assessment Lumbar Assessment: Exceptions to WFL(posterior pelvic tilt) Postural Control Postural Control: Deficits on evaluation(posterior lean bias)  Balance Balance Balance Assessed: Yes Static Sitting Balance Static Sitting - Balance Support: Feet supported;No upper extremity supported Static Sitting - Level of Assistance: 5: Stand by assistance Dynamic Sitting  Balance Dynamic Sitting - Balance Support: Feet unsupported;Bilateral upper extremity supported Dynamic Sitting - Level of Assistance: 3: Mod assist Static Standing Balance Static Standing - Balance Support: Bilateral upper extremity supported;During functional activity Static Standing - Level of Assistance: 4: Min assist Extremity/Trunk Assessment RUE Assessment Active Range of Motion (AROM) Comments: limited shoulder AROM due to Pain in R ribs when lifting arm  General Strength Comments: elbow and grasp 4/5 LUE Assessment Active Range of Motion (AROM) Comments: limited AROM in shoulder due to new pacemaker and AROM precautions General Strength Comments: elbow and grasp 4/5     Refer to Care Plan for Long Term Goals  Recommendations for other services: None    Discharge Criteria: Patient will be discharged from OT if patient refuses treatment 3 consecutive times without medical reason, if treatment goals not met, if there is a change in medical status, if patient makes no progress towards goals or if patient is discharged from hospital.  The above assessment, treatment plan, treatment alternatives and goals were discussed and mutually agreed upon: by patient and by family  [place] 02/15/2019, 5:14 PM

## 2019-02-15 NOTE — Progress Notes (Addendum)
Danielle Clarke is a 78 y.o. female 1941/01/28 [id number]  Subjective: No new complaints. No new problems. Slept well. Feeling OK.  Objective: Vital signs in last 24 hours: Temp:  [97.6 F (36.4 C)-99.1 F (37.3 C)] 99.1 F (37.3 C) (03/21 0424) Pulse Rate:  [81-88] 85 (03/21 0424) Resp:  [14-18] 16 (03/21 0424) BP: (123-163)/(43-58) 163/58 (03/21 0424) SpO2:  [95 %-100 %] 96 % (03/21 0424) Weight:  [48.9 kg-52.6 kg] 48.9 kg (03/21 0424) Weight change:  Last BM Date: 02/14/19  Intake/Output from previous day: 03/20 0701 - 03/21 0700 In: 240 [P.O.:240] Out: -  Last cbgs: CBG (last 3)  Recent Labs    02/15/19 0003 02/15/19 0422 02/15/19 0904  GLUCAP 103* 96 194*     Physical Exam General: No apparent distress   HEENT: not dry Lungs: Normal effort. Lungs clear to auscultation, no crackles or wheezes. Cardiovascular: Regular rate and rhythm, no edema. 2/3 murmur Abdomen: S/NT/ND; BS(+) Musculoskeletal:  unchanged Neurological: No new neurological deficits Wounds: N/A    Skin: clear  Aging changes Mental state: Alert, cooperative    Lab Results: BMET    Component Value Date/Time   NA 139 02/14/2019 0143   K 3.2 (L) 02/14/2019 0143   CL 100 02/14/2019 0143   CO2 31 02/14/2019 0143   GLUCOSE 540 (HH) 02/14/2019 1147   BUN 12 02/14/2019 0143   CREATININE 0.64 02/14/2019 0143   CALCIUM 8.2 (L) 02/14/2019 0143   GFRNONAA >60 02/14/2019 0143   GFRAA >60 02/14/2019 0143   CBC    Component Value Date/Time   WBC 7.8 02/12/2019 0358   RBC 4.04 02/12/2019 0358   RBC 4.04 02/12/2019 0358   HGB 11.8 (L) 02/12/2019 0358   HCT 37.6 02/12/2019 0358   PLT 242 02/12/2019 0358   MCV 93.1 02/12/2019 0358   MCH 29.2 02/12/2019 0358   MCHC 31.4 02/12/2019 0358   RDW 14.4 02/12/2019 0358   LYMPHSABS 1.1 02/06/2019 1957   MONOABS 0.8 02/06/2019 1957   EOSABS 0.1 02/06/2019 1957   BASOSABS 0.1 02/06/2019 1957    Studies/Results: No results  found.  Medications: I have reviewed the patient's current medications.  Assessment/Plan:  1.  Status post traumatic left frontal intraparenchymal hematoma.  CIR 2.  DVT prophylaxis with SCDs 3.  Emotional support 4.  Pain management with Lidoderm patch 5.  High-grade heart block with recurrent syncope.  Status post pacemaker insertion on 02/10/2019 - follow-up per electrophysiology 6.  Moderate to severe aortic stenosis.  Continue with Lopressor 25 mg twice a day 7.  Dysphagia.  Currently on clear liquid diet.  Will advance per speech therapy when appropriate 8.  Intermittent coffee-ground emesis..  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  Continue with PPI.  Follow-up with GI 9.  Diabetes with neuropathy.  Currently on Lantus.  The patient was on Tresiba prior to admission   Length of stay, days: 1  Sonda Primes , MD 02/15/2019, 9:35 AM

## 2019-02-15 NOTE — Evaluation (Signed)
Speech Language Pathology Assessment and Plan  Patient Details  Name: Danielle Clarke MRN: [id number] Date of Birth: 1941/03/05  SLP Diagnosis: Cognitive Impairments;Dysphagia  Rehab Potential: Good ELOS: 10-14 days     Today's Date: 02/15/2019 SLP Individual Time: 0803-0900 SLP Individual Time Calculation (min): 57 min   Problem List:  Patient Active Problem List   Diagnosis Date Noted  . Traumatic cerebral intraparenchymal hematoma ([place]) 02/14/2019  . Pacemaker   . Labile blood glucose   . Diabetes mellitus type 2 in nonobese (HCC)   . Dyslipidemia   . Essential hypertension   . Hypernatremia   . Anemia of chronic disease   . Hypertensive urgency 02/07/2019  . Intracerebral hemorrhage ([place]) 02/07/2019  . Intracranial bleeding ([place]) 02/06/2019  . Elevated troponin   . Abnormal EKG   . Bronchitis 08/05/2017  . Bilateral carotid artery stenosis   . Aortic stenosis 08/25/2015  . Cervical stenosis of spinal canal 07/31/2014  . CKD (chronic kidney disease) stage 2, GFR 60-89 ml/min 12/30/2013  . Essential hypertension, benign 12/30/2013  . Hyperlipidemia 12/30/2013  . Toe infection 12/30/2013  . Anemia 12/30/2013  . Acute renal failure ([place]) 12/29/2013  . Syncope 01/02/2013  . Diabetes mellitus type 1 ([place]) 01/02/2013  . Bilateral carotid artery disease ([place]) 01/02/2013  . GOITER, MULTINODULAR 02/11/2008   Past Medical History:  Past Medical History:  Diagnosis Date  . Aortic stenosis, mild   . Arthritis   . Asthmatic bronchitis    with colds per patient  . Carotid artery disease (HCC)    40-59% bilateral ICA stenosis  . Cataract   . Cutaneous abscess of right foot   . Glaucoma   . Heart murmur    Dr Tamala Julian is her  cardiologist.   . Hyperlipemia   . Hypertension    Dr. Tamala Julian ~ 2 years ago  . Neck fracture [facility])    july 2013  . Osteoporosis   . Syncope   . Type 1 diabetes mellitus ([place])    Past Surgical History:  Past Surgical History:  Procedure  Laterality Date  . ANKLE FRACTURE SURGERY Left 2001   steel plate and 3 screws   . ANTERIOR CERVICAL CORPECTOMY N/A 07/31/2014   Procedure: Cervical Four to Cervical Six Corpectomy;  Surgeon: Floyce Stakes, MD;  Location: [facility];  Service: Neurosurgery;  Laterality: N/A;  C4 to C6 Corpectomy  . BREAST SURGERY  1988  . CATARACT EXTRACTION W/ INTRAOCULAR LENS  IMPLANT, BILATERAL Bilateral 2011   right and then left  . EYE SURGERY    . FRACTURE SURGERY    . [place]  . I&D EXTREMITY Right 09/27/2018   Procedure: IRRIGATION AND DEBRIDEMENT RIGHT FOOT;  Surgeon: Newt Minion, MD;  Location: [place];  Service: Orthopedics;  Laterality: Right;  . JOINT REPLACEMENT    . PACEMAKER IMPLANT N/A 02/10/2019   Procedure: PACEMAKER IMPLANT;  Surgeon: Evans Lance, MD;  Location: [place] [facility];  Service: Cardiovascular;  Laterality: N/A;  . TONSILLECTOMY AND ADENOIDECTOMY  1948  . TOTAL SHOULDER REPLACEMENT  2010   right shoulder   . TUBAL LIGATION  1979  . TYMPANOSTOMY TUBE PLACEMENT Bilateral     Assessment / Plan / Recommendation Clinical Impression   Danielle Clarke is a 78 year old right-handed female with history of diabetes mellitus maintained on Tresiba 10 mg daily, hyperlipidemia , hypertension, mild aortic stenosis, carotid artery disease maintained on aspirin 81 mg daily, anterior cervical corpectomy,  recurrent syncopal episodes followed by cardiology services. Per chart review patient and daughter, patient lives alone. One level home with 4 steps to entry. Perform all ADLs and driving. She does use a cane when needed. Family in the area works. Presented 02/07/2019 after syncopal episode while at the dental office. Daughter reports that she struck her head. No seizure activity noted. Blood sugars greater than 200. Cranial CT/MRI showed left frontal intraparenchymal hematoma with volume of approximately 10 mL. Posterior right parietal scalp hematoma consistent with  contrecoup pattern of injury. No acute fracture. No underlying mass lesion. There was a 4 mm left-to-right midline shift. No hydrocephalus. Neurosurgery Dr. Kathyrn Sheriff advise conservative care. Noted blood pressure elevated maintained on nifedipine drip as well as heart rate 100s. Echocardiogram with ejection fraction of 86% normal systolic function. EP follow-up for multiple episodes of intermittent high-grade heart block identified on telemetry followed by cardiology services in the past with troponin negative. 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 Osie Cheeks. 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 Plan currently is to continue to monitor no plan for endoscopy at this time. No further episodes reported hemoglobin remains stable 11.8. Patient currently a full liquid diet nasogastric tube in place for nutritional supportthat was later discontinued. Close monitoring of blood pressure on Lopressor 25 mg twice a day. Therapy evaluations completed with recommendations of physical medicine rehabilitation consult. Patient was admitted for a comprehensive rehabilitation program on 02/14/2019.  SLP evaluation was completed on 02/15/2019 with the following results: Pt presents with no overt s/s of aspiration when consuming thin liquids and some purees per full liquids diet.  No complaints of globus sensation or obvious signs of fatigue or decreased endurane during her meal although pt does appear generally debilitated from her hospital course and had difficulty sitting unsupported at edge of bed due to back pain.  For now, recommend that pt remain on a full liquids diet with full supervision for use of swallowing precautions.  Prognosis for advancement is good with ongoing trials with SLP.   Additionally, pt presents with mild-moderate  cognitive deficits which appeared to fluctuate within session.  At times, pt was able to answer questions appropriately and fluently whereas other times pt demonstrated significantly increased response latency and would answer questions inappropriately.  [name] processing is slowed which leads to decreased sustained attention to tasks, decreased recall of information, decreased functional problem solving, and decreased awareness of deficits.  As a result, pt currently requires mod assist to complete tasks.   Given the abovmentioned deficits, pt would benefit from skilled ST while inpatient in order to maximize functional independence and reduce burden of care prior to discharge.  Anticipate that pt  will need 24/7 supervision at [place] in addition to [place] follow up at next level of care.    Skilled Therapeutic Interventions          Cognitive-linguistic and bedside swallow evaluation completed with results and recommendations reviewed with family.     SLP Assessment  Patient will need skilled Speech Lanaguage Pathology Services during CIR admission    Recommendations  SLP Diet Recommendations: No solids, see liquids;Thin Liquid Administration via: Cup;No straw Medication Administration: Crushed with puree Supervision: Patient able to self feed;Full supervision/cueing for compensatory strategies Compensations: Small sips/bites;Slow rate;Multiple dry swallows after each bite/sip;Clear throat intermittently Postural Changes and/or Swallow Maneuvers: Seated upright 90 degrees Oral Care Recommendations: Oral  care BID Recommendations for Other Services: Neuropsych consult Patient destination: Home Follow up Recommendations: Home Health SLP;Outpatient SLP;24 hour supervision/assistance Equipment Recommended: None recommended by SLP    SLP Frequency 3 to 5 out of 7 days   SLP Duration  SLP Intensity  SLP Treatment/Interventions 10-14 days   Minumum of 1-2 x/day, 30 to 90 minutes  Cognitive  remediation/compensation;Cueing hierarchy;Dysphagia/aspiration precaution training;Internal/external aids;Environmental controls;Functional tasks;Patient/family education    Pain Pain Assessment Pain Scale: 0-10 Pain Score: 8  Faces Pain Scale: Hurts little more Pain Type: Acute pain Pain Location: Rib cage Pain Orientation: Right Pain Descriptors / Indicators: Aching Pain Frequency: Intermittent Pain Onset: With Activity Patients Stated Pain Goal: 0 Pain Intervention(s): Medication (See eMAR)  Prior Functioning Cognitive/Linguistic Baseline: Within functional limits Type of Home: House  Lives With: Alone Available Help at Discharge: Family;Available 24 hours/day(per PMR preadmission report) Vocation: Retired  Industrial/product designer Term Goals: Week 1: SLP Short Term Goal 1 (Week 1): Pt will consume trials of dys 2 textures with minimal overt s/s of aspiration and supervision cues for use of swallowing precautions.  SLP Short Term Goal 2 (Week 1): Pt will sustain her attention to mildly complex tasks for 10 minute intervals with min verbal cues for redirection.   SLP Short Term Goal 3 (Week 1): Pt will complete mildly complex tasks with min assist for functional problem solving.   SLP Short Term Goal 4 (Week 1): Pt will recall basic, daily information with min assist verbal cues for use of external aids.    Refer to Care Plan for Long Term Goals  Recommendations for other services: Neuropsych  Discharge Criteria: Patient will be discharged from SLP if patient refuses treatment 3 consecutive times without medical reason, if treatment goals not met, if there is a change in medical status, if patient makes no progress towards goals or if patient is discharged from hospital.  The above assessment, treatment plan, treatment alternatives and goals were discussed and mutually agreed upon: by patient  Emilio Math 02/15/2019, 12:40 PM

## 2019-02-16 ENCOUNTER — Inpatient Hospital Stay (HOSPITAL_COMMUNITY): Payer: Medicare Other

## 2019-02-16 DIAGNOSIS — R0789 Other chest pain: Secondary | ICD-10-CM

## 2019-02-16 DIAGNOSIS — E1165 Type 2 diabetes mellitus with hyperglycemia: Secondary | ICD-10-CM

## 2019-02-16 DIAGNOSIS — R7989 Other specified abnormal findings of blood chemistry: Secondary | ICD-10-CM

## 2019-02-16 LAB — COMPREHENSIVE METABOLIC PANEL
ALT: 35 U/L (ref 0–44)
AST: 36 U/L (ref 15–41)
Albumin: 2.5 g/dL — ABNORMAL LOW (ref 3.5–5.0)
Alkaline Phosphatase: 85 U/L (ref 38–126)
Anion gap: 9 (ref 5–15)
BUN: 18 mg/dL (ref 8–23)
CO2: 29 mmol/L (ref 22–32)
Calcium: 8.3 mg/dL — ABNORMAL LOW (ref 8.9–10.3)
Chloride: 97 mmol/L — ABNORMAL LOW (ref 98–111)
Creatinine, Ser: 0.76 mg/dL (ref 0.44–1.00)
GFR calc Af Amer: >60 mL/min (ref >60)
GFR calc non Af Amer: >60 mL/min (ref >60)
Glucose, Bld: 218 mg/dL — ABNORMAL HIGH (ref 70–99)
Potassium: 4.6 mmol/L (ref 3.5–5.1)
Sodium: 135 mmol/L (ref 135–145)
Total Bilirubin: 0.4 mg/dL (ref 0.3–1.2)
Total Protein: 5.3 g/dL — ABNORMAL LOW (ref 6.5–8.1)

## 2019-02-16 LAB — CBC WITH DIFFERENTIAL/PLATELET
Abs Immature Granulocytes: 0.07 10*3/uL (ref 0.00–0.07)
Basophils Absolute: 0 10*3/uL (ref 0.0–0.1)
Basophils Relative: 0 %
Eosinophils Absolute: 0.2 10*3/uL (ref 0.0–0.5)
Eosinophils Relative: 2 %
HCT: 31.5 % — ABNORMAL LOW (ref 36.0–46.0)
Hemoglobin: 10.6 g/dL — ABNORMAL LOW (ref 12.0–15.0)
Immature Granulocytes: 1 %
Lymphocytes Relative: 14 %
Lymphs Abs: 1.2 10*3/uL (ref 0.7–4.0)
MCH: 30.1 pg (ref 26.0–34.0)
MCHC: 33.7 g/dL (ref 30.0–36.0)
MCV: 89.5 fL (ref 80.0–100.0)
Monocytes Absolute: 1 10*3/uL (ref 0.1–1.0)
Monocytes Relative: 11 %
Neutro Abs: 6.1 10*3/uL (ref 1.7–7.7)
Neutrophils Relative %: 72 %
Platelets: 173 10*3/uL (ref 150–400)
RBC: 3.52 MIL/uL — ABNORMAL LOW (ref 3.87–5.11)
RDW: 14 % (ref 11.5–15.5)
WBC: 8.5 10*3/uL (ref 4.0–10.5)
nRBC: 0 % (ref 0.0–0.2)

## 2019-02-16 LAB — GLUCOSE, CAPILLARY
Glucose-Capillary: 129 mg/dL — ABNORMAL HIGH (ref 70–99)
Glucose-Capillary: 154 mg/dL — ABNORMAL HIGH (ref 70–99)
Glucose-Capillary: 170 mg/dL — ABNORMAL HIGH (ref 70–99)
Glucose-Capillary: 197 mg/dL — ABNORMAL HIGH (ref 70–99)
Glucose-Capillary: 202 mg/dL — ABNORMAL HIGH (ref 70–99)
Glucose-Capillary: 241 mg/dL — ABNORMAL HIGH (ref 70–99)
Glucose-Capillary: 305 mg/dL — ABNORMAL HIGH (ref 70–99)
Glucose-Capillary: 42 mg/dL — CL (ref 70–99)
Glucose-Capillary: 76 mg/dL (ref 70–99)

## 2019-02-16 LAB — MAGNESIUM: Magnesium: 1.7 mg/dL (ref 1.7–2.4)

## 2019-02-16 LAB — D-DIMER, QUANTITATIVE: D-Dimer, Quant: 2.34 ug/mL-FEU — ABNORMAL HIGH (ref 0.00–0.50)

## 2019-02-16 MED ORDER — LIDOCAINE 5 % EX PTCH
1.0000 | MEDICATED_PATCH | CUTANEOUS | Status: DC
  Administered 2019-02-16 – 2019-02-27 (×12): 1 via TRANSDERMAL
  Filled 2019-02-16 (×12): qty 1

## 2019-02-16 MED ORDER — LORAZEPAM 0.5 MG PO TABS: 0.5000 mg | ORAL_TABLET | Freq: Two times a day (BID) | ORAL | Status: DC | PRN

## 2019-02-16 MED ORDER — SODIUM CHLORIDE 0.9 % IV SOLN
INTRAVENOUS | Status: DC
  Administered 2019-02-16 – 2019-02-18 (×3): via INTRAVENOUS

## 2019-02-16 NOTE — Progress Notes (Signed)
Hypoglycemic Event  CBG: 42  Treatment: 8 oz juice/soda  Symptoms: Pale  Follow-up CBG: Time:0440 CBG Result:76  Possible Reasons for Event: Medication regimen: insulin  Comments/MD notified:will monitor    [employer]

## 2019-02-16 NOTE — Progress Notes (Addendum)
Danielle Clarke is a 78 y.o. female [birth year] [id number]  Subjective: No new complaints.    Per staff: Fluctuating sugars yesterday and this morning ranging between 400s and 40s over the past 24 hours.  Low diastolic blood pressure.  There was increased urinary output.  Right-sided chest pain and side pain last night.  The patient slept well.    The patient offers no active complaints this morning.  Feeling OK.  Objective: Vital signs in last 24 hours: Temp:  [96.6 F (35.9 C)-99.2 F (37.3 C)] 98.5 F (36.9 C) (03/22 1117) Pulse Rate:  [75-82] 77 (03/22 1117) Resp:  [14-19] 14 (03/22 1117) BP: (116-184)/(40-60) 126/41 (03/22 1117) SpO2:  [97 %-100 %] 97 % (03/22 1117) Weight:  [49.1 kg] 49.1 kg (03/22 0414) Weight change: -3.517 kg Last BM Date: 02/15/19  Intake/Output from previous day: 03/21 0701 - 03/22 0700 In: 1680 [P.O.:1680] Out: 300 [Urine:300] Last cbgs: CBG (last 3)  Recent Labs    02/16/19 0439 02/16/19 0627 02/16/19 0841  GLUCAP 76 202* 241*     Physical Exam General: No apparent distress   HEENT: not dry Lungs: Normal effort. Lungs clear to auscultation, no crackles or wheezes. Cardiovascular: Regular rate and rhythm, no edema.  There is great 2/6 systolic murmur. Abdomen: S/NT/ND; BS(+) Musculoskeletal:  Unchanged.  Chest wall is nontender to palpation, for the exception of a small area 2 inches in diameter on the right breast anteriorly right over the ribs.  Otherwise there is no flank pain.  I was able to move her hips lower back without a problem. Neurological: No new neurological deficits Wounds: N/A    Skin: clear  Aging changes.  No rash Mental state: Alert, oriented, cooperative    Lab Results: BMET    Component Value Date/Time   NA 139 02/14/2019 0143   K 3.2 (L) 02/14/2019 0143   CL 100 02/14/2019 0143   CO2 31 02/14/2019 0143   GLUCOSE 540 (HH) 02/14/2019 1147   BUN 12 02/14/2019 0143   CREATININE 0.64 02/14/2019 0143   CALCIUM 8.2 (L) 02/14/2019 0143   GFRNONAA >60 02/14/2019 0143   GFRAA >60 02/14/2019 0143   CBC    Component Value Date/Time   WBC 7.8 02/12/2019 0358   RBC 4.04 02/12/2019 0358   RBC 4.04 02/12/2019 0358   HGB 11.8 (L) 02/12/2019 0358   HCT 37.6 02/12/2019 0358   PLT 242 02/12/2019 0358   MCV 93.1 02/12/2019 0358   MCH 29.2 02/12/2019 0358   MCHC 31.4 02/12/2019 0358   RDW 14.4 02/12/2019 0358   LYMPHSABS 1.1 02/06/2019 1957   MONOABS 0.8 02/06/2019 1957   EOSABS 0.1 02/06/2019 1957   BASOSABS 0.1 02/06/2019 1957    Studies/Results: No results found.  Medications: I have reviewed the patient's current medications.  Assessment/Plan:   1.  Status post traumatic left frontal intraparenchymal hematoma.  Continue with CIR 2.  DVT prophylaxis with SCDs 3.  Emotional support 4.  Pain management with Lidoderm patch and Tylenol 5.  High-grade heart block with recurrent syncope.  Status post pacemaker insertion on 02/10/2019.  Follow-up with electrophysiology 6.  Moderate to severe aortic stenosis.  Continue with Lopressor 25 mg twice a day 7.  Dysphagia.  Currently on clear liquid diet.  Will advance per speech therapy when appropriate. 8.  Intermittent coffee-ground emesis.  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  Continue with a PPI.  Follow-up with GI 9.  Diabetic neuropathy. 10.  Diabetes mellitus with fluctuating sugars lately.  Will  titrate Lantus and sliding scale with caution. 11.  Fluctuating blood pressure.  Obtain lab work.  We may need to reduce Lopressor to 12.5 mg twice a day 12.  Increased urinary output-could be related to hyperglycemia.  Will watch.  Of note the patient does not have an IV line at present.  May need to insert per IV team 13.  Localized chest wall pain of unclear etiology.  Monitor for shingles rash.  Doubt PE.  Obtain bedside chest x-ray and a d-dimer.  Most likely it is a musculoskeletal chest wall pain.  We can use ice or heat as needed.   Addendum  (2 pm): I reviewed labs and a chest x-ray ordered earlier today.  I discussed the case with Dr.Kaminemi when she called me -her help and her input are very appreciated!  14.  Elevated d-dimer.  This obviously represents a difficult situation in the patient with a brain hemorrhage and a recurrent upper GI bleeding problem.  I will obtain venous Doppler ultrasound of the lower extremities.  If the test is positive for venous thrombosis would consider inferior vena cava filter placement.  If negative, will probably not pursue this elevated d-dimer any further.  The patient's right-sided chest pain seems to be musculoskeletal and is easily reproducible upon applying pressure over the ribs.  Will use Lidoderm patch as before.  15.  Right pleural effusion.  We will continue to monitor.  16.  Poor oral intake.  Well place a peripheral IV line and start normal saline at 50 cc/h.  Obtain labs tomorrow.  Late addendum (11 pm): Events noted (shortness of breath and tach, sweats, drop in O2 sats during her meal) - ?aspiration vs other.  In respect to her IV fluids the event took place after only 100 cc of normal saline was infused.    Chest x-ray ordered by rapid response team was fairly unchanged from before.    Head CT scan ordered for mental status change was unremarkable as well.    Bilateral lower extremity venous Doppler was negative for DVT.    The patient is currently n.p.o. on IV fluids.  Will use a low-dose lorazepam as needed for agitation.  Continue to monitor for other symptoms.  Length of stay, days: 2  Sonda Primes , MD 02/16/2019, 11:19 AM

## 2019-02-16 NOTE — Progress Notes (Signed)
RN called regarding an acute episode on SOB, diaphoretic and agitation. Pt began coughing and got "choked" while eating dinner. CXR ordered to rule out aspiration. I was on another call when this RN called, on my arrival pt sitting upright in bed, no distress noted, alert and oriented to person, place, time and situation.  Right lung sounds diminished. No crackles or wheezing. Advised to call RRT as needed.

## 2019-02-16 NOTE — Progress Notes (Signed)
Nurse tech called nurse to room to check patient. Nurse tech stated I think the patient got chocked on her food". Upon entering the the room the patient was visibly  SHOB and diaphoretic. Patient was disoriented and very agitated. Vitals obtained 152/45 pulse 72 respirations 22 temp 99.1and O2 sat was 86. HOB was raised. nurse called charge nurse to come assist. Sugar was obtained -172.  Rapid Response nurse was also called.Rapid response ordered a chest x-ray to rule out aspiration. After Rapid nurse left patient had some increased expressive aphagia. Nurse stayed with patient until CXR came.  Called Dr. Posey Rea gave report on patient status- Per Dr. Posey Rea orders for placed for STAT head CT, PRN O2, lorazepam 0.5 BID PRN agitation.   Nurse reported off to night shift nurse patient status.  Lorri Frederick, LPN

## 2019-02-16 NOTE — Progress Notes (Signed)
Lower extremity venous duplex has been completed.   Preliminary results in CV Proc.   Blanch Media 02/16/2019 2:56 PM

## 2019-02-17 ENCOUNTER — Inpatient Hospital Stay (HOSPITAL_COMMUNITY): Payer: Medicare Other | Admitting: Occupational Therapy

## 2019-02-17 ENCOUNTER — Inpatient Hospital Stay (HOSPITAL_COMMUNITY): Payer: Medicare Other

## 2019-02-17 DIAGNOSIS — E119 Type 2 diabetes mellitus without complications: Secondary | ICD-10-CM

## 2019-02-17 DIAGNOSIS — R1310 Dysphagia, unspecified: Secondary | ICD-10-CM

## 2019-02-17 DIAGNOSIS — J90 Pleural effusion, not elsewhere classified: Secondary | ICD-10-CM

## 2019-02-17 DIAGNOSIS — E46 Unspecified protein-calorie malnutrition: Secondary | ICD-10-CM

## 2019-02-17 DIAGNOSIS — R7309 Other abnormal glucose: Secondary | ICD-10-CM

## 2019-02-17 DIAGNOSIS — S2241XA Multiple fractures of ribs, right side, initial encounter for closed fracture: Secondary | ICD-10-CM

## 2019-02-17 DIAGNOSIS — E8809 Other disorders of plasma-protein metabolism, not elsewhere classified: Secondary | ICD-10-CM

## 2019-02-17 LAB — CBC WITH DIFFERENTIAL/PLATELET
Abs Immature Granulocytes: 0.07 10*3/uL (ref 0.00–0.07)
Basophils Absolute: 0 10*3/uL (ref 0.0–0.1)
Basophils Relative: 0 %
Eosinophils Absolute: 0.2 10*3/uL (ref 0.0–0.5)
Eosinophils Relative: 2 %
HCT: 33.3 % — ABNORMAL LOW (ref 36.0–46.0)
Hemoglobin: 10.7 g/dL — ABNORMAL LOW (ref 12.0–15.0)
Immature Granulocytes: 1 %
Lymphocytes Relative: 15 %
Lymphs Abs: 1.5 10*3/uL (ref 0.7–4.0)
MCH: 28.8 pg (ref 26.0–34.0)
MCHC: 32.1 g/dL (ref 30.0–36.0)
MCV: 89.5 fL (ref 80.0–100.0)
Monocytes Absolute: 1.1 10*3/uL — ABNORMAL HIGH (ref 0.1–1.0)
Monocytes Relative: 11 %
Neutro Abs: 7.3 10*3/uL (ref 1.7–7.7)
Neutrophils Relative %: 71 %
Platelets: 223 10*3/uL (ref 150–400)
RBC: 3.72 MIL/uL — ABNORMAL LOW (ref 3.87–5.11)
RDW: 13.9 % (ref 11.5–15.5)
WBC: 10.2 10*3/uL (ref 4.0–10.5)
nRBC: 0 % (ref 0.0–0.2)

## 2019-02-17 LAB — COMPREHENSIVE METABOLIC PANEL
ALT: 41 U/L (ref 0–44)
AST: 37 U/L (ref 15–41)
Albumin: 2.7 g/dL — ABNORMAL LOW (ref 3.5–5.0)
Alkaline Phosphatase: 102 U/L (ref 38–126)
Anion gap: 10 (ref 5–15)
BUN: 13 mg/dL (ref 8–23)
CO2: 27 mmol/L (ref 22–32)
Calcium: 8.4 mg/dL — ABNORMAL LOW (ref 8.9–10.3)
Chloride: 99 mmol/L (ref 98–111)
Creatinine, Ser: 0.7 mg/dL (ref 0.44–1.00)
GFR calc Af Amer: >60 mL/min (ref >60)
GFR calc non Af Amer: >60 mL/min (ref >60)
Glucose, Bld: 158 mg/dL — ABNORMAL HIGH (ref 70–99)
Potassium: 4.2 mmol/L (ref 3.5–5.1)
Sodium: 136 mmol/L (ref 135–145)
Total Bilirubin: 0.4 mg/dL (ref 0.3–1.2)
Total Protein: 5.8 g/dL — ABNORMAL LOW (ref 6.5–8.1)

## 2019-02-17 LAB — GLUCOSE, CAPILLARY
Glucose-Capillary: 101 mg/dL — ABNORMAL HIGH (ref 70–99)
Glucose-Capillary: 113 mg/dL — ABNORMAL HIGH (ref 70–99)
Glucose-Capillary: 151 mg/dL — ABNORMAL HIGH (ref 70–99)
Glucose-Capillary: 249 mg/dL — ABNORMAL HIGH (ref 70–99)
Glucose-Capillary: 317 mg/dL — ABNORMAL HIGH (ref 70–99)
Glucose-Capillary: 383 mg/dL — ABNORMAL HIGH (ref 70–99)

## 2019-02-17 MED ORDER — PRO-STAT SUGAR FREE PO LIQD
30.0000 mL | Freq: Two times a day (BID) | ORAL | Status: DC
  Administered 2019-02-17 – 2019-02-28 (×23): 30 mL via ORAL
  Filled 2019-02-17 (×22): qty 30

## 2019-02-17 NOTE — Progress Notes (Signed)
Social Work  Social Work Assessment and Plan  Patient Details  Name: Danielle Clarke MRN: [id number] Date of Birth: 15-Aug-1941  Today's Date: 02/17/2019  Problem List:  Patient Active Problem List   Diagnosis Date Noted  . Leukocytosis   . Lethargy   . Labile blood pressure   . 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 Acute blood loss anemia   . Low grade fever   . Hypoglycemia   . Multiple closed fractures of ribs of right side   . Pleural effusion on right   . Hypoalbuminemia due to protein-calorie malnutrition (HCC)   . Dysphagia   . Traumatic cerebral intraparenchymal hematoma (HCC) 02/14/2019  . Pacemaker   . Labile blood glucose   . Diabetes mellitus type 2 in nonobese (HCC)   . Dyslipidemia   . Essential hypertension   . Hypernatremia   . Anemia of chronic disease   . Hypertensive urgency 02/07/2019  . Intracerebral hemorrhage (HCC) 02/07/2019  . Intracranial bleeding (HCC) 02/06/2019  . Elevated troponin   . Abnormal EKG   . Bronchitis 08/05/2017  . Bilateral carotid artery stenosis   . Aortic stenosis 08/25/2015  . Cervical stenosis of spinal canal 07/31/2014  . CKD (chronic kidney disease) stage 2, GFR 60-89 ml/min 12/30/2013  . Essential hypertension, benign 12/30/2013  . Hyperlipidemia 12/30/2013  . Toe infection 12/30/2013  . Anemia 12/30/2013  . Acute renal failure (HCC) 12/29/2013  . Syncope 01/02/2013  . Diabetes mellitus type 1 (HCC) 01/02/2013  . Bilateral carotid artery disease (HCC) 01/02/2013  . GOITER, MULTINODULAR 02/11/2008   Past Medical History:  Past Medical History:  Diagnosis Date  . Aortic stenosis, mild   . Arthritis   . Asthmatic bronchitis    with colds per patient  . Carotid artery disease (HCC)    40-59% bilateral ICA stenosis  . Cataract   . Cutaneous abscess of right foot   . Glaucoma   . Heart murmur    Dr Katrinka Blazing is her  cardiologist.   . Hyperlipemia   . Hypertension    Dr. Katrinka Blazing ~ 2  years ago  . Neck fracture [facility])    july 2013  . Osteoporosis   . Syncope   . Type 1 diabetes mellitus (HCC)    Past Surgical History:  Past Surgical History:  Procedure Laterality Date  . ANKLE FRACTURE SURGERY Left 2001   steel plate and 3 screws   . ANTERIOR CERVICAL CORPECTOMY N/A 07/31/2014   Procedure: Cervical Four to Cervical Six Corpectomy;  Surgeon: Karn Cassis, MD;  Location: [facility];  Service: Neurosurgery;  Laterality: N/A;  C4 to C6 Corpectomy  . BREAST SURGERY  1988  . CATARACT EXTRACTION W/ INTRAOCULAR LENS  IMPLANT, BILATERAL Bilateral 2011   right and then left  . EYE SURGERY    . FRACTURE SURGERY    . HAMMER TOE SURGERY  1998  . I&D EXTREMITY Right 09/27/2018   Procedure: IRRIGATION AND DEBRIDEMENT RIGHT FOOT;  Surgeon: Nadara Mustard, MD;  Location: [facility] OR;  Service: Orthopedics;  Laterality: Right;  . JOINT REPLACEMENT    . PACEMAKER IMPLANT N/A 02/10/2019   Procedure: PACEMAKER IMPLANT;  Surgeon: Marinus Maw, MD;  Location: [facility] INVASIVE CV LAB;  Service: Cardiovascular;  Laterality: N/A;  . TONSILLECTOMY AND ADENOIDECTOMY  1948  . TOTAL SHOULDER REPLACEMENT  2010   right shoulder   . TUBAL LIGATION  1979  . TYMPANOSTOMY TUBE PLACEMENT Bilateral    Social  History:  reports that she has never smoked. She has never used smokeless tobacco. She reports current alcohol use of about 2.0 standard drinks of alcohol per week. She reports that she does not use drugs.  Family / Support Systems Marital Status: Divorced Patient Roles: Parent Children: daughter, Akea Sivertsen @ [phone] and son, Chip Veles @ [phone] Other Supports: daughter-in-law, Maddyson Dossantos @ [phone] Anticipated Caregiver: Daughter, [name] Ability/Limitations of Caregiver: daughter is a Financial controller; plans to take a leave from work and will share caregiver duties with her brother Caregiver Availability: 24/7  Social History Preferred language: English Religion:  Quaker Cultural Background: NA Read: Yes Write: Yes Employment Status: Retired Marine scientist Issues: None Guardian/Conservator: None - per MD, pt is not fully capable of making decisions on her own behalf - defer to daughter   Abuse/Neglect Abuse/Neglect Assessment Can Be Completed: Yes Physical Abuse: Denies Verbal Abuse: Denies Sexual Abuse: Denies Exploitation of patient/patient's resources: Denies Self-Neglect: Denies  Emotional Status [name] affect, behavior and adjustment status: Pt lying in bed and is unable to complete assessment interview due to cognitive deficits.  She does confirm demographic info and provide family contact info.  She is not oriented to situation.  She does not appear to be in any obvious emotional distress.  Will refer to neuropsychology for additional support given TBI. Recent Psychosocial Issues: None Psychiatric History: None Substance Abuse History: None  Patient / Family Perceptions, Expectations & Goals Pt/Family understanding of illness & functional limitations: Unable to confirm if pt understands the medical issues which led to her hospitalization.  States, "I think I was at the doctor's office... maybe the dentist...just didn't feel well.Marland KitchenMarland KitchenI don't know."  Daughter with good, general understanding of pt's TBI, other subsequent medical issues and current functional limitations/ need for CIR. Premorbid pt/family roles/activities: Daughter notes pt very independent and active at home and community. Anticipated changes in roles/activities/participation: Per supervision goals, daughter prepared to provide primary caregiver support. Pt/family expectations/goals: Pt unable to verbalize goals.  Daughter hopeful pt will eventually be able to resume independent living in her own home.  Community Resources [employer]: None Premorbid Home Care/DME Agencies: None Transportation available at discharge: yes Resource referrals recommended:  Neuropsychology, Support group (specify)  Discharge Planning Living Arrangements: Alone Support Systems: Children Type of Residence: Private residence Insurance Resources: Medicare(UHC Medicare) Financial Resources: Restaurant manager, fast food Screen Referred: No Living Expenses: Own Money Management: Patient Does the patient have any problems obtaining your medications?: No Home Management: pt Patient/Family Preliminary Plans: Daughter plans to take LOA from her work to stay with pt in pt's home and provide 24/7 assistance. Social Work Anticipated Follow Up Needs: HH/OP, Support Group Expected length of stay: 10-14 days  Clinical Impression Unfortunate woman here after a fall in the dental office and suffered TBI with ICH.  Very confused and not really able to complete full assessment interview - daughter provided majority of information.  Pt lying in bed and does not appear to be in any emotional distress at this time.  Daughter very involved and prepared to provide 24/7 assistance upon d/c.  Kato Wieczorek 02/17/2019, 3:30 PM

## 2019-02-17 NOTE — Progress Notes (Signed)
Physical Therapy Session Note  Patient Details  Name: Danielle Clarke MRN: [id number] Date of Birth: [birth year]  Today's Date: 02/17/2019 PT Individual Time: 1100-1140 PT Individual Time Calculation (min): 40 min  and Today's Date: 02/17/2019 PT Missed Time: 50 Minutes Missed Time Reason: Patient fatigue  Short Term Goals: Week 1:  PT Short Term Goal 1 (Week 1): Pt will participate in OOB activity w/o increase in pain PT Short Term Goal 2 (Week 1): Pt will ambulate 50' w/ LRAD w/ min assist PT Short Term Goal 3 (Week 1): Pt will initiate stair training  PT Short Term Goal 4 (Week 1): Pt will follow simple 2-step commands 50% of the time PT Short Term Goal 5 (Week 1): Pt will initiate functional mobility w/ min cues for initiation 100% of the time  Skilled Therapeutic Interventions/Progress Updates:    Pt supine in bed upon PT arrival, agreeable to therapy tx and no evidence of pain. Pt transferred to sitting EOB with min assist and increased time, use of bedrails. Pt performed squat pivot to w/c with mod assist and increased time, cues for techniques. Pt transported to the gym. Pt performed squat pivot to mat with mod assist, cues for techniques and increased time. Pt sitting edge of mat working on seated balance demonstrates increased work of breathing and difficulty keeping eyes open, pt reports she feels very tired. Vitals checked: SpO2 95% and BP 125/49. Pt demonstrates L lateral lean and trunk flexion with cues to correct, pt continues to close eyes intermittently. Therapist set up RW for sit<>stand but pt unable to perform secondary to fatigue and reports "please take me back to my room." Pt reports feeling very tired and wanting to rest. Pt transferred to w/c mod assist squat pivot and transported back to room. Pt transferred to bed squat pivot mod assist and sit>supine min assist. Pt left supine in bed with needs in reach, bed alarm set. Therapist reported episode of fatigue, shortness of  breath and lethargy noted this session to LPN (martha).  Pt missed 50 minutes of skilled therapy tx this session secondary to patient fatigue.   Therapy Documentation Precautions:  Precautions Precautions: Fall, ICD/Pacemaker Restrictions Weight Bearing Restrictions: No LUE Weight Bearing: Non weight bearing    Therapy/Group: Individual Therapy  Cresenciano Genre, PT, DPT 02/17/2019, 7:50 AM

## 2019-02-17 NOTE — Progress Notes (Signed)
Speech Language Pathology Daily Session Note  Patient Details  Name: LEANZA HUTCHCRAFT MRN: [id number] Date of Birth: 17-Aug-1941  Today's Date: 02/17/2019 SLP Individual Time: 1003-1100 SLP Individual Time Calculation (min): 57 min  Short Term Goals: Week 1: SLP Short Term Goal 1 (Week 1): Pt will consume trials of dys 2 textures with minimal overt s/s of aspiration and supervision cues for use of swallowing precautions.  SLP Short Term Goal 2 (Week 1): Pt will sustain her attention to mildly complex tasks for 10 minute intervals with min verbal cues for redirection.   SLP Short Term Goal 3 (Week 1): Pt will complete mildly complex tasks with min assist for functional problem solving.   SLP Short Term Goal 4 (Week 1): Pt will recall basic, daily information with min assist verbal cues for use of external aids.    Skilled Therapeutic Interventions:Skilled ST services focused on cognitive skills. SLP facilitated basic problem solving skills utilizing money management task, sorting coins, pt required max A verbal cues, providing two choices to sort in correct category. SLP facilitated basic problem solving and sustained attention skills in sorting days of the week and numbers 1-15 cards, pt required only physical assist reaching cards. SLP facilitated basic problem solving skills utilzling 3 step card sequence task, pt required total A to place 2 and 3 cards in order as well as to provide verbal description of action in picture. Pt demonstarted ability to name objects in room with 90% accuracy and objects in picture cards with 70% accuracy. Pt demonstrated neologisms, jargon and sterotypical utterance " I dont know. I really don't know" with appearing little to no awareness of verbal errors. Pt demonstrated ability to follow simple 1 step directions, however required max A demonstration to follow 2 step directions. SLP questions language impairment along with cognitive deficits. Pt demonstarted ability  to recall 3 out of 3 novel words with 30 seconds delay and with 1-2 minute delay recalled 2 out 3 words in a distracting environment.  Pt was left in room with call bell within reach and bed alarm set. ST recommends to continue skilled ST services.      Pain Pain Assessment Pain Score: 0-No pain  Therapy/Group: Individual Therapy  Demetrion Wesby  [facility] 02/17/2019, 4:42 PM

## 2019-02-17 NOTE — Progress Notes (Signed)
[facility] PHYSICAL MEDICINE & REHABILITATION PROGRESS NOTE  Subjective/Complaints: Patient seen laying in bed this morning.  She states she slept well overnight.  She states she had a good weekend.  She states she is ready for further therapies today.  Per chart review, patient had increased shortness of breath and expressive aphasia yesterday.  Stat head CT ordered, reviewed, unremarkable for acute intracranial process.  Bilateral lower extremity Dopplers negative for DVT.  She was made n.p.o. and started on IV fluids.  ROS: Denies CP, shortness of breath, nausea, vomiting, diarrhea.  Objective: Vital Signs: Blood pressure (!) 154/46, pulse 82, temperature 99.1 F (37.3 C), temperature source Oral, resp. rate 16, height 5' (1.524 m), weight 51.9 kg, SpO2 97 %. Ct Head Wo Contrast  Result Date: 02/16/2019 CLINICAL DATA:  Intracranial hemorrhage. EXAM: CT HEAD WITHOUT CONTRAST TECHNIQUE: Contiguous axial images were obtained from the base of the skull through the vertex without intravenous contrast. COMPARISON:  CT head without contrast 02/11/2019 and 02/07/2019. FINDINGS: Brain: There is expected evolution of a large anterior left frontal lobe hemorrhage. Surrounding edema is present. Midline shift is increased, now measuring 8 mm. There is partial effacement the ventral horn of the left lateral ventricle. Left subdural blood has slightly decreased. Hemorrhagic contusions are again noted within the anterior temporal lobes bilaterally. No new hemorrhage is present. Basal ganglia are intact. Brainstem and cerebellum are normal. Vascular: Atherosclerotic calcifications are present within the cavernous internal carotid arteries and at the dural margin of both vertebral arteries. There is no hyperdense vessel. Skull: Calvarium is intact. No focal lytic or blastic lesions are present. Sinuses/Orbits: A single anterior right ethmoid air cell is opacified. Bilateral lens replacements are noted. Globes and  orbits are otherwise unremarkable. IMPRESSION: 1. Expected evolution of anterior left frontal lobe hemorrhagic contusion. 2. Decreasing left frontal subdural blood. 3. Persistent surrounding edema with slight increase in midline shift. 4. Expected evolution of bilateral temporal tip hemorrhagic contusions. 5. No new hemorrhage. Electronically Signed   By: Marin Roberts M.D.   On: 02/16/2019 21:07   Dg Chest Port 1 View  Result Date: 02/16/2019 CLINICAL DATA:  Acute respiratory distress. EXAM: PORTABLE CHEST 1 VIEW COMPARISON:  02/16/2019 FINDINGS: 1921 hours. Left lung clear. Basilar atelectasis with probable layering effusion at the right base. The cardiopericardial silhouette is within normal limits for size. Permanent pacemaker noted. Acute fractures of the posterior right sixth, seventh, and eighth ribs noted. No pneumothorax. Status post right shoulder replacement. IMPRESSION: 1. Stable exam with right basilar atelectasis and effusion. 2. Acute fractures posterior right sixth through eighth ribs. Electronically Signed   By: Kennith Center M.D.   On: 02/16/2019 19:47   Dg Chest Port 1 View  Result Date: 02/16/2019 CLINICAL DATA:  Chest pain short of breath EXAM: PORTABLE CHEST 1 VIEW COMPARISON:  02/11/2019 FINDINGS: Dual lead pacemaker unchanged. Heart size upper normal. Negative for heart failure Small to moderate right pleural effusion similar to the prior study. Right lower lobe airspace disease unchanged. Improved aeration left lung which is now clear. IMPRESSION: Persistent right pleural effusion and right lower lobe airspace disease unchanged. Left lung base now clear. Electronically Signed   By: Marlan Palau M.D.   On: 02/16/2019 12:56   Vas [place] Lower Extremity Venous (dvt)  Result Date: 02/17/2019  Lower Venous Study Indications: Elevated d dimer.  Limitations: Patient pain tolerance. Performing Technologist: Blanch Media RVS  Examination Guidelines: A complete evaluation includes  B-mode imaging, spectral Doppler, color Doppler, and power  Doppler as needed of all accessible portions of each vessel. Bilateral testing is considered an integral part of a complete examination. Limited examinations for reoccurring indications may be performed as noted.  Right Venous Findings: +---------+---------------+---------+-----------+----------+-------------------+          CompressibilityPhasicitySpontaneityPropertiesSummary             +---------+---------------+---------+-----------+----------+-------------------+ CFV      Full           Yes      Yes                                      +---------+---------------+---------+-----------+----------+-------------------+ SFJ      Full                                                             +---------+---------------+---------+-----------+----------+-------------------+ FV Prox  Full                                                             +---------+---------------+---------+-----------+----------+-------------------+ FV Mid   Full                                                             +---------+---------------+---------+-----------+----------+-------------------+ FV Distal               Yes      Yes                  unable to compress                                                        due to patient pain                                                       tolerance           +---------+---------------+---------+-----------+----------+-------------------+ PFV      Full                                                             +---------+---------------+---------+-----------+----------+-------------------+ POP      Full           Yes      Yes                                      +---------+---------------+---------+-----------+----------+-------------------+  PTV      Full                                                              +---------+---------------+---------+-----------+----------+-------------------+ PERO     Full                                                             +---------+---------------+---------+-----------+----------+-------------------+  Left Venous Findings: +---------+---------------+---------+-----------+----------+-------+          CompressibilityPhasicitySpontaneityPropertiesSummary +---------+---------------+---------+-----------+----------+-------+ CFV      Full           Yes      Yes                          +---------+---------------+---------+-----------+----------+-------+ SFJ      Full                                                 +---------+---------------+---------+-----------+----------+-------+ FV Prox  Full                                                 +---------+---------------+---------+-----------+----------+-------+ FV Mid   Full                                                 +---------+---------------+---------+-----------+----------+-------+ FV DistalFull                                                 +---------+---------------+---------+-----------+----------+-------+ PFV      Full                                                 +---------+---------------+---------+-----------+----------+-------+ POP      Full           Yes      Yes                          +---------+---------------+---------+-----------+----------+-------+ PTV      Full                                                 +---------+---------------+---------+-----------+----------+-------+ PERO     Full                                                 +---------+---------------+---------+-----------+----------+-------+  Summary: Right: There is no evidence of deep vein thrombosis in the lower extremity. No cystic structure found in the popliteal fossa. Left: There is no evidence of deep vein thrombosis in the lower extremity. No cystic structure  found in the popliteal fossa.  *See table(s) above for measurements and observations. Electronically signed by Coral Else MD on 02/17/2019 at 8:39:03 AM.    Final    Recent Labs    02/16/19 1149 02/17/19 0618  WBC 8.5 10.2  HGB 10.6* 10.7*  HCT 31.5* 33.3*  PLT 173 223   Recent Labs    02/16/19 1149 02/17/19 0618  NA 135 136  K 4.6 4.2  CL 97* 99  CO2 29 27  GLUCOSE 218* 158*  BUN 18 13  CREATININE 0.76 0.70  CALCIUM 8.3* 8.4*    Physical Exam: BP (!) 154/46 (BP Location: Left Arm)   Pulse 82   Temp 99.1 F (37.3 C) (Oral)   Resp 16   Ht 5' (1.524 m)   Wt 51.9 kg   SpO2 97%   BMI 22.35 kg/m  Constitutional: No distress . Vital signs reviewed. HENT: Normocephalic.  Atraumatic. Eyes: EOMI. No discharge. Cardiovascular: RRR. No JVD. Respiratory: CTA Bilaterally. Normal effort. GI: BS +. Non-distended. Musc: No edema or tenderness in extremities. Neurological: Alert Fair insight and awareness.  Slow to process.  Motor: Grossly 4/5 throughout Skin: Skin iswarmand dry.  Psychiatric:Normal mood.  Normal behavior.  Assessment/Plan: 1. Functional deficits secondary to traumatic left frontal intraparenchymal hematoma which require 3+ hours per day of interdisciplinary therapy in a comprehensive inpatient rehab setting.  Physiatrist is providing close team supervision and 24 hour management of active medical problems listed below.  Physiatrist and rehab team continue to assess barriers to discharge/monitor patient progress toward functional and medical goals  Care Tool:  Bathing    Body parts bathed by patient: Right arm, Left arm, Chest, Abdomen, Front perineal area, Right upper leg, Left upper leg, Right lower leg, Left lower leg, Face   Body parts bathed by helper: Buttocks     Bathing assist Assist Level: Moderate Assistance - Patient 50 - 74%     Upper Body Dressing/Undressing Upper body dressing   What is the patient wearing?: Hospital gown  only    Upper body assist Assist Level: Minimal Assistance - Patient > 75%    Lower Body Dressing/Undressing Lower body dressing      What is the patient wearing?: Incontinence brief     Lower body assist Assist for lower body dressing: Moderate Assistance - Patient 50 - 74%     Toileting Toileting    Toileting assist Assist for toileting: Moderate Assistance - Patient 50 - 74%     Transfers Chair/bed transfer  Transfers assist     Chair/bed transfer assist level: Minimal Assistance - Patient > 75%     Locomotion Ambulation   Ambulation assist      Assist level: Minimal Assistance - Patient > 75% Assistive device: Walker-rolling Max distance: 15'   Walk 10 feet activity   Assist     Assist level: Minimal Assistance - Patient > 75% Assistive device: Walker-rolling   Walk 50 feet activity   Assist Walk 50 feet with 2 turns activity did not occur: Safety/medical concerns         Walk 150 feet activity   Assist Walk 150 feet activity did not occur: Safety/medical concerns         Walk 10 feet on uneven surface  activity   Assist Walk 10 feet on uneven surfaces activity did not occur: Safety/medical concerns         Wheelchair     Assist Will patient use wheelchair at discharge?: (TBD) Type of Wheelchair: Manual    Wheelchair assist level: Supervision/Verbal cueing Max wheelchair distance: 100'    Wheelchair 50 feet with 2 turns activity    Assist        Assist Level: Supervision/Verbal cueing   Wheelchair 150 feet activity     Assist Wheelchair 150 feet activity did not occur: Safety/medical concerns          Medical Problem List and Plan: 1.Decreased functional mobilitysecondary to traumatic left frontal intraparenchymal hematoma  Continue CIR  Notes reviewed-see above, images reviewed-latest CT stable, labs reviewed 2. Antithrombotics: -DVT/anticoagulation:SCDs -antiplatelet  therapy: None secondary to intraparenchymal hematoma 3. Pain Management:Lidoderm patch 4. Mood:Provide emotional support -antipsychotic agents: none 5. Neuropsych: This patientis?  Fully capable of making decisions on herown behalf. 6. Skin/Wound Care:Routine skin checks 7. Fluids/Electrolytes/Nutrition:Routine in and out's 8. 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 Follow-up per EP  9. Moderate severe aortic stenosis. Continue Lopressor 25 mg twice a day -follow for tolerance of physical activity with therapies 10. Dysphagia. Currently on a clear liquid diet.   Advance diet as tolerated  Continue IVF 11. Intermittent coffee-ground emesis. 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 Continue PPI.  Follow-up gastroenterology services currently with conservative care.  Hemoglobin 10.7 on 3/23  Continue to monitor 12. Diabetes mellitus  Patient had been on Tresiba 10 units daily prior to admission.  Currently on Lantus insulin10 units twice daily.  Check blood sugars before meals and at bedtime.   Labile on 3/23, monitor for trend 13. Hypoalbuminemia  Supplement on 3/23 14.  Right pleural effusion  Continue to monitor 15.  Rib fractures  Right 6-8  Conservative care  LOS: 3 days A FACE TO FACE EVALUATION WAS PERFORMED   Karis Juba 02/17/2019, 8:43 AM

## 2019-02-17 NOTE — Progress Notes (Signed)
Occupational Therapy Session Note  Patient Details  Name: Danielle Clarke MRN: [id number] Date of Birth: 04-14-1941  Today's Date: 02/17/2019 OT Individual Time: 3664-4034 OT Individual Time Calculation (min): 55 min   Short Term Goals: Week 1:  OT Short Term Goal 1 (Week 1): Pt will be able to don a shirt with min A demonstrating improved R shoulder AROM. OT Short Term Goal 2 (Week 1): Pt will be able to don pants over her feet with min A. OT Short Term Goal 3 (Week 1): Pt will be able to self cleanse after toileting with min A. OT Short Term Goal 4 (Week 1): Pt will complete toilet transfer with S recalling safe transfer steps to demonstrate improved cognition.  Skilled Therapeutic Interventions/Progress Updates:    Pt greeted semi-reclined in bed and agreeable to OT treatment session. OT asked pt about her episode of not feeling well last night and she was unable to recall. Pt needed increased time to come to sitting EOB. Pt with difficulty word finding at times, needing increased time and effort to express her thoughts. Pt completed stand-pivot to wc with min/mod A and verbal cues for technique and hand placement. Bathing/dressing completed sit<>stand from wc at the sink. Sit<>stand at the sink with min A, but then needed assistance to wash buttocks.  Pt had been incontinent of smear of BM and bladder and pt unaware. Pt constantly saying "ouch, ouch." when sitting or standing, but unable to express where she was hurting. Pt was able to don her shirt today but needed assistance to thread pant legs and pull them up. Pt reported max fatigue after standing to pull up pants and requested to return to bed. Stand-pivot back to bed with min A. Once demi-reclined in bed, pt began coughing and became short of breath once she stopped coughing. Pt unable to state what was wrong or how she was feeling. Nursing notified. BP 148/52 and SpO2 97-100%. Pt left in care of nursing staff.   Therapy  Documentation Precautions:  Precautions Precautions: Fall, ICD/Pacemaker Restrictions Weight Bearing Restrictions: No LUE Weight Bearing: Non weight bearing   Therapy/Group: Individual Therapy  Mal Amabile 02/17/2019, 9:40 AM

## 2019-02-18 ENCOUNTER — Inpatient Hospital Stay (HOSPITAL_COMMUNITY): Payer: Medicare Other | Admitting: Occupational Therapy

## 2019-02-18 ENCOUNTER — Inpatient Hospital Stay (HOSPITAL_COMMUNITY): Payer: Medicare Other | Admitting: Physical Therapy

## 2019-02-18 ENCOUNTER — Inpatient Hospital Stay (HOSPITAL_COMMUNITY): Payer: Medicare Other | Admitting: Speech Pathology

## 2019-02-18 DIAGNOSIS — R509 Fever, unspecified: Secondary | ICD-10-CM

## 2019-02-18 DIAGNOSIS — S2241XD Multiple fractures of ribs, right side, subsequent encounter for fracture with routine healing: Secondary | ICD-10-CM

## 2019-02-18 DIAGNOSIS — E162 Hypoglycemia, unspecified: Secondary | ICD-10-CM

## 2019-02-18 LAB — GLUCOSE, CAPILLARY
Glucose-Capillary: 149 mg/dL — ABNORMAL HIGH (ref 70–99)
Glucose-Capillary: 158 mg/dL — ABNORMAL HIGH (ref 70–99)
Glucose-Capillary: 184 mg/dL — ABNORMAL HIGH (ref 70–99)
Glucose-Capillary: 214 mg/dL — ABNORMAL HIGH (ref 70–99)
Glucose-Capillary: 312 mg/dL — ABNORMAL HIGH (ref 70–99)
Glucose-Capillary: 41 mg/dL — CL (ref 70–99)
Glucose-Capillary: 51 mg/dL — ABNORMAL LOW (ref 70–99)
Glucose-Capillary: 71 mg/dL (ref 70–99)

## 2019-02-18 MED ORDER — INSULIN GLARGINE 100 UNIT/ML ~~LOC~~ SOLN
10.0000 [IU] | Freq: Every day | SUBCUTANEOUS | Status: DC
  Administered 2019-02-18 – 2019-02-21 (×4): 10 [IU] via SUBCUTANEOUS
  Filled 2019-02-18 (×5): qty 0.1

## 2019-02-18 NOTE — Progress Notes (Signed)
Occupational Therapy Session Note  Patient Details  Name: Danielle Clarke MRN: [id number] Date of Birth: 08/29/1941  Today's Date: 02/18/2019 OT Individual Time: 4944-9675 OT Individual Time Calculation (min): 58 min    Short Term Goals: Week 1:  OT Short Term Goal 1 (Week 1): Pt will be able to don a shirt with min A demonstrating improved R shoulder AROM. OT Short Term Goal 2 (Week 1): Pt will be able to don pants over her feet with min A. OT Short Term Goal 3 (Week 1): Pt will be able to self cleanse after toileting with min A. OT Short Term Goal 4 (Week 1): Pt will complete toilet transfer with S recalling safe transfer steps to demonstrate improved cognition.  Skilled Therapeutic Interventions/Progress Updates:    Pt completed bathing and dressing during session.  Max instructional cueing to for participation as pt kept pointing to the bed and requesting to go back.  Therapist took her into the bathroom via wheelchair and then transferred her to the tub bench with mod assist.  Noted bowel incontinence in brief as it was removed before sitting on the tub bench with max assist from therapist.  Pt needed max instructional cueing to sequence through the bathing process as pt would continually state "I can't". Sometimes min demonstrational cueing was needed for her to initiate washing her hair and for crossing her legs to wash part of them.  Once bath was finished, had pt transfer to the wheelchair for work on dressing sit to stand at the sink.  She was able to donn a pullover shirt with min assist and then donned her brief, pants, and gripper socks with increased time and max instructional cueing with mod assist.  Finished session with return to the bed with mod assist for stand pivot transfer.  She transitioned to supine with close supervision to conclude session.  Call button and phone in reach with bed alarm in place as well.    Therapy Documentation Precautions:  Precautions Precautions:  Fall, ICD/Pacemaker Restrictions Weight Bearing Restrictions: No LUE Weight Bearing: Non weight bearing  Vital Signs: Therapy Vitals Temp: 97.6 F (36.4 C) Temp Source: Oral Pulse Rate: 76 Resp: 20 BP: (!) 116/37 Patient Position (if appropriate): Sitting Oxygen Therapy SpO2: 98 % O2 Device: Room Air Pain: Pain Assessment Pain Score: 0-No pain ADL: See Care Tool Section for some details of ADL  Therapy/Group: Individual Therapy  Bessie Livingood OTR/L 02/18/2019, 4:10 PM

## 2019-02-18 NOTE — Progress Notes (Signed)
Physical Therapy Session Note  Patient Details  Name: Danielle Clarke MRN: [id number] Date of Birth: February 21, 1941  Today's Date: 02/18/2019 PT Individual Time: 0845-0900 PT Individual Time Calculation (min): 15 min   Short Term Goals: Week 1:  PT Short Term Goal 1 (Week 1): Pt will participate in OOB activity w/o increase in pain PT Short Term Goal 2 (Week 1): Pt will ambulate 50' w/ LRAD w/ min assist PT Short Term Goal 3 (Week 1): Pt will initiate stair training  PT Short Term Goal 4 (Week 1): Pt will follow simple 2-step commands 50% of the time PT Short Term Goal 5 (Week 1): Pt will initiate functional mobility w/ min cues for initiation 100% of the time  Skilled Therapeutic Interventions/Progress Updates:    Pt received seated in bed. Pt asleep and is not easily arousable, minimally responsive and keeps her eyes closed when talking to therapist. Pt eventually agreeable to sit EOB. Sitting in bed to sitting EOB with mod A. Once seated EOB pt states "let me lay back down" and proceeds to return to sitting in bed with min A. Unable to convince patient to participate any further in therapy session due to lethargy and significant fatigue. Pt requests food at end of session, education with patient that she is currently on liquid diet. Pt left seated in bed with needs in reach, bed alarm in place. Pt missed 15 min of scheduled therapy session due to fatigue.  Therapy Documentation Precautions:  Precautions Precautions: Fall, ICD/Pacemaker Restrictions Weight Bearing Restrictions: No LUE Weight Bearing: Non weight bearing General: PT Amount of Missed Time (min): 15 Minutes PT Missed Treatment Reason: Patient fatigue    Therapy/Group: Individual Therapy   Peter Congo, PT, DPT   02/18/2019, 9:31 AM

## 2019-02-18 NOTE — Progress Notes (Signed)
Physical Therapy Session Note  Patient Details  Name: Danielle Clarke MRN: [id number] Date of Birth: [birth year]  Today's Date: 02/18/2019 PT Individual Time: 1235-1325 PT Individual Time Calculation (min): 50 min  and Today's Date: 02/18/2019 PT Missed Time: 25 Minutes Missed Time Reason: Patient fatigue;Other (Comment)(lethargy)  Short Term Goals: Week 1:  PT Short Term Goal 1 (Week 1): Pt will participate in OOB activity w/o increase in pain PT Short Term Goal 2 (Week 1): Pt will ambulate 50' w/ LRAD w/ min assist PT Short Term Goal 3 (Week 1): Pt will initiate stair training  PT Short Term Goal 4 (Week 1): Pt will follow simple 2-step commands 50% of the time PT Short Term Goal 5 (Week 1): Pt will initiate functional mobility w/ min cues for initiation 100% of the time  Skilled Therapeutic Interventions/Progress Updates:   Attempted to see pt for scheduled session, however pt w/ increased lethargy and unable to arouse despite max verbal and vestibular stimulation.   Returned to re-attempt visit and pt w/ nursing (performing I/O cath). Pt noticeably agitated by this and offered to assist pt to toilet to attempt at clean urine catch. Pt agreeable and transferred to EOB w/ mod assist, min-mod assist stand pivot to [facility]. Increased c/o R rib pain w/ transitional movements, resolves w/ rest and pt unable to rate. Pt w/ both void and BM, RN made aware. Min assist transfer to w/c via stand pivot, increased arousal by this point and pt able to participate more in transfer. Pt agreeable to sit up in w/c. Provided supervision and skilled cues while pt ate lunch. Verbal and visual cues for attention to task, appropriate initiation and termination of tasks, and short term memory. Ended session in w/c, all needs in reach. Opened up blinds in room, left pt's door open, and turned on TV to encourage increased arousal and to promote normal sleep/wake cycle.  Missed 25 min of skilled PT 2/2  lethargy/fatigue.  Therapy Documentation Precautions:  Precautions Precautions: Fall, ICD/Pacemaker Restrictions Weight Bearing Restrictions: No LUE Weight Bearing: Non weight bearing General: PT Amount of Missed Time (min): 25 Minutes PT Missed Treatment Reason: Patient fatigue;Other (Comment)(lethargy) Pain: Pain Assessment Faces Pain Scale: Hurts a little bit  Therapy/Group: Individual Therapy  Iwao Shamblin K Arick Mareno 02/18/2019, 1:28 PM

## 2019-02-18 NOTE — Plan of Care (Signed)
  Problem: Consults Goal: RH STROKE PATIENT EDUCATION Description See Patient Education module for education specifics  Outcome: Progressing   Problem: RH BLADDER ELIMINATION Goal: RH STG MANAGE BLADDER WITH ASSISTANCE Description STG Manage Bladder With min Assistance  Outcome: Progressing   Problem: RH SKIN INTEGRITY Goal: RH STG MAINTAIN SKIN INTEGRITY WITH ASSISTANCE Description STG Maintain Skin Integrity With min Assistance.  Outcome: Progressing Goal: RH STG ABLE TO PERFORM INCISION/WOUND CARE W/ASSISTANCE Description STG Able To Perform Incision/Wound Care With min Assistance.  Outcome: Progressing   Problem: RH SAFETY Goal: RH STG ADHERE TO SAFETY PRECAUTIONS W/ASSISTANCE/DEVICE Description STG Adhere to Safety Precautions With cues/reminders Assistance/Device.  Outcome: Progressing   Problem: RH PAIN MANAGEMENT Goal: RH STG PAIN MANAGED AT OR BELOW [name] PAIN GOAL Description At or below level 4  Outcome: Progressing   Problem: RH KNOWLEDGE DEFICIT Goal: RH STG INCREASE KNOWLEDGE OF HYPERTENSION Description Pt will be able to state understanding of management of hypertension using resources/handouts with reminders/cues  Outcome: Progressing Goal: RH STG INCREASE KNOWLEDGE OF DYSPHAGIA/FLUID INTAKE Description Able to manage dysphagia and fluid restrictions with cues/reminders  Outcome: Progressing Goal: RH STG INCREASE KNOWLEDGE OF STROKE PROPHYLAXIS Description Manage cardiac issues and prevention of secondary ICH using resources with cues/reminders  Outcome: Progressing Goal: RH STG INCREASE KNOWLEDGE OF DIABETES Description Pt will be able to state understanding of management of diabetes using resources/handouts with reminders/cues   Outcome: Progressing Goal: RH STG INCREASE KNOWLEGDE OF HYPERLIPIDEMIA Description Pt will be able to state understanding of management of hyperlipidemia using resources/handouts with reminders/cues   Outcome:  Progressing

## 2019-02-18 NOTE — Progress Notes (Signed)
[place] PHYSICAL MEDICINE & REHABILITATION PROGRESS NOTE  Subjective/Complaints: Patient seen laying in bed this morning.  She states she slept well overnight.  She states she still sleepy this morning.  ROS: Denies CP, shortness of breath, nausea, vomiting, diarrhea.  Objective: Vital Signs: Blood pressure (!) 155/49, pulse 87, temperature 100 F (37.8 C), temperature source Oral, resp. rate 15, height 5' (1.524 m), weight 51.6 kg, SpO2 99 %. Ct Head Wo Contrast  Result Date: 02/16/2019 CLINICAL DATA:  Intracranial hemorrhage. EXAM: CT HEAD WITHOUT CONTRAST TECHNIQUE: Contiguous axial images were obtained from the base of the skull through the vertex without intravenous contrast. COMPARISON:  CT head without contrast 02/11/2019 and 02/07/2019. FINDINGS: Brain: There is expected evolution of a large anterior left frontal lobe hemorrhage. Surrounding edema is present. Midline shift is increased, now measuring 8 mm. There is partial effacement the ventral horn of the left lateral ventricle. Left subdural blood has slightly decreased. Hemorrhagic contusions are again noted within the anterior temporal lobes bilaterally. No new hemorrhage is present. Basal ganglia are intact. Brainstem and cerebellum are normal. Vascular: Atherosclerotic calcifications are present within the cavernous internal carotid arteries and at the dural margin of both vertebral arteries. There is no hyperdense vessel. Skull: Calvarium is intact. No focal lytic or blastic lesions are present. Sinuses/Orbits: A single anterior right ethmoid air cell is opacified. Bilateral lens replacements are noted. Globes and orbits are otherwise unremarkable. IMPRESSION: 1. Expected evolution of anterior left frontal lobe hemorrhagic contusion. 2. Decreasing left frontal subdural blood. 3. Persistent surrounding edema with slight increase in midline shift. 4. Expected evolution of bilateral temporal tip hemorrhagic contusions. 5. No new  hemorrhage. Electronically Signed   By: Marin Roberts M.D.   On: 02/16/2019 21:07   Dg Chest Port 1 View  Result Date: 02/16/2019 CLINICAL DATA:  Acute respiratory distress. EXAM: PORTABLE CHEST 1 VIEW COMPARISON:  02/16/2019 FINDINGS: 1921 hours. Left lung clear. Basilar atelectasis with probable layering effusion at the right base. The cardiopericardial silhouette is within normal limits for size. Permanent pacemaker noted. Acute fractures of the posterior right sixth, seventh, and eighth ribs noted. No pneumothorax. Status post right shoulder replacement. IMPRESSION: 1. Stable exam with right basilar atelectasis and effusion. 2. Acute fractures posterior right sixth through eighth ribs. Electronically Signed   By: Kennith Center M.D.   On: 02/16/2019 19:47   Dg Chest Port 1 View  Result Date: 02/16/2019 CLINICAL DATA:  Chest pain short of breath EXAM: PORTABLE CHEST 1 VIEW COMPARISON:  02/11/2019 FINDINGS: Dual lead pacemaker unchanged. Heart size upper normal. Negative for heart failure Small to moderate right pleural effusion similar to the prior study. Right lower lobe airspace disease unchanged. Improved aeration left lung which is now clear. IMPRESSION: Persistent right pleural effusion and right lower lobe airspace disease unchanged. Left lung base now clear. Electronically Signed   By: Marlan Palau M.D.   On: 02/16/2019 12:56   Vas [place] Lower Extremity Venous (dvt)  Result Date: 02/17/2019  Lower Venous Study Indications: Elevated d dimer.  Limitations: Patient pain tolerance. Performing Technologist: Blanch Media RVS  Examination Guidelines: A complete evaluation includes B-mode imaging, spectral Doppler, color Doppler, and power Doppler as needed of all accessible portions of each vessel. Bilateral testing is considered an integral part of a complete examination. Limited examinations for reoccurring indications may be performed as noted.  Right Venous Findings:  +---------+---------------+---------+-----------+----------+-------------------+          CompressibilityPhasicitySpontaneityPropertiesSummary             +---------+---------------+---------+-----------+----------+-------------------+  CFV      Full           Yes      Yes                                      +---------+---------------+---------+-----------+----------+-------------------+ SFJ      Full                                                             +---------+---------------+---------+-----------+----------+-------------------+ FV Prox  Full                                                             +---------+---------------+---------+-----------+----------+-------------------+ FV Mid   Full                                                             +---------+---------------+---------+-----------+----------+-------------------+ FV Distal               Yes      Yes                  unable to compress                                                        due to patient pain                                                       tolerance           +---------+---------------+---------+-----------+----------+-------------------+ PFV      Full                                                             +---------+---------------+---------+-----------+----------+-------------------+ POP      Full           Yes      Yes                                      +---------+---------------+---------+-----------+----------+-------------------+ PTV      Full                                                             +---------+---------------+---------+-----------+----------+-------------------+  PERO     Full                                                             +---------+---------------+---------+-----------+----------+-------------------+  Left Venous Findings:  +---------+---------------+---------+-----------+----------+-------+          CompressibilityPhasicitySpontaneityPropertiesSummary +---------+---------------+---------+-----------+----------+-------+ CFV      Full           Yes      Yes                          +---------+---------------+---------+-----------+----------+-------+ SFJ      Full                                                 +---------+---------------+---------+-----------+----------+-------+ FV Prox  Full                                                 +---------+---------------+---------+-----------+----------+-------+ FV Mid   Full                                                 +---------+---------------+---------+-----------+----------+-------+ FV DistalFull                                                 +---------+---------------+---------+-----------+----------+-------+ PFV      Full                                                 +---------+---------------+---------+-----------+----------+-------+ POP      Full           Yes      Yes                          +---------+---------------+---------+-----------+----------+-------+ PTV      Full                                                 +---------+---------------+---------+-----------+----------+-------+ PERO     Full                                                 +---------+---------------+---------+-----------+----------+-------+    Summary: Right: There is no evidence of deep vein thrombosis in the lower extremity. No cystic structure found in the popliteal fossa. Left: There is no evidence of deep vein thrombosis in  the lower extremity. No cystic structure found in the popliteal fossa.  *See table(s) above for measurements and observations. Electronically signed by Coral Else MD on 02/17/2019 at 8:39:03 AM.    Final    Recent Labs    02/16/19 1149 02/17/19 0618  WBC 8.5 10.2  HGB 10.6* 10.7*  HCT 31.5* 33.3*   PLT 173 223   Recent Labs    02/16/19 1149 02/17/19 0618  NA 135 136  K 4.6 4.2  CL 97* 99  CO2 29 27  GLUCOSE 218* 158*  BUN 18 13  CREATININE 0.76 0.70  CALCIUM 8.3* 8.4*    Physical Exam: BP (!) 155/49 (BP Location: Left Arm)   Pulse 87   Temp 100 F (37.8 C) (Oral)   Resp 15   Ht 5' (1.524 m)   Wt 51.6 kg   SpO2 99%   BMI 22.23 kg/m  Constitutional: No distress . Vital signs reviewed. HENT: Normocephalic.  Atraumatic. Eyes: EOMI. No discharge. Cardiovascular: RRR.  No JVD. Respiratory: CTA bilaterally.  Normal effort. GI: BS +. Non-distended. Musc: No edema or tenderness in extremities. Neurological: Alert Fair insight and awareness.  Slow to process.  Motor: Grossly 4/5 throughout, stable Skin: Skin iswarmand dry.  Psychiatric:Normal mood.  Normal behavior.  Assessment/Plan: 1. Functional deficits secondary to traumatic left frontal intraparenchymal hematoma which require 3+ hours per day of interdisciplinary therapy in a comprehensive inpatient rehab setting.  Physiatrist is providing close team supervision and 24 hour management of active medical problems listed below.  Physiatrist and rehab team continue to assess barriers to discharge/monitor patient progress toward functional and medical goals  Care Tool:  Bathing    Body parts bathed by patient: Right arm, Left arm, Chest, Abdomen, Front perineal area, Right upper leg, Left upper leg, Right lower leg, Left lower leg, Face   Body parts bathed by helper: Buttocks     Bathing assist Assist Level: Moderate Assistance - Patient 50 - 74%     Upper Body Dressing/Undressing Upper body dressing   What is the patient wearing?: Bra, Pull over shirt    Upper body assist Assist Level: Moderate Assistance - Patient 50 - 74%    Lower Body Dressing/Undressing Lower body dressing      What is the patient wearing?: Underwear/pull up, Incontinence brief     Lower body assist Assist for lower  body dressing: Moderate Assistance - Patient 50 - 74%     Toileting Toileting    Toileting assist Assist for toileting: Moderate Assistance - Patient 50 - 74%     Transfers Chair/bed transfer  Transfers assist     Chair/bed transfer assist level: Moderate Assistance - Patient 50 - 74%     Locomotion Ambulation   Ambulation assist      Assist level: Minimal Assistance - Patient > 75% Assistive device: Walker-rolling Max distance: 15'   Walk 10 feet activity   Assist     Assist level: Minimal Assistance - Patient > 75% Assistive device: Walker-rolling   Walk 50 feet activity   Assist Walk 50 feet with 2 turns activity did not occur: Safety/medical concerns         Walk 150 feet activity   Assist Walk 150 feet activity did not occur: Safety/medical concerns         Walk 10 feet on uneven surface  activity   Assist Walk 10 feet on uneven surfaces activity did not occur: Safety/medical concerns  Wheelchair     Assist Will patient use wheelchair at discharge?: (TBD) Type of Wheelchair: Manual    Wheelchair assist level: Supervision/Verbal cueing Max wheelchair distance: 100'    Wheelchair 50 feet with 2 turns activity    Assist        Assist Level: Supervision/Verbal cueing   Wheelchair 150 feet activity     Assist Wheelchair 150 feet activity did not occur: Safety/medical concerns          Medical Problem List and Plan: 1.Decreased functional mobilitysecondary to traumatic left frontal intraparenchymal hematoma  Continue CIR 2. Antithrombotics: -DVT/anticoagulation:SCDs -antiplatelet therapy: None secondary to intraparenchymal hematoma 3. Pain Management:Lidoderm patch 4. Mood:Provide emotional support -antipsychotic agents: none 5. Neuropsych: This patientis?  Fully capable of making decisions on herown behalf. 6. Skin/Wound Care:Routine skin checks 7.  Fluids/Electrolytes/Nutrition:Routine in and out's 8. 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 Follow-up per EP  9. Moderate severe aortic stenosis. Continue Lopressor 25 mg twice a day -follow for tolerance of physical activity with therapies 10. Dysphagia. Currently on a clear liquid diet.   Advance diet as tolerated, will consider advancement after discussion with daughter  D/c IVF, improving fluid intake 11. Intermittent coffee-ground emesis. 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 Continue PPI.  Follow-up gastroenterology services currently with conservative care.  Hemoglobin 10.7 on 3/23  Continue to monitor 12. Diabetes mellitus  Patient had been on Tresiba 10 units daily prior to admission.  Lantus insulin10 units twice daily, decreased to daily on 3/24.  Check blood sugars before meals and at bedtime.   Extremely labile with hypoglycemia on 3/24 13. Hypoalbuminemia  Supplement initiated on 3/23 14.  Right pleural effusion  Continue to monitor 15.  Rib fractures  Right 6-8  Conservative care 16.  Low-grade temperature  ?  Trending up  CBC ordered for tomorrow  LOS: 4 days A FACE TO FACE EVALUATION WAS PERFORMED   Karis Juba 02/18/2019, 7:49 AM

## 2019-02-18 NOTE — Progress Notes (Signed)
Speech Language Pathology Daily Session Note  Patient Details  Name: Danielle Clarke MRN: [id number] Date of Birth: 1941/02/09  Today's Date: 02/18/2019 SLP Individual Time: 0930-0950 SLP Individual Time Calculation (min): 20 min and Today's Date: 02/18/2019 SLP Missed Time: 40 Minutes Missed Time Reason: Patient unwilling to participate;Patient fatigue  Short Term Goals: Week 1: SLP Short Term Goal 1 (Week 1): Patient will participate in divergent naming task identifying 10 items in concrete category. SLP Short Term Goal 2 (Week 1): Pt will sustain her attention to mildly complex tasks for 10 minute intervals with min verbal cues for redirection.   SLP Short Term Goal 3 (Week 1): Pt will complete mildly complex tasks with min assist for functional problem solving.   SLP Short Term Goal 4 (Week 1): Pt will recall basic, daily information with min assist verbal cues for use of external aids.   SLP Short Term Goal 5 (Week 1): Patient will participate in confrontation naming task with 80% accuracy and min verbal cues. SLP Short Term Goal 6 (Week 1): Patient will respond to basic biotraphical -wh questions with 80% accuracy and min verbal cues.  Skilled Therapeutic Interventions: Due to concerns for cognitive vs language impairments language evaluation was completed. 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 Patient's communicative attempts where effortful with word finding errors and perseverations observed. Patient scored the following on the Western Aphasia Bedside:  Western Aphasia Bedside Scores: Spontaneous speech content: 3/10 Spontaneous speech fluency: 4/10 Auditory verbal comprehension yes/no questions: 8/10 Sequential commands: 0/10 Repetition: 10/10 Object naming: 5/10 Bedside aphasia score: 51.67  Patient benefitted from phonemic and multiple choice cues during word finding attempts.  Patient declined to participate in writing or reading tasks perseverating on the phrase "no, no, no. just go". Despite encouragement and education regarding the rationale and need to participate in therapy patient continued to decline further participation, therefore, patient missed remaining 40 minutes of session. Patient left upright in bed with alarm on and all needs within reach. Continue with current plan of care.       Pain Pain Assessment Pain Scale: Faces Faces Pain Scale: Hurts little more Pain Type: Acute pain Pain Location: Rib cage Pain Orientation: Right Pain Descriptors / Indicators: Aching Pain Frequency: Intermittent(when coughing) Pain Onset: With Activity Patients Stated Pain Goal: 0 Pain Intervention(s): Medication (See eMAR)(tylenol)  Therapy/Group: Individual Therapy  Nikkia Devoss 02/18/2019, 10:16 AM

## 2019-02-19 ENCOUNTER — Inpatient Hospital Stay (HOSPITAL_COMMUNITY): Payer: Medicare Other | Admitting: Physical Therapy

## 2019-02-19 ENCOUNTER — Inpatient Hospital Stay (HOSPITAL_COMMUNITY): Payer: Medicare Other | Admitting: Speech Pathology

## 2019-02-19 ENCOUNTER — Inpatient Hospital Stay (HOSPITAL_COMMUNITY): Payer: Medicare Other

## 2019-02-19 ENCOUNTER — Encounter (HOSPITAL_COMMUNITY): Payer: Medicare Other | Admitting: Psychology

## 2019-02-19 DIAGNOSIS — D62 Acute posthemorrhagic anemia: Secondary | ICD-10-CM

## 2019-02-19 LAB — CBC
HCT: 30.1 % — ABNORMAL LOW (ref 36.0–46.0)
Hemoglobin: 9.8 g/dL — ABNORMAL LOW (ref 12.0–15.0)
MCH: 29.3 pg (ref 26.0–34.0)
MCHC: 32.6 g/dL (ref 30.0–36.0)
MCV: 89.9 fL (ref 80.0–100.0)
Platelets: 284 10*3/uL (ref 150–400)
RBC: 3.35 MIL/uL — ABNORMAL LOW (ref 3.87–5.11)
RDW: 14.3 % (ref 11.5–15.5)
WBC: 12.2 10*3/uL — ABNORMAL HIGH (ref 4.0–10.5)
nRBC: 0 % (ref 0.0–0.2)

## 2019-02-19 LAB — GLUCOSE, CAPILLARY
Glucose-Capillary: 211 mg/dL — ABNORMAL HIGH (ref 70–99)
Glucose-Capillary: 238 mg/dL — ABNORMAL HIGH (ref 70–99)
Glucose-Capillary: 275 mg/dL — ABNORMAL HIGH (ref 70–99)
Glucose-Capillary: 318 mg/dL — ABNORMAL HIGH (ref 70–99)
Glucose-Capillary: 329 mg/dL — ABNORMAL HIGH (ref 70–99)
Glucose-Capillary: 412 mg/dL — ABNORMAL HIGH (ref 70–99)
Glucose-Capillary: 469 mg/dL — ABNORMAL HIGH (ref 70–99)
Glucose-Capillary: 92 mg/dL (ref 70–99)

## 2019-02-19 NOTE — Progress Notes (Signed)
Occupational Therapy Session Note  Patient Details  Name: Danielle Clarke MRN: [id number] Date of Birth: 03-19-1941  Today's Date: 02/19/2019 OT Individual Time: 1229-1259 OT Individual Time Calculation (min): 30 min    Short Term Goals: Week 1:  OT Short Term Goal 1 (Week 1): Pt will be able to don a shirt with min A demonstrating improved R shoulder AROM. OT Short Term Goal 2 (Week 1): Pt will be able to don pants over her feet with min A. OT Short Term Goal 3 (Week 1): Pt will be able to self cleanse after toileting with min A. OT Short Term Goal 4 (Week 1): Pt will complete toilet transfer with S recalling safe transfer steps to demonstrate improved cognition.  Skilled Therapeutic Interventions/Progress Updates:    1:1. Pt set up in [facility] elevated with lunch. Pt eventually agreeable to transferring to w/c to finish lunch with significantly increased time during transitional movements. Pt completes stand pivot transfer with MIN A and manual facilitation of weight shift forward and HHA. Pt eats seated in w/c with VC for small bites and sips per SLP instruction. Pt demo 1 cough and consistently clearing throat after sips from cup.  Exited session with pt seated in w/c, exit alarm on and call light in reach and NT supervising finishing rest of meal.  Therapy Documentation Precautions:  Precautions Precautions: Fall, ICD/Pacemaker Restrictions Weight Bearing Restrictions: No LUE Weight Bearing: Non weight bearing General: General OT Amount of Missed Time: 30 Minutes Vital Signs:  Pain:   ADL:   Vision   Perception    Praxis   Exercises:   Other Treatments:     Therapy/Group: Individual Therapy  Shon Hale 02/19/2019, 12:42 PM

## 2019-02-19 NOTE — Consult Note (Signed)
Neuropsychological Consultation   Patient:   Danielle Clarke   DOB:   1941-05-16  MR Number:  [id number]  Location:  [facility] [address] CENTER [address] [id number] [place] [address] Dept: [phone] Loc: [phone]           Date of Service:   02/19/2019  Start Time:   11 AM End Time:   12 PM  Provider/Observer:  Arley Phenix, Psy.D.       Clinical Neuropsychologist       Billing Code/Service: [phone]  Chief Complaint:    Danielle Clarke is a 78 year old female with history of diabetes, hyperlipidemia, hypertension, mild aortic stenosis, CAD, anterior cervical copectomy, recurrent syncopal episodes.  Presented 02/07/2019 after syncopal episode while at dental office.  Cranial CT/MRI showed left frontal intraparenchymal hematoma with volume of approximately 38ml.  50mm left-to right midline shift.  Conservative care.  Patient with residual cognitive changes and deficits with arousal and cognition.    Reason for Service:  Patient was referred for neuropsychological consultation due to coping and residual cognitive changes.  Below is the full HPI for the current admission.  [id number] SARYA ROATH is a 78 year old right-handed female with history of diabetes mellitus maintained on Tresiba 10 mg daily, hyperlipidemia , hypertension, mild aortic stenosis, carotid artery disease maintained on aspirin 81 mg daily, anterior cervical corpectomy, recurrent syncopal episodes followed by cardiology services. Per chart review patient and daughter, patient lives alone. One level home with 4 steps to entry. Perform all ADLs and driving. She does use a cane when needed. Family in the area works. Presented 02/07/2019 after syncopal episode while at the dental office. Daughter reports that she struck her head. No seizure activity noted. Blood sugars greater than 200. Cranial CT/MRI showed left frontal intraparenchymal hematoma  with volume of approximately 10 mL. Posterior right parietal scalp hematoma consistent with contrecoup pattern of injury. No acute fracture. No underlying mass lesion. There was a 4 mm left-to-right midline shift. No hydrocephalus. Neurosurgery Dr. Conchita Paris advise conservative care. Noted blood pressure elevated maintained on nifedipine drip as well as heart rate 100s. Echocardiogram with ejection fraction of 65% normal systolic function. EP follow-up for multiple episodes of intermittent high-grade heart block identified on telemetry followed by cardiology services in the past with troponin negative. 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 Gilman Schmidt. 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 Plan currently is to continue to monitor no plan for endoscopy at this time. No further episodes reported hemoglobin remains stable 11.8. Patient currently a full liquid diet nasogastric tube in place for nutritional supportthat was later discontinued. Close monitoring of blood pressure on Lopressor 25 mg twice a day. Therapy evaluations completed with recommendations of physical medicine rehabilitation consult. Patient was admitted for a comprehensive rehabilitation program.  Current Status:  Today, the patient was very hard to awake and was difficult to orientate.  The patient had difficulty answering basic questions and mental status was poor and the patient was unable to give year, month, details of situation or where she was.    Behavioral Observation: Danielle Clarke  presents as a 78 y.o.-year-old Right Caucasian Female who appeared her stated age. her dress was Appropriate and she was Well Groomed and her manners were Appropriate to the situation.  her participation was indicative of Drowsy  and Inattentive behaviors.  There were any  physical disabilities noted.  she displayed an inappropriate level of cooperation and motivation.     Interactions:    Minimal Drowsy and Inattentive  Attention:   abnormal and attention span appeared shorter than expected for age  Memory:   abnormal; global memory impairment noted  Visuo-spatial:  not examined  Speech (Volume):  low  Speech:   non-fluent aphasia; non-fluent aphasia  Thought Process:  Disorganized  Though Content:  WNL; not suicidal and not homicidal  Orientation:   person  Judgment:   Poor  Planning:   Poor  Affect:    Blunted  Mood:    Dysphoric  Insight:   Lacking  Intelligence:   normal  Medical History:   Past Medical History:  Diagnosis Date  . Aortic stenosis, mild   . Arthritis   . Asthmatic bronchitis    with colds per patient  . Carotid artery disease (HCC)    40-59% bilateral ICA stenosis  . Cataract   . Cutaneous abscess of right foot   . Glaucoma   . Heart murmur    Dr Katrinka Blazing is her  cardiologist.   . Hyperlipemia   . Hypertension    Dr. Katrinka Blazing ~ 2 years ago  . Neck fracture [facility])    july 2013  . Osteoporosis   . Syncope   . Type 1 diabetes mellitus (HCC)    Psychiatric History:  No prior psychiatric history  Family Med/Psych History:  Family History  Problem Relation Age of Onset  . Cancer Mother   . Heart Problems Father    Impression/DX:  acqueline Jaelyne Clarke is a 78 year old female with history of diabetes, hyperlipidemia, hypertension, mild aortic stenosis, CAD, anterior cervical copectomy, recurrent syncopal episodes.  Presented 02/07/2019 after syncopal episode while at dental office.  Cranial CT/MRI showed left frontal intraparenchymal hematoma with volume of approximately 65ml.  57mm left-to right midline shift.  Conservative care.  Patient with residual cognitive changes and deficits with arousal and cognition.   Today, the patient was very hard to awake and was difficult to orientate.  The patient had difficulty  answering basic questions and mental status was poor and the patient was unable to give year, month, details of situation or where she was.          Electronically Signed   _______________________ Arley Phenix, Psy.D.

## 2019-02-19 NOTE — Progress Notes (Signed)
Speech Language Pathology Daily Session Note  Patient Details  Name: Danielle Clarke MRN: [id number] Date of Birth: 03-06-1941  Today's Date: 02/19/2019 SLP Individual Time: 0815-0855 SLP Individual Time Calculation (min): 40 min  Short Term Goals: Week 1: SLP Short Term Goal 1 (Week 1): Patient will participate in divergent naming task identifying 10 items in concrete category. SLP Short Term Goal 2 (Week 1): Pt will sustain her attention to mildly complex tasks for 10 minute intervals with min verbal cues for redirection.   SLP Short Term Goal 3 (Week 1): Pt will complete mildly complex tasks with min assist for functional problem solving.   SLP Short Term Goal 4 (Week 1): Pt will recall basic, daily information with min assist verbal cues for use of external aids.   SLP Short Term Goal 5 (Week 1): Patient will participate in confrontation naming task with 80% accuracy and min verbal cues. SLP Short Term Goal 6 (Week 1): Patient will respond to basic biotraphical -wh questions with 80% accuracy and min verbal cues.  Skilled Therapeutic Interventions: Skilled treatment session focused on cognitive-linguistic goals. Upon arrival, patient appeared brighter and more willing to engage compared to yesterday's session. Patient was incontinent of urine and followed commands for bed mobility with extra time and overall supervision verbal cues. Patient consumed breakfast meal with Min A verbal cues needed for problem solving with set-up but then alternated her attention between self-feeding and basic conversation with Mod I. Patient with non-fluent aphasia but demonstrated increased verbal expression at both the word and phrase level in regards to wants/needs. Patient left upright in bed with alarm on and RN present. Continue with current plan of care.      Pain No/Denies Pain   Therapy/Group: Individual Therapy  Camilah Spillman 02/19/2019, 2:37 PM

## 2019-02-19 NOTE — Progress Notes (Signed)
Physical Therapy Session Note  Patient Details  Name: Danielle Clarke MRN: [id number] Date of Birth: [birth year]  Today's Date: 02/19/2019 PT Individual Time: 8097-0449 PT Individual Time Calculation (min): 70 min   Short Term Goals: Week 1:  PT Short Term Goal 1 (Week 1): Pt will participate in OOB activity w/o increase in pain PT Short Term Goal 2 (Week 1): Pt will ambulate 50' w/ LRAD w/ min assist PT Short Term Goal 3 (Week 1): Pt will initiate stair training  PT Short Term Goal 4 (Week 1): Pt will follow simple 2-step commands 50% of the time PT Short Term Goal 5 (Week 1): Pt will initiate functional mobility w/ min cues for initiation 100% of the time  Skilled Therapeutic Interventions/Progress Updates:   Pt received sitting in WC and agreeable to PT. Pt reports having incontinent bowel and bladder movement. Stand pivot transfer to toilet with mod assist. Pt verbalizing pain, but unable to locate. Sit<>stand from toilet with mod assist for PT to manage clothing. Pt noted to have additional bladder movement. Pt able to partially perform pericare. Sit<>stand x 2 with mod assist for PT to complete and don clean pants. Stand pivot back to WC. Sit<>stand at sink with min assist to perform hand hygiene; additional sit<>stand from [employer] with mod assist and max cues for initiation. Pt also performed gait training in room 2 x 36f with min assist and max encouragement. Prolonged rest breaks provided following each activity. Pt  performed stand pivot transfer to bed with mod assist and RW. Sit>supine completed with min assist, and left supine in bed with call bell in reach and all needs met.        Therapy Documentation Precautions:  Precautions Precautions: Fall, ICD/Pacemaker Restrictions Weight Bearing Restrictions: No LUE Weight Bearing: Non weight bearing   Vital Signs: Therapy Vitals Temp: 98.4 F (36.9 C) Temp Source: Oral Pulse Rate: 73 Resp: 16 BP: (!) 114/42 Patient  Position (if appropriate): Lying Oxygen Therapy SpO2: 96 % O2 Device: Room Air Pain: Pain Assessment Pain Scale: 0-10 Pain Score: 0-No pain    Therapy/Group: Individual Therapy  ALorie Phenix3/25/2020, 11:23 PM

## 2019-02-19 NOTE — Progress Notes (Signed)
CBG 469 

## 2019-02-19 NOTE — Progress Notes (Signed)
Occupational Therapy Session Note  Patient Details  Name: Danielle Clarke MRN: [id number] Date of Birth: 07-Feb-1941  Today's Date: 02/19/2019 OT Individual Time: 0920-1005 OT Individual Time Calculation (min): 45 min    Short Term Goals: Week 1:  OT Short Term Goal 1 (Week 1): Pt will be able to don a shirt with min A demonstrating improved R shoulder AROM. OT Short Term Goal 2 (Week 1): Pt will be able to don pants over her feet with min A. OT Short Term Goal 3 (Week 1): Pt will be able to self cleanse after toileting with min A. OT Short Term Goal 4 (Week 1): Pt will complete toilet transfer with S recalling safe transfer steps to demonstrate improved cognition.  Skilled Therapeutic Interventions/Progress Updates:    1:1. Pt completes supine>sitting with CGA. Pt requires coaxing OOB to brush teeth after meal. Pt does not rate pain but exclaims, "Oh!" with mobility. Pt declines medication just stating, "give me a minute." Pt stand pivot transfer with MIN A. Pt requires VC for pacemaker precautions and NWB status of LUE. Pt dons shirt with min A to pull down back and pants with MAX A for threading RLE and advancing pants past hips. Pt completes oral care seated in w/c for energy conservation. Pt requesting to return to bed. Pt makes "pact" with OT to get OOB for later session in after noon and prints and signs her name as "proof" of agreement. Exited session with pt seated in w/c, call light in reach and all need smet  Therapy Documentation Precautions:  Precautions Precautions: Fall, ICD/Pacemaker Restrictions Weight Bearing Restrictions: No LUE Weight Bearing: Non weight bearing General: General OT Amount of Missed Time: 30 Minutes   Therapy/Group: Individual Therapy  Shon Hale 02/19/2019, 10:10 AM

## 2019-02-19 NOTE — Progress Notes (Signed)
[place] PHYSICAL MEDICINE & REHABILITATION PROGRESS NOTE  Subjective/Complaints: Patient seen lying in bed this morning.  She states she slept well overnight.  Discussed lack of participation with therapies yesterday.  Discussed with patient this morning need to participate in therapies for improvement patient states "oh well".  ROS: ? Reliability, but denies CP, shortness of breath, nausea, vomiting, diarrhea.  Objective: Vital Signs: Blood pressure (!) 148/42, pulse 75, temperature 99.4 F (37.4 C), temperature source Oral, resp. rate 18, height 5' (1.524 m), weight 51.6 kg, SpO2 96 %. No results found. Recent Labs    02/16/19 1149 02/17/19 0618  WBC 8.5 10.2  HGB 10.6* 10.7*  HCT 31.5* 33.3*  PLT 173 223   Recent Labs    02/16/19 1149 02/17/19 0618  NA 135 136  K 4.6 4.2  CL 97* 99  CO2 29 27  GLUCOSE 218* 158*  BUN 18 13  CREATININE 0.76 0.70  CALCIUM 8.3* 8.4*    Physical Exam: BP (!) 148/42 (BP Location: Right Arm)   Pulse 75   Temp 99.4 F (37.4 C) (Oral)   Resp 18   Ht 5' (1.524 m)   Wt 51.6 kg   SpO2 96%   BMI 22.22 kg/m  Constitutional: No distress . Vital signs reviewed. HENT: Normocephalic.  Atraumatic. Eyes: EOMI. No discharge. Cardiovascular: RRR.  No JVD.  + Murmur Respiratory: CTA bilaterally.  Normal effort. GI: BS +. Non-distended. Musc: No edema or tenderness in extremities. Neurological:Alert and oriented x2 Slow to process.  Motor: Grossly 4/5 throughout, unchanged Skin: Skin iswarmand dry.  Psychiatric:Confused.  Assessment/Plan: 1. Functional deficits secondary to traumatic left frontal intraparenchymal hematoma which require 3+ hours per day of interdisciplinary therapy in a comprehensive inpatient rehab setting.  Physiatrist is providing close team supervision and 24 hour management of active medical problems listed below.  Physiatrist and rehab team continue to assess barriers to discharge/monitor patient progress  toward functional and medical goals  Care Tool:  Bathing    Body parts bathed by patient: Right arm, Left arm, Chest, Abdomen, Face, Right upper leg, Left upper leg   Body parts bathed by helper: Buttocks     Bathing assist Assist Level: Moderate Assistance - Patient 50 - 74%     Upper Body Dressing/Undressing Upper body dressing   What is the patient wearing?: Pull over shirt    Upper body assist Assist Level: Minimal Assistance - Patient > 75%    Lower Body Dressing/Undressing Lower body dressing      What is the patient wearing?: Underwear/pull up, Incontinence brief     Lower body assist Assist for lower body dressing: Moderate Assistance - Patient 50 - 74%     Toileting Toileting    Toileting assist Assist for toileting: Moderate Assistance - Patient 50 - 74%     Transfers Chair/bed transfer  Transfers assist     Chair/bed transfer assist level: Moderate Assistance - Patient 50 - 74%     Locomotion Ambulation   Ambulation assist      Assist level: Minimal Assistance - Patient > 75% Assistive device: Walker-rolling Max distance: 15'   Walk 10 feet activity   Assist     Assist level: Moderate Assistance - Patient - 50 - 74% Assistive device: Hand held assist   Walk 50 feet activity   Assist Walk 50 feet with 2 turns activity did not occur: Safety/medical concerns         Walk 150 feet activity   Assist Walk  150 feet activity did not occur: Safety/medical concerns         Walk 10 feet on uneven surface  activity   Assist Walk 10 feet on uneven surfaces activity did not occur: Safety/medical concerns         Wheelchair     Assist Will patient use wheelchair at discharge?: (TBD) Type of Wheelchair: Manual    Wheelchair assist level: Supervision/Verbal cueing Max wheelchair distance: 100'    Wheelchair 50 feet with 2 turns activity    Assist        Assist Level: Supervision/Verbal cueing   Wheelchair  150 feet activity     Assist Wheelchair 150 feet activity did not occur: Safety/medical concerns          Medical Problem List and Plan: 1.Decreased functional mobilitysecondary to traumatic left frontal intraparenchymal hematoma  Continue CIR  Team conference today to discuss current and goals and coordination of care, home and environmental barriers, and discharge planning with nursing, case manager, and therapies.  2. Antithrombotics: -DVT/anticoagulation:SCDs -antiplatelet therapy: None secondary to intraparenchymal hematoma 3. Pain Management:Lidoderm patch 4. Mood:Provide emotional support -antipsychotic agents: none 5. Neuropsych: This patientis not fully capable of making decisions on herown behalf. 6. Skin/Wound Care:Routine skin checks 7. Fluids/Electrolytes/Nutrition:Routine in and out's 8. 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 Follow-up per EP  9. Moderate severe aortic stenosis. Continue Lopressor 25 mg twice a day -follow for tolerance of physical activity with therapies 10. Dysphagia. Currently on a clear liquid diet.   Advance diet as tolerated, will consider advancement after discussion with daughter, will follow-up  D/c IVF, improving fluid intake 11. Intermittent coffee-ground emesis. 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 Continue PPI.  Follow-up gastroenterology services currently with conservative care.  Hemoglobin 10.7 on 3/23  Labs pending  Continue to monitor 12. Diabetes mellitus  Patient had been on Tresiba 10 units daily prior to admission.  Lantus insulin10 units twice daily, decreased to daily on 3/24.  Check blood sugars before meals and at bedtime.   Remains labile on 3/25 13. Hypoalbuminemia  Supplement initiated on 3/23 14.  Right pleural effusion  Continue to monitor 15.  Rib fractures  Right 6-8  Conservative care 16.  Low-grade  temperature  Persistent on 3/25  CBC pending  LOS: 5 days A FACE TO FACE EVALUATION WAS PERFORMED  Thoms Barthelemy Karis Juba 02/19/2019, 8:08 AM

## 2019-02-19 NOTE — Progress Notes (Signed)
CBG greater than 400, notified PA, new orders to recheck CBG and one time dose of regular insulin 12 units due to poor intake.

## 2019-02-20 ENCOUNTER — Inpatient Hospital Stay (HOSPITAL_COMMUNITY): Payer: Medicare Other | Admitting: Occupational Therapy

## 2019-02-20 ENCOUNTER — Inpatient Hospital Stay (HOSPITAL_COMMUNITY): Payer: Medicare Other | Admitting: Physical Therapy

## 2019-02-20 ENCOUNTER — Inpatient Hospital Stay (HOSPITAL_COMMUNITY): Payer: Medicare Other | Admitting: Speech Pathology

## 2019-02-20 ENCOUNTER — Ambulatory Visit: Payer: Medicare Other

## 2019-02-20 DIAGNOSIS — S06350S Traumatic hemorrhage of left cerebrum without loss of consciousness, sequela: Secondary | ICD-10-CM

## 2019-02-20 DIAGNOSIS — K2951 Unspecified chronic gastritis with bleeding: Secondary | ICD-10-CM

## 2019-02-20 DIAGNOSIS — D72829 Elevated white blood cell count, unspecified: Secondary | ICD-10-CM

## 2019-02-20 DIAGNOSIS — R5383 Other fatigue: Secondary | ICD-10-CM

## 2019-02-20 DIAGNOSIS — R0989 Other specified symptoms and signs involving the circulatory and respiratory systems: Secondary | ICD-10-CM

## 2019-02-20 LAB — URINE CULTURE

## 2019-02-20 LAB — GLUCOSE, CAPILLARY
Glucose-Capillary: 112 mg/dL — ABNORMAL HIGH (ref 70–99)
Glucose-Capillary: 255 mg/dL — ABNORMAL HIGH (ref 70–99)
Glucose-Capillary: 256 mg/dL — ABNORMAL HIGH (ref 70–99)
Glucose-Capillary: 262 mg/dL — ABNORMAL HIGH (ref 70–99)
Glucose-Capillary: 267 mg/dL — ABNORMAL HIGH (ref 70–99)
Glucose-Capillary: 346 mg/dL — ABNORMAL HIGH (ref 70–99)

## 2019-02-20 MED ORDER — NITROFURANTOIN MONOHYD MACRO 100 MG PO CAPS
100.0000 mg | ORAL_CAPSULE | Freq: Two times a day (BID) | ORAL | Status: DC
  Administered 2019-02-20 – 2019-02-22 (×5): 100 mg via ORAL
  Filled 2019-02-20 (×5): qty 1

## 2019-02-20 NOTE — Progress Notes (Signed)
Occupational Therapy Session Note  Patient Details  Name: Danielle Clarke MRN: [id number] Date of Birth: February 03, 1941  Today's Date: 02/20/2019  Session 1 OT Individual Time: 9379-0240 OT Individual Time Calculation (min): 55 min   Session 2  OT Missed time: 45 mins  Short Term Goals: Week 1:  OT Short Term Goal 1 (Week 1): Pt will be able to don a shirt with min A demonstrating improved R shoulder AROM. OT Short Term Goal 2 (Week 1): Pt will be able to don pants over her feet with min A. OT Short Term Goal 3 (Week 1): Pt will be able to self cleanse after toileting with min A. OT Short Term Goal 4 (Week 1): Pt will complete toilet transfer with S recalling safe transfer steps to demonstrate improved cognition.  Skilled Therapeutic Interventions/Progress Updates:  Session 1   Pt greeted asleep in bed, easy to wake, but slow to initiate getting OOB. Mod a to transition to sitting. When asked if pt needed to go to the bathroom she stated "yes." Stand-pivot bed>wc>toilet>wc with mod A. Pt needed assistance for clothing management and had already had incontinent bladder episode. Pt able to void more bladder and BM in commode. Assistance for peri-care. Plan for pt to then transition into the shower. Pt refused to shower or bathe at all stating she did not want to and was too tired. OT encouraged pt to try to participate in self-care but she continued to refuse. Pt agreeable to shower upon OT return later this morning. Self-feeding completed with pt as she was able to manipulate feeding utensils and take appropriate sized bites. Pt with intermittent coughing noted, but able to clear her throat. Pt left seated in wc with safety belt on and needs met. Handoff to nurse tech to finish eating.   Session 2 Attempted to see pt for second OT treatment session. Pt asleep in bed and difficult to arouse despite environmental changes and multiple tactilce cues. Pt left supine in bed.   Therapy  Documentation Precautions:  Precautions Precautions: Fall, ICD/Pacemaker Restrictions Weight Bearing Restrictions: No LUE Weight Bearing: Non weight bearing General: General OT Amount of Missed Time: 45 Minutes Pain: Pain Assessment Pain Scale: 0-10 Pain Score: 0-No pain Faces Pain Scale: Hurts a little bit Pain Type: Acute pain Pain Location: Rib cage Pain Orientation: Right Pain Descriptors / Indicators: Aching Pain Onset: With Activity Patients Stated Pain Goal: 0 Pain Intervention(s):;Repositioned(tylenol) Multiple Pain Sites: No   Therapy/Group: Individual Therapy  Valma Cava 02/20/2019, 11:51 AM

## 2019-02-20 NOTE — Progress Notes (Signed)
Danielle Clarke PHYSICAL MEDICINE & REHABILITATION PROGRESS NOTE  Subjective/Complaints: Patient seen laying in bed this morning.  She states she slept well overnight.  She offers limited engagement.  Lethargy discussed incontinence yesterday.  ROS: ?  Reliability, but denies CP, shortness of breath, nausea, vomiting, diarrhea.  Objective: Vital Signs: Blood pressure (!) 166/53, pulse 86, temperature 98.4 F (36.9 C), temperature source Oral, resp. rate 16, height 5' (1.524 m), weight 51.6 kg, SpO2 94 %. No results found. Recent Labs    02/19/19 1453  WBC 12.2*  HGB 9.8*  HCT 30.1*  PLT 284   No results for input(s): NA, K, CL, CO2, GLUCOSE, BUN, CREATININE, CALCIUM in the last 72 hours.  Physical Exam: BP (!) 166/53 (BP Location: Left Arm)   Pulse 86   Temp 98.4 F (36.9 C) (Oral)   Resp 16   Ht 5' (1.524 m)   Wt 51.6 kg   SpO2 94%   BMI 22.22 kg/m  Constitutional: No distress . Vital signs reviewed. HENT: Normocephalic.  Atraumatic. Eyes: EOMI. No discharge. Cardiovascular: RRR.  No JVD.  + Murmur Respiratory: CTA bilaterally.  Normal effort. GI: BS +. Non-distended. Musc: No edema or tenderness in extremities. Neurological:Somnolent Slow to process.  Motor: Grossly 4/5 throughout, stable Skin: Skin iswarmand dry.  Psychiatric:Confused.  Assessment/Plan: 1. Functional deficits secondary to traumatic left frontal intraparenchymal hematoma which require 3+ hours per day of interdisciplinary therapy in a comprehensive inpatient rehab setting.  Physiatrist is providing close team supervision and 24 hour management of active medical problems listed below.  Physiatrist and rehab team continue to assess barriers to discharge/monitor patient progress toward functional and medical goals  Care Tool:  Bathing    Body parts bathed by patient: Right arm, Left arm, Chest, Abdomen, Face, Right upper leg, Left upper leg   Body parts bathed by helper: Buttocks      Bathing assist Assist Level: Moderate Assistance - Patient 50 - 74%     Upper Body Dressing/Undressing Upper body dressing   What is the patient wearing?: Pull over shirt    Upper body assist Assist Level: Minimal Assistance - Patient > 75%    Lower Body Dressing/Undressing Lower body dressing      What is the patient wearing?: Underwear/pull up, Incontinence brief     Lower body assist Assist for lower body dressing: Moderate Assistance - Patient 50 - 74%     Toileting Toileting    Toileting assist Assist for toileting: Moderate Assistance - Patient 50 - 74%     Transfers Chair/bed transfer  Transfers assist     Chair/bed transfer assist level: Moderate Assistance - Patient 50 - 74%     Locomotion Ambulation   Ambulation assist      Assist level: Minimal Assistance - Patient > 75% Assistive device: Walker-rolling Max distance: 15'   Walk 10 feet activity   Assist     Assist level: Moderate Assistance - Patient - 50 - 74% Assistive device: Hand held assist   Walk 50 feet activity   Assist Walk 50 feet with 2 turns activity did not occur: Safety/medical concerns         Walk 150 feet activity   Assist Walk 150 feet activity did not occur: Safety/medical concerns         Walk 10 feet on uneven surface  activity   Assist Walk 10 feet on uneven surfaces activity did not occur: Safety/medical concerns         Wheelchair  Assist Will patient use wheelchair at discharge?: (TBD) Type of Wheelchair: Manual    Wheelchair assist level: Supervision/Verbal cueing Max wheelchair distance: 100'    Wheelchair 50 feet with 2 turns activity    Assist        Assist Level: Supervision/Verbal cueing   Wheelchair 150 feet activity     Assist Wheelchair 150 feet activity did not occur: Safety/medical concerns          Medical Problem List and Plan: 1.Decreased functional mobilitysecondary to traumatic left  frontal intraparenchymal hematoma  Continue CIR 2. Antithrombotics: -DVT/anticoagulation:SCDs -antiplatelet therapy: None secondary to intraparenchymal hematoma 3. Pain Management:Lidoderm patch 4. Mood:Provide emotional support -antipsychotic agents: none 5. Neuropsych: This patientis not fully capable of making decisions on herown behalf. 6. Skin/Wound Care:Routine skin checks 7. Fluids/Electrolytes/Nutrition:Routine in and out's 8. 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 Follow-up per EP  9. Moderate severe aortic stenosis. Continue Lopressor 25 mg twice a day -follow for tolerance of physical activity with therapies 10. Dysphagia. Currently on a clear liquid diet.   Advance diet as tolerated  D/c IVF, improving fluid intake 11. Intermittent coffee-ground emesis. 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 Continue PPI.  Follow-up gastroenterology services currently with conservative care.  Hemoglobin 9.8 on 3/25  Continue to monitor 12. Diabetes mellitus  Patient had been on Tresiba 10 units daily prior to admission.  Lantus insulin10 units twice daily, decreased to daily on 3/24.  Check blood sugars before meals and at bedtime.   Remains labile on 3/26 13. Hypoalbuminemia  Supplement initiated on 3/23 14.  Right pleural effusion  Continue to monitor 15.  Rib fractures  Right 6-8  Conservative care 16.  Leukocytosis  WBCs 12.2 on 3/25  Low-grade fever intermittent  UA equivocal, urine culture pending  Empiric Macrobid started on 3/26 17.  Labile blood pressure  Labile on 3/26 18.  Lethargy  Multifactorial- TBI +/- UTI  See #16  Will consider repeat CT if decline in status  LOS: 6 days A FACE TO FACE EVALUATION WAS PERFORMED  Danielle Clarke 02/20/2019, 8:16 AM

## 2019-02-20 NOTE — Care Management (Signed)
[facility] Individual Statement of Services  Patient Name:  Danielle Clarke  Date:  02/20/2019  Welcome to the [facility].  Our goal is to provide you with an individualized program based on your diagnosis and situation, designed to meet your specific needs.  With this comprehensive rehabilitation program, you will be expected to participate in at least 3 hours of rehabilitation therapies Monday-Friday, with modified therapy programming on the weekends.  Your rehabilitation program will include the following services:  Physical Therapy (PT), Occupational Therapy (OT), Speech Therapy (ST), 24 hour per day rehabilitation nursing, Therapeutic Recreaction (TR), Neuropsychology, Case Management (Social Worker), Rehabilitation Medicine, Nutrition Services and Pharmacy Services  Weekly team conferences will be held on Wednesdays to discuss your progress.  Your Social Worker will talk with you frequently to get your input and to update you on team discussions.  Team conferences with you and your family in attendance may also be held.  Expected length of stay: 10-14 days   Overall anticipated outcome: supervision  Depending on your progress and recovery, your program may change. Your Social Worker will coordinate services and will keep you informed of any changes. Your Social Worker's name and contact numbers are listed  below.  The following services may also be recommended but are not provided by the [facility]patient [facility]:   Driving Evaluations  Home Health Rehabiltiation Services  Outpatient Rehabilitation Services   Arrangements will be made to provide these services after discharge if needed.  Arrangements include referral to agencies that provide these services.  Your insurance has been verified to be:  [facility] Medicare Your primary doctor is:  Juluis Rainier  Pertinent information will be shared with your doctor and your insurance  company.  Social Worker:  [place], [place] [phone] or ([phone]   Information discussed with and copy given to patient by: Amada Jupiter, 02/20/2019, 3:18 PM

## 2019-02-20 NOTE — Progress Notes (Signed)
Physical Therapy Session Note  Patient Details  Name: Danielle Clarke MRN: [id number] Date of Birth: Apr 25, 1941  Today's Date: 02/20/2019 PT Individual Time: 1515-1625 PT Individual Time Calculation (min): 70 min (10 min of make-up time)  Short Term Goals: Week 1:  PT Short Term Goal 1 (Week 1): Pt will participate in OOB activity w/o increase in pain PT Short Term Goal 2 (Week 1): Pt will ambulate 50' w/ LRAD w/ min assist PT Short Term Goal 3 (Week 1): Pt will initiate stair training  PT Short Term Goal 4 (Week 1): Pt will follow simple 2-step commands 50% of the time PT Short Term Goal 5 (Week 1): Pt will initiate functional mobility w/ min cues for initiation 100% of the time  Skilled Therapeutic Interventions/Progress Updates:   Pt in supine and agreeable to therapy w/ encouragement, no c/o pain at rest but yells out in pain w/ transitional movements, pain in R rib site however does not rate. Min assist supine>sit, donned shirt w/ supervision and pants w/ max assist. Pt reporting needing to toilet, ambulated to/from toilet w/ CGA using RW and verbal cues for safety. Min assist for LE garment management and pericare, continent void and BM. Pt required max encouragement after toileting to continue w/ session, ongoing education on importance of getting stronger prior to going home. Total assist w/c transport to/from gym for energy conservation. Worked on functional endurance and global strengthening. NuStep 5 min @ level 3 and ambulated 10' w/ RW, CGA. Pt adamantly refused any further standing activity 2/2 fatigue despite encouragement. Agreeable to seated activity in w/c. Worked on cognitive remediation while performing peg board task, card matching task, and pipe tree. Emphasis on problem solving, attention to task, and initiation. Min verbal cues throughout. Returned to room and ended session in supine, all needs in reach.  Therapy Documentation Precautions:  Precautions Precautions:  Fall, ICD/Pacemaker Restrictions Weight Bearing Restrictions: No LUE Weight Bearing: Non weight bearing Vital Signs: Therapy Vitals Temp: 98.5 F (36.9 C) Temp Source: Oral Pulse Rate: 81 Resp: 19 BP: (!) 114/33 Patient Position (if appropriate): Sitting Oxygen Therapy SpO2: 98 % O2 Device: Room Air Pain:    Therapy/Group: Individual Therapy  [name] 02/20/2019, 4:29 PM

## 2019-02-20 NOTE — Patient Care Conference (Signed)
Inpatient RehabilitationTeam Conference and Plan of Care Update Date: 02/19/2019   Time: 2:40 PM    Patient Name: Danielle Clarke      Medical Record Number: [id number]  Date of Birth: 04-18-1941 Sex: Female         Room/Bed: 4M02C/4M02C-01 Payor Info: Payor: [employer] MEDICARE / Plan: [employer] MEDICARE / Product Type: *No Product type* /    Admitting Diagnosis: hemorrhage and hematoma  Admit Date/Time:  02/14/2019  2:40 PM Admission Comments: No comment available   Primary Diagnosis:  <principal problem not specified> Principal Problem: <principal problem not specified>  Patient Active Problem List   Diagnosis Date Noted  . Leukocytosis   . Lethargy   . Labile blood pressure   . 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 Acute blood loss anemia   . Low grade fever   . Hypoglycemia   . Multiple closed fractures of ribs of right side   . Pleural effusion on right   . Hypoalbuminemia due to protein-calorie malnutrition (HCC)   . Dysphagia   . Traumatic cerebral intraparenchymal hematoma (HCC) 02/14/2019  . Pacemaker   . Labile blood glucose   . Diabetes mellitus type 2 in nonobese (HCC)   . Dyslipidemia   . Essential hypertension   . Hypernatremia   . Anemia of chronic disease   . Hypertensive urgency 02/07/2019  . Intracerebral hemorrhage (HCC) 02/07/2019  . Intracranial bleeding (HCC) 02/06/2019  . Elevated troponin   . Abnormal EKG   . Bronchitis 08/05/2017  . Bilateral carotid artery stenosis   . Aortic stenosis 08/25/2015  . Cervical stenosis of spinal canal 07/31/2014  . CKD (chronic kidney disease) stage 2, GFR 60-89 ml/min 12/30/2013  . Essential hypertension, benign 12/30/2013  . Hyperlipidemia 12/30/2013  . Toe infection 12/30/2013  . Anemia 12/30/2013  . Acute renal failure (HCC) 12/29/2013  . Syncope 01/02/2013  . Diabetes mellitus type 1 (HCC) 01/02/2013  . Bilateral carotid artery disease (HCC) 01/02/2013  . GOITER,  MULTINODULAR 02/11/2008    Expected Discharge Date: Expected Discharge Date: 02/28/19  Team Members Present: Physician leading conference: Dr. Maryla Morrow Social Worker Present: Amada Jupiter, LCSW Nurse Present: Adora Fridge, RN PT Present: Carlynn Purl, PT OT Present: Kearney Hard, OT SLP Present: Feliberto Gottron, SLP PPS Coordinator present : Fae Pippin     Current Status/Progress Goal Weekly Team Focus  Medical   Decreased functional mobilitysecondary to traumatic left frontal intraparenchymal hematoma  Improve mobility, dysphagia, CBGs, pain  See above   Bowel/Bladder   Pt is incontinent B/B. LBM 02/18/2019.  Maintain regular bowel pattern. Encourage timed toileting.  Assist with toileting needs PRN.   Swallow/Nutrition/ Hydration             ADL's   Supervision for UB bathing with min assist for UB dressing.  Mod assist for LB bathing and dressing sit to stand.  Mod assist for stand pivot transfers to bed and toilet.  Decreased endurance with decreased motivation to participate  supervision level overall  selfcare retraining, transfer training, balance retraining,    Mobility   min assist overall, short distance gait w/ RW, limited by R rib pain and fatigue/lethargy  supervision household gait   endurance, OOB tolerance, all functional mobility, cognitive remediation   Communication   Mod-MaxA   Min A   word-finding at the phrase level to express wants/needs    Safety/Cognition/ Behavioral Observations  Mod-Max A  Supervision  recall, problem solving, awareness    Pain  Faces scale used to interpret pain of 2 to R rib cage. Pain managed with Tylenol.  Pain < 2  Assess pain Q shift and PRN.   Skin   Pt has MASD to the groin and peri area.   Treat skin issues per orders.  Assess skin Q shift and PRN.    Rehab Goals Patient on target to meet rehab goals: Yes *See Care Plan and progress notes for long and short-term goals.     Barriers to Discharge  Current  Status/Progress Possible Resolutions Date Resolved   Physician    Medical stability     See above  Therapies, follow labs, follow up on advancing diet, optimize pain meds      Nursing                  PT  Other (comments)  cognitive impairments, lethargy/fatigue              OT                  SLP                SW                Discharge Planning/Teaching Needs:  plan to d/c home with daughter/ family to provide 24/7 supervision or assist  Teaching needs TBD   Team Discussion:  H/o intermittent emesis - monitor labs.  Low grade temp.  A&O x 2 only.  Very fatigued/ lethargic - plan to recheck UA.  Min - mod assist overall with therapies and goals set for supervision.    Revisions to Treatment Plan:  NA    Continued Need for Acute Rehabilitation Level of Care: The patient requires daily medical management by a physician with specialized training in physical medicine and rehabilitation for the following conditions: Daily direction of a multidisciplinary physical rehabilitation program to ensure safe treatment while eliciting the highest outcome that is of practical value to the patient.: Yes Daily medical management of patient stability for increased activity during participation in an intensive rehabilitation regime.: Yes Daily analysis of laboratory values and/or radiology reports with any subsequent need for medication adjustment of medical intervention for : Neurological problems;Diabetes problems;Other   I attest that I was present, lead the team conference, and concur with the assessment and plan of the team.   Mckala Pantaleon 02/20/2019, 11:26 AM

## 2019-02-20 NOTE — Progress Notes (Signed)
Social Work Patient ID: Danielle Clarke, female   DOB: Jul 29, 1941, 78 y.o.   MRN: [id number]   Have reviewed team conference info with pt and daughter.  Pt continues to be somewhat confused and appears very fatigued.  She did not engage much when I discussed goals and target date.   Daughter aware and agreeable with goals of supervision and target d/c date of 4/3.  She plans to take a 3 month LOA from work which can be extended if she needs to.  She will be primary caregiver for pt.   Will continue to follow for support and d/c planning needs.  Danielle Shatz, LCSW

## 2019-02-20 NOTE — Progress Notes (Signed)
Speech Language Pathology Daily Session Note  Patient Details  Name: Danielle Clarke MRN: [id number] Date of Birth: 09/03/1941  Today's Date: 02/20/2019 SLP Individual Time: 9476-5465 SLP Individual Time Calculation (min): 55 min  Short Term Goals: Week 1: SLP Short Term Goal 1 (Week 1): Patient will participate in divergent naming task identifying 10 items in concrete category. SLP Short Term Goal 2 (Week 1): Pt will sustain her attention to mildly complex tasks for 10 minute intervals with min verbal cues for redirection.   SLP Short Term Goal 3 (Week 1): Pt will complete mildly complex tasks with min assist for functional problem solving.   SLP Short Term Goal 4 (Week 1): Pt will recall basic, daily information with min assist verbal cues for use of external aids.   SLP Short Term Goal 5 (Week 1): Patient will participate in confrontation naming task with 80% accuracy and min verbal cues. SLP Short Term Goal 6 (Week 1): Patient will respond to basic biotraphical -wh questions with 80% accuracy and min verbal cues.  Skilled Therapeutic Interventions:  Pt was seen for skilled ST targeting cognitive and dysphagia goals.  Pt was received upright in wheelchair upon therapist's arrival.  Per reporting therapist's discussion with MD during team conference yesterday and from reports in MD's daily progress notes, pt is okay to advance diet as tolerated.  Therefore, swallowing goals were reinitiated.  SLP facilitated the session with trials of dys 2 textures and thin liquids to continue working towards diet progression.  Pt consumed advanced solids with no overt s/s of aspiration with solids or liquids.  SLP also facilitated the session with a novel card game to address problem solving and attention goals; however, pt required total assist to attend to task due to slowed processing.  As a result, task was abandoned.  SLP provided pt with a basic number sorting task on a large print calendar.  Pt  attended to task for its duration (~10 min) with min faded to supervision cues for redirection.  She was also able to place cards in sequential order with supervision cues.  Pt needed max cues for recall of reason for current hospitalization due to repeatedly stating "I don't know."  Pt was returned to room and transferred back to bed.  Left in bed with bed alarm set and call bell within reach.  Continue per current plan of care.    Pain Pain Assessment Pain Scale: 0-10 Pain Score: 0-No pain   Therapy/Group: Individual Therapy  Danielle Clarke, Danielle Clarke 02/20/2019, 11:48 AM

## 2019-02-20 NOTE — Plan of Care (Signed)
  Problem: Consults Goal: RH STROKE PATIENT EDUCATION Description See Patient Education module for education specifics  Outcome: Progressing   Problem: RH BLADDER ELIMINATION Goal: RH STG MANAGE BLADDER WITH ASSISTANCE Description STG Manage Bladder With min Assistance  Outcome: Progressing   Problem: RH SKIN INTEGRITY Goal: RH STG MAINTAIN SKIN INTEGRITY WITH ASSISTANCE Description STG Maintain Skin Integrity With min Assistance.  Outcome: Progressing Goal: RH STG ABLE TO PERFORM INCISION/WOUND CARE W/ASSISTANCE Description STG Able To Perform Incision/Wound Care With min Assistance.  Outcome: Progressing   Problem: RH SAFETY Goal: RH STG ADHERE TO SAFETY PRECAUTIONS W/ASSISTANCE/DEVICE Description STG Adhere to Safety Precautions With cues/reminders Assistance/Device.  Outcome: Progressing   Problem: RH PAIN MANAGEMENT Goal: RH STG PAIN MANAGED AT OR BELOW [name] PAIN GOAL Description At or below level 4  Outcome: Progressing   Problem: RH KNOWLEDGE DEFICIT Goal: RH STG INCREASE KNOWLEDGE OF HYPERTENSION Description Pt will be able to state understanding of management of hypertension using resources/handouts with reminders/cues  Outcome: Progressing Goal: RH STG INCREASE KNOWLEDGE OF DYSPHAGIA/FLUID INTAKE Description Able to manage dysphagia and fluid restrictions with cues/reminders  Outcome: Progressing Goal: RH STG INCREASE KNOWLEDGE OF STROKE PROPHYLAXIS Description Manage cardiac issues and prevention of secondary ICH using resources with cues/reminders  Outcome: Progressing Goal: RH STG INCREASE KNOWLEDGE OF DIABETES Description Pt will be able to state understanding of management of diabetes using resources/handouts with reminders/cues   Outcome: Progressing Goal: RH STG INCREASE KNOWLEGDE OF HYPERLIPIDEMIA Description Pt will be able to state understanding of management of hyperlipidemia using resources/handouts with reminders/cues   Outcome:  Progressing   

## 2019-02-21 ENCOUNTER — Inpatient Hospital Stay (HOSPITAL_COMMUNITY): Payer: Medicare Other | Admitting: Speech Pathology

## 2019-02-21 ENCOUNTER — Inpatient Hospital Stay (HOSPITAL_COMMUNITY): Payer: Medicare Other | Admitting: Occupational Therapy

## 2019-02-21 ENCOUNTER — Inpatient Hospital Stay (HOSPITAL_COMMUNITY): Payer: Medicare Other | Admitting: Physical Therapy

## 2019-02-21 LAB — GLUCOSE, CAPILLARY
Glucose-Capillary: 117 mg/dL — ABNORMAL HIGH (ref 70–99)
Glucose-Capillary: 121 mg/dL — ABNORMAL HIGH (ref 70–99)
Glucose-Capillary: 147 mg/dL — ABNORMAL HIGH (ref 70–99)
Glucose-Capillary: 228 mg/dL — ABNORMAL HIGH (ref 70–99)
Glucose-Capillary: 235 mg/dL — ABNORMAL HIGH (ref 70–99)
Glucose-Capillary: 322 mg/dL — ABNORMAL HIGH (ref 70–99)
Glucose-Capillary: 384 mg/dL — ABNORMAL HIGH (ref 70–99)

## 2019-02-21 NOTE — Progress Notes (Signed)
PHYSICAL MEDICINE & REHABILITATION PROGRESS NOTE  Subjective/Complaints: Patient seen lying in bed this morning.  She states she slept well overnight.  No reported issues.  She denies complaints.  ROS: ?  Reliability, but denies CP, shortness of breath, nausea, vomiting, diarrhea.  Objective: Vital Signs: Blood pressure (!) 165/54, pulse 82, temperature 99.3 F (37.4 C), temperature source Oral, resp. rate 18, height 5' (1.524 m), weight 51.6 kg, SpO2 97 %. No results found. Recent Labs    02/19/19 1453  WBC 12.2*  HGB 9.8*  HCT 30.1*  PLT 284   No results for input(s): NA, K, CL, CO2, GLUCOSE, BUN, CREATININE, CALCIUM in the last 72 hours.  Physical Exam: BP (!) 165/54 (BP Location: Right Arm)   Pulse 82   Temp 99.3 F (37.4 C) (Oral)   Resp 18   Ht 5' (1.524 m)   Wt 51.6 kg   SpO2 97%   BMI 22.22 kg/m  Constitutional: No distress . Vital signs reviewed. HENT: Normocephalic.  Atraumatic. Eyes: EOMI. No discharge. Cardiovascular: RRR.  No JVD.  + Murmur Respiratory: CTA bilaterally.  Normal effort. GI: BS +. Non-distended. Musc: No edema or tenderness in extremities. Neurological:Somnolent Slow to process.  Motor: Grossly 4/5 throughout, unchanged Skin: Skin iswarmand dry.  Psychiatric:Confused.  Assessment/Plan: 1. Functional deficits secondary to traumatic left frontal intraparenchymal hematoma which require 3+ hours per day of interdisciplinary therapy in a comprehensive inpatient rehab setting.  Physiatrist is providing close team supervision and 24 hour management of active medical problems listed below.  Physiatrist and rehab team continue to assess barriers to discharge/monitor patient progress toward functional and medical goals  Care Tool:  Bathing    Body parts bathed by patient: Right arm, Left arm, Chest, Abdomen, Face, Right upper leg, Left upper leg   Body parts bathed by helper: Buttocks     Bathing assist Assist Level:  Moderate Assistance - Patient 50 - 74%     Upper Body Dressing/Undressing Upper body dressing   What is the patient wearing?: Pull over shirt    Upper body assist Assist Level: Minimal Assistance - Patient > 75%    Lower Body Dressing/Undressing Lower body dressing      What is the patient wearing?: Underwear/pull up, Incontinence brief     Lower body assist Assist for lower body dressing: Moderate Assistance - Patient 50 - 74%     Toileting Toileting    Toileting assist Assist for toileting: Moderate Assistance - Patient 50 - 74%     Transfers Chair/bed transfer  Transfers assist     Chair/bed transfer assist level: Minimal Assistance - Patient > 75%     Locomotion Ambulation   Ambulation assist      Assist level: Minimal Assistance - Patient > 75% Assistive device: Walker-rolling Max distance: 15'   Walk 10 feet activity   Assist     Assist level: Minimal Assistance - Patient > 75% Assistive device: Walker-rolling   Walk 50 feet activity   Assist Walk 50 feet with 2 turns activity did not occur: Safety/medical concerns         Walk 150 feet activity   Assist Walk 150 feet activity did not occur: Safety/medical concerns         Walk 10 feet on uneven surface  activity   Assist Walk 10 feet on uneven surfaces activity did not occur: Safety/medical concerns         Wheelchair     Assist Will patient use  wheelchair at discharge?: (TBD) Type of Wheelchair: Manual    Wheelchair assist level: Supervision/Verbal cueing Max wheelchair distance: 100'    Wheelchair 50 feet with 2 turns activity    Assist        Assist Level: Supervision/Verbal cueing   Wheelchair 150 feet activity     Assist Wheelchair 150 feet activity did not occur: Safety/medical concerns          Medical Problem List and Plan: 1.Decreased functional mobilitysecondary to traumatic left frontal intraparenchymal hematoma  Continue  CIR 2. Antithrombotics: -DVT/anticoagulation:SCDs -antiplatelet therapy: None secondary to intraparenchymal hematoma 3. Pain Management:Lidoderm patch 4. Mood:Provide emotional support -antipsychotic agents: none 5. Neuropsych: This patientis not fully capable of making decisions on herown behalf. 6. Skin/Wound Care:Routine skin checks 7. Fluids/Electrolytes/Nutrition:Routine in and out's 8. 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 Follow-up per EP  9. Moderate severe aortic stenosis. Continue Lopressor 25 mg twice a day -follow for tolerance of physical activity with therapies 10. Dysphagia. Currently on a clear liquid diet.   Advance diet as tolerated  D/c IVF, improving fluid intake 11. Intermittent coffee-ground emesis. 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 Continue PPI.  Follow-up gastroenterology services currently with conservative care.  Hemoglobin 9.8 on 3/25  Labs ordered for Monday  Continue to monitor 12. Diabetes mellitus  Patient had been on Tresiba 10 units daily prior to admission.  Lantus insulin10 units twice daily, decreased to daily on 3/24.  Check blood sugars before meals and at bedtime.   Labile on 3/27 with variable p.o. intake 13. Hypoalbuminemia  Supplement initiated on 3/23 14.  Right pleural effusion  Continue to monitor 15.  Rib fractures  Right 6-8  Conservative care 16.  Leukocytosis  WBCs 12.2 on 3/25  Labs ordered for Monday  Low-grade fever intermittent  UA equivocal, urine culture with multiple species  Empiric Macrobid started on 3/26 17.  Labile blood pressure  Labile on 3/27, monitor for trend 18.  Lethargy  Multifactorial- TBI +/- UTI  See #16  Will consider repeat CT if decline in status,- appears to be improving slowly  LOS: 7 days A FACE TO FACE EVALUATION WAS PERFORMED   Karis Juba 02/21/2019, 8:17 AM

## 2019-02-21 NOTE — Progress Notes (Signed)
Speech Language Pathology Weekly Progress and Session Note  Patient Details  Name: NEAVEH BELANGER MRN: [id number] Date of Birth: 07-08-1941  Beginning of progress report period: February 14, 2019 End of progress report period: February 21, 2019  Today's Date: 02/21/2019 SLP Individual Time: 1000-1040 SLP Individual Time Calculation (min): 40 min  Short Term Goals: Week 1: SLP Short Term Goal 1 (Week 1): Pt will consume trials of dys 2 textures and thin liquids with supervision cues for use of swallowing precautions.   SLP Short Term Goal 1 - Progress (Week 1): Progressing toward goal SLP Short Term Goal 2 (Week 1): Pt will sustain her attention to mildly complex tasks for 10 minute intervals with min verbal cues for redirection.   SLP Short Term Goal 2 - Progress (Week 1): Not met SLP Short Term Goal 3 (Week 1): Pt will complete mildly complex tasks with min assist for functional problem solving.   SLP Short Term Goal 3 - Progress (Week 1): Not met SLP Short Term Goal 4 (Week 1): Pt will recall basic, daily information with min assist verbal cues for use of external aids.   SLP Short Term Goal 4 - Progress (Week 1): Not met SLP Short Term Goal 5 (Week 1): Patient will participate in confrontation naming task with 80% accuracy and min verbal cues. SLP Short Term Goal 5 - Progress (Week 1): Not met SLP Short Term Goal 6 (Week 1): Patient will respond to basic biotraphical -wh questions with 80% accuracy and min verbal cues. SLP Short Term Goal 6 - Progress (Week 1): Not met    New Short Term Goals: Week 2: SLP Short Term Goal 1 (Week 2): STG=LTG due to remaining length of stay.    Weekly Progress Updates:   Pt has made limited gains this reporting period and has met 0 out of 6 short term goals.  Pt is currently max assist for tasks due to limited engagement in combination with increased fatigue and worsening cognitive-linguistic presentation, question UTI.    Pt is consuming full liquids  diet with min cues for use of swallowing precautions.  Trials of advanced solids have been limited due to poor pt appetite and evidence of globus sensation limiting pt's intake.  Discussed with PA and will continue to proceed with trials of solids with SLP to work towards diet progression per pt's toleration.   Pt education is ongoing.  Pt would continue to benefit from skilled ST while inpatient in order to maximize functional independence and reduce burden of care prior to discharge.  Anticipate that pt will need 24/7supervision at discharge in addition to ST follow up at next level of care.      Intensity: Minumum of 1-2 x/day, 30 to 90 minutes Frequency: 3 to 5 out of 7 days Duration/Length of Stay: 10-14 days  Treatment/Interventions: Cognitive remediation/compensation;Cueing hierarchy;Dysphagia/aspiration precaution training;Internal/external aids;Environmental controls;Functional tasks;Patient/family education   Daily Session  Skilled Therapeutic Interventions: Pt was seen for skilled ST targeting goals for cognition and dysphagia.  Pt was eating breakfast upon therapist's arrival with no overt s/s of aspiration with purees or thin liquids.  SLP facilitated the session with trials of dys 2 and 3 textures to continue working towards diet progression.  [name] appetite appears reduced and she needed encouragement to eat.  Advanced solids appeared to elicit globus sensation as evidenced by pt grimacing and patting her sternum after swallowing.  Pt is unable to further explain her symptoms due to current cognitive-linguistic deficits so it  is unclear to determine to what extent this presentation is varied from her baseline.  For now, recommend that pt remain on full liquids diet and thin liquids with full supervision for use of swallowing precautions.  Throughout therapy session, pt was withdrawn and her engagement with therapist was minimal.  She needed max assist for use of environmental aids to recall  basic orientation information or to identify rationale for CIR.  Her responses were often vague and did not answer therapist's questions.  Pt also appeared to fall asleep while sitting upright in wheelchair during conversations with therapist; however, once returned to bed pt could initiate and very clearly state that she did not want a blanket over her, she wanted her blinds closed, and she wanted her TV turned on.  Pt was left in bed with bed alarm set.  Goals updated on this date to reflect current progress and plan of care.    General    Pain Pain Assessment Pain Scale: 0-10 Pain Score: 0-No pain Faces Pain Scale: Hurts a little bit  Therapy/Group: Individual Therapy  Pearlean Sabina, Selinda Orion 02/21/2019, 12:35 PM

## 2019-02-21 NOTE — Progress Notes (Signed)
Occupational Therapy Weekly Progress Note  Patient Details  Name: Danielle Clarke MRN: [id number] Date of Birth: 16-Oct-1941  Beginning of progress report period: February 15, 2019 End of progress report period: February 21, 2019  Today's Date: 02/21/2019  Session 1 OT Individual Time: 4098-1191 OT Individual Time Calculation (min): 55 min   Patient has met 2 of 4 short term goals.  Pt is making slow progress towards OT goals at this time. Pt is limited by lethargy as well as needing max encouragement to participate. Pt can need as little as min A for transfers and BADLs, but often still requires mod A 2/2 fatigue. Continue current POC.  Patient continues to demonstrate the following deficits: muscle weakness, decreased activity tolerance, decreased initiation, decreased attention, decreased awareness, decreased problem solving, decreased safety awareness, decreased memory and delayed processing and decreased sitting balance, decreased standing balance, decreased postural control, decreased balance strategies and difficulty maintaining precautions and therefore will continue to benefit from skilled OT intervention to enhance overall performance with BADL.  Patient progressing toward long term goals..  Continue plan of care.  OT Short Term Goals Week 1:  OT Short Term Goal 1 (Week 1): Pt will be able to don a shirt with min A demonstrating improved R shoulder AROM. OT Short Term Goal 1 - Progress (Week 1): Met OT Short Term Goal 2 (Week 1): Pt will be able to don pants over her feet with min A. OT Short Term Goal 2 - Progress (Week 1): Met OT Short Term Goal 3 (Week 1): Pt will be able to self cleanse after toileting with min A. OT Short Term Goal 3 - Progress (Week 1): Progressing toward goal OT Short Term Goal 4 (Week 1): Pt will complete toilet transfer with S recalling safe transfer steps to demonstrate improved cognition. OT Short Term Goal 4 - Progress (Week 1): Progressing toward  goal Week 2:  OT Short Term Goal 1 (Week 2): Pt will be able to self cleanse after toileting with min A. OT Short Term Goal 2 (Week 2): Pt will complete toilet transfer with S recalling safe transfer steps to demonstrate improved cognition. OT Short Term Goal 3 (Week 2): Pt will tolerate standing grooming task for 2 minutes  Skilled Therapeutic Interventions/Progress Updates:    Session 1 Pt greeted asleep in bed, easy to wake and agreeable to OT with encouragement. Pt ambulated from bed>bathroom with mod HHA and verbal cues not to reach for doorways and walls when ambulating. Pt had already been incontinent of bowel and bladder in brief, but was able to have more BM in commode. Total A for peri-care. Pt then needed max encouragement to take a shower, but was eventually agreeable. Pt needed verbal cues to initiate washing all body parts and assistance to wash buttocks. Pt noted to be incontinent of BM in the shower as well. Worked on LB dressing strategies with pt needing multiple rest breaks within all BADL tasks. Pt was able to thread LLE into pant legs and needed assistance to pull pants over hips. Set-up A to don shirt and OT assist to don socks. Pt left seated in wc with alarm belt on and nurse tech notified that pt has not eaten breakfast.    Therapy Documentation Precautions:  Precautions Precautions: Fall, ICD/Pacemaker Restrictions Weight Bearing Restrictions: No LUE Weight Bearing: Non weight bearing Pain: Pain Assessment Pain Scale: Faces Faces Pain Scale: Hurts little more Pain Type: Acute pain Pain Location: Rib cage Pain Orientation: Right Pain  Descriptors / Indicators: Aching;Grimacing Pain Onset: With Activity Patients Stated Pain Goal: 0 Pain Intervention(s): Repositioned   Therapy/Group: Individual Therapy  Valma Cava 02/21/2019, 11:45 AM

## 2019-02-21 NOTE — Progress Notes (Signed)
Initial Nutrition Assessment  RD working remotely.  DOCUMENTATION CODES:   Not applicable  INTERVENTION:   Continue Glucerna TID (each provides 220 kcal, 10 g protein)  Continue ProStat BID (each provides 100 kcal, 15 g protein)  Advance diet per SLP recommendation  NUTRITION DIAGNOSIS:   Inadequate oral intake related to dysphagia, other (see comment)(full liquid diet) as evidenced by estimated needs, energy intake < 75% for > 7 days.  GOAL:   Patient will meet greater than or equal to 90% of their needs  MONITOR:   PO intake, Diet advancement, Supplement acceptance, Weight trends, Labs  REASON FOR ASSESSMENT:   Other (Comment)(Full liquid diet >7 days)    ASSESSMENT:   78 yo female, admitted with loss of consciousness. PMH significant for DM type 1, HLD, HTN, osteoporosis.  Labs from 3/23: glucose 158 mg/dL - receiving insulin  Meds: Glucerna TID, ProStat BID, novolog, Lantus, Lopressor, Protonix,   RD unable to reach pt via phone. Per SLP progress note, pt unable to advance to dysphagia diet, will continue with full liquid for now. Receiving Glucerna TID and ProStat BID. Per chart review, pt wt stable. Will continue to monitor adequacy of intake and diet advancement.  NUTRITION - FOCUSED PHYSICAL EXAM: Deferred - RD working remotely  Diet Order:   Diet Order            Diet full liquid Room service appropriate? Yes with Assist; Fluid consistency: Thin; Fluid restriction: Other (see comments)  Diet effective now            PO Intake Average 53% x last 8 meals recorded  EDUCATION NEEDS:  No education needs have been identified at this time  Skin:  Skin Assessment: Skin Integrity Issues: Skin Integrity Issues:: Other (Comment) Other: Wound - posterior head; Petechiae - medial abdomen; MASD - perineum, buttocks; Ecchymosis - BL arms, abdomen  Last BM:  3/27  Height:  Ht Readings from Last 1 Encounters:  02/14/19 5' (1.524 m)   Weight:  Wt Readings  from Last 10 Encounters:  02/20/19 51.6 kg  02/14/19 52.2 kg  01/08/19 51.3 kg  12/24/18 51.3 kg  12/21/18 51.3 kg  12/03/18 52.2 kg  10/22/18 52.2 kg  10/15/18 52.2 kg  10/08/18 52.2 kg  10/04/18 52.2 kg  Wt stable x 6 months  Ideal Body Weight:  45.5 kg  BMI:  Body mass index is 22.22 kg/m., considered underweight for age group  Estimated Nutritional Needs: calculated based on 3/26 wt (~52 kg)  Kcal:  1300-1560 (25-30 kcal/kg)  Protein:  52-62 g (1-1.2 g/kg)  Fluid:  >/= 1.5 L daily or per MD  Jolaine Artist, MS, RDN, LDN Pager: [phone] Available Mondays and Fridays, 9am-2pm

## 2019-02-21 NOTE — Progress Notes (Signed)
Physical Therapy Weekly Progress Note  Patient Details  Name: Danielle Clarke MRN: [id number] Date of Birth: Aug 17, 1941  Beginning of progress report period: February 15, 2019 End of progress report period: February 21, 2019  Today's Date: 02/21/2019 PT Individual Time: 1330-1400 PT Individual Time Calculation (min): 30 min  and Today's Date: 02/21/2019 PT Missed Time: 45 Minutes Missed Time Reason: Patient unwilling to participate;Patient fatigue  Patient has met 1 of 5 short term goals. Pt has made limited progress towards LTGs over last week 2/2 lethargy and fatigue. Pt requires max encouragement when she does participate to continue w/ session and to engage in functional upright mobility. As a result, she remains at min assist overall and is ambulating short distances w/ RW.   Patient continues to demonstrate the following deficits muscle weakness, decreased cardiorespiratoy endurance, decreased initiation, decreased attention, decreased awareness, decreased problem solving, decreased safety awareness, decreased memory and delayed processing and decreased sitting balance, decreased standing balance, decreased postural control and decreased balance strategies and therefore will continue to benefit from skilled PT intervention to increase functional independence with mobility.  Patient not progressing toward long term goals.  See goal revision..  Plan of care revisions: bed mobility and transfer goals downgraded to CGA, gait distance goals downgraded.   PT Short Term Goals Week 1:  PT Short Term Goal 1 (Week 1): Pt will participate in OOB activity w/o increase in pain PT Short Term Goal 1 - Progress (Week 1): Met PT Short Term Goal 2 (Week 1): Pt will ambulate 50' w/ LRAD w/ min assist PT Short Term Goal 2 - Progress (Week 1): Not met PT Short Term Goal 3 (Week 1): Pt will initiate stair training  PT Short Term Goal 3 - Progress (Week 1): Not met PT Short Term Goal 4 (Week 1): Pt will follow  simple 2-step commands 50% of the time PT Short Term Goal 4 - Progress (Week 1): Not met PT Short Term Goal 5 (Week 1): Pt will initiate functional mobility w/ min cues for initiation 100% of the time PT Short Term Goal 5 - Progress (Week 1): Progressing toward goal Week 2:  PT Short Term Goal 1 (Week 2): =LTGs due to ELOS  Skilled Therapeutic Interventions/Progress Updates:   Pt received in w/c in care of OT, agreeable to therapy and no c/o pain. Finished eating lunch w/ supervision and verbal cues for attention to task. Total assist w/c transport to therapy gym. When attempted to engage pt in standing activity or gait, pt stated "I think I am tired" repeatedly. When educated on importance of continuing to work w/ therapy, pt stated "just take me back to my room". Pt noticeably breathing heavily, BP 121/42, HR 80 bpm, and O2 sat @ 96%. Returned to room total assist w/c performed stand pivot transfer back to EOB w/ min assist. Ended session in supine, all needs in reach. Missed 45 min of skilled PT 2/2 refusal.   Therapy Documentation Precautions:  Precautions Precautions: Fall, ICD/Pacemaker Restrictions Weight Bearing Restrictions: No LUE Weight Bearing: Non weight bearing Pain: Pain Assessment Pain Scale: 0-10 Pain Score: 0-No pain  Therapy/Group: Individual Therapy  Almon Whitford K Marisol Glazer 02/21/2019, 2:15 PM

## 2019-02-21 NOTE — Plan of Care (Signed)
  Problem: Consults Goal: RH STROKE PATIENT EDUCATION Description See Patient Education module for education specifics  Outcome: Progressing   Problem: RH BLADDER ELIMINATION Goal: RH STG MANAGE BLADDER WITH ASSISTANCE Description STG Manage Bladder With min Assistance  Outcome: Progressing   Problem: RH SKIN INTEGRITY Goal: RH STG MAINTAIN SKIN INTEGRITY WITH ASSISTANCE Description STG Maintain Skin Integrity With min Assistance.  Outcome: Progressing Goal: RH STG ABLE TO PERFORM INCISION/WOUND CARE W/ASSISTANCE Description STG Able To Perform Incision/Wound Care With min Assistance.  Outcome: Progressing   Problem: RH SAFETY Goal: RH STG ADHERE TO SAFETY PRECAUTIONS W/ASSISTANCE/DEVICE Description STG Adhere to Safety Precautions With cues/reminders Assistance/Device.  Outcome: Progressing   Problem: RH PAIN MANAGEMENT Goal: RH STG PAIN MANAGED AT OR BELOW [name] PAIN GOAL Description At or below level 4  Outcome: Progressing   Problem: RH KNOWLEDGE DEFICIT Goal: RH STG INCREASE KNOWLEDGE OF HYPERTENSION Description Pt will be able to state understanding of management of hypertension using resources/handouts with reminders/cues  Outcome: Progressing Goal: RH STG INCREASE KNOWLEDGE OF DYSPHAGIA/FLUID INTAKE Description Able to manage dysphagia and fluid restrictions with cues/reminders  Outcome: Progressing Goal: RH STG INCREASE KNOWLEDGE OF STROKE PROPHYLAXIS Description Manage cardiac issues and prevention of secondary ICH using resources with cues/reminders  Outcome: Progressing Goal: RH STG INCREASE KNOWLEDGE OF DIABETES Description Pt will be able to state understanding of management of diabetes using resources/handouts with reminders/cues   Outcome: Progressing Goal: RH STG INCREASE KNOWLEGDE OF HYPERLIPIDEMIA Description Pt will be able to state understanding of management of hyperlipidemia using resources/handouts with reminders/cues   Outcome:  Progressing   

## 2019-02-21 NOTE — Progress Notes (Addendum)
Occupational Therapy Note  Patient Details  Name: Danielle Clarke MRN: [id number] Date of Birth: May 14, 1941  Today's Date: 02/21/2019  Session 2 OT Individual Time: 3235-5732 OT Individual Time Calculation (min): 12 min   Session 3 OT Individual Time: 2025-4270 OT Individual Time Calculation (min): 28 min    Session 2 Pt asleep in bed upon OT arrival. OT brightened the lights and tried to engage pt in therapy. Pt would open her eyes for short instances, then close them again. OT dependently sat pt at EOB. Pt stated "no, no, no, I won't" and attempted to return to supine. OT continued to try to encourage pt to at least sit EOB, but she would not, and returned to supine. Will follow up per plan of care.    Session 3 Pt greeted semi-reclined in bed being woken up by nurse tech. OT took over and assisted pt to EOB with increased time and mod A. Pt needed verbal cues for hand placement with sit<>stand and min A to then pivot over to wc. OT provided set-up A for lunch. Pt needed increased time initiate self-feeding task, but once she started, she did not need cues to continue eating. Worked on word finding and reading labels during self-feeding task. Pt handoff to PT for next therapy session.   [name] [name] 02/21/2019, 1:42 PM

## 2019-02-22 ENCOUNTER — Inpatient Hospital Stay (HOSPITAL_COMMUNITY): Payer: Medicare Other | Admitting: Speech Pathology

## 2019-02-22 ENCOUNTER — Inpatient Hospital Stay (HOSPITAL_COMMUNITY): Payer: Medicare Other | Admitting: Occupational Therapy

## 2019-02-22 ENCOUNTER — Inpatient Hospital Stay (HOSPITAL_COMMUNITY): Payer: Medicare Other | Admitting: Physical Therapy

## 2019-02-22 DIAGNOSIS — S06340D Traumatic hemorrhage of right cerebrum without loss of consciousness, subsequent encounter: Secondary | ICD-10-CM

## 2019-02-22 LAB — BASIC METABOLIC PANEL
Anion gap: 12 (ref 5–15)
BUN: 30 mg/dL — ABNORMAL HIGH (ref 8–23)
CO2: 26 mmol/L (ref 22–32)
Calcium: 9 mg/dL (ref 8.9–10.3)
Chloride: 98 mmol/L (ref 98–111)
Creatinine, Ser: 0.88 mg/dL (ref 0.44–1.00)
GFR calc Af Amer: >60 mL/min (ref >60)
GFR calc non Af Amer: >60 mL/min (ref >60)
Glucose, Bld: 195 mg/dL — ABNORMAL HIGH (ref 70–99)
Potassium: 3.3 mmol/L — ABNORMAL LOW (ref 3.5–5.1)
Sodium: 136 mmol/L (ref 135–145)

## 2019-02-22 LAB — GLUCOSE, CAPILLARY
Glucose-Capillary: 140 mg/dL — ABNORMAL HIGH (ref 70–99)
Glucose-Capillary: 259 mg/dL — ABNORMAL HIGH (ref 70–99)
Glucose-Capillary: 270 mg/dL — ABNORMAL HIGH (ref 70–99)
Glucose-Capillary: 271 mg/dL — ABNORMAL HIGH (ref 70–99)
Glucose-Capillary: 308 mg/dL — ABNORMAL HIGH (ref 70–99)
Glucose-Capillary: 556 mg/dL (ref 70–99)
Glucose-Capillary: 75 mg/dL (ref 70–99)
Glucose-Capillary: >600 mg/dL (ref 70–99)

## 2019-02-22 LAB — GLUCOSE, RANDOM: Glucose, Bld: 651 mg/dL (ref 70–99)

## 2019-02-22 MED ORDER — SODIUM CHLORIDE 0.9 % IV SOLN
INTRAVENOUS | Status: DC
  Administered 2019-02-22 – 2019-02-24 (×3): via INTRAVENOUS

## 2019-02-22 MED ORDER — INSULIN GLARGINE 100 UNIT/ML ~~LOC~~ SOLN
8.0000 [IU] | Freq: Every day | SUBCUTANEOUS | Status: DC
  Filled 2019-02-22: qty 0.08

## 2019-02-22 MED ORDER — INSULIN GLARGINE 100 UNIT/ML ~~LOC~~ SOLN
14.0000 [IU] | Freq: Every day | SUBCUTANEOUS | Status: DC
  Administered 2019-02-23 – 2019-02-25 (×3): 14 [IU] via SUBCUTANEOUS
  Filled 2019-02-22 (×5): qty 0.14

## 2019-02-22 MED ORDER — INSULIN ASPART 100 UNIT/ML ~~LOC~~ SOLN
20.0000 [IU] | Freq: Once | SUBCUTANEOUS | Status: AC
  Administered 2019-02-22: 20 [IU] via SUBCUTANEOUS

## 2019-02-22 MED ORDER — METFORMIN HCL 500 MG PO TABS
500.0000 mg | ORAL_TABLET | Freq: Every day | ORAL | Status: DC
  Administered 2019-02-22 – 2019-02-28 (×7): 500 mg via ORAL
  Filled 2019-02-22 (×7): qty 1

## 2019-02-22 NOTE — Progress Notes (Addendum)
Occupational Therapy Session Note  Patient Details  Name: Danielle Clarke MRN: [id number] Date of Birth: December 26, 1940  Today's Date: 02/22/2019 OT Individual Time: 1010-1100 OT Individual Time Calculation (min): 50 min   Patient missed 2nd session as nurse tech informed this clinician patient on bed rest for a bit with blood sugars around 600 when taken in the late morning. (missed the 30 minute 2nd session)   Short Term Goals: Week 2:  OT Short Term Goal 1 (Week 2): Pt will be able to self cleanse after toileting with min A. OT Short Term Goal 2 (Week 2): Pt will complete toilet transfer with S recalling safe transfer steps to demonstrate improved cognition. OT Short Term Goal 3 (Week 2): Pt will tolerate standing grooming task for 2 minutes  Skilled Therapeutic Interventions/Progress Updates: patient in recliner and groggy upon approach for session.   She stated she did not feel up to completing therapy today but she was able to stay awake long enough to participate as follows:  Donning of pants sitting in recliner= max A as patient groggy and with difficulty staying attentive Sit to stand from recliner at RW= Min A and extra time Pulling up pants over hips at walker with max assist due to patient c/o fatigue and groggy affect Sat at recliner and donned blouse with setup Washed face with setup.  Session ended early due to patient not able to continue attending  Chair alarm and call bell intact.   Patient demonstrated ability to use call bell for help.  Continue OT Plan of Care  Therapy Documentation Precautions:  Precautions Precautions: Fall, ICD/Pacemaker Restrictions Weight Bearing Restrictions: No LUE Weight Bearing: Non weight bearing  General OT Amount of Missed Time: 10 Minutes   Pain:denied     Therapy/Group: Individual Therapy  Bud Face [facility] 02/22/2019, 2:30 PM

## 2019-02-22 NOTE — Progress Notes (Signed)
Speech Language Pathology Daily Session Note  Patient Details  Name: Danielle Clarke MRN: [id number] Date of Birth: 1941-11-06  Today's Date: 02/22/2019 SLP Individual Time: 1410-1450 SLP Individual Time Calculation (min): 40 min  Short Term Goals: Week 2: SLP Short Term Goal 1 (Week 2): STG=LTG due to remaining length of stay.    Skilled Therapeutic Interventions: Skilled treatment session focused on cognitive-linguistic goals. Upon arrival, patient was awake while upright in bed. SLP facilitated session with a basic conversation that focused on activities she liked to do at home as well as discharge planning. Patient attempted to engage in conversation but utilized circumlocution and vague answers to all open ended questions and was unable to elaborate despite max A multimodal cues. Patient grimaced and then reported she needed to use the bathroom. Patient required more than a reasonable amount of time to sit EOB and to initiate standing which led to incontinence of both bowel and bladder. Patient ambulated to the bathroom and required Mod A verbal cues for problem solving with self-care. Patient left upright in recliner with all needs within reach. Continue with current plan of care.      Pain No/Denies Pain   Therapy/Group: Individual Therapy  Taniya Dasher 02/22/2019, 3:03 PM

## 2019-02-22 NOTE — Progress Notes (Addendum)
PHYSICAL MEDICINE & REHABILITATION PROGRESS NOTE  Subjective/Complaints: No issues overnite  ROS: Aphasic but denies CP, SOB, N/V/D Objective: Vital Signs: Blood pressure (!) 164/46, pulse 80, temperature 99 F (37.2 C), temperature source Oral, resp. rate 18, height 5' (1.524 m), weight 48.2 kg, SpO2 98 %. No results found. Recent Labs    02/19/19 1453  WBC 12.2*  HGB 9.8*  HCT 30.1*  PLT 284   No results for input(s): NA, K, CL, CO2, GLUCOSE, BUN, CREATININE, CALCIUM in the last 72 hours.  Physical Exam: BP (!) 164/46   Pulse 80   Temp 99 F (37.2 C) (Oral)   Resp 18   Ht 5' (1.524 m)   Wt 48.2 kg   SpO2 98%   BMI 20.75 kg/m  Constitutional: No distress . Vital signs reviewed. HENT: Normocephalic.  Atraumatic. Eyes: EOMI. No discharge. Cardiovascular: RRR.  No JVD.  + Murmur Respiratory: CTA bilaterally.  Normal effort. GI: BS +. Non-distended. Musc: No edema or tenderness in extremities. Neurological:Somnolent Slow to process.  Motor: Grossly 4/5 throughout, unchanged Skin: Skin iswarmand dry.  Psychiatric:Confused.  Assessment/Plan: 1. Functional deficits secondary to traumatic left frontal intraparenchymal hematoma which require 3+ hours per day of interdisciplinary therapy in a comprehensive inpatient rehab setting.  Physiatrist is providing close team supervision and 24 hour management of active medical problems listed below.  Physiatrist and rehab team continue to assess barriers to discharge/monitor patient progress toward functional and medical goals  Care Tool:  Bathing    Body parts bathed by patient: Right arm, Left arm, Chest, Abdomen, Face, Right upper leg, Left upper leg   Body parts bathed by helper: Buttocks     Bathing assist Assist Level: Moderate Assistance - Patient 50 - 74%     Upper Body Dressing/Undressing Upper body dressing   What is the patient wearing?: Pull over shirt    Upper body assist Assist Level:  Minimal Assistance - Patient > 75%    Lower Body Dressing/Undressing Lower body dressing      What is the patient wearing?: Underwear/pull up, Incontinence brief     Lower body assist Assist for lower body dressing: Moderate Assistance - Patient 50 - 74%     Toileting Toileting    Toileting assist Assist for toileting: Moderate Assistance - Patient 50 - 74%     Transfers Chair/bed transfer  Transfers assist     Chair/bed transfer assist level: Minimal Assistance - Patient > 75%     Locomotion Ambulation   Ambulation assist      Assist level: Minimal Assistance - Patient > 75% Assistive device: Walker-rolling Max distance: 15'   Walk 10 feet activity   Assist     Assist level: Minimal Assistance - Patient > 75% Assistive device: Walker-rolling   Walk 50 feet activity   Assist Walk 50 feet with 2 turns activity did not occur: Safety/medical concerns         Walk 150 feet activity   Assist Walk 150 feet activity did not occur: Safety/medical concerns         Walk 10 feet on uneven surface  activity   Assist Walk 10 feet on uneven surfaces activity did not occur: Safety/medical concerns         Wheelchair     Assist Will patient use wheelchair at discharge?: (TBD) Type of Wheelchair: Manual    Wheelchair assist level: Supervision/Verbal cueing Max wheelchair distance: 100'    Wheelchair 50 feet with 2 turns activity  Assist        Assist Level: Supervision/Verbal cueing   Wheelchair 150 feet activity     Assist Wheelchair 150 feet activity did not occur: Safety/medical concerns          Medical Problem List and Plan: 1.Decreased functional mobilitysecondary to traumatic left frontal intraparenchymal hematoma   CIR PT, OT, SLP 2. Antithrombotics: -DVT/anticoagulation:SCDs -antiplatelet therapy: None secondary to intraparenchymal hematoma 3. Pain Management:Lidoderm patch 4.  Mood:Provide emotional support -antipsychotic agents: none 5. Neuropsych: This patientis not fully capable of making decisions on herown behalf. 6. Skin/Wound Care:Routine skin checks 7. Fluids/Electrolytes/Nutrition:Routine in and out's 8. 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 Follow-up per EP  9. Moderate severe aortic stenosis. Continue Lopressor 25 mg twice a day -follow for tolerance of physical activity with therapies 10. Dysphagia. Currently on a clear liquid diet.   Advance diet as tolerated  D/c IVF, improving fluid intake 11. Intermittent coffee-ground emesis. 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 Continue PPI.  Follow-up gastroenterology services currently with conservative care.  Hemoglobin 9.8 on 3/25  Labs ordered for Monday  Continue to monitor 12. Diabetes mellitus  Patient had been on Tresiba 10 units daily prior to admission.  Lantus insulin10 units twice daily, decreased to daily on 3/24.  Check blood sugars before meals and at bedtime.    CBG (last 3)  Recent Labs    02/22/19 0402 02/22/19 0428 02/22/19 0651  GLUCAP 270* 271* 75  Labile am CBG on low side reduce to 8U Add am dose metformin CBG up to 600 reading , seem to be outlier , check stat  serum glucose before changing therapy 13. Hypoalbuminemia  Supplement initiated on 3/23 14.  Right pleural effusion  Continue to monitor 15.  Rib fractures  Right 6-8  Conservative care 16.  Leukocytosis  WBCs 12.2 on 3/25  Labs ordered for Monday  Low-grade fever intermittent  UA equivocal, urine culture with multiple species  Empiric Macrobid started on 3/26 17.  Labile blood pressure   Vitals:   02/22/19 0400 02/22/19 0447  BP: (!) 172/55 (!) 164/46  Pulse: 77 80  Resp: 18   Temp: 99 F (37.2 C)   SpO2: 98%   elevated systolic BP on lopressor 25mg  BID, on Norvasc at home will restart 18.  Hx Lethargy- alert this am-  aphasia  Multifactorial- TBI, Urine cx neg d/c macrobid   Will consider repeat CT if decline in status,- appears to be improving slowly  LOS: 8 days A FACE TO FACE EVALUATION WAS PERFORMED  Erick Colace 02/22/2019, 8:16 AM

## 2019-02-22 NOTE — Progress Notes (Signed)
Physical Therapy Session Note  Patient Details  Name: Danielle Clarke MRN: [id number] Date of Birth: [birth year]  Today's Date: 02/22/2019 PT Individual Time: 0900-0956 PT Individual Time Calculation (min): 56 min   Short Term Goals: Week 2:  PT Short Term Goal 1 (Week 2): =LTGs due to ELOS  Skilled Therapeutic Interventions/Progress Updates:   Pt in supine and agreeable to therapy w/ encouragement, no c/o pain. Supervision bed mobility w/ increased time and sat at EOB to eat part of breakfast w/ close supervision. Verbal cues for attention to task and initiation. Pt reporting she needed to toilet. RW placed in front of pt w/ verbal cues to stand and walk to bathroom, pt very slow to initiate and repeatedly said "give me a minute" for 5+ minutes. Min assist sit<>stand and CGA to ambulate to/from toilet. Pt incontinent of urine, bedsheets and brief noted to be soaked. Total assist for brief change and supervision for pericare, min assist sit<>stands from toilet. Ambulated to sink to wash hands w/ CGA and returned to recliner. Set-up room environment to mimic bedroom w/ window blinds open and thus more functional environment to encourage OOB and to work on increasing # of waking hours during day. Made note for nursing staff as well. Pt w/ increased work of breathing throughout session, required moderate amount of encouragement to continue participating meaningfully in functional activity. Mod-max verbal and tactile cues for initiation of functional tasks throughout session 2/2 delayed processing. Pt agreeable to take a walk into hallway prior to end of session, ambulated 30' w/ CGA, max encouragement to make it that far prior to returning to room. Pt repeatedly stated "I need to go back to my room", when asked why pt stated "I just need to". Max verbal cues to identify a justifiable reason. Ended session in recliner, all needs in reach.  Therapy Documentation Precautions:  Precautions Precautions:  Fall, ICD/Pacemaker Restrictions Weight Bearing Restrictions: No LUE Weight Bearing: Non weight bearing Vital Signs:   Therapy/Group: Individual Therapy  Layah Skousen K Kadra Kohan 02/22/2019, 10:00 AM

## 2019-02-22 NOTE — Consult Note (Signed)
[facility] Consult Follow-up Note  Danielle Clarke ZOX:[id number] DOB: 02/08/1941 DOA: 02/14/2019 PCP: Catha Gosselin, MD Requesting physician: Wynn Banker Reason for consultation: Hyperglycemia Attending service: Rehab  Impression/Recommendations: 1. Diabetes mellitus type 2-poorly controlled this morning and was rating 600 now is down to 300-I asked for a CBG and it shows that it is resolved down from 556-308 on the basic metabolic panel is currently 195-I think that that test was probably his previous lab--- it looks like patient was given an extra 20 units of regular insulin this afternoon at 3 PM by primary service  Will titrate Lantus from 8 units to 14 units based on nursing report of p.o. intake 100% morning 75% afternoon 50% evening  I would continue the metformin carefully although recent evidence suggests that GFR slightly less than 30 is a cut off for theoretical metabolic acidosis 2. Mild acute kidney injury-force fluids 200 cc every 6 hours, added IV saline 75 cc/h for now 3. Dysphagia-patient needs a sitter at all times because of pocketing continue the same 4. 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, was hyponatremic had coffee-ground emesis which was managed conservatively as there was absence of life-threatening bleed and as a result of all these issues was admitted to: Inpatient rehab 02/14/2019 HTN, HLD, carotid artery disease, aortic stenosis, DM TY 2  Hospitalist service was consulted secondary to hyperglycemia of the  600s this afternoon which seem to come down subsequently  Past medical history: As per attending note  Other Consultants:  None  Procedures:   Objective: Vitals:   02/21/19 2233 02/22/19 0400 02/22/19 0447 02/22/19 1617  BP: (!) 152/54 (!) 172/55 (!) 164/46 (!) 130/46  Pulse: 79 77 80 82  Resp: 18 18  20   Temp: 98.9 F (37.2 C) 99 F (37.2 C)  97.8 F (36.6 C)  TempSrc: Oral Oral  Oral  SpO2: 97% 98%  100%  Weight:  48.2 kg    Height:         Intake/Output Summary (Last 24 hours) at 02/22/2019 1750 Last data filed at 02/21/2019 1900 Gross per 24 hour  Intake 1100 ml  Output -  Net 1100 ml    Exam: General: EOMI NCAT no distress smiling appropriately S1-S2 no murmur rub or gallop Chest clear no added sound Abdomen soft nontender no rebound no guarding Neurologicall-- has echolalia able to raise arms above head and is able to tell me the year and date but when I asked her if she is able to tell  Data Reviewed: Basic Metabolic Panel: Recent Labs  Lab 02/16/19 1149 02/17/19 0618 02/22/19 1217 02/22/19 1707  NA 135 136  --  136  K 4.6 4.2  --  3.3*  CL 97* 99  --  98  CO2 29 27  --  26  GLUCOSE 218* 158* 651* 195*  BUN 18 13  --  30*  CREATININE 0.76 0.70  --  0.88  CALCIUM 8.3* 8.4*  --  9.0  MG 1.7  --   --   --    Liver Function Tests: Recent Labs  Lab 02/16/19 1149 02/17/19 0618  AST 36 37  ALT 35 41  ALKPHOS 85 102  BILITOT 0.4 0.4  PROT 5.3* 5.8*  ALBUMIN 2.5* 2.7*   No results for input(s): LIPASE, AMYLASE in the last 168 hours. No results for input(s): AMMONIA in the last 168 hours. CBC: Recent Labs  Lab 02/16/19 1149 02/17/19 0618 02/19/19 1453  WBC 8.5 10.2 12.2*  NEUTROABS 6.1 7.3  --   HGB 10.6* 10.7* 9.8*  HCT 31.5* 33.3* 30.1*  MCV 89.5 89.5 89.9  PLT 173 223 284   Cardiac Enzymes: No results for input(s): CKTOTAL, CKMB, CKMBINDEX, TROPONINI in the last 168 hours. BNP: Invalid input(s): POCBNP CBG: Recent Labs  Lab  02/22/19 0428 02/22/19 0651 02/22/19 1145 02/22/19 1503 02/22/19 1638  GLUCAP 271* 75 >600* 556* 308*    Recent Results (from the past 240 hour(s))  Urine Culture     Status: Abnormal   Collection Time: 02/19/19  4:12 PM  Result Value Ref Range Status   Specimen Description URINE, CLEAN CATCH  Final   Special Requests   Final    NONE Performed at [facility], [address], [place], [place] [address]    Culture MULTIPLE SPECIES PRESENT, SUGGEST RECOLLECTION (A)  Final   Report Status 02/20/2019 FINAL  Final     Studies: Dg Chest 2 View  Result Date: 02/11/2019 CLINICAL DATA:  Pacemaker placement EXAM: CHEST - 2 VIEW COMPARISON:  Three days ago FINDINGS: Interval dual-chamber pacer from the left with leads over the right atrial appendage and right ventricle. Increased hazy density of the inferior chest attributed to small to moderate pleural effusions. No Kerley lines. No pneumothorax. Normal heart size. A feeding tube at least reaches the stomach. IMPRESSION: 1. New dual-chamber pacer without complicating feature. 2. New small to moderate pleural effusions. Electronically Signed   By: Marnee Spring M.D.   On: 02/11/2019 06:23   Ct Head Wo Contrast  Result Date: 02/16/2019 CLINICAL DATA:  Intracranial hemorrhage. EXAM: CT HEAD WITHOUT CONTRAST TECHNIQUE: Contiguous axial images were obtained from the base of the skull through the vertex without intravenous contrast. COMPARISON:  CT head without contrast 02/11/2019 and 02/07/2019. FINDINGS: Brain: There is expected evolution of a large anterior left frontal lobe hemorrhage. Surrounding edema is present. Midline shift is increased, now measuring 8 mm. There is partial effacement the ventral horn of the left lateral ventricle. Left subdural blood has slightly decreased. Hemorrhagic contusions are again noted within the anterior temporal lobes bilaterally. No new hemorrhage is present. Basal ganglia are intact. Brainstem and  cerebellum are normal. Vascular: Atherosclerotic calcifications are present within the cavernous internal carotid arteries and at the dural margin of both vertebral arteries. There is no hyperdense vessel. Skull: Calvarium is intact. No focal lytic or blastic lesions are present. Sinuses/Orbits: A single anterior right ethmoid air cell is opacified. Bilateral lens replacements are noted. Globes and orbits are otherwise unremarkable. IMPRESSION: 1. Expected evolution of anterior left frontal lobe hemorrhagic contusion. 2. Decreasing left frontal subdural blood. 3. Persistent surrounding edema with slight increase in midline shift. 4. Expected evolution of bilateral temporal tip hemorrhagic contusions. 5. No new hemorrhage. Electronically Signed   By: Marin Roberts M.D.   On: 02/16/2019 21:07   Ct Head Wo Contrast  Result Date: 02/11/2019 CLINICAL DATA:  Follow-up examination for intracranial hemorrhage. EXAM: CT HEAD WITHOUT CONTRAST TECHNIQUE: Contiguous axial images were obtained from the base of the skull through the vertex without intravenous contrast. COMPARISON:  Prior CT from 02/07/2019 FINDINGS:  Brain: There has been slight interval decrease in size of the dominant left frontal intraparenchymal hemorrhage, measuring up to approximately 4 cm in maximal diameter. Surrounding low-density vasogenic edema has increased with worsened regional mass effect. Mass effect on the adjacent frontal horn of the left lateral ventricle with up to 6 mm of left-to-right shift, increased from previous. No hydrocephalus or ventricular trapping. Additional small hemorrhagic contusions involving the anterior temporal poles relatively similar in size without significant regional mass effect. Scattered subarachnoid hemorrhage has decreased from previous. Extra-axial hemorrhage overlying the left frontal convexity relatively similar measuring up to 7 mm in maximal thickness. Subdural hemorrhage overlying the left parietal  convexity relatively similar as well measuring up to 6 mm in maximal thickness, although a slightly less dense as compared to previous exam. There has been interval development of a small subdural hygroma overlying the right cerebral hemisphere measuring up to 5 mm. Remainder the brain is stable in appearance. No new intracranial hemorrhage. No acute large vessel territory infarct. Vascular: No hyperdense vessel. Calcified atherosclerosis noted at the skull base. Skull: Decreasing right parietal scalp contusion. Calvarium unchanged. Sinuses/Orbits: Globes and orbital soft tissues demonstrate no acute finding. Paranasal sinuses remain clear. Nasogastric tube noted. No mastoid effusion. Other: None. IMPRESSION: 1. Overall interval decrease in size of dominant 4 cm left frontal intraparenchymal hemorrhage, but with increased surrounding edema and regional mass effect. Associated left-to-right shift now measures up to 6 mm. No hydrocephalus or ventricular trapping. 2. Similar parenchymal contusions involving the anterior temporal poles bilaterally, with decreased subarachnoid hemorrhage as compared to previous. Subdural hemorrhage overlying the left cerebral convexity little interval changed. 3. Interval development of 5 mm right subdural hygroma. Electronically Signed   By: Rise Mu M.D.   On: 02/11/2019 17:15   Ct Head Wo Contrast  Result Date: 02/07/2019 CLINICAL DATA:  Intracranial hemorrhage with new vomiting and change in pupil EXAM: CT HEAD WITHOUT CONTRAST TECHNIQUE: Contiguous axial images were obtained from the base of the skull through the vertex without intravenous contrast. COMPARISON:  Brain MRI from yesterday FINDINGS: Brain: Mixed mainly high-density hematoma in the left frontal lobe unchanged from MRI at up to 4 cm diameter. Increase in regional edema with local mass effect. Scattered subarachnoid hemorrhage. Subdural hematoma along the left cerebral convexity measuring up to 6 mm in  thickness, not increased. Hemorrhagic contusion in the right temporal pole with mild local subarachnoid hemorrhage. No appreciable mass effect in this area. Midline shift measures up to 4 mm at the anterior septum pellucidum. Vascular: Negative Skull: Right parietal scalp hematoma. Sinuses/Orbits: Bilateral cataract resection. IMPRESSION: No increase in multifocal intracranial hemorrhage, but there is increased swelling around the 4 cm left frontal hematoma with midline shift measuring up to 4 mm. No herniation or entrapment. Electronically Signed   By: Marnee Spring M.D.   On: 02/07/2019 05:22   Ct Head Wo Contrast  Result Date: 02/06/2019 CLINICAL DATA:  Fall EXAM: CT HEAD WITHOUT CONTRAST CT CERVICAL SPINE WITHOUT CONTRAST TECHNIQUE: Multidetector CT imaging of the head and cervical spine was performed following the standard protocol without intravenous contrast. Multiplanar CT image reconstructions of the cervical spine were also generated. COMPARISON:  Head CT 01/04/2017 FINDINGS: CT HEAD FINDINGS Brain: There is a left frontal intraparenchymal hematoma measuring 2.2 x 2.2 x 3.9 cm (volume = 9.9 cm^3) with moderate surrounding edema. There is mixed subdural and subarachnoid blood over the left convexity, extending into the sylvian fissure. Smaller amount of subarachnoid blood the right sylvian fissure.  No midline shift or other significant mass effect. No hydrocephalus. Vascular: No abnormal hyperdensity of the major intracranial arteries or dural venous sinuses. No intracranial atherosclerosis. Skull: Large right parietal posterior scalp hematoma. Sinuses/Orbits: No fluid levels or advanced mucosal thickening of the visualized paranasal sinuses. No mastoid or middle ear effusion. The orbits are normal. CT CERVICAL SPINE FINDINGS Alignment: No static subluxation. Facets are aligned. Occipital condyles are normally positioned. Skull base and vertebrae: There is an old type 2 fracture of the dens with  chronic nonunion. There is there is C3-C6 anterior fusion with solid anterior fusion mass. No acute fracture. Soft tissues and spinal canal: No prevertebral fluid or swelling. No visible canal hematoma. Disc levels: No bony spinal canal stenosis. Upper chest: No pneumothorax, pulmonary nodule or pleural effusion. Other: Normal visualized paraspinal cervical soft tissues. IMPRESSION: 1. Left frontal intraparenchymal hematoma with volume approximately 10 mL and mild surrounding edema with small volume subarachnoid/subdural blood over both convexities, left-greater-than-right. 2. Posterior right parietal scalp hematoma, consistent with a contrecoup pattern of injury. 3. No acute fracture or static subluxation of the cervical spine. 4. 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 Critical Value/emergent results were called by telephone at the time of interpretation on 02/06/2019 at 8:41 pm to Dr. Mancel Bale , who verbally acknowledged these results. Electronically Signed   By: Deatra Robinson M.D.   On: 02/06/2019 20:47   Ct Chest W Contrast  Result Date: 02/06/2019 CLINICAL DATA:  Blunt abdominal trauma. EXAM: CT CHEST, ABDOMEN, AND PELVIS WITH CONTRAST TECHNIQUE: Multidetector CT imaging of the chest, abdomen and pelvis was performed following the standard protocol during bolus administration of intravenous contrast. CONTRAST:  OMNIPAQUE IOHEXOL 300 MG/ML  SOLN COMPARISON:  CT chest 08/06/2017. FINDINGS: CT CHEST FINDINGS Cardiovascular: Normal heart size. No pericardial effusions. Coronary artery and mitral valve annulus calcifications. Normal caliber thoracic aorta with calcifications. No aortic dissection. Central pulmonary arteries are well opacified without evidence of significant pulmonary embolus. Mediastinum/Nodes: Esophagus is decompressed. No significant lymphadenopathy in the chest. No abnormal mediastinal gas or fluid collections. Lungs/Pleura: Atelectasis in the lung bases. No  focal consolidation. No pleural effusions. No pneumothorax. Airways are patent. Musculoskeletal: Postoperative changes with lower cervical anterior fixation and right shoulder arthroplasty. Thoracolumbar scoliosis convex towards the right in the thoracic region and towards the left in the lumbar region. Diffuse degenerative changes throughout the thoracic spine. No vertebral compression deformities. Sternum and ribs appear intact. No acute displaced fractures identified. CT ABDOMEN PELVIS FINDINGS Hepatobiliary: No hepatic injury or perihepatic hematoma. Gallbladder is unremarkable Pancreas: Unremarkable. No pancreatic ductal dilatation or surrounding inflammatory changes. Spleen: No splenic injury or perisplenic hematoma. Adrenals/Urinary Tract: No adrenal hemorrhage or renal injury identified. Bladder is unremarkable. Stomach/Bowel: Stomach, small bowel, and colon are not abnormally distended. Stool fills the colon. No wall thickening or inflammatory changes appreciated. No mesenteric hematoma or gas collections. Appendix is not identified. Vascular/Lymphatic: Aortic atherosclerosis. No enlarged abdominal or pelvic lymph nodes. Reproductive: Uterus and ovaries are not enlarged. Calcifications in the uterus likely representing calcified fibroids. Other: No free air or free fluid in the abdomen. Abdominal wall musculature appears intact. Musculoskeletal: Thoracolumbar scoliosis. No vertebral compression deformities. Degenerative changes throughout the lumbar spine with narrowed interspaces and endplate hypertrophic changes. Sacrum, pelvis, and hips appear intact. No acute fractures are identified. IMPRESSION: 1. No acute posttraumatic changes demonstrated in the chest, abdomen, or pelvis. 2. Atelectasis in the lung bases. 3. No evidence of mediastinal injury or pulmonary parenchymal injury.  4. No evidence of solid organ injury or bowel perforation. 5. Thoracolumbar scoliosis and degenerative changes. 6. Calcified  uterine fibroids. 7. Aortic atherosclerosis. Electronically Signed   By: Burman Nieves M.D.   On: 02/06/2019 20:53   Ct Cervical Spine Wo Contrast  Result Date: 02/06/2019 CLINICAL DATA:  Fall EXAM: CT HEAD WITHOUT CONTRAST CT CERVICAL SPINE WITHOUT CONTRAST TECHNIQUE: Multidetector CT imaging of the head and cervical spine was performed following the standard protocol without intravenous contrast. Multiplanar CT image reconstructions of the cervical spine were also generated. COMPARISON:  Head CT 01/04/2017 FINDINGS: CT HEAD FINDINGS Brain: There is a left frontal intraparenchymal hematoma measuring 2.2 x 2.2 x 3.9 cm (volume = 9.9 cm^3) with moderate surrounding edema. There is mixed subdural and subarachnoid blood over the left convexity, extending into the sylvian fissure. Smaller amount of subarachnoid blood the right sylvian fissure. No midline shift or other significant mass effect. No hydrocephalus. Vascular: No abnormal hyperdensity of the major intracranial arteries or dural venous sinuses. No intracranial atherosclerosis. Skull: Large right parietal posterior scalp hematoma. Sinuses/Orbits: No fluid levels or advanced mucosal thickening of the visualized paranasal sinuses. No mastoid or middle ear effusion. The orbits are normal. CT CERVICAL SPINE FINDINGS Alignment: No static subluxation. Facets are aligned. Occipital condyles are normally positioned. Skull base and vertebrae: There is an old type 2 fracture of the dens with chronic nonunion. There is there is C3-C6 anterior fusion with solid anterior fusion mass. No acute fracture. Soft tissues and spinal canal: No prevertebral fluid or swelling. No visible canal hematoma. Disc levels: No bony spinal canal stenosis. Upper chest: No pneumothorax, pulmonary nodule or pleural effusion. Other: Normal visualized paraspinal cervical soft tissues. IMPRESSION: 1. Left frontal intraparenchymal hematoma with volume approximately 10 mL and mild surrounding  edema with small volume subarachnoid/subdural blood over both convexities, left-greater-than-right. 2. Posterior right parietal scalp hematoma, consistent with a contrecoup pattern of injury. 3. No acute fracture or static subluxation of the cervical spine. 4. 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 Critical Value/emergent results were called by telephone at the time of interpretation on 02/06/2019 at 8:41 pm to Dr. Mancel Bale , who verbally acknowledged these results. Electronically Signed   By: Deatra Robinson M.D.   On: 02/06/2019 20:47   Mr Maxine Glenn Head Wo Contrast  Result Date: 02/07/2019 CLINICAL DATA:  Initial evaluation for acute syncope, fall, traumatic intracranial hemorrhage. EXAM: MRI HEAD WITHOUT AND WITH CONTRAST MRA HEAD WITHOUT CONTRAST TECHNIQUE: Multiplanar, multiecho pulse sequences of the brain and surrounding structures were obtained without and with intravenous contrast. Angiographic images of the head were obtained using MRA technique without contrast. CONTRAST:  5 cc of Gadavist. COMPARISON:  Prior CT from earlier the same day. FINDINGS: MRI HEAD FINDINGS Brain: Examination mildly degraded by motion artifact. Intraparenchymal hematoma centered at the anterior left frontal lobe again seen, measuring 3.9 x 4.1 x 5.1 cm (estimated volume 40 cc, previously 10 cc). Internal fluid fluid level. Localized edema and regional mass effect mildly increased, with partial effacement of the frontal horn of the adjacent left lateral ventricle. Mild 4 mm left-to-right midline shift. Additional small hemorrhagic contusions at the temporal poles bilaterally, measuring 17 mm on the right (series 26, image 21) and 14 mm on the left (series 26, image 19). Localized edema without significant regional mass effect. Moderate volume subarachnoid hemorrhage involving the left greater than right cerebral hemispheres with extension into the sylvian fissures. Small amount of subarachnoid seen within the  interpeduncular  cistern. Small volume intraventricular hemorrhage now seen, likely related to redistribution. No hydrocephalus or ventricular trapping. Left holo hemispheric subdural hematoma measures up to 6 mm in maximal thickness at the level of the left parietal convexity. Smaller right convexity subdural hemorrhage measures up to 3 mm in maximal thickness. Mild extension along the falx noted. Underlying age-related cerebral atrophy. Patchy T2/FLAIR hyperintensity within the bilateral thalami, likely related to chronic microvascular ischemic disease, mild for age. No evidence for acute or subacute infarct. Gray-white matter differentiation otherwise maintained. No areas of chronic infarction. No mass lesion. Minimal patchy enhancement underlying the left frontal hematoma felt to be reactive in nature. No underlying discrete mass identified. No other abnormal enhancement within the brain. Pituitary gland suprasellar region normal. Midline structures intact. Vascular: Major intracranial vascular flow voids maintained. Skull and upper cervical spine: Craniocervical junction within normal limits. Postsurgical changes noted within the upper cervical spine. Bone marrow signal intensity within normal limits. Left parietal scalp contusion with superimposed 13 mm soft tissue hematoma noted. Patient status post bilateral ocular lens replacement. Paranasal sinuses are largely clear. Right concha bullosa with associated right-to-left nasal septal deviation noted. No mastoid effusion. Inner ear structures grossly normal. Sinuses/Orbits: None. Other: None. MRA HEAD FINDINGS ANTERIOR CIRCULATION: Examination mildly degraded by motion artifact. Distal cervical segments of the internal carotid arteries are widely patent with symmetric antegrade flow. Petrous, cavernous, and supraclinoid segments patent with scattered atheromatous irregularity. Probable short-segment moderate stenoses at the anterior cavernous right ICA and  supraclinoid left ICA. Apparent 3 mm outpouching arising at the region of the right ophthalmic artery, which could reflect a small aneurysm or vascular infundibulum (series 7, image 93). Finding not entirely certain on this motion degraded exam. ICA termini well perfused. A1 segments widely patent bilaterally. Normal anterior communicating artery. Anterior cerebral arteries patent to their distal aspects without stenosis. M1 segments patent bilaterally. Normal MCA bifurcations. Distal MCA branches well perfused and symmetric. No vascular abnormality seen underlying the left frontal hematoma. POSTERIOR CIRCULATION: Vertebral arteries patent to the vertebrobasilar junction without flow-limiting stenosis. Left PICA patent. Right PICA not seen. Probable short fenestration at the proximal basilar artery noted. Basilar patent to its distal aspect without stenosis. Superior cerebral arteries patent bilaterally. PCA supplied via the basilar as well as small bilateral posterior communicating arteries. PCAs patent to their distal aspects without appreciable stenosis. IMPRESSION: MRI HEAD IMPRESSION: 1. Interval expansion of left frontal intraparenchymal hematoma, now measuring up to 40 cc in volume (previously 10 cc). Localized edema and regional mass effect mildly increased. No underlying mass lesion or other abnormality. 2. Additional small hemorrhagic contusions at the anterior temporal poles bilaterally as above. 3. Moderate volume traumatic subarachnoid hemorrhage involving the left greater than right cerebral hemispheres with small left greater than right subdural hematomas. Associated mild 4 mm left-to-right midline shift. No hydrocephalus or ventricular trapping. 4. Evolving right parietal scalp contusion. 5. Otherwise negative brain MRI. No other findings to explain intracranial hemorrhage identified. MRA HEAD IMPRESSION: 1. Technically limited exam due to motion artifact. 2. No vascular abnormality underlying the  left frontal intraparenchymal hematoma identified. 3. 3 mm focal outpouching arising from the cavernous right ICA, which could reflect a small aneurysm versus vascular infundibulum, difficult to be certain given motion artifact on this exam. Either way, finding felt to most likely be incidental in nature, and unrelated to the acute intracranial hemorrhage. 4. Atherosclerotic change within the carotid siphons with associated moderate multifocal stenoses as above. Electronically Signed   By: Sharlet Salina  Phill Myron M.D.   On: 02/07/2019 01:09   Mr Laqueta Jean ZO Contrast  Result Date: 02/07/2019 CLINICAL DATA:  Initial evaluation for acute syncope, fall, traumatic intracranial hemorrhage. EXAM: MRI HEAD WITHOUT AND WITH CONTRAST MRA HEAD WITHOUT CONTRAST TECHNIQUE: Multiplanar, multiecho pulse sequences of the brain and surrounding structures were obtained without and with intravenous contrast. Angiographic images of the head were obtained using MRA technique without contrast. CONTRAST:  5 cc of Gadavist. COMPARISON:  Prior CT from earlier the same day. FINDINGS: MRI HEAD FINDINGS Brain: Examination mildly degraded by motion artifact. Intraparenchymal hematoma centered at the anterior left frontal lobe again seen, measuring 3.9 x 4.1 x 5.1 cm (estimated volume 40 cc, previously 10 cc). Internal fluid fluid level. Localized edema and regional mass effect mildly increased, with partial effacement of the frontal horn of the adjacent left lateral ventricle. Mild 4 mm left-to-right midline shift. Additional small hemorrhagic contusions at the temporal poles bilaterally, measuring 17 mm on the right (series 26, image 21) and 14 mm on the left (series 26, image 19). Localized edema without significant regional mass effect. Moderate volume subarachnoid hemorrhage involving the left greater than right cerebral hemispheres with extension into the sylvian fissures. Small amount of subarachnoid seen within the interpeduncular  cistern. Small volume intraventricular hemorrhage now seen, likely related to redistribution. No hydrocephalus or ventricular trapping. Left holo hemispheric subdural hematoma measures up to 6 mm in maximal thickness at the level of the left parietal convexity. Smaller right convexity subdural hemorrhage measures up to 3 mm in maximal thickness. Mild extension along the falx noted. Underlying age-related cerebral atrophy. Patchy T2/FLAIR hyperintensity within the bilateral thalami, likely related to chronic microvascular ischemic disease, mild for age. No evidence for acute or subacute infarct. Gray-white matter differentiation otherwise maintained. No areas of chronic infarction. No mass lesion. Minimal patchy enhancement underlying the left frontal hematoma felt to be reactive in nature. No underlying discrete mass identified. No other abnormal enhancement within the brain. Pituitary gland suprasellar region normal. Midline structures intact. Vascular: Major intracranial vascular flow voids maintained. Skull and upper cervical spine: Craniocervical junction within normal limits. Postsurgical changes noted within the upper cervical spine. Bone marrow signal intensity within normal limits. Left parietal scalp contusion with superimposed 13 mm soft tissue hematoma noted. Patient status post bilateral ocular lens replacement. Paranasal sinuses are largely clear. Right concha bullosa with associated right-to-left nasal septal deviation noted. No mastoid effusion. Inner ear structures grossly normal. Sinuses/Orbits: None. Other: None. MRA HEAD FINDINGS ANTERIOR CIRCULATION: Examination mildly degraded by motion artifact. Distal cervical segments of the internal carotid arteries are widely patent with symmetric antegrade flow. Petrous, cavernous, and supraclinoid segments patent with scattered atheromatous irregularity. Probable short-segment moderate stenoses at the anterior cavernous right ICA and supraclinoid left  ICA. Apparent 3 mm outpouching arising at the region of the right ophthalmic artery, which could reflect a small aneurysm or vascular infundibulum (series 7, image 93). Finding not entirely certain on this motion degraded exam. ICA termini well perfused. A1 segments widely patent bilaterally. Normal anterior communicating artery. Anterior cerebral arteries patent to their distal aspects without stenosis. M1 segments patent bilaterally. Normal MCA bifurcations. Distal MCA branches well perfused and symmetric. No vascular abnormality seen underlying the left frontal hematoma. POSTERIOR CIRCULATION: Vertebral arteries patent to the vertebrobasilar junction without flow-limiting stenosis. Left PICA patent. Right PICA not seen. Probable short fenestration at the proximal basilar artery noted. Basilar patent to its distal aspect without stenosis. Superior cerebral arteries patent bilaterally. PCA supplied  via the basilar as well as small bilateral posterior communicating arteries. PCAs patent to their distal aspects without appreciable stenosis. IMPRESSION: MRI HEAD IMPRESSION: 1. Interval expansion of left frontal intraparenchymal hematoma, now measuring up to 40 cc in volume (previously 10 cc). Localized edema and regional mass effect mildly increased. No underlying mass lesion or other abnormality. 2. Additional small hemorrhagic contusions at the anterior temporal poles bilaterally as above. 3. Moderate volume traumatic subarachnoid hemorrhage involving the left greater than right cerebral hemispheres with small left greater than right subdural hematomas. Associated mild 4 mm left-to-right midline shift. No hydrocephalus or ventricular trapping. 4. Evolving right parietal scalp contusion. 5. Otherwise negative brain MRI. No other findings to explain intracranial hemorrhage identified. MRA HEAD IMPRESSION: 1. Technically limited exam due to motion artifact. 2. No vascular abnormality underlying the left frontal  intraparenchymal hematoma identified. 3. 3 mm focal outpouching arising from the cavernous right ICA, which could reflect a small aneurysm versus vascular infundibulum, difficult to be certain given motion artifact on this exam. Either way, finding felt to most likely be incidental in nature, and unrelated to the acute intracranial hemorrhage. 4. Atherosclerotic change within the carotid siphons with associated moderate multifocal stenoses as above. Electronically Signed   By: Rise Mu M.D.   On: 02/07/2019 01:09   Ct Abdomen Pelvis W Contrast  Result Date: 02/06/2019 CLINICAL DATA:  Blunt abdominal trauma. EXAM: CT CHEST, ABDOMEN, AND PELVIS WITH CONTRAST TECHNIQUE: Multidetector CT imaging of the chest, abdomen and pelvis was performed following the standard protocol during bolus administration of intravenous contrast. CONTRAST:  OMNIPAQUE IOHEXOL 300 MG/ML  SOLN COMPARISON:  CT chest 08/06/2017. FINDINGS: CT CHEST FINDINGS Cardiovascular: Normal heart size. No pericardial effusions. Coronary artery and mitral valve annulus calcifications. Normal caliber thoracic aorta with calcifications. No aortic dissection. Central pulmonary arteries are well opacified without evidence of significant pulmonary embolus. Mediastinum/Nodes: Esophagus is decompressed. No significant lymphadenopathy in the chest. No abnormal mediastinal gas or fluid collections. Lungs/Pleura: Atelectasis in the lung bases. No focal consolidation. No pleural effusions. No pneumothorax. Airways are patent. Musculoskeletal: Postoperative changes with lower cervical anterior fixation and right shoulder arthroplasty. Thoracolumbar scoliosis convex towards the right in the thoracic region and towards the left in the lumbar region. Diffuse degenerative changes throughout the thoracic spine. No vertebral compression deformities. Sternum and ribs appear intact. No acute displaced fractures identified. CT ABDOMEN PELVIS FINDINGS  Hepatobiliary: No hepatic injury or perihepatic hematoma. Gallbladder is unremarkable Pancreas: Unremarkable. No pancreatic ductal dilatation or surrounding inflammatory changes. Spleen: No splenic injury or perisplenic hematoma. Adrenals/Urinary Tract: No adrenal hemorrhage or renal injury identified. Bladder is unremarkable. Stomach/Bowel: Stomach, small bowel, and colon are not abnormally distended. Stool fills the colon. No wall thickening or inflammatory changes appreciated. No mesenteric hematoma or gas collections. Appendix is not identified. Vascular/Lymphatic: Aortic atherosclerosis. No enlarged abdominal or pelvic lymph nodes. Reproductive: Uterus and ovaries are not enlarged. Calcifications in the uterus likely representing calcified fibroids. Other: No free air or free fluid in the abdomen. Abdominal wall musculature appears intact. Musculoskeletal: Thoracolumbar scoliosis. No vertebral compression deformities. Degenerative changes throughout the lumbar spine with narrowed interspaces and endplate hypertrophic changes. Sacrum, pelvis, and hips appear intact. No acute fractures are identified. IMPRESSION: 1. No acute posttraumatic changes demonstrated in the chest, abdomen, or pelvis. 2. Atelectasis in the lung bases. 3. No evidence of mediastinal injury or pulmonary parenchymal injury. 4. No evidence of solid organ injury or bowel perforation. 5. Thoracolumbar scoliosis and degenerative changes. 6.  Calcified uterine fibroids. 7. Aortic atherosclerosis. Electronically Signed   By: Burman Nieves M.D.   On: 02/06/2019 20:53   Dg Chest Port 1 View  Result Date: 02/16/2019 CLINICAL DATA:  Acute respiratory distress. EXAM: PORTABLE CHEST 1 VIEW COMPARISON:  02/16/2019 FINDINGS: 1921 hours. Left lung clear. Basilar atelectasis with probable layering effusion at the right base. The cardiopericardial silhouette is within normal limits for size. Permanent pacemaker noted. Acute fractures of the posterior  right sixth, seventh, and eighth ribs noted. No pneumothorax. Status post right shoulder replacement. IMPRESSION: 1. Stable exam with right basilar atelectasis and effusion. 2. Acute fractures posterior right sixth through eighth ribs. Electronically Signed   By: Kennith Center M.D.   On: 02/16/2019 19:47   Dg Chest Port 1 View  Result Date: 02/16/2019 CLINICAL DATA:  Chest pain short of breath EXAM: PORTABLE CHEST 1 VIEW COMPARISON:  02/11/2019 FINDINGS: Dual lead pacemaker unchanged. Heart size upper normal. Negative for heart failure Small to moderate right pleural effusion similar to the prior study. Right lower lobe airspace disease unchanged. Improved aeration left lung which is now clear. IMPRESSION: Persistent right pleural effusion and right lower lobe airspace disease unchanged. Left lung base now clear. Electronically Signed   By: Marlan Palau M.D.   On: 02/16/2019 12:56   Dg Chest Port 1 View  Result Date: 02/08/2019 CLINICAL DATA:  Hypoxia.  History of diabetes. EXAM: PORTABLE CHEST 1 VIEW COMPARISON:  February 07, 2019 FINDINGS: Transcutaneous pacer leads overlie the left chest. No pneumothorax. The cardiomediastinal silhouette is normal. No pulmonary nodules or masses. No focal infiltrates. Probable atelectasis in the bases. No overt edema. IMPRESSION: No active disease. Electronically Signed   By: Gerome Sam III M.D   On: 02/08/2019 11:29   Dg Chest Port 1 View  Result Date: 02/07/2019 CLINICAL DATA:  Vomiting. Concern for aspiration. EXAM: PORTABLE CHEST 1 VIEW COMPARISON:  08/06/2017 FINDINGS: Normal heart size and pulmonary vascularity. Calcification in the mitral valve annulus. Lungs are clear. No blunting of costophrenic angles. No pneumothorax. Mediastinal contours appear intact. Calcification of the aorta. Postoperative changes in the cervical spine and right shoulder. Lumbar scoliosis convex towards the left. IMPRESSION: No evidence of active pulmonary disease. Aortic  atherosclerosis. Electronically Signed   By: Burman Nieves M.D.   On: 02/07/2019 03:35   Dg Swallowing Func-speech Pathology  Result Date: 02/11/2019 Objective Swallowing Evaluation: Type of Study: MBS-Modified Barium Swallow Study  Patient Details Name: Danielle Clarke MRN: [id number] Date of Birth: [birth year] Today's Date: 02/11/2019 Time: SLP Start Time (ACUTE ONLY): 0913 -SLP Stop Time (ACUTE ONLY): 0930 SLP Time Calculation (min) (ACUTE ONLY): 17 min Past Medical History: Past Medical History: Diagnosis Date . Aortic stenosis, mild  . Arthritis  . Asthmatic bronchitis   with colds per patient . Carotid artery disease (HCC)   40-59% bilateral ICA stenosis . Cataract  . Cutaneous abscess of right foot  . Glaucoma  . Heart murmur   Dr Katrinka Blazing is her  cardiologist.  . Hyperlipemia  . Hypertension   Dr. Katrinka Blazing ~ 2 years ago . Neck fracture [facility])   july 2013 . Osteoporosis  . Syncope  . Type 1 diabetes mellitus (HCC)  Past Surgical History: Past Surgical History: Procedure Laterality Date . ANKLE FRACTURE SURGERY Left 2001  steel plate and 3 screws  . ANTERIOR CERVICAL CORPECTOMY N/A 07/31/2014  Procedure: Cervical Four to Cervical Six Corpectomy;  Surgeon: Karn Cassis, MD;  Location: [facility]UR[facility] OR[facility];  Service: Neurosurgery;  Laterality: N/A;  C4 to C6 Corpectomy . BREAST SURGERY  1988 . CATARACT EXTRACTION W/ INTRAOCULAR LENS  IMPLANT, BILATERAL Bilateral 2011  right and then left . EYE SURGERY   . FRACTURE SURGERY   . HAMMER TOE SURGERY  1998 . I&D EXTREMITY Right 09/27/2018  Procedure: IRRIGATION AND DEBRIDEMENT RIGHT FOOT;  Surgeon: Nadara Mustard, MD;  Location: [facility] OR;  Service: Orthopedics;  Laterality: Right; . JOINT REPLACEMENT   . PACEMAKER IMPLANT N/A 02/10/2019  Procedure: PACEMAKER IMPLANT;  Surgeon: Marinus Maw, MD;  Location: [facility] INVASIVE CV LAB;  Service: Cardiovascular;  Laterality: N/A; . TONSILLECTOMY AND ADENOIDECTOMY  1948 . TOTAL SHOULDER REPLACEMENT  2010  right shoulder  . TUBAL LIGATION   1979 . TYMPANOSTOMY TUBE PLACEMENT Bilateral  HPI: Danielle Clarke is a 78 y.o. female with history of diabetes mellitus type 1, asthmatic bronchitis, neck fx 2013, hyperlipidemia was brought to the ER after syncopal episode at the dentist office. Had some difficulty talking. CT head showed left frontal intraparenchymal hematoma and subarachnoid and subdural hematoma in both convexities (small hemorrhagic contusions at the anterior temporal poles bilaterally). Pt had coffee-ground emesis, nause/vomiting which has resolved and GI has signed off. Reported history of syncope ascribed to swallowing and coughing.  Subjective: alert Assessment / Plan / Recommendation CHL IP CLINICAL IMPRESSIONS 02/11/2019 Clinical Impression Pt exhibits a mild-moderate oropharyngeal dysphagia marked by laryngeal penetration with thin and nectar. Orally, she demonstrated delays in transit and bolus cohesion and fluidity of transit to pharynx. Pt unable to transit solid bolus into hypopharynx without thin liquid to assist accompanied by episodes of gagging. Barium entered nasal cavity for several seconds x 1 before exiting and swallow initiated. Epiglottic inversion was incomplete without retroflexion and mistiming of laryngeal closure allowing thin and nectar thick barium to reach just above the vocal cords. She was sensate to majority of penetrates, clearing throat reflexively to inconsistently clear penetrates. Chin tuck was ineffective to eliminate or decrease penetration. Decreased epiglottic inversion and contraction led to mild vallecular and pyriform sinus residue. Given effort required for regular texture solids to transit pharynx recommend initiation of full liquid diet, no straws, small sips, multiple swallows, throat clears throughout meals and crush pills. ST will continue.      SLP Visit Diagnosis Dysphagia, oropharyngeal phase (R13.12) Attention and concentration deficit following -- Frontal lobe and executive function  deficit following -- Impact on safety and function Moderate aspiration risk   CHL IP TREATMENT RECOMMENDATION 02/11/2019 Treatment Recommendations Therapy as outlined in treatment plan below   Prognosis 02/11/2019 Prognosis for Safe Diet Advancement Good Barriers to Reach Goals -- Barriers/Prognosis Comment -- CHL IP DIET RECOMMENDATION 02/11/2019 SLP Diet Recommendations Thin liquid;Other (Comment) Liquid Administration via Cup;No straw Medication Administration Crushed with puree Compensations Small sips/bites;Slow rate;Multiple dry swallows after each bite/sip;Clear throat intermittently Postural Changes Seated upright at 90 degrees   CHL IP OTHER RECOMMENDATIONS 02/11/2019 Recommended Consults -- Oral Care Recommendations Oral care BID Other Recommendations --   CHL IP FOLLOW UP RECOMMENDATIONS 02/11/2019 Follow up Recommendations Home health SLP   CHL IP FREQUENCY AND DURATION 02/11/2019 Speech Therapy Frequency (ACUTE ONLY) min 2x/week Treatment Duration 2 weeks      CHL IP ORAL PHASE 02/11/2019 Oral Phase Impaired Oral - Pudding Teaspoon -- Oral - Pudding Cup -- Oral - Honey Teaspoon -- Oral - Honey Cup -- Oral - Nectar Teaspoon -- Oral - Nectar Cup Decreased bolus cohesion Oral - Nectar Straw -- Oral - Thin Teaspoon -- Oral -  Thin Cup Decreased bolus cohesion Oral - Thin Straw Decreased bolus cohesion Oral - Puree Reduced posterior propulsion Oral - Mech Soft -- Oral - Regular Reduced posterior propulsion Oral - Multi-Consistency -- Oral - Pill -- Oral Phase - Comment --  CHL IP PHARYNGEAL PHASE 02/11/2019 Pharyngeal Phase Impaired Pharyngeal- Pudding Teaspoon -- Pharyngeal -- Pharyngeal- Pudding Cup -- Pharyngeal -- Pharyngeal- Honey Teaspoon -- Pharyngeal -- Pharyngeal- Honey Cup -- Pharyngeal -- Pharyngeal- Nectar Teaspoon -- Pharyngeal -- Pharyngeal- Nectar Cup Penetration/Aspiration during swallow;Reduced epiglottic inversion;Pharyngeal residue - valleculae;Pharyngeal residue - pyriform Pharyngeal Material  enters airway, remains ABOVE vocal cords and not ejected out Pharyngeal- Nectar Straw -- Pharyngeal -- Pharyngeal- Thin Teaspoon -- Pharyngeal -- Pharyngeal- Thin Cup Penetration/Aspiration during swallow;Pharyngeal residue - valleculae;Pharyngeal residue - pyriform;Reduced epiglottic inversion;Reduced airway/laryngeal closure Pharyngeal Material enters airway, remains ABOVE vocal cords and not ejected out Pharyngeal- Thin Straw Pharyngeal residue - pyriform;Pharyngeal residue - valleculae;Penetration/Aspiration during swallow;Reduced airway/laryngeal closure;Reduced epiglottic inversion Pharyngeal Material enters airway, remains ABOVE vocal cords and not ejected out Pharyngeal- Puree WFL Pharyngeal -- Pharyngeal- Mechanical Soft -- Pharyngeal -- Pharyngeal- Regular (No Data) Pharyngeal -- Pharyngeal- Multi-consistency -- Pharyngeal -- Pharyngeal- Pill -- Pharyngeal -- Pharyngeal Comment --  CHL IP CERVICAL ESOPHAGEAL PHASE 02/11/2019 Cervical Esophageal Phase WFL Pudding Teaspoon -- Pudding Cup -- Honey Teaspoon -- Honey Cup -- Nectar Teaspoon -- Nectar Cup -- Nectar Straw -- Thin Teaspoon -- Thin Cup -- Thin Straw -- Puree -- Mechanical Soft -- Regular -- Multi-consistency -- Pill -- Cervical Esophageal Comment -- Royce Macadamia 02/11/2019, 11:25 AM  Breck Coons [employer] administrator Pager [phone] Office [phone]             Vas US Carotid  Result Date: 02/10/2019 Carotid Arterial Duplex Study Indications:       Syncope. Limitations:       constant movement Comparison Study:  Prior study from 01/05/17 is available for comparison. Performing Technologist: Sherren Kerns RVS  Examination Guidelines: A complete evaluation includes B-mode imaging, spectral Doppler, color Doppler, and power Doppler as needed of all accessible portions of each vessel. Bilateral testing is considered an integral part of a complete examination. Limited examinations for reoccurring indications may be  performed as noted.  Right Carotid Findings: +----------+--------+--------+--------+------------+------------------+           PSV cm/sEDV cm/sStenosisDescribe    Comments           +----------+--------+--------+--------+------------+------------------+ CCA Prox  107     14                          intimal thickening +----------+--------+--------+--------+------------+------------------+ CCA Distal95      16                          intimal thickening +----------+--------+--------+--------+------------+------------------+ ICA Prox  91      20              heterogenous                   +----------+--------+--------+--------+------------+------------------+ ICA Distal113     24                                             +----------+--------+--------+--------+------------+------------------+ ECA       173     9  calcific                       +----------+--------+--------+--------+------------+------------------+ +----------+--------+-------+--------+-------------------+           PSV cm/sEDV cmsDescribeArm Pressure (mmHG) +----------+--------+-------+--------+-------------------+ [id number]                                        +----------+--------+-------+--------+-------------------+ +---------+--------+--------+----------------------------------+ VertebralPSV cm/sEDV cm/sNot assessed and constant movement +---------+--------+--------+----------------------------------+  Left Carotid Findings: +----------+--------+--------+--------+-----------+------------------+           PSV cm/sEDV cm/sStenosisDescribe   Comments           +----------+--------+--------+--------+-----------+------------------+ CCA Prox  96      15                         intimal thickening +----------+--------+--------+--------+-----------+------------------+ CCA Distal84      17                         intimal thickening  +----------+--------+--------+--------+-----------+------------------+ ICA Prox  78      19              homogeneous                   +----------+--------+--------+--------+-----------+------------------+ ICA Distal90      20                                            +----------+--------+--------+--------+-----------+------------------+ ECA       296     9               calcific                      +----------+--------+--------+--------+-----------+------------------+ +----------+--------+--------+--------+-------------------+ SubclavianPSV cm/sEDV cm/sDescribeArm Pressure (mmHG) +----------+--------+--------+--------+-------------------+           138                                         +----------+--------+--------+--------+-------------------+ +---------+--------+---+--------+--+ VertebralPSV cm/s135EDV cm/s22 +---------+--------+---+--------+--+  Summary: Right Carotid: Velocities in the right ICA are consistent with a 1-39% stenosis. Left Carotid: Velocities in the left ICA are consistent with a 1-39% stenosis. Vertebrals:  Left vertebral artery demonstrates antegrade flow. Subclavians: Normal flow hemodynamics were seen in bilateral subclavian              arteries. *See table(s) above for measurements and observations.  Electronically signed by Gretta Began MD on 02/10/2019 at 7:42:06 AM.    Final    Vas [place] Lower Extremity Venous (dvt)  Result Date: 02/17/2019  Lower Venous Study Indications: Elevated d dimer.  Limitations: Patient pain tolerance. Performing Technologist: Blanch Media RVS  Examination Guidelines: A complete evaluation includes B-mode imaging, spectral Doppler, color Doppler, and power Doppler as needed of all accessible portions of each vessel. Bilateral testing is considered an integral part of a complete examination. Limited examinations for reoccurring indications may be performed as noted.  Right Venous Findings:  +---------+---------------+---------+-----------+----------+-------------------+          CompressibilityPhasicitySpontaneityPropertiesSummary             +---------+---------------+---------+-----------+----------+-------------------+ CFV      Full  Yes      Yes                                      +---------+---------------+---------+-----------+----------+-------------------+ SFJ      Full                                                             +---------+---------------+---------+-----------+----------+-------------------+ FV Prox  Full                                                             +---------+---------------+---------+-----------+----------+-------------------+ FV Mid   Full                                                             +---------+---------------+---------+-----------+----------+-------------------+ FV Distal               Yes      Yes                  unable to compress                                                        due to patient pain                                                       tolerance           +---------+---------------+---------+-----------+----------+-------------------+ PFV      Full                                                             +---------+---------------+---------+-----------+----------+-------------------+ POP      Full           Yes      Yes                                      +---------+---------------+---------+-----------+----------+-------------------+ PTV      Full                                                             +---------+---------------+---------+-----------+----------+-------------------+  PERO     Full                                                             +---------+---------------+---------+-----------+----------+-------------------+  Left Venous Findings:  +---------+---------------+---------+-----------+----------+-------+          CompressibilityPhasicitySpontaneityPropertiesSummary +---------+---------------+---------+-----------+----------+-------+ CFV      Full           Yes      Yes                          +---------+---------------+---------+-----------+----------+-------+ SFJ      Full                                                 +---------+---------------+---------+-----------+----------+-------+ FV Prox  Full                                                 +---------+---------------+---------+-----------+----------+-------+ FV Mid   Full                                                 +---------+---------------+---------+-----------+----------+-------+ FV DistalFull                                                 +---------+---------------+---------+-----------+----------+-------+ PFV      Full                                                 +---------+---------------+---------+-----------+----------+-------+ POP      Full           Yes      Yes                          +---------+---------------+---------+-----------+----------+-------+ PTV      Full                                                 +---------+---------------+---------+-----------+----------+-------+ PERO     Full                                                 +---------+---------------+---------+-----------+----------+-------+    Summary: Right: There is no evidence of deep vein thrombosis in the lower extremity. No cystic structure found in the popliteal fossa. Left: There is no evidence of deep vein thrombosis in  the lower extremity. No cystic structure found in the popliteal fossa.  *See table(s) above for measurements and observations. Electronically signed by Coral Else MD on 02/17/2019 at 8:39:03 AM.    Final     Scheduled Meds: . blood pressure control book   Does not apply Once  . feeding supplement  (GLUCERNA SHAKE)  237 mL Oral TID BM  . feeding supplement (PRO-STAT SUGAR FREE 64)  30 mL Oral BID  . insulin aspart  0-15 Units Subcutaneous Q4H  . [START ON 02/23/2019] insulin glargine  8 Units Subcutaneous Daily  . lidocaine  1 patch Transdermal Q24H  . living well with diabetes book   Does not apply Once  . metFORMIN  500 mg Oral Q breakfast  . metoprolol tartrate  25 mg Oral BID  . nitrofurantoin (macrocrystal-monohydrate)  100 mg Oral Q12H  . pantoprazole sodium  40 mg Oral BID   Continuous Infusions:

## 2019-02-22 NOTE — Progress Notes (Deleted)
[place] PHYSICAL MEDICINE & REHABILITATION PROGRESS NOTE  Subjective/Complaints: No complaints alert but not oriented this am  ROS: aphasia  Objective: Vital Signs: Blood pressure (!) 164/46, pulse 80, temperature 99 F (37.2 C), temperature source Oral, resp. rate 18, height 5' (1.524 m), weight 48.2 kg, SpO2 98 %. No results found. Recent Labs    02/19/19 1453  WBC 12.2*  HGB 9.8*  HCT 30.1*  PLT 284   No results for input(s): NA, K, CL, CO2, GLUCOSE, BUN, CREATININE, CALCIUM in the last 72 hours.  Physical Exam: BP (!) 164/46   Pulse 80   Temp 99 F (37.2 C) (Oral)   Resp 18   Ht 5' (1.524 m)   Wt 48.2 kg   SpO2 98%   BMI 20.75 kg/m  Constitutional: No distress . Vital signs reviewed. HENT: Normocephalic.  Atraumatic. Eyes: EOMI. No discharge. Cardiovascular: RRR.  No JVD.  + Murmur Respiratory: CTA bilaterally.  Normal effort. GI: BS +. Non-distended. Musc: No edema or tenderness in extremities. Neurological:alert , follow simple commands Slow to process.  Motor: Grossly 4/5 throughout, unchanged Skin: Skin iswarmand dry.  Psychiatric:Confused.  Assessment/Plan: 1. Functional deficits secondary to traumatic left frontal intraparenchymal hematoma which require 3+ hours per day of interdisciplinary therapy in a comprehensive inpatient rehab setting.  Physiatrist is providing close team supervision and 24 hour management of active medical problems listed below.  Physiatrist and rehab team continue to assess barriers to discharge/monitor patient progress toward functional and medical goals  Care Tool:  Bathing    Body parts bathed by patient: Right arm, Left arm, Chest, Abdomen, Face, Right upper leg, Left upper leg   Body parts bathed by helper: Buttocks     Bathing assist Assist Level: Moderate Assistance - Patient 50 - 74%     Upper Body Dressing/Undressing Upper body dressing   What is the patient wearing?: Pull over shirt    Upper body  assist Assist Level: Minimal Assistance - Patient > 75%    Lower Body Dressing/Undressing Lower body dressing      What is the patient wearing?: Underwear/pull up, Incontinence brief     Lower body assist Assist for lower body dressing: Moderate Assistance - Patient 50 - 74%     Toileting Toileting    Toileting assist Assist for toileting: Moderate Assistance - Patient 50 - 74%     Transfers Chair/bed transfer  Transfers assist     Chair/bed transfer assist level: Minimal Assistance - Patient > 75%     Locomotion Ambulation   Ambulation assist      Assist level: Minimal Assistance - Patient > 75% Assistive device: Walker-rolling Max distance: 15'   Walk 10 feet activity   Assist     Assist level: Minimal Assistance - Patient > 75% Assistive device: Walker-rolling   Walk 50 feet activity   Assist Walk 50 feet with 2 turns activity did not occur: Safety/medical concerns         Walk 150 feet activity   Assist Walk 150 feet activity did not occur: Safety/medical concerns         Walk 10 feet on uneven surface  activity   Assist Walk 10 feet on uneven surfaces activity did not occur: Safety/medical concerns         Wheelchair     Assist Will patient use wheelchair at discharge?: (TBD) Type of Wheelchair: Manual    Wheelchair assist level: Supervision/Verbal cueing Max wheelchair distance: 100'    Wheelchair 50  feet with 2 turns activity    Assist        Assist Level: Supervision/Verbal cueing   Wheelchair 150 feet activity     Assist Wheelchair 150 feet activity did not occur: Safety/medical concerns          Medical Problem List and Plan: 1.Decreased functional mobilitysecondary to traumatic left frontal intraparenchymal hematoma  Continue CIR PT, OT, SLP 2. Antithrombotics: -DVT/anticoagulation:SCDs -antiplatelet therapy: None secondary to intraparenchymal hematoma 3. Pain  Management:Lidoderm patch 4. Mood:Provide emotional support -antipsychotic agents: none 5. Neuropsych: This patientis not fully capable of making decisions on herown behalf. 6. Skin/Wound Care:Routine skin checks 7. Fluids/Electrolytes/Nutrition:Routine in and out's 8. 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 Follow-up per EP  9. Moderate severe aortic stenosis. Continue Lopressor 25 mg twice a day -follow for tolerance of physical activity with therapies 10. Dysphagia. Currently on a clear liquid diet.   Advance diet as tolerated  D/c IVF, improving fluid intake 11. Intermittent coffee-ground emesis. 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 Continue PPI.  Follow-up gastroenterology services currently with conservative care.  Hemoglobin 9.8 on 3/25  Labs ordered for Monday  Continue to monitor 12. Diabetes mellitus  Patient had been on Tresiba 10 units daily prior to admission.  Lantus insulin10 units twice daily, decreased to daily on 3/24.  Check blood sugars before meals and at bedtime.  CBG (last 3)  Recent Labs    02/22/19 0428 02/22/19 0651 02/22/19 1145  GLUCAP 271* 75 >600*  Stat serum glucose given that values have not run that high 13. Hypoalbuminemia  Supplement initiated on 3/23 14.  Right pleural effusion  Continue to monitor 15.  Rib fractures  Right 6-8  Conservative care 16.  Leukocytosis  WBCs 12.2 on 3/25  Labs ordered for Monday  Low-grade fever intermittent  UA equivocal, urine culture with multiple species  Empiric Macrobid started on 3/26 17.  Labile blood pressure  Labile on 3/27, monitor for trend 18.  Lethargy  Multifactorial- TBI +/- UTI  See #16  Will consider repeat CT if decline in status,- appears to be improving slowly  LOS: 8 days A FACE TO FACE EVALUATION WAS PERFORMED  Erick Colace 02/22/2019, 7:42 AM

## 2019-02-23 ENCOUNTER — Inpatient Hospital Stay (HOSPITAL_COMMUNITY): Payer: Medicare Other | Admitting: Occupational Therapy

## 2019-02-23 ENCOUNTER — Inpatient Hospital Stay (HOSPITAL_COMMUNITY): Payer: Medicare Other | Admitting: Speech Pathology

## 2019-02-23 ENCOUNTER — Inpatient Hospital Stay (HOSPITAL_COMMUNITY): Payer: Medicare Other | Admitting: Physical Therapy

## 2019-02-23 LAB — GLUCOSE, CAPILLARY
Glucose-Capillary: 108 mg/dL — ABNORMAL HIGH (ref 70–99)
Glucose-Capillary: 132 mg/dL — ABNORMAL HIGH (ref 70–99)
Glucose-Capillary: 145 mg/dL — ABNORMAL HIGH (ref 70–99)
Glucose-Capillary: 277 mg/dL — ABNORMAL HIGH (ref 70–99)
Glucose-Capillary: 309 mg/dL — ABNORMAL HIGH (ref 70–99)
Glucose-Capillary: 94 mg/dL (ref 70–99)

## 2019-02-23 NOTE — Progress Notes (Signed)
Inpatient Diabetes Program Recommendations  AACE/ADA: New Consensus Statement on Inpatient Glycemic Control   Target Ranges:  Prepandial:   less than 140 mg/dL      Peak postprandial:   less than 180 mg/dL (1-2 hours)      Critically ill patients:  140 - 180 mg/dL   Results for MALAYJA, NICOSIA (MRN [id number]) as of 02/23/2019 09:03  Ref. Range 02/22/2019 04:28 02/22/2019 06:51 02/22/2019 11:45 02/22/2019 15:03 02/22/2019 16:38 02/22/2019 19:54 02/22/2019 23:57 02/23/2019 03:55 02/23/2019 07:58  Glucose-Capillary Latest Ref Range: 70 - 99 mg/dL 622 (H) 75 >297 (HH) 989 (HH) 308 (H) 140 (H) 259 (H) 94 277 (H)   Review of Glycemic Control Diabetes history: DM1 (makes NO insulin; requires basal, correction, and meal coverage insulin) Outpatient Diabetes medications: Tresiba 10 units daily, Humalog 2-13 units QID Current orders for Inpatient glycemic control:Lantus 14 units daily, Novolog 0-15 units Q4H, Metformin 500 mg QAM  Inpatient Diabetes Program Recommendations:   Insulin-Basal: In reviewing chart, noted patient did NOT receive any basal insulin on 02/22/19 (charted as NOT GIVEN with reason as medication discontinued). Which is partly why glucose got so high on 02/22/19 (up to 651 mg/dl) since patient has Type 1 diabetes and makes NO insulin at all.  Patient has already received Lantus 14 units this morning.  Insulin-Correction: Please consider decreasing Novolog correction to sensitive scale 0-9 units Q4H.  Insulin-Meal Coverage: Since patient has Type 1 DM, she can not make any insulin and will require insulin coverage for all carbohydrates consumed (in food and fluids).  Please consider ordering Novolog 4 units TID with meals for meal coverage if patient eats at least 50% of meals.  NOTE: Noted consult for Diabetes Coordinator. Diabetes Coordinator is not on campus over the weekend but available by pager from 8am to 5pm for questions or concerns. Chart reviewed.  Thanks, Orlando Penner, RN,  MSN, CDE Diabetes Coordinator Inpatient Diabetes Program [phone] (Team Pager from 8am to 5pm)

## 2019-02-23 NOTE — Progress Notes (Signed)
[place] PHYSICAL MEDICINE & REHABILITATION PROGRESS NOTE  Subjective/Complaints: Appreciate hosptialist assist, po intake varies 50-100% ROS: Aphasic but denies CP, SOB, N/V/D Objective: Vital Signs: Blood pressure (!) 147/49, pulse 78, temperature 98.3 F (36.8 C), temperature source Oral, resp. rate 18, height 5' (1.524 m), weight 48.1 kg, SpO2 96 %. No results found. No results for input(s): WBC, HGB, HCT, PLT in the last 72 hours. Recent Labs    02/22/19 1217 02/22/19 1707  NA  --  136  K  --  3.3*  CL  --  98  CO2  --  26  GLUCOSE 651* 195*  BUN  --  30*  CREATININE  --  0.88  CALCIUM  --  9.0    Physical Exam: BP (!) 147/49 (BP Location: Right Arm)   Pulse 78   Temp 98.3 F (36.8 C) (Oral)   Resp 18   Ht 5' (1.524 m)   Wt 48.1 kg   SpO2 96%   BMI 20.72 kg/m  Constitutional: No distress . Vital signs reviewed. HENT: Normocephalic.  Atraumatic. Eyes: EOMI. No discharge. Cardiovascular: Tachycardia  No JVD.  + Murmur Respiratory: CTA bilaterally.  Normal effort. GI: BS +. Non-distended. Musc: No edema or tenderness in extremities.rib pain with sup to sit Neurological:Somnolent Slow to process.  Motor: Grossly 4/5 throughout, unchanged Skin: Skin iswarmand dry.  Psychiatric:Confused.  Assessment/Plan: 1. Functional deficits secondary to traumatic left frontal intraparenchymal hematoma which require 3+ hours per day of interdisciplinary therapy in a comprehensive inpatient rehab setting.  Physiatrist is providing close team supervision and 24 hour management of active medical problems listed below.  Physiatrist and rehab team continue to assess barriers to discharge/monitor patient progress toward functional and medical goals  Care Tool:  Bathing    Body parts bathed by patient: Right arm, Left arm, Chest, Abdomen, Face, Right upper leg, Left upper leg   Body parts bathed by helper: Buttocks     Bathing assist Assist Level: Moderate Assistance  - Patient 50 - 74%     Upper Body Dressing/Undressing Upper body dressing   What is the patient wearing?: Pull over shirt    Upper body assist Assist Level: Minimal Assistance - Patient > 75%    Lower Body Dressing/Undressing Lower body dressing      What is the patient wearing?: Pants, Incontinence brief     Lower body assist Assist for lower body dressing: Dependent - Patient 0%     Toileting Toileting    Toileting assist Assist for toileting: Moderate Assistance - Patient 50 - 74%     Transfers Chair/bed transfer  Transfers assist     Chair/bed transfer assist level: Moderate Assistance - Patient 50 - 74%     Locomotion Ambulation   Ambulation assist      Assist level: Contact Guard/Touching assist Assistive device: Walker-rolling Max distance: 30'   Walk 10 feet activity   Assist     Assist level: Contact Guard/Touching assist Assistive device: Walker-rolling   Walk 50 feet activity   Assist Walk 50 feet with 2 turns activity did not occur: Safety/medical concerns         Walk 150 feet activity   Assist Walk 150 feet activity did not occur: Safety/medical concerns         Walk 10 feet on uneven surface  activity   Assist Walk 10 feet on uneven surfaces activity did not occur: Safety/medical concerns         Wheelchair  Assist Will patient use wheelchair at discharge?: (TBD) Type of Wheelchair: Manual    Wheelchair assist level: Supervision/Verbal cueing Max wheelchair distance: 100'    Wheelchair 50 feet with 2 turns activity    Assist        Assist Level: Supervision/Verbal cueing   Wheelchair 150 feet activity     Assist Wheelchair 150 feet activity did not occur: Safety/medical concerns          Medical Problem List and Plan: 1.Decreased functional mobilitysecondary to traumatic left frontal intraparenchymal hematoma   CIR PT, OT, SLP 2.  Antithrombotics: -DVT/anticoagulation:SCDs -antiplatelet therapy: None secondary to intraparenchymal hematoma 3. Pain Management:Lidoderm patch 4. Mood:Provide emotional support -antipsychotic agents: none 5. Neuropsych: This patientis not fully capable of making decisions on herown behalf. 6. Skin/Wound Care:Routine skin checks 7. Fluids/Electrolytes/Nutrition:Routine in and out's 8. 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 Follow-up per EP  9. Moderate severe aortic stenosis. Continue Lopressor 25 mg twice a day -follow for tolerance of physical activity with therapies 10. Dysphagia  Advance diet as tolerated  D/c IVF, improving fluid intake 11. 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 Continue PPI.  Follow-up gastroenterology services currently with conservative care.  Hemoglobin 9.8 on 3/25  Labs ordered for Monday  Continue to monitor 12. Diabetes mellitus  Patient had been on Tresiba 10 units daily prior to admission.  Lantus insulin10 units twice daily, decreased to daily on 3/24.  Check blood sugars before meals and at bedtime.    CBG (last 3)  Recent Labs    02/22/19 1954 02/22/19 2357 02/23/19 0355  GLUCAP 140* 259* 94  Labile  CBG - appreciate hospitalist assist increased Lantus Add am dose metformin GFR normal CBG elevated in 500-600 range on 3/28 requiring Novalog 20U x 2 13. Hypoalbuminemia  Supplement initiated on 3/23 14.  Right pleural effusion  Continue to monitor 15.  Rib fractures  Right 6-8  Conservative care 16.  Leukocytosis  WBCs 12.2 on 3/25  Labs ordered for Monday  Low-grade fever resolved    Off macrobid 17.  Labile blood pressure   Vitals:   02/22/19 2002 02/23/19 0357  BP: (!) 133/39 (!) 147/49  Pulse: 83 78  Resp:  18  Temp:  98.3 F (36.8 C)  SpO2:  96%  elevated systolic BP on  lopressor 25mg  BID on exam HR is elevated ~120 but regular , may need to increase on Norvasc at home will restart if BP start creeping up to >150 18.  Hx Lethargy- alert this am- aphasia  Multifactorial- TBI, Urine cx neg d/c macrobid   Will consider repeat CT if decline in status,- appears to be improving slowly  LOS: 9 days A FACE TO FACE EVALUATION WAS PERFORMED  Erick Colace 02/23/2019, 7:34 AM

## 2019-02-23 NOTE — Progress Notes (Signed)
Occupational Therapy Session Note  Patient Details  Name: Danielle Clarke MRN: [id number] Date of Birth: Jan 03, 1941  Today's Date: 02/23/2019 OT Individual Time: 1015-1045 OT Individual Time Calculation (min): 30 min    Short Term Goals: Week 2:  OT Short Term Goal 1 (Week 2): Pt will be able to self cleanse after toileting with min A. OT Short Term Goal 2 (Week 2): Pt will complete toilet transfer with S recalling safe transfer steps to demonstrate improved cognition. OT Short Term Goal 3 (Week 2): Pt will tolerate standing grooming task for 2 minutes  Skilled Therapeutic Interventions/Progress Updates:    Pt seen for OT session focusing on ADL re-training, functional ambulation and orientation. Pt asleep in supine upon arrival, easily awoken however required increased time and stimuli for arousal.  Throughout session, pt required significantly increased time and encouragement to complete all tasks, constantly stating "just give me a minute". She transferred to sitting EOB with mod A using hospital bed functions. Pt orientated to "hospital" and "something happened at the dentist office". Re-oriented to time, situation and purpose of therapy. Pt more agreeable to tx session once oriented. She voiced need for toileting task, ambulated with CGA to bathroom with assist to manage IV pole. Mod A for clothing management. Pt unaware of incontinent episode and soiled brief. She declined attempt at self-hygiene following small BM. Total A to thread on pants and pt able to pull pants up with CGA. With lots of encouragement and education regarding benefits of OOB, pt agreeable to stay sitting up in recliner at end of session until after lunch. Pt left seated in recliner with all needs in reach and chair belt alarm on.   Therapy Documentation Precautions:  Precautions Precautions: Fall, ICD/Pacemaker Restrictions Weight Bearing Restrictions: No LUE Weight Bearing: Weight bearing as  tolerated Pain:   No/denies pain   Therapy/Group: Individual Therapy  Lesli Issa L 02/23/2019, 6:44 AM

## 2019-02-24 ENCOUNTER — Inpatient Hospital Stay (HOSPITAL_COMMUNITY): Payer: Medicare Other | Admitting: Occupational Therapy

## 2019-02-24 ENCOUNTER — Inpatient Hospital Stay (HOSPITAL_COMMUNITY): Payer: Medicare Other | Admitting: Physical Therapy

## 2019-02-24 ENCOUNTER — Inpatient Hospital Stay (HOSPITAL_COMMUNITY): Payer: Medicare Other

## 2019-02-24 DIAGNOSIS — S06340S Traumatic hemorrhage of right cerebrum without loss of consciousness, sequela: Secondary | ICD-10-CM

## 2019-02-24 DIAGNOSIS — E876 Hypokalemia: Secondary | ICD-10-CM

## 2019-02-24 LAB — CBC WITH DIFFERENTIAL/PLATELET
Abs Immature Granulocytes: 0.03 10*3/uL (ref 0.00–0.07)
Basophils Absolute: 0.1 10*3/uL (ref 0.0–0.1)
Basophils Relative: 1 %
Eosinophils Absolute: 0.1 10*3/uL (ref 0.0–0.5)
Eosinophils Relative: 2 %
HCT: 32.5 % — ABNORMAL LOW (ref 36.0–46.0)
Hemoglobin: 10.8 g/dL — ABNORMAL LOW (ref 12.0–15.0)
Immature Granulocytes: 0 %
Lymphocytes Relative: 15 %
Lymphs Abs: 1.1 10*3/uL (ref 0.7–4.0)
MCH: 29.6 pg (ref 26.0–34.0)
MCHC: 33.2 g/dL (ref 30.0–36.0)
MCV: 89 fL (ref 80.0–100.0)
Monocytes Absolute: 0.4 10*3/uL (ref 0.1–1.0)
Monocytes Relative: 5 %
Neutro Abs: 5.9 10*3/uL (ref 1.7–7.7)
Neutrophils Relative %: 77 %
Platelets: 405 10*3/uL — ABNORMAL HIGH (ref 150–400)
RBC: 3.65 MIL/uL — ABNORMAL LOW (ref 3.87–5.11)
RDW: 14.1 % (ref 11.5–15.5)
WBC: 7.6 10*3/uL (ref 4.0–10.5)
nRBC: 0 % (ref 0.0–0.2)

## 2019-02-24 LAB — GLUCOSE, CAPILLARY
Glucose-Capillary: 103 mg/dL — ABNORMAL HIGH (ref 70–99)
Glucose-Capillary: 187 mg/dL — ABNORMAL HIGH (ref 70–99)
Glucose-Capillary: 370 mg/dL — ABNORMAL HIGH (ref 70–99)
Glucose-Capillary: 206 mg/dL — ABNORMAL HIGH (ref 70–99)
Glucose-Capillary: 166 mg/dL — ABNORMAL HIGH (ref 70–99)

## 2019-02-24 NOTE — Progress Notes (Signed)
[facility] PHYSICAL MEDICINE & REHABILITATION PROGRESS NOTE  Subjective/Complaints: Patient seen sitting at the edge of her bed working with therapies this morning.  She states she slept well overnight.  She states he had a good weekend.  ROS: Denies CP, shortness of breath, nausea, vomiting, diarrhea.  Objective: Vital Signs: Blood pressure (!) 168/65, pulse 81, temperature 98 F (36.7 C), temperature source Oral, resp. rate 18, height 5' (1.524 m), weight 48.1 kg, SpO2 97 %. No results found. Recent Labs    02/24/19 0803  WBC 7.6  HGB 10.8*  HCT 32.5*  PLT 405*   Recent Labs    02/22/19 1217 02/22/19 1707  NA  --  136  K  --  3.3*  CL  --  98  CO2  --  26  GLUCOSE 651* 195*  BUN  --  30*  CREATININE  --  0.88  CALCIUM  --  9.0    Physical Exam: BP (!) 168/65 (BP Location: Right Arm)   Pulse 81   Temp 98 F (36.7 C) (Oral)   Resp 18   Ht 5' (1.524 m)   Wt 48.1 kg   SpO2 97%   BMI 20.73 kg/m  Constitutional: No distress . Vital signs reviewed. HENT: Normocephalic.  Atraumatic. Eyes: EOMI. No discharge. Cardiovascular: RRR.  No JVD.  + Murmur Respiratory: CTA bilaterally.  Normal effort. GI: BS +. Non-distended. Musc: No edema or tenderness in extremities. Neurological: Alert and oriented x3, except for date of month Slow to process.  Motor: Grossly 4-4+/5 throughout Skin: Skin iswarmand dry.  Psychiatric: Confused.  Assessment/Plan: 1. Functional deficits secondary to traumatic left frontal intraparenchymal hematoma which require 3+ hours per day of interdisciplinary therapy in a comprehensive inpatient rehab setting.  Physiatrist is providing close team supervision and 24 hour management of active medical problems listed below.  Physiatrist and rehab team continue to assess barriers to discharge/monitor patient progress toward functional and medical goals  Care Tool:  Bathing    Body parts bathed by patient: Right arm, Left arm, Chest, Abdomen,  Face, Right upper leg, Left upper leg   Body parts bathed by helper: Buttocks     Bathing assist Assist Level: Moderate Assistance - Patient 50 - 74%     Upper Body Dressing/Undressing Upper body dressing   What is the patient wearing?: Pull over shirt    Upper body assist Assist Level: Minimal Assistance - Patient > 75%    Lower Body Dressing/Undressing Lower body dressing      What is the patient wearing?: Pants, Incontinence brief     Lower body assist Assist for lower body dressing: Maximal Assistance - Patient 25 - 49%     Toileting Toileting    Toileting assist Assist for toileting: Moderate Assistance - Patient 50 - 74%     Transfers Chair/bed transfer  Transfers assist     Chair/bed transfer assist level: Moderate Assistance - Patient 50 - 74%     Locomotion Ambulation   Ambulation assist      Assist level: Contact Guard/Touching assist Assistive device: Walker-rolling Max distance: 30'   Walk 10 feet activity   Assist     Assist level: Contact Guard/Touching assist Assistive device: Walker-rolling   Walk 50 feet activity   Assist Walk 50 feet with 2 turns activity did not occur: Safety/medical concerns         Walk 150 feet activity   Assist Walk 150 feet activity did not occur: Safety/medical concerns  Walk 10 feet on uneven surface  activity   Assist Walk 10 feet on uneven surfaces activity did not occur: Safety/medical concerns         Wheelchair     Assist Will patient use wheelchair at discharge?: (TBD) Type of Wheelchair: Manual    Wheelchair assist level: Supervision/Verbal cueing Max wheelchair distance: 100'    Wheelchair 50 feet with 2 turns activity    Assist        Assist Level: Supervision/Verbal cueing   Wheelchair 150 feet activity     Assist Wheelchair 150 feet activity did not occur: Safety/medical concerns          Medical Problem List and Plan: 1.Decreased  functional mobilitysecondary to traumatic left frontal intraparenchymal hematoma  Continue CIR  Weekend notes reviewed- hospitalist consulted due to hyperglycemia with elevations greater than 600 2. Antithrombotics: -DVT/anticoagulation:SCDs -antiplatelet therapy: None secondary to intraparenchymal hematoma 3. Pain Management:Lidoderm patch 4. Mood:Provide emotional support -antipsychotic agents: none 5. Neuropsych: This patientis not fully capable of making decisions on herown behalf. 6. Skin/Wound Care:Routine skin checks 7. Fluids/Electrolytes/Nutrition:Routine in and out's 8. 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 Follow-up per EP  9. Moderate severe aortic stenosis. Continue Lopressor 25 mg twice a day -follow for tolerance of physical activity with therapies 10. Dysphagia  Advance diet as tolerated  D/c IVF, improving fluid intake 11. Intermittent coffee-ground emesis. 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 Continue PPI.  Follow-up gastroenterology services currently with conservative care.  Hemoglobin 10.8 on 3/30  Continue to monitor 12. Diabetes mellitus  Patient had been on Tresiba 10 units daily prior to admission.  Lantus insulin10 units twice daily, decreased to daily on 3/24, increased to 14 units on 3/29.  Check blood sugars before meals and at bedtime.    CBG (last 3)  Recent Labs    02/23/19 2340 02/24/19 0355 02/24/19 0847  GLUCAP 108* 103* 187*   Labile on 3/30  Continue am dose metformin  13. Hypoalbuminemia  Supplement initiated on 3/23 14.  Right pleural effusion  Continue to monitor 15.  Rib fractures  Right 6-8  Conservative care 16.  Leukocytosis: Resolved  WBCs 7.6 on 3/30  Low-grade fever resolved  Off macrobid 17.  Labile blood pressure   Vitals:   02/23/19 1952 02/24/19 0357  BP: (!) 143/45 (!) 168/65  Pulse: 84 81  Resp: 15 18  Temp: 98  F (36.7 C) 98 F (36.7 C)  SpO2: 99% 97%   Continue metoprolol 25 twice daily  Labile on 3/30 18.  Hx Lethargy-  Multifactorial- TBI, Urine cx neg d/c macrobid   Will consider repeat CT if decline in status,- appears to be improving slowly 19.  Hypokalemia  Potassium 3.3 on 3/28  Likely related to supplemental insulin administration  Labs ordered for tomorrow   LOS: 10 days A FACE TO FACE EVALUATION WAS PERFORMED  Nixxon Faria Karis Juba 02/24/2019, 9:34 AM

## 2019-02-24 NOTE — Progress Notes (Signed)
Speech Language Pathology Daily Session Note  Patient Details  Name: Danielle Clarke MRN: [id number] Date of Birth: [birth year]  Today's Date: 02/24/2019 SLP Individual Time: 1000-1100 SLP Individual Time Calculation (min): 60 min  Short Term Goals: Week 2: SLP Short Term Goal 1 (Week 2): STG=LTG due to remaining length of stay.    Skilled Therapeutic Interventions: Skilled ST services focused on cognitive skills. Pt was seated in dayroom for treatment session. SLP facilitated basic problem solving and sustained attention skills in card sorting tasks, pt required supervision A verbal cues for problem solving while sorting colors with min A verbal cues for sustained attention in 10 minute intervals, however with increase task complexity pt required increase encouragement to continue (2-4 minute intervals), mod A verbal cues for recall/problem solving and mod verbal cues for sustained attention in 15 minute interval. SLP facilitated basic problem solving skills utilizing 3 card sequence tasks, pt required min A verbal cues for problem solving, however required max A verbal cues for verbal explantation of pictures stating "I don't know."  Pt refused PO snack trials upon offering. Pt was left in room with call bell within reach and chair alarm set in room. ST recommends to continue skilled ST services.      Pain Pain Assessment Pain Score: 0-No pain  Therapy/Group: Individual Therapy  Danielle Clarke  [facility] 02/24/2019, 3:45 PM

## 2019-02-24 NOTE — Progress Notes (Signed)
Occupational Therapy Session Note  Patient Details  Name: Danielle Clarke MRN: [id number] Date of Birth: [birth year]  Today's Date: 02/24/2019  Session 1 OT Individual Time: 0100-7121 OT Individual Time Calculation (min): 55 min   Session 2 OT Individual Time: 1230-1300 OT Individual Time Calculation (min): 30 min    Short Term Goals: Week 2:  OT Short Term Goal 1 (Week 2): Pt will be able to self cleanse after toileting with min A. OT Short Term Goal 2 (Week 2): Pt will complete toilet transfer with S recalling safe transfer steps to demonstrate improved cognition. OT Short Term Goal 3 (Week 2): Pt will tolerate standing grooming task for 2 minutes  Skilled Therapeutic Interventions/Progress Updates:  Session 1   Pt greeted semi-reclined in bed with nurse tech about to set pt up for breakfast, OT took over and had pt come to sitting EOB with mod A to elevate trunk. [name] daughter called and OT talked with daughter regarding pt progress, dc plan, home set-up, and DME needs. Per daughter-she si having railings put in and has cleaned up patients home for easier access. Pt noted to be incontinent of bowel and bladder. OT asked pt if she could tell when she needed to go to the bathroom, she stated "yes." Pt unable to state why she was not asking to get to the bathroom or BSC. Discussed possibly putting her on timed toileting schedule. Sit<>stand with min A and had pt complete peri-care with wash cloth and CGA for balance. Worked on LB dressing with donning socks and threading pants in figure 4 position. Pt still needed assistance to reach feet in figure 4 position. Min A to ambulate to recliner HHA. Pt the set-up for breakfast and was able to self-feed with supervision. Pt left seated in recliner with safety belt on and needs met.   Session 2 Pt greeted seated in recliner and agreeable to OT treatment session. OT noted that pt had been incontinent of bladder which had soaked through brief and  pants. Pt unaware. OT discussed timed toileting with nursing staff to implement every 2 hours. Pt ambulated to the bathroom with min A. Pt able to void a little more bladder into commode and had smear of BM. Worked on standing balance and endurance within toileting task. OT set pt up with wash cloths, then she was able to perform peri-care and wash buttocks. Pt ambulated back to recliner and was left seated in recliner with alarm belt on and needs met.   Therapy Documentation Precautions:  Precautions Precautions: Fall, ICD/Pacemaker Restrictions Weight Bearing Restrictions: No LUE Weight Bearing: Weight bearing as tolerated Pain:   denies pain  Therapy/Group: Individual Therapy  Valma Cava 02/24/2019, 1:04 PM

## 2019-02-24 NOTE — Plan of Care (Signed)
  Problem: Consults Goal: RH STROKE PATIENT EDUCATION Description See Patient Education module for education specifics  Outcome: Progressing   Problem: RH BLADDER ELIMINATION Goal: RH STG MANAGE BLADDER WITH ASSISTANCE Description STG Manage Bladder With min Assistance  Outcome: Progressing Flowsheets (Taken 02/24/2019 1418) STG: Pt will manage bladder with assistance: 4-Minimal assistance   Problem: RH SKIN INTEGRITY Goal: RH STG MAINTAIN SKIN INTEGRITY WITH ASSISTANCE Description STG Maintain Skin Integrity With min Assistance.  Outcome: Progressing Flowsheets (Taken 02/24/2019 1418) STG: Maintain skin integrity with assistance: 4-Minimal assistance   Problem: RH SAFETY Goal: RH STG ADHERE TO SAFETY PRECAUTIONS W/ASSISTANCE/DEVICE Description STG Adhere to Safety Precautions With cues/reminders Assistance/Device.  Outcome: Progressing Flowsheets (Taken 02/24/2019 1418) STG:Pt will adhere to safety precautions with assistance/device: 4-Minimal assistance   Problem: RH KNOWLEDGE DEFICIT Goal: RH STG INCREASE KNOWLEDGE OF HYPERTENSION Description Pt will be able to state understanding of management of hypertension using resources/handouts with reminders/cues  Outcome: Progressing Goal: RH STG INCREASE KNOWLEDGE OF STROKE PROPHYLAXIS Description Manage cardiac issues and prevention of secondary ICH using resources with cues/reminders  Outcome: Progressing Goal: RH STG INCREASE KNOWLEDGE OF DIABETES Description Pt will be able to state understanding of management of diabetes using resources/handouts with reminders/cues   Outcome: Progressing Goal: RH STG INCREASE KNOWLEGDE OF HYPERLIPIDEMIA Description Pt will be able to state understanding of management of hyperlipidemia using resources/handouts with reminders/cues   Outcome: Progressing

## 2019-02-24 NOTE — Progress Notes (Signed)
Inpatient Diabetes Program Recommendations  AACE/ADA: New Consensus Statement on Inpatient Glycemic Control   Target Ranges:  Prepandial:   less than 140 mg/dL      Peak postprandial:   less than 180 mg/dL (1-2 hours)      Critically ill patients:  140 - 180 mg/dL    Review of Glycemic Control Diabetes history: DM1 (makes NO insulin; requires basal, correction, and meal coverage insulin) Outpatient Diabetes medications: Tresiba 10 units daily, Humalog 2-13 units QID Current orders for Inpatient glycemic control:Lantus 14 units daily, Novolog 0-15 units Q4H, Metformin 500 mg QAM  Inpatient Diabetes Program Recommendations:   Since patient has Type 1 DM, she can not make any insulin and will require insulin coverage for all carbohydrates consumed (in food and fluids). Glucose increased to over 300 after meals.  Please consider ordering Novolog 4 units TID with meals for meal coverage if patient eats at least 50% of meals.  Thanks, Christena Deem RN, MSN, BC-ADM Inpatient Diabetes Coordinator Team Pager [phone] (8a-5p)

## 2019-02-24 NOTE — Progress Notes (Signed)
Physical Therapy Session Note  Patient Details  Name: Danielle Clarke MRN: [id number] Date of Birth: 01-04-1941  Today's Date: 02/24/2019 PT Individual Time: 0900-0958 PT Individual Time Calculation (min): 58 min   Short Term Goals: Week 2:  PT Short Term Goal 1 (Week 2): =LTGs due to ELOS  Skilled Therapeutic Interventions/Progress Updates:   Pt in recliner and agreeable to therapy, no c/o pain. Pt more pleasant and alert this session. Min assist transfer to w/c, pt self-propelled w/c to/from therapy gym w/ supervision using BUEs, ~125'. Worked on endurance and functional mobility this session. Initiated stair training, negotiated 4 steps w/ B rails, CGA. Per daughter, she is putting in rails at pt's house for the 2 step entry. Ambulated 150' x2 w/ RW, CGA to supervision, much improved since last session with this therapist. Worked on standing endurance/tolerance remainder of session while participating in cognitive tasks. Stood w/ intermittent UE support to perform card matching task and peg board tasks. Stood 4 min and then 8 min w/ supervision. Min tactile and verbal cues for initiation of tasks this session and verbal/visual cues for problem solving w/ getting up from lower chair. Min assist to boost from chair, otherwise all stand pivots w/ CGA this session. Returned to room and ended session in w/c and handoff to ST.  Therapy Documentation Precautions:  Precautions Precautions: Fall, ICD/Pacemaker Restrictions Weight Bearing Restrictions: No LUE Weight Bearing: Weight bearing as tolerated  Therapy/Group: Individual Therapy  Angeleen Horney K Aylanie Cubillos 02/24/2019, 10:00 AM

## 2019-02-25 ENCOUNTER — Inpatient Hospital Stay (HOSPITAL_COMMUNITY): Payer: Medicare Other | Admitting: Physical Therapy

## 2019-02-25 ENCOUNTER — Inpatient Hospital Stay (HOSPITAL_COMMUNITY): Payer: Medicare Other | Admitting: Occupational Therapy

## 2019-02-25 ENCOUNTER — Inpatient Hospital Stay (HOSPITAL_COMMUNITY): Payer: Medicare Other | Admitting: Speech Pathology

## 2019-02-25 DIAGNOSIS — I169 Hypertensive crisis, unspecified: Secondary | ICD-10-CM

## 2019-02-25 LAB — GLUCOSE, CAPILLARY
Glucose-Capillary: 104 mg/dL — ABNORMAL HIGH (ref 70–99)
Glucose-Capillary: 147 mg/dL — ABNORMAL HIGH (ref 70–99)
Glucose-Capillary: 170 mg/dL — ABNORMAL HIGH (ref 70–99)
Glucose-Capillary: 185 mg/dL — ABNORMAL HIGH (ref 70–99)
Glucose-Capillary: 189 mg/dL — ABNORMAL HIGH (ref 70–99)
Glucose-Capillary: 225 mg/dL — ABNORMAL HIGH (ref 70–99)
Glucose-Capillary: 57 mg/dL — ABNORMAL LOW (ref 70–99)

## 2019-02-25 LAB — BASIC METABOLIC PANEL
Anion gap: 12 (ref 5–15)
BUN: 15 mg/dL (ref 8–23)
CO2: 25 mmol/L (ref 22–32)
Calcium: 8.9 mg/dL (ref 8.9–10.3)
Chloride: 99 mmol/L (ref 98–111)
Creatinine, Ser: 0.71 mg/dL (ref 0.44–1.00)
GFR calc Af Amer: >60 mL/min (ref >60)
GFR calc non Af Amer: >60 mL/min (ref >60)
Glucose, Bld: 175 mg/dL — ABNORMAL HIGH (ref 70–99)
Potassium: 4.4 mmol/L (ref 3.5–5.1)
Sodium: 136 mmol/L (ref 135–145)

## 2019-02-25 MED ORDER — DEXTROSE 50 % IV SOLN
INTRAVENOUS | Status: AC
  Administered 2019-02-25: 25 mL
  Filled 2019-02-25: qty 50

## 2019-02-25 MED ORDER — LISINOPRIL 5 MG PO TABS
2.5000 mg | ORAL_TABLET | Freq: Every day | ORAL | Status: DC
  Administered 2019-02-25 – 2019-02-27 (×3): 2.5 mg via ORAL
  Filled 2019-02-25 (×3): qty 1

## 2019-02-25 NOTE — Progress Notes (Signed)
Speech Language Pathology Daily Session Note  Patient Details  Name: Danielle Clarke MRN: [id number] Date of Birth: [birth year]  Today's Date: 02/25/2019 SLP Individual Time: 1035-1130 SLP Individual Time Calculation (min): 55 min  Short Term Goals: Week 2: SLP Short Term Goal 1 (Week 2): STG=LTG due to remaining length of stay.    Skilled Therapeutic Interventions:  Pt was seen for skilled ST targeting dysphagia and cognition goals.  Pt was asleep in recliner upon therapist's arrival but awoke to voice and light touch and was agreeable to participating in therapy with encouragement and extra time for arousal.  SLP facilitated the session with a trial snack of dys 3 textures to continue working towards diet progression.  Pt consumed advanced solids with no overt s/s of aspiration with solids or liquids.  No evidence of globus sensation apparent with trials and pt actually demonstrated increased PO intake with advanced solids.  Recommend diet advancement to dys 3 textures and thin liquids at this time.  SLP also facilitated the session with verbal description tasks to address expressive language goals.  Initially, pt needed mod-max assist for specific language to name objects and describe actions in photographs; however, as session progressed therapist was able to fade cues to min assist. Suspect attention and motivation to participate in task is to an extent impacting specificity of pt's language as pt was noted to have bursts of clearer language immediately after receiving positive feedback following breakthroughs in moments of word finding difficulty.  Overall, pt's mentation was also clearer today in comparison to previous therapy sessions and pt seemed more motivated to work during therapy session.  Pt was returned to room and left in recliner with chair alarm set and call bell within reach.  Continue per current plan of care.    Pain Pain Assessment Pain Scale: 0-10 Pain Score: 0-No  pain  Therapy/Group: Individual Therapy  Staley Budzinski, Melanee Spry 02/25/2019, 3:48 PM

## 2019-02-25 NOTE — Progress Notes (Signed)
Occupational Therapy Session Note  Patient Details  Name: Danielle Clarke MRN: [id number] Date of Birth: 03/12/1941  Today's Date: 02/25/2019 OT Individual Time: 0735-0900 OT Individual Time Calculation (min): 85 min    Short Term Goals: Week 2:  OT Short Term Goal 1 (Week 2): Pt will be able to self cleanse after toileting with min A. OT Short Term Goal 2 (Week 2): Pt will complete toilet transfer with S recalling safe transfer steps to demonstrate improved cognition. OT Short Term Goal 3 (Week 2): Pt will tolerate standing grooming task for 2 minutes  Skilled Therapeutic Interventions/Progress Updates:    Pt greeted asleep in bed, easy to wake, and agreeable to OT treatment session. Pt needed more than reasonable amount of time to come to sitting EOB and mod A to elevate trunk. Pt needed extended rest break seated EOB, then was able to stand w/ RW and min A. Pt ambulated into bathroom w/ RW and min A. Min A to transfer onto raised commode. Pt voided bladder and had small BM. Sit<>stand with min A, then set-up pt with wash cloths and she was then able to complete her own peri-care. Pt ambulated to tub bench in similar fashion. Bathing completed with min A and verbal cues for technique to lean to was buttocks and figure 4 position to wash feet. Pt ambulated out of shower with RW and min A. Dressing completed seated in recliner with more than reasonable amount of time, but pt able to thread B LEs into pants. Min A to stand, then pt able to pull pants up over hips. Pt noted to have tennis shoes in the room with lift for LLE. Worked on donning shoes with pt needing min A. Pt then ate breakfast with set-up/supervision assist and no coughing noted. Pt left seated in recliner with needs met.   Therapy Documentation Precautions:  Precautions Precautions: Fall, ICD/Pacemaker Restrictions Weight Bearing Restrictions: No LUE Weight Bearing: Weight bearing as tolerated  Pain: Denies  pain  Therapy/Group: Individual Therapy  Valma Cava 02/25/2019, 8:05 AM

## 2019-02-25 NOTE — Progress Notes (Signed)
Notified on call Elita Quick Love, PA) current b/p reading 136/36, schedule Lopressor 25mg  po, parameters , order to hold meds, reassess by MD tomorrow

## 2019-02-25 NOTE — Progress Notes (Signed)
Inpatient Diabetes Program Recommendations  AACE/ADA: New Consensus Statement on Inpatient Glycemic Control (2015)  Target Ranges:  Prepandial:   less than 140 mg/dL      Peak postprandial:   less than 180 mg/dL (1-2 hours)      Critically ill patients:  140 - 180 mg/dL   Lab Results  Component Value Date   GLUCAP 185 (H) 02/25/2019   HGBA1C 7.9 (H) 01/04/2017    Review of Glycemic Control Results for NEONA, TALLENT (MRN [id number]) as of 02/25/2019 11:11  Ref. Range 02/25/2019 04:00 02/25/2019 05:00 02/25/2019 08:09  Glucose-Capillary Latest Ref Range: 70 - 99 mg/dL 57 (L) 010 (H) 932 (H)   Diabetes history:DM1 (makes NO insulin; requires basal, correction, and meal coverage insulin) Outpatient Diabetes medications:Tresiba 10 units daily, Humalog 2-13 units QID Current orders for Inpatient glycemic control:Lantus14units daily, Novolog 0-15units Q4H, Metformin 500 mg QAM  Inpatient Diabetes Program Recommendations:  Noted patient experienced hypoglycemic event of 57 mg/dL.   Insulin-Correction: Please consider decreasing Novolog correction to sensitive scale 0-9 units Q4H.  Insulin-Meal Coverage: Since patient has Type 1 DM, she can not make any insulin and will require insulin coverage for all carbohydrates consumed (in food and fluids).   Please consider ordering Novolog 4 units to cover nutritional supplement at 1000 & 1400. This is when glucose trends are most elevated in following consumption of morning and afternoon supplement.   Thanks, Lujean Rave, MSN, RNC-OB Diabetes Coordinator [phone] (8a-5p)

## 2019-02-25 NOTE — Plan of Care (Signed)
  Problem: Consults Goal: RH STROKE PATIENT EDUCATION Description See Patient Education module for education specifics  Outcome: Progressing   Problem: RH BLADDER ELIMINATION Goal: RH STG MANAGE BLADDER WITH ASSISTANCE Description STG Manage Bladder With min Assistance  Outcome: Progressing   Problem: RH SKIN INTEGRITY Goal: RH STG MAINTAIN SKIN INTEGRITY WITH ASSISTANCE Description STG Maintain Skin Integrity With min Assistance.  Outcome: Progressing Goal: RH STG ABLE TO PERFORM INCISION/WOUND CARE W/ASSISTANCE Description STG Able To Perform Incision/Wound Care With min Assistance.  Outcome: Progressing   Problem: RH SAFETY Goal: RH STG ADHERE TO SAFETY PRECAUTIONS W/ASSISTANCE/DEVICE Description STG Adhere to Safety Precautions With cues/reminders Assistance/Device.  Outcome: Progressing   Problem: RH PAIN MANAGEMENT Goal: RH STG PAIN MANAGED AT OR BELOW [name] PAIN GOAL Description At or below level 4  Outcome: Progressing   Problem: RH KNOWLEDGE DEFICIT Goal: RH STG INCREASE KNOWLEDGE OF HYPERTENSION Description Pt will be able to state understanding of management of hypertension using resources/handouts with reminders/cues  Outcome: Progressing Goal: RH STG INCREASE KNOWLEDGE OF DYSPHAGIA/FLUID INTAKE Description Able to manage dysphagia and fluid restrictions with cues/reminders  Outcome: Progressing Goal: RH STG INCREASE KNOWLEDGE OF STROKE PROPHYLAXIS Description Manage cardiac issues and prevention of secondary ICH using resources with cues/reminders  Outcome: Progressing Goal: RH STG INCREASE KNOWLEDGE OF DIABETES Description Pt will be able to state understanding of management of diabetes using resources/handouts with reminders/cues   Outcome: Progressing Goal: RH STG INCREASE KNOWLEGDE OF HYPERLIPIDEMIA Description Pt will be able to state understanding of management of hyperlipidemia using resources/handouts with reminders/cues   Outcome:  Progressing   

## 2019-02-25 NOTE — Progress Notes (Signed)
[facility] PHYSICAL MEDICINE & REHABILITATION PROGRESS NOTE  Subjective/Complaints: Patient seen laying in bed this morning.  She states she slept well overnight.  She denies complaints.  ROS: Denies CP, shortness of breath, nausea, vomiting, diarrhea.  Objective: Vital Signs: Blood pressure (!) 183/69, pulse 79, temperature 98.4 F (36.9 C), temperature source Oral, resp. rate 17, height 5' (1.524 m), weight 50.6 kg, SpO2 100 %. No results found. Recent Labs    02/24/19 0803  WBC 7.6  HGB 10.8*  HCT 32.5*  PLT 405*   Recent Labs    02/22/19 1707 02/25/19 0612  NA 136 136  K 3.3* 4.4  CL 98 99  CO2 26 25  GLUCOSE 195* 175*  BUN 30* 15  CREATININE 0.88 0.71  CALCIUM 9.0 8.9    Physical Exam: BP (!) 183/69 (BP Location: Left Arm)   Pulse 79   Temp 98.4 F (36.9 C) (Oral)   Resp 17   Ht 5' (1.524 m)   Wt 50.6 kg   SpO2 100%   BMI 21.79 kg/m  Constitutional: No distress . Vital signs reviewed. HENT: Normocephalic.  Atraumatic. Eyes: EOMI. No discharge. Cardiovascular: RRR.  No JVD.  + Murmur Respiratory: CTA bilaterally.  Normal effort. GI: BS +. Non-distended. Musc: No edema or tenderness in extremities. Neurological: Alert and oriented x3, except for date of month Slow to process, improving.  Motor: Grossly 4-4+/5 throughout, stable Skin: Skin iswarmand dry.  Psychiatric: Somewhat confused.  Assessment/Plan: 1. Functional deficits secondary to traumatic left frontal intraparenchymal hematoma which require 3+ hours per day of interdisciplinary therapy in a comprehensive inpatient rehab setting.  Physiatrist is providing close team supervision and 24 hour management of active medical problems listed below.  Physiatrist and rehab team continue to assess barriers to discharge/monitor patient progress toward functional and medical goals  Care Tool:  Bathing    Body parts bathed by patient: Right arm, Left arm, Chest, Abdomen, Face, Right upper leg,  Left upper leg   Body parts bathed by helper: Buttocks     Bathing assist Assist Level: Moderate Assistance - Patient 50 - 74%     Upper Body Dressing/Undressing Upper body dressing   What is the patient wearing?: Pull over shirt    Upper body assist Assist Level: Minimal Assistance - Patient > 75%    Lower Body Dressing/Undressing Lower body dressing      What is the patient wearing?: Pants, Incontinence brief     Lower body assist Assist for lower body dressing: Maximal Assistance - Patient 25 - 49%     Toileting Toileting    Toileting assist Assist for toileting: Moderate Assistance - Patient 50 - 74%     Transfers Chair/bed transfer  Transfers assist     Chair/bed transfer assist level: Moderate Assistance - Patient 50 - 74%     Locomotion Ambulation   Ambulation assist      Assist level: Contact Guard/Touching assist Assistive device: Walker-rolling Max distance: 150'   Walk 10 feet activity   Assist     Assist level: Contact Guard/Touching assist Assistive device: Walker-rolling   Walk 50 feet activity   Assist Walk 50 feet with 2 turns activity did not occur: Safety/medical concerns  Assist level: Contact Guard/Touching assist Assistive device: Walker-rolling    Walk 150 feet activity   Assist Walk 150 feet activity did not occur: Safety/medical concerns  Assist level: Contact Guard/Touching assist Assistive device: Walker-rolling    Walk 10 feet on uneven surface  activity  Assist Walk 10 feet on uneven surfaces activity did not occur: Safety/medical concerns         Wheelchair     Assist Will patient use wheelchair at discharge?: (TBD) Type of Wheelchair: Manual    Wheelchair assist level: Supervision/Verbal cueing Max wheelchair distance: 125'    Wheelchair 50 feet with 2 turns activity    Assist        Assist Level: Supervision/Verbal cueing   Wheelchair 150 feet activity     Assist  Wheelchair 150 feet activity did not occur: Safety/medical concerns          Medical Problem List and Plan: 1.Decreased functional mobilitysecondary to traumatic left frontal intraparenchymal hematoma  Continue CIR 2. Antithrombotics: -DVT/anticoagulation:SCDs -antiplatelet therapy: None secondary to intraparenchymal hematoma 3. Pain Management:Lidoderm patch 4. Mood:Provide emotional support -antipsychotic agents: none 5. Neuropsych: This patientis not fully capable of making decisions on herown behalf. 6. Skin/Wound Care:Routine skin checks 7. Fluids/Electrolytes/Nutrition:Routine in and out's 8. 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 Follow-up per EP  9. Moderate severe aortic stenosis. Continue Lopressor 25 mg twice a day -follow for tolerance of physical activity with therapies 10. Dysphagia  Advance diet as tolerated  D/c IVF, improving fluid intake 11. Intermittent coffee-ground emesis. 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 Continue PPI.  Follow-up gastroenterology services currently with conservative care.  Hemoglobin 10.8 on 3/30  Continue to monitor 12. Diabetes mellitus  Patient had been on Tresiba 10 units daily prior to admission.  Lantus insulin10 units twice daily, decreased to daily on 3/24, increased to 14 units on 3/29.  Check blood sugars before meals and at bedtime.    CBG (last 3)  Recent Labs    02/25/19 0018 02/25/19 0400 02/25/19 0500  GLUCAP 104* 57* 147*   Remains extremely labile with hypoglycemia on 3/31  Bedtime snack ordered  Continue am dose metformin  13. Hypoalbuminemia  Supplement initiated on 3/23 14.  Right pleural effusion  Continue to monitor 15.  Rib fractures  Right 6-8  Conservative care 16.  Leukocytosis: Resolved  WBCs 7.6 on 3/30  Low-grade fever resolved  Off macrobid 17.  Labile blood pressure   Vitals:   02/25/19  0404 02/25/19 0405  BP: (!) 183/69 (!) 183/69  Pulse: 78 79  Resp: 17 17  Temp: 98.4 F (36.9 C) 98.4 F (36.9 C)  SpO2: 100% 100%   Continue metoprolol 25 twice daily  Lisinopril 2.5 nightly started on 3/31  Labile with hypertensive crisis this a.m. 18.  Hx Lethargy-improving  Multifactorial- TBI, Urine cx neg d/c macrobid   Will consider repeat CT if decline in status,- appears to be improving slowly 19.  Hypokalemia: Resolved  Potassium 4.4 on 3/31   LOS: 11 days A FACE TO FACE EVALUATION WAS PERFORMED  Ankit Karis Juba 02/25/2019, 8:08 AM

## 2019-02-25 NOTE — Progress Notes (Signed)
Hypoglycemic Event  CBG: 57  Treatment: D50 25 mL (12.5 gm)  Symptoms: None  Follow-up CBG: Time:0500 CBG Result:147  Possible Reasons for Event: Unknown  Comments/MD notified:yes    Terril Amaro  Steffanie Dunn

## 2019-02-25 NOTE — Progress Notes (Signed)
Physical Therapy Session Note  Patient Details  Name: Danielle Clarke MRN: [id number] Date of Birth: [birth year]  Today's Date: 02/25/2019 PT Individual Time: 0930-1011 PT Individual Time Calculation (min): 41 min   Short Term Goals: Week 2:  PT Short Term Goal 1 (Week 2): =LTGs due to ELOS  Skilled Therapeutic Interventions/Progress Updates:    pt rec'd in recliner, agreeable to therapy.  Pt performs sit <> stand with min A and min cuing.  Gait with RW 150' x 2 with CGA.  Standing balance with peg task with pt able to complete with min cues and 100% accuracy.  Pt min A for standing balance due to posterior lean.  Standing balance task with tap ups with min/mod A due to posterior LOB, cues to widen BOS.  Pt then with need to use restroom. Pt performs clothing management and hygiene with supervision, CGA for toilet transfers. Pt incontinent of bowel.  Pt left in recliner with alarm set, needs at hand.  Therapy Documentation Precautions:  Precautions Precautions: Fall, ICD/Pacemaker Restrictions Weight Bearing Restrictions: No LUE Weight Bearing: Non weight bearing Pain: Pain Assessment Pain Scale: 0-10 Pain Score: 0-No pain    Therapy/Group: Individual Therapy  [name] 02/25/2019, 10:13 AM

## 2019-02-26 ENCOUNTER — Inpatient Hospital Stay (HOSPITAL_COMMUNITY): Payer: Medicare Other | Admitting: Speech Pathology

## 2019-02-26 ENCOUNTER — Inpatient Hospital Stay (HOSPITAL_COMMUNITY): Payer: Medicare Other

## 2019-02-26 ENCOUNTER — Inpatient Hospital Stay (HOSPITAL_COMMUNITY): Payer: Medicare Other | Admitting: Physical Therapy

## 2019-02-26 DIAGNOSIS — I10 Essential (primary) hypertension: Secondary | ICD-10-CM

## 2019-02-26 LAB — GLUCOSE, CAPILLARY
Glucose-Capillary: 111 mg/dL — ABNORMAL HIGH (ref 70–99)
Glucose-Capillary: 116 mg/dL — ABNORMAL HIGH (ref 70–99)
Glucose-Capillary: 170 mg/dL — ABNORMAL HIGH (ref 70–99)
Glucose-Capillary: 227 mg/dL — ABNORMAL HIGH (ref 70–99)
Glucose-Capillary: 268 mg/dL — ABNORMAL HIGH (ref 70–99)
Glucose-Capillary: 388 mg/dL — ABNORMAL HIGH (ref 70–99)
Glucose-Capillary: 57 mg/dL — ABNORMAL LOW (ref 70–99)

## 2019-02-26 MED ORDER — INSULIN ASPART 100 UNIT/ML ~~LOC~~ SOLN
0.0000 [IU] | Freq: Three times a day (TID) | SUBCUTANEOUS | Status: DC
  Administered 2019-02-26: 5 [IU] via SUBCUTANEOUS
  Administered 2019-02-27: 09:00:00 8 [IU] via SUBCUTANEOUS
  Administered 2019-02-27: 15 [IU] via SUBCUTANEOUS
  Administered 2019-02-28: 8 [IU] via SUBCUTANEOUS

## 2019-02-26 MED ORDER — INSULIN GLARGINE 100 UNIT/ML ~~LOC~~ SOLN
12.0000 [IU] | Freq: Every day | SUBCUTANEOUS | Status: DC
  Administered 2019-02-26 – 2019-02-27 (×2): 12 [IU] via SUBCUTANEOUS
  Filled 2019-02-26 (×2): qty 0.12

## 2019-02-26 NOTE — Progress Notes (Signed)
Occupational Therapy Session Note  Patient Details  Name: Danielle Clarke MRN: [id number] Date of Birth: July 09, 1941  Today's Date: 02/26/2019 OT Individual Time: 0730-0900 OT Individual Time Calculation (min): 90 min    Short Term Goals: Week 2:  OT Short Term Goal 1 (Week 2): Pt will be able to self cleanse after toileting with min A. OT Short Term Goal 2 (Week 2): Pt will complete toilet transfer with S recalling safe transfer steps to demonstrate improved cognition. OT Short Term Goal 3 (Week 2): Pt will tolerate standing grooming task for 2 minutes  Skilled Therapeutic Interventions/Progress Updates:    Pt received supine sleeping, requiring increased time and verbal cues/environmental cues (light) to wake up. Pt slow to start this morning with increased time and effort in transfers/ADLs. Pt required min A to transition sidelying to EOB with cueing for technique/UE placement. Pt declined Ted hose while supine but they were donned while pt was sitting EOB d/t necessity. Pt completed SPT to recliner with RW with min A to power up from EOB and CGA for transfer. Pt completed UB bathing sitting in recliner with set up assist, CGA to don shirt. Pt transitioned into standing with mod A and initiated peri hygiene. Pt abruptly began having urine incontinence. Pt returned to sitting on recliner and was quickly squat pivoted to [facility]. Pt had no further void in toilet. Pt required min A to stand from [facility] and completed peri hygiene in standing with CGA. Pt required assistance to thread BLE into pull up and pants. Pt able to pull up in standing with CGA for balance support. NT Zach entered room and checked pt's blood sugar- pt very hyperglycemic. Despite this and relative hypotension pt stating she "doesn't feel bad at all". Pt was set up to eat breakfast in recliner- feet remained elevated d/t low blood pressure. Intermittent coughing during eating- pt further elevated in sitting. Pt left sitting up- passed  off to PT.   BP as follows: Supine: 132/42 EOB: 113/52 Following SPT: 119/42 Following 20 min OOB sitting: 127/40 Feet elevated: 122/42   Therapy Documentation Precautions:  Precautions Precautions: Fall, ICD/Pacemaker Restrictions Weight Bearing Restrictions: No LUE Weight Bearing: Non weight bearing Pain:  6/10 pain in back reported during mobility, alleviated with rest.    Therapy/Group: Individual Therapy  Crissie Reese 02/26/2019, 7:29 AM

## 2019-02-26 NOTE — Patient Care Conference (Signed)
Inpatient RehabilitationTeam Conference and Plan of Care Update Date: 02/26/2019   Time: 11:30 AM    Patient Name: Danielle Clarke      Medical Record Number: [id number]  Date of Birth: [birth year] Sex: Female         Room/Bed: 4M02C/4M02C-01 Payor Info: Payor: [employer] MEDICARE / Plan: [employer] MEDICARE / Product Type: *No Product type* /    Admitting Diagnosis: hemorrhage and hematoma  Admit Date/Time:  02/14/2019  2:40 PM Admission Comments: No comment available   Primary Diagnosis:  <principal problem not specified> Principal Problem: <principal problem not specified>  Patient Active Problem List   Diagnosis Date Noted  . Hypertensive crisis   . Hypokalemia   . Leukocytosis   . Lethargy   . Labile blood pressure   . 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 Acute blood loss anemia   . Low grade fever   . Hypoglycemia   . Multiple closed fractures of ribs of right side   . Pleural effusion on right   . Hypoalbuminemia due to protein-calorie malnutrition (HCC)   . Dysphagia   . Traumatic cerebral intraparenchymal hematoma (HCC) 02/14/2019  . Pacemaker   . Labile blood glucose   . Diabetes mellitus type 2 in nonobese (HCC)   . Dyslipidemia   . Essential hypertension   . Hypernatremia   . Anemia of chronic disease   . Hypertensive urgency 02/07/2019  . Intracerebral hemorrhage (HCC) 02/07/2019  . Intracranial bleeding (HCC) 02/06/2019  . Elevated troponin   . Abnormal EKG   . Bronchitis 08/05/2017  . Bilateral carotid artery stenosis   . Aortic stenosis 08/25/2015  . Cervical stenosis of spinal canal 07/31/2014  . CKD (chronic kidney disease) stage 2, GFR 60-89 ml/min 12/30/2013  . Essential hypertension, benign 12/30/2013  . Hyperlipidemia 12/30/2013  . Toe infection 12/30/2013  . Anemia 12/30/2013  . Acute renal failure (HCC) 12/29/2013  . Syncope 01/02/2013  . Diabetes mellitus type 1 (HCC) 01/02/2013  . Bilateral carotid artery  disease (HCC) 01/02/2013  . GOITER, MULTINODULAR 02/11/2008    Expected Discharge Date: Expected Discharge Date: 02/28/19  Team Members Present: Physician leading conference: Dr. Maryla Morrow Social Worker Present: Amada Jupiter, LCSW Nurse Present: Willey Blade, RN PT Present: Carlynn Purl, PT OT Present: Blanch Media, OT SLP Present: Feliberto Gottron, SLP PPS Coordinator present : Edson Snowball, PT     Current Status/Progress Goal Weekly Team Focus  Medical   Decreased functional mobility secondary to traumatic left frontal intraparenchymal hematoma  Improve mobility, ABLA, DM, HTN  See above   Bowel/Bladder   Continent and Incontinent episodes, continue timed toileting Q2hrs and prn, LBM 3/30  Maintain regular bowel pattern. Encourage timed toileting.  Assess toileting needs and address concerns, with f/u   Swallow/Nutrition/ Hydration   dys 3, thin liquids, advanced at bedside 3/31 as pt's mentation appears clearer, has no evidence of globus sensation, shows improved PO intake as well as endurance  supervision  toleration of advanced solids    ADL's   Supervision for UB bathing/dressnig, Min A LB bathing/dressing, CGA for balance when toileting. Continues to be incontinent of bowel and bladder  Supervision  Self-care retraining, pt/family education, balance, initiation, dc planning    Mobility   supervision-min assist overall, gait w/ RW 150', 4 steps B rails w/ CGA, still needs cues for initiation but much improved  supervision household gait   endurance, global strengthening, cognitive remediation   Communication   Mod A  Min  A  specificity of language, word finding, conveying needs/wants to caregivers more efficiently    Safety/Cognition/ Behavioral Observations  Mod A  Min assist, downgraded last week due to slow progress   continue to address attention, problem solving, awareness, and recall    Pain   Rate pain 5/10 on pain scale continue prn and repostionig , Remain on  Lidoderm patch as ordered and prn med  Pain < 2  Continue to assess QS and prn with reassessment noted, address concerns with medical team   Skin   MASD improving to groin and peri area,, address incontinence asap, Left uper chest pacer site w/o drainage, readness oe irritation, 2X2 dressing   Treat skin issues per orders.  Assess QS and PRN address clinical issues    Rehab Goals Patient on target to meet rehab goals: Yes *See Care Plan and progress notes for long and short-term goals.     Barriers to Discharge  Current Status/Progress Possible Resolutions Date Resolved   Physician    Medical stability;Incontinence     See above  Therapies, follow labs, optimize HTN/DM meds      Nursing                  PT                    OT                  SLP                SW                Discharge Planning/Teaching Needs:  plan to d/c home with daughter/ family to provide 24/7 supervision or assist  Teaching needs TBD   Team Discussion:  Adjusting meds;  CBGs variable.  incont b/b but better with timed toileting.  Did well the past couple of days but not as good today.  Currently supervision/ min assist with supervision goals.  Diet upgraded to D3 yesterday and tolerating.  Continues with cognitive impairments and may benefit from [employer]  Need to schedule family ed onsite with daughter - SW to follow up.  Revisions to Treatment Plan:  NA    Continued Need for Acute Rehabilitation Level of Care: The patient requires daily medical management by a physician with specialized training in physical medicine and rehabilitation for the following conditions: Daily direction of a multidisciplinary physical rehabilitation program to ensure safe treatment while eliciting the highest outcome that is of practical value to the patient.: Yes Daily medical management of patient stability for increased activity during participation in an intensive rehabilitation regime.: Yes Daily analysis of  laboratory values and/or radiology reports with any subsequent need for medication adjustment of medical intervention for : Neurological problems;Diabetes problems;Other;Blood pressure problems   I attest that I was present, lead the team conference, and concur with the assessment and plan of the team.   Amada Jupiter 02/26/2019, 3:49 PM   Team conference was held via web/ teleconference due to COVID - 19

## 2019-02-26 NOTE — Progress Notes (Signed)
Inpatient Diabetes Program Recommendations  AACE/ADA: New Consensus Statement on Inpatient Glycemic Control   Target Ranges:  Prepandial:   less than 140 mg/dL      Peak postprandial:   less than 180 mg/dL (1-2 hours)      Critically ill patients:  140 - 180 mg/dL   Results for Danielle Clarke, Danielle Clarke (MRN [id number]) as of 02/26/2019 08:48  Ref. Range 02/25/2019 08:09 02/25/2019 11:35 02/25/2019 16:15 02/25/2019 19:42 02/26/2019 00:12 02/26/2019 04:18 02/26/2019 04:54 02/26/2019 08:24  Glucose-Capillary Latest Ref Range: 70 - 99 mg/dL 920 (H) 100 (H) 712 (H) 189 (H) 170 (H) 57 (L) 111 (H) 388 (H)   Review of Glycemic Control  Diabetes history:DM1 (makes NO insulin; requires basal, correction, and meal coverage insulin) Outpatient Diabetes medications:Tresiba 10 units daily, Humalog 2-13 units QID Current orders for Inpatient glycemic control:Lantus12units daily, Novolog 0-15units TID with meals, Metformin 500 mg QAM  Inpatient Diabetes Program Recommendations:  Insulin-Basal: Noted Lantus was decreased from 14 to 12 units daily.  Insulin-Correction: Please consider decreasing Novolog correction to sensitive scale 0-9 units TID with meals and add Novolog 0-5 units QHS for bedtime.  Insulin-Meal Coverage: Since patient has Type 1 DM, she can not make any insulin and will require insulin coverage for all carbohydrates consumed (in food and fluids).  Please consider ordering Novolog 4 units TID with meals for meal coverage if patient eats at least 50% of meals or with supplements if patient is only drinking supplements.   Thanks, Orlando Penner, RN, MSN, CDE Diabetes Coordinator Inpatient Diabetes Program [phone] (Team Pager from 8am to 5pm)

## 2019-02-26 NOTE — Plan of Care (Signed)
Due to the current state of emergency, patients may not be receiving their 3-hours of Medicare-mandated therapy.   

## 2019-02-26 NOTE — Progress Notes (Signed)
Nutrition Follow-up  RD working remotely.  INTERVENTION:   - Continue Glucerna Shake po TID, each supplement provides 220 kcal and 10 grams of protein  - Continue Pro-stat 30 ml BID, each supplement provides 100 kcal and 15 grams of protein  NUTRITION DIAGNOSIS:   Inadequate oral intake related to dysphagia, other (full liquid diet) as evidenced by estimated needs, energy intake < 75% for > 7 days.  Progressing, pt now on Dysphagia 3 diet  GOAL:   Patient will meet greater than or equal to 90% of their needs  Progressing  MONITOR:   PO intake, Diet advancement, Supplement acceptance, Weight trends, Labs  REASON FOR ASSESSMENT:   Other (full liquid diet >7 days)    ASSESSMENT:   78 yo female, admitted with loss of consciousness. PMH significant for DM type 1, HLD, HTN, osteoporosis.  Diet advanced to Dysphagia 3 on 3/31.  Diabetes coordinator following.  Diabetes Coordinator rcommendations per note 4/1: - decrease Novolog correction to sensitive scale 0-9 units TID with meals and add Novolog 0-5 units QHS for bedtime - Novolog 4 units TID with meals for meal coverage if pt eats at least 50% of meals or with supplements if pt is only drinking supplements  Weight up 5 lbs since first measured CIR weight on 02/15/19.  Spoke with pt via phone call. Pt reports that her appetite is "not too bad" and that she is eating well at meals. Pt states that she is doing "alright" with solid foods. Pt confirms that she is drinking Glucerna shakes. Pt states she has no nutrition-related issues at this time. Will plan to continue with current plan of care.  Meal Completion: 40-100% x last 8 meals (average 70%)  Medications reviewed and include: Glucerna Shake TID between meals (pt accepting >90%), Pro-stat 30 ml BID (pt accepting 100%), SSI 0-15 units TID with meals, Lantus 12 units daily, Metformin 500 mg daily, Protonix  Labs reviewed. CBG's: 388, 111, 57, 170, 189, 170 x 24  hours  Diet Order:   Diet Order            DIET DYS 3 Room service appropriate? Yes; Fluid consistency: Thin  Diet effective now              EDUCATION NEEDS:   No education needs have been identified at this time  Skin:  Skin Assessment: Skin Integrity Issues: Other: sound - posterior head; petechiae - medial abdomen; MASD - perineum, buttocks; ecchymosis - BUE, abdomen  Last BM:  02/25/19 medium type 5  Height:   Ht Readings from Last 1 Encounters:  02/14/19 5' (1.524 m)    Weight:   Wt Readings from Last 1 Encounters:  02/26/19 50.9 kg    Ideal Body Weight:  45.5 kg  BMI:  Body mass index is 21.92 kg/m.  Estimated Nutritional Needs:   Kcal:  1300-1560 (25-30 kcal/kg)  Protein:  52-62 g (1-1.2 g/kg)  Fluid:  >/= 1.5 L daily or per MD    Earma Reading, MS, RD, LDN Inpatient Clinical Dietitian Pager: [phone] Weekend/After Hours: [phone]

## 2019-02-26 NOTE — Progress Notes (Signed)
Speech Language Pathology Daily Session Note  Patient Details  Name: Danielle Clarke MRN: [id number] Date of Birth: 1940/12/22  Today's Date: 02/26/2019 SLP Individual Time: 1100-1115; 9379-0240 SLP Individual Time Calculation (min): 15 min, 30 min  Short Term Goals: Week 2: SLP Short Term Goal 1 (Week 2): STG=LTG due to remaining length of stay.    Skilled Therapeutic Interventions:  Session 1:  Pt was received in bed upon therapist's arrival.  Pt initially refusing to participate in therapy.  She stated that she didn't feel well but could not specify whether she was experiencing pain, nausea, or fatigue.  Therapist attempted to encourage pt to participate in therapy; however, pt continued to decline.  Therapist left the room for 15 min and upon return pt continued to attempt to decline.  Therapist insisted that pt at least get up to the recliner to maximize alertness throughout the day.  Pt took significant encouragement and more than a reasonable amount of time to transfer from bed to recliner.  Pt was left in recliner with call bell within reach, lights on, and TV on, session ended early due to refusal.   Session 2:  Pt was received in bed, sleeping but awakened to voice stating that she needed to use the bathroom.  Pt needed mod cues and increased time for initiation and sequencing of transfers from bed to wheelchair, wheelchair <> commode, wheelchair to recliner.  Pt also needed mod cues for safety awareness due to attempting to stand from wheelchair without locking brakes.  Pt was left in recliner with chair alarm set and call bell within reach.  Per nursing report, pt was found transferring herself from recliner to bed earlier this afternoon.  Continue to recommend 24/7 supervision at discharge and will plan to reinforce this with pt's family either in person, over the phone, or with written education given current hospital visitor restrictions.  Continue per current plan of care.      Pain Pain Assessment Pain Scale: Faces Faces Pain Scale: Hurts little more Pain Type: Acute pain Pain Location: Rib cage Pain Descriptors / Indicators: Aching Pain Intervention(s): Repositioned  Therapy/Group: Individual Therapy  Adalynn Corne, Melanee Spry 02/26/2019, 12:31 PM

## 2019-02-26 NOTE — Progress Notes (Signed)
Physical Therapy Session Note  Patient Details  Name: Danielle Clarke MRN: [id number] Date of Birth: Jul 16, 1941  Today's Date: 02/26/2019 PT Individual Time: 0900-0940 PT Individual Time Calculation (min): 40 min   Short Term Goals: Week 2:  PT Short Term Goal 1 (Week 2): =LTGs due to ELOS  Skilled Therapeutic Interventions/Progress Updates:   Pt in recliner, finishing up breakfast and agreeable to therapy, no c/o pain. Encouraged pt to finish eating, pt w/ poor PO intake and educated on importance of nutrition for energy and participation in therapies. Sit>stand from recliner w/ min assist w/ max cues for initiation. Ambulated to/from sink w/ supervision using RW to stand at sink and brush teeth. Pt adamantly refusing to ambulate out of room and participate in PT session. Pt stated "I just can't stand, I need to sit down". Unable to verbalize why she needed to sit down. Returned to [employer] and educated on importance of participation to go home on Friday, pt eventually agreeable to go practice car transfer. However upon attempting to stand, pt repeatedly stated, "I can't". Pt then requesting to return to bed, also unwilling to attempt standing. Ended session in recliner, all needs in reach.  Therapy Documentation Precautions:  Precautions Precautions: Fall, ICD/Pacemaker Restrictions Weight Bearing Restrictions: No LUE Weight Bearing: Non weight bearing  Therapy/Group: Individual Therapy  Astin Sayre Melton Krebs 02/26/2019, 9:41 AM

## 2019-02-26 NOTE — Progress Notes (Signed)
Notified by NT of CBG results 57, patient symptomatic diaphoretic, drowsy, arouse easily, po"s provided  and responsive. CBG repeated results 111. Closely monitored

## 2019-02-26 NOTE — Progress Notes (Signed)
Social Work Patient ID: Danielle Clarke, female   DOB: [birth year], 78 y.o.   MRN: [id number]  Have reviewed team conference with pt and daughter who are aware that team continues to plan for d/c on Friday.  Have scheduled for daughter to be here for family at 7:30 am tomorrow.  I will follow up with them both again tomorrow to make plans for DME and HH f/u.  Brindy Higginbotham, LCSW

## 2019-02-26 NOTE — Progress Notes (Signed)
[facility] PHYSICAL MEDICINE & REHABILITATION PROGRESS NOTE  Subjective/Complaints: Patient seen laying in bed this morning, about to work with therapies.  She states she slept well overnight.  She offers little winded engagement.  She is not aware of her discharge date later this week.  ROS: Denies CP, shortness of breath, nausea, vomiting, diarrhea.  Objective: Vital Signs: Blood pressure (!) 160/46, pulse 85, temperature 98 F (36.7 C), temperature source Oral, resp. rate 18, height 5' (1.524 m), weight 50.9 kg, SpO2 99 %. No results found. Recent Labs    02/24/19 0803  WBC 7.6  HGB 10.8*  HCT 32.5*  PLT 405*   Recent Labs    02/25/19 0612  NA 136  K 4.4  CL 99  CO2 25  GLUCOSE 175*  BUN 15  CREATININE 0.71  CALCIUM 8.9    Physical Exam: BP (!) 160/46 (BP Location: Left Arm)   Pulse 85   Temp 98 F (36.7 C) (Oral)   Resp 18   Ht 5' (1.524 m)   Wt 50.9 kg   SpO2 99%   BMI 21.92 kg/m  Constitutional: No distress . Vital signs reviewed. HENT: Normocephalic.  Atraumatic. Eyes: EOMI. No discharge. Cardiovascular: RRR.  No JVD.  + Murmur Respiratory: CTA bilaterally.  Normal effort. GI: BS +. Non-distended. Musc: No edema or tenderness in extremities. Neurological: Alert and oriented x3, except for date of month, unchanged  Slow to process, improving.  Motor: Grossly 4-4+/5 throughout, left stronger than right Skin: Skin iswarmand dry.  Psychiatric: Limited engagement.  Assessment/Plan: 1. Functional deficits secondary to traumatic left frontal intraparenchymal hematoma which require 3+ hours per day of interdisciplinary therapy in a comprehensive inpatient rehab setting.  Physiatrist is providing close team supervision and 24 hour management of active medical problems listed below.  Physiatrist and rehab team continue to assess barriers to discharge/monitor patient progress toward functional and medical goals  Care Tool:  Bathing    Body parts  bathed by patient: Right arm, Left arm, Chest, Abdomen, Front perineal area, Buttocks, Right upper leg, Left upper leg, Right lower leg, Left lower leg, Face   Body parts bathed by helper: Buttocks     Bathing assist Assist Level: Contact Guard/Touching assist     Upper Body Dressing/Undressing Upper body dressing   What is the patient wearing?: Pull over shirt    Upper body assist Assist Level: Supervision/Verbal cueing    Lower Body Dressing/Undressing Lower body dressing      What is the patient wearing?: Pants     Lower body assist Assist for lower body dressing: Minimal Assistance - Patient > 75%     Toileting Toileting    Toileting assist Assist for toileting: Minimal Assistance - Patient > 75%     Transfers Chair/bed transfer  Transfers assist     Chair/bed transfer assist level: Minimal Assistance - Patient > 75%     Locomotion Ambulation   Ambulation assist      Assist level: Contact Guard/Touching assist Assistive device: Walker-rolling Max distance: 150'   Walk 10 feet activity   Assist     Assist level: Contact Guard/Touching assist Assistive device: Walker-rolling   Walk 50 feet activity   Assist Walk 50 feet with 2 turns activity did not occur: Safety/medical concerns  Assist level: Contact Guard/Touching assist Assistive device: Walker-rolling    Walk 150 feet activity   Assist Walk 150 feet activity did not occur: Safety/medical concerns  Assist level: Contact Guard/Touching assist Assistive device: Walker-rolling  Walk 10 feet on uneven surface  activity   Assist Walk 10 feet on uneven surfaces activity did not occur: Safety/medical concerns         Wheelchair     Assist Will patient use wheelchair at discharge?: (TBD) Type of Wheelchair: Manual    Wheelchair assist level: Supervision/Verbal cueing Max wheelchair distance: 125'    Wheelchair 50 feet with 2 turns activity    Assist         Assist Level: Supervision/Verbal cueing   Wheelchair 150 feet activity     Assist Wheelchair 150 feet activity did not occur: Safety/medical concerns          Medical Problem List and Plan: 1.Decreased functional mobilitysecondary to traumatic left frontal intraparenchymal hematoma  Continue CIR  Team conference today to discuss current and goals and coordination of care, home and environmental barriers, and discharge planning with nursing, case manager, and therapies.  2. Antithrombotics: -DVT/anticoagulation:SCDs -antiplatelet therapy: None secondary to intraparenchymal hematoma 3. Pain Management:Lidoderm patch 4. Mood:Provide emotional support -antipsychotic agents: none 5. Neuropsych: This patientis not fully capable of making decisions on herown behalf. 6. Skin/Wound Care:Routine skin checks 7. Fluids/Electrolytes/Nutrition:Routine in and out's 8. 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 Follow-up per EP  9. Moderate severe aortic stenosis. Continue Lopressor 25 mg twice a day -follow for tolerance of physical activity with therapies 10. Dysphagia:   On D3 thins  Advance diet as tolerated  D/c IVF, improving fluid intake 11. Intermittent coffee-ground emesis. 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 Continue PPI.  Follow-up gastroenterology services currently with conservative care.  Hemoglobin 10.8 on 3/30  Continue to monitor 12. Diabetes mellitus  Patient had been on Tresiba 10 units daily prior to admission.  Lantus insulin10 units twice daily, decreased to daily on 3/24, increased to 14 units on 3/29, decreased to 12 units on 4/1  Novolog changed to SSI with no bedtime coverage on 4/1  Check blood sugars before meals and at bedtime.    CBG (last 3)  Recent Labs    02/26/19 0418 02/26/19 0454 02/26/19 0824  GLUCAP 57* 111* 388*   Remains extremely labile  with symptomatic hypoglycemia on 4/1  Bedtime snack ordered, discussed with nursing  Continue am dose metformin  13. Hypoalbuminemia  Supplement initiated on 3/23 14.  Right pleural effusion  Continue to monitor 15.  Rib fractures  Right 6-8  Conservative care 16.  Leukocytosis: Resolved  WBCs 7.6 on 3/30  Low-grade fever resolved  Off macrobid 17.  Essential hypertension   Vitals:   02/25/19 2340 02/26/19 0447  BP: (!) 146/48 (!) 160/46  Pulse: 83 85  Resp:  18  Temp:  98 F (36.7 C)  SpO2: 99% 99%   Continue metoprolol 25 twice daily  Lisinopril 2.5 nightly started on 3/31  Labile, but?  Improving on 4/1 18.  Hx Lethargy-improving  Multifactorial- TBI, Urine cx neg d/c macrobid   Will consider repeat CT if decline in status,- appears to be improving slowly 19.  Hypokalemia: Resolved  Potassium 4.4 on 3/31   LOS: 12 days A FACE TO FACE EVALUATION WAS PERFORMED  Ankit Karis Juba 02/26/2019, 8:27 AM

## 2019-02-27 ENCOUNTER — Inpatient Hospital Stay (HOSPITAL_COMMUNITY): Payer: Medicare Other | Admitting: Speech Pathology

## 2019-02-27 ENCOUNTER — Inpatient Hospital Stay (HOSPITAL_COMMUNITY): Payer: Medicare Other | Admitting: Physical Therapy

## 2019-02-27 ENCOUNTER — Inpatient Hospital Stay (HOSPITAL_COMMUNITY): Payer: Medicare Other | Admitting: Occupational Therapy

## 2019-02-27 LAB — GLUCOSE, CAPILLARY
Glucose-Capillary: 299 mg/dL — ABNORMAL HIGH (ref 70–99)
Glucose-Capillary: 366 mg/dL — ABNORMAL HIGH (ref 70–99)
Glucose-Capillary: 444 mg/dL — ABNORMAL HIGH (ref 70–99)
Glucose-Capillary: 41 mg/dL — CL (ref 70–99)
Glucose-Capillary: 58 mg/dL — ABNORMAL LOW (ref 70–99)
Glucose-Capillary: 89 mg/dL (ref 70–99)
Glucose-Capillary: 354 mg/dL — ABNORMAL HIGH (ref 70–99)

## 2019-02-27 MED ORDER — INSULIN GLARGINE 100 UNIT/ML ~~LOC~~ SOLN
13.0000 [IU] | Freq: Every day | SUBCUTANEOUS | Status: DC
  Administered 2019-02-28: 09:00:00 13 [IU] via SUBCUTANEOUS
  Filled 2019-02-27 (×2): qty 0.13

## 2019-02-27 NOTE — Progress Notes (Addendum)
Occupational Therapy Discharge Summary  Patient Details  Name: Danielle Clarke MRN: [id number] Date of Birth: 04-14-1941   Pt greeted sitting on commode with nurse tech present. Daughter present throughout session. Pt more fatigued today and required increased time for all BADL tasks. Pt with successful small BM and voided bladdert needed increased time to stand from commode with education provided to daughter on how to provide assistance when she is fatigued. Daughter provided CGA to stand from commode. . Pt initially refused to complete her own peri-care, with encouragement, pt agreeable to try and could complete with supervision. Pt then ambulated to tub bench seat and bathing completed with encouragement and supervision. Pts daughter ambulated with her w/ RW out of bathroom to recliner. Pt able to dress herself with increased time, verbal cues, and multiple rest breaks. Reviewed home set-up with daughter and discussed modifications for safe BADL participation. OT provided pt with home exercise program for UEs using level 2 theraband and reviewed them with pt and daughter. Pt left seated in recliner with daughter present and needs met.   Today's Date: 02/27/2019 OT Individual Time: 0730-0900 OT Individual Time Calculation (min): 90 min    Patient has met 7 of 11 long term goals due to improved activity tolerance, improved balance, postural control, ability to compensate for deficits, improved attention and improved awareness.  Patient to discharge at overall Supervision/CGA level.  Patient's care partner is independent to provide the necessary physical and cognitive assistance at discharge.    Reasons goals not met: Pt still requires CGA at times for sit<>stand, standing balance, and functional transfers.  Recommendation:  Patient will benefit from ongoing skilled OT services in home health setting to continue to advance functional skills in the area of BADL.  Equipment: 3-in-1 BSC, tub  transfer bench, RW  Reasons for discharge: treatment goals met and discharge from hospital  Patient/family agrees with progress made and goals achieved: Yes  OT Discharge Precautions/Restrictions  Precautions Precautions: Fall;ICD/Pacemaker Restrictions Weight Bearing Restrictions: No Pain Pain Assessment Pain Scale: 0-10 Pain Score: 0-No pain ADL ADL Eating: Supervision/safety Grooming: Supervision/safety Upper Body Bathing: Supervision/safety Lower Body Bathing: Supervision/safety Upper Body Dressing: Supervision/safety Lower Body Dressing: Supervision/safety Toileting: Supervision/safety Toilet Transfer: Therapist, music Method: Counselling psychologist: Raised toilet seat Tub/Shower Transfer: Metallurgist Method: Optometrist: Sports coach: Within Functional Limits Ocular Range of Motion: Within Functional Limits Perception  Perception: Within Functional Limits Praxis Praxis: Impaired Praxis Impairment Details: Initiation;Perseveration Cognition Overall Cognitive Status: Impaired/Different from baseline Arousal/Alertness: Awake/alert Orientation Level: Oriented to person;Oriented to place Attention: Sustained Sustained Attention: Impaired Sustained Attention Impairment: Functional basic;Verbal basic Memory: Impaired Memory Impairment: Retrieval deficit;Storage deficit Awareness: Impaired Awareness Impairment: Intellectual impairment Problem Solving: Impaired Problem Solving Impairment: Functional basic;Verbal basic Behaviors: Restless;Impulsive Safety/Judgment: Impaired Sensation Sensation Light Touch: Appears Intact(B UEs) Coordination Gross Motor Movements are Fluid and Coordinated: No Fine Motor Movements are Fluid and Coordinated: Yes Coordination and Movement Description: decreased smoothness and accuracy-improved since eval Motor  Motor Motor: Within  Functional Limits Motor - Discharge Observations: Generalized weakness Mobility  Bed Mobility Bed Mobility: Rolling Right;Rolling Left;Sit to Supine;Supine to Sit Rolling Right: Supervision/verbal cueing Rolling Left: Supervision/Verbal cueing Supine to Sit: Minimal Assistance - Patient > 75% Sit to Supine: Contact Guard/Touching assist Transfers Sit to Stand: Contact Guard/Touching assist Stand to Sit: Contact Guard/Touching assist  Trunk/Postural Assessment  Cervical Assessment Cervical Assessment: Exceptions to WFL(forward head) Thoracic Assessment Thoracic Assessment: Exceptions to WFL(rounded  shoulders) Lumbar Assessment Lumbar Assessment: Exceptions to WFL(posterior pelvic tilt) Postural Control Postural Control: Deficits on evaluation(posterior lean bias, although improved since eval)  Balance Balance Balance Assessed: Yes Static Sitting Balance Static Sitting - Balance Support: Feet supported;No upper extremity supported Static Sitting - Level of Assistance: 5: Stand by assistance Dynamic Sitting Balance Dynamic Sitting - Balance Support: No upper extremity supported;Feet supported Dynamic Sitting - Level of Assistance: 5: Stand by assistance Static Standing Balance Static Standing - Balance Support: Bilateral upper extremity supported;During functional activity Static Standing - Level of Assistance: 5: Stand by assistance Dynamic Standing Balance Dynamic Standing - Balance Support: During functional activity;No upper extremity supported Dynamic Standing - Level of Assistance: 4: Min assist(CGA) Extremity/Trunk Assessment RUE Assessment Active Range of Motion (AROM) Comments: limited shoulder AROM due to Pain in R ribs when lifting arm  General Strength Comments: elbow and grasp 4/5 LUE Assessment Active Range of Motion (AROM) Comments: limited AROM in shoulder due to new pacemaker and AROM precautions General Strength Comments: elbow and grasp 4/5   [name]  [name] 02/27/2019, 1:35 PM

## 2019-02-27 NOTE — Progress Notes (Signed)
Discussed insulin admin with daughter. Daughter demonstrated admin insulin to pt as well as drawing up insulin in syringe.  Pt also has pens to admin and daughter has admin in past. Pt and daughter verbalized an understanding and are comfortable with admin insulin via syringe or pen.

## 2019-02-27 NOTE — Progress Notes (Signed)
Physical Therapy Discharge Summary  Patient Details  Name: Danielle Clarke MRN: [id number] Date of Birth: May 04, 1941  Today's Date: 02/27/2019 PT Individual Time: 0900-1000 PT Individual Time Calculation (min): 60 min   Daughter present for family education this session, pt agreeable to therapy and no c/o pain. Demonstrated appropriate guarding and assist techniques including gait w/ RW, stairs w/ L rail, car transfer, furniture transfers, and bed mobility. Assisted levels for functional mobility as detailed below, therapist provided assist x1 rep and daughter providing hands-on assist for 2nd rep of all activity. Demonstrated use of gait belt and educated on fall risk and provided demonstration of floor transfer w/ therapist as the pt. Educated on energy conservation strategies and continuing to build up endurance w/ functional mobility. Educated daughter on pt's deficits w/ initiation, motor planning, and multimodal cues needed to compensate for safe mobility. Daughter returned demonstration of tactile, verbal, and visual cues appropriately during functional mobility. Returned to room and ended session in recliner, in care of daughter and all needs met.   Patient has met 9 of 12 long term goals due to improved activity tolerance, improved balance, improved postural control, improved attention, improved awareness and improved coordination.  Patient to discharge at an ambulatory level Supervision.   Patient's care partner is independent to provide the necessary physical and cognitive assistance at discharge.  Reasons goals not met: Pt still requires occasional min assist w/ bed mobility 2/2 R rib pain w/ mobility. Pt also continues to require CGA for gait 2/2 safety and cues needed for safety.   Recommendation:  Patient will benefit from ongoing skilled PT services in home health setting to continue to advance safe functional mobility, address ongoing impairments in initiation and attention with  functional tasks, global endurance and strengthening, and balance, and minimize fall risk.  Equipment: transport chair  Reasons for discharge: treatment goals met and discharge from hospital  Patient/family agrees with progress made and goals achieved: Yes  PT Discharge Precautions/Restrictions Precautions Precautions: Fall;ICD/Pacemaker Restrictions Weight Bearing Restrictions: No Vision/Perception  Perception Perception: Within Functional Limits Praxis Praxis: Impaired Praxis Impairment Details: Initiation;Perseveration  Cognition   Sensation Sensation Light Touch: Appears Intact(BLEs) Coordination Gross Motor Movements are Fluid and Coordinated: No Fine Motor Movements are Fluid and Coordinated: Yes Coordination and Movement Description: Gross motor movements impaired 2/2 slow processing speed and global weakness preventing smooth and coordinated movements Motor  Motor Motor: Within Functional Limits Motor - Discharge Observations: Generalized weakness remains. Pt able to isolate LEs musculature w/ strong contraction during MMT, however demonstrates poor muscular endurance.  Mobility Bed Mobility Bed Mobility: Rolling Right;Rolling Left;Sit to Supine;Supine to Sit Rolling Right: Supervision/verbal cueing Rolling Left: Supervision/Verbal cueing Supine to Sit: Minimal Assistance - Patient > 75% Sit to Supine: Contact Guard/Touching assist Transfers Transfers: Sit to Stand;Stand to Sit;Stand Pivot Transfers Sit to Stand: Contact Guard/Touching assist Stand to Sit: Contact Guard/Touching assist Stand Pivot Transfers: Contact Guard/Touching assist Stand Pivot Transfer Details: Tactile cues for initiation;Tactile cues for weight shifting;Tactile cues for posture;Tactile cues for placement Transfer (Assistive device): Rolling walker Locomotion  Gait Ambulation: Yes Gait Assistance: Contact Guard/Touching assist Gait Distance (Feet): 75 Feet Assistive device: Rolling  walker Gait Assistance Details: Tactile cues for initiation;Verbal cues for safe use of DME/AE;Verbal cues for gait pattern;Verbal cues for technique;Verbal cues for precautions/safety Gait Gait: Yes Gait Pattern: Impaired Gait Pattern: Trunk flexed;Narrow base of support Gait velocity: decreased Stairs / Additional Locomotion Stairs: Yes Stairs Assistance: Contact Guard/Touching assist Stair Management Technique: One rail Left  Number of Stairs: 4 Height of Stairs: 6 Wheelchair Mobility Wheelchair Mobility: Yes Wheelchair Assistance: Chartered loss adjuster: Both upper extremities Wheelchair Parts Management: Needs assistance Distance: 150'  Trunk/Postural Assessment  Cervical Assessment Cervical Assessment: Exceptions to WFL(forward head) Thoracic Assessment Thoracic Assessment: Exceptions to WFL(rounded shoulders) Lumbar Assessment Lumbar Assessment: Exceptions to WFL(posterior pelvic tilt) Postural Control Postural Control: Deficits on evaluation(posterior lean bias, although improved since eval)  Balance Balance Balance Assessed: Yes Static Sitting Balance Static Sitting - Balance Support: Feet supported;No upper extremity supported Static Sitting - Level of Assistance: 5: Stand by assistance Dynamic Sitting Balance Dynamic Sitting - Balance Support: No upper extremity supported;Feet supported Dynamic Sitting - Level of Assistance: 5: Stand by assistance Static Standing Balance Static Standing - Balance Support: Bilateral upper extremity supported;During functional activity Static Standing - Level of Assistance: 5: Stand by assistance Dynamic Standing Balance Dynamic Standing - Balance Support: During functional activity;No upper extremity supported Dynamic Standing - Level of Assistance: 4: Min assist(CGA) Extremity Assessment  RLE Assessment RLE Assessment: Within Functional Limits(decreased muscular endurance) LLE Assessment LLE  Assessment: Within Functional Limits(decreased muscular endurance)    Javonte Elenes K Atzel Mccambridge 02/27/2019, 10:39 AM

## 2019-02-27 NOTE — Progress Notes (Signed)
Speech Language Pathology Discharge Summary  Patient Details  Name: Danielle Clarke MRN: [id number] Date of Birth: 1941-04-25  Today's Date: 02/27/2019 SLP Individual Time: 1107-1205 SLP Individual Time Calculation (min): 58 min   Skilled Therapeutic Interventions:  Pt was seen for skilled ST targeting family education with pt's daughter, Claudine Mouton.  SLP discussed pt's current progress and goals in therapy.  SLP provided skilled education regarding strategies to maximize attention to task, recall of daily information, and functional independence for problem solving.  SLP also provided skilled education regarding word finding strategies and techniques to maximize pt's independence for functional communication.  SLP provided handouts to facilitate carryover of compensatory strategies in the home environment and also provided handouts of activities for cognitive-linguistic remediation and compensation.  SLP reinforced team's recommendations that pt have 24/7 supervision at discharge in addition to [facility] follow up at discharge.  SLP provided worksheets that pt and daughter can do together at home should her home health follow up be delayed due to current health and safety restrictions.  All questions were answered to pt's daughter's satisfaction at this time.  Pt is ready for discharge tomorrow.      Patient has met 6 of 9 long term goals.  Patient to discharge at overall Mod level.    Clinical Impression/Discharge Summary:   Pt has made slow, limited gains while inpatient and is discharging having met 6 out of 9 long term goals.  Pt is currently mod assist for tasks due to moderately severe cognitive-linguistic deficits and is consuming dys 3, thin liquids diet with supervision assist for use of swallowing precautions.  Pt and family education is complete at this time.  Pt has demonstrated improved toleration of solid textures as evidenced by fewer instances of pt exhibiting globus sensation.  Pt is  discharging home with recommendations for ST follow up at next level of care and 24/7 supervision from family.    Care Partner:  Caregiver Able to Provide Assistance: Yes  Type of Caregiver Assistance: Physical;Cognitive  Recommendation:  Home Health SLP;24 hour supervision/assistance  Rationale for SLP Follow Up: Maximize functional communication;Maximize cognitive function and independence;Reduce caregiver burden;Maximize swallowing safety   Equipment: none recommended by SLP    Reasons for discharge: Discharged from hospital   Patient/Family Agrees with Progress Made and Goals Achieved: Yes    Brittne Kawasaki, Selinda Orion 02/27/2019, 12:57 PM

## 2019-02-27 NOTE — Progress Notes (Signed)
[facility] PHYSICAL MEDICINE & REHABILITATION PROGRESS NOTE  Subjective/Complaints: Patient seen laying in bed this morning.  She states she slept well overnight.  She is aware of discharge plans for tomorrow.  Daughter at bedside.  She is questions regarding prognosis.  ROS: Denies CP, shortness of breath, nausea, vomiting, diarrhea.  Objective: Vital Signs: Blood pressure (!) 148/51, pulse 78, temperature 98.2 F (36.8 C), temperature source Oral, resp. rate 16, height 5' (1.524 m), weight 48.9 kg, SpO2 100 %. No results found. No results for input(s): WBC, HGB, HCT, PLT in the last 72 hours. Recent Labs    02/25/19 0612  NA 136  K 4.4  CL 99  CO2 25  GLUCOSE 175*  BUN 15  CREATININE 0.71  CALCIUM 8.9    Physical Exam: BP (!) 148/51 (BP Location: Right Arm)   Pulse 78   Temp 98.2 F (36.8 C) (Oral)   Resp 16   Ht 5' (1.524 m)   Wt 48.9 kg   SpO2 100%   BMI 21.05 kg/m  Constitutional: No distress . Vital signs reviewed. HENT: Normocephalic.  Atraumatic. Eyes: EOMI. No discharge. Cardiovascular: RRR.  No JVD.  + Murmur Respiratory: CTA bilaterally.  Normal effort. GI: BS +. Non-distended. Musc: No edema or tenderness in extremities. Neurological: Alert  Slow to process, overall improving, however variable at times.  Motor: Grossly 4+/5 throughout, left stronger than right Skin: Skin iswarmand dry.  Psychiatric: Normal mood.  Normal affect.  Assessment/Plan: 1. Functional deficits secondary to traumatic left frontal intraparenchymal hematoma which require 3+ hours per day of interdisciplinary therapy in a comprehensive inpatient rehab setting.  Physiatrist is providing close team supervision and 24 hour management of active medical problems listed below.  Physiatrist and rehab team continue to assess barriers to discharge/monitor patient progress toward functional and medical goals  Care Tool:  Bathing    Body parts bathed by patient: Right arm, Left  arm, Chest, Abdomen, Front perineal area, Buttocks, Right upper leg, Left upper leg, Right lower leg, Left lower leg, Face   Body parts bathed by helper: Buttocks     Bathing assist Assist Level: Contact Guard/Touching assist     Upper Body Dressing/Undressing Upper body dressing   What is the patient wearing?: Pull over shirt    Upper body assist Assist Level: Supervision/Verbal cueing    Lower Body Dressing/Undressing Lower body dressing      What is the patient wearing?: Pants     Lower body assist Assist for lower body dressing: Moderate Assistance - Patient 50 - 74%     Toileting Toileting    Toileting assist Assist for toileting: Minimal Assistance - Patient > 75%     Transfers Chair/bed transfer  Transfers assist     Chair/bed transfer assist level: Minimal Assistance - Patient > 75%     Locomotion Ambulation   Ambulation assist      Assist level: Supervision/Verbal cueing Assistive device: Walker-rolling Max distance: 10'   Walk 10 feet activity   Assist     Assist level: Supervision/Verbal cueing Assistive device: Walker-rolling   Walk 50 feet activity   Assist Walk 50 feet with 2 turns activity did not occur: Safety/medical concerns  Assist level: Contact Guard/Touching assist Assistive device: Walker-rolling    Walk 150 feet activity   Assist Walk 150 feet activity did not occur: Safety/medical concerns  Assist level: Contact Guard/Touching assist Assistive device: Walker-rolling    Walk 10 feet on uneven surface  activity   Assist  Walk 10 feet on uneven surfaces activity did not occur: Safety/medical concerns         Wheelchair     Assist Will patient use wheelchair at discharge?: (TBD) Type of Wheelchair: Manual    Wheelchair assist level: Supervision/Verbal cueing Max wheelchair distance: 125'    Wheelchair 50 feet with 2 turns activity    Assist        Assist Level: Supervision/Verbal cueing    Wheelchair 150 feet activity     Assist Wheelchair 150 feet activity did not occur: Safety/medical concerns          Medical Problem List and Plan: 1.Decreased functional mobilitysecondary to traumatic left frontal intraparenchymal hematoma  Continue CIR  Plan for d/c tomorrow  Will communicate with patient for transitional care management in 1-2 weeks post-discharge 2. Antithrombotics: -DVT/anticoagulation:SCDs -antiplatelet therapy: None secondary to intraparenchymal hematoma 3. Pain Management:Lidoderm patch 4. Mood:Provide emotional support -antipsychotic agents: none 5. Neuropsych: This patientis not fully capable of making decisions on herown behalf. 6. Skin/Wound Care:Routine skin checks 7. Fluids/Electrolytes/Nutrition:Routine in and out's 8. 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 Follow-up per EP  9. Moderate severe aortic stenosis. Continue Lopressor 25 mg twice a day -follow for tolerance of physical activity with therapies 10. Dysphagia:   On D3 thins  Advance diet as tolerated  D/c IVF, improving fluid intake 11. Intermittent coffee-ground emesis. 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 Continue PPI.  Follow-up gastroenterology services currently with conservative care.  Hemoglobin 10.8 on 3/30  Continue to monitor 12. Diabetes mellitus  Patient had been on Tresiba 10 units daily prior to admission.  Lantus insulin10 units twice daily, decreased to daily on 3/24, increased to 14 units on 3/29, decreased to 12 units on 4/1, increased to 13 units on 4/3  Novolog changed to SSI with no bedtime coverage on 4/1  Check blood sugars before meals and at bedtime.    CBG (last 3)  Recent Labs    02/26/19 1735 02/26/19 2051 02/27/19 0651  GLUCAP 116* 268* 299*   Labile, however less labile than previously.  We will continue to require ambulatory monitoring with  adjustments.  Bedtime snack ordered, discussed with nursing  Continue am dose metformin  13. Hypoalbuminemia  Supplement initiated on 3/23 14.  Right pleural effusion  Continue to monitor 15.  Rib fractures  Right 6-8  Conservative care 16.  Leukocytosis: Resolved  WBCs 7.6 on 3/30  Low-grade fever resolved  Off macrobid 17.  Essential hypertension   Vitals:   02/26/19 2000 02/27/19 0533  BP: (!) 133/41 (!) 148/51  Pulse: 74 78  Resp:  16  Temp:  98.2 F (36.8 C)  SpO2: 98% 100%   Continue metoprolol 25 twice daily  Lisinopril 2.5 nightly started on 3/31  Slightly labile, but overall improved on 4/2, will continue to require better monitoring for further adjustments 18.  Hx Lethargy-improved  Multifactorial- TBI, Urine cx neg d/c macrobid   Will consider repeat CT if decline in status,- appears to be improving slowly 19.  Hypokalemia: Resolved  Potassium 4.4 on 3/31   LOS: 13 days A FACE TO FACE EVALUATION WAS PERFORMED  Danielle Clarke Karis Juba 02/27/2019, 10:09 AM

## 2019-02-27 NOTE — Progress Notes (Addendum)
Results for KANASIA, OLEKSA (MRN [id number]) as of 02/27/2019 13:44  Ref. Range 02/26/2019 20:51 02/27/2019 06:51 02/27/2019 11:49 02/27/2019 12:21  Glucose-Capillary Latest Ref Range: 70 - 99 mg/dL 950 (H) 932 (H) 671 (H) 366 (H)  Noted that blood sugars have been greater than 350 mg/dl.   Recommend increasing Lantus to 14 units daily,  Novolog 0-15 units every 4 hours if blood sugars continue to be elevated, and consider adding Novolog 4 units TID to cover nutritional supplements since CBGs trend up after getting the supplements.   Smith Mince RN BSN CDE Diabetes Coordinator Pager: [phone]  8am-5pm

## 2019-02-27 NOTE — Discharge Summary (Addendum)
Physician Discharge Summary  Patient ID: Danielle Clarke MRN: [id number] DOB/AGE: [birth year] 78 y.o.  Admit date: 02/14/2019 Discharge date: 02/28/2019  Discharge Diagnoses:  Active Problems:   Traumatic cerebral intraparenchymal hematoma (HCC)   Multiple closed fractures of ribs of right side   Pleural effusion on right   Hypoalbuminemia due to protein-calorie malnutrition (HCC)   Dysphagia   Low grade fever   Hypoglycemia   Acute blood loss anemia   Leukocytosis   Lethargy   Labile blood pressure   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   Hypokalemia   Hypertensive crisis   Discharged Condition: stable  Significant Diagnostic Studies: Dg Chest 2 View  Result Date: 02/11/2019 CLINICAL DATA:  Pacemaker placement EXAM: CHEST - 2 VIEW COMPARISON:  Three days ago FINDINGS: Interval dual-chamber pacer from the left with leads over the right atrial appendage and right ventricle. Increased hazy density of the inferior chest attributed to small to moderate pleural effusions. No Kerley lines. No pneumothorax. Normal heart size. A feeding tube at least reaches the stomach. IMPRESSION: 1. New dual-chamber pacer without complicating feature. 2. New small to moderate pleural effusions. Electronically Signed   By: Marnee Spring M.D.   On: 02/11/2019 06:23   Ct Head Wo Contrast  Result Date: 02/16/2019 CLINICAL DATA:  Intracranial hemorrhage. EXAM: CT HEAD WITHOUT CONTRAST TECHNIQUE: Contiguous axial images were obtained from the base of the skull through the vertex without intravenous contrast. COMPARISON:  CT head without contrast 02/11/2019 and 02/07/2019. FINDINGS: Brain: There is expected evolution of a large anterior left frontal lobe hemorrhage. Surrounding edema is present. Midline shift is increased, now measuring 8 mm. There is partial effacement the ventral horn of the left lateral ventricle. Left subdural blood has slightly decreased. Hemorrhagic contusions  are again noted within the anterior temporal lobes bilaterally. No new hemorrhage is present. Basal ganglia are intact. Brainstem and cerebellum are normal. Vascular: Atherosclerotic calcifications are present within the cavernous internal carotid arteries and at the dural margin of both vertebral arteries. There is no hyperdense vessel. Skull: Calvarium is intact. No focal lytic or blastic lesions are present. Sinuses/Orbits: A single anterior right ethmoid air cell is opacified. Bilateral lens replacements are noted. Globes and orbits are otherwise unremarkable. IMPRESSION: 1. Expected evolution of anterior left frontal lobe hemorrhagic contusion. 2. Decreasing left frontal subdural blood. 3. Persistent surrounding edema with slight increase in midline shift. 4. Expected evolution of bilateral temporal tip hemorrhagic contusions. 5. No new hemorrhage. Electronically Signed   By: Marin Roberts M.D.   On: 02/16/2019 21:07   Ct Head Wo Contrast  Result Date: 02/11/2019 CLINICAL DATA:  Follow-up examination for intracranial hemorrhage. EXAM: CT HEAD WITHOUT CONTRAST TECHNIQUE: Contiguous axial images were obtained from the base of the skull through the vertex without intravenous contrast. COMPARISON:  Prior CT from 02/07/2019 FINDINGS: Brain: There has been slight interval decrease in size of the dominant left frontal intraparenchymal hemorrhage, measuring up to approximately 4 cm in maximal diameter. Surrounding low-density vasogenic edema has increased with worsened regional mass effect. Mass effect on the adjacent frontal horn of the left lateral ventricle with up to 6 mm of left-to-right shift, increased from previous. No hydrocephalus or ventricular trapping. Additional small hemorrhagic contusions involving the anterior temporal poles relatively similar in size without significant regional mass effect. Scattered subarachnoid hemorrhage has decreased from previous. Extra-axial hemorrhage overlying the  left frontal convexity relatively similar measuring up to 7 mm in maximal thickness. Subdural hemorrhage overlying the left parietal  convexity relatively similar as well measuring up to 6 mm in maximal thickness, although a slightly less dense as compared to previous exam. There has been interval development of a small subdural hygroma overlying the right cerebral hemisphere measuring up to 5 mm. Remainder the brain is stable in appearance. No new intracranial hemorrhage. No acute large vessel territory infarct. Vascular: No hyperdense vessel. Calcified atherosclerosis noted at the skull base. Skull: Decreasing right parietal scalp contusion. Calvarium unchanged. Sinuses/Orbits: Globes and orbital soft tissues demonstrate no acute finding. Paranasal sinuses remain clear. Nasogastric tube noted. No mastoid effusion. Other: None. IMPRESSION: 1. Overall interval decrease in size of dominant 4 cm left frontal intraparenchymal hemorrhage, but with increased surrounding edema and regional mass effect. Associated left-to-right shift now measures up to 6 mm. No hydrocephalus or ventricular trapping. 2. Similar parenchymal contusions involving the anterior temporal poles bilaterally, with decreased subarachnoid hemorrhage as compared to previous. Subdural hemorrhage overlying the left cerebral convexity little interval changed. 3. Interval development of 5 mm right subdural hygroma. Electronically Signed   By: Rise Mu M.D.   On: 02/11/2019 17:15   Ct Head Wo Contrast  Result Date: 02/07/2019 CLINICAL DATA:  Intracranial hemorrhage with new vomiting and change in pupil EXAM: CT HEAD WITHOUT CONTRAST TECHNIQUE: Contiguous axial images were obtained from the base of the skull through the vertex without intravenous contrast. COMPARISON:  Brain MRI from yesterday FINDINGS: Brain: Mixed mainly high-density hematoma in the left frontal lobe unchanged from MRI at up to 4 cm diameter. Increase in regional edema with  local mass effect. Scattered subarachnoid hemorrhage. Subdural hematoma along the left cerebral convexity measuring up to 6 mm in thickness, not increased. Hemorrhagic contusion in the right temporal pole with mild local subarachnoid hemorrhage. No appreciable mass effect in this area. Midline shift measures up to 4 mm at the anterior septum pellucidum. Vascular: Negative Skull: Right parietal scalp hematoma. Sinuses/Orbits: Bilateral cataract resection. IMPRESSION: No increase in multifocal intracranial hemorrhage, but there is increased swelling around the 4 cm left frontal hematoma with midline shift measuring up to 4 mm. No herniation or entrapment. Electronically Signed   By: Marnee Spring M.D.   On: 02/07/2019 05:22   Ct Head Wo Contrast  Result Date: 02/06/2019 CLINICAL DATA:  Fall EXAM: CT HEAD WITHOUT CONTRAST CT CERVICAL SPINE WITHOUT CONTRAST TECHNIQUE: Multidetector CT imaging of the head and cervical spine was performed following the standard protocol without intravenous contrast. Multiplanar CT image reconstructions of the cervical spine were also generated. COMPARISON:  Head CT 01/04/2017 FINDINGS: CT HEAD FINDINGS Brain: There is a left frontal intraparenchymal hematoma measuring 2.2 x 2.2 x 3.9 cm (volume = 9.9 cm^3) with moderate surrounding edema. There is mixed subdural and subarachnoid blood over the left convexity, extending into the sylvian fissure. Smaller amount of subarachnoid blood the right sylvian fissure. No midline shift or other significant mass effect. No hydrocephalus. Vascular: No abnormal hyperdensity of the major intracranial arteries or dural venous sinuses. No intracranial atherosclerosis. Skull: Large right parietal posterior scalp hematoma. Sinuses/Orbits: No fluid levels or advanced mucosal thickening of the visualized paranasal sinuses. No mastoid or middle ear effusion. The orbits are normal. CT CERVICAL SPINE FINDINGS Alignment: No static subluxation. Facets are  aligned. Occipital condyles are normally positioned. Skull base and vertebrae: There is an old type 2 fracture of the dens with chronic nonunion. There is there is C3-C6 anterior fusion with solid anterior fusion mass. No acute fracture. Soft tissues and spinal canal: No prevertebral fluid  or swelling. No visible canal hematoma. Disc levels: No bony spinal canal stenosis. Upper chest: No pneumothorax, pulmonary nodule or pleural effusion. Other: Normal visualized paraspinal cervical soft tissues. IMPRESSION: 1. Left frontal intraparenchymal hematoma with volume approximately 10 mL and mild surrounding edema with small volume subarachnoid/subdural blood over both convexities, left-greater-than-right. 2. Posterior right parietal scalp hematoma, consistent with a contrecoup pattern of injury. 3. No acute fracture or static subluxation of the cervical spine. 4. 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 Critical Value/emergent results were called by telephone at the time of interpretation on 02/06/2019 at 8:41 pm to Dr. Mancel Bale , who verbally acknowledged these results. Electronically Signed   By: Deatra Robinson M.D.   On: 02/06/2019 20:47   Ct Chest W Contrast  Result Date: 02/06/2019 CLINICAL DATA:  Blunt abdominal trauma. EXAM: CT CHEST, ABDOMEN, AND PELVIS WITH CONTRAST TECHNIQUE: Multidetector CT imaging of the chest, abdomen and pelvis was performed following the standard protocol during bolus administration of intravenous contrast. CONTRAST:  OMNIPAQUE IOHEXOL 300 MG/ML  SOLN COMPARISON:  CT chest 08/06/2017. FINDINGS: CT CHEST FINDINGS Cardiovascular: Normal heart size. No pericardial effusions. Coronary artery and mitral valve annulus calcifications. Normal caliber thoracic aorta with calcifications. No aortic dissection. Central pulmonary arteries are well opacified without evidence of significant pulmonary embolus. Mediastinum/Nodes: Esophagus is decompressed. No significant  lymphadenopathy in the chest. No abnormal mediastinal gas or fluid collections. Lungs/Pleura: Atelectasis in the lung bases. No focal consolidation. No pleural effusions. No pneumothorax. Airways are patent. Musculoskeletal: Postoperative changes with lower cervical anterior fixation and right shoulder arthroplasty. Thoracolumbar scoliosis convex towards the right in the thoracic region and towards the left in the lumbar region. Diffuse degenerative changes throughout the thoracic spine. No vertebral compression deformities. Sternum and ribs appear intact. No acute displaced fractures identified. CT ABDOMEN PELVIS FINDINGS Hepatobiliary: No hepatic injury or perihepatic hematoma. Gallbladder is unremarkable Pancreas: Unremarkable. No pancreatic ductal dilatation or surrounding inflammatory changes. Spleen: No splenic injury or perisplenic hematoma. Adrenals/Urinary Tract: No adrenal hemorrhage or renal injury identified. Bladder is unremarkable. Stomach/Bowel: Stomach, small bowel, and colon are not abnormally distended. Stool fills the colon. No wall thickening or inflammatory changes appreciated. No mesenteric hematoma or gas collections. Appendix is not identified. Vascular/Lymphatic: Aortic atherosclerosis. No enlarged abdominal or pelvic lymph nodes. Reproductive: Uterus and ovaries are not enlarged. Calcifications in the uterus likely representing calcified fibroids. Other: No free air or free fluid in the abdomen. Abdominal wall musculature appears intact. Musculoskeletal: Thoracolumbar scoliosis. No vertebral compression deformities. Degenerative changes throughout the lumbar spine with narrowed interspaces and endplate hypertrophic changes. Sacrum, pelvis, and hips appear intact. No acute fractures are identified. IMPRESSION: 1. No acute posttraumatic changes demonstrated in the chest, abdomen, or pelvis. 2. Atelectasis in the lung bases. 3. No evidence of mediastinal injury or pulmonary parenchymal injury.  4. No evidence of solid organ injury or bowel perforation. 5. Thoracolumbar scoliosis and degenerative changes. 6. Calcified uterine fibroids. 7. Aortic atherosclerosis. Electronically Signed   By: Burman Nieves M.D.   On: 02/06/2019 20:53   Ct Cervical Spine Wo Contrast  Result Date: 02/06/2019 CLINICAL DATA:  Fall EXAM: CT HEAD WITHOUT CONTRAST CT CERVICAL SPINE WITHOUT CONTRAST TECHNIQUE: Multidetector CT imaging of the head and cervical spine was performed following the standard protocol without intravenous contrast. Multiplanar CT image reconstructions of the cervical spine were also generated. COMPARISON:  Head CT 01/04/2017 FINDINGS: CT HEAD FINDINGS Brain: There is a left frontal intraparenchymal hematoma measuring 2.2 x  2.2 x 3.9 cm (volume = 9.9 cm^3) with moderate surrounding edema. There is mixed subdural and subarachnoid blood over the left convexity, extending into the sylvian fissure. Smaller amount of subarachnoid blood the right sylvian fissure. No midline shift or other significant mass effect. No hydrocephalus. Vascular: No abnormal hyperdensity of the major intracranial arteries or dural venous sinuses. No intracranial atherosclerosis. Skull: Large right parietal posterior scalp hematoma. Sinuses/Orbits: No fluid levels or advanced mucosal thickening of the visualized paranasal sinuses. No mastoid or middle ear effusion. The orbits are normal. CT CERVICAL SPINE FINDINGS Alignment: No static subluxation. Facets are aligned. Occipital condyles are normally positioned. Skull base and vertebrae: There is an old type 2 fracture of the dens with chronic nonunion. There is there is C3-C6 anterior fusion with solid anterior fusion mass. No acute fracture. Soft tissues and spinal canal: No prevertebral fluid or swelling. No visible canal hematoma. Disc levels: No bony spinal canal stenosis. Upper chest: No pneumothorax, pulmonary nodule or pleural effusion. Other: Normal visualized paraspinal  cervical soft tissues. IMPRESSION: 1. Left frontal intraparenchymal hematoma with volume approximately 10 mL and mild surrounding edema with small volume subarachnoid/subdural blood over both convexities, left-greater-than-right. 2. Posterior right parietal scalp hematoma, consistent with a contrecoup pattern of injury. 3. No acute fracture or static subluxation of the cervical spine. 4. 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 Critical Value/emergent results were called by telephone at the time of interpretation on 02/06/2019 at 8:41 pm to Dr. Mancel Bale , who verbally acknowledged these results. Electronically Signed   By: Deatra Robinson M.D.   On: 02/06/2019 20:47   Mr Maxine Glenn Head Wo Contrast  Result Date: 02/07/2019 CLINICAL DATA:  Initial evaluation for acute syncope, fall, traumatic intracranial hemorrhage. EXAM: MRI HEAD WITHOUT AND WITH CONTRAST MRA HEAD WITHOUT CONTRAST TECHNIQUE: Multiplanar, multiecho pulse sequences of the brain and surrounding structures were obtained without and with intravenous contrast. Angiographic images of the head were obtained using MRA technique without contrast. CONTRAST:  5 cc of Gadavist. COMPARISON:  Prior CT from earlier the same day. FINDINGS: MRI HEAD FINDINGS Brain: Examination mildly degraded by motion artifact. Intraparenchymal hematoma centered at the anterior left frontal lobe again seen, measuring 3.9 x 4.1 x 5.1 cm (estimated volume 40 cc, previously 10 cc). Internal fluid fluid level. Localized edema and regional mass effect mildly increased, with partial effacement of the frontal horn of the adjacent left lateral ventricle. Mild 4 mm left-to-right midline shift. Additional small hemorrhagic contusions at the temporal poles bilaterally, measuring 17 mm on the right (series 26, image 21) and 14 mm on the left (series 26, image 19). Localized edema without significant regional mass effect. Moderate volume subarachnoid hemorrhage involving the  left greater than right cerebral hemispheres with extension into the sylvian fissures. Small amount of subarachnoid seen within the interpeduncular cistern. Small volume intraventricular hemorrhage now seen, likely related to redistribution. No hydrocephalus or ventricular trapping. Left holo hemispheric subdural hematoma measures up to 6 mm in maximal thickness at the level of the left parietal convexity. Smaller right convexity subdural hemorrhage measures up to 3 mm in maximal thickness. Mild extension along the falx noted. Underlying age-related cerebral atrophy. Patchy T2/FLAIR hyperintensity within the bilateral thalami, likely related to chronic microvascular ischemic disease, mild for age. No evidence for acute or subacute infarct. Gray-white matter differentiation otherwise maintained. No areas of chronic infarction. No mass lesion. Minimal patchy enhancement underlying the left frontal hematoma felt to be reactive in nature. No underlying  discrete mass identified. No other abnormal enhancement within the brain. Pituitary gland suprasellar region normal. Midline structures intact. Vascular: Major intracranial vascular flow voids maintained. Skull and upper cervical spine: Craniocervical junction within normal limits. Postsurgical changes noted within the upper cervical spine. Bone marrow signal intensity within normal limits. Left parietal scalp contusion with superimposed 13 mm soft tissue hematoma noted. Patient status post bilateral ocular lens replacement. Paranasal sinuses are largely clear. Right concha bullosa with associated right-to-left nasal septal deviation noted. No mastoid effusion. Inner ear structures grossly normal. Sinuses/Orbits: None. Other: None. MRA HEAD FINDINGS ANTERIOR CIRCULATION: Examination mildly degraded by motion artifact. Distal cervical segments of the internal carotid arteries are widely patent with symmetric antegrade flow. Petrous, cavernous, and supraclinoid segments  patent with scattered atheromatous irregularity. Probable short-segment moderate stenoses at the anterior cavernous right ICA and supraclinoid left ICA. Apparent 3 mm outpouching arising at the region of the right ophthalmic artery, which could reflect a small aneurysm or vascular infundibulum (series 7, image 93). Finding not entirely certain on this motion degraded exam. ICA termini well perfused. A1 segments widely patent bilaterally. Normal anterior communicating artery. Anterior cerebral arteries patent to their distal aspects without stenosis. M1 segments patent bilaterally. Normal MCA bifurcations. Distal MCA branches well perfused and symmetric. No vascular abnormality seen underlying the left frontal hematoma. POSTERIOR CIRCULATION: Vertebral arteries patent to the vertebrobasilar junction without flow-limiting stenosis. Left PICA patent. Right PICA not seen. Probable short fenestration at the proximal basilar artery noted. Basilar patent to its distal aspect without stenosis. Superior cerebral arteries patent bilaterally. PCA supplied via the basilar as well as small bilateral posterior communicating arteries. PCAs patent to their distal aspects without appreciable stenosis. IMPRESSION: MRI HEAD IMPRESSION: 1. Interval expansion of left frontal intraparenchymal hematoma, now measuring up to 40 cc in volume (previously 10 cc). Localized edema and regional mass effect mildly increased. No underlying mass lesion or other abnormality. 2. Additional small hemorrhagic contusions at the anterior temporal poles bilaterally as above. 3. Moderate volume traumatic subarachnoid hemorrhage involving the left greater than right cerebral hemispheres with small left greater than right subdural hematomas. Associated mild 4 mm left-to-right midline shift. No hydrocephalus or ventricular trapping. 4. Evolving right parietal scalp contusion. 5. Otherwise negative brain MRI. No other findings to explain intracranial hemorrhage  identified. MRA HEAD IMPRESSION: 1. Technically limited exam due to motion artifact. 2. No vascular abnormality underlying the left frontal intraparenchymal hematoma identified. 3. 3 mm focal outpouching arising from the cavernous right ICA, which could reflect a small aneurysm versus vascular infundibulum, difficult to be certain given motion artifact on this exam. Either way, finding felt to most likely be incidental in nature, and unrelated to the acute intracranial hemorrhage. 4. Atherosclerotic change within the carotid siphons with associated moderate multifocal stenoses as above. Electronically Signed   By: Rise Mu M.D.   On: 02/07/2019 01:09   Mr Laqueta Jean ZO Contrast  Result Date: 02/07/2019 CLINICAL DATA:  Initial evaluation for acute syncope, fall, traumatic intracranial hemorrhage. EXAM: MRI HEAD WITHOUT AND WITH CONTRAST MRA HEAD WITHOUT CONTRAST TECHNIQUE: Multiplanar, multiecho pulse sequences of the brain and surrounding structures were obtained without and with intravenous contrast. Angiographic images of the head were obtained using MRA technique without contrast. CONTRAST:  5 cc of Gadavist. COMPARISON:  Prior CT from earlier the same day. FINDINGS: MRI HEAD FINDINGS Brain: Examination mildly degraded by motion artifact. Intraparenchymal hematoma centered at the anterior left frontal lobe again seen, measuring 3.9 x 4.1 x  5.1 cm (estimated volume 40 cc, previously 10 cc). Internal fluid fluid level. Localized edema and regional mass effect mildly increased, with partial effacement of the frontal horn of the adjacent left lateral ventricle. Mild 4 mm left-to-right midline shift. Additional small hemorrhagic contusions at the temporal poles bilaterally, measuring 17 mm on the right (series 26, image 21) and 14 mm on the left (series 26, image 19). Localized edema without significant regional mass effect. Moderate volume subarachnoid hemorrhage involving the left greater than right  cerebral hemispheres with extension into the sylvian fissures. Small amount of subarachnoid seen within the interpeduncular cistern. Small volume intraventricular hemorrhage now seen, likely related to redistribution. No hydrocephalus or ventricular trapping. Left holo hemispheric subdural hematoma measures up to 6 mm in maximal thickness at the level of the left parietal convexity. Smaller right convexity subdural hemorrhage measures up to 3 mm in maximal thickness. Mild extension along the falx noted. Underlying age-related cerebral atrophy. Patchy T2/FLAIR hyperintensity within the bilateral thalami, likely related to chronic microvascular ischemic disease, mild for age. No evidence for acute or subacute infarct. Gray-white matter differentiation otherwise maintained. No areas of chronic infarction. No mass lesion. Minimal patchy enhancement underlying the left frontal hematoma felt to be reactive in nature. No underlying discrete mass identified. No other abnormal enhancement within the brain. Pituitary gland suprasellar region normal. Midline structures intact. Vascular: Major intracranial vascular flow voids maintained. Skull and upper cervical spine: Craniocervical junction within normal limits. Postsurgical changes noted within the upper cervical spine. Bone marrow signal intensity within normal limits. Left parietal scalp contusion with superimposed 13 mm soft tissue hematoma noted. Patient status post bilateral ocular lens replacement. Paranasal sinuses are largely clear. Right concha bullosa with associated right-to-left nasal septal deviation noted. No mastoid effusion. Inner ear structures grossly normal. Sinuses/Orbits: None. Other: None. MRA HEAD FINDINGS ANTERIOR CIRCULATION: Examination mildly degraded by motion artifact. Distal cervical segments of the internal carotid arteries are widely patent with symmetric antegrade flow. Petrous, cavernous, and supraclinoid segments patent with scattered  atheromatous irregularity. Probable short-segment moderate stenoses at the anterior cavernous right ICA and supraclinoid left ICA. Apparent 3 mm outpouching arising at the region of the right ophthalmic artery, which could reflect a small aneurysm or vascular infundibulum (series 7, image 93). Finding not entirely certain on this motion degraded exam. ICA termini well perfused. A1 segments widely patent bilaterally. Normal anterior communicating artery. Anterior cerebral arteries patent to their distal aspects without stenosis. M1 segments patent bilaterally. Normal MCA bifurcations. Distal MCA branches well perfused and symmetric. No vascular abnormality seen underlying the left frontal hematoma. POSTERIOR CIRCULATION: Vertebral arteries patent to the vertebrobasilar junction without flow-limiting stenosis. Left PICA patent. Right PICA not seen. Probable short fenestration at the proximal basilar artery noted. Basilar patent to its distal aspect without stenosis. Superior cerebral arteries patent bilaterally. PCA supplied via the basilar as well as small bilateral posterior communicating arteries. PCAs patent to their distal aspects without appreciable stenosis. IMPRESSION: MRI HEAD IMPRESSION: 1. Interval expansion of left frontal intraparenchymal hematoma, now measuring up to 40 cc in volume (previously 10 cc). Localized edema and regional mass effect mildly increased. No underlying mass lesion or other abnormality. 2. Additional small hemorrhagic contusions at the anterior temporal poles bilaterally as above. 3. Moderate volume traumatic subarachnoid hemorrhage involving the left greater than right cerebral hemispheres with small left greater than right subdural hematomas. Associated mild 4 mm left-to-right midline shift. No hydrocephalus or ventricular trapping. 4. Evolving right parietal scalp contusion. 5. Otherwise  negative brain MRI. No other findings to explain intracranial hemorrhage identified. MRA HEAD  IMPRESSION: 1. Technically limited exam due to motion artifact. 2. No vascular abnormality underlying the left frontal intraparenchymal hematoma identified. 3. 3 mm focal outpouching arising from the cavernous right ICA, which could reflect a small aneurysm versus vascular infundibulum, difficult to be certain given motion artifact on this exam. Either way, finding felt to most likely be incidental in nature, and unrelated to the acute intracranial hemorrhage. 4. Atherosclerotic change within the carotid siphons with associated moderate multifocal stenoses as above. Electronically Signed   By: Rise Mu M.D.   On: 02/07/2019 01:09   Ct Abdomen Pelvis W Contrast  Result Date: 02/06/2019 CLINICAL DATA:  Blunt abdominal trauma. EXAM: CT CHEST, ABDOMEN, AND PELVIS WITH CONTRAST TECHNIQUE: Multidetector CT imaging of the chest, abdomen and pelvis was performed following the standard protocol during bolus administration of intravenous contrast. CONTRAST:  OMNIPAQUE IOHEXOL 300 MG/ML  SOLN COMPARISON:  CT chest 08/06/2017. FINDINGS: CT CHEST FINDINGS Cardiovascular: Normal heart size. No pericardial effusions. Coronary artery and mitral valve annulus calcifications. Normal caliber thoracic aorta with calcifications. No aortic dissection. Central pulmonary arteries are well opacified without evidence of significant pulmonary embolus. Mediastinum/Nodes: Esophagus is decompressed. No significant lymphadenopathy in the chest. No abnormal mediastinal gas or fluid collections. Lungs/Pleura: Atelectasis in the lung bases. No focal consolidation. No pleural effusions. No pneumothorax. Airways are patent. Musculoskeletal: Postoperative changes with lower cervical anterior fixation and right shoulder arthroplasty. Thoracolumbar scoliosis convex towards the right in the thoracic region and towards the left in the lumbar region. Diffuse degenerative changes throughout the thoracic spine. No vertebral compression  deformities. Sternum and ribs appear intact. No acute displaced fractures identified. CT ABDOMEN PELVIS FINDINGS Hepatobiliary: No hepatic injury or perihepatic hematoma. Gallbladder is unremarkable Pancreas: Unremarkable. No pancreatic ductal dilatation or surrounding inflammatory changes. Spleen: No splenic injury or perisplenic hematoma. Adrenals/Urinary Tract: No adrenal hemorrhage or renal injury identified. Bladder is unremarkable. Stomach/Bowel: Stomach, small bowel, and colon are not abnormally distended. Stool fills the colon. No wall thickening or inflammatory changes appreciated. No mesenteric hematoma or gas collections. Appendix is not identified. Vascular/Lymphatic: Aortic atherosclerosis. No enlarged abdominal or pelvic lymph nodes. Reproductive: Uterus and ovaries are not enlarged. Calcifications in the uterus likely representing calcified fibroids. Other: No free air or free fluid in the abdomen. Abdominal wall musculature appears intact. Musculoskeletal: Thoracolumbar scoliosis. No vertebral compression deformities. Degenerative changes throughout the lumbar spine with narrowed interspaces and endplate hypertrophic changes. Sacrum, pelvis, and hips appear intact. No acute fractures are identified. IMPRESSION: 1. No acute posttraumatic changes demonstrated in the chest, abdomen, or pelvis. 2. Atelectasis in the lung bases. 3. No evidence of mediastinal injury or pulmonary parenchymal injury. 4. No evidence of solid organ injury or bowel perforation. 5. Thoracolumbar scoliosis and degenerative changes. 6. Calcified uterine fibroids. 7. Aortic atherosclerosis. Electronically Signed   By: Burman Nieves M.D.   On: 02/06/2019 20:53   Dg Chest Port 1 View  Result Date: 02/16/2019 CLINICAL DATA:  Acute respiratory distress. EXAM: PORTABLE CHEST 1 VIEW COMPARISON:  02/16/2019 FINDINGS: 1921 hours. Left lung clear. Basilar atelectasis with probable layering effusion at the right base. The  cardiopericardial silhouette is within normal limits for size. Permanent pacemaker noted. Acute fractures of the posterior right sixth, seventh, and eighth ribs noted. No pneumothorax. Status post right shoulder replacement. IMPRESSION: 1. Stable exam with right basilar atelectasis and effusion. 2. Acute fractures posterior right sixth through eighth ribs. Electronically  Signed   By: Kennith Center M.D.   On: 02/16/2019 19:47   Dg Chest Port 1 View  Result Date: 02/16/2019 CLINICAL DATA:  Chest pain short of breath EXAM: PORTABLE CHEST 1 VIEW COMPARISON:  02/11/2019 FINDINGS: Dual lead pacemaker unchanged. Heart size upper normal. Negative for heart failure Small to moderate right pleural effusion similar to the prior study. Right lower lobe airspace disease unchanged. Improved aeration left lung which is now clear. IMPRESSION: Persistent right pleural effusion and right lower lobe airspace disease unchanged. Left lung base now clear. Electronically Signed   By: Marlan Palau M.D.   On: 02/16/2019 12:56   Dg Chest Port 1 View  Result Date: 02/08/2019 CLINICAL DATA:  Hypoxia.  History of diabetes. EXAM: PORTABLE CHEST 1 VIEW COMPARISON:  February 07, 2019 FINDINGS: Transcutaneous pacer leads overlie the left chest. No pneumothorax. The cardiomediastinal silhouette is normal. No pulmonary nodules or masses. No focal infiltrates. Probable atelectasis in the bases. No overt edema. IMPRESSION: No active disease. Electronically Signed   By: Gerome Sam III M.D   On: 02/08/2019 11:29   Dg Chest Port 1 View  Result Date: 02/07/2019 CLINICAL DATA:  Vomiting. Concern for aspiration. EXAM: PORTABLE CHEST 1 VIEW COMPARISON:  08/06/2017 FINDINGS: Normal heart size and pulmonary vascularity. Calcification in the mitral valve annulus. Lungs are clear. No blunting of costophrenic angles. No pneumothorax. Mediastinal contours appear intact. Calcification of the aorta. Postoperative changes in the cervical spine and  right shoulder. Lumbar scoliosis convex towards the left. IMPRESSION: No evidence of active pulmonary disease. Aortic atherosclerosis. Electronically Signed   By: Burman Nieves M.D.   On: 02/07/2019 03:35   Dg Swallowing Func-speech Pathology  Result Date: 02/11/2019 Objective Swallowing Evaluation: Type of Study: MBS-Modified Barium Swallow Study  Patient Details Name: Danielle Clarke MRN: [id number] Date of Birth: Dec 13, 1940 Today's Date: 02/11/2019 Time: SLP Start Time (ACUTE ONLY): 0913 -SLP Stop Time (ACUTE ONLY): 0930 SLP Time Calculation (min) (ACUTE ONLY): 17 min Past Medical History: Past Medical History: Diagnosis Date . Aortic stenosis, mild  . Arthritis  . Asthmatic bronchitis   with colds per patient . Carotid artery disease (HCC)   40-59% bilateral ICA stenosis . Cataract  . Cutaneous abscess of right foot  . Glaucoma  . Heart murmur   Dr Katrinka Blazing is her  cardiologist.  . Hyperlipemia  . Hypertension   Dr. Katrinka Blazing ~ 2 years ago . Neck fracture [facility])   july 2013 . Osteoporosis  . Syncope  . Type 1 diabetes mellitus (HCC)  Past Surgical History: Past Surgical History: Procedure Laterality Date . ANKLE FRACTURE SURGERY Left 2001  steel plate and 3 screws  . ANTERIOR CERVICAL CORPECTOMY N/A 07/31/2014  Procedure: Cervical Four to Cervical Six Corpectomy;  Surgeon: Karn Cassis, MD;  Location: [facility];  Service: Neurosurgery;  Laterality: N/A;  C4 to C6 Corpectomy . BREAST SURGERY  1988 . CATARACT EXTRACTION W/ INTRAOCULAR LENS  IMPLANT, BILATERAL Bilateral 2011  right and then left . EYE SURGERY   . FRACTURE SURGERY   . HAMMER TOE SURGERY  1998 . I&D EXTREMITY Right 09/27/2018  Procedure: IRRIGATION AND DEBRIDEMENT RIGHT FOOT;  Surgeon: Nadara Mustard, MD;  Location: [facility] OR;  Service: Orthopedics;  Laterality: Right; . JOINT REPLACEMENT   . PACEMAKER IMPLANT N/A 02/10/2019  Procedure: PACEMAKER IMPLANT;  Surgeon: Marinus Maw, MD;  Location: [facility] INVASIVE CV LAB;  Service: Cardiovascular;   Laterality: N/A; . TONSILLECTOMY AND ADENOIDECTOMY  1948 . TOTAL  SHOULDER REPLACEMENT  2010  right shoulder  . TUBAL LIGATION  1979 . TYMPANOSTOMY TUBE PLACEMENT Bilateral  HPI: Danielle Clarke is a 78 y.o. female with history of diabetes mellitus type 1, asthmatic bronchitis, neck fx 2013, hyperlipidemia was brought to the ER after syncopal episode at the dentist office. Had some difficulty talking. CT head showed left frontal intraparenchymal hematoma and subarachnoid and subdural hematoma in both convexities (small hemorrhagic contusions at the anterior temporal poles bilaterally). Pt had coffee-ground emesis, nause/vomiting which has resolved and GI has signed off. Reported history of syncope ascribed to swallowing and coughing.  Subjective: alert Assessment / Plan / Recommendation CHL IP CLINICAL IMPRESSIONS 02/11/2019 Clinical Impression Pt exhibits a mild-moderate oropharyngeal dysphagia marked by laryngeal penetration with thin and nectar. Orally, she demonstrated delays in transit and bolus cohesion and fluidity of transit to pharynx. Pt unable to transit solid bolus into hypopharynx without thin liquid to assist accompanied by episodes of gagging. Barium entered nasal cavity for several seconds x 1 before exiting and swallow initiated. Epiglottic inversion was incomplete without retroflexion and mistiming of laryngeal closure allowing thin and nectar thick barium to reach just above the vocal cords. She was sensate to majority of penetrates, clearing throat reflexively to inconsistently clear penetrates. Chin tuck was ineffective to eliminate or decrease penetration. Decreased epiglottic inversion and contraction led to mild vallecular and pyriform sinus residue. Given effort required for regular texture solids to transit pharynx recommend initiation of full liquid diet, no straws, small sips, multiple swallows, throat clears throughout meals and crush pills. ST will continue.      SLP Visit Diagnosis  Dysphagia, oropharyngeal phase (R13.12) Attention and concentration deficit following -- Frontal lobe and executive function deficit following -- Impact on safety and function Moderate aspiration risk   CHL IP TREATMENT RECOMMENDATION 02/11/2019 Treatment Recommendations Therapy as outlined in treatment plan below   Prognosis 02/11/2019 Prognosis for Safe Diet Advancement Good Barriers to Reach Goals -- Barriers/Prognosis Comment -- CHL IP DIET RECOMMENDATION 02/11/2019 SLP Diet Recommendations Thin liquid;Other (Comment) Liquid Administration via Cup;No straw Medication Administration Crushed with puree Compensations Small sips/bites;Slow rate;Multiple dry swallows after each bite/sip;Clear throat intermittently Postural Changes Seated upright at 90 degrees   CHL IP OTHER RECOMMENDATIONS 02/11/2019 Recommended Consults -- Oral Care Recommendations Oral care BID Other Recommendations --   CHL IP FOLLOW UP RECOMMENDATIONS 02/11/2019 Follow up Recommendations Home health SLP   CHL IP FREQUENCY AND DURATION 02/11/2019 Speech Therapy Frequency (ACUTE ONLY) min 2x/week Treatment Duration 2 weeks      CHL IP ORAL PHASE 02/11/2019 Oral Phase Impaired Oral - Pudding Teaspoon -- Oral - Pudding Cup -- Oral - Honey Teaspoon -- Oral - Honey Cup -- Oral - Nectar Teaspoon -- Oral - Nectar Cup Decreased bolus cohesion Oral - Nectar Straw -- Oral - Thin Teaspoon -- Oral - Thin Cup Decreased bolus cohesion Oral - Thin Straw Decreased bolus cohesion Oral - Puree Reduced posterior propulsion Oral - Mech Soft -- Oral - Regular Reduced posterior propulsion Oral - Multi-Consistency -- Oral - Pill -- Oral Phase - Comment --  CHL IP PHARYNGEAL PHASE 02/11/2019 Pharyngeal Phase Impaired Pharyngeal- Pudding Teaspoon -- Pharyngeal -- Pharyngeal- Pudding Cup -- Pharyngeal -- Pharyngeal- Honey Teaspoon -- Pharyngeal -- Pharyngeal- Honey Cup -- Pharyngeal -- Pharyngeal- Nectar Teaspoon -- Pharyngeal -- Pharyngeal- Nectar Cup Penetration/Aspiration  during swallow;Reduced epiglottic inversion;Pharyngeal residue - valleculae;Pharyngeal residue - pyriform Pharyngeal Material enters airway, remains ABOVE vocal cords and not ejected out Pharyngeal- Nectar Straw --  Pharyngeal -- Pharyngeal- Thin Teaspoon -- Pharyngeal -- Pharyngeal- Thin Cup Penetration/Aspiration during swallow;Pharyngeal residue - valleculae;Pharyngeal residue - pyriform;Reduced epiglottic inversion;Reduced airway/laryngeal closure Pharyngeal Material enters airway, remains ABOVE vocal cords and not ejected out Pharyngeal- Thin Straw Pharyngeal residue - pyriform;Pharyngeal residue - valleculae;Penetration/Aspiration during swallow;Reduced airway/laryngeal closure;Reduced epiglottic inversion Pharyngeal Material enters airway, remains ABOVE vocal cords and not ejected out Pharyngeal- Puree WFL Pharyngeal -- Pharyngeal- Mechanical Soft -- Pharyngeal -- Pharyngeal- Regular (No Data) Pharyngeal -- Pharyngeal- Multi-consistency -- Pharyngeal -- Pharyngeal- Pill -- Pharyngeal -- Pharyngeal Comment --  CHL IP CERVICAL ESOPHAGEAL PHASE 02/11/2019 Cervical Esophageal Phase WFL Pudding Teaspoon -- Pudding Cup -- Honey Teaspoon -- Honey Cup -- Nectar Teaspoon -- Nectar Cup -- Nectar Straw -- Thin Teaspoon -- Thin Cup -- Thin Straw -- Puree -- Mechanical Soft -- Regular -- Multi-consistency -- Pill -- Cervical Esophageal Comment -- Royce MacadamiaLitaker, Lisa Willis 02/11/2019, 11:25 AM  Breck CoonsLisa Willis Lonell FaceLitaker M.Ed Sports administratorCCC-SLP Speech-Language Pathologist Pager [phone] Office [phone]             Vas [place] Carotid  Result Date: 02/10/2019 Carotid Arterial Duplex Study Indications:       Syncope. Limitations:       constant movement Comparison Study:  Prior study from 01/05/17 is available for comparison. Performing Technologist: Sherren Kernsandace Kanady RVS  Examination Guidelines: A complete evaluation includes B-mode imaging, spectral Doppler, color Doppler, and power Doppler as needed of all accessible portions of each vessel. Bilateral  testing is considered an integral part of a complete examination. Limited examinations for reoccurring indications may be performed as noted.  Right Carotid Findings: +----------+--------+--------+--------+------------+------------------+           PSV cm/sEDV cm/sStenosisDescribe    Comments           +----------+--------+--------+--------+------------+------------------+ CCA Prox  107     14                          intimal thickening +----------+--------+--------+--------+------------+------------------+ CCA Distal95      16                          intimal thickening +----------+--------+--------+--------+------------+------------------+ ICA Prox  91      20              heterogenous                   +----------+--------+--------+--------+------------+------------------+ ICA Distal113     24                                             +----------+--------+--------+--------+------------+------------------+ ECA       173     9               calcific                       +----------+--------+--------+--------+------------+------------------+ +----------+--------+-------+--------+-------------------+           PSV cm/sEDV cmsDescribeArm Pressure (mmHG) +----------+--------+-------+--------+-------------------+ [id number]                                        +----------+--------+-------+--------+-------------------+ +---------+--------+--------+----------------------------------+ VertebralPSV cm/sEDV cm/sNot assessed and constant movement +---------+--------+--------+----------------------------------+  Left Carotid Findings: +----------+--------+--------+--------+-----------+------------------+  PSV cm/sEDV cm/sStenosisDescribe   Comments           +----------+--------+--------+--------+-----------+------------------+ CCA Prox  96      15                         intimal thickening  +----------+--------+--------+--------+-----------+------------------+ CCA Distal84      17                         intimal thickening +----------+--------+--------+--------+-----------+------------------+ ICA Prox  78      19              homogeneous                   +----------+--------+--------+--------+-----------+------------------+ ICA Distal90      20                                            +----------+--------+--------+--------+-----------+------------------+ ECA       296     9               calcific                      +----------+--------+--------+--------+-----------+------------------+ +----------+--------+--------+--------+-------------------+ SubclavianPSV cm/sEDV cm/sDescribeArm Pressure (mmHG) +----------+--------+--------+--------+-------------------+           138                                         +----------+--------+--------+--------+-------------------+ +---------+--------+---+--------+--+ VertebralPSV cm/s135EDV cm/s22 +---------+--------+---+--------+--+  Summary: Right Carotid: Velocities in the right ICA are consistent with a 1-39% stenosis. Left Carotid: Velocities in the left ICA are consistent with a 1-39% stenosis. Vertebrals:  Left vertebral artery demonstrates antegrade flow. Subclavians: Normal flow hemodynamics were seen in bilateral subclavian              arteries. *See table(s) above for measurements and observations.  Electronically signed by Gretta Began MD on 02/10/2019 at 7:42:06 AM.    Final    Vas [place] Lower Extremity Venous (dvt)  Result Date: 02/17/2019  Lower Venous Study Indications: Elevated d dimer.  Limitations: Patient pain tolerance. Performing Technologist: Blanch Media RVS  Examination Guidelines: A complete evaluation includes B-mode imaging, spectral Doppler, color Doppler, and power Doppler as needed of all accessible portions of each vessel. Bilateral testing is considered an integral part of a complete  examination. Limited examinations for reoccurring indications may be performed as noted.  Right Venous Findings: +---------+---------------+---------+-----------+----------+-------------------+          CompressibilityPhasicitySpontaneityPropertiesSummary             +---------+---------------+---------+-----------+----------+-------------------+ CFV      Full           Yes      Yes                                      +---------+---------------+---------+-----------+----------+-------------------+ SFJ      Full                                                             +---------+---------------+---------+-----------+----------+-------------------+  FV Prox  Full                                                             +---------+---------------+---------+-----------+----------+-------------------+ FV Mid   Full                                                             +---------+---------------+---------+-----------+----------+-------------------+ FV Distal               Yes      Yes                  unable to compress                                                        due to patient pain                                                       tolerance           +---------+---------------+---------+-----------+----------+-------------------+ PFV      Full                                                             +---------+---------------+---------+-----------+----------+-------------------+ POP      Full           Yes      Yes                                      +---------+---------------+---------+-----------+----------+-------------------+ PTV      Full                                                             +---------+---------------+---------+-----------+----------+-------------------+ PERO     Full                                                              +---------+---------------+---------+-----------+----------+-------------------+  Left Venous Findings: +---------+---------------+---------+-----------+----------+-------+          CompressibilityPhasicitySpontaneityPropertiesSummary +---------+---------------+---------+-----------+----------+-------+ CFV      Full           Yes  Yes                          +---------+---------------+---------+-----------+----------+-------+ SFJ      Full                                                 +---------+---------------+---------+-----------+----------+-------+ FV Prox  Full                                                 +---------+---------------+---------+-----------+----------+-------+ FV Mid   Full                                                 +---------+---------------+---------+-----------+----------+-------+ FV DistalFull                                                 +---------+---------------+---------+-----------+----------+-------+ PFV      Full                                                 +---------+---------------+---------+-----------+----------+-------+ POP      Full           Yes      Yes                          +---------+---------------+---------+-----------+----------+-------+ PTV      Full                                                 +---------+---------------+---------+-----------+----------+-------+ PERO     Full                                                 +---------+---------------+---------+-----------+----------+-------+    Summary: Right: There is no evidence of deep vein thrombosis in the lower extremity. No cystic structure found in the popliteal fossa. Left: There is no evidence of deep vein thrombosis in the lower extremity. No cystic structure found in the popliteal fossa.  *See table(s) above for measurements and observations. Electronically signed by Coral Else MD on 02/17/2019 at 8:39:03 AM.     Final     Labs:  Basic Metabolic Panel: Recent Labs  Lab 02/22/19 1217 02/22/19 1707 02/25/19 0612  NA  --  136 136  K  --  3.3* 4.4  CL  --  98 99  CO2  --  26 25  GLUCOSE 651* 195* 175*  BUN  --  30* 15  CREATININE  --  0.88 0.71  CALCIUM  --  9.0  8.9    CBC: Recent Labs  Lab 02/24/19 0803  WBC 7.6  NEUTROABS 5.9  HGB 10.8*  HCT 32.5*  MCV 89.0  PLT 405*    CBG: Recent Labs  Lab 02/26/19 0454 02/26/19 0824 02/26/19 1159 02/26/19 1735 02/26/19 2051  GLUCAP 111* 388* 227* 116* 268*   Family history Mother with cancer as well as hypertension. Father with CAD. Denies any diabetes  Brief HPI:    Danielle Clarke is a 78 year old right-handed female with history of diabetes mellitus maintained on Tresiba 10 mg daily, hyperlipidemia, hypertension, mild aortic stenosis, carotid artery disease maintained on aspirin 81 mg daily, anterior cervical corpectomy, recurrent syncopal episodes followed by cardiology services. Per chart review patient lives alone. One level home. She does use a cane when needed. Family in the area work. Presented 02/07/2019 after syncopal episode while at the dental office. Daughter reports she struck her head no seizure activity. Blood sugar greater than 200. Cranial CT scan MRI showed left frontal intraparenchymal hematoma with volume approximately 10 mL. Posterior right parietal scalp hematoma consistent with contrecoup pattern of injury. No acute fracture. No underlying mass lesion. There was a 4 mm left-to-right midline shift no hydrocephalus. Neurosurgery consulted and advised conservative care. Noted blood pressure elevated placed on nifedipine drip as well as heart rate 100s. Echocardiogram with ejection fraction of 65% normal systolic function. EP follow-up for multiple episodes of intermittent high-grade heart block identified on telemetry followed by cardiology services in the past with troponin negative. Patient later underwent permanent  pacemaker placement 02/10/2019 per Dr. Gilman Schmidt. 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 No current plan for endoscopy at this time. Hemoglobin remained stable. Maintained on a full liquid diet. Close monitoring of blood pressure. Therapy evaluations completed and patient was admitted for a compress of rehabilitation program  Hospital Course: Danielle Clarke was admitted to rehab 02/14/2019 for inpatient therapies to consist of PT, ST and OT at least three hours five days a week. Past admission physiatrist, therapy team and rehab RN have worked together to provide customized collaborative inpatient rehab. Pertaining to the patient's traumatic left frontal intraparenchymal hematoma. Conservative care follow-up per neurosurgery. SCDs for DVT prophylaxis. Pain management with the use of a Lidoderm patch. Followed by cardiology services for findings of high-grade heart block recurrent syncope patient underwent pacemaker insertion 02/10/2019 no chest pain or shortness of breath cardiac rate controlled she remained on low-dose beta blocker as well as lisinopril. Her diet had been advanced to mechanical soft tolerating nicely. Initially in need of IV fluids for hydration which it since been discontinued. Intermittent bouts of coffee-ground emesis while on acute care services continued with PPI no further bouts reported hemoglobin stable at 10.8. Blood sugars labile and she continued on Lantus insulin as well as Glucophage and would follow up with her primary M.D.  Physical exam. Blood pressure 138/73 pulse 59 temperature 90.8 respirations 18 oxygen saturation 98% room air Constitutional. Patient appeared well-developed stress HEENT Head normocephalic Eyes. Pupils round and reactive to light no discharge without nystagmus Neck. Supple normal range of motion no  tracheal deviation no thyromegaly negative JVD Cardiac. Rate controlled no friction rub or murmur heard Respiratory. Effort normal no respiratory distress without wheeze GI. Soft no distention no abdominal tenderness without rebound Musculoskeletal. Normal range of motion no deformity or edema Neurological. Patient alert oriented to [facility] no  she failed couldn't remember anything about the incident. Fair insight and awareness. Slow to process. CN without obvious abnormality. Trakstar fields no diplopia. Upper extremities 4 out of 5 proximal to distal lower extremities 4-4 out of 5 proximal to distal sensation intact  Rehab course: During patient's stay in rehab weekly team conferences were held to monitor patient's progress, set goals and discuss barriers to discharge. At admission, patient required moderate assistance 45 feet rolling walker, moderate assist sit to stand, minimal assist supine to sit and rolling. Moderate assist upper body bathing, moderate assist upper body dressing, max assist lower body bathing and max assist lower body dressing. Max assist for toileting clothing manipulation and hygiene.  She  has had improvement in activity tolerance, balance, postural control as well as ability to compensate for deficits. He/She has had improvement in functional use RUE/LUE  and RLE/LLE as well as improvement in awareness. Patient with significant gains during her rehabilitation stay. Sit to stand from recliner with minimal cysts and occasional cues. Ambulates to the sink with supervision using rolling walker to stand. Needed some encouragement at times to participate. She could ambulate up to 150 feet 2. Minimal cyst at times for standing balance. Performed clothing management and hygiene with supervision. Contact-guard assist for toilet transfers. She would ambulate to the bathroom for her toileting needs. Most dressing completed seated in recliner. Her diet had been advanced to a  mechanical soft followed by speech therapy tolerating well. Full family teaching was completed and plan discharge to home.       Disposition:  discharged to home   Diet: mechanical soft thin liquids with diabetic restrictions  Special Instructions:  supervision for safety  Medications at discharge. 1. Tylenol as needed 2. Lantus insulin 12 units daily 3. Lidoderm patch daily as directed 4. Lisinopril 2.5 mg by mouth daily at bedtime 5. Ativan 0.5 mg twice a day as needed anxiety 6. Glucophage 500 mg by mouth daily 7. Lopressor 25 mg by mouth twice a day 8. Protonix 40 mg by mouth twice a day 9. Zocor 40 mg daily  Discharge Instructions    Ambulatory referral to Physical Medicine Rehab   Complete by:  As directed    Moderate complexity follow-up 1-2 weeks traumatic intraparenchymal hemorrhage      Follow-up Information    Marcello Fennel, MD Follow up.   Specialty:  Physical Medicine and Rehabilitation Why:  office to call for appointment Contact information: [address] [place] [address] [phone]        Lisbeth Renshaw, MD Follow up.   Specialty:  Neurosurgery Why:  call for appointment Contact information: [address] [place] [address] [phone]        [place], Peter M, MD Follow up.   Specialty:  Cardiology Why:  call for appointment Contact information: [address] [place] [address] [phone]        Vida Rigger, MD Follow up.   Specialty:  Gastroenterology Why:  call for appointment Contact information: [address] [place] [address] [phone]           Signed: Charlton Amor 02/27/2019, 5:42 AM  Patient seen and examined by me on day of discharge. Maryla Morrow, MD, ABPMR

## 2019-02-28 LAB — GLUCOSE, CAPILLARY: Glucose-Capillary: 270 mg/dL — ABNORMAL HIGH (ref 70–99)

## 2019-02-28 MED ORDER — METOPROLOL TARTRATE 25 MG PO TABS: 25.0000 mg | ORAL_TABLET | Freq: Two times a day (BID) | ORAL | 0 refills | Status: DC

## 2019-02-28 MED ORDER — PANTOPRAZOLE SODIUM 40 MG PO PACK: 40.0000 mg | PACK | Freq: Two times a day (BID) | ORAL | 1 refills | Status: DC

## 2019-02-28 MED ORDER — LISINOPRIL 2.5 MG PO TABS: 2.5000 mg | ORAL_TABLET | Freq: Every day | ORAL | 2 refills | Status: DC

## 2019-02-28 MED ORDER — LIDOCAINE 5 % EX PTCH: 1.0000 | MEDICATED_PATCH | CUTANEOUS | 0 refills | Status: DC

## 2019-02-28 MED ORDER — LORAZEPAM 0.5 MG PO TABS: 0.5000 mg | ORAL_TABLET | Freq: Two times a day (BID) | ORAL | 0 refills | Status: DC | PRN

## 2019-02-28 MED ORDER — INSULIN GLARGINE 100 UNIT/ML SOLOSTAR PEN: 13.0000 [IU] | PEN_INJECTOR | Freq: Every day | SUBCUTANEOUS | 11 refills | Status: DC

## 2019-02-28 MED ORDER — METFORMIN HCL 500 MG PO TABS: 500.0000 mg | ORAL_TABLET | Freq: Every day | ORAL | 1 refills | Status: DC

## 2019-02-28 MED ORDER — ACETAMINOPHEN 325 MG PO TABS: 650.0000 mg | ORAL_TABLET | Freq: Four times a day (QID) | ORAL | Status: DC | PRN

## 2019-02-28 NOTE — Progress Notes (Signed)
Equipment has been delivered.  

## 2019-02-28 NOTE — Discharge Instructions (Signed)
Inpatient Rehab Discharge Instructions  MAGDALENE PETZOLDT Discharge date and time: No discharge date for patient encounter.   Activities/Precautions/ Functional Status: Activity: activity as tolerated Diet: soft Wound Care: none needed Functional status:  ___ No restrictions     ___ Walk up steps independently ___ 24/7 supervision/assistance   ___ Walk up steps with assistance ___ Intermittent supervision/assistance  ___ Bathe/dress independently ___ Walk with walker     _x__ Bathe/dress with assistance ___ Walk Independently    ___ Shower independently ___ Walk with assistance    ___ Shower with assistance ___ No alcohol     ___ Return to work/school ________           COMMUNITY REFERRALS UPON DISCHARGE:    Home Health:   PT     OT    ST    RN                        Agency:  [facility] Phone: [phone]   Medical Equipment/Items Ordered: transport wheelchair, walker, commode and tub bench                                                     Agency/Supplier:  [facility] @ [phone]   Special Instructions:    My questions have been answered and I understand these instructions. I will adhere to these goals and the provided educational materials after my discharge from the hospital.  Patient/Caregiver Signature _______________________________ Date __________  Clinician Signature _______________________________________ Date __________  Please bring this form and your medication list with you to all your follow-up doctor's appointments.

## 2019-02-28 NOTE — Progress Notes (Signed)
Notified PA of B/P and holding b/p med. No new orders, Informed NA to pull IV, gv pt fluids and encouraged to drink. Awaiting d/c notification.

## 2019-02-28 NOTE — Plan of Care (Signed)
Pt being dj/c home, education completed

## 2019-03-02 ENCOUNTER — Telehealth: Payer: Self-pay | Admitting: Interventional Cardiology

## 2019-03-02 NOTE — Telephone Encounter (Signed)
This years visit can be video virtual now or in the next 3-6 months Will need echo this time next year for aortic stenosis f/u.

## 2019-03-03 NOTE — Telephone Encounter (Signed)
Spoke with daughter, DPR on file.  She states pt recently hospitalized with other issues.  Had pacemaker placed.  She states pt need appt pushed out 3 months as she doesn't feel virtual visit is necessary at this time.  Rescheduled pt for 06/06/2019.  Advised daughter to call sooner if any issues.

## 2019-03-03 NOTE — Progress Notes (Signed)
Social Work  Discharge Note  The overall goal for the admission was met for:   Discharge location: Yes - pt returning to her home with daughter, [name], to stay and provide 24/7 assistance.  Length of Stay: Yes - 14 days  Discharge activity level: Yes - CGA overall  Home/community participation: Yes  Services provided included: MD, RD, PT, OT, SLP, RN, TR, Pharmacy, Neuropsych and SW  Financial Services: [employer] Medicare  Follow-up services arranged: Home Health: RN, PT, OT, ST via [facility], DME: transport w/c, walker, 3n1 commode via [facility] and Patient/Family has no preference for HH/DME agencies  Comments (or additional information):  Contact info:  Daughter, [name] Dwiggins @ [phone]  Patient/Family verbalized understanding of follow-up arrangements: Yes  Individual responsible for coordination of the follow-up plan: pt/daughter  Confirmed correct DME delivered: HOYLE, LUCY 03/03/2019    HOYLE, LUCY 

## 2019-03-10 ENCOUNTER — Telehealth: Payer: Self-pay | Admitting: Registered Nurse

## 2019-03-10 ENCOUNTER — Ambulatory Visit: Payer: Medicare Other | Admitting: Interventional Cardiology

## 2019-03-10 NOTE — Telephone Encounter (Signed)
Transitional Care call Transitional Questions Answered by Daughter  Danielle Clarke)    Patient name: Danielle Clarke  DOB: Mar 16, 1941 1. Are you/is patient experiencing any problems since coming home? She was experiencing Hypotension, her PCP Dr. Clarene Duke discontinued anti-hypertensive medications.  a. Are there any questions regarding any aspect of care? No 2. Are there any questions regarding medications administration/dosing? No a. Are meds being taken as prescribed? All except antihypertensive medication, see the above note.  b. "Patient should review meds with caller to confirm" Medication List Reviewed 3. Have there been any falls? No 4. Has Home Health been to the house and/or have they contacted you? Yes, [facility] a. If not, have you tried to contact them? NA b. Can we help you contact them? NA 5. Are bowels and bladder emptying properly? Yes a. Are there any unexpected incontinence issues? No b. If applicable, is patient following bowel/bladder programs? NA 6. Any fevers, problems with breathing, unexpected pain? No 7. Are there any skin problems or new areas of breakdown? No 8. Has the patient/family member arranged specialty MD follow up (ie cardiology/neurology/renal/surgical/etc.)?  No, reviewed her HFU appointmentnts schedule, Ms. [name] will call to schedule the HFU appointments she reports.  a. Can we help arrange? No 9. Does the patient need any other services or support that we can help arrange? No 10. Are caregivers following through as expected in assisting the patient? Yes 11. Has the patient quit smoking, drinking alcohol, or using drugs as recommended? [name] ( daughter) states her mother [name] doesn't smoke, drink alcohol or use illicit drugs.   Appointment date/time 04/16/2019,  arrival time 9:45 for 10:00 appointment with Dr. Allena Katz. At [address] [facility] [address]

## 2019-03-21 ENCOUNTER — Other Ambulatory Visit: Payer: Self-pay | Admitting: Physician Assistant

## 2019-03-21 DIAGNOSIS — S065X9A Traumatic subdural hemorrhage with loss of consciousness of unspecified duration, initial encounter: Secondary | ICD-10-CM

## 2019-03-21 DIAGNOSIS — S065XAA Traumatic subdural hemorrhage with loss of consciousness status unknown, initial encounter: Secondary | ICD-10-CM

## 2019-03-27 ENCOUNTER — Ambulatory Visit
Admission: RE | Admit: 2019-03-27 | Discharge: 2019-03-27 | Disposition: A | Discharge status: Home / Self Care | Payer: Medicare Other | Source: Ambulatory Visit | Attending: Physician Assistant | Admitting: Physician Assistant

## 2019-03-27 ENCOUNTER — Other Ambulatory Visit: Payer: Self-pay

## 2019-03-27 DIAGNOSIS — S065XAA Traumatic subdural hemorrhage with loss of consciousness status unknown, initial encounter: Secondary | ICD-10-CM

## 2019-03-27 DIAGNOSIS — S065X9A Traumatic subdural hemorrhage with loss of consciousness of unspecified duration, initial encounter: Secondary | ICD-10-CM

## 2019-04-16 ENCOUNTER — Encounter: Payer: Self-pay | Admitting: Physical Medicine & Rehabilitation

## 2019-04-16 ENCOUNTER — Encounter: Payer: Medicare Other | Attending: Physical Medicine & Rehabilitation | Admitting: Physical Medicine & Rehabilitation

## 2019-04-16 ENCOUNTER — Other Ambulatory Visit: Payer: Self-pay

## 2019-04-16 VITALS — BP 154/68 | HR 82 | Temp 98.2°F | Ht 60.0 in | Wt 108.2 lb

## 2019-04-16 DIAGNOSIS — R7309 Other abnormal glucose: Secondary | ICD-10-CM | POA: Diagnosis present

## 2019-04-16 DIAGNOSIS — N181 Chronic kidney disease, stage 1: Secondary | ICD-10-CM | POA: Diagnosis present

## 2019-04-16 DIAGNOSIS — E1022 Type 1 diabetes mellitus with diabetic chronic kidney disease: Secondary | ICD-10-CM | POA: Diagnosis present

## 2019-04-16 DIAGNOSIS — S069X9S Unspecified intracranial injury with loss of consciousness of unspecified duration, sequela: Secondary | ICD-10-CM | POA: Diagnosis present

## 2019-04-16 DIAGNOSIS — Z950 Presence of cardiac pacemaker: Secondary | ICD-10-CM | POA: Insufficient documentation

## 2019-04-16 DIAGNOSIS — R269 Unspecified abnormalities of gait and mobility: Secondary | ICD-10-CM | POA: Insufficient documentation

## 2019-04-16 DIAGNOSIS — I10 Essential (primary) hypertension: Secondary | ICD-10-CM | POA: Insufficient documentation

## 2019-04-16 DIAGNOSIS — S06340S Traumatic hemorrhage of right cerebrum without loss of consciousness, sequela: Secondary | ICD-10-CM | POA: Diagnosis present

## 2019-04-16 DIAGNOSIS — S06310S Contusion and laceration of right cerebrum without loss of consciousness, sequela: Secondary | ICD-10-CM

## 2019-04-16 NOTE — Progress Notes (Signed)
Subjective:    Patient ID: Danielle Clarke, female    DOB: [birth year], 78 y.o.   MRN: [id number]  HPI 78 year old right-handed female with history of diabetes mellitus, hyperlipidemia, hypertension, mild aortic stenosis, carotid artery disease, anterior cervical corpectomy, recurrent syncopal episodes presents for hospital follow-up after receiving CIR for traumatic left frontal intraparenchymal hematoma.  Daughter provides history. At discharge, she was instructed to follow up with Neurosurg, which she did.  She sees Cards next month. She has not made an appointment with GI. She has supervision at home.  Denies falls. She notes improvement in cognition.  BP is elevated today, being followed up PCP due to lability.  CBGs remains elevated, being followed up Endo. Denies issues with swallowing.   Therapies: 2/week DME: Bedside commode, Shower chair Mobility: Walker when alone, cane with support of another person  Pain Inventory Average Pain 5 Pain Right Now 4 My pain is sharp  In the last 24 hours, has pain interfered with the following? General activity 3 Relation with others 3 Enjoyment of life 3 What TIME of day is your pain at its worst? evening Sleep (in general) Good  Pain is worse with: some activites Pain improves with: therapy/exercise Relief from Meds: n/a  Mobility use a cane use a walker ability to climb steps?  yes do you drive?  no  Function retired I need assistance with the following:  meal prep, household duties and shopping  Neuro/Psych trouble walking  Prior Studies Any changes since last visit?  no  Physicians involved in your care Any changes since last visit?  no Verdis Prime Cardiologist/ Sharrell Ku pacemaker   Family History  Problem Relation Age of Onset  . Cancer Mother   . Heart Problems Father    Social History   Socioeconomic History  . Marital status: Divorced    Spouse name: Not on file  . Number of children: 2  . Years of  education: [employer]  . Highest education level: Not on file  Occupational History  . Occupation: Retired    Associate Professor: RETIRED  Social Needs  . Financial resource strain: Not on file  . Food insecurity:    Worry: Not on file    Inability: Not on file  . Transportation needs:    Medical: Not on file    Non-medical: Not on file  Tobacco Use  . Smoking status: Never Smoker  . Smokeless tobacco: Never Used  Substance and Sexual Activity  . Alcohol use: Yes    Alcohol/week: 2.0 standard drinks    Types: 2 Glasses of wine per week  . Drug use: Never  . Sexual activity: Never  Lifestyle  . Physical activity:    Days per week: Not on file    Minutes per session: Not on file  . Stress: Not on file  Relationships  . Social connections:    Talks on phone: Not on file    Gets together: Not on file    Attends religious service: Not on file    Active member of club or organization: Not on file    Attends meetings of clubs or organizations: Not on file    Relationship status: Not on file  Other Topics Concern  . Not on file  Social History Narrative   Patient lives at home alone.   Caffeine Use:    Past Surgical History:  Procedure Laterality Date  . ANKLE FRACTURE SURGERY Left 2001   steel plate and 3 screws   .  ANTERIOR CERVICAL CORPECTOMY N/A 07/31/2014   Procedure: Cervical Four to Cervical Six Corpectomy;  Surgeon: Karn Cassis, MD;  Location: [facility]O ORS;  Service: Neurosurgery;  Laterality: N/A;  C4 to C6 Corpectomy  . BREAST SURGERY  1988  . CATARACT EXTRACTION W/ INTRAOCULAR LENS  IMPLANT, BILATERAL Bilateral 2011   right and then left  . EYE SURGERY    . FRACTURE SURGERY    . HAMMER TOE SURGERY  1998  . I&D EXTREMITY Right 09/27/2018   Procedure: IRRIGATION AND DEBRIDEMENT RIGHT FOOT;  Surgeon: Nadara Mustard, MD;  Location: [facility];  Service: Orthopedics;  Laterality: Right;  . JOINT REPLACEMENT    . PACEMAKER IMPLANT N/A 02/10/2019   Procedure: PACEMAKER IMPLANT;   Surgeon: Marinus Maw, MD;  Location[facility] INVASIVE CV LAB;  Service: Cardiovascular;  Laterality: N/A;  . TONSILLECTOMY AND ADENOIDECTOMY  1948  . TOTAL SHOULDER REPLACEMENT  2010   right shoulder   . TUBAL LIGATION  1979  . TYMPANOSTOMY TUBE PLACEMENT Bilateral    Past Medical History:  Diagnosis Date  . Aortic stenosis, mild   . Arthritis   . Asthmatic bronchitis    with colds per patient  . Carotid artery disease (HCC)    40-59% bilateral ICA stenosis  . Cataract   . Cutaneous abscess of right foot   . Glaucoma   . Heart murmur    Dr Katrinka Blazing is her  cardiologist.   . Hyperlipemia   . Hypertension    Dr. Katrinka Blazing ~ 2 years ago  . Neck fracture [facility])    july 2013  . Osteoporosis   . Syncope   . Type 1 diabetes mellitus (HCC)    BP (!) 154/68   Pulse 82   Temp 98.2 F (36.8 C)   Ht 5' (1.524 m)   Wt 108 lb 3.2 oz (49.1 kg)   SpO2 97%   BMI 21.13 kg/m   Opioid Risk Score:   Fall Risk Score:  `1  Depression screen PHQ 2/9  Depression screen [facility] 2/9 04/16/2019 05/08/2015  Decreased Interest 0 0  Down, Depressed, Hopeless 0 0  PHQ - 2 Score 0 0  Altered sleeping 0 -  Tired, decreased energy 0 -  Change in appetite 0 -  Feeling bad or failure about yourself  0 -  Trouble concentrating 0 -  Moving slowly or fidgety/restless 0 -  Suicidal thoughts 0 -  PHQ-9 Score 0 -  Difficult doing work/chores Not difficult at all -   Review of Systems  Constitutional: Negative.   HENT: Negative.   Eyes: Negative.   Respiratory: Negative.   Cardiovascular: Negative.   Gastrointestinal: Negative.   Endocrine:       Fluctuating blood sugars  Genitourinary: Negative.   Musculoskeletal: Positive for gait problem.  Skin: Positive for wound.       Steri strips have not loosened at all since pacemaker placed-- family questions because told they would come off on their own  Allergic/Immunologic: Negative.   Neurological: Positive for weakness.  Hematological: Negative.    Psychiatric/Behavioral: Negative.   All other systems reviewed and are negative.     Objective:   Physical Exam Constitutional: No distress . Vital signs reviewed. HENT: Normocephalic.  Atraumatic. Eyes: EOMI. No discharge. Cardiovascular: No JVD.   Respiratory: Normal effort. GI: Non-distended. Musc: No edema or tenderness in extremities. Neurological: Alert and oriented x3. Motor: Grossly 4+/5 throughout, except for left ankle (fusion) Skin: Skin iswarmand dry.  Psychiatric:  Normal mood.  Normal affect.    Assessment & Plan:  78 year old right-handed female with history of diabetes mellitus, hyperlipidemia, hypertension, mild aortic stenosis, carotid artery disease, anterior cervical corpectomy, recurrent syncopal episodes presents for hospital follow-up after receiving CIR for traumatic left frontal intraparenchymal hematoma.  1.  Decreased functional mobility secondary to traumatic left frontal intraparenchymal hematoma             Continue therapies  Follow up with Neurosurg  2. Dysphagia:              Appears to have resolved  2. Intermittent coffee-ground emesis.   Follow up with GI - needs appointment   3. Diabetes mellitus  Cont meds  Labile - following up with Endo  4.  Essential hypertension  Cont meds  Elevated today  Being adjusted by PCP  5.  Gait abnormality  Cont walker for safety  Cont therapies  Meds reviewed Referrals reviewed - Needs GI appointment All questions answered

## 2019-05-09 ENCOUNTER — Telehealth: Payer: Self-pay

## 2019-05-09 NOTE — Telephone Encounter (Signed)
YOUR CARDIOLOGY TEAM HAS ARRANGED FOR AN E-VISIT FOR YOUR APPOINTMENT - PLEASE REVIEW IMPORTANT INFORMATION BELOW SEVERAL DAYS PRIOR TO YOUR APPOINTMENT ° °Due to the recent COVID-19 pandemic, we are transitioning in-person office visits to tele-medicine visits in an effort to decrease unnecessary exposure to our patients, their families, and staff. These visits are billed to your insurance just like a normal visit is. We also encourage you to sign up for MyChart if you have not already done so. You will need a smartphone if possible. For patients that do not have this, we can still complete the visit using a regular telephone but do prefer a smartphone to enable video when possible. You may have a family member that lives with you that can help. If possible, we also ask that you have a blood pressure cuff and scale at home to measure your blood pressure, heart rate and weight prior to your scheduled appointment. Patients with clinical needs that need an in-person evaluation and testing will still be able to come to the office if absolutely necessary. If you have any questions, feel free to call our office. ° ° ° ° °YOUR PROVIDER WILL BE USING THE FOLLOWING PLATFORM TO COMPLETE YOUR VISIT: Phone Call ° °• IF USING MYCHART - How to Download the MyChart App to Your SmartPhone  ° °- If Apple, go to App Store and type in MyChart in the search bar and download the app. If Android, ask patient to go to Google Play Store and type in MyChart in the search bar and download the app. The app is free but as with any other app downloads, your phone may require you to verify saved payment information or Apple/Android password.  °- You will need to then log into the app with your MyChart username and password, and select [facility] as your healthcare provider to link the account.  °- When it is time for your visit, go to the MyChart app, find appointments, and click Begin Video Visit. Be sure to Select Allow for your device to  access the Microphone and Camera for your visit. You will then be connected, and your provider will be with you shortly. ° **If you have any issues connecting or need assistance, please contact MyChart service desk [phone] ([phone])** ° **If using a computer, in order to ensure the best quality for your visit, you will need to use either of the following Internet Browsers: Google Chrome or Microsoft Edge** ° °• IF USING DOXIMITY or [email or web address].ME - The staff will give you instructions on receiving your link to join the meeting the day of your visit.  ° ° ° ° °2-3 DAYS BEFORE YOUR APPOINTMENT ° °You will receive a telephone call from one of our [facility] team members - your caller ID may say "Unknown caller." If this is a video visit, we will walk you through how to get the video launched on your phone. We will remind you check your blood pressure, heart rate and weight prior to your scheduled appointment. If you have an Apple Watch or Kardia, please upload any pertinent ECG strips the day before or morning of your appointment to MyChart. Our staff will also make sure you have reviewed the consent and agree to move forward with your scheduled tele-health visit.  ° ° ° °THE DAY OF YOUR APPOINTMENT ° °Approximately 15 minutes prior to your scheduled appointment, you will receive a telephone call from one of Heart[facility] team - your caller ID may say "Unknown caller."    Our staff will confirm medications, vital signs for the day and any symptoms you may be experiencing. Please have this information available prior to the time of visit start. It may also be helpful for you to have a pad of paper and pen handy for any instructions given during your visit. They will also walk you through joining the smartphone meeting if this is a video visit. ° ° ° °CONSENT FOR TELE-HEALTH VISIT - PLEASE REVIEW ° °I hereby voluntarily request, consent and authorize [facility] and its employed or contracted physicians, physician  assistants, nurse practitioners or other licensed health care professionals (the Practitioner), to provide me with telemedicine health care services (the “Services") as deemed necessary by the treating Practitioner. I acknowledge and consent to receive the Services by the Practitioner via telemedicine. I understand that the telemedicine visit will involve communicating with the Practitioner through live audiovisual communication technology and the disclosure of certain medical information by electronic transmission. I acknowledge that I have been given the opportunity to request an in-person assessment or other available alternative prior to the telemedicine visit and am voluntarily participating in the telemedicine visit. ° °I understand that I have the right to withhold or withdraw my consent to the use of telemedicine in the course of my care at any time, without affecting my right to future care or treatment, and that the Practitioner or I may terminate the telemedicine visit at any time. I understand that I have the right to inspect all information obtained and/or recorded in the course of the telemedicine visit and may receive copies of available information for a reasonable fee.  I understand that some of the potential risks of receiving the Services via telemedicine include:  °• Delay or interruption in medical evaluation due to technological equipment failure or disruption; °• Information transmitted may not be sufficient (e.g. poor resolution of images) to allow for appropriate medical decision making by the Practitioner; and/or  °• In rare instances, security protocols could fail, causing a breach of personal health information. ° °Furthermore, I acknowledge that it is my responsibility to provide information about my medical history, conditions and care that is complete and accurate to the best of my ability. I acknowledge that Practitioner's advice, recommendations, and/or decision may be based on  factors not within their control, such as incomplete or inaccurate data provided by me or distortions of diagnostic images or specimens that may result from electronic transmissions. I understand that the practice of medicine is not an exact science and that Practitioner makes no warranties or guarantees regarding treatment outcomes. I acknowledge that I will receive a copy of this consent concurrently upon execution via email to the email address I last provided but may also request a printed copy by calling the office of [facility].   ° °I understand that my insurance will be billed for this visit.  ° °I have read or had this consent read to me. °• I understand the contents of this consent, which adequately explains the benefits and risks of the Services being provided via telemedicine.  °• I have been provided ample opportunity to ask questions regarding this consent and the Services and have had my questions answered to my satisfaction. °• I give my informed consent for the services to be provided through the use of telemedicine in my medical care ° °By participating in this telemedicine visit I agree to the above. °

## 2019-05-13 ENCOUNTER — Telehealth: Payer: Self-pay

## 2019-05-13 NOTE — Telephone Encounter (Signed)

## 2019-05-19 ENCOUNTER — Telehealth: Payer: Self-pay | Admitting: Interventional Cardiology

## 2019-05-19 ENCOUNTER — Telehealth: Payer: Medicare Other | Admitting: Interventional Cardiology

## 2019-05-19 NOTE — Telephone Encounter (Signed)
Left message to call back.  Needs to be screened for COVID prior to appt tomorrow.

## 2019-05-19 NOTE — Progress Notes (Signed)
Cardiology Office Note:    Date:  05/20/2019   ID:  Danielle FordyceJacqueline J Clarke, DOB [birth year], MRN [id number]  PCP:  Danielle Clarke, Kevin, MD  Cardiologist:  Lesleigh NoeHenry W Smith III, MD   Referring MD: Juluis RainierBarnes, [name], MD   Chief Complaint  Patient presents with  . Cardiac Valve Problem  . Pacemaker Problem    History of Present Illness:    Danielle Clarke is a 78 y.o. female with a hx of aortic stenosis, recurring episodes of syncope, DM I,  bilateral carotid disease, and essential hypertension.. 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  In March the patient had a terrible fall and required pacemaker implantation.  She has done well since that time without recurrent syncope.  This was done by Dr. Lewayne BuntingGregg Taylor.  No syncope, angina, or heart failure symptoms.  Echocardiography was performed in March and she had an most moderate aortic stenosis.  Since discharge from the hospital she has done well.  Past Medical History:  Diagnosis Date  . Aortic stenosis, mild   . Arthritis   . Asthmatic bronchitis    with colds per patient  . Carotid artery disease (HCC)    40-59% bilateral ICA stenosis  . Cataract   . Cutaneous abscess of right foot   . Glaucoma   . Heart murmur    Dr [name] is her  cardiologist.   . Hyperlipemia   . Hypertension    Dr. [name] ~ 2 years ago  . Neck fracture [facility](HCC)    july 2013  . Osteoporosis   . Syncope   . Type 1 diabetes mellitus (HCC)     Past Surgical History:  Procedure Laterality Date  . ANKLE FRACTURE SURGERY Left 2001   steel plate and 3 screws   . ANTERIOR CERVICAL CORPECTOMY N/A 07/31/2014   Procedure: Cervical Four to Cervical Six Corpectomy;  Surgeon: Karn CassisErnesto M Botero, MD;  Location: [facility] OR[facility];  Service: Neurosurgery;  Laterality: N/A;  C4 to C6 Corpectomy  . BREAST SURGERY  1988  . CATARACT EXTRACTION W/ INTRAOCULAR LENS  IMPLANT, BILATERAL Bilateral 2011   right and then left  . EYE SURGERY    . FRACTURE SURGERY    . HAMMER TOE SURGERY   1998  . I&D EXTREMITY Right 09/27/2018   Procedure: IRRIGATION AND DEBRIDEMENT RIGHT FOOT;  Surgeon: Nadara Mustarduda, Marcus V, MD;  [facility];  Service: Orthopedics;  Laterality: Right;  . JOINT REPLACEMENT    . PACEMAKER IMPLANT N/A 02/10/2019   Procedure: PACEMAKER IMPLANT;  Surgeon: Marinus Mawaylor, Gregg W, MD;  [facility];  Service: Cardiovascular;  Laterality: N/A;  . TONSILLECTOMY AND ADENOIDECTOMY  1948  . TOTAL SHOULDER REPLACEMENT  2010   right shoulder   . TUBAL LIGATION  1979  . TYMPANOSTOMY TUBE PLACEMENT Bilateral     Current Medications: Current Meds  Medication Sig  . acetaminophen (TYLENOL) 325 MG tablet Take 2 tablets (650 mg total) by mouth every 6 (six) hours as needed for mild pain, fever or headache.  Marland Kitchen. HUMALOG KWIKPEN 100 UNIT/ML KwikPen INJECT 10 TO 25 UNITS SUBCUTANEOUSLY THREE TIMES DAILY  . Insulin Glargine (LANTUS) 100 UNIT/ML Solostar Pen Inject 13 Units into the skin daily.  Marland Kitchen. lidocaine (LIDODERM) 5 % Place 1 patch onto the skin daily. Remove & Discard patch within 12 hours or as directed by MD  . pantoprazole (PROTONIX) 40 MG tablet Take 40 mg by mouth daily.  . simvastatin (ZOCOR) 40 MG tablet Take 40 mg by mouth  daily at 8 pm.      Allergies:   Cefaclor, Tetracycline, Vancomycin, Augmentin [amoxicillin-pot clavulanate], and Prednisone   Social History   Socioeconomic History  . Marital status: Divorced    Spouse name: Not on file  . Number of children: 2  . Years of education: [employer]  . Highest education level: Not on file  Occupational History  . Occupation: Retired    Associate Professormployer: RETIRED  Social Needs  . Financial resource strain: Not on file  . Food insecurity    Worry: Not on file    Inability: Not on file  . Transportation needs    Medical: Not on file    Non-medical: Not on file  Tobacco Use  . Smoking status: Never Smoker  . Smokeless tobacco: Never Used  Substance and Sexual Activity  . Alcohol use: Yes    Alcohol/week:  2.0 standard drinks    Types: 2 Glasses of wine per week  . Drug use: Never  . Sexual activity: Never  Lifestyle  . Physical activity    Days per week: Not on file    Minutes per session: Not on file  . Stress: Not on file  Relationships  . Social Musicianconnections    Talks on phone: Not on file    Gets together: Not on file    Attends religious service: Not on file    Active member of club or organization: Not on file    Attends meetings of clubs or organizations: Not on file    Relationship status: Not on file  Other Topics Concern  . Not on file  Social History Narrative   Patient lives at home alone.   Caffeine Use:      Family History: The patient's family history includes Cancer in her mother; Heart Problems in her father.  ROS:   Please see the history of present illness.    Decreased memory, frail, weak, subject to falls.  All other systems reviewed and are negative.  EKGs/Labs/Other Studies Reviewed:    The following studies were reviewed today: Echocardiogram 2020: MPRESSIONS    1. The left ventricle has normal systolic function with an ejection fraction of 60-65%. The cavity size was normal. There is mildly increased left ventricular wall thickness. Left ventricular diastolic Doppler parameters are consistent with impaired  relaxation. No evidence of left ventricular regional wall motion abnormalities.  2. The right ventricle has normal systolic function. The cavity was normal. There is no increase in right ventricular wall thickness.  3. Trivial pericardial effusion is present.  4. The mitral valve is degenerative. Mild thickening of the mitral valve leaflet. There is moderate mitral annular calcification present. No evidence of mitral valve stenosis. Trivial mitral regurgitation.  5. The aortic valve is tricuspid Severe calcifcation of the aortic valve. moderate-severe stenosis of the aortic valve. Mean gradient 24 mmHg with calculated valve area 0.9 cm^2. Possible  paradoxical low flow/low gradient aortic stenosis. If clinical  concern, would consider TEE or cath assess further.  6. The aortic root and ascending aorta are normal in size and structure.  7. The IVC was poorly visualized. Peak RV-RA gradient 43 mmHg.  8. Technically difficult study with poor acoustic windows.  9. Left atrial size was mildly dilated.  EKG:  EKG not performed  Recent Labs: 02/16/2019: Magnesium 1.7 02/17/2019: ALT 41 02/24/2019: Hemoglobin 10.8; Platelets 405 02/25/2019: BUN 15; Creatinine, Ser 0.71; Potassium 4.4; Sodium 136  Recent Lipid Panel No results found for: CHOL, TRIG, HDL, CHOLHDL,  VLDL, LDLCALC, LDLDIRECT  Physical Exam:    VS:  BP (!) 146/58   Pulse 85   Ht 5' (1.524 m)   Wt 109 lb (49.4 kg)   SpO2 98%   BMI 21.29 kg/m     Wt Readings from Last 3 Encounters:  05/20/19 109 lb (49.4 kg)  05/20/19 109 lb (49.4 kg)  04/16/19 108 lb 3.2 oz (49.1 kg)     GEN: Elderly and frail-appearing.  Wearing a mask.. No acute distress HEENT: Normal NECK: No JVD. LYMPHATICS: No lymphadenopathy CARDIAC: RRR.  2/6 systolic murmur right upper sternal border murmur, no gallop, no edema VASCULAR: 2+ bilateral radial pulses, no bruits RESPIRATORY:  Clear to auscultation without rales, wheezing or rhonchi  ABDOMEN: Soft, non-tender, non-distended, No pulsatile mass, MUSCULOSKELETAL: No deformity  SKIN: Warm and dry NEUROLOGIC:  Alert and oriented x 3 PSYCHIATRIC:  Normal affect   ASSESSMENT:    1. Nonrheumatic aortic valve stenosis   2. Essential hypertension, benign   3. Bilateral carotid artery stenosis   4. Mixed hyperlipidemia   5. Educated About Covid-19 Virus Infection    PLAN:    In order of problems listed above:  1. Aortic stenosis is being followed clinically.  The most recent echocardiogram did not reveal evidence of severe aortic stenosis with a calculated valve area 0.9 cm and mean gradient of 24 mmHg.  We will follow clinically.  Likely repeat  an echo again next year when I see her. 2. Target blood pressure desired is 130/80 mmHg.  We discussed low-salt diet. 3. Not addressed 4. Not addressed.  While hospitalizedNo lipid panel was obtained.  Plan clinical follow-up in 1 year relative to aortic valve disease.  She will also be co-managed by the device device clinic.   Medication Adjustments/Labs and Tests Ordered: Current medicines are reviewed at length with the patient today.  Concerns regarding medicines are outlined above.  No orders of the defined types were placed in this encounter.  No orders of the defined types were placed in this encounter.   Patient Instructions  Medication Instructions:  Your physician recommends that you continue on your current medications as directed. Please refer to the Current Medication list given to you today.  If you need a refill on your cardiac medications before your next appointment, please call your pharmacy.   Lab work: None If you have labs (blood work) drawn today and your tests are completely normal, you will receive your results only by: Marland Kitchen MyChart Message (if you have MyChart) OR . A paper copy in the mail If you have any lab test that is abnormal or we need to change your treatment, we will call you to review the results.  Testing/Procedures: None  Follow-Up: At [facility], you and your health needs are our priority.  As part of our continuing mission to provide you with exceptional heart care, we have created designated Provider Care Teams.  These Care Teams include your primary Cardiologist (physician) and Advanced Practice Providers (APPs -  Physician Assistants and Nurse Practitioners) who all work together to provide you with the care you need, when you need it. You will need a follow up appointment in 6-9 months.  Please call our office 2 months in advance to schedule this appointment.  You may see Sinclair Grooms, MD or one of the following Advanced Practice  Providers on your designated Care Team:   Truitt Merle, NP Cecilie Kicks, NP . Kathyrn Drown, NP  Any Other  Special Instructions Will Be Listed Below (If Applicable).       Signed, Lesleigh NoeHenry W Smith III, MD  05/20/2019 12:02 PM    [facility]

## 2019-05-20 ENCOUNTER — Encounter: Payer: Self-pay | Admitting: Interventional Cardiology

## 2019-05-20 ENCOUNTER — Ambulatory Visit (INDEPENDENT_AMBULATORY_CARE_PROVIDER_SITE_OTHER): Payer: Medicare Other | Admitting: Internal Medicine

## 2019-05-20 ENCOUNTER — Encounter: Payer: Self-pay | Admitting: Internal Medicine

## 2019-05-20 ENCOUNTER — Other Ambulatory Visit: Payer: Self-pay

## 2019-05-20 ENCOUNTER — Ambulatory Visit: Payer: Medicare Other | Admitting: Interventional Cardiology

## 2019-05-20 VITALS — BP 146/58 | HR 85 | Ht 60.0 in | Wt 109.0 lb

## 2019-05-20 VITALS — BP 146/58 | HR 82 | Ht 60.0 in | Wt 109.0 lb

## 2019-05-20 DIAGNOSIS — I10 Essential (primary) hypertension: Secondary | ICD-10-CM | POA: Diagnosis not present

## 2019-05-20 DIAGNOSIS — Z950 Presence of cardiac pacemaker: Secondary | ICD-10-CM

## 2019-05-20 DIAGNOSIS — I6523 Occlusion and stenosis of bilateral carotid arteries: Secondary | ICD-10-CM | POA: Diagnosis not present

## 2019-05-20 DIAGNOSIS — R55 Syncope and collapse: Secondary | ICD-10-CM

## 2019-05-20 DIAGNOSIS — I442 Atrioventricular block, complete: Secondary | ICD-10-CM | POA: Insufficient documentation

## 2019-05-20 DIAGNOSIS — I350 Nonrheumatic aortic (valve) stenosis: Secondary | ICD-10-CM

## 2019-05-20 DIAGNOSIS — Z7189 Other specified counseling: Secondary | ICD-10-CM

## 2019-05-20 DIAGNOSIS — E782 Mixed hyperlipidemia: Secondary | ICD-10-CM | POA: Diagnosis not present

## 2019-05-20 NOTE — Telephone Encounter (Signed)

## 2019-05-20 NOTE — Patient Instructions (Signed)
Medication Instructions:  Your physician recommends that you continue on your current medications as directed. Please refer to the Current Medication list given to you today.  Labwork: None ordered.  Testing/Procedures: None ordered.  Follow-Up: Your physician wants you to follow-up in: 9 months with Dr. Taylor.   You will receive a reminder letter in the mail two months in advance. If you don't receive a letter, please call our office to schedule the follow-up appointment.  Remote monitoring is used to monitor your Pacemaker from home. This monitoring reduces the number of office visits required to check your device to one time per year. It allows us to keep an eye on the functioning of your device to ensure it is working properly. You are scheduled for a device check from home on 08/19/2019. You may send your transmission at any time that day. If you have a wireless device, the transmission will be sent automatically. After your physician reviews your transmission, you will receive a postcard with your next transmission date.  Any Other Special Instructions Will Be Listed Below (If Applicable).  If you need a refill on your cardiac medications before your next appointment, please call your pharmacy.    

## 2019-05-20 NOTE — Patient Instructions (Addendum)
Medication Instructions:  Your physician recommends that you continue on your current medications as directed. Please refer to the Current Medication list given to you today.  If you need a refill on your cardiac medications before your next appointment, please call your pharmacy.   Lab work: None If you have labs (blood work) drawn today and your tests are completely normal, you will receive your results only by: . MyChart Message (if you have MyChart) OR . A paper copy in the mail If you have any lab test that is abnormal or we need to change your treatment, we will call you to review the results.  Testing/Procedures: None  Follow-Up: At [facility], you and your health needs are our priority.  As part of our continuing mission to provide you with exceptional heart care, we have created designated Provider Care Teams.  These Care Teams include your primary Cardiologist (physician) and Advanced Practice Providers (APPs -  Physician Assistants and Nurse Practitioners) who all work together to provide you with the care you need, when you need it. You will need a follow up appointment in 6-9 months.  Please call our office 2 months in advance to schedule this appointment.  You may see Henry W Smith III, MD or one of the following Advanced Practice Providers on your designated Care Team:   Lori Gerhardt, NP Laura Ingold, NP . Jill McDaniel, NP  Any Other Special Instructions Will Be Listed Below (If Applicable).    

## 2019-05-20 NOTE — Progress Notes (Signed)
HPI Mrs. Hoffmeister returns today for PPM followup. She is a pleasant 78 yo woman with a h/o AS, syncope and symptomatic bradycardia s/p PPM insertion. She had a serious fall due to sinus arrest.  Allergies  Allergen Reactions  . Cefaclor Anaphylaxis  . Tetracycline Anaphylaxis  . Vancomycin Anaphylaxis  . Augmentin [Amoxicillin-Pot Clavulanate] Other (See Comments)    Reaction unknown >> Anaphylaxis Has patient had a PCN reaction causing immediate rash, facial/tongue/throat swelling, SOB or lightheadedness with hypotension: Unknown Has patient had a PCN reaction causing severe rash involving mucus membranes or skin necrosis: Unknown Has patient had a PCN reaction that required hospitalization: Unknown Has patient had a PCN reaction occurring within the last 10 years: Unknown If all of the above answers are "NO", then may proceed with Cephalosporin use.   . Prednisone Other (See Comments)    Reaction unknown     Current Outpatient Medications  Medication Sig Dispense Refill  . acetaminophen (TYLENOL) 325 MG tablet Take 2 tablets (650 mg total) by mouth every 6 (six) hours as needed for mild pain, fever or headache.    Marland Kitchen HUMALOG KWIKPEN 100 UNIT/ML KwikPen INJECT 10 TO 25 UNITS SUBCUTANEOUSLY THREE TIMES DAILY    . Insulin Glargine (LANTUS) 100 UNIT/ML Solostar Pen Inject 13 Units into the skin daily. 15 mL 11  . lidocaine (LIDODERM) 5 % Place 1 patch onto the skin daily. Remove & Discard patch within 12 hours or as directed by MD 30 patch 0  . pantoprazole (PROTONIX) 40 MG tablet Take 40 mg by mouth daily.    . simvastatin (ZOCOR) 40 MG tablet Take 40 mg by mouth daily at 8 pm.      No current facility-administered medications for this visit.      Past Medical History:  Diagnosis Date  . Aortic stenosis, mild   . Arthritis   . Asthmatic bronchitis    with colds per patient  . Carotid artery disease (HCC)    40-59% bilateral ICA stenosis  . Cataract   . Cutaneous abscess  of right foot   . Glaucoma   . Heart murmur    Dr Tamala Julian is her  cardiologist.   . Hyperlipemia   . Hypertension    Dr. Tamala Julian ~ 2 years ago  . Neck fracture [facility])    july 2013  . Osteoporosis   . Syncope   . Type 1 diabetes mellitus (HCC)     ROS:   All systems reviewed and negative except as noted in the HPI.   Past Surgical History:  Procedure Laterality Date  . ANKLE FRACTURE SURGERY Left 2001   steel plate and 3 screws   . ANTERIOR CERVICAL CORPECTOMY N/A 07/31/2014   Procedure: Cervical Four to Cervical Six Corpectomy;  Surgeon: Floyce Stakes, MD;  Location: [facility];  Service: Neurosurgery;  Laterality: N/A;  C4 to C6 Corpectomy  . BREAST SURGERY  1988  . CATARACT EXTRACTION W/ INTRAOCULAR LENS  IMPLANT, BILATERAL Bilateral 2011   right and then left  . EYE SURGERY    . FRACTURE SURGERY    . [place]  . I&D EXTREMITY Right 09/27/2018   Procedure: IRRIGATION AND DEBRIDEMENT RIGHT FOOT;  Surgeon: Newt Minion, MD;  Location: [place];  Service: Orthopedics;  Laterality: Right;  . JOINT REPLACEMENT    . PACEMAKER IMPLANT N/A 02/10/2019   Procedure: PACEMAKER IMPLANT;  Surgeon: Evans Lance, MD;  Location: [facility] INVASIVE CV  LAB;  Service: Cardiovascular;  Laterality: N/A;  . TONSILLECTOMY AND ADENOIDECTOMY  1948  . TOTAL SHOULDER REPLACEMENT  2010   right shoulder   . TUBAL LIGATION  1979  . TYMPANOSTOMY TUBE PLACEMENT Bilateral      Family History  Problem Relation Age of Onset  . Cancer Mother   . Heart Problems Father      Social History   Socioeconomic History  . Marital status: Divorced    Spouse name: Not on file  . Number of children: 2  . Years of education: [employer]  . Highest education level: Not on file  Occupational History  . Occupation: Retired    Associate Professormployer: RETIRED  Social Needs  . Financial resource strain: Not on file  . Food insecurity    Worry: Not on file    Inability: Not on file  . Transportation needs     Medical: Not on file    Non-medical: Not on file  Tobacco Use  . Smoking status: Never Smoker  . Smokeless tobacco: Never Used  Substance and Sexual Activity  . Alcohol use: Yes    Alcohol/week: 2.0 standard drinks    Types: 2 Glasses of wine per week  . Drug use: Never  . Sexual activity: Never  Lifestyle  . Physical activity    Days per week: Not on file    Minutes per session: Not on file  . Stress: Not on file  Relationships  . Social Musicianconnections    Talks on phone: Not on file    Gets together: Not on file    Attends religious service: Not on file    Active member of club or organization: Not on file    Attends meetings of clubs or organizations: Not on file    Relationship status: Not on file  . Intimate partner violence    Fear of current or ex partner: Not on file    Emotionally abused: Not on file    Physically abused: Not on file    Forced sexual activity: Not on file  Other Topics Concern  . Not on file  Social History Narrative   Patient lives at home alone.   Caffeine Use:      BP (!) 146/58   Pulse 82   Ht 5' (1.524 m)   Wt 109 lb (49.4 kg)   SpO2 98%   BMI 21.29 kg/m   Physical Exam:  Well appearing elderly woman, NAD HEENT: Unremarkable Neck:  No JVD, no thyromegally Lymphatics:  No adenopathy Back:  No CVA tenderness Lungs:  No increased work of breathing HEART:  Regular rate rhythm, no murmurs, no rubs, no clicks Abd:  soft, positive bowel sounds, no organomegally, no rebound, no guarding Ext:  2 plus pulses, no edema, no cyanosis, no clubbing Skin:  No rashes no nodules Neuro:  CN II through XII intact, motor grossly intact  EKG - nsr with atrial pacing  DEVICE  Normal device function.  See PaceArt for details.   Assess/Plan: 1. Sinus node dysfunction  - she is asymptomatic, s/p PPM insertion.  2. PPM - her Biotronik DDD PM is working normally and has about 9 years of battery longevity. 3. HTN - her bp is controlled.   Leonia ReevesGregg  Christoher Drudge,M.D.

## 2019-05-22 LAB — CUP PACEART INCLINIC DEVICE CHECK
Brady Statistic RA Percent Paced: 15 %
Brady Statistic RV Percent Paced: 1 % — CL
Date Time Interrogation Session: 20200625085203
Implantable Lead Implant Date: 20200316
Implantable Lead Implant Date: 20200316
Implantable Lead Location: 753859
Implantable Lead Location: 753860
Implantable Lead Model: 377
Implantable Lead Model: 377
Implantable Lead Serial Number: 81099636
Implantable Lead Serial Number: 81165051
Implantable Pulse Generator Implant Date: 20200316
Lead Channel Pacing Threshold Amplitude: 0.5 V
Lead Channel Pacing Threshold Amplitude: 1.2 V
Lead Channel Pacing Threshold Pulse Width: 0.4 ms
Lead Channel Pacing Threshold Pulse Width: 0.4 ms
Lead Channel Sensing Intrinsic Amplitude: 1.5 mV
Lead Channel Sensing Intrinsic Amplitude: 10 mV
Pulse Gen Model: 407145
Pulse Gen Serial Number: 69557134

## 2019-05-28 ENCOUNTER — Encounter: Payer: Self-pay | Admitting: Physical Medicine & Rehabilitation

## 2019-05-28 ENCOUNTER — Other Ambulatory Visit: Payer: Self-pay

## 2019-05-28 ENCOUNTER — Encounter: Payer: Medicare Other | Attending: Physical Medicine & Rehabilitation | Admitting: Physical Medicine & Rehabilitation

## 2019-05-28 VITALS — BP 162/71 | HR 85 | Temp 98.1°F | Ht 60.0 in | Wt 111.0 lb

## 2019-05-28 DIAGNOSIS — N181 Chronic kidney disease, stage 1: Secondary | ICD-10-CM | POA: Insufficient documentation

## 2019-05-28 DIAGNOSIS — R7309 Other abnormal glucose: Secondary | ICD-10-CM | POA: Diagnosis present

## 2019-05-28 DIAGNOSIS — S06340S Traumatic hemorrhage of right cerebrum without loss of consciousness, sequela: Secondary | ICD-10-CM | POA: Diagnosis present

## 2019-05-28 DIAGNOSIS — E1022 Type 1 diabetes mellitus with diabetic chronic kidney disease: Secondary | ICD-10-CM | POA: Diagnosis present

## 2019-05-28 DIAGNOSIS — S069X9S Unspecified intracranial injury with loss of consciousness of unspecified duration, sequela: Secondary | ICD-10-CM | POA: Diagnosis present

## 2019-05-28 DIAGNOSIS — Z950 Presence of cardiac pacemaker: Secondary | ICD-10-CM | POA: Diagnosis present

## 2019-05-28 DIAGNOSIS — I10 Essential (primary) hypertension: Secondary | ICD-10-CM | POA: Diagnosis present

## 2019-05-28 DIAGNOSIS — S069X9A Unspecified intracranial injury with loss of consciousness of unspecified duration, initial encounter: Secondary | ICD-10-CM | POA: Insufficient documentation

## 2019-05-28 DIAGNOSIS — R269 Unspecified abnormalities of gait and mobility: Secondary | ICD-10-CM | POA: Insufficient documentation

## 2019-05-28 NOTE — Progress Notes (Addendum)
Subjective:    Patient ID: Danielle Clarke, female    DOB: 1941/08/16, 78 y.o.   MRN: [id number]  HPI Right-handed female with history of diabetes mellitus, hyperlipidemia, hypertension, mild aortic stenosis, carotid artery disease, anterior cervical corpectomy, recurrent syncopal episodes presents for follow-up for traumatic left frontal intraparenchymal hematoma.  Last clinic visit on 04/16/2019.  Since that time, she completed therapies.  She has not followed up with Neurosurg. Denies swallowing difficulty. She has not followed up with GI. She saw Endo. BP is slightly elevated, states she was walking this AM.  Denies falls. Overall, she states she is doing better, just not as fast as she used to be.   Pain Inventory Average Pain 5 Pain Right Now 4 My pain is sharp  In the last 24 hours, has pain interfered with the following? General activity 3 Relation with others 3 Enjoyment of life 3 What TIME of day is your pain at its worst? evening Sleep (in general) Good  Pain is worse with: some activites Pain improves with: therapy/exercise Relief from Meds: n/a  Mobility use a cane use a walker ability to climb steps?  yes do you drive?  no  Function retired I need assistance with the following:  meal prep, household duties and shopping  Neuro/Psych trouble walking  Prior Studies Any changes since last visit?  no  Physicians involved in your care Any changes since last visit?  no Primary care Dr. Catha GosselinKevin Little   Family History  Problem Relation Age of Onset  . Cancer Mother   . Heart Problems Father    Social History   Socioeconomic History  . Marital status: Divorced    Spouse name: Not on file  . Number of children: 2  . Years of education: [employer]  . Highest education level: Not on file  Occupational History  . Occupation: Retired    Associate Professormployer: RETIRED  Social Needs  . Financial resource strain: Not on file  . Food insecurity    Worry: Not on file     Inability: Not on file  . Transportation needs    Medical: Not on file    Non-medical: Not on file  Tobacco Use  . Smoking status: Never Smoker  . Smokeless tobacco: Never Used  Substance and Sexual Activity  . Alcohol use: Yes    Alcohol/week: 2.0 standard drinks    Types: 2 Glasses of wine per week  . Drug use: Never  . Sexual activity: Never  Lifestyle  . Physical activity    Days per week: Not on file    Minutes per session: Not on file  . Stress: Not on file  Relationships  . Social Musicianconnections    Talks on phone: Not on file    Gets together: Not on file    Attends religious service: Not on file    Active member of club or organization: Not on file    Attends meetings of clubs or organizations: Not on file    Relationship status: Not on file  Other Topics Concern  . Not on file  Social History Narrative   Patient lives at home alone.   Caffeine Use:    Past Surgical History:  Procedure Laterality Date  . ANKLE FRACTURE SURGERY Left 2001   steel plate and 3 screws   . ANTERIOR CERVICAL CORPECTOMY N/A 07/31/2014   Procedure: Cervical Four to Cervical Six Corpectomy;  Surgeon: Karn CassisErnesto M Botero, MD;  Location: [facility];  Service: Neurosurgery;  Laterality: N/A;  C4 to C6 Corpectomy  . BREAST SURGERY  1988  . CATARACT EXTRACTION W/ INTRAOCULAR LENS  IMPLANT, BILATERAL Bilateral 2011   right and then left  . EYE SURGERY    . FRACTURE SURGERY    . [place]  . I&D EXTREMITY Right 09/27/2018   Procedure: IRRIGATION AND DEBRIDEMENT RIGHT FOOT;  Surgeon: Newt Minion, MD;  Location: [place];  Service: Orthopedics;  Laterality: Right;  . JOINT REPLACEMENT    . PACEMAKER IMPLANT N/A 02/10/2019   Procedure: PACEMAKER IMPLANT;  Surgeon: Evans Lance, MD;  Location: [place] CV LAB;  Service: Cardiovascular;  Laterality: N/A;  . TONSILLECTOMY AND ADENOIDECTOMY  1948  . TOTAL SHOULDER REPLACEMENT  2010   right shoulder   . TUBAL LIGATION  1979  .  TYMPANOSTOMY TUBE PLACEMENT Bilateral    Past Medical History:  Diagnosis Date  . Aortic stenosis, mild   . Arthritis   . Asthmatic bronchitis    with colds per patient  . Carotid artery disease (HCC)    40-59% bilateral ICA stenosis  . Cataract   . Cutaneous abscess of right foot   . Glaucoma   . Heart murmur    Dr Tamala Julian is her  cardiologist.   . Hyperlipemia   . Hypertension    Dr. Tamala Julian ~ 2 years ago  . Neck fracture [facility])    july 2013  . Osteoporosis   . Syncope   . Type 1 diabetes mellitus (HCC)    BP (!) 162/71   Pulse 85   Temp 98.1 F (36.7 C)   Ht 5' (1.524 m)   Wt 111 lb (50.3 kg)   SpO2 92%   BMI 21.68 kg/m   Opioid Risk Score:   Fall Risk Score:  `1  Depression screen PHQ 2/9  Depression screen [facility] 2/9 05/28/2019 04/16/2019 05/08/2015  Decreased Interest 0 0 0  Down, Depressed, Hopeless 0 0 0  PHQ - 2 Score 0 0 0  Altered sleeping - 0 -  Tired, decreased energy - 0 -  Change in appetite - 0 -  Feeling bad or failure about yourself  - 0 -  Trouble concentrating - 0 -  Moving slowly or fidgety/restless - 0 -  Suicidal thoughts - 0 -  PHQ-9 Score - 0 -  Difficult doing work/chores - Not difficult at all -   Review of Systems  Constitutional: Negative.   HENT: Negative.   Eyes: Negative.   Respiratory: Negative.   Cardiovascular: Negative.   Gastrointestinal: Negative.   Endocrine:       Fluctuating blood sugars  Genitourinary: Negative.   Musculoskeletal: Positive for gait problem.  Skin: Positive for wound.       Steri strips have not loosened at all since pacemaker placed-- family questions because told they would come off on their own  Allergic/Immunologic: Negative.   Neurological: Positive for weakness.  Hematological: Negative.   Psychiatric/Behavioral: Negative.   All other systems reviewed and are negative.     Objective:   Physical Exam Constitutional: No distress . Vital signs reviewed. HENT: Normocephalic.  Atraumatic. Eyes:  EOMI. No discharge. Cardiovascular: No JVD.   Respiratory: Normal effort. GI: Non-distended. Musc: No edema or tenderness in extremities. Neurological: Alert and oriented x3. Motor: Grossly 4+-5/5 throughout, except for left ankle (fusion) Skin: Warm and dry.  Psychiatric: Normal mood.  Normal affect.    Assessment & Plan:  Right-handed female with history of diabetes  mellitus, hyperlipidemia, hypertension, mild aortic stenosis, carotid artery disease, anterior cervical corpectomy, recurrent syncopal episodes presents for follow-up for traumatic left frontal intraparenchymal hematoma.  1.  Decreased functional mobility secondary to traumatic left frontal intraparenchymal hematoma             Continue HEP  Has not follow up with Neurosurg - likely does not need to at this point  2. Dysphagia:              Resolved  2. Intermittent coffee-ground emesis.   Follow up with GI - has not made appointment   3. Diabetes mellitus  Cont meds  Cont following up with Endo  4.  Essential hypertension  Cont meds  Elevated today, states she was running this morning  5.  Gait abnormality  Cont HEP  Cont cane for safety

## 2019-06-06 ENCOUNTER — Ambulatory Visit: Payer: Medicare Other | Admitting: Interventional Cardiology

## 2019-08-19 ENCOUNTER — Ambulatory Visit (INDEPENDENT_AMBULATORY_CARE_PROVIDER_SITE_OTHER): Payer: Medicare Other | Admitting: *Deleted

## 2019-08-19 DIAGNOSIS — R55 Syncope and collapse: Secondary | ICD-10-CM

## 2019-08-19 DIAGNOSIS — I442 Atrioventricular block, complete: Secondary | ICD-10-CM

## 2019-08-20 LAB — CUP PACEART REMOTE DEVICE CHECK
Date Time Interrogation Session: 20200923071801
Implantable Lead Implant Date: 20200316
Implantable Lead Implant Date: 20200316
Implantable Lead Location: 753859
Implantable Lead Location: 753860
Implantable Lead Model: 377
Implantable Lead Model: 377
Implantable Lead Serial Number: 81099636
Implantable Lead Serial Number: 81165051
Implantable Pulse Generator Implant Date: 20200316
Pulse Gen Model: 407145
Pulse Gen Serial Number: 69557134

## 2019-08-27 IMAGING — CT CT HEAD WITHOUT CONTRAST
1 series · 15 of 30 positions shown, 19 images · non-contrast
Comparison: Multiple priors, most recent 02/16/2019.

CLINICAL DATA: Continued surveillance intracranial hemorrhage. No
new symptoms. Improving confusion.

EXAM:
CT HEAD WITHOUT CONTRAST
TECHNIQUE: Contiguous axial images were obtained from the base of the skull
through the vertex without intravenous contrast.

[Series 2: head w/(date) · axial · 0.40mm/px · z∈[-196,-46]mm · 15 of 34 slices shown, 19 images]
[im 2/34  brain]
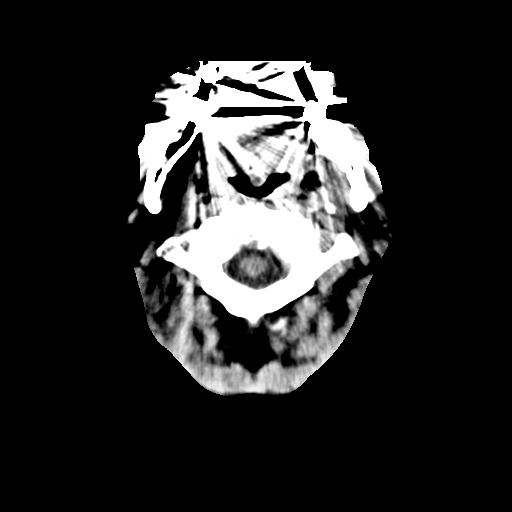
[im 2/34  bone]
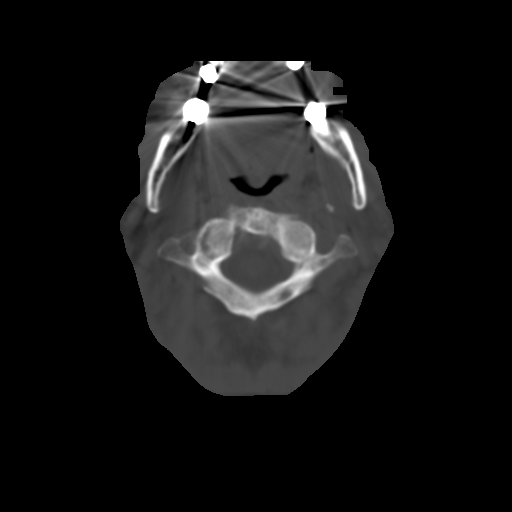
[im 4/34  brain]
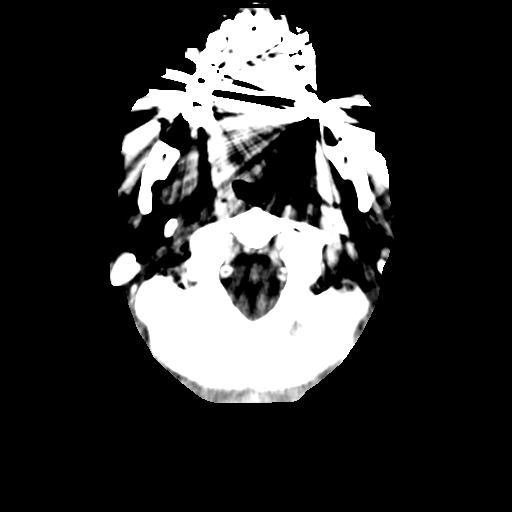
[im 6/34  brain]
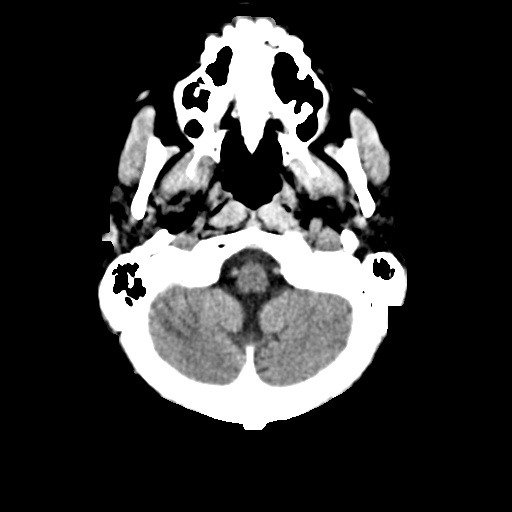
[im 8/34  brain]
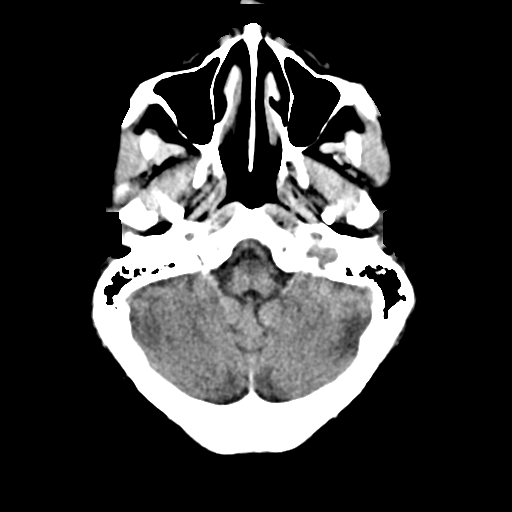
[im 11/34  brain]
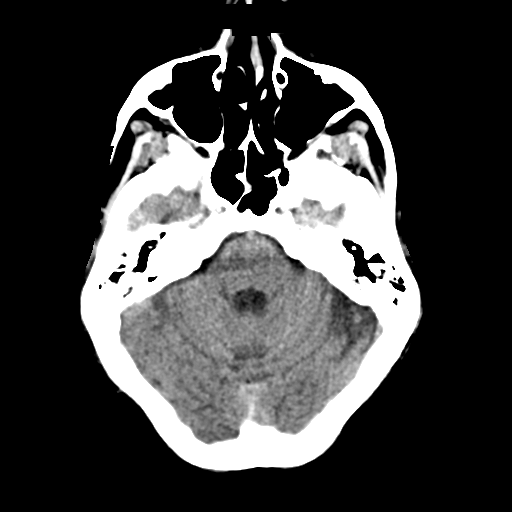
[im 11/34  bone]
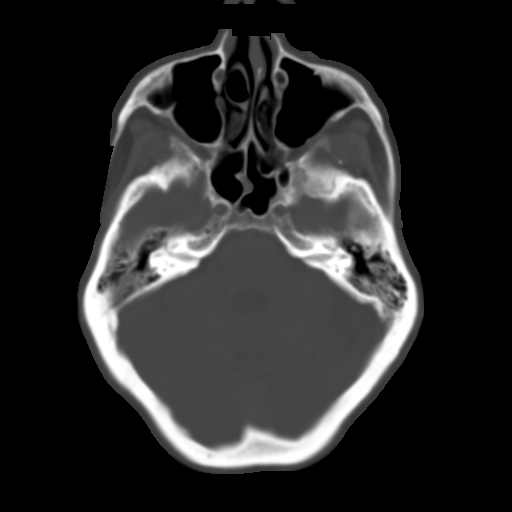
[im 13/34  brain]
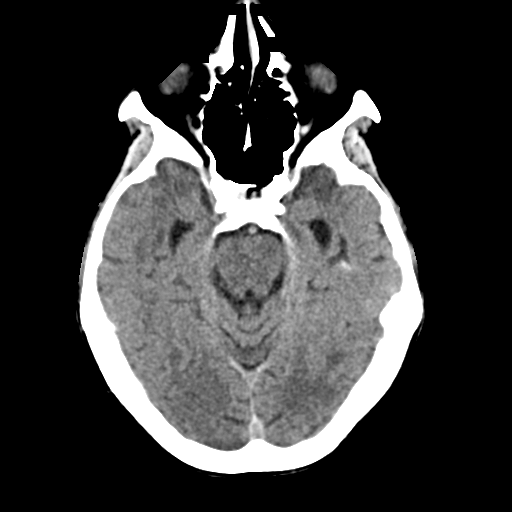
[im 15/34  brain]
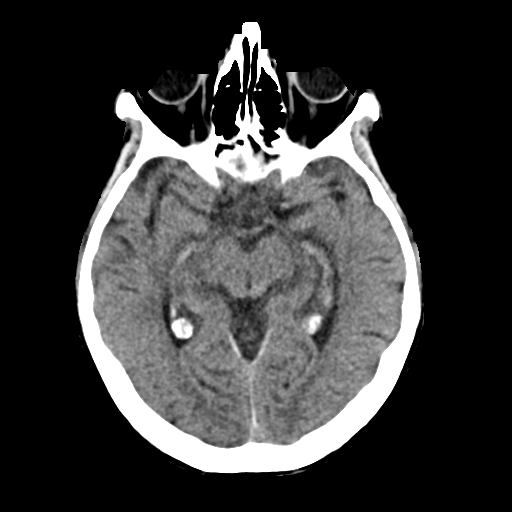
[im 18/34  brain]
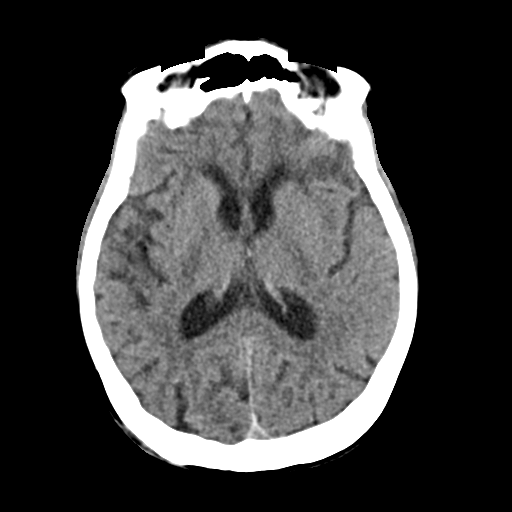
[im 19/34  brain]
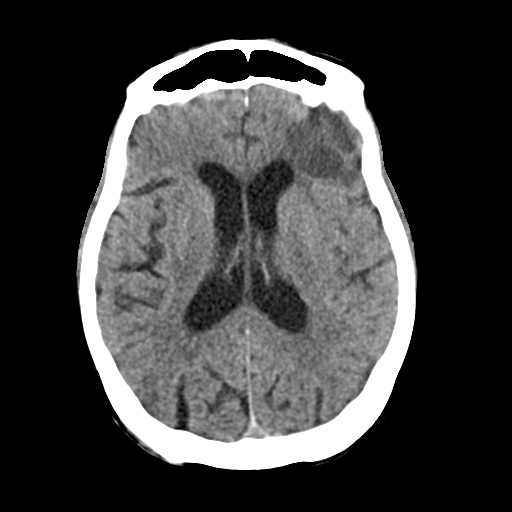
[im 19/34  bone]
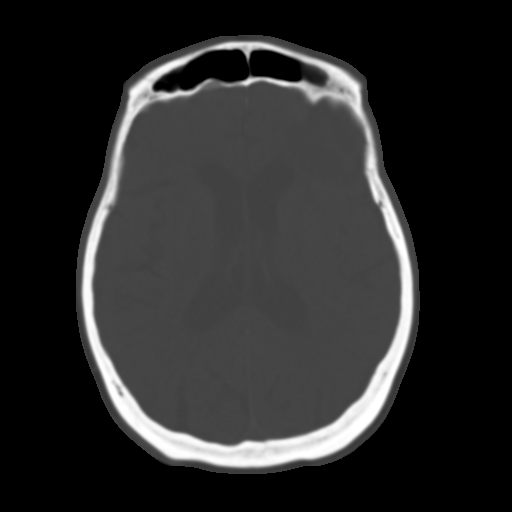
[im 21/34  brain]
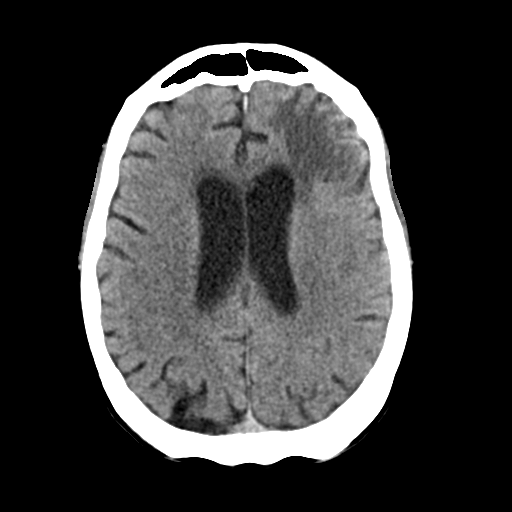
[im 23/34  brain]
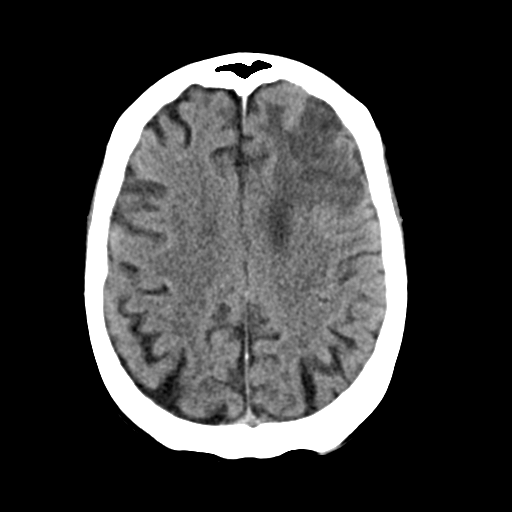
[im 26/34  brain]
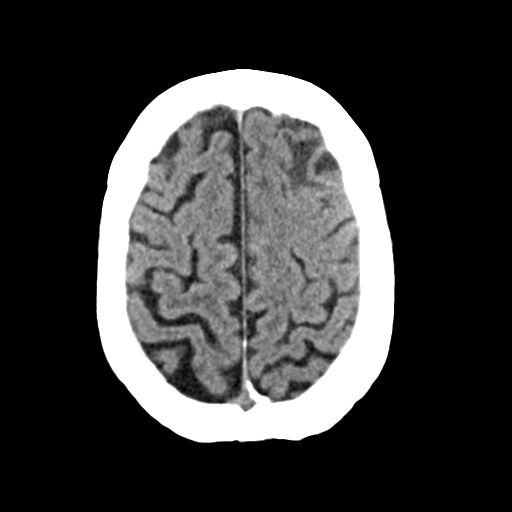
[im 28/34  brain]
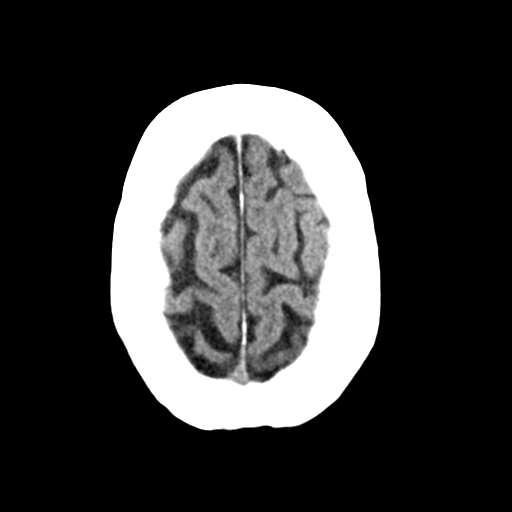
[im 28/34  bone]
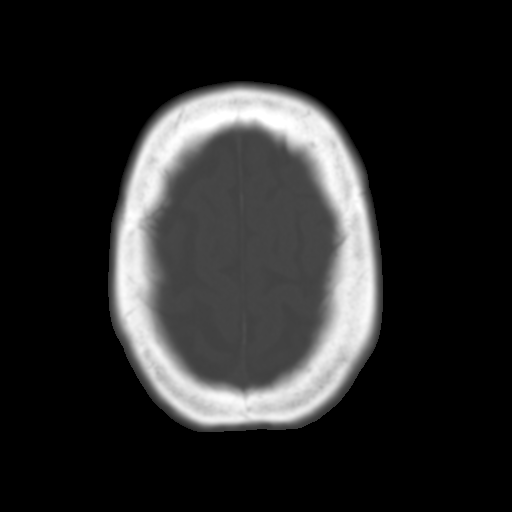
[im 30/34  brain]
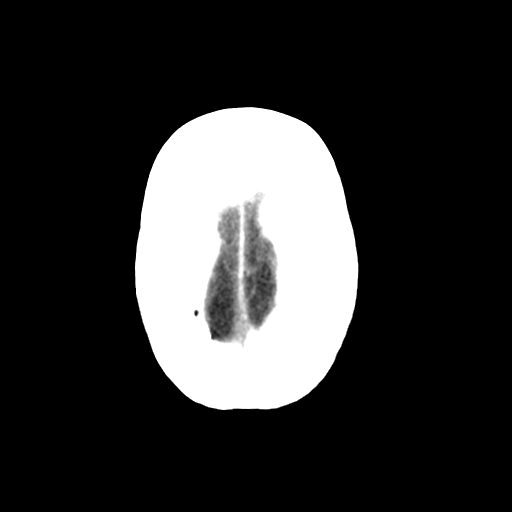
[im 32/34  brain]
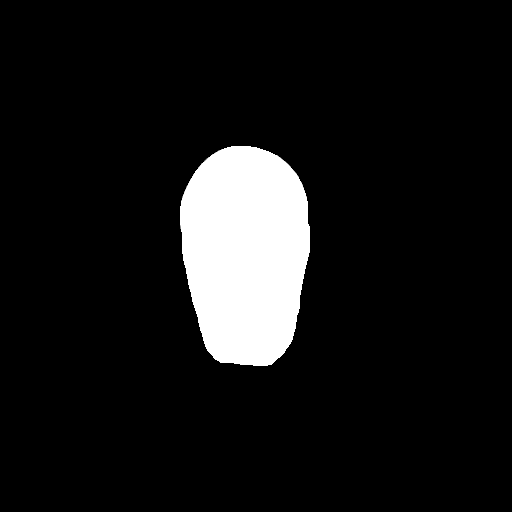

[15 of 30 positions shown; findings below may reference images not displayed]

FINDINGS: Brain:

LEFT frontal hypoattenuation, at the site of previous intracerebral
hematoma, reflecting residual edema as well as encephalomalacia. No
midline shift. No new areas of hemorrhage. As seen on image 20, the
area of continued low attenuation measures 37 x 28 mm.

Generalized atrophy. Chronic microvascular ischemic change.
Hydrocephalus ex vacuo. No subdural hematoma.

Vascular: Calcification of the cavernous internal carotid arteries
consistent with cerebrovascular atherosclerotic disease. No signs of
intracranial large vessel occlusion.

Skull: Calvarium intact.

Sinuses/Orbits: No sinus disease.  Negative orbits.

Other: None.
IMPRESSION: Resolving LEFT frontal intracerebral hematoma. Improved mass effect
and shift.

No new or significant interval findings.

## 2019-08-29 NOTE — Progress Notes (Signed)
Remote pacemaker transmission.   

## 2019-11-18 ENCOUNTER — Ambulatory Visit (INDEPENDENT_AMBULATORY_CARE_PROVIDER_SITE_OTHER): Payer: Medicare Other | Admitting: *Deleted

## 2019-11-18 DIAGNOSIS — I442 Atrioventricular block, complete: Secondary | ICD-10-CM | POA: Diagnosis not present

## 2019-11-18 LAB — CUP PACEART REMOTE DEVICE CHECK
Date Time Interrogation Session: 20201222155251
Implantable Lead Implant Date: 20200316
Implantable Lead Implant Date: 20200316
Implantable Lead Location: 753859
Implantable Lead Location: 753860
Implantable Lead Model: 377
Implantable Lead Model: 377
Implantable Lead Serial Number: 81099636
Implantable Lead Serial Number: 81165051
Implantable Pulse Generator Implant Date: 20200316
Pulse Gen Model: 407145
Pulse Gen Serial Number: 69557134

## 2019-12-01 ENCOUNTER — Encounter: Payer: Medicare HMO | Attending: Physical Medicine & Rehabilitation | Admitting: Physical Medicine & Rehabilitation

## 2019-12-01 ENCOUNTER — Other Ambulatory Visit: Payer: Self-pay

## 2019-12-01 ENCOUNTER — Encounter: Payer: Self-pay | Admitting: Physical Medicine & Rehabilitation

## 2019-12-01 VITALS — BP 169/69 | HR 76 | Temp 97.7°F | Ht 60.0 in | Wt 115.0 lb

## 2019-12-01 DIAGNOSIS — S069X9D Unspecified intracranial injury with loss of consciousness of unspecified duration, subsequent encounter: Secondary | ICD-10-CM | POA: Diagnosis not present

## 2019-12-01 DIAGNOSIS — K920 Hematemesis: Secondary | ICD-10-CM | POA: Diagnosis not present

## 2019-12-01 DIAGNOSIS — E1022 Type 1 diabetes mellitus with diabetic chronic kidney disease: Secondary | ICD-10-CM | POA: Diagnosis not present

## 2019-12-01 DIAGNOSIS — S069X9S Unspecified intracranial injury with loss of consciousness of unspecified duration, sequela: Secondary | ICD-10-CM

## 2019-12-01 DIAGNOSIS — R269 Unspecified abnormalities of gait and mobility: Secondary | ICD-10-CM | POA: Insufficient documentation

## 2019-12-01 DIAGNOSIS — I10 Essential (primary) hypertension: Secondary | ICD-10-CM | POA: Insufficient documentation

## 2019-12-01 DIAGNOSIS — N181 Chronic kidney disease, stage 1: Secondary | ICD-10-CM | POA: Insufficient documentation

## 2019-12-01 NOTE — Progress Notes (Signed)
Subjective:    Patient ID: Danielle Clarke, female    DOB: 1941-02-28, 79 y.o.   MRN: [id number]  HPI Right-handed female with history of diabetes mellitus, hyperlipidemia, hypertension, mild aortic stenosis, carotid artery disease, anterior cervical corpectomy, recurrent syncopal episodes presents for follow-up for traumatic left frontal intraparenchymal hematoma.  Last clinic visit on 05/28/2019.  Since that time, pt states she is doing some exercises. Communication exchanged with PCP regarding carotid imaging and pacemaker eval.  Denies swallowing difficulties. BP is elevated, states it is normally better controlled. Deniesa falls. Denies further coffee-ground emesis. CBGs have been elevated, she is following up with Endo.   Pain Inventory Average Pain 5 Pain Right Now 3 My pain is aching  In the last 24 hours, has pain interfered with the following? General activity 5 Relation with others 4 Enjoyment of life 7 What TIME of day is your pain at its worst? evening Sleep (in general) Good  Pain is worse with: some activites Pain improves with: therapy/exercise Relief from Meds: n/a  Mobility use a cane use a walker ability to climb steps?  yes do you drive?  no  Function retired I need assistance with the following:  meal prep, household duties and shopping  Neuro/Psych trouble walking  Prior Studies Any changes since last visit?  no  Physicians involved in your care Any changes since last visit?  no Primary care Dr. Hulan Fess   Family History  Problem Relation Age of Onset  . Cancer Mother   . Heart Problems Father    Social History   Socioeconomic History  . Marital status: Divorced    Spouse name: Not on file  . Number of children: 2  . Years of education: [employer]  . Highest education level: Not on file  Occupational History  . Occupation: Retired    Fish farm manager: RETIRED  Tobacco Use  . Smoking status: Never Smoker  . Smokeless tobacco: Never  Used  Substance and Sexual Activity  . Alcohol use: Yes    Alcohol/week: 2.0 standard drinks    Types: 2 Glasses of wine per week  . Drug use: Never  . Sexual activity: Never  Other Topics Concern  . Not on file  Social History Narrative   Patient lives at home alone.   Caffeine Use:    Social Determinants of Health   Financial Resource Strain:   . Difficulty of Paying Living Expenses: Not on file  Food Insecurity:   . Worried About Charity fundraiser in the Last Year: Not on file  . Ran Out of Food in the Last Year: Not on file  Transportation Needs:   . Lack of Transportation (Medical): Not on file  . Lack of Transportation (Non-Medical): Not on file  Physical Activity:   . Days of Exercise per Week: Not on file  . Minutes of Exercise per Session: Not on file  Stress:   . Feeling of Stress : Not on file  Social Connections:   . Frequency of Communication with Friends and Family: Not on file  . Frequency of Social Gatherings with Friends and Family: Not on file  . Attends Religious Services: Not on file  . Active Member of Clubs or Organizations: Not on file  . Attends Archivist Meetings: Not on file  . Marital Status: Not on file   Past Surgical History:  Procedure Laterality Date  . ANKLE FRACTURE SURGERY Left 2001   steel plate and 3 screws   .  ANTERIOR CERVICAL CORPECTOMY N/A 07/31/2014   Procedure: Cervical Four to Cervical Six Corpectomy;  Surgeon: Karn Cassis, MD;  Location: [facility];  Service: Neurosurgery;  Laterality: N/A;  C4 to C6 Corpectomy  . BREAST SURGERY  1988  . CATARACT EXTRACTION W/ INTRAOCULAR LENS  IMPLANT, BILATERAL Bilateral 2011   right and then left  . EYE SURGERY    . FRACTURE SURGERY    . HAMMER TOE SURGERY  1998  . I & D EXTREMITY Right 09/27/2018   Procedure: IRRIGATION AND DEBRIDEMENT RIGHT FOOT;  Surgeon: Nadara Mustard, MD;  Location: [facility] OR;  Service: Orthopedics;  Laterality: Right;  . JOINT REPLACEMENT    .  PACEMAKER IMPLANT N/A 02/10/2019   Procedure: PACEMAKER IMPLANT;  Surgeon: Marinus Maw, MD;  Location: [facility] INVASIVE CV LAB;  Service: Cardiovascular;  Laterality: N/A;  . TONSILLECTOMY AND ADENOIDECTOMY  1948  . TOTAL SHOULDER REPLACEMENT  2010   right shoulder   . TUBAL LIGATION  1979  . TYMPANOSTOMY TUBE PLACEMENT Bilateral    Past Medical History:  Diagnosis Date  . Aortic stenosis, mild   . Arthritis   . Asthmatic bronchitis    with colds per patient  . Carotid artery disease (HCC)    40-59% bilateral ICA stenosis  . Cataract   . Cutaneous abscess of right foot   . Glaucoma   . Heart murmur    Dr Katrinka Blazing is her  cardiologist.   . Hyperlipemia   . Hypertension    Dr. Katrinka Blazing ~ 2 years ago  . Neck fracture [facility])    july 2013  . Osteoporosis   . Syncope   . Type 1 diabetes mellitus (HCC)    BP (!) 169/69   Pulse 76   Temp 97.7 F (36.5 C)   Ht 5' (1.524 m)   Wt 115 lb (52.2 kg)   SpO2 95%   BMI 22.46 kg/m   Opioid Risk Score:   Fall Risk Score:  `1  Depression screen PHQ 2/9  Depression screen [facility] 2/9 05/28/2019 04/16/2019 05/08/2015  Decreased Interest 0 0 0  Down, Depressed, Hopeless 0 0 0  PHQ - 2 Score 0 0 0  Altered sleeping - 0 -  Tired, decreased energy - 0 -  Change in appetite - 0 -  Feeling bad or failure about yourself  - 0 -  Trouble concentrating - 0 -  Moving slowly or fidgety/restless - 0 -  Suicidal thoughts - 0 -  PHQ-9 Score - 0 -  Difficult doing work/chores - Not difficult at all -   Review of Systems  Constitutional: Negative.   HENT: Negative.   Eyes: Negative.   Respiratory: Negative.   Cardiovascular: Negative.   Gastrointestinal: Negative for diarrhea (Loose at times).  Endocrine:       Fluctuating blood sugars  Genitourinary: Negative.   Musculoskeletal: Positive for arthralgias.  Skin: Negative.   Allergic/Immunologic: Negative.   Neurological: Negative.   Hematological: Negative.   Psychiatric/Behavioral: Negative.        Objective:   Physical Exam Constitutional: No distress . Vital signs reviewed. HENT: Normocephalic.  Atraumatic. Eyes: EOMI. No discharge. Cardiovascular: No JVD.   Respiratory: Normal effort. GI: Non-distended. Musc: No edema or tenderness in extremities. Neurological: Alert and oriented x3 Motor: Grossly 5/5 throughout, except for left ankle (fusion) Skin: Warm and dry.  Psychiatric: Normal mood.  Normal affect.    Assessment & Plan:  Right-handed female with history of diabetes mellitus, hyperlipidemia,  hypertension, mild aortic stenosis, carotid artery disease, anterior cervical corpectomy, recurrent syncopal episodes presents for follow-up for traumatic left frontal intraparenchymal hematoma.  1.  Decreased functional mobility secondary to traumatic left frontal intraparenchymal hematoma             Continue HEP  Encouraged strength training with weights in the house  2. Intermittent coffee-ground emesis.   No repeat episode, does not need GI appointment at present   3. Diabetes mellitus  Cont meds  Cont following up with Endo  Increase exercise to bring down CBGs  4.  Essential hypertension  Cont meds  Elevated today, states normally under better control  5.  Gait abnormality  Cont HEP  Cont cane for safety  Patient would prefer to follow up in 1 year

## 2019-12-02 NOTE — Progress Notes (Signed)
Cardiology Office Note:    Date:  12/02/2019   ID:  Danielle Clarke, DOB 1941-01-05, MRN [id number]  PCP:  Catha Gosselin, MD  Cardiologist:  Lesleigh Noe, MD   Referring MD: Catha Gosselin, MD   No chief complaint on file.   History of Present Illness:    Danielle Clarke is a 79 y.o. female with a hx of aortic stenosis, recurring episodes of syncope, DM I,  bilateral carotid disease, and essential hypertension.. 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  She is accompanied by her daughter, the stewardess.  She has moved here to be closer to her mom.  This is been very helpful to Danielle Clarke.  No palpitations, orthopnea, PND, or syncope.  Denies dyspnea on exertion.  There is no peripheral edema.  No angina.   Past Medical History:  Diagnosis Date  . Aortic stenosis, mild   . Arthritis   . Asthmatic bronchitis    with colds per patient  . Carotid artery disease (HCC)    40-59% bilateral ICA stenosis  . Cataract   . Cutaneous abscess of right foot   . Glaucoma   . Heart murmur    Dr Katrinka Blazing is her  cardiologist.   . Hyperlipemia   . Hypertension    Dr. Katrinka Blazing ~ 2 years ago  . Neck fracture [facility])    july 2013  . Osteoporosis   . Syncope   . Type 1 diabetes mellitus (HCC)     Past Surgical History:  Procedure Laterality Date  . ANKLE FRACTURE SURGERY Left 2001   steel plate and 3 screws   . ANTERIOR CERVICAL CORPECTOMY N/A 07/31/2014   Procedure: Cervical Four to Cervical Six Corpectomy;  Surgeon: Karn Cassis, MD;  Location: [facility];  Service: Neurosurgery;  Laterality: N/A;  C4 to C6 Corpectomy  . BREAST SURGERY  1988  . CATARACT EXTRACTION W/ INTRAOCULAR LENS  IMPLANT, BILATERAL Bilateral 2011   right and then left  . EYE SURGERY    . FRACTURE SURGERY    . HAMMER TOE SURGERY  1998  . I & D EXTREMITY Right 09/27/2018   Procedure: IRRIGATION AND DEBRIDEMENT RIGHT FOOT;  Surgeon: Nadara Mustard, MD;  Location: [facility] OR;  Service: Orthopedics;   Laterality: Right;  . JOINT REPLACEMENT    . PACEMAKER IMPLANT N/A 02/10/2019   Procedure: PACEMAKER IMPLANT;  Surgeon: Marinus Maw, MD;  Location: [facility] INVASIVE CV LAB;  Service: Cardiovascular;  Laterality: N/A;  . TONSILLECTOMY AND ADENOIDECTOMY  1948  . TOTAL SHOULDER REPLACEMENT  2010   right shoulder   . TUBAL LIGATION  1979  . TYMPANOSTOMY TUBE PLACEMENT Bilateral     Current Medications: No outpatient medications have been marked as taking for the 12/04/19 encounter (Appointment) with Lyn Records, MD.     Allergies:   Cefaclor, Tetracycline, Vancomycin, Augmentin [amoxicillin-pot clavulanate], and Prednisone   Social History   Socioeconomic History  . Marital status: Divorced    Spouse name: Not on file  . Number of children: 2  . Years of education: [employer]  . Highest education level: Not on file  Occupational History  . Occupation: Retired    Associate Professor: RETIRED  Tobacco Use  . Smoking status: Never Smoker  . Smokeless tobacco: Never Used  Substance and Sexual Activity  . Alcohol use: Yes    Alcohol/week: 2.0 standard drinks    Types: 2 Glasses of wine per week  . Drug use: Never  .  Sexual activity: Never  Other Topics Concern  . Not on file  Social History Narrative   Patient lives at home alone.   Caffeine Use:    Social Determinants of Health   Financial Resource Strain:   . Difficulty of Paying Living Expenses: Not on file  Food Insecurity:   . Worried About Charity fundraiser in the Last Year: Not on file  . Ran Out of Food in the Last Year: Not on file  Transportation Needs:   . Lack of Transportation (Medical): Not on file  . Lack of Transportation (Non-Medical): Not on file  Physical Activity:   . Days of Exercise per Week: Not on file  . Minutes of Exercise per Session: Not on file  Stress:   . Feeling of Stress : Not on file  Social Connections:   . Frequency of Communication with Friends and Family: Not on file  . Frequency of  Social Gatherings with Friends and Family: Not on file  . Attends Religious Services: Not on file  . Active Member of Clubs or Organizations: Not on file  . Attends Archivist Meetings: Not on file  . Marital Status: Not on file     Family History: The patient's family history includes Cancer in her mother; Heart Problems in her father.  ROS:   Please see the history of present illness.    No neurological complaints all other systems reviewed and are negative.  EKGs/Labs/Other Studies Reviewed:    The following studies were reviewed today:  CAROTID DOPPLER 02/16/2019 Summary: Right: There is no evidence of deep vein thrombosis in the lower extremity. No cystic structure found in the popliteal fossa. Left: There is no evidence of deep vein thrombosis in the lower extremity. No cystic structure found in the popliteal fossa.   *See table(s) above for measurements and observations.  Electronically signed by Harold Barban MD on 02/17/2019 at 8:39:03 AM.      ECHOCARDIOGRAM 01/2019: IMPRESSIONS    1. The left ventricle has normal systolic function with an ejection fraction of 60-65%. The cavity size was normal. There is mildly increased left ventricular wall thickness. Left ventricular diastolic Doppler parameters are consistent with impaired  relaxation. No evidence of left ventricular regional wall motion abnormalities.  2. The right ventricle has normal systolic function. The cavity was normal. There is no increase in right ventricular wall thickness.  3. Trivial pericardial effusion is present.  4. The mitral valve is degenerative. Mild thickening of the mitral valve leaflet. There is moderate mitral annular calcification present. No evidence of mitral valve stenosis. Trivial mitral regurgitation.  5. The aortic valve is tricuspid Severe calcifcation of the aortic valve. moderate-severe stenosis of the aortic valve. Mean gradient 24 mmHg with calculated valve area 0.9  cm^2. Possible paradoxical low flow/low gradient aortic stenosis. If clinical  concern, would consider TEE or cath assess further.  6. The aortic root and ascending aorta are normal in size and structure.  7. The IVC was poorly visualized. Peak RV-RA gradient 43 mmHg.  8. Technically difficult study with poor acoustic windows.  9. Left atrial size was mildly dilated.  EKG:  EKG normal sinus rhythm without pacing noted on tracing performed June 09, 2019.  Recent Labs: 02/16/2019: Magnesium 1.7 02/17/2019: ALT 41 02/24/2019: Hemoglobin 10.8; Platelets 405 02/25/2019: BUN 15; Creatinine, Ser 0.71; Potassium 4.4; Sodium 136  Recent Lipid Panel No results found for: CHOL, TRIG, HDL, CHOLHDL, VLDL, LDLCALC, LDLDIRECT  Physical Exam:  VS:  There were no vitals taken for this visit.    Wt Readings from Last 3 Encounters:  12/01/19 115 lb (52.2 kg)  05/28/19 111 lb (50.3 kg)  05/20/19 109 lb (49.4 kg)     GEN: Frail-appearing. No acute distress HEENT: Normal NECK: No JVD. LYMPHATICS: No lymphadenopathy CARDIAC: 3/6 crescendo decrescendo systolic murmur RRR without diastolic murmur, gallop, or edema. VASCULAR:  Normal Pulses. No bruits. RESPIRATORY:  Clear to auscultation without rales, wheezing or rhonchi  ABDOMEN: Soft, non-tender, non-distended, No pulsatile mass, MUSCULOSKELETAL: No deformity  SKIN: Warm and dry NEUROLOGIC:  Alert and oriented x 3 PSYCHIATRIC:  Normal affect   ASSESSMENT:    1. Pacemaker   2. Heart block AV complete (HCC)   3. Essential hypertension, benign   4. Nonrheumatic aortic valve stenosis   5. Bilateral carotid artery stenosis   6. Mixed hyperlipidemia    PLAN:    In order of problems listed above:  1. Normal pacemaker function 2. Being treated with pacemaker 3. Excellent blood pressure control 4. No current symptoms of severe aortic stenosis.  Moderate to severe when last evaluated.  We will plan to do a repeat study in May with office visit  in June. 5. Carotids are not severely obstructed.  Plan to perform a repeat study about 18 months from prior. 6. Lipids should be controlled to LDL less than 70.  Clinical follow-up in 6 months for aortic stenosis.  Also monitoring carotid disease.   Medication Adjustments/Labs and Tests Ordered: Current medicines are reviewed at length with the patient today.  Concerns regarding medicines are outlined above.  No orders of the defined types were placed in this encounter.  No orders of the defined types were placed in this encounter.   There are no Patient Instructions on file for this visit.   Signed, Lesleigh Noe, MD  12/02/2019 7:04 PM    [facility]

## 2019-12-04 ENCOUNTER — Ambulatory Visit (INDEPENDENT_AMBULATORY_CARE_PROVIDER_SITE_OTHER): Payer: Medicare Other | Admitting: Interventional Cardiology

## 2019-12-04 ENCOUNTER — Encounter: Payer: Self-pay | Admitting: Interventional Cardiology

## 2019-12-04 ENCOUNTER — Other Ambulatory Visit: Payer: Self-pay

## 2019-12-04 VITALS — BP 122/46 | HR 79 | Ht 60.0 in | Wt 117.4 lb

## 2019-12-04 DIAGNOSIS — Z950 Presence of cardiac pacemaker: Secondary | ICD-10-CM | POA: Diagnosis not present

## 2019-12-04 DIAGNOSIS — I442 Atrioventricular block, complete: Secondary | ICD-10-CM

## 2019-12-04 DIAGNOSIS — I10 Essential (primary) hypertension: Secondary | ICD-10-CM | POA: Diagnosis not present

## 2019-12-04 DIAGNOSIS — I350 Nonrheumatic aortic (valve) stenosis: Secondary | ICD-10-CM | POA: Diagnosis not present

## 2019-12-04 DIAGNOSIS — E782 Mixed hyperlipidemia: Secondary | ICD-10-CM

## 2019-12-04 DIAGNOSIS — I6523 Occlusion and stenosis of bilateral carotid arteries: Secondary | ICD-10-CM

## 2019-12-04 NOTE — Patient Instructions (Signed)
Medication Instructions:  Your physician recommends that you continue on your current medications as directed. Please refer to the Current Medication list given to you today.  *If you need a refill on your cardiac medications before your next appointment, please call your pharmacy*  Lab Work: None If you have labs (blood work) drawn today and your tests are completely normal, you will receive your results only by: Marland Kitchen MyChart Message (if you have MyChart) OR . A paper copy in the mail If you have any lab test that is abnormal or we need to change your treatment, we will call you to review the results.  Testing/Procedures: Your physician has requested that you have an echocardiogram in May. Echocardiography is a painless test that uses sound waves to create images of your heart. It provides your doctor with information about the size and shape of your heart and how well your heart's chambers and valves are working. This procedure takes approximately one hour. There are no restrictions for this procedure.  Your physician has requested that you have a carotid duplex in September or October. This test is an ultrasound of the carotid arteries in your neck. It looks at blood flow through these arteries that supply the brain with blood. Allow one hour for this exam. There are no restrictions or special instructions.    Follow-Up: At [facility], you and your health needs are our priority.  As part of our continuing mission to provide you with exceptional heart care, we have created designated Provider Care Teams.  These Care Teams include your primary Cardiologist (physician) and Advanced Practice Providers (APPs -  Physician Assistants and Nurse Practitioners) who all work together to provide you with the care you need, when you need it.  Your next appointment:   5 month(s)  The format for your next appointment:   In Person  Provider:   You may see Lesleigh Noe, MD or one of the  following Advanced Practice Providers on your designated Care Team:    Norma Fredrickson, NP  [name], NP  [name], NP   Other Instructions

## 2020-01-01 DIAGNOSIS — M810 Age-related osteoporosis without current pathological fracture: Secondary | ICD-10-CM | POA: Diagnosis not present

## 2020-01-01 DIAGNOSIS — E109 Type 1 diabetes mellitus without complications: Secondary | ICD-10-CM | POA: Diagnosis not present

## 2020-01-02 ENCOUNTER — Telehealth: Payer: Self-pay | Admitting: Interventional Cardiology

## 2020-01-02 NOTE — Telephone Encounter (Signed)
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 She is wondering if it is ready to be picked up    Please call

## 2020-01-02 NOTE — Telephone Encounter (Signed)
Spoke with pt and made her aware that I did receive her paperwork this morning and I will have Dr. Katrinka Blazing review it once he is back in the office.  Pt appreciative for call.

## 2020-01-16 NOTE — Telephone Encounter (Signed)
Spoke with pt and she would like to pick up letter.  Advised I will place downstairs.  Pt appreciative for call.

## 2020-01-16 NOTE — Telephone Encounter (Signed)
Follow up   Patient is returning call to see if you can come pick up her paperwork that she dropped off for the DMV.

## 2020-01-21 DIAGNOSIS — H5203 Hypermetropia, bilateral: Secondary | ICD-10-CM | POA: Diagnosis not present

## 2020-01-21 DIAGNOSIS — E109 Type 1 diabetes mellitus without complications: Secondary | ICD-10-CM | POA: Diagnosis not present

## 2020-02-17 ENCOUNTER — Ambulatory Visit (INDEPENDENT_AMBULATORY_CARE_PROVIDER_SITE_OTHER): Payer: Medicare HMO | Admitting: *Deleted

## 2020-02-17 DIAGNOSIS — I442 Atrioventricular block, complete: Secondary | ICD-10-CM | POA: Diagnosis not present

## 2020-02-17 LAB — CUP PACEART REMOTE DEVICE CHECK
Date Time Interrogation Session: 20210323080448
Implantable Lead Implant Date: 20200316
Implantable Lead Implant Date: 20200316
Implantable Lead Location: 753859
Implantable Lead Location: 753860
Implantable Lead Model: 377
Implantable Lead Model: 377
Implantable Lead Serial Number: 81099636
Implantable Lead Serial Number: 81165051
Implantable Pulse Generator Implant Date: 20200316
Pulse Gen Model: 407145
Pulse Gen Serial Number: 69557134

## 2020-02-18 NOTE — Progress Notes (Signed)
PPM Remote  

## 2020-04-02 ENCOUNTER — Other Ambulatory Visit: Payer: Self-pay

## 2020-04-02 ENCOUNTER — Ambulatory Visit (HOSPITAL_COMMUNITY): Payer: Medicare Other | Attending: Cardiology

## 2020-04-02 DIAGNOSIS — I350 Nonrheumatic aortic (valve) stenosis: Secondary | ICD-10-CM | POA: Insufficient documentation

## 2020-04-27 NOTE — Progress Notes (Signed)
Cardiology Office Note:    Date:  04/28/2020   ID:  ZAAKIRAH KISTNER, DOB 06/10/1941, MRN [id number]  PCP:  Hulan Fess, MD  Cardiologist:  Sinclair Grooms, MD   Referring MD: Hulan Fess, MD   Chief Complaint  Patient presents with  . Coronary Artery Disease  . Cardiac Valve Problem    History of Present Illness:    Danielle Clarke is a 79 y.o. female with a hx of aortic stenosis, recurring episodes of syncope,DM I,bilateral carotid disease, and essential hypertension.. 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  She is doing okay.  Still independent.  No syncope, orthopnea, PND, dyspnea on exertion, or angina.  No palpitations.  Denies lower extremity swelling.  Past Medical History:  Diagnosis Date  . Aortic stenosis, mild   . Arthritis   . Asthmatic bronchitis    with colds per patient  . Carotid artery disease (HCC)    40-59% bilateral ICA stenosis  . Cataract   . Cutaneous abscess of right foot   . Glaucoma   . Heart murmur    Dr Tamala Julian is her  cardiologist.   . Hyperlipemia   . Hypertension    Dr. Tamala Julian ~ 2 years ago  . Neck fracture [facility])    july 2013  . Osteoporosis   . Syncope   . Type 1 diabetes mellitus ([place])     Past Surgical History:  Procedure Laterality Date  . ANKLE FRACTURE SURGERY Left 2001   steel plate and 3 screws   . ANTERIOR CERVICAL CORPECTOMY N/A 07/31/2014   Procedure: Cervical Four to Cervical Six Corpectomy;  Surgeon: Floyce Stakes, MD;  Location: [facility] NEURO ORS;  Service: Neurosurgery;  Laterality: N/A;  C4 to C6 Corpectomy  . BREAST SURGERY  1988  . CATARACT EXTRACTION W/ INTRAOCULAR LENS  IMPLANT, BILATERAL Bilateral 2011   right and then left  . EYE SURGERY    . FRACTURE SURGERY    . [place]  . I & D EXTREMITY Right 09/27/2018   Procedure: IRRIGATION AND DEBRIDEMENT RIGHT FOOT;  Surgeon: Newt Minion, MD;  Location: [place];  Service: Orthopedics;  Laterality: Right;  . JOINT REPLACEMENT    .  PACEMAKER IMPLANT N/A 02/10/2019   Procedure: PACEMAKER IMPLANT;  Surgeon: Evans Lance, MD;  Location: [place] CV LAB;  Service: Cardiovascular;  Laterality: N/A;  . TONSILLECTOMY AND ADENOIDECTOMY  1948  . TOTAL SHOULDER REPLACEMENT  2010   right shoulder   . TUBAL LIGATION  1979  . TYMPANOSTOMY TUBE PLACEMENT Bilateral     Current Medications: Current Meds  Medication Sig  . acetaminophen (TYLENOL) 325 MG tablet Take 2 tablets (650 mg total) by mouth every 6 (six) hours as needed for mild pain, fever or headache.  . Cyanocobalamin (VITAMIN B-12 PO) Take 1 tablet by mouth daily.  Marland Kitchen HUMALOG KWIKPEN 100 UNIT/ML KwikPen INJECT 10 TO 25 UNITS SUBCUTANEOUSLY THREE TIMES DAILY  . Insulin Glargine (LANTUS) 100 UNIT/ML Solostar Pen Inject 13 Units into the skin daily.  Marland Kitchen lidocaine (LIDODERM) 5 % Place 1 patch onto the skin daily. Remove & Discard patch within 12 hours or as directed by MD  . simvastatin (ZOCOR) 40 MG tablet Take 40 mg by mouth daily.  Marland Kitchen VITAMIN D PO Take 1 tablet by mouth daily.     Allergies:   Cefaclor, Tetracycline, Vancomycin, Augmentin [amoxicillin-pot clavulanate], and Prednisone   Social History   Socioeconomic History  . Marital status:  Divorced    Spouse name: Not on file  . Number of children: 2  . Years of education: [employer]  . Highest education level: Not on file  Occupational History  . Occupation: Retired    Associate Professor: RETIRED  Tobacco Use  . Smoking status: Never Smoker  . Smokeless tobacco: Never Used  Substance and Sexual Activity  . Alcohol use: Yes    Alcohol/week: 2.0 standard drinks    Types: 2 Glasses of wine per week  . Drug use: Never  . Sexual activity: Never  Other Topics Concern  . Not on file  Social History Narrative   Patient lives at home alone.   Caffeine Use:    Social Determinants of Health   Financial Resource Strain:   . Difficulty of Paying Living Expenses:   Food Insecurity:   . Worried About Brewing technologist in the Last Year:   . Barista in the Last Year:   Transportation Needs:   . Freight forwarder (Medical):   Marland Kitchen Lack of Transportation (Non-Medical):   Physical Activity:   . Days of Exercise per Week:   . Minutes of Exercise per Session:   Stress:   . Feeling of Stress :   Social Connections:   . Frequency of Communication with Friends and Family:   . Frequency of Social Gatherings with Friends and Family:   . Attends Religious Services:   . Active Member of Clubs or Organizations:   . Attends Banker Meetings:   Marland Kitchen Marital Status:      Family History: The patient's family history includes Cancer in her mother; Heart Problems in her father.  ROS:   Please see the history of present illness.    Seasonal allergies with wheezing all other systems reviewed and are negative.  EKGs/Labs/Other Studies Reviewed:    The following studies were reviewed today: 2 D Doppler ECHOCARDIOGRAM 03/2020: IMPRESSIONS    1. Left ventricular ejection fraction, by estimation, is 60 to 65%. The  left ventricle has normal function. The left ventricle has no regional  wall motion abnormalities. There is mild concentric left ventricular  hypertrophy. Left ventricular diastolic  parameters are consistent with Grade I diastolic dysfunction (impaired  relaxation). Elevated left atrial pressure. The average left ventricular  global longitudinal strain is -12.9 %.  2. Right ventricular systolic function is normal. The right ventricular  size is normal. There is mildly elevated pulmonary artery systolic  pressure. The estimated right ventricular systolic pressure is 33.7 mmHg.  3. The mitral valve is degenerative. Mild mitral valve regurgitation. No  evidence of mitral stenosis.  4. The aortic valve is normal in structure. Aortic valve regurgitation is  not visualized. Moderate aortic valve stenosis.  5. The inferior vena cava is normal in size with greater than 50%    respiratory variability, suggesting right atrial pressure of 3 mmHg.   Comparison(s): 02/07/19 EF 60-65%. Moderate-severe AS mean PG,  peak PG.   EKG:  EKG atrial pacing with poor R wave progression V1 through V4.  Recent Labs: No results found for requested labs within last 8760 hours.  Recent Lipid Panel No results found for: CHOL, TRIG, HDL, CHOLHDL, VLDL, LDLCALC, LDLDIRECT  Physical Exam:    VS:  BP (!) 156/68   Pulse 76   Ht 5' (1.524 m)   Wt 118 lb 12.8 oz (53.9 kg)   SpO2 98%   BMI 23.20 kg/m     Wt  Readings from Last 3 Encounters:  04/28/20 118 lb 12.8 oz (53.9 kg)  12/04/19 117 lb 6.4 oz (53.3 kg)  12/01/19 115 lb (52.2 kg)     GEN: Elderly and appears older than stated age. No acute distress HEENT: Normal NECK: No JVD. LYMPHATICS: No lymphadenopathy CARDIAC: 3/6 crescendo decrescendo right upper sternal diamond-shaped murmur with radiation into the carotids bilaterally.  RRR without murmur, gallop, or edema. VASCULAR:  Normal Pulses. No bruits. RESPIRATORY:  Clear to auscultation without rales, wheezing or rhonchi  ABDOMEN: Soft, non-tender, non-distended, No pulsatile mass, MUSCULOSKELETAL: No deformity  SKIN: Warm and dry NEUROLOGIC:  Alert and oriented x 3 PSYCHIATRIC:  Normal affect   ASSESSMENT:    1. Nonrheumatic aortic valve stenosis   2. Essential hypertension, benign   3. Mixed hyperlipidemia   4. Bilateral carotid artery stenosis   5. Pacemaker   6. Educated about COVID-19 virus infection    PLAN:    In order of problems listed above:  1. Moderate to moderately severe aortic stenosis currently asymptomatic with no restriction of physical activities.  9-month clinical follow-up.  Echo in 1 year. 2. Repeat blood pressure today 140/60.  Low-salt diet is recommended.  If still elevated when seen on next visit, consider starting ARB or ACE given diabetes.  This should be for kidney protection. 3. Most recent lipids revealed an LDL  of 82 in December.  Continue simvastatin 40 mg daily 4. Continue secondary prevention measures 5. Normal pacemaker function when last evaluated in 6. Has had COVID-19 vaccine.  Overall education and awareness concerning secondary risk prevention was discussed in detail: LDL less than 70, hemoglobin A1c less than 7, blood pressure target less than 130/80 mmHg, >150 minutes of moderate aerobic activity per week, avoidance of smoking, weight control (via diet and exercise), and continued surveillance/management of/for obstructive sleep apnea.    Medication Adjustments/Labs and Tests Ordered: Current medicines are reviewed at length with the patient today.  Concerns regarding medicines are outlined above.  Orders Placed This Encounter  Procedures  . EKG 12-Lead   No orders of the defined types were placed in this encounter.   There are no Patient Instructions on file for this visit.   Signed, Lesleigh Noe, MD  04/28/2020 2:47 PM    [facility]

## 2020-04-28 ENCOUNTER — Ambulatory Visit (INDEPENDENT_AMBULATORY_CARE_PROVIDER_SITE_OTHER): Payer: Medicare Other | Admitting: Interventional Cardiology

## 2020-04-28 ENCOUNTER — Other Ambulatory Visit: Payer: Self-pay

## 2020-04-28 ENCOUNTER — Encounter: Payer: Self-pay | Admitting: Interventional Cardiology

## 2020-04-28 VITALS — BP 156/68 | HR 76 | Ht 60.0 in | Wt 118.8 lb

## 2020-04-28 DIAGNOSIS — I6523 Occlusion and stenosis of bilateral carotid arteries: Secondary | ICD-10-CM | POA: Diagnosis not present

## 2020-04-28 DIAGNOSIS — E782 Mixed hyperlipidemia: Secondary | ICD-10-CM | POA: Diagnosis not present

## 2020-04-28 DIAGNOSIS — I350 Nonrheumatic aortic (valve) stenosis: Secondary | ICD-10-CM | POA: Diagnosis not present

## 2020-04-28 DIAGNOSIS — Z7189 Other specified counseling: Secondary | ICD-10-CM

## 2020-04-28 DIAGNOSIS — I10 Essential (primary) hypertension: Secondary | ICD-10-CM | POA: Diagnosis not present

## 2020-04-28 DIAGNOSIS — Z950 Presence of cardiac pacemaker: Secondary | ICD-10-CM

## 2020-04-28 NOTE — Patient Instructions (Signed)

## 2020-05-18 ENCOUNTER — Ambulatory Visit (INDEPENDENT_AMBULATORY_CARE_PROVIDER_SITE_OTHER): Payer: Medicare Other | Admitting: *Deleted

## 2020-05-18 DIAGNOSIS — I442 Atrioventricular block, complete: Secondary | ICD-10-CM | POA: Diagnosis not present

## 2020-05-18 LAB — CUP PACEART REMOTE DEVICE CHECK
Date Time Interrogation Session: 20210622063711
Implantable Lead Implant Date: 20200316
Implantable Lead Implant Date: 20200316
Implantable Lead Location: 753859
Implantable Lead Location: 753860
Implantable Lead Model: 377
Implantable Lead Model: 377
Implantable Lead Serial Number: 81099636
Implantable Lead Serial Number: 81165051
Implantable Pulse Generator Implant Date: 20200316
Pulse Gen Model: 407145
Pulse Gen Serial Number: 69557134

## 2020-05-19 NOTE — Progress Notes (Signed)
Remote pacemaker transmission.   

## 2020-07-30 ENCOUNTER — Other Ambulatory Visit: Payer: Self-pay | Admitting: Family Medicine

## 2020-07-30 DIAGNOSIS — M8580 Other specified disorders of bone density and structure, unspecified site: Secondary | ICD-10-CM

## 2020-08-03 ENCOUNTER — Ambulatory Visit (HOSPITAL_COMMUNITY)
Admission: RE | Admit: 2020-08-03 | Discharge: 2020-08-03 | Disposition: A | Discharge status: Home / Self Care | Payer: Medicare Other | Source: Ambulatory Visit | Attending: Cardiovascular Disease | Admitting: Cardiovascular Disease

## 2020-08-03 ENCOUNTER — Other Ambulatory Visit: Payer: Self-pay

## 2020-08-03 ENCOUNTER — Other Ambulatory Visit: Payer: Self-pay | Admitting: *Deleted

## 2020-08-03 DIAGNOSIS — I6523 Occlusion and stenosis of bilateral carotid arteries: Secondary | ICD-10-CM | POA: Diagnosis present

## 2020-08-05 ENCOUNTER — Telehealth: Payer: Self-pay | Admitting: Interventional Cardiology

## 2020-08-05 NOTE — Telephone Encounter (Signed)
Spoke with pt and made her aware that results have been sent to PCP.  Pt appreciative for call.

## 2020-08-05 NOTE — Telephone Encounter (Signed)
    Pt is calling, she said she received a call from [facility] about her carotid result and was told need her permission to send copy to her pcp. She wasn't sure if verbal permission is ok or she need to go to the office to sign a waiver

## 2020-08-18 ENCOUNTER — Ambulatory Visit (INDEPENDENT_AMBULATORY_CARE_PROVIDER_SITE_OTHER): Payer: Medicare Other | Admitting: *Deleted

## 2020-08-18 DIAGNOSIS — I442 Atrioventricular block, complete: Secondary | ICD-10-CM

## 2020-08-18 LAB — CUP PACEART REMOTE DEVICE CHECK
Date Time Interrogation Session: 20210922070204
Implantable Lead Implant Date: 20200316
Implantable Lead Implant Date: 20200316
Implantable Lead Location: 753859
Implantable Lead Location: 753860
Implantable Lead Model: 377
Implantable Lead Model: 377
Implantable Lead Serial Number: 81099636
Implantable Lead Serial Number: 81165051
Implantable Pulse Generator Implant Date: 20200316
Pulse Gen Model: 407145
Pulse Gen Serial Number: 69557134

## 2020-08-20 NOTE — Progress Notes (Signed)
Remote pacemaker transmission.   

## 2020-10-14 ENCOUNTER — Emergency Department (HOSPITAL_COMMUNITY): Payer: Medicare Other

## 2020-10-14 ENCOUNTER — Encounter (HOSPITAL_COMMUNITY): Payer: Self-pay

## 2020-10-14 ENCOUNTER — Other Ambulatory Visit: Payer: Self-pay

## 2020-10-14 ENCOUNTER — Inpatient Hospital Stay (HOSPITAL_COMMUNITY)
Admission: EM | Admit: 2020-10-14 | Discharge: 2020-10-16 | DRG: 871 | Disposition: A | Discharge status: Home / Self Care | Payer: Medicare Other | Attending: Internal Medicine | Admitting: Internal Medicine

## 2020-10-14 DIAGNOSIS — N183 Chronic kidney disease, stage 3 unspecified: Secondary | ICD-10-CM

## 2020-10-14 DIAGNOSIS — Z881 Allergy status to other antibiotic agents status: Secondary | ICD-10-CM | POA: Diagnosis not present

## 2020-10-14 DIAGNOSIS — R739 Hyperglycemia, unspecified: Secondary | ICD-10-CM | POA: Diagnosis not present

## 2020-10-14 DIAGNOSIS — Z96611 Presence of right artificial shoulder joint: Secondary | ICD-10-CM | POA: Diagnosis present

## 2020-10-14 DIAGNOSIS — E1065 Type 1 diabetes mellitus with hyperglycemia: Secondary | ICD-10-CM | POA: Diagnosis present

## 2020-10-14 DIAGNOSIS — J45909 Unspecified asthma, uncomplicated: Secondary | ICD-10-CM | POA: Diagnosis present

## 2020-10-14 DIAGNOSIS — Z888 Allergy status to other drugs, medicaments and biological substances status: Secondary | ICD-10-CM

## 2020-10-14 DIAGNOSIS — I129 Hypertensive chronic kidney disease with stage 1 through stage 4 chronic kidney disease, or unspecified chronic kidney disease: Secondary | ICD-10-CM | POA: Diagnosis present

## 2020-10-14 DIAGNOSIS — Z794 Long term (current) use of insulin: Secondary | ICD-10-CM | POA: Diagnosis not present

## 2020-10-14 DIAGNOSIS — Z20822 Contact with and (suspected) exposure to covid-19: Secondary | ICD-10-CM | POA: Diagnosis present

## 2020-10-14 DIAGNOSIS — Z8249 Family history of ischemic heart disease and other diseases of the circulatory system: Secondary | ICD-10-CM | POA: Diagnosis not present

## 2020-10-14 DIAGNOSIS — Z79899 Other long term (current) drug therapy: Secondary | ICD-10-CM

## 2020-10-14 DIAGNOSIS — E785 Hyperlipidemia, unspecified: Secondary | ICD-10-CM | POA: Diagnosis present

## 2020-10-14 DIAGNOSIS — R6520 Severe sepsis without septic shock: Secondary | ICD-10-CM | POA: Diagnosis present

## 2020-10-14 DIAGNOSIS — J019 Acute sinusitis, unspecified: Secondary | ICD-10-CM | POA: Diagnosis present

## 2020-10-14 DIAGNOSIS — E1022 Type 1 diabetes mellitus with diabetic chronic kidney disease: Secondary | ICD-10-CM | POA: Diagnosis present

## 2020-10-14 DIAGNOSIS — A419 Sepsis, unspecified organism: Principal | ICD-10-CM

## 2020-10-14 DIAGNOSIS — Z8673 Personal history of transient ischemic attack (TIA), and cerebral infarction without residual deficits: Secondary | ICD-10-CM | POA: Diagnosis not present

## 2020-10-14 DIAGNOSIS — J324 Chronic pansinusitis: Secondary | ICD-10-CM | POA: Diagnosis present

## 2020-10-14 DIAGNOSIS — G9341 Metabolic encephalopathy: Secondary | ICD-10-CM

## 2020-10-14 DIAGNOSIS — M810 Age-related osteoporosis without current pathological fracture: Secondary | ICD-10-CM | POA: Diagnosis present

## 2020-10-14 DIAGNOSIS — H409 Unspecified glaucoma: Secondary | ICD-10-CM | POA: Diagnosis present

## 2020-10-14 DIAGNOSIS — N181 Chronic kidney disease, stage 1: Secondary | ICD-10-CM | POA: Diagnosis not present

## 2020-10-14 DIAGNOSIS — N1831 Chronic kidney disease, stage 3a: Secondary | ICD-10-CM | POA: Diagnosis present

## 2020-10-14 DIAGNOSIS — J0141 Acute recurrent pansinusitis: Secondary | ICD-10-CM | POA: Diagnosis present

## 2020-10-14 LAB — CBC WITH DIFFERENTIAL/PLATELET
Abs Immature Granulocytes: 0.04 10*3/uL (ref 0.00–0.07)
Basophils Absolute: 0 10*3/uL (ref 0.0–0.1)
Basophils Relative: 0 %
Eosinophils Absolute: 0.1 10*3/uL (ref 0.0–0.5)
Eosinophils Relative: 1 %
HCT: 32.7 % — ABNORMAL LOW (ref 36.0–46.0)
Hemoglobin: 10.7 g/dL — ABNORMAL LOW (ref 12.0–15.0)
Immature Granulocytes: 0 %
Lymphocytes Relative: 8 %
Lymphs Abs: 1 10*3/uL (ref 0.7–4.0)
MCH: 28.5 pg (ref 26.0–34.0)
MCHC: 32.7 g/dL (ref 30.0–36.0)
MCV: 87 fL (ref 80.0–100.0)
Monocytes Absolute: 1.1 10*3/uL — ABNORMAL HIGH (ref 0.1–1.0)
Monocytes Relative: 9 %
Neutro Abs: 10.4 10*3/uL — ABNORMAL HIGH (ref 1.7–7.7)
Neutrophils Relative %: 82 %
Platelets: 376 10*3/uL (ref 150–400)
RBC: 3.76 MIL/uL — ABNORMAL LOW (ref 3.87–5.11)
RDW: 13.6 % (ref 11.5–15.5)
WBC: 12.6 10*3/uL — ABNORMAL HIGH (ref 4.0–10.5)
nRBC: 0 % (ref 0.0–0.2)

## 2020-10-14 LAB — CBG MONITORING, ED
Glucose-Capillary: 360 mg/dL — ABNORMAL HIGH (ref 70–99)
Glucose-Capillary: 341 mg/dL — ABNORMAL HIGH (ref 70–99)

## 2020-10-14 LAB — HEMOGLOBIN A1C
Hgb A1c MFr Bld: 9.4 % — ABNORMAL HIGH (ref 4.8–5.6)
Mean Plasma Glucose: 223.08 mg/dL

## 2020-10-14 LAB — COMPREHENSIVE METABOLIC PANEL
ALT: 16 U/L (ref 0–44)
AST: 21 U/L (ref 15–41)
Albumin: 2.7 g/dL — ABNORMAL LOW (ref 3.5–5.0)
Alkaline Phosphatase: 167 U/L — ABNORMAL HIGH (ref 38–126)
Anion gap: 12 (ref 5–15)
BUN: 19 mg/dL (ref 8–23)
CO2: 31 mmol/L (ref 22–32)
Calcium: 8.4 mg/dL — ABNORMAL LOW (ref 8.9–10.3)
Chloride: 91 mmol/L — ABNORMAL LOW (ref 98–111)
Creatinine, Ser: 0.89 mg/dL (ref 0.44–1.00)
GFR, Estimated: >60 mL/min (ref >60)
Glucose, Bld: 384 mg/dL — ABNORMAL HIGH (ref 70–99)
Potassium: 4.4 mmol/L (ref 3.5–5.1)
Sodium: 134 mmol/L — ABNORMAL LOW (ref 135–145)
Total Bilirubin: 0.4 mg/dL (ref 0.3–1.2)
Total Protein: 7.3 g/dL (ref 6.5–8.1)

## 2020-10-14 LAB — RESP PANEL BY RT-PCR (FLU A&B, COVID) ARPGX2
Influenza A by PCR: NEGATIVE
Influenza B by PCR: NEGATIVE
SARS Coronavirus 2 by RT PCR: NEGATIVE

## 2020-10-14 LAB — GLUCOSE, CAPILLARY
Glucose-Capillary: 116 mg/dL — ABNORMAL HIGH (ref 70–99)
Glucose-Capillary: 234 mg/dL — ABNORMAL HIGH (ref 70–99)
Glucose-Capillary: 306 mg/dL — ABNORMAL HIGH (ref 70–99)

## 2020-10-14 LAB — SEDIMENTATION RATE: Sed Rate: 118 mm/hr — ABNORMAL HIGH (ref 0–22)

## 2020-10-14 LAB — TROPONIN I (HIGH SENSITIVITY)
Troponin I (High Sensitivity): 7 ng/L (ref <18)
Troponin I (High Sensitivity): 6 ng/L (ref <18)

## 2020-10-14 LAB — LIPASE, BLOOD: Lipase: 23 U/L (ref 11–51)

## 2020-10-14 MED ORDER — ACETAMINOPHEN 325 MG PO TABS
650.0000 mg | ORAL_TABLET | Freq: Once | ORAL | Status: AC
  Administered 2020-10-14: 650 mg via ORAL
  Filled 2020-10-14: qty 2

## 2020-10-14 MED ORDER — SIMVASTATIN 40 MG PO TABS
40.0000 mg | ORAL_TABLET | Freq: Every day | ORAL | Status: DC
  Administered 2020-10-14 – 2020-10-16 (×3): 40 mg via ORAL
  Filled 2020-10-14 (×3): qty 1

## 2020-10-14 MED ORDER — ACETAMINOPHEN 650 MG RE SUPP: 650.0000 mg | Freq: Four times a day (QID) | RECTAL | Status: DC | PRN

## 2020-10-14 MED ORDER — SODIUM CHLORIDE 0.9 % IV SOLN: INTRAVENOUS | Status: AC

## 2020-10-14 MED ORDER — INSULIN DETEMIR 100 UNIT/ML ~~LOC~~ SOLN
8.0000 [IU] | Freq: Two times a day (BID) | SUBCUTANEOUS | Status: DC
  Filled 2020-10-14 (×3): qty 0.08

## 2020-10-14 MED ORDER — INSULIN ASPART 100 UNIT/ML ~~LOC~~ SOLN: 0.0000 [IU] | Freq: Every day | SUBCUTANEOUS | Status: DC

## 2020-10-14 MED ORDER — POLYETHYLENE GLYCOL 3350 17 G PO PACK: 17.0000 g | PACK | Freq: Every day | ORAL | Status: DC | PRN

## 2020-10-14 MED ORDER — LEVOFLOXACIN IN D5W 750 MG/150ML IV SOLN
750.0000 mg | INTRAVENOUS | Status: DC
  Administered 2020-10-16: 750 mg via INTRAVENOUS
  Filled 2020-10-14: qty 150

## 2020-10-14 MED ORDER — LEVOFLOXACIN IN D5W 750 MG/150ML IV SOLN
750.0000 mg | Freq: Once | INTRAVENOUS | Status: AC
  Administered 2020-10-14: 750 mg via INTRAVENOUS
  Filled 2020-10-14: qty 150

## 2020-10-14 MED ORDER — ENOXAPARIN SODIUM 40 MG/0.4ML ~~LOC~~ SOLN
40.0000 mg | SUBCUTANEOUS | Status: DC
  Administered 2020-10-14 – 2020-10-15 (×2): 40 mg via SUBCUTANEOUS
  Filled 2020-10-14 (×2): qty 0.4

## 2020-10-14 MED ORDER — INSULIN ASPART 100 UNIT/ML ~~LOC~~ SOLN
4.0000 [IU] | Freq: Three times a day (TID) | SUBCUTANEOUS | Status: DC
  Administered 2020-10-15 – 2020-10-16 (×4): 4 [IU] via SUBCUTANEOUS

## 2020-10-14 MED ORDER — SODIUM CHLORIDE 0.9 % IV SOLN: INTRAVENOUS | Status: DC

## 2020-10-14 MED ORDER — ONDANSETRON HCL 4 MG PO TABS: 4.0000 mg | ORAL_TABLET | Freq: Four times a day (QID) | ORAL | Status: DC | PRN

## 2020-10-14 MED ORDER — INSULIN ASPART 100 UNIT/ML ~~LOC~~ SOLN
0.0000 [IU] | Freq: Three times a day (TID) | SUBCUTANEOUS | Status: DC
  Administered 2020-10-14 – 2020-10-15 (×2): 11 [IU] via SUBCUTANEOUS
  Administered 2020-10-15: 5 [IU] via SUBCUTANEOUS
  Administered 2020-10-16: 11 [IU] via SUBCUTANEOUS

## 2020-10-14 MED ORDER — SODIUM CHLORIDE 0.9 % IV BOLUS
500.0000 mL | Freq: Once | INTRAVENOUS | Status: AC
  Administered 2020-10-14: 500 mL via INTRAVENOUS

## 2020-10-14 MED ORDER — ACETAMINOPHEN 325 MG PO TABS
650.0000 mg | ORAL_TABLET | Freq: Four times a day (QID) | ORAL | Status: DC | PRN
  Administered 2020-10-14 – 2020-10-15 (×3): 650 mg via ORAL
  Filled 2020-10-14 (×3): qty 2

## 2020-10-14 MED ORDER — ONDANSETRON HCL 4 MG/2ML IJ SOLN
4.0000 mg | Freq: Four times a day (QID) | INTRAMUSCULAR | Status: DC | PRN
  Administered 2020-10-15: 4 mg via INTRAVENOUS
  Filled 2020-10-14: qty 2

## 2020-10-14 MED ORDER — LEVOFLOXACIN IN D5W 500 MG/100ML IV SOLN: 500.0000 mg | INTRAVENOUS | Status: DC

## 2020-10-14 NOTE — ED Provider Notes (Signed)
Care of the patient assumed at the change of shift pending further evaluation of abnormal findings on head CT. MRI cannot do study today due to pacemaker and time limitations for having staff in place to do the steps required to deactivate the device. Neurology feels that the patient's symptoms are due to sinusitis and not due to GCA.   4:15 PM Spoke with Dr. Jenne Pane, ENT who reviewed CT images. Did not recommend any additional imaging, agrees with Levaquin for treatment of her sinusitis (multiple allergies) and will be happy to see her in hospital follow up. Will discuss again with Hospitalist.    Pollyann Savoy, MD 10/14/20 2026

## 2020-10-14 NOTE — ED Notes (Signed)
PT transported upstairs by tech

## 2020-10-14 NOTE — ED Provider Notes (Signed)
[facility] DEPT Provider Note   CSN: [id number] Arrival date & time: 10/14/20  1155     History Chief Complaint  Patient presents with  . Hyperglycemia  . Back Pain  . Headache    Danielle Clarke is a 79 y.o. female.  She is a rather poor historian.  Complaining of headache that is been going on for 4 to 6 weeks.  She said she is been on a course of antibiotics for sinus infection.  Still has nasal congestion and pain.  She is also complaining of low back pain which sounds chronic for her.  Worse with bending and turning.  Today her sugars were elevated and she says they usually are if she has pain.  She felt generally weak and called the ambulance.  No chest pain cough or shortness of breath.  No abdominal pain vomiting or diarrhea.  No urinary symptoms.  She has been Covid vaccinated.  The history is provided by the patient.  Back Pain Location:  Lumbar spine Quality:  Aching Pain severity:  Moderate Pain is:  Same all the time Onset quality:  Gradual Timing:  Intermittent Chronicity:  Chronic Worsened by:  Ambulation and bending Ineffective treatments:  None tried Associated symptoms: headaches   Associated symptoms: no abdominal pain, no chest pain, no dysuria, no fever, no numbness and no weakness   Headache Pain location:  Frontal Onset quality:  Gradual Timing:  Intermittent Progression:  Unchanged Chronicity:  Recurrent Ineffective treatments:  Prescription medications Associated symptoms: back pain, cough, fatigue and sinus pressure   Associated symptoms: no abdominal pain, no diarrhea, no fever, no focal weakness, no numbness, no sore throat, no visual change and no weakness        Past Medical History:  Diagnosis Date  . Aortic stenosis, mild   . Arthritis   . Asthmatic bronchitis    with colds per patient  . Carotid artery disease (HCC)    40-59% bilateral ICA stenosis  . Cataract   . Cutaneous abscess of right foot    . Glaucoma   . Heart murmur    Dr Tamala Julian is her  cardiologist.   . Hyperlipemia   . Hypertension    Dr. Tamala Julian ~ 2 years ago  . Neck fracture [facility])    july 2013  . Osteoporosis   . Syncope   . Type 1 diabetes mellitus [facility])     Patient Active Problem List   Diagnosis Date Noted  . Abnormality of gait 12/01/2019  . 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 Traumatic brain injury with loss of consciousness ([place]) 05/28/2019  . Heart block AV complete ([place]) 05/20/2019  . Hypertensive crisis   . Hypokalemia   . Leukocytosis   . Lethargy   . Labile blood pressure   . 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 Acute blood loss anemia   . Low grade fever   . Hypoglycemia   . Multiple closed fractures of ribs of right side   . Pleural effusion on right   . Hypoalbuminemia due to protein-calorie malnutrition ([place])   . Dysphagia   . Traumatic cerebral intraparenchymal hematoma ([place]) 02/14/2019  . Pacemaker   . Labile blood glucose   . Diabetes mellitus type 2 in nonobese (HCC)   . Dyslipidemia   . Essential hypertension   . Hypernatremia   . Anemia of chronic disease   . Hypertensive urgency 02/07/2019  .  Intracerebral hemorrhage ([place]) 02/07/2019  . Intracranial bleeding ([place]) 02/06/2019  . Elevated troponin   . Abnormal EKG   . Bronchitis 08/05/2017  . Bilateral carotid artery stenosis   . Aortic stenosis 08/25/2015  . Cervical stenosis of spinal canal 07/31/2014  . CKD (chronic kidney disease) stage 2, GFR 60-89 ml/min 12/30/2013  . Essential hypertension, benign 12/30/2013  . Hyperlipidemia 12/30/2013  . Toe infection 12/30/2013  . Anemia 12/30/2013  . Acute renal failure ([place]) 12/29/2013  . Syncope 01/02/2013  . Diabetes mellitus type 1 ([place]) 01/02/2013  . Bilateral carotid artery disease ([place]) 01/02/2013  . GOITER, MULTINODULAR 02/11/2008    Past Surgical History:  Procedure Laterality Date  . ANKLE  FRACTURE SURGERY Left 2001   steel plate and 3 screws   . ANTERIOR CERVICAL CORPECTOMY N/A 07/31/2014   Procedure: Cervical Four to Cervical Six Corpectomy;  Surgeon: Floyce Stakes, MD;  Location: [facility];  Service: Neurosurgery;  Laterality: N/A;  C4 to C6 Corpectomy  . BREAST SURGERY  1988  . CATARACT EXTRACTION W/ INTRAOCULAR LENS  IMPLANT, BILATERAL Bilateral 2011   right and then left  . EYE SURGERY    . FRACTURE SURGERY    . [place]  . I & D EXTREMITY Right 09/27/2018   Procedure: IRRIGATION AND DEBRIDEMENT RIGHT FOOT;  Surgeon: Newt Minion, MD;  Location: [facility];  Service: Orthopedics;  Laterality: Right;  . JOINT REPLACEMENT    . PACEMAKER IMPLANT N/A 02/10/2019   Procedure: PACEMAKER IMPLANT;  Surgeon: Evans Lance, MD;  Location: [facility];  Service: Cardiovascular;  Laterality: N/A;  . TONSILLECTOMY AND ADENOIDECTOMY  1948  . TOTAL SHOULDER REPLACEMENT  2010   right shoulder   . TUBAL LIGATION  1979  . TYMPANOSTOMY TUBE PLACEMENT Bilateral      OB History    Gravida      Para      Term      Preterm      AB      Living  2     SAB      TAB      Ectopic      Multiple      Live Births              Family History  Problem Relation Age of Onset  . Cancer Mother   . Heart Problems Father     Social History   Tobacco Use  . Smoking status: Never Smoker  . Smokeless tobacco: Never Used  Vaping Use  . Vaping Use: Never used  Substance Use Topics  . Alcohol use: Yes    Alcohol/week: 2.0 standard drinks    Types: 2 Glasses of wine per week  . Drug use: Never    Home Medications Prior to Admission medications   Medication Sig Start Date End Date Taking? Authorizing Provider  acetaminophen (TYLENOL) 325 MG tablet Take 2 tablets (650 mg total) by mouth every 6 (six) hours as needed for mild pain, fever or headache. 02/28/19   Angiulli, Lavon Paganini, PA-C  Cyanocobalamin (VITAMIN B-12 PO) Take 1 tablet by mouth daily.     [provider]  HUMALOG KWIKPEN 100 UNIT/ML KwikPen INJECT 10 TO 25 UNITS SUBCUTANEOUSLY THREE TIMES DAILY 04/08/19   [provider]  Insulin Glargine (LANTUS) 100 UNIT/ML Solostar Pen Inject 13 Units into the skin daily. 02/28/19   Angiulli, Lavon Paganini, PA-C  lidocaine (LIDODERM) 5 % Place 1 patch  onto the skin daily. Remove & Discard patch within 12 hours or as directed by MD 02/28/19   Angiulli, Lavon Paganini, PA-C  simvastatin (ZOCOR) 40 MG tablet Take 40 mg by mouth daily. 03/20/20   [provider]  VITAMIN D PO Take 1 tablet by mouth daily.    [provider]    Allergies    Cefaclor, Tetracycline, Vancomycin, Augmentin [amoxicillin-pot clavulanate], and Prednisone  Review of Systems   Review of Systems  Constitutional: Positive for fatigue. Negative for fever.  HENT: Positive for sinus pressure and sinus pain. Negative for sore throat.   Eyes: Negative for visual disturbance.  Respiratory: Positive for cough. Negative for shortness of breath.   Cardiovascular: Negative for chest pain.  Gastrointestinal: Negative for abdominal pain and diarrhea.  Genitourinary: Negative for dysuria.  Musculoskeletal: Positive for back pain.  Skin: Negative for rash.  Neurological: Positive for headaches. Negative for focal weakness, weakness and numbness.    Physical Exam Updated Vital Signs BP (!) 144/63 (BP Location: Right Arm)   Pulse 96   Temp 98.3 F (36.8 C) (Oral)   Resp 18   SpO2 100%   Physical Exam Vitals and nursing note reviewed.  Constitutional:      General: She is not in acute distress.    Appearance: She is well-developed.  HENT:     Head: Normocephalic and atraumatic.     Mouth/Throat:     Mouth: Mucous membranes are moist.     Pharynx: Oropharynx is clear.  Eyes:     Conjunctiva/sclera: Conjunctivae normal.  Cardiovascular:     Rate and Rhythm: Normal rate and regular rhythm.     Heart sounds: Murmur heard.   Pulmonary:     Effort:  Pulmonary effort is normal. No respiratory distress.     Breath sounds: Normal breath sounds.  Abdominal:     Palpations: Abdomen is soft.     Tenderness: There is no abdominal tenderness.  Musculoskeletal:     Cervical back: Neck supple.  Skin:    General: Skin is warm and dry.     Capillary Refill: Capillary refill takes less than 2 seconds.  Neurological:     Mental Status: She is alert.     GCS: GCS eye subscore is 4. GCS verbal subscore is 5. GCS motor subscore is 6.     Cranial Nerves: No cranial nerve deficit.     Sensory: No sensory deficit.     Motor: No weakness.     ED Results / Procedures / Treatments   Labs (all labs ordered are listed, but only abnormal results are displayed) Labs Reviewed  COMPREHENSIVE METABOLIC PANEL - Abnormal; Notable for the following components:      Result Value   Sodium 134 (*)    Chloride 91 (*)    Glucose, Bld 384 (*)    Calcium 8.4 (*)    Albumin 2.7 (*)    Alkaline Phosphatase 167 (*)    All other components within normal limits  CBC WITH DIFFERENTIAL/PLATELET - Abnormal; Notable for the following components:   WBC 12.6 (*)    RBC 3.76 (*)    Hemoglobin 10.7 (*)    HCT 32.7 (*)    Neutro Abs 10.4 (*)    Monocytes Absolute 1.1 (*)    All other components within normal limits  URINALYSIS, ROUTINE W REFLEX MICROSCOPIC - Abnormal; Notable for the following components:   Glucose, UA >=500 (*)    Ketones, ur 5 (*)  All other components within normal limits  SEDIMENTATION RATE - Abnormal; Notable for the following components:   Sed Rate 118 (*)    All other components within normal limits  HEMOGLOBIN A1C - Abnormal; Notable for the following components:   Hgb A1c MFr Bld 9.4 (*)    All other components within normal limits  BASIC METABOLIC PANEL - Abnormal; Notable for the following components:   Sodium 133 (*)    Chloride 97 (*)    Glucose, Bld 334 (*)    Calcium 8.0 (*)    All other components within normal limits  CBC -  Abnormal; Notable for the following components:   WBC 12.2 (*)    RBC 3.66 (*)    Hemoglobin 10.1 (*)    HCT 32.3 (*)    All other components within normal limits  GLUCOSE, CAPILLARY - Abnormal; Notable for the following components:   Glucose-Capillary 306 (*)    All other components within normal limits  GLUCOSE, CAPILLARY - Abnormal; Notable for the following components:   Glucose-Capillary 116 (*)    All other components within normal limits  GLUCOSE, CAPILLARY - Abnormal; Notable for the following components:   Glucose-Capillary 234 (*)    All other components within normal limits  GLUCOSE, CAPILLARY - Abnormal; Notable for the following components:   Glucose-Capillary 344 (*)    All other components within normal limits  CBG MONITORING, ED - Abnormal; Notable for the following components:   Glucose-Capillary 360 (*)    All other components within normal limits  CBG MONITORING, ED - Abnormal; Notable for the following components:   Glucose-Capillary 341 (*)    All other components within normal limits  RESP PANEL BY RT-PCR (FLU A&B, COVID) [id number]  URINE CULTURE  LIPASE, BLOOD  TROPONIN I (HIGH SENSITIVITY)  TROPONIN I (HIGH SENSITIVITY)    EKG EKG Interpretation  Date/Time:  Thursday October 14 2020 12:47:10 EST Ventricular Rate:  95 PR Interval:    QRS Duration: 105 QT Interval:  395 QTC Calculation: 497 R Axis:   75 Text Interpretation: Sinus rhythm Anterior infarct, old Nonspecific T abnormalities, lateral leads No significant change since prior 3/20 Confirmed by Aletta Edouard [phone]) on 10/14/2020 12:54:53 PM   Radiology CT Head Wo Contrast  Result Date: 10/14/2020 CLINICAL DATA:  Cerebral hemorrhage suspected additional history provided: Patient reports headache and back pain for 1 week. EXAM: CT HEAD WITHOUT CONTRAST TECHNIQUE: Contiguous axial images were obtained from the base of the skull through the vertex without intravenous contrast. COMPARISON:   Prior head CT examinations 03/27/2019 and earlier. FINDINGS: Brain: Chronic encephalomalacia within the anterior left frontal lobe and anterior temporal lobes at sites of prior hemorrhagic parenchymal contusions. Background mild cerebral atrophy. There is no acute intracranial hemorrhage. No demarcated cortical infarct. No extra-axial fluid collection. No evidence of intracranial mass. No midline shift. Vascular: No hyperdense vessel.  Atherosclerotic calcifications Skull: No calvarial fracture. Sinuses/Orbits: New from the prior examination, there is severe pansinusitis with complete or near complete sinus opacification throughout. There is abnormal soft tissue density within the medial aspect of the anterior left orbit, abutting the left lamina papyracea measuring 1.3 x 0.7 cm in transaxial dimension (series 2, image 6) (series 5, image 16). These results were called by telephone at the time of interpretation on 10/14/2020 at 2:55 pm to provider [facility] , who verbally acknowledged these results. IMPRESSION: No CT evidence of acute intracranial abnormality. New from the prior head CT of 03/27/2019, there is severe  pansinusitis with complete or near complete sinus opacification throughout. Given the provided history of headache and back pain, consider CSF analysis if there is concern for a secondary meningitis. Abnormal soft tissue density within the medial aspect of the anterior left orbit, abutting the left lamina papyracea and measuring 1.3 x 0.7 cm. Given the severe adjacent left ethmoid sinusitis, this is suspicious for possible subperiosteal abscess. Contrast-enhanced CT or MRI of the orbits is recommended for further evaluation. Chronic encephalomalacia within the anterior left frontal lobe and anterior temporal lobes at sites of prior hemorrhagic parenchymal contusions. Background mild cerebral atrophy. Electronically Signed   By: Kellie Simmering DO   On: 10/14/2020 14:56   DG Chest Port 1  View  Result Date: 10/14/2020 CLINICAL DATA:  Weakness. EXAM: PORTABLE CHEST 1 VIEW COMPARISON:  02/16/2019 FINDINGS: Normal sized heart. Partially calcified thoracic aorta. Stable left subclavian bipolar pacemaker leads. Clear lungs with normal vascularity. Stable right shoulder prosthesis and cervical spine fixation hardware. Stable mild to moderate dextroconvex scoliosis in the lower thoracic spine. Old, healed right rib fractures. IMPRESSION: No acute abnormality. Electronically Signed   By: Claudie Revering M.D.   On: 10/14/2020 13:31   CT Renal Stone Study  Result Date: 10/14/2020 CLINICAL DATA:  Flank pain EXAM: CT ABDOMEN AND PELVIS WITHOUT CONTRAST TECHNIQUE: Multidetector CT imaging of the abdomen and pelvis was performed following the standard protocol without IV contrast. COMPARISON:  02/06/2019 FINDINGS: Lower chest: No acute abnormality. Hepatobiliary: Unremarkable unenhanced appearance of the liver. No focal liver lesion identified. Gallbladder within normal limits. No hyperdense gallstone. No biliary dilatation. Pancreas: Unremarkable. No pancreatic ductal dilatation or surrounding inflammatory changes. Spleen: Normal in size without focal abnormality. Adrenals/Urinary Tract: Unremarkable adrenal glands. Bilateral kidneys demonstrate a normal noncontrast appearance. No renal stone or hydronephrosis. No ureteral calculus is identified. There are multiple bilateral pelvic phleboliths. Urinary bladder is within normal limits. Stomach/Bowel: Stomach is within normal limits. Appendix appears normal (series 2, image 51). No evidence of bowel wall thickening, distention, or inflammatory changes. Moderate volume of stool within the colon. Vascular/Lymphatic: Aortoiliac atherosclerosis without evidence of aneurysm. No abdominopelvic lymphadenopathy. Reproductive: Anteverted uterus with coarsely calcified fundal fibroid. No adnexal masses. Other: No free fluid. No abdominopelvic fluid collection. No  pneumoperitoneum. No abdominal wall hernia. Musculoskeletal: Lumbar dextrocurvature with severe degenerative disc disease within the lumbar spine, similar to prior. No new or acute osseous findings. IMPRESSION: 1. No acute abdominopelvic findings. Specifically, no evidence of obstructive uropathy. 2. Moderate volume of stool within the colon. 3. Lumbar levocurvature with severe degenerative disc disease. 4. Aortic atherosclerosis. (ICD10-I70.0). Electronically Signed   By: Davina Poke D.O.   On: 10/14/2020 14:50    Procedures Procedures (including critical care time)  Medications Ordered in ED Medications  simvastatin (ZOCOR) tablet 40 mg (40 mg Oral Given 10/14/20 2201)  insulin aspart (novoLOG) injection 0-15 Units (11 Units Subcutaneous Given 10/14/20 1834)  insulin aspart (novoLOG) injection 0-5 Units (0 Units Subcutaneous Not Given 10/14/20 2157)  insulin aspart (novoLOG) injection 4 Units (4 Units Subcutaneous Given 10/15/20 0751)  enoxaparin (LOVENOX) injection 40 mg (40 mg Subcutaneous Given 10/14/20 2201)  0.9 %  sodium chloride infusion ( Intravenous New Bag/Given 10/15/20 0715)  acetaminophen (TYLENOL) tablet 650 mg (650 mg Oral Given 10/14/20 2201)    Or  acetaminophen (TYLENOL) suppository 650 mg ( Rectal See Alternative 10/14/20 2201)  polyethylene glycol (MIRALAX / GLYCOLAX) packet 17 g (has no administration in time range)  ondansetron (ZOFRAN) tablet 4 mg (has no  administration in time range)    Or  ondansetron (ZOFRAN) injection 4 mg (has no administration in time range)  levofloxacin (LEVAQUIN) IVPB 750 mg (has no administration in time range)  insulin glargine (LANTUS) injection 12 Units (has no administration in time range)  sodium chloride 0.9 % bolus 500 mL (0 mLs Intravenous Stopped 10/14/20 1446)  acetaminophen (TYLENOL) tablet 650 mg (650 mg Oral Given 10/14/20 1414)  levofloxacin (LEVAQUIN) IVPB 750 mg (0 mg Intravenous Stopped 10/14/20 1716)  ketorolac  (TORADOL) 15 MG/ML injection 15 mg (15 mg Intravenous Given 10/15/20 1791)    ED Course  I have reviewed the triage vital signs and the nursing notes.  Pertinent labs & imaging results that were available during my care of the patient were reviewed by me and considered in my medical decision making (see chart for details).  Clinical Course as of Oct 15 836  Thu Oct 14, 2020  1337 Chest x-ray interpreted by me as no acute pulmonary disease.   [MB]  1433 Daughter-in-law here now is able to find a little bit more history.  She has had this headache on and off for the last 4 weeks but getting more steady over the past few days.   [MB]  5056 She is tender in both of her temple areas although I do not feel any heard vessels.  Sed rate elevated at 118.   [MB]    Clinical Course User Index [MB] Hayden Rasmussen, MD   MDM Rules/Calculators/A&P                         This patient complains of headache, possible sinus infection, low back pain, elevated blood sugars, generalized weakness; this involves an extensive number of treatment Options and is a complaint that carries with it a high risk of complications and Morbidity. The differential includes intracranial hemorrhage, stroke, tension headache, migraine, temporal arteritis, sinus infection, [place], kidney stone  I ordered, reviewed and interpreted labs, which included CBC with mildly elevated white count, stable hemoglobin, chemistries with elevated blood sugar elevated alk phos, Covid testing negative, troponins flat, sed rate markedly elevated at 118  I ordered medication IV fluids and Tylenol I ordered imaging studies which included CT head and I independently    visualized and interpreted imaging which showed pansinusitis Additional history obtained from patient's granddaughter Previous records obtained and reviewed in epic, no recent admissions I consulted neurology Dr. Curly Sh[place] and discussed lab and imaging findings  Critical  Interventions: None  After the interventions stated above, I reevaluated the patient and found patient to be still symptomatically having a headache.  Blood sugar is elevated.  Complex case, could be temporal arteritis although her sinusitis may account for all of her symptoms and elevated sed rate.  Signed out to oncoming provider Dr. Karlton Lemon to review case with ENT regarding her possible periosteal abscess and other neurology recommendations.  She will need admission to the hospital.   Final Clinical Impression(s) / ED Diagnoses Final diagnoses:  Acute recurrent pansinusitis  Hyperglycemia    Rx / DC Orders ED Discharge Orders    None       Hayden Rasmussen, MD 10/15/20 [phone]

## 2020-10-14 NOTE — H&P (Addendum)
History and Physical  Danielle FordyceJacqueline J Putt HYQ:[id number] DOB: Dec 13, 1940 DOA: 10/14/2020  PCP: Catha GosselinLittle, Kevin, MD Patient coming from: Home  I have personally briefly reviewed patient's old medical records [facility]   Chief Complaint: Hyperglycemia and confusion  HPI: Danielle Clarke is a 79 y.o. female past medical history of CVA, diabetes mellitus type 2 poorly controlled, fully vaccinated against SARS-Cov 2 who has received multiple treatment for acute sinusitis over the past 6 weeks with minimal improvement relates she started having headaches again about 4 weeks prior to admission she just finished her antibiotic treatment which improved slightly her congestion but then it got worse with some facial pain and headache.  She also relates that her sugars been hard to control she has been taking extra insulin at home. She denies any cough, shortness of breath, chest pain or fever. She denies abdominal pain nausea or vomiting.  She has no urinary symptoms.  In the ED: She has remained afebrile with a white count of 12 with a differential with a blood glucose of 300, CT scan of the head showed pansinusitis with a small abscess ENT was curb sided by the ED and recommended no further treatment, neurology was consulted recommended no further imaging.   Review of Systems: All systems reviewed and apart from history of presenting illness, are negative.  Past Medical History:  Diagnosis Date  . Aortic stenosis, mild   . Arthritis   . Asthmatic bronchitis    with colds per patient  . Carotid artery disease (HCC)    40-59% bilateral ICA stenosis  . Cataract   . Cutaneous abscess of right foot   . Glaucoma   . Heart murmur    Dr [name] is her  cardiologist.   . Hyperlipemia   . Hypertension    Dr. [name] ~ 2 years ago  . Neck fracture [facility](HCC)    july 2013  . Osteoporosis   . Syncope   . Type 1 diabetes mellitus (HCC)    Past Surgical History:  Procedure Laterality Date   . ANKLE FRACTURE SURGERY Left 2001   steel plate and 3 screws   . ANTERIOR CERVICAL CORPECTOMY N/A 07/31/2014   Procedure: Cervical Four to Cervical Six Corpectomy;  Surgeon: Karn CassisErnesto M Botero, MD;  Location: [facility] OR[facility];  Service: Neurosurgery;  Laterality: N/A;  C4 to C6 Corpectomy  . BREAST SURGERY  1988  . CATARACT EXTRACTION W/ INTRAOCULAR LENS  IMPLANT, BILATERAL Bilateral 2011   right and then left  . EYE SURGERY    . FRACTURE SURGERY    . HAMMER TOE SURGERY  1998  . I & D EXTREMITY Right 09/27/2018   Procedure: IRRIGATION AND DEBRIDEMENT RIGHT FOOT;  Surgeon: Nadara Mustarduda, Marcus V, MD;  [facility];  Service: Orthopedics;  Laterality: Right;  . JOINT REPLACEMENT    . PACEMAKER IMPLANT N/A 02/10/2019   Procedure: PACEMAKER IMPLANT;  Surgeon: Marinus Mawaylor, Gregg W, MD;  [facility];  Service: Cardiovascular;  Laterality: N/A;  . TONSILLECTOMY AND ADENOIDECTOMY  1948  . TOTAL SHOULDER REPLACEMENT  2010   right shoulder   . TUBAL LIGATION  1979  . TYMPANOSTOMY TUBE PLACEMENT Bilateral    Social History:  reports that she has never smoked. She has never used smokeless tobacco. She reports current alcohol use of about 2.0 standard drinks of alcohol per week. She reports that she does not use drugs.   Allergies  Allergen Reactions  . Cefaclor Anaphylaxis  .  Tetracycline Anaphylaxis  . Vancomycin Anaphylaxis  . Augmentin [Amoxicillin-Pot Clavulanate] Other (See Comments)    Reaction unknown >> Anaphylaxis Has patient had a PCN reaction causing immediate rash, facial/tongue/throat swelling, SOB or lightheadedness with hypotension: Unknown Has patient had a PCN reaction causing severe rash involving mucus membranes or skin necrosis: Unknown Has patient had a PCN reaction that required hospitalization: Unknown Has patient had a PCN reaction occurring within the last 10 years: Unknown If all of the above answers are "NO", then may proceed with Cephalosporin use.   . Prednisone  Other (See Comments)    Reaction unknown    Family History  Problem Relation Age of Onset  . Cancer Mother   . Heart Problems Father     Prior to Admission medications   Medication Sig Start Date End Date Taking? Authorizing Provider  acetaminophen (TYLENOL) 325 MG tablet Take 2 tablets (650 mg total) by mouth every 6 (six) hours as needed for mild pain, fever or headache. 02/28/19   Angiulli, Mcarthur Rossetti, PA-C  Cyanocobalamin (VITAMIN B-12 PO) Take 1 tablet by mouth daily.    [provider]  HUMALOG KWIKPEN 100 UNIT/ML KwikPen INJECT 10 TO 25 UNITS SUBCUTANEOUSLY THREE TIMES DAILY 04/08/19   [provider]  Insulin Glargine (LANTUS) 100 UNIT/ML Solostar Pen Inject 13 Units into the skin daily. 02/28/19   Angiulli, Mcarthur Rossetti, PA-C  lidocaine (LIDODERM) 5 % Place 1 patch onto the skin daily. Remove & Discard patch within 12 hours or as directed by MD 02/28/19   Angiulli, Mcarthur Rossetti, PA-C  simvastatin (ZOCOR) 40 MG tablet Take 40 mg by mouth daily. 03/20/20   [provider]  VITAMIN D PO Take 1 tablet by mouth daily.    [provider]   Physical Exam: Vitals:   10/14/20 1545 10/14/20 1600 10/14/20 1615 10/14/20 1630  BP:  (!) 117/48    Pulse: 95 96 94 97  Resp: (!) 25 12 18 17   Temp:      TempSrc:      SpO2: 98% 99% 97% 97%     General exam: Moderately built and nourished patient, lying comfortably supine on the gurney in no obvious distress.  Head, eyes and ENT: Nontraumatic and normocephalic.   Neck: Supple. No JVD, carotid bruit or thyromegaly.  Lymphatics: No lymphadenopathy.  Respiratory system: Clear to auscultation. No increased work of breathing.  Cardiovascular system: S1 and S2 heard, RRR. No JVD, murmurs, gallops, clicks or pedal edema.  Gastrointestinal system: Abdomen is nondistended, soft and nontender. Normal bowel sounds heard. No organomegaly or masses appreciated.  Extremities: Symmetric 5 x 5 power. Peripheral pulses  symmetrically felt.   Skin: No rashes or acute findings.  Musculoskeletal system: Negative exam.  Psychiatry: Pleasant and cooperative.   Labs on Admission:  Basic Metabolic Panel: Recent Labs  Lab 10/14/20 1300  NA 134*  K 4.4  CL 91*  CO2 31  GLUCOSE 384*  BUN 19  CREATININE 0.89  CALCIUM 8.4*   Liver Function Tests: Recent Labs  Lab 10/14/20 1300  AST 21  ALT 16  ALKPHOS 167*  BILITOT 0.4  PROT 7.3  ALBUMIN 2.7*   Recent Labs  Lab 10/14/20 1300  LIPASE 23   No results for input(s): AMMONIA in the last 168 hours. CBC: Recent Labs  Lab 10/14/20 1300  WBC 12.6*  NEUTROABS 10.4*  HGB 10.7*  HCT 32.7*  MCV 87.0  PLT 376   Cardiac Enzymes: No results for input(s): CKTOTAL, CKMB, CKMBINDEX,  TROPONINI in the last 168 hours.  BNP (last 3 results) No results for input(s): PROBNP in the last 8760 hours. CBG: Recent Labs  Lab 10/14/20 1303 10/14/20 1449  GLUCAP 360* 341*    Radiological Exams on Admission: CT Head Wo Contrast  Result Date: 10/14/2020 CLINICAL DATA:  Cerebral hemorrhage suspected additional history provided: Patient reports headache and back pain for 1 week. EXAM: CT HEAD WITHOUT CONTRAST TECHNIQUE: Contiguous axial images were obtained from the base of the skull through the vertex without intravenous contrast. COMPARISON:  Prior head CT examinations 03/27/2019 and earlier. FINDINGS: Brain: Chronic encephalomalacia within the anterior left frontal lobe and anterior temporal lobes at sites of prior hemorrhagic parenchymal contusions. Background mild cerebral atrophy. There is no acute intracranial hemorrhage. No demarcated cortical infarct. No extra-axial fluid collection. No evidence of intracranial mass. No midline shift. Vascular: No hyperdense vessel.  Atherosclerotic calcifications Skull: No calvarial fracture. Sinuses/Orbits: New from the prior examination, there is severe pansinusitis with complete or near complete sinus opacification  throughout. There is abnormal soft tissue density within the medial aspect of the anterior left orbit, abutting the left lamina papyracea measuring 1.3 x 0.7 cm in transaxial dimension (series 2, image 6) (series 5, image 16). These results were called by telephone at the time of interpretation on 10/14/2020 at 2:55 pm to provider [facility] , who verbally acknowledged these results. IMPRESSION: No CT evidence of acute intracranial abnormality. New from the prior head CT of 03/27/2019, there is severe pansinusitis with complete or near complete sinus opacification throughout. Given the provided history of headache and back pain, consider CSF analysis if there is concern for a secondary meningitis. Abnormal soft tissue density within the medial aspect of the anterior left orbit, abutting the left lamina papyracea and measuring 1.3 x 0.7 cm. Given the severe adjacent left ethmoid sinusitis, this is suspicious for possible subperiosteal abscess. Contrast-enhanced CT or MRI of the orbits is recommended for further evaluation. Chronic encephalomalacia within the anterior left frontal lobe and anterior temporal lobes at sites of prior hemorrhagic parenchymal contusions. Background mild cerebral atrophy. Electronically Signed   By: Jackey Loge DO   On: 10/14/2020 14:56   DG Chest Port 1 View  Result Date: 10/14/2020 CLINICAL DATA:  Weakness. EXAM: PORTABLE CHEST 1 VIEW COMPARISON:  02/16/2019 FINDINGS: Normal sized heart. Partially calcified thoracic aorta. Stable left subclavian bipolar pacemaker leads. Clear lungs with normal vascularity. Stable right shoulder prosthesis and cervical spine fixation hardware. Stable mild to moderate dextroconvex scoliosis in the lower thoracic spine. Old, healed right rib fractures. IMPRESSION: No acute abnormality. Electronically Signed   By: Beckie Salts M.D.   On: 10/14/2020 13:31   CT Renal Stone Study  Result Date: 10/14/2020 CLINICAL DATA:  Flank pain EXAM: CT ABDOMEN  AND PELVIS WITHOUT CONTRAST TECHNIQUE: Multidetector CT imaging of the abdomen and pelvis was performed following the standard protocol without IV contrast. COMPARISON:  02/06/2019 FINDINGS: Lower chest: No acute abnormality. Hepatobiliary: Unremarkable unenhanced appearance of the liver. No focal liver lesion identified. Gallbladder within normal limits. No hyperdense gallstone. No biliary dilatation. Pancreas: Unremarkable. No pancreatic ductal dilatation or surrounding inflammatory changes. Spleen: Normal in size without focal abnormality. Adrenals/Urinary Tract: Unremarkable adrenal glands. Bilateral kidneys demonstrate a normal noncontrast appearance. No renal stone or hydronephrosis. No ureteral calculus is identified. There are multiple bilateral pelvic phleboliths. Urinary bladder is within normal limits. Stomach/Bowel: Stomach is within normal limits. Appendix appears normal (series 2, image 51). No evidence of bowel wall thickening,  distention, or inflammatory changes. Moderate volume of stool within the colon. Vascular/Lymphatic: Aortoiliac atherosclerosis without evidence of aneurysm. No abdominopelvic lymphadenopathy. Reproductive: Anteverted uterus with coarsely calcified fundal fibroid. No adnexal masses. Other: No free fluid. No abdominopelvic fluid collection. No pneumoperitoneum. No abdominal wall hernia. Musculoskeletal: Lumbar dextrocurvature with severe degenerative disc disease within the lumbar spine, similar to prior. No new or acute osseous findings. IMPRESSION: 1. No acute abdominopelvic findings. Specifically, no evidence of obstructive uropathy. 2. Moderate volume of stool within the colon. 3. Lumbar levocurvature with severe degenerative disc disease. 4. Aortic atherosclerosis. (ICD10-I70.0). Electronically Signed   By: Duanne Guess D.O.   On: 10/14/2020 14:50    EKG: Independently reviewed.  It is rhythm normal axis no T wave abnormalities.  Assessment/Plan Severe sepsis due  to acute sinusitis: Ct head showed pansinusitis with a small 0.7 x 1.0 cm abnormal soft tissue density.  Neurology and ENT were consulted they recommended no further imaging just empiric antibiotics. She was started on IV Levaquin she is allergic to penicillins which has anaphylaxis, blood cultures have been ordered. We will go ahead and consult physical therapy and Occupational Therapy to evaluate. We will go ahead and check an A1c she has no necrosis seen on CT unlikely to be mucormycosis. Consult physical therapy.  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  Acute metabolic encephalopathy: Likely due to infectious etiology  CKD (chronic kidney disease), stage III (HCC): Seems to be at baseline.    DVT Prophylaxis: lovenox Code Status: full  Family Communication: daughter  Disposition Plan: inpatient     It is my clinical opinion that admission to INPATIENT is reasonable and necessary in this 79 y.o. female presenting with symptoms of severe sepsis with acute encephalopathy due to acute sinusitis she has a mild leukocytosis and a significant elevated sed rate.  We will go ahead and there is inpatient start her on IV Levaquin and monitor for the next 72 hours.   iven the aforementioned, the predictability of an adverse outcome is felt to be significant. I expect that the patient will require at least 2 midnights in the hospital to treat this condition.  Marinda Elk MD [facility]   10/14/2020, 5:04 PM

## 2020-10-14 NOTE — ED Triage Notes (Signed)
Pt arrived via EMS, from home, c/o headache and back pain x1 week, high blood sugars at home, increased her insulin with no effect. CBG's at home 300's.    356 CBG per EMS.

## 2020-10-14 NOTE — Progress Notes (Signed)
  PHARMACY NOTE:  ANTIMICROBIAL RENAL DOSAGE ADJUSTMENT  Current antimicrobial regimen includes a mismatch between antimicrobial dosage and estimated renal function.  As per policy approved by the Pharmacy & Therapeutics and Medical Executive Committees, the antimicrobial dosage will be adjusted accordingly.  Current antimicrobial dosage:  Levofloxacin 500 mg IV q24h   Indication: Pneumonia   Renal Function:  Estimated Creatinine Clearance: 37.4 mL/min (by C-G formula based on SCr of 0.89 mg/dL). []      On intermittent HD, scheduled: []      On CRRT    Antimicrobial dosage has been changed to:  Levofloxacin 750 mg IV q48h   Additional comments:   Thank you for allowing pharmacy to be a part of this patient's care.   , PharmD, BCPS 10/14/2020 7:12 PM

## 2020-10-14 NOTE — ED Notes (Signed)
Patients daughter in room 

## 2020-10-15 DIAGNOSIS — J0141 Acute recurrent pansinusitis: Secondary | ICD-10-CM | POA: Diagnosis not present

## 2020-10-15 DIAGNOSIS — R6520 Severe sepsis without septic shock: Secondary | ICD-10-CM

## 2020-10-15 DIAGNOSIS — N1831 Chronic kidney disease, stage 3a: Secondary | ICD-10-CM

## 2020-10-15 DIAGNOSIS — A419 Sepsis, unspecified organism: Principal | ICD-10-CM

## 2020-10-15 DIAGNOSIS — G9341 Metabolic encephalopathy: Secondary | ICD-10-CM

## 2020-10-15 LAB — URINALYSIS, ROUTINE W REFLEX MICROSCOPIC
Bacteria, UA: NONE SEEN
Bilirubin Urine: NEGATIVE
Glucose, UA: >=500 mg/dL — AB
Hgb urine dipstick: NEGATIVE
Ketones, ur: 5 mg/dL — AB
Leukocytes,Ua: NEGATIVE
Nitrite: NEGATIVE
Protein, ur: NEGATIVE mg/dL
Specific Gravity, Urine: 1.009 (ref 1.005–1.030)
pH: 6 (ref 5.0–8.0)

## 2020-10-15 LAB — CBC
HCT: 32.3 % — ABNORMAL LOW (ref 36.0–46.0)
Hemoglobin: 10.1 g/dL — ABNORMAL LOW (ref 12.0–15.0)
MCH: 27.6 pg (ref 26.0–34.0)
MCHC: 31.3 g/dL (ref 30.0–36.0)
MCV: 88.3 fL (ref 80.0–100.0)
Platelets: 363 10*3/uL (ref 150–400)
RBC: 3.66 MIL/uL — ABNORMAL LOW (ref 3.87–5.11)
RDW: 13.6 % (ref 11.5–15.5)
WBC: 12.2 10*3/uL — ABNORMAL HIGH (ref 4.0–10.5)
nRBC: 0 % (ref 0.0–0.2)

## 2020-10-15 LAB — BASIC METABOLIC PANEL
Anion gap: 11 (ref 5–15)
BUN: 18 mg/dL (ref 8–23)
CO2: 25 mmol/L (ref 22–32)
Calcium: 8 mg/dL — ABNORMAL LOW (ref 8.9–10.3)
Chloride: 97 mmol/L — ABNORMAL LOW (ref 98–111)
Creatinine, Ser: 0.92 mg/dL (ref 0.44–1.00)
GFR, Estimated: >60 mL/min (ref >60)
Glucose, Bld: 334 mg/dL — ABNORMAL HIGH (ref 70–99)
Potassium: 4.3 mmol/L (ref 3.5–5.1)
Sodium: 133 mmol/L — ABNORMAL LOW (ref 135–145)

## 2020-10-15 LAB — GLUCOSE, CAPILLARY
Glucose-Capillary: 344 mg/dL — ABNORMAL HIGH (ref 70–99)
Glucose-Capillary: 330 mg/dL — ABNORMAL HIGH (ref 70–99)
Glucose-Capillary: 214 mg/dL — ABNORMAL HIGH (ref 70–99)
Glucose-Capillary: 198 mg/dL — ABNORMAL HIGH (ref 70–99)

## 2020-10-15 MED ORDER — KETOROLAC TROMETHAMINE 15 MG/ML IJ SOLN
15.0000 mg | Freq: Once | INTRAMUSCULAR | Status: AC
  Administered 2020-10-15: 15 mg via INTRAVENOUS
  Filled 2020-10-15: qty 1

## 2020-10-15 MED ORDER — INSULIN GLARGINE 100 UNIT/ML ~~LOC~~ SOLN
12.0000 [IU] | Freq: Every day | SUBCUTANEOUS | Status: DC
  Administered 2020-10-15 – 2020-10-16 (×2): 12 [IU] via SUBCUTANEOUS
  Filled 2020-10-15 (×2): qty 0.12

## 2020-10-15 MED ORDER — INSULIN DETEMIR 100 UNIT/ML ~~LOC~~ SOLN: 10.0000 [IU] | Freq: Two times a day (BID) | SUBCUTANEOUS | Status: DC

## 2020-10-15 MED ORDER — MONTELUKAST SODIUM 10 MG PO TABS
10.0000 mg | ORAL_TABLET | Freq: Every day | ORAL | Status: DC
  Administered 2020-10-15: 10 mg via ORAL
  Filled 2020-10-15: qty 1

## 2020-10-15 MED ORDER — GUAIFENESIN-DM 100-10 MG/5ML PO SYRP: 5.0000 mL | ORAL_SOLUTION | ORAL | Status: DC | PRN

## 2020-10-15 MED ORDER — LORATADINE 10 MG PO TABS
10.0000 mg | ORAL_TABLET | Freq: Every day | ORAL | Status: DC
  Administered 2020-10-15 – 2020-10-16 (×2): 10 mg via ORAL
  Filled 2020-10-15 (×2): qty 1

## 2020-10-15 MED ORDER — GLUCERNA SHAKE PO LIQD
237.0000 mL | Freq: Two times a day (BID) | ORAL | Status: DC
  Administered 2020-10-15 – 2020-10-16 (×2): 237 mL via ORAL
  Filled 2020-10-15 (×3): qty 237

## 2020-10-15 MED ORDER — ADULT MULTIVITAMIN W/MINERALS CH
1.0000 | ORAL_TABLET | Freq: Every day | ORAL | Status: DC
  Administered 2020-10-15 – 2020-10-16 (×2): 1 via ORAL
  Filled 2020-10-15 (×2): qty 1

## 2020-10-15 NOTE — Progress Notes (Signed)
Pharmacy Allergy Assessment  Spoke with patient to clarify antibiotic allergies. Patient does not remember why she took Augmentin or Keflex before in the past. She also does not recognize the antibiotic names but states her daughter has the papers that list her allergy history. Patient reports she probably took them a long time ago, but could also have been within the past couple years. When asked about any reactions she may have had, she said it was most likely swelling, including swelling of the tongue, but did not require hospitalization. Given likely remote allergy history and tolerance of Keflex in the past, could consider a challenge with a 3rd generation cephalosporin or potentially low-dose amoxicillin.   Lucina Mellow (PharmD Candidate 2022)

## 2020-10-15 NOTE — Progress Notes (Signed)
Inpatient Diabetes Program Recommendations  AACE/ADA: New Consensus Statement on Inpatient Glycemic Control (2015)  Target Ranges:  Prepandial:   less than 140 mg/dL      Peak postprandial:   less than 180 mg/dL (1-2 hours)      Critically ill patients:  140 - 180 mg/dL   Lab Results  Component Value Date   GLUCAP 330 (H) 10/15/2020   HGBA1C 9.4 (H) 10/14/2020    Review of Glycemic Control  Diabetes history: type 1 Outpatient Diabetes medications: Lantus 12 units daily, Humalog 2-8 units TID SSI Current orders for Inpatient glycemic control: Lnatus 12 units daily, Novolog MODERATE correction scale TID & HS scale, Novolog 4 units TID  Inpatient Diabetes Program Recommendations:   Spoke with patient and daughter-in-law about patient's diabetes. States that she was diagnosed in 38. She knows what she needs. States that her blood sugars have been high recently, probably due to infection in her sinuses. Dr. Talmage Nap is her endocrinologist. A1C is 9.4%. states that she is aware of it being elevated. Lives alone in a house, but daughter and son live close by. Will probably need HH or help due to her unsteadiness at home.   Will continue to monitor blood sugars while in the hospital.  Smith Mince RN BSN CDE Diabetes Coordinator Pager: [phone]  8am-5pm

## 2020-10-15 NOTE — Progress Notes (Addendum)
[facility]                                                                              Patient Demographics  Danielle Clarke, is a 79 y.o. female, DOB - [birth year], YTK:[id number]  Admit date - 10/14/2020   Admitting Physician Charlynne Cousins, MD  Outpatient Primary MD for the patient is Hulan Fess, MD  Outpatient specialists:   LOS - 1  days   Medical records reviewed and are as summarized below:    Chief Complaint  Patient presents with  . Hyperglycemia  . Back Pain  . Headache       Brief summary   Patient is a 79 year old female with a history of CVA, diabetes mellitus 2, IDDM, hypertension, hyperlipidemia presented with confusion and hyperglycemia. Patient had received medical treatment for acute sinusitis over the past 6 weeks. She reported minimal improvement and related having headaches again about 4 weeks prior to admission after finishing antibiotic treatment. She reported facial pain. Denied any cough, shortness of breath, chest pain or fevers. Also that her sugars have been out of control and was taking extra insulin at home. CT of the head showed pansinusitis with small abscess. ENT was consulted   Assessment & Plan    Principal Problem:   Severe sepsis ([place]) with acute encephalopathy secondary to acute severe pansinusitis, present on admission -Patient met sepsis criteria at the time of admission with leukocytosis, hyperglycemia, acute encephalopathy -Sepsis physiology resolved, currently alert and oriented -CT head showed severe pansinusitis with small 0.7x 1.2 cm abnormal soft tissue density in the medial aspect of the anterior left orbit abutting the left lamina papyracea suspicious for possible subperiosteal abscess -EDP discussed with neurology, did not feel patient's headaches were from GCA or meningitis, likely due to sinusitis, currently headaches improving -ENT was consulted, Dr. Redmond Baseman, who reviewed CT images and did not  recommend any additional imaging, recommended continue with Levaquin and will see her for follow-up in office.  We will continue Levaquin orally at the time of discharge for at least 10 to 14 days given the severity of pansinusitis -Continue IV Levaquin, added Claritin and Singulair   Active Problems:   Type 1 diabetes mellitus, uncontrolled, IDDM with hyperglycemia -Continue sliding scale insulin, Lantus 12 units daily, NovoLog 4 units 3 times daily AC with sliding scale insulin Seen 9.4, diabetic coordinator consulted    CKD (chronic kidney disease), stage IIIa  (HCC) -Creatinine currently at baseline    Acute metabolic encephalopathy -Likely due to #1, currently alert and oriented, not consistent with meningitis or GCA, neurology was consulted by EDP and felt patient symptoms are due to severe pansinusitis   Code Status: DVT Prophylaxis:  Lovenox Family Communication: Discussed all imaging results, lab results, explained to the patient's daughter-in-law, Ms Analyssa Downs    Disposition Plan:     Status is: Inpatient  Remains inpatient appropriate because:Inpatient level of care appropriate due to severity of illness   Dispo: The patient is from: Home              Anticipated d/c is to: Home  Anticipated d/c date is: 1 day              Patient currently is not medically stable to d/c.  Symptoms improving however not still at baseline, hopefully DC next 24 to 48 hours, will transition to oral antibiotics at the time of discharge.      Time Spent in minutes 35 minutes  Procedures:  CT head  Consultants:   Neurology ENT, Dr. Redmond Baseman  Antimicrobials:   Anti-infectives (From admission, onward)   Start     Dose/Rate Route Frequency Ordered Stop   10/16/20 1000  levofloxacin (LEVAQUIN) IVPB 750 mg        750 mg 100 mL/hr over 90 Minutes Intravenous Every 48 hours 10/14/20 1913     10/14/20 1845  levofloxacin (LEVAQUIN) IVPB 500 mg  Status:  Discontinued         500 mg 100 mL/hr over 60 Minutes Intravenous Every 24 hours 10/14/20 1755 10/14/20 1914   10/14/20 1530  levofloxacin (LEVAQUIN) IVPB 750 mg        750 mg 100 mL/hr over 90 Minutes Intravenous  Once 10/14/20 1524 10/14/20 1716         Medications  Scheduled Meds: . enoxaparin (LOVENOX) injection  40 mg Subcutaneous Q24H  . insulin aspart  0-15 Units Subcutaneous TID WC  . insulin aspart  0-5 Units Subcutaneous QHS  . insulin aspart  4 Units Subcutaneous TID WC  . insulin glargine  12 Units Subcutaneous Daily  . loratadine  10 mg Oral Daily  . montelukast  10 mg Oral QHS  . simvastatin  40 mg Oral Daily   Continuous Infusions: . sodium chloride 75 mL/hr at 10/15/20 0715  . [START ON 10/16/2020] levofloxacin (LEVAQUIN) IV     PRN Meds:.acetaminophen **OR** acetaminophen, ondansetron **OR** ondansetron (ZOFRAN) IV, polyethylene glycol      Subjective:   Hatley Henegar was seen and examined today.  Patient feels that she is now improving somewhat from the time of admission, still congested in the sinuses but improving.  No headache at the time of my examination.  No fevers or chills. Patient denies dizziness, chest pain, shortness of breath, abdominal pain, N/V/D/C, new weakness, numbess, tingling. No acute events overnight.    Objective:   Vitals:   10/14/20 1759 10/14/20 2113 10/15/20 0141 10/15/20 0658  BP:  (!) 131/48 (!) 140/49 (!) 159/57  Pulse:  95 89 94  Resp:  20 20 (!) 21  Temp:  99.5 F (37.5 C) 97.8 F (36.6 C) 98.7 F (37.1 C)  TempSrc:  Oral Oral Oral  SpO2:  97% 96% 99%  Weight: 45.8 kg     Height: 5' (1.524 m)       Intake/Output Summary (Last 24 hours) at 10/15/2020 1316 Last data filed at 10/15/2020 0701 Gross per 24 hour  Intake 1717.14 ml  Output 850 ml  Net 867.14 ml     Wt Readings from Last 3 Encounters:  10/14/20 45.8 kg  04/28/20 53.9 kg  12/04/19 53.3 kg     Exam  General: Alert and oriented x 3, NAD  Cardiovascular: S1  S2 auscultated, no murmurs, RRR  Respiratory: Clear to auscultation bilaterally, no wheezing, rales or rhonchi  Gastrointestinal: Soft, nontender, nondistended, + bowel sounds  Ext: no pedal edema bilaterally  Neuro no new deficits  Musculoskeletal: No digital cyanosis, clubbing  Skin: No rashes  Psych: Normal affect and demeanor, alert and oriented x3    Data Reviewed:  I have personally  reviewed following labs and imaging studies  Micro Results Recent Results (from the past 240 hour(s))  Resp Panel by RT-PCR (Flu A&B, Covid) Nasopharyngeal Swab     Status: None   Collection Time: 10/14/20  3:17 PM   Specimen: Nasopharyngeal Swab; Nasopharyngeal(NP) swabs in vial transport medium  Result Value Ref Range Status   SARS Coronavirus 2 by RT PCR NEGATIVE NEGATIVE Final    Comment: (NOTE) SARS-CoV-2 target nucleic acids are NOT DETECTED.  The SARS-CoV-2 RNA is generally detectable in upper respiratory specimens during the acute phase of infection. The lowest concentration of SARS-CoV-2 viral copies this assay can detect is 138 copies/mL. A negative result does not preclude SARS-Cov-2 infection and should not be used as the sole basis for treatment or other patient management decisions. A negative result may occur with  improper specimen collection/handling, submission of specimen other than nasopharyngeal swab, presence of viral mutation(s) within the areas targeted by this assay, and inadequate number of viral copies(<138 copies/mL). A negative result must be combined with clinical observations, patient history, and epidemiological information. The expected result is Negative.  Fact Sheet for Patients:  EntrepreneurPulse.com.au  Fact Sheet for Healthcare Providers:  IncredibleEmployment.be  This test is no t yet approved or cleared by the [place] FDA and  has been authorized for detection and/or diagnosis of SARS-CoV-2 by FDA  under an Emergency Use Authorization (EUA). This EUA will remain  in effect (meaning this test can be used) for the duration of the COVID-19 declaration under Section 564(b)(1) of the Act, 21 U.S.C.section 360bbb-3(b)(1), unless the authorization is terminated  or revoked sooner.       Influenza A by PCR NEGATIVE NEGATIVE Final   Influenza B by PCR NEGATIVE NEGATIVE Final    Comment: (NOTE) The Xpert Xpress SARS-CoV-2/FLU/RSV plus assay is intended as an aid in the diagnosis of influenza from Nasopharyngeal swab specimens and should not be used as a sole basis for treatment. Nasal washings and aspirates are unacceptable for Xpert Xpress SARS-CoV-2/FLU/RSV testing.  Fact Sheet for Patients: EntrepreneurPulse.com.au  Fact Sheet for Healthcare Providers: IncredibleEmployment.be  This test is not yet approved or cleared by the [place] FDA and has been authorized for detection and/or diagnosis of SARS-CoV-2 by FDA under an Emergency Use Authorization (EUA). This EUA will remain in effect (meaning this test can be used) for the duration of the COVID-19 declaration under Section 564(b)(1) of the Act, 21 U.S.C. section 360bbb-3(b)(1), unless the authorization is terminated or revoked.  Performed at [facility], [place] [address], [place], [place] [address]     Radiology Reports CT Head Wo Contrast  Result Date: 10/14/2020 CLINICAL DATA:  Cerebral hemorrhage suspected additional history provided: Patient reports headache and back pain for 1 week. EXAM: CT HEAD WITHOUT CONTRAST TECHNIQUE: Contiguous axial images were obtained from the base of the skull through the vertex without intravenous contrast. COMPARISON:  Prior head CT examinations 03/27/2019 and earlier. FINDINGS: Brain: Chronic encephalomalacia within the anterior left frontal lobe and anterior temporal lobes at sites of prior hemorrhagic parenchymal contusions.  Background mild cerebral atrophy. There is no acute intracranial hemorrhage. No demarcated cortical infarct. No extra-axial fluid collection. No evidence of intracranial mass. No midline shift. Vascular: No hyperdense vessel.  Atherosclerotic calcifications Skull: No calvarial fracture. Sinuses/Orbits: New from the prior examination, there is severe pansinusitis with complete or near complete sinus opacification throughout. There is abnormal soft tissue density within the medial aspect of the anterior left orbit, abutting the left  lamina papyracea measuring 1.3 x 0.7 cm in transaxial dimension (series 2, image 6) (series 5, image 16). These results were called by telephone at the time of interpretation on 10/14/2020 at 2:55 pm to provider [facility] , who verbally acknowledged these results. IMPRESSION: No CT evidence of acute intracranial abnormality. New from the prior head CT of 03/27/2019, there is severe pansinusitis with complete or near complete sinus opacification throughout. Given the provided history of headache and back pain, consider CSF analysis if there is concern for a secondary meningitis. Abnormal soft tissue density within the medial aspect of the anterior left orbit, abutting the left lamina papyracea and measuring 1.3 x 0.7 cm. Given the severe adjacent left ethmoid sinusitis, this is suspicious for possible subperiosteal abscess. Contrast-enhanced CT or MRI of the orbits is recommended for further evaluation. Chronic encephalomalacia within the anterior left frontal lobe and anterior temporal lobes at sites of prior hemorrhagic parenchymal contusions. Background mild cerebral atrophy. Electronically Signed   By: Kellie Simmering DO   On: 10/14/2020 14:56   DG Chest Port 1 View  Result Date: 10/14/2020 CLINICAL DATA:  Weakness. EXAM: PORTABLE CHEST 1 VIEW COMPARISON:  02/16/2019 FINDINGS: Normal sized heart. Partially calcified thoracic aorta. Stable left subclavian bipolar pacemaker leads.  Clear lungs with normal vascularity. Stable right shoulder prosthesis and cervical spine fixation hardware. Stable mild to moderate dextroconvex scoliosis in the lower thoracic spine. Old, healed right rib fractures. IMPRESSION: No acute abnormality. Electronically Signed   By: Claudie Revering M.D.   On: 10/14/2020 13:31   CT Renal Stone Study  Result Date: 10/14/2020 CLINICAL DATA:  Flank pain EXAM: CT ABDOMEN AND PELVIS WITHOUT CONTRAST TECHNIQUE: Multidetector CT imaging of the abdomen and pelvis was performed following the standard protocol without IV contrast. COMPARISON:  02/06/2019 FINDINGS: Lower chest: No acute abnormality. Hepatobiliary: Unremarkable unenhanced appearance of the liver. No focal liver lesion identified. Gallbladder within normal limits. No hyperdense gallstone. No biliary dilatation. Pancreas: Unremarkable. No pancreatic ductal dilatation or surrounding inflammatory changes. Spleen: Normal in size without focal abnormality. Adrenals/Urinary Tract: Unremarkable adrenal glands. Bilateral kidneys demonstrate a normal noncontrast appearance. No renal stone or hydronephrosis. No ureteral calculus is identified. There are multiple bilateral pelvic phleboliths. Urinary bladder is within normal limits. Stomach/Bowel: Stomach is within normal limits. Appendix appears normal (series 2, image 51). No evidence of bowel wall thickening, distention, or inflammatory changes. Moderate volume of stool within the colon. Vascular/Lymphatic: Aortoiliac atherosclerosis without evidence of aneurysm. No abdominopelvic lymphadenopathy. Reproductive: Anteverted uterus with coarsely calcified fundal fibroid. No adnexal masses. Other: No free fluid. No abdominopelvic fluid collection. No pneumoperitoneum. No abdominal wall hernia. Musculoskeletal: Lumbar dextrocurvature with severe degenerative disc disease within the lumbar spine, similar to prior. No new or acute osseous findings. IMPRESSION: 1. No acute  abdominopelvic findings. Specifically, no evidence of obstructive uropathy. 2. Moderate volume of stool within the colon. 3. Lumbar levocurvature with severe degenerative disc disease. 4. Aortic atherosclerosis. (ICD10-I70.0). Electronically Signed   By: Davina Poke D.O.   On: 10/14/2020 14:50    Lab Data:  CBC: Recent Labs  Lab 10/14/20 1300 10/15/20 0346  WBC 12.6* 12.2*  NEUTROABS 10.4*  --   HGB 10.7* 10.1*  HCT 32.7* 32.3*  MCV 87.0 88.3  PLT 376 619   Basic Metabolic Panel: Recent Labs  Lab 10/14/20 1300 10/15/20 0346  NA 134* 133*  K 4.4 4.3  CL 91* 97*  CO2 31 25  GLUCOSE 384* 334*  BUN 19 18  CREATININE  0.89 0.92  CALCIUM 8.4* 8.0*   GFR: Estimated Creatinine Clearance: 36.2 mL/min (by C-G formula based on SCr of 0.92 mg/dL). Liver Function Tests: Recent Labs  Lab 10/14/20 1300  AST 21  ALT 16  ALKPHOS 167*  BILITOT 0.4  PROT 7.3  ALBUMIN 2.7*   Recent Labs  Lab 10/14/20 1300  LIPASE 23   No results for input(s): AMMONIA in the last 168 hours. Coagulation Profile: No results for input(s): INR, PROTIME in the last 168 hours. Cardiac Enzymes: No results for input(s): CKTOTAL, CKMB, CKMBINDEX, TROPONINI in the last 168 hours. BNP (last 3 results) No results for input(s): PROBNP in the last 8760 hours. HbA1C: Recent Labs    10/14/20 1300  HGBA1C 9.4*   CBG: Recent Labs  Lab 10/14/20 1811 10/14/20 2124 10/14/20 2334 10/15/20 0732 10/15/20 1142  GLUCAP 306* 116* 234* 344* 330*   Lipid Profile: No results for input(s): CHOL, HDL, LDLCALC, TRIG, CHOLHDL, LDLDIRECT in the last 72 hours. Thyroid Function Tests: No results for input(s): TSH, T4TOTAL, FREET4, T3FREE, THYROIDAB in the last 72 hours. Anemia Panel: No results for input(s): VITAMINB12, FOLATE, FERRITIN, TIBC, IRON, RETICCTPCT in the last 72 hours. Urine analysis:    Component Value Date/Time   COLORURINE YELLOW 10/14/2020 1216   APPEARANCEUR CLEAR 10/14/2020 1216    LABSPEC 1.009 10/14/2020 1216   PHURINE 6.0 10/14/2020 1216   GLUCOSEU >=500 (A) 10/14/2020 1216   HGBUR NEGATIVE 10/14/2020 1216   BILIRUBINUR NEGATIVE 10/14/2020 1216   KETONESUR 5 (A) 10/14/2020 1216   PROTEINUR NEGATIVE 10/14/2020 1216   UROBILINOGEN 0.2 10/07/2014 1808   NITRITE NEGATIVE 10/14/2020 1216   LEUKOCYTESUR NEGATIVE 10/14/2020 1216     Christon Parada M.D. [facility] 10/15/2020, 1:16 PM   Call night coverage person covering after 7pm

## 2020-10-15 NOTE — Evaluation (Signed)
Physical Therapy Evaluation Patient Details Name: Danielle Clarke MRN: [id number] DOB: 01-09-1941 Today's Date: 10/15/2020   History of Present Illness  Pt is a 79 y.o. female PMH of CVA, DMII poorly controlled, who has received multiple treatment for acute sinusitis over the past 6 weeks with minimal improvements. Pt dx with severe sepsis due to acute sinusitis  Clinical Impression  Pt admitted as above and presenting with functional mobility limitations 2* generalized weakness and mild ambulatory balance deficits.  Pt should progress to dc home with family assist.  This date, pt up to ambulate in hall with RW and with SPC - noted decreased stability with SPC and pt agreeable to use of RW initially on return home.    Follow Up Recommendations No PT follow up    Equipment Recommendations  None recommended by PT    Recommendations for Other Services       Precautions / Restrictions Precautions Precautions: None Restrictions Weight Bearing Restrictions: No      Mobility  Bed Mobility Overal bed mobility: Modified Independent                  Transfers Overall transfer level: Modified independent Equipment used: Rolling walker (2 wheeled)                Ambulation/Gait Ambulation/Gait assistance: Min guard;Supervision Gait Distance (Feet): 250 Feet Assistive device: Rolling walker (2 wheeled);Straight cane Gait Pattern/deviations: Step-through pattern;Decreased step length - right;Decreased step length - left;Shuffle;Trunk flexed     General Gait Details: pt ambulated 125' with RW and min guard/sup and additional 125' with SPC and min guard assist but with noted increased instability but no overt LOB  Stairs            Wheelchair Mobility    Modified Rankin (Stroke Patients Only)       Balance Overall balance assessment: Mild deficits observed, not formally tested                                           Pertinent  Vitals/Pain Pain Assessment: Faces Faces Pain Scale: Hurts little more Pain Location: back and head/neck    Home Living Family/patient expects to be discharged to:: Private residence Living Arrangements: Alone Available Help at Discharge: Family;Neighbor;Available PRN/intermittently Type of Home: House Home Access: Stairs to enter Entrance Stairs-Rails: Left Entrance Stairs-Number of Steps: 2 Home Layout: One level Home Equipment: Walker - 2 wheels;Cane - single point;Shower seat;Grab bars - tub/shower;Bedside commode Additional Comments: Dtr lives next door but is flight attendant    Prior Function Level of Independence: Independent               Hand Dominance   Dominant Hand: Right    Extremity/Trunk Assessment   Upper Extremity Assessment Upper Extremity Assessment: Overall WFL for tasks assessed    Lower Extremity Assessment Lower Extremity Assessment: Generalized weakness       Communication   Communication: No difficulties  Cognition Arousal/Alertness: Awake/alert Behavior During Therapy: WFL for tasks assessed/performed Overall Cognitive Status: Within Functional Limits for tasks assessed                                        General Comments      Exercises     Assessment/Plan    PT  Assessment Patient needs continued PT services  PT Problem List Decreased strength;Decreased activity tolerance;Decreased balance;Decreased mobility;Decreased knowledge of use of DME       PT Treatment Interventions DME instruction;Gait training;Stair training;Functional mobility training;Therapeutic activities;Therapeutic exercise;Patient/family education;Balance training    PT Goals (Current goals can be found in the Care Plan section)  Acute Rehab PT Goals Patient Stated Goal: feel better PT Goal Formulation: With patient Time For Goal Achievement: 10/29/20 Potential to Achieve Goals: Good    Frequency Min 3X/week   Barriers to discharge         Co-evaluation               AM-PAC PT "6 Clicks" Mobility  Outcome Measure Help needed turning from your back to your side while in a flat bed without using bedrails?: None Help needed moving from lying on your back to sitting on the side of a flat bed without using bedrails?: None Help needed moving to and from a bed to a chair (including a wheelchair)?: A Little Help needed standing up from a chair using your arms (e.g., wheelchair or bedside chair)?: A Little Help needed to walk in hospital room?: A Little Help needed climbing 3-5 steps with a railing? : A Little 6 Click Score: 20    End of Session Equipment Utilized During Treatment: Gait belt Activity Tolerance: Patient tolerated treatment well Patient left: in chair;with call bell/phone within reach;with chair alarm set Nurse Communication: Mobility status PT Visit Diagnosis: Muscle weakness (generalized) (M62.81);Difficulty in walking, not elsewhere classified (R26.2)    Time: 0347-4259 PT Time Calculation (min) (ACUTE ONLY): 30 min   Charges:   PT Evaluation $PT Eval Low Complexity: 1 Low PT Treatments $Gait Training: 8-22 mins        Mauro Kaufmann PT Acute Rehabilitation Services Pager [phone] Office [phone]   Kyliee Ortego 10/15/2020, 12:55 PM

## 2020-10-15 NOTE — Progress Notes (Signed)
Patient bedtime blood sugar was 116. Patient refused dose of levemir indicating she only takes it once daily in the morning.

## 2020-10-15 NOTE — Progress Notes (Signed)
Initial Nutrition Assessment  RD working remotely.  DOCUMENTATION CODES:   Not applicable  INTERVENTION:  - will order Glucerna Shake BID, each supplement provides 220 kcal and 10 grams of protein. - will order 1 tablet multivitamin with minerals/day. - will complete NFPE at follow-up.   NUTRITION DIAGNOSIS:   Increased nutrient needs related to acute illness as evidenced by estimated needs.  GOAL:   Patient will meet greater than or equal to 90% of their needs  MONITOR:   PO intake, Supplement acceptance, Labs, Weight trends  REASON FOR ASSESSMENT:   Malnutrition Screening Tool  ASSESSMENT:   79 year old female with a history of CVA, type 2 DM, HTN, and hyperlipidemia. She presented to the ED with confusion and hyperglycemia. Patient had received medical treatment for acute sinusitis over the past 6 weeks. She reported minimal improvement and reported headaches again about 4 weeks prior to admission after finishing antibiotic treatment. She reported facial pain and that CBGs had been out of control so she was taking extra insulin at home. CT head showed pansinusitis with small abscess. ENT was consulted.  Patient ate 75% of dinner last night; no other meal intakes documented.   Weight yesterday was 101 lb and PTA the most recently documented weight was on 04/28/20 when she weighed 118 lb. This indicates 17 lb weight loss (14% body weight) in the past 5.5 months; significant for time frame.  Per notes: - severe sepsis with acute encephalopathy 2/2 acute severe pansinusitis - no plan for additional imaging at this time - uncontrolled type 1 DM - stage 3 CKD   Labs reviewed; CBGs: 344 and 330 mg/dl, Na: 161 mmol/l, Cl: 97 mmol/l, Ca: 8 mg/dl. Medications reviewed; sliding scale novolog, 4 units novolog TID, 12 units lantus/day. IVF; NS @ 75 ml/hr.     NUTRITION - FOCUSED PHYSICAL EXAM:  unable to complete at this time.  Diet Order:   Diet Order            Diet Carb  Modified Fluid consistency: Thin; Room service appropriate? Yes  Diet effective now                 EDUCATION NEEDS:   Not appropriate for education at this time  Skin:  Skin Assessment: Reviewed RN Assessment  Last BM:  11/19  Height:   Ht Readings from Last 1 Encounters:  10/14/20 5' (1.524 m)    Weight:   Wt Readings from Last 1 Encounters:  10/14/20 45.8 kg     Estimated Nutritional Needs:  Kcal:  1600-1800 kcal Protein:  80-90 grams Fluid:  >/= 1.6 L/day     Trenton Gammon, MS, RD, LDN, CNSC Inpatient Clinical Dietitian RD pager # available in [facility]  After hours/weekend pager # available in [facility]

## 2020-10-15 NOTE — Evaluation (Signed)
Occupational Therapy Evaluation Patient Details Name: Danielle Clarke MRN: [id number] DOB: Jan 22, 1941 Today's Date: 10/15/2020    History of Present Illness Pt is a 79 y.o. female PMH of CVA, DMII poorly controlled, who has received multiple treatment for acute sinusitis over the past 6 weeks with minimal improvements. Pt dx with severe sepsis due to acute sinusitis   Clinical Impression   Patient reports she is active at baseline, motivated to get better. No physical assistance required or safety concerns with ADLs addressed during evaluation. Patient is at baseline, will discontinue acute OT services. Please re-consult if new needs arise.    Follow Up Recommendations  No OT follow up    Equipment Recommendations  None recommended by OT       Precautions / Restrictions Precautions Precautions: None Restrictions Weight Bearing Restrictions: No      Mobility Bed Mobility Overal bed mobility: Modified Independent                  Transfers Overall transfer level: Modified independent Equipment used: Rolling walker (2 wheeled)                  Balance Overall balance assessment: Modified Independent                                         ADL either performed or assessed with clinical judgement   ADL Overall ADL's : At baseline                                       General ADL Comments: patient demonstrates LB dressing, sink side g/h and functional transfers without physical assistance                   Pertinent Vitals/Pain Pain Assessment: Faces Faces Pain Scale: Hurts little more Pain Location: back and head/neck     Hand Dominance Right   Extremity/Trunk Assessment Upper Extremity Assessment Upper Extremity Assessment: Overall WFL for tasks assessed   Lower Extremity Assessment Lower Extremity Assessment: Defer to PT evaluation       Communication Communication Communication: No difficulties    Cognition Arousal/Alertness: Awake/alert Behavior During Therapy: WFL for tasks assessed/performed Overall Cognitive Status: Within Functional Limits for tasks assessed                                                Home Living Family/patient expects to be discharged to:: Private residence Living Arrangements: Alone Available Help at Discharge: Family;Neighbor;Available PRN/intermittently Type of Home: House Home Access: Stairs to enter [employer] of Steps: 2 Entrance Stairs-Rails: Left Home Layout: One level     Bathroom Shower/Tub: Chief Strategy Officer: Handicapped height     Home Equipment: Environmental consultant - 2 wheels;Cane - single point;Shower seat;Grab bars - tub/shower;Bedside commode          Prior Functioning/Environment Level of Independence: Independent                 OT Problem List: Pain         OT Goals(Current goals can be found in the care plan section) Acute Rehab OT Goals Patient Stated Goal: feel better  OT Goal Formulation: All assessment and education complete, DC therapy   AM-PAC OT "6 Clicks" Daily Activity     Outcome Measure Help from another person eating meals?: None Help from another person taking care of personal grooming?: None Help from another person toileting, which includes using toliet, bedpan, or urinal?: None Help from another person bathing (including washing, rinsing, drying)?: None Help from another person to put on and taking off regular upper body clothing?: None Help from another person to put on and taking off regular lower body clothing?: None 6 Click Score: 24   End of Session Equipment Utilized During Treatment: Rolling walker Nurse Communication: Mobility status  Activity Tolerance: Patient tolerated treatment well Patient left: in bed;with call bell/phone within reach;with bed alarm set  OT Visit Diagnosis: Pain Pain - part of body:  (back, head, neck)                 Time: 5027-7412 OT Time Calculation (min): 21 min Charges:  OT General Charges $OT Visit: 1 Visit OT Evaluation $OT Eval Low Complexity: 1 Low  Marlyce Huge OT OT pager: [phone]  Carmelia Roller 10/15/2020, 8:49 AM

## 2020-10-15 NOTE — TOC Initial Note (Signed)
Transition of Care [facility]) - Initial/Assessment Note    Patient Details  Name: Danielle Clarke MRN: [id number] Date of Birth: [birth year]  [facility]) CM/SW Contact:    Lennart Pall, LCSW Phone Number: 10/15/2020, 3:57 PM  Clinical Narrative:                 Met with pt and daughter-in-law this afternoon to review potential dc needs.  Pt very pleasant and is hopeful she will go home tomorrow.  She and family report that she has all needed DME from prior hospitalization.  Therapies for home not recommended.  Daughter-in-law with questions about home support/ CNA coverage.  Explained that this is not a service that is covered by insurance.  We discussed estimated cost/hour.  Daughter has looked into agencies already and has potential ones she may call.  No further questions/ concerns.  TOC available if anything further needed.  Expected Discharge Plan: Home/Self Care Barriers to Discharge: Continued Medical Work up   Patient Goals and CMS Choice Patient states their goals for this hospitalization and ongoing recovery are:: go home      Expected Discharge Plan and Services Expected Discharge Plan: Home/Self Care       Living arrangements for the past 2 months: Single Family Home                 DME Arranged: N/A DME Agency: NA       HH Arranged: NA [place] Agency: NA        Prior Living Arrangements/Services Living arrangements for the past 2 months: [place] Lives with:: Self Patient language and need for interpreter reviewed:: Yes Do you feel safe going back to the place where you live?: Yes      Need for Family Participation in Patient Care: No (Comment) Care giver support system in place?: Yes (comment)   Criminal Activity/Legal Involvement Pertinent to Current Situation/Hospitalization: No - Comment as needed  Activities of Daily Living Home Assistive Devices/Equipment: Cane (specify quad or straight), Eyeglasses, CBG Meter ADL Screening  (condition at time of admission) Patient's cognitive ability adequate to safely complete daily activities?: Yes Is the patient deaf or have difficulty hearing?: Yes Does the patient have difficulty seeing, even when wearing glasses/contacts?: Yes (blurry vision) Does the patient have difficulty concentrating, remembering, or making decisions?: No Patient able to express need for assistance with ADLs?: Yes Does the patient have difficulty dressing or bathing?: Yes Independently performs ADLs?: No Communication: Independent Dressing (OT): Independent Grooming: Independent Feeding: Independent Bathing: Needs assistance Is this a change from baseline?: Pre-admission baseline Toileting: Independent with device (comment) Is this a change from baseline?: Pre-admission baseline In/Out Bed: Independent with device (comment) Walks in Home: Independent with device (comment) (uses a cane) Does the patient have difficulty walking or climbing stairs?: Yes Weakness of Legs: Both Weakness of Arms/Hands: Both  Permission Sought/Granted                  Emotional Assessment Appearance:: Appears stated age Attitude/Demeanor/Rapport: Gracious Affect (typically observed): Accepting Orientation: : Oriented to Self, Oriented to Place, Oriented to Situation, Oriented to  Time Alcohol / Substance Use: Not Applicable Psych Involvement: No (comment)  Admission diagnosis:  Sinusitis, acute [J01.90] Sepsis ([place]) [A41.9] Patient Active Problem List   Diagnosis Date Noted  . Sinusitis, acute 10/14/2020  . Severe sepsis ([place]) 10/14/2020  . CKD (chronic kidney disease), stage III ([place]) 10/14/2020  . Acute metabolic encephalopathy 50/53/9767  . Sepsis ([place]) 10/14/2020  .  Abnormality of gait 12/01/2019  . 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 Traumatic brain injury with loss of consciousness ([place]) 05/28/2019  . Heart block AV complete ([place]) 05/20/2019  .  Hypertensive crisis   . Hypokalemia   . Leukocytosis   . Lethargy   . Labile blood pressure   . 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 Acute blood loss anemia   . Low grade fever   . Hypoglycemia   . Multiple closed fractures of ribs of right side   . Pleural effusion on right   . Hypoalbuminemia due to protein-calorie malnutrition ([place])   . Dysphagia   . 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 Pacemaker   . Labile blood glucose   . Diabetes mellitus type 2 in nonobese (H[facility])   . Dyslipidemia   . Essential hypertension   . Hypernatremia   . Anemia of chronic disease   . Hypertensive urgency 02/07/2019  . Intracerebral hemorrhage ([place]) 02/07/2019  . Intracranial bleeding ([place]) 02/06/2019  . Elevated troponin   . Abnormal EKG   . Bronchitis 08/05/2017  . Bilateral carotid artery stenosis   . Aortic stenosis 08/25/2015  . Cervical stenosis of spinal canal 07/31/2014  . CKD (chronic kidney disease) stage 2, GFR 60-89 ml/min 12/30/2013  . Essential hypertension, benign 12/30/2013  . Hyperlipidemia 12/30/2013  . Toe infection 12/30/2013  . Anemia 12/30/2013  . Acute renal failure ([place]) 12/29/2013  . Syncope 01/02/2013  . Diabetes mellitus type 1 ([place]) 01/02/2013  . Bilateral carotid artery disease ([place]) 01/02/2013  . GOITER, MULTINODULAR 02/11/2008   PCP:  Hulan Fess, MD Pharmacy:   [facility] [address] [place] [address] Phone: [phone] Fax: [phone]     Social Determinants of Health (SDOH) Interventions    Readmission Risk Interventions Readmission Risk Prevention Plan 10/15/2020  Post Dischage Appt Complete  Medication Screening Complete  Transportation Screening Complete  Some recent data might be hidden

## 2020-10-16 DIAGNOSIS — G9341 Metabolic encephalopathy: Secondary | ICD-10-CM | POA: Diagnosis not present

## 2020-10-16 DIAGNOSIS — N183 Chronic kidney disease, stage 3 unspecified: Secondary | ICD-10-CM | POA: Diagnosis not present

## 2020-10-16 DIAGNOSIS — A419 Sepsis, unspecified organism: Secondary | ICD-10-CM | POA: Diagnosis not present

## 2020-10-16 DIAGNOSIS — J0141 Acute recurrent pansinusitis: Secondary | ICD-10-CM | POA: Diagnosis not present

## 2020-10-16 LAB — GLUCOSE, CAPILLARY
Glucose-Capillary: 321 mg/dL — ABNORMAL HIGH (ref 70–99)
Glucose-Capillary: 291 mg/dL — ABNORMAL HIGH (ref 70–99)

## 2020-10-16 LAB — URINE CULTURE: Culture: <10000 — AB

## 2020-10-16 MED ORDER — LEVOFLOXACIN 750 MG PO TABS: 750.0000 mg | ORAL_TABLET | ORAL | 0 refills | Status: DC

## 2020-10-16 MED ORDER — LORATADINE 10 MG PO TABS
10.0000 mg | ORAL_TABLET | Freq: Every day | ORAL | 3 refills | Status: DC
Start: 2020-10-17 — End: 2022-12-14

## 2020-10-16 MED ORDER — MONTELUKAST SODIUM 10 MG PO TABS
10.0000 mg | ORAL_TABLET | Freq: Every day | ORAL | 3 refills | Status: DC
Start: 2020-10-16 — End: 2021-11-20

## 2020-10-16 MED ORDER — GUAIFENESIN-DM 100-10 MG/5ML PO SYRP: 5.0000 mL | ORAL_SOLUTION | ORAL | 0 refills | Status: DC | PRN

## 2020-10-16 MED ORDER — LEVOFLOXACIN 750 MG PO TABS: 750.0000 mg | ORAL_TABLET | ORAL | 0 refills | Status: AC

## 2020-10-16 NOTE — Plan of Care (Signed)

## 2020-10-16 NOTE — Discharge Summary (Signed)
Physician Discharge Summary   Patient ID: Danielle Clarke MRN: [id number] DOB/AGE: [birth year] 79 y.o.  Admit date: 10/14/2020 Discharge date: 10/16/2020  Primary Care Physician:  Hulan Fess, MD   Recommendations for Outpatient Follow-up:  1. Follow up with PCP in 1-2 weeks 2. Continue Levaquin 750 mg p.o. every 48 hours to complete full course of 10 days 3. Patient recommended to follow-up with Dr. Redmond Baseman, ENT and neck 7 to 10 days regarding severe pansinusitis  Home Health: Currently at baseline, seen by PT and OT, no follow-up needed Equipment/Devices:   Discharge Condition: stable  CODE STATUS: FULL Diet recommendation: Carb modified diet   Discharge Diagnoses:    Severe sepsis with acute encephalopathy secondary to acute severe pansinusitis, present on admission . acute pansinusitis . Type 1 diabetes mellitus, IDDM CKD stage III a Acute metabolic encephalopathy secondary to severe sepsis, resolved   Consults:  Neurology, ENT consulted by EDP    Allergies:   Allergies  Allergen Reactions  . Cefaclor Anaphylaxis    Tolerated cephalexin in 2018  . Tetracycline Anaphylaxis  . Vancomycin Anaphylaxis  . Augmentin [Amoxicillin-Pot Clavulanate] Other (See Comments)    Reaction unknown >> Anaphylaxis Has patient had a PCN reaction causing immediate rash, facial/tongue/throat swelling, SOB or lightheadedness with hypotension: Unknown Has patient had a PCN reaction causing severe rash involving mucus membranes or skin necrosis: Unknown Has patient had a PCN reaction that required hospitalization: Unknown Has patient had a PCN reaction occurring within the last 10 years: Unknown If all of the above answers are "NO", then may proceed with Cephalosporin use.   . Prednisone Other (See Comments)    Reaction unknown     DISCHARGE MEDICATIONS: Allergies as of 10/16/2020      Reactions   Cefaclor Anaphylaxis   Tolerated cephalexin in 2018   Tetracycline  Anaphylaxis   Vancomycin Anaphylaxis   Augmentin [amoxicillin-pot Clavulanate] Other (See Comments)   Reaction unknown >> Anaphylaxis Has patient had a PCN reaction causing immediate rash, facial/tongue/throat swelling, SOB or lightheadedness with hypotension: Unknown Has patient had a PCN reaction causing severe rash involving mucus membranes or skin necrosis: Unknown Has patient had a PCN reaction that required hospitalization: Unknown Has patient had a PCN reaction occurring within the last 10 years: Unknown If all of the above answers are "NO", then may proceed with Cephalosporin use.   Prednisone Other (See Comments)   Reaction unknown      Medication List    TAKE these medications   guaiFENesin-dextromethorphan 100-10 MG/5ML syrup Commonly known as: ROBITUSSIN DM Take 5 mLs by mouth every 4 (four) hours as needed for cough.   HumaLOG KwikPen 100 UNIT/ML KwikPen Generic drug: insulin lispro Inject 0-6 Units into the skin 3 (three) times daily as needed (high blood sugar). BS  80-150=2 units 151-200=3 units 201-250=4 units 251-300=5 units 301-350=6 units 351-400=7 units 401-450=8 units   insulin glargine 100 UNIT/ML Solostar Pen Commonly known as: LANTUS Inject 13 Units into the skin daily. What changed: how much to take   levofloxacin 750 MG tablet Commonly known as: Levaquin Take 1 tablet (750 mg total) by mouth every other day for 4 doses. Please start on 10/18/20 Start taking on: October 18, 2020   loratadine 10 MG tablet Commonly known as: CLARITIN Take 1 tablet (10 mg total) by mouth daily. Start taking on: October 17, 2020   montelukast 10 MG tablet Commonly known as: SINGULAIR Take 1 tablet (10 mg total) by mouth at bedtime.   simvastatin  40 MG tablet Commonly known as: ZOCOR Take 40 mg by mouth daily.   traMADol 50 MG tablet Commonly known as: ULTRAM Take 50 mg by mouth daily as needed for moderate pain.   VITAMIN B-12 PO Take 1 tablet by  mouth daily.   VITAMIN D PO Take 1 tablet by mouth daily.        Brief H and P: For complete details please refer to admission H and P, but in brief *Patient is a 79 year old female with a history of CVA, diabetes mellitus 2, IDDM, hypertension, hyperlipidemia presented with confusion and hyperglycemia. Patient had received medical treatment for acute sinusitis over the past 6 weeks. She reported minimal improvement and related having headaches again about 4 weeks prior to admission after finishing antibiotic treatment. She reported facial pain. Denied any cough, shortness of breath, chest pain or fevers. Also that her sugars have been out of control and was taking extra insulin at home. CT of the head showed pansinusitis with small abscess. ENT was consulted  Hospital Course:   Severe sepsis [facility]) with acute encephalopathy secondary to acute severe pansinusitis, present on admission -Patient met sepsis criteria at the time of admission with leukocytosis, hyperglycemia, acute encephalopathy -Sepsis physiology has resolved, currently alert and oriented, ambulating, tolerating diet -CT head showed severe pansinusitis with small 0.7x 1.2 cm abnormal soft tissue density in the medial aspect of the anterior left orbit abutting the left lamina papyracea suspicious for possible subperiosteal abscess -EDP discussed with neurology, did not feel patient's headaches were from GCA or meningitis, likely due to sinusitis, currently headaches improving -ENT was consulted, Dr. Redmond Baseman, who reviewed CT images and did not recommend any additional imaging, recommended continue with Levaquin and will see her for follow-up in office.   -Continue Levaquin orally at the time of discharge for total of 10 days given the severity of pansinusitis -added Claritin and Singulair      Type 1 diabetes mellitus, uncontrolled, IDDM with hyperglycemia -Resume outpatient insulin regimen, follow-up with PCP  outpatient Hemoglobin A1c 9.4     CKD (chronic kidney disease), stage IIIa  (HCC) -Creatinine currently at baseline, 0.9    Acute metabolic encephalopathy -Likely due to #1, currently alert and oriented, not consistent with meningitis or GCA, neurology was consulted by EDP and felt patient symptoms are due to severe pansinusitis   Day of Discharge S: Much less congested, feels a lot better from admission.  Alert and oriented, tolerating diet.  Wants to go home.  BP (!) 147/69 (BP Location: Left Arm)   Pulse 86   Temp 98.9 F (37.2 C) (Oral)   Resp 16   Ht 5' (1.524 m)   Wt 45.8 kg   SpO2 100%   BMI 19.73 kg/m   Physical Exam: General: Alert and awake oriented x3 not in any acute distress. HEENT: anicteric sclera, pupils reactive to light and accommodation CVS: S1-S2 clear no murmur rubs or gallops Chest: clear to auscultation bilaterally, no wheezing rales or rhonchi Abdomen: soft nontender, nondistended, normal bowel sounds Extremities: no cyanosis, clubbing or edema noted bilaterally Neuro: Cranial nerves II-XII intact, no focal neurological deficits    Get Medicines reviewed and adjusted: Please take all your medications with you for your next visit with your Primary MD  Please request your Primary MD to go over all hospital tests and procedure/radiological results at the follow up. Please ask your Primary MD to get all Hospital records sent to his/her office.  If you experience worsening of  your admission symptoms, develop shortness of breath, life threatening emergency, suicidal or homicidal thoughts you must seek medical attention immediately by calling 911 or calling your MD immediately  if symptoms less severe.  You must read complete instructions/literature along with all the possible adverse reactions/side effects for all the Medicines you take and that have been prescribed to you. Take any new Medicines after you have completely understood and accept all the  possible adverse reactions/side effects.   Do not drive when taking pain medications.   Do not take more than prescribed Pain, Sleep and Anxiety Medications  Special Instructions: If you have smoked or chewed Tobacco  in the last 2 yrs please stop smoking, stop any regular Alcohol  and or any Recreational drug use.  Wear Seat belts while driving.  Please note  You were cared for by a hospitalist during your hospital stay. Once you are discharged, your primary care physician will handle any further medical issues. Please note that NO REFILLS for any discharge medications will be authorized once you are discharged, as it is imperative that you return to your primary care physician (or establish a relationship with a primary care physician if you do not have one) for your aftercare needs so that they can reassess your need for medications and monitor your lab values.   The results of significant diagnostics from this hospitalization (including imaging, microbiology, ancillary and laboratory) are listed below for reference.      Procedures/Studies:  CT Head Wo Contrast  Result Date: 10/14/2020 CLINICAL DATA:  Cerebral hemorrhage suspected additional history provided: Patient reports headache and back pain for 1 week. EXAM: CT HEAD WITHOUT CONTRAST TECHNIQUE: Contiguous axial images were obtained from the base of the skull through the vertex without intravenous contrast. COMPARISON:  Prior head CT examinations 03/27/2019 and earlier. FINDINGS: Brain: Chronic encephalomalacia within the anterior left frontal lobe and anterior temporal lobes at sites of prior hemorrhagic parenchymal contusions. Background mild cerebral atrophy. There is no acute intracranial hemorrhage. No demarcated cortical infarct. No extra-axial fluid collection. No evidence of intracranial mass. No midline shift. Vascular: No hyperdense vessel.  Atherosclerotic calcifications Skull: No calvarial fracture. Sinuses/Orbits: New  from the prior examination, there is severe pansinusitis with complete or near complete sinus opacification throughout. There is abnormal soft tissue density within the medial aspect of the anterior left orbit, abutting the left lamina papyracea measuring 1.3 x 0.7 cm in transaxial dimension (series 2, image 6) (series 5, image 16). These results were called by telephone at the time of interpretation on 10/14/2020 at 2:55 pm to provider [facility] , who verbally acknowledged these results. IMPRESSION: No CT evidence of acute intracranial abnormality. New from the prior head CT of 03/27/2019, there is severe pansinusitis with complete or near complete sinus opacification throughout. Given the provided history of headache and back pain, consider CSF analysis if there is concern for a secondary meningitis. Abnormal soft tissue density within the medial aspect of the anterior left orbit, abutting the left lamina papyracea and measuring 1.3 x 0.7 cm. Given the severe adjacent left ethmoid sinusitis, this is suspicious for possible subperiosteal abscess. Contrast-enhanced CT or MRI of the orbits is recommended for further evaluation. Chronic encephalomalacia within the anterior left frontal lobe and anterior temporal lobes at sites of prior hemorrhagic parenchymal contusions. Background mild cerebral atrophy. Electronically Signed   By: Kellie Simmering DO   On: 10/14/2020 14:56   DG Chest Port 1 View  Result Date: 10/14/2020 CLINICAL DATA:  Weakness. EXAM: PORTABLE CHEST 1 VIEW COMPARISON:  02/16/2019 FINDINGS: Normal sized heart. Partially calcified thoracic aorta. Stable left subclavian bipolar pacemaker leads. Clear lungs with normal vascularity. Stable right shoulder prosthesis and cervical spine fixation hardware. Stable mild to moderate dextroconvex scoliosis in the lower thoracic spine. Old, healed right rib fractures. IMPRESSION: No acute abnormality. Electronically Signed   By: Claudie Revering M.D.   On:  10/14/2020 13:31   CT Renal Stone Study  Result Date: 10/14/2020 CLINICAL DATA:  Flank pain EXAM: CT ABDOMEN AND PELVIS WITHOUT CONTRAST TECHNIQUE: Multidetector CT imaging of the abdomen and pelvis was performed following the standard protocol without IV contrast. COMPARISON:  02/06/2019 FINDINGS: Lower chest: No acute abnormality. Hepatobiliary: Unremarkable unenhanced appearance of the liver. No focal liver lesion identified. Gallbladder within normal limits. No hyperdense gallstone. No biliary dilatation. Pancreas: Unremarkable. No pancreatic ductal dilatation or surrounding inflammatory changes. Spleen: Normal in size without focal abnormality. Adrenals/Urinary Tract: Unremarkable adrenal glands. Bilateral kidneys demonstrate a normal noncontrast appearance. No renal stone or hydronephrosis. No ureteral calculus is identified. There are multiple bilateral pelvic phleboliths. Urinary bladder is within normal limits. Stomach/Bowel: Stomach is within normal limits. Appendix appears normal (series 2, image 51). No evidence of bowel wall thickening, distention, or inflammatory changes. Moderate volume of stool within the colon. Vascular/Lymphatic: Aortoiliac atherosclerosis without evidence of aneurysm. No abdominopelvic lymphadenopathy. Reproductive: Anteverted uterus with coarsely calcified fundal fibroid. No adnexal masses. Other: No free fluid. No abdominopelvic fluid collection. No pneumoperitoneum. No abdominal wall hernia. Musculoskeletal: Lumbar dextrocurvature with severe degenerative disc disease within the lumbar spine, similar to prior. No new or acute osseous findings. IMPRESSION: 1. No acute abdominopelvic findings. Specifically, no evidence of obstructive uropathy. 2. Moderate volume of stool within the colon. 3. Lumbar levocurvature with severe degenerative disc disease. 4. Aortic atherosclerosis. (ICD10-I70.0). Electronically Signed   By: Davina Poke D.O.   On: 10/14/2020 14:50        LAB RESULTS: Basic Metabolic Panel: Recent Labs  Lab 10/14/20 1300 10/15/20 0346  NA 134* 133*  K 4.4 4.3  CL 91* 97*  CO2 31 25  GLUCOSE 384* 334*  BUN 19 18  CREATININE 0.89 0.92  CALCIUM 8.4* 8.0*   Liver Function Tests: Recent Labs  Lab 10/14/20 1300  AST 21  ALT 16  ALKPHOS 167*  BILITOT 0.4  PROT 7.3  ALBUMIN 2.7*   Recent Labs  Lab 10/14/20 1300  LIPASE 23   No results for input(s): AMMONIA in the last 168 hours. CBC: Recent Labs  Lab 10/14/20 1300 10/14/20 1300 10/15/20 0346  WBC 12.6*  --  12.2*  NEUTROABS 10.4*  --   --   HGB 10.7*  --  10.1*  HCT 32.7*  --  32.3*  MCV 87.0   < > 88.3  PLT 376  --  363   < > = values in this interval not displayed.   Cardiac Enzymes: No results for input(s): CKTOTAL, CKMB, CKMBINDEX, TROPONINI in the last 168 hours. BNP: Invalid input(s): POCBNP CBG: Recent Labs  Lab 10/16/20 0740 10/16/20 1200  GLUCAP 321* 291*       Disposition and Follow-up: Discharge Instructions    Increase activity slowly   Complete by: As directed        DISPOSITION: Home   DISCHARGE FOLLOW-UP  Follow-up Information    Melida Quitter, MD. Schedule an appointment as soon as possible for a visit in 1 week(s).   Specialty: Otolaryngology Why: ENT Contact information: [address]  [place] [address] [phone]        Hulan Fess, MD. Schedule an appointment as soon as possible for a visit in 2 week(s).   Specialty: Family Medicine Contact information: [place] [address] [phone]        Belva Crome, MD .   Specialty: Cardiology Contact information: [phone] [address] [place] [address] [phone]                Time coordinating discharge:  35 minutes  Signed:   Estill Cotta M.D. [facility] 10/16/2020, 1:06 PM

## 2020-10-27 NOTE — Progress Notes (Signed)
Cardiology Office Note:    Date:  10/28/2020   ID:  [name], DOB 1941/08/06, MRN [id number]  PCP:  Catha Gosselin, MD  Cardiologist:  Lesleigh Noe, MD   Referring MD: Catha Gosselin, MD   Chief Complaint  Patient presents with  . Cardiac Valve Problem    Aortic stenosis  . Irregular Heart Beat    History of Present Illness:    Danielle Clarke is a 79 y.o. female with a hx of aortic stenosis, recurring episodes of syncope,DM I,bilateral carotid disease, and essential hypertension.. 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  Accompanied by her daughter.  Sleeps a lot each day.  Physical activity is decreasing in quantity.  Exertional tolerance is relatively stable.  Her daughter states that she eats like a bird.  She snacks all day but does not eat hearty meals.  She denies syncope, angina, and dyspnea.  She denies dizzy episodes.  No lower extremity swelling.  Able to lie flat without shortness of breath.  Past Medical History:  Diagnosis Date  . Aortic stenosis, mild   . Arthritis   . Asthmatic bronchitis    with colds per patient  . Carotid artery disease (HCC)    40-59% bilateral ICA stenosis  . Cataract   . Cutaneous abscess of right foot   . Glaucoma   . Heart murmur    Dr Katrinka Blazing is her  cardiologist.   . Hyperlipemia   . Hypertension    Dr. Katrinka Blazing ~ 2 years ago  . Neck fracture [facility])    july 2013  . Osteoporosis   . Syncope   . Type 1 diabetes mellitus (HCC)     Past Surgical History:  Procedure Laterality Date  . ANKLE FRACTURE SURGERY Left 2001   steel plate and 3 screws   . ANTERIOR CERVICAL CORPECTOMY N/A 07/31/2014   Procedure: Cervical Four to Cervical Six Corpectomy;  Surgeon: Karn Cassis, MD;  Location: [facility] NEURO ORS;  Service: Neurosurgery;  Laterality: N/A;  C4 to C6 Corpectomy  . BREAST SURGERY  1988  . CATARACT EXTRACTION W/ INTRAOCULAR LENS  IMPLANT, BILATERAL Bilateral 2011   right and then left  . EYE SURGERY      . FRACTURE SURGERY    . HAMMER TOE SURGERY  1998  . I & D EXTREMITY Right 09/27/2018   Procedure: IRRIGATION AND DEBRIDEMENT RIGHT FOOT;  Surgeon: Nadara Mustard, MD;  Location: [facility] OR;  Service: Orthopedics;  Laterality: Right;  . JOINT REPLACEMENT    . PACEMAKER IMPLANT N/A 02/10/2019   Procedure: PACEMAKER IMPLANT;  Surgeon: Marinus Maw, MD;  Location: [facility] INVASIVE CV LAB;  Service: Cardiovascular;  Laterality: N/A;  . TONSILLECTOMY AND ADENOIDECTOMY  1948  . TOTAL SHOULDER REPLACEMENT  2010   right shoulder   . TUBAL LIGATION  1979  . TYMPANOSTOMY TUBE PLACEMENT Bilateral     Current Medications: Current Meds  Medication Sig  . Cyanocobalamin (VITAMIN B-12 PO) Take 1 tablet by mouth daily.  Marland Kitchen guaiFENesin-dextromethorphan (ROBITUSSIN DM) 100-10 MG/5ML syrup Take 5 mLs by mouth every 4 (four) hours as needed for cough.  Marland Kitchen HUMALOG KWIKPEN 100 UNIT/ML KwikPen Inject 0-6 Units into the skin 3 (three) times daily as needed (high blood sugar). BS  80-150=2 units 151-200=3 units 201-250=4 units 251-300=5 units 301-350=6 units 351-400=7 units 401-450=8 units  . Insulin Glargine (LANTUS) 100 UNIT/ML Solostar Pen Inject 13 Units into the skin daily.  Marland Kitchen levofloxacin (LEVAQUIN) 500 MG tablet Take 500  mg by mouth daily.  Marland Kitchen loratadine (CLARITIN) 10 MG tablet Take 1 tablet (10 mg total) by mouth daily.  . montelukast (SINGULAIR) 10 MG tablet Take 1 tablet (10 mg total) by mouth at bedtime.  . simvastatin (ZOCOR) 40 MG tablet Take 40 mg by mouth daily.  . traMADol (ULTRAM) 50 MG tablet Take 50 mg by mouth daily as needed for moderate pain.   Marland Kitchen VITAMIN D PO Take 1 tablet by mouth daily.     Allergies:   Cefaclor, Tetracycline, Vancomycin, Augmentin [amoxicillin-pot clavulanate], and Prednisone   Social History   Socioeconomic History  . Marital status: Divorced    Spouse name: Not on file  . Number of children: 2  . Years of education: [employer]  . Highest education level: Not on  file  Occupational History  . Occupation: Retired    Associate Professor: RETIRED  Tobacco Use  . Smoking status: Never Smoker  . Smokeless tobacco: Never Used  Vaping Use  . Vaping Use: Never used  Substance and Sexual Activity  . Alcohol use: Yes    Alcohol/week: 2.0 standard drinks    Types: 2 Glasses of wine per week  . Drug use: Never  . Sexual activity: Never  Other Topics Concern  . Not on file  Social History Narrative   Patient lives at home alone.   Caffeine Use:    Social Determinants of Health   Financial Resource Strain:   . Difficulty of Paying Living Expenses: Not on file  Food Insecurity:   . Worried About Programme researcher, broadcasting/film/video in the Last Year: Not on file  . Ran Out of Food in the Last Year: Not on file  Transportation Needs:   . Lack of Transportation (Medical): Not on file  . Lack of Transportation (Non-Medical): Not on file  Physical Activity:   . Days of Exercise per Week: Not on file  . Minutes of Exercise per Session: Not on file  Stress:   . Feeling of Stress : Not on file  Social Connections:   . Frequency of Communication with Friends and Family: Not on file  . Frequency of Social Gatherings with Friends and Family: Not on file  . Attends Religious Services: Not on file  . Active Member of Clubs or Organizations: Not on file  . Attends Banker Meetings: Not on file  . Marital Status: Not on file     Family History: The patient's family history includes Cancer in her mother; Heart Problems in her father.  ROS:   Please see the history of present illness.    She has drainage from the site of tooth extraction which is clear almost like mucus.  She has had sinusitis.  All other systems reviewed and are negative.  EKGs/Labs/Other Studies Reviewed:    The following studies were reviewed today:  2 D Doppler ECHOCARDIOGRAM 03/2020: IMPRESSIONS  1. Left ventricular ejection fraction, by estimation, is 60 to 65%. The  left ventricle has  normal function. The left ventricle has no regional  wall motion abnormalities. There is mild concentric left ventricular  hypertrophy. Left ventricular diastolic  parameters are consistent with Grade I diastolic dysfunction (impaired  relaxation). Elevated left atrial pressure. The average left ventricular  global longitudinal strain is -12.9 %.  2. Right ventricular systolic function is normal. The right ventricular  size is normal. There is mildly elevated pulmonary artery systolic  pressure. The estimated right ventricular systolic pressure is 33.7 mmHg.  3. The mitral  valve is degenerative. Mild mitral valve regurgitation. No  evidence of mitral stenosis.  4. The aortic valve is normal in structure. Aortic valve regurgitation is  not visualized. Moderate aortic valve stenosis.  5. The inferior vena cava is normal in size with greater than 50%  respiratory variability, suggesting right atrial pressure of 3 mmHg.   Comparison(s): 02/07/19 EF 60-65%. Moderate-severe AS 24mmHg mean PG,  42mmHg peak PG.   Bilateral carotid Doppler study September 2021: Summary:  Right Carotid: Velocities in the right ICA are consistent with a 1-39%  stenosis.         Non-hemodynamically significant plaque <50% noted in the  CCA.   Left Carotid: Velocities in the left ICA are consistent with a 1-39%  stenosis.        Non-hemodynamically significant plaque <50% noted in the  CCA. The        ECA appears >50% stenosed.   Vertebrals: Bilateral vertebral arteries demonstrate antegrade flow.  Subclavians: Normal flow hemodynamics were seen in bilateral subclavian        arteries.   EKG:  EKG not repeated on today's exam  Recent Labs: 10/14/2020: ALT 16 10/15/2020: BUN 18; Creatinine, Ser 0.92; Hemoglobin 10.1; Platelets 363; Potassium 4.3; Sodium 133  Recent Lipid Panel No results found for: CHOL, TRIG, HDL, CHOLHDL, VLDL, LDLCALC, LDLDIRECT  Physical Exam:     VS:  BP (!) 118/58   Pulse 90   Ht 5' (1.524 m)   Wt 107 lb 3.2 oz (48.6 kg)   SpO2 97%   BMI 20.94 kg/m     Wt Readings from Last 3 Encounters:  10/28/20 107 lb 3.2 oz (48.6 kg)  10/14/20 101 lb (45.8 kg)  04/28/20 118 lb 12.8 oz (53.9 kg)     GEN: Frail and appears older than stated age. No acute distress HEENT: Normal NECK: No JVD. LYMPHATICS: No lymphadenopathy CARDIAC: 3/6 course crescendo decrescendo systolic murmur from the left mid to right upper sternal border.  RRR without diastolic murmur, gallop, or edema. VASCULAR:  Normal Pulses. No bruits. RESPIRATORY:  Clear to auscultation without rales, wheezing or rhonchi  ABDOMEN: Soft, non-tender, non-distended, No pulsatile mass, MUSCULOSKELETAL: No deformity  SKIN: Warm and dry NEUROLOGIC:  Alert and oriented x 3 PSYCHIATRIC:  Normal affect   ASSESSMENT:    1. Nonrheumatic aortic valve stenosis   2. Essential hypertension, benign   3. Mixed hyperlipidemia   4. Bilateral carotid artery stenosis   5. Pacemaker   6. Educated about COVID-19 virus infection   7. Heart block AV complete (HCC)    PLAN:    In order of problems listed above:  1. Aortic stenosis without clinical symptoms.  Moderate in severity 6 months ago.  Plan repeat an echocardiogram in 6 months hence.  We discussed cardinal symptoms of aortic stenosis that would lead to notification and concern. 2. Blood pressure is not currently an issue.  Could be because aortic stenosis is getting worse.  Follow closely. 3. Continue Zocor 40 mg/day.  Last LDL was 82.  She does have carotid plaque as noted above. 4. Asymptomatic.  Continue primary prevention with monitoring of blood pressure, glucose, management of lipids, and use of antiplatelet therapy currently on an aspirin but needs to be.. 5. Normal pacemaker function based upon the 2 most recent reports. 6. Vaccinated and boosted.  Practicing social distancing. 7. Treated with pacemaker.  Overall  education and awareness concerning primary risk prevention was discussed in detail: LDL less than 70, hemoglobin  A1c less than 7, blood pressure target less than 130/80 mmHg, >150 minutes of moderate aerobic activity per week, avoidance of smoking, weight control (via diet and exercise), and continued surveillance/management of/for obstructive sleep apnea.    Medication Adjustments/Labs and Tests Ordered: Current medicines are reviewed at length with the patient today.  Concerns regarding medicines are outlined above.  No orders of the defined types were placed in this encounter.  No orders of the defined types were placed in this encounter.   There are no Patient Instructions on file for this visit.   Signed, Lesleigh Noe, MD  10/28/2020 1:38 PM    [facility]

## 2020-10-28 ENCOUNTER — Other Ambulatory Visit: Payer: Self-pay

## 2020-10-28 ENCOUNTER — Ambulatory Visit (INDEPENDENT_AMBULATORY_CARE_PROVIDER_SITE_OTHER): Payer: Medicare Other | Admitting: Interventional Cardiology

## 2020-10-28 ENCOUNTER — Encounter: Payer: Self-pay | Admitting: Interventional Cardiology

## 2020-10-28 VITALS — BP 118/58 | HR 90 | Ht 60.0 in | Wt 107.2 lb

## 2020-10-28 DIAGNOSIS — E782 Mixed hyperlipidemia: Secondary | ICD-10-CM

## 2020-10-28 DIAGNOSIS — Z7189 Other specified counseling: Secondary | ICD-10-CM

## 2020-10-28 DIAGNOSIS — Z950 Presence of cardiac pacemaker: Secondary | ICD-10-CM

## 2020-10-28 DIAGNOSIS — I350 Nonrheumatic aortic (valve) stenosis: Secondary | ICD-10-CM

## 2020-10-28 DIAGNOSIS — I6523 Occlusion and stenosis of bilateral carotid arteries: Secondary | ICD-10-CM

## 2020-10-28 DIAGNOSIS — I442 Atrioventricular block, complete: Secondary | ICD-10-CM

## 2020-10-28 DIAGNOSIS — I10 Essential (primary) hypertension: Secondary | ICD-10-CM | POA: Diagnosis not present

## 2020-10-28 NOTE — Patient Instructions (Signed)
Medication Instructions:  Your physician recommends that you continue on your current medications as directed. Please refer to the Current Medication list given to you today.  *If you need a refill on your cardiac medications before your next appointment, please call your pharmacy*   Lab Work: None If you have labs (blood work) drawn today and your tests are completely normal, you will receive your results only by: . MyChart Message (if you have MyChart) OR . A paper copy in the mail If you have any lab test that is abnormal or we need to change your treatment, we will call you to review the results.   Testing/Procedures: Your physician has requested that you have an echocardiogram. Echocardiography is a painless test that uses sound waves to create images of your heart. It provides your doctor with information about the size and shape of your heart and how well your heart's chambers and valves are working. This procedure takes approximately one hour. There are no restrictions for this procedure.     Follow-Up: At [facility], you and your health needs are our priority.  As part of our continuing mission to provide you with exceptional heart care, we have created designated Provider Care Teams.  These Care Teams include your primary Cardiologist (physician) and Advanced Practice Providers (APPs -  Physician Assistants and Nurse Practitioners) who all work together to provide you with the care you need, when you need it.  We recommend signing up for the patient portal called "MyChart".  Sign up information is provided on this After Visit Summary.  MyChart is used to connect with patients for Virtual Visits (Telemedicine).  Patients are able to view lab/test results, encounter notes, upcoming appointments, etc.  Non-urgent messages can be sent to your provider as well.   To learn more about what you can do with MyChart, go to [email or web address].    Your next appointment:   6  month(s)  The format for your next appointment:   In Person  Provider:   You may see Henry W Smith III, MD or one of the following Advanced Practice Providers on your designated Care Team:    Lori Gerhardt, NP  Laura Ingold, NP  Jill McDaniel, NP    Other Instructions   

## 2020-11-01 DIAGNOSIS — R739 Hyperglycemia, unspecified: Secondary | ICD-10-CM

## 2020-11-09 ENCOUNTER — Other Ambulatory Visit: Payer: Medicare Other

## 2020-11-17 ENCOUNTER — Ambulatory Visit (INDEPENDENT_AMBULATORY_CARE_PROVIDER_SITE_OTHER): Payer: Medicare Other

## 2020-11-17 DIAGNOSIS — I442 Atrioventricular block, complete: Secondary | ICD-10-CM

## 2020-11-17 LAB — CUP PACEART REMOTE DEVICE CHECK
Date Time Interrogation Session: 20211222081615
Implantable Lead Implant Date: 20200316
Implantable Lead Implant Date: 20200316
Implantable Lead Location: 753859
Implantable Lead Location: 753860
Implantable Lead Model: 377
Implantable Lead Model: 377
Implantable Lead Serial Number: 81099636
Implantable Lead Serial Number: 81165051
Implantable Pulse Generator Implant Date: 20200316
Pulse Gen Model: 407145
Pulse Gen Serial Number: 69557134

## 2020-11-25 ENCOUNTER — Ambulatory Visit
Admission: RE | Admit: 2020-11-25 | Discharge: 2020-11-25 | Disposition: A | Discharge status: Home / Self Care | Payer: Medicare Other | Source: Ambulatory Visit | Attending: Family Medicine | Admitting: Family Medicine

## 2020-11-25 ENCOUNTER — Other Ambulatory Visit: Payer: Self-pay

## 2020-11-25 DIAGNOSIS — M8580 Other specified disorders of bone density and structure, unspecified site: Secondary | ICD-10-CM

## 2020-11-29 ENCOUNTER — Ambulatory Visit: Payer: Medicare Other | Admitting: Physical Medicine & Rehabilitation

## 2020-12-01 NOTE — Progress Notes (Signed)
Remote pacemaker transmission.   

## 2021-02-16 ENCOUNTER — Ambulatory Visit (INDEPENDENT_AMBULATORY_CARE_PROVIDER_SITE_OTHER): Payer: Medicare HMO

## 2021-02-16 DIAGNOSIS — I442 Atrioventricular block, complete: Secondary | ICD-10-CM | POA: Diagnosis not present

## 2021-02-16 LAB — CUP PACEART REMOTE DEVICE CHECK
Battery Remaining Percentage: 85 %
Brady Statistic RA Percent Paced: 31 %
Brady Statistic RV Percent Paced: 0 %
Date Time Interrogation Session: 20220323120255
Implantable Lead Implant Date: 20200316
Implantable Lead Implant Date: 20200316
Implantable Lead Location: 753859
Implantable Lead Location: 753860
Implantable Lead Model: 377
Implantable Lead Model: 377
Implantable Lead Serial Number: 81099636
Implantable Lead Serial Number: 81165051
Implantable Pulse Generator Implant Date: 20200316
Lead Channel Impedance Value: 449 Ohm
Lead Channel Impedance Value: 554 Ohm
Lead Channel Pacing Threshold Amplitude: 0.5 V
Lead Channel Pacing Threshold Amplitude: 1 V
Lead Channel Pacing Threshold Pulse Width: 0.4 ms
Lead Channel Pacing Threshold Pulse Width: 0.4 ms
Lead Channel Sensing Intrinsic Amplitude: 1.3 mV
Lead Channel Sensing Intrinsic Amplitude: 8.1 mV
Lead Channel Setting Pacing Amplitude: 2.4 V
Lead Channel Setting Pacing Amplitude: 2.4 V
Lead Channel Setting Pacing Pulse Width: 0.4 ms
Pulse Gen Model: 407145
Pulse Gen Serial Number: 69557134

## 2021-02-28 NOTE — Progress Notes (Signed)
Remote pacemaker transmission.   

## 2021-03-02 DIAGNOSIS — I350 Nonrheumatic aortic (valve) stenosis: Secondary | ICD-10-CM | POA: Diagnosis not present

## 2021-03-02 DIAGNOSIS — Z794 Long term (current) use of insulin: Secondary | ICD-10-CM | POA: Diagnosis not present

## 2021-03-02 DIAGNOSIS — N1831 Chronic kidney disease, stage 3a: Secondary | ICD-10-CM | POA: Diagnosis not present

## 2021-03-02 DIAGNOSIS — I10 Essential (primary) hypertension: Secondary | ICD-10-CM | POA: Diagnosis not present

## 2021-03-02 DIAGNOSIS — I779 Disorder of arteries and arterioles, unspecified: Secondary | ICD-10-CM | POA: Diagnosis not present

## 2021-03-02 DIAGNOSIS — E109 Type 1 diabetes mellitus without complications: Secondary | ICD-10-CM | POA: Diagnosis not present

## 2021-03-02 DIAGNOSIS — E7800 Pure hypercholesterolemia, unspecified: Secondary | ICD-10-CM | POA: Diagnosis not present

## 2021-03-07 DIAGNOSIS — H40033 Anatomical narrow angle, bilateral: Secondary | ICD-10-CM | POA: Diagnosis not present

## 2021-03-07 DIAGNOSIS — H43393 Other vitreous opacities, bilateral: Secondary | ICD-10-CM | POA: Diagnosis not present

## 2021-03-10 ENCOUNTER — Telehealth: Payer: Self-pay | Admitting: Interventional Cardiology

## 2021-03-10 NOTE — Telephone Encounter (Signed)
Spoke with pt and made her aware that I am waiting on Dr. Katrinka Blazing to complete his portion.  She needs it completed by Monday so she can pick it up and take the other portion to Dr. Lurene Shadow to complete.  Advised I will have Dr. Katrinka Blazing complete form today or tomorrow and place at the front desk for her.  Pt appreciative for call.

## 2021-03-10 NOTE — Telephone Encounter (Signed)
Patient was calling to see if paperwork that she dropped out was fill out yet by Dr Katrinka Blazing. Please advise

## 2021-03-10 NOTE — Telephone Encounter (Signed)
Paperwork completed and placed at front desk.

## 2021-03-15 DIAGNOSIS — E1065 Type 1 diabetes mellitus with hyperglycemia: Secondary | ICD-10-CM | POA: Diagnosis not present

## 2021-04-27 ENCOUNTER — Other Ambulatory Visit: Payer: Self-pay

## 2021-04-27 ENCOUNTER — Ambulatory Visit (HOSPITAL_COMMUNITY): Payer: Medicare HMO | Attending: Internal Medicine

## 2021-04-27 DIAGNOSIS — I350 Nonrheumatic aortic (valve) stenosis: Secondary | ICD-10-CM | POA: Insufficient documentation

## 2021-04-27 LAB — ECHOCARDIOGRAM COMPLETE
AR max vel: 0.7 cm2
AV Area VTI: 0.65 cm2
AV Area mean vel: 0.6 cm2
AV Mean grad: 24.3 mmHg
AV Peak grad: 44.3 mmHg
Ao pk vel: 3.33 m/s
Area-P 1/2: 2.73 cm2
S' Lateral: 2.3 cm

## 2021-05-04 NOTE — Progress Notes (Signed)
Cardiology Office Note:    Date:  05/04/2021   ID:  Danielle Clarke, DOB [birth year], MRN [id number]  PCP:  Catha Gosselin, MD  Cardiologist:  Lesleigh Noe, MD   Referring MD: Catha Gosselin, MD   No chief complaint on file.   History of Present Illness:    Danielle Clarke is a 80 y.o. female with a hx of aortic stenosis, recurring episodes of syncope, DM I,  bilateral carotid disease, and essential hypertension.. 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  No shortness of breath, syncope, exertional dyspnea, peripheral edema, or angina.  Sleeps lying flat.  Living independently.  Past Medical History:  Diagnosis Date   Aortic stenosis, mild    Arthritis    Asthmatic bronchitis    with colds per patient   Carotid artery disease (HCC)    40-59% bilateral ICA stenosis   Cataract    Cutaneous abscess of right foot    Glaucoma    Heart murmur    Dr Katrinka Blazing is her  cardiologist.    Hyperlipemia    Hypertension    Dr. Katrinka Blazing ~ 2 years ago   Neck fracture [facility])    july 2013   Osteoporosis    Syncope    Type 1 diabetes mellitus [facility])     Past Surgical History:  Procedure Laterality Date   ANKLE FRACTURE SURGERY Left 2001   steel plate and 3 screws    ANTERIOR CERVICAL CORPECTOMY N/A 07/31/2014   Procedure: Cervical Four to Cervical Six Corpectomy;  Surgeon: Karn Cassis, MD;  Location: [facility] NEURO ORS;  Service: Neurosurgery;  Laterality: N/A;  C4 to C6 Corpectomy   BREAST SURGERY  1988   CATARACT EXTRACTION W/ INTRAOCULAR LENS  IMPLANT, BILATERAL Bilateral 2011   right and then left   EYE SURGERY     FRACTURE SURGERY     HAMMER TOE SURGERY  1998   I & D EXTREMITY Right 09/27/2018   Procedure: IRRIGATION AND DEBRIDEMENT RIGHT FOOT;  Surgeon: Nadara Mustard, MD;  Location: [facility] OR;  Service: Orthopedics;  Laterality: Right;   JOINT REPLACEMENT     PACEMAKER IMPLANT N/A 02/10/2019   Procedure: PACEMAKER IMPLANT;  Surgeon: Marinus Maw, MD;  Location: [facility]  INVASIVE CV LAB;  Service: Cardiovascular;  Laterality: N/A;   TONSILLECTOMY AND ADENOIDECTOMY  1948   TOTAL SHOULDER REPLACEMENT  2010   right shoulder    TUBAL LIGATION  1979   TYMPANOSTOMY TUBE PLACEMENT Bilateral     Current Medications: No outpatient medications have been marked as taking for the 05/06/21 encounter (Appointment) with Lyn Records, MD.     Allergies:   Cefaclor, Tetracycline, Vancomycin, Augmentin [amoxicillin-pot clavulanate], and Prednisone   Social History   Socioeconomic History   Marital status: Divorced    Spouse name: Not on file   Number of children: 2   Years of education: [employer]   Highest education level: Not on file  Occupational History   Occupation: Retired    Associate Professor: RETIRED  Tobacco Use   Smoking status: Never Smoker   Smokeless tobacco: Never Used  Vaping Use   Vaping Use: Never used  Substance and Sexual Activity   Alcohol use: Yes    Alcohol/week: 2.0 standard drinks    Types: 2 Glasses of wine per week   Drug use: Never   Sexual activity: Never  Other Topics Concern   Not on file  Social History Narrative   Patient lives at home alone.  Caffeine Use:    Social Determinants of Health   Financial Resource Strain: Not on file  Food Insecurity: Not on file  Transportation Needs: Not on file  Physical Activity: Not on file  Stress: Not on file  Social Connections: Not on file     Family History: The patient's family history includes Cancer in her mother; Heart Problems in her father.  ROS:   Please see the history of present illness.    Still pretty much staying to herself because of concerns about the pandemic.  Occasional low blood sugars all other systems reviewed and are negative.  EKGs/Labs/Other Studies Reviewed:    The following studies were reviewed today:  ECHOCARDIOGRAPHY 04/28/2021: IMPRESSIONS   1. Left ventricular ejection fraction, by estimation, is 60 to 65%. The  left ventricle has normal  function. The left ventricle has no regional  wall motion abnormalities. There is mild concentric left ventricular  hypertrophy. Left ventricular diastolic  parameters are indeterminate.   2. Right ventricular systolic function is normal. The right ventricular  size is normal. There is moderately elevated pulmonary artery systolic  pressure.   3. Left atrial size was moderately dilated.   4. The mitral valve is degenerative. Mild mitral valve regurgitation. The  mean mitral valve gradient is 4.0 mmHg with average heart rate of 73 bpm.  Severe mitral annular calcification.   5. The aortic valve is calcified. There is severe calcifcation of the  aortic valve. There is moderate thickening of the aortic valve. Aortic  valve regurgitation is not visualized. Moderate aortic valve stenosis.  Aortic valve area, by VTI measures 0.65  cm. Aortic valve mean gradient measures 24.3 mmHg. Aortic valve Vmax  measures 3.33 m/s.   Comparison(s): A prior study was performed on 04/02/2020. Prior images  reviewed side by side. Stable aortic valve gradients.    EKG:  EKG was last performed November 2021 and demonstrated normal sinus rhythm, borderline short PR interval, mild interventricular conduction delay.  Recent Labs: 10/14/2020: ALT 16 10/15/2020: BUN 18; Creatinine, Ser 0.92; Hemoglobin 10.1; Platelets 363; Potassium 4.3; Sodium 133  Recent Lipid Panel No results found for: CHOL, TRIG, HDL, CHOLHDL, VLDL, LDLCALC, LDLDIRECT  Physical Exam:    VS:  There were no vitals taken for this visit.    Wt Readings from Last 3 Encounters:  10/28/20 107 lb 3.2 oz (48.6 kg)  10/14/20 101 lb (45.8 kg)  04/28/20 118 lb 12.8 oz (53.9 kg)     GEN: Slender and frail. No acute distress HEENT: Normal NECK: No JVD. LYMPHATICS: No lymphadenopathy CARDIAC: 3/6 to 4/6 crescendo decrescendo systolic murmur. RRR S4 gallop, or edema. VASCULAR:  Normal Pulses. No bruits. RESPIRATORY:  Clear to auscultation  without rales, wheezing or rhonchi  ABDOMEN: Soft, non-tender, non-distended, No pulsatile mass, MUSCULOSKELETAL: No deformity  SKIN: Warm and dry NEUROLOGIC:  Alert and oriented x 3 PSYCHIATRIC:  Normal affect   ASSESSMENT:    1. Nonrheumatic aortic valve stenosis   2. Heart block AV complete (HCC)   3. Essential hypertension, benign   4. Mixed hyperlipidemia   5. Bilateral carotid artery stenosis   6. Pacemaker    PLAN:    In order of problems listed above:  Calcific aortic stenosis.  Repeat echo in 6 to 9 months.  Follow-up with new provider at that time as I will likely be retiring. Has pacemaker for high-grade AV block. Blood pressure is well controlled Continue simvastatin 40 mg/day Secondary prevention Device clinic follow-up next week.  Medication Adjustments/Labs and Tests Ordered: Current medicines are reviewed at length with the patient today.  Concerns regarding medicines are outlined above.  No orders of the defined types were placed in this encounter.  No orders of the defined types were placed in this encounter.   There are no Patient Instructions on file for this visit.   Signed, Lesleigh Noe, MD  05/04/2021 3:55 PM    [facility]

## 2021-05-06 ENCOUNTER — Ambulatory Visit (INDEPENDENT_AMBULATORY_CARE_PROVIDER_SITE_OTHER): Payer: Medicare HMO | Admitting: Interventional Cardiology

## 2021-05-06 ENCOUNTER — Other Ambulatory Visit: Payer: Self-pay

## 2021-05-06 ENCOUNTER — Encounter: Payer: Self-pay | Admitting: Interventional Cardiology

## 2021-05-06 VITALS — BP 128/58 | HR 78 | Ht 60.0 in | Wt 113.0 lb

## 2021-05-06 DIAGNOSIS — I10 Essential (primary) hypertension: Secondary | ICD-10-CM

## 2021-05-06 DIAGNOSIS — I442 Atrioventricular block, complete: Secondary | ICD-10-CM

## 2021-05-06 DIAGNOSIS — E782 Mixed hyperlipidemia: Secondary | ICD-10-CM

## 2021-05-06 DIAGNOSIS — I6523 Occlusion and stenosis of bilateral carotid arteries: Secondary | ICD-10-CM

## 2021-05-06 DIAGNOSIS — I350 Nonrheumatic aortic (valve) stenosis: Secondary | ICD-10-CM

## 2021-05-06 DIAGNOSIS — Z950 Presence of cardiac pacemaker: Secondary | ICD-10-CM | POA: Diagnosis not present

## 2021-05-06 NOTE — Patient Instructions (Signed)
Medication Instructions:  Your physician recommends that you continue on your current medications as directed. Please refer to the Current Medication list given to you today.  *If you need a refill on your cardiac medications before your next appointment, please call your pharmacy*   Lab Work: None If you have labs (blood work) drawn today and your tests are completely normal, you will receive your results only by: MyChart Message (if you have MyChart) OR A paper copy in the mail If you have any lab test that is abnormal or we need to change your treatment, we will call you to review the results.   Testing/Procedures: Your physician has requested that you have an echocardiogram in 9-12 months. Echocardiography is a painless test that uses sound waves to create images of your heart. It provides your doctor with information about the size and shape of your heart and how well your heart's chambers and valves are working. This procedure takes approximately one hour. There are no restrictions for this procedure.    Follow-Up: At [facility], you and your health needs are our priority.  As part of our continuing mission to provide you with exceptional heart care, we have created designated Provider Care Teams.  These Care Teams include your primary Cardiologist (physician) and Advanced Practice Providers (APPs -  Physician Assistants and Nurse Practitioners) who all work together to provide you with the care you need, when you need it.  We recommend signing up for the patient portal called "MyChart".  Sign up information is provided on this After Visit Summary.  MyChart is used to connect with patients for Virtual Visits (Telemedicine).  Patients are able to view lab/test results, encounter notes, upcoming appointments, etc.  Non-urgent messages can be sent to your provider as well.   To learn more about what you can do with MyChart, go to [email or web address].    Your next appointment:    9-12 month(s)- needs to be after echo  The format for your next appointment:   In Person  Provider:   You may see Tonny Bollman, MD or one of the following Advanced Practice Providers on your designated Care Team:   Tereso Newcomer, PA-C Chelsea Aus, [place]   Other Instructions

## 2021-05-18 ENCOUNTER — Ambulatory Visit (INDEPENDENT_AMBULATORY_CARE_PROVIDER_SITE_OTHER): Payer: Medicare HMO

## 2021-05-18 DIAGNOSIS — I442 Atrioventricular block, complete: Secondary | ICD-10-CM

## 2021-05-18 LAB — CUP PACEART REMOTE DEVICE CHECK
Battery Remaining Percentage: 85 %
Brady Statistic RA Percent Paced: 31 %
Brady Statistic RV Percent Paced: 0 %
Date Time Interrogation Session: 20220621073323
Implantable Lead Implant Date: 20200316
Implantable Lead Implant Date: 20200316
Implantable Lead Location: 753859
Implantable Lead Location: 753860
Implantable Lead Model: 377
Implantable Lead Model: 377
Implantable Lead Serial Number: 81099636
Implantable Lead Serial Number: 81165051
Implantable Pulse Generator Implant Date: 20200316
Lead Channel Impedance Value: 468 Ohm
Lead Channel Impedance Value: 585 Ohm
Lead Channel Pacing Threshold Amplitude: 0.5 V
Lead Channel Pacing Threshold Amplitude: 1 V
Lead Channel Pacing Threshold Pulse Width: 0.4 ms
Lead Channel Pacing Threshold Pulse Width: 0.4 ms
Lead Channel Sensing Intrinsic Amplitude: 1.3 mV
Lead Channel Sensing Intrinsic Amplitude: 8.7 mV
Lead Channel Setting Pacing Amplitude: 2.4 V
Lead Channel Setting Pacing Amplitude: 2.4 V
Lead Channel Setting Pacing Pulse Width: 0.4 ms
Pulse Gen Model: 407145
Pulse Gen Serial Number: 69557134

## 2021-05-31 DIAGNOSIS — L739 Follicular disorder, unspecified: Secondary | ICD-10-CM | POA: Diagnosis not present

## 2021-05-31 DIAGNOSIS — L089 Local infection of the skin and subcutaneous tissue, unspecified: Secondary | ICD-10-CM | POA: Diagnosis not present

## 2021-06-06 DIAGNOSIS — Z950 Presence of cardiac pacemaker: Secondary | ICD-10-CM | POA: Diagnosis not present

## 2021-06-06 DIAGNOSIS — I10 Essential (primary) hypertension: Secondary | ICD-10-CM | POA: Diagnosis not present

## 2021-06-06 DIAGNOSIS — L739 Follicular disorder, unspecified: Secondary | ICD-10-CM | POA: Diagnosis not present

## 2021-06-06 DIAGNOSIS — N1831 Chronic kidney disease, stage 3a: Secondary | ICD-10-CM | POA: Diagnosis not present

## 2021-06-06 DIAGNOSIS — Z8679 Personal history of other diseases of the circulatory system: Secondary | ICD-10-CM | POA: Diagnosis not present

## 2021-06-06 DIAGNOSIS — J302 Other seasonal allergic rhinitis: Secondary | ICD-10-CM | POA: Diagnosis not present

## 2021-06-06 DIAGNOSIS — D649 Anemia, unspecified: Secondary | ICD-10-CM | POA: Diagnosis not present

## 2021-06-06 DIAGNOSIS — E7800 Pure hypercholesterolemia, unspecified: Secondary | ICD-10-CM | POA: Diagnosis not present

## 2021-06-06 DIAGNOSIS — E109 Type 1 diabetes mellitus without complications: Secondary | ICD-10-CM | POA: Diagnosis not present

## 2021-06-07 NOTE — Progress Notes (Signed)
Remote pacemaker transmission.   

## 2021-08-17 ENCOUNTER — Ambulatory Visit (INDEPENDENT_AMBULATORY_CARE_PROVIDER_SITE_OTHER): Payer: Medicare HMO

## 2021-08-17 DIAGNOSIS — I442 Atrioventricular block, complete: Secondary | ICD-10-CM | POA: Diagnosis not present

## 2021-08-17 LAB — CUP PACEART REMOTE DEVICE CHECK
Date Time Interrogation Session: 20220921083914
Implantable Lead Implant Date: 20200316
Implantable Lead Implant Date: 20200316
Implantable Lead Location: 753859
Implantable Lead Location: 753860
Implantable Lead Model: 377
Implantable Lead Model: 377
Implantable Lead Serial Number: 81099636
Implantable Lead Serial Number: 81165051
Implantable Pulse Generator Implant Date: 20200316
Pulse Gen Model: 407145
Pulse Gen Serial Number: 69557134

## 2021-08-24 NOTE — Progress Notes (Signed)
Remote pacemaker transmission.   

## 2021-09-14 DIAGNOSIS — E1065 Type 1 diabetes mellitus with hyperglycemia: Secondary | ICD-10-CM | POA: Diagnosis not present

## 2021-09-29 NOTE — Progress Notes (Signed)
Cardiology Office Note Date:  09/30/2021  Patient ID:  Danielle, Clarke 05-Sep-1941, MRN [id number] PCP:  Catha Gosselin, MD  Cardiologist:  Dr. Katrinka Blazing Electrophysiologist: Dr. Ladona Ridgel    Chief Complaint: over due visit  History of Present Illness: Danielle Clarke is a 80 y.o. female with history of symptomatic bradycardia/sinus arrest w/PPM, syncope, VHD (w/ mod AS), HTN, DM, PVD (carotid, mild disease)  She comes in today to be seen for Dr. Katrinka Blazing, last seen by him June 2020, at that time, device functioning well, no changes were made.  She has been following with Dr. Katrinka Blazing and doing remotes.  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    Hyperlipemia    Hypertension    Dr. Katrinka Blazing ~ 2 years ago   Neck fracture [facility])    july 2013   Osteoporosis    Syncope    Type 1 diabetes mellitus (HCC)     Past Surgical History:  Procedure Laterality Date   ANKLE FRACTURE SURGERY Left 2001   steel plate and 3 screws    ANTERIOR CERVICAL CORPECTOMY N/A 07/31/2014   Procedure: Cervical Four to Cervical Six Corpectomy;  Surgeon: Karn Cassis, MD;  Location: [facility] NEURO ORS;  Service: Neurosurgery;   Laterality: N/A;  C4 to C6 Corpectomy   BREAST SURGERY  1988   CATARACT EXTRACTION W/ INTRAOCULAR LENS  IMPLANT, BILATERAL Bilateral 2011   right and then left   EYE SURGERY     FRACTURE SURGERY     HAMMER TOE SURGERY  1998   I & D EXTREMITY Right 09/27/2018   Procedure: IRRIGATION AND DEBRIDEMENT RIGHT FOOT;  Surgeon: Nadara Mustard, MD;  Location: [facility] OR;  Service: Orthopedics;  Laterality: Right;   JOINT REPLACEMENT     PACEMAKER IMPLANT N/A 02/10/2019   Procedure: PACEMAKER IMPLANT;  Surgeon: Marinus Maw, MD;  Location: [facility] INVASIVE CV LAB;  Service: Cardiovascular;  Laterality: N/A;   TONSILLECTOMY AND ADENOIDECTOMY  1948   TOTAL SHOULDER REPLACEMENT  2010   right shoulder    TUBAL LIGATION  1979   TYMPANOSTOMY TUBE PLACEMENT Bilateral     Current Outpatient Medications  Medication Sig Dispense Refill   Cyanocobalamin (VITAMIN B-12 PO) Take 1 tablet by mouth daily.     HUMALOG KWIKPEN 100 UNIT/ML KwikPen Inject 0-6 Units into the skin 3 (three) times daily as needed (high blood sugar). BS  80-150=2 units 151-200=3 units 201-250=4 units 251-300=5 units 301-350=6 units 351-400=7 units 401-450=8 units     Insulin Glargine (LANTUS) 100 UNIT/ML Solostar Pen Inject 13 Units  into the skin daily. 15 mL 11   loratadine (CLARITIN) 10 MG tablet Take 1 tablet (10 mg total) by mouth daily. 30 tablet 3   montelukast (SINGULAIR) 10 MG tablet Take 1 tablet (10 mg total) by mouth at bedtime. 30 tablet 3   simvastatin (ZOCOR) 40 MG tablet Take 40 mg by mouth daily.     traMADol (ULTRAM) 50 MG tablet Take 50 mg by mouth daily as needed for moderate pain.      VITAMIN D PO Take 1 tablet by mouth daily.     Glucagon (BAQSIMI ONE PACK) 3 MG/DOSE POWD See admin instructions. (Patient not taking: Reported on 09/30/2021)     No current facility-administered medications for this visit.    Allergies:   Cefaclor, Tetracycline, Vancomycin, Augmentin [amoxicillin-pot clavulanate], and Prednisone    Social History:  The patient  reports that she has never smoked. She has never used smokeless tobacco. She reports current alcohol use of about 2.0 standard drinks per week. She reports that she does not use drugs.   Family History:  The patient's family history includes Cancer in her mother; Heart Problems in her father.  ROS:  Please see the history of present illness.    All other systems are reviewed and otherwise negative.   PHYSICAL EXAM:  VS:  BP 140/80   Pulse 75   Ht 5' (1.524 m)   Wt 118 lb 9.6 oz (53.8 kg)   BMI 23.16 kg/m  BMI: Body mass index is 23.16 kg/m. Well nourished, well developed, in no acute distress HEENT: normocephalic, atraumatic Neck: no JVD, carotid bruits or masses Cardiac:  RRR; 2-3/6 high pitched, SM radiates to b/l neck, no rubs, or gallops Lungs:  CTA b/l, no wheezing, rhonchi or rales Abd: soft, nontender MS: no deformity, age appropriate atrophy Ext: no edema, though has R ankle > left she mentions since an ankle fracture many years ago Skin: warm and dry, no rash Neuro:  No gross deficits appreciated Psych: euthymic mood, full affect  PPM site is stable, no tethering or discomfort   EKG:  Done today and reviewed by myself shows  SR 75bpm, no changes from prior  Device interrogation done today and reviewed by myself:  Battery and lead measurements are good A lead output reduced to 2.0V No arrhythmias   04/27/2021: TTE IMPRESSIONS   1. Left ventricular ejection fraction, by estimation, is 60 to 65%. The  left ventricle has normal function. The left ventricle has no regional  wall motion abnormalities. There is mild concentric left ventricular  hypertrophy. Left ventricular diastolic  parameters are indeterminate.   2. Right ventricular systolic function is normal. The right ventricular  size is normal. There is moderately elevated pulmonary artery systolic  pressure.   3. Left atrial size was moderately dilated.   4. The mitral  valve is degenerative. Mild mitral valve regurgitation. The  mean mitral valve gradient is 4.0 mmHg with average heart rate of 73 bpm.  Severe mitral annular calcification.   5. The aortic valve is calcified. There is severe calcifcation of the  aortic valve. There is moderate thickening of the aortic valve. Aortic  valve regurgitation is not visualized. Moderate aortic valve stenosis.  Aortic valve area, by VTI measures 0.65  cm. Aortic valve mean gradient measures 24.3 mmHg. Aortic valve Vmax  measures 3.33 m/s.   Comparison(s): A prior study was performed on 04/02/2020. Prior images  reviewed side by side. Stable aortic valve gradients.    08/03/2020:  Carotid US Summary:  Right Carotid: Velocities in the right ICA are consistent with a 1-39%  stenosis.                 Non-hemodynamically significant plaque <50% noted in the  CCA.   Left Carotid: Velocities in the left ICA are consistent with a 1-39%  stenosis.                Non-hemodynamically significant plaque <50% noted in the  CCA. The                ECA appears >50% stenosed.   Vertebrals:  Bilateral vertebral arteries demonstrate antegrade flow.  Subclavians: Normal flow hemodynamics were seen in bilateral subclavian               arteries.    Recent Labs: 10/14/2020: ALT 16 10/15/2020: BUN 18; Creatinine, Ser 0.92; Hemoglobin 10.1; Platelets 363; Potassium 4.3; Sodium 133  No results found for requested labs within last 8760 hours.   CrCl cannot be calculated (Patient's most recent lab result is older than the maximum 21 days allowed.).   Wt Readings from Last 3 Encounters:  09/30/21 118 lb 9.6 oz (53.8 kg)  05/06/21 113 lb (51.3 kg)  10/28/20 107 lb 3.2 oz (48.6 kg)     Other studies reviewed: Additional studies/records reviewed today include: summarized above  ASSESSMENT AND PLAN:  PPM Intact function  HTN Looks OK  VHD Notable murmur on exam though only Mod AS by her echo this year   Disposition:  F/u with remotes and in clinic with EP in 1 year, sooner if needed.  Current medicines are reviewed at length with the patient today.  The patient did not have any concerns regarding medicines.  Norma Fredrickson, PA-C 09/30/2021 2:04 PM     [facility] [address] [place] [address] [phone] (office)  [phone] (fax)

## 2021-09-30 ENCOUNTER — Other Ambulatory Visit: Payer: Self-pay

## 2021-09-30 ENCOUNTER — Encounter: Payer: Self-pay | Admitting: Physician Assistant

## 2021-09-30 ENCOUNTER — Ambulatory Visit (INDEPENDENT_AMBULATORY_CARE_PROVIDER_SITE_OTHER): Payer: Medicare HMO | Admitting: Physician Assistant

## 2021-09-30 VITALS — BP 140/80 | HR 75 | Ht 60.0 in | Wt 118.6 lb

## 2021-09-30 DIAGNOSIS — I350 Nonrheumatic aortic (valve) stenosis: Secondary | ICD-10-CM | POA: Diagnosis not present

## 2021-09-30 DIAGNOSIS — Z950 Presence of cardiac pacemaker: Secondary | ICD-10-CM

## 2021-09-30 DIAGNOSIS — I442 Atrioventricular block, complete: Secondary | ICD-10-CM | POA: Diagnosis not present

## 2021-09-30 DIAGNOSIS — I10 Essential (primary) hypertension: Secondary | ICD-10-CM | POA: Diagnosis not present

## 2021-09-30 LAB — CUP PACEART INCLINIC DEVICE CHECK
Date Time Interrogation Session: 20221104183352
Implantable Lead Implant Date: 20200316
Implantable Lead Implant Date: 20200316
Implantable Lead Location: 753859
Implantable Lead Location: 753860
Implantable Lead Model: 377
Implantable Lead Model: 377
Implantable Lead Serial Number: 81099636
Implantable Lead Serial Number: 81165051
Implantable Pulse Generator Implant Date: 20200316
Lead Channel Impedance Value: 448 Ohm
Lead Channel Impedance Value: 546 Ohm
Lead Channel Pacing Threshold Amplitude: 0.5 V
Lead Channel Pacing Threshold Amplitude: 0.5 V
Lead Channel Pacing Threshold Amplitude: 0.5 V
Lead Channel Pacing Threshold Amplitude: 0.8 V
Lead Channel Pacing Threshold Amplitude: 0.8 V
Lead Channel Pacing Threshold Amplitude: 0.9 V
Lead Channel Pacing Threshold Pulse Width: 0.4 ms
Lead Channel Pacing Threshold Pulse Width: 0.4 ms
Lead Channel Pacing Threshold Pulse Width: 0.4 ms
Lead Channel Pacing Threshold Pulse Width: 0.4 ms
Lead Channel Pacing Threshold Pulse Width: 0.4 ms
Lead Channel Pacing Threshold Pulse Width: 0.4 ms
Lead Channel Sensing Intrinsic Amplitude: 2.4 mV
Lead Channel Sensing Intrinsic Amplitude: 2.7 mV
Lead Channel Sensing Intrinsic Amplitude: 9.6 mV
Lead Channel Sensing Intrinsic Amplitude: 9.6 mV
Lead Channel Setting Pacing Amplitude: 2 V
Lead Channel Setting Pacing Amplitude: 2.4 V
Lead Channel Setting Pacing Pulse Width: 0.4 ms
Pulse Gen Model: 407145
Pulse Gen Serial Number: 69557134

## 2021-09-30 NOTE — Patient Instructions (Addendum)
Medication Instructions:  ? ?Your physician recommends that you continue on your current medications as directed. Please refer to the Current Medication list given to you today. ? ?*If you need a refill on your cardiac medications before your next appointment, please call your pharmacy* ? ? ?Lab Work: NONE ORDERED  TODAY ? ? ?If you have labs (blood work) drawn today and your tests are completely normal, you will receive your results only by: ?MyChart Message (if you have MyChart) OR ?A paper copy in the mail ?If you have any lab test that is abnormal or we need to change your treatment, we will call you to review the results. ? ? ?Testing/Procedures: NONE ORDERED  TODAY ? ? ? ?Follow-Up: ?At [facility], you and your health needs are our priority.  As part of our continuing mission to provide you with exceptional heart care, we have created designated Provider Care Teams.  These Care Teams include your primary Cardiologist (physician) and Advanced Practice Providers (APPs -  Physician Assistants and Nurse Practitioners) who all work together to provide you with the care you need, when you need it. ? ?We recommend signing up for the patient portal called "MyChart".  Sign up information is provided on this After Visit Summary.  MyChart is used to connect with patients for Virtual Visits (Telemedicine).  Patients are able to view lab/test results, encounter notes, upcoming appointments, etc.  Non-urgent messages can be sent to your provider as well.   ?To learn more about what you can do with MyChart, go to [email or web address].   ? ?Your next appointment:   ?1 year(s) ? ?The format for your next appointment:   ?In Person ? ?Provider:   ?Gregg Taylor, MD ?{ ? ?Other Instructions ? ?

## 2021-11-16 ENCOUNTER — Ambulatory Visit (INDEPENDENT_AMBULATORY_CARE_PROVIDER_SITE_OTHER): Payer: Medicare HMO

## 2021-11-16 DIAGNOSIS — I442 Atrioventricular block, complete: Secondary | ICD-10-CM | POA: Diagnosis not present

## 2021-11-16 LAB — CUP PACEART REMOTE DEVICE CHECK
Date Time Interrogation Session: 20221221075955
Implantable Lead Implant Date: 20200316
Implantable Lead Implant Date: 20200316
Implantable Lead Location: 753859
Implantable Lead Location: 753860
Implantable Lead Model: 377
Implantable Lead Model: 377
Implantable Lead Serial Number: 81099636
Implantable Lead Serial Number: 81165051
Implantable Pulse Generator Implant Date: 20200316
Pulse Gen Model: 407145
Pulse Gen Serial Number: 69557134

## 2021-11-19 ENCOUNTER — Emergency Department (HOSPITAL_COMMUNITY): Payer: Medicare HMO

## 2021-11-19 ENCOUNTER — Other Ambulatory Visit: Payer: Self-pay

## 2021-11-19 ENCOUNTER — Observation Stay (HOSPITAL_COMMUNITY): Payer: Medicare HMO

## 2021-11-19 ENCOUNTER — Encounter (HOSPITAL_COMMUNITY): Payer: Self-pay | Admitting: Emergency Medicine

## 2021-11-19 ENCOUNTER — Inpatient Hospital Stay (HOSPITAL_COMMUNITY)
Admission: EM | Admit: 2021-11-19 | Discharge: 2021-11-25 | DRG: 563 | Disposition: A | Discharge status: Skilled Nursing Facility | Payer: Medicare HMO | Attending: Internal Medicine | Admitting: Internal Medicine

## 2021-11-19 DIAGNOSIS — I455 Other specified heart block: Secondary | ICD-10-CM | POA: Diagnosis present

## 2021-11-19 DIAGNOSIS — R748 Abnormal levels of other serum enzymes: Secondary | ICD-10-CM | POA: Diagnosis present

## 2021-11-19 DIAGNOSIS — R778 Other specified abnormalities of plasma proteins: Secondary | ICD-10-CM | POA: Diagnosis not present

## 2021-11-19 DIAGNOSIS — D72829 Elevated white blood cell count, unspecified: Secondary | ICD-10-CM | POA: Diagnosis present

## 2021-11-19 DIAGNOSIS — S42352D Displaced comminuted fracture of shaft of humerus, left arm, subsequent encounter for fracture with routine healing: Secondary | ICD-10-CM | POA: Diagnosis not present

## 2021-11-19 DIAGNOSIS — E1051 Type 1 diabetes mellitus with diabetic peripheral angiopathy without gangrene: Secondary | ICD-10-CM | POA: Diagnosis not present

## 2021-11-19 DIAGNOSIS — N183 Chronic kidney disease, stage 3 unspecified: Secondary | ICD-10-CM | POA: Diagnosis present

## 2021-11-19 DIAGNOSIS — E785 Hyperlipidemia, unspecified: Secondary | ICD-10-CM | POA: Diagnosis present

## 2021-11-19 DIAGNOSIS — I259 Chronic ischemic heart disease, unspecified: Secondary | ICD-10-CM | POA: Diagnosis present

## 2021-11-19 DIAGNOSIS — S42212A Unspecified displaced fracture of surgical neck of left humerus, initial encounter for closed fracture: Secondary | ICD-10-CM | POA: Diagnosis not present

## 2021-11-19 DIAGNOSIS — D649 Anemia, unspecified: Secondary | ICD-10-CM | POA: Diagnosis present

## 2021-11-19 DIAGNOSIS — K2950 Unspecified chronic gastritis without bleeding: Secondary | ICD-10-CM | POA: Diagnosis present

## 2021-11-19 DIAGNOSIS — E109 Type 1 diabetes mellitus without complications: Secondary | ICD-10-CM | POA: Diagnosis present

## 2021-11-19 DIAGNOSIS — I442 Atrioventricular block, complete: Secondary | ICD-10-CM | POA: Diagnosis not present

## 2021-11-19 DIAGNOSIS — R4020 Unspecified coma: Secondary | ICD-10-CM | POA: Diagnosis present

## 2021-11-19 DIAGNOSIS — N182 Chronic kidney disease, stage 2 (mild): Secondary | ICD-10-CM | POA: Diagnosis not present

## 2021-11-19 DIAGNOSIS — D631 Anemia in chronic kidney disease: Secondary | ICD-10-CM | POA: Diagnosis present

## 2021-11-19 DIAGNOSIS — S42202A Unspecified fracture of upper end of left humerus, initial encounter for closed fracture: Secondary | ICD-10-CM | POA: Diagnosis not present

## 2021-11-19 DIAGNOSIS — W01198A Fall on same level from slipping, tripping and stumbling with subsequent striking against other object, initial encounter: Secondary | ICD-10-CM | POA: Diagnosis present

## 2021-11-19 DIAGNOSIS — R2689 Other abnormalities of gait and mobility: Secondary | ICD-10-CM | POA: Diagnosis not present

## 2021-11-19 DIAGNOSIS — Z79891 Long term (current) use of opiate analgesic: Secondary | ICD-10-CM

## 2021-11-19 DIAGNOSIS — Z888 Allergy status to other drugs, medicaments and biological substances status: Secondary | ICD-10-CM

## 2021-11-19 DIAGNOSIS — R739 Hyperglycemia, unspecified: Secondary | ICD-10-CM | POA: Diagnosis not present

## 2021-11-19 DIAGNOSIS — M1990 Unspecified osteoarthritis, unspecified site: Secondary | ICD-10-CM | POA: Diagnosis present

## 2021-11-19 DIAGNOSIS — M810 Age-related osteoporosis without current pathological fracture: Secondary | ICD-10-CM | POA: Diagnosis present

## 2021-11-19 DIAGNOSIS — W19XXXA Unspecified fall, initial encounter: Secondary | ICD-10-CM

## 2021-11-19 DIAGNOSIS — M6282 Rhabdomyolysis: Secondary | ICD-10-CM | POA: Diagnosis present

## 2021-11-19 DIAGNOSIS — R7989 Other specified abnormal findings of blood chemistry: Secondary | ICD-10-CM | POA: Diagnosis present

## 2021-11-19 DIAGNOSIS — I10 Essential (primary) hypertension: Secondary | ICD-10-CM | POA: Diagnosis present

## 2021-11-19 DIAGNOSIS — I5032 Chronic diastolic (congestive) heart failure: Secondary | ICD-10-CM | POA: Diagnosis not present

## 2021-11-19 DIAGNOSIS — R55 Syncope and collapse: Secondary | ICD-10-CM | POA: Diagnosis present

## 2021-11-19 DIAGNOSIS — H409 Unspecified glaucoma: Secondary | ICD-10-CM | POA: Diagnosis present

## 2021-11-19 DIAGNOSIS — Z96611 Presence of right artificial shoulder joint: Secondary | ICD-10-CM | POA: Diagnosis present

## 2021-11-19 DIAGNOSIS — J321 Chronic frontal sinusitis: Secondary | ICD-10-CM | POA: Diagnosis not present

## 2021-11-19 DIAGNOSIS — Z20822 Contact with and (suspected) exposure to covid-19: Secondary | ICD-10-CM | POA: Diagnosis present

## 2021-11-19 DIAGNOSIS — I350 Nonrheumatic aortic (valve) stenosis: Secondary | ICD-10-CM | POA: Diagnosis not present

## 2021-11-19 DIAGNOSIS — M25512 Pain in left shoulder: Secondary | ICD-10-CM | POA: Diagnosis not present

## 2021-11-19 DIAGNOSIS — S42352A Displaced comminuted fracture of shaft of humerus, left arm, initial encounter for closed fracture: Secondary | ICD-10-CM | POA: Diagnosis present

## 2021-11-19 DIAGNOSIS — Z794 Long term (current) use of insulin: Secondary | ICD-10-CM | POA: Diagnosis not present

## 2021-11-19 DIAGNOSIS — Z87820 Personal history of traumatic brain injury: Secondary | ICD-10-CM

## 2021-11-19 DIAGNOSIS — Z79899 Other long term (current) drug therapy: Secondary | ICD-10-CM

## 2021-11-19 DIAGNOSIS — I69828 Other speech and language deficits following other cerebrovascular disease: Secondary | ICD-10-CM | POA: Diagnosis not present

## 2021-11-19 DIAGNOSIS — I129 Hypertensive chronic kidney disease with stage 1 through stage 4 chronic kidney disease, or unspecified chronic kidney disease: Secondary | ICD-10-CM | POA: Diagnosis not present

## 2021-11-19 DIAGNOSIS — Z9181 History of falling: Secondary | ICD-10-CM | POA: Diagnosis not present

## 2021-11-19 DIAGNOSIS — Z881 Allergy status to other antibiotic agents status: Secondary | ICD-10-CM

## 2021-11-19 DIAGNOSIS — I083 Combined rheumatic disorders of mitral, aortic and tricuspid valves: Secondary | ICD-10-CM | POA: Diagnosis present

## 2021-11-19 DIAGNOSIS — M6281 Muscle weakness (generalized): Secondary | ICD-10-CM | POA: Diagnosis not present

## 2021-11-19 DIAGNOSIS — N181 Chronic kidney disease, stage 1: Secondary | ICD-10-CM | POA: Diagnosis not present

## 2021-11-19 DIAGNOSIS — D638 Anemia in other chronic diseases classified elsewhere: Secondary | ICD-10-CM | POA: Diagnosis present

## 2021-11-19 DIAGNOSIS — R9431 Abnormal electrocardiogram [ECG] [EKG]: Secondary | ICD-10-CM | POA: Diagnosis present

## 2021-11-19 DIAGNOSIS — Z950 Presence of cardiac pacemaker: Secondary | ICD-10-CM

## 2021-11-19 DIAGNOSIS — E782 Mixed hyperlipidemia: Secondary | ICD-10-CM | POA: Diagnosis not present

## 2021-11-19 DIAGNOSIS — E1022 Type 1 diabetes mellitus with diabetic chronic kidney disease: Secondary | ICD-10-CM | POA: Diagnosis not present

## 2021-11-19 DIAGNOSIS — N1832 Chronic kidney disease, stage 3b: Secondary | ICD-10-CM | POA: Diagnosis not present

## 2021-11-19 DIAGNOSIS — Z961 Presence of intraocular lens: Secondary | ICD-10-CM | POA: Diagnosis present

## 2021-11-19 DIAGNOSIS — G9389 Other specified disorders of brain: Secondary | ICD-10-CM | POA: Diagnosis not present

## 2021-11-19 DIAGNOSIS — J323 Chronic sphenoidal sinusitis: Secondary | ICD-10-CM | POA: Diagnosis not present

## 2021-11-19 DIAGNOSIS — R41841 Cognitive communication deficit: Secondary | ICD-10-CM | POA: Diagnosis not present

## 2021-11-19 DIAGNOSIS — Z7984 Long term (current) use of oral hypoglycemic drugs: Secondary | ICD-10-CM

## 2021-11-19 DIAGNOSIS — Z043 Encounter for examination and observation following other accident: Secondary | ICD-10-CM | POA: Diagnosis not present

## 2021-11-19 LAB — CBC WITH DIFFERENTIAL/PLATELET
Abs Immature Granulocytes: 0.05 10*3/uL (ref 0.00–0.07)
Basophils Absolute: 0.1 10*3/uL (ref 0.0–0.1)
Basophils Relative: 0 %
Eosinophils Absolute: 0 10*3/uL (ref 0.0–0.5)
Eosinophils Relative: 0 %
HCT: 36.6 % (ref 36.0–46.0)
Hemoglobin: 11.6 g/dL — ABNORMAL LOW (ref 12.0–15.0)
Immature Granulocytes: 0 %
Lymphocytes Relative: 5 %
Lymphs Abs: 0.6 10*3/uL — ABNORMAL LOW (ref 0.7–4.0)
MCH: 28.4 pg (ref 26.0–34.0)
MCHC: 31.7 g/dL (ref 30.0–36.0)
MCV: 89.7 fL (ref 80.0–100.0)
Monocytes Absolute: 0.6 10*3/uL (ref 0.1–1.0)
Monocytes Relative: 5 %
Neutro Abs: 11.1 10*3/uL — ABNORMAL HIGH (ref 1.7–7.7)
Neutrophils Relative %: 90 %
Platelets: 202 10*3/uL (ref 150–400)
RBC: 4.08 MIL/uL (ref 3.87–5.11)
RDW: 13.5 % (ref 11.5–15.5)
WBC: 12.4 10*3/uL — ABNORMAL HIGH (ref 4.0–10.5)
nRBC: 0 % (ref 0.0–0.2)

## 2021-11-19 LAB — BASIC METABOLIC PANEL
Anion gap: 14 (ref 5–15)
BUN: 19 mg/dL (ref 8–23)
CO2: 23 mmol/L (ref 22–32)
Calcium: 9.2 mg/dL (ref 8.9–10.3)
Chloride: 100 mmol/L (ref 98–111)
Creatinine, Ser: 1.11 mg/dL — ABNORMAL HIGH (ref 0.44–1.00)
GFR, Estimated: 51 mL/min — ABNORMAL LOW (ref >60)
Glucose, Bld: 375 mg/dL — ABNORMAL HIGH (ref 70–99)
Potassium: 5 mmol/L (ref 3.5–5.1)
Sodium: 137 mmol/L (ref 135–145)

## 2021-11-19 LAB — CBG MONITORING, ED
Glucose-Capillary: 334 mg/dL — ABNORMAL HIGH (ref 70–99)
Glucose-Capillary: 350 mg/dL — ABNORMAL HIGH (ref 70–99)

## 2021-11-19 LAB — ECHOCARDIOGRAM COMPLETE
AR max vel: 0.83 cm2
AV Area VTI: 0.71 cm2
AV Area mean vel: 0.76 cm2
AV Mean grad: 38.8 mmHg
AV Peak grad: 62.4 mmHg
Ao pk vel: 3.95 m/s
Area-P 1/2: 2.22 cm2
MV VTI: 1.58 cm2
S' Lateral: 2.2 cm

## 2021-11-19 LAB — GLUCOSE, CAPILLARY
Glucose-Capillary: 255 mg/dL — ABNORMAL HIGH (ref 70–99)
Glucose-Capillary: 249 mg/dL — ABNORMAL HIGH (ref 70–99)

## 2021-11-19 LAB — TROPONIN I (HIGH SENSITIVITY)
Troponin I (High Sensitivity): 18 ng/L — ABNORMAL HIGH (ref <18)
Troponin I (High Sensitivity): 19 ng/L — ABNORMAL HIGH (ref <18)

## 2021-11-19 LAB — CK: Total CK: 949 U/L — ABNORMAL HIGH (ref 38–234)

## 2021-11-19 LAB — RESP PANEL BY RT-PCR (FLU A&B, COVID) ARPGX2
Influenza A by PCR: NEGATIVE
Influenza B by PCR: NEGATIVE
SARS Coronavirus 2 by RT PCR: NEGATIVE

## 2021-11-19 LAB — TSH: TSH: 0.91 u[IU]/mL (ref 0.350–4.500)

## 2021-11-19 LAB — MAGNESIUM: Magnesium: 1.9 mg/dL (ref 1.7–2.4)

## 2021-11-19 MED ORDER — PANTOPRAZOLE SODIUM 40 MG IV SOLR
40.0000 mg | Freq: Once | INTRAVENOUS | Status: AC
  Administered 2021-11-19: 18:00:00 40 mg via INTRAVENOUS
  Filled 2021-11-19: qty 40

## 2021-11-19 MED ORDER — PROCHLORPERAZINE EDISYLATE 10 MG/2ML IJ SOLN
5.0000 mg | Freq: Four times a day (QID) | INTRAMUSCULAR | Status: DC | PRN
  Administered 2021-11-19 – 2021-11-21 (×2): 5 mg via INTRAVENOUS
  Filled 2021-11-19 (×2): qty 2

## 2021-11-19 MED ORDER — INSULIN GLARGINE-YFGN 100 UNIT/ML ~~LOC~~ SOLN
12.0000 [IU] | Freq: Every morning | SUBCUTANEOUS | Status: DC
  Administered 2021-11-19 – 2021-11-24 (×6): 12 [IU] via SUBCUTANEOUS
  Filled 2021-11-19 (×6): qty 0.12

## 2021-11-19 MED ORDER — ONDANSETRON HCL 4 MG/2ML IJ SOLN
4.0000 mg | Freq: Once | INTRAMUSCULAR | Status: AC
  Administered 2021-11-19: 15:00:00 4 mg via INTRAVENOUS
  Filled 2021-11-19: qty 2

## 2021-11-19 MED ORDER — ACETAMINOPHEN 650 MG RE SUPP: 650.0000 mg | Freq: Four times a day (QID) | RECTAL | Status: DC | PRN

## 2021-11-19 MED ORDER — SODIUM CHLORIDE 0.9 % IV SOLN: INTRAVENOUS | Status: DC

## 2021-11-19 MED ORDER — SODIUM CHLORIDE 0.9% FLUSH
3.0000 mL | Freq: Two times a day (BID) | INTRAVENOUS | Status: DC
  Administered 2021-11-21 – 2021-11-23 (×5): 3 mL via INTRAVENOUS

## 2021-11-19 MED ORDER — KETOROLAC TROMETHAMINE 30 MG/ML IJ SOLN
30.0000 mg | Freq: Once | INTRAMUSCULAR | Status: AC
  Administered 2021-11-19: 15:00:00 30 mg via INTRAVENOUS
  Filled 2021-11-19: qty 1

## 2021-11-19 MED ORDER — ACETAMINOPHEN 325 MG PO TABS
650.0000 mg | ORAL_TABLET | Freq: Four times a day (QID) | ORAL | Status: DC | PRN
  Administered 2021-11-25: 09:00:00 650 mg via ORAL
  Filled 2021-11-19 (×2): qty 2

## 2021-11-19 MED ORDER — SODIUM CHLORIDE 0.9 % IV BOLUS
500.0000 mL | Freq: Once | INTRAVENOUS | Status: AC
  Administered 2021-11-19: 15:00:00 500 mL via INTRAVENOUS

## 2021-11-19 MED ORDER — ENOXAPARIN SODIUM 40 MG/0.4ML IJ SOSY
40.0000 mg | PREFILLED_SYRINGE | INTRAMUSCULAR | Status: DC
  Administered 2021-11-19: 18:00:00 40 mg via SUBCUTANEOUS
  Filled 2021-11-19: qty 0.4

## 2021-11-19 MED ORDER — INSULIN ASPART 100 UNIT/ML IJ SOLN
0.0000 [IU] | Freq: Three times a day (TID) | INTRAMUSCULAR | Status: DC
  Administered 2021-11-19: 17:00:00 9 [IU] via SUBCUTANEOUS
  Administered 2021-11-20: 18:00:00 3 [IU] via SUBCUTANEOUS
  Administered 2021-11-20: 08:00:00 2 [IU] via SUBCUTANEOUS
  Administered 2021-11-20: 12:00:00 3 [IU] via SUBCUTANEOUS
  Administered 2021-11-21: 12:00:00 7 [IU] via SUBCUTANEOUS
  Administered 2021-11-21: 10:00:00 3 [IU] via SUBCUTANEOUS

## 2021-11-19 MED ORDER — HYDROCODONE-ACETAMINOPHEN 5-325 MG PO TABS
1.0000 | ORAL_TABLET | ORAL | Status: DC | PRN
  Administered 2021-11-20 – 2021-11-21 (×4): 1 via ORAL
  Administered 2021-11-21 – 2021-11-22 (×3): 2 via ORAL
  Administered 2021-11-23: 21:00:00 1 via ORAL
  Administered 2021-11-23: 07:00:00 2 via ORAL
  Administered 2021-11-23: 21:00:00 1 via ORAL
  Administered 2021-11-24: 07:00:00 2 via ORAL
  Filled 2021-11-19 (×4): qty 1
  Filled 2021-11-19: qty 2
  Filled 2021-11-19: qty 1
  Filled 2021-11-19: qty 2
  Filled 2021-11-19: qty 1
  Filled 2021-11-19: qty 2
  Filled 2021-11-19: qty 1
  Filled 2021-11-19 (×2): qty 2

## 2021-11-19 MED ORDER — SIMVASTATIN 20 MG PO TABS
40.0000 mg | ORAL_TABLET | Freq: Every day | ORAL | Status: DC
  Administered 2021-11-19 – 2021-11-20 (×2): 40 mg via ORAL
  Filled 2021-11-19 (×2): qty 2

## 2021-11-19 MED ORDER — FENTANYL CITRATE PF 50 MCG/ML IJ SOSY
50.0000 ug | PREFILLED_SYRINGE | Freq: Once | INTRAMUSCULAR | Status: AC
  Administered 2021-11-19: 12:00:00 50 ug via INTRAVENOUS
  Filled 2021-11-19: qty 1

## 2021-11-19 MED ORDER — LORATADINE 10 MG PO TABS
10.0000 mg | ORAL_TABLET | Freq: Every day | ORAL | Status: DC
  Administered 2021-11-20 – 2021-11-25 (×6): 10 mg via ORAL
  Filled 2021-11-19 (×6): qty 1

## 2021-11-19 NOTE — Assessment & Plan Note (Addendum)
-  mechanical fall -EDP discussed with ortho, Dr. Sherilyn Dacosta, who will see her tomorrow AM -recommended shoulder sling/immobilizer. No plans for surgery at this time -threw up after fentanyl, will try oral pain medication with hydrocodone -PT/OT to assess as she lives alone at home

## 2021-11-19 NOTE — Assessment & Plan Note (Deleted)
-  new finding -check magnesium -avoid qt prolonging drugs  -place on telemetry -recheck ekg in AM

## 2021-11-19 NOTE — Assessment & Plan Note (Signed)
Continue zocor daily

## 2021-11-19 NOTE — Assessment & Plan Note (Signed)
Baseline: hgb:  10-11 Continue to monitor

## 2021-11-19 NOTE — Progress Notes (Signed)
PT Cancellation Note  Patient Details Name: Danielle Clarke MRN: [id number] DOB: [birth year]   Cancelled Treatment:    Reason Eval/Treat Not Completed: Patient not medically ready. RN reporting pt is having bouts of emesis currently with blood glucose levels in the 350s. Will hold off and plan to follow-up tomorrow as appropriate.   Raymond Gurney, PT, DPT Acute Rehabilitation Services  Pager: [phone] Office: [phone]    Jewel Baize 11/19/2021, 4:33 PM

## 2021-11-19 NOTE — Assessment & Plan Note (Signed)
Appears to be chronic vs. Some reactive component No signs or symptoms of infection Trend and monitor fever curve

## 2021-11-19 NOTE — Assessment & Plan Note (Signed)
Pacemaker interrogated with no recorded events Place on telemetry

## 2021-11-19 NOTE — Progress Notes (Signed)
°  Echocardiogram 2D Echocardiogram has been performed.  Danielle Clarke 11/19/2021, 4:31 PM

## 2021-11-19 NOTE — ED Provider Notes (Signed)
[facility] EMERGENCY DEPARTMENT Provider Note   CSN: [id number] Arrival date & time: 11/19/21  1049     History Chief Complaint  Patient presents with   Marletta Lor    Danielle Clarke is a 80 y.o. female presenting to ED with fall and left shoulder pain.  Reports she slipped walking to the bathroom last night, landed on her left shoulder, no head injury or LOC.  She spent the night on the ground, couldn't get up.  She said this morning when she tried to get up, the pain was severe and she got light headed and "may have passed out."  Currently has large amount of pain in left shoulder, does not radiate, worse with movement, never had before, nothing makes it better, pain is throbbing.  Hx of DM type 1, on lantus 12 units in the AM, and sliding scale TID with meals.  Did not take morning meds or insulin today.  Per care review, hx of bradycardia/sinus arrest w/ PPM, hx of syncope, VHD with moderate AS, HTN, PVD.  Hx of recurring episodes of syncope.  Pacemaker placed in March 2020 Dr Ladona Ridgel, [employer] (serial number [id number].  Last remote check 3 days ago on 11/16/21 with "normal device function" reported on medical records.  HPI     Past Medical History:  Diagnosis Date   Aortic stenosis, mild    Arthritis    Asthmatic bronchitis    with colds per patient   Carotid artery disease (HCC)    40-59% bilateral ICA stenosis   Cataract    Cutaneous abscess of right foot    Glaucoma    Heart murmur    Dr Katrinka Blazing is her  cardiologist.    Hyperlipemia    Hypertension    Dr. 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 05/20/2019   Hypertensive crisis    Hypokalemia    Leukocytosis    Lethargy    Labile blood pressure    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    Acute blood loss anemia    Low grade fever    Hypoglycemia    Multiple closed fractures of ribs of right side    Pleural effusion on right    Hypoalbuminemia due to protein-calorie malnutrition (HCC)    Dysphagia    Traumatic cerebral intraparenchymal hematoma 02/14/2019   Pacemaker    Labile blood glucose    Diabetes mellitus type 2 in nonobese (HCC)    Dyslipidemia    Essential hypertension    Hypernatremia    Anemia of chronic disease    Hypertensive urgency 02/07/2019   Intracerebral hemorrhage (HCC) 02/07/2019   Intracranial bleeding (HCC) 02/06/2019   Elevated troponin    Abnormal EKG    Bronchitis 08/05/2017   Bilateral carotid artery stenosis    Aortic stenosis 08/25/2015   Cervical stenosis of spinal canal 07/31/2014   Essential hypertension, benign 12/30/2013   Hyperlipidemia 12/30/2013  Toe infection 12/30/2013   Anemia 12/30/2013   Acute renal failure (HCC) 12/29/2013   Syncope 01/02/2013   Diabetes mellitus type 1 (HCC) 01/02/2013   Bilateral carotid artery disease (HCC) 01/02/2013   GOITER, MULTINODULAR 02/11/2008    Past Surgical History:  Procedure Laterality Date   ANKLE FRACTURE SURGERY Left 2001   steel plate and 3 screws    ANTERIOR CERVICAL CORPECTOMY N/A 07/31/2014   Procedure: Cervical Four to Cervical Six Corpectomy;  Surgeon: Karn Cassis, MD;  Location: [facility] NEURO ORS;  Service: Neurosurgery;  Laterality: N/A;  C4 to C6 Corpectomy   BREAST SURGERY  1988   CATARACT EXTRACTION W/ INTRAOCULAR LENS  IMPLANT, BILATERAL Bilateral 2011   right and then left   EYE SURGERY     FRACTURE SURGERY     HAMMER TOE SURGERY  1998   I & D EXTREMITY Right  09/27/2018   Procedure: IRRIGATION AND DEBRIDEMENT RIGHT FOOT;  Surgeon: Nadara Mustard, MD;  Location: [facility] OR;  Service: Orthopedics;  Laterality: Right;   JOINT REPLACEMENT     PACEMAKER IMPLANT N/A 02/10/2019   Procedure: PACEMAKER IMPLANT;  Surgeon: Marinus Maw, MD;  Location: [facility] INVASIVE CV LAB;  Service: Cardiovascular;  Laterality: N/A;   TONSILLECTOMY AND ADENOIDECTOMY  1948   TOTAL SHOULDER REPLACEMENT  2010   right shoulder    TUBAL LIGATION  1979   TYMPANOSTOMY TUBE PLACEMENT Bilateral      OB History     Gravida      Para      Term      Preterm      AB      Living  2      SAB      IAB      Ectopic      Multiple      Live Births              Family History  Problem Relation Age of Onset   Cancer Mother    Heart Problems Father     Social History   Tobacco Use   Smoking status: Never   Smokeless tobacco: Never  Vaping Use   Vaping Use: Never used  Substance Use Topics   Alcohol use: Yes    Alcohol/week: 2.0 standard drinks    Types: 2 Glasses of wine per week   Drug use: Never    Home Medications Prior to Admission medications   Medication Sig Start Date End Date Taking? Authorizing Provider  Cyanocobalamin (VITAMIN B-12 PO) Take 1 tablet by mouth daily.    [provider]  Glucagon (BAQSIMI ONE PACK) 3 MG/DOSE POWD See admin instructions. Patient not taking: Reported on 09/30/2021 09/14/21   [provider]  HUMALOG KWIKPEN 100 UNIT/ML KwikPen Inject 0-6 Units into the skin 3 (three) times daily as needed (high blood sugar). BS  80-150=2 units 151-200=3 units 201-250=4 units 251-300=5 units 301-350=6 units 351-400=7 units 401-450=8 units 04/08/19   [provider]  Insulin Glargine (LANTUS) 100 UNIT/ML Solostar Pen Inject 13 Units into the skin daily. 02/28/19   Angiulli, Mcarthur Rossetti, PA-C  loratadine (CLARITIN) 10 MG tablet Take 1 tablet (10 mg total) by mouth daily. 10/17/20   Rai, Ripudeep K, MD   montelukast (SINGULAIR) 10 MG tablet Take 1 tablet (10 mg total) by mouth at bedtime. 10/16/20   Rai, Delene Ruffini, MD  simvastatin (ZOCOR) 40 MG tablet Take 40 mg by mouth daily. 03/20/20   [provider]  traMADol (ULTRAM) 50 MG tablet Take 50 mg by mouth daily as needed for moderate pain.  08/25/20   [provider]  VITAMIN D PO Take 1 tablet by mouth daily.    [provider]    Allergies    Cefaclor, Tetracycline, Vancomycin, Augmentin [amoxicillin-pot clavulanate], and Prednisone  Review of Systems   Review of Systems  Constitutional:  Negative for chills and fever.  Eyes:  Negative for pain and visual disturbance.  Respiratory:  Negative for cough and shortness of breath.   Cardiovascular:  Negative for chest pain and palpitations.  Gastrointestinal:  Negative for abdominal pain and vomiting.  Genitourinary:  Negative for dysuria and hematuria.  Musculoskeletal:  Positive for arthralgias and myalgias.  Skin:  Negative for color change and rash.  Neurological:  Positive for syncope. Negative for seizures, light-headedness, numbness and headaches.  All other systems reviewed and are negative.  Physical Exam Updated Vital Signs BP (!) 160/55    Pulse 94    Temp 97.8 F (36.6 C) (Oral)    Resp 18    SpO2 100%   Physical Exam Constitutional:      General: She is not in acute distress. HENT:     Head: Normocephalic and atraumatic.  Eyes:     Conjunctiva/sclera: Conjunctivae normal.     Pupils: Pupils are equal, round, and reactive to light.  Cardiovascular:     Rate and Rhythm: Normal rate and regular rhythm.     Heart sounds: Murmur heard.  Pulmonary:     Effort: Pulmonary effort is normal. No respiratory distress.  Abdominal:     General: There is no distension.     Tenderness: There is no abdominal tenderness.  Musculoskeletal:     Comments: Ecchymosis and tenderness of left humerus head, limited ROM of the left shoulder 2/2 pain Full ROM of  the left wrist and elbow, no other deformities of the pelvis or extremities  Skin:    General: Skin is warm and dry.  Neurological:     General: No focal deficit present.     Mental Status: She is alert. Mental status is at baseline.  Psychiatric:        Mood and Affect: Mood normal.        Behavior: Behavior normal.    ED Results / Procedures / Treatments   Labs (all labs ordered are listed, but only abnormal results are displayed) Labs Reviewed  BASIC METABOLIC PANEL - Abnormal; Notable for the following components:      Result Value   Glucose, Bld 375 (*)    Creatinine, Ser 1.11 (*)    GFR, Estimated 51 (*)    All other components within normal limits  CBC WITH DIFFERENTIAL/PLATELET - Abnormal; Notable for the following components:   WBC 12.4 (*)    Hemoglobin 11.6 (*)    Neutro Abs 11.1 (*)    Lymphs Abs 0.6 (*)    All other components within normal limits  CK - Abnormal; Notable for the following components:   Total CK 949 (*)    All other components within normal limits  CBG MONITORING, ED - Abnormal; Notable for the following components:   Glucose-Capillary 334 (*)    All other components within normal limits  TROPONIN I (HIGH SENSITIVITY) - Abnormal; Notable for the following components:   Troponin I (High Sensitivity) 18 (*)    All other components within normal limits  RESP PANEL BY RT-PCR (FLU A&B, COVID) [id number]  URINALYSIS, ROUTINE W  REFLEX MICROSCOPIC  TROPONIN I (HIGH SENSITIVITY)    EKG EKG Interpretation  Date/Time:  Saturday November 19 2021 11:24:47 EST Ventricular Rate:  90 PR Interval:  137 QRS Duration: 107 QT Interval:  417 QTC Calculation: 511 R Axis:   19 Text Interpretation: Sinus rhythm Consider right atrial enlargement Prolonged QT interval No sig change from prior tracing Oct 14 2020, no STEMI Confirmed by Octaviano Glow [phone]) on 11/19/2021 11:33:04 AM  Radiology DG Chest 1 View  Result Date: 11/19/2021 CLINICAL DATA:  Fall on  left shoulder. EXAM: CHEST  1 VIEW COMPARISON:  10/14/2020 FINDINGS: Prior right shoulder replacement. Left humeral neck fracture seen as seen on shoulder series. Left pacer in place with leads in the right atrium and right ventricle, unchanged. Heart is normal size. No confluent opacities, effusions or pneumothorax. IMPRESSION: No acute cardiopulmonary disease. Left humeral neck fracture. Electronically Signed   By: Rolm Baptise M.D.   On: 11/19/2021 12:06   CT HEAD WO CONTRAST (5MM)  Result Date: 11/19/2021 CLINICAL DATA:  [place] last night at 0030 hours, passed out twice after falling due to pain in LEFT shoulder EXAM: CT HEAD WITHOUT CONTRAST TECHNIQUE: Contiguous axial images were obtained from the base of the skull through the vertex without intravenous contrast. COMPARISON:  10/14/2020 FINDINGS: Brain: Generalized atrophy. Mild ex vacuo dilatation of the frontal horn LEFT lateral ventricle. Otherwise normal ventricular morphology. No midline shift or mass effect. Encephalomalacia LEFT frontal lobe from old infarct. Additional small old infarct at anterior aspect of LEFT temporal lobe. Remaining brain parenchyma unremarkable. No intracranial hemorrhage, mass lesion, or evidence of acute infarction. Vascular: No extra-axial fluid collections. Skull: Intact Sinuses/Orbits: Chronic opacification of RIGHT frontal sinus. Air-fluid level LEFT sphenoid sinus. Remaining sinuses clear. Concha bullosa of the middle turbinates. 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 No acute intracranial abnormalities. 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 Electronically Signed   By: Lavonia Dana M.D.   On: 11/19/2021 12:58   DG Shoulder Left  Result Date: 11/19/2021 CLINICAL DATA:  Fall onto shoulder, pain EXAM: LEFT SHOULDER - 2+ VIEW COMPARISON:  None. FINDINGS: There is a fracture through the left humeral neck. Mild lateral displacement of the humeral head. Fracture likely extends  through the greater tuberosity. No subluxation or dislocation. IMPRESSION: Mildly displaced and comminuted proximal left humeral/humeral neck fracture. Electronically Signed   By: Rolm Baptise M.D.   On: 11/19/2021 12:05   DG Humerus Left  Result Date: 11/19/2021 CLINICAL DATA:  Fall EXAM: LEFT HUMERUS - 2+ VIEW COMPARISON:  None. FINDINGS: There is a comminuted fracture through the left humeral neck extending through the greater tuberosity. Mildly displaced humeral head laterally. No subluxation or dislocation. IMPRESSION: Mildly comminuted, displaced proximal humeral/humeral neck fracture. Electronically Signed   By: Rolm Baptise M.D.   On: 11/19/2021 12:06    Procedures Procedures   Medications Ordered in ED Medications  insulin glargine-yfgn (SEMGLEE) injection 12 Units (has no administration in time range)  ondansetron (ZOFRAN) injection 4 mg (has no administration in time range)  ketorolac (TORADOL) 30 MG/ML injection 30 mg (has no administration in time range)  sodium chloride 0.9 % bolus 500 mL (has no administration in time range)  fentaNYL (SUBLIMAZE) injection 50 mcg (50 mcg Intravenous Given 11/19/21 1136)    ED Course  I have reviewed the triage vital signs and the nursing notes.  Pertinent labs & imaging results that were available during my care of the patient were reviewed  by me and considered in my medical decision making (see chart for details).  This patient complains of syncope, left shoulder pain. This involves an extensive number of treatment options, and is a complaint that carries with it a high risk of complications and morbidity.  The differential diagnosis includes shoulder fx vs clavicle fx  Neurovascularly intact BP stable here  Near syncope vs syncope - may be vasovagal response to pain, unlikely bradycardia with pacemaker implant.   Hx of moderate Aortic stenosis - last echo June 2022, +murmur on exam, but no active lightheadedness or evidence of flash  pulm edema or cardiogenic shock on exam.  Will also evaluate for anemia, dehydration, ACS, arrhythmia   I ordered, reviewed, and interpreted labs, showing CK 969, Cr 1.11, Glucose 375, trop 18. I ordered medication fentanyl for pain, zofran and toradol for nausea and pain, IV fluids for elevated CK and Cr function I ordered imaging studies which included xray imaging I independently visualized and interpreted imaging which showed proximal humerus fx, no acute intracranial abnormalities, and the monitor tracing which showed sinus rhythm Additional history was obtained from patient's sona t bedside Previous records obtained and reviewed showing outpatient cardiac workup as noted in history above  Consulted ortho - Dr Kennith [facility] tech consult - no acute events last night on device Admitted to hospitalist for observation   Clinical Course as of 11/19/21 1427  Sat Nov 19, 2021  1133 Ecg per my interpretation shows NSR without evidence of heart block, no STEMI, no significant changes from prior tracing Nov 2021, however Qtc mildly prolonged at 511 [MT]  1343 [employer] interrogation shows no evidence arrhythmia or discharge.  Will admit for observation pending trending CK levels, some IV fluids.  Pt nauseated and vomited after fentanyl - unclear if allergy vs opioid reaction.  We'll give some toradol and zofran.  Son now present at bedside. [MT]  1344 I spoke to Dr Mable Fill from [place] who advises an arm sling for stability, patient can f/u in the office with them.   [MT]  86 Pt signed out to Dr Rogers Blocker hospitalist, transition of care [MT]  1408 [name] son at bedside updated, both of them are agreeable with obs stay [MT]    Clinical Course User Index [MT] Birch Farino, Carola Rhine, MD     Final Clinical Impression(s) / ED Diagnoses Final diagnoses:  Closed fracture of proximal end of left humerus, unspecified fracture morphology, initial encounter  Near syncope    Rx / DC Orders ED  Discharge Orders     None        Wyvonnia Dusky, MD 11/19/21 1427

## 2021-11-19 NOTE — Assessment & Plan Note (Signed)
On no medication, elevated while in ED and ? If due to pain Will treat pain and place prn parameters for HTN

## 2021-11-19 NOTE — Assessment & Plan Note (Addendum)
-  followed by endocrinology outpatient -last A1c we have access to was in 09/2020: 9.4 -she seems to be quite brittle as her sugars will be >250 and then will drop quite low in a short amount of time.  -hypoglycemia has been contributing cause of her syncopal episodes.  -continue her long acting insulin at 12 units with sensitive SSI -accuchecks per protocol

## 2021-11-19 NOTE — H&P (Addendum)
History and Physical    Danielle Clarke [id number] DOB: 1941-10-26 DOA: 11/19/2021  PCP: physician at [facility]. (Dr. Rex Kras retired)  Consultants:  cardiology: Dr. Tamala Julian, endocrinology: Dr. Chalmers Cater  Patient coming from:  Home - lives alone   Chief Complaint: syncopal episode with fall and shoulder pain   HPI: Danielle Clarke is a 80 y.o. female with medical history significant of symptomatic bradycardia/sinus arrest s/p PPM, syncope, VHD with moderate AS, HTN, DM, PVD, HLD, CKD stage III, ACD who presented to Ed after syncope with fall.   She went to bed early and woke up around midnight and decided to wrap presents. Around 2:30 am she had picked up something and all of a sudden "passed out." She has had syncopal episodes in the past and is followed by cardiology. She states she has had syncopal episodes due to hypoglycemia. She states when she fell she must have fell onto her left shoulder. When she came to she couldn't really mover her left hand much.  She tried to get up, but wasn't able to get up. She was on the ground for about 6.5 hours until family came by as she couldn't get to her phone to call them. They didn't check her blood sugar, but son states EMS checked and it was over 400.  She denies any tongue biting or urinary incontinence.   Pain is in her left humeral head with some radiation down the arm. If she stays still and doesn't move arm she doesn't really have much pain. Any movement causes pain. She can move her fingers and has sensation intact.   Overall she has been feeling well. Denies any fever/chills, dizziness/lightheadedness, chest pain or palpitations, shortness of breath or cough, stomach pain, N/V/D, dysuria/urgency or frequency, leg swelling or muscle cramping.    ED Course: vitals: afebrile, bp: 185/57, HR: 85, RR:20, oxygen: 100%RA Pertinent labs: wbc: 12.4, hgb: 11.6, troponin: 18>pending, CK: 949, covid/flu: negative, creatinine: 1.11, glucose: 375 CXR: left  humeral neck fx, no acute chest process Left shoulder/humerus: mildly comminuted, displaced proximal humeral/humeral neck fx.  CT head: no acute findings. 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  In ED: given fentanyl which she vomited, 12 units of insulin, then toradol/zofran and 559ml bolus. Ortho was called, Dr. Mable Fill who advised shoulder immobilizer/sling and TRH was asked to admit. Also had pacemaker interrogated-no events recorded.   Review of Systems: As per HPI; otherwise review of systems reviewed and negative.   Ambulatory Status:  Ambulates with cane    Past Medical History:  Diagnosis Date   Aortic stenosis, mild    Arthritis    Asthmatic bronchitis    with colds per patient   Carotid artery disease (HCC)    40-59% bilateral ICA stenosis   Cataract    Cutaneous abscess of right foot    Glaucoma    Heart murmur    Dr Tamala Julian is her  cardiologist.    Hyperlipemia    Hypertension    Dr. Tamala Julian ~ 2 years ago   Neck fracture [facility])    july 2013   Osteoporosis    Syncope    Type 1 diabetes mellitus [facility])     Past Surgical History:  Procedure Laterality Date   ANKLE FRACTURE SURGERY Left 2001   steel plate and 3 screws    ANTERIOR CERVICAL CORPECTOMY N/A 07/31/2014   Procedure: Cervical Four to Cervical Six Corpectomy;  Surgeon: Floyce Stakes, MD;  Location: [facility] NEURO ORS;  Service: Neurosurgery;  Laterality: N/A;  C4 to C6 Corpectomy   BREAST SURGERY  1988   CATARACT EXTRACTION W/ INTRAOCULAR LENS  IMPLANT, BILATERAL Bilateral 2011   right and then left   EYE SURGERY     FRACTURE SURGERY     HAMMER TOE SURGERY  1998   I & D EXTREMITY Right 09/27/2018   Procedure: IRRIGATION AND DEBRIDEMENT RIGHT FOOT;  Surgeon: Nadara Mustard, MD;  Location: [facility] OR;  Service: Orthopedics;  Laterality: Right;   JOINT REPLACEMENT     PACEMAKER IMPLANT N/A 02/10/2019   Procedure: PACEMAKER IMPLANT;  Surgeon: Marinus Maw, MD;   Location: [facility] INVASIVE CV LAB;  Service: Cardiovascular;  Laterality: N/A;   TONSILLECTOMY AND ADENOIDECTOMY  1948   TOTAL SHOULDER REPLACEMENT  2010   right shoulder    TUBAL LIGATION  1979   TYMPANOSTOMY TUBE PLACEMENT Bilateral     Social History   Socioeconomic History   Marital status: Divorced    Spouse name: Not on file   Number of children: 2   Years of education: [employer]   Highest education level: Not on file  Occupational History   Occupation: Retired    Associate Professor: RETIRED  Tobacco Use   Smoking status: Never   Smokeless tobacco: Never  Vaping Use   Vaping Use: Never used  Substance and Sexual Activity   Alcohol use: Yes    Alcohol/week: 2.0 standard drinks    Types: 2 Glasses of wine per week   Drug use: Never   Sexual activity: Never  Other Topics Concern   Not on file  Social History Narrative   Patient lives at home alone.   Caffeine Use:    Social Determinants of Corporate investment banker Strain: Not on file  Food Insecurity: Not on file  Transportation Needs: Not on file  Physical Activity: Not on file  Stress: Not on file  Social Connections: Not on file  Intimate Partner Violence: Not on file    Allergies  Allergen Reactions   Cefaclor Anaphylaxis    Tolerated cephalexin in 2018   Tetracycline Anaphylaxis   Vancomycin Anaphylaxis   Augmentin [Amoxicillin-Pot Clavulanate] Other (See Comments)    Reaction unknown >> Anaphylaxis Has patient had a PCN reaction causing immediate rash, facial/tongue/throat swelling, SOB or lightheadedness with hypotension: Unknown Has patient had a PCN reaction causing severe rash involving mucus membranes or skin necrosis: Unknown Has patient had a PCN reaction that required hospitalization: Unknown Has patient had a PCN reaction occurring within the last 10 years: Unknown If all of the above answers are "NO", then may proceed with Cephalosporin use.    Prednisone Other (See Comments)    Reaction unknown     Family History  Problem Relation Age of Onset   Cancer Mother    Heart Problems Father     Prior to Admission medications   Medication Sig Start Date End Date Taking? Authorizing Provider  Cyanocobalamin (VITAMIN B-12 PO) Take 1 tablet by mouth daily.    [provider]  Glucagon (BAQSIMI ONE PACK) 3 MG/DOSE POWD See admin instructions. Patient not taking: Reported on 09/30/2021 09/14/21   [provider]  HUMALOG KWIKPEN 100 UNIT/ML KwikPen Inject 0-6 Units into the skin 3 (three) times daily as needed (high blood sugar). BS  80-150=2 units 151-200=3 units 201-250=4 units 251-300=5 units 301-350=6 units 351-400=7 units 401-450=8 units 04/08/19   [provider]  Insulin Glargine (LANTUS) 100 UNIT/ML  Solostar Pen Inject 13 Units into the skin daily. 02/28/19   Angiulli, Lavon Paganini, PA-C  loratadine (CLARITIN) 10 MG tablet Take 1 tablet (10 mg total) by mouth daily. 10/17/20   Rai, Ripudeep K, MD  montelukast (SINGULAIR) 10 MG tablet Take 1 tablet (10 mg total) by mouth at bedtime. 10/16/20   Rai, Vernelle Emerald, MD  simvastatin (ZOCOR) 40 MG tablet Take 40 mg by mouth daily. 03/20/20   [provider]  traMADol (ULTRAM) 50 MG tablet Take 50 mg by mouth daily as needed for moderate pain.  08/25/20   [provider]  VITAMIN D PO Take 1 tablet by mouth daily.    [provider]    Physical Exam: Vitals:   11/19/21 1145 11/19/21 1215 11/19/21 1300 11/19/21 1415  BP:   (!) 178/60 (!) 160/55  Pulse: 84 94 89 94  Resp: (!) 23 18 13 18   Temp:      TempSrc:      SpO2: 100% 100% 100% 100%     General:  Appears calm and comfortable and is in NAD Eyes:  PERRL, EOMI, normal lids, iris ENT:  grossly normal hearing, lips & tongue, mmm; dentures  Neck:  no LAD, masses or thyromegaly; no carotid bruits Cardiovascular:  RRR, harsh, loud systolic murmur. No LE edema.  Respiratory:   CTA bilaterally with no wheezes/rales/rhonchi.  Normal  respiratory effort. Abdomen:  soft, NT, ND, NABS Back:   normal alignment, no CVAT Skin:  no rash or induration seen on limited exam. Large ecchymosis over left humeral head  Musculoskeletal:  grossly normal tone BUE/BLE, decreased ROM left shoulder, no bony abnormality. Left humeral head with ttp and pain with any movement. Left hand grip intact 5/5 with intact radial pulses.  Lower extremity:  No LE edema.  Limited foot exam with no ulcerations.  2+ distal pulses. Psychiatric:  grossly normal mood and affect, speech fluent and appropriate, AOx3 Neurologic:  CN 2-12 grossly intact, moves all extremities in coordinated fashion, sensation intact    Radiological Exams on Admission: Independently reviewed - see discussion in A/P where applicable  DG Chest 1 View  Result Date: 11/19/2021 CLINICAL DATA:  Fall on left shoulder. EXAM: CHEST  1 VIEW COMPARISON:  10/14/2020 FINDINGS: Prior right shoulder replacement. Left humeral neck fracture seen as seen on shoulder series. Left pacer in place with leads in the right atrium and right ventricle, unchanged. Heart is normal size. No confluent opacities, effusions or pneumothorax. IMPRESSION: No acute cardiopulmonary disease. Left humeral neck fracture. Electronically Signed   By: Rolm Baptise M.D.   On: 11/19/2021 12:06   CT HEAD WO CONTRAST (5MM)  Result Date: 11/19/2021 CLINICAL DATA:  [place] last night at 0030 hours, passed out twice after falling due to pain in LEFT shoulder EXAM: CT HEAD WITHOUT CONTRAST TECHNIQUE: Contiguous axial images were obtained from the base of the skull through the vertex without intravenous contrast. COMPARISON:  10/14/2020 FINDINGS: Brain: Generalized atrophy. Mild ex vacuo dilatation of the frontal horn LEFT lateral ventricle. Otherwise normal ventricular morphology. No midline shift or mass effect. Encephalomalacia LEFT frontal lobe from old infarct. Additional small old infarct at anterior aspect of LEFT temporal lobe.  Remaining brain parenchyma unremarkable. No intracranial hemorrhage, mass lesion, or evidence of acute infarction. Vascular: No extra-axial fluid collections. Skull: Intact Sinuses/Orbits: Chronic opacification of RIGHT frontal sinus. Air-fluid level LEFT sphenoid sinus. Remaining sinuses clear. Concha bullosa of the middle turbinates. Other: N/A IMPRESSION: Old LEFT frontal and temporal lobe  infarcts. No acute intracranial abnormalities. 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 Electronically Signed   By: Lavonia Dana M.D.   On: 11/19/2021 12:58   DG Shoulder Left  Result Date: 11/19/2021 CLINICAL DATA:  Fall onto shoulder, pain EXAM: LEFT SHOULDER - 2+ VIEW COMPARISON:  None. FINDINGS: There is a fracture through the left humeral neck. Mild lateral displacement of the humeral head. Fracture likely extends through the greater tuberosity. No subluxation or dislocation. IMPRESSION: Mildly displaced and comminuted proximal left humeral/humeral neck fracture. Electronically Signed   By: Rolm Baptise M.D.   On: 11/19/2021 12:05   DG Humerus Left  Result Date: 11/19/2021 CLINICAL DATA:  Fall EXAM: LEFT HUMERUS - 2+ VIEW COMPARISON:  None. FINDINGS: There is a comminuted fracture through the left humeral neck extending through the greater tuberosity. Mildly displaced humeral head laterally. No subluxation or dislocation. IMPRESSION: Mildly comminuted, displaced proximal humeral/humeral neck fracture. Electronically Signed   By: Rolm Baptise M.D.   On: 11/19/2021 12:06    EKG: Independently reviewed.  NSR with rate 90; nonspecific ST changes with no evidence of acute ischemia. Prolonged qt-new from prior ekg.     Labs on Admission: I have personally reviewed the available labs and imaging studies at the time of the admission.  Pertinent labs:  wbc: 12.4,  hgb: 11.6,  troponin: 18>pending,  CK: 949,  covid/flu: negative,  creatinine: 1.11,  glucose: 375  Assessment/Plan Syncope 80  year old presenting with syncopal episode with fall and subsequent left humeral fracture -place in obs on telemetry -she has history of multiple syncopal episodes that have been related to hypoglycemia per patient; however, her BS unknown at time of fall. >400 6.5 hours later with EMS.  -she had syncopal episode prior to PPM that was due to cardiac issues. PPM interrogated with no events overnight.  -history of moderate-moderate severe AS by echo in June, but will place order for repeat echo -QT prolongation on ekg, new finding. Check magnesium, IVF for elevated CK and repeat ekg in AM. Avoiding qt prolonging drugs -check orthostatic vitals  -history of carotid artery disease: check carotid dopplers   Closed comminuted left humeral fracture -mechanical fall -EDP discussed with ortho, Dr. Mable Fill, who will see her tomorrow AM -recommended shoulder sling/immobilizer. No plans for surgery at this time -threw up after fentanyl, will try oral pain medication with hydrocodone. Tolerated tramadol in the past  -PT/OT to assess as she lives alone at home   Prolonged QT interval -new finding -check magnesium -avoid qt prolonging drugs  -place on telemetry -recheck ekg in AM  Elevated CK Secondary to prolonged immobilization on ground after fall Received 500cc bolus and will continue IVF overnight Recheck in AM  Diabetes mellitus type 1 ([place]) -followed by endocrinology outpatient -last A1c we have access to was in 09/2020: 9.4 -she seems to be quite brittle as her sugars will be >250 and then will drop quite low in a short amount of time.  -hypoglycemia has been contributing cause of her syncopal episodes in the past post PPM placement per patient.  -continue her long acting insulin at 12 units with sensitive SSI -accuchecks per protocol   Elevated troponin 18>19, flat No chest pain/palpitations/event on PPM or ST ekg changes Continue to monitor on telemetry   Aortic stenosis -echo in  04/2021 showed moderate-moderate severe disease -check echo with syncopal episode and fall.   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 Some reactive component No signs or symptoms of infection Trend and monitor fever curve   Hyperlipidemia Continue zocor daily   Essential hypertension, benign On no medication, elevated while in ED and ? If due to pain Will treat pain and place prn parameters for HTN   Anemia of chronic disease Baseline: hgb:  10-11 Continue to monitor      There is no height or weight on file to calculate BMI.  Level of care: Telemetry Medical DVT prophylaxis:  Lovenox  Code Status:  Full - confirmed with patient Family Communication: son at bedside: Chip Asmus  Disposition Plan:  The patient is from: home  Anticipated d/c is to: per day team  Patient placed in observation as anticipate less than 2 midnight stay. Requires hospitalization for close monitoring and work up for syncope as well as evaluation for left humeral fracture and evaluation by PT/OT to evaluate safety of returning home since she lives alone.    Patient is currently: stable Consults called: ortho by edp, Dr. Mable Fill  Admission status:  observation    Orma Flaming MD [facility]   How to contact the [facility] Attending or Consulting provider [place] or covering provider during after hours [place], for this patient?  Check the care team in [facility] and look for a) attending/consulting [facility] provider listed and b) the [facility] team listed Log into [email or web address] and use [place]'s universal password to access. If you do not have the password, please contact the hospital operator. Locate the [facility] provider you are looking for under [facility] and page to a number that you  can be directly reached. If you still have difficulty reaching the provider, please page the [facility] (Director on Call) for the Hospitalists listed on amion for assistance.   11/19/2021, 3:21 PM

## 2021-11-19 NOTE — Assessment & Plan Note (Addendum)
-  echo in 04/2021 showed moderate disease - check echo with syncopal episode and fall.

## 2021-11-19 NOTE — Assessment & Plan Note (Addendum)
80 year old presenting with syncopal episode with fall and subsequent left humeral fracture -place in obs on telemetry -she has history of multiple syncopal episodes that have been related to hypoglycemia; however, her BS unknown at time of fall. >400 6.5 hours later with EMS.  -she had syncopal episode prior to PPM that was due to cardiac issues. PPM interrogated with no events overnight.  -history of moderate to moderate severe AS:  place order for echo -QT prolongation on ekg, new finding. Check magnesium, IVF for elevated CK and repeat ekg in AM. Avoiding qt prolonging drugs -check orthostatic vitals  -history of carotid artery disease: check carotid dopplers

## 2021-11-19 NOTE — Progress Notes (Signed)
Orthopedic Tech Progress Note Patient Details:  Danielle Clarke 03-07-1941 [id number]   Ortho Devices Type of Ortho Device: Sling immobilizer Ortho Device/Splint Location: LUE Ortho Device/Splint Interventions: Ordered, Application, Adjustment   Post Interventions Patient Tolerated: Fair Instructions Provided: Care of device, Adjustment of device  Danielle Clarke Carmine Savoy 11/19/2021, 3:38 PM

## 2021-11-19 NOTE — Assessment & Plan Note (Signed)
Baseline appears to be around .98-1.0 Continue to monitor

## 2021-11-19 NOTE — Assessment & Plan Note (Signed)
-  new finding -check magnesium -avoid qt prolonging drugs  -place on telemetry -recheck ekg in AM

## 2021-11-19 NOTE — Assessment & Plan Note (Signed)
Secondary to prolonged immobilization on ground after fall Received 500cc bolus and will continue IVF overnight Recheck in AM

## 2021-11-19 NOTE — Assessment & Plan Note (Signed)
18>19, flat No chest pain/palpitations/event on PPM or ST ekg changes Continue to monitor on telemetry

## 2021-11-19 NOTE — ED Triage Notes (Signed)
Patient BIB [facility] from home. Complaint of a fall last night at approx 1230. Pt state she was walking to bathroom and tripped where the hardwood floor converts to carpet. Pt states she passed out twice after falling d/t pain in the left shoulder. No thinners. VSS.

## 2021-11-19 NOTE — Progress Notes (Signed)
Received call from nurse that patient had one episode of emesis that was bile and some brown tinged color. Went and checked on her and she was resting quietly. Denied any current nausea, stomach pain or chest pain. Gave her one dose of protonix. Also talked to family as she has hx in her chart of GI bleed due to chronic gastritis. They are not aware of any hospitalizations due to this and unaware of any GI issues.  Per chart when in rehab in 12/01/19 had intermittent coffee ground emesis x 1 with no repeat episodes and no GI intervention.  Will continue to monitor at this time. Asked nurse to let us know if any more episodes of emesis.   Dr. Artis Flock [facility]

## 2021-11-20 ENCOUNTER — Observation Stay (HOSPITAL_COMMUNITY): Payer: Medicare HMO

## 2021-11-20 DIAGNOSIS — D638 Anemia in other chronic diseases classified elsewhere: Secondary | ICD-10-CM | POA: Diagnosis not present

## 2021-11-20 DIAGNOSIS — D72829 Elevated white blood cell count, unspecified: Secondary | ICD-10-CM

## 2021-11-20 DIAGNOSIS — R748 Abnormal levels of other serum enzymes: Secondary | ICD-10-CM | POA: Diagnosis not present

## 2021-11-20 DIAGNOSIS — N1832 Chronic kidney disease, stage 3b: Secondary | ICD-10-CM | POA: Diagnosis not present

## 2021-11-20 DIAGNOSIS — R9431 Abnormal electrocardiogram [ECG] [EKG]: Secondary | ICD-10-CM

## 2021-11-20 DIAGNOSIS — I10 Essential (primary) hypertension: Secondary | ICD-10-CM

## 2021-11-20 DIAGNOSIS — I350 Nonrheumatic aortic (valve) stenosis: Secondary | ICD-10-CM

## 2021-11-20 DIAGNOSIS — S42352A Displaced comminuted fracture of shaft of humerus, left arm, initial encounter for closed fracture: Secondary | ICD-10-CM | POA: Diagnosis not present

## 2021-11-20 DIAGNOSIS — E1022 Type 1 diabetes mellitus with diabetic chronic kidney disease: Secondary | ICD-10-CM | POA: Diagnosis not present

## 2021-11-20 DIAGNOSIS — R778 Other specified abnormalities of plasma proteins: Secondary | ICD-10-CM

## 2021-11-20 DIAGNOSIS — N181 Chronic kidney disease, stage 1: Secondary | ICD-10-CM

## 2021-11-20 DIAGNOSIS — E782 Mixed hyperlipidemia: Secondary | ICD-10-CM

## 2021-11-20 DIAGNOSIS — I442 Atrioventricular block, complete: Secondary | ICD-10-CM

## 2021-11-20 DIAGNOSIS — R55 Syncope and collapse: Secondary | ICD-10-CM | POA: Diagnosis not present

## 2021-11-20 LAB — GLUCOSE, CAPILLARY
Glucose-Capillary: 164 mg/dL — ABNORMAL HIGH (ref 70–99)
Glucose-Capillary: 248 mg/dL — ABNORMAL HIGH (ref 70–99)
Glucose-Capillary: 215 mg/dL — ABNORMAL HIGH (ref 70–99)
Glucose-Capillary: 134 mg/dL — ABNORMAL HIGH (ref 70–99)

## 2021-11-20 LAB — CBC
HCT: 28.4 % — ABNORMAL LOW (ref 36.0–46.0)
Hemoglobin: 9.2 g/dL — ABNORMAL LOW (ref 12.0–15.0)
MCH: 28.8 pg (ref 26.0–34.0)
MCHC: 32.4 g/dL (ref 30.0–36.0)
MCV: 88.8 fL (ref 80.0–100.0)
Platelets: 157 10*3/uL (ref 150–400)
RBC: 3.2 MIL/uL — ABNORMAL LOW (ref 3.87–5.11)
RDW: 14 % (ref 11.5–15.5)
WBC: 10.1 10*3/uL (ref 4.0–10.5)
nRBC: 0 % (ref 0.0–0.2)

## 2021-11-20 LAB — BASIC METABOLIC PANEL
Anion gap: 8 (ref 5–15)
BUN: 22 mg/dL (ref 8–23)
CO2: 26 mmol/L (ref 22–32)
Calcium: 8.4 mg/dL — ABNORMAL LOW (ref 8.9–10.3)
Chloride: 104 mmol/L (ref 98–111)
Creatinine, Ser: 1.14 mg/dL — ABNORMAL HIGH (ref 0.44–1.00)
GFR, Estimated: 49 mL/min — ABNORMAL LOW (ref >60)
Glucose, Bld: 216 mg/dL — ABNORMAL HIGH (ref 70–99)
Potassium: 4.4 mmol/L (ref 3.5–5.1)
Sodium: 138 mmol/L (ref 135–145)

## 2021-11-20 LAB — CK: Total CK: 1880 U/L — ABNORMAL HIGH (ref 38–234)

## 2021-11-20 MED ORDER — ENOXAPARIN SODIUM 30 MG/0.3ML IJ SOSY
30.0000 mg | PREFILLED_SYRINGE | INTRAMUSCULAR | Status: DC
  Administered 2021-11-20 – 2021-11-24 (×5): 30 mg via SUBCUTANEOUS
  Filled 2021-11-20 (×5): qty 0.3

## 2021-11-20 MED ORDER — MAGNESIUM SULFATE IN D5W 1-5 GM/100ML-% IV SOLN
1.0000 g | Freq: Once | INTRAVENOUS | Status: AC
  Administered 2021-11-20: 17:00:00 1 g via INTRAVENOUS
  Filled 2021-11-20: qty 100

## 2021-11-20 MED ORDER — SODIUM CHLORIDE 0.45 % IV SOLN
INTRAVENOUS | Status: DC
Start: 2021-11-20 — End: 2021-11-21

## 2021-11-20 NOTE — Care Management Obs Status (Signed)
MEDICARE OBSERVATION STATUS NOTIFICATION   Patient Details  Name: Danielle Clarke MRN: [id number] Date of Birth: 15-May-1941   Medicare Observation Status Notification Given:  Yes    Isaias Cowman, RN 11/20/2021, 3:01 PM

## 2021-11-20 NOTE — Evaluation (Signed)
Physical Therapy Evaluation Patient Details Name: Danielle Clarke MRN: [id number] DOB: 1941-09-09 Today's Date: 11/20/2021  History of Present Illness  Pt is a 80 y.o. female admitted 11/19/21 after syncope and fall resulting in a displaced proximal L humeral neck fx. Head CT negative for acute intracranial changes. Awaiting ortho consult 12/25. PMH includes symptomatic bradycardia/sinus arrest s/p PPM, syncope, VHD with moderate AS, HTN, DM, PVD, HLD, CKD III, ACD.   Clinical Impression  Pt presents with an overall decrease in functional mobility secondary to above. PTA, pt mod indep with SPC, lives alone; reports family nearby but likely not available to assist. Educ on assumed LUE NWB precautions.Today, pt able to tolerate brief bout of standing activity, requiring up to modA for stability; pt denies dizziness with activity, endorses significant LUE pain. Pt would benefit from SNF-level therapies to maximize functional mobility and independence prior to return home.  If pt to return home, would need near 24/7 assist for mobility (pt reports family unable to provide this) and ADL/iADL tasks as pt currently requiring up to [facility] without use of her LUE.  Orthostatic BPs Supine 117/46  Sitting 133/66  Post-standing transfer 116/51     Recommendations for follow up therapy are one component of a multi-disciplinary discharge planning process, led by the attending physician.  Recommendations may be updated based on patient status, additional functional criteria and insurance authorization.  Follow Up Recommendations Skilled nursing-short term rehab (<3 hours/day) (pt requesting HH aides to allow for return home)    Assistance Recommended at Discharge Frequent or constant Supervision/Assistance  Functional Status Assessment Patient has had a recent decline in their functional status and demonstrates the ability to make significant improvements in function in a reasonable and predictable amount  of time.  Equipment Recommendations   (TBD)    Recommendations for Other Services       Precautions / Restrictions Precautions Precautions: Fall Required Braces or Orthoses: Sling Restrictions Weight Bearing Restrictions: Yes LUE Weight Bearing: Non weight bearing Other Position/Activity Restrictions: No ortho consult yet - assuming NWB, limiting motion in sling      Mobility  Bed Mobility Overal bed mobility: Needs Assistance Bed Mobility: Supine to Sit     Supine to sit: Mod assist     General bed mobility comments: ModA for trunk elevation and scooting hips to EOB, pt assisting well with RUE    Transfers Overall transfer level: Needs assistance Equipment used: 1 person hand held assist;Straight cane Transfers: Sit to/from Stand;Bed to chair/wheelchair/BSC Sit to Stand: Min assist   Step pivot transfers: Min assist       General transfer comment: unable to stand on first attempt due to L shoulder pain, minA to scoot forward, minA for RUE HHA to pull to standing; pivotal steps to recliner with SPC and minA for stability; significant increased time to sit secondary to pain, minA for eccentric control.    Ambulation/Gait               General Gait Details: unable to progress ambulation beyond pivotal steps to recliner due to pain  Stairs            Wheelchair Mobility    Modified Rankin (Stroke Patients Only)       Balance Overall balance assessment: Needs assistance Sitting-balance support: No upper extremity supported;Feet unsupported Sitting balance-Leahy Scale: Fair     Standing balance support: Single extremity supported;During functional activity Standing balance-Leahy Scale: Poor Standing balance comment: Reliant on RUE support for balance  Pertinent Vitals/Pain Pain Assessment: Faces Faces Pain Scale: Hurts whole lot Pain Location: LUE Pain Descriptors / Indicators: Grimacing;Guarding Pain  Intervention(s): Limited activity within patient's tolerance;Repositioned;Patient requesting pain meds-RN notified    Home Living Family/patient expects to be discharged to:: Private residence Living Arrangements: Alone Available Help at Discharge: Family;Neighbor;Available PRN/intermittently Type of Home: House Home Access: Stairs to enter Entrance Stairs-Rails: Left Entrance Stairs-Number of Steps: 2-3   Home Layout: One level Home Equipment: Agricultural consultant (2 wheels);Cane - single point Additional Comments: Daughter lives next door but works as Financial controller and is currently gone working. Son's family lives 4 miles away, but does not seem like consistent option for support    Prior Function Prior Level of Function : Independent/Modified Independent             Mobility Comments: Typicallly mod indep with SPC, lives alone. Enjoys caring for Passenger transport manager Dominance        Extremity/Trunk Assessment   Upper Extremity Assessment Upper Extremity Assessment: Generalized weakness;LUE deficits/detail LUE Deficits / Details: immobilized in sling, noted bruising on anterior shoulder; wrist/finger motion WFL although sore LUE: Unable to fully assess due to pain;Unable to fully assess due to immobilization    Lower Extremity Assessment Lower Extremity Assessment: Generalized weakness    Cervical / Trunk Assessment Cervical / Trunk Assessment: Kyphotic  Communication   Communication: No difficulties  Cognition Arousal/Alertness: Awake/alert Behavior During Therapy: WFL for tasks assessed/performed Overall Cognitive Status: Within Functional Limits for tasks assessed                                 General Comments: WFL for simple tasks; question some decreased awareness into deficits and extent of need for assist as pt wanting to return home though no support available        General Comments General comments (skin integrity, edema, etc.): Requires  assist for meal set-up since pt limited to use of RUE. Increased time discussing d/c planning as pt wanting to return home despite no support available    Exercises     Assessment/Plan    PT Assessment Patient needs continued PT services  PT Problem List Decreased strength;Decreased range of motion;Decreased activity tolerance;Decreased balance;Decreased mobility;Decreased knowledge of use of DME;Decreased safety awareness;Decreased knowledge of precautions;Pain       PT Treatment Interventions DME instruction;Gait training;Stair training;Functional mobility training;Therapeutic activities;Therapeutic exercise;Balance training;Patient/family education    PT Goals (Current goals can be found in the Care Plan section)  Acute Rehab PT Goals Patient Stated Goal: "I want to go home to my cat" PT Goal Formulation: With patient Time For Goal Achievement: 12/04/21 Potential to Achieve Goals: Fair    Frequency Min 5X/week   Barriers to discharge Decreased caregiver support      Co-evaluation               AM-PAC PT "6 Clicks" Mobility  Outcome Measure Help needed turning from your back to your side while in a flat bed without using bedrails?: A Lot Help needed moving from lying on your back to sitting on the side of a flat bed without using bedrails?: A Lot Help needed moving to and from a bed to a chair (including a wheelchair)?: A Little Help needed standing up from a chair using your arms (e.g., wheelchair or bedside chair)?: A Little Help needed to walk in hospital room?: A Lot Help needed climbing  3-5 steps with a railing? : A Lot 6 Click Score: 14    End of Session Equipment Utilized During Treatment: Other (comment) (LUE sling) Activity Tolerance: Patient tolerated treatment well;Patient limited by pain Patient left: in chair;with call bell/phone within reach;with chair alarm set Nurse Communication: Mobility status PT Visit Diagnosis: Other abnormalities of gait and  mobility (R26.89);Muscle weakness (generalized) (M62.81);Pain Pain - Right/Left: Left Pain - part of body: Shoulder;Arm    Time: 9758-8325 PT Time Calculation (min) (ACUTE ONLY): 28 min   Charges:   PT Evaluation $PT Eval Moderate Complexity: 1 Mod        Ina Homes, PT, DPT Acute Rehabilitation Services  Pager [phone] Office [phone]  Malachy Chamber 11/20/2021, 8:51 AM

## 2021-11-20 NOTE — Progress Notes (Addendum)
PROGRESS NOTE  Danielle Clarke  ZOX:[id number] DOB: 02-05-1941 DOA: 11/19/2021 PCP: Catha Gosselin, MD   Brief Narrative: Danielle Clarke is a 80 y.o. female with a history of sinus arrest, heart block s/p PPM, moderate AS, HTN, T1DM, PVD, HLD, stage IIIb CKD who presented to the ED 12/24 after falling at home. She tripped on the carpet around 12:30am, striking her left shoulder and had immediate severe pain there. As she tried to get up, she was too weak to do so without being able to use the left arm and the pain caused her to pass out. She ended up remaining on the ground unable to get to her phone until her son discovered her about 6.5 hours later. In the ED she was afebrile, hypertensive with CK elevated to 949, SCr 1.11, glucose 375. WBC 12.4k. Pacer interrogation yielded no events. 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 XR confirmed mildly comminuted, displaced proximal humerus fracture for which orthopedics was consulted. The arm was placed in a sling, fentanyl given for pain which caused vomiting, and insulin was administered on admission. Despite IVF overnight, CK has risen to 1880. Echocardiogram revealed vigorous LV systolic function, worsening, now severe, aortic stenosis, mild-moderate mitral regurgitation and stenosis. Physical therapy has recommended SNF placement for rehabilitation due to impairments.   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  - PT/OT consulted, SNF currently recommended  Closed displaced, comminuted left humerus fracture:  - Orthopedics recommending sling, formal consult pending. Continue pain control, trial oral hydrocodone due to vomiting with fentanyl and renal impairment.   Syncope: Syncope does not appear cardiogenic at this time with normal interrogation of pacemaker and alternative explanation (severe acute pain). Though valvular heart disease appears to have worsened, would not attribute this event to that.  - Continue telemetry - Note 1-39% carotid stenosis on U/S Sept 2021.   Prolonged QT interval:  - Optimize K, Mg. Give 1g Mg this AM. - Avoid/minimize provocative medications - Continue telemetry, checked this AM improved at QTc   Severe aortic stenosis:  Note wide pulse pressure and loud murmur. Has no symptoms of angina or CHF, and doubt this presentation is related to AS. - Needs cardiology follow up vs. inpatient evaluation.   Mild rhabdomyolysis: Due to prolonged immobility and fracture.  - Continue IVF, trend CK.   T1DM: Brittle with patient-endorsed plan for permissive hyperglycemia due to severe symptoms with hypoglycemia. HbA1c 9.4%. - Continue basal-bolus insulin as ordered.  - Follow up with Dr. Talmage Nap after discharge.  Demand myocardial ischemia: Very mild troponin elevated (hs-Tn 18 > 19) without ischemic ST segment changes on ECG nor chest pain. No inpatient work up currently planned.   CHB s/p PPM 2020  Stage IIIb CKD:  - Monitor with continued IVF. Baseline appears ~1.   Leukocytosis: Without fever or nidus of infection. Likely reactive to trauma, resolved. - Monitor off abx at this time.   HLD:  -  Continue statin at discharge  HTN:  - Continue prn medication  Anemia of chronic disease: No bleeding noted at this time.  - Monitor for stability (some element of hemodilution with IVF suspected).   DVT prophylaxis: Lovenox, decrease to 30mg   q24h Code Status: Full Family Communication: None at bedside Disposition Plan:  Status is: Observation  The patient remains OBS appropriate per UR.   Consultants:  Orthopedics  Procedures:  None  Antimicrobials: None   Subjective: Pain in left shoulder with any movement. Otherwise, not bad. Denies recent or current chest pain, shortness of breath, palpitations, orthopnea or leg swelling.   Objective: Vitals:   11/19/21 1950 11/20/21 0507 11/20/21 0804 11/20/21 1116  BP: (!) 122/40 (!) 135/41 (!) 120/40 110/89  Pulse: 96 90 86 87  Resp: 20 (!) 22 20 18   Temp: 99.2 F (37.3 C) 98.6 F (37 C) 98.3 F (36.8 C) 98.3 F (36.8 C)  TempSrc: Oral Oral Oral Oral  SpO2: 98% 98% 96% 96%  Weight:  54 kg    Height:        Intake/Output Summary (Last 24 hours) at 11/20/2021 1412 Last data filed at 11/20/2021 1005 Gross per 24 hour  Intake 1363.52 ml  Output --  Net 1363.52 ml   Filed Weights   11/19/21 1741 11/20/21 0507  Weight: 52 kg 54 kg    Gen: Elderly small female in no acute distress Pulm: Non-labored breathing room air. Clear to auscultation bilaterally.  CV: Regular rate and rhythm. II/VI harsh holosystolic murmur throughout precordium, mostly at base. No JVD, no pitting pedal edema. GI: Abdomen soft, non-tender, non-distended, with normoactive bowel sounds. No organomegaly or masses felt. Ext: LUE with warm, dry hand, brisk cap refill, intact sensation and 5/5/ strength at elbow, and distally. ROM severely diminished due to pain at shoulder with diffuse tenderness to palpation. No skin tenting. Skin: No rashes, lesions or ulcers Neuro: Alert and oriented. No focal neurological deficits. Psych: Judgement and insight appear normal. Mood & affect appropriate.   Data Reviewed: I have personally reviewed following labs and imaging studies  CBC: Recent Labs  Lab 11/19/21 1115 11/20/21 0314  WBC 12.4* 10.1  NEUTROABS 11.1*  --   HGB 11.6* 9.2*  HCT 36.6 28.4*   MCV 89.7 88.8  PLT 202 [id number]   Basic Metabolic Panel: Recent Labs  Lab 11/19/21 1115 11/19/21 1500 11/20/21 0314  NA 137  --  138  K 5.0  --  4.4  CL 100  --  104  CO2 23  --  26  GLUCOSE 375*  --  216*  BUN 19  --  22  CREATININE 1.11*  --  1.14*  CALCIUM 9.2  --  8.4*  MG  --  1.9  --    GFR: Estimated Creatinine Clearance: 28.7 mL/min (A) (by C-G formula based on SCr of 1.14 mg/dL (H)). Liver Function Tests: No results for input(s): AST, ALT, ALKPHOS, BILITOT, PROT, ALBUMIN in the last 168 hours. No results for input(s): LIPASE, AMYLASE in the last 168 hours. No results for input(s): AMMONIA in the last 168 hours. Coagulation Profile: No results for input(s): INR, PROTIME in the last 168 hours. Cardiac Enzymes: Recent Labs  Lab 11/19/21 1115 11/20/21 0314  CKTOTAL 949* 1,880*   BNP (last 3 results) No results for input(s): PROBNP in the last 8760 hours. HbA1C: No results for input(s): HGBA1C in the last 72 hours. CBG: Recent Labs  Lab 11/19/21 1621 11/19/21 1737 11/19/21 2135 11/20/21 [id number]  11/20/21 1123  GLUCAP 350* 255* 249* 164* 248*   Lipid Profile: No results for input(s): CHOL, HDL, LDLCALC, TRIG, CHOLHDL, LDLDIRECT in the last 72 hours. Thyroid Function Tests: Recent Labs    11/19/21 1519  TSH 0.910   Anemia Panel: No results for input(s): VITAMINB12, FOLATE, FERRITIN, TIBC, IRON, RETICCTPCT in the last 72 hours. Urine analysis:    Component Value Date/Time   COLORURINE YELLOW 10/14/2020 1216   APPEARANCEUR CLEAR 10/14/2020 1216   LABSPEC 1.009 10/14/2020 1216   PHURINE 6.0 10/14/2020 1216   GLUCOSEU >=500 (A) 10/14/2020 1216   HGBUR NEGATIVE 10/14/2020 1216   BILIRUBINUR NEGATIVE 10/14/2020 1216   KETONESUR 5 (A) 10/14/2020 1216   PROTEINUR NEGATIVE 10/14/2020 1216   UROBILINOGEN 0.2 10/07/2014 1808   NITRITE NEGATIVE 10/14/2020 1216   LEUKOCYTESUR NEGATIVE 10/14/2020 1216   Recent Results (from the past 240 hour(s))  Resp Panel by  RT-PCR (Flu A&B, Covid) Nasopharyngeal Swab     Status: None   Collection Time: 11/19/21 11:15 AM   Specimen: Nasopharyngeal Swab; Nasopharyngeal(NP) swabs in vial transport medium  Result Value Ref Range Status   SARS Coronavirus 2 by RT PCR NEGATIVE NEGATIVE Final    Comment: (NOTE) SARS-CoV-2 target nucleic acids are NOT DETECTED.  The SARS-CoV-2 RNA is generally detectable in upper respiratory specimens during the acute phase of infection. The lowest concentration of SARS-CoV-2 viral copies this assay can detect is 138 copies/mL. A negative result does not preclude SARS-Cov-2 infection and should not be used as the sole basis for treatment or other patient management decisions. A negative result may occur with  improper specimen collection/handling, submission of specimen other than nasopharyngeal swab, presence of viral mutation(s) within the areas targeted by this assay, and inadequate number of viral copies(<138 copies/mL). A negative result must be combined with clinical observations, patient history, and epidemiological information. The expected result is Negative.  Fact Sheet for Patients:  BloggerCourse.comhttps://www.fda.gov/media/152166/download  Fact Sheet for Healthcare Providers:  SeriousBroker.ithttps://www.fda.gov/media/152162/download  This test is no t yet approved or cleared by the [place] FDA and  has been authorized for detection and/or diagnosis of SARS-CoV-2 by FDA under an Emergency Use Authorization (EUA). This EUA will remain  in effect (meaning this test can be used) for the duration of the COVID-19 declaration under Section 564(b)(1) of the Act, 21 U.S.C.section 360bbb-3(b)(1), unless the authorization is terminated  or revoked sooner.       Influenza A by PCR NEGATIVE NEGATIVE Final   Influenza B by PCR NEGATIVE NEGATIVE Final    Comment: (NOTE) The Xpert Xpress SARS-CoV-2/FLU/RSV plus assay is intended as an aid in the diagnosis of influenza from Nasopharyngeal swab  specimens and should not be used as a sole basis for treatment. Nasal washings and aspirates are unacceptable for Xpert Xpress SARS-CoV-2/FLU/RSV testing.  Fact Sheet for Patients: BloggerCourse.comhttps://www.fda.gov/media/152166/download  Fact Sheet for Healthcare Providers: SeriousBroker.ithttps://www.fda.gov/media/152162/download  This test is not yet approved or cleared by the [place] FDA and has been authorized for detection and/or diagnosis of SARS-CoV-2 by FDA under an Emergency Use Authorization (EUA). This EUA will remain in effect (meaning this test can be used) for the duration of the COVID-19 declaration under Section 564(b)(1) of the Act, 21 U.S.C. section 360bbb-3(b)(1), unless the authorization is terminated or revoked.  Performed at [facility], [address] [place], [place] [address]       Radiology Studies: DG Chest 1 View  Result Date: 11/19/2021 CLINICAL DATA:  Fall on left shoulder. EXAM:  CHEST  1 VIEW COMPARISON:  10/14/2020 FINDINGS: Prior right shoulder replacement. Left humeral neck fracture seen as seen on shoulder series. Left pacer in place with leads in the right atrium and right ventricle, unchanged. Heart is normal size. No confluent opacities, effusions or pneumothorax. IMPRESSION: No acute cardiopulmonary disease. Left humeral neck fracture. Electronically Signed   By: Rolm Baptise M.D.   On: 11/19/2021 12:06   CT HEAD WO CONTRAST (5MM)  Result Date: 11/19/2021 CLINICAL DATA:  [place] last night at 0030 hours, passed out twice after falling due to pain in LEFT shoulder EXAM: CT HEAD WITHOUT CONTRAST TECHNIQUE: Contiguous axial images were obtained from the base of the skull through the vertex without intravenous contrast. COMPARISON:  10/14/2020 FINDINGS: Brain: Generalized atrophy. Mild ex vacuo dilatation of the frontal horn LEFT lateral ventricle. Otherwise normal ventricular morphology. No midline shift or mass effect. Encephalomalacia LEFT frontal lobe from old  infarct. Additional small old infarct at anterior aspect of LEFT temporal lobe. Remaining brain parenchyma unremarkable. No intracranial hemorrhage, mass lesion, or evidence of acute infarction. Vascular: No extra-axial fluid collections. Skull: Intact Sinuses/Orbits: Chronic opacification of RIGHT frontal sinus. Air-fluid level LEFT sphenoid sinus. Remaining sinuses clear. Concha bullosa of the middle turbinates. 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 No acute intracranial abnormalities. 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 Electronically Signed   By: Lavonia Dana M.D.   On: 11/19/2021 12:58   DG Shoulder Left  Result Date: 11/19/2021 CLINICAL DATA:  Fall onto shoulder, pain EXAM: LEFT SHOULDER - 2+ VIEW COMPARISON:  None. FINDINGS: There is a fracture through the left humeral neck. Mild lateral displacement of the humeral head. Fracture likely extends through the greater tuberosity. No subluxation or dislocation. IMPRESSION: Mildly displaced and comminuted proximal left humeral/humeral neck fracture. Electronically Signed   By: Rolm Baptise M.D.   On: 11/19/2021 12:05   DG Humerus Left  Result Date: 11/19/2021 CLINICAL DATA:  Fall EXAM: LEFT HUMERUS - 2+ VIEW COMPARISON:  None. FINDINGS: There is a comminuted fracture through the left humeral neck extending through the greater tuberosity. Mildly displaced humeral head laterally. No subluxation or dislocation. IMPRESSION: Mildly comminuted, displaced proximal humeral/humeral neck fracture. Electronically Signed   By: Rolm Baptise M.D.   On: 11/19/2021 12:06   ECHOCARDIOGRAM COMPLETE  Result Date: 11/19/2021    ECHOCARDIOGRAM REPORT   Patient Name:   Danielle Clarke Rasheed Date of Exam: 11/19/2021 Medical Rec #:  [id number]          Height:       60.0 in Accession #:    [id number]         Weight:       118.6 lb Date of Birth:  [birth year]          BSA:          1.495 m Patient Age:    53 years           BP:            160/55 mmHg Patient Gender: F                  HR:           94 bpm. Exam Location:  Inpatient Procedure: 2D Echo, Cardiac Doppler and Color Doppler Indications:    Syncope  History:        Patient has prior history of Echocardiogram examinations, most  recent 04/27/2021. Defibrillator, Aortic Valve Disease and Mitral                 Valve Disease; Risk Factors:Hypertension and Diabetes. PVD. CKD.  Sonographer:    Clayton Lefort RDCS (AE) Referring Phys: [id number] [facility]  Sonographer Comments: Suboptimal subcostal window. Patients left arm immobilized in sling. IMPRESSIONS  1. Left ventricular ejection fraction, by estimation, is 65 to 70%. The left ventricle has normal function. Left ventricular endocardial border not optimally defined to evaluate regional wall motion. There is mild concentric left ventricular hypertrophy. Left ventricular diastolic parameters are consistent with Grade I diastolic dysfunction (impaired relaxation). Elevated left ventricular end-diastolic pressure.  2. RV-RA gradient 35 mmHg suggests at least mildly increased PASP. Right ventricular systolic function is normal. The right ventricular size is normal.  3. The mitral valve is abnormal. Mild to moderate mitral valve regurgitation. Mild to moderate mitral stenosis. The mean mitral valve gradient is 7.0 mmHg. Moderate mitral annular calcification.  4. The aortic valve has an indeterminant number of cusps. There is moderate calcification of the aortic valve. Aortic valve regurgitation is not visualized. Severe aortic valve stenosis. Aortic valve mean gradient measures 38.8 mmHg.  5. Unable to estimate CVP. Comparison(s): Prior images reviewed side by side. LVEF vigorous. Aortic stenosis more clearly in severe range, although valve not well visualized. Mild to moderate mitral stenosis and regurgitation as well. FINDINGS  Left Ventricle: Left ventricular ejection fraction, by estimation, is 65 to 70%. The left ventricle has  normal function. Left ventricular endocardial border not optimally defined to evaluate regional wall motion. The left ventricular internal cavity size was normal in size. There is mild concentric left ventricular hypertrophy. Left ventricular diastolic parameters are consistent with Grade I diastolic dysfunction (impaired relaxation). Elevated left ventricular end-diastolic pressure. Right Ventricle: RV-RA gradient 35 mmHg suggests at least mildly increased PASP. The right ventricular size is normal. No increase in right ventricular wall thickness. Right ventricular systolic function is normal. Left Atrium: Left atrial size was normal in size. Right Atrium: Right atrial size was normal in size. Pericardium: There is no evidence of pericardial effusion. Mitral Valve: The mitral valve is abnormal. There is mild thickening of the mitral valve leaflet(s). There is moderate calcification of the mitral valve leaflet(s). Moderate mitral annular calcification. Mild to moderate mitral valve regurgitation. Mild to moderate mitral valve stenosis. MV peak gradient, 16.8 mmHg. The mean mitral valve gradient is 7.0 mmHg. Tricuspid Valve: The tricuspid valve is grossly normal. Tricuspid valve regurgitation is mild. Aortic Valve: The aortic valve has an indeterminant number of cusps. There is moderate calcification of the aortic valve. Aortic valve regurgitation is not visualized. Severe aortic stenosis is present. Aortic valve mean gradient measures 38.8 mmHg. Aortic valve peak gradient measures 62.4 mmHg. Aortic valve area, by VTI measures 0.71 cm. Pulmonic Valve: The pulmonic valve was not well visualized. Pulmonic valve regurgitation is not visualized. Aorta: The aortic root is normal in size and structure. Venous: Unable to estimate CVP. The inferior vena cava was not well visualized. IAS/Shunts: No atrial level shunt detected by color flow Doppler. Additional Comments: A device lead is visualized.  LEFT VENTRICLE PLAX 2D  LVIDd:         2.90 cm   Diastology LVIDs:         2.20 cm   LV e' medial:    5.22 cm/s LV PW:         1.60 cm   LV E/e' medial:  19.3 LV  IVS:        1.30 cm   LV e' lateral:   6.96 cm/s LVOT diam:     1.90 cm   LV E/e' lateral: 14.5 LV SV:         59 LV SV Index:   39 LVOT Area:     2.84 cm  RIGHT VENTRICLE RV Basal diam:  3.00 cm RV S prime:     14.60 cm/s TAPSE (M-mode): 1.7 cm LEFT ATRIUM             Index        RIGHT ATRIUM           Index LA diam:        3.10 cm 2.07 cm/m   RA Area:     10.20 cm LA Vol (A2C):   28.2 ml 18.86 ml/m  RA Volume:   20.30 ml  13.58 ml/m LA Vol (A4C):   33.3 ml 22.28 ml/m LA Biplane Vol: 31.8 ml 21.27 ml/m  AORTIC VALVE AV Area (Vmax):    0.83 cm AV Area (Vmean):   0.76 cm AV Area (VTI):     0.71 cm AV Vmax:           395.00 cm/s AV Vmean:          294.400 cm/s AV VTI:            0.828 m AV Peak Grad:      62.4 mmHg AV Mean Grad:      38.8 mmHg LVOT Vmax:         115.00 cm/s LVOT Vmean:        78.700 cm/s LVOT VTI:          0.208 m LVOT/AV VTI ratio: 0.25  AORTA Ao Root diam: 3.10 cm MITRAL VALVE                TRICUSPID VALVE MV Area (PHT): 2.22 cm     TR Peak grad:   35.0 mmHg MV Area VTI:   1.58 cm     TR Vmax:        296.00 cm/s MV Peak grad:  16.8 mmHg MV Mean grad:  7.0 mmHg     SHUNTS MV Vmax:       2.05 m/s     Systemic VTI:  0.21 m MV Vmean:      120.0 cm/s   Systemic Diam: 1.90 cm MV Decel Time: 342 msec MV E velocity: 101.00 cm/s MV A velocity: 183.00 cm/s MV E/A ratio:  0.55 Rozann Lesches MD Electronically signed by Rozann Lesches MD Signature Date/Time: 11/19/2021/4:39:51 PM    Final     Scheduled Meds:  enoxaparin (LOVENOX) injection  40 mg Subcutaneous Q24H   insulin aspart  0-9 Units Subcutaneous TID WC   insulin glargine-yfgn  12 Units Subcutaneous q morning   loratadine  10 mg Oral Daily   sodium chloride flush  3 mL Intravenous Q12H   Continuous Infusions:   LOS: 0 days   Time spent: 25 minutes.  Patrecia Pour, MD [facility] [email or web address] 11/20/2021, 2:12 PM

## 2021-11-21 DIAGNOSIS — N1832 Chronic kidney disease, stage 3b: Secondary | ICD-10-CM | POA: Diagnosis present

## 2021-11-21 DIAGNOSIS — R55 Syncope and collapse: Secondary | ICD-10-CM | POA: Diagnosis present

## 2021-11-21 DIAGNOSIS — I129 Hypertensive chronic kidney disease with stage 1 through stage 4 chronic kidney disease, or unspecified chronic kidney disease: Secondary | ICD-10-CM | POA: Diagnosis present

## 2021-11-21 DIAGNOSIS — E1022 Type 1 diabetes mellitus with diabetic chronic kidney disease: Secondary | ICD-10-CM | POA: Diagnosis present

## 2021-11-21 DIAGNOSIS — I455 Other specified heart block: Secondary | ICD-10-CM | POA: Diagnosis present

## 2021-11-21 DIAGNOSIS — M6282 Rhabdomyolysis: Secondary | ICD-10-CM | POA: Diagnosis present

## 2021-11-21 DIAGNOSIS — S42212A Unspecified displaced fracture of surgical neck of left humerus, initial encounter for closed fracture: Secondary | ICD-10-CM | POA: Diagnosis present

## 2021-11-21 DIAGNOSIS — Z794 Long term (current) use of insulin: Secondary | ICD-10-CM | POA: Diagnosis not present

## 2021-11-21 DIAGNOSIS — D631 Anemia in chronic kidney disease: Secondary | ICD-10-CM | POA: Diagnosis present

## 2021-11-21 DIAGNOSIS — D72829 Elevated white blood cell count, unspecified: Secondary | ICD-10-CM | POA: Diagnosis present

## 2021-11-21 DIAGNOSIS — I442 Atrioventricular block, complete: Secondary | ICD-10-CM | POA: Diagnosis present

## 2021-11-21 DIAGNOSIS — I259 Chronic ischemic heart disease, unspecified: Secondary | ICD-10-CM | POA: Diagnosis present

## 2021-11-21 DIAGNOSIS — D638 Anemia in other chronic diseases classified elsewhere: Secondary | ICD-10-CM | POA: Diagnosis not present

## 2021-11-21 DIAGNOSIS — R778 Other specified abnormalities of plasma proteins: Secondary | ICD-10-CM | POA: Diagnosis not present

## 2021-11-21 DIAGNOSIS — W01198A Fall on same level from slipping, tripping and stumbling with subsequent striking against other object, initial encounter: Secondary | ICD-10-CM | POA: Diagnosis present

## 2021-11-21 DIAGNOSIS — Z950 Presence of cardiac pacemaker: Secondary | ICD-10-CM | POA: Diagnosis not present

## 2021-11-21 DIAGNOSIS — Z87820 Personal history of traumatic brain injury: Secondary | ICD-10-CM | POA: Diagnosis not present

## 2021-11-21 DIAGNOSIS — I5032 Chronic diastolic (congestive) heart failure: Secondary | ICD-10-CM | POA: Diagnosis present

## 2021-11-21 DIAGNOSIS — I350 Nonrheumatic aortic (valve) stenosis: Secondary | ICD-10-CM | POA: Diagnosis not present

## 2021-11-21 DIAGNOSIS — K2950 Unspecified chronic gastritis without bleeding: Secondary | ICD-10-CM | POA: Diagnosis present

## 2021-11-21 DIAGNOSIS — Z96611 Presence of right artificial shoulder joint: Secondary | ICD-10-CM | POA: Diagnosis present

## 2021-11-21 DIAGNOSIS — H409 Unspecified glaucoma: Secondary | ICD-10-CM | POA: Diagnosis present

## 2021-11-21 DIAGNOSIS — Z20822 Contact with and (suspected) exposure to covid-19: Secondary | ICD-10-CM | POA: Diagnosis present

## 2021-11-21 DIAGNOSIS — I083 Combined rheumatic disorders of mitral, aortic and tricuspid valves: Secondary | ICD-10-CM | POA: Diagnosis present

## 2021-11-21 DIAGNOSIS — I10 Essential (primary) hypertension: Secondary | ICD-10-CM | POA: Diagnosis not present

## 2021-11-21 DIAGNOSIS — S42352A Displaced comminuted fracture of shaft of humerus, left arm, initial encounter for closed fracture: Secondary | ICD-10-CM | POA: Diagnosis not present

## 2021-11-21 DIAGNOSIS — E1051 Type 1 diabetes mellitus with diabetic peripheral angiopathy without gangrene: Secondary | ICD-10-CM | POA: Diagnosis present

## 2021-11-21 DIAGNOSIS — N181 Chronic kidney disease, stage 1: Secondary | ICD-10-CM | POA: Diagnosis not present

## 2021-11-21 DIAGNOSIS — R748 Abnormal levels of other serum enzymes: Secondary | ICD-10-CM | POA: Diagnosis not present

## 2021-11-21 DIAGNOSIS — M810 Age-related osteoporosis without current pathological fracture: Secondary | ICD-10-CM | POA: Diagnosis present

## 2021-11-21 DIAGNOSIS — N182 Chronic kidney disease, stage 2 (mild): Secondary | ICD-10-CM | POA: Diagnosis not present

## 2021-11-21 DIAGNOSIS — E785 Hyperlipidemia, unspecified: Secondary | ICD-10-CM | POA: Diagnosis present

## 2021-11-21 DIAGNOSIS — M1990 Unspecified osteoarthritis, unspecified site: Secondary | ICD-10-CM | POA: Diagnosis present

## 2021-11-21 LAB — CBC
HCT: 27.9 % — ABNORMAL LOW (ref 36.0–46.0)
Hemoglobin: 9.3 g/dL — ABNORMAL LOW (ref 12.0–15.0)
MCH: 29.6 pg (ref 26.0–34.0)
MCHC: 33.3 g/dL (ref 30.0–36.0)
MCV: 88.9 fL (ref 80.0–100.0)
Platelets: 145 10*3/uL — ABNORMAL LOW (ref 150–400)
RBC: 3.14 MIL/uL — ABNORMAL LOW (ref 3.87–5.11)
RDW: 13.8 % (ref 11.5–15.5)
WBC: 7.9 10*3/uL (ref 4.0–10.5)
nRBC: 0 % (ref 0.0–0.2)

## 2021-11-21 LAB — BASIC METABOLIC PANEL
Anion gap: 7 (ref 5–15)
BUN: 18 mg/dL (ref 8–23)
CO2: 22 mmol/L (ref 22–32)
Calcium: 7.8 mg/dL — ABNORMAL LOW (ref 8.9–10.3)
Chloride: 105 mmol/L (ref 98–111)
Creatinine, Ser: 0.92 mg/dL (ref 0.44–1.00)
GFR, Estimated: >60 mL/min (ref >60)
Glucose, Bld: 204 mg/dL — ABNORMAL HIGH (ref 70–99)
Potassium: 4.2 mmol/L (ref 3.5–5.1)
Sodium: 134 mmol/L — ABNORMAL LOW (ref 135–145)

## 2021-11-21 LAB — CK: Total CK: 867 U/L — ABNORMAL HIGH (ref 38–234)

## 2021-11-21 LAB — GLUCOSE, CAPILLARY
Glucose-Capillary: 222 mg/dL — ABNORMAL HIGH (ref 70–99)
Glucose-Capillary: 325 mg/dL — ABNORMAL HIGH (ref 70–99)
Glucose-Capillary: 293 mg/dL — ABNORMAL HIGH (ref 70–99)
Glucose-Capillary: 131 mg/dL — ABNORMAL HIGH (ref 70–99)

## 2021-11-21 LAB — MAGNESIUM: Magnesium: 1.8 mg/dL (ref 1.7–2.4)

## 2021-11-21 MED ORDER — MAGNESIUM SULFATE IN D5W 1-5 GM/100ML-% IV SOLN
1.0000 g | Freq: Once | INTRAVENOUS | Status: AC
  Administered 2021-11-21: 16:00:00 1 g via INTRAVENOUS
  Filled 2021-11-21: qty 100

## 2021-11-21 MED ORDER — INSULIN ASPART 100 UNIT/ML IJ SOLN
2.0000 [IU] | Freq: Three times a day (TID) | INTRAMUSCULAR | Status: DC
  Administered 2021-11-21 – 2021-11-23 (×5): 2 [IU] via SUBCUTANEOUS

## 2021-11-21 MED ORDER — SODIUM CHLORIDE 0.9 % IV SOLN: INTRAVENOUS | Status: DC

## 2021-11-21 MED ORDER — INSULIN ASPART 100 UNIT/ML IJ SOLN: 0.0000 [IU] | Freq: Every day | INTRAMUSCULAR | Status: DC

## 2021-11-21 MED ORDER — INSULIN ASPART 100 UNIT/ML IJ SOLN
0.0000 [IU] | Freq: Three times a day (TID) | INTRAMUSCULAR | Status: DC
  Administered 2021-11-21: 17:00:00 3 [IU] via SUBCUTANEOUS
  Administered 2021-11-22: 12:00:00 2 [IU] via SUBCUTANEOUS
  Administered 2021-11-22: 09:00:00 1 [IU] via SUBCUTANEOUS
  Administered 2021-11-23: 17:00:00 2 [IU] via SUBCUTANEOUS
  Administered 2021-11-23: 08:00:00 1 [IU] via SUBCUTANEOUS
  Administered 2021-11-24 (×2): 2 [IU] via SUBCUTANEOUS
  Administered 2021-11-24: 13:00:00 1 [IU] via SUBCUTANEOUS
  Administered 2021-11-25: 09:00:00 2 [IU] via SUBCUTANEOUS

## 2021-11-21 NOTE — Progress Notes (Signed)
PROGRESS NOTE  Danielle Clarke  ZOX:[id number] DOB: 01/12/1941 DOA: 11/19/2021 PCP: Danielle Gosselin, MD   Brief Narrative: Danielle Clarke is a 80 y.o. female with a history of sinus arrest, heart block s/p PPM, moderate AS, HTN, T1DM, PVD, HLD, stage IIIb CKD who presented to the ED 12/24 after falling at home. She tripped on the carpet around 12:30am, striking her left shoulder and had immediate severe pain there. As she tried to get up, she was too weak to do so without being able to use the left arm and the pain caused her to pass out. She ended up remaining on the ground unable to get to her phone until her son discovered her about 6.5 hours later. In the ED she was afebrile, hypertensive with CK elevated to 949, SCr 1.11, glucose 375. WBC 12.4k. Pacer interrogation yielded no events. 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 XR confirmed mildly comminuted, displaced proximal humerus fracture for which orthopedics was consulted. The arm was placed in a sling, fentanyl given for pain which caused vomiting, and insulin was administered on admission. Despite IVF overnight, CK has risen to 1880. Echocardiogram revealed vigorous LV systolic function, worsening, now severe, aortic stenosis, mild-moderate mitral regurgitation and stenosis. Physical therapy has recommended SNF placement for rehabilitation due to impairments.   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  - PT/OT consulted, SNF currently recommended  Closed displaced, comminuted left humerus fracture:  - Orthopedics recommending sling, formal consult pending. Continue pain control, tolerating oral hydrocodone  Syncope: Syncope does not appear cardiogenic at this time with normal interrogation of pacemaker and alternative explanation (severe acute pain). Though valvular heart disease appears to have worsened, would not attribute this event to that.  - Continue telemetry - Note 1-39% carotid stenosis on U/S Sept 2021.   Prolonged QT interval:  - Optimize K, Mg.   - Avoid/minimize provocative medications - Continue telemetry, improved.  Severe aortic stenosis:  Note wide pulse pressure and loud murmur. Has no symptoms of angina or CHF, and doubt this presentation is related to AS. - Needs cardiology follow up.   Mild rhabdomyolysis: Due to prolonged immobility and fracture. Initial worsening, has improved.  - Will continue IVF  T1DM: Brittle with patient-endorsed plan for permissive hyperglycemia due to severe symptoms with hypoglycemia. HbA1c 9.4%. - Continue basal-bolus insulin. Pt is type 1 and very sensitive. Add 2u mealtime and deescalate to very sensitive SSI, continue glargine 12u.  - Follow up with Dr. Talmage Clarke after discharge.  Demand myocardial ischemia: Very mild troponin elevated (hs-Tn 18 > 19) without ischemic ST segment changes on ECG nor chest pain. No inpatient work up currently planned.   CHB s/p PPM 2020  Stage IIIb CKD:  - Improving with continued IVF. Baseline appears ~1.   Leukocytosis: Without fever or nidus of infection. Likely reactive to trauma, resolved. - Monitor off abx at this time.   HLD:  - Continue  statin at discharge  HTN:  - Continue prn medication  Anemia of chronic disease: No bleeding noted at this time. Stable.  DVT prophylaxis: Lovenox, decreased to 30mg  q24h Code Status: Full Family Communication: None at  bedside Disposition Plan:  Status is: Inpatient  The patient remains Giving IVF for rhabdomyolysis. Unsafe DC: requires 24/7 assistance not available at home. Rehab stay is recommended to return to PLOF.  Consultants:  Orthopedics  Procedures:  None  Antimicrobials: None   Subjective: Pain in the left shoulder is severe with any movement at all. She is unable to rise to seated position even when coughing on water/pills this morning. No chest pain, palpitations, or leg swelling.   Objective: Vitals:   11/20/21 1614 11/20/21 1959 11/21/21 0508 11/21/21 0932  BP: 117/80 (!) 121/43 (!) 149/48   Pulse: 85 80  83  Resp: 16 (!) 21  17  Temp: 98 F (36.7 C) 98.3 F (36.8 C) 98.2 F (36.8 C)   TempSrc: Oral Oral Oral   SpO2: 98% 100%  96%  Weight:   57.5 kg   Height:        Intake/Output Summary (Last 24 hours) at 11/21/2021 1428 Last data filed at 11/21/2021 0514 Gross per 24 hour  Intake 1420.3 ml  Output 900 ml  Net 520.3 ml   Filed Weights   11/19/21 1741 11/20/21 0507 11/21/21 0508  Weight: 52 kg 54 kg 57.5 kg   Gen: Small elderly, pleasant female in no distress Pulm: Nonlabored breathing room air. Clear. CV: Regular rate and rhythm. III/VI harsh holosystolic murmur is stable, no rub, or gallop. No JVD, no dependent edema. GI: Abdomen soft, non-tender, non-distended, with normoactive bowel sounds.  Ext: Warm, no deformities. LUE in sling is neurovascularly intact, shoulder TTP Skin: No new rashes, lesions or ulcers on visualized skin. Neuro: Alert and oriented. No focal neurological deficits. Psych: Judgement and insight appear fair. Mood euthymic & affect congruent. Behavior is appropriate.    Data Reviewed: I have personally reviewed following labs and imaging studies  CBC: Recent Labs  Lab 11/19/21 1115 11/20/21 0314 11/21/21 0504  WBC 12.4* 10.1 7.9  NEUTROABS 11.1*  --   --   HGB 11.6* 9.2* 9.3*  HCT 36.6 28.4* 27.9*  MCV 89.7 88.8 88.9  PLT 202 157  [id number]*   Basic Metabolic Panel: Recent Labs  Lab 11/19/21 1115 11/19/21 1500 11/20/21 0314 11/21/21 0504  NA 137  --  138 134*  K 5.0  --  4.4 4.2  CL 100  --  104 105  CO2 23  --  26 22  GLUCOSE 375*  --  216* 204*  BUN 19  --  22 18  CREATININE 1.11*  --  1.14* 0.92  CALCIUM 9.2  --  8.4* 7.8*  MG  --  1.9  --  1.8   GFR: Estimated Creatinine Clearance: 39.4 mL/min (by C-G formula based on SCr of 0.92 mg/dL). Liver Function Tests: No results for input(s): AST, ALT, ALKPHOS, BILITOT, PROT, ALBUMIN in the last 168 hours. No results for input(s): LIPASE, AMYLASE in the last 168 hours. No results for input(s): AMMONIA in the last 168 hours. Coagulation Profile: No results for input(s): INR, PROTIME in the last 168 hours. Cardiac Enzymes: Recent Labs  Lab 11/19/21 1115 11/20/21 0314 11/21/21 0504  CKTOTAL 949* 1,880* 867*   BNP (last 3 results) No results for input(s): PROBNP in the last 8760 hours. HbA1C: No results for input(s): HGBA1C in the last 72  hours. CBG: Recent Labs  Lab 11/20/21 1123 11/20/21 1612 11/20/21 2121 11/21/21 0736 11/21/21 1113  GLUCAP 248* 215* 134* 222* 325*   Lipid Profile: No results for input(s): CHOL, HDL, LDLCALC, TRIG, CHOLHDL, LDLDIRECT in the last 72 hours. Thyroid Function Tests: Recent Labs    11/19/21 1519  TSH 0.910   Anemia Panel: No results for input(s): VITAMINB12, FOLATE, FERRITIN, TIBC, IRON, RETICCTPCT in the last 72 hours. Urine analysis:    Component Value Date/Time   COLORURINE YELLOW 10/14/2020 [place] 10/14/2020 1216   LABSPEC 1.009 10/14/2020 1216   PHURINE 6.0 10/14/2020 1216   GLUCOSEU >=500 (A) 10/14/2020 1216   HGBUR NEGATIVE 10/14/2020 1216   BILIRUBINUR NEGATIVE 10/14/2020 1216   KETONESUR 5 (A) 10/14/2020 1216   PROTEINUR NEGATIVE 10/14/2020 1216   UROBILINOGEN 0.2 10/07/2014 1808   NITRITE NEGATIVE 10/14/2020 1216   LEUKOCYTESUR NEGATIVE 10/14/2020 1216   Recent Results  (from the past 240 hour(s))  Resp Panel by RT-PCR (Flu A&B, Covid) Nasopharyngeal Swab     Status: None   Collection Time: 11/19/21 11:15 AM   Specimen: Nasopharyngeal Swab; Nasopharyngeal(NP) swabs in vial transport medium  Result Value Ref Range Status   SARS Coronavirus 2 by RT PCR NEGATIVE NEGATIVE Final    Comment: (NOTE) SARS-CoV-2 target nucleic acids are NOT DETECTED.  The SARS-CoV-2 RNA is generally detectable in upper respiratory specimens during the acute phase of infection. The lowest concentration of SARS-CoV-2 viral copies this assay can detect is 138 copies/mL. A negative result does not preclude SARS-Cov-2 infection and should not be used as the sole basis for treatment or other patient management decisions. A negative result may occur with  improper specimen collection/handling, submission of specimen other than nasopharyngeal swab, presence of viral mutation(s) within the areas targeted by this assay, and inadequate number of viral copies(<138 copies/mL). A negative result must be combined with clinical observations, patient history, and epidemiological information. The expected result is Negative.  Fact Sheet for Patients:  EntrepreneurPulse.com.au  Fact Sheet for Healthcare Providers:  IncredibleEmployment.be  This test is no t yet approved or cleared by the [place] FDA and  has been authorized for detection and/or diagnosis of SARS-CoV-2 by FDA under an Emergency Use Authorization (EUA). This EUA will remain  in effect (meaning this test can be used) for the duration of the COVID-19 declaration under Section 564(b)(1) of the Act, 21 U.S.C.section 360bbb-3(b)(1), unless the authorization is terminated  or revoked sooner.       Influenza A by PCR NEGATIVE NEGATIVE Final   Influenza B by PCR NEGATIVE NEGATIVE Final    Comment: (NOTE) The Xpert Xpress SARS-CoV-2/FLU/RSV plus assay is intended as an aid in the  diagnosis of influenza from Nasopharyngeal swab specimens and should not be used as a sole basis for treatment. Nasal washings and aspirates are unacceptable for Xpert Xpress SARS-CoV-2/FLU/RSV testing.  Fact Sheet for Patients: EntrepreneurPulse.com.au  Fact Sheet for Healthcare Providers: IncredibleEmployment.be  This test is not yet approved or cleared by the [place] FDA and has been authorized for detection and/or diagnosis of SARS-CoV-2 by FDA under an Emergency Use Authorization (EUA). This EUA will remain in effect (meaning this test can be used) for the duration of the COVID-19 declaration under Section 564(b)(1) of the Act, 21 U.S.C. section 360bbb-3(b)(1), unless the authorization is terminated or revoked.  Performed at [facility], [place] [address] [place], [place] [address]       Radiology Studies: ECHOCARDIOGRAM COMPLETE  Result Date: 11/19/2021    ECHOCARDIOGRAM REPORT   Patient Name:   Danielle Clarke Date of Exam: 11/19/2021 Medical Rec #:  [id number]          Height:       60.0 in Accession #:    [id number]         Weight:       118.6 lb Date of Birth:  [birth year]          BSA:          1.495 m Patient Age:    64 years           BP:           160/55 mmHg Patient Gender: F                  HR:           94 bpm. Exam Location:  Inpatient Procedure: 2D Echo, Cardiac Doppler and Color Doppler Indications:    Syncope  History:        Patient has prior history of Echocardiogram examinations, most                 recent 04/27/2021. Defibrillator, Aortic Valve Disease and Mitral                 Valve Disease; Risk Factors:Hypertension and Diabetes. PVD. CKD.  Sonographer:    Clayton Lefort RDCS (AE) Referring Phys: [id number] [facility]  Sonographer Comments: Suboptimal subcostal window. Patients left arm immobilized in sling. IMPRESSIONS  1. Left ventricular ejection fraction, by estimation, is 65 to 70%. The left ventricle has normal  function. Left ventricular endocardial border not optimally defined to evaluate regional wall motion. There is mild concentric left ventricular hypertrophy. Left ventricular diastolic parameters are consistent with Grade I diastolic dysfunction (impaired relaxation). Elevated left ventricular end-diastolic pressure.  2. RV-RA gradient 35 mmHg suggests at least mildly increased PASP. Right ventricular systolic function is normal. The right ventricular size is normal.  3. The mitral valve is abnormal. Mild to moderate mitral valve regurgitation. Mild to moderate mitral stenosis. The mean mitral valve gradient is 7.0 mmHg. Moderate mitral annular calcification.  4. The aortic valve has an indeterminant number of cusps. There is moderate calcification of the aortic valve. Aortic valve regurgitation is not visualized. Severe aortic valve stenosis. Aortic valve mean gradient measures 38.8 mmHg.  5. Unable to estimate CVP. Comparison(s): Prior images reviewed side by side. LVEF vigorous. Aortic stenosis more clearly in severe range, although valve not well visualized. Mild to moderate mitral stenosis and regurgitation as well. FINDINGS  Left Ventricle: Left ventricular ejection fraction, by estimation, is 65 to 70%. The left ventricle has normal function. Left ventricular endocardial border not optimally defined to evaluate regional wall motion. The left ventricular internal cavity size was normal in size. There is mild concentric left ventricular hypertrophy. Left ventricular diastolic parameters are consistent with Grade I diastolic dysfunction (impaired relaxation). Elevated left ventricular end-diastolic pressure. Right Ventricle: RV-RA gradient 35 mmHg suggests at least mildly increased PASP. The right ventricular size is normal. No increase in right ventricular wall thickness. Right ventricular systolic function is normal. Left Atrium: Left atrial size was normal in size. Right Atrium: Right atrial size was normal in  size. Pericardium: There is no evidence of pericardial effusion. Mitral Valve: The mitral valve is abnormal. There is mild thickening of the mitral valve leaflet(s). There is moderate calcification of the mitral valve leaflet(s). Moderate mitral annular  calcification. Mild to moderate mitral valve regurgitation. Mild to moderate mitral valve stenosis. MV peak gradient, 16.8 mmHg. The mean mitral valve gradient is 7.0 mmHg. Tricuspid Valve: The tricuspid valve is grossly normal. Tricuspid valve regurgitation is mild. Aortic Valve: The aortic valve has an indeterminant number of cusps. There is moderate calcification of the aortic valve. Aortic valve regurgitation is not visualized. Severe aortic stenosis is present. Aortic valve mean gradient measures 38.8 mmHg. Aortic valve peak gradient measures 62.4 mmHg. Aortic valve area, by VTI measures 0.71 cm. Pulmonic Valve: The pulmonic valve was not well visualized. Pulmonic valve regurgitation is not visualized. Aorta: The aortic root is normal in size and structure. Venous: Unable to estimate CVP. The inferior vena cava was not well visualized. IAS/Shunts: No atrial level shunt detected by color flow Doppler. Additional Comments: A device lead is visualized.  LEFT VENTRICLE PLAX 2D LVIDd:         2.90 cm   Diastology LVIDs:         2.20 cm   LV e' medial:    5.22 cm/s LV PW:         1.60 cm   LV E/e' medial:  19.3 LV IVS:        1.30 cm   LV e' lateral:   6.96 cm/s LVOT diam:     1.90 cm   LV E/e' lateral: 14.5 LV SV:         59 LV SV Index:   39 LVOT Area:     2.84 cm  RIGHT VENTRICLE RV Basal diam:  3.00 cm RV S prime:     14.60 cm/s TAPSE (M-mode): 1.7 cm LEFT ATRIUM             Index        RIGHT ATRIUM           Index LA diam:        3.10 cm 2.07 cm/m   RA Area:     10.20 cm LA Vol (A2C):   28.2 ml 18.86 ml/m  RA Volume:   20.30 ml  13.58 ml/m LA Vol (A4C):   33.3 ml 22.28 ml/m LA Biplane Vol: 31.8 ml 21.27 ml/m  AORTIC VALVE AV Area (Vmax):    0.83 cm AV  Area (Vmean):   0.76 cm AV Area (VTI):     0.71 cm AV Vmax:           395.00 cm/s AV Vmean:          294.400 cm/s AV VTI:            0.828 m AV Peak Grad:      62.4 mmHg AV Mean Grad:      38.8 mmHg LVOT Vmax:         115.00 cm/s LVOT Vmean:        78.700 cm/s LVOT VTI:          0.208 m LVOT/AV VTI ratio: 0.25  AORTA Ao Root diam: 3.10 cm MITRAL VALVE                TRICUSPID VALVE MV Area (PHT): 2.22 cm     TR Peak grad:   35.0 mmHg MV Area VTI:   1.58 cm     TR Vmax:        296.00 cm/s MV Peak grad:  16.8 mmHg MV Mean grad:  7.0 mmHg     SHUNTS MV Vmax:       2.05 m/s  Systemic VTI:  0.21 m MV Vmean:      120.0 cm/s   Systemic Diam: 1.90 cm MV Decel Time: 342 msec MV E velocity: 101.00 cm/s MV A velocity: 183.00 cm/s MV E/A ratio:  0.55 Rozann Lesches MD Electronically signed by Rozann Lesches MD Signature Date/Time: 11/19/2021/4:39:51 PM    Final     Scheduled Meds:  enoxaparin (LOVENOX) injection  30 mg Subcutaneous Q24H   insulin aspart  0-9 Units Subcutaneous TID WC   insulin glargine-yfgn  12 Units Subcutaneous q morning   loratadine  10 mg Oral Daily   sodium chloride flush  3 mL Intravenous Q12H   Continuous Infusions:   LOS: 0 days   Time spent: 25 minutes.  Patrecia Pour, MD [facility] [email or web address] 11/21/2021, 2:28 PM

## 2021-11-21 NOTE — TOC Initial Note (Signed)
Transition of Care [facility]) - Initial/Assessment Note    Patient Details  Name: Danielle Clarke MRN: [id number] Date of Birth: 1941-02-19  [facility] CM/SW Contact:    Marilu Favre, RN Phone Number: 11/21/2021, 4:01 PM  Clinical Narrative:                  Spoke to patient at bedside. Discussed PT recommendation for SNF. Patient prefers to go home at discharge with home health.   Patient's daughter lives next door, however daughter is a Catering manager and out of town at times. Patient's son lives four miles away from her.   Discussed home health PT would only come 3 times a week for hour at a time. Patient voiced understanding. From admission in 2021 looks like daughter may have hired Retail banker. Patient stated daughter is getting home this evening at 10 pm and she would would like to discuss discharge plan with her daughter face to face. Patient would like a TOC team to follow up with her tomorrow after she and her daughter have talked. NCM will enter a TOC hand off for TOC to follow up 12/27.   Patient already has cane, walker , shower chair and wheel chair at home.   NCM left a medicare.gov list of home health agencies with patient.    Expected Discharge Plan: [place] Barriers to Discharge: Continued Medical Work up   Patient Goals and CMS Choice Patient states their goals for this hospitalization and ongoing recovery are:: to go home CMS Medicare.gov Compare Post Acute Care list provided to:: Patient Choice offered to / list presented to : Patient  Expected Discharge Plan and Services Expected Discharge Plan: [place] Choice: [place] arrangements for the past 2 months: Single Family Home                                      Prior Living Arrangements/Services Living arrangements for the past 2 months: Single Family Home Lives with:: Self Patient language and need for  interpreter reviewed:: Yes Do you feel safe going back to the place where you live?: Yes      Need for Family Participation in Patient Care: Yes (Comment) Care giver support system in place?: Yes (comment) Current home services: DME Criminal Activity/Legal Involvement Pertinent to Current Situation/Hospitalization: No - Comment as needed  Activities of Daily Living Home Assistive Devices/Equipment: Cane (specify quad or straight), Eyeglasses, Grab bars around toilet, Grab bars in shower, Hand-held shower hose, Raised toilet seat with rails, Shower chair with back, Walker (specify type) ADL Screening (condition at time of admission) Patient's cognitive ability adequate to safely complete daily activities?: Yes Is the patient deaf or have difficulty hearing?: No Does the patient have difficulty seeing, even when wearing glasses/contacts?: No Does the patient have difficulty concentrating, remembering, or making decisions?: No Patient able to express need for assistance with ADLs?: Yes Does the patient have difficulty dressing or bathing?: No Independently performs ADLs?: Yes (appropriate for developmental age) Does the patient have difficulty walking or climbing stairs?: Yes Weakness of Legs: Both Weakness of Arms/Hands: Both  Permission Sought/Granted Permission sought to share information with : Family Supports Permission granted to share information with : No              Emotional Assessment Appearance:: Appears  stated age Attitude/Demeanor/Rapport: Engaged Affect (typically observed): Accepting Orientation: : Oriented to Self, Oriented to Place, Oriented to  Time, Oriented to Situation Alcohol / Substance Use: Not Applicable Psych Involvement: No (comment)  Admission diagnosis:  Syncope [R55] Fall [W19.XXXA] Near syncope [R55] Closed fracture of proximal end of left humerus, unspecified fracture morphology, initial encounter [S42.202A] Patient Active Problem List    Diagnosis Date Noted   Closed comminuted left humeral fracture 11/19/2021   Prolonged QT interval 11/19/2021   Elevated CK 11/19/2021   CKD (chronic kidney disease), stage III (HCC) 10/14/2020   Abnormality of gait 12/01/2019   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) 12/01/2019   Traumatic brain injury with loss of consciousness (HCC) 05/28/2019   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 05/20/2019   Hypertensive crisis    Hypokalemia    Leukocytosis    Lethargy    Labile blood pressure    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    Acute blood loss anemia    Low grade fever    Hypoglycemia    Multiple closed fractures of ribs of right side    Pleural effusion on right    Hypoalbuminemia due to protein-calorie malnutrition (HCC)    Dysphagia    Traumatic cerebral intraparenchymal hematoma 02/14/2019   Pacemaker    Labile blood glucose    Diabetes mellitus type 2 in nonobese (HCC)    Dyslipidemia    Essential hypertension    Hypernatremia    Anemia of chronic disease    Hypertensive urgency 02/07/2019   Intracerebral hemorrhage (HCC) 02/07/2019   Intracranial bleeding (HCC) 02/06/2019   Elevated troponin    Abnormal EKG    Bronchitis 08/05/2017   Bilateral carotid artery stenosis    Aortic stenosis 08/25/2015   Cervical stenosis of spinal canal 07/31/2014   Essential hypertension, benign 12/30/2013   Hyperlipidemia 12/30/2013   Toe infection 12/30/2013   Acute renal failure (HCC) 12/29/2013   Syncope 01/02/2013   Diabetes mellitus type 1 (HCC) 01/02/2013   Bilateral carotid artery disease (HCC) 01/02/2013   GOITER, MULTINODULAR 02/11/2008   PCP:  Catha Gosselin, MD Pharmacy:   [facility] [address] [place] - [address] [place] [address] Phone: [phone] Fax: [phone]     Social Determinants of Health (SDOH) Interventions    Readmission Risk  Interventions Readmission Risk Prevention Plan 10/15/2020  Post Dischage Appt Complete  Medication Screening Complete  Transportation Screening Complete  Some recent data might be hidden

## 2021-11-21 NOTE — Evaluation (Signed)
POccupational Therapy Evaluation Patient Details Name: Danielle Clarke MRN: [id number] DOB: 09-01-1941 Today's Date: 11/21/2021   History of Present Illness Pt is a 80 y.o. female admitted 11/19/21 after syncope and fall resulting in a displaced proximal L humeral neck fx. Head CT negative for acute intracranial changes. Awaiting ortho consult 12/25. PMH includes symptomatic bradycardia/sinus arrest s/p PPM, syncope, VHD with moderate AS, HTN, DM, PVD, HLD, CKD III, ACD.   Clinical Impression   PTA patient independent using cane for mobility and ADLs. Admitted for above and limited by problem list below, including generalized weakness, decreased activity tolerance, impaired balance and pain to L UE.  Educated on sling mgmt, positioning for comfort and pain reduction;  LUE exercises to hand/wrist.  Patient requires up to max assist for ADLs and min assist for transfers.  She will benefit from further OT services while admitted and after dc at SNF level to optimize independence with ADLs and mobility.    To dc home, pt would need 24/7 supervision and assist with ADLs, IADLs and mobility due to pain, decreased functional use of L UE and impaired balance.  Will follow acutely.    Recommendations for follow up therapy are one component of a multi-disciplinary discharge planning process, led by the attending physician.  Recommendations may be updated based on patient status, additional functional criteria and insurance authorization.   Follow Up Recommendations  Skilled nursing-short term rehab (<3 hours/day)    Assistance Recommended at Discharge Frequent or constant Supervision/Assistance  Functional Status Assessment  Patient has had a recent decline in their functional status and demonstrates the ability to make significant improvements in function in a reasonable and predictable amount of time.  Equipment Recommendations  BSC/3in1    Recommendations for Other Services       Precautions  / Restrictions Precautions Precautions: Fall Required Braces or Orthoses: Sling Restrictions Weight Bearing Restrictions: Yes LUE Weight Bearing: Non weight bearing Other Position/Activity Restrictions: No ortho consult yet - assuming NWB, limiting motion in sling      Mobility Bed Mobility Overal bed mobility: Needs Assistance Bed Mobility: Supine to Sit;Sit to Supine     Supine to sit: Mod assist Sit to supine: Mod assist   General bed mobility comments: for trunk support and scooting    Transfers                          Balance Overall balance assessment: Needs assistance Sitting-balance support: No upper extremity supported;Feet supported Sitting balance-Leahy Scale: Fair     Standing balance support: Single extremity supported;During functional activity Standing balance-Leahy Scale: Poor Standing balance comment: Reliant on RUE support for balance                           ADL either performed or assessed with clinical judgement   ADL Overall ADL's : Needs assistance/impaired Eating/Feeding: Set up;Sitting   Grooming: Minimal assistance;Sitting           Upper Body Dressing : Maximal assistance;Sitting   Lower Body Dressing: Maximal assistance;Sit to/from stand   Toilet Transfer: Minimal assistance Statistician Details (indicate cue type and reason): simulated side stepping to [facility]           General ADL Comments: pt limited by pain, balance, weakness     Vision   Vision Assessment?: No apparent visual deficits     Perception     Praxis  Pertinent Vitals/Pain Pain Assessment: Faces Faces Pain Scale: Hurts whole lot Pain Location: LUE Pain Descriptors / Indicators: Grimacing;Guarding Pain Intervention(s): Limited activity within patient's tolerance;Repositioned;Patient requesting pain meds-RN notified;Monitored during session     Hand Dominance Right   Extremity/Trunk Assessment Upper Extremity  Assessment Upper Extremity Assessment: LUE deficits/detail;Generalized weakness LUE Deficits / Details: immobilized in sling, noted bruising on anterior shoulder; wrist/finger motion WFL although sore; mild edema in hand/fingers LUE: Unable to fully assess due to pain;Unable to fully assess due to immobilization LUE Sensation: WNL LUE Coordination: decreased fine motor;decreased gross motor   Lower Extremity Assessment Lower Extremity Assessment: Defer to PT evaluation   Cervical / Trunk Assessment Cervical / Trunk Assessment: Kyphotic   Communication Communication Communication: No difficulties   Cognition Arousal/Alertness: Awake/alert Behavior During Therapy: Anxious Overall Cognitive Status: Impaired/Different from baseline Area of Impairment: Awareness;Problem solving;Memory                     Memory: Decreased short-term memory     Awareness: Emergent Problem Solving: Slow processing;Requires verbal cues;Difficulty sequencing General Comments: WFL for simple tasks; question some decreased awareness into deficits and extent of need for assist as pt wanting to return home though no support available; tangential and decreased recall of sling mgmt     General Comments  repositioning L UE into sling, educated on positoining and use of pillow to support.  Encouraged hand and finger ROM to reduce edema.    Exercises     Shoulder Instructions      Home Living Family/patient expects to be discharged to:: Private residence Living Arrangements: Alone Available Help at Discharge: Family;Neighbor;Available PRN/intermittently Type of Home: House Home Access: Stairs to enter [employer] of Steps: 2-3 Entrance Stairs-Rails: Left Home Layout: One level     Bathroom Shower/Tub: Chief Strategy Officer: Handicapped height     Home Equipment: Agricultural consultant (2 wheels);Cane - single point;Shower seat;Grab bars - tub/shower   Additional Comments:  Daughter lives next door but works as Financial controller and is currently gone working. Son's family lives 4 miles away, but does not seem like consistent option for support      Prior Functioning/Environment Prior Level of Function : Independent/Modified Independent             Mobility Comments: Typicallly mod indep with SPC, lives alone. Enjoys caring for cat ADLs Comments: drives, independent IADLs        OT Problem List: Decreased strength;Decreased range of motion;Decreased activity tolerance;Impaired balance (sitting and/or standing);Decreased coordination;Decreased safety awareness;Decreased knowledge of use of DME or AE;Decreased knowledge of precautions;Pain;Impaired UE functional use;Increased edema      OT Treatment/Interventions: Self-care/ADL training;Therapeutic exercise;DME and/or AE instruction;Therapeutic activities;Patient/family education;Balance training    OT Goals(Current goals can be found in the care plan section) Acute Rehab OT Goals Patient Stated Goal: less pain OT Goal Formulation: With patient Time For Goal Achievement: 12/05/21 Potential to Achieve Goals: Good  OT Frequency: Min 2X/week   Barriers to D/C:            Co-evaluation              AM-PAC OT "6 Clicks" Daily Activity     Outcome Measure Help from another person eating meals?: A Little Help from another person taking care of personal grooming?: A Little Help from another person toileting, which includes using toliet, bedpan, or urinal?: A Lot Help from another person bathing (including washing, rinsing, drying)?: A Lot Help from another  person to put on and taking off regular upper body clothing?: A Lot Help from another person to put on and taking off regular lower body clothing?: A Lot 6 Click Score: 14   End of Session Equipment Utilized During Treatment: Other (comment) (sling) Nurse Communication: Mobility status;Patient requests pain meds  Activity Tolerance: Patient  tolerated treatment well;Patient limited by pain Patient left: in bed;with call bell/phone within reach;with bed alarm set  OT Visit Diagnosis: Other abnormalities of gait and mobility (R26.89);History of falling (Z91.81);Muscle weakness (generalized) (M62.81)                Time: 9628-3662 OT Time Calculation (min): 24 min Charges:  OT General Charges $OT Visit: 1 Visit OT Evaluation $OT Eval Moderate Complexity: 1 Mod OT Treatments $Self Care/Home Management : 8-22 mins  Barry Brunner, OT Acute Rehabilitation Services Pager [phone] Office [phone]   Chancy Milroy 11/21/2021, 10:02 AM

## 2021-11-21 NOTE — Progress Notes (Signed)
Inpatient Diabetes Program Recommendations  AACE/ADA: New Consensus Statement on Inpatient Glycemic Control (2015)  Target Ranges:  Prepandial:   less than 140 mg/dL      Peak postprandial:   less than 180 mg/dL (1-2 hours)      Critically ill patients:  140 - 180 mg/dL   Lab Results  Component Value Date   GLUCAP 325 (H) 11/21/2021   HGBA1C 9.4 (H) 10/14/2020    Review of Glycemic Control  Latest Reference Range & Units 11/20/21 07:23 11/20/21 11:23 11/20/21 16:12 11/20/21 21:21 11/21/21 07:36 11/21/21 11:13  Glucose-Capillary 70 - 99 mg/dL 003 (H) 704 (H) 888 (H) 134 (H) 222 (H) 325 (H)  (H): Data is abnormally high  Diabetes history: DM1(does not make insulin.  Needs correction, basal and meal coverage) Outpatient Diabetes medications:  Lantus 12 units QD, Humalog 0-6 units TID Current orders for Inpatient glycemic control:  Semglee 12 units QD, Novolog 0-9 units TID  Inpatient Diabetes Program Recommendations:    Novolog 3 units TID with meals if eats at least 50%  Will continue to follow while inpatient.  Thank you, Dulce Sellar, RN, BSN Diabetes Coordinator Inpatient Diabetes Program [phone] (team pager from 8a-5p)

## 2021-11-21 NOTE — Progress Notes (Signed)
Physical Therapy Treatment Patient Details Name: Danielle Clarke MRN: [id number] DOB: 05-11-1941 Today's Date: 11/21/2021   History of Present Illness Pt is a 80 y.o. female admitted 11/19/21 after syncope and fall resulting in a displaced proximal L humeral neck fx. Head CT negative for acute intracranial changes. Awaiting ortho consult 12/25. PMH includes symptomatic bradycardia/sinus arrest s/p PPM, syncope, VHD with moderate AS, HTN, DM, PVD, HLD, CKD III, ACD.    PT Comments    Pt was seen for progressing mobility, and was hindered by pain on L shoulder.  Contacted nursing to supplement meds, but had not yet received them by end of session.  Pt is expressing concern that her dose is not yet due but has had excessive L shoulder pain since this AM.  Follow along with her to encourage mobility as tolerated, and continue to recommend SNF since pt does not have ability to move yet and without family support to go home safely.  Focus on standing balance skills, support of LUE with positioning to create comfort and work toward a more controlled gait situation as pt is able.  Not symptomatic of light headed feelings but was in such pain that she is limited.    Recommendations for follow up therapy are one component of a multi-disciplinary discharge planning process, led by the attending physician.  Recommendations may be updated based on patient status, additional functional criteria and insurance authorization.  Follow Up Recommendations  Skilled nursing-short term rehab (<3 hours/day)     Assistance Recommended at Discharge Frequent or constant Supervision/Assistance  Equipment Recommendations  None recommended by PT    Recommendations for Other Services       Precautions / Restrictions Precautions Precautions: Fall Precaution Comments: NWB on LUE Required Braces or Orthoses: Sling Restrictions Weight Bearing Restrictions: Yes LUE Weight Bearing: Non weight bearing Other  Position/Activity Restrictions: sling for support and protection     Mobility  Bed Mobility Overal bed mobility: Needs Assistance Bed Mobility: Supine to Sit;Sit to Supine     Supine to sit: Mod assist Sit to supine: Min assist   General bed mobility comments: total assist to scoot up in bed with bed pad and avoidaince of LUE    Transfers Overall transfer level: Needs assistance   Transfers: Sit to/from Stand Sit to Stand: Min assist           General transfer comment: min assist for short time, no symptoms of light headedness and BP is controlled    Ambulation/Gait               General Gait Details: unable, too painful   Stairs             Wheelchair Mobility    Modified Rankin (Stroke Patients Only)       Balance Overall balance assessment: Needs assistance Sitting-balance support: Feet supported Sitting balance-Leahy Scale: Fair     Standing balance support: Single extremity supported;During functional activity Standing balance-Leahy Scale: Poor                              Cognition Arousal/Alertness: Awake/alert Behavior During Therapy: Anxious;Restless Overall Cognitive Status: Impaired/Different from baseline Area of Impairment: Problem solving;Awareness;Following commands;Safety/judgement                     Memory: Decreased short-term memory Following Commands: Follows one step commands with increased time Safety/Judgement: Decreased awareness of safety Awareness: Intellectual;Emergent Problem Solving:  Slow processing;Requires tactile cues;Requires verbal cues General Comments: pt is unable to verbalize her limits of use of LUE very well, distracted by pain and unable to maneuver without dense instructions esp to scoot up in bed at end of session        Exercises General Exercises - Lower Extremity Ankle Circles/Pumps: AROM;5 reps Quad Sets: AROM;10 reps    General Comments General comments (skin  integrity, edema, etc.): positioning on LUE with pillow after scooting up in bed to get shoulder supported      Pertinent Vitals/Pain Pain Assessment: Faces Faces Pain Scale: Hurts whole lot Pain Location: LUE Pain Descriptors / Indicators: Grimacing;Guarding Pain Intervention(s): Limited activity within patient's tolerance;Monitored during session;Premedicated before session;Repositioned    Home Living                          Prior Function            PT Goals (current goals can now be found in the care plan section) Acute Rehab PT Goals Patient Stated Goal: to get home again    Frequency    Min 5X/week      PT Plan Current plan remains appropriate    Co-evaluation              AM-PAC PT "6 Clicks" Mobility   Outcome Measure  Help needed turning from your back to your side while in a flat bed without using bedrails?: A Lot Help needed moving from lying on your back to sitting on the side of a flat bed without using bedrails?: A Lot Help needed moving to and from a bed to a chair (including a wheelchair)?: A Lot Help needed standing up from a chair using your arms (e.g., wheelchair or bedside chair)?: A Little Help needed to walk in hospital room?: A Lot Help needed climbing 3-5 steps with a railing? : Total 6 Click Score: 12    End of Session Equipment Utilized During Treatment: Other (comment) (sling) Activity Tolerance: Patient tolerated treatment well;Patient limited by pain Patient left: in bed;with call bell/phone within reach;with bed alarm set (declined OOB to chair due to L UE pain) Nurse Communication: Mobility status;Patient requests pain meds PT Visit Diagnosis: Other abnormalities of gait and mobility (R26.89);Muscle weakness (generalized) (M62.81);Pain Pain - Right/Left: Left Pain - part of body: Shoulder;Arm     Time: 2751-7001 PT Time Calculation (min) (ACUTE ONLY): 18 min  Charges:  $Therapeutic Activity: 8-22 mins          Ivar Drape 11/21/2021, 8:48 PM  Samul Dada, PT PhD Acute Rehab Dept. Number: [facility] [id number] and [facility] [phone]

## 2021-11-21 NOTE — TOC Initial Note (Signed)
Transition of Care [facility]) - Initial/Assessment Note    Patient Details  Name: Danielle Clarke MRN: [id number] Date of Birth: 1941-08-31  Transition of Care [facility]) CM/SW Contact:    Amador Cunas, [place] Phone Number: 11/21/2021, 3:26 PM  Clinical Narrative:   Damaris Schooner to pt re PT/OT recommendation for SNF. Pt currently refusing SNF and plans to dc home with [facility] services and support from her dtr and other family members. CM to assist with arranging [facility] and any needed DME.                Expected Discharge Plan: [place] Barriers to Discharge: Continued Medical Work up   Patient Goals and CMS Choice        Expected Discharge Plan and Services Expected Discharge Plan: [place]                                              Prior Living Arrangements/Services   Lives with:: Self, Adult Children, Other (Comment) (dtr lives next door)                   Activities of Daily Living Home Assistive Devices/Equipment: Cane (specify quad or straight), Eyeglasses, Grab bars around toilet, Grab bars in shower, Hand-held shower hose, Raised toilet seat with rails, Shower chair with back, Walker (specify type) ADL Screening (condition at time of admission) Patient's cognitive ability adequate to safely complete daily activities?: Yes Is the patient deaf or have difficulty hearing?: No Does the patient have difficulty seeing, even when wearing glasses/contacts?: No Does the patient have difficulty concentrating, remembering, or making decisions?: No Patient able to express need for assistance with ADLs?: Yes Does the patient have difficulty dressing or bathing?: No Independently performs ADLs?: Yes (appropriate for developmental age) Does the patient have difficulty walking or climbing stairs?: Yes Weakness of Legs: Both Weakness of Arms/Hands: Both  Permission Sought/Granted Permission sought to share information with : Family  Supports Permission granted to share information with : Yes, Verbal Permission Granted              Emotional Assessment       Orientation: : Oriented to Self, Oriented to Place, Oriented to  Time, Oriented to Situation      Admission diagnosis:  Syncope [R55] Fall [W19.XXXA] Near syncope [R55] Closed fracture of proximal end of left humerus, unspecified fracture morphology, initial encounter [S42.202A] Patient Active Problem List   Diagnosis Date Noted   Closed comminuted left humeral fracture 11/19/2021   Prolonged QT interval 11/19/2021   Elevated CK 11/19/2021   CKD (chronic kidney disease), stage III ([place]) 10/14/2020   Abnormality of gait 12/01/2019   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 ([place]) 12/01/2019   Traumatic brain injury with loss of consciousness ([place]) 05/28/2019   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 05/20/2019   Hypertensive crisis    Hypokalemia    Leukocytosis    Lethargy    Labile blood pressure    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    Acute blood loss anemia    Low grade fever    Hypoglycemia    Multiple closed fractures of ribs of right side    Pleural effusion on right    Hypoalbuminemia due to protein-calorie malnutrition (HCC)    Dysphagia  Traumatic cerebral intraparenchymal hematoma 02/14/2019   Pacemaker    Labile blood glucose    Diabetes mellitus type 2 in nonobese [facility])    Dyslipidemia    Essential hypertension    Hypernatremia    Anemia of chronic disease    Hypertensive urgency 02/07/2019   Intracerebral hemorrhage (HCC) 02/07/2019   Intracranial bleeding (HCC) 02/06/2019   Elevated troponin    Abnormal EKG    Bronchitis 08/05/2017   Bilateral carotid artery stenosis    Aortic stenosis 08/25/2015   Cervical stenosis of spinal canal 07/31/2014   Essential hypertension, benign 12/30/2013   Hyperlipidemia 12/30/2013   Toe infection 12/30/2013   Acute renal  failure (HCC) 12/29/2013   Syncope 01/02/2013   Diabetes mellitus type 1 (HCC) 01/02/2013   Bilateral carotid artery disease (HCC) 01/02/2013   GOITER, MULTINODULAR 02/11/2008   PCP:  Catha Gosselin, MD Pharmacy:   [facility] [address], [place] - [address] [place] [address] Phone: [phone] Fax: [phone]     Social Determinants of Health (SDOH) Interventions    Readmission Risk Interventions Readmission Risk Prevention Plan 10/15/2020  Post Dischage Appt Complete  Medication Screening Complete  Transportation Screening Complete  Some recent data might be hidden

## 2021-11-22 LAB — GLUCOSE, CAPILLARY
Glucose-Capillary: 191 mg/dL — ABNORMAL HIGH (ref 70–99)
Glucose-Capillary: 248 mg/dL — ABNORMAL HIGH (ref 70–99)
Glucose-Capillary: 139 mg/dL — ABNORMAL HIGH (ref 70–99)
Glucose-Capillary: 124 mg/dL — ABNORMAL HIGH (ref 70–99)

## 2021-11-22 MED ORDER — HYDROCODONE-ACETAMINOPHEN 5-325 MG PO TABS: 1.0000 | ORAL_TABLET | Freq: Four times a day (QID) | ORAL | 0 refills | Status: DC | PRN

## 2021-11-22 NOTE — Progress Notes (Signed)
Reviewed radiographic imaging (multiple views left humerus and left shoulder).  There is a comminuted left proximal humerus fracture.  Humeral head remains located on glenoid.    Recommend nonweightbearing LUE, no shoulder ROM, simple shoulder sling (no waist strap).  Sling to be positioned so that arm can fully hang and maintain elbow at 90 deg.  May remove sling for hygiene and elbow/forearm/wrist ROM.  Patient should follow up with me in clinic 1 week from this injury for repeat radiographs.  Patient's information given to clinic scheduler.  If patient does not here from scheduler by tomorrow, please contact the clinic at [phone].    Ernestina Columbia M.D. Orthopaedic Surgery [facility]

## 2021-11-22 NOTE — Progress Notes (Signed)
Physical Therapy Treatment Patient Details Name: Danielle Clarke MRN: [id number] DOB: June 28, 1941 Today's Date: 11/22/2021   History of Present Illness Pt is a 80 y.o. female admitted 11/19/21 after syncope and fall resulting in a displaced proximal L humeral neck fx. Head CT negative for acute intracranial changes. Awaiting ortho consult 12/25. PMH includes symptomatic bradycardia/sinus arrest s/p PPM, syncope, VHD with moderate AS, HTN, DM, PVD, HLD, CKD III, ACD.    PT Comments    Pt was seen for mobility on side of bed with HHA and cues for posture and balance.  Talked with her daughter at the same time about coverage for help with pt discharge from rehab, and encouraged her to speak with SW to get the best picture of her options.  Nursing in to report they have contacted SW for a meeting to go over the services for pt after SNF discharge.  Follow along with her as needed, increase standing balance control and work on LE strength, gait quality and safety with all movement with precautions for LUE.  Follow acute PT goals as are written.  Recommendations for follow up therapy are one component of a multi-disciplinary discharge planning process, led by the attending physician.  Recommendations may be updated based on patient status, additional functional criteria and insurance authorization.  Follow Up Recommendations  Skilled nursing-short term rehab (<3 hours/day)     Assistance Recommended at Discharge Frequent or constant Supervision/Assistance  Equipment Recommendations  None recommended by PT    Recommendations for Other Services       Precautions / Restrictions Precautions Precautions: Fall Precaution Comments: NWB on LUE Required Braces or Orthoses: Sling Restrictions Weight Bearing Restrictions: Yes LUE Weight Bearing: Non weight bearing Other Position/Activity Restrictions: sling for support and protection     Mobility  Bed Mobility Overal bed mobility: Needs  Assistance Bed Mobility: Supine to Sit;Sit to Supine     Supine to sit: Min assist Sit to supine: Min assist        Transfers Overall transfer level: Needs assistance Equipment used: 1 person hand held assist Transfers: Sit to/from Stand Sit to Stand: Min assist           General transfer comment: min assist on RUE to power up and maintain balance on side of bed    Ambulation/Gait               General Gait Details: unable with sternal pain   Stairs             Wheelchair Mobility    Modified Rankin (Stroke Patients Only)       Balance Overall balance assessment: Needs assistance;History of Falls Sitting-balance support: Feet supported Sitting balance-Leahy Scale: Fair     Standing balance support: Single extremity supported Standing balance-Leahy Scale: Poor                              Cognition Arousal/Alertness: Awake/alert Behavior During Therapy: Anxious;Restless Overall Cognitive Status: Impaired/Different from baseline Area of Impairment: Problem solving;Awareness;Safety/judgement;Following commands;Attention                   Current Attention Level: Selective Memory: Decreased short-term memory Following Commands: Follows one step commands inconsistently;Follows one step commands with increased time Safety/Judgement: Decreased awareness of deficits;Decreased awareness of safety Awareness: Intellectual Problem Solving: Slow processing;Requires verbal cues;Requires tactile cues General Comments: more tolerant today of being assisted to scoot up the bed on bed pad  Exercises      General Comments General comments (skin integrity, edema, etc.): pt is getting more controlled with descent to sit, able to flex hips and control the timing more now      Pertinent Vitals/Pain Pain Assessment: Faces Faces Pain Scale: Hurts even more Pain Location: LUE Pain Descriptors / Indicators: Grimacing;Guarding Pain  Intervention(s): Monitored during session;Repositioned    Home Living                          Prior Function            PT Goals (current goals can now be found in the care plan section) Acute Rehab PT Goals Patient Stated Goal: to get home again    Frequency    Min 5X/week      PT Plan Current plan remains appropriate    Co-evaluation              AM-PAC PT "6 Clicks" Mobility   Outcome Measure  Help needed turning from your back to your side while in a flat bed without using bedrails?: A Lot Help needed moving from lying on your back to sitting on the side of a flat bed without using bedrails?: A Lot Help needed moving to and from a bed to a chair (including a wheelchair)?: A Little Help needed standing up from a chair using your arms (e.g., wheelchair or bedside chair)?: A Little Help needed to walk in hospital room?: A Lot Help needed climbing 3-5 steps with a railing? : Total 6 Click Score: 13    End of Session Equipment Utilized During Treatment: Gait belt (sling) Activity Tolerance: Patient tolerated treatment well;Patient limited by pain Patient left: in bed;with call bell/phone within reach;with bed alarm set Nurse Communication: Mobility status;Patient requests pain meds PT Visit Diagnosis: Other abnormalities of gait and mobility (R26.89);Muscle weakness (generalized) (M62.81);Pain Pain - Right/Left: Left Pain - part of body: Shoulder;Arm     Time: 0962-8366 PT Time Calculation (min) (ACUTE ONLY): 33 min  Charges:  $Therapeutic Activity: 23-37 mins         Ivar Drape 11/22/2021, 2:17 PM  Samul Dada, PT PhD Acute Rehab Dept. Number: [facility] [id number] and [facility] [phone]

## 2021-11-22 NOTE — Progress Notes (Signed)
PROGRESS NOTE  Danielle Clarke  [id number] DOB: 1941-03-07 DOA: 11/19/2021 PCP: Hulan Fess, MD   Brief Narrative: Danielle Clarke is a 80 y.o. female with a history of sinus arrest, heart block s/p PPM, moderate AS, HTN, T1DM, PVD, HLD, stage IIIb CKD who presented to the ED 12/24 after falling at home. She tripped on the carpet around 12:30am, striking her left shoulder and had immediate severe pain there. As she tried to get up, she was too weak to do so without being able to use the left arm and the pain caused her to pass out. She ended up remaining on the ground unable to get to her phone until her son discovered her about 6.5 hours later. In the ED she was afebrile, hypertensive with CK elevated to 949, SCr 1.11, glucose 375. WBC 12.4k. Pacer interrogation yielded no events. 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 XR confirmed mildly comminuted, displaced proximal humerus fracture for which orthopedics was consulted. The arm was placed in a sling, fentanyl given for pain which caused vomiting, and insulin was administered on admission. Despite IVF overnight, CK has risen to 1880. Echocardiogram revealed vigorous LV systolic function, worsening, now severe, aortic stenosis, mild-moderate mitral regurgitation and stenosis. Physical therapy has recommended SNF placement for rehabilitation due to impairments. The patient and daughter consent to begin bed search to pursue this.   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  - PT/OT consulted, SNF currently recommended  Closed displaced, comminuted left humerus fracture:  - Orthopedics, Dr. Mable Fill formally consulted (see note): NWB LUE, no shoulder ROM, keep in sling w/elbow at 90, can remove for hygiene and intermittently for elbow, wrist ROM. Follow up in 1 week w/repeat XR.   Syncope: Syncope does not appear cardiogenic at this time with normal interrogation of pacemaker and alternative explanation (severe acute pain). Pt is clear that she only passed out when she attempted to move the left arm after her trip and fall.  - Though valvular heart disease appears to have worsened, would not attribute this event to that. Needs cardiology follow up. - Continue telemetry - Note 1-39% carotid stenosis on U/S Sept 2021.   Prolonged QT interval:  - Optimize K, Mg.   - Avoid/minimize provocative medications - Continue telemetry, improved.  Severe aortic stenosis:  Note wide pulse pressure and loud murmur. Has no symptoms of angina or CHF, and doubt this presentation is related to AS. - Needs cardiology follow up.   Mild rhabdomyolysis: Due to prolonged immobility and fracture. Initial worsening, has improved. Taking adequate po.  T1DM: Brittle with patient-endorsed plan for permissive hyperglycemia due to severe symptoms with hypoglycemia. HbA1c 9.4%. - Continue basal-bolus (mealtime + SSI) insulin. Pt is type 1 and very sensitive.   - Follow up with Dr. Chalmers Cater after discharge.  Demand myocardial ischemia: Very mild troponin elevated (hs-Tn 18 > 19) without ischemic ST segment changes on ECG nor chest pain.  No inpatient work up currently planned.   CHB s/p PPM 2020  Stage IIIb CKD:  - Improved, Cr 0.92. Baseline appears ~1.   Leukocytosis: Without fever or nidus of infection. Likely reactive to trauma, resolved. - Monitor off abx at this time.   HLD:  - Continue statin at  discharge  HTN:  - Continue prn medication  Anemia of chronic disease: No bleeding noted at this time. Stable.  DVT prophylaxis: Lovenox, decreased to 30mg  q24h Code Status: Full Family Communication: None at bedside. Disposition Plan:  Status is: Inpatient  The patient remains Giving IVF for rhabdomyolysis. Unsafe DC: requires 24/7 assistance not available at home. Rehab stay is recommended to return to PLOF.  Consultants:  Orthopedics  Procedures:  None  Antimicrobials: None   Subjective: Pain is stable, intermittent only with movement but excruciating in the left shoulder without radiation. Lives alone.   Objective: Vitals:   11/21/21 1711 11/21/21 1931 11/22/21 0550 11/22/21 1114  BP: 127/68 (!) 143/53 (!) 157/55 (!) 141/45  Pulse: 79 87 77 75  Resp: 15 16 16 16   Temp: 98.2 F (36.8 C) 98.5 F (36.9 C) 98.5 F (36.9 C) 98 F (36.7 C)  TempSrc: Oral Oral Oral Oral  SpO2: 98% 100% 98% 97%  Weight:   54.4 kg   Height:        Intake/Output Summary (Last 24 hours) at 11/22/2021 1150 Last data filed at 11/22/2021 0551 Gross per 24 hour  Intake 1155.94 ml  Output 300 ml  Net 855.94 ml   Filed Weights   11/20/21 0507 11/21/21 0508 11/22/21 0550  Weight: 54 kg 57.5 kg 54.4 kg   Gen: 80 y.o. female in no distress Pulm: Nonlabored breathing room air. Clear. CV: Regular rate and rhythm. III/VI holosystolic murmur, no rub, or gallop. No JVD, no dependent edema. GI: Abdomen soft, non-tender, non-distended, with normoactive bowel sounds.  Ext: Warm, dry LUE in sling with severe pain to palpation at left shoulder or with any ROM. ROM intact distally, sensation and strength normal distally. Brisk cap refill.  Skin: No new rashes, lesions or ulcers on visualized skin. Ecchymoses anterior left shoulder stable. Neuro: Alert and oriented. No focal neurological deficits. Psych: Judgement and insight appear fair. Mood euthymic & affect congruent. Behavior is appropriate.     Data Reviewed: I have personally reviewed following labs and imaging studies  CBC: Recent Labs  Lab 11/19/21 1115 11/20/21 0314 11/21/21 0504  WBC 12.4* 10.1 7.9  NEUTROABS 11.1*  --   --   HGB 11.6* 9.2* 9.3*  HCT 36.6 28.4* 27.9*  MCV 89.7 88.8 88.9  PLT 202 157 [id number]*   Basic Metabolic Panel: Recent Labs  Lab 11/19/21 1115 11/19/21 1500 11/20/21 0314 11/21/21 0504  NA 137  --  138 134*  K 5.0  --  4.4 4.2  CL 100  --  104 105  CO2 23  --  26 22  GLUCOSE 375*  --  216* 204*  BUN 19  --  22 18  CREATININE 1.11*  --  1.14* 0.92  CALCIUM 9.2  --  8.4* 7.8*  MG  --  1.9  --  1.8   GFR: Estimated Creatinine Clearance: 35.6 mL/min (by C-G formula based on SCr of 0.92 mg/dL). Liver Function Tests: No results for input(s): AST, ALT, ALKPHOS, BILITOT, PROT, ALBUMIN in the last 168 hours. No results for input(s): LIPASE, AMYLASE in the last 168 hours. No results for input(s): AMMONIA in the last 168 hours.  Coagulation Profile: No results for input(s): INR, PROTIME in the last 168 hours. Cardiac Enzymes: Recent Labs  Lab 11/19/21 1115 11/20/21 0314 11/21/21 0504  CKTOTAL 949* 1,880* 867*   BNP (last 3 results) No results for input(s): PROBNP in the last 8760 hours. HbA1C: No results for input(s): HGBA1C in the last 72 hours. CBG: Recent Labs  Lab 11/21/21 1113 11/21/21 1709 11/21/21 2148 11/22/21 0736 11/22/21 1116  GLUCAP 325* 293* 131* 191* 248*   Lipid Profile: No results for input(s): CHOL, HDL, LDLCALC, TRIG, CHOLHDL, LDLDIRECT in the last 72 hours. Thyroid Function Tests: Recent Labs    11/19/21 1519  TSH 0.910   Anemia Panel: No results for input(s): VITAMINB12, FOLATE, FERRITIN, TIBC, IRON, RETICCTPCT in the last 72 hours. Urine analysis:    Component Value Date/Time   COLORURINE YELLOW 10/14/2020 1216   APPEARANCEUR CLEAR 10/14/2020 1216   LABSPEC 1.009 10/14/2020 1216   PHURINE 6.0 10/14/2020 1216   GLUCOSEU >=500 (A) 10/14/2020 1216    HGBUR NEGATIVE 10/14/2020 1216   BILIRUBINUR NEGATIVE 10/14/2020 1216   KETONESUR 5 (A) 10/14/2020 1216   PROTEINUR NEGATIVE 10/14/2020 1216   UROBILINOGEN 0.2 10/07/2014 1808   NITRITE NEGATIVE 10/14/2020 1216   LEUKOCYTESUR NEGATIVE 10/14/2020 1216   Recent Results (from the past 240 hour(s))  Resp Panel by RT-PCR (Flu A&B, Covid) Nasopharyngeal Swab     Status: None   Collection Time: 11/19/21 11:15 AM   Specimen: Nasopharyngeal Swab; Nasopharyngeal(NP) swabs in vial transport medium  Result Value Ref Range Status   SARS Coronavirus 2 by RT PCR NEGATIVE NEGATIVE Final    Comment: (NOTE) SARS-CoV-2 target nucleic acids are NOT DETECTED.  The SARS-CoV-2 RNA is generally detectable in upper respiratory specimens during the acute phase of infection. The lowest concentration of SARS-CoV-2 viral copies this assay can detect is 138 copies/mL. A negative result does not preclude SARS-Cov-2 infection and should not be used as the sole basis for treatment or other patient management decisions. A negative result may occur with  improper specimen collection/handling, submission of specimen other than nasopharyngeal swab, presence of viral mutation(s) within the areas targeted by this assay, and inadequate number of viral copies(<138 copies/mL). A negative result must be combined with clinical observations, patient history, and epidemiological information. The expected result is Negative.  Fact Sheet for Patients:  BloggerCourse.com  Fact Sheet for Healthcare Providers:  SeriousBroker.it  This test is no t yet approved or cleared by the [place] FDA and  has been authorized for detection and/or diagnosis of SARS-CoV-2 by FDA under an Emergency Use Authorization (EUA). This EUA will remain  in effect (meaning this test can be used) for the duration of the COVID-19 declaration under Section 564(b)(1) of the Act, 21 U.S.C.section  360bbb-3(b)(1), unless the authorization is terminated  or revoked sooner.       Influenza A by PCR NEGATIVE NEGATIVE Final   Influenza B by PCR NEGATIVE NEGATIVE Final    Comment: (NOTE) The Xpert Xpress SARS-CoV-2/FLU/RSV plus assay is intended as an aid in the diagnosis of influenza from Nasopharyngeal swab specimens and should not be used as a sole basis for treatment. Nasal washings and aspirates are unacceptable for Xpert Xpress SARS-CoV-2/FLU/RSV testing.  Fact Sheet for Patients: BloggerCourse.com  Fact Sheet for Healthcare Providers: SeriousBroker.it  This test is not yet approved or cleared by the [place] FDA and has been authorized for detection and/or diagnosis of SARS-CoV-2 by FDA under an Emergency Use Authorization (EUA). This EUA will remain  in effect (meaning this test can be used) for the duration of the COVID-19 declaration under Section 564(b)(1) of the Act, 21 U.S.C. section 360bbb-3(b)(1), unless the authorization is terminated or revoked.  Performed at [facility], [place] [address] [place], [place] [address]       Radiology Studies: No results found.  Scheduled Meds:  enoxaparin (LOVENOX) injection  30 mg Subcutaneous Q24H   insulin aspart  0-5 Units Subcutaneous QHS   insulin aspart  0-6 Units Subcutaneous TID WC   insulin aspart  2 Units Subcutaneous TID WC   insulin glargine-yfgn  12 Units Subcutaneous q morning   loratadine  10 mg Oral Daily   sodium chloride flush  3 mL Intravenous Q12H   Continuous Infusions:  sodium chloride 75 mL/hr at 11/22/21 0553     LOS: 1 day   Time spent: 25 minutes.  Patrecia Pour, MD [facility] [email or web address] 11/22/2021, 11:50 AM

## 2021-11-22 NOTE — Progress Notes (Signed)
Inpatient Diabetes Program Recommendations  AACE/ADA: New Consensus Statement on Inpatient Glycemic Control (2015)  Target Ranges:  Prepandial:   less than 140 mg/dL      Peak postprandial:   less than 180 mg/dL (1-2 hours)      Critically ill patients:  140 - 180 mg/dL   Lab Results  Component Value Date   GLUCAP 248 (H) 11/22/2021   HGBA1C 9.4 (H) 10/14/2020    Review of Glycemic Control  Latest Reference Range & Units 11/22/21 07:36 11/22/21 11:16  Glucose-Capillary 70 - 99 mg/dL 902 (H) 409 (H)  (H): Data is abnormally high   Diabetes history: DM1(does not make insulin.  Needs correction, basal and meal coverage) Outpatient Diabetes medications:  Lantus 12 units QD, Humalog 0-6 units TID Current orders for Inpatient glycemic control:  Semglee 12 units QD, Novolog 0-6 units TID, Novolog 2 units TID  Inpatient Diabetes Program Recommendations:    Novolog 3-4 units TID with meals if eats at least 50%.  Will continue to follow while inpatient.  Thank you, Dulce Sellar, RN, BSN Diabetes Coordinator Inpatient Diabetes Program [phone] (team pager from 8a-5p)

## 2021-11-22 NOTE — TOC Progression Note (Signed)
Transition of Care [facility]) - Progression Note    Patient Details  Name: Danielle Clarke MRN: [id number] Date of Birth: [birth year]  Transition of Care [facility]) CM/SW Contact  Lawerance Sabal, RN Phone Number: 11/22/2021, 11:44 AM  Clinical Narrative:    Sherron Monday w patient and daughter at bedside. Daughter states that she will only be home for 4 days and then flys back out for work this week, she is a Financial controller.  Patient lives at home alone. When speaking with the patient she states she gets confused a lot, supervision would be needed. Notified MD and CSW.  CSW will initiate SNF workup, offer choice and get authorization prior to DC. This will require 1-2 days.     Expected Discharge Plan: Skilled Nursing Facility Barriers to Discharge: Continued Medical Work up  Expected Discharge Plan and Services Expected Discharge Plan: Skilled Nursing Facility     Post Acute Care Choice: Home Health Living arrangements for the past 2 months: Single Family Home Expected Discharge Date: 11/22/21                                     Social Determinants of Health (SDOH) Interventions    Readmission Risk Interventions Readmission Risk Prevention Plan 10/15/2020  Post Dischage Appt Complete  Medication Screening Complete  Transportation Screening Complete  Some recent data might be hidden

## 2021-11-22 NOTE — TOC CAGE-AID Note (Signed)
Transition of Care [facility]) - CAGE-AID Screening   Patient Details  Name: Danielle Clarke MRN: [id number] Date of Birth: [birth year]  Transition of Care [facility]) CM/SW Contact:    Katha Hamming, RN Phone [phone] 11/22/2021, 9:45 PM   Clinical Narrative:  Patient reports approximately two glasses of wine per week. No drug use. Patient gets confused a lot per family. No need for resources at this time.  CAGE-AID Screening:    Have You Ever Felt You Ought to Cut Down on Your Drinking or Drug Use?: No Have People Annoyed You By Critizing Your Drinking Or Drug Use?: No Have You Felt Bad Or Guilty About Your Drinking Or Drug Use?: No Have You Ever Had a Drink or Used Drugs First Thing In The Morning to Steady Your Nerves or to Get Rid of a Hangover?: No CAGE-AID Score: 0  Substance Abuse Education Offered: No

## 2021-11-23 DIAGNOSIS — N182 Chronic kidney disease, stage 2 (mild): Secondary | ICD-10-CM

## 2021-11-23 LAB — GLUCOSE, CAPILLARY
Glucose-Capillary: 185 mg/dL — ABNORMAL HIGH (ref 70–99)
Glucose-Capillary: 149 mg/dL — ABNORMAL HIGH (ref 70–99)
Glucose-Capillary: 246 mg/dL — ABNORMAL HIGH (ref 70–99)
Glucose-Capillary: 252 mg/dL — ABNORMAL HIGH (ref 70–99)
Glucose-Capillary: 173 mg/dL — ABNORMAL HIGH (ref 70–99)

## 2021-11-23 MED ORDER — INSULIN ASPART 100 UNIT/ML IJ SOLN
3.0000 [IU] | Freq: Three times a day (TID) | INTRAMUSCULAR | Status: DC
  Administered 2021-11-23 – 2021-11-25 (×6): 3 [IU] via SUBCUTANEOUS

## 2021-11-23 MED ORDER — POLYETHYLENE GLYCOL 3350 17 G PO PACK
17.0000 g | PACK | Freq: Two times a day (BID) | ORAL | Status: AC
  Filled 2021-11-23 (×4): qty 1

## 2021-11-23 NOTE — TOC Progression Note (Signed)
Transition of Care [facility]) - Progression Note    Patient Details  Name: Danielle Clarke MRN: [id number] Date of Birth: [birth year]  [facility]) CM/SW Contact  Jimmy Picket, [place] Phone Number: 11/23/2021, 1:59 PM  Clinical Narrative:     CSW spoke to pt and daughter at bedside. They stated that they feel that SNF will better suited for pt than going home. CSW gave family bed offers. Family chose [facility]. [facility] stated they can accept pt at discharge.   CSW started insurance auth. Berkley Harvey is currently pending.   Expected Discharge Plan: Skilled Nursing Facility Barriers to Discharge: Continued Medical Work up  Expected Discharge Plan and Services Expected Discharge Plan: Skilled Nursing Facility     Post Acute Care Choice: Home Health Living arrangements for the past 2 months: Single Family Home Expected Discharge Date: 11/22/21                                     Social Determinants of Health (SDOH) Interventions    Readmission Risk Interventions Readmission Risk Prevention Plan 10/15/2020  Post Dischage Appt Complete  Medication Screening Complete  Transportation Screening Complete  Some recent data might be hidden

## 2021-11-23 NOTE — Progress Notes (Addendum)
[facility] PROGRESS NOTE    Progress Note  Danielle Clarke  [id number] DOB: 1941/11/11 DOA: 11/19/2021 PCP: Hulan Fess, MD     Brief Narrative:   Danielle Clarke is an 80 y.o. female past medical history sinus arrest, complete heart block status post pacemaker, moderate aortic stenosis, essential hypertension, diabetes mellitus type 1, chronic kidney disease stage IIIb comes into the ED after a mechanical fall at home she tried to get up but she was too weak to do so she is pending the floor about 6.5 hours she was brought into the ED she was found to be hypertensive, rhabdomyolysis, blood glucose of 400, white blood cell count of 12, her pacer was interrogated there were no events.  CT of the head showed no acute intracranial abnormalities.  X-ray of the left upper extremity showed displaced humeral fracture for which orthopedics was consulted she was placed in a sling, she was started on IV fluids her CK continues to rise despite IV fluid hydration, 2D echo showed a EF of 60% and grade 1 diastolic heart failure, with severe aortic stenosis with a mean gradient of 39 mmHg with moderate calcified aortic valve    Assessment/Plan:   Syncope and collapse: Interrogation of her pacemaker showed no acute events. Stated her acute pain caused her to pass out after she was on the floor. Her 2D echo shows severe aortic stenosis will notify cardiology.  Mechanical fall leading to left shoulder fracture: Orthopedic surgery evaluated the patient recommended t sling keep the elbow at 90 degrees follow-up with them as an outpatient follow-up x-ray. Physical therapy evaluated the patient, she will need skilled nursing facility.  Rhabdomyolysis nontraumatic: Her on IV fluids and her CK has significantly improved. We will KVO IV fluids.   Prolonged QTC: Check a potassium greater than 4 magnesium greater than 2.  Severe aortic stenosis: Will need to follow-up with cardiology as an  outpatient.  Diabetes mellitus type 1: With an A1c of 9.4.  She is currently on long-acting insulin plus sliding scale blood glucose trending up  Elevated troponins: In the setting of rhabdomyolysis and chronic kidney disease stage III Has any chest pain EKG showed no signs of ischemia.  Stage IIIb chronic kidney disease: Her creatinine appears to be at baseline.  Leukocytosis: Likely reactive due to trauma has remained afebrile now resolved.  Hyperlipidemia Continue statins.  Essential hypertension: Continue medications as needed.    DVT prophylaxis: lovenx Family Communication:daughter Status is: Inpatient  Remains inpatient appropriate because: Awaiting skilled nursing facility placement.    Code Status:     Code Status Orders  (From admission, onward)           Start     Ordered   11/19/21 1518  Full code  Continuous        11/19/21 1520           Code Status History     Date Active Date Inactive Code Status Order ID Comments User Context   10/14/2020 1755 10/16/2020 1818 Full Code [id number]  Charlynne Cousins, MD Inpatient   02/14/2019 1449 02/28/2019 1524 Full Code [id number]  Cathlyn Parsons, PA-C Inpatient   02/14/2019 1449 02/14/2019 1449 Full Code [id number]  Cathlyn Parsons, PA-C Inpatient   02/07/2019 0036 02/14/2019 1440 Full Code [id number]  Rise Patience, MD ED   08/05/2017 1448 08/08/2017 1814 Full Code [id number]  Georgette Shell, MD ED   01/04/2017 1819 01/07/2017 1438 Full Code [id number]  Abrol,  Germain Osgood, MD ED   07/31/2014 1654 08/05/2014 2030 Full Code [id number]  Karn Cassis, MD Inpatient   12/29/2013 2134 01/01/2014 1811 Full Code [id number]  Hillary Bow, DO ED   01/02/2013 1835 01/03/2013 2131 Full Code [id number]  Ezzard Flax, RN Inpatient      Advance Directive Documentation    Flowsheet Row Most Recent Value  Type of Advance Directive Living will  Pre-existing out of facility DNR order (yellow form or pink MOST form) --   "MOST" Form in Place? --         IV Access:   Peripheral IV   Procedures and diagnostic studies:   No results found.   Medical Consultants:   None.   Subjective:    Danielle Clarke she relates her pain is controlled.  Objective:    Vitals:   11/22/21 0550 11/22/21 1114 11/22/21 2038 11/23/21 0552  BP: (!) 157/55 (!) 141/45 (!) 156/49 (!) 146/49  Pulse: 77 75 81 86  Resp: 16 16 16 18   Temp: 98.5 F (36.9 C) 98 F (36.7 C) 98.2 F (36.8 C) 98.2 F (36.8 C)  TempSrc: Oral Oral Oral Oral  SpO2: 98% 97% 100% 96%  Weight: 54.4 kg     Height:       SpO2: 96 % O2 Flow Rate (L/min): 1 L/min   Intake/Output Summary (Last 24 hours) at 11/23/2021 1006 Last data filed at 11/23/2021 0658 Gross per 24 hour  Intake 120 ml  Output --  Net 120 ml   Filed Weights   11/20/21 0507 11/21/21 0508 11/22/21 0550  Weight: 54 kg 57.5 kg 54.4 kg    Exam: General exam: In no acute distress. Respiratory system: Good air movement and clear to auscultation. Cardiovascular system: S1 & S2 heard, RRR. No JVD. Gastrointestinal system: Abdomen is nondistended, soft and nontender.  Extremities: No pedal edema. Skin: No rashes, lesions or ulcers Psychiatry: Judgement and insight appear normal. Mood & affect appropriate.    Data Reviewed:    Labs: Basic Metabolic Panel: Recent Labs  Lab 11/19/21 1115 11/19/21 1500 11/20/21 0314 11/21/21 0504  NA 137  --  138 134*  K 5.0  --  4.4 4.2  CL 100  --  104 105  CO2 23  --  26 22  GLUCOSE 375*  --  216* 204*  BUN 19  --  22 18  CREATININE 1.11*  --  1.14* 0.92  CALCIUM 9.2  --  8.4* 7.8*  MG  --  1.9  --  1.8   GFR Estimated Creatinine Clearance: 35.6 mL/min (by C-G formula based on SCr of 0.92 mg/dL). Liver Function Tests: No results for input(s): AST, ALT, ALKPHOS, BILITOT, PROT, ALBUMIN in the last 168 hours. No results for input(s): LIPASE, AMYLASE in the last 168 hours. No results for input(s): AMMONIA in  the last 168 hours. Coagulation profile No results for input(s): INR, PROTIME in the last 168 hours. COVID-19 Labs  No results for input(s): DDIMER, FERRITIN, LDH, CRP in the last 72 hours.  Lab Results  Component Value Date   SARSCOV2NAA NEGATIVE 11/19/2021   SARSCOV2NAA NEGATIVE 10/14/2020    CBC: Recent Labs  Lab 11/19/21 1115 11/20/21 0314 11/21/21 0504  WBC 12.4* 10.1 7.9  NEUTROABS 11.1*  --   --   HGB 11.6* 9.2* 9.3*  HCT 36.6 28.4* 27.9*  MCV 89.7 88.8 88.9  PLT 202 157 145*   Cardiac Enzymes: Recent Labs  Lab 11/19/21 1115 11/20/21 0314  11/21/21 0504  CKTOTAL 949* 1,880* 867*   BNP (last 3 results) No results for input(s): PROBNP in the last 8760 hours. CBG: Recent Labs  Lab 11/22/21 0736 11/22/21 1116 11/22/21 1555 11/22/21 2104 11/23/21 0741  GLUCAP 191* 248* 139* 124* 185*   D-Dimer: No results for input(s): DDIMER in the last 72 hours. Hgb A1c: No results for input(s): HGBA1C in the last 72 hours. Lipid Profile: No results for input(s): CHOL, HDL, LDLCALC, TRIG, CHOLHDL, LDLDIRECT in the last 72 hours. Thyroid function studies: No results for input(s): TSH, T4TOTAL, T3FREE, THYROIDAB in the last 72 hours.  Invalid input(s): FREET3 Anemia work up: No results for input(s): VITAMINB12, FOLATE, FERRITIN, TIBC, IRON, RETICCTPCT in the last 72 hours. Sepsis Labs: Recent Labs  Lab 11/19/21 1115 11/20/21 0314 11/21/21 0504  WBC 12.4* 10.1 7.9   Microbiology Recent Results (from the past 240 hour(s))  Resp Panel by RT-PCR (Flu A&B, Covid) Nasopharyngeal Swab     Status: None   Collection Time: 11/19/21 11:15 AM   Specimen: Nasopharyngeal Swab; Nasopharyngeal(NP) swabs in vial transport medium  Result Value Ref Range Status   SARS Coronavirus 2 by RT PCR NEGATIVE NEGATIVE Final    Comment: (NOTE) SARS-CoV-2 target nucleic acids are NOT DETECTED.  The SARS-CoV-2 RNA is generally detectable in upper respiratory specimens during the acute  phase of infection. The lowest concentration of SARS-CoV-2 viral copies this assay can detect is 138 copies/mL. A negative result does not preclude SARS-Cov-2 infection and should not be used as the sole basis for treatment or other patient management decisions. A negative result may occur with  improper specimen collection/handling, submission of specimen other than nasopharyngeal swab, presence of viral mutation(s) within the areas targeted by this assay, and inadequate number of viral copies(<138 copies/mL). A negative result must be combined with clinical observations, patient history, and epidemiological information. The expected result is Negative.  Fact Sheet for Patients:  EntrepreneurPulse.com.au  Fact Sheet for Healthcare Providers:  IncredibleEmployment.be  This test is no t yet approved or cleared by the [place] FDA and  has been authorized for detection and/or diagnosis of SARS-CoV-2 by FDA under an Emergency Use Authorization (EUA). This EUA will remain  in effect (meaning this test can be used) for the duration of the COVID-19 declaration under Section 564(b)(1) of the Act, 21 U.S.C.section 360bbb-3(b)(1), unless the authorization is terminated  or revoked sooner.       Influenza A by PCR NEGATIVE NEGATIVE Final   Influenza B by PCR NEGATIVE NEGATIVE Final    Comment: (NOTE) The Xpert Xpress SARS-CoV-2/FLU/RSV plus assay is intended as an aid in the diagnosis of influenza from Nasopharyngeal swab specimens and should not be used as a sole basis for treatment. Nasal washings and aspirates are unacceptable for Xpert Xpress SARS-CoV-2/FLU/RSV testing.  Fact Sheet for Patients: EntrepreneurPulse.com.au  Fact Sheet for Healthcare Providers: IncredibleEmployment.be  This test is not yet approved or cleared by the [place] FDA and has been authorized for detection and/or diagnosis of  SARS-CoV-2 by FDA under an Emergency Use Authorization (EUA). This EUA will remain in effect (meaning this test can be used) for the duration of the COVID-19 declaration under Section 564(b)(1) of the Act, 21 U.S.C. section 360bbb-3(b)(1), unless the authorization is terminated or revoked.  Performed at [facility], [place] [address], [place], [place] [address]      Medications:    enoxaparin (LOVENOX) injection  30 mg Subcutaneous Q24H   insulin aspart  0-5  Units Subcutaneous QHS   insulin aspart  0-6 Units Subcutaneous TID WC   insulin aspart  2 Units Subcutaneous TID WC   insulin glargine-yfgn  12 Units Subcutaneous q morning   loratadine  10 mg Oral Daily   sodium chloride flush  3 mL Intravenous Q12H   Continuous Infusions:    LOS: 2 days   Charlynne Cousins  [facility]  11/23/2021, 10:06 AM

## 2021-11-23 NOTE — Progress Notes (Signed)
Physical Therapy Treatment Patient Details Name: Danielle Clarke MRN: [id number] DOB: [birth year] Today's Date: 11/23/2021   History of Present Illness Pt is a 80 y.o. female admitted 11/19/21 after syncope and fall resulting in a displaced proximal L humeral neck fx. Head CT negative for acute intracranial changes. Awaiting ortho consult 12/25. PMH includes symptomatic bradycardia/sinus arrest s/p PPM, syncope, VHD with moderate AS, HTN, DM, PVD, HLD, CKD III, ACD.    PT Comments    Pt is making good, steady progress with mobility as she was able to progress to taking a few steps today with minA and R UE support. However, she remains limited in gait and standing time due to increased L shoulder pain with standing, even though the sling was adjusted multiple times. Her orthostatics were negative today, but she did report x1 episode of lightheadedness when her L shoulder pain increased in standing. Educated pt on exiting/entering R side of bed due to increased ease as she is able to use her R UE more on that side. Will continue to follow acutely. Current recommendations remain appropriate.    Recommendations for follow up therapy are one component of a multi-disciplinary discharge planning process, led by the attending physician.  Recommendations may be updated based on patient status, additional functional criteria and insurance authorization.  Follow Up Recommendations  Skilled nursing-short term rehab (<3 hours/day)     Assistance Recommended at Discharge Frequent or constant Supervision/Assistance  Equipment Recommendations  None recommended by PT    Recommendations for Other Services       Precautions / Restrictions Precautions Precautions: Fall Precaution Comments: NWB on LUE (no shoulder ROM) Required Braces or Orthoses: Sling Restrictions Weight Bearing Restrictions: Yes LUE Weight Bearing: Non weight bearing Other Position/Activity Restrictions: sling for support and  protection     Mobility  Bed Mobility Overal bed mobility: Needs Assistance Bed Mobility: Supine to Sit     Supine to sit: Min guard;HOB elevated     General bed mobility comments: Pt able to exit R EOB with HOB elevated with increased time and min guard assist for safety, using R arm to pull on R bed rail to scoot hips to EOB.    Transfers Overall transfer level: Needs assistance Equipment used: 1 person hand held assist Transfers: Sit to/from Stand;Bed to chair/wheelchair/BSC Sit to Stand: Min assist     Step pivot transfers: Min assist     General transfer comment: MinA under R elbow for pt to use PT as support to power up to stand 1x from EOB and 1x from recliner. MinA to steady with stand step transfer to L bed > recliner.    Ambulation/Gait Ambulation/Gait assistance: Min assist Gait Distance (Feet): 2 Feet Assistive device: 1 person hand held assist Gait Pattern/deviations: Step-through pattern;Decreased stride length;Trunk flexed Gait velocity: reduced Gait velocity interpretation: <1.31 ft/sec, indicative of household ambulator   General Gait Details: Pt taking a few lateral steps and very short anterior-posterior steps with R UE support, minA for stability and directing. Pt requesting to sit after standing briefly due to too much L shoulder pain in standing, even with sling adjusted several times.   Stairs             Wheelchair Mobility    Modified Rankin (Stroke Patients Only)       Balance Overall balance assessment: Needs assistance;History of Falls Sitting-balance support: Feet supported Sitting balance-Leahy Scale: Fair Sitting balance - Comments: Static sitting EOB with supervision for safety.   Standing  balance support: Single extremity supported Standing balance-Leahy Scale: Poor Standing balance comment: Reliant on RUE support for balance                            Cognition Arousal/Alertness: Awake/alert Behavior  During Therapy: Anxious Overall Cognitive Status: Impaired/Different from baseline Area of Impairment: Problem solving;Awareness;Safety/judgement;Following commands;Attention                   Current Attention Level: Selective Memory: Decreased short-term memory Following Commands: Follows one step commands with increased time;Follows one step commands consistently Safety/Judgement: Decreased awareness of deficits;Decreased awareness of safety Awareness: Emergent Problem Solving: Slow processing;Requires verbal cues;Requires tactile cues;Difficulty sequencing General Comments: Pt anxious with mobility due to L shoulder pain. Needs cues to sequence and direct pt with all functional mobility, but follows simple commands consistently with increased time.        Exercises      General Comments General comments (skin integrity, edema, etc.): BP stable throughout, but lightheadedness reported when pain increased in standing 1x; discussed SNF options with pt and daughter; educated pt to mobilize L elbow and wrist/hand but not shoulder      Pertinent Vitals/Pain Pain Assessment: Faces Faces Pain Scale: Hurts even more Pain Location: L shoulder Pain Descriptors / Indicators: Grimacing;Guarding Pain Intervention(s): Limited activity within patient's tolerance;Monitored during session;Repositioned    Home Living                          Prior Function            PT Goals (current goals can now be found in the care plan section) Acute Rehab PT Goals Patient Stated Goal: to get better PT Goal Formulation: With patient/family Time For Goal Achievement: 12/04/21 Potential to Achieve Goals: Fair Progress towards PT goals: Progressing toward goals    Frequency    Min 5X/week      PT Plan Current plan remains appropriate    Co-evaluation              AM-PAC PT "6 Clicks" Mobility   Outcome Measure  Help needed turning from your back to your side while  in a flat bed without using bedrails?: A Lot Help needed moving from lying on your back to sitting on the side of a flat bed without using bedrails?: A Little Help needed moving to and from a bed to a chair (including a wheelchair)?: A Little Help needed standing up from a chair using your arms (e.g., wheelchair or bedside chair)?: A Little Help needed to walk in hospital room?: Total Help needed climbing 3-5 steps with a railing? : Total 6 Click Score: 13    End of Session Equipment Utilized During Treatment: Other (comment) (sling) Activity Tolerance: Patient tolerated treatment well;Patient limited by pain Patient left: with call bell/phone within reach;in chair;with chair alarm set Nurse Communication: Mobility status;Other (comment) (BP to NT) PT Visit Diagnosis: Other abnormalities of gait and mobility (R26.89);Muscle weakness (generalized) (M62.81);Pain;Unsteadiness on feet (R26.81);Difficulty in walking, not elsewhere classified (R26.2) Pain - Right/Left: Left Pain - part of body: Shoulder;Arm     Time: 4627-0350 PT Time Calculation (min) (ACUTE ONLY): 33 min  Charges:  $Therapeutic Activity: 23-37 mins                     Raymond Gurney, PT, DPT Acute Rehabilitation Services  Pager: [phone] Office: [phone]    Darnelle Maffucci  M Pettis 11/23/2021, 12:32 PM

## 2021-11-23 NOTE — Progress Notes (Signed)
OT Cancellation Note  Patient Details Name: RHYAN WOLTERS MRN: [id number] DOB: 09-29-1941   Cancelled Treatment:    Reason Eval/Treat Not Completed: Patient declined, no reason specified;Other (comment) With initial OT attempt, pt eating lunch. Followed up shortly after this with pt just transferring back to bed. Left handout for AROM of L hand/wrist/elbow in pt room. Will follow-up likely tomorrow for OT session.   Lorre Munroe 11/23/2021, 1:22 PM

## 2021-11-23 NOTE — Plan of Care (Signed)
  Problem: Health Behavior/Discharge Planning: Goal: Ability to manage health-related needs will improve Outcome: Progressing   Problem: Clinical Measurements: Goal: Ability to maintain clinical measurements within normal limits will improve Outcome: Progressing Goal: Will remain free from infection Outcome: Progressing Goal: Diagnostic test results will improve Outcome: Progressing   

## 2021-11-24 LAB — RESP PANEL BY RT-PCR (FLU A&B, COVID) ARPGX2
Influenza A by PCR: NEGATIVE
Influenza B by PCR: NEGATIVE
SARS Coronavirus 2 by RT PCR: NEGATIVE

## 2021-11-24 LAB — GLUCOSE, CAPILLARY
Glucose-Capillary: 232 mg/dL — ABNORMAL HIGH (ref 70–99)
Glucose-Capillary: 188 mg/dL — ABNORMAL HIGH (ref 70–99)
Glucose-Capillary: 229 mg/dL — ABNORMAL HIGH (ref 70–99)
Glucose-Capillary: 153 mg/dL — ABNORMAL HIGH (ref 70–99)

## 2021-11-24 MED ORDER — OXYCODONE-ACETAMINOPHEN 5-325 MG PO TABS
1.0000 | ORAL_TABLET | ORAL | Status: DC | PRN
Start: 2021-11-24 — End: 2021-11-25
  Administered 2021-11-24 (×2): 2 via ORAL
  Filled 2021-11-24 (×2): qty 2

## 2021-11-24 MED ORDER — INSULIN GLARGINE-YFGN 100 UNIT/ML ~~LOC~~ SOLN
8.0000 [IU] | Freq: Two times a day (BID) | SUBCUTANEOUS | Status: DC
  Administered 2021-11-24 – 2021-11-25 (×2): 8 [IU] via SUBCUTANEOUS
  Filled 2021-11-24 (×3): qty 0.08

## 2021-11-24 NOTE — Care Management Important Message (Signed)
Important Message  Patient Details  Name: Danielle Clarke MRN: [id number] Date of Birth: Feb 26, 1941   Medicare Important Message Given:  Yes     Sherilyn Banker 11/24/2021, 12:36 PM

## 2021-11-24 NOTE — Progress Notes (Signed)
[facility] PROGRESS NOTE    Progress Note  Danielle Clarke  [id number] DOB: Oct 02, 1941 DOA: 11/19/2021 PCP: Hulan Fess, MD     Brief Narrative:   Danielle Clarke is an 80 y.o. female past medical history sinus arrest, complete heart block status post pacemaker, moderate aortic stenosis, essential hypertension, diabetes mellitus type 1, chronic kidney disease stage IIIb comes into the ED after a mechanical fall at home she tried to get up but she was too weak to do so she is pending the floor about 6.5 hours she was brought into the ED she was found to be hypertensive, rhabdomyolysis, blood glucose of 400, white blood cell count of 12, her pacer was interrogated there were no events.  CT of the head showed no acute intracranial abnormalities.  X-ray of the left upper extremity showed displaced humeral fracture for which orthopedics was consulted she was placed in a sling, she was started on IV fluids her CK continues to rise despite IV fluid hydration, 2D echo showed a EF of 60% and grade 1 diastolic heart failure, with severe aortic stenosis with a mean gradient of 39 mmHg with moderate calcified aortic valve    Assessment/Plan:   Syncope and collapse: Interrogation of her pacemaker showed no acute events. Stated her acute pain caused her to pass out after she was on the floor. Her 2D echo shows severe aortic stenosis will notify cardiology.  Mechanical fall leading to left shoulder fracture: Orthopedic surgery evaluated the patient recommended  sling keep the elbow at 90 degrees follow-up with them as an outpatient follow-up x-ray. Physical therapy evaluated the patient, she will need skilled nursing facility. Awaiting insurance authorization. Change narcotics to oxy use lidoderm patch.  Rhabdomyolysis nontraumatic: Her on IV fluids and her CK has significantly improved. We will KVO IV fluids.   Prolonged QTC: Check a potassium greater than 4 magnesium greater  than 2.  Severe aortic stenosis: Will need to follow-up with Dr. Pernell Dupre cardiology as an outpatient. She is currently asymptomatic.  Diabetes mellitus type 1: With an A1c of 9.4.   Blood glucose continues to be elevated increase long-acting insulin.  Elevated troponins: In the setting of rhabdomyolysis and chronic kidney disease stage III Has any chest pain EKG showed no signs of ischemia.  Stage IIIb chronic kidney disease: Her creatinine appears to be at baseline.  Leukocytosis: Likely reactive due to trauma has remained afebrile now resolved.  Hyperlipidemia Continue statins.  Essential hypertension: Continue medications as needed.    DVT prophylaxis: lovenx Family Communication:daughter Status is: Inpatient  Remains inpatient appropriate because: Awaiting skilled nursing facility placement.    Code Status:     Code Status Orders  (From admission, onward)           Start     Ordered   11/19/21 1518  Full code  Continuous        11/19/21 1520           Code Status History     Date Active Date Inactive Code Status Order ID Comments User Context   10/14/2020 1755 10/16/2020 1818 Full Code [id number]  Charlynne Cousins, MD Inpatient   02/14/2019 1449 02/28/2019 1524 Full Code [id number]  Cathlyn Parsons, PA-C Inpatient   02/14/2019 1449 02/14/2019 1449 Full Code [id number]  Cathlyn Parsons, PA-C Inpatient   02/07/2019 0036 02/14/2019 1440 Full Code [id number]  Rise Patience, MD ED   08/05/2017 1448 08/08/2017 1814 Full Code [id number]  Landis Gandy  Darnell Level, MD ED   01/04/2017 1819 01/07/2017 1438 Full Code [id number]  Reyne Dumas, MD ED   07/31/2014 1654 08/05/2014 2030 Full Code [id number]  Floyce Stakes, MD Inpatient   12/29/2013 2134 01/01/2014 1811 Full Code [id number]  Etta Quill, DO ED   01/02/2013 1835 01/03/2013 2131 Full Code [id number]  Marla Roe, RN Inpatient      Advance Directive Documentation    Flowsheet Row Most Recent Value   Type of Advance Directive Living will  Pre-existing out of facility DNR order (yellow form or pink MOST form) --  "MOST" Form in Place? --         IV Access:   Peripheral IV   Procedures and diagnostic studies:   No results found.   Medical Consultants:   None.   Subjective:    Danielle Clarke relates her pain is not controlled  Objective:    Vitals:   11/23/21 1254 11/23/21 1954 11/24/21 0557 11/24/21 0602  BP: (!) 138/52 (!) 132/50 (!) 164/60   Pulse: 88 88 88 84  Resp: 18 18 17 14   Temp: 98.8 F (37.1 C) 99.3 F (37.4 C) 98.4 F (36.9 C)   TempSrc: Oral Oral Oral   SpO2: 99% 98% 97% 99%  Weight:    55.1 kg  Height:       SpO2: 99 % O2 Flow Rate (L/min): 1 L/min   Intake/Output Summary (Last 24 hours) at 11/24/2021 1019 Last data filed at 11/24/2021 0600 Gross per 24 hour  Intake --  Output 1450 ml  Net -1450 ml    Filed Weights   11/21/21 0508 11/22/21 0550 11/24/21 0602  Weight: 57.5 kg 54.4 kg 55.1 kg    Exam: General exam: In no acute distress. Respiratory system: Good air movement and clear to auscultation. Cardiovascular system: S1 & S2 heard, RRR. No JVD. Gastrointestinal system: Abdomen is nondistended, soft and nontender.  Extremities: No pedal edema. Skin: No rashes, lesions or ulcers Psychiatry: Judgement and insight appear normal. Mood & affect appropriate.    Data Reviewed:    Labs: Basic Metabolic Panel: Recent Labs  Lab 11/19/21 1115 11/19/21 1500 11/20/21 0314 11/21/21 0504  NA 137  --  138 134*  K 5.0  --  4.4 4.2  CL 100  --  104 105  CO2 23  --  26 22  GLUCOSE 375*  --  216* 204*  BUN 19  --  22 18  CREATININE 1.11*  --  1.14* 0.92  CALCIUM 9.2  --  8.4* 7.8*  MG  --  1.9  --  1.8    GFR Estimated Creatinine Clearance: 38.6 mL/min (by C-G formula based on SCr of 0.92 mg/dL). Liver Function Tests: No results for input(s): AST, ALT, ALKPHOS, BILITOT, PROT, ALBUMIN in the last 168 hours. No  results for input(s): LIPASE, AMYLASE in the last 168 hours. No results for input(s): AMMONIA in the last 168 hours. Coagulation profile No results for input(s): INR, PROTIME in the last 168 hours. COVID-19 Labs  No results for input(s): DDIMER, FERRITIN, LDH, CRP in the last 72 hours.  Lab Results  Component Value Date   SARSCOV2NAA NEGATIVE 11/19/2021   [place] NEGATIVE 10/14/2020    CBC: Recent Labs  Lab 11/19/21 1115 11/20/21 0314 11/21/21 0504  WBC 12.4* 10.1 7.9  NEUTROABS 11.1*  --   --   HGB 11.6* 9.2* 9.3*  HCT 36.6 28.4* 27.9*  MCV 89.7 88.8 88.9  PLT 202 157 145*  Cardiac Enzymes: Recent Labs  Lab 11/19/21 1115 11/20/21 0314 11/21/21 0504  CKTOTAL 949* 1,880* 867*    BNP (last 3 results) No results for input(s): PROBNP in the last 8760 hours. CBG: Recent Labs  Lab 11/23/21 1205 11/23/21 1711 11/23/21 1832 11/23/21 2154 11/24/21 0740  GLUCAP 149* 246* 252* 173* 232*    D-Dimer: No results for input(s): DDIMER in the last 72 hours. Hgb A1c: No results for input(s): HGBA1C in the last 72 hours. Lipid Profile: No results for input(s): CHOL, HDL, LDLCALC, TRIG, CHOLHDL, LDLDIRECT in the last 72 hours. Thyroid function studies: No results for input(s): TSH, T4TOTAL, T3FREE, THYROIDAB in the last 72 hours.  Invalid input(s): FREET3 Anemia work up: No results for input(s): VITAMINB12, FOLATE, FERRITIN, TIBC, IRON, RETICCTPCT in the last 72 hours. Sepsis Labs: Recent Labs  Lab 11/19/21 1115 11/20/21 0314 11/21/21 0504  WBC 12.4* 10.1 7.9    Microbiology Recent Results (from the past 240 hour(s))  Resp Panel by RT-PCR (Flu A&B, Covid) Nasopharyngeal Swab     Status: None   Collection Time: 11/19/21 11:15 AM   Specimen: Nasopharyngeal Swab; Nasopharyngeal(NP) swabs in vial transport medium  Result Value Ref Range Status   SARS Coronavirus 2 by RT PCR NEGATIVE NEGATIVE Final    Comment: (NOTE) SARS-CoV-2 target nucleic acids are NOT  DETECTED.  The SARS-CoV-2 RNA is generally detectable in upper respiratory specimens during the acute phase of infection. The lowest concentration of SARS-CoV-2 viral copies this assay can detect is 138 copies/mL. A negative result does not preclude SARS-Cov-2 infection and should not be used as the sole basis for treatment or other patient management decisions. A negative result may occur with  improper specimen collection/handling, submission of specimen other than nasopharyngeal swab, presence of viral mutation(s) within the areas targeted by this assay, and inadequate number of viral copies(<138 copies/mL). A negative result must be combined with clinical observations, patient history, and epidemiological information. The expected result is Negative.  Fact Sheet for Patients:  BloggerCourse.com  Fact Sheet for Healthcare Providers:  SeriousBroker.it  This test is no t yet approved or cleared by the [place] FDA and  has been authorized for detection and/or diagnosis of SARS-CoV-2 by FDA under an Emergency Use Authorization (EUA). This EUA will remain  in effect (meaning this test can be used) for the duration of the COVID-19 declaration under Section 564(b)(1) of the Act, 21 U.S.C.section 360bbb-3(b)(1), unless the authorization is terminated  or revoked sooner.       Influenza A by PCR NEGATIVE NEGATIVE Final   Influenza B by PCR NEGATIVE NEGATIVE Final    Comment: (NOTE) The Xpert Xpress SARS-CoV-2/FLU/RSV plus assay is intended as an aid in the diagnosis of influenza from Nasopharyngeal swab specimens and should not be used as a sole basis for treatment. Nasal washings and aspirates are unacceptable for Xpert Xpress SARS-CoV-2/FLU/RSV testing.  Fact Sheet for Patients: BloggerCourse.com  Fact Sheet for Healthcare Providers: SeriousBroker.it  This test is not yet  approved or cleared by the [place] FDA and has been authorized for detection and/or diagnosis of SARS-CoV-2 by FDA under an Emergency Use Authorization (EUA). This EUA will remain in effect (meaning this test can be used) for the duration of the COVID-19 declaration under Section 564(b)(1) of the Act, 21 U.S.C. section 360bbb-3(b)(1), unless the authorization is terminated or revoked.  Performed at [facility], [address] [place], [place] [address]      Medications:    enoxaparin (  LOVENOX) injection  30 mg Subcutaneous Q24H   insulin aspart  0-5 Units Subcutaneous QHS   insulin aspart  0-6 Units Subcutaneous TID WC   insulin aspart  3 Units Subcutaneous TID WC   insulin glargine-yfgn  12 Units Subcutaneous q morning   loratadine  10 mg Oral Daily   polyethylene glycol  17 g Oral BID   sodium chloride flush  3 mL Intravenous Q12H   Continuous Infusions:    LOS: 3 days   Charlynne Cousins  [facility]  11/24/2021, 10:19 AM

## 2021-11-24 NOTE — Progress Notes (Signed)
PT Cancellation Note  Patient Details Name: Danielle Clarke MRN: [id number] DOB: [birth year]   Cancelled Treatment:    Reason Eval/Treat Not Completed: Other (comment) (pt just received lunch tray and politely declined. Pt states awaiting D/C to SNF tomorrow)   [name] [name] 11/24/2021, 12:49 PM [name], PT Acute Rehabilitation Services Pager: [phone] Office: [phone]

## 2021-11-24 NOTE — TOC Progression Note (Signed)
Transition of Care [facility]) - Progression Note    Patient Details  Name: Danielle Clarke MRN: [id number] Date of Birth: November 24, 1941  Transition of Care [facility]) CM/SW Contact  Jimmy Picket, [place] Phone Number: 11/24/2021, 11:35 AM  Clinical Narrative:     Pts insurance Berkley Harvey has been approved, Berkley Harvey ID is [id number] and it is good until 11/26/2021.   Pt not ready for DC today. CSW requested covid test from MD.   Expected Discharge Plan: Skilled Nursing Facility Barriers to Discharge: Continued Medical Work up  Expected Discharge Plan and Services Expected Discharge Plan: Skilled Nursing Facility     Post Acute Care Choice: Home Health Living arrangements for the past 2 months: Single Family Home Expected Discharge Date: 11/22/21                                     Social Determinants of Health (SDOH) Interventions    Readmission Risk Interventions Readmission Risk Prevention Plan 10/15/2020  Post Dischage Appt Complete  Medication Screening Complete  Transportation Screening Complete  Some recent data might be hidden   Jimmy Picket, LCSW Clinical Social Worker

## 2021-11-24 NOTE — Progress Notes (Signed)
Physical Therapy Treatment Patient Details Name: Danielle Clarke MRN: [id number] DOB: 1941-10-20 Today's Date: 11/24/2021   History of Present Illness 80 y.o. female admitted 11/19/21 after syncope and fall unable to get up approximately 6 hrs with displaced proximal L humeral neck fx. Head CT negative for acute intracranial changes,rhabdomyolysis. PMHx: bradycardia/sinus arrest s/p PPM, syncope, moderate AS, HTN, DM, PVD, HLD, CKD III.    PT Comments    Pt making slow, steady progress. Continue to recommend SNF at DC despite AM-PAC score of 16 due to does not have family at home to provide support.    Recommendations for follow up therapy are one component of a multi-disciplinary discharge planning process, led by the attending physician.  Recommendations may be updated based on patient status, additional functional criteria and insurance authorization.  Follow Up Recommendations  Skilled nursing-short term rehab (<3 hours/day)     Assistance Recommended at Discharge Frequent or constant Supervision/Assistance  Equipment Recommendations  None recommended by PT    Recommendations for Other Services       Precautions / Restrictions Precautions Precautions: Fall Precaution Comments: elbow to hand ROM only Required Braces or Orthoses: Sling Restrictions LUE Weight Bearing: Non weight bearing Other Position/Activity Restrictions: sling for support and protection     Mobility  Bed Mobility Overal bed mobility: Needs Assistance Bed Mobility: Sit to Supine       Sit to supine: Min assist   General bed mobility comments: Assist to bring legs up into bed    Transfers Overall transfer level: Needs assistance Equipment used: 1 person hand held assist Transfers: Sit to/from Stand Sit to Stand: Min assist           General transfer comment: Assist to bring hips up and for balance    Ambulation/Gait Ambulation/Gait assistance: Min assist Gait Distance (Feet): 20  Feet (20' x 1, 12' x 1) Assistive device: 1 person hand held assist Gait Pattern/deviations: Step-through pattern;Decreased step length - right;Decreased step length - left;Shuffle;Narrow base of support Gait velocity: decr Gait velocity interpretation: <1.31 ft/sec, indicative of household ambulator   General Gait Details: Assist for balance and support   Stairs             Wheelchair Mobility    Modified Rankin (Stroke Patients Only)       Balance Overall balance assessment: Needs assistance;History of Falls Sitting-balance support: Feet supported Sitting balance-Leahy Scale: Good     Standing balance support: Single extremity supported Standing balance-Leahy Scale: Poor Standing balance comment: Reliant on RUE support for balance                            Cognition Arousal/Alertness: Awake/alert Behavior During Therapy: WFL for tasks assessed/performed Overall Cognitive Status: Impaired/Different from baseline Area of Impairment: Problem solving;Safety/judgement                         Safety/Judgement: Decreased awareness of deficits;Decreased awareness of safety   Problem Solving: Slow processing;Requires verbal cues          Exercises      General Comments        Pertinent Vitals/Pain Pain Assessment: Faces Faces Pain Scale: Hurts little more Pain Location: L shoulder Pain Descriptors / Indicators: Grimacing;Guarding Pain Intervention(s): Limited activity within patient's tolerance;Repositioned    Home Living  Prior Function            PT Goals (current goals can now be found in the care plan section) Acute Rehab PT Goals Patient Stated Goal: to get better    Frequency    Min 3X/week      PT Plan Current plan remains appropriate;Frequency needs to be updated    Co-evaluation              AM-PAC PT "6 Clicks" Mobility   Outcome Measure  Help needed turning from  your back to your side while in a flat bed without using bedrails?: A Little Help needed moving from lying on your back to sitting on the side of a flat bed without using bedrails?: A Little Help needed moving to and from a bed to a chair (including a wheelchair)?: A Little Help needed standing up from a chair using your arms (e.g., wheelchair or bedside chair)?: A Little Help needed to walk in hospital room?: A Little Help needed climbing 3-5 steps with a railing? : Total 6 Click Score: 16    End of Session Equipment Utilized During Treatment: Other (comment) (sling) Activity Tolerance: Patient tolerated treatment well Patient left: in bed;with bed alarm set;with call bell/phone within reach Nurse Communication: Mobility status PT Visit Diagnosis: Other abnormalities of gait and mobility (R26.89);Muscle weakness (generalized) (M62.81);Pain;Unsteadiness on feet (R26.81);Difficulty in walking, not elsewhere classified (R26.2) Pain - Right/Left: Left Pain - part of body: Shoulder;Arm     Time: 1547-1600 PT Time Calculation (min) (ACUTE ONLY): 13 min  Charges:  $Gait Training: 8-22 mins                     [facility] PT Acute Rehabilitation Services Pager [phone] Office [phone]    Angelina Ok [facility] 11/24/2021, 5:12 PM

## 2021-11-24 NOTE — Progress Notes (Signed)
Occupational Therapy Treatment Patient Details Name: Danielle Clarke MRN: [id number] DOB: 05-12-1941 Today's Date: 11/24/2021   History of present illness 80 y.o. female admitted 11/19/21 after syncope and fall unable to get up approximately 6 hrs with displaced proximal L humeral neck fx. Head CT negative for acute intracranial changes,rhabdomyolysis. PMHx: bradycardia/sinus arrest s/p PPM, syncope, moderate AS, HTN, DM, PVD, HLD, CKD III.   OT comments  Pt readily willing to get OOB. Ambulated in room with hand held assist. Completed AROM R elbow to hand. Educated in compensatory strategies for UE dressing and in sling use. Pt left up in chair with daughter. Asked that nursing avoid purewick during the day so pt may mobilize more. VSS throughout session.    Recommendations for follow up therapy are one component of a multi-disciplinary discharge planning process, led by the attending physician.  Recommendations may be updated based on patient status, additional functional criteria and insurance authorization.    Follow Up Recommendations  Skilled nursing-short term rehab (<3 hours/day)    Assistance Recommended at Discharge Frequent or constant Supervision/Assistance  Equipment Recommendations  BSC/3in1    Recommendations for Other Services      Precautions / Restrictions Precautions Precautions: Fall Precaution Comments: elbow to hand ROM only Required Braces or Orthoses: Sling Restrictions Weight Bearing Restrictions: Yes LUE Weight Bearing: Non weight bearing       Mobility Bed Mobility Overal bed mobility: Needs Assistance Bed Mobility: Supine to Sit     Supine to sit: Supervision     General bed mobility comments: use of rail, HOB up, from R side    Transfers Overall transfer level: Needs assistance Equipment used: 1 person hand held assist Transfers: Sit to/from Stand Sit to Stand: Min assist           General transfer comment: ambulated with hand  held assist to bathroom and then to chair     Balance Overall balance assessment: Needs assistance;History of Falls Sitting-balance support: Feet supported Sitting balance-Leahy Scale: Good     Standing balance support: Single extremity supported Standing balance-Leahy Scale: Poor Standing balance comment: Reliant on RUE support for balance                           ADL either performed or assessed with clinical judgement   ADL Overall ADL's : Needs assistance/impaired     Grooming: Wash/dry hands;Standing;Minimal assistance                   Toilet Transfer: Minimal assistance;Ambulation;BSC/3in1   Toileting- Clothing Manipulation and Hygiene: Minimal assistance;Sit to/from stand       Functional mobility during ADLs: Minimal assistance      Extremity/Trunk Assessment              Vision       Perception     Praxis      Cognition Arousal/Alertness: Awake/alert Behavior During Therapy: WFL for tasks assessed/performed Overall Cognitive Status: Impaired/Different from baseline Area of Impairment: Problem solving;Safety/judgement                     Memory: Decreased short-term memory Following Commands: Follows one step commands with increased time     Problem Solving: Slow processing;Difficulty sequencing;Requires verbal cues            Exercises Exercises: Other exercises Other Exercises Other Exercises: AROM L elbow to hand x 5 each   Shoulder Instructions  General Comments      Pertinent Vitals/ Pain       Pain Assessment: Faces Faces Pain Scale: Hurts even more Pain Location: L shoulder Pain Descriptors / Indicators: Grimacing;Guarding Pain Intervention(s): Repositioned;Ice applied  Home Living                                          Prior Functioning/Environment              Frequency  Min 2X/week        Progress Toward Goals  OT Goals(current goals can now be found  in the care plan section)  Progress towards OT goals: Progressing toward goals  Acute Rehab OT Goals OT Goal Formulation: With patient Time For Goal Achievement: 12/05/21 Potential to Achieve Goals: Good  Plan Discharge plan remains appropriate    Co-evaluation                 AM-PAC OT "6 Clicks" Daily Activity     Outcome Measure   Help from another person eating meals?: A Little Help from another person taking care of personal grooming?: A Little Help from another person toileting, which includes using toliet, bedpan, or urinal?: A Little Help from another person bathing (including washing, rinsing, drying)?: A Lot Help from another person to put on and taking off regular upper body clothing?: A Lot Help from another person to put on and taking off regular lower body clothing?: A Lot 6 Click Score: 15    End of Session Equipment Utilized During Treatment: Gait belt  OT Visit Diagnosis: Other abnormalities of gait and mobility (R26.89);History of falling (Z91.81);Muscle weakness (generalized) (M62.81)   Activity Tolerance Patient tolerated treatment well;Patient limited by pain   Patient Left in chair;with call bell/phone within reach;with chair alarm set;with family/visitor present   Nurse Communication Mobility status;Other (comment) (pt to walk to bathroom, avoid purewick during the day)        Time: 1110-1140 OT Time Calculation (min): 30 min  Charges: OT General Charges $OT Visit: 1 Visit OT Treatments $Self Care/Home Management : 23-37 mins  Martie Round, OTR/L Acute Rehabilitation Services Pager: [phone] Office: [phone]   Evern Bio 11/24/2021, 12:59 PM

## 2021-11-25 DIAGNOSIS — M6281 Muscle weakness (generalized): Secondary | ICD-10-CM | POA: Diagnosis not present

## 2021-11-25 DIAGNOSIS — E1022 Type 1 diabetes mellitus with diabetic chronic kidney disease: Secondary | ICD-10-CM | POA: Diagnosis not present

## 2021-11-25 DIAGNOSIS — R778 Other specified abnormalities of plasma proteins: Secondary | ICD-10-CM | POA: Diagnosis not present

## 2021-11-25 DIAGNOSIS — I129 Hypertensive chronic kidney disease with stage 1 through stage 4 chronic kidney disease, or unspecified chronic kidney disease: Secondary | ICD-10-CM | POA: Diagnosis not present

## 2021-11-25 DIAGNOSIS — R748 Abnormal levels of other serum enzymes: Secondary | ICD-10-CM | POA: Diagnosis not present

## 2021-11-25 DIAGNOSIS — S42352A Displaced comminuted fracture of shaft of humerus, left arm, initial encounter for closed fracture: Secondary | ICD-10-CM | POA: Diagnosis not present

## 2021-11-25 DIAGNOSIS — Z9181 History of falling: Secondary | ICD-10-CM | POA: Diagnosis not present

## 2021-11-25 DIAGNOSIS — N182 Chronic kidney disease, stage 2 (mild): Secondary | ICD-10-CM | POA: Diagnosis not present

## 2021-11-25 DIAGNOSIS — R41841 Cognitive communication deficit: Secondary | ICD-10-CM | POA: Diagnosis not present

## 2021-11-25 DIAGNOSIS — R2689 Other abnormalities of gait and mobility: Secondary | ICD-10-CM | POA: Diagnosis not present

## 2021-11-25 DIAGNOSIS — R55 Syncope and collapse: Secondary | ICD-10-CM | POA: Diagnosis not present

## 2021-11-25 DIAGNOSIS — I69828 Other speech and language deficits following other cerebrovascular disease: Secondary | ICD-10-CM | POA: Diagnosis not present

## 2021-11-25 DIAGNOSIS — D638 Anemia in other chronic diseases classified elsewhere: Secondary | ICD-10-CM | POA: Diagnosis not present

## 2021-11-25 DIAGNOSIS — S42352D Displaced comminuted fracture of shaft of humerus, left arm, subsequent encounter for fracture with routine healing: Secondary | ICD-10-CM | POA: Diagnosis not present

## 2021-11-25 DIAGNOSIS — I350 Nonrheumatic aortic (valve) stenosis: Secondary | ICD-10-CM | POA: Diagnosis not present

## 2021-11-25 DIAGNOSIS — N1832 Chronic kidney disease, stage 3b: Secondary | ICD-10-CM | POA: Diagnosis not present

## 2021-11-25 LAB — GLUCOSE, CAPILLARY
Glucose-Capillary: 167 mg/dL — ABNORMAL HIGH (ref 70–99)
Glucose-Capillary: 206 mg/dL — ABNORMAL HIGH (ref 70–99)

## 2021-11-25 MED ORDER — OXYCODONE-ACETAMINOPHEN 5-325 MG PO TABS: 1.0000 | ORAL_TABLET | ORAL | 0 refills | Status: DC | PRN

## 2021-11-25 MED ORDER — LIDOCAINE 5 % EX PTCH: 1.0000 | MEDICATED_PATCH | CUTANEOUS | 0 refills | Status: DC

## 2021-11-25 MED ORDER — LIDOCAINE 5 % EX PTCH
1.0000 | MEDICATED_PATCH | CUTANEOUS | Status: DC
  Administered 2021-11-25: 11:00:00 1 via TRANSDERMAL
  Filled 2021-11-25: qty 1

## 2021-11-25 NOTE — Discharge Summary (Addendum)
Physician Discharge Summary  [name] [id number] DOB: 11-01-1941 DOA: 11/19/2021  PCP: Hulan Fess, MD  Admit date: 11/19/2021 Discharge date: 11/25/2021  Admitted From: Home Disposition:  SNF  Recommendations for Outpatient Follow-up:  Follow up with PCP in 1-2 weeks Please obtain BMP/CBC in one week She will go to skilled nursing facility   Home Health:No Equipment/Devices:None Discharge Condition:Stable CODE STATUS:Full Diet recommendation: Heart Healthy   Brief/Interim Summary: 80 y.o. female past medical history sinus arrest, complete heart block status post pacemaker, moderate aortic stenosis, essential hypertension, diabetes mellitus type 1, chronic kidney disease stage IIIb comes into the ED after a mechanical fall at home she tried to get up but she was too weak to do so she is pending the floor about 6.5 hours she was brought into the ED she was found to be hypertensive, rhabdomyolysis, blood glucose of 400, white blood cell count of 12, her pacer was interrogated there were no events.  CT of the head showed no acute intracranial abnormalities.  X-ray of the left upper extremity showed displaced humeral fracture for which orthopedics was consulted she was placed in a sling, she was started on IV fluids her CK continues to rise despite IV fluid hydration, 2D echo showed a EF of 60% and grade 1 diastolic heart failure, with severe aortic stenosis with a mean gradient of 39 mmHg with moderate calcified aortic valve  Discharge Diagnoses:  Principal Problem:   Syncope Active Problems:   Diabetes mellitus type 1 ([place])   Essential hypertension, benign   Hyperlipidemia   Aortic stenosis   Elevated troponin   Anemia of chronic disease   Leukocytosis   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   CKD (chronic kidney disease), stage III (H[facility])   Closed comminuted left humeral fracture   Prolonged QT interval   Elevated CK  Mechanical fall leading to left  shoulder fracture: Orthopedic evaluated the patient recommended a sling keeping at 90 degrees. Physical therapy evaluated the patient recommended home health PT.  Syncope and collapse: Interrogated pacemaker no acute events she stated that her acute pain catheter passed out after she was on the floor, 2D echo showed aortic stenosis she will follow-up with cardiology as an outpatient to reevaluate.  Rhabdomyolysis nontraumatic: Likely to be immobile in the floor for more than 6 hours. She was started on IV fluids as resolved and did not affect her renal function.  Prolonged QTC: Her potassium was kept greater than 4 magnesium greater than 2 resolved.  Severe aortic stenosis: Will need to follow-up with cardiology as an outpatient.  Diabetes mellitus type 1: With an A1c of 9.4 no changes made to her medication she will continue her long-acting insulin plus sliding scale.  Elevated troponins: In the setting of rhabdomyolysis necrotic kidney disease stage IIIa, she denies any chest pain or shortness of breath EKG showed no signs of ischemia.  Chronic kidney disease stage IIIb: Her creatinine remained at baseline.  Leukocytosis: Likely reactive due to trauma now resolved she remained afebrile.  Hyperlipidemia: Continue statins.  Essential hypertension: No change made to her medication.  Chronic diastolic heart failure: 2D echo done that showed an EF of 60 With grade 1 diastolic heart failure  Discharge Instructions  Discharge Instructions     Diet - low sodium heart healthy   Complete by: As directed    Diet - low sodium heart healthy   Complete by: As directed    Diet Carb Modified   Complete  by: As directed    Discharge instructions   Complete by: As directed    You were admitted after a fall at home with resultant left shoulder/humerus fracture. Nonoperative management is recommended with plans to continue the sling immobilization and follow up with orthopedics in 2  weeks for follow up xray. You will also need to follow up with cardiology and your PCP after discharge as well. Rehabilitation facility placement was recommended, though you have opted to return home with additional assistance and supervision. Home health has been arranged for you as well. If you have episodes of passing out, chest pain, shortness of breath, or uncontrolled pain or an additional fall, you will need to seek medical attention right away.   Increase activity slowly   Complete by: As directed    Increase activity slowly   Complete by: As directed       Allergies as of 11/25/2021       Reactions   Cefaclor Anaphylaxis   Tolerated cephalexin in 2018   Tetracycline Anaphylaxis   Vancomycin Anaphylaxis   Augmentin [amoxicillin-pot Clavulanate] Other (See Comments)   Reaction unknown >> Anaphylaxis Has patient had a PCN reaction causing immediate rash, facial/tongue/throat swelling, SOB or lightheadedness with hypotension: Unknown Has patient had a PCN reaction causing severe rash involving mucus membranes or skin necrosis: Unknown Has patient had a PCN reaction that required hospitalization: Unknown Has patient had a PCN reaction occurring within the last 10 years: Unknown If all of the above answers are "NO", then may proceed with Cephalosporin use.   Nsaids    Other reaction(s): CKD   Quinapril Hcl    Other reaction(s): elevated potassium   Sulfamethoxazole-trimethoprim    Other reaction(s): decreased kidney function   Prednisone Other (See Comments)   Reaction unknown        Medication List     TAKE these medications    HumaLOG KwikPen 100 UNIT/ML KwikPen Generic drug: insulin lispro Inject 0-6 Units into the skin 3 (three) times daily as needed (high blood sugar). BS  80-150=2 units 151-200=3 units 201-250=4 units 251-300=5 units 301-350=6 units 351-400=7 units 401-450=8 units   ibuprofen 200 MG tablet Commonly known as: ADVIL Take 200 mg by mouth 2  (two) times daily.   insulin glargine 100 UNIT/ML Solostar Pen Commonly known as: LANTUS Inject 13 Units into the skin daily. What changed: how much to take   lidocaine 5 % Commonly known as: LIDODERM Place 1 patch onto the skin daily. Remove & Discard patch within 12 hours or as directed by MD   loratadine 10 MG tablet Commonly known as: CLARITIN Take 1 tablet (10 mg total) by mouth daily.   multivitamin with minerals Tabs tablet Take 1 tablet by mouth daily.   oxyCODONE-acetaminophen 5-325 MG tablet Commonly known as: PERCOCET/ROXICET Take 1-2 tablets by mouth every 4 (four) hours as needed for severe pain.   polyvinyl alcohol 1.4 % ophthalmic solution Commonly known as: LIQUIFILM TEARS Place 1 drop into both eyes as needed for dry eyes.   SALONPAS EX Apply 1 patch topically daily as needed (pain).   simvastatin 40 MG tablet Commonly known as: ZOCOR Take 40 mg by mouth daily.   VITAMIN D PO Take 1 tablet by mouth daily.        Follow-up Information     Jacelyn Pi, MD Follow up.   Specialty: Endocrinology Contact information: [address] [place] [address] [phone]         [place],  Lennette Bihari, MD Follow up.   Specialty: Family Medicine Contact information: [place] [address] [phone]                Allergies  Allergen Reactions   Cefaclor Anaphylaxis    Tolerated cephalexin in 2018   Tetracycline Anaphylaxis   Vancomycin Anaphylaxis   Augmentin [Amoxicillin-Pot Clavulanate] Other (See Comments)    Reaction unknown >> Anaphylaxis Has patient had a PCN reaction causing immediate rash, facial/tongue/throat swelling, SOB or lightheadedness with hypotension: Unknown Has patient had a PCN reaction causing severe rash involving mucus membranes or skin necrosis: Unknown Has patient had a PCN reaction that required hospitalization: Unknown Has patient had a PCN reaction occurring within the last  10 years: Unknown If all of the above answers are "NO", then may proceed with Cephalosporin use.    Nsaids     Other reaction(s): CKD   Quinapril Hcl     Other reaction(s): elevated potassium   Sulfamethoxazole-Trimethoprim     Other reaction(s): decreased kidney function   Prednisone Other (See Comments)    Reaction unknown    Consultations: None   Procedures/Studies: DG Chest 1 View  Result Date: 11/19/2021 CLINICAL DATA:  Fall on left shoulder. EXAM: CHEST  1 VIEW COMPARISON:  10/14/2020 FINDINGS: Prior right shoulder replacement. Left humeral neck fracture seen as seen on shoulder series. Left pacer in place with leads in the right atrium and right ventricle, unchanged. Heart is normal size. No confluent opacities, effusions or pneumothorax. IMPRESSION: No acute cardiopulmonary disease. Left humeral neck fracture. Electronically Signed   By: Rolm Baptise M.D.   On: 11/19/2021 12:06   CT HEAD WO CONTRAST (5MM)  Result Date: 11/19/2021 CLINICAL DATA:  [place] last night at 0030 hours, passed out twice after falling due to pain in LEFT shoulder EXAM: CT HEAD WITHOUT CONTRAST TECHNIQUE: Contiguous axial images were obtained from the base of the skull through the vertex without intravenous contrast. COMPARISON:  10/14/2020 FINDINGS: Brain: Generalized atrophy. Mild ex vacuo dilatation of the frontal horn LEFT lateral ventricle. Otherwise normal ventricular morphology. No midline shift or mass effect. Encephalomalacia LEFT frontal lobe from old infarct. Additional small old infarct at anterior aspect of LEFT temporal lobe. Remaining brain parenchyma unremarkable. No intracranial hemorrhage, mass lesion, or evidence of acute infarction. Vascular: No extra-axial fluid collections. Skull: Intact Sinuses/Orbits: Chronic opacification of RIGHT frontal sinus. Air-fluid level LEFT sphenoid sinus. Remaining sinuses clear. Concha bullosa of the middle turbinates. 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 No acute intracranial abnormalities. 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 Electronically Signed   By: Lavonia Dana M.D.   On: 11/19/2021 12:58   DG Shoulder Left  Result Date: 11/19/2021 CLINICAL DATA:  Fall onto shoulder, pain EXAM: LEFT SHOULDER - 2+ VIEW COMPARISON:  None. FINDINGS: There is a fracture through the left humeral neck. Mild lateral displacement of the humeral head. Fracture likely extends through the greater tuberosity. No subluxation or dislocation. IMPRESSION: Mildly displaced and comminuted proximal left humeral/humeral neck fracture. Electronically Signed   By: Rolm Baptise M.D.   On: 11/19/2021 12:05   DG Humerus Left  Result Date: 11/19/2021 CLINICAL DATA:  Fall EXAM: LEFT HUMERUS - 2+ VIEW COMPARISON:  None. FINDINGS: There is a comminuted fracture through the left humeral neck extending through the greater tuberosity. Mildly displaced humeral head laterally. No subluxation or dislocation. IMPRESSION: Mildly comminuted, displaced proximal humeral/humeral neck fracture. Electronically Signed   By: Lennette Bihari  [place] M.D.   On: 11/19/2021 12:06   ECHOCARDIOGRAM COMPLETE  Result Date: 11/19/2021    ECHOCARDIOGRAM REPORT   Patient Name:   Danielle Clarke Date of Exam: 11/19/2021 Medical Rec #:  [id number]          Height:       60.0 in Accession #:    [id number]         Weight:       118.6 lb Date of Birth:  12-08-1940          BSA:          1.495 m Patient Age:    54 years           BP:           160/55 mmHg Patient Gender: F                  HR:           94 bpm. Exam Location:  Inpatient Procedure: 2D Echo, Cardiac Doppler and Color Doppler Indications:    Syncope  History:        Patient has prior history of Echocardiogram examinations, most                 recent 04/27/2021. Defibrillator, Aortic Valve Disease and Mitral                 Valve Disease; Risk Factors:Hypertension and Diabetes. PVD. CKD.  Sonographer:    Clayton Lefort RDCS (AE)  Referring Phys: [id number] [facility]  Sonographer Comments: Suboptimal subcostal window. Patients left arm immobilized in sling. IMPRESSIONS  1. Left ventricular ejection fraction, by estimation, is 65 to 70%. The left ventricle has normal function. Left ventricular endocardial border not optimally defined to evaluate regional wall motion. There is mild concentric left ventricular hypertrophy. Left ventricular diastolic parameters are consistent with Grade I diastolic dysfunction (impaired relaxation). Elevated left ventricular end-diastolic pressure.  2. RV-RA gradient 35 mmHg suggests at least mildly increased PASP. Right ventricular systolic function is normal. The right ventricular size is normal.  3. The mitral valve is abnormal. Mild to moderate mitral valve regurgitation. Mild to moderate mitral stenosis. The mean mitral valve gradient is 7.0 mmHg. Moderate mitral annular calcification.  4. The aortic valve has an indeterminant number of cusps. There is moderate calcification of the aortic valve. Aortic valve regurgitation is not visualized. Severe aortic valve stenosis. Aortic valve mean gradient measures 38.8 mmHg.  5. Unable to estimate CVP. Comparison(s): Prior images reviewed side by side. LVEF vigorous. Aortic stenosis more clearly in severe range, although valve not well visualized. Mild to moderate mitral stenosis and regurgitation as well. FINDINGS  Left Ventricle: Left ventricular ejection fraction, by estimation, is 65 to 70%. The left ventricle has normal function. Left ventricular endocardial border not optimally defined to evaluate regional wall motion. The left ventricular internal cavity size was normal in size. There is mild concentric left ventricular hypertrophy. Left ventricular diastolic parameters are consistent with Grade I diastolic dysfunction (impaired relaxation). Elevated left ventricular end-diastolic pressure. Right Ventricle: RV-RA gradient 35 mmHg suggests at least mildly  increased PASP. The right ventricular size is normal. No increase in right ventricular wall thickness. Right ventricular systolic function is normal. Left Atrium: Left atrial size was normal in size. Right Atrium: Right atrial size was normal in size. Pericardium: There is no evidence of pericardial effusion. Mitral Valve: The mitral valve is abnormal. There is mild thickening of the mitral valve leaflet(s).  There is moderate calcification of the mitral valve leaflet(s). Moderate mitral annular calcification. Mild to moderate mitral valve regurgitation. Mild to moderate mitral valve stenosis. MV peak gradient, 16.8 mmHg. The mean mitral valve gradient is 7.0 mmHg. Tricuspid Valve: The tricuspid valve is grossly normal. Tricuspid valve regurgitation is mild. Aortic Valve: The aortic valve has an indeterminant number of cusps. There is moderate calcification of the aortic valve. Aortic valve regurgitation is not visualized. Severe aortic stenosis is present. Aortic valve mean gradient measures 38.8 mmHg. Aortic valve peak gradient measures 62.4 mmHg. Aortic valve area, by VTI measures 0.71 cm. Pulmonic Valve: The pulmonic valve was not well visualized. Pulmonic valve regurgitation is not visualized. Aorta: The aortic root is normal in size and structure. Venous: Unable to estimate CVP. The inferior vena cava was not well visualized. IAS/Shunts: No atrial level shunt detected by color flow Doppler. Additional Comments: A device lead is visualized.  LEFT VENTRICLE PLAX 2D LVIDd:         2.90 cm   Diastology LVIDs:         2.20 cm   LV e' medial:    5.22 cm/s LV PW:         1.60 cm   LV E/e' medial:  19.3 LV IVS:        1.30 cm   LV e' lateral:   6.96 cm/s LVOT diam:     1.90 cm   LV E/e' lateral: 14.5 LV SV:         59 LV SV Index:   39 LVOT Area:     2.84 cm  RIGHT VENTRICLE RV Basal diam:  3.00 cm RV S prime:     14.60 cm/s TAPSE (M-mode): 1.7 cm LEFT ATRIUM             Index        RIGHT ATRIUM           Index LA  diam:        3.10 cm 2.07 cm/m   RA Area:     10.20 cm LA Vol (A2C):   28.2 ml 18.86 ml/m  RA Volume:   20.30 ml  13.58 ml/m LA Vol (A4C):   33.3 ml 22.28 ml/m LA Biplane Vol: 31.8 ml 21.27 ml/m  AORTIC VALVE AV Area (Vmax):    0.83 cm AV Area (Vmean):   0.76 cm AV Area (VTI):     0.71 cm AV Vmax:           395.00 cm/s AV Vmean:          294.400 cm/s AV VTI:            0.828 m AV Peak Grad:      62.4 mmHg AV Mean Grad:      38.8 mmHg LVOT Vmax:         115.00 cm/s LVOT Vmean:        78.700 cm/s LVOT VTI:          0.208 m LVOT/AV VTI ratio: 0.25  AORTA Ao Root diam: 3.10 cm MITRAL VALVE                TRICUSPID VALVE MV Area (PHT): 2.22 cm     TR Peak grad:   35.0 mmHg MV Area VTI:   1.58 cm     TR Vmax:        296.00 cm/s MV Peak grad:  16.8 mmHg MV Mean grad:  7.0 mmHg  SHUNTS MV Vmax:       2.05 m/s     Systemic VTI:  0.21 m MV Vmean:      120.0 cm/s   Systemic Diam: 1.90 cm MV Decel Time: 342 msec MV E velocity: 101.00 cm/s MV A velocity: 183.00 cm/s MV E/A ratio:  0.55 Rozann Lesches MD Electronically signed by Rozann Lesches MD Signature Date/Time: 11/19/2021/4:39:51 PM    Final    CUP PACEART REMOTE DEVICE CHECK  Result Date: 11/16/2021 Scheduled remote reviewed. Normal device function.  Next remote 91 days. LR  (Echo, Carotid, EGD, Colonoscopy, ERCP)    Subjective: No complaints  Discharge Exam: Vitals:   11/24/21 1953 11/25/21 0520  BP: (!) 116/54 (!) 141/61  Pulse:  84  Resp: 16 15  Temp: 98.5 F (36.9 C) 98.5 F (36.9 C)  SpO2: 99% 99%   Vitals:   11/24/21 0557 11/24/21 0602 11/24/21 1953 11/25/21 0520  BP: (!) 164/60  (!) 116/54 (!) 141/61  Pulse: 88 84  84  Resp: 17 14 16 15   Temp: 98.4 F (36.9 C)  98.5 F (36.9 C) 98.5 F (36.9 C)  TempSrc: Oral  Oral Oral  SpO2: 97% 99% 99% 99%  Weight:  55.1 kg  54.4 kg  Height:        General: Pt is alert, awake, not in acute distress Cardiovascular: RRR, S1/S2 +, no rubs, no gallops Respiratory: CTA  bilaterally, no wheezing, no rhonchi Abdominal: Soft, NT, ND, bowel sounds + Extremities: no edema, no cyanosis    The results of significant diagnostics from this hospitalization (including imaging, microbiology, ancillary and laboratory) are listed below for reference.     Microbiology: Recent Results (from the past 240 hour(s))  Resp Panel by RT-PCR (Flu A&B, Covid) Nasopharyngeal Swab     Status: None   Collection Time: 11/19/21 11:15 AM   Specimen: Nasopharyngeal Swab; Nasopharyngeal(NP) swabs in vial transport medium  Result Value Ref Range Status   SARS Coronavirus 2 by RT PCR NEGATIVE NEGATIVE Final    Comment: (NOTE) SARS-CoV-2 target nucleic acids are NOT DETECTED.  The SARS-CoV-2 RNA is generally detectable in upper respiratory specimens during the acute phase of infection. The lowest concentration of SARS-CoV-2 viral copies this assay can detect is 138 copies/mL. A negative result does not preclude SARS-Cov-2 infection and should not be used as the sole basis for treatment or other patient management decisions. A negative result may occur with  improper specimen collection/handling, submission of specimen other than nasopharyngeal swab, presence of viral mutation(s) within the areas targeted by this assay, and inadequate number of viral copies(<138 copies/mL). A negative result must be combined with clinical observations, patient history, and epidemiological information. The expected result is Negative.  Fact Sheet for Patients:  EntrepreneurPulse.com.au  Fact Sheet for Healthcare Providers:  IncredibleEmployment.be  This test is no t yet approved or cleared by the [place] FDA and  has been authorized for detection and/or diagnosis of SARS-CoV-2 by FDA under an Emergency Use Authorization (EUA). This EUA will remain  in effect (meaning this test can be used) for the duration of the COVID-19 declaration under Section  564(b)(1) of the Act, 21 U.S.C.section 360bbb-3(b)(1), unless the authorization is terminated  or revoked sooner.       Influenza A by PCR NEGATIVE NEGATIVE Final   Influenza B by PCR NEGATIVE NEGATIVE Final    Comment: (NOTE) The Xpert Xpress SARS-CoV-2/FLU/RSV plus assay is intended as an aid in the diagnosis of influenza from Nasopharyngeal  swab specimens and should not be used as a sole basis for treatment. Nasal washings and aspirates are unacceptable for Xpert Xpress SARS-CoV-2/FLU/RSV testing.  Fact Sheet for Patients: EntrepreneurPulse.com.au  Fact Sheet for Healthcare Providers: IncredibleEmployment.be  This test is not yet approved or cleared by the [place] FDA and has been authorized for detection and/or diagnosis of SARS-CoV-2 by FDA under an Emergency Use Authorization (EUA). This EUA will remain in effect (meaning this test can be used) for the duration of the COVID-19 declaration under Section 564(b)(1) of the Act, 21 U.S.C. section 360bbb-3(b)(1), unless the authorization is terminated or revoked.  Performed at [facility], [place] [address], [place], [place] [address]   Resp Panel by RT-PCR (Flu A&B, Covid) Nasopharyngeal Swab     Status: None   Collection Time: 11/24/21 11:36 AM   Specimen: Nasopharyngeal Swab; Nasopharyngeal(NP) swabs in vial transport medium  Result Value Ref Range Status   SARS Coronavirus 2 by RT PCR NEGATIVE NEGATIVE Final    Comment: (NOTE) SARS-CoV-2 target nucleic acids are NOT DETECTED.  The SARS-CoV-2 RNA is generally detectable in upper respiratory specimens during the acute phase of infection. The lowest concentration of SARS-CoV-2 viral copies this assay can detect is 138 copies/mL. A negative result does not preclude SARS-Cov-2 infection and should not be used as the sole basis for treatment or other patient management decisions. A negative result may occur with  improper  specimen collection/handling, submission of specimen other than nasopharyngeal swab, presence of viral mutation(s) within the areas targeted by this assay, and inadequate number of viral copies(<138 copies/mL). A negative result must be combined with clinical observations, patient history, and epidemiological information. The expected result is Negative.  Fact Sheet for Patients:  EntrepreneurPulse.com.au  Fact Sheet for Healthcare Providers:  IncredibleEmployment.be  This test is no t yet approved or cleared by the [place] FDA and  has been authorized for detection and/or diagnosis of SARS-CoV-2 by FDA under an Emergency Use Authorization (EUA). This EUA will remain  in effect (meaning this test can be used) for the duration of the COVID-19 declaration under Section 564(b)(1) of the Act, 21 U.S.C.section 360bbb-3(b)(1), unless the authorization is terminated  or revoked sooner.       Influenza A by PCR NEGATIVE NEGATIVE Final   Influenza B by PCR NEGATIVE NEGATIVE Final    Comment: (NOTE) The Xpert Xpress SARS-CoV-2/FLU/RSV plus assay is intended as an aid in the diagnosis of influenza from Nasopharyngeal swab specimens and should not be used as a sole basis for treatment. Nasal washings and aspirates are unacceptable for Xpert Xpress SARS-CoV-2/FLU/RSV testing.  Fact Sheet for Patients: EntrepreneurPulse.com.au  Fact Sheet for Healthcare Providers: IncredibleEmployment.be  This test is not yet approved or cleared by the [place] FDA and has been authorized for detection and/or diagnosis of SARS-CoV-2 by FDA under an Emergency Use Authorization (EUA). This EUA will remain in effect (meaning this test can be used) for the duration of the COVID-19 declaration under Section 564(b)(1) of the Act, 21 U.S.C. section 360bbb-3(b)(1), unless the authorization is terminated or revoked.  Performed at  [facility], [place] [address], [place], [place] [address]      Labs: BNP (last 3 results) No results for input(s): BNP in the last 8760 hours. Basic Metabolic Panel: Recent Labs  Lab 11/19/21 1115 11/19/21 1500 11/20/21 0314 11/21/21 0504  NA 137  --  138 134*  K 5.0  --  4.4 4.2  CL 100  --  104 105  CO2 23  --  26 22  GLUCOSE 375*  --  216* 204*  BUN 19  --  22 18  CREATININE 1.11*  --  1.14* 0.92  CALCIUM 9.2  --  8.4* 7.8*  MG  --  1.9  --  1.8   Liver Function Tests: No results for input(s): AST, ALT, ALKPHOS, BILITOT, PROT, ALBUMIN in the last 168 hours. No results for input(s): LIPASE, AMYLASE in the last 168 hours. No results for input(s): AMMONIA in the last 168 hours. CBC: Recent Labs  Lab 11/19/21 1115 11/20/21 0314 11/21/21 0504  WBC 12.4* 10.1 7.9  NEUTROABS 11.1*  --   --   HGB 11.6* 9.2* 9.3*  HCT 36.6 28.4* 27.9*  MCV 89.7 88.8 88.9  PLT 202 157 145*   Cardiac Enzymes: Recent Labs  Lab 11/19/21 1115 11/20/21 0314 11/21/21 0504  CKTOTAL 949* 1,880* 867*   BNP: Invalid input(s): POCBNP CBG: Recent Labs  Lab 11/24/21 1139 11/24/21 1738 11/24/21 2117 11/25/21 0311 11/25/21 0748  GLUCAP 188* 229* 153* 167* 206*   D-Dimer No results for input(s): DDIMER in the last 72 hours. Hgb A1c No results for input(s): HGBA1C in the last 72 hours. Lipid Profile No results for input(s): CHOL, HDL, LDLCALC, TRIG, CHOLHDL, LDLDIRECT in the last 72 hours. Thyroid function studies No results for input(s): TSH, T4TOTAL, T3FREE, THYROIDAB in the last 72 hours.  Invalid input(s): FREET3 Anemia work up No results for input(s): VITAMINB12, FOLATE, FERRITIN, TIBC, IRON, RETICCTPCT in the last 72 hours. Urinalysis    Component Value Date/Time   COLORURINE YELLOW 10/14/2020 1216   APPEARANCEUR CLEAR 10/14/2020 1216   LABSPEC 1.009 10/14/2020 1216   PHURINE 6.0 10/14/2020 1216   GLUCOSEU >=500 (A) 10/14/2020 1216   HGBUR NEGATIVE 10/14/2020 1216    BILIRUBINUR NEGATIVE 10/14/2020 1216   KETONESUR 5 (A) 10/14/2020 1216   PROTEINUR NEGATIVE 10/14/2020 1216   UROBILINOGEN 0.2 10/07/2014 1808   NITRITE NEGATIVE 10/14/2020 1216   LEUKOCYTESUR NEGATIVE 10/14/2020 1216   Sepsis Labs Invalid input(s): PROCALCITONIN,  WBC,  LACTICIDVEN Microbiology Recent Results (from the past 240 hour(s))  Resp Panel by RT-PCR (Flu A&B, Covid) Nasopharyngeal Swab     Status: None   Collection Time: 11/19/21 11:15 AM   Specimen: Nasopharyngeal Swab; Nasopharyngeal(NP) swabs in vial transport medium  Result Value Ref Range Status   SARS Coronavirus 2 by RT PCR NEGATIVE NEGATIVE Final    Comment: (NOTE) SARS-CoV-2 target nucleic acids are NOT DETECTED.  The SARS-CoV-2 RNA is generally detectable in upper respiratory specimens during the acute phase of infection. The lowest concentration of SARS-CoV-2 viral copies this assay can detect is 138 copies/mL. A negative result does not preclude SARS-Cov-2 infection and should not be used as the sole basis for treatment or other patient management decisions. A negative result may occur with  improper specimen collection/handling, submission of specimen other than nasopharyngeal swab, presence of viral mutation(s) within the areas targeted by this assay, and inadequate number of viral copies(<138 copies/mL). A negative result must be combined with clinical observations, patient history, and epidemiological information. The expected result is Negative.  Fact Sheet for Patients:  BloggerCourse.com  Fact Sheet for Healthcare Providers:  SeriousBroker.it  This test is no t yet approved or cleared by the [place] FDA and  has been authorized for detection and/or diagnosis of SARS-CoV-2 by FDA under an Emergency Use Authorization (EUA). This EUA will remain  in effect (meaning this test can be used)  for the duration of the COVID-19 declaration under  Section 564(b)(1) of the Act, 21 U.S.C.section 360bbb-3(b)(1), unless the authorization is terminated  or revoked sooner.       Influenza A by PCR NEGATIVE NEGATIVE Final   Influenza B by PCR NEGATIVE NEGATIVE Final    Comment: (NOTE) The Xpert Xpress SARS-CoV-2/FLU/RSV plus assay is intended as an aid in the diagnosis of influenza from Nasopharyngeal swab specimens and should not be used as a sole basis for treatment. Nasal washings and aspirates are unacceptable for Xpert Xpress SARS-CoV-2/FLU/RSV testing.  Fact Sheet for Patients: EntrepreneurPulse.com.au  Fact Sheet for Healthcare Providers: IncredibleEmployment.be  This test is not yet approved or cleared by the [place] FDA and has been authorized for detection and/or diagnosis of SARS-CoV-2 by FDA under an Emergency Use Authorization (EUA). This EUA will remain in effect (meaning this test can be used) for the duration of the COVID-19 declaration under Section 564(b)(1) of the Act, 21 U.S.C. section 360bbb-3(b)(1), unless the authorization is terminated or revoked.  Performed at [facility], [place] [address] [place], [place] [address]   Resp Panel by RT-PCR (Flu A&B, Covid) Nasopharyngeal Swab     Status: None   Collection Time: 11/24/21 11:36 AM   Specimen: Nasopharyngeal Swab; Nasopharyngeal(NP) swabs in vial transport medium  Result Value Ref Range Status   SARS Coronavirus 2 by RT PCR NEGATIVE NEGATIVE Final    Comment: (NOTE) SARS-CoV-2 target nucleic acids are NOT DETECTED.  The SARS-CoV-2 RNA is generally detectable in upper respiratory specimens during the acute phase of infection. The lowest concentration of SARS-CoV-2 viral copies this assay can detect is 138 copies/mL. A negative result does not preclude SARS-Cov-2 infection and should not be used as the sole basis for treatment or other patient management decisions. A negative result may occur with   improper specimen collection/handling, submission of specimen other than nasopharyngeal swab, presence of viral mutation(s) within the areas targeted by this assay, and inadequate number of viral copies(<138 copies/mL). A negative result must be combined with clinical observations, patient history, and epidemiological information. The expected result is Negative.  Fact Sheet for Patients:  EntrepreneurPulse.com.au  Fact Sheet for Healthcare Providers:  IncredibleEmployment.be  This test is no t yet approved or cleared by the [place] FDA and  has been authorized for detection and/or diagnosis of SARS-CoV-2 by FDA under an Emergency Use Authorization (EUA). This EUA will remain  in effect (meaning this test can be used) for the duration of the COVID-19 declaration under Section 564(b)(1) of the Act, 21 U.S.C.section 360bbb-3(b)(1), unless the authorization is terminated  or revoked sooner.       Influenza A by PCR NEGATIVE NEGATIVE Final   Influenza B by PCR NEGATIVE NEGATIVE Final    Comment: (NOTE) The Xpert Xpress SARS-CoV-2/FLU/RSV plus assay is intended as an aid in the diagnosis of influenza from Nasopharyngeal swab specimens and should not be used as a sole basis for treatment. Nasal washings and aspirates are unacceptable for Xpert Xpress SARS-CoV-2/FLU/RSV testing.  Fact Sheet for Patients: EntrepreneurPulse.com.au  Fact Sheet for Healthcare Providers: IncredibleEmployment.be  This test is not yet approved or cleared by the [place] FDA and has been authorized for detection and/or diagnosis of SARS-CoV-2 by FDA under an Emergency Use Authorization (EUA). This EUA will remain in effect (meaning this test can be used) for the duration of the COVID-19 declaration under Section 564(b)(1) of the Act, 21 U.S.C. section 360bbb-3(b)(1), unless the authorization is terminated  or revoked.  Performed at [facility], [place] [address], [place], [place] [address]    SIGNED:   Charlynne Cousins, MD  [facility] 11/25/2021, 9:21 AM Pager   If 7PM-7AM, please contact night-coverage [email or web address] Password [email or web address]

## 2021-11-25 NOTE — TOC Transition Note (Signed)
Transition of Care [facility]) - CM/SW Discharge Note   Patient Details  Name: Danielle Clarke MRN: [id number] Date of Birth: October 09, 1941  Transition [facility]) CM/SW Contact:  Carley Hammed, LCSWA Phone Number: 11/25/2021, 10:21 AM   Clinical Narrative:    Pt to be transported to [facility] via Daughter. Nurse to call report to [phone]. TOC will sign off at this time, all discharge needs completed at this time.   Final next level of care: Skilled Nursing Facility Barriers to Discharge: Barriers Resolved   Patient Goals and CMS Choice Patient states their goals for this hospitalization and ongoing recovery are:: to go to SNF CMS Medicare.gov Compare Post Acute Care list provided to:: Other (Comment Required) Choice offered to / list presented to : Adult Children  Discharge Placement              Patient chooses bed at: [facility] Patient to be transferred to facility by: Family Name of family member notified: Oletha Cruel Patient and family notified of of transfer: 11/25/21  Discharge Plan and Services     Post Acute Care Choice: Home Health                               Social Determinants of Health (SDOH) Interventions     Readmission Risk Interventions Readmission Risk Prevention Plan 10/15/2020  Post Dischage Appt Complete  Medication Screening Complete  Transportation Screening Complete  Some recent data might be hidden

## 2021-11-25 NOTE — Progress Notes (Signed)
Patients daughter is taking her to a SNF.  Daughter was given discharge instructions and prescriptions to take with her.

## 2021-11-25 NOTE — Plan of Care (Signed)
°  Problem: Education: Goal: Knowledge of General Education information will improve Description: Including pain rating scale, medication(s)/side effects and non-pharmacologic comfort measures Outcome: Adequate for Discharge   Problem: Health Behavior/Discharge Planning: Goal: Ability to manage health-related needs will improve Outcome: Adequate for Discharge   Problem: Clinical Measurements: Goal: Ability to maintain clinical measurements within normal limits will improve Outcome: Adequate for Discharge Goal: Will remain free from infection Outcome: Adequate for Discharge Goal: Diagnostic test results will improve Outcome: Adequate for Discharge Goal: Respiratory complications will improve Outcome: Adequate for Discharge Goal: Cardiovascular complication will be avoided Outcome: Adequate for Discharge   Problem: Activity: Goal: Risk for activity intolerance will decrease Outcome: Adequate for Discharge   Problem: Nutrition: Goal: Adequate nutrition will be maintained Outcome: Adequate for Discharge   Problem: Coping: Goal: Level of anxiety will decrease Outcome: Adequate for Discharge   Problem: Elimination: Goal: Will not experience complications related to bowel motility Outcome: Adequate for Discharge Goal: Will not experience complications related to urinary retention Outcome: Adequate for Discharge   Problem: Pain Managment: Goal: General experience of comfort will improve Outcome: Adequate for Discharge   Problem: Safety: Goal: Ability to remain free from injury will improve Outcome: Adequate for Discharge   Problem: Skin Integrity: Goal: Risk for impaired skin integrity will decrease Outcome: Adequate for Discharge   Problem: Acute Rehab PT Goals(only PT should resolve) Goal: Pt Will Go Supine/Side To Sit Outcome: Adequate for Discharge Goal: Patient Will Transfer Sit To/From Stand Outcome: Adequate for Discharge Goal: Pt Will Ambulate Outcome: Adequate  for Discharge Goal: Pt Will Go Up/Down Stairs Outcome: Adequate for Discharge   Problem: Acute Rehab OT Goals (only OT should resolve) Goal: Pt. Will Perform Grooming Outcome: Adequate for Discharge Goal: Pt. Will Perform Upper Body Dressing Outcome: Adequate for Discharge Goal: Pt. Will Perform Lower Body Dressing Outcome: Adequate for Discharge Goal: Pt. Will Transfer To Toilet Outcome: Adequate for Discharge Goal: Pt. Will Perform Toileting-Clothing Manipulation Outcome: Adequate for Discharge Goal: Pt/Caregiver Will Perform Home Exercise Program Outcome: Adequate for Discharge

## 2021-11-25 NOTE — Progress Notes (Signed)
Remote pacemaker transmission.   

## 2021-11-25 NOTE — NC FL2 (Signed)
[place] MEDICAID FL2 LEVEL OF CARE SCREENING TOOL     IDENTIFICATION  Patient Name: Danielle Clarke Birthdate: 1941/10/08 Sex: female Admission Date (Current Location): 11/19/2021  [facility] and [place] Number:  Producer, television/film/video and Address:  [facility] [place], [address], [place], [place] [address]      Provider Number: [id number]  Attending Physician Name and Address:  David Stall, Darin Engels, MD  Relative Name and Phone Number:  Huntleigh Doolen [phone]    Current Level of Care: Hospital Recommended Level of Care: Skilled Nursing Facility Prior Approval Number:    Date Approved/Denied:   PASRR Number: [id number]  Discharge Plan: SNF    Current Diagnoses: Patient Active Problem List   Diagnosis Date Noted   Closed comminuted left humeral fracture 11/19/2021   Prolonged QT interval 11/19/2021   Elevated CK 11/19/2021   CKD (chronic kidney disease), stage III (HCC) 10/14/2020   Abnormality of gait 12/01/2019   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) 12/01/2019   Traumatic brain injury with loss of consciousness (HCC) 05/28/2019   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 05/20/2019   Hypertensive crisis    Hypokalemia    Leukocytosis    Lethargy    Labile blood pressure    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    Acute blood loss anemia    Low grade fever    Hypoglycemia    Multiple closed fractures of ribs of right side    Pleural effusion on right    Hypoalbuminemia due to protein-calorie malnutrition (HCC)    Dysphagia    Traumatic cerebral intraparenchymal hematoma 02/14/2019   Pacemaker    Labile blood glucose    Diabetes mellitus type 2 in nonobese (HCC)    Dyslipidemia    Essential hypertension    Hypernatremia    Anemia of chronic disease    Hypertensive urgency 02/07/2019   Intracerebral hemorrhage (HCC) 02/07/2019   Intracranial bleeding (HCC) 02/06/2019    Elevated troponin    Abnormal EKG    Bronchitis 08/05/2017   Bilateral carotid artery stenosis    Aortic stenosis 08/25/2015   Cervical stenosis of spinal canal 07/31/2014   Essential hypertension, benign 12/30/2013   Hyperlipidemia 12/30/2013   Toe infection 12/30/2013   Acute renal failure (HCC) 12/29/2013   Syncope 01/02/2013   Diabetes mellitus type 1 (HCC) 01/02/2013   Bilateral carotid artery disease (HCC) 01/02/2013   GOITER, MULTINODULAR 02/11/2008    Orientation RESPIRATION BLADDER Height & Weight     Self, Time, Situation, Place  Normal Continent, External catheter Weight: 120 lb (54.4 kg) Height:  5' (152.4 cm)  BEHAVIORAL SYMPTOMS/MOOD NEUROLOGICAL BOWEL NUTRITION STATUS      Continent Diet (See DC summary)  AMBULATORY STATUS COMMUNICATION OF NEEDS Skin   Limited Assist Verbally Normal                       Personal Care Assistance Level of Assistance  Bathing, Feeding, Dressing Bathing Assistance: Limited assistance Feeding assistance: Independent Dressing Assistance: Limited assistance     Functional Limitations Info  Sight, Hearing, Speech Sight Info: Adequate Hearing Info: Adequate Speech Info: Adequate    SPECIAL CARE FACTORS FREQUENCY  PT (By licensed PT), OT (By licensed OT)     PT Frequency: 5x week OT Frequency: 5x week            Contractures Contractures Info: Not present  Additional Factors Info  Code Status, Allergies, Insulin Sliding Scale Code Status Info: Full Allergies Info: Cefaclor   Tetracycline   Vancomycin   Augmentin (Amoxicillin-pot Clavulanate)   Nsaids   Quinapril Hcl   Sulfamethoxazole-trimethoprim   Prednisone   Insulin Sliding Scale Info: See DC summary       Current Medications (11/25/2021):  This is the current hospital active medication list Current Facility-Administered Medications  Medication Dose Route Frequency Provider Last Rate Last Admin   acetaminophen (TYLENOL) tablet 650 mg  650 mg Oral Q6H  PRN Orma Flaming, MD   650 mg at 11/25/21 [id number]   Or   acetaminophen (TYLENOL) suppository 650 mg  650 mg Rectal Q6H PRN Orma Flaming, MD       enoxaparin (LOVENOX) injection 30 mg  30 mg Subcutaneous Q24H Vance Gather B, MD   30 mg at 11/24/21 1805   insulin aspart (novoLOG) injection 0-5 Units  0-5 Units Subcutaneous QHS Vance Gather B, MD       insulin aspart (novoLOG) injection 0-6 Units  0-6 Units Subcutaneous TID WC Patrecia Pour, MD   2 Units at 11/25/21 [id number]   insulin aspart (novoLOG) injection 3 Units  3 Units Subcutaneous TID WC Charlynne Cousins, MD   3 Units at 11/25/21 [id number]   insulin glargine-yfgn [facility]) injection 8 Units  8 Units Subcutaneous BID Charlynne Cousins, MD   8 Units at 11/25/21 0925   lidocaine (LIDODERM) 5 % 1 patch  1 patch Transdermal Q24H Charlynne Cousins, MD       loratadine (CLARITIN) tablet 10 mg  10 mg Oral Daily Orma Flaming, MD   10 mg at 11/25/21 [id number]   oxyCODONE-acetaminophen (PERCOCET/ROXICET) 5-325 MG per tablet 1-2 tablet  1-2 tablet Oral Q4H PRN Charlynne Cousins, MD   2 tablet at 11/24/21 2211   prochlorperazine (COMPAZINE) injection 5 mg  5 mg Intravenous Q6H PRN Orma Flaming, MD   5 mg at 11/21/21 1827   sodium chloride flush (NS) 0.9 % injection 3 mL  3 mL Intravenous Q12H Orma Flaming, MD   3 mL at 11/23/21 0845     Discharge Medications: Please see discharge summary for a list of discharge medications.  Relevant Imaging Results:  Relevant Lab Results:   Additional Information SS# [place], LCSWA

## 2021-12-29 DIAGNOSIS — Z9889 Other specified postprocedural states: Secondary | ICD-10-CM | POA: Diagnosis not present

## 2021-12-29 DIAGNOSIS — M25512 Pain in left shoulder: Secondary | ICD-10-CM | POA: Diagnosis not present

## 2022-01-16 NOTE — Progress Notes (Signed)
Cardiology Office Note:    Date:  01/17/2022   ID:  [name], DOB 10/07/1941, MRN [id number]  PCP:  Hulan Fess, MD  Cardiologist:  Sinclair Grooms, MD   Referring MD: Hulan Fess, MD   Chief Complaint  Patient presents with   Cardiac Valve Problem    Aortic stenosis    History of Present Illness:    Danielle Clarke is a 81 y.o. female with a hx of severe aortic stenosis, recurring episodes of syncope, DM I,  bilateral carotid disease, and essential hypertension.. 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  Please see echocardiogram report below.  Aortic valve maximal velocity is 3.95 m/s.  Asymptomatic relative to syncope, angina, and dyspnea.  No edema.  Fell and fractured her left shoulder.  Tripped on a rug.  Past Medical History:  Diagnosis Date   Aortic stenosis, mild    Arthritis    Asthmatic bronchitis    with colds per patient   Carotid artery disease (HCC)    40-59% bilateral ICA stenosis   Cataract    Cutaneous abscess of right foot    Glaucoma    Heart murmur    Dr Tamala Julian is her  cardiologist.    Hyperlipemia    Hypertension    Dr. Tamala Julian ~ 2 years ago   Neck fracture [facility])    july 2013   Osteoporosis    Syncope    Type 1 diabetes mellitus [facility])     Past Surgical History:  Procedure Laterality Date   ANKLE FRACTURE SURGERY Left 2001   steel plate and 3 screws    ANTERIOR CERVICAL CORPECTOMY N/A 07/31/2014   Procedure: Cervical Four to Cervical Six Corpectomy;  Surgeon: Floyce Stakes, MD;  Location: [facility];  Service: Neurosurgery;  Laterality: N/A;  C4 to C6 Corpectomy   BREAST SURGERY  1988   CATARACT EXTRACTION W/ INTRAOCULAR LENS  IMPLANT, BILATERAL Bilateral 2011   right and then left   EYE SURGERY     FRACTURE SURGERY     HAMMER TOE SURGERY  1998   I & D EXTREMITY Right 09/27/2018   Procedure: IRRIGATION AND DEBRIDEMENT RIGHT FOOT;  Surgeon: Newt Minion, MD;  Location: [place];  Service: Orthopedics;   Laterality: Right;   JOINT REPLACEMENT     PACEMAKER IMPLANT N/A 02/10/2019   Procedure: PACEMAKER IMPLANT;  Surgeon: Evans Lance, MD;  Location: [place] [facility];  Service: Cardiovascular;  Laterality: N/A;   [place]   TOTAL SHOULDER REPLACEMENT  2010   right shoulder    TUBAL LIGATION  1979   TYMPANOSTOMY TUBE PLACEMENT Bilateral     Current Medications: Current Meds  Medication Sig   Camphor-Menthol-Methyl Sal (SALONPAS EX) Apply 1 patch topically daily as needed (pain).   HUMALOG KWIKPEN 100 UNIT/ML KwikPen Inject 0-6 Units into the skin 3 (three) times daily as needed (high blood sugar). BS  80-150=2 units 151-200=3 units 201-250=4 units 251-300=5 units 301-350=6 units 351-400=7 units 401-450=8 units   ibuprofen (ADVIL) 200 MG tablet Take 200 mg by mouth 2 (two) times daily.   Insulin Glargine (LANTUS) 100 UNIT/ML Solostar Pen Inject 13 Units into the skin daily. (Patient taking differently: Inject 12 Units into the skin daily.)   lidocaine (LIDODERM) 5 % Place 1 patch onto the skin daily. Remove & Discard patch within 12 hours or as directed by MD   loratadine (CLARITIN) 10 MG tablet Take 1 tablet (10 mg total)  by mouth daily.   Multiple Vitamin (MULTIVITAMIN WITH MINERALS) TABS tablet Take 1 tablet by mouth daily.   polyvinyl alcohol (LIQUIFILM TEARS) 1.4 % ophthalmic solution Place 1 drop into both eyes as needed for dry eyes.   simvastatin (ZOCOR) 40 MG tablet Take 40 mg by mouth daily.   VITAMIN D PO Take 1 tablet by mouth daily.     Allergies:   Cefaclor, Tetracycline, Vancomycin, Augmentin [amoxicillin-pot clavulanate], Nsaids, Quinapril hcl, Sulfamethoxazole-trimethoprim, and Prednisone   Social History   Socioeconomic History   Marital status: Divorced    Spouse name: Not on file   Number of children: 2   Years of education: [employer]   Highest education level: Not on file  Occupational History   Occupation: Retired     Fish farm manager: RETIRED  Tobacco Use   Smoking status: Never   Smokeless tobacco: Never  Vaping Use   Vaping Use: Never used  Substance and Sexual Activity   Alcohol use: Yes    Alcohol/week: 2.0 standard drinks    Types: 2 Glasses of wine per week   Drug use: Never   Sexual activity: Never  Other Topics Concern   Not on file  Social History Narrative   Patient lives at home alone.   Caffeine Use:    Social Determinants of Health   Financial Resource Strain: Not on file  Food Insecurity: Not on file  Transportation Needs: Not on file  Physical Activity: Not on file  Stress: Not on file  Social Connections: Not on file     Family History: The patient's family history includes Cancer in her mother; Heart Problems in her father.  ROS:   Please see the history of present illness.    Fractured her left shoulder.  In rehab.  Spent a time in a rehab facility getting back to [place].  Now at home.  Someone is always with her.  After she fell and fractured the shoulder, she was on the floor for over 12 hours before being found.  All other systems reviewed and are negative.  EKGs/Labs/Other Studies Reviewed:    The following studies were reviewed today:  Bilateral carotid Doppler 07/2020: Summary:  Right Carotid: Velocities in the right ICA are consistent with a 1-39%  stenosis.                 Non-hemodynamically significant plaque <50% noted in the  CCA.   Left Carotid: Velocities in the left ICA are consistent with a 1-39%  stenosis.                Non-hemodynamically significant plaque <50% noted in the  CCA. The                ECA appears >50% stenosed.   Vertebrals:  Bilateral vertebral arteries demonstrate antegrade flow.  Subclavians: Normal flow hemodynamics were seen in bilateral subclavian               arteries.   2D Doppler echocardiogram November 19, 2021: IMPRESSIONS   1. Left ventricular ejection fraction, by estimation, is 65 to 70%. The  left ventricle has  normal function. Left ventricular endocardial border  not optimally defined to evaluate regional wall motion. There is mild  concentric left ventricular  hypertrophy. Left ventricular diastolic parameters are consistent with  Grade I diastolic dysfunction (impaired relaxation). Elevated left  ventricular end-diastolic pressure.   2. RV-RA gradient 35 mmHg suggests at least mildly increased PASP. Right  ventricular systolic function is  normal. The right ventricular size is  normal.   3. The mitral valve is abnormal. Mild to moderate mitral valve  regurgitation. Mild to moderate mitral stenosis. The mean mitral valve  gradient is 7.0 mmHg. Moderate mitral annular calcification.   4. The aortic valve has an indeterminant number of cusps. There is  moderate calcification of the aortic valve. Aortic valve regurgitation is  not visualized. Severe aortic valve stenosis. Aortic valve mean gradient  measures 38.8 mmHg.   5. Unable to estimate CVP.   Comparison(s): Prior images reviewed side by side. LVEF vigorous. Aortic  stenosis more clearly in severe range, although valve not well visualized.  Mild to moderate mitral stenosis and regurgitation as well.   EKG:  EKG not repeated  Recent Labs: 11/19/2021: TSH 0.910 11/21/2021: BUN 18; Creatinine, Ser 0.92; Hemoglobin 9.3; Magnesium 1.8; Platelets 145; Potassium 4.2; Sodium 134  Recent Lipid Panel No results found for: CHOL, TRIG, HDL, CHOLHDL, VLDL, LDLCALC, LDLDIRECT  Physical Exam:    VS:  BP 118/68    Pulse 82    Ht 5' (1.524 m)    Wt 112 lb 6.4 oz (51 kg)    SpO2 93%    BMI 21.95 kg/m     Wt Readings from Last 3 Encounters:  01/17/22 112 lb 6.4 oz (51 kg)  11/25/21 120 lb (54.4 kg)  09/30/21 118 lb 9.6 oz (53.8 kg)     GEN: Elderly and somewhat frail. No acute distress HEENT: Normal NECK: No JVD.  Bilateral transmitted carotid bruits, high-pitched. LYMPHATICS: No lymphadenopathy CARDIAC: 4/6 somewhat musical high-pitched  diamond-shaped systolic murmur compatible with aortic stenosis. RRR S4 gallop, or edema. VASCULAR:  Normal Pulses. No bruits. RESPIRATORY:  Clear to auscultation without rales, wheezing or rhonchi  ABDOMEN: Soft, non-tender, non-distended, No pulsatile mass, MUSCULOSKELETAL: No deformity  SKIN: Warm and dry NEUROLOGIC:  Alert and oriented x 3 PSYCHIATRIC:  Normal affect   ASSESSMENT:    1. Aortic valve stenosis, etiology of cardiac valve disease unspecified   2. Cardiac pacemaker in situ   3. Heart block AV complete (HCC)   4. Primary hypertension   5. Bilateral carotid artery stenosis    PLAN:    In order of problems listed above:  Severe aortic stenosis based upon the most recent echo.  In her lifestyle, she is not symptomatic.  She is more sedentary than active.  We discussed management of aortic stenosis.  Being devoid of current symptoms, we have decided to continue to follow.  Will be particularly difficult to have work-up done now with the left shoulder situation and limitations related to that.  We did discuss SAVR versus TAVR.  SAVR would not be a good idea going forward. Continue to follow in pacemaker clinic TAVR related conduction abnormality would not be a great concern As aortic stenosis has become more severe, systolic pressure has become lower. Last evaluation bilateral less than 39% stenosis.  Continue secondary prevention.  Monitor for signs and symptoms related to aortic stenosis.  Consider beginning a work-up with aimed towards management within the next 6 to 12 months.  She will return in 6 months for clinical follow-up, hopefully with a healed left shoulder at that time.   Medication Adjustments/Labs and Tests Ordered: Current medicines are reviewed at length with the patient today.  Concerns regarding medicines are outlined above.  No orders of the defined types were placed in this encounter.  No orders of the defined types were placed in this  encounter.  Patient Instructions  Medication Instructions:  Your physician recommends that you continue on your current medications as directed. Please refer to the Current Medication list given to you today.  *If you need a refill on your cardiac medications before your next appointment, please call your pharmacy*   Lab Work: NONE If you have labs (blood work) drawn today and your tests are completely normal, you will receive your results only by: [place] (if you have MyChart) OR A paper copy in the mail If you have any lab test that is abnormal or we need to change your treatment, we will call you to review the results.   Testing/Procedures: NONE   Follow-Up: At [facility], you and your health needs are our priority.  As part of our continuing mission to provide you with exceptional heart care, we have created designated Provider Care Teams.  These Care Teams include your primary Cardiologist (physician) and Advanced Practice Providers (APPs -  Physician Assistants and Nurse Practitioners) who all work together to provide you with the care you need, when you need it.  We recommend signing up for the patient portal called "MyChart".  Sign up information is provided on this After Visit Summary.  MyChart is used to connect with patients for Virtual Visits (Telemedicine).  Patients are able to view lab/test results, encounter notes, upcoming appointments, etc.  Non-urgent messages can be sent to your provider as well.   To learn more about what you can do with MyChart, go to [email or web address].    Your next appointment:   6 month(s)  The format for your next appointment:   In Person  Provider:   Sinclair Grooms, MD   Signed, Sinclair Grooms, MD  01/17/2022 1:55 PM    [place]

## 2022-01-17 ENCOUNTER — Encounter: Payer: Self-pay | Admitting: Interventional Cardiology

## 2022-01-17 ENCOUNTER — Ambulatory Visit (INDEPENDENT_AMBULATORY_CARE_PROVIDER_SITE_OTHER): Payer: Medicare PPO | Admitting: Interventional Cardiology

## 2022-01-17 ENCOUNTER — Other Ambulatory Visit: Payer: Self-pay

## 2022-01-17 VITALS — BP 118/68 | HR 82 | Ht 60.0 in | Wt 112.4 lb

## 2022-01-17 DIAGNOSIS — I442 Atrioventricular block, complete: Secondary | ICD-10-CM

## 2022-01-17 DIAGNOSIS — Z950 Presence of cardiac pacemaker: Secondary | ICD-10-CM | POA: Diagnosis not present

## 2022-01-17 DIAGNOSIS — I10 Essential (primary) hypertension: Secondary | ICD-10-CM | POA: Diagnosis not present

## 2022-01-17 DIAGNOSIS — I6523 Occlusion and stenosis of bilateral carotid arteries: Secondary | ICD-10-CM

## 2022-01-17 DIAGNOSIS — I350 Nonrheumatic aortic (valve) stenosis: Secondary | ICD-10-CM

## 2022-01-17 NOTE — Patient Instructions (Signed)
Medication Instructions:  °Your physician recommends that you continue on your current medications as directed. Please refer to the Current Medication list given to you today. ° °*If you need a refill on your cardiac medications before your next appointment, please call your pharmacy* ° ° °Lab Work: °NONE °If you have labs (blood work) drawn today and your tests are completely normal, you will receive your results only by: °MyChart Message (if you have MyChart) OR °A paper copy in the mail °If you have any lab test that is abnormal or we need to change your treatment, we will call you to review the results. ° ° °Testing/Procedures: °NONE ° ° °Follow-Up: °At [facility], you and your health needs are our priority.  As part of our continuing mission to provide you with exceptional heart care, we have created designated Provider Care Teams.  These Care Teams include your primary Cardiologist (physician) and Advanced Practice Providers (APPs -  Physician Assistants and Nurse Practitioners) who all work together to provide you with the care you need, when you need it. ° °We recommend signing up for the patient portal called "MyChart".  Sign up information is provided on this After Visit Summary.  MyChart is used to connect with patients for Virtual Visits (Telemedicine).  Patients are able to view lab/test results, encounter notes, upcoming appointments, etc.  Non-urgent messages can be sent to your provider as well.   °To learn more about what you can do with MyChart, go to [email or web address].   ° °Your next appointment:   °6 month(s) ° °The format for your next appointment:   °In Person ° °Provider:   °Henry W Smith III, MD  °

## 2022-02-09 DIAGNOSIS — M25512 Pain in left shoulder: Secondary | ICD-10-CM | POA: Diagnosis not present

## 2022-02-09 DIAGNOSIS — Z9889 Other specified postprocedural states: Secondary | ICD-10-CM | POA: Diagnosis not present

## 2022-02-15 ENCOUNTER — Ambulatory Visit (INDEPENDENT_AMBULATORY_CARE_PROVIDER_SITE_OTHER): Payer: Medicare PPO

## 2022-02-15 DIAGNOSIS — I129 Hypertensive chronic kidney disease with stage 1 through stage 4 chronic kidney disease, or unspecified chronic kidney disease: Secondary | ICD-10-CM | POA: Diagnosis not present

## 2022-02-15 DIAGNOSIS — N1832 Chronic kidney disease, stage 3b: Secondary | ICD-10-CM | POA: Diagnosis not present

## 2022-02-15 DIAGNOSIS — Z950 Presence of cardiac pacemaker: Secondary | ICD-10-CM | POA: Diagnosis not present

## 2022-02-15 DIAGNOSIS — E1022 Type 1 diabetes mellitus with diabetic chronic kidney disease: Secondary | ICD-10-CM | POA: Diagnosis not present

## 2022-02-15 DIAGNOSIS — E785 Hyperlipidemia, unspecified: Secondary | ICD-10-CM | POA: Diagnosis not present

## 2022-02-15 DIAGNOSIS — S42352D Displaced comminuted fracture of shaft of humerus, left arm, subsequent encounter for fracture with routine healing: Secondary | ICD-10-CM | POA: Diagnosis not present

## 2022-02-15 DIAGNOSIS — I442 Atrioventricular block, complete: Secondary | ICD-10-CM

## 2022-02-16 LAB — CUP PACEART REMOTE DEVICE CHECK
Date Time Interrogation Session: 20230322071856
Implantable Lead Implant Date: 20200316
Implantable Lead Implant Date: 20200316
Implantable Lead Location: 753859
Implantable Lead Location: 753860
Implantable Lead Model: 377
Implantable Lead Model: 377
Implantable Lead Serial Number: 81099636
Implantable Lead Serial Number: 81165051
Implantable Pulse Generator Implant Date: 20200316
Pulse Gen Model: 407145
Pulse Gen Serial Number: 69557134

## 2022-02-21 ENCOUNTER — Telehealth: Payer: Self-pay | Admitting: Interventional Cardiology

## 2022-02-21 ENCOUNTER — Other Ambulatory Visit: Payer: Self-pay

## 2022-02-21 ENCOUNTER — Ambulatory Visit (HOSPITAL_COMMUNITY): Payer: Medicare PPO | Attending: Internal Medicine

## 2022-02-21 DIAGNOSIS — I350 Nonrheumatic aortic (valve) stenosis: Secondary | ICD-10-CM | POA: Diagnosis not present

## 2022-02-21 NOTE — Telephone Encounter (Signed)
Danielle Records, MD  Julio Sicks, RN ?Not sure why I ordered this echo so soon. Just had one in December. Not significantly different from December. Make sure I have a 6 month f/u after our last visit ? ? ?Called pt and left detailed message with results.  Advised to call back tomorrow if any questions.  ?

## 2022-02-22 ENCOUNTER — Other Ambulatory Visit: Payer: Self-pay | Admitting: *Deleted

## 2022-02-22 DIAGNOSIS — I6523 Occlusion and stenosis of bilateral carotid arteries: Secondary | ICD-10-CM

## 2022-02-22 LAB — ECHOCARDIOGRAM COMPLETE
AR max vel: 0.56 cm2
AV Area VTI: 0.58 cm2
AV Area mean vel: 0.55 cm2
AV Mean grad: 35.5 mmHg
AV Peak grad: 56.7 mmHg
Ao pk vel: 3.77 m/s
Area-P 1/2: 3.27 cm2
P 1/2 time: 331 msec
S' Lateral: 3.3 cm

## 2022-03-01 NOTE — Progress Notes (Signed)
Remote pacemaker transmission.   

## 2022-03-15 DIAGNOSIS — E1065 Type 1 diabetes mellitus with hyperglycemia: Secondary | ICD-10-CM | POA: Diagnosis not present

## 2022-03-27 DIAGNOSIS — E109 Type 1 diabetes mellitus without complications: Secondary | ICD-10-CM | POA: Diagnosis not present

## 2022-03-27 DIAGNOSIS — J302 Other seasonal allergic rhinitis: Secondary | ICD-10-CM | POA: Diagnosis not present

## 2022-03-27 DIAGNOSIS — E7800 Pure hypercholesterolemia, unspecified: Secondary | ICD-10-CM | POA: Diagnosis not present

## 2022-03-27 DIAGNOSIS — R0989 Other specified symptoms and signs involving the circulatory and respiratory systems: Secondary | ICD-10-CM | POA: Diagnosis not present

## 2022-03-29 ENCOUNTER — Other Ambulatory Visit: Payer: Self-pay | Admitting: Internal Medicine

## 2022-03-29 DIAGNOSIS — R0989 Other specified symptoms and signs involving the circulatory and respiratory systems: Secondary | ICD-10-CM

## 2022-03-30 ENCOUNTER — Other Ambulatory Visit (HOSPITAL_COMMUNITY): Payer: Self-pay

## 2022-03-30 DIAGNOSIS — R1310 Dysphagia, unspecified: Secondary | ICD-10-CM

## 2022-04-05 ENCOUNTER — Ambulatory Visit (HOSPITAL_COMMUNITY)
Admission: RE | Admit: 2022-04-05 | Discharge: 2022-04-05 | Disposition: A | Discharge status: Home / Self Care | Payer: Medicare PPO | Source: Ambulatory Visit | Attending: Cardiovascular Disease | Admitting: Cardiovascular Disease

## 2022-04-05 DIAGNOSIS — I6523 Occlusion and stenosis of bilateral carotid arteries: Secondary | ICD-10-CM | POA: Diagnosis not present

## 2022-04-07 ENCOUNTER — Ambulatory Visit (HOSPITAL_COMMUNITY)
Admission: RE | Admit: 2022-04-07 | Discharge: 2022-04-07 | Disposition: A | Discharge status: Home / Self Care | Payer: Medicare PPO | Source: Ambulatory Visit | Attending: Internal Medicine | Admitting: Internal Medicine

## 2022-04-07 DIAGNOSIS — R1310 Dysphagia, unspecified: Secondary | ICD-10-CM | POA: Insufficient documentation

## 2022-04-07 DIAGNOSIS — R059 Cough, unspecified: Secondary | ICD-10-CM | POA: Diagnosis not present

## 2022-04-07 DIAGNOSIS — K219 Gastro-esophageal reflux disease without esophagitis: Secondary | ICD-10-CM | POA: Diagnosis not present

## 2022-04-07 NOTE — Progress Notes (Signed)
Modified Barium Swallow Progress Note ? ?Patient Details  ?Name: Danielle Clarke ?MRN: [id number] ?Date of Birth: 07/05/1941 ? ?Today's Date: 04/07/2022 ? ?Modified Barium Swallow completed.  Full report located under Chart Review in the Imaging Section. ? ?Brief recommendations include the following: ? ?Clinical Impression ? Pt presents with phrayngeal dysphagia characterized by reduction in tongue base retraction, pharyngeal stripping, and anterior laryngeal movement. She demonstrated mild vallecular and pyriform sinus residue which was reduced with smaller bolus sizes, and reduced to a functional level with a liquid wash. Transport of the 81mm barium was halted at the level of the valleculae, but moved to the hyopharynx with additional boluses of thin liquids. PES relaxation was reduced accross consistencies and a CP bar was demonstrated. The barium tablet was unable to pass the PES despite use of effortful swallows, or additional boluses of thin liquids or puree. Pt was ultimately able to cough the tablet from the hypopharynx to the oral cavity and it was removed thereafter. 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 It is recommended that the pt's current diet of regular texture solids and thin liquids be continued with observance of swallowing precautions. These were discussed with the pt and she reported that she typically observes these precautions. An esophagram has been ordered by Dr. Jacqulyn Bath, but it has not been scheduled as yes. SLP is in agreement with esophageal assessment considering esophageal stasis. ?  ?Swallow Evaluation Recommendations ? ? Recommended Consults: Consider esophageal assessment;Consider GI evaluation ? ? SLP Diet Recommendations: Regular solids;Thin liquid ? ? Liquid Administration via: Cup;Straw ? ? Medication Administration: Whole meds with puree (cut/crush larger pills) ? ? Supervision: Patient able to self feed ? ? Compensations: Slow rate;Small  sips/bites;Follow solids with liquid ? ? Postural Changes: Seated upright at 90 degrees ? ? Oral Care Recommendations: Oral care BID ? ?   ? ?Kadi Hession I. Vear Clock, MS, CCC-SLP ?Acute Rehabilitation Services ?Office number [phone] ?Pager [phone] ? ? ?Scheryl Marten ?04/07/2022,2:28 PM ? ?

## 2022-05-16 ENCOUNTER — Telehealth: Payer: Self-pay | Admitting: Interventional Cardiology

## 2022-05-16 DIAGNOSIS — Z0279 Encounter for issue of other medical certificate: Secondary | ICD-10-CM

## 2022-05-16 NOTE — Telephone Encounter (Signed)
Pt came in to have provider sign documents for the [facility] Payment received, forms are in MD box.  Thank you

## 2022-05-17 ENCOUNTER — Ambulatory Visit (INDEPENDENT_AMBULATORY_CARE_PROVIDER_SITE_OTHER): Payer: Medicare HMO

## 2022-05-17 DIAGNOSIS — I442 Atrioventricular block, complete: Secondary | ICD-10-CM

## 2022-05-18 NOTE — Telephone Encounter (Signed)
DMV documents were completed and returned to the front desk for Pt Pick up.  Thank you

## 2022-05-18 NOTE — Telephone Encounter (Signed)
Pt called to follow up to see if Dr. Katrinka Blazing had reviewed and signed her documents for the [facility]. Pt states that the sooner the better. Please advise.

## 2022-05-18 NOTE — Telephone Encounter (Signed)
Forms completed and signed by DOD Dr. Laurance Flatten in Dr. Michaelle Copas absence. Forms returned to front desk to be picked up by patient.

## 2022-05-19 LAB — CUP PACEART REMOTE DEVICE CHECK
Date Time Interrogation Session: 20230621142547
Implantable Lead Implant Date: 20200316
Implantable Lead Implant Date: 20200316
Implantable Lead Location: 753859
Implantable Lead Location: 753860
Implantable Lead Model: 377
Implantable Lead Model: 377
Implantable Lead Serial Number: 81099636
Implantable Lead Serial Number: 81165051
Implantable Pulse Generator Implant Date: 20200316
Pulse Gen Model: 407145
Pulse Gen Serial Number: 69557134

## 2022-05-29 NOTE — Progress Notes (Signed)
Remote pacemaker transmission.   

## 2022-06-27 DIAGNOSIS — K219 Gastro-esophageal reflux disease without esophagitis: Secondary | ICD-10-CM | POA: Diagnosis not present

## 2022-06-27 DIAGNOSIS — I272 Pulmonary hypertension, unspecified: Secondary | ICD-10-CM | POA: Diagnosis not present

## 2022-06-27 DIAGNOSIS — I10 Essential (primary) hypertension: Secondary | ICD-10-CM | POA: Diagnosis not present

## 2022-06-27 DIAGNOSIS — Z0000 Encounter for general adult medical examination without abnormal findings: Secondary | ICD-10-CM | POA: Diagnosis not present

## 2022-06-27 DIAGNOSIS — I350 Nonrheumatic aortic (valve) stenosis: Secondary | ICD-10-CM | POA: Diagnosis not present

## 2022-06-27 DIAGNOSIS — N1831 Chronic kidney disease, stage 3a: Secondary | ICD-10-CM | POA: Diagnosis not present

## 2022-06-27 DIAGNOSIS — E7800 Pure hypercholesterolemia, unspecified: Secondary | ICD-10-CM | POA: Diagnosis not present

## 2022-06-27 DIAGNOSIS — E109 Type 1 diabetes mellitus without complications: Secondary | ICD-10-CM | POA: Diagnosis not present

## 2022-06-27 DIAGNOSIS — M810 Age-related osteoporosis without current pathological fracture: Secondary | ICD-10-CM | POA: Diagnosis not present

## 2022-06-29 DIAGNOSIS — E7800 Pure hypercholesterolemia, unspecified: Secondary | ICD-10-CM | POA: Diagnosis not present

## 2022-06-29 DIAGNOSIS — I10 Essential (primary) hypertension: Secondary | ICD-10-CM | POA: Diagnosis not present

## 2022-06-29 DIAGNOSIS — M810 Age-related osteoporosis without current pathological fracture: Secondary | ICD-10-CM | POA: Diagnosis not present

## 2022-06-29 DIAGNOSIS — E109 Type 1 diabetes mellitus without complications: Secondary | ICD-10-CM | POA: Diagnosis not present

## 2022-06-29 DIAGNOSIS — N1831 Chronic kidney disease, stage 3a: Secondary | ICD-10-CM | POA: Diagnosis not present

## 2022-07-23 NOTE — Progress Notes (Unsigned)
Cardiology Office Note:    Date:  07/24/2022   ID:  Danielle Clarke, DOB 10-06-1941, MRN [id number]  PCP:  Ollen Bowl, MD  Cardiologist:  Lesleigh Noe, MD   Referring MD: Catha Gosselin, MD   Chief Complaint  Patient presents with   Cardiac Valve Problem    Aortic stenosis    History of Present Illness:    Danielle Clarke is a 81 y.o. female with a hx of severe aortic stenosis, recurring episodes of syncope, DM I,  bilateral carotid disease, and essential hypertension.. 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  She is doing well.  She does not have any of the cardinal symptoms of severe aortic stenosis.  She has exertional fatigue but it is difficult for me to determine if this is cardiac or deconditioning.  She absolutely denies lower extremity swelling and orthopnea/PND.  She has not had any significant chest discomfort.  She did have syncope in 2020 and ended up with a permanent pacemaker.  No syncope since that time.  She walks with a cane.  During a brief conversation today, she would be interested in having valve repair percutaneously if possible via TAVR/MitraClip if indicated.  Past Medical History:  Diagnosis Date   Aortic stenosis, mild    Arthritis    Asthmatic bronchitis    with colds per patient   Carotid artery disease (HCC)    40-59% bilateral ICA stenosis   Cataract    Cutaneous abscess of right foot    Glaucoma    Heart murmur    Dr Katrinka Blazing is her  cardiologist.    Hyperlipemia    Hypertension    Dr. Katrinka Blazing ~ 2 years ago   Neck fracture [facility]    july 2013   Osteoporosis    Syncope    Type 1 diabetes mellitus [facility])     Past Surgical History:  Procedure Laterality Date   ANKLE FRACTURE SURGERY Left 2001   steel plate and 3 screws    ANTERIOR CERVICAL CORPECTOMY N/A 07/31/2014   Procedure: Cervical Four to Cervical Six Corpectomy;  Surgeon: Karn Cassis, MD;  Location: [facility] NEURO ORS;  Service: Neurosurgery;  Laterality: N/A;  C4 to  C6 Corpectomy   BREAST SURGERY  1988   CATARACT EXTRACTION W/ INTRAOCULAR LENS  IMPLANT, BILATERAL Bilateral 2011   right and then left   EYE SURGERY     FRACTURE SURGERY     HAMMER TOE SURGERY  1998   I & D EXTREMITY Right 09/27/2018   Procedure: IRRIGATION AND DEBRIDEMENT RIGHT FOOT;  Surgeon: Nadara Mustard, MD;  Location: [facility] OR;  Service: Orthopedics;  Laterality: Right;   JOINT REPLACEMENT     PACEMAKER IMPLANT N/A 02/10/2019   Procedure: PACEMAKER IMPLANT;  Surgeon: Marinus Maw, MD;  Location: [facility] INVASIVE CV LAB;  Service: Cardiovascular;  Laterality: N/A;   TONSILLECTOMY AND ADENOIDECTOMY  1948   TOTAL SHOULDER REPLACEMENT  2010   right shoulder    TUBAL LIGATION  1979   TYMPANOSTOMY TUBE PLACEMENT Bilateral     Current Medications: Current Meds  Medication Sig   aspirin 81 MG chewable tablet Chew 1 tablet by mouth daily.   Camphor-Menthol-Methyl Sal (SALONPAS EX) Apply 1 patch topically daily as needed (pain).   Cyanocobalamin (VITAMIN B-12 PO) Take 1 tablet by mouth daily.   HUMALOG KWIKPEN 100 UNIT/ML KwikPen Inject 0-6 Units into the skin 3 (three) times daily as needed (high blood sugar). BS  80-150=2 units  151-200=3 units 201-250=4 units 251-300=5 units 301-350=6 units 351-400=7 units 401-450=8 units   ibuprofen (ADVIL) 200 MG tablet Take 200 mg by mouth 2 (two) times daily.   Insulin Glargine (LANTUS) 100 UNIT/ML Solostar Pen Inject 13 Units into the skin daily. (Patient taking differently: Inject 12 Units into the skin daily.)   lidocaine (LIDODERM) 5 % Place 1 patch onto the skin daily. Remove & Discard patch within 12 hours or as directed by MD   loratadine (CLARITIN) 10 MG tablet Take 1 tablet (10 mg total) by mouth daily.   montelukast (SINGULAIR) 10 MG tablet Take 10 mg by mouth at bedtime as needed. allergies   Multiple Vitamin (MULTIVITAMIN WITH MINERALS) TABS tablet Take 1 tablet by mouth daily.   oxyCODONE-acetaminophen (PERCOCET/ROXICET) 5-325 MG  tablet Take 1-2 tablets by mouth every 4 (four) hours as needed for severe pain.   polyvinyl alcohol (LIQUIFILM TEARS) 1.4 % ophthalmic solution Place 1 drop into both eyes as needed for dry eyes.   simvastatin (ZOCOR) 40 MG tablet Take 40 mg by mouth daily.   traMADol (ULTRAM) 50 MG tablet Take 1 tablet by mouth every 6 (six) hours as needed for pain.   VITAMIN D PO Take 1 tablet by mouth daily.     Allergies:   Cefaclor, Tetracycline, Vancomycin, Augmentin [amoxicillin-pot clavulanate], Nsaids, Quinapril hcl, Sulfamethoxazole-trimethoprim, and Prednisone   Social History   Socioeconomic History   Marital status: Divorced    Spouse name: Not on file   Number of children: 2   Years of education: [employer]   Highest education level: Not on file  Occupational History   Occupation: Retired    Fish farm manager: RETIRED  Tobacco Use   Smoking status: Never   Smokeless tobacco: Never  Vaping Use   Vaping Use: Never used  Substance and Sexual Activity   Alcohol use: Yes    Alcohol/week: 2.0 standard drinks of alcohol    Types: 2 Glasses of wine per week   Drug use: Never   Sexual activity: Never  Other Topics Concern   Not on file  Social History Narrative   Patient lives at home alone.   Caffeine Use:    Social Determinants of Health   Financial Resource Strain: Not on file  Food Insecurity: Not on file  Transportation Needs: Not on file  Physical Activity: Not on file  Stress: Not on file  Social Connections: Not on file     Family History: The patient's family history includes Cancer in her mother; Heart Problems in her father.  ROS:   Please see the history of present illness.    Significant back discomfort.  Occasional episodes of dizziness.  Her blood sugars go up and down a lot.  She feels some of her dizziness is related to low blood sugar.  She is in a rush today because a friend brought her to the office and the friend has an appointment later today and is ready to  leave.  All other systems reviewed and are negative.  EKGs/Labs/Other Studies Reviewed:    The following studies were reviewed today:  Bilateral carotid Doppler 04/07/2022: Summary:  Right Carotid: Velocities in the right ICA are consistent with a 1-39%  stenosis.                 Non-hemodynamically significant plaque <50% noted in the  CCA.   Left Carotid: Velocities in the left ICA are consistent with a 1-39%  stenosis.  Non-hemodynamically significant plaque <50% noted in the  CCA. The                ECA appears >50% stenosed.   Vertebrals:  Bilateral vertebral arteries demonstrate antegrade flow.  Subclavians: Normal flow hemodynamics were seen in bilateral subclavian               arteries.   ECHOCARDIOGRAM 01/2022: IMPRESSIONS     1. Left ventricular ejection fraction, by estimation, is 55 to 60%. The  left ventricle has normal function. The left ventricle has no regional  wall motion abnormalities. Left ventricular diastolic parameters are  consistent with Grade I diastolic  dysfunction (impaired relaxation).   2. Right ventricular systolic function is normal. The right ventricular  size is normal.   3. Mild to moderate mitral valve regurgitation. Severe mitral annular  calcification.   4. AV mean gradient 35 mmHg. There is severe calcifcation of the aortic  valve. Aortic valve regurgitation is mild. Aortic valve  sclerosis/calcification is present, with moderate aortic  stenosis.Marland Kitchen  5. The inferior vena cava is normal in size with greater than 50%  respiratory variability, suggesting right atrial pressure of 3 mmHg.   Conclusion(s)/Recommendation(s): Compared to the prior study, AV mean  gradient is stable with mild improvement.   EKG:  EKG not repeated.  Tracing performed November 22, 2021 sinus rhythm at 90 bpm, interventricular conduction delay, and atrial abnormality.  Recent Labs: 11/19/2021: TSH 0.910 11/21/2021: BUN 18; Creatinine, Ser 0.92;  Hemoglobin 9.3; Magnesium 1.8; Platelets 145; Potassium 4.2; Sodium 134  Recent Lipid Panel No results found for: "CHOL", "TRIG", "HDL", "CHOLHDL", "VLDL", "LDLCALC", "LDLDIRECT"  Physical Exam:    VS:  BP (!) 148/58   Pulse 85   Ht 5' (1.524 m)   Wt 110 lb 6.4 oz (50.1 kg)   SpO2 97%   BMI 21.56 kg/m     Wt Readings from Last 3 Encounters:  07/24/22 110 lb 6.4 oz (50.1 kg)  01/17/22 112 lb 6.4 oz (51 kg)  11/25/21 120 lb (54.4 kg)     GEN: Frail. No acute distress. HEENT: Normal NECK: No JVD. LYMPHATICS: No lymphadenopathy CARDIAC: 3/6 crescendo decrescendo systolic murmur at right upper sternal border radiating into the carotids bilaterally.  Also has an apical to left axillary high-pitched systolic murmur of mitral regurgitation.. RRR S4 but no S3 gallop, with trace right lower extremity edema and 1+ left ankle edema. VASCULAR:  Normal Pulses. No bruits. RESPIRATORY:  Clear to auscultation without rales, wheezing or rhonchi  ABDOMEN: Soft, non-tender, non-distended, No pulsatile mass, MUSCULOSKELETAL: No deformity  SKIN: Warm and dry NEUROLOGIC:  Alert and oriented x 3 PSYCHIATRIC:  Normal affect   ASSESSMENT:    1. Nonrheumatic aortic valve stenosis   2. Pacemaker   3. Primary hypertension   4. Bilateral carotid artery stenosis    PLAN:    In order of problems listed above:  Aortic stenosis seems to be progressing.  The interpretation of the echo states that there is "no significant aortic stenosis".  This was a computer entry error.  She would be interested in percutaneous valve management if she develops symptoms related to her aortic valve.  She just turned 81 years of age but is functionally older.  Likely approachable with TAVR. There is mild to moderate mitral regurgitation.  There is significant calcium.  Not sure if this would be approachable with MitraClip. The blood pressure is stable for age and reflects the presence of aortic regurgitation.  There is a  wide pulse pressure. She has transmitted bruits into the neck.  Recent carotid Doppler revealed no significant obstruction.   Will get BNP level to see if there is evidence of cardiac stress related to the valvular disease.  We will schedule 2D Doppler echocardiogram for 4 to 5 months from now and arrange for her to have follow-up with a new primary cardiologist in 6 months.   Medication Adjustments/Labs and Tests Ordered: Current medicines are reviewed at length with the patient today.  Concerns regarding medicines are outlined above.  No orders of the defined types were placed in this encounter.  No orders of the defined types were placed in this encounter.   There are no Patient Instructions on file for this visit.   Signed, Lesleigh Noe, MD  07/24/2022 11:53 AM    [facility]

## 2022-07-24 ENCOUNTER — Ambulatory Visit: Payer: Medicare PPO | Attending: Interventional Cardiology | Admitting: Interventional Cardiology

## 2022-07-24 ENCOUNTER — Encounter: Payer: Self-pay | Admitting: Interventional Cardiology

## 2022-07-24 VITALS — BP 148/58 | HR 85 | Ht 60.0 in | Wt 110.4 lb

## 2022-07-24 DIAGNOSIS — I10 Essential (primary) hypertension: Secondary | ICD-10-CM | POA: Diagnosis not present

## 2022-07-24 DIAGNOSIS — I6523 Occlusion and stenosis of bilateral carotid arteries: Secondary | ICD-10-CM | POA: Diagnosis not present

## 2022-07-24 DIAGNOSIS — I350 Nonrheumatic aortic (valve) stenosis: Secondary | ICD-10-CM

## 2022-07-24 DIAGNOSIS — Z950 Presence of cardiac pacemaker: Secondary | ICD-10-CM

## 2022-07-24 NOTE — Patient Instructions (Signed)
Medication Instructions:  Your physician recommends that you continue on your current medications as directed. Please refer to the Current Medication list given to you today.  *If you need a refill on your cardiac medications before your next appointment, please call your pharmacy*  Lab Work: Lab: BNP If you have labs (blood work) drawn today and your tests are completely normal, you will receive your results only by: MyChart Message (if you have MyChart) OR A paper copy in the mail If you have any lab test that is abnormal or we need to change your treatment, we will call you to review the results.  Testing/Procedures: Your physician has requested that you have an echocardiogram in January 2024. Echocardiography is a painless test that uses sound waves to create images of your heart. It provides your doctor with information about the size and shape of your heart and how well your heart's chambers and valves are working. This procedure takes approximately one hour. There are no restrictions for this procedure.  Follow-Up: At [facility], you and your health needs are our priority.  As part of our continuing mission to provide you with exceptional heart care, we have created designated Provider Care Teams.  These Care Teams include your primary Cardiologist (physician) and Advanced Practice Providers (APPs -  Physician Assistants and Nurse Practitioners) who all work together to provide you with the care you need, when you need it.  Your next appointment:   6 month(s)  The format for your next appointment:   In Person  Provider:   Lesleigh Noe, MD   Important Information About Sugar

## 2022-08-16 ENCOUNTER — Ambulatory Visit (INDEPENDENT_AMBULATORY_CARE_PROVIDER_SITE_OTHER): Payer: Medicare HMO

## 2022-08-16 DIAGNOSIS — I442 Atrioventricular block, complete: Secondary | ICD-10-CM

## 2022-08-17 LAB — CUP PACEART REMOTE DEVICE CHECK
Date Time Interrogation Session: 20230921081029
Implantable Lead Implant Date: 20200316
Implantable Lead Implant Date: 20200316
Implantable Lead Location: 753859
Implantable Lead Location: 753860
Implantable Lead Model: 377
Implantable Lead Model: 377
Implantable Lead Serial Number: 81099636
Implantable Lead Serial Number: 81165051
Implantable Pulse Generator Implant Date: 20200316
Pulse Gen Model: 407145
Pulse Gen Serial Number: 69557134

## 2022-08-28 ENCOUNTER — Telehealth: Payer: Self-pay

## 2022-08-28 NOTE — Telephone Encounter (Signed)
The pt came into the office because her pacemaker moved. She states it is hurting her. I told her she can take some tylenol and put an Ice pack on it. She states she has advil. I told her she can take the advil. She made an appointment with device clinic for Wednesday the 4th. I let her know the nurse will give her a call this afternoon.

## 2022-08-28 NOTE — Telephone Encounter (Signed)
Attempted to f/u with patient. No answer, LMTCB.

## 2022-08-29 NOTE — Progress Notes (Signed)
Remote pacemaker transmission.   

## 2022-08-30 ENCOUNTER — Ambulatory Visit: Payer: Medicare PPO | Attending: Internal Medicine

## 2022-08-30 DIAGNOSIS — R55 Syncope and collapse: Secondary | ICD-10-CM

## 2022-08-30 DIAGNOSIS — I442 Atrioventricular block, complete: Secondary | ICD-10-CM

## 2022-08-30 NOTE — Progress Notes (Signed)
Device clinic appointment requested by Pt d/t she thought her pacemaker had moved.  Pacemaker interrogated and device working normally.  Pt reassured by nurse and Dr. Caryl Comes.

## 2022-09-14 DIAGNOSIS — M810 Age-related osteoporosis without current pathological fracture: Secondary | ICD-10-CM | POA: Diagnosis not present

## 2022-09-14 DIAGNOSIS — E1065 Type 1 diabetes mellitus with hyperglycemia: Secondary | ICD-10-CM | POA: Diagnosis not present

## 2022-09-14 DIAGNOSIS — I10 Essential (primary) hypertension: Secondary | ICD-10-CM | POA: Diagnosis not present

## 2022-09-14 DIAGNOSIS — E7800 Pure hypercholesterolemia, unspecified: Secondary | ICD-10-CM | POA: Diagnosis not present

## 2022-09-14 DIAGNOSIS — E162 Hypoglycemia, unspecified: Secondary | ICD-10-CM | POA: Diagnosis not present

## 2022-09-14 DIAGNOSIS — I639 Cerebral infarction, unspecified: Secondary | ICD-10-CM | POA: Diagnosis not present

## 2022-09-25 LAB — CUP PACEART INCLINIC DEVICE CHECK
Date Time Interrogation Session: 20231004170213
Implantable Lead Implant Date: 20200316
Implantable Lead Implant Date: 20200316
Implantable Lead Location: 753859
Implantable Lead Location: 753860
Implantable Lead Model: 377
Implantable Lead Model: 377
Implantable Lead Serial Number: 81099636
Implantable Lead Serial Number: 81165051
Implantable Pulse Generator Implant Date: 20200316
Pulse Gen Model: 407145
Pulse Gen Serial Number: 69557134

## 2022-11-15 ENCOUNTER — Ambulatory Visit (INDEPENDENT_AMBULATORY_CARE_PROVIDER_SITE_OTHER): Payer: Medicare PPO

## 2022-11-15 DIAGNOSIS — I442 Atrioventricular block, complete: Secondary | ICD-10-CM

## 2022-11-15 DIAGNOSIS — Z950 Presence of cardiac pacemaker: Secondary | ICD-10-CM

## 2022-11-15 LAB — CUP PACEART REMOTE DEVICE CHECK
Date Time Interrogation Session: 20231220075518
Implantable Lead Connection Status: 753985
Implantable Lead Connection Status: 753985
Implantable Lead Implant Date: 20200316
Implantable Lead Implant Date: 20200316
Implantable Lead Location: 753859
Implantable Lead Location: 753860
Implantable Lead Model: 377
Implantable Lead Model: 377
Implantable Lead Serial Number: 81099636
Implantable Lead Serial Number: 81165051
Implantable Pulse Generator Implant Date: 20200316
Pulse Gen Model: 407145
Pulse Gen Serial Number: 69557134

## 2022-12-06 ENCOUNTER — Ambulatory Visit (HOSPITAL_COMMUNITY): Payer: Medicare PPO | Attending: Interventional Cardiology

## 2022-12-06 DIAGNOSIS — I350 Nonrheumatic aortic (valve) stenosis: Secondary | ICD-10-CM

## 2022-12-06 LAB — ECHOCARDIOGRAM COMPLETE
AR max vel: 0.53 cm2
AV Area VTI: 0.5 cm2
AV Area mean vel: 0.54 cm2
AV Mean grad: 36.5 mmHg
AV Peak grad: 64 mmHg
Ao pk vel: 4 m/s
Area-P 1/2: 4.26 cm2
P 1/2 time: 365 msec
S' Lateral: 3.2 cm

## 2022-12-08 DIAGNOSIS — E559 Vitamin D deficiency, unspecified: Secondary | ICD-10-CM | POA: Diagnosis not present

## 2022-12-11 NOTE — Progress Notes (Signed)
Remote pacemaker transmission.   

## 2022-12-12 ENCOUNTER — Telehealth: Payer: Self-pay | Admitting: Interventional Cardiology

## 2022-12-12 NOTE — Telephone Encounter (Signed)
Pt called [facility] Triage following up with call from Dr. Tamala Julian RN  Pt had recent Echocardiogram and Dr. Tamala Julian wanted this reported to the Pt:  Let the patient know she has severe aortic stenosis and needs to see Dr. Estelle June, ASAP. A copy will be sent to [name], DO  Pt made aware of the Echo results, and the above communication.  Pt was sad to hear Dr. Tamala Julian had retired.  Pt open to any providers care at [facility] on [address] advised scheduling will reach out to her to schedule a follow up appointment.  Pt understood a new POC would be established with a new provider.

## 2022-12-12 NOTE — Telephone Encounter (Signed)
Follow Up:     Patient is returning Danielle Clarke's call from yesterday(12-11-22), concerning her Echo results.

## 2022-12-13 NOTE — Progress Notes (Signed)
Patient ID: REGINA GANCI MRN: [id number] DOB/AGE: 1940-12-05 82 y.o.  Primary Care Physician:[name], Kasandra Knudsen, MD Primary Cardiologist: Orbie Pyo   FOCUSED CARDIOVASCULAR PROBLEM LIST:   Severe aortic stenosis with AVA 0.5cm2, mean gradient of , and peak velocity of 4 m/s with EF 55-60%; EKG with SR without conduction defects Symptomatic bradycardia s/p pacemaker 2020; fall complicated by ICH Moderate mitral regurgitation Moderate carotid disease T2DM on insulin Hyperlipidemia Frailty, ambulates with a cane  HISTORY OF PRESENT ILLNESS: The patient is a 82 y.o. female with the indicated medical history here for recommendations regarding her aortic stenosis.  She had been followed by Dr. Katrinka Blazing for several years and her most recent echocardiogram demonstrated severe aortic stenosis.  She was noted to have developed some exertional fatigue at times when seen several months ago.  The patient is here with her daughter.  She tells me that over the last few years she has not been able to do as much as before.  However she feels like this is age-related.  She does not really experience any shortness of breath, presyncope, syncope, or chest pain.  She denies any orthopnea or paroxysmal nocturnal dyspnea.  She says she is able to do most of her activities of daily living but she is unsure whether she is slowing down or not.  Her daughter thinks she may be but has not been keeping track of this to closely.  When she was younger she was quite active and seem to be quite athletic as well.  She does not smoke.  She lives by herself but has family close by.  She recently had dental work to treat a dental infection and has a clear dental report per the patient.    Past Medical History:  Diagnosis Date   Aortic stenosis, mild    Arthritis    Asthmatic bronchitis    with colds per patient   Carotid artery disease (HCC)    40-59% bilateral ICA stenosis   Cataract    Cutaneous  abscess of right foot    Glaucoma    Heart murmur    Dr Katrinka Blazing is her  cardiologist.    Hyperlipemia    Hypertension    Dr. Katrinka Blazing ~ 2 years ago   Neck fracture [facility])    july 2013   Osteoporosis    Syncope    Type 1 diabetes mellitus [facility])     Past Surgical History:  Procedure Laterality Date   ANKLE FRACTURE SURGERY Left 2001   steel plate and 3 screws    ANTERIOR CERVICAL CORPECTOMY N/A 07/31/2014   Procedure: Cervical Four to Cervical Six Corpectomy;  Surgeon: Karn Cassis, MD;  Location: [facility] NEURO ORS;  Service: Neurosurgery;  Laterality: N/A;  C4 to C6 Corpectomy   BREAST SURGERY  1988   CATARACT EXTRACTION W/ INTRAOCULAR LENS  IMPLANT, BILATERAL Bilateral 2011   right and then left   EYE SURGERY     FRACTURE SURGERY     HAMMER TOE SURGERY  1998   I & D EXTREMITY Right 09/27/2018   Procedure: IRRIGATION AND DEBRIDEMENT RIGHT FOOT;  Surgeon: Nadara Mustard, MD;  Location: [facility] OR;  Service: Orthopedics;  Laterality: Right;   JOINT REPLACEMENT     PACEMAKER IMPLANT N/A 02/10/2019   Procedure: PACEMAKER IMPLANT;  Surgeon: Marinus Maw, MD;  Location: [facility] INVASIVE CV LAB;  Service: Cardiovascular;  Laterality: N/A;   TONSILLECTOMY AND ADENOIDECTOMY  1948   TOTAL SHOULDER REPLACEMENT  2010   right shoulder    TUBAL LIGATION  1979   TYMPANOSTOMY TUBE PLACEMENT Bilateral     Family History  Problem Relation Age of Onset   Cancer Mother    Heart Problems Father     Social History   Socioeconomic History   Marital status: Divorced    Spouse name: Not on file   Number of children: 2   Years of education: [employer]   Highest education level: Not on file  Occupational History   Occupation: Retired    Fish farm manager: RETIRED  Tobacco Use   Smoking status: Never   Smokeless tobacco: Never  Vaping Use   Vaping Use: Never used  Substance and Sexual Activity   Alcohol use: Yes    Alcohol/week: 2.0 standard drinks of alcohol    Types: 2 Glasses of wine per week   Drug use:  Never   Sexual activity: Never  Other Topics Concern   Not on file  Social History Narrative   Patient lives at home alone.   Caffeine Use:    Social Determinants of Health   Financial Resource Strain: Not on file  Food Insecurity: Not on file  Transportation Needs: Not on file  Physical Activity: Not on file  Stress: Not on file  Social Connections: Not on file  Intimate Partner Violence: Not on file     Prior to Admission medications   Medication Sig Start Date End Date Taking? Authorizing Provider  aspirin 81 MG chewable tablet Chew 1 tablet by mouth daily.    [provider]  Camphor-Menthol-Methyl Sal (SALONPAS EX) Apply 1 patch topically daily as needed (pain).    [provider]  Cyanocobalamin (VITAMIN B-12 PO) Take 1 tablet by mouth daily.    [provider]  HUMALOG KWIKPEN 100 UNIT/ML KwikPen Inject 0-6 Units into the skin 3 (three) times daily as needed (high blood sugar). BS  80-150=2 units 151-200=3 units 201-250=4 units 251-300=5 units 301-350=6 units 351-400=7 units 401-450=8 units 04/08/19   [provider]  ibuprofen (ADVIL) 200 MG tablet Take 200 mg by mouth 2 (two) times daily.    [provider]  Insulin Glargine (LANTUS) 100 UNIT/ML Solostar Pen Inject 13 Units into the skin daily. Patient taking differently: Inject 12 Units into the skin daily. 02/28/19   Angiulli, Lavon Paganini, PA-C  lidocaine (LIDODERM) 5 % Place 1 patch onto the skin daily. Remove & Discard patch within 12 hours or as directed by MD 11/25/21   Charlynne Cousins, MD  loratadine (CLARITIN) 10 MG tablet Take 1 tablet (10 mg total) by mouth daily. 10/17/20   Rai, Ripudeep K, MD  montelukast (SINGULAIR) 10 MG tablet Take 10 mg by mouth at bedtime as needed. allergies 06/27/22   [provider]  Multiple Vitamin (MULTIVITAMIN WITH MINERALS) TABS tablet Take 1 tablet by mouth daily.    [provider]  oxyCODONE-acetaminophen  (PERCOCET/ROXICET) 5-325 MG tablet Take 1-2 tablets by mouth every 4 (four) hours as needed for severe pain. 11/25/21   Charlynne Cousins, MD  polyvinyl alcohol (LIQUIFILM TEARS) 1.4 % ophthalmic solution Place 1 drop into both eyes as needed for dry eyes.    [provider]  simvastatin (ZOCOR) 40 MG tablet Take 40 mg by mouth daily. 03/20/20   [provider]  traMADol (ULTRAM) 50 MG tablet Take 1 tablet by mouth every 6 (six) hours as needed for pain.    [provider]  VITAMIN D PO Take 1  tablet by mouth daily.    [provider]    Allergies  Allergen Reactions   Cefaclor Anaphylaxis    Tolerated cephalexin in 2018   Tetracycline Anaphylaxis   Vancomycin Anaphylaxis   Augmentin [Amoxicillin-Pot Clavulanate] Other (See Comments)    Reaction unknown >> Anaphylaxis Has patient had a PCN reaction causing immediate rash, facial/tongue/throat swelling, SOB or lightheadedness with hypotension: Unknown Has patient had a PCN reaction causing severe rash involving mucus membranes or skin necrosis: Unknown Has patient had a PCN reaction that required hospitalization: Unknown Has patient had a PCN reaction occurring within the last 10 years: Unknown If all of the above answers are "NO", then may proceed with Cephalosporin use.    Nsaids     Other reaction(s): CKD   Quinapril Hcl     Other reaction(s): elevated potassium   Sulfamethoxazole-Trimethoprim     Other reaction(s): decreased kidney function   Prednisone Other (See Comments)    Reaction unknown    REVIEW OF SYSTEMS:  General: no fevers/chills/night sweats Eyes: no blurry vision, diplopia, or amaurosis ENT: no sore throat or hearing loss Resp: no cough, wheezing, or hemoptysis CV: no edema or palpitations GI: no abdominal pain, nausea, vomiting, diarrhea, or constipation GU: no dysuria, frequency, or hematuria Skin: no rash Neuro: no headache, numbness, tingling, or weakness of  extremities Musculoskeletal: no joint pain or swelling Heme: no bleeding, DVT, or easy bruising Endo: no polydipsia or polyuria  BP 116/70   Pulse 81   Ht 5' (1.524 m)   Wt 107 lb 3.2 oz (48.6 kg)   SpO2 93%   BMI 20.94 kg/m   PHYSICAL EXAM: GEN:  AO x 3 in no acute distress HEENT: normal Dentition: Normal Neck: JVP normal. +2 carotid upstrokes without bruits. No thyromegaly. Lungs: equal expansion, clear bilaterally CV: Apex is discrete and nondisplaced, RRR with 3 out of 6 systolic murmur Abd: soft, non-tender, non-distended; no bruit; positive bowel sounds Ext: no edema, ecchymoses, or cyanosis Vascular: 2+ femoral pulses, 2+ radial pulses       Skin: warm and dry without rash Neuro: CN II-XII grossly intact; motor and sensory grossly intact    DATA AND STUDIES:  EKG: EKG performed today that I reviewed demonstrates normal sinus rhythm with sinus arrhythmia; a second EKG today demonstrates atrial pacing  2D ECHO: 2024  1. Left ventricular ejection fraction, by estimation, is 55 to 60%. The  left ventricle has normal function. The left ventricle has no regional  wall motion abnormalities. There is mild left ventricular hypertrophy.  Left ventricular diastolic parameters  are consistent with Grade I diastolic dysfunction (impaired relaxation).  Elevated left ventricular end-diastolic pressure.   2. Right ventricular systolic function is normal. The right ventricular  size is normal. There is mildly elevated pulmonary artery systolic  pressure. The estimated right ventricular systolic pressure is 17.6 mmHg.   3. The mitral valve is abnormal. Moderate mitral valve regurgitation. No  evidence of mitral stenosis. Moderate mitral annular calcification.   4. The aortic valve is calcified. Aortic valve regurgitation is moderate.  Severe aortic valve stenosis. Aortic regurgitation PHT measures 365 msec.  Aortic valve area, by VTI measures 0.50 cm. Aortic valve mean gradient   measures 36.5 mmHg. Aortic valve  Vmax measures 4.00 m/s. Peak gradient 64 mmHg. DI is 0.20.   5. The inferior vena cava is normal in size with greater than 50%  respiratory variability, suggesting right atrial pressure of 3 mmHg.   CARDIAC CATH: n/a  CAROTID DOPPLERS:  2023 Right Carotid: Velocities in the right ICA are consistent with a 1-39% stenosis. Non-hemodynamically significant plaque <50% noted in the CCA.   Left Carotid: Velocities in the left ICA are consistent with a 1-39% stenosis.  Non-hemodynamically significant plaque <50% noted in the CCA. The  ECA appears >50% stenosed.   Vertebrals:  Bilateral vertebral arteries demonstrate antegrade flow.   Subclavians: Normal flow hemodynamics were seen in bilateral subclavian arteries.   STS RISK CALCULATOR: pending  NHYA CLASS: 2    ASSESSMENT AND PLAN:   Aortic valve stenosis, etiology of cardiac valve disease unspecified - Plan: EKG 12-Lead, ECHOCARDIOGRAM COMPLETE  Cardiac pacemaker in situ  Bilateral carotid artery stenosis  Type 2 diabetes mellitus without complication, with long-term current use of insulin (HCC)  Hyperlipidemia associated with type 2 diabetes mellitus (HCC)  My sense is that the patient is slowing down likely attributable to her aortic stenosis.  He however feels quite well.  This may be because she does not exert herself to a great degree as limited by using a cane.  She fortunately is not having any presyncope or syncope.  After long discussion we have elected to monitor her symptoms for now.  I will see her back in 3 weeks with an echocardiogram.  I told her to contact our program if she were develop any increasing shortness of breath, fatigue, chest pain, presyncope or syncope.  She sees a dentist on a regular basis and had a tooth extracted for an infection and no longer needs any dental extractions done.  If she were to require a TAVR procedure given her pacemaker implantation, it would be  reasonable to consider a [employer] self-expanding valve.  I have personally reviewed the patients imaging data as summarized above.  I have reviewed the natural history of aortic stenosis with the patient and family members who are present today. We have discussed the limitations of medical therapy and the poor prognosis associated with symptomatic aortic stenosis. We have also reviewed potential treatment options, including palliative medical therapy, conventional surgical aortic valve replacement, and transcatheter aortic valve replacement. We discussed treatment options in the context of this patient's specific comorbid medical conditions.   Given her history of diabetes I will restart aspirin 81 mg daily.  All of the patient's questions were answered today. Will make further recommendations based on the results of studies outlined above.   Total time spent with patient today 60 minutes. This includes reviewing records, evaluating the patient and coordinating care.   Orbie Pyo, MD  12/14/2022 4:19 PM    [facility] [address] [place], [place], [place]  [address] Phone: [phone]; Fax: [phone]

## 2022-12-14 ENCOUNTER — Ambulatory Visit: Payer: Medicare PPO | Attending: Internal Medicine | Admitting: Internal Medicine

## 2022-12-14 ENCOUNTER — Encounter: Payer: Self-pay | Admitting: Internal Medicine

## 2022-12-14 VITALS — BP 116/70 | HR 81 | Ht 60.0 in | Wt 107.2 lb

## 2022-12-14 DIAGNOSIS — E785 Hyperlipidemia, unspecified: Secondary | ICD-10-CM

## 2022-12-14 DIAGNOSIS — E1169 Type 2 diabetes mellitus with other specified complication: Secondary | ICD-10-CM

## 2022-12-14 DIAGNOSIS — Z794 Long term (current) use of insulin: Secondary | ICD-10-CM

## 2022-12-14 DIAGNOSIS — I350 Nonrheumatic aortic (valve) stenosis: Secondary | ICD-10-CM

## 2022-12-14 DIAGNOSIS — E119 Type 2 diabetes mellitus without complications: Secondary | ICD-10-CM

## 2022-12-14 DIAGNOSIS — I6523 Occlusion and stenosis of bilateral carotid arteries: Secondary | ICD-10-CM

## 2022-12-14 DIAGNOSIS — Z950 Presence of cardiac pacemaker: Secondary | ICD-10-CM | POA: Diagnosis not present

## 2022-12-14 MED ORDER — ASPIRIN 81 MG PO TBEC: 81.0000 mg | DELAYED_RELEASE_TABLET | Freq: Every day | ORAL | 12 refills | Status: DC

## 2022-12-14 NOTE — Patient Instructions (Signed)
Medication Instructions:  Your physician has recommended you make the following change in your medication:  START: Aspirin 81 mg by mouth once daily  *If you need a refill on your cardiac medications before your next appointment, please call your pharmacy*   Lab Work: NONE If you have labs (blood work) drawn today and your tests are completely normal, you will receive your results only by: [place] (if you have MyChart) OR A paper copy in the mail If you have any lab test that is abnormal or we need to change your treatment, we will call you to review the results.   Testing/Procedures: IN 3 MONTHS: Your physician has requested that you have an echocardiogram. Echocardiography is a painless test that uses sound waves to create images of your heart. It provides your doctor with information about the size and shape of your heart and how well your heart's chambers and valves are working. This procedure takes approximately one hour. There are no restrictions for this procedure. Please do NOT wear cologne, perfume, aftershave, or lotions (deodorant is allowed). Please arrive 15 minutes prior to your appointment time.    Follow-Up: At [facility], you and your health needs are our priority.  As part of our continuing mission to provide you with exceptional heart care, we have created designated Provider Care Teams.  These Care Teams include your primary Cardiologist (physician) and Advanced Practice Providers (APPs -  Physician Assistants and Nurse Practitioners) who all work together to provide you with the care you need, when you need it.  We recommend signing up for the patient portal called "MyChart".  Sign up information is provided on this After Visit Summary.  MyChart is used to connect with patients for Virtual Visits (Telemedicine).  Patients are able to view lab/test results, encounter notes, upcoming appointments, etc.  Non-urgent messages can be sent to your provider as  well.   To learn more about what you can do with MyChart, go to [email or web address].    Your next appointment:   3 month(s)  Provider:   Early Osmond, MD

## 2022-12-28 DIAGNOSIS — Z950 Presence of cardiac pacemaker: Secondary | ICD-10-CM | POA: Diagnosis not present

## 2022-12-28 DIAGNOSIS — E46 Unspecified protein-calorie malnutrition: Secondary | ICD-10-CM | POA: Diagnosis not present

## 2022-12-28 DIAGNOSIS — E7800 Pure hypercholesterolemia, unspecified: Secondary | ICD-10-CM | POA: Diagnosis not present

## 2022-12-28 DIAGNOSIS — E109 Type 1 diabetes mellitus without complications: Secondary | ICD-10-CM | POA: Diagnosis not present

## 2022-12-28 DIAGNOSIS — I350 Nonrheumatic aortic (valve) stenosis: Secondary | ICD-10-CM | POA: Diagnosis not present

## 2022-12-28 NOTE — Progress Notes (Signed)
[facility]                                     Cardiology Office Note:    Date:  01/01/2023   ID:  Danielle Clarke, DOB 11-04-1941, MRN [id number]  PCP:  Mckinley Jewel, MD  [facility] Cardiologist:  Early Osmond, MD  [facility] Electrophysiologist: Dr. Lovena Le, MD    Referring MD: Mckinley Jewel, MD   Chief Complaint  Patient presents with   Follow-up    Pre TAVR workup    History of Present Illness:    Danielle Clarke is a 82 y.o. female with a hx of symptomatic bradycardia s/p PPM placement 2020, ICH secondary to fall, moderate MR, DM2 on insulin, HLD, and severe aortic stenosis who was referred to the Structural Heart team for further AS workup with TAVR.   Danielle Clarke was seen recently by Dr. Ali Lowe on 12/14/22 in TAVR consultation. She has been followed by Dr. Tamala Julian for many years with her most recent echocardiogram demonstrated severe aortic stenosis. She was noted to have developed some exertional fatigue several months ago however felt quite well during her consult and wished to monitor her symptoms and contact our team if she wished to proceed sooner.   She is being seen today to further discuss TAVR workup and our typical workflow and to also discuss cardiac catheterization. She is here with her neighbor/friend who also acts as a caregiver to the patient. The patient comes with many questions and gets easily off task during our discussion regarding the normal workup process with TAVR. Her daughter is planning a trip to [place] in April and wishes that her mom not undergo the procedure while she is out of town. Danielle Clarke therefore reached to our team to start the TAVR process. Cardiac cath followed by CT imaging and surgeon visit was explained in depth. She is willing to proceed. She denies chest pain, palpitations, LE edema, SOB, dizziness, or syncope. She has some generalized fatigue and a cough. She denies  fevers or body aches.  Past Medical History:  Diagnosis Date   Aortic stenosis, mild    Arthritis    Asthmatic bronchitis    with colds per patient   Carotid artery disease (HCC)    40-59% bilateral ICA stenosis   Cataract    Cutaneous abscess of right foot    Glaucoma    Heart murmur    Dr Tamala Julian is her  cardiologist.    Hyperlipemia    Hypertension    Dr. Tamala Julian ~ 2 years ago   Neck fracture [facility])    july 2013   Osteoporosis    Syncope    Type 1 diabetes mellitus [facility])     Past Surgical History:  Procedure Laterality Date   ANKLE FRACTURE SURGERY Left 2001   steel plate and 3 screws    ANTERIOR CERVICAL CORPECTOMY N/A 07/31/2014   Procedure: Cervical Four to Cervical Six Corpectomy;  Surgeon: Floyce Stakes, MD;  Location: [facility];  Service: Neurosurgery;  Laterality: N/A;  C4 to C6 Corpectomy   BREAST SURGERY  1988   CATARACT EXTRACTION W/ INTRAOCULAR LENS  IMPLANT, BILATERAL Bilateral 2011   right and then left   [place]  I & D EXTREMITY Right 09/27/2018   Procedure: IRRIGATION AND DEBRIDEMENT RIGHT FOOT;  Surgeon: Newt Minion, MD;  Location: [facility];  Service: Orthopedics;  Laterality: Right;   JOINT REPLACEMENT     PACEMAKER IMPLANT N/A 02/10/2019   Procedure: PACEMAKER IMPLANT;  Surgeon: Evans Lance, MD;  Location: [facility];  Service: Cardiovascular;  Laterality: N/A;   TONSILLECTOMY AND ADENOIDECTOMY  1948   TOTAL SHOULDER REPLACEMENT  2010   right shoulder    TUBAL LIGATION  1979   TYMPANOSTOMY TUBE PLACEMENT Bilateral     Current Medications: Current Meds  Medication Sig   aspirin EC 81 MG tablet Take 1 tablet (81 mg total) by mouth daily. Swallow whole.   Camphor-Menthol-Methyl Sal (SALONPAS EX) Apply 1 patch topically daily as needed (pain).   Cyanocobalamin (VITAMIN B-12 PO) Take 1 tablet by mouth daily.   HUMALOG KWIKPEN 100 UNIT/ML KwikPen Inject 0-6 Units into the skin 3  (three) times daily as needed (high blood sugar). BS  80-150=2 units 151-200=3 units 201-250=4 units 251-300=5 units 301-350=6 units 351-400=7 units 401-450=8 units   ibuprofen (ADVIL) 200 MG tablet Take 200 mg by mouth 2 (two) times daily.   insulin glargine (LANTUS SOLOSTAR) 100 UNIT/ML Solostar Pen Inject 10 Units into the skin daily.   lidocaine (LIDODERM) 5 % Place 1 patch onto the skin daily. Remove & Discard patch within 12 hours or as directed by MD   montelukast (SINGULAIR) 10 MG tablet Take 10 mg by mouth at bedtime as needed. allergies   polyvinyl alcohol (LIQUIFILM TEARS) 1.4 % ophthalmic solution Place 1 drop into both eyes as needed for dry eyes.   simvastatin (ZOCOR) 40 MG tablet Take 40 mg by mouth daily.   VITAMIN D PO Take 1 tablet by mouth daily.     Allergies:   Cefaclor, Tetracycline, Vancomycin, Augmentin [amoxicillin-pot clavulanate], Nsaids, Quinapril hcl, Sulfamethoxazole-trimethoprim, and Prednisone   Social History   Socioeconomic History   Marital status: Divorced    Spouse name: Not on file   Number of children: 2   Years of education: [employer]   Highest education level: Not on file  Occupational History   Occupation: Retired    Fish farm manager: RETIRED  Tobacco Use   Smoking status: Never   Smokeless tobacco: Never  Vaping Use   Vaping Use: Never used  Substance and Sexual Activity   Alcohol use: Yes    Alcohol/week: 2.0 standard drinks of alcohol    Types: 2 Glasses of wine per week   Drug use: Never   Sexual activity: Never  Other Topics Concern   Not on file  Social History Narrative   Patient lives at home alone.   Caffeine Use:    Social Determinants of Health   Financial Resource Strain: Not on file  Food Insecurity: Not on file  Transportation Needs: Not on file  Physical Activity: Not on file  Stress: Not on file  Social Connections: Not on file    Family History: The patient's family history includes Cancer in her mother;  Heart Problems in her father.  ROS:   Please see the history of present illness.    All other systems reviewed and are negative.  EKGs/Labs/Other Studies Reviewed:    The following studies were reviewed today:  Echocardiogram 12/14/2022:  1. Left ventricular ejection fraction, by estimation, is 55 to 60%. The  left ventricle has normal function. The left ventricle has no regional  wall motion abnormalities. There is  mild left ventricular hypertrophy.  Left ventricular diastolic parameters  are consistent with Grade I diastolic dysfunction (impaired relaxation).  Elevated left ventricular end-diastolic pressure.   2. Right ventricular systolic function is normal. The right ventricular  size is normal. There is mildly elevated pulmonary artery systolic  pressure. The estimated right ventricular systolic pressure is 0000000 mmHg.   3. The mitral valve is abnormal. Moderate mitral valve regurgitation. No  evidence of mitral stenosis. Moderate mitral annular calcification.   4. The aortic valve is calcified. Aortic valve regurgitation is moderate.  Severe aortic valve stenosis. Aortic regurgitation PHT measures 365 msec.  Aortic valve area, by VTI measures 0.50 cm. Aortic valve mean gradient  measures 36.5 mmHg. Aortic valve  Vmax measures 4.00 m/s. Peak gradient 64 mmHg. DI is 0.20.   5. The inferior vena cava is normal in size with greater than 50%  respiratory variability, suggesting right atrial pressure of 3 mmHg.    Carotid dopplers:  04/07/2022 Right Carotid: Velocities in the right ICA are consistent with a 1-39% stenosis. Non-hemodynamically significant plaque <50% noted in the CCA.   Left Carotid: Velocities in the left ICA are consistent with a 1-39% stenosis.  Non-hemodynamically significant plaque <50% noted in the CCA. The  ECA appears >50% stenosed.   Vertebrals:  Bilateral vertebral arteries demonstrate antegrade flow.    Subclavians: Normal flow hemodynamics were seen in  bilateral subclavian arteries.   EKG:  EKG is not ordered today.  Last EKG 1/18  Recent Labs: No results found for requested labs within last 365 days.   Recent Lipid Panel No results found for: "CHOL", "TRIG", "HDL", "CHOLHDL", "VLDL", "LDLCALC", "LDLDIRECT"  Physical Exam:    VS:  BP 128/82   Pulse 87   Ht 5' (1.524 m)   Wt 104 lb (47.2 kg)   SpO2 96%   BMI 20.31 kg/m     Wt Readings from Last 3 Encounters:  01/01/23 104 lb (47.2 kg)  12/14/22 107 lb 3.2 oz (48.6 kg)  07/24/22 110 lb 6.4 oz (50.1 kg)    General: Frial, NAD Lungs:Clear to ausculation bilaterally. No wheezes, rales, or rhonchi. Breathing is unlabored. Cardiovascular: RRR with S1 S2. + systolic murmur Extremities: No edema.  Neuro: Alert and oriented. No focal deficits. No facial asymmetry. MAE spontaneously. Psych: Responds to questions appropriately with normal affect.    ASSESSMENT/PLAN:    Severe aortic stenosis: Seen in [facility] consultation by Dr. Ali Lowe with initial plans to follow her progression with repeat echo and OV however patient reports after further thinking, she wished to proceed with TAVR workup. Continues to do well wit NYHA class I symptoms. Lengthy conversion with the patient and care giver today and the patient would like to move forward with cardiac catheterization. This will be followed by CT imaging and surgeon consultation. Obtain CBC, BMET today. Scheduled for cath 12/11/22.   Cardiac catheterization was discussed with the patient fully. The patient understands that risks include but are not limited to stroke (1 in 1000), death (1 in 1), kidney failure [usually temporary] (1 in 500), bleeding (1 in 200), allergic reaction [possibly serious] (1 in 200).  The patient understands and is willing to proceed.     Bradycardia s/p PPM placement 2020: No issues. Last interrogation 12/23 with normal device function and battery life. No changes.   ICH secondary to fall: No recent falls. Walks with  four prong cane for stability.   Moderate MR: Continue to follow with surveillance imaging. Denies symptoms.  DM1: Dx at 82yo. Follows with endocrinology. Continue current regimen.   Shared Decision Making/Informed Consent The risks [stroke (1 in 1000), death (1 in 1000), kidney failure [usually temporary] (1 in 500), bleeding (1 in 200), allergic reaction [possibly serious] (1 in 200)], benefits (diagnostic support and management of coronary artery disease) and alternatives of a cardiac catheterization were discussed in detail with Ms. Mcgannon and she is willing to proceed.    Medication Adjustments/Labs and Tests Ordered: Current medicines are reviewed at length with the patient today.  Concerns regarding medicines are outlined above.  Orders Placed This Encounter  Procedures   Basic metabolic panel   CBC   No orders of the defined types were placed in this encounter.   Patient Instructions  Medication Instructions:  Your physician recommends that you continue on your current medications as directed. Please refer to the Current Medication list given to you today.  *If you need a refill on your cardiac medications before your next appointment, please call your pharmacy*   Lab Work: TODAY: BMET, CBC If you have labs (blood work) drawn today and your tests are completely normal, you will receive your results only by: [facility] (if you have MyChart) OR A paper copy in the mail If you have any lab test that is abnormal or we need to change your treatment, we will call you to review the results.  Testing/Procedures:       Cardiac/Peripheral Catheterization   You are scheduled for a Cardiac Catheterization on Thursday, February 15 with Dr. Lenna Sciara.  1. Please arrive at the Main Entrance A at [facility]: [place], [place] [address] on February 15 at 7:00 AM (This time is two hours before your procedure to ensure your preparation). Free valet  parking service is available. You will check in at ADMITTING. The support person will be asked to wait in the waiting room.  It is OK to have someone drop you off and come back when you are ready to be discharged.        Special note: Every effort is made to have your procedure done on time. Please understand that emergencies sometimes delay scheduled procedures.   . 2. Diet: Do not eat solid foods after midnight.  You may have clear liquids until 5 AM the day of the procedure.  3. Labs: TODAY  4. Medication instructions in preparation for your procedure:   Contrast Allergy: No  Take only 5 units of insulin the night before your procedure. Do not take any insulin on the day of the procedure.  Take only your HUMALOG (SLIDING SCALE) the night before your procedure. Do not take any insulin on the day of the procedure.  On the morning of your procedure, take Aspirin 81 mg and any morning medicines NOT listed above.  You may use sips of water.  5. Plan to go home the same day, you will only stay overnight if medically necessary. 6. You MUST have a responsible adult to drive you home. 7. An adult MUST be with you the first 24 hours after you arrive home. 8. Bring a current list of your medications, and the last time and date medication taken. 9. Bring ID and current insurance cards. 10.Please wear clothes that are easy to get on and off and wear slip-on shoes.  Thank you for allowing [place] to care for you!   -- [facility]   Follow-Up: At [facility], you and  your health needs are our priority.  As part of our continuing mission to provide you with exceptional heart care, we have created designated Provider Care Teams.  These Care Teams include your primary Cardiologist (physician) and Advanced Practice Providers (APPs -  Physician Assistants and Nurse Practitioners) who all work together to provide you with the care you need, when you need it.  We  recommend signing up for the patient portal called "MyChart".  Sign up information is provided on this After Visit Summary.  MyChart is used to connect with patients for Virtual Visits (Telemedicine).  Patients are able to view lab/test results, encounter notes, upcoming appointments, etc.  Non-urgent messages can be sent to your provider as well.   To learn more about what you can do with MyChart, go to [email or web address].    Your next appointment:   KEEP YOUR SCHEDULED FOLLOW-UP   Signed, Kathyrn Drown, NP  01/01/2023 12:39 PM    [facility]

## 2022-12-28 NOTE — H&P (View-Only) (Signed)
[facility]                                     Cardiology Office Note:    Date:  01/01/2023   ID:  Danielle Clarke, DOB 11-04-1941, MRN [id number]  PCP:  Mckinley Jewel, MD  [facility] Cardiologist:  Early Osmond, MD  [facility] Electrophysiologist: Dr. Lovena Le, MD    Referring MD: Mckinley Jewel, MD   Chief Complaint  Patient presents with   Follow-up    Pre TAVR workup    History of Present Illness:    Danielle Clarke is a 82 y.o. female with a hx of symptomatic bradycardia s/p PPM placement 2020, ICH secondary to fall, moderate MR, DM2 on insulin, HLD, and severe aortic stenosis who was referred to the Structural Heart team for further AS workup with TAVR.   Danielle Clarke was seen recently by Dr. Ali Lowe on 12/14/22 in TAVR consultation. She has been followed by Dr. Tamala Julian for many years with her most recent echocardiogram demonstrated severe aortic stenosis. She was noted to have developed some exertional fatigue several months ago however felt quite well during her consult and wished to monitor her symptoms and contact our team if she wished to proceed sooner.   She is being seen today to further discuss TAVR workup and our typical workflow and to also discuss cardiac catheterization. She is here with her neighbor/friend who also acts as a caregiver to the patient. The patient comes with many questions and gets easily off task during our discussion regarding the normal workup process with TAVR. Her daughter is planning a trip to [place] in April and wishes that her mom not undergo the procedure while she is out of town. Danielle Clarke therefore reached to our team to start the TAVR process. Cardiac cath followed by CT imaging and surgeon visit was explained in depth. She is willing to proceed. She denies chest pain, palpitations, LE edema, SOB, dizziness, or syncope. She has some generalized fatigue and a cough. She denies  fevers or body aches.  Past Medical History:  Diagnosis Date   Aortic stenosis, mild    Arthritis    Asthmatic bronchitis    with colds per patient   Carotid artery disease (HCC)    40-59% bilateral ICA stenosis   Cataract    Cutaneous abscess of right foot    Glaucoma    Heart murmur    Dr Tamala Julian is her  cardiologist.    Hyperlipemia    Hypertension    Dr. Tamala Julian ~ 2 years ago   Neck fracture [facility])    july 2013   Osteoporosis    Syncope    Type 1 diabetes mellitus [facility])     Past Surgical History:  Procedure Laterality Date   ANKLE FRACTURE SURGERY Left 2001   steel plate and 3 screws    ANTERIOR CERVICAL CORPECTOMY N/A 07/31/2014   Procedure: Cervical Four to Cervical Six Corpectomy;  Surgeon: Floyce Stakes, MD;  Location: [facility] NEURO OR[facility];  Service: Neurosurgery;  Laterality: N/A;  C4 to C6 Corpectomy   BREAST SURGERY  1988   CATARACT EXTRACTION W/ INTRAOCULAR LENS  IMPLANT, BILATERAL Bilateral 2011   right and then left   [place]  I & D EXTREMITY Right 09/27/2018   Procedure: IRRIGATION AND DEBRIDEMENT RIGHT FOOT;  Surgeon: Newt Minion, MD;  Location: [facility];  Service: Orthopedics;  Laterality: Right;   JOINT REPLACEMENT     PACEMAKER IMPLANT N/A 02/10/2019   Procedure: PACEMAKER IMPLANT;  Surgeon: Evans Lance, MD;  Location: [facility] CV LAB;  Service: Cardiovascular;  Laterality: N/A;   TONSILLECTOMY AND ADENOIDECTOMY  1948   TOTAL SHOULDER REPLACEMENT  2010   right shoulder    TUBAL LIGATION  1979   TYMPANOSTOMY TUBE PLACEMENT Bilateral     Current Medications: Current Meds  Medication Sig   aspirin EC 81 MG tablet Take 1 tablet (81 mg total) by mouth daily. Swallow whole.   Camphor-Menthol-Methyl Sal (SALONPAS EX) Apply 1 patch topically daily as needed (pain).   Cyanocobalamin (VITAMIN B-12 PO) Take 1 tablet by mouth daily.   HUMALOG KWIKPEN 100 UNIT/ML KwikPen Inject 0-6 Units into the skin 3  (three) times daily as needed (high blood sugar). BS  80-150=2 units 151-200=3 units 201-250=4 units 251-300=5 units 301-350=6 units 351-400=7 units 401-450=8 units   ibuprofen (ADVIL) 200 MG tablet Take 200 mg by mouth 2 (two) times daily.   insulin glargine (LANTUS SOLOSTAR) 100 UNIT/ML Solostar Pen Inject 10 Units into the skin daily.   lidocaine (LIDODERM) 5 % Place 1 patch onto the skin daily. Remove & Discard patch within 12 hours or as directed by MD   montelukast (SINGULAIR) 10 MG tablet Take 10 mg by mouth at bedtime as needed. allergies   polyvinyl alcohol (LIQUIFILM TEARS) 1.4 % ophthalmic solution Place 1 drop into both eyes as needed for dry eyes.   simvastatin (ZOCOR) 40 MG tablet Take 40 mg by mouth daily.   VITAMIN D PO Take 1 tablet by mouth daily.     Allergies:   Cefaclor, Tetracycline, Vancomycin, Augmentin [amoxicillin-pot clavulanate], Nsaids, Quinapril hcl, Sulfamethoxazole-trimethoprim, and Prednisone   Social History   Socioeconomic History   Marital status: Divorced    Spouse name: Not on file   Number of children: 2   Years of education: [employer]   Highest education level: Not on file  Occupational History   Occupation: Retired    Fish farm manager: RETIRED  Tobacco Use   Smoking status: Never   Smokeless tobacco: Never  Vaping Use   Vaping Use: Never used  Substance and Sexual Activity   Alcohol use: Yes    Alcohol/week: 2.0 standard drinks of alcohol    Types: 2 Glasses of wine per week   Drug use: Never   Sexual activity: Never  Other Topics Concern   Not on file  Social History Narrative   Patient lives at home alone.   Caffeine Use:    Social Determinants of Health   Financial Resource Strain: Not on file  Food Insecurity: Not on file  Transportation Needs: Not on file  Physical Activity: Not on file  Stress: Not on file  Social Connections: Not on file    Family History: The patient's family history includes Cancer in her mother;  Heart Problems in her father.  ROS:   Please see the history of present illness.    All other systems reviewed and are negative.  EKGs/Labs/Other Studies Reviewed:    The following studies were reviewed today:  Echocardiogram 12/14/2022:  1. Left ventricular ejection fraction, by estimation, is 55 to 60%. The  left ventricle has normal function. The left ventricle has no regional  wall motion abnormalities. There is  mild left ventricular hypertrophy.  Left ventricular diastolic parameters  are consistent with Grade I diastolic dysfunction (impaired relaxation).  Elevated left ventricular end-diastolic pressure.   2. Right ventricular systolic function is normal. The right ventricular  size is normal. There is mildly elevated pulmonary artery systolic  pressure. The estimated right ventricular systolic pressure is 0000000 mmHg.   3. The mitral valve is abnormal. Moderate mitral valve regurgitation. No  evidence of mitral stenosis. Moderate mitral annular calcification.   4. The aortic valve is calcified. Aortic valve regurgitation is moderate.  Severe aortic valve stenosis. Aortic regurgitation PHT measures 365 msec.  Aortic valve area, by VTI measures 0.50 cm. Aortic valve mean gradient  measures 36.5 mmHg. Aortic valve  Vmax measures 4.00 m/s. Peak gradient 64 mmHg. DI is 0.20.   5. The inferior vena cava is normal in size with greater than 50%  respiratory variability, suggesting right atrial pressure of 3 mmHg.    Carotid dopplers:  04/07/2022 Right Carotid: Velocities in the right ICA are consistent with a 1-39% stenosis. Non-hemodynamically significant plaque <50% noted in the CCA.   Left Carotid: Velocities in the left ICA are consistent with a 1-39% stenosis.  Non-hemodynamically significant plaque <50% noted in the CCA. The  ECA appears >50% stenosed.   Vertebrals:  Bilateral vertebral arteries demonstrate antegrade flow.    Subclavians: Normal flow hemodynamics were seen in  bilateral subclavian arteries.   EKG:  EKG is not ordered today.  Last EKG 1/18  Recent Labs: No results found for requested labs within last 365 days.   Recent Lipid Panel No results found for: "CHOL", "TRIG", "HDL", "CHOLHDL", "VLDL", "LDLCALC", "LDLDIRECT"  Physical Exam:    VS:  BP 128/82   Pulse 87   Ht 5' (1.524 m)   Wt 104 lb (47.2 kg)   SpO2 96%   BMI 20.31 kg/m     Wt Readings from Last 3 Encounters:  01/01/23 104 lb (47.2 kg)  12/14/22 107 lb 3.2 oz (48.6 kg)  07/24/22 110 lb 6.4 oz (50.1 kg)    General: Frial, NAD Lungs:Clear to ausculation bilaterally. No wheezes, rales, or rhonchi. Breathing is unlabored. Cardiovascular: RRR with S1 S2. + systolic murmur Extremities: No edema.  Neuro: Alert and oriented. No focal deficits. No facial asymmetry. MAE spontaneously. Psych: Responds to questions appropriately with normal affect.    ASSESSMENT/PLAN:    Severe aortic stenosis: Seen in [facility] consultation by Dr. Ali Lowe with initial plans to follow her progression with repeat echo and OV however patient reports after further thinking, she wished to proceed with TAVR workup. Continues to do well wit NYHA class I symptoms. Lengthy conversion with the patient and care giver today and the patient would like to move forward with cardiac catheterization. This will be followed by CT imaging and surgeon consultation. Obtain CBC, BMET today. Scheduled for cath 12/11/22.   Cardiac catheterization was discussed with the patient fully. The patient understands that risks include but are not limited to stroke (1 in 1000), death (1 in 1), kidney failure [usually temporary] (1 in 500), bleeding (1 in 200), allergic reaction [possibly serious] (1 in 200).  The patient understands and is willing to proceed.     Bradycardia s/p PPM placement 2020: No issues. Last interrogation 12/23 with normal device function and battery life. No changes.   ICH secondary to fall: No recent falls. Walks with  four prong cane for stability.   Moderate MR: Continue to follow with surveillance imaging. Denies symptoms.  DM1: Dx at 82yo. Follows with endocrinology. Continue current regimen.   Shared Decision Making/Informed Consent The risks [stroke (1 in 1000), death (1 in 1000), kidney failure [usually temporary] (1 in 500), bleeding (1 in 200), allergic reaction [possibly serious] (1 in 200)], benefits (diagnostic support and management of coronary artery disease) and alternatives of a cardiac catheterization were discussed in detail with Ms. Mcgannon and she is willing to proceed.    Medication Adjustments/Labs and Tests Ordered: Current medicines are reviewed at length with the patient today.  Concerns regarding medicines are outlined above.  Orders Placed This Encounter  Procedures   Basic metabolic panel   CBC   No orders of the defined types were placed in this encounter.   Patient Instructions  Medication Instructions:  Your physician recommends that you continue on your current medications as directed. Please refer to the Current Medication list given to you today.  *If you need a refill on your cardiac medications before your next appointment, please call your pharmacy*   Lab Work: TODAY: BMET, CBC If you have labs (blood work) drawn today and your tests are completely normal, you will receive your results only by: [place] (if you have MyChart) OR A paper copy in the mail If you have any lab test that is abnormal or we need to change your treatment, we will call you to review the results.  Testing/Procedures:       Cardiac/Peripheral Catheterization   You are scheduled for a Cardiac Catheterization on Thursday, February 15 with Dr. Lenna Sciara.  1. Please arrive at the Main Entrance A at [facility]: [place] [place] [address] on February 15 at 7:00 AM (This time is two hours before your procedure to ensure your preparation). Free valet  parking service is available. You will check in at ADMITTING. The support person will be asked to wait in the waiting room.  It is OK to have someone drop you off and come back when you are ready to be discharged.        Special note: Every effort is made to have your procedure done on time. Please understand that emergencies sometimes delay scheduled procedures.   . 2. Diet: Do not eat solid foods after midnight.  You may have clear liquids until 5 AM the day of the procedure.  3. Labs: TODAY  4. Medication instructions in preparation for your procedure:   Contrast Allergy: No  Take only 5 units of insulin the night before your procedure. Do not take any insulin on the day of the procedure.  Take only your HUMALOG (SLIDING SCALE) the night before your procedure. Do not take any insulin on the day of the procedure.  On the morning of your procedure, take Aspirin 81 mg and any morning medicines NOT listed above.  You may use sips of water.  5. Plan to go home the same day, you will only stay overnight if medically necessary. 6. You MUST have a responsible adult to drive you home. 7. An adult MUST be with you the first 24 hours after you arrive home. 8. Bring a current list of your medications, and the last time and date medication taken. 9. Bring ID and current insurance cards. 10.Please wear clothes that are easy to get on and off and wear slip-on shoes.  Thank you for allowing [place] to care for you!   -- [place] [facility]   Follow-Up: At [facility], you and  your health needs are our priority.  As part of our continuing mission to provide you with exceptional heart care, we have created designated Provider Care Teams.  These Care Teams include your primary Cardiologist (physician) and Advanced Practice Providers (APPs -  Physician Assistants and Nurse Practitioners) who all work together to provide you with the care you need, when you need it.  We  recommend signing up for the patient portal called "MyChart".  Sign up information is provided on this After Visit Summary.  MyChart is used to connect with patients for Virtual Visits (Telemedicine).  Patients are able to view lab/test results, encounter notes, upcoming appointments, etc.  Non-urgent messages can be sent to your provider as well.   To learn more about what you can do with MyChart, go to [email or web address].    Your next appointment:   KEEP YOUR SCHEDULED FOLLOW-UP   Signed, Kathyrn Drown, NP  01/01/2023 12:39 PM    [place] [facility]

## 2023-01-01 ENCOUNTER — Ambulatory Visit: Payer: Medicare PPO | Attending: Cardiology | Admitting: Cardiology

## 2023-01-01 VITALS — BP 128/82 | HR 87 | Ht 60.0 in | Wt 104.0 lb

## 2023-01-01 DIAGNOSIS — N181 Chronic kidney disease, stage 1: Secondary | ICD-10-CM

## 2023-01-01 DIAGNOSIS — Z01818 Encounter for other preprocedural examination: Secondary | ICD-10-CM

## 2023-01-01 DIAGNOSIS — E1022 Type 1 diabetes mellitus with diabetic chronic kidney disease: Secondary | ICD-10-CM

## 2023-01-01 DIAGNOSIS — I10 Essential (primary) hypertension: Secondary | ICD-10-CM

## 2023-01-01 DIAGNOSIS — I442 Atrioventricular block, complete: Secondary | ICD-10-CM | POA: Diagnosis not present

## 2023-01-01 DIAGNOSIS — I350 Nonrheumatic aortic (valve) stenosis: Secondary | ICD-10-CM

## 2023-01-01 DIAGNOSIS — Z950 Presence of cardiac pacemaker: Secondary | ICD-10-CM

## 2023-01-01 LAB — BASIC METABOLIC PANEL
BUN/Creatinine Ratio: 18 (ref 12–28)
BUN: 15 mg/dL (ref 8–27)
CO2: 30 mmol/L — ABNORMAL HIGH (ref 20–29)
Calcium: 9.2 mg/dL (ref 8.7–10.3)
Chloride: 100 mmol/L (ref 96–106)
Creatinine, Ser: 0.82 mg/dL (ref 0.57–1.00)
Glucose: 85 mg/dL (ref 70–99)
Potassium: 4.1 mmol/L (ref 3.5–5.2)
Sodium: 140 mmol/L (ref 134–144)
eGFR: 72 mL/min/{1.73_m2} (ref >59)

## 2023-01-01 LAB — CBC
Hematocrit: 37.9 % (ref 34.0–46.6)
Hemoglobin: 12.6 g/dL (ref 11.1–15.9)
MCH: 29.2 pg (ref 26.6–33.0)
MCHC: 33.2 g/dL (ref 31.5–35.7)
MCV: 88 fL (ref 79–97)
Platelets: 220 10*3/uL (ref 150–450)
RBC: 4.32 x10E6/uL (ref 3.77–5.28)
RDW: 15.6 % — ABNORMAL HIGH (ref 11.7–15.4)
WBC: 6.6 10*3/uL (ref 3.4–10.8)

## 2023-01-01 NOTE — Progress Notes (Addendum)
Pre Surgical Assessment: 5 M Walk Test  34M=16.42f  5 Meter Walk Test- trial 1: 11.33 seconds 5 Meter Walk Test- trial 2: 11.66 seconds 5 Meter Walk Test- trial 3: 10.47 seconds 5 Meter Walk Test Average: 11.15 seconds  STS surgical risk calculation:   Procedure Type: Isolated AVR PERIOPERATIVE OUTCOME ESTIMATE % Operative Mortality 4.49% Morbidity & Mortality 11.3% Stroke 2.65% Renal Failure 1.4% Reoperation 4.22% Prolonged Ventilation 5.3% Deep Sternal Wound Infection 0.034% [facility] 5.23% Short Hospital Stay (<6 days)* 38.3%

## 2023-01-01 NOTE — Patient Instructions (Signed)
Medication Instructions:  Your physician recommends that you continue on your current medications as directed. Please refer to the Current Medication list given to you today.  *If you need a refill on your cardiac medications before your next appointment, please call your pharmacy*   Lab Work: TODAY: BMET, CBC If you have labs (blood work) drawn today and your tests are completely normal, you will receive your results only by: [place] (if you have MyChart) OR A paper copy in the mail If you have any lab test that is abnormal or we need to change your treatment, we will call you to review the results.  Testing/Procedures:       Cardiac/Peripheral Catheterization   You are scheduled for a Cardiac Catheterization on Thursday, February 15 with Dr. Lenna Sciara.  1. Please arrive at the Main Entrance A at [facility]: [place], [place] [address] on February 15 at 7:00 AM (This time is two hours before your procedure to ensure your preparation). Free valet parking service is available. You will check in at ADMITTING. The support person will be asked to wait in the waiting room.  It is OK to have someone drop you off and come back when you are ready to be discharged.        Special note: Every effort is made to have your procedure done on time. Please understand that emergencies sometimes delay scheduled procedures.   . 2. Diet: Do not eat solid foods after midnight.  You may have clear liquids until 5 AM the day of the procedure.  3. Labs: TODAY  4. Medication instructions in preparation for your procedure:   Contrast Allergy: No  Take only 5 units of insulin the night before your procedure. Do not take any insulin on the day of the procedure.  Take only your HUMALOG (SLIDING SCALE) the night before your procedure. Do not take any insulin on the day of the procedure.  On the morning of your procedure, take Aspirin 81 mg and any morning medicines NOT  listed above.  You may use sips of water.  5. Plan to go home the same day, you will only stay overnight if medically necessary. 6. You MUST have a responsible adult to drive you home. 7. An adult MUST be with you the first 24 hours after you arrive home. 8. Bring a current list of your medications, and the last time and date medication taken. 9. Bring ID and current insurance cards. 10.Please wear clothes that are easy to get on and off and wear slip-on shoes.  Thank you for allowing [place] to care for you!   -- [place] [facility]   Follow-Up: At [facility], you and your health needs are our priority.  As part of our continuing mission to provide you with exceptional heart care, we have created designated Provider Care Teams.  These Care Teams include your primary Cardiologist (physician) and Advanced Practice Providers (APPs -  Physician Assistants and Nurse Practitioners) who all work together to provide you with the care you need, when you need it.  We recommend signing up for the patient portal called "MyChart".  Sign up information is provided on this After Visit Summary.  MyChart is used to connect with patients for Virtual Visits (Telemedicine).  Patients are able to view lab/test results, encounter notes, upcoming appointments, etc.  Non-urgent messages can be sent to your provider as well.   To learn more about what you  can do with MyChart, go to [email or web address].    Your next appointment:   [place]

## 2023-01-09 ENCOUNTER — Telehealth: Payer: Self-pay | Admitting: *Deleted

## 2023-01-09 DIAGNOSIS — R54 Age-related physical debility: Secondary | ICD-10-CM | POA: Diagnosis not present

## 2023-01-09 DIAGNOSIS — I350 Nonrheumatic aortic (valve) stenosis: Secondary | ICD-10-CM | POA: Diagnosis not present

## 2023-01-09 DIAGNOSIS — R059 Cough, unspecified: Secondary | ICD-10-CM | POA: Diagnosis not present

## 2023-01-09 DIAGNOSIS — I10 Essential (primary) hypertension: Secondary | ICD-10-CM | POA: Diagnosis not present

## 2023-01-09 DIAGNOSIS — E109 Type 1 diabetes mellitus without complications: Secondary | ICD-10-CM | POA: Diagnosis not present

## 2023-01-09 NOTE — Telephone Encounter (Signed)
Cardiac Catheterization scheduled at [facility] for: Thursday January 11, 2023 9 AM Arrival time and place: [facility] Main Entrance A at: 7 AM  Nothing to eat after midnight prior to procedure, clear liquids until 5 AM day of procedure.  Medication instructions: -Hold:  Insulin-AM of procedure/1/2 usual dose HS prior to procedure if necessary -Other usual morning medications can be taken with sips of water including aspirin 81 mg.  Confirmed patient has responsible adult to drive home post procedure and be with patient first 24 hours after arriving home.  Patient reports no new symptoms concerning for COVID-19 in the past 10 days.  Reviewed procedure instructions with patient.

## 2023-01-10 ENCOUNTER — Other Ambulatory Visit: Payer: Self-pay

## 2023-01-10 DIAGNOSIS — I350 Nonrheumatic aortic (valve) stenosis: Secondary | ICD-10-CM

## 2023-01-10 MED ORDER — METOPROLOL TARTRATE 50 MG PO TABS: ORAL_TABLET | ORAL | 0 refills | Status: DC

## 2023-01-11 ENCOUNTER — Ambulatory Visit (HOSPITAL_COMMUNITY)
Admission: RE | Admit: 2023-01-11 | Discharge: 2023-01-11 | Disposition: A | Discharge status: Home / Self Care | Payer: Medicare PPO | Attending: Internal Medicine | Admitting: Internal Medicine

## 2023-01-11 ENCOUNTER — Other Ambulatory Visit: Payer: Self-pay

## 2023-01-11 ENCOUNTER — Encounter (HOSPITAL_COMMUNITY)
Admission: RE | Disposition: A | Discharge status: Home / Self Care | Payer: Self-pay | Source: Home / Self Care | Attending: Internal Medicine

## 2023-01-11 ENCOUNTER — Encounter (HOSPITAL_COMMUNITY): Payer: Self-pay | Admitting: Internal Medicine

## 2023-01-11 DIAGNOSIS — E109 Type 1 diabetes mellitus without complications: Secondary | ICD-10-CM | POA: Insufficient documentation

## 2023-01-11 DIAGNOSIS — I2510 Atherosclerotic heart disease of native coronary artery without angina pectoris: Secondary | ICD-10-CM | POA: Insufficient documentation

## 2023-01-11 DIAGNOSIS — Z950 Presence of cardiac pacemaker: Secondary | ICD-10-CM | POA: Insufficient documentation

## 2023-01-11 DIAGNOSIS — Z794 Long term (current) use of insulin: Secondary | ICD-10-CM | POA: Insufficient documentation

## 2023-01-11 DIAGNOSIS — R001 Bradycardia, unspecified: Secondary | ICD-10-CM | POA: Diagnosis not present

## 2023-01-11 DIAGNOSIS — E785 Hyperlipidemia, unspecified: Secondary | ICD-10-CM | POA: Diagnosis not present

## 2023-01-11 DIAGNOSIS — I350 Nonrheumatic aortic (valve) stenosis: Secondary | ICD-10-CM

## 2023-01-11 HISTORY — PX: RIGHT/LEFT HEART CATH AND CORONARY ANGIOGRAPHY: CATH118266

## 2023-01-11 LAB — POCT I-STAT EG7
Acid-Base Excess: 1 mmol/L (ref 0.0–2.0)
Acid-Base Excess: 2 mmol/L (ref 0.0–2.0)
Bicarbonate: 28.2 mmol/L — ABNORMAL HIGH (ref 20.0–28.0)
Bicarbonate: 29.1 mmol/L — ABNORMAL HIGH (ref 20.0–28.0)
Calcium, Ion: 1.01 mmol/L — ABNORMAL LOW (ref 1.15–1.40)
Calcium, Ion: 1.1 mmol/L — ABNORMAL LOW (ref 1.15–1.40)
HCT: 30 % — ABNORMAL LOW (ref 36.0–46.0)
HCT: 32 % — ABNORMAL LOW (ref 36.0–46.0)
Hemoglobin: 10.2 g/dL — ABNORMAL LOW (ref 12.0–15.0)
Hemoglobin: 10.9 g/dL — ABNORMAL LOW (ref 12.0–15.0)
O2 Saturation: 73 %
O2 Saturation: 71 %
Potassium: 3.9 mmol/L (ref 3.5–5.1)
Potassium: 4.1 mmol/L (ref 3.5–5.1)
Sodium: 140 mmol/L (ref 135–145)
Sodium: 139 mmol/L (ref 135–145)
TCO2: 30 mmol/L (ref 22–32)
TCO2: 31 mmol/L (ref 22–32)
pCO2, Ven: 54.4 mmHg (ref 44–60)
pCO2, Ven: 54.4 mmHg (ref 44–60)
pH, Ven: 7.322 (ref 7.25–7.43)
pH, Ven: 7.335 (ref 7.25–7.43)
pO2, Ven: 42 mmHg (ref 32–45)
pO2, Ven: 40 mmHg (ref 32–45)

## 2023-01-11 LAB — POCT I-STAT 7, (LYTES, BLD GAS, ICA,H+H)
Acid-Base Excess: 3 mmol/L — ABNORMAL HIGH (ref 0.0–2.0)
Bicarbonate: 28.9 mmol/L — ABNORMAL HIGH (ref 20.0–28.0)
Calcium, Ion: 1.16 mmol/L (ref 1.15–1.40)
HCT: 32 % — ABNORMAL LOW (ref 36.0–46.0)
Hemoglobin: 10.9 g/dL — ABNORMAL LOW (ref 12.0–15.0)
O2 Saturation: 96 %
Potassium: 4.2 mmol/L (ref 3.5–5.1)
Sodium: 137 mmol/L (ref 135–145)
TCO2: 30 mmol/L (ref 22–32)
pCO2 arterial: 51.4 mmHg — ABNORMAL HIGH (ref 32–48)
pH, Arterial: 7.359 (ref 7.35–7.45)
pO2, Arterial: 85 mmHg (ref 83–108)

## 2023-01-11 LAB — GLUCOSE, CAPILLARY
Glucose-Capillary: 269 mg/dL — ABNORMAL HIGH (ref 70–99)
Glucose-Capillary: 282 mg/dL — ABNORMAL HIGH (ref 70–99)

## 2023-01-11 LAB — POCT ACTIVATED CLOTTING TIME: Activated Clotting Time: 233 seconds

## 2023-01-11 SURGERY — RIGHT/LEFT HEART CATH AND CORONARY ANGIOGRAPHY
Anesthesia: LOCAL

## 2023-01-11 MED ORDER — SODIUM CHLORIDE 0.9 % WEIGHT BASED INFUSION
3.0000 mL/kg/h | INTRAVENOUS | Status: AC
  Administered 2023-01-11: 3 mL/kg/h via INTRAVENOUS

## 2023-01-11 MED ORDER — HYDRALAZINE HCL 20 MG/ML IJ SOLN: 10.0000 mg | INTRAMUSCULAR | Status: DC | PRN

## 2023-01-11 MED ORDER — SODIUM CHLORIDE 0.9% FLUSH: 3.0000 mL | INTRAVENOUS | Status: DC | PRN

## 2023-01-11 MED ORDER — VERAPAMIL HCL 2.5 MG/ML IV SOLN
INTRAVENOUS | Status: DC | PRN
  Administered 2023-01-11: 10 mL via INTRA_ARTERIAL

## 2023-01-11 MED ORDER — SODIUM CHLORIDE 0.9% FLUSH: 3.0000 mL | Freq: Two times a day (BID) | INTRAVENOUS | Status: DC

## 2023-01-11 MED ORDER — VERAPAMIL HCL 2.5 MG/ML IV SOLN
INTRAVENOUS | Status: AC
  Filled 2023-01-11: qty 2

## 2023-01-11 MED ORDER — LIDOCAINE HCL (PF) 1 % IJ SOLN
INTRAMUSCULAR | Status: AC
  Filled 2023-01-11: qty 30

## 2023-01-11 MED ORDER — ASPIRIN 81 MG PO CHEW
81.0000 mg | CHEWABLE_TABLET | ORAL | Status: AC
  Administered 2023-01-11: 81 mg via ORAL
  Filled 2023-01-11: qty 1

## 2023-01-11 MED ORDER — HEPARIN SODIUM (PORCINE) 1000 UNIT/ML IJ SOLN
INTRAMUSCULAR | Status: DC | PRN
  Administered 2023-01-11: 2500 [IU] via INTRAVENOUS

## 2023-01-11 MED ORDER — FENTANYL CITRATE (PF) 100 MCG/2ML IJ SOLN
INTRAMUSCULAR | Status: DC | PRN
  Administered 2023-01-11: 25 ug via INTRAVENOUS

## 2023-01-11 MED ORDER — HEPARIN SODIUM (PORCINE) 1000 UNIT/ML IJ SOLN
INTRAMUSCULAR | Status: AC
  Filled 2023-01-11: qty 10

## 2023-01-11 MED ORDER — SODIUM CHLORIDE 0.9 % WEIGHT BASED INFUSION: 1.0000 mL/kg/h | INTRAVENOUS | Status: DC

## 2023-01-11 MED ORDER — MIDAZOLAM HCL 2 MG/2ML IJ SOLN
INTRAMUSCULAR | Status: AC
  Filled 2023-01-11: qty 2

## 2023-01-11 MED ORDER — ONDANSETRON HCL 4 MG/2ML IJ SOLN: 4.0000 mg | Freq: Four times a day (QID) | INTRAMUSCULAR | Status: DC | PRN

## 2023-01-11 MED ORDER — HEPARIN (PORCINE) IN NACL 1000-0.9 UT/500ML-% IV SOLN
INTRAVENOUS | Status: DC | PRN
  Administered 2023-01-11 (×2): 500 mL

## 2023-01-11 MED ORDER — FENTANYL CITRATE (PF) 100 MCG/2ML IJ SOLN
INTRAMUSCULAR | Status: AC
  Filled 2023-01-11: qty 2

## 2023-01-11 MED ORDER — SODIUM CHLORIDE 0.9 % IV SOLN: 250.0000 mL | INTRAVENOUS | Status: DC | PRN

## 2023-01-11 MED ORDER — IOHEXOL 350 MG/ML SOLN
INTRAVENOUS | Status: DC | PRN
  Administered 2023-01-11: 20 mL

## 2023-01-11 MED ORDER — LIDOCAINE HCL (PF) 1 % IJ SOLN
INTRAMUSCULAR | Status: DC | PRN
  Administered 2023-01-11 (×2): 2 mL

## 2023-01-11 MED ORDER — ACETAMINOPHEN 325 MG PO TABS: 650.0000 mg | ORAL_TABLET | ORAL | Status: DC | PRN

## 2023-01-11 MED ORDER — MIDAZOLAM HCL 2 MG/2ML IJ SOLN
INTRAMUSCULAR | Status: DC | PRN
  Administered 2023-01-11: 1 mg via INTRAVENOUS

## 2023-01-11 MED ORDER — SODIUM CHLORIDE 0.9 % IV SOLN: INTRAVENOUS | Status: DC

## 2023-01-11 MED ORDER — LABETALOL HCL 5 MG/ML IV SOLN: 10.0000 mg | INTRAVENOUS | Status: DC | PRN

## 2023-01-11 SURGICAL SUPPLY — 11 items
CATH BALLN WEDGE 5F 110CM (CATHETERS) IMPLANT
CATH OPTITORQUE TIG 4.0 6F (CATHETERS) IMPLANT
DEVICE RAD TR BAND REGULAR (VASCULAR PRODUCTS) IMPLANT
GLIDESHEATH SLEND SS 6F .021 (SHEATH) IMPLANT
KIT HEART LEFT (KITS) ×2 IMPLANT
PACK CARDIAC CATHETERIZATION (CUSTOM PROCEDURE TRAY) ×2 IMPLANT
SHEATH GLIDE SLENDER 4/5FR (SHEATH) IMPLANT
SHEATH PROBE COVER 6X72 (BAG) IMPLANT
TRANSDUCER W/STOPCOCK (MISCELLANEOUS) ×2 IMPLANT
TUBING CIL FLEX 10 FLL-RA (TUBING) ×2 IMPLANT
WIRE EMERALD 3MM-J .035X260CM (WIRE) IMPLANT

## 2023-01-11 NOTE — Discharge Instructions (Signed)

## 2023-01-11 NOTE — Interval H&P Note (Signed)
History and Physical Interval Note:  01/11/2023 7:15 AM  Danielle Clarke  has presented today for surgery, with the diagnosis of aortic stenosis.  The various methods of treatment have been discussed with the patient and family. After consideration of risks, benefits and other options for treatment, the patient has consented to  Procedure(s): RIGHT/LEFT HEART CATH AND CORONARY ANGIOGRAPHY (N/A) as a surgical intervention.  The patient's history has been reviewed, patient examined, no change in status, stable for surgery.  I have reviewed the patient's chart and labs.  Questions were answered to the patient's satisfaction.     Early Osmond

## 2023-01-18 ENCOUNTER — Ambulatory Visit (HOSPITAL_COMMUNITY)
Admission: RE | Admit: 2023-01-18 | Discharge: 2023-01-18 | Disposition: A | Discharge status: Home / Self Care | Payer: Medicare PPO | Source: Ambulatory Visit | Attending: Internal Medicine | Admitting: Internal Medicine

## 2023-01-18 DIAGNOSIS — Z01810 Encounter for preprocedural cardiovascular examination: Secondary | ICD-10-CM | POA: Diagnosis not present

## 2023-01-18 DIAGNOSIS — I350 Nonrheumatic aortic (valve) stenosis: Secondary | ICD-10-CM

## 2023-01-18 DIAGNOSIS — Z01818 Encounter for other preprocedural examination: Secondary | ICD-10-CM | POA: Diagnosis not present

## 2023-01-18 DIAGNOSIS — I700 Atherosclerosis of aorta: Secondary | ICD-10-CM | POA: Diagnosis not present

## 2023-01-18 MED ORDER — IOHEXOL 350 MG/ML SOLN
100.0000 mL | Freq: Once | INTRAVENOUS | Status: AC | PRN
  Administered 2023-01-18: 100 mL via INTRAVENOUS

## 2023-01-29 ENCOUNTER — Institutional Professional Consult (permissible substitution): Payer: Medicare PPO | Admitting: Thoracic Surgery (Cardiothoracic Vascular Surgery)

## 2023-01-29 ENCOUNTER — Encounter: Payer: Self-pay | Admitting: Thoracic Surgery (Cardiothoracic Vascular Surgery)

## 2023-01-29 ENCOUNTER — Other Ambulatory Visit: Payer: Self-pay

## 2023-01-29 VITALS — BP 146/67 | HR 94 | Resp 20 | Ht 60.0 in | Wt 102.0 lb

## 2023-01-29 DIAGNOSIS — I350 Nonrheumatic aortic (valve) stenosis: Secondary | ICD-10-CM | POA: Diagnosis not present

## 2023-01-29 NOTE — Patient Instructions (Signed)
TAVR next tuesday

## 2023-01-29 NOTE — Progress Notes (Signed)
[address]       [place] [address]             [phone]           Danielle Clarke [facility] Medical Record [id number] Date of Birth: 30-Jul-1941  Danielle Osmond, MD Danielle Jewel, MD  Chief Complaint: Dyspnea and aortic stenosis  History of Present Illness:     82 yo female with ongoing DOE and has felt that it hasn't kept her from doing many of her activities but she does need to rest afterwards and feels more tired and sleeps during the day. She has no lightheadedness but did have a syncopal episode years ago that required PPM for bradycardia. Pt has no CP or palptiations. She has been followed by Danielle Clarke and more recently by Danielle Clarke and with echo having mean gradient of 65mHg and with normal LV function, pt with her symptoms was decided to follow up in 3 months. Pt however after further discussion felt that she wanted to proceed with TAVR and had CTA and cath. Pt with minimal CAD and with sizing of a 224mSapien (anatomy not sufficient for Corevalve).  Pt here with care giver and we discussed the indications and procedure in depth. [place] and wishes procedure to be done prior to her leaving      Past Medical History:  Diagnosis Date   Aortic stenosis, mild    Arthritis    Asthmatic bronchitis    with colds per patient   Carotid artery disease (HCC)    40-59% bilateral ICA stenosis   Cataract    Cutaneous abscess of right foot    Glaucoma    Heart murmur    Danielle SmTamala Julians her  cardiologist.    Hyperlipemia    Hypertension    Danielle. SmTamala Clarke 2 years ago   [facility]   Osteoporosis    Syncope    Type 1 diabetes mellitus ([facility]    Past Surgical History:  Procedure Laterality Date   ANKLE FRACTURE SURGERY Left 2001   steel plate and 3 screws    ANTERIOR CERVICAL CORPECTOMY N/A 07/31/2014   Procedure: Cervical Four to Cervical Six Corpectomy;  Surgeon: ErFloyce StakesMD;  Location: [facility];  Service: Neurosurgery;  Laterality: N/A;  C4 to C6 Corpectomy   BREAST SURGERY  1988   CATARACT EXTRACTION W/ INTRAOCULAR LENS  IMPLANT, BILATERAL Bilateral 2011   right and then left   EYE SURGERY     FRACTURE SURGERY     HAMMER TOE SURGERY  1998   I & D EXTREMITY Right 09/27/2018   Procedure: IRRIGATION AND DEBRIDEMENT RIGHT FOOT;  Surgeon: Danielle MinionMD;  [place] [facility] [place];  Laterality: Right;   JOINT REPLACEMENT     PACEMAKER IMPLANT N/A 02/10/2019   Procedure: PACEMAKER IMPLANT;  Surgeon: TaEvans LanceMD;  [facility];  Service: Cardiovascular;  Laterality: N/A;   RIGHT/LEFT HEART CATH AND CORONARY ANGIOGRAPHY N/A 01/11/2023   Procedure: RIGHT/LEFT HEART CATH AND CORONARY ANGIOGRAPHY;  Surgeon: ThEarly OsmondMD;  [place] [facility];  Service: Cardiovascular;  Laterality: N/A;   TOS[place] TOTAL SHOULDER REPLACEMENT  2010   right shoulder    TUBAL LIGATION  1979   TYMPANOSTOMY TUBE  PLACEMENT Bilateral     Social History   Tobacco Use  Smoking Status Never  Smokeless Tobacco Never    Social History   Substance and Sexual Activity  Alcohol Use Yes   Alcohol/week: 2.0 standard drinks of alcohol   Types: 2 Glasses of wine per week    Social History   Socioeconomic History   Marital status: Divorced    Spouse name: Not on file   Number of children: 2   Years of education: [employer]   Highest education level: Not on file  Occupational History   Occupation: Retired    Fish farm manager: RETIRED  Tobacco Use   Smoking status: Never   Smokeless tobacco: Never  Vaping Use   Vaping Use: Never used  Substance and Sexual Activity   Alcohol use: Yes    Alcohol/week: 2.0 standard drinks of alcohol    Types: 2 Glasses of wine per week   Drug use: Never   Sexual activity: Never  Other Topics Concern   Not on file  Social History Narrative   Patient  lives at home alone.   Caffeine Use:    Social Determinants of Radio broadcast assistant Strain: Not on file  Food Insecurity: Not on file  Transportation Needs: Not on file  Physical Activity: Not on file  Stress: Not on file  Social Connections: Not on file  Intimate Partner Violence: Not on file    Allergies  Allergen Reactions   Cefaclor Anaphylaxis    Tolerated cephalexin in 2018   Tetracycline Anaphylaxis   Vancomycin Anaphylaxis   Augmentin [Amoxicillin-Pot Clavulanate] Other (See Comments)    Reaction unknown >> Anaphylaxis Has patient had a PCN reaction causing immediate rash, facial/tongue/throat swelling, SOB or lightheadedness with hypotension: Unknown Has patient had a PCN reaction causing severe rash involving mucus membranes or skin necrosis: Unknown Has patient had a PCN reaction that required hospitalization: Unknown Has patient had a PCN reaction occurring within the last 10 years: Unknown If all of the above answers are "NO", then may proceed with Cephalosporin use.    Nsaids     CKD   Quinapril Hcl     elevated potassium   Sulfamethoxazole-Trimethoprim     decreased kidney function   Prednisone Other (See Comments)    Reaction unknown    Current Outpatient Medications  Medication Sig Dispense Refill   aspirin EC 81 MG tablet Take 1 tablet (81 mg total) by mouth daily. Swallow whole. (Patient taking differently: Take 81 mg by mouth at bedtime. Swallow whole.) 30 tablet 12   Cholecalciferol (VITAMIN D) 50 MCG (2000 UT) tablet Take 2,000 Units by mouth daily.     Cyanocobalamin (B-12) 5000 MCG CAPS Take 5,000 mcg by mouth daily.     ferrous sulfate 325 (65 FE) MG tablet Take 325 mg by mouth daily.     HUMALOG KWIKPEN 100 UNIT/ML KwikPen Inject 0-10 Units into the skin 3 (three) times daily as needed (high blood sugar).     Insulin Glargine (LANTUS) 100 UNIT/ML Solostar Pen Inject 13 Units into the skin daily. (Patient taking differently: Inject 10 Units  into the skin daily.) 15 mL 11   Menthol-Methyl Salicylate (SALONPAS PAIN RELIEF PATCH) PTCH Apply 1 patch topically daily as needed (patch).     metoprolol tartrate (LOPRESSOR) 50 MG tablet Take as directed prior to February 22nd CT scan 1 tablet 0   montelukast (SINGULAIR) 10 MG tablet Take 10 mg by mouth at bedtime.  Polyethyl Glycol-Propyl Glycol (SYSTANE OP) Place 1 drop into both eyes daily as needed (dry eyes).     protein supplement shake (PREMIER PROTEIN) LIQD Take 11 oz by mouth 2 (two) times daily.     simvastatin (ZOCOR) 40 MG tablet Take 40 mg by mouth at bedtime.     No current facility-administered medications for this visit.     Family History  Problem Relation Age of Onset   Cancer Mother    Heart Problems Father        Physical Exam: Small stature female Lungs: clear Card: RR with 3.6 sem Ext: no edema Neuro: no focal deficits     Diagnostic Studies & Laboratory data: I have personally reviewed the following studies and agree with the findings    TTE (11/2022) IMPRESSIONS     1. Left ventricular ejection fraction, by estimation, is 55 to 60%. The  left ventricle has normal function. The left ventricle has no regional  wall motion abnormalities. There is mild left ventricular hypertrophy.  Left ventricular diastolic parameters  are consistent with Grade I diastolic dysfunction (impaired relaxation).  Elevated left ventricular end-diastolic pressure.   2. Right ventricular systolic function is normal. The right ventricular  size is normal. There is mildly elevated pulmonary artery systolic  pressure. The estimated right ventricular systolic pressure is 0000000 mmHg.   3. The mitral valve is abnormal. Moderate mitral valve regurgitation. No  evidence of mitral stenosis. Moderate mitral annular calcification.   4. The aortic valve is calcified. Aortic valve regurgitation is moderate.  Severe aortic valve stenosis. Aortic regurgitation PHT measures 365  msec.  Aortic valve area, by VTI measures 0.50 cm. Aortic valve mean gradient  measures 36.5 mmHg. Aortic valve  Vmax measures 4.00 m/s. Peak gradient 64 mmHg. DI is 0.20.   5. The inferior vena cava is normal in size with greater than 50%  respiratory variability, suggesting right atrial pressure of 3 mmHg.   Comparison(s): Changes from prior study are noted. 02/21/2022: LVEF 55-60%,  severe AS - mean gradient 35 mmHg.   FINDINGS   Left Ventricle: Left ventricular ejection fraction, by estimation, is 55  to 60%. The left ventricle has normal function. The left ventricle has no  regional wall motion abnormalities. The left ventricular internal cavity  size was normal in size. There is   mild left ventricular hypertrophy. Left ventricular diastolic parameters  are consistent with Grade I diastolic dysfunction (impaired relaxation).  Elevated left ventricular end-diastolic pressure.   Right Ventricle: The right ventricular size is normal. No increase in  right ventricular wall thickness. Right ventricular systolic function is  normal. There is mildly elevated pulmonary artery systolic pressure. The  tricuspid regurgitant velocity is 3.16   m/s, and with an assumed right atrial pressure of 3 mmHg, the estimated  right ventricular systolic pressure is 0000000 mmHg.   Left Atrium: Left atrial size was normal in size.   Right Atrium: Right atrial size was normal in size.   Pericardium: There is no evidence of pericardial effusion.   Mitral Valve: The mitral valve is abnormal. There is moderate  calcification of the posterior mitral valve leaflet(s). Moderate mitral  annular calcification. Moderate mitral valve regurgitation. No evidence of  mitral valve stenosis.   Tricuspid Valve: The tricuspid valve is grossly normal. Tricuspid valve  regurgitation is mild.   Aortic Valve: The aortic valve is calcified. Aortic valve regurgitation is  moderate. Aortic regurgitation PHT measures 365  msec. Severe aortic  stenosis is  present. Aortic valve mean gradient measures 36.5 mmHg. Aortic  valve peak gradient measures 64.0  mmHg. Aortic valve area, by VTI measures 0.50 cm.   Pulmonic Valve: The pulmonic valve was grossly normal. Pulmonic valve  regurgitation is trivial.   Aorta: The aortic root and ascending aorta are structurally normal, with  no evidence of dilitation.   Venous: The inferior vena cava is normal in size with greater than 50%  respiratory variability, suggesting right atrial pressure of 3 mmHg.   IAS/Shunts: No atrial level shunt detected by color flow Doppler.   Additional Comments: A device lead is visualized.     LEFT VENTRICLE  PLAX 2D  LVIDd:         4.40 cm   Diastology  LVIDs:         3.20 cm   LV e' medial:    4.24 cm/s  LV PW:         1.30 cm   LV E/e' medial:  23.5  LV IVS:        1.00 cm   LV e' lateral:   4.24 cm/s  LVOT diam:     1.80 cm   LV E/e' lateral: 23.5  LV SV:         48  LV SV Index:   33  LVOT Area:     2.54 cm     RIGHT VENTRICLE             IVC  RV S prime:     12.00 cm/s  IVC diam: 1.10 cm  TAPSE (M-mode): 1.4 cm  RVSP:           42.9 mmHg   LEFT ATRIUM             Index        RIGHT ATRIUM           Index  LA diam:        4.00 cm 2.76 cm/m   RA Pressure: 3.00 mmHg  LA Vol (A2C):   49.1 ml 33.86 ml/m  RA Area:     8.51 cm  LA Vol (A4C):   37.0 ml 25.52 ml/m  RA Volume:   17.00 ml  11.72 ml/m  LA Biplane Vol: 44.6 ml 30.76 ml/m   AORTIC VALVE  AV Area (Vmax):    0.53 cm  AV Area (Vmean):   0.54 cm  AV Area (VTI):     0.50 cm  AV Vmax:           400.00 cm/s  AV Vmean:          281.500 cm/s  AV VTI:            0.967 m  AV Peak Grad:      64.0 mmHg  AV Mean Grad:      36.5 mmHg  LVOT Vmax:         84.00 cm/s  LVOT Vmean:        60.200 cm/s  LVOT VTI:          0.189 m  LVOT/AV VTI ratio: 0.20  AI PHT:            365 msec    AORTA  Ao Root diam: 2.50 cm  Ao Asc diam:  2.20 cm   MITRAL VALVE                 TRICUSPID VALVE  MV Area (PHT): 4.26 cm     TR  Peak grad:   39.9 mmHg  MV Decel Time: 178 msec     TR Vmax:        316.00 cm/s  MV E velocity: 99.70 cm/s   Estimated RAP:  3.00 mmHg  MV A velocity: 138.00 cm/s  RVSP:           42.9 mmHg  MV E/A ratio:  0.72                              SHUNTS                              Systemic VTI:  0.19 m                              Systemic Diam: 1.80 cm   CATH (12/2022)  Conclusion  1.  Minimal obstructive coronary artery disease. 2.  Mean RA pressure 5 mmHg, RV pressure of 41/-4 with RV end-diastolic pressure of 7 mmHg, mean wedge pressure of 8 mmHg, and mean PA pressure 20 mmHg with a Fick cardiac output of 4.7 L/min and a Fick cardiac index of 3.3 L/min/m.    Recent Radiology Findings:   CTA (12/2022)  FINDINGS: Aortic Valve: Tri leaflet calcified aortic valve with restricted leaflet motion Calcium score somewhat low at 765   Aorta: Normal arch vessels no aneurysm Moderate calcific atherosclerosis   Sino-tubular Junction: 21.5 mm with large area of circumferential calcium extending from RCA ostium about 1/3 of circumference   Ascending Thoracic Aorta: 24.5 mm   Aortic Arch: 22 mm   Descending Thoracic Aorta: 17 mm   Sinus of Valsalva Measurements:   Non-coronary: 21.4 mm  Height 15.2 mm   Right - coronary: 22.1 mm  Height 14.9 mm   Left -   coronary: 21.8 mm  Height 15.5 mm   Coronary Artery Height above Annulus:   Left Main: 11 mm above annulus   Right Coronary: 11.4 mm above annulus   Virtual Basal Annulus Measurements: Done in 30% phase   Maximum / Minimum Diameter: 20.7 mm x 18.4 mm Average diameter 20 mm   Perimeter: 64.1   Area: 315 mm2   Coronary Arteries: Sufficient height above annulus for deployment   Optimum Fluoroscopic Angle for Delivery: RAO 6 Caudal 8 degrees   IMPRESSION: 1. Calcified tri leaflet aortic valve with small annular area of 315 mm2 most suitable for a 23 mm Medtronic Evolut  valve   2.  Coronary arteries sufficient height for deployment   3.  Optimum angiographic angle for deployment RAO 6 Caudal 8 degrees   4.  Membranous septal length 6.9 mm   5.  Aortic Valve Calcium score 765   6.  Severe posterior mitral annular calcification   7.  Pacing wires noted in RA/RV      Recent Lab Findings: Lab Results  Component Value Date   WBC 6.6 01/01/2023   HGB 10.9 (L) 01/11/2023   HCT 32.0 (L) 01/11/2023   PLT 220 01/01/2023   GLUCOSE 85 01/01/2023   ALT 16 10/14/2020   AST 21 10/14/2020   NA 139 01/11/2023   K 4.1 01/11/2023   CL 100 01/01/2023   CREATININE 0.82 01/01/2023   BUN 15 01/01/2023   CO2 30 (H) 01/01/2023   TSH 0.910 11/19/2021   INR 0.9 02/07/2019  HGBA1C 9.4 (H) 10/14/2020      Assessment / Plan:     82 yo female with NYHA class II symptoms of severe AS and with normal LV function with minimal CAD. Pt with history of ICH following fall and PPM. She would benefit from AVR and with her age and comorbidities would best be served with TAVR. She has TF access and a 41m Sapien for her size would be sufficient. She will not be a bailout candidate.    I have spent 60 min in review of the records, viewing studies and in face to face with patient and in coordination of future care    PCoralie Common3/02/2023 8:42 AM

## 2023-02-02 ENCOUNTER — Encounter (HOSPITAL_COMMUNITY)
Admission: RE | Admit: 2023-02-02 | Discharge: 2023-02-02 | Disposition: A | Discharge status: Home / Self Care | Payer: Medicare PPO | Source: Ambulatory Visit | Attending: Internal Medicine | Admitting: Internal Medicine

## 2023-02-02 ENCOUNTER — Ambulatory Visit (HOSPITAL_COMMUNITY)
Admission: RE | Admit: 2023-02-02 | Discharge: 2023-02-02 | Disposition: A | Discharge status: Home / Self Care | Payer: Medicare PPO | Source: Ambulatory Visit | Attending: Internal Medicine | Admitting: Internal Medicine

## 2023-02-02 DIAGNOSIS — Z01818 Encounter for other preprocedural examination: Secondary | ICD-10-CM | POA: Insufficient documentation

## 2023-02-02 DIAGNOSIS — Z1152 Encounter for screening for COVID-19: Secondary | ICD-10-CM | POA: Diagnosis not present

## 2023-02-02 DIAGNOSIS — I350 Nonrheumatic aortic (valve) stenosis: Secondary | ICD-10-CM | POA: Diagnosis not present

## 2023-02-02 LAB — SURGICAL PCR SCREEN
MRSA, PCR: NEGATIVE
Staphylococcus aureus: NEGATIVE

## 2023-02-02 LAB — COMPREHENSIVE METABOLIC PANEL
ALT: 14 U/L (ref 0–44)
AST: 18 U/L (ref 15–41)
Albumin: 3.7 g/dL (ref 3.5–5.0)
Alkaline Phosphatase: 91 U/L (ref 38–126)
Anion gap: 7 (ref 5–15)
BUN: 13 mg/dL (ref 8–23)
CO2: 30 mmol/L (ref 22–32)
Calcium: 9 mg/dL (ref 8.9–10.3)
Chloride: 100 mmol/L (ref 98–111)
Creatinine, Ser: 1.08 mg/dL — ABNORMAL HIGH (ref 0.44–1.00)
GFR, Estimated: 52 mL/min — ABNORMAL LOW (ref >60)
Glucose, Bld: 247 mg/dL — ABNORMAL HIGH (ref 70–99)
Potassium: 4 mmol/L (ref 3.5–5.1)
Sodium: 137 mmol/L (ref 135–145)
Total Bilirubin: 0.4 mg/dL (ref 0.3–1.2)
Total Protein: 7.2 g/dL (ref 6.5–8.1)

## 2023-02-02 LAB — CBC
HCT: 40.9 % (ref 36.0–46.0)
Hemoglobin: 13.4 g/dL (ref 12.0–15.0)
MCH: 30 pg (ref 26.0–34.0)
MCHC: 32.8 g/dL (ref 30.0–36.0)
MCV: 91.5 fL (ref 80.0–100.0)
Platelets: 241 10*3/uL (ref 150–400)
RBC: 4.47 MIL/uL (ref 3.87–5.11)
RDW: 13.6 % (ref 11.5–15.5)
WBC: 6.8 10*3/uL (ref 4.0–10.5)
nRBC: 0 % (ref 0.0–0.2)

## 2023-02-02 LAB — URINALYSIS, ROUTINE W REFLEX MICROSCOPIC
Bacteria, UA: NONE SEEN
Bilirubin Urine: NEGATIVE
Glucose, UA: >=500 mg/dL — AB
Hgb urine dipstick: NEGATIVE
Ketones, ur: 5 mg/dL — AB
Leukocytes,Ua: NEGATIVE
Nitrite: NEGATIVE
Protein, ur: NEGATIVE mg/dL
Specific Gravity, Urine: 1.021 (ref 1.005–1.030)
pH: 5 (ref 5.0–8.0)

## 2023-02-02 LAB — PROTIME-INR
INR: 1 (ref 0.8–1.2)
Prothrombin Time: 12.9 seconds (ref 11.4–15.2)

## 2023-02-02 LAB — SARS CORONAVIRUS 2 (TAT 6-24 HRS): SARS Coronavirus 2: NEGATIVE

## 2023-02-02 NOTE — Progress Notes (Signed)
Patient signed all consents at PAT lab appointment. CHG soap and instructions were given to patient. CHG surgical prep reviewed with patient and all questions answered.  Pt denies any respiratory illness/infection in the last two months.

## 2023-02-05 LAB — TYPE AND SCREEN
ABO/RH(D): A POS
Antibody Screen: NEGATIVE

## 2023-02-05 MED ORDER — POTASSIUM CHLORIDE 2 MEQ/ML IV SOLN
80.0000 meq | INTRAVENOUS | Status: DC
  Filled 2023-02-05: qty 40

## 2023-02-05 MED ORDER — DEXMEDETOMIDINE HCL IN NACL 400 MCG/100ML IV SOLN
0.1000 ug/kg/h | INTRAVENOUS | Status: AC
  Administered 2023-02-06: 1 ug/kg/h via INTRAVENOUS
  Administered 2023-02-06: 23.16 ug via INTRAVENOUS
  Filled 2023-02-05: qty 100

## 2023-02-05 MED ORDER — HEPARIN 30,000 UNITS/1000 ML (OHS) CELLSAVER SOLUTION
Status: DC
  Filled 2023-02-05: qty 1000

## 2023-02-05 MED ORDER — MAGNESIUM SULFATE 50 % IJ SOLN
40.0000 meq | INTRAMUSCULAR | Status: DC
  Filled 2023-02-05: qty 9.85

## 2023-02-05 MED ORDER — CEFAZOLIN SODIUM-DEXTROSE 2-4 GM/100ML-% IV SOLN
2.0000 g | INTRAVENOUS | Status: AC
  Administered 2023-02-06: 2 g via INTRAVENOUS
  Filled 2023-02-05: qty 100

## 2023-02-05 MED ORDER — NOREPINEPHRINE 4 MG/250ML-% IV SOLN
0.0000 ug/min | INTRAVENOUS | Status: DC
  Filled 2023-02-05: qty 250

## 2023-02-05 NOTE — H&P (Signed)
[address]       [place] [address]             [phone]                                   Danielle Clarke [facility] Medical Record [id number] Date of Birth: 14-Apr-1941   Early Osmond, MD Mckinley Jewel, MD   Chief Complaint: Dyspnea and aortic stenosis   History of Present Illness:     82 yo female with ongoing DOE and has felt that it hasn't kept her from doing many of her activities but she does need to rest afterwards and feels more tired and sleeps during the day. She has no lightheadedness but did have a syncopal episode years ago that required PPM for bradycardia. Pt has no CP or palptiations. She has been followed by Dr Tamala Julian and more recently by Dr Ali Lowe and with echo having mean gradient of 41mHg and with normal LV function, pt with her symptoms was decided to follow up in 3 months. Pt however after further discussion felt that she wanted to proceed with TAVR and had CTA and cath. Pt with minimal CAD and with sizing of a 251mSapien (anatomy not sufficient for Corevalve).  Pt here with care giver and we discussed the indications and procedure in depth. Her [place] next [place] and wishes procedure to be done prior to her leaving             Past Medical History:  Diagnosis Date   Aortic stenosis, mild     Arthritis     Asthmatic bronchitis      with colds per patient   Carotid artery disease (HCC)      40-59% bilateral ICA stenosis   Cataract     Cutaneous abscess of right foot     Glaucoma     Heart murmur      Dr SmTamala Julians her  cardiologist.    Hyperlipemia     Hypertension      Dr. SmTamala Julian 2 years ago   [facility]   Osteoporosis     Syncope     Type 1 diabetes mellitus (HCPierson            Past Surgical History:  Procedure Laterality Date   ANKLE FRACTURE SURGERY Left 2001    steel plate and 3 screws    ANTERIOR CERVICAL CORPECTOMY N/A 07/31/2014     Procedure: Cervical Four to Cervical Six Corpectomy;  Surgeon: ErFloyce StakesMD;  Location: [facility];  Service: Neurosurgery;  Laterality: N/A;  C4 to C6 Corpectomy   BREAST SURGERY   1988   CATARACT EXTRACTION W/ INTRAOCULAR LENS  IMPLANT, BILATERAL Bilateral 2011    right and then left   EYE SURGERY       FRACTURE SURGERY       HAMMER TOE SURGERY   1998   I & D EXTREMITY Right 09/27/2018    Procedure: IRRIGATION AND DEBRIDEMENT RIGHT FOOT;  Surgeon: DuNewt MinionMD;  [facility];  Laterality: Right;   JOINT REPLACEMENT       PACEMAKER IMPLANT N/A 02/10/2019    Procedure: PACEMAKER IMPLANT;  Surgeon: TaEvans LanceMD;  [facility];  Service: Cardiovascular;  Laterality: N/A;   RIGHT/LEFT HEART CATH AND CORONARY ANGIOGRAPHY N/A 01/11/2023    Procedure: RIGHT/LEFT HEART CATH AND CORONARY ANGIOGRAPHY;  Surgeon: Early Osmond, MD;  Location: [facility] CV LAB;  Service: Cardiovascular;  Laterality: N/A;   TONSILLECTOMY AND ADENOIDECTOMY   1948   TOTAL SHOULDER REPLACEMENT   2010    right shoulder    TUBAL LIGATION   1979   TYMPANOSTOMY TUBE PLACEMENT Bilateral        Social History       Tobacco Use  Smoking Status Never  Smokeless Tobacco Never    Social History        Substance and Sexual Activity  Alcohol Use Yes   Alcohol/week: 2.0 standard drinks of alcohol   Types: 2 Glasses of wine per week      Social History         Socioeconomic History   Marital status: Divorced      Spouse name: Not on file   Number of children: 2   Years of education: [employer]   Highest education level: Not on file  Occupational History   Occupation: Retired      Fish farm manager: RETIRED  Tobacco Use   Smoking status: Never   Smokeless tobacco: Never  Vaping Use   Vaping Use: Never used  Substance and Sexual Activity   Alcohol use: Yes      Alcohol/week: 2.0 standard drinks of alcohol      Types: 2 Glasses of wine per week   Drug  use: Never   Sexual activity: Never  Other Topics Concern   Not on file  Social History Narrative    Patient lives at home alone.    Caffeine Use:     Social Determinants of Adult nurse Strain: Not on file  Food Insecurity: Not on file  Transportation Needs: Not on file  Physical Activity: Not on file  Stress: Not on file  Social Connections: Not on file  Intimate Partner Violence: Not on file           Allergies  Allergen Reactions   Cefaclor Anaphylaxis      Tolerated cephalexin in 2018   Tetracycline Anaphylaxis   Vancomycin Anaphylaxis   Augmentin [Amoxicillin-Pot Clavulanate] Other (See Comments)      Reaction unknown >> Anaphylaxis Has patient had a PCN reaction causing immediate rash, facial/tongue/throat swelling, SOB or lightheadedness with hypotension: Unknown Has patient had a PCN reaction causing severe rash involving mucus membranes or skin necrosis: Unknown Has patient had a PCN reaction that required hospitalization: Unknown Has patient had a PCN reaction occurring within the last 10 years: Unknown If all of the above answers are "NO", then may proceed with Cephalosporin use.     Nsaids        CKD   Quinapril Hcl        elevated potassium   Sulfamethoxazole-Trimethoprim        decreased kidney function   Prednisone Other (See Comments)      Reaction unknown            Current Outpatient Medications  Medication Sig Dispense Refill   aspirin EC 81 MG tablet Take 1 tablet (81 mg total) by mouth daily. Swallow whole. (Patient taking differently: Take 81 mg by mouth at bedtime. Swallow whole.) 30 tablet 12   Cholecalciferol (VITAMIN D) 50 MCG (2000 UT) tablet Take 2,000 Units by mouth daily.  Cyanocobalamin (B-12) 5000 MCG CAPS Take 5,000 mcg by mouth daily.       ferrous sulfate 325 (65 FE) MG tablet Take 325 mg by mouth daily.       HUMALOG KWIKPEN 100 UNIT/ML KwikPen Inject 0-10 Units into the skin 3 (three) times daily as needed  (high blood sugar).       Insulin Glargine (LANTUS) 100 UNIT/ML Solostar Pen Inject 13 Units into the skin daily. (Patient taking differently: Inject 10 Units into the skin daily.) 15 mL 11   Menthol-Methyl Salicylate (SALONPAS PAIN RELIEF PATCH) PTCH Apply 1 patch topically daily as needed (patch).       metoprolol tartrate (LOPRESSOR) 50 MG tablet Take as directed prior to February 22nd CT scan 1 tablet 0   montelukast (SINGULAIR) 10 MG tablet Take 10 mg by mouth at bedtime.       Polyethyl Glycol-Propyl Glycol (SYSTANE OP) Place 1 drop into both eyes daily as needed (dry eyes).       protein supplement shake (PREMIER PROTEIN) LIQD Take 11 oz by mouth 2 (two) times daily.       simvastatin (ZOCOR) 40 MG tablet Take 40 mg by mouth at bedtime.        No current facility-administered medications for this visit.             Family History  Problem Relation Age of Onset   Cancer Mother     Heart Problems Father              Physical Exam: Small stature female Lungs: clear Card: RR with 3.6 sem Ext: no edema Neuro: no focal deficits         Diagnostic Studies & Laboratory data: I have personally reviewed the following studies and agree with the findings    TTE (11/2022) IMPRESSIONS     1. Left ventricular ejection fraction, by estimation, is 55 to 60%. The  left ventricle has normal function. The left ventricle has no regional  wall motion abnormalities. There is mild left ventricular hypertrophy.  Left ventricular diastolic parameters  are consistent with Grade I diastolic dysfunction (impaired relaxation).  Elevated left ventricular end-diastolic pressure.   2. Right ventricular systolic function is normal. The right ventricular  size is normal. There is mildly elevated pulmonary artery systolic  pressure. The estimated right ventricular systolic pressure is 0000000 mmHg.   3. The mitral valve is abnormal. Moderate mitral valve regurgitation. No  evidence of mitral  stenosis. Moderate mitral annular calcification.   4. The aortic valve is calcified. Aortic valve regurgitation is moderate.  Severe aortic valve stenosis. Aortic regurgitation PHT measures 365 msec.  Aortic valve area, by VTI measures 0.50 cm. Aortic valve mean gradient  measures 36.5 mmHg. Aortic valve  Vmax measures 4.00 m/s. Peak gradient 64 mmHg. DI is 0.20.   5. The inferior vena cava is normal in size with greater than 50%  respiratory variability, suggesting right atrial pressure of 3 mmHg.   Comparison(s): Changes from prior study are noted. 02/21/2022: LVEF 55-60%,  severe AS - mean gradient 35 mmHg.   FINDINGS   Left Ventricle: Left ventricular ejection fraction, by estimation, is 55  to 60%. The left ventricle has normal function. The left ventricle has no  regional wall motion abnormalities. The left ventricular internal cavity  size was normal in size. There is   mild left ventricular hypertrophy. Left ventricular diastolic parameters  are consistent with Grade I diastolic dysfunction (impaired relaxation).  Elevated left  ventricular end-diastolic pressure.   Right Ventricle: The right ventricular size is normal. No increase in  right ventricular wall thickness. Right ventricular systolic function is  normal. There is mildly elevated pulmonary artery systolic pressure. The  tricuspid regurgitant velocity is 3.16   m/s, and with an assumed right atrial pressure of 3 mmHg, the estimated  right ventricular systolic pressure is 0000000 mmHg.   Left Atrium: Left atrial size was normal in size.   Right Atrium: Right atrial size was normal in size.   Pericardium: There is no evidence of pericardial effusion.   Mitral Valve: The mitral valve is abnormal. There is moderate  calcification of the posterior mitral valve leaflet(s). Moderate mitral  annular calcification. Moderate mitral valve regurgitation. No evidence of  mitral valve stenosis.   Tricuspid Valve: The tricuspid  valve is grossly normal. Tricuspid valve  regurgitation is mild.   Aortic Valve: The aortic valve is calcified. Aortic valve regurgitation is  moderate. Aortic regurgitation PHT measures 365 msec. Severe aortic  stenosis is present. Aortic valve mean gradient measures 36.5 mmHg. Aortic  valve peak gradient measures 64.0  mmHg. Aortic valve area, by VTI measures 0.50 cm.   Pulmonic Valve: The pulmonic valve was grossly normal. Pulmonic valve  regurgitation is trivial.   Aorta: The aortic root and ascending aorta are structurally normal, with  no evidence of dilitation.   Venous: The inferior vena cava is normal in size with greater than 50%  respiratory variability, suggesting right atrial pressure of 3 mmHg.   IAS/Shunts: No atrial level shunt detected by color flow Doppler.   Additional Comments: A device lead is visualized.     LEFT VENTRICLE  PLAX 2D  LVIDd:         4.40 cm   Diastology  LVIDs:         3.20 cm   LV e' medial:    4.24 cm/s  LV PW:         1.30 cm   LV E/e' medial:  23.5  LV IVS:        1.00 cm   LV e' lateral:   4.24 cm/s  LVOT diam:     1.80 cm   LV E/e' lateral: 23.5  LV SV:         48  LV SV Index:   33  LVOT Area:     2.54 cm     RIGHT VENTRICLE             IVC  RV S prime:     12.00 cm/s  IVC diam: 1.10 cm  TAPSE (M-mode): 1.4 cm  RVSP:           42.9 mmHg   LEFT ATRIUM             Index        RIGHT ATRIUM           Index  LA diam:        4.00 cm 2.76 cm/m   RA Pressure: 3.00 mmHg  LA Vol (A2C):   49.1 ml 33.86 ml/m  RA Area:     8.51 cm  LA Vol (A4C):   37.0 ml 25.52 ml/m  RA Volume:   17.00 ml  11.72 ml/m  LA Biplane Vol: 44.6 ml 30.76 ml/m   AORTIC VALVE  AV Area (Vmax):    0.53 cm  AV Area (Vmean):   0.54 cm  AV Area (VTI):     0.50 cm  AV Vmax:           400.00 cm/s  AV Vmean:          281.500 cm/s  AV VTI:            0.967 m  AV Peak Grad:      64.0 mmHg  AV Mean Grad:      36.5 mmHg  LVOT Vmax:         84.00 cm/s  LVOT  Vmean:        60.200 cm/s  LVOT VTI:          0.189 m  LVOT/AV VTI ratio: 0.20  AI PHT:            365 msec    AORTA  Ao Root diam: 2.50 cm  Ao Asc diam:  2.20 cm   MITRAL VALVE                TRICUSPID VALVE  MV Area (PHT): 4.26 cm     TR Peak grad:   39.9 mmHg  MV Decel Time: 178 msec     TR Vmax:        316.00 cm/s  MV E velocity: 99.70 cm/s   Estimated RAP:  3.00 mmHg  MV A velocity: 138.00 cm/s  RVSP:           42.9 mmHg  MV E/A ratio:  0.72                              SHUNTS                              Systemic VTI:  0.19 m                              Systemic Diam: 1.80 cm    CATH (12/2022)   Conclusion   1.  Minimal obstructive coronary artery disease. 2.  Mean RA pressure 5 mmHg, RV pressure of 41/-4 with RV end-diastolic pressure of 7 mmHg, mean wedge pressure of 8 mmHg, and mean PA pressure 20 mmHg with a Fick cardiac output of 4.7 L/min and a Fick cardiac index of 3.3 L/min/m.    Recent Radiology Findings:   CTA (12/2022)  FINDINGS: Aortic Valve: Tri leaflet calcified aortic valve with restricted leaflet motion Calcium score somewhat low at 765   Aorta: Normal arch vessels no aneurysm Moderate calcific atherosclerosis   Sino-tubular Junction: 21.5 mm with large area of circumferential calcium extending from RCA ostium about 1/3 of circumference   Ascending Thoracic Aorta: 24.5 mm   Aortic Arch: 22 mm   Descending Thoracic Aorta: 17 mm   Sinus of Valsalva Measurements:   Non-coronary: 21.4 mm  Height 15.2 mm   Right - coronary: 22.1 mm  Height 14.9 mm   Left -   coronary: 21.8 mm  Height 15.5 mm   Coronary Artery Height above Annulus:   Left Main: 11 mm above annulus   Right Coronary: 11.4 mm above annulus   Virtual Basal Annulus Measurements: Done in 30% phase   Maximum / Minimum Diameter: 20.7 mm x 18.4 mm Average diameter 20 mm   Perimeter: 64.1   Area: 315 mm2   Coronary Arteries: Sufficient height above annulus for deployment    Optimum Fluoroscopic Angle for Delivery: RAO 6 Caudal 8  degrees   IMPRESSION: 1. Calcified tri leaflet aortic valve with small annular area of 315 mm2 most suitable for a 23 mm Medtronic Evolut valve   2.  Coronary arteries sufficient height for deployment   3.  Optimum angiographic angle for deployment RAO 6 Caudal 8 degrees   4.  Membranous septal length 6.9 mm   5.  Aortic Valve Calcium score 765   6.  Severe posterior mitral annular calcification   7.  Pacing wires noted in RA/RV       Recent Lab Findings: Recent Labs       Lab Results  Component Value Date    WBC 6.6 01/01/2023    HGB 10.9 (L) 01/11/2023    HCT 32.0 (L) 01/11/2023    PLT 220 01/01/2023    GLUCOSE 85 01/01/2023    ALT 16 10/14/2020    AST 21 10/14/2020    NA 139 01/11/2023    K 4.1 01/11/2023    CL 100 01/01/2023    CREATININE 0.82 01/01/2023    BUN 15 01/01/2023    CO2 30 (H) 01/01/2023    TSH 0.910 11/19/2021    INR 0.9 02/07/2019    HGBA1C 9.4 (H) 10/14/2020            Assessment / Plan:     82 yo female with NYHA class II symptoms of severe AS and with normal LV function with minimal CAD. Pt with history of ICH following fall and PPM. She would benefit from AVR and with her age and comorbidities would best be served with TAVR. She has TF access and a 6m Sapien for her size would be sufficient. She will not be a bailout candidate.

## 2023-02-06 ENCOUNTER — Other Ambulatory Visit: Payer: Self-pay

## 2023-02-06 ENCOUNTER — Inpatient Hospital Stay (HOSPITAL_COMMUNITY): Payer: Medicare PPO

## 2023-02-06 ENCOUNTER — Inpatient Hospital Stay (HOSPITAL_COMMUNITY): Payer: Medicare PPO | Admitting: Physician Assistant

## 2023-02-06 ENCOUNTER — Inpatient Hospital Stay (HOSPITAL_COMMUNITY): Payer: Medicare PPO | Admitting: Certified Registered Nurse Anesthetist

## 2023-02-06 ENCOUNTER — Encounter (HOSPITAL_COMMUNITY)
Admission: RE | Disposition: A | Discharge status: Home / Self Care | Payer: Self-pay | Source: Home / Self Care | Attending: Internal Medicine

## 2023-02-06 ENCOUNTER — Other Ambulatory Visit: Payer: Self-pay | Admitting: Cardiology

## 2023-02-06 ENCOUNTER — Encounter (HOSPITAL_COMMUNITY): Payer: Self-pay | Admitting: Internal Medicine

## 2023-02-06 ENCOUNTER — Inpatient Hospital Stay (HOSPITAL_COMMUNITY)
Admission: RE | Admit: 2023-02-06 | Discharge: 2023-02-07 | DRG: 267 | Disposition: A | Discharge status: Home / Self Care | Payer: Medicare PPO | Attending: Internal Medicine | Admitting: Internal Medicine

## 2023-02-06 DIAGNOSIS — E1022 Type 1 diabetes mellitus with diabetic chronic kidney disease: Secondary | ICD-10-CM | POA: Diagnosis present

## 2023-02-06 DIAGNOSIS — Z96611 Presence of right artificial shoulder joint: Secondary | ICD-10-CM | POA: Diagnosis present

## 2023-02-06 DIAGNOSIS — M1990 Unspecified osteoarthritis, unspecified site: Secondary | ICD-10-CM | POA: Diagnosis present

## 2023-02-06 DIAGNOSIS — I080 Rheumatic disorders of both mitral and aortic valves: Principal | ICD-10-CM | POA: Diagnosis present

## 2023-02-06 DIAGNOSIS — I6523 Occlusion and stenosis of bilateral carotid arteries: Secondary | ICD-10-CM | POA: Diagnosis not present

## 2023-02-06 DIAGNOSIS — Z006 Encounter for examination for normal comparison and control in clinical research program: Secondary | ICD-10-CM

## 2023-02-06 DIAGNOSIS — I129 Hypertensive chronic kidney disease with stage 1 through stage 4 chronic kidney disease, or unspecified chronic kidney disease: Secondary | ICD-10-CM | POA: Diagnosis not present

## 2023-02-06 DIAGNOSIS — Z950 Presence of cardiac pacemaker: Secondary | ICD-10-CM | POA: Diagnosis not present

## 2023-02-06 DIAGNOSIS — Z794 Long term (current) use of insulin: Secondary | ICD-10-CM | POA: Diagnosis not present

## 2023-02-06 DIAGNOSIS — J45909 Unspecified asthma, uncomplicated: Secondary | ICD-10-CM | POA: Diagnosis present

## 2023-02-06 DIAGNOSIS — I442 Atrioventricular block, complete: Secondary | ICD-10-CM | POA: Diagnosis not present

## 2023-02-06 DIAGNOSIS — M810 Age-related osteoporosis without current pathological fracture: Secondary | ICD-10-CM | POA: Diagnosis present

## 2023-02-06 DIAGNOSIS — N1831 Chronic kidney disease, stage 3a: Secondary | ICD-10-CM | POA: Diagnosis not present

## 2023-02-06 DIAGNOSIS — I2510 Atherosclerotic heart disease of native coronary artery without angina pectoris: Secondary | ICD-10-CM | POA: Diagnosis present

## 2023-02-06 DIAGNOSIS — Z809 Family history of malignant neoplasm, unspecified: Secondary | ICD-10-CM

## 2023-02-06 DIAGNOSIS — D649 Anemia, unspecified: Secondary | ICD-10-CM | POA: Diagnosis not present

## 2023-02-06 DIAGNOSIS — I779 Disorder of arteries and arterioles, unspecified: Secondary | ICD-10-CM | POA: Diagnosis present

## 2023-02-06 DIAGNOSIS — I10 Essential (primary) hypertension: Secondary | ICD-10-CM | POA: Diagnosis not present

## 2023-02-06 DIAGNOSIS — I350 Nonrheumatic aortic (valve) stenosis: Secondary | ICD-10-CM

## 2023-02-06 DIAGNOSIS — Z881 Allergy status to other antibiotic agents status: Secondary | ICD-10-CM

## 2023-02-06 DIAGNOSIS — E109 Type 1 diabetes mellitus without complications: Secondary | ICD-10-CM | POA: Diagnosis present

## 2023-02-06 DIAGNOSIS — Z952 Presence of prosthetic heart valve: Secondary | ICD-10-CM

## 2023-02-06 DIAGNOSIS — H409 Unspecified glaucoma: Secondary | ICD-10-CM | POA: Diagnosis present

## 2023-02-06 DIAGNOSIS — Z888 Allergy status to other drugs, medicaments and biological substances status: Secondary | ICD-10-CM | POA: Diagnosis not present

## 2023-02-06 DIAGNOSIS — Z886 Allergy status to analgesic agent status: Secondary | ICD-10-CM

## 2023-02-06 DIAGNOSIS — Z7982 Long term (current) use of aspirin: Secondary | ICD-10-CM

## 2023-02-06 DIAGNOSIS — Z79899 Other long term (current) drug therapy: Secondary | ICD-10-CM | POA: Diagnosis not present

## 2023-02-06 DIAGNOSIS — E785 Hyperlipidemia, unspecified: Secondary | ICD-10-CM | POA: Diagnosis present

## 2023-02-06 DIAGNOSIS — Z882 Allergy status to sulfonamides status: Secondary | ICD-10-CM

## 2023-02-06 DIAGNOSIS — Z8249 Family history of ischemic heart disease and other diseases of the circulatory system: Secondary | ICD-10-CM | POA: Diagnosis not present

## 2023-02-06 DIAGNOSIS — N183 Chronic kidney disease, stage 3 unspecified: Secondary | ICD-10-CM | POA: Diagnosis present

## 2023-02-06 HISTORY — PX: INTRAOPERATIVE TRANSTHORACIC ECHOCARDIOGRAM: SHX6523

## 2023-02-06 HISTORY — DX: Presence of prosthetic heart valve: Z95.2

## 2023-02-06 HISTORY — PX: TRANSCATHETER AORTIC VALVE REPLACEMENT, TRANSFEMORAL: SHX6400

## 2023-02-06 LAB — GLUCOSE, CAPILLARY
Glucose-Capillary: 119 mg/dL — ABNORMAL HIGH (ref 70–99)
Glucose-Capillary: 135 mg/dL — ABNORMAL HIGH (ref 70–99)
Glucose-Capillary: 138 mg/dL — ABNORMAL HIGH (ref 70–99)
Glucose-Capillary: 388 mg/dL — ABNORMAL HIGH (ref 70–99)
Glucose-Capillary: 44 mg/dL — CL (ref 70–99)
Glucose-Capillary: 55 mg/dL — ABNORMAL LOW (ref 70–99)
Glucose-Capillary: 82 mg/dL (ref 70–99)

## 2023-02-06 LAB — POCT I-STAT, CHEM 8
BUN: 12 mg/dL (ref 8–23)
BUN: 14 mg/dL (ref 8–23)
BUN: 13 mg/dL (ref 8–23)
BUN: 12 mg/dL (ref 8–23)
BUN: 12 mg/dL (ref 8–23)
Calcium, Ion: 1.1 mmol/L — ABNORMAL LOW (ref 1.15–1.40)
Calcium, Ion: 1.11 mmol/L — ABNORMAL LOW (ref 1.15–1.40)
Calcium, Ion: 1.14 mmol/L — ABNORMAL LOW (ref 1.15–1.40)
Calcium, Ion: 1.16 mmol/L (ref 1.15–1.40)
Calcium, Ion: 1.18 mmol/L (ref 1.15–1.40)
Chloride: 96 mmol/L — ABNORMAL LOW (ref 98–111)
Chloride: 99 mmol/L (ref 98–111)
Chloride: 97 mmol/L — ABNORMAL LOW (ref 98–111)
Chloride: 100 mmol/L (ref 98–111)
Chloride: 99 mmol/L (ref 98–111)
Creatinine, Ser: 0.5 mg/dL (ref 0.44–1.00)
Creatinine, Ser: 0.7 mg/dL (ref 0.44–1.00)
Creatinine, Ser: 0.5 mg/dL (ref 0.44–1.00)
Creatinine, Ser: 0.5 mg/dL (ref 0.44–1.00)
Creatinine, Ser: 0.6 mg/dL (ref 0.44–1.00)
Glucose, Bld: 327 mg/dL — ABNORMAL HIGH (ref 70–99)
Glucose, Bld: 386 mg/dL — ABNORMAL HIGH (ref 70–99)
Glucose, Bld: 329 mg/dL — ABNORMAL HIGH (ref 70–99)
Glucose, Bld: 170 mg/dL — ABNORMAL HIGH (ref 70–99)
Glucose, Bld: 228 mg/dL — ABNORMAL HIGH (ref 70–99)
HCT: 30 % — ABNORMAL LOW (ref 36.0–46.0)
HCT: 35 % — ABNORMAL LOW (ref 36.0–46.0)
HCT: 31 % — ABNORMAL LOW (ref 36.0–46.0)
HCT: 30 % — ABNORMAL LOW (ref 36.0–46.0)
HCT: 31 % — ABNORMAL LOW (ref 36.0–46.0)
Hemoglobin: 10.2 g/dL — ABNORMAL LOW (ref 12.0–15.0)
Hemoglobin: 11.9 g/dL — ABNORMAL LOW (ref 12.0–15.0)
Hemoglobin: 10.5 g/dL — ABNORMAL LOW (ref 12.0–15.0)
Hemoglobin: 10.2 g/dL — ABNORMAL LOW (ref 12.0–15.0)
Hemoglobin: 10.5 g/dL — ABNORMAL LOW (ref 12.0–15.0)
Potassium: 3.5 mmol/L (ref 3.5–5.1)
Potassium: 3.8 mmol/L (ref 3.5–5.1)
Potassium: 3.8 mmol/L (ref 3.5–5.1)
Potassium: 3.4 mmol/L — ABNORMAL LOW (ref 3.5–5.1)
Potassium: 3.6 mmol/L (ref 3.5–5.1)
Sodium: 136 mmol/L (ref 135–145)
Sodium: 138 mmol/L (ref 135–145)
Sodium: 137 mmol/L (ref 135–145)
Sodium: 138 mmol/L (ref 135–145)
Sodium: 139 mmol/L (ref 135–145)
TCO2: 26 mmol/L (ref 22–32)
TCO2: 27 mmol/L (ref 22–32)
TCO2: 29 mmol/L (ref 22–32)
TCO2: 26 mmol/L (ref 22–32)
TCO2: 26 mmol/L (ref 22–32)

## 2023-02-06 LAB — ECHOCARDIOGRAM LIMITED
AR max vel: 0.65 cm2
AV Area VTI: 0.66 cm2
AV Area mean vel: 0.65 cm2
AV Mean grad: 22.5 mmHg
AV Peak grad: 29.6 mmHg
Ao pk vel: 2.72 m/s
MV VTI: 1.55 cm2

## 2023-02-06 SURGERY — IMPLANTATION, AORTIC VALVE, TRANSCATHETER, FEMORAL APPROACH
Anesthesia: Monitor Anesthesia Care | Site: Chest

## 2023-02-06 MED ORDER — VERAPAMIL HCL 2.5 MG/ML IV SOLN
INTRAVENOUS | Status: AC
  Filled 2023-02-06: qty 2

## 2023-02-06 MED ORDER — SODIUM CHLORIDE 0.9 % IV SOLN: INTRAVENOUS | Status: DC

## 2023-02-06 MED ORDER — ORAL CARE MOUTH RINSE: 15.0000 mL | Freq: Once | OROMUCOSAL | Status: AC

## 2023-02-06 MED ORDER — CHLORHEXIDINE GLUCONATE 0.12 % MT SOLN
15.0000 mL | Freq: Once | OROMUCOSAL | Status: DC
  Filled 2023-02-06: qty 15

## 2023-02-06 MED ORDER — LACTATED RINGERS IV SOLN: INTRAVENOUS | Status: DC

## 2023-02-06 MED ORDER — ACETAMINOPHEN 500 MG PO TABS
1000.0000 mg | ORAL_TABLET | Freq: Once | ORAL | Status: AC
  Administered 2023-02-06: 1000 mg via ORAL
  Filled 2023-02-06: qty 2

## 2023-02-06 MED ORDER — ACETAMINOPHEN 325 MG PO TABS: 650.0000 mg | ORAL_TABLET | Freq: Four times a day (QID) | ORAL | Status: DC | PRN

## 2023-02-06 MED ORDER — CHLORHEXIDINE GLUCONATE 4 % EX LIQD: 60.0000 mL | Freq: Once | CUTANEOUS | Status: DC

## 2023-02-06 MED ORDER — SODIUM CHLORIDE 0.9% FLUSH: 3.0000 mL | Freq: Two times a day (BID) | INTRAVENOUS | Status: DC

## 2023-02-06 MED ORDER — LACTATED RINGERS IV SOLN: INTRAVENOUS | Status: DC | PRN

## 2023-02-06 MED ORDER — ACETAMINOPHEN 650 MG RE SUPP: 650.0000 mg | Freq: Four times a day (QID) | RECTAL | Status: DC | PRN

## 2023-02-06 MED ORDER — DEXTROSE 50 % IV SOLN
INTRAVENOUS | Status: AC
  Filled 2023-02-06: qty 50

## 2023-02-06 MED ORDER — INSULIN REGULAR(HUMAN) IN NACL 100-0.9 UT/100ML-% IV SOLN
INTRAVENOUS | Status: DC | PRN
  Administered 2023-02-06: 4.8 [IU]/h via INTRAVENOUS

## 2023-02-06 MED ORDER — CEFAZOLIN SODIUM-DEXTROSE 2-4 GM/100ML-% IV SOLN
2.0000 g | Freq: Three times a day (TID) | INTRAVENOUS | Status: AC
  Administered 2023-02-06 (×2): 2 g via INTRAVENOUS
  Filled 2023-02-06 (×2): qty 100

## 2023-02-06 MED ORDER — ASPIRIN 81 MG PO TBEC
81.0000 mg | DELAYED_RELEASE_TABLET | Freq: Every day | ORAL | Status: DC
  Administered 2023-02-06: 81 mg via ORAL
  Filled 2023-02-06: qty 1

## 2023-02-06 MED ORDER — HEPARIN 6000 UNIT IRRIGATION SOLUTION
Status: DC | PRN
  Administered 2023-02-06: 3

## 2023-02-06 MED ORDER — CHLORHEXIDINE GLUCONATE 4 % EX LIQD: 30.0000 mL | CUTANEOUS | Status: DC

## 2023-02-06 MED ORDER — PROPOFOL 10 MG/ML IV BOLUS
INTRAVENOUS | Status: DC | PRN
  Administered 2023-02-06: 20 mg via INTRAVENOUS

## 2023-02-06 MED ORDER — FENTANYL CITRATE (PF) 250 MCG/5ML IJ SOLN
INTRAMUSCULAR | Status: AC
  Filled 2023-02-06: qty 5

## 2023-02-06 MED ORDER — SODIUM CHLORIDE 0.9% FLUSH: 3.0000 mL | INTRAVENOUS | Status: DC | PRN

## 2023-02-06 MED ORDER — PROPOFOL 10 MG/ML IV BOLUS
INTRAVENOUS | Status: AC
  Filled 2023-02-06: qty 20

## 2023-02-06 MED ORDER — PROTAMINE SULFATE 10 MG/ML IV SOLN
INTRAVENOUS | Status: DC | PRN
  Administered 2023-02-06: 20 mg via INTRAVENOUS
  Administered 2023-02-06: 50 mg via INTRAVENOUS

## 2023-02-06 MED ORDER — 0.9 % SODIUM CHLORIDE (POUR BTL) OPTIME
TOPICAL | Status: DC | PRN
  Administered 2023-02-06: 1000 mL

## 2023-02-06 MED ORDER — HEPARIN 6000 UNIT IRRIGATION SOLUTION
Status: AC
  Filled 2023-02-06: qty 1500

## 2023-02-06 MED ORDER — FENTANYL CITRATE (PF) 250 MCG/5ML IJ SOLN
INTRAMUSCULAR | Status: DC | PRN
  Administered 2023-02-06 (×2): 25 ug via INTRAVENOUS
  Administered 2023-02-06: 50 ug via INTRAVENOUS

## 2023-02-06 MED ORDER — INSULIN ASPART 100 UNIT/ML IJ SOLN
0.0000 [IU] | INTRAMUSCULAR | Status: DC
  Administered 2023-02-06: 2 [IU] via SUBCUTANEOUS
  Administered 2023-02-06: 20 [IU] via SUBCUTANEOUS
  Administered 2023-02-07: 12 [IU] via SUBCUTANEOUS
  Administered 2023-02-07 (×2): 2 [IU] via SUBCUTANEOUS

## 2023-02-06 MED ORDER — DEXTROSE 50 % IV SOLN
INTRAVENOUS | Status: DC | PRN
  Administered 2023-02-06: 25 mL via INTRAVENOUS
  Administered 2023-02-06: 50 mL via INTRAVENOUS

## 2023-02-06 MED ORDER — SODIUM CHLORIDE 0.9 % IV SOLN: 250.0000 mL | INTRAVENOUS | Status: DC

## 2023-02-06 MED ORDER — CHLORHEXIDINE GLUCONATE 0.12 % MT SOLN
15.0000 mL | Freq: Once | OROMUCOSAL | Status: AC
  Administered 2023-02-06: 15 mL via OROMUCOSAL

## 2023-02-06 MED ORDER — SODIUM CHLORIDE 0.9 % IV SOLN: 250.0000 mL | INTRAVENOUS | Status: DC | PRN

## 2023-02-06 MED ORDER — LIDOCAINE HCL 1 % IJ SOLN
INTRAMUSCULAR | Status: DC | PRN
  Administered 2023-02-06: 20 mL

## 2023-02-06 MED ORDER — SODIUM CHLORIDE 0.9 % IV SOLN: INTRAVENOUS | Status: AC

## 2023-02-06 MED ORDER — VERAPAMIL HCL 2.5 MG/ML IV SOLN
INTRAVENOUS | Status: DC | PRN
  Administered 2023-02-06: 2.5 mg via INTRAVENOUS

## 2023-02-06 MED ORDER — NOREPINEPHRINE 4 MG/250ML-% IV SOLN
INTRAVENOUS | Status: DC | PRN
  Administered 2023-02-06: 1 ug/min via INTRAVENOUS

## 2023-02-06 MED ORDER — HEPARIN SODIUM (PORCINE) 1000 UNIT/ML IJ SOLN
INTRAMUSCULAR | Status: DC | PRN
  Administered 2023-02-06: 7000 [IU] via INTRAVENOUS

## 2023-02-06 MED ORDER — NITROGLYCERIN IN D5W 200-5 MCG/ML-% IV SOLN
0.0000 ug/min | INTRAVENOUS | Status: DC
  Filled 2023-02-06: qty 250

## 2023-02-06 MED ORDER — LIDOCAINE HCL 1 % IJ SOLN
INTRAMUSCULAR | Status: AC
  Filled 2023-02-06: qty 20

## 2023-02-06 MED ORDER — MORPHINE SULFATE (PF) 2 MG/ML IV SOLN: 1.0000 mg | INTRAVENOUS | Status: DC | PRN

## 2023-02-06 MED ORDER — OXYCODONE HCL 5 MG PO TABS: 5.0000 mg | ORAL_TABLET | ORAL | Status: DC | PRN

## 2023-02-06 MED ORDER — ONDANSETRON HCL 4 MG/2ML IJ SOLN
INTRAMUSCULAR | Status: DC | PRN
  Administered 2023-02-06: 4 mg via INTRAVENOUS

## 2023-02-06 MED ORDER — SIMVASTATIN 20 MG PO TABS
40.0000 mg | ORAL_TABLET | Freq: Every day | ORAL | Status: DC
  Administered 2023-02-06: 40 mg via ORAL
  Filled 2023-02-06: qty 2

## 2023-02-06 SURGICAL SUPPLY — 74 items
ADH SKN CLS APL DERMABOND .7 (GAUZE/BANDAGES/DRESSINGS) ×2
APL PRP STRL LF DISP 70% ISPRP (MISCELLANEOUS) ×2
BAG COUNTER SPONGE SURGICOUNT (BAG) ×3 IMPLANT
BAG DECANTER FOR FLEXI CONT (MISCELLANEOUS) IMPLANT
BAG SPNG CNTER NS LX DISP (BAG) ×2
BLADE CLIPPER SURG (BLADE) IMPLANT
BLADE STERNUM SYSTEM 6 (BLADE) IMPLANT
CABLE ADAPT CONN TEMP 6FT (ADAPTER) ×3 IMPLANT
CANISTER SUCT 3000ML PPV (MISCELLANEOUS) IMPLANT
CATH DIAG EXPO 6F AL1 (CATHETERS) IMPLANT
CATH DIAG EXPO 6F FR4 (CATHETERS) IMPLANT
CATH DIAG EXPO 6F VENT PIG 145 (CATHETERS) ×6 IMPLANT
CATH S G BIP PACING (CATHETERS) ×3 IMPLANT
CHLORAPREP W/TINT 26 (MISCELLANEOUS) ×3 IMPLANT
CNTNR URN SCR LID CUP LEK RST (MISCELLANEOUS) ×6 IMPLANT
CONT SPEC 4OZ STRL OR WHT (MISCELLANEOUS) ×4
COVER BACK TABLE 80X110 HD (DRAPES) IMPLANT
DERMABOND ADVANCED .7 DNX12 (GAUZE/BANDAGES/DRESSINGS) ×3 IMPLANT
DEVICE CLOSURE PERCLS PRGLD 6F (VASCULAR PRODUCTS) ×6 IMPLANT
DRAPE BRACHIAL (DRAPES) IMPLANT
DRSG TEGADERM 4X4.5 CHG (GAUZE/BANDAGES/DRESSINGS) IMPLANT
DRSG TEGADERM 4X4.75 (GAUZE/BANDAGES/DRESSINGS) ×6 IMPLANT
ELECT REM PT RETURN 9FT ADLT (ELECTROSURGICAL) ×2
ELECTRODE REM PT RTRN 9FT ADLT (ELECTROSURGICAL) ×3 IMPLANT
GAUZE 4X4 16PLY ~~LOC~~+RFID DBL (SPONGE) IMPLANT
GAUZE SPONGE 4X4 12PLY STRL (GAUZE/BANDAGES/DRESSINGS) ×3 IMPLANT
GLIDEWIRE ADV .035X260CM (WIRE) IMPLANT
GLOVE BIO SURGEON STRL SZ7 (GLOVE) IMPLANT
GLOVE BIO SURGEON STRL SZ7.5 (GLOVE) IMPLANT
GLOVE ECLIPSE 7.0 STRL STRAW (GLOVE) ×3 IMPLANT
GLOVE ECLIPSE 7.5 STRL STRAW (GLOVE) IMPLANT
GOWN STRL REUS W/ TWL LRG LVL3 (GOWN DISPOSABLE) ×3 IMPLANT
GOWN STRL REUS W/ TWL XL LVL3 (GOWN DISPOSABLE) IMPLANT
GOWN STRL REUS W/TWL LRG LVL3 (GOWN DISPOSABLE) ×2
GOWN STRL REUS W/TWL XL LVL3 (GOWN DISPOSABLE)
GUIDEWIRE SAF TJ AMPL .035X180 (WIRE) ×3 IMPLANT
GUIDEWIRE SAFE TJ AMPLATZ EXST (WIRE) ×3 IMPLANT
KIT BASIN OR (CUSTOM PROCEDURE TRAY) ×3 IMPLANT
KIT HEART LEFT (KITS) ×3 IMPLANT
KIT SAPIAN 3 ULTRA RESILIA 20 (Valve) IMPLANT
KIT TURNOVER KIT B (KITS) ×3 IMPLANT
NDL 18GX1X1/2 (RX/OR ONLY) (NEEDLE) IMPLANT
NEEDLE 18GX1X1/2 (RX/OR ONLY) (NEEDLE) ×2 IMPLANT
NS IRRIG 1000ML POUR BTL (IV SOLUTION) ×3 IMPLANT
PACK ENDO MINOR (CUSTOM PROCEDURE TRAY) ×3 IMPLANT
PAD ARMBOARD 7.5X6 YLW CONV (MISCELLANEOUS) ×6 IMPLANT
PAD ELECT DEFIB RADIOL ZOLL (MISCELLANEOUS) ×3 IMPLANT
PERCLOSE PROGLIDE 6F (VASCULAR PRODUCTS) ×4
POSITIONER HEAD DONUT 9IN (MISCELLANEOUS) ×3 IMPLANT
SET MICROPUNCTURE 5F STIFF (MISCELLANEOUS) ×3 IMPLANT
SHEATH BRITE TIP 7FR 35CM (SHEATH) ×3 IMPLANT
SHEATH PINNACLE 6F 10CM (SHEATH) ×3 IMPLANT
SHEATH PINNACLE 8F 10CM (SHEATH) ×3 IMPLANT
SLEEVE REPOSITIONING LENGTH 30 (MISCELLANEOUS) ×3 IMPLANT
SPIKE FLUID TRANSFER (MISCELLANEOUS) ×3 IMPLANT
SPONGE T-LAP 18X18 ~~LOC~~+RFID (SPONGE) ×3 IMPLANT
STOPCOCK MORSE 400PSI 3WAY (MISCELLANEOUS) ×6 IMPLANT
SUT PROLENE 6 0 C 1 30 (SUTURE) IMPLANT
SUT SILK  1 MH (SUTURE) ×2
SUT SILK 1 MH (SUTURE) ×3 IMPLANT
SUT SILK 2 0 SH (SUTURE) IMPLANT
SYR 3ML LL SCALE MARK (SYRINGE) IMPLANT
SYR 50ML LL SCALE MARK (SYRINGE) ×3 IMPLANT
SYR BULB IRRIG 60ML STRL (SYRINGE) IMPLANT
SYR MEDRAD MARK V 150ML (SYRINGE) IMPLANT
TOWEL GREEN STERILE (TOWEL DISPOSABLE) ×6 IMPLANT
TRANSDUCER W/STOPCOCK (MISCELLANEOUS) ×6 IMPLANT
TRAY CATH LUMEN 1 20CM STRL (SET/KITS/TRAYS/PACK) IMPLANT
TRAY FOLEY SLVR 14FR TEMP STAT (SET/KITS/TRAYS/PACK) IMPLANT
TUBE SUCT INTRACARD DLP 20F (MISCELLANEOUS) IMPLANT
TUBING INJECTOR 48 (MISCELLANEOUS) IMPLANT
WIRE EMERALD 3MM-J .035X150CM (WIRE) ×3 IMPLANT
WIRE EMERALD 3MM-J .035X260CM (WIRE) ×3 IMPLANT
WIRE EMERALD ST .035X260CM (WIRE) ×6 IMPLANT

## 2023-02-06 NOTE — Progress Notes (Signed)
    Site area: left groin  Site Prior to Removal:  Level 0  Pressure Applied For 15 MINUTES    Minutes Beginning at 1129  Manual:   Yes.    Patient Status During Pull:  Stable  Post Pull Groin Site:  Level 0  Post Pull Instructions Given:  Yes.    Post Pull Pulses Present:  Yes.    Dressing Applied:  Yes.    Comments:  bed rest started at 1145 X 4 hr.

## 2023-02-06 NOTE — Progress Notes (Signed)
USGPIV order placed due to vasopressor initiation. Primary RN stated it was a standing order and the patient will not be started on pressors at this time. Will place new consult if needed. Current PIV access stable.  Latronda Spink Lorita Officer, RN

## 2023-02-06 NOTE — Interval H&P Note (Signed)
History and Physical Interval Note:  02/06/2023 6:36 AM  Danielle Clarke  has presented today for surgery, with the diagnosis of Severe Aortic Stenosis.  The various methods of treatment have been discussed with the patient and family. After consideration of risks, benefits and other options for treatment, the patient has consented to  Procedure(s): Transcatheter Aortic Valve Replacement, Transfemoral (N/A) INTRAOPERATIVE TRANSTHORACIC ECHOCARDIOGRAM (N/A) as a surgical intervention.  The patient's history has been reviewed, patient examined, no change in status, stable for surgery.  I have reviewed the patient's chart and labs.  Questions were answered to the patient's satisfaction.     Coralie Common

## 2023-02-06 NOTE — Progress Notes (Signed)
  [facility] VALVE TEAM  Patient doing well s/p TAVR. She is hemodynamically stable. Groin sites stable. Right radial stable as well with TR band in place. ECG with no high grade block, A pacing. Plan to DC arterial line and transfer to 4E.  Plan for early ambulation after bedrest completed and hopeful discharge over the next 24-48 hours.   Kathyrn Drown NP-C Structural Heart Team  Pager: [phone] Phone: [phone]

## 2023-02-06 NOTE — Anesthesia Preprocedure Evaluation (Signed)
Anesthesia Evaluation  Patient identified by MRN, date of birth, ID band Patient awake    Reviewed: Allergy & Precautions, NPO status , Patient's Chart, lab work & pertinent test results  Airway Mallampati: II  TM Distance: >3 FB Neck ROM: Full    Dental  (+) Dental Advisory Given   Pulmonary asthma    breath sounds clear to auscultation       Cardiovascular hypertension, Pt. on medications + dysrhythmias + pacemaker + Valvular Problems/Murmurs AS  Rhythm:Regular Rate:Normal     Neuro/Psych negative neurological ROS     GI/Hepatic negative GI ROS, Neg liver ROS,,,  Endo/Other  diabetes, Type 1, Insulin Dependent    Renal/GU Renal disease     Musculoskeletal  (+) Arthritis ,    Abdominal   Peds  Hematology  (+) Blood dyscrasia, anemia   Anesthesia Other Findings   Reproductive/Obstetrics                             Anesthesia Physical Anesthesia Plan  ASA: 4  Anesthesia Plan: MAC   Post-op Pain Management: Tylenol PO (pre-op)*   Induction:   PONV Risk Score and Plan: 2 and Propofol infusion, Ondansetron and Treatment may vary due to age or medical condition  Airway Management Planned: Natural Airway and Simple Face Mask  Additional Equipment: None  Intra-op Plan:   Post-operative Plan:   Informed Consent: I have reviewed the patients History and Physical, chart, labs and discussed the procedure including the risks, benefits and alternatives for the proposed anesthesia with the patient or authorized representative who has indicated his/her understanding and acceptance.     Dental advisory given  Plan Discussed with: CRNA  Anesthesia Plan Comments:        Anesthesia Quick Evaluation

## 2023-02-06 NOTE — Anesthesia Procedure Notes (Signed)
Procedure Name: MAC Date/Time: 02/06/2023 8:08 AM  Performed by: Darletta Moll, CRNAPre-anesthesia Checklist: Patient identified, Emergency Drugs available, Suction available and Patient being monitored Patient Re-evaluated:Patient Re-evaluated prior to induction Oxygen Delivery Method: Nasal cannula

## 2023-02-06 NOTE — Op Note (Signed)
[facility]   MULTIDISCIPLINARY HEART VALVE TEAM   TAVR OPERATIVE NOTE   Date of Procedure:  02/06/2023  Preoperative Diagnosis: Severe Aortic Stenosis   Postoperative Diagnosis: Same   Procedure:   Transcatheter Aortic Valve Replacement - Percutaneous Right Transfemoral Approach  Edwards Sapien 3 Ultra THV (size 20 mm, model # [id number], serial [id number])   Co-Surgeons:  Coralie Common MD and Lenna Sciara, MD   Anesthesiologist:  Suzette Battiest MD  Echocardiographer:  Dr Owens Shark  Pre-operative Echo Findings: Severe aortic stenosis normal left ventricular systolic function  Post-operative Echo Findings: no paravalvular leak normal left ventricular systolic function   BRIEF CLINICAL NOTE AND INDICATIONS FOR SURGERY   82 yo female with NYHA class II symptoms of severe AS and with normal LV function with minimal CAD. Pt with history of ICH following fall and PPM. She would benefit from AVR and with her age and comorbidities would best be served with TAVR     DETAILS OF THE OPERATIVE PROCEDURE  PREPARATION:    The patient was brought to the operating room on the above mentioned date and appropriate monitoring was established by the anesthesia team. The patient was placed in the supine position on the operating table.  Intravenous antibiotics were administered. The patient was monitored closely throughout the procedure under conscious sedation.  Baseline transthoracic echocardiogram was performed. The patient's abdomen and both groins were prepped and draped in a sterile manner. A time out procedure was performed.   PERIPHERAL ACCESS:    Using the modified Seldinger technique, femoral arterial was obtained with placement of 6 Fr sheaths on the left side.  A pigtail diagnostic catheter was unable to be passed through the left arterial sheath. This was then used for arterial monitoring.  The right radial artery catheter placed by anesthesia was then  prepped and draped for access of the pigtail diagnostic catheter. It was placed to the aortic root and a series of aortograms confirmed the deployment angle.  TRANSFEMORAL ACCESS:   Percutaneous transfemoral access and sheath placement was performed using ultrasound guidance.  The right common femoral artery was cannulated using a micropuncture needle and appropriate location was verified using hand injection angiogram.  A pair of Abbott Perclose percutaneous closure devices were placed and a 6 French sheath replaced into the femoral artery.  The patient was heparinized systemically and ACT verified > 250 seconds.    A 14 Fr transfemoral E-sheath was introduced into the right  common femoral artery after progressively dilating over an Amplatz superstiff wire. An AL1 catheter was used to direct a advantage glide wire  across the native aortic valve into the left ventricle. This was exchanged out for a pigtail catheter and position was confirmed in the LV apex. Simultaneous LV and Ao pressures were recorded. LVEDP measured 60mHg. The pigtail catheter was exchanged for a Safari wire in the LV apex. The [facility] Hospital[facility] was used for direct LV pacing and  The pacemaker was tested to ensure stable lead placement and pacemaker capture.  BALLOON AORTIC VALVULOPLASTY:   Was not done   TRANSCATHETER HEART VALVE DEPLOYMENT:   An Edwards Sapien 3 Ultra transcatheter heart valve (size 20 mm) was prepared and crimped per manufacturer's guidelines, and the proper orientation of the valve is confirmed on the [employer]delivery system. The valve was advanced through the introducer sheath using normal technique until in an appropriate position in the abdominal aorta beyond the sheath tip. The balloon was then retracted and using the  fine-tuning wheel was centered on the valve. The valve was then advanced across the aortic arch using appropriate flexion of the catheter. The valve was carefully positioned across the  aortic valve annulus. The Commander catheter was retracted using normal technique. Once final position of the valve has been confirmed by angiographic assessment, the valve is deployed during rapid ventricular pacing to maintain systolic blood pressure < 50 mmHg and pulse pressure < 10 mmHg. The balloon inflation is held for >3 seconds after reaching full deployment volume. Once the balloon has fully deflated the balloon is retracted into the ascending aorta and valve function is assessed using echocardiography. There is felt to be no paravalvular leak and no central aortic insufficiency.  The patient's hemodynamic recovery following valve deployment is good.  The deployment balloon and guidewire are both removed.    PROCEDURE COMPLETION:   The sheath was removed and femoral artery closure performed.  Protamine was administered once femoral arterial repair was complete. An arteriogram of the right arterial access was performed and confirmed excellent closure of the site. The left arterial line was left to be used for monitoring and the right radial artery catheter removed and pressure applied for hemostasis. The patient tolerated the procedure well and is transported to the cath lab recovery area in stable condition. There were no immediate intraoperative complications. All sponge instrument and needle counts are verified correct at completion of the operation.   No blood products were administered during the operation.  The patient received a total of 100 mL of intravenous contrast during the procedure.   Coralie Common, MD 02/06/2023 10:04 AM

## 2023-02-06 NOTE — Op Note (Signed)
[facility]  TAVR OPERATIVE NOTE   Date of Procedure:  02/06/2023  Preoperative Diagnosis: Severe Aortic Stenosis   Postoperative Diagnosis: Same   Procedure:   Transcatheter Aortic Valve Replacement - Transfemoral Approach  Edwards Sapien 3 Resilia THV (size 20 mm, model # [id number], serial # [id number] )   Co-Surgeons:  Coralie Common, MD and Lenna Sciara, MD Anesthesiologist:  Suzette Battiest, MD  Echocardiographer:  Rudean Haskell, MD  Pre-operative Echo Findings: Severe aortic stenosis Normal left ventricular systolic function  Post-operative Echo Findings: No paravalvular leak Normal left ventricular systolic function  Left Heart Catheterization Findings: Left ventricular end-diastolic pressure of [id number]   BRIEF CLINICAL NOTE AND INDICATIONS FOR SURGERY  The patient is an 82 year old female with a history of symptomatic severe aortic stenosis, bradycardia status post permanent pacemaker, intracranial hemorrhage, type 2 diabetes on insulin, and hyperlipidemia who is evaluated in the outpatient setting due to ongoing dyspnea.  She is referred for elective transcatheter aortic valve replacement with a 20 mm SAPIEN 3 ultra valve from the right transfemoral approach.  During the course of the patient's preoperative work up they have been evaluated comprehensively by a multidisciplinary team of specialists coordinated through the [facility] in the [place].  They have been demonstrated to suffer from symptomatic severe aortic stenosis as noted above. The patient has been counseled extensively as to the relative risks and benefits of all options for the treatment of severe aortic stenosis including long term medical therapy, conventional surgery for aortic valve replacement, and transcatheter aortic valve replacement.  The patient has been independently evaluated by Dr. Cyndia Bent with CT surgery and they are felt to  be at high risk for conventional surgical aortic valve replacement. The surgeon indicated the patient would be a poor candidate for conventional surgery. Based upon review of all of the patient's preoperative diagnostic tests they are felt to be candidate for transcatheter aortic valve replacement using the transfemoral approach as an alternative to high risk conventional surgery.    Following the decision to proceed with transcatheter aortic valve replacement, a discussion has been held regarding what types of management strategies would be attempted intraoperatively in the event of life-threatening complications, including whether or not the patient would be considered a candidate for the use of cardiopulmonary bypass and/or conversion to open sternotomy for attempted surgical intervention.  The patient has been advised of a variety of complications that might develop peculiar to this approach including but not limited to risks of death, stroke, paravalvular leak, aortic dissection or other major vascular complications, aortic annulus rupture, device embolization, cardiac rupture or perforation, acute myocardial infarction, arrhythmia, heart block or bradycardia requiring permanent pacemaker placement, congestive heart failure, respiratory failure, renal failure, pneumonia, infection, other late complications related to structural valve deterioration or migration, or other complications that might ultimately cause a temporary or permanent loss of functional independence or other long term morbidity.  The patient provides full informed consent for the procedure as described and all questions were answered preoperatively.    DETAILS OF THE OPERATIVE PROCEDURE  PREPARATION:   The patient is brought to the operating room on the above mentioned date and central monitoring was established by the anesthesia team including placement of a radial arterial line. The patient is placed in the supine position on the  operating table.  Intravenous antibiotics are administered. Conscious sedation is used.   Baseline transthoracic echocardiogram was performed. The patient's chest, abdomen, both groins, and  both lower extremities are prepared and draped in a sterile manner. A time out procedure is performed.   PERIPHERAL ACCESS:   Using the modified Seldinger technique, femoral arterial with placement of a 6 Fr sheath in the artery and on the left side using u/s guidance.  A pigtail diagnostic catheter could not be advanced through the abdominal aorta due to its small caliber with the Edwards sheath in place.  For this reason the right radial arterial line was exchanged for 6 French femoral glide sheath.  5 mg of verapamil were administered through the sheath and aortography with a pigtail was performed through this access site.  The left femoral 6 French sheath was used for arterial pressure tracing by anesthesia.  Aortic root angiography was performed in order to determine the optimal angiographic angle for valve deployment.  TRANSFEMORAL ACCESS:  A micropuncture kit was used to gain access to the right femoral artery using u/s guidance. Position confirmed with angiography. Pre-closure with double ProGlide closure devices. The patient was heparinized systemically and ACT verified > 250 seconds.    A 14 Fr transfemoral E-sheath was introduced into the right femoral artery after progressively dilating over an Amplatz superstiff wire. A JR4 catheter was used to direct a Glidewire advantage across the native aortic valve into the left ventricle. This was exchanged out for a pigtail catheter and position was confirmed in the LV apex.  Simultaneous left ventricular, aortic, and left ventricular end-diastolic pressures were recorded.  The pigtail catheter was then exchanged for an Safari wire in the LV apex.  Direct LV pacing thresholds were assessed and found to be adequate.   TRANSCATHETER HEART VALVE DEPLOYMENT:  An  Edwards Sapien 3 THV (size 20 mm) was prepared and crimped per manufacturer's guidelines, and the proper orientation of the valve is confirmed on the [employer] delivery system. The valve was advanced through the introducer sheath using normal technique until in an appropriate position in the abdominal aorta beyond the sheath tip. The balloon was then retracted and using the fine-tuning wheel was centered on the valve. The valve was then advanced across the aortic arch using appropriate flexion of the catheter. The valve was carefully positioned across the aortic valve annulus. The Commander catheter was retracted using normal technique. Once final position of the valve has been confirmed by angiographic assessment, the valve is deployed while temporarily holding ventilation and during rapid ventricular pacing to maintain systolic blood pressure < 50 mmHg and pulse pressure < 10 mmHg. The balloon inflation is held for >3 seconds after reaching full deployment volume. Once the balloon has fully deflated the balloon is retracted into the ascending aorta and valve function is assessed using TTE. There is felt to be no paravalvular leak and no central aortic insufficiency.  The patient's hemodynamic recovery following valve deployment is good.  The deployment balloon and guidewire are both removed. Echo demostrated acceptable post-procedural gradients, stable mitral valve function, and no AI.   PROCEDURE COMPLETION:  The sheath was then removed and closure devices were completed. Protamine was administered once femoral arterial repair was complete.  Right radial access site was managed with a TR band.  The left femoral artery sheath will be left in place to act as an arterial line and pulled with manual pressure later.  The patient tolerated the procedure well and is transported to the surgical intensive care in stable condition. There were no immediate intraoperative complications. All sponge instrument and  needle counts are verified correct at  completion of the operation.   No blood products were administered during the operation.    Early Osmond MD 02/06/2023 10:11 AM

## 2023-02-06 NOTE — Anesthesia Postprocedure Evaluation (Signed)
Anesthesia Post Note  Patient: LILIA SHEPHERD  Procedure(s) Performed: Transcatheter Aortic Valve Replacement, Transfemoral (Chest) INTRAOPERATIVE TRANSTHORACIC ECHOCARDIOGRAM     Patient location during evaluation: PACU Anesthesia Type: MAC Level of consciousness: awake and alert Pain management: pain level controlled Vital Signs Assessment: post-procedure vital signs reviewed and stable Respiratory status: spontaneous breathing, nonlabored ventilation, respiratory function stable and patient connected to nasal cannula oxygen Cardiovascular status: stable and blood pressure returned to baseline Postop Assessment: no apparent nausea or vomiting Anesthetic complications: no  No notable events documented.  Last Vitals:  Vitals:   02/06/23 1430 02/06/23 1500  BP: (!) 109/43 (!) 132/46  Pulse: 68 69  Resp:    Temp:    SpO2: 100% 98%    Last Pain:  Vitals:   02/06/23 1240  TempSrc: Axillary  PainSc: 0-No pain                 Tiajuana Amass

## 2023-02-06 NOTE — Anesthesia Procedure Notes (Signed)
Arterial Line Insertion Start/End3/10/2023 7:07 AM, 02/06/2023 7:14 AM Performed by: Suzette Battiest, MD, anesthesiologist  Patient location: Pre-op. Preanesthetic checklist: patient identified, IV checked, site marked, risks and benefits discussed, surgical consent, monitors and equipment checked, pre-op evaluation, timeout performed and anesthesia consent Lidocaine 1% used for infiltration Right, radial was placed Catheter size: 20 G Hand hygiene performed  and maximum sterile barriers used   Attempts: 1 Procedure performed using ultrasound guided technique. Ultrasound Notes:anatomy identified, needle tip was noted to be adjacent to the nerve/plexus identified, no ultrasound evidence of intravascular and/or intraneural injection and image(s) printed for medical record Following insertion, dressing applied and Biopatch. Post procedure assessment: normal and unchanged  Post procedure complications: second provider assisted. Patient tolerated the procedure well with no immediate complications.

## 2023-02-06 NOTE — Discharge Summary (Incomplete)
[facility] TEAM  Discharge Summary    Patient ID: Danielle Clarke MRN: [id number]; DOB: [birth year]  Admit date: 02/06/2023 Discharge date: 02/07/2023  Primary Care Provider: Mckinley Jewel, MD  Primary Cardiologist: Early Osmond, MD / Dr. Lavonna Monarch, MD (TAVR)   Discharge Diagnoses    Principal Problem:   S/P TAVR (transcatheter aortic valve replacement) Active Problems:   Diabetes mellitus type 1 ([place])   Bilateral carotid artery disease (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   CKD (chronic kidney disease), stage III ([facility])  Allergies Allergies  Allergen Reactions   Cefaclor Anaphylaxis    Tolerated cephalexin in 2018   Tetracycline Anaphylaxis   Vancomycin Anaphylaxis   Augmentin [Amoxicillin-Pot Clavulanate] Other (See Comments)    Reaction unknown >> Anaphylaxis Has patient had a PCN reaction causing immediate rash, facial/tongue/throat swelling, SOB or lightheadedness with hypotension: Unknown Has patient had a PCN reaction causing severe rash involving mucus membranes or skin necrosis: Unknown Has patient had a PCN reaction that required hospitalization: Unknown Has patient had a PCN reaction occurring within the last 10 years: Unknown If all of the above answers are "NO", then may proceed with Cephalosporin use.    Nsaids     CKD   Quinapril Hcl     elevated potassium   Sulfamethoxazole-Trimethoprim     decreased kidney function   Prednisone Other (See Comments)    Reaction unknown    Diagnostic Studies/Procedures    [facility]  TAVR OPERATIVE NOTE     Date of Procedure:                02/06/2023   Preoperative Diagnosis:      Severe Aortic Stenosis    Postoperative Diagnosis:    Same    Procedure:        Transcatheter Aortic Valve Replacement - Transfemoral Approach             Edwards Sapien 3  Resilia THV (size 20 mm, model # 9755RLS, serial # [id number] )              Co-Surgeons:                        Coralie Common, MD and Lenna Sciara, MD Anesthesiologist:                  Suzette Battiest, MD   Echocardiographer:              Rudean Haskell, MD   Pre-operative Echo Findings: Severe aortic stenosis Normal left ventricular systolic function   Post-operative Echo Findings: No paravalvular leak Normal left ventricular systolic function   Left Heart Catheterization Findings: Left ventricular end-diastolic pressure of 123456 ____________   Echo 02/07/23: Completed but pending formal read at the time of discharge   History of Present Illness     Danielle Clarke is a 82 y.o. female with a history of symptomatic bradycardia s/p PPM placement 2020, ICH secondary to fall, moderate MR, DM2 on insulin, HLD, and severe aortic stenosis who presented to [facility] on 02/06/23 for planned TAVR.   Danielle Clarke was recently seen by Dr. Ali Lowe on 12/14/22 in TAVR consultation. She had been previously followed by Dr. Tamala Julian for many years with her most recent echocardiogram demonstrating severe aortic stenosis. She was noted to  have developed some exertional fatigue over several months however felt quite well during her consult and wished to monitor her symptoms and contact our team if she wished to proceed sooner. She then called back to schedule a follow up visit with intent to proceed with further TAVR workup.   She underwent cardiac catheterization which showed minimally obstructive coronary artery disease. She was then  evaluated by the multidisciplinary valve team and felt to have severe, symptomatic aortic stenosis and to be a suitable candidate for TAVR, which was set up for 02/06/23.     Hospital Course   Severe AS: s/p successful TAVR with a 20 mm Edwards Sapien 3 THV via the TF approach on 02/06/23. Post operative echo pending. Groin sites remain stable with no evidence of bleeding or  hematoma. ECG with NSR and no high grade heart block. She was continued on ASA '81mg'$  monotherapy. She ambulated with cardiac rehab without issues. Plan for early follow up next week then one month with our team with subsequent echocardiogram. She will require lifelong dental SBE, to be RX'ed at follow up next week.    Bradycardia s/p PPM placement 2020: No issues. Last interrogation 12/23 with normal device function and battery life. No changes.    ICH secondary to fall: No recent falls. Walks with four prong cane for stability.    Moderate MR: Continue to follow with surveillance imaging. Denies symptoms.    DM1: SSI while inpatient. Resumed on home regimen at discharge.   Incidental findings: New clustered nodularity in the left lower lobe with some associated peripheral airspace consolidation in the posteromedial left lower lobe. Findings are favored to be of infectious or inflammatory etiology, however, close attention on short-term repeat noncontrast chest CT is recommended in 2 months to ensure the regression of these findings, potentially after trial of antimicrobial therapy. (due around April 22nd)-per AT defer to PCP  Consultants: None    The patient has been seen and examined by Dr. Ali Lowe who feels that she is stable and ready for discharge today, 02/07/23 after echocardiogram complete and she has ambulated with cardiac rehabilitation.  _____________  Discharge Vitals Blood pressure (!) 133/47, pulse 90, temperature 98.3 F (36.8 C), temperature source Oral, resp. rate 17, height 5' (1.524 m), weight 47.8 kg, SpO2 94 %.  Filed Weights   02/06/23 0622 02/07/23 0439  Weight: 46.3 kg 47.8 kg   General: Well developed, well nourished, NAD Lungs:Clear to ausculation bilaterally. No wheezes, rales, or rhonchi. Breathing is unlabored. Cardiovascular: RRR with S1 S2. No murmur Abdomen: Soft, non-tender, non-distended. No obvious abdominal masses. Extremities: No edema. Groin sites  remain stable with no evidence of bleeding or hematoma.  Neuro: Alert and oriented. No focal deficits. No facial asymmetry. MAE spontaneously. Psych: Responds to questions appropriately with normal affect.    Labs & Radiologic Studies    CBC Recent Labs    02/06/23 0922 02/07/23 0109  WBC  --  7.9  HGB 10.5* 10.3*  HCT 31.0* 32.4*  MCV  --  90.0  PLT  --  [id number]   Basic Metabolic Panel Recent Labs    02/06/23 0922 02/07/23 0109  NA 139 134*  K 3.6 3.5  CL 100 100  CO2  --  26  GLUCOSE 170* 314*  BUN 12 9  CREATININE 0.50 1.02*  CALCIUM  --  8.2*  MG  --  1.7   Liver Function Tests No results for input(s): "AST", "ALT", "ALKPHOS", "BILITOT", "PROT", "ALBUMIN" in the last  72 hours. No results for input(s): "LIPASE", "AMYLASE" in the last 72 hours. Cardiac Enzymes No results for input(s): "CKTOTAL", "CKMB", "CKMBINDEX", "TROPONINI" in the last 72 hours. BNP Invalid input(s): "POCBNP" D-Dimer No results for input(s): "DDIMER" in the last 72 hours. Hemoglobin A1C No results for input(s): "HGBA1C" in the last 72 hours. Fasting Lipid Panel No results for input(s): "CHOL", "HDL", "LDLCALC", "TRIG", "CHOLHDL", "LDLDIRECT" in the last 72 hours. Thyroid Function Tests No results for input(s): "TSH", "T4TOTAL", "T3FREE", "THYROIDAB" in the last 72 hours.  Invalid input(s): "FREET3" _____________  ECHOCARDIOGRAM COMPLETE  Result Date: 02/07/2023    ECHOCARDIOGRAM REPORT   Patient Name:   Danielle Clarke Date of Exam: 02/07/2023 Medical Rec #:  [id number]          Height:       60.0 in Accession #:    [id number]         Weight:       105.4 lb Date of Birth:  07-21-1941          BSA:          1.422 m Patient Age:    52 years           BP:           133/42 mmHg Patient Gender: F                  HR:           86 bpm. Exam Location:  Inpatient Procedure: 2D Echo, Color Doppler and Cardiac Doppler Indications:    Post TAVR Evaluation z95.2  History:        Patient has prior history of  Echocardiogram examinations, most                 recent 02/06/2023. Pacemaker; Risk Factors:Hypertension, Diabetes                 and Dyslipidemia. 3m Edwards S3U TAVR Implanted 02/06/23.  Sonographer:    ERaquel SarnaSenior RDCS Referring Phys: 2[place] 1. The aortic valve has been replaced by a 20 mm Edwards Sapien valve. Aortic valve regurgitation is not visualized. Effective orifice area, by VTI measures 1.28 cm. Aortic valve mean gradient measures 11.0 mmHg. Peak gradient 18 mm Hg.  2. Left ventricular ejection fraction, by estimation, is 55 to 60%. The left ventricle has normal function. The left ventricle has no regional wall motion abnormalities. Left ventricular diastolic function could not be evaluated.  3. Right ventricular systolic function is hyperdynamic. The right ventricular size is normal. There is moderately elevated pulmonary artery systolic pressure. The estimated right ventricular systolic pressure is 50000000mmHg.  4. 2D mitral valve area 3.47 cm2. The mitral valve is degenerative. Mild to moderate mitral valve regurgitation. Mild mitral stenosis. The mean mitral valve gradient is 6.0 mmHg with average heart rate of 84 bpm. Severe mitral annular calcification.  5. Tricuspid valve regurgitation is moderate.  6. The inferior vena cava is normal in size with <50% respiratory variability, suggesting right atrial pressure of 8 mmHg. Comparison(s): Successful TAVR placement with acceptable gradients and no PVL. FINDINGS  Left Ventricle: Left ventricular ejection fraction, by estimation, is 55 to 60%. The left ventricle has normal function. The left ventricle has no regional wall motion abnormalities. The left ventricular internal cavity size was normal in size. Suboptimal image quality limits for assessment of left ventricular hypertrophy. Left ventricular diastolic function could not be evaluated due to mitral annular  calcification (moderate or greater). Left ventricular diastolic  function could not be evaluated. Right Ventricle: The right ventricular size is normal. No increase in right ventricular wall thickness. Right ventricular systolic function is hyperdynamic. There is moderately elevated pulmonary artery systolic pressure. The tricuspid regurgitant velocity is 3.34 m/s, and with an assumed right atrial pressure of 8 mmHg, the estimated right ventricular systolic pressure is 0000000 mmHg. Left Atrium: Left atrial size was normal in size. Right Atrium: Right atrial size was normal in size. Pericardium: There is no evidence of pericardial effusion. Mitral Valve: 2D mitral valve area 3.47 cm2. The mitral valve is degenerative in appearance. Severe mitral annular calcification. Mild to moderate mitral valve regurgitation. Mild mitral valve stenosis. MV peak gradient, 12.0 mmHg. The mean mitral valve gradient is 6.0 mmHg with average heart rate of 84 bpm. Tricuspid Valve: The tricuspid valve is normal in structure. Tricuspid valve regurgitation is moderate . No evidence of tricuspid stenosis. Aortic Valve: The aortic valve has been repaired/replaced. Aortic valve regurgitation is not visualized. Aortic valve mean gradient measures 11.0 mmHg. Aortic valve peak gradient measures 17.6 mmHg. Aortic valve area, by VTI measures 1.28 cm. Pulmonic Valve: The pulmonic valve was not well visualized. Pulmonic valve regurgitation is not visualized. No evidence of pulmonic stenosis. Aorta: The aortic root is normal in size and structure. Venous: The inferior vena cava is normal in size with less than 50% respiratory variability, suggesting right atrial pressure of 8 mmHg. IAS/Shunts: No atrial level shunt detected by color flow Doppler.  LEFT VENTRICLE PLAX 2D LVIDd:         3.90 cm   Diastology LVIDs:         2.20 cm   LV e' medial:    6.42 cm/s LV PW:         0.80 cm   LV E/e' medial:  19.5 LV IVS:        0.90 cm   LV e' lateral:   7.29 cm/s LVOT diam:     1.80 cm   LV E/e' lateral: 17.1 LV SV:          53 LV SV Index:   37 LVOT Area:     2.54 cm  RIGHT VENTRICLE RV S prime:     9.79 cm/s TAPSE (M-mode): 1.8 cm LEFT ATRIUM             Index        RIGHT ATRIUM           Index LA diam:        3.70 cm 2.60 cm/m   RA Area:     11.00 cm LA Vol (A2C):   39.4 ml 27.71 ml/m  RA Volume:   24.10 ml  16.95 ml/m LA Vol (A4C):   42.4 ml 29.82 ml/m LA Biplane Vol: 43.1 ml 30.32 ml/m  AORTIC VALVE AV Area (Vmax):    1.19 cm AV Area (Vmean):   1.16 cm AV Area (VTI):     1.28 cm AV Vmax:           210.00 cm/s AV Vmean:          160.000 cm/s AV VTI:            0.414 m AV Peak Grad:      17.6 mmHg AV Mean Grad:      11.0 mmHg LVOT Vmax:         98.00 cm/s LVOT Vmean:        72.700  cm/s LVOT VTI:          0.209 m LVOT/AV VTI ratio: 0.50 MITRAL VALVE                TRICUSPID VALVE MV Area (PHT): 3.21 cm     TR Peak grad:   44.6 mmHg MV Area VTI:   1.55 cm     TR Vmax:        334.00 cm/s MV Peak grad:  12.0 mmHg MV Mean grad:  6.0 mmHg     SHUNTS MV Vmax:       1.73 m/s     Systemic VTI:  0.21 m MV Vmean:      115.0 cm/s   Systemic Diam: 1.80 cm MV Decel Time: 236 msec MV E velocity: 125.00 cm/s MV A velocity: 162.00 cm/s MV E/A ratio:  0.77 Rudean Haskell MD Electronically signed by Rudean Haskell MD Signature Date/Time: 02/07/2023/10:58:43 AM    Final    ECHOCARDIOGRAM LIMITED  Result Date: 02/06/2023    ECHOCARDIOGRAM LIMITED REPORT   Patient Name:   Danielle Clarke Date of Exam: 02/06/2023 Medical Rec #:  [id number]          Height:       60.0 in Accession #:    [id number]         Weight:       102.0 lb Date of Birth:  1941/10/03          BSA:          1.402 m Patient Age:    79 years           BP:           133/42 mmHg Patient Gender: F                  HR:           66 bpm. Exam Location:  Inpatient Procedure: Limited Echo, Color Doppler and Cardiac Doppler Indications:     Aortic Stenosis i35.0  History:         Patient has prior history of Echocardiogram examinations, most                  recent  12/06/2022. Pacemaker; Risk Factors:Hypertension,                  Diabetes and Dyslipidemia.  Sonographer:     Raquel Sarna Senior RDCS Referring Phys:  [id number] Early Osmond Diagnosing Phys: Rudean Haskell MD  Sonographer Comments: 80m EPhilis KendallTAVR Implanted IMPRESSIONS  1. Interventional TTE for TAVR Placement.  2. Prior to procedure, calcified aortic valve. Mean gradient 39 mm Hg, Peak graident 58 mm Hg, DVI 0.21, with AVA 0.47 cm2; critical aortic stenosis. Mild aortic regurgitation by color flow.  3. After procedure a 20 mm Edwards Sapien Valve placed. No AI/PVL. Mean gradient 6 mm Hg, Peak gradient 11 mm Hg, DVI 0.48, EOAi 0.64.  4. Left ventricular ejection fraction, by estimation, is 55 to 60%. The left ventricle has normal function.  5. Right ventricular systolic function is normal. The right ventricular size is normal.  6. The mitral valve is degenerative. Severe mitral annular calcification.  7. Tricuspid valve regurgitation is mild to moderate.  8. The aortic valve is calcified. Aortic valve regurgitation is mild. Comparison(s): Prior images reviewed side by side. Successful TAVR placement. Higher gradients expect at follow up scan related to patient prosthesis mismatch (related to limited sizing options for patient). FINDINGS  Left  Ventricle: Left ventricular ejection fraction, by estimation, is 55 to 60%. The left ventricle has normal function. Right Ventricle: The right ventricular size is normal. Right ventricular systolic function is normal. Mitral Valve: The mitral valve is degenerative in appearance. Severe mitral annular calcification. MV peak gradient, 7.3 mmHg. The mean mitral valve gradient is 4.0 mmHg. Tricuspid Valve: Tricuspid valve regurgitation is mild to moderate. Aortic Valve: The aortic valve is calcified. Aortic valve regurgitation is mild. Aortic valve mean gradient measures 22.5 mmHg. Aortic valve peak gradient measures 29.6 mmHg. Aortic valve area, by VTI measures 0.66 cm.  Additional Comments: A is visualized in the right ventricle and right atrium. Spectral Doppler performed. Color Doppler performed.  LEFT VENTRICLE PLAX 2D LVOT diam:     1.70 cm LV SV:         50 LV SV Index:   35 LVOT Area:     2.27 cm  AORTIC VALVE AV Area (Vmax):    0.65 cm AV Area (Vmean):   0.65 cm AV Area (VTI):     0.66 cm AV Vmax:           272.00 cm/s AV Vmean:          207.500 cm/s AV VTI:            0.750 m AV Peak Grad:      29.6 mmHg AV Mean Grad:      22.5 mmHg LVOT Vmax:         77.85 cm/s LVOT Vmean:        59.150 cm/s LVOT VTI:          0.218 m LVOT/AV VTI ratio: 0.29 MITRAL VALVE MV Area VTI:  1.55 cm   SHUNTS MV Peak grad: 7.3 mmHg   Systemic VTI:  0.22 m MV Mean grad: 4.0 mmHg   Systemic Diam: 1.70 cm MV Vmax:      1.35 m/s MV Vmean:     87.7 cm/s Rudean Haskell MD Electronically signed by Rudean Haskell MD Signature Date/Time: 02/06/2023/2:29:14 PM    Final    Structural Heart Procedure  Result Date: 02/06/2023 See surgical note for result.  DG Chest 2 View  Result Date: 02/04/2023 CLINICAL DATA:  [id number] Pre-op exam [id number] EXAM: CHEST - 2 VIEW COMPARISON:  03/17/2019 FINDINGS: Cardiac silhouette is unremarkable. No pneumothorax or pleural effusion. The lungs are clear. Aorta is calcified. S shaped thoracolumbar scoliosis. Prosthetic right shoulder. Left-sided pacer. IMPRESSION: No acute cardiopulmonary process. Electronically Signed   By: Sammie Bench M.D.   On: 02/04/2023 12:46   CT ANGIO ABDOMEN PELVIS  W &/OR WO CONTRAST  Result Date: 01/19/2023 CLINICAL DATA:  82 year old female with history of severe aortic stenosis. Preprocedural study prior to potential transcatheter aortic valve replacement (TAVR) procedure. EXAM: CT ANGIOGRAPHY CHEST, ABDOMEN AND PELVIS TECHNIQUE: Multidetector CT imaging through the chest, abdomen and pelvis was performed using the standard protocol during bolus administration of intravenous contrast. Multiplanar reconstructed images and  MIPs were obtained and reviewed to evaluate the vascular anatomy. RADIATION DOSE REDUCTION: This exam was performed according to the departmental dose-optimization program which includes automated exposure control, adjustment of the mA and/or kV according to patient size and/or use of iterative reconstruction technique. CONTRAST:  159m OMNIPAQUE IOHEXOL 350 MG/ML SOLN COMPARISON:  Chest CT 02/06/2019. CT of the abdomen and pelvis 10/14/2020. FINDINGS: CTA CHEST FINDINGS Cardiovascular: Heart size is normal, although there is concentric left ventricular hypertrophy. There is no significant pericardial fluid, thickening or pericardial calcification. There  is aortic atherosclerosis, as well as atherosclerosis of the great vessels of the mediastinum and the coronary arteries, including calcified atherosclerotic plaque in the left main, left anterior descending and left circumflex coronary arteries. Thickening and calcification of the aortic valve. Severe calcifications of the mitral annulus. Left-sided pacemaker/AICD with lead tips terminating in the right atrium and right ventricle. Mediastinum/Lymph Nodes: No pathologically enlarged mediastinal or hilar lymph nodes. Esophagus is unremarkable in appearance. No axillary lymphadenopathy. Lungs/Pleura: Multiple new pulmonary nodules clustered in the left lower lobe, largest of which (axial image 94 of series 9) measures 1.3 x 1.0 cm. Associated with this there is some focal airspace consolidation in the posteromedial left lower lobe best appreciated on axial image 101 of series 9. Chronic scarring also noted in the left lower lobe and medial aspect of the right lower lobe, similar to prior studies. Scattered areas of mucoid impaction are noted, most evident in central right lower lobe bronchi. No pleural effusions. Musculoskeletal/Soft Tissues: There are no aggressive appearing lytic or blastic lesions noted in the visualized portions of the skeleton. Postoperative  changes in the lower cervical spine incidentally noted. CTA ABDOMEN AND PELVIS FINDINGS Hepatobiliary: No suspicious cystic or solid hepatic lesions. No intra or extrahepatic biliary ductal dilatation. Gallbladder is unremarkable in appearance. Pancreas: No pancreatic mass. No pancreatic ductal dilatation. No pancreatic or peripancreatic fluid collections or inflammatory changes. Spleen: Unremarkable. Adrenals/Urinary Tract: Bilateral kidneys and adrenal glands are normal in appearance. No hydroureteronephrosis. Urinary bladder is unremarkable in appearance. Stomach/Bowel: Stomach is unremarkable in appearance. No pathologic dilatation of small bowel or colon. A few scattered colonic diverticula are noted, without surrounding inflammatory changes to indicate an acute diverticulitis at this time. The appendix is not confidently identified and may be surgically absent. Regardless, there are no inflammatory changes noted adjacent to the cecum to suggest the presence of an acute appendicitis at this time. Vascular/Lymphatic: Aortic atherosclerosis, without evidence of aneurysm or dissection in the abdominal or pelvic vasculature. Vascular findings and measurements pertinent to potential TAVR procedure, as detailed below. No lymphadenopathy noted in the abdomen or pelvis. Reproductive: Multiple partially calcified lesions noted in the uterus, largest of which measures 2.2 cm in the fundus, presumably small fibroids. Ovaries are atrophic. Other: No significant volume of ascites.  No pneumoperitoneum. Musculoskeletal: Severe levoscoliosis of the lumbar spine convex to the left at the level of L2-L3. There are no aggressive appearing lytic or blastic lesions noted in the visualized portions of the skeleton. VASCULAR MEASUREMENTS PERTINENT TO TAVR: AORTA: Minimal Aortic Diameter-11 x 9 mm Severity of Aortic Calcification-moderate to severe RIGHT PELVIS: Right Common Iliac Artery - Minimal Diameter-5.2 x 6.4 mm  Tortuosity-mild Calcification-mild-to-moderate Right External Iliac Artery - Minimal Diameter-4.9 x 5.0 mm Tortuosity-mild Calcification-none Right Common Femoral Artery - Minimal Diameter-4.5 x 5.5 mm Tortuosity-mild Calcification-mild LEFT PELVIS: Left Common Iliac Artery - Minimal Diameter-5.6 x 6.4 mm Tortuosity-severe Calcification-moderate Left External Iliac Artery - Minimal Diameter-4.7 x 5.0 mm Tortuosity-mild Calcification-none Left Common Femoral Artery - Minimal Diameter-4.8 x 4.4 mm Tortuosity-mild Calcification-mild Review of the MIP images confirms the above findings. IMPRESSION: 1. Vascular findings and measurements pertinent to potential TAVR procedure, as detailed above. 2. Thickening calcification of the aortic valve, compatible with reported clinical history of aortic stenosis. 3. Severe calcifications of the mitral annulus also noted. 4. Aortic atherosclerosis, in addition to left main and 2 vessel coronary artery disease. 5. New clustered nodularity in the left lower lobe with some associated peripheral airspace consolidation in the posteromedial left  lower lobe. Findings are favored to be of infectious or inflammatory etiology, however, close attention on short-term repeat noncontrast chest CT is recommended in 2 months to ensure the regression of these findings, potentially after trial of antimicrobial therapy. 6. Mild colonic diverticulosis without evidence of acute diverticulitis at this time. 7. Additional incidental findings, as above. Electronically Signed   By: Vinnie Langton M.D.   On: 01/19/2023 08:35   CT ANGIO CHEST AORTA W/CM & OR WO/CM  Result Date: 01/19/2023 CLINICAL DATA:  82 year old female with history of severe aortic stenosis. Preprocedural study prior to potential transcatheter aortic valve replacement (TAVR) procedure. EXAM: CT ANGIOGRAPHY CHEST, ABDOMEN AND PELVIS TECHNIQUE: Multidetector CT imaging through the chest, abdomen and pelvis was performed using the  standard protocol during bolus administration of intravenous contrast. Multiplanar reconstructed images and MIPs were obtained and reviewed to evaluate the vascular anatomy. RADIATION DOSE REDUCTION: This exam was performed according to the departmental dose-optimization program which includes automated exposure control, adjustment of the mA and/or kV according to patient size and/or use of iterative reconstruction technique. CONTRAST:  110m OMNIPAQUE IOHEXOL 350 MG/ML SOLN COMPARISON:  Chest CT 02/06/2019. CT of the abdomen and pelvis 10/14/2020. FINDINGS: CTA CHEST FINDINGS Cardiovascular: Heart size is normal, although there is concentric left ventricular hypertrophy. There is no significant pericardial fluid, thickening or pericardial calcification. There is aortic atherosclerosis, as well as atherosclerosis of the great vessels of the mediastinum and the coronary arteries, including calcified atherosclerotic plaque in the left main, left anterior descending and left circumflex coronary arteries. Thickening and calcification of the aortic valve. Severe calcifications of the mitral annulus. Left-sided pacemaker/AICD with lead tips terminating in the right atrium and right ventricle. Mediastinum/Lymph Nodes: No pathologically enlarged mediastinal or hilar lymph nodes. Esophagus is unremarkable in appearance. No axillary lymphadenopathy. Lungs/Pleura: Multiple new pulmonary nodules clustered in the left lower lobe, largest of which (axial image 94 of series 9) measures 1.3 x 1.0 cm. Associated with this there is some focal airspace consolidation in the posteromedial left lower lobe best appreciated on axial image 101 of series 9. Chronic scarring also noted in the left lower lobe and medial aspect of the right lower lobe, similar to prior studies. Scattered areas of mucoid impaction are noted, most evident in central right lower lobe bronchi. No pleural effusions. Musculoskeletal/Soft Tissues: There are no  aggressive appearing lytic or blastic lesions noted in the visualized portions of the skeleton. Postoperative changes in the lower cervical spine incidentally noted. CTA ABDOMEN AND PELVIS FINDINGS Hepatobiliary: No suspicious cystic or solid hepatic lesions. No intra or extrahepatic biliary ductal dilatation. Gallbladder is unremarkable in appearance. Pancreas: No pancreatic mass. No pancreatic ductal dilatation. No pancreatic or peripancreatic fluid collections or inflammatory changes. Spleen: Unremarkable. Adrenals/Urinary Tract: Bilateral kidneys and adrenal glands are normal in appearance. No hydroureteronephrosis. Urinary bladder is unremarkable in appearance. Stomach/Bowel: Stomach is unremarkable in appearance. No pathologic dilatation of small bowel or colon. A few scattered colonic diverticula are noted, without surrounding inflammatory changes to indicate an acute diverticulitis at this time. The appendix is not confidently identified and may be surgically absent. Regardless, there are no inflammatory changes noted adjacent to the cecum to suggest the presence of an acute appendicitis at this time. Vascular/Lymphatic: Aortic atherosclerosis, without evidence of aneurysm or dissection in the abdominal or pelvic vasculature. Vascular findings and measurements pertinent to potential TAVR procedure, as detailed below. No lymphadenopathy noted in the abdomen or pelvis. Reproductive: Multiple partially calcified lesions noted in the uterus,  largest of which measures 2.2 cm in the fundus, presumably small fibroids. Ovaries are atrophic. Other: No significant volume of ascites.  No pneumoperitoneum. Musculoskeletal: Severe levoscoliosis of the lumbar spine convex to the left at the level of L2-L3. There are no aggressive appearing lytic or blastic lesions noted in the visualized portions of the skeleton. VASCULAR MEASUREMENTS PERTINENT TO TAVR: AORTA: Minimal Aortic Diameter-11 x 9 mm Severity of Aortic  Calcification-moderate to severe RIGHT PELVIS: Right Common Iliac Artery - Minimal Diameter-5.2 x 6.4 mm Tortuosity-mild Calcification-mild-to-moderate Right External Iliac Artery - Minimal Diameter-4.9 x 5.0 mm Tortuosity-mild Calcification-none Right Common Femoral Artery - Minimal Diameter-4.5 x 5.5 mm Tortuosity-mild Calcification-mild LEFT PELVIS: Left Common Iliac Artery - Minimal Diameter-5.6 x 6.4 mm Tortuosity-severe Calcification-moderate Left External Iliac Artery - Minimal Diameter-4.7 x 5.0 mm Tortuosity-mild Calcification-none Left Common Femoral Artery - Minimal Diameter-4.8 x 4.4 mm Tortuosity-mild Calcification-mild Review of the MIP images confirms the above findings. IMPRESSION: 1. Vascular findings and measurements pertinent to potential TAVR procedure, as detailed above. 2. Thickening calcification of the aortic valve, compatible with reported clinical history of aortic stenosis. 3. Severe calcifications of the mitral annulus also noted. 4. Aortic atherosclerosis, in addition to left main and 2 vessel coronary artery disease. 5. New clustered nodularity in the left lower lobe with some associated peripheral airspace consolidation in the posteromedial left lower lobe. Findings are favored to be of infectious or inflammatory etiology, however, close attention on short-term repeat noncontrast chest CT is recommended in 2 months to ensure the regression of these findings, potentially after trial of antimicrobial therapy. 6. Mild colonic diverticulosis without evidence of acute diverticulitis at this time. 7. Additional incidental findings, as above. Electronically Signed   By: Vinnie Langton M.D.   On: 01/19/2023 08:35   CT CORONARY MORPH W/CTA COR W/SCORE W/CA W/CM &/OR WO/CM  Addendum Date: 01/19/2023   ADDENDUM REPORT: 01/19/2023 08:05 ADDENDUM: Extracardiac findings will be described separately under dictation for contemporaneously obtained CTA chest, abdomen and pelvis 01/18/2023. Please  see that report for full description of relevant extracardiac findings. Electronically Signed   By: Vinnie Langton M.D.   On: 01/19/2023 08:05   Result Date: 01/19/2023 CLINICAL DATA:  Aortic Stenosis EXAM: Cardiac TAVR CT TECHNIQUE: The patient was scanned on a Siemens Force [id number] slice scanner. A 120 kV retrospective scan was triggered in the ascending thoracic aorta at 140 HU's. Gantry rotation speed was 250 msecs and collimation was .6 mm. No beta blockade or nitro were given. The 3D data set was reconstructed in 5% intervals of the R-R cycle. Systolic and diastolic phases were analyzed on a dedicated work station using MPR, MIP and VRT modes. The patient received 80 cc of contrast. FINDINGS: Aortic Valve: Tri leaflet calcified aortic valve with restricted leaflet motion Calcium score somewhat low at 765 Aorta: Normal arch vessels no aneurysm Moderate calcific atherosclerosis Sino-tubular Junction: 21.5 mm with large area of circumferential calcium extending from RCA ostium about 1/3 of circumference Ascending Thoracic Aorta: 24.5 mm Aortic Arch: 22 mm Descending Thoracic Aorta: 17 mm Sinus of Valsalva Measurements: Non-coronary: 21.4 mm  Height 15.2 mm Right - coronary: 22.1 mm  Height 14.9 mm Left -   coronary: 21.8 mm  Height 15.5 mm Coronary Artery Height above Annulus: Left Main: 11 mm above annulus Right Coronary: 11.4 mm above annulus Virtual Basal Annulus Measurements: Done in 30% phase Maximum / Minimum Diameter: 20.7 mm x 18.4 mm Average diameter 20 mm Perimeter: 64.1 Area: 315 mm2 Coronary Arteries:  Sufficient height above annulus for deployment Optimum Fluoroscopic Angle for Delivery: RAO 6 Caudal 8 degrees IMPRESSION: 1. Calcified tri leaflet aortic valve with small annular area of 315 mm2 most suitable for a 23 mm Medtronic Evolut valve 2.  Coronary arteries sufficient height for deployment 3.  Optimum angiographic angle for deployment RAO 6 Caudal 8 degrees 4.  Membranous septal length 6.9 mm 5.   Aortic Valve Calcium score 765 6.  Severe posterior mitral annular calcification 7.  Pacing wires noted in RA/RV Jenkins Rouge Electronically Signed: By: Jenkins Rouge M.D. On: 01/18/2023 12:26   CARDIAC CATHETERIZATION  Result Date: 01/11/2023 1.  Minimal obstructive coronary artery disease. 2.  Mean RA pressure 5 mmHg, RV pressure of 41/-4 with RV end-diastolic pressure of 7 mmHg, mean wedge pressure of 8 mmHg, and mean PA pressure 20 mmHg with a Fick cardiac output of 4.7 L/min and a Fick cardiac index of 3.3 L/min/m. Recommendation: Continue evaluation for aortic valve intervention.   Disposition   Pt is being discharged home today in good condition.  Follow-up Plans & Appointments    Follow-up Information     Tommie Raymond, NP Follow up on 02/14/2023.   Specialty: Cardiology Why: @ 4pm. Please arrive at 345pm. Contact information: [place] [address] [phone]                Discharge Instructions     Call MD for:  difficulty breathing, headache or visual disturbances   Complete by: As directed    Call MD for:  extreme fatigue   Complete by: As directed    Call MD for:  hives   Complete by: As directed    Call MD for:  persistant dizziness or light-headedness   Complete by: As directed    Call MD for:  persistant nausea and vomiting   Complete by: As directed    Call MD for:  redness, tenderness, or signs of infection (pain, swelling, redness, odor or green/yellow discharge around incision site)   Complete by: As directed    Call MD for:  severe uncontrolled pain   Complete by: As directed    Call MD for:  temperature >100.4   Complete by: As directed    Diet - low sodium heart healthy   Complete by: As directed    Discharge instructions   Complete by: As directed    ACTIVITY AND EXERCISE  Daily activity and exercise are an important part of your recovery. People recover at different rates depending on their general health and  type of valve procedure.  Most people recovering from TAVR feel better relatively quickly   No lifting, pushing, pulling more than 10 pounds (examples to avoid: groceries, vacuuming, gardening, golfing):             - For one week with a procedure through the groin.             - For six weeks for procedures through the chest wall or neck. NOTE: You will typically see one of our providers 7-14 days after your procedure to discuss [place] the above activities.      DRIVING  Do not drive until you are seen for follow up and cleared by a provider. Generally, we ask patient to not drive for 1 week after their procedure.  If you have been told by your doctor in the past that you may not drive, you must talk with him/her before  you begin driving again.   DRESSING  Groin site: you may leave the clear dressing over the site for up to one week or until it falls off.   HYGIENE  If you had a femoral (leg) procedure, you may take a shower when you return home. After the shower, pat the site dry. Do NOT use powder, oils or lotions in your groin area until the site has completely healed.  If you had a chest procedure, you may shower when you return home unless specifically instructed not to by your discharging practitioner.             - DO NOT scrub incision; pat dry with a towel.             - DO NOT apply any lotions, oils, powders to the incision.             - No tub baths / swimming for at least 2 weeks.  If you notice any fevers, chills, increased pain, swelling, bleeding or pus, please contact your doctor.   ADDITIONAL INFORMATION  If you are going to have an upcoming dental procedure, please contact our office as you will require antibiotics ahead of time to prevent infection on your heart valve.    If you have any questions or concerns you can call the structural heart phone during normal business hours 8am-4pm. If you have an urgent need after hours or weekends please call  [phone] to talk to the on call provider for general cardiology. If you have an emergency that requires immediate attention, please call 911.    After TAVR Checklist  Check  Test Description  Follow up appointment in 1-2 weeks  You will see our structural heart advanced practice provider. Your incision sites will be checked and you will be cleared to drive and resume all normal activities if you are doing well.    1 month echo and follow up  You will have an echo to check on your new heart valve and be seen back in the office by a structural heart advanced practice provider.  Follow up with your primary cardiologist You will need to be seen by your primary cardiologist in the following 3-6 months after your 1 month appointment in the valve clinic.   1 year echo and follow up You will have another echo to check on your heart valve after 1 year and be seen back in the office by a structural heart advanced practice provider. This your last structural heart visit.  Bacterial endocarditis prophylaxis  You will have to take antibiotics for the rest of your life before all dental procedures (even teeth cleanings) to protect your heart valve. Antibiotics are also required before some surgeries. Please check with your cardiologist before scheduling any surgeries. Also, please make sure to tell us if you have a penicillin allergy as you will require an alternative antibiotic.   Increase activity slowly   Complete by: As directed       Discharge Medications   Allergies as of 02/07/2023       Reactions   Cefaclor Anaphylaxis   Tolerated cephalexin in 2018   Tetracycline Anaphylaxis   Vancomycin Anaphylaxis   Augmentin [amoxicillin-pot Clavulanate] Other (See Comments)   Reaction unknown >> Anaphylaxis Has patient had a PCN reaction causing immediate rash, facial/tongue/throat swelling, SOB or lightheadedness with hypotension: Unknown Has patient had a PCN reaction causing severe rash involving  mucus membranes or skin necrosis: Unknown Has patient had a  PCN reaction that required hospitalization: Unknown Has patient had a PCN reaction occurring within the last 10 years: Unknown If all of the above answers are "NO", then may proceed with Cephalosporin use.   Nsaids    CKD   Quinapril Hcl    elevated potassium   Sulfamethoxazole-trimethoprim    decreased kidney function   Prednisone Other (See Comments)   Reaction unknown        Medication List     TAKE these medications    aspirin EC 81 MG tablet Take 1 tablet (81 mg total) by mouth daily. Swallow whole. What changed: when to take this   B-12 5000 MCG Caps Take 5,000 mcg by mouth daily.   ferrous sulfate 325 (65 FE) MG tablet Take 325 mg by mouth daily with breakfast.   HumaLOG KwikPen 100 UNIT/ML KwikPen Generic drug: insulin lispro Inject 0-10 Units into the skin 3 (three) times daily as needed (high blood sugar). Sliding Scale   insulin glargine 100 UNIT/ML Solostar Pen Commonly known as: LANTUS Inject 13 Units into the skin daily. What changed:  how much to take when to take this   montelukast 10 MG tablet Commonly known as: SINGULAIR Take 10 mg by mouth at bedtime.   protein supplement shake Liqd Commonly known as: PREMIER PROTEIN Take 11 oz by mouth 2 (two) times daily. 30 mg of protein   Salonpas Pain Relief Patch Ptch Apply 1 patch topically daily as needed (patch).   simvastatin 40 MG tablet Commonly known as: ZOCOR Take 40 mg by mouth at bedtime.   SYSTANE OP Place 1 drop into both eyes daily as needed (dry eyes).   Vitamin D 50 MCG (2000 UT) tablet Take 2,000 Units by mouth daily.         Outstanding Labs/Studies   None   Duration of Discharge Encounter   Greater than 30 minutes including physician time.  Signed, Kathyrn Drown, NP 02/07/2023, 11:14 AM [phone]   ATTENDING ATTESTATION:  After conducting a review of all available clinical information with the care  team, interviewing the patient, and performing a physical exam, I agree with the findings and plan described in this note.   GEN: No acute distress.   HEENT:  MMM, no JVD, no scleral icterus Cardiac: RRR, no murmurs, rubs, or gallops.  Respiratory: Clear to auscultation bilaterally. GI: Soft, nontender, non-distended  MS: No edema; No deformity. 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  Her access sites are intact, she has developed no signs or symptoms of stroke, and her conduction is intact.  Mean gradient across the aortic valve is now 11 mmHg.  Discharge today with close hospital follow-up.  Lenna Sciara, MD Pager [phone]

## 2023-02-06 NOTE — Progress Notes (Signed)
Pt arrived to 4east from cath lab. Bilateral groin sites level 0. Right radial a-line with 12cc air. Vitals obtained. Telemetry box applied and CCMD notified x2 verifiers. Family at bedside. Pt and family instructed on bedrest until 3:45pm.

## 2023-02-06 NOTE — Transfer of Care (Signed)
Immediate Anesthesia Transfer of Care Note  Patient: Danielle Clarke  Procedure(s) Performed: Transcatheter Aortic Valve Replacement, Transfemoral (Chest) INTRAOPERATIVE TRANSTHORACIC ECHOCARDIOGRAM  Patient Location: PACU  Anesthesia Type:MAC  Level of Consciousness: drowsy and patient cooperative  Airway & Oxygen Therapy: Patient Spontanous Breathing and Patient connected to nasal cannula oxygen  Post-op Assessment: Report given to RN, Post -op Vital signs reviewed and stable, and Patient moving all extremities X 4  Post vital signs: Reviewed and stable  Last Vitals:  Vitals Value Taken Time  BP 123/38 02/06/23 1017  Temp 36.7 C 02/06/23 1017  Pulse 74 02/06/23 1017  Resp 14 02/06/23 1017  SpO2 95 % 02/06/23 1012  Vitals shown include unvalidated device data.  Last Pain:  Vitals:   02/06/23 1017  TempSrc:   PainSc: 0-No pain         Complications: No notable events documented.

## 2023-02-07 ENCOUNTER — Encounter (HOSPITAL_COMMUNITY): Payer: Self-pay | Admitting: Internal Medicine

## 2023-02-07 ENCOUNTER — Inpatient Hospital Stay (HOSPITAL_COMMUNITY): Payer: Medicare PPO

## 2023-02-07 DIAGNOSIS — Z952 Presence of prosthetic heart valve: Secondary | ICD-10-CM

## 2023-02-07 LAB — GLUCOSE, CAPILLARY
Glucose-Capillary: 126 mg/dL — ABNORMAL HIGH (ref 70–99)
Glucose-Capillary: 143 mg/dL — ABNORMAL HIGH (ref 70–99)
Glucose-Capillary: 259 mg/dL — ABNORMAL HIGH (ref 70–99)

## 2023-02-07 LAB — ECHOCARDIOGRAM COMPLETE
AR max vel: 1.19 cm2
AV Area VTI: 1.28 cm2
AV Area mean vel: 1.16 cm2
AV Mean grad: 11 mmHg
AV Peak grad: 17.6 mmHg
Ao pk vel: 2.1 m/s
Area-P 1/2: 3.21 cm2
Height: 60 in
MV VTI: 1.55 cm2
S' Lateral: 2.2 cm
Weight: 1686.08 oz

## 2023-02-07 LAB — BASIC METABOLIC PANEL
Anion gap: 8 (ref 5–15)
BUN: 9 mg/dL (ref 8–23)
CO2: 26 mmol/L (ref 22–32)
Calcium: 8.2 mg/dL — ABNORMAL LOW (ref 8.9–10.3)
Chloride: 100 mmol/L (ref 98–111)
Creatinine, Ser: 1.02 mg/dL — ABNORMAL HIGH (ref 0.44–1.00)
GFR, Estimated: 56 mL/min — ABNORMAL LOW (ref >60)
Glucose, Bld: 314 mg/dL — ABNORMAL HIGH (ref 70–99)
Potassium: 3.5 mmol/L (ref 3.5–5.1)
Sodium: 134 mmol/L — ABNORMAL LOW (ref 135–145)

## 2023-02-07 LAB — CBC
HCT: 32.4 % — ABNORMAL LOW (ref 36.0–46.0)
Hemoglobin: 10.3 g/dL — ABNORMAL LOW (ref 12.0–15.0)
MCH: 28.6 pg (ref 26.0–34.0)
MCHC: 31.8 g/dL (ref 30.0–36.0)
MCV: 90 fL (ref 80.0–100.0)
Platelets: 157 10*3/uL (ref 150–400)
RBC: 3.6 MIL/uL — ABNORMAL LOW (ref 3.87–5.11)
RDW: 13.8 % (ref 11.5–15.5)
WBC: 7.9 10*3/uL (ref 4.0–10.5)
nRBC: 0 % (ref 0.0–0.2)

## 2023-02-07 LAB — MAGNESIUM: Magnesium: 1.7 mg/dL (ref 1.7–2.4)

## 2023-02-07 NOTE — TOC Transition Note (Signed)
Transition of Care (TOC) - CM/SW Discharge Note Marvetta Gibbons RN, BSN Transitions of Care Unit 4E- RN Case Manager See Treatment Team for direct phone #   Patient Details  Name: Danielle Clarke MRN: [id number] Date of Birth: [birth year]  Transition of Care [facility]) CM/SW Contact:  Dawayne Patricia, RN Phone Number: 02/07/2023, 12:07 PM   Clinical Narrative:    Pt stable for transition home today s/p TAVR, Pt lives home alone, has caregiver. [name] caregiver to transport home today.  No TOC needs noted.    Final next level of care: Home/Self Care Barriers to Discharge: No Barriers Identified   Patient Goals and CMS Choice CMS Medicare.gov Compare Post Acute Care list provided to:: Patient Choice offered to / list presented to : NA  Discharge Placement               Home          Discharge Plan and Services Additional resources added to the After Visit Summary for       Post Acute Care Choice: NA          DME Arranged: N/A DME Agency: NA       HH Arranged: NA [place] Agency: NA        Social Determinants of Health (SDOH) Interventions SDOH Screenings   Depression (PHQ2-9): Low Risk  (05/28/2019)  Tobacco Use: Low Risk  (02/07/2023)     Readmission Risk Interventions    02/07/2023   12:07 PM 10/15/2020    3:55 PM  Readmission Risk Prevention Plan  Post Dischage Appt Complete Complete  Medication Screening Complete Complete  Transportation Screening Complete Complete

## 2023-02-07 NOTE — Progress Notes (Signed)
Discharge instructions given. Patient verbalized understanding and all questions were answered.  ?

## 2023-02-07 NOTE — Progress Notes (Signed)
CARDIAC REHAB PHASE I   PRE:  Rate/Rhythm: 88 SR    BP: sitting 124/44    SpO2: 97 RA  MODE:  Ambulation: 350 ft   POST:  Rate/Rhythm: 97 SR    BP: sitting 145/50     SpO2: 97 RA  Pt moved out of bed and ambulated with RW. Slow pace, gait belt for safety but no LOB. Has RW at home. Denied c/o. Return to bed, VSS.   Discussed restrictions, walking at home. Declined CRPII, she likes to walk at [facility]. [id number]   Yves Dill BS, ACSM-CEP 02/07/2023 11:01 AM

## 2023-02-07 NOTE — Progress Notes (Signed)
Echocardiogram 2D Echocardiogram has been performed.  Oneal Deputy Juniper Cobey RDCS 02/07/2023, 8:51 AM

## 2023-02-07 NOTE — Inpatient Diabetes Management (Signed)
Inpatient Diabetes Program Recommendations  AACE/ADA: New Consensus Statement on Inpatient Glycemic Control   Target Ranges:  Prepandial:   less than 140 mg/dL      Peak postprandial:   less than 180 mg/dL (1-2 hours)      Critically ill patients:  140 - 180 mg/dL    Latest Reference Range & Units 02/06/23 06:19 02/06/23 11:52 02/06/23 12:10 02/06/23 13:18 02/06/23 18:42 02/06/23 20:16 02/06/23 23:42 02/07/23 04:37 02/07/23 07:58  Glucose-Capillary 70 - 99 mg/dL 55 (L) 44 (LL) 138 (H) 135 (H) 82 119 (H) 388 (H) 126 (H) 143 (H)   Review of Glycemic Control  Diabetes history: DM1 (does NOT make any insulin; requires basal, correction, and carb coverage insulin) Outpatient Diabetes medications: Lantus 10-12 units QPM, Humalog 0-10 units TID with meals Current orders for Inpatient glycemic control: Novolog 0-24 units Q4H  Inpatient Diabetes Program Recommendations:    Insulin: Please consider ordering Lantus 6 units Q24H starting now and change Novolog correction to 0-9 units Q4H.   Thanks, Barnie Alderman, RN, MSN, [place] Diabetes Coordinator Inpatient Diabetes Program [phone] (Team Pager from 8am to [place])

## 2023-02-08 ENCOUNTER — Telehealth: Payer: Self-pay | Admitting: Cardiology

## 2023-02-08 NOTE — Telephone Encounter (Signed)
  [facility] VALVE TEAM   Patient contacted regarding discharge from [facility] on 02/07/23   Patient understands to follow up with provider Kathyrn Drown NP-C on 02/14/23  Patient understands discharge instructions? Yes Patient understands medications and regimen? Yes  Patient understands to bring all medications to this visit? Yes   Kathyrn Drown NP-C Structural Heart Team  Pager: [phone]

## 2023-02-09 MED FILL — Heparin Sodium (Porcine) Inj 1000 Unit/ML: Qty: 1000 | Status: AC

## 2023-02-09 MED FILL — Magnesium Sulfate Inj 50%: INTRAMUSCULAR | Qty: 10 | Status: AC

## 2023-02-09 MED FILL — Potassium Chloride Inj 2 mEq/ML: INTRAVENOUS | Qty: 40 | Status: AC

## 2023-02-14 ENCOUNTER — Ambulatory Visit (INDEPENDENT_AMBULATORY_CARE_PROVIDER_SITE_OTHER): Payer: Medicare PPO

## 2023-02-14 ENCOUNTER — Ambulatory Visit: Payer: Medicare PPO | Admitting: Cardiology

## 2023-02-14 VITALS — BP 144/50 | HR 89 | Ht 60.0 in | Wt 102.2 lb

## 2023-02-14 DIAGNOSIS — Z950 Presence of cardiac pacemaker: Secondary | ICD-10-CM | POA: Diagnosis not present

## 2023-02-14 DIAGNOSIS — I442 Atrioventricular block, complete: Secondary | ICD-10-CM | POA: Diagnosis not present

## 2023-02-14 DIAGNOSIS — Z952 Presence of prosthetic heart valve: Secondary | ICD-10-CM | POA: Diagnosis not present

## 2023-02-14 DIAGNOSIS — I10 Essential (primary) hypertension: Secondary | ICD-10-CM | POA: Diagnosis not present

## 2023-02-14 DIAGNOSIS — I350 Nonrheumatic aortic (valve) stenosis: Secondary | ICD-10-CM

## 2023-02-14 DIAGNOSIS — N181 Chronic kidney disease, stage 1: Secondary | ICD-10-CM | POA: Diagnosis not present

## 2023-02-14 DIAGNOSIS — E1022 Type 1 diabetes mellitus with diabetic chronic kidney disease: Secondary | ICD-10-CM | POA: Diagnosis not present

## 2023-02-14 NOTE — Progress Notes (Unsigned)
[facility] [place]                                     Cardiology Office Note:    Date:  02/15/2023   ID:  Danielle Clarke, DOB 18-Jun-1941, MRN [id number]  PCP:  Danielle Jewel, MD  [facility] Cardiologist:  Danielle Osmond, MD Danielle Duke Luiz Iron, MD / Danielle. Lavonna Monarch, MD (TAVR)  [facility] Electrophysiologist:  None   Referring MD: Danielle Jewel, MD   Chief Complaint  Patient presents with   Follow-up    TOC s/p TAVR   History of Present Illness:    Danielle Clarke is a 82 y.o. female with a hx of symptomatic bradycardia s/p PPM placement 2020, ICH secondary to fall, moderate MR, DM2 on insulin, HLD, and severe aortic stenosis who presented to [facility] on 02/06/23 for planned TAVR.    Danielle Clarke was recently seen by Danielle Clarke on 12/14/22 in TAVR consultation. She had been previously followed by Danielle Clarke for many years with her most recent echocardiogram demonstrating severe aortic stenosis. She was noted to have developed some exertional fatigue over several months however felt quite well during her consult and wished to monitor her symptoms and contact our team if she wished to proceed sooner. She then called back to schedule a follow up visit with intent to proceed with further TAVR workup.    She underwent cardiac catheterization which showed minimally obstructive coronary artery disease. She was then  evaluated by the multidisciplinary valve team and felt to have severe, symptomatic aortic stenosis and to be a suitable candidate and is now s/p successful TAVR with a 20 mm Edwards Sapien 3 THV via the TF approach on 02/06/23. She was continued on ASA 81mg  monotherapy.   Today she is here for [facility] follow up and reports she has been doing well. She continues to be a little tired but otherwise has no chest pain, palpitations, SOB, LE edema, bleeding in stool or urine, dizziness, or syncope. Groin sites are stable.     Past Medical  History:  Diagnosis Date   Aortic stenosis, mild    Arthritis    Asthmatic bronchitis    with colds per patient   Carotid artery disease (HCC)    40-59% bilateral ICA stenosis   Cataract    Cutaneous abscess of right foot    Glaucoma    Heart murmur    Danielle Clarke is her  cardiologist.    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 Lavonna Clarke   Syncope    Type 1 diabetes mellitus [facility])     Past Surgical History:  Procedure Laterality Date   ANKLE FRACTURE SURGERY Left 2001   steel plate and 3 screws    ANTERIOR CERVICAL CORPECTOMY N/A 07/31/2014   Procedure: Cervical Four to Cervical Six Corpectomy;  Surgeon: Floyce Stakes, MD;  Location: [facility];  Service: Neurosurgery;  Laterality: N/A;  C4 to C6 Corpectomy   BREAST SURGERY  1988   CATARACT EXTRACTION W/ INTRAOCULAR LENS  IMPLANT, BILATERAL Bilateral 2011   right and then left  EYE SURGERY     FRACTURE SURGERY     HAMMER TOE SURGERY  1998   I & D EXTREMITY Right 09/27/2018   Procedure: IRRIGATION AND DEBRIDEMENT RIGHT FOOT;  Surgeon: Newt Minion, MD;  Location: [employer];  Service: Orthopedics;  Laterality: Right;   INTRAOPERATIVE TRANSTHORACIC ECHOCARDIOGRAM N/A 02/06/2023   Procedure: INTRAOPERATIVE TRANSTHORACIC ECHOCARDIOGRAM;  Surgeon: Danielle Osmond, MD;  Location: [place];  Service: Open Heart Surgery;  Laterality: N/A;   JOINT REPLACEMENT     PACEMAKER IMPLANT N/A 02/10/2019   Procedure: PACEMAKER IMPLANT;  Surgeon: Evans Lance, MD;  Location: [place] [facility];  Service: Cardiovascular;  Laterality: N/A;   RIGHT/LEFT HEART CATH AND CORONARY ANGIOGRAPHY N/A 01/11/2023   Procedure: RIGHT/LEFT HEART CATH AND CORONARY ANGIOGRAPHY;  Surgeon: Danielle Osmond, MD;  Location: [place] [facility];  Service: Cardiovascular;  Laterality: N/A;    [place]   TOTAL SHOULDER REPLACEMENT  2010   right shoulder    TRANSCATHETER AORTIC VALVE REPLACEMENT, TRANSFEMORAL N/A 02/06/2023   Procedure: Transcatheter Aortic Valve Replacement, Transfemoral;  Surgeon: Danielle Osmond, MD;  Location: [place];  Service: Open Heart Surgery;  Laterality: N/A;   TUBAL LIGATION  1979   TYMPANOSTOMY TUBE PLACEMENT Bilateral     Current Medications: Current Meds  Medication Sig   aspirin EC 81 MG tablet Take 1 tablet (81 mg total) by mouth daily. Swallow whole. (Patient taking differently: Take 81 mg by mouth at bedtime. Swallow whole.)   Cholecalciferol (VITAMIN D) 50 MCG (2000 UT) tablet Take 2,000 Units by mouth daily.   Cyanocobalamin (B-12) 5000 MCG CAPS Take 5,000 mcg by mouth daily.   ferrous sulfate 325 (65 FE) MG tablet Take 325 mg by mouth daily with breakfast.   HUMALOG KWIKPEN 100 UNIT/ML KwikPen Inject 0-10 Units into the skin 3 (three) times daily as needed (high blood sugar). Sliding Scale   Insulin Glargine (LANTUS) 100 UNIT/ML Solostar Pen Inject 13 Units into the skin daily. (Patient taking differently: Inject 10-12 Units into the skin in the morning.)   Menthol-Methyl Salicylate (SALONPAS PAIN RELIEF PATCH) PTCH Apply 1 patch topically daily as needed (patch).   montelukast (SINGULAIR) 10 MG tablet Take 10 mg by mouth at bedtime.   Polyethyl Glycol-Propyl Glycol (SYSTANE OP) Place 1 drop into both eyes daily as needed (dry eyes).   protein supplement shake (PREMIER PROTEIN) LIQD Take 11 oz by mouth 2 (two) times daily. 30 mg of protein   simvastatin (ZOCOR) 40 MG tablet Take 40 mg by mouth at bedtime.     Allergies:   Cefaclor, Tetracycline, Vancomycin, Augmentin [amoxicillin-pot clavulanate], Nsaids, Quinapril hcl, Sulfamethoxazole-trimethoprim, and Prednisone   Social History   Socioeconomic History   Marital status: Divorced    Spouse name: Not on file   Number of children: 2   Years of education:  [employer]   Highest education level: Not on file  Occupational History   Occupation: Retired    Fish farm manager: RETIRED  Tobacco Use   Smoking status: Never   Smokeless tobacco: Never  Vaping Use   Vaping Use: Never used  Substance and Sexual Activity   Alcohol use: Yes    Alcohol/week: 2.0 standard drinks of alcohol    Types: 2 Glasses of wine per week   Drug use: Never   Sexual activity: Never  Other Topics Concern   Not on file  Social History Narrative   Patient lives at home alone.   Caffeine  Use:    Social Determinants of Health   Financial Resource Strain: Not on file  Food Insecurity: Not on file  Transportation Needs: Not on file  Physical Activity: Not on file  Stress: Not on file  Social Connections: Not on file    Family History: The patient's family history includes Cancer in her mother; Heart Problems in her father.  ROS:   Please see the history of present illness.    All other systems reviewed and are negative.  EKGs/Labs/Other Studies Reviewed:    The following studies were reviewed today: [facility]  TAVR OPERATIVE NOTE     Date of Procedure:                02/06/2023   Preoperative Diagnosis:      Severe Aortic Stenosis    Postoperative Diagnosis:    Same    Procedure:        Transcatheter Aortic Valve Replacement - Transfemoral Approach             Edwards Sapien 3 Resilia THV (size 20 mm, model # 9755RLS, serial # [id number] )              Co-Surgeons:                        Danielle Common, MD and Danielle Sciara, MD Anesthesiologist:                  Danielle Battiest, MD   Echocardiographer:              Danielle Haskell, MD   Pre-operative Echo Findings: Severe aortic stenosis Normal left ventricular systolic function   Post-operative Echo Findings: No paravalvular leak Normal left ventricular systolic function   Left Heart Catheterization Findings: Left ventricular end-diastolic pressure of 123456 ____________    Echocardiogram 02/07/23:   1. The aortic valve has been replaced by a 20 mm Edwards Sapien valve.  Aortic valve regurgitation is not visualized. Effective orifice area, by  VTI measures 1.28 cm. Aortic valve mean gradient measures 11.0 mmHg. Peak  gradient 18 mm Hg.   2. Left ventricular ejection fraction, by estimation, is 55 to 60%. The  left ventricle has normal function. The left ventricle has no regional  wall motion abnormalities. Left ventricular diastolic function could not  be evaluated.   3. Right ventricular systolic function is hyperdynamic. The right  ventricular size is normal. There is moderately elevated pulmonary artery  systolic pressure. The estimated right ventricular systolic pressure is  0000000 mmHg.   4. 2D mitral valve area 3.47 cm2. The mitral valve is degenerative. Mild  to moderate mitral valve regurgitation. Mild mitral stenosis. The mean  mitral valve gradient is 6.0 mmHg with average heart rate of 84 bpm.  Severe mitral annular calcification.   5. Tricuspid valve regurgitation is moderate.   6. The inferior vena cava is normal in size with <50% respiratory  variability, suggesting right atrial pressure of 8 mmHg.   Comparison(s): Successful TAVR placement with acceptable gradients and no  PVL.    Recent Labs: 02/02/2023: ALT 14 02/07/2023: BUN 9; Creatinine, Ser 1.02; Hemoglobin 10.3; Magnesium 1.7; Platelets 157; Potassium 3.5; Sodium 134   Recent Lipid Panel No results found for: "CHOL", "TRIG", "HDL", "CHOLHDL", "VLDL", "LDLCALC", "LDLDIRECT"   Physical Exam:    VS:  BP (!) 144/50   Pulse 89   Ht 5' (1.524 m)   Wt 102 lb 3.2 oz (  46.4 kg)   SpO2 94%   BMI 19.96 kg/m     Wt Readings from Last 3 Encounters:  02/14/23 102 lb 3.2 oz (46.4 kg)  02/07/23 105 lb 6.1 oz (47.8 kg)  01/29/23 102 lb (46.3 kg)    General: Well developed, well nourished, NAD Lungs:Clear to ausculation bilaterally. No wheezes, rales, or rhonchi. Breathing is  unlabored. Cardiovascular: RRR with S1 S2. Soft flow murmur Extremities: No edema. Groin sites stable.  Neuro: Alert and oriented. No focal deficits. No facial asymmetry. MAE spontaneously. Psych: Responds to questions appropriately with normal affect.    ASSESSMENT/PLAN:    Severe AS: s/p successful TAVR with a 20 mm Edwards Sapien 3 THV via the TF approach on 02/06/23. Post operative echo with stable valve function with no PVL. Groin sites remain stable with no evidence of bleeding or hematoma. Continue ASA 81mg  monotherapy. Lifelong dental SBE with Amoxicillin. Plan one month f/u with echo.    Bradycardia s/p PPM placement 2020: No issues. Last interrogation 12/23 with normal device function and battery life. No changes.    ICH secondary to fall: No recent falls. Walks with four prong cane for stability.    Moderate MR: Continue to follow with surveillance imaging. Denies symptoms.    DM1: Resumed on home regimen at discharge.    Incidental findings: New clustered nodularity in the left lower lobe with some associated peripheral airspace consolidation in the posteromedial left lower lobe. Findings are favored to be of infectious or inflammatory etiology, however, close attention on short-term repeat noncontrast chest CT is recommended in 2 months to ensure the regression of these findings, potentially after trial of antimicrobial therapy. (due around April 22nd)-per AT defer to PCP    Medication Adjustments/Labs and Tests Ordered: Current medicines are reviewed at length with the patient today.  Concerns regarding medicines are outlined above.  No orders of the defined types were placed in this encounter.  No orders of the defined types were placed in this encounter.   Patient Instructions  Medication Instructions:  Your physician recommends that you continue on your current medications as directed. Please refer to the Current Medication list given to you today.  *If you need a refill  on your cardiac medications before your next appointment, please call your pharmacy*   Lab Work: NONE If you have labs (blood work) drawn today and your tests are completely normal, you will receive your results only by: [place] (if you have MyChart) OR A paper copy in the mail If you have any lab test that is abnormal or we need to change your treatment, we will call you to review the results.   Testing/Procedures: NONE   Follow-Up: At [facility], you and your health needs are our priority.  As part of our continuing mission to provide you with exceptional heart care, we have created designated Provider Care Teams.  These Care Teams include your primary Cardiologist (physician) and Advanced Practice Providers (APPs -  Physician Assistants and Nurse Practitioners) who all work together to provide you with the care you need, when you need it.  We recommend signing up for the patient portal called "MyChart".  Sign up information is provided on this After Visit Summary.  MyChart is used to connect with patients for Virtual Visits (Telemedicine).  Patients are able to view lab/test results, encounter notes, upcoming appointments, etc.  Non-urgent messages can be sent to your provider as well.   To learn more about what you can  do with MyChart, go to [email or web address].    Your next appointment:   KEEP SCHEDULED FOLLOW-UP   Signed, Kathyrn Drown, NP  02/15/2023 3:37 PM    [facility]

## 2023-02-14 NOTE — Patient Instructions (Signed)
Medication Instructions:  Your physician recommends that you continue on your current medications as directed. Please refer to the Current Medication list given to you today.  *If you need a refill on your cardiac medications before your next appointment, please call your pharmacy*   Lab Work: NONE If you have labs (blood work) drawn today and your tests are completely normal, you will receive your results only by: MyChart Message (if you have MyChart) OR A paper copy in the mail If you have any lab test that is abnormal or we need to change your treatment, we will call you to review the results.   Testing/Procedures: NONE   Follow-Up: At [facility], you and your health needs are our priority.  As part of our continuing mission to provide you with exceptional heart care, we have created designated Provider Care Teams.  These Care Teams include your primary Cardiologist (physician) and Advanced Practice Providers (APPs -  Physician Assistants and Nurse Practitioners) who all work together to provide you with the care you need, when you need it.  We recommend signing up for the patient portal called "MyChart".  Sign up information is provided on this After Visit Summary.  MyChart is used to connect with patients for Virtual Visits (Telemedicine).  Patients are able to view lab/test results, encounter notes, upcoming appointments, etc.  Non-urgent messages can be sent to your provider as well.   To learn more about what you can do with MyChart, go to [email or web address].    Your next appointment:   KEEP SCHEDULED FOLLOW-UP 

## 2023-02-15 ENCOUNTER — Other Ambulatory Visit: Payer: Self-pay | Admitting: Cardiology

## 2023-02-15 LAB — CUP PACEART REMOTE DEVICE CHECK
Date Time Interrogation Session: 20240320070109
Implantable Lead Connection Status: 753985
Implantable Lead Connection Status: 753985
Implantable Lead Implant Date: 20200316
Implantable Lead Implant Date: 20200316
Implantable Lead Location: 753859
Implantable Lead Location: 753860
Implantable Lead Model: 377
Implantable Lead Model: 377
Implantable Lead Serial Number: 81099636
Implantable Lead Serial Number: 81165051
Implantable Pulse Generator Implant Date: 20200316
Pulse Gen Model: 407145
Pulse Gen Serial Number: 69557134

## 2023-02-15 MED ORDER — AZITHROMYCIN 500 MG PO TABS: 500.0000 mg | ORAL_TABLET | ORAL | 3 refills | Status: DC | PRN

## 2023-03-13 NOTE — Progress Notes (Signed)
[facility]   MULTIDISCIPLINARY HEART VALVE CLINIC                                     Cardiology Office Note:    Date:  03/16/2023   ID:  Danielle Clarke, DOB 1941-03-03, MRN [id number]  PCP:  Danielle Bowl, MD  [facility] Cardiologist:  Danielle Pyo, MD  [facility] Electrophysiologist:  None   Referring MD: Danielle Bowl, MD   Chief Complaint  Patient presents with   Follow-up    1 month s/p TAVR   History of Present Illness:    Danielle Clarke is a 82 y.o. female with a hx of symptomatic bradycardia s/p PPM placement 2020, ICH secondary to fall, moderate MR, DM2 on insulin, HLD, and severe aortic stenosis who presented to [facility] on 02/06/23 for planned TAVR and is being seen today for 1 month follow up.    [name] was recently seen by Dr. Lynnette Clarke on 12/14/22 in TAVR consultation. She had been previously followed by Dr. Katrinka Clarke for many years with her most recent echocardiogram demonstrating severe aortic stenosis. She was noted to have developed some exertional fatigue over several months however felt quite well during her consult and wished to monitor her symptoms and contact our team if she wished to proceed sooner. She then called back to schedule a follow up visit with intent to proceed with further TAVR workup.    She underwent cardiac catheterization which showed minimally obstructive coronary artery disease. She was then  evaluated by the multidisciplinary valve team and felt to have severe, symptomatic aortic stenosis and to be a suitable candidate and is now s/p successful TAVR with a 20 mm Edwards Sapien 3 THV via the TF approach on 02/06/23. She was continued on ASA  monotherapy.    Since she was last seen, she has been doing very well with no chest pain, palpitations, SOB, LE edema, bleeding in stool or urine, dizziness, or syncope. She continues to stay busy running errands with her friend. She only uses a cane for stability. Says they go  to [facility] and she walks all the aisles.    Past Medical History:  Diagnosis Date   Aortic stenosis, mild    Arthritis    Asthmatic bronchitis    with colds per patient   Carotid artery disease    40-59% bilateral ICA stenosis   Cataract    Cutaneous abscess of right foot    Glaucoma    Heart murmur    Dr Danielle Clarke is her  cardiologist.    Hyperlipemia    Hypertension    Dr. 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 Lynnette Clarke and Dr. Leafy Clarke   Syncope    Type 1 diabetes mellitus     Past Surgical History:  Procedure Laterality Date   ANKLE FRACTURE SURGERY Left 2001   steel plate and 3 screws    ANTERIOR CERVICAL CORPECTOMY N/A 07/31/2014   Procedure: Cervical Four to Cervical Six Corpectomy;  Surgeon: Danielle Cassis, MD;  Location: [facility];  Service: Neurosurgery;  Laterality: N/A;  C4 to C6 Corpectomy   BREAST SURGERY  1988   CATARACT EXTRACTION W/ INTRAOCULAR LENS  IMPLANT, BILATERAL  Bilateral 2011   right and then left   EYE SURGERY     FRACTURE SURGERY     HAMMER TOE SURGERY  1998   I & D EXTREMITY Right 09/27/2018   Procedure: IRRIGATION AND DEBRIDEMENT RIGHT FOOT;  Surgeon: Danielle Mustard, MD;  Location: [facility];  Service: Orthopedics;  Laterality: Right;   INTRAOPERATIVE TRANSTHORACIC ECHOCARDIOGRAM N/A 02/06/2023   Procedure: INTRAOPERATIVE TRANSTHORACIC ECHOCARDIOGRAM;  Surgeon: Danielle Pyo, MD;  Location: [facility] OR;  Service: Open Heart Surgery;  Laterality: N/A;   JOINT REPLACEMENT     PACEMAKER IMPLANT N/A 02/10/2019   Procedure: PACEMAKER IMPLANT;  Surgeon: Danielle Maw, MD;  Location: [facility] INVASIVE CV LAB;  Service: Cardiovascular;  Laterality: N/A;   RIGHT/LEFT HEART CATH AND CORONARY ANGIOGRAPHY N/A 01/11/2023   Procedure: RIGHT/LEFT HEART CATH AND CORONARY ANGIOGRAPHY;  Surgeon: Danielle Pyo, MD;  Location: [facility] INVAS[facility] CV LAB;   Service: Cardiovascular;  Laterality: N/A;   TONSILLECTOMY AND ADENOIDECTOMY  1948   TOTAL SHOULDER REPLACEMENT  2010   right shoulder    TRANSCATHETER AORTIC VALVE REPLACEMENT, TRANSFEMORAL N/A 02/06/2023   Procedure: Transcatheter Aortic Valve Replacement, Transfemoral;  Surgeon: Danielle Pyo, MD;  Location: [facility] OR;  Service: Open Heart Surgery;  Laterality: N/A;   TUBAL LIGATION  1979   TYMPANOSTOMY TUBE PLACEMENT Bilateral     Current Medications: Current Meds  Medication Sig   aspirin EC 81 MG tablet Take 1 tablet (81 mg total) by mouth daily. Swallow whole.   azithromycin (ZITHROMAX) 500 MG tablet Take 1 tablet (500 mg total) by mouth as needed. Take one tablet by mouth one hour prior to dental cleanings.   Cholecalciferol (VITAMIN D) 50 MCG (2000 UT) tablet Take 2,000 Units by mouth daily.   Cyanocobalamin (B-12) 5000 MCG CAPS Take 5,000 mcg by mouth daily.   HUMALOG KWIKPEN 100 UNIT/ML KwikPen Inject 0-10 Units into the skin 3 (three) times daily as needed (high blood sugar). Sliding Scale   Insulin Glargine (LANTUS) 100 UNIT/ML Solostar Pen Inject 13 Units into the skin daily. (Patient taking differently: Inject 10-12 Units into the skin in the morning.)   Menthol-Methyl Salicylate (SALONPAS PAIN RELIEF PATCH) PTCH Apply 1 patch topically daily as needed (patch).   Polyethyl Glycol-Propyl Glycol (SYSTANE OP) Place 1 drop into both eyes daily as needed (dry eyes).   protein supplement shake (PREMIER PROTEIN) LIQD Take 11 oz by mouth 2 (two) times daily. 30 mg of protein   simvastatin (ZOCOR) 40 MG tablet Take 40 mg by mouth at bedtime.     Allergies:   Cefaclor, Tetracycline, Vancomycin, Augmentin [amoxicillin-pot clavulanate], Nsaids, Quinapril hcl, Sulfamethoxazole-trimethoprim, and Prednisone   Social History   Socioeconomic History   Marital status: Divorced    Spouse name: Not on file   Number of children: 2   Years of education: [employer]   Highest education level:  Not on file  Occupational History   Occupation: Retired    Associate Professor: RETIRED  Tobacco Use   Smoking status: Never   Smokeless tobacco: Never  Vaping Use   Vaping Use: Never used  Substance and Sexual Activity   Alcohol use: Yes    Alcohol/week: 2.0 standard drinks of alcohol    Types: 2 Glasses of wine per week   Drug use: Never   Sexual activity: Never  Other Topics Concern   Not on file  Social History Narrative   Patient lives at home alone.   Caffeine Use:    Social  Determinants of Health   Financial Resource Strain: Not on file  Food Insecurity: Not on file  Transportation Needs: Not on file  Physical Activity: Not on file  Stress: Not on file  Social Connections: Not on file    Family History: The patient's family history includes Cancer in her mother; Heart Problems in her father.  ROS:   Please see the history of present illness.    All other systems reviewed and are negative.  EKGs/Labs/Other Studies Reviewed:    The following studies were reviewed today:  Echocardiogram 03/16/23:   1. Left ventricular ejection fraction, by estimation, is 50 to 55%. The  left ventricle has low normal function. The left ventricle has no regional  wall motion abnormalities. Left ventricular diastolic parameters are  consistent with Grade I diastolic  dysfunction (impaired relaxation).   2. Right ventricular systolic function is normal. The right ventricular  size is normal. There is normal pulmonary artery systolic pressure. The  estimated right ventricular systolic pressure is 35.0 mmHg.   3. The mitral valve is normal in structure. Trivial mitral valve  regurgitation. No evidence of mitral stenosis. Moderate mitral annular  calcification.   4. Tricuspid valve regurgitation is mild to moderate.   5. The aortic valve has been repaired/replaced. Aortic valve  regurgitation is mild. No aortic stenosis is present. There is a 20 mm  S3UR valve present in the aortic position.  Procedure Date: 02/06/23. Echo  findings are consistent with perivalvular leak  of the aortic prosthesis. Aortic valve area, by VTI measures 1.08 cm.  Aortic valve mean gradient measures 13.0 mmHg. Aortic valve Vmax measures  2.34 m/s.   6. The inferior vena cava is normal in size with greater than 50%  respiratory variability, suggesting right atrial pressure of 3 mmHg.    HEART AND VASCULAR CENTER  TAVR OPERATIVE NOTE     Date of Procedure:                02/06/2023   Preoperative Diagnosis:      Severe Aortic Stenosis    Postoperative Diagnosis:    Same    Procedure:        Transcatheter Aortic Valve Replacement - Transfemoral Approach             Edwards Sapien 3 Resilia THV (size 20 mm, model # 9755RLS, serial # [id number] )              Co-Surgeons:                        Eugenio Hoes, MD and Alverda Skeans, MD Anesthesiologist:                  Marcene Duos, MD   Echocardiographer:              Riley Lam, MD   Pre-operative Echo Findings: Severe aortic stenosis Normal left ventricular systolic function   Post-operative Echo Findings: No paravalvular leak Normal left ventricular systolic function   Left Heart Catheterization Findings: Left ventricular end-diastolic pressure of ____________   Echocardiogram 02/07/23:   1. The aortic valve has been replaced by a 20 mm Edwards Sapien valve.  Aortic valve regurgitation is not visualized. Effective orifice area, by  VTI measures 1.28 cm. Aortic valve mean gradient measures 11.0 mmHg. Peak  gradient 18 mm Hg.   2. Left ventricular ejection fraction, by estimation, is 55 to 60%. The  left ventricle has normal function.  The left ventricle has no regional  wall motion abnormalities. Left ventricular diastolic function could not  be evaluated.   3. Right ventricular systolic function is hyperdynamic. The right  ventricular size is normal. There is moderately elevated pulmonary artery  systolic  pressure. The estimated right ventricular systolic pressure is  52.6 mmHg.   4. 2D mitral valve area 3.47 cm2. The mitral valve is degenerative. Mild  to moderate mitral valve regurgitation. Mild mitral stenosis. The mean  mitral valve gradient is 6.0 mmHg with average heart rate of 84 bpm.  Severe mitral annular calcification.   5. Tricuspid valve regurgitation is moderate.   6. The inferior vena cava is normal in size with <50% respiratory  variability, suggesting right atrial pressure of 8 mmHg.   Comparison(s): Successful TAVR placement with acceptable gradients and no  PVL.   EKG:  EKG is not ordered today.    Recent Labs: 02/02/2023: ALT 14 02/07/2023: BUN 9; Creatinine, Ser 1.02; Hemoglobin 10.3; Magnesium 1.7; Platelets 157; Potassium 3.5; Sodium 134  Recent Lipid Panel No results found for: "CHOL", "TRIG", "HDL", "CHOLHDL", "VLDL", "LDLCALC", "LDLDIRECT"  Physical Exam:    VS:  BP (!) 140/60   Pulse 64   Ht 5' (1.524 m)   Wt 101 lb 3.2 oz (45.9 kg)   SpO2 98%   BMI 19.76 kg/m     Wt Readings from Last 3 Encounters:  03/16/23 101 lb 3.2 oz (45.9 kg)  02/14/23 102 lb 3.2 oz (46.4 kg)  02/07/23 105 lb 6.1 oz (47.8 kg)    General: Well developed, well nourished, NAD Lungs:Clear to ausculation bilaterally. No wheezes, rales, or rhonchi. Breathing is unlabored. Cardiovascular: RRR with S1 S2. No murmurs Extremities: No edema.  Neuro: Alert and oriented. No focal deficits. No facial asymmetry. MAE spontaneously. Psych: Responds to questions appropriately with normal affect.    ASSESSMENT/PLAN:    Severe AS: Patient doing well with NYHA class I symptoms s/p successful TAVR with a 20 mm Edwards Sapien 3 THV via the TF approach on 02/06/23. Echo today with consistent with perivalvular leak  of the aortic prosthesis with AVA by VTI at 1.08 cm., mean gradient at and peak at .  Continue ASA  monotherapy. Lifelong dental SBE with Amoxicillin. Plan regular  cardiology follow up and our team in one year with echo.   Bradycardia s/p PPM placement 2020: No issues. Last interrogation 3/20 with normal device functioning. No changes.    ICH secondary to fall: No recent falls. Walks with four prong cane for stability.    Moderate MR: Continue to follow with surveillance imaging. Denies symptoms.    DM1: Resumed on home regimen at discharge.    Incidental findings: New clustered nodularity in the left lower lobe with some associated peripheral airspace consolidation in the posteromedial left lower lobe. Findings are favored to be of infectious or inflammatory etiology, however, close attention on short-term repeat noncontrast chest CT is recommended in 2 months to ensure the regression of these findings, potentially after trial of antimicrobial therapy. (due around April 22nd)-per AT defer to PCP  Medication Adjustments/Labs and Tests Ordered: Current medicines are reviewed at length with the patient today.  Concerns regarding medicines are outlined above.  No orders of the defined types were placed in this encounter.  No orders of the defined types were placed in this encounter.   Patient Instructions  Medication Instructions:  Your physician recommends that you continue on your current medications as directed. Please refer to  the Current Medication list given to you today.  *If you need a refill on your cardiac medications before your next appointment, please call your pharmacy*   Lab Work: NONE If you have labs (blood work) drawn today and your tests are completely normal, you will receive your results only by: MyChart Message (if you have MyChart) OR A paper copy in the mail If you have any lab test that is abnormal or we need to change your treatment, we will call you to review the results.   Testing/Procedures: NONE   Follow-Up: At [facility], you and your health needs are our priority.  As part of our continuing mission  to provide you with exceptional heart care, we have created designated Provider Care Teams.  These Care Teams include your primary Cardiologist (physician) and Advanced Practice Providers (APPs -  Physician Assistants and Nurse Practitioners) who all work together to provide you with the care you need, when you need it.  We recommend signing up for the patient portal called "MyChart".  Sign up information is provided on this After Visit Summary.  MyChart is used to connect with patients for Virtual Visits (Telemedicine).  Patients are able to view lab/test results, encounter notes, upcoming appointments, etc.  Non-urgent messages can be sent to your provider as well.   To learn more about what you can do with MyChart, go to [email or web address].    Your next appointment:   4 month(s)  Provider:   Orbie Pyo, MD     Signed, [name], NP  03/16/2023 2:20 PM    [facility]

## 2023-03-16 ENCOUNTER — Ambulatory Visit (HOSPITAL_COMMUNITY): Payer: Medicare PPO | Attending: Cardiology

## 2023-03-16 ENCOUNTER — Ambulatory Visit (INDEPENDENT_AMBULATORY_CARE_PROVIDER_SITE_OTHER): Payer: Medicare PPO | Admitting: Cardiology

## 2023-03-16 VITALS — BP 140/60 | HR 64 | Ht 60.0 in | Wt 101.2 lb

## 2023-03-16 DIAGNOSIS — I442 Atrioventricular block, complete: Secondary | ICD-10-CM

## 2023-03-16 DIAGNOSIS — I10 Essential (primary) hypertension: Secondary | ICD-10-CM

## 2023-03-16 DIAGNOSIS — Z952 Presence of prosthetic heart valve: Secondary | ICD-10-CM

## 2023-03-16 DIAGNOSIS — I350 Nonrheumatic aortic (valve) stenosis: Secondary | ICD-10-CM | POA: Diagnosis not present

## 2023-03-16 DIAGNOSIS — N181 Chronic kidney disease, stage 1: Secondary | ICD-10-CM

## 2023-03-16 DIAGNOSIS — E1022 Type 1 diabetes mellitus with diabetic chronic kidney disease: Secondary | ICD-10-CM | POA: Insufficient documentation

## 2023-03-16 LAB — ECHOCARDIOGRAM COMPLETE
AR max vel: 1.05 cm2
AV Area VTI: 1.08 cm2
AV Area mean vel: 0.98 cm2
AV Mean grad: 13 mmHg
AV Peak grad: 22 mmHg
Ao pk vel: 2.35 m/s
Area-P 1/2: 3.19 cm2
Calc EF: 53.9 %
P 1/2 time: 223 msec
Radius: 0.28 cm
S' Lateral: 2.8 cm
Single Plane A2C EF: 51.6 %
Single Plane A4C EF: 54.4 %

## 2023-03-16 NOTE — Patient Instructions (Signed)
Medication Instructions:  Your physician recommends that you continue on your current medications as directed. Please refer to the Current Medication list given to you today.  *If you need a refill on your cardiac medications before your next appointment, please call your pharmacy*   Lab Work: NONE If you have labs (blood work) drawn today and your tests are completely normal, you will receive your results only by: MyChart Message (if you have MyChart) OR A paper copy in the mail If you have any lab test that is abnormal or we need to change your treatment, we will call you to review the results.   Testing/Procedures: NONE   Follow-Up: At [facility], you and your health needs are our priority.  As part of our continuing mission to provide you with exceptional heart care, we have created designated Provider Care Teams.  These Care Teams include your primary Cardiologist (physician) and Advanced Practice Providers (APPs -  Physician Assistants and Nurse Practitioners) who all work together to provide you with the care you need, when you need it.  We recommend signing up for the patient portal called "MyChart".  Sign up information is provided on this After Visit Summary.  MyChart is used to connect with patients for Virtual Visits (Telemedicine).  Patients are able to view lab/test results, encounter notes, upcoming appointments, etc.  Non-urgent messages can be sent to your provider as well.   To learn more about what you can do with MyChart, go to [email or web address].    Your next appointment:   4 month(s)  Provider:   Orbie Pyo, MD

## 2023-03-20 DIAGNOSIS — I10 Essential (primary) hypertension: Secondary | ICD-10-CM | POA: Diagnosis not present

## 2023-03-20 DIAGNOSIS — E1065 Type 1 diabetes mellitus with hyperglycemia: Secondary | ICD-10-CM | POA: Diagnosis not present

## 2023-03-20 DIAGNOSIS — E162 Hypoglycemia, unspecified: Secondary | ICD-10-CM | POA: Diagnosis not present

## 2023-03-20 DIAGNOSIS — M810 Age-related osteoporosis without current pathological fracture: Secondary | ICD-10-CM | POA: Diagnosis not present

## 2023-03-20 DIAGNOSIS — E7800 Pure hypercholesterolemia, unspecified: Secondary | ICD-10-CM | POA: Diagnosis not present

## 2023-03-20 DIAGNOSIS — I639 Cerebral infarction, unspecified: Secondary | ICD-10-CM | POA: Diagnosis not present

## 2023-03-22 ENCOUNTER — Other Ambulatory Visit (HOSPITAL_COMMUNITY): Payer: Medicare PPO

## 2023-03-26 NOTE — Progress Notes (Signed)
Remote pacemaker transmission.   

## 2023-04-09 ENCOUNTER — Ambulatory Visit: Payer: Medicare PPO | Admitting: Internal Medicine

## 2023-04-19 ENCOUNTER — Encounter: Payer: Self-pay | Admitting: Nurse Practitioner

## 2023-04-19 ENCOUNTER — Ambulatory Visit: Payer: Medicare PPO | Attending: Internal Medicine | Admitting: Nurse Practitioner

## 2023-04-19 VITALS — BP 120/66 | HR 94 | Ht 60.0 in | Wt 99.0 lb

## 2023-04-19 DIAGNOSIS — I10 Essential (primary) hypertension: Secondary | ICD-10-CM

## 2023-04-19 DIAGNOSIS — R001 Bradycardia, unspecified: Secondary | ICD-10-CM

## 2023-04-19 DIAGNOSIS — I350 Nonrheumatic aortic (valve) stenosis: Secondary | ICD-10-CM

## 2023-04-19 DIAGNOSIS — Z952 Presence of prosthetic heart valve: Secondary | ICD-10-CM

## 2023-04-19 DIAGNOSIS — I2510 Atherosclerotic heart disease of native coronary artery without angina pectoris: Secondary | ICD-10-CM

## 2023-04-19 DIAGNOSIS — E785 Hyperlipidemia, unspecified: Secondary | ICD-10-CM | POA: Diagnosis not present

## 2023-04-19 NOTE — Patient Instructions (Signed)
Medication Instructions:   Your physician recommends that you continue on your current medications as directed. Please refer to the Current Medication list given to you today.   *If you need a refill on your cardiac medications before your next appointment, please call your pharmacy*   Lab Work:  TODAY!!!! CMET/LIPID  If you have labs (blood work) drawn today and your tests are completely normal, you will receive your results only by: MyChart Message (if you have MyChart) OR A paper copy in the mail If you have any lab test that is abnormal or we need to change your treatment, we will call you to review the results.   Testing/Procedures:  None ordered.   Follow-Up: At [facility], you and your health needs are our priority.  As part of our continuing mission to provide you with exceptional heart care, we have created designated Provider Care Teams.  These Care Teams include your primary Cardiologist (physician) and Advanced Practice Providers (APPs -  Physician Assistants and Nurse Practitioners) who all work together to provide you with the care you need, when you need it.  We recommend signing up for the patient portal called "MyChart".  Sign up information is provided on this After Visit Summary.  MyChart is used to connect with patients for Virtual Visits (Telemedicine).  Patients are able to view lab/test results, encounter notes, upcoming appointments, etc.  Non-urgent messages can be sent to your provider as well.   To learn more about what you can do with MyChart, go to [email or web address].    Your next appointment:   6 month(s)  Provider:   Orbie Pyo, MD     Other Instructions  The EP team should be calling you to schedule appointment.

## 2023-04-19 NOTE — Progress Notes (Signed)
Cardiology Office Note:    Date:  04/19/2023   ID:  Danielle Clarke, DOB 09/15/1941, MRN [id number]  PCP:  Ollen Bowl, MD   [facility] Providers Cardiologist:  Orbie Pyo, MD     Referring MD: Ollen Bowl, MD   Chief Complaint:   History of Present Illness:    Danielle Clarke is a pleasant 82 y.o. female with a hx of symptomatic bradycardia s/p PPM placement 2020, ICH secondary to fall, moderate MR, type I DM on insulin, HLD, and severe aortic stenosis s/p TAVR.   Seen by Dr. Lynnette Caffey 12/14/2022 for TAVR consult.  She had been followed by Dr. Katrinka Blazing for many years and most recent echo demonstrated severe aortic stenosis.  She had developed some exertional fatigue over the previous few months however she felt quite well during her consult and wished to monitor symptoms and follow-up if symptoms progressed.  She then called back to request proceeding with TAVR workup.  She underwent cardiac catheterization which revealed minimally obstructive coronary artery disease.  She developed more symptoms and underwent successful TAVR with a 20 mm Edwards SAPIEN 3 THV via the TF approach on 02/06/2023.  She was continued on aspirin 81 mg monotherapy.  Most recent echo 03/16/2023 revealed paravalvular leak with AVA by VTI at 1.08 cm, mean gradient at 13 mmHg and peak at 22 mmHg.  She was seen in follow-up by [name], NP on 03/16/2023 and was doing well at that time.  Today, she is here with her caregiver. She reports she is feeling overall well, she gets tired easily and falls asleep easily despite sleeping well each night. Her caregiver spends several hours with her 3 to 4 days/week.  They go out shopping together and patient reports she is able to walk for a long time around the store without shortness of breath or chest pain. Has one cat that is still living, previously had 6 cats. Her cat knows when her blood sugar is low and will not leave her lap until it improves.   Reports that she eats small amounts throughout the day. Checks her blood sugar frequently.  We had a long discussion about appropriate nutrition in the setting of diabetes and age. She denies chest pain, shortness of breath, orthopnea, PND, presyncope, syncope, edema. No specific cardiac concerns today.   Past Medical History:  Diagnosis Date   Aortic stenosis, mild    Arthritis    Asthmatic bronchitis    with colds per patient   Carotid artery disease (HCC)    40-59% bilateral ICA stenosis   Cataract    Cutaneous abscess of right foot    Glaucoma    Heart murmur    Dr Katrinka Blazing is her  cardiologist.    Hyperlipemia    Hypertension    Dr. 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 Lynnette Caffey and Dr. Leafy Ro   Syncope    Type 1 diabetes mellitus [facility])     Past Surgical History:  Procedure Laterality Date   ANKLE FRACTURE SURGERY Left 2001   steel plate and 3 screws    ANTERIOR CERVICAL CORPECTOMY N/A 07/31/2014   Procedure: Cervical Four to Cervical Six Corpectomy;  Surgeon: Karn Cassis, MD;  Location: [facility] NEUR[facility]S;  Service: Neurosurgery;  Laterality: N/A;  C4 to C6  Corpectomy   BREAST SURGERY  1988   CATARACT EXTRACTION W/ INTRAOCULAR LENS  IMPLANT, BILATERAL Bilateral 2011   right and then left   EYE SURGERY     FRACTURE SURGERY     HAMMER TOE SURGERY  1998   I & D EXTREMITY Right 09/27/2018   Procedure: IRRIGATION AND DEBRIDEMENT RIGHT FOOT;  Surgeon: Nadara Mustard, MD;  Location: [facility] OR;  Service: Orthopedics;  Laterality: Right;   INTRAOPERATIVE TRANSTHORACIC ECHOCARDIOGRAM N/A 02/06/2023   Procedure: INTRAOPERATIVE TRANSTHORACIC ECHOCARDIOGRAM;  Surgeon: Orbie Pyo, MD;  Location[facility] OR;  Service: Open Heart Surgery;  Laterality: N/A;   JOINT REPLACEMENT     PACEMAKER IMPLANT N/A 02/10/2019   Procedure: PACEMAKER IMPLANT;  Surgeon: Marinus Maw, MD;  Location: [facility] INVASIVE CV LAB;  Service: Cardiovascular;  Laterality: N/A;   RIGHT/LEFT HEART CATH AND CORONARY ANGIOGRAPHY N/A 01/11/2023   Procedure: RIGHT/LEFT HEART CATH AND CORONARY ANGIOGRAPHY;  Surgeon: Orbie Pyo, MD;  Location: [facility] INV[facility] CV LAB;  Service: Cardiovascular;  Laterality: N/A;   TONSILLECTOMY AND ADENOIDECTOMY  1948   TOTAL SHOULDER REPLACEMENT  2010   right shoulder    TRANSCATHETER AORTIC VALVE REPLACEMENT, TRANSFEMORAL N/A 02/06/2023   Procedure: Transcatheter Aortic Valve Replacement, Transfemoral;  Surgeon: Orbie Pyo, MD;  Location: [facility] OR;  Service: Open Heart Surgery;  Laterality: N/A;   TUBAL LIGATION  1979   TYMPANOSTOMY TUBE PLACEMENT Bilateral     Current Medications: Current Meds  Medication Sig   aspirin EC 81 MG tablet Take 1 tablet (81 mg total) by mouth daily. Swallow whole.   azithromycin (ZITHROMAX) 500 MG tablet Take 1 tablet (500 mg total) by mouth as needed. Take one tablet by mouth one hour prior to dental cleanings.   Cholecalciferol (VITAMIN D) 50 MCG (2000 UT) tablet Take 2,000 Units by mouth daily.   Cyanocobalamin (B-12) 5000 MCG CAPS Take 5,000 mcg by mouth daily.   ferrous sulfate 325 (65 FE) MG tablet Take 325 mg by mouth daily with breakfast.   HUMALOG KWIKPEN 100 UNIT/ML KwikPen Inject 0-10 Units into the skin 3 (three) times daily as needed (high blood sugar). Sliding Scale   Insulin Glargine (LANTUS) 100 UNIT/ML Solostar Pen Inject 13 Units into the skin daily. (Patient taking differently: Inject 10-12 Units into the skin in the morning.)   Menthol-Methyl Salicylate (SALONPAS PAIN RELIEF PATCH) PTCH Apply 1 patch topically daily as needed (patch).   montelukast (SINGULAIR) 10 MG tablet Take 10 mg by mouth at bedtime.   Polyethyl Glycol-Propyl Glycol (SYSTANE OP) Place 1 drop into both eyes daily as needed (dry eyes).   protein supplement shake (PREMIER PROTEIN) LIQD Take 11 oz by mouth 2 (two) times daily. 30 mg  of protein   simvastatin (ZOCOR) 40 MG tablet Take 40 mg by mouth at bedtime.     Allergies:   Cefaclor, Tetracycline, Vancomycin, Alendronate sodium, Augmentin [amoxicillin-pot clavulanate], Nsaids, Quinapril hcl, Sulfamethoxazole-trimethoprim, and Prednisone   Social History   Socioeconomic History   Marital status: Divorced    Spouse name: Not on file   Number of children: 2   Years of education: [employer]   Highest education level: Not on file  Occupational History   Occupation: Retired    Associate Professor: RETIRED  Tobacco Use   Smoking status: Never   Smokeless tobacco: Never  Vaping Use   Vaping Use: Never used  Substance and Sexual Activity   Alcohol use: Yes    Alcohol/week: 2.0 standard drinks  of alcohol    Types: 2 Glasses of wine per week   Drug use: Never   Sexual activity: Never  Other Topics Concern   Not on file  Social History Narrative   Patient lives at home alone.   Caffeine Use:    Social Determinants of Health   Financial Resource Strain: Not on file  Food Insecurity: Not on file  Transportation Needs: Not on file  Physical Activity: Not on file  Stress: Not on file  Social Connections: Not on file     Family History: The patient's family history includes Cancer in her mother; Heart Problems in her father.  ROS:   Please see the history of present illness.    + fatigue All other systems reviewed and are negative.  Labs/Other Studies Reviewed:    The following studies were reviewed today:  Echo 03/16/23 1. Left ventricular ejection fraction, by estimation, is 50 to 55%. The  left ventricle has low normal function. The left ventricle has no regional  wall motion abnormalities. Left ventricular diastolic parameters are  consistent with Grade I diastolic  dysfunction (impaired relaxation).   2. Right ventricular systolic function is normal. The right ventricular  size is normal. There is normal pulmonary artery systolic pressure. The   estimated right ventricular systolic pressure is 35.0 mmHg.   3. The mitral valve is normal in structure. Trivial mitral valve  regurgitation. No evidence of mitral stenosis. Moderate mitral annular  calcification.   4. Tricuspid valve regurgitation is mild to moderate.   5. The aortic valve has been repaired/replaced. Aortic valve  regurgitation is mild. No aortic stenosis is present. There is a 20 mm  S3UR valve present in the aortic position. Procedure Date: 02/06/23. Echo  findings are consistent with perivalvular leak  of the aortic prosthesis. Aortic valve area, by VTI measures 1.08 cm.  Aortic valve mean gradient measures 13.0 mmHg. Aortic valve Vmax measures  2.34 m/s.   6. The inferior vena cava is normal in size with greater than 50%  respiratory variability, suggesting right atrial pressure of 3 mmHg.   R/LHC 01/11/23 1.  Minimal obstructive coronary artery disease. 2.  Mean RA pressure 5 mmHg, RV pressure of 41/-4 with RV end-diastolic pressure of 7 mmHg, mean wedge pressure of 8 mmHg, and mean PA pressure 20 mmHg with a Fick cardiac output of 4.7 L/min and a Fick cardiac index of 3.3 L/min/m.   Recommendation: Continue evaluation for aortic valve intervention.   Echo 12/06/22 1. Left ventricular ejection fraction, by estimation, is 55 to 60%. The  left ventricle has normal function. The left ventricle has no regional  wall motion abnormalities. There is mild left ventricular hypertrophy.  Left ventricular diastolic parameters  are consistent with Grade I diastolic dysfunction (impaired relaxation).  Elevated left ventricular end-diastolic pressure.   2. Right ventricular systolic function is normal. The right ventricular  size is normal. There is mildly elevated pulmonary artery systolic  pressure. The estimated right ventricular systolic pressure is 42.9 mmHg.   3. The mitral valve is abnormal. Moderate mitral valve regurgitation. No  evidence of mitral stenosis.  Moderate mitral annular calcification.   4. The aortic valve is calcified. Aortic valve regurgitation is moderate.  Severe aortic valve stenosis. Aortic regurgitation PHT measures 365 msec.  Aortic valve area, by VTI measures 0.50 cm. Aortic valve mean gradient  measures 36.5 mmHg. Aortic valve  Vmax measures 4.00 m/s. Peak gradient 64 mmHg. DI is 0.20.   5. The  inferior vena cava is normal in size with greater than 50%  respiratory variability, suggesting right atrial pressure of 3 mmHg.   Comparison(s): Changes from prior study are noted. 02/21/2022: LVEF 55-60%,  severe AS - mean gradient 35 mmHg.   Recent Labs: 02/02/2023: ALT 14 02/07/2023: BUN 9; Creatinine, Ser 1.02; Hemoglobin 10.3; Magnesium 1.7; Platelets 157; Potassium 3.5; Sodium 134  Recent Lipid Panel No results found for: "CHOL", "TRIG", "HDL", "CHOLHDL", "VLDL", "LDLCALC", "LDLDIRECT"   Risk Assessment/Calculations:           Physical Exam:    VS:  BP 120/66   Pulse 94   Ht 5' (1.524 m)   Wt 99 lb (44.9 kg)   SpO2 97%   BMI 19.33 kg/m     Wt Readings from Last 3 Encounters:  04/19/23 99 lb (44.9 kg)  03/16/23 101 lb 3.2 oz (45.9 kg)  02/14/23 102 lb 3.2 oz (46.4 kg)     GEN:  Thin, frail in no acute distress HEENT: Normal NECK: No JVD; No carotid bruits CARDIAC: RRR, soft systolic murmur. No rubs, gallops RESPIRATORY:  Clear to auscultation without rales, wheezing or rhonchi  ABDOMEN: Soft, non-tender, non-distended MUSCULOSKELETAL:  No edema; No deformity. 2+ pedal pulses, equal bilaterally SKIN: Warm and dry NEUROLOGIC:  Alert and oriented x 3 PSYCHIATRIC:  Normal affect   EKG:  EKG is not ordered today.        Diagnoses:    1. Essential hypertension   2. Nonrheumatic aortic valve stenosis   3. S/P TAVR (transcatheter aortic valve replacement)   4. Bradycardia   5. Coronary artery disease involving native coronary artery of native heart without angina pectoris   6. Hyperlipidemia LDL goal  <70    Assessment and Plan:     Severe AS s/p TAVR: Successful TAVR 02/06/2023 with stable valve gradients, small paravalvular leak with trivial MR on echo 03/16/23.  Reports fatigue has been consistent but slightly improved since TAVR. No concerns for worsening valve dysfunction. Plan for repeat echo 1 year post TAVR with follow-up after. Advised her to notify us of any concerning symptoms prior to next office visit.  Continue SBE prophylaxis prior to dental procedures.  Hypertension: BP is well-controlled.  CAD without angina: Minimal nonobstructive CAD on cath 01/11/23. She denies chest pain, dyspnea, or other symptoms concerning for angina.  No indication for further ischemic evaluation at this time. Lengthy discussion about the importance of LDL 70 or lower in the setting of mild CAD and diabetes.  Type 1 DM: Lengthy discussion about healthy diet in the setting of stable blood sugar.  She continues to drink ginger ale, fruit juice, and eat pudding.  Encouraged her to increase intake of complex carbohydrates and protein for more stable blood sugar trends. Management per endocrinology.   Hyperlipidemia: LDL 86 on 06/29/22. Admits she has not been compliant with simvastatin.  We discussed possibly changing to a more potent statin. She got a recent refill.  Advised we will consider options based on LDL results.  Bradycardia s/p PPM Implant: She underwent PPM implant in 2020 for symptomatic bradycardia.  HR is in the 90s today. No specific concerns today. Normal device function on remote device checks. Overdue for EP follow-up, we will send a message to scheduling.      Disposition: 6 months with Dr. Lynnette Caffey  Medication Adjustments/Labs and Tests Ordered: Current medicines are reviewed at length with the patient today.  Concerns regarding medicines are outlined above.  Orders Placed This Encounter  Procedures   Comp Met (CMET)   Lipid Profile   No orders of the defined types were placed in  this encounter.   Patient Instructions  Medication Instructions:   Your physician recommends that you continue on your current medications as directed. Please refer to the Current Medication list given to you today.   *If you need a refill on your cardiac medications before your next appointment, please call your pharmacy*   Lab Work:  TODAY!!!! CMET/LIPID  If you have labs (blood work) drawn today and your tests are completely normal, you will receive your results only by: MyChart Message (if you have MyChart) OR A paper copy in the mail If you have any lab test that is abnormal or we need to change your treatment, we will call you to review the results.   Testing/Procedures:  None ordered.   Follow-Up: At [facility], you and your health needs are our priority.  As part of our continuing mission to provide you with exceptional heart care, we have created designated Provider Care Teams.  These Care Teams include your primary Cardiologist (physician) and Advanced Practice Providers (APPs -  Physician Assistants and Nurse Practitioners) who all work together to provide you with the care you need, when you need it.  We recommend signing up for the patient portal called "MyChart".  Sign up information is provided on this After Visit Summary.  MyChart is used to connect with patients for Virtual Visits (Telemedicine).  Patients are able to view lab/test results, encounter notes, upcoming appointments, etc.  Non-urgent messages can be sent to your provider as well.   To learn more about what you can do with MyChart, go to [email or web address].    Your next appointment:   6 month(s)  Provider:   Orbie Pyo, MD     Other Instructions  The EP team should be calling you to schedule appointment.     Signed, Levi Aland, NP  04/19/2023 1:37 PM     [facility]

## 2023-04-20 LAB — LIPID PANEL
Chol/HDL Ratio: 2.6 ratio (ref 0.0–4.4)
Cholesterol, Total: 188 mg/dL (ref 100–199)
HDL: 71 mg/dL (ref >39)
LDL Chol Calc (NIH): 94 mg/dL (ref 0–99)
Triglycerides: 136 mg/dL (ref 0–149)
VLDL Cholesterol Cal: 23 mg/dL (ref 5–40)

## 2023-04-20 LAB — COMPREHENSIVE METABOLIC PANEL
ALT: 8 IU/L (ref 0–32)
AST: 14 IU/L (ref 0–40)
Albumin/Globulin Ratio: 1.7 (ref 1.2–2.2)
Albumin: 4.1 g/dL (ref 3.8–4.8)
Alkaline Phosphatase: 113 IU/L (ref 44–121)
BUN/Creatinine Ratio: 22 (ref 12–28)
BUN: 19 mg/dL (ref 8–27)
Bilirubin Total: 0.2 mg/dL (ref 0.0–1.2)
CO2: 28 mmol/L (ref 20–29)
Calcium: 9.1 mg/dL (ref 8.7–10.3)
Chloride: 96 mmol/L (ref 96–106)
Creatinine, Ser: 0.88 mg/dL (ref 0.57–1.00)
Globulin, Total: 2.4 g/dL (ref 1.5–4.5)
Glucose: 188 mg/dL — ABNORMAL HIGH (ref 70–99)
Potassium: 4.5 mmol/L (ref 3.5–5.2)
Sodium: 135 mmol/L (ref 134–144)
Total Protein: 6.5 g/dL (ref 6.0–8.5)
eGFR: 66 mL/min/{1.73_m2} (ref >59)

## 2023-04-24 ENCOUNTER — Other Ambulatory Visit: Payer: Self-pay | Admitting: *Deleted

## 2023-04-24 DIAGNOSIS — E785 Hyperlipidemia, unspecified: Secondary | ICD-10-CM

## 2023-05-16 ENCOUNTER — Ambulatory Visit (INDEPENDENT_AMBULATORY_CARE_PROVIDER_SITE_OTHER): Payer: Medicare HMO

## 2023-05-16 DIAGNOSIS — I442 Atrioventricular block, complete: Secondary | ICD-10-CM | POA: Diagnosis not present

## 2023-05-24 ENCOUNTER — Encounter: Payer: Self-pay | Admitting: Internal Medicine

## 2023-05-24 ENCOUNTER — Ambulatory Visit: Payer: Medicare PPO | Attending: Internal Medicine | Admitting: Internal Medicine

## 2023-05-24 VITALS — BP 110/60 | HR 83 | Ht 60.0 in | Wt 101.6 lb

## 2023-05-24 DIAGNOSIS — I495 Sick sinus syndrome: Secondary | ICD-10-CM | POA: Diagnosis not present

## 2023-05-24 NOTE — Patient Instructions (Signed)
Medication Instructions:  Your physician recommends that you continue on your current medications as directed. Please refer to the Current Medication list given to you today.  *If you need a refill on your cardiac medications before your next appointment, please call your pharmacy*   Lab Work: None ordered   Testing/Procedures: None ordered   Follow-Up: At [facility], you and your health needs are our priority.  As part of our continuing mission to provide you with exceptional heart care, we have created designated Provider Care Teams.  These Care Teams include your primary Cardiologist (physician) and Advanced Practice Providers (APPs -  Physician Assistants and Nurse Practitioners) who all work together to provide you with the care you need, when you need it.  Remote monitoring is used to monitor your Pacemaker or ICD from home. This monitoring reduces the number of office visits required to check your device to one time per year. It allows [place] to keep an eye on the functioning of your device to ensure it is working properly. You are scheduled for a device check from home on 08/15/23. You may send your transmission at any time that day. If you have a wireless device, the transmission will be sent automatically. After your physician reviews your transmission, you will receive a postcard with your next transmission date.  Your next appointment:   1 year(s)  The format for your next appointment:   In Person  Provider:   Lewayne Bunting, MD{  Thank you for choosing [facility]!!   [phone]

## 2023-05-24 NOTE — Progress Notes (Signed)
HPI Danielle Clarke returns today for PPM followup. She is a pleasant 82 yo woman with a h/o AS, syncope and symptomatic bradycardia s/p PPM insertion. She had a serious fall due to sinus arrest.  Allergies  Allergen Reactions   Cefaclor Anaphylaxis    Tolerated cephalexin in 2018   Tetracycline Anaphylaxis   Vancomycin Anaphylaxis   Alendronate Sodium Other (See Comments)   Augmentin [Amoxicillin-Pot Clavulanate] Other (See Comments)    Reaction unknown >> Anaphylaxis Has patient had a PCN reaction causing immediate rash, facial/tongue/throat swelling, SOB or lightheadedness with hypotension: Unknown Has patient had a PCN reaction causing severe rash involving mucus membranes or skin necrosis: Unknown Has patient had a PCN reaction that required hospitalization: Unknown Has patient had a PCN reaction occurring within the last 10 years: Unknown If all of the above answers are "NO", then may proceed with Cephalosporin use.    Nsaids     CKD   Quinapril Hcl     elevated potassium   Sulfamethoxazole-Trimethoprim     decreased kidney function   Prednisone Other (See Comments)    Reaction unknown     Current Outpatient Medications  Medication Sig Dispense Refill   aspirin EC 81 MG tablet Take 1 tablet (81 mg total) by mouth daily. Swallow whole. 30 tablet 12   azithromycin (ZITHROMAX) 500 MG tablet Take 1 tablet (500 mg total) by mouth as needed. Take one tablet by mouth one hour prior to dental cleanings. 3 tablet 3   Cholecalciferol (VITAMIN D) 50 MCG (2000 UT) tablet Take 2,000 Units by mouth daily.     Cyanocobalamin (B-12) 5000 MCG CAPS Take 5,000 mcg by mouth daily.     ferrous sulfate 325 (65 FE) MG tablet Take 325 mg by mouth daily with breakfast.     HUMALOG KWIKPEN 100 UNIT/ML KwikPen Inject 0-10 Units into the skin 3 (three) times daily as needed (high blood sugar). Sliding Scale     Insulin Glargine (LANTUS) 100 UNIT/ML Solostar Pen Inject 13 Units into the skin daily.  (Patient taking differently: Inject 10-12 Units into the skin in the morning.) 15 mL 11   Menthol-Methyl Salicylate (SALONPAS PAIN RELIEF PATCH) PTCH Apply 1 patch topically daily as needed (patch).     montelukast (SINGULAIR) 10 MG tablet Take 10 mg by mouth at bedtime.     Polyethyl Glycol-Propyl Glycol (SYSTANE OP) Place 1 drop into both eyes daily as needed (dry eyes).     protein supplement shake (PREMIER PROTEIN) LIQD Take 11 oz by mouth 2 (two) times daily. 30 mg of protein     simvastatin (ZOCOR) 40 MG tablet Take 40 mg by mouth at bedtime.     No current facility-administered medications for this visit.     Past Medical History:  Diagnosis Date   Aortic stenosis, mild    Arthritis    Asthmatic bronchitis    with colds per patient   Carotid artery disease (HCC)    40-59% bilateral ICA stenosis   Cataract    Cutaneous abscess of right foot    Glaucoma    Heart murmur    Dr Katrinka Blazing is her  cardiologist.    Hyperlipemia    Hypertension    Dr. 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 Lynnette Caffey and Dr. Leafy Ro  Syncope    Type 1 diabetes mellitus (HCC)     ROS:   All systems reviewed and negative except as noted in the HPI.   Past Surgical History:  Procedure Laterality Date   ANKLE FRACTURE SURGERY Left 2001   steel plate and 3 screws    ANTERIOR CERVICAL CORPECTOMY N/A 07/31/2014   Procedure: Cervical Four to Cervical Six Corpectomy;  Surgeon: Karn Cassis, MD;  Location: [facility];  Service: Neurosurgery;  Laterality: N/A;  C4 to C6 Corpectomy   BREAST SURGERY  1988   CATARACT EXTRACTION W/ INTRAOCULAR LENS  IMPLANT, BILATERAL Bilateral 2011   right and then left   EYE SURGERY     FRACTURE SURGERY     HAMMER TOE SURGERY  1998   I & D EXTREMITY Right 09/27/2018   Procedure: IRRIGATION AND DEBRIDEMENT RIGHT FOOT;  Surgeon: Nadara Mustard, MD;  Location: [facility] OR;  Service: Orthopedics;  Laterality: Right;   INTRAOPERATIVE TRANSTHORACIC ECHOCARDIOGRAM N/A 02/06/2023   Procedure: INTRAOPERATIVE TRANSTHORACIC ECHOCARDIOGRAM;  Surgeon: Orbie Pyo, MD;  Location[facility] OR;  Service: Open Heart Surgery;  Laterality: N/A;   JOINT REPLACEMENT     PACEMAKER IMPLANT N/A 02/10/2019   Procedure: PACEMAKER IMPLANT;  Surgeon: Marinus Maw, MD;  [facility] LAB;  Service: Cardiovascular;  Laterality: N/A;   RIGHT/LEFT HEART CATH AND CORONARY ANGIOGRAPHY N/A 01/11/2023   Procedure: RIGHT/LEFT HEART CATH AND CORONARY ANGIOGRAPHY;  Surgeon: Orbie Pyo, MD;  [facility]: [facility] LAB;  Service: Cardiovascular;  Laterality: N/A;   TONSILLECTOMY AND ADENOIDECTOMY  1948   TOTAL SHOULDER REPLACEMENT  2010   right shoulder    TRANSCATHETER AORTIC VALVE REPLACEMENT, TRANSFEMORAL N/A 02/06/2023   Procedure: Transcatheter Aortic Valve Replacement, Transfemoral;  Surgeon: Orbie Pyo, MD;  Location: [facility] OR;  Service: Open Heart Surgery;  Laterality: N/A;   TUBAL LIGATION  1979   TYMPANOSTOMY TUBE PLACEMENT Bilateral      Family History  Problem Relation Age of Onset   Cancer Mother    Heart Problems Father      Social History   Socioeconomic History   Marital status: Divorced    Spouse name: Not on file   Number of children: 2   Years of education: [employer]   Highest education level: Not on file  Occupational History   Occupation: Retired    Associate Professor: RETIRED  Tobacco Use   Smoking status: Never   Smokeless tobacco: Never  Vaping Use   Vaping Use: Never used  Substance and Sexual Activity   Alcohol use: Yes    Alcohol/week: 2.0 standard drinks of alcohol    Types: 2 Glasses of wine per week   Drug use: Never   Sexual activity: Never  Other Topics Concern   Not on file  Social History Narrative   Patient lives at home alone.   Caffeine Use:    Social Determinants of Research scientist (physical sciences) Strain: Not on file  Food Insecurity: Not on file  Transportation Needs: Not on file  Physical Activity: Not on file  Stress: Not on file  Social Connections: Not on file  Intimate Partner Violence: Not on file     BP 110/60   Pulse 83   Ht 5' (1.524 m)   Wt 101 lb 9.6 oz (46.1 kg)   SpO2 96%   BMI 19.84 kg/m   Physical Exam:  Well appearing NAD HEENT: Unremarkable Neck:  No JVD, no  thyromegally Lymphatics:  No adenopathy Back:  No CVA tenderness Lungs:  Clear with no wheezes HEART:  Regular rate rhythm, no murmurs, no rubs, no clicks Abd:  soft, positive bowel sounds, no organomegally, no rebound, no guarding Ext:  2 plus pulses, no edema, no cyanosis, no clubbing Skin:  No rashes no nodules Neuro:  CN II through XII intact, motor grossly intact   DEVICE  Normal device function.  See PaceArt for details.   Assess/Plan:  1.Sinus node dysfunction  - she is asymptomatic, s/p PPM insertion.  2. PPM - her Biotronik DDD PM is working normally and has about 9 years of battery longevity. 3. HTN - her bp is controlled.    Leonia Reeves.D.

## 2023-05-28 LAB — CUP PACEART REMOTE DEVICE CHECK
Battery Voltage: 70
Date Time Interrogation Session: 20240628123539
Implantable Lead Connection Status: 753985
Implantable Lead Connection Status: 753985
Implantable Lead Implant Date: 20200316
Implantable Lead Implant Date: 20200316
Implantable Lead Location: 753859
Implantable Lead Location: 753860
Implantable Lead Model: 377
Implantable Lead Model: 377
Implantable Lead Serial Number: 81099636
Implantable Lead Serial Number: 81165051
Implantable Pulse Generator Implant Date: 20200316
Pulse Gen Model: 407145
Pulse Gen Serial Number: 69557134

## 2023-06-22 ENCOUNTER — Ambulatory Visit: Payer: Medicare PPO | Attending: Nurse Practitioner

## 2023-06-22 DIAGNOSIS — E785 Hyperlipidemia, unspecified: Secondary | ICD-10-CM | POA: Diagnosis not present

## 2023-06-22 LAB — LIPID PANEL: Triglycerides: 73 mg/dL (ref 0–149)

## 2023-06-29 DIAGNOSIS — Z0000 Encounter for general adult medical examination without abnormal findings: Secondary | ICD-10-CM | POA: Diagnosis not present

## 2023-06-29 DIAGNOSIS — N1831 Chronic kidney disease, stage 3a: Secondary | ICD-10-CM | POA: Diagnosis not present

## 2023-06-29 DIAGNOSIS — I272 Pulmonary hypertension, unspecified: Secondary | ICD-10-CM | POA: Diagnosis not present

## 2023-06-29 DIAGNOSIS — I10 Essential (primary) hypertension: Secondary | ICD-10-CM | POA: Diagnosis not present

## 2023-06-29 DIAGNOSIS — I700 Atherosclerosis of aorta: Secondary | ICD-10-CM | POA: Diagnosis not present

## 2023-06-29 DIAGNOSIS — E1022 Type 1 diabetes mellitus with diabetic chronic kidney disease: Secondary | ICD-10-CM | POA: Diagnosis not present

## 2023-06-29 DIAGNOSIS — I350 Nonrheumatic aortic (valve) stenosis: Secondary | ICD-10-CM | POA: Diagnosis not present

## 2023-06-29 DIAGNOSIS — E7800 Pure hypercholesterolemia, unspecified: Secondary | ICD-10-CM | POA: Diagnosis not present

## 2023-06-29 DIAGNOSIS — I442 Atrioventricular block, complete: Secondary | ICD-10-CM | POA: Diagnosis not present

## 2023-08-21 NOTE — Progress Notes (Signed)
Remote pacemaker transmission.   

## 2023-08-24 ENCOUNTER — Ambulatory Visit (INDEPENDENT_AMBULATORY_CARE_PROVIDER_SITE_OTHER): Payer: Medicare PPO

## 2023-08-24 DIAGNOSIS — I495 Sick sinus syndrome: Secondary | ICD-10-CM

## 2023-08-24 LAB — CUP PACEART REMOTE DEVICE CHECK
Date Time Interrogation Session: 20240927091129
Implantable Lead Connection Status: 753985
Implantable Lead Connection Status: 753985
Implantable Lead Implant Date: 20200316
Implantable Lead Implant Date: 20200316
Implantable Lead Location: 753859
Implantable Lead Location: 753860
Implantable Lead Model: 377
Implantable Lead Model: 377
Implantable Lead Serial Number: 81099636
Implantable Lead Serial Number: 81165051
Implantable Pulse Generator Implant Date: 20200316
Pulse Gen Model: 407145
Pulse Gen Serial Number: 69557134

## 2023-08-30 NOTE — Progress Notes (Signed)
Remote pacemaker transmission.   

## 2023-09-19 DIAGNOSIS — I10 Essential (primary) hypertension: Secondary | ICD-10-CM | POA: Diagnosis not present

## 2023-09-19 DIAGNOSIS — E162 Hypoglycemia, unspecified: Secondary | ICD-10-CM | POA: Diagnosis not present

## 2023-09-19 DIAGNOSIS — E1065 Type 1 diabetes mellitus with hyperglycemia: Secondary | ICD-10-CM | POA: Diagnosis not present

## 2023-09-19 DIAGNOSIS — I639 Cerebral infarction, unspecified: Secondary | ICD-10-CM | POA: Diagnosis not present

## 2023-09-19 DIAGNOSIS — E7800 Pure hypercholesterolemia, unspecified: Secondary | ICD-10-CM | POA: Diagnosis not present

## 2023-09-19 DIAGNOSIS — M810 Age-related osteoporosis without current pathological fracture: Secondary | ICD-10-CM | POA: Diagnosis not present

## 2023-10-14 NOTE — Progress Notes (Unsigned)
Cardiology Office Note:   Date:  10/15/2023  ID:  Danielle Clarke, DOB 05/02/1941, MRN [id number] PCP:  Ollen Bowl, MD  [facility] Providers Cardiologist:  Alverda Skeans, MD Referring MD: Ollen Bowl, MD  Chief Complaint/Reason for Referral: Cardiology follow-up ASSESSMENT:    1. S/P TAVR (transcatheter aortic valve replacement)   2. 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 Hyperlipidemia associated with type 2 diabetes mellitus (HCC)   4. Cardiac pacemaker in situ   5. Aortic atherosclerosis (HCC)   6. CKD (chronic kidney disease) stage 2, GFR 60-89 ml/min   7. Bilateral carotid artery stenosis   8. Hypertension associated with diabetes (HCC)     PLAN:   In order of problems listed above: Status post TAVR: Last echocardiogram showed very good valve function with preserved LV function.  Continue aspirin monotherapy.  Informed patient and she will now be taking aspirin 81 mg once rather than twice a day. Type 1 diabetes mellitus: Continue aspirin and statin; not a candidate for SGLT2i Hyperlipidemia: Check lipid panel, LFTs, LP(a) next week. Cardiac pacemaker: Seen by EP recently and pacemaker was working without issues. Aortic atherosclerosis: Continue aspirin, statin, strict blood pressure control. CKD stage II: Will start losartan 12.5 mg daily; not a candidate for SGLT2i. Carotid disease: Aspirin, statin, and blood pressure control. Hypertension: Start losartan 12.5 mg daily and check BMP in 1 week.           Dispo:  Return in about 6 months (around 04/13/2024).      Medication Adjustments/Labs and Tests Ordered: Current medicines are reviewed at length with the patient today.  Concerns regarding medicines are outlined above.  The following changes have been made:     Labs/tests ordered: Orders Placed This Encounter  Procedures   Comprehensive metabolic panel   Lipid panel   Lipoprotein A (LPA)    Medication  Changes: Meds ordered this encounter  Medications   losartan (COZAAR) 25 MG tablet    Sig: Take 0.5 tablets (12.5 mg total) by mouth at bedtime.    Dispense:  45 tablet    Refill:  3    Current medicines are reviewed at length with the patient today.  The patient does not have concerns regarding medicines.  I spent 31 minutes reviewing all clinical data during and prior to this visit including all relevant imaging studies, laboratories, clinical information from other health systems, and prior notes from both Cardiology and other specialties, interviewing the patient, and conducting a complete physical examination in order to formulate a comprehensive and personalized evaluation and treatment plan.  History of Present Illness:      FOCUSED PROBLEM LIST:   Aortic stenosis 20mm S3 TAVR March 2024 Symptomatic bradycardia  s/p pacemaker 2020 fall complicated by ICH Carotid disease Less than 40% disease bilaterally 2021 T1DM on insulin Not a candidate for SGLT2 inhibitor Hyperlipidemia Frailty Coronary artery disease Minimal cath 2024 Aortic atherosclerosis CT abdomen pelvis 2024 CKD stage II  HISTORY OF PRESENT ILLNESS: January 2024: The patient is a 82 y.o. female with the indicated medical history here for recommendations regarding her aortic stenosis.  She had been followed by Dr. Katrinka Blazing for several years and her most recent echocardiogram demonstrated severe aortic stenosis.  She was noted to have developed some exertional fatigue at times when seen several months ago.  The patient is here with her daughter.  She tells me that over the last few years she has  not been able to do as much as before.  However she feels like this is age-related.  She does not really experience any shortness of breath, presyncope, syncope, or chest pain.  She denies any orthopnea or paroxysmal nocturnal dyspnea.  She says she is able to do most of her activities of daily living but she is unsure whether  she is slowing down or not.  Her daughter thinks she may be but has not been keeping track of this to closely.  When she was younger she was quite active and seem to be quite athletic as well.  She does not smoke.  She lives by herself but has family close by.  She recently had dental work to treat a dental infection and has a clear dental report per the patient.  Plan: Continue to monitor for symptoms.  November 2024: Shortly after I saw the patient she did report increasing fatigue.  She was referred for evaluation for transcatheter valve replacement.  Cardiac catheterization demonstrated minimal coronary artery disease.  She had a 20 mm SAPIEN 3 valve placed in March 2024.  Her last echocardiogram in April demonstrated normal LV function with a mean gradient of 13 mmHg associate with the valve with mild paravalvular leak.  She was seen in follow-up and was doing well.  She saw Dr. Ladona Ridgel of EP and her pacemaker was functioning without issues.  She is here today and without significant cardiovascular complaints.  Her biggest issue seems to be some trouble swallowing.  She tells me that her blood sugars are not well-controlled and sometimes they bottom out.  She denies any shortness of breath or exertional angina.  She has had no presyncope or syncope.  She has had no signs or symptoms of stroke.  She is tolerating aspirin in fact she is taking aspirin twice a day rather than once a day.  She has had no severe bleeding or bruising.  She feels much better after her TAVR procedure.          Current Medications: Current Meds  Medication Sig   aspirin EC 81 MG tablet Take 1 tablet (81 mg total) by mouth daily. Swallow whole.   azithromycin (ZITHROMAX) 500 MG tablet Take 1 tablet (500 mg total) by mouth as needed. Take one tablet by mouth one hour prior to dental cleanings.   Cholecalciferol (VITAMIN D) 50 MCG (2000 UT) tablet Take 2,000 Units by mouth daily.   Continuous Glucose Receiver (DEXCOM G7  RECEIVER) DEVI See admin instructions.   Continuous Glucose Sensor (DEXCOM G7 SENSOR) MISC    Cyanocobalamin (B-12) 5000 MCG CAPS Take 5,000 mcg by mouth daily.   ferrous sulfate 325 (65 FE) MG tablet Take 325 mg by mouth daily with breakfast.   HUMALOG KWIKPEN 100 UNIT/ML KwikPen Inject 0-10 Units into the skin 3 (three) times daily as needed (high blood sugar). Sliding Scale   Insulin Glargine (LANTUS) 100 UNIT/ML Solostar Pen Inject 13 Units into the skin daily. (Patient taking differently: Inject 10-12 Units into the skin in the morning.)   losartan (COZAAR) 25 MG tablet Take 0.5 tablets (12.5 mg total) by mouth at bedtime.   Menthol-Methyl Salicylate (SALONPAS PAIN RELIEF PATCH) PTCH Apply 1 patch topically daily as needed (patch).   montelukast (SINGULAIR) 10 MG tablet Take 10 mg by mouth at bedtime.   Polyethyl Glycol-Propyl Glycol (SYSTANE OP) Place 1 drop into both eyes daily as needed (dry eyes).   protein supplement shake (PREMIER PROTEIN) LIQD Take 11 oz by  mouth 2 (two) times daily. 30 mg of protein   simvastatin (ZOCOR) 40 MG tablet Take 40 mg by mouth at bedtime.     Review of Systems:   Please see the history of present illness.    All other systems reviewed and are negative.     EKGs/Labs/Other Test Reviewed:   EKG: EKG from March 2024 demonstrates sinus rhythm with left ventricular hypertrophy  EKG Interpretation Date/Time:    Ventricular Rate:    PR Interval:    QRS Duration:    QT Interval:    QTC Calculation:   R Axis:      Text Interpretation:           Risk Assessment/Calculations:          Physical Exam:   VS:  BP (!) 140/90   Pulse 84   Ht 5' (1.524 m)   Wt 102 lb 12.8 oz (46.6 kg)   SpO2 95%   BMI 20.08 kg/m    HYPERTENSION CONTROL Vitals:   10/15/23 1414 10/15/23 1440  BP: (!) 146/54 (!) 140/90    The patient's blood pressure is elevated above target today.  In order to address the patient's elevated BP: A new medication was  prescribed today.      Wt Readings from Last 3 Encounters:  10/15/23 102 lb 12.8 oz (46.6 kg)  05/24/23 101 lb 9.6 oz (46.1 kg)  04/19/23 99 lb (44.9 kg)      GENERAL:  No apparent distress, AOx3 HEENT:  No carotid bruits, +2 carotid impulses, no scleral icterus CAR: RRR 2/6 SEM no gallops, rubs, or thrills RES:  Clear to auscultation bilaterally ABD:  Soft, nontender, nondistended, positive bowel sounds x 4 VASC:  Absent R radial pulse, +2 left radial pulse, +2 carotid pulses NEURO:  CN 2-12 grossly intact; motor and sensory grossly intact PSYCH:  No active depression or anxiety EXT:  No edema, ecchymosis, or cyanosis  Signed, Orbie Pyo, MD  10/15/2023 2:54 PM    [facility] [address] [place], [place], [place]  [address] Phone: [phone]; Fax: [phone]   Note:  This document was prepared using Dragon voice recognition software and may include unintentional dictation errors.

## 2023-10-15 ENCOUNTER — Ambulatory Visit: Payer: Medicare PPO | Attending: Internal Medicine | Admitting: Internal Medicine

## 2023-10-15 ENCOUNTER — Encounter: Payer: Self-pay | Admitting: Internal Medicine

## 2023-10-15 VITALS — BP 140/90 | HR 84 | Ht 60.0 in | Wt 102.8 lb

## 2023-10-15 DIAGNOSIS — I6523 Occlusion and stenosis of bilateral carotid arteries: Secondary | ICD-10-CM | POA: Diagnosis not present

## 2023-10-15 DIAGNOSIS — N182 Chronic kidney disease, stage 2 (mild): Secondary | ICD-10-CM | POA: Diagnosis not present

## 2023-10-15 DIAGNOSIS — E1022 Type 1 diabetes mellitus with diabetic chronic kidney disease: Secondary | ICD-10-CM

## 2023-10-15 DIAGNOSIS — I700 Atherosclerosis of aorta: Secondary | ICD-10-CM | POA: Diagnosis not present

## 2023-10-15 DIAGNOSIS — E1159 Type 2 diabetes mellitus with other circulatory complications: Secondary | ICD-10-CM

## 2023-10-15 DIAGNOSIS — E785 Hyperlipidemia, unspecified: Secondary | ICD-10-CM | POA: Diagnosis not present

## 2023-10-15 DIAGNOSIS — Z952 Presence of prosthetic heart valve: Secondary | ICD-10-CM | POA: Diagnosis not present

## 2023-10-15 DIAGNOSIS — I152 Hypertension secondary to endocrine disorders: Secondary | ICD-10-CM | POA: Diagnosis not present

## 2023-10-15 DIAGNOSIS — Z950 Presence of cardiac pacemaker: Secondary | ICD-10-CM

## 2023-10-15 DIAGNOSIS — E1169 Type 2 diabetes mellitus with other specified complication: Secondary | ICD-10-CM

## 2023-10-15 MED ORDER — LOSARTAN POTASSIUM 25 MG PO TABS: 12.5000 mg | ORAL_TABLET | Freq: Every day | ORAL | 3 refills | Status: DC

## 2023-10-15 NOTE — Patient Instructions (Addendum)
Medication Instructions:  Your physician has recommended you make the following change in your medication:  1.) start losartan 12.5 mg daily at bedtime 2.) take aspirin 81 ONLY ONCE A DAY  *If you need a refill on your cardiac medications before your next appointment, please call your pharmacy*   Lab Work: Please return for lipids, lpa and cmet in about 7-10 days  If you have labs (blood work) drawn today and your tests are completely normal, you will receive your results only by: MyChart Message (if you have MyChart) OR A paper copy in the mail If you have any lab test that is abnormal or we need to change your treatment, we will call you to review the results.   Testing/Procedures: none   Follow-Up: At [facility], you and your health needs are our priority.  As part of our continuing mission to provide you with exceptional heart care, we have created designated Provider Care Teams.  These Care Teams include your primary Cardiologist (physician) and Advanced Practice Providers (APPs -  Physician Assistants and Nurse Practitioners) who all work together to provide you with the care you need, when you need it.     Your next appointment:   6 month(s)  Provider:   Orbie Pyo, MD

## 2023-10-23 DIAGNOSIS — E1169 Type 2 diabetes mellitus with other specified complication: Secondary | ICD-10-CM | POA: Diagnosis not present

## 2023-10-23 DIAGNOSIS — E785 Hyperlipidemia, unspecified: Secondary | ICD-10-CM | POA: Diagnosis not present

## 2023-10-24 LAB — COMPREHENSIVE METABOLIC PANEL
ALT: 9 [IU]/L (ref 0–32)
AST: 14 [IU]/L (ref 0–40)
Albumin: 4 g/dL (ref 3.7–4.7)
Alkaline Phosphatase: 128 [IU]/L — ABNORMAL HIGH (ref 44–121)
BUN/Creatinine Ratio: 10 — ABNORMAL LOW (ref 12–28)
BUN: 9 mg/dL (ref 8–27)
Bilirubin Total: 0.3 mg/dL (ref 0.0–1.2)
CO2: 28 mmol/L (ref 20–29)
Calcium: 9.2 mg/dL (ref 8.7–10.3)
Chloride: 99 mmol/L (ref 96–106)
Creatinine, Ser: 0.88 mg/dL (ref 0.57–1.00)
Globulin, Total: 2.9 g/dL (ref 1.5–4.5)
Glucose: 59 mg/dL — ABNORMAL LOW (ref 70–99)
Potassium: 4.3 mmol/L (ref 3.5–5.2)
Sodium: 140 mmol/L (ref 134–144)
Total Protein: 6.9 g/dL (ref 6.0–8.5)
eGFR: 66 mL/min/{1.73_m2} (ref >59)

## 2023-10-24 LAB — LIPID PANEL
Chol/HDL Ratio: 2.3 {ratio} (ref 0.0–4.4)
Cholesterol, Total: 153 mg/dL (ref 100–199)
HDL: 67 mg/dL (ref >39)
LDL Chol Calc (NIH): 71 mg/dL (ref 0–99)
Triglycerides: 79 mg/dL (ref 0–149)
VLDL Cholesterol Cal: 15 mg/dL (ref 5–40)

## 2023-10-24 LAB — LIPOPROTEIN A (LPA): Lipoprotein (a): 171.2 nmol/L — ABNORMAL HIGH (ref <75.0)

## 2023-11-23 ENCOUNTER — Ambulatory Visit (INDEPENDENT_AMBULATORY_CARE_PROVIDER_SITE_OTHER): Payer: Medicare PPO

## 2023-11-23 DIAGNOSIS — I495 Sick sinus syndrome: Secondary | ICD-10-CM

## 2023-11-26 LAB — CUP PACEART REMOTE DEVICE CHECK
Date Time Interrogation Session: 20241227065525
Implantable Lead Connection Status: 753985
Implantable Lead Connection Status: 753985
Implantable Lead Implant Date: 20200316
Implantable Lead Implant Date: 20200316
Implantable Lead Location: 753859
Implantable Lead Location: 753860
Implantable Lead Model: 377
Implantable Lead Model: 377
Implantable Lead Serial Number: 81099636
Implantable Lead Serial Number: 81165051
Implantable Pulse Generator Implant Date: 20200316
Pulse Gen Model: 407145
Pulse Gen Serial Number: 69557134

## 2023-12-04 DIAGNOSIS — R059 Cough, unspecified: Secondary | ICD-10-CM | POA: Diagnosis not present

## 2023-12-12 ENCOUNTER — Telehealth: Payer: Self-pay | Admitting: Internal Medicine

## 2023-12-12 NOTE — Telephone Encounter (Signed)
 Paper Work Dropped Off: Dept. Of Transportation  Date:12/12/2023  Location of paper:  Dr Lorie Rook mail box

## 2023-12-12 NOTE — Telephone Encounter (Signed)
*  Patient needs to py $29 cash, money order or check to pick up transportation paperwork.

## 2023-12-18 DIAGNOSIS — H40033 Anatomical narrow angle, bilateral: Secondary | ICD-10-CM | POA: Diagnosis not present

## 2023-12-18 DIAGNOSIS — H43393 Other vitreous opacities, bilateral: Secondary | ICD-10-CM | POA: Diagnosis not present

## 2023-12-18 NOTE — Telephone Encounter (Signed)
Patient came in today inquiring about paperwork. She had not received a call about paperwork, I informed her it could take 7-14 business days to completed. She said ok and left. If there are any problems filling out please call @   [phone].

## 2023-12-19 NOTE — Telephone Encounter (Signed)
Cardiology section of [place] [employer] paperwork completed by Dr. Lynnette Caffey this afternoon.  Left message for patient to call back.  Forms placed at front desk designated area for completion.

## 2023-12-20 ENCOUNTER — Telehealth: Payer: Self-pay | Admitting: Internal Medicine

## 2023-12-20 NOTE — Telephone Encounter (Signed)
Patient came in to pick up DMV paperwork.

## 2023-12-27 NOTE — Addendum Note (Signed)
Addended by: Elease Etienne A on: 12/27/2023 12:01 PM   Modules accepted: Orders

## 2023-12-27 NOTE — Progress Notes (Signed)
Remote pacemaker transmission.

## 2024-01-01 DIAGNOSIS — I272 Pulmonary hypertension, unspecified: Secondary | ICD-10-CM | POA: Diagnosis not present

## 2024-01-01 DIAGNOSIS — I2510 Atherosclerotic heart disease of native coronary artery without angina pectoris: Secondary | ICD-10-CM | POA: Diagnosis not present

## 2024-01-01 DIAGNOSIS — I442 Atrioventricular block, complete: Secondary | ICD-10-CM | POA: Diagnosis not present

## 2024-01-01 DIAGNOSIS — N1831 Chronic kidney disease, stage 3a: Secondary | ICD-10-CM | POA: Diagnosis not present

## 2024-01-01 DIAGNOSIS — E7800 Pure hypercholesterolemia, unspecified: Secondary | ICD-10-CM | POA: Diagnosis not present

## 2024-01-01 DIAGNOSIS — M810 Age-related osteoporosis without current pathological fracture: Secondary | ICD-10-CM | POA: Diagnosis not present

## 2024-01-01 DIAGNOSIS — Z950 Presence of cardiac pacemaker: Secondary | ICD-10-CM | POA: Diagnosis not present

## 2024-01-01 DIAGNOSIS — E109 Type 1 diabetes mellitus without complications: Secondary | ICD-10-CM | POA: Diagnosis not present

## 2024-01-01 DIAGNOSIS — I10 Essential (primary) hypertension: Secondary | ICD-10-CM | POA: Diagnosis not present

## 2024-02-10 ENCOUNTER — Emergency Department (HOSPITAL_COMMUNITY)

## 2024-02-10 ENCOUNTER — Emergency Department (HOSPITAL_COMMUNITY)
Admission: EM | Admit: 2024-02-10 | Discharge: 2024-02-11 | Disposition: A | Discharge status: Home / Self Care | Attending: Emergency Medicine | Admitting: Emergency Medicine

## 2024-02-10 DIAGNOSIS — N183 Chronic kidney disease, stage 3 unspecified: Secondary | ICD-10-CM | POA: Diagnosis not present

## 2024-02-10 DIAGNOSIS — I6523 Occlusion and stenosis of bilateral carotid arteries: Secondary | ICD-10-CM | POA: Diagnosis not present

## 2024-02-10 DIAGNOSIS — R531 Weakness: Secondary | ICD-10-CM | POA: Diagnosis not present

## 2024-02-10 DIAGNOSIS — R404 Transient alteration of awareness: Secondary | ICD-10-CM | POA: Diagnosis not present

## 2024-02-10 DIAGNOSIS — Z79899 Other long term (current) drug therapy: Secondary | ICD-10-CM | POA: Insufficient documentation

## 2024-02-10 DIAGNOSIS — E11649 Type 2 diabetes mellitus with hypoglycemia without coma: Secondary | ICD-10-CM | POA: Diagnosis not present

## 2024-02-10 DIAGNOSIS — Z7982 Long term (current) use of aspirin: Secondary | ICD-10-CM | POA: Diagnosis not present

## 2024-02-10 DIAGNOSIS — R29818 Other symptoms and signs involving the nervous system: Secondary | ICD-10-CM | POA: Diagnosis not present

## 2024-02-10 DIAGNOSIS — E10649 Type 1 diabetes mellitus with hypoglycemia without coma: Secondary | ICD-10-CM | POA: Insufficient documentation

## 2024-02-10 DIAGNOSIS — E1022 Type 1 diabetes mellitus with diabetic chronic kidney disease: Secondary | ICD-10-CM | POA: Insufficient documentation

## 2024-02-10 DIAGNOSIS — D72829 Elevated white blood cell count, unspecified: Secondary | ICD-10-CM | POA: Insufficient documentation

## 2024-02-10 DIAGNOSIS — I700 Atherosclerosis of aorta: Secondary | ICD-10-CM | POA: Diagnosis not present

## 2024-02-10 DIAGNOSIS — I10 Essential (primary) hypertension: Secondary | ICD-10-CM | POA: Diagnosis not present

## 2024-02-10 DIAGNOSIS — Z794 Long term (current) use of insulin: Secondary | ICD-10-CM | POA: Insufficient documentation

## 2024-02-10 DIAGNOSIS — G9389 Other specified disorders of brain: Secondary | ICD-10-CM | POA: Diagnosis not present

## 2024-02-10 DIAGNOSIS — E162 Hypoglycemia, unspecified: Secondary | ICD-10-CM | POA: Diagnosis not present

## 2024-02-10 DIAGNOSIS — R231 Pallor: Secondary | ICD-10-CM | POA: Diagnosis not present

## 2024-02-10 DIAGNOSIS — R29810 Facial weakness: Secondary | ICD-10-CM | POA: Insufficient documentation

## 2024-02-10 LAB — CBC
HCT: 36.7 % (ref 36.0–46.0)
Hemoglobin: 12.1 g/dL (ref 12.0–15.0)
MCH: 28.3 pg (ref 26.0–34.0)
MCHC: 33 g/dL (ref 30.0–36.0)
MCV: 85.7 fL (ref 80.0–100.0)
Platelets: 221 10*3/uL (ref 150–400)
RBC: 4.28 MIL/uL (ref 3.87–5.11)
RDW: 14.6 % (ref 11.5–15.5)
WBC: 12.4 10*3/uL — ABNORMAL HIGH (ref 4.0–10.5)
nRBC: 0 % (ref 0.0–0.2)

## 2024-02-10 LAB — DIFFERENTIAL
Abs Immature Granulocytes: 0.2 10*3/uL — ABNORMAL HIGH (ref 0.00–0.07)
Basophils Absolute: 0.1 10*3/uL (ref 0.0–0.1)
Basophils Relative: 1 %
Eosinophils Absolute: 0.1 10*3/uL (ref 0.0–0.5)
Eosinophils Relative: 1 %
Immature Granulocytes: 2 %
Lymphocytes Relative: 14 %
Lymphs Abs: 1.7 10*3/uL (ref 0.7–4.0)
Monocytes Absolute: 0.8 10*3/uL (ref 0.1–1.0)
Monocytes Relative: 7 %
Neutro Abs: 9.6 10*3/uL — ABNORMAL HIGH (ref 1.7–7.7)
Neutrophils Relative %: 75 %

## 2024-02-10 LAB — COMPREHENSIVE METABOLIC PANEL
ALT: 10 U/L (ref 0–44)
AST: 14 U/L — ABNORMAL LOW (ref 15–41)
Albumin: 3.5 g/dL (ref 3.5–5.0)
Alkaline Phosphatase: 88 U/L (ref 38–126)
Anion gap: 16 — ABNORMAL HIGH (ref 5–15)
BUN: 11 mg/dL (ref 8–23)
CO2: 26 mmol/L (ref 22–32)
Calcium: 9.1 mg/dL (ref 8.9–10.3)
Chloride: 98 mmol/L (ref 98–111)
Creatinine, Ser: 0.76 mg/dL (ref 0.44–1.00)
GFR, Estimated: >60 mL/min (ref >60)
Glucose, Bld: 141 mg/dL — ABNORMAL HIGH (ref 70–99)
Potassium: 3.7 mmol/L (ref 3.5–5.1)
Sodium: 140 mmol/L (ref 135–145)
Total Bilirubin: 0.5 mg/dL (ref 0.0–1.2)
Total Protein: 7 g/dL (ref 6.5–8.1)

## 2024-02-10 LAB — I-STAT CHEM 8, ED
BUN: 13 mg/dL (ref 8–23)
Calcium, Ion: 1.14 mmol/L — ABNORMAL LOW (ref 1.15–1.40)
Chloride: 99 mmol/L (ref 98–111)
Creatinine, Ser: 0.8 mg/dL (ref 0.44–1.00)
Glucose, Bld: 136 mg/dL — ABNORMAL HIGH (ref 70–99)
HCT: 38 % (ref 36.0–46.0)
Hemoglobin: 12.9 g/dL (ref 12.0–15.0)
Potassium: 3.7 mmol/L (ref 3.5–5.1)
Sodium: 136 mmol/L (ref 135–145)
TCO2: 30 mmol/L (ref 22–32)

## 2024-02-10 LAB — APTT: aPTT: 25 s (ref 24–36)

## 2024-02-10 LAB — PROTIME-INR
INR: 1 (ref 0.8–1.2)
Prothrombin Time: 13.1 s (ref 11.4–15.2)

## 2024-02-10 LAB — ETHANOL: Alcohol, Ethyl (B): <10 mg/dL (ref <10)

## 2024-02-10 MED ORDER — IOHEXOL 350 MG/ML SOLN
75.0000 mL | Freq: Once | INTRAVENOUS | Status: AC | PRN
  Administered 2024-02-10: 75 mL via INTRAVENOUS

## 2024-02-10 MED ORDER — SODIUM CHLORIDE 0.9% FLUSH: 3.0000 mL | Freq: Once | INTRAVENOUS | Status: DC

## 2024-02-10 NOTE — ED Notes (Signed)
 Pt connected ot cardaic monitor. Has personal cane, purse, and glasses at bedside. Call bell within reach. EDP Plunkett at bedside

## 2024-02-10 NOTE — ED Provider Notes (Incomplete)
 [place] [facility] DEPART[facility] AT [facility] Provider Note   CSN: [id number] Arrival date & time: 02/10/24  2227     History {Add pertinent medical, surgical, social history, OB history to HPI:1} No chief complaint on file.  Danielle Clarke is a 83 y.o. female with history of type 1 diabetes who presents to the emergency department as code stroke.  Met at the bridge by this provider and neurologist Dr. Derry Lory.  Airway patent.  Per EMS last known normal was 7 PM today, patient's cousin regularly checks on the patient who lives alone.  States that to this evening she called her and the patient did not answer, called EMS who presented and found the patient unconscious in a chair in her living room.  CBG was 43 at that time, unable to arouse the patient sufficiently to attempt oral glucose repletion therefore patient received 12.5 g of D10 via IV with improvement in her CBG and her mental status.  Per EMS some persistent circular reasoning and mild confusion but following commands and with resolution of initially noted left-sided facial droop.   Patient with history of type 1 diabetes since age of 77, patient contributes history that she symptoms a fall asleep in her chair and forget to eat dinner causing dips in her blood sugar.  Hx of recurrent syncope secondary to hypoglycemia in the past. Status post TAVR.  She is not anticoagulated. Patient with additional history of CKD stage III, glaucoma as well as of the syncope in the past.  Intracranial hemorrhage in 2020 after fall while she was at her dentist. HPI     Home Medications Prior to Admission medications   Medication Sig Start Date End Date Taking? Authorizing Provider  aspirin EC 81 MG tablet Take 1 tablet (81 mg total) by mouth daily. Swallow whole. 12/14/22   Orbie Pyo, MD  azithromycin (ZITHROMAX) 500 MG tablet Take 1 tablet (500 mg total) by mouth as needed. Take one tablet by mouth one hour prior to dental  cleanings. 02/15/23   [name] D, NP  Cholecalciferol (VITAMIN D) 50 MCG (2000 UT) tablet Take 2,000 Units by mouth daily.    [provider]  Continuous Glucose Receiver (DEXCOM G7 RECEIVER) DEVI See admin instructions. 09/19/23   [provider]  Continuous Glucose Sensor (DEXCOM G7 SENSOR) MISC  10/14/23   [provider]  Cyanocobalamin (B-12) 5000 MCG CAPS Take 5,000 mcg by mouth daily.    [provider]  ferrous sulfate 325 (65 FE) MG tablet Take 325 mg by mouth daily with breakfast.    [provider]  HUMALOG KWIKPEN 100 UNIT/ML KwikPen Inject 0-10 Units into the skin 3 (three) times daily as needed (high blood sugar). Sliding Scale 04/08/19   [provider]  Insulin Glargine (LANTUS) 100 UNIT/ML Solostar Pen Inject 13 Units into the skin daily. Patient taking differently: Inject 10-12 Units into the skin in the morning. 02/28/19   Angiulli, Mcarthur Rossetti, PA-C  losartan (COZAAR) 25 MG tablet Take 0.5 tablets (12.5 mg total) by mouth at bedtime. 10/15/23   Orbie Pyo, MD  Menthol-Methyl Salicylate (SALONPAS PAIN RELIEF PATCH) PTCH Apply 1 patch topically daily as needed (patch).    [provider]  montelukast (SINGULAIR) 10 MG tablet Take 10 mg by mouth at bedtime. 06/27/22   [provider]  Polyethyl Glycol-Propyl Glycol (SYSTANE OP) Place 1 drop into both eyes daily as needed (dry eyes).    [provider]  protein supplement shake (PREMIER PROTEIN) LIQD Take 11 oz by mouth 2 (two) times daily. 30 mg of protein    [provider]  simvastatin (ZOCOR) 40 MG tablet Take 40 mg by mouth at bedtime. 03/20/20   [provider]      Allergies    Cefaclor, Tetracycline, Vancomycin, Alendronate sodium, Augmentin [amoxicillin-pot clavulanate], Nsaids, Quinapril hcl, Sulfamethoxazole-trimethoprim, and Prednisone    Review of Systems   Review of Systems  Unable to perform ROS: Acuity of  condition  Neurological:  Positive for syncope and weakness.    Physical Exam Updated Vital Signs There were no vitals taken for this visit. Physical Exam  ED Results / Procedures / Treatments   Labs (all labs ordered are listed, but only abnormal results are displayed) Labs Reviewed  CBC - Abnormal; Notable for the following components:      Result Value   WBC 12.4 (*)    All other components within normal limits  DIFFERENTIAL - Abnormal; Notable for the following components:   Neutro Abs 9.6 (*)    Abs Immature Granulocytes 0.20 (*)    All other components within normal limits  I-STAT CHEM 8, ED - Abnormal; Notable for the following components:   Glucose, Bld 136 (*)    Calcium, Ion 1.14 (*)    All other components within normal limits  PROTIME-INR  APTT  COMPREHENSIVE METABOLIC PANEL  ETHANOL  CBG MONITORING, ED    EKG None  Radiology No results found.  Procedures .Critical Care  Performed by: Paris Lore, PA-C Authorized by: Paris Lore, PA-C   Critical care provider statement:    Critical care time (minutes):  45   Critical care was time spent personally by me on the following activities:  Development of treatment plan with patient or surrogate, discussions with consultants, evaluation of patient's response to treatment, examination of patient, obtaining history from patient or surrogate, ordering and performing treatments and interventions, ordering and review of laboratory studies, ordering and review of radiographic studies, pulse oximetry and re-evaluation of patient's condition   {Document cardiac monitor, telemetry assessment procedure when appropriate:1}  Medications Ordered in ED Medications  sodium chloride flush (NS) 0.9 % injection 3 mL (has no administration in time range)  iohexol (OMNIPAQUE) 350 MG/ML injection 75 mL (75 mLs Intravenous Contrast Given 02/10/24 2247)    ED Course/ Medical Decision Making/ A&P Clinical Course  as of 02/10/24 2250  Sun Feb 10, 2024  2248 Dr. Derry Lory, neurologist, canceled Code Stroke; no further neurologic workup indicated per neuro. I appreciate his collaboration in the care of this patient.  [RS]    Clinical Course User Index [RS] Ariela Mochizuki, Eugene Gavia, PA-C   {   Click here for ABCD2, HEART and other calculatorsREFRESH Note before signing :1}                              Medical Decision Making 83 year old female presented as a code stroke with hypoglycemia in the field.  Hypertensive on intake and vitals otherwise normal.  Cardiopulmonary and abdominal exams are benign.  Patient with GCS of 15 at this time.  Nonfocal neurologic exam though globally weak.***  The differential for syncope is extensive and includes, but is not limited to: arrythmia (Vtach, SVT, SSS, sinus arrest, AV block, bradycardia), aortic stenosis, AMI, hypertrophic obstructive cardiomyopathy (HOCM), PE, atrial myxoma, pulmonary hypertension, orthostatic hypotension, hypovolemia, drug effect, GB syndrome, micturition, cough, carotid sinus sensitivity,  seizure, TIA/CVA, hypoglycemia, vertigo.   Amount and/or Complexity of Data Reviewed Labs: ordered.   ***  {Document critical care time when appropriate:1} {Document review of labs and clinical decision tools ie heart score, Chads2Vasc2 etc:1}  {Document your independent review of radiology images, and any outside records:1} {Document your discussion with family members, caretakers, and with consultants:1} {Document social determinants of health affecting pt's care:1} {Document your decision making why or why not admission, treatments were needed:1} Final Clinical Impression(s) / ED Diagnoses Final diagnoses:  None    Rx / DC Orders ED Discharge Orders     None

## 2024-02-10 NOTE — ED Notes (Signed)
 Pt admitted to [facility]

## 2024-02-10 NOTE — Consult Note (Signed)
 NEUROLOGY CONSULT NOTE   Date of service: February 10, 2024 Patient Name: Danielle Clarke MRN:  [id number] DOB:  04/05/1941 Chief Complaint: "confusion, unequal pupils" Requesting Provider: No att. providers found  History of Present Illness  YTZEL GUBLER is a 83 y.o. 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  Her cousin called her but she did not answer the phone and requested a check on her due to her hx of type 1 diabetes with prior hx of hypoglycemia. EMS found her on the floor and she was unconscious. She was hypoglycemic to 43. She was noted to have a left facial droop and R pupil 5mm and non reactive, left pupil 3mm and unreactive. She improved with IV dextrose. Reports that she slept and forgot to eat. Her confusion improved enroute, was initially asking repeated questions.  She is back to her baseline now. No focal deficit.  LKW: 1949 Modified rankin score: 2-Slight disability-UNABLE to perform all activities but does not need assistance IV Thrombolysis: not offered, low suspicion for stroke. EVT: not offered, no deficits and low suspicion for stroke.   NIHSS components Score: Comment  1a Level of Conscious 0[x]  1[]  2[]  3[]      1b LOC Questions 0[x]  1[]  2[]       1c LOC Commands 0[x]  1[]  2[]       2 Best Gaze 0[x]  1[]  2[]       3 Visual 0[x]  1[]  2[]  3[]      4 Facial Palsy 0[x]  1[]  2[]  3[]      5a Motor Arm - left 0[x]  1[]  2[]  3[]  4[]  UN[]    5b Motor Arm - Right 0[x]  1[]  2[]  3[]  4[]  UN[]    6a Motor Leg - Left 0[x]  1[]  2[]  3[]  4[]  UN[]    6b Motor Leg - Right 0[x]  1[]  2[]  3[]  4[]  UN[]    7 Limb Ataxia 0[x]  1[]  2[]  3[]  UN[]     8 Sensory 0[x]  1[]  2[]  UN[]      9 Best Language 0[x]  1[]  2[]  3[]      10 Dysarthria 0[x]  1[]  2[]  UN[]      11 Extinct. and Inattention 0[x]  1[]  2[]       TOTAL: 0       ROS  Comprehensive ROS performed and pertinent positives documented in HPI   Past History   Past Medical History:  Diagnosis Date   Aortic stenosis, mild    Arthritis    Asthmatic bronchitis    with colds per patient   Carotid artery disease (HCC)    40-59% bilateral ICA stenosis   Cataract    Cutaneous abscess of right foot    Glaucoma    Heart murmur    Dr Katrinka Blazing is her  cardiologist.    Hyperlipemia    Hypertension    Dr. 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 Lynnette Caffey and Dr. Leafy Ro   Syncope    Type 1 diabetes mellitus [facility])     Past Surgical History:  Procedure Laterality Date   ANKLE FRACTURE SURGERY Left 2001   steel plate and 3 screws    ANTERIOR CERVICAL CORPECTOMY N/A 07/31/2014   Procedure: Cervical Four to Cervical  Six Corpectomy;  Surgeon: Karn Cassis, MD;  Location: [facility] NEURO ORS;  Service: Neurosurgery;  Laterality: N/A;  C4 to C6 Corpectomy   BREAST SURGERY  1988   CATARACT EXTRACTION W/ INTRAOCULAR LENS  IMPLANT, BILATERAL Bilateral 2011   right and then left   EYE SURGERY     FRACTURE SURGERY     HAMMER TOE SURGERY  1998   I & D EXTREMITY Right 09/27/2018   Procedure: IRRIGATION AND DEBRIDEMENT RIGHT FOOT;  Surgeon: Nadara Mustard, MD;  Location: [facility] OR;  Service: Orthopedics;  Laterality: Right;   INTRAOPERATIVE TRANSTHORACIC ECHOCARDIOGRAM N/A 02/06/2023   Procedure: INTRAOPERATIVE TRANSTHORACIC ECHOCARDIOGRAM;  Surgeon: Orbie Pyo, MD;  Location: [facility] OR;  Service: Open Heart Surgery;  Laterality: N/A;   JOINT REPLACEMENT     PACEMAKER IMPLANT N/A 02/10/2019   Procedure: PACEMAKER IMPLANT;  Surgeon: Marinus Maw, MD;  Location: [facility] INVASIVE CV LAB;  Service: Cardiovascular;  Laterality: N/A;   RIGHT/LEFT HEART CATH AND CORONARY ANGIOGRAPHY N/A 01/11/2023   Procedure: RIGHT/LEFT HEART CATH AND CORONARY  ANGIOGRAPHY;  Surgeon: Orbie Pyo, MD;  Location: [facility] INVASIVE CV LAB;  Service: Cardiovascular;  Laterality: N/A;   TONSILLECTOMY AND ADENOIDECTOMY  1948   TOTAL SHOULDER REPLACEMENT  2010   right shoulder    TRANSCATHETER AORTIC VALVE REPLACEMENT, TRANSFEMORAL N/A 02/06/2023   Procedure: Transcatheter Aortic Valve Replacement, Transfemoral;  Surgeon: Orbie Pyo, MD;  Location: [facility] OR;  Service: Open Heart Surgery;  Laterality: N/A;   TUBAL LIGATION  1979   TYMPANOSTOMY TUBE PLACEMENT Bilateral     Family History: Family History  Problem Relation Age of Onset   Cancer Mother    Heart Problems Father     Social History  reports that she has never smoked. She has never used smokeless tobacco. She reports current alcohol use of about 2.0 standard drinks of alcohol per week. She reports that she does not use drugs.  Allergies  Allergen Reactions   Cefaclor Anaphylaxis    Tolerated cephalexin in 2018   Tetracycline Anaphylaxis   Vancomycin Anaphylaxis   Alendronate Sodium Other (See Comments)   Augmentin [Amoxicillin-Pot Clavulanate] Other (See Comments)    Reaction unknown >> Anaphylaxis Has patient had a PCN reaction causing immediate rash, facial/tongue/throat swelling, SOB or lightheadedness with hypotension: Unknown Has patient had a PCN reaction causing severe rash involving mucus membranes or skin necrosis: Unknown Has patient had a PCN reaction that required hospitalization: Unknown Has patient had a PCN reaction occurring within the last 10 years: Unknown If all of the above answers are "NO", then may proceed with Cephalosporin use.    Nsaids     CKD   Quinapril Hcl     elevated potassium   Sulfamethoxazole-Trimethoprim     decreased kidney function   Prednisone Other (See Comments)    Reaction unknown    Medications   Current Facility-Administered Medications:    sodium chloride flush (NS) 0.9 % injection 3 mL, 3 mL, Intravenous, Once, Sponseller,  Rebekah R, PA-C  Current Outpatient Medications:    aspirin EC 81 MG tablet, Take 1 tablet (81 mg total) by mouth daily. Swallow whole., Disp: 30 tablet, Rfl: 12   azithromycin (ZITHROMAX) 500 MG tablet, Take 1 tablet (500 mg total) by mouth as needed. Take one tablet by mouth one hour prior to dental cleanings., Disp: 3 tablet, Rfl: 3   Cholecalciferol (VITAMIN D) 50 MCG (2000 UT) tablet, Take 2,000 Units by mouth daily., Disp: , Rfl:  Continuous Glucose Receiver (DEXCOM G7 RECEIVER) DEVI, See admin instructions., Disp: , Rfl:    Continuous Glucose Sensor (DEXCOM G7 SENSOR) MISC, , Disp: , Rfl:    Cyanocobalamin (B-12) 5000 MCG CAPS, Take 5,000 mcg by mouth daily., Disp: , Rfl:    ferrous sulfate 325 (65 FE) MG tablet, Take 325 mg by mouth daily with breakfast., Disp: , Rfl:    HUMALOG KWIKPEN 100 UNIT/ML KwikPen, Inject 0-10 Units into the skin 3 (three) times daily as needed (high blood sugar). Sliding Scale, Disp: , Rfl:    Insulin Glargine (LANTUS) 100 UNIT/ML Solostar Pen, Inject 13 Units into the skin daily. (Patient taking differently: Inject 10-12 Units into the skin in the morning.), Disp: 15 mL, Rfl: 11   losartan (COZAAR) 25 MG tablet, Take 0.5 tablets (12.5 mg total) by mouth at bedtime., Disp: 45 tablet, Rfl: 3   Menthol-Methyl Salicylate (SALONPAS PAIN RELIEF PATCH) PTCH, Apply 1 patch topically daily as needed (patch)., Disp: , Rfl:    montelukast (SINGULAIR) 10 MG tablet, Take 10 mg by mouth at bedtime., Disp: , Rfl:    Polyethyl Glycol-Propyl Glycol (SYSTANE OP), Place 1 drop into both eyes daily as needed (dry eyes)., Disp: , Rfl:    protein supplement shake (PREMIER PROTEIN) LIQD, Take 11 oz by mouth 2 (two) times daily. 30 mg of protein, Disp: , Rfl:    simvastatin (ZOCOR) 40 MG tablet, Take 40 mg by mouth at bedtime., Disp: , Rfl:   Vitals  There were no vitals filed for this visit.  There is no height or weight on file to calculate BMI.  Physical Exam   General:  Laying comfortably in bed; in no acute distress.  HENT: Normal oropharynx and mucosa. Normal external appearance of ears and nose.  Neck: Supple, no pain or tenderness  CV: No JVD. No peripheral edema.  Pulmonary: Symmetric Chest rise. Normal respiratory effort.  Abdomen: Soft to touch, non-tender.  Ext: No cyanosis, edema, or deformity  Skin: No rash. Normal palpation of skin.   Musculoskeletal: Normal digits and nails by inspection. No clubbing.   Neurologic Examination  Mental status/Cognition: Alert, oriented to self, place, month and year, good attention.  Speech/language: Fluent, comprehension intact, object naming intact, repetition intact.  Cranial nerves:   CN II R Pupil is 5mm and unreactive to light, L pupil is 3mm and unreactive to light, no VF deficits    CN III,IV,VI EOM intact, no gaze preference or deviation, no nystagmus    CN V normal sensation in V1, V2, and V3 segments bilaterally    CN VII no asymmetry, no nasolabial fold flattening    CN VIII normal hearing to speech    CN IX & X normal palatal elevation, no uvular deviation    CN XI 5/5 head turn and 5/5 shoulder shrug bilaterally    CN XII midline tongue protrusion    Motor:  Muscle bulk: poor, tone normal, pronator drift none tremor none Mvmt Root Nerve  Muscle Right Left Comments  SA C5/6 Ax Deltoid 5 5   EF C5/6 Mc Biceps 5 5   EE C6/7/8 Rad Triceps 5 5   WF C6/7 Med FCR     WE C7/8 PIN ECU     F Ab C8/T1 U ADM/FDI 5 5   HF L1/2/3 Fem Illopsoas 5 5   KE L2/3/4 Fem Quad 5 5   DF L4/5 D Peron Tib Ant 5 5   PF S1/2 Tibial Grc/Sol 5 5  Sensation:  Light touch Intact throughout   Pin prick    Temperature    Vibration   Proprioception    Coordination/Complex Motor:  - Finger to Nose intact BL - Heel to shin intact BL - Rapid alternating movement are normal. - Gait: deferred.  Labs/Imaging/Neurodiagnostic studies   CBC: No results for input(s): "WBC", "NEUTROABS", "HGB", "HCT", "MCV", "PLT" in  the last 168 hours. Basic Metabolic Panel:  Lab Results  Component Value Date   NA 140 10/23/2023   K 4.3 10/23/2023   CO2 28 10/23/2023   GLUCOSE 59 (L) 10/23/2023   BUN 9 10/23/2023   CREATININE 0.88 10/23/2023   CALCIUM 9.2 10/23/2023   GFRNONAA 56 (L) 02/07/2023   GFRAA >60 02/25/2019   Lipid Panel:  Lab Results  Component Value Date   LDLCALC 71 10/23/2023   HgbA1c:  Lab Results  Component Value Date   HGBA1C 9.4 (H) 10/14/2020   Urine Drug Screen: No results found for: "LABOPIA", "COCAINSCRNUR", "LABBENZ", "AMPHETMU", "THCU", "LABBARB"  Alcohol Level No results found for: "ETH" INR  Lab Results  Component Value Date   INR 1.0 02/02/2023   APTT No results found for: "APTT" AED levels: No results found for: "PHENYTOIN", "ZONISAMIDE", "LAMOTRIGINE", "LEVETIRACETA"  CT Head without contrast(Personally reviewed): CTH was negative for a large hypodensity concerning for a large territory infarct or hyperdensity concerning for an ICH  CT angio Head and Neck with contrast(Personally reviewed): No LVO, no aneurysm  ASSESSMENT   VERNESHA TALBOT is a 83 y.o. 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 She was found to be hypoglycemic and was given IV glucose with resolution of symptoms. She does have unequal pupils but no visual deficit, no blurred vision. I suspect that this is likely surgical pupil after cataract surgery. Will get CTA to rule out aneurysm.  Low overall suspicion for stroke given noted hypoglycemia with resolution of symptoms with administration of dextrose.  RECOMMENDATIONS  - CTH and CTA head and neck. - cancel code stroke. - no further workup if CTH and CTA are  nonrevealing. ______________________________________________________________________    Signed, Erick Blinks, MD [facility]

## 2024-02-10 NOTE — ED Provider Notes (Signed)
 [name] [facility] AT [facility] Provider Note   CSN: [id number] Arrival date & time: 02/10/24  2227  An emergency department physician performed an initial assessment on this suspected stroke patient at 2230.  History  Chief Complaint  Patient presents with   Code Stroke   Danielle Clarke is a 83 y.o. female with history of type 1 diabetes who presents to the emergency department as code stroke.  Met at the bridge by this provider and neurologist Dr. Derry Lory.  Airway patent.  Per EMS last known normal was 7 PM today, patient's cousin regularly checks on the patient who lives alone.  States that to this evening she called her and the patient did not answer, called EMS who presented and found the patient unconscious in a chair in her living room.  CBG was 43 at that time, unable to arouse the patient sufficiently to attempt oral glucose repletion therefore patient received 12.5 g of D10 via IV with improvement in her CBG and her mental status.  Per EMS some persistent circular reasoning and mild confusion but following commands and with resolution of initially noted left-sided facial droop.   Patient with history of type 1 diabetes since age of 75, patient contributes history that she symptoms a fall asleep in her chair and forget to eat dinner causing dips in her blood sugar.  Hx of recurrent syncope secondary to hypoglycemia in the past. Status post TAVR.  She is not anticoagulated. Patient with additional history of CKD stage III, glaucoma as well as of the syncope in the past.  Intracranial hemorrhage in 2020 after fall while she was at her dentist. HPI     Home Medications Prior to Admission medications   Medication Sig Start Date End Date Taking? Authorizing Provider  aspirin EC 81 MG tablet Take 1 tablet (81 mg total) by mouth daily. Swallow whole. 12/14/22   Orbie Pyo, MD  azithromycin (ZITHROMAX) 500 MG tablet Take 1 tablet (500 mg total) by mouth  as needed. Take one tablet by mouth one hour prior to dental cleanings. 02/15/23   [name] D, NP  Cholecalciferol (VITAMIN D) 50 MCG (2000 UT) tablet Take 2,000 Units by mouth daily.    [provider]  Continuous Glucose Receiver (DEXCOM G7 RECEIVER) DEVI See admin instructions. 09/19/23   [provider]  Continuous Glucose Sensor (DEXCOM G7 SENSOR) MISC  10/14/23   [provider]  Cyanocobalamin (B-12) 5000 MCG CAPS Take 5,000 mcg by mouth daily.    [provider]  ferrous sulfate 325 (65 FE) MG tablet Take 325 mg by mouth daily with breakfast.    [provider]  HUMALOG KWIKPEN 100 UNIT/ML KwikPen Inject 0-10 Units into the skin 3 (three) times daily as needed (high blood sugar). Sliding Scale 04/08/19   [provider]  Insulin Glargine (LANTUS) 100 UNIT/ML Solostar Pen Inject 13 Units into the skin daily. Patient taking differently: Inject 10-12 Units into the skin in the morning. 02/28/19   Angiulli, Mcarthur Rossetti, PA-C  losartan (COZAAR) 25 MG tablet Take 0.5 tablets (12.5 mg total) by mouth at bedtime. 10/15/23   Orbie Pyo, MD  Menthol-Methyl Salicylate (SALONPAS PAIN RELIEF PATCH) PTCH Apply 1 patch topically daily as needed (patch).    [provider]  montelukast (SINGULAIR) 10 MG tablet Take 10 mg by mouth at bedtime. 06/27/22   [provider]  Polyethyl Glycol-Propyl Glycol (SYSTANE OP) Place 1 drop into both eyes daily as  needed (dry eyes).    [provider]  protein supplement shake (PREMIER PROTEIN) LIQD Take 11 oz by mouth 2 (two) times daily. 30 mg of protein    [provider]  simvastatin (ZOCOR) 40 MG tablet Take 40 mg by mouth at bedtime. 03/20/20   [provider]      Allergies    Cefaclor, Tetracycline, Vancomycin, Alendronate sodium, Augmentin [amoxicillin-pot clavulanate], Nsaids, Quinapril hcl, Sulfamethoxazole-trimethoprim, and Prednisone    Review of Systems    Review of Systems  Unable to perform ROS: Acuity of condition  Neurological:  Positive for syncope and weakness.    Physical Exam Updated Vital Signs BP (!) 139/46   Pulse 79   Temp (!) 97.5 F (36.4 C) (Oral)   Resp 16   Wt (S) 46.2 kg   SpO2 99%   BMI 19.89 kg/m  Physical Exam Vitals and nursing note reviewed.  Constitutional:      Appearance: She is not toxic-appearing.  HENT:     Head: Normocephalic and atraumatic.     Mouth/Throat:     Mouth: Mucous membranes are moist.     Pharynx: No oropharyngeal exudate or posterior oropharyngeal erythema.  Eyes:     General:        Right eye: No discharge.        Left eye: No discharge.     Extraocular Movements: Extraocular movements intact.     Conjunctiva/sclera: Conjunctivae normal.  Cardiovascular:     Rate and Rhythm: Normal rate and regular rhythm.     Pulses: Normal pulses.     Heart sounds: Murmur heard.  Pulmonary:     Effort: Pulmonary effort is normal. No respiratory distress.     Breath sounds: Normal breath sounds. No wheezing or rales.  Abdominal:     General: Bowel sounds are normal. There is no distension.     Palpations: Abdomen is soft.     Tenderness: There is no abdominal tenderness. There is no guarding or rebound.  Musculoskeletal:        General: No deformity.     Cervical back: Neck supple.     Right lower leg: No edema.     Left lower leg: No edema.  Skin:    General: Skin is warm and dry.     Capillary Refill: Capillary refill takes less than 2 seconds.  Neurological:     General: No focal deficit present.     Mental Status: She is alert and oriented to person, place, and time. Mental status is at baseline.     GCS: GCS eye subscore is 4. GCS verbal subscore is 5. GCS motor subscore is 6.     Cranial Nerves: Cranial nerves 2-12 are intact.     Sensory: Sensation is intact.     Motor: Motor function is intact.     Coordination: Coordination is intact.     Gait: Gait is intact.   Psychiatric:        Mood and Affect: Mood normal.     ED Results / Procedures / Treatments   Labs (all labs ordered are listed, but only abnormal results are displayed) Labs Reviewed  CBC - Abnormal; Notable for the following components:      Result Value   WBC 12.4 (*)    All other components within normal limits  DIFFERENTIAL - Abnormal; Notable for the following components:   Neutro Abs 9.6 (*)    Abs Immature Granulocytes 0.20 (*)    All  other components within normal limits  COMPREHENSIVE METABOLIC PANEL - Abnormal; Notable for the following components:   Glucose, Bld 141 (*)    AST 14 (*)    Anion gap 16 (*)    All other components within normal limits  I-STAT CHEM 8, ED - Abnormal; Notable for the following components:   Glucose, Bld 136 (*)    Calcium, Ion 1.14 (*)    All other components within normal limits  CBG MONITORING, ED - Abnormal; Notable for the following components:   Glucose-Capillary 239 (*)    All other components within normal limits  PROTIME-INR  APTT  ETHANOL  CBG MONITORING, ED    EKG None  Radiology CT ANGIO HEAD NECK W WO CM (CODE STROKE) Result Date: 02/10/2024 CLINICAL DATA:  Acute neurologic deficit.  Left facial droop. EXAM: CT ANGIOGRAPHY HEAD AND NECK WITH AND WITHOUT CONTRAST TECHNIQUE: Multidetector CT imaging of the head and neck was performed using the standard protocol during bolus administration of intravenous contrast. Multiplanar CT image reconstructions and MIPs were obtained to evaluate the vascular anatomy. Carotid stenosis measurements (when applicable) are obtained utilizing NASCET criteria, using the distal internal carotid diameter as the denominator. RADIATION DOSE REDUCTION: This exam was performed according to the departmental dose-optimization program which includes automated exposure control, adjustment of the mA and/or kV according to patient size and/or use of iterative reconstruction technique. CONTRAST:  75mL  OMNIPAQUE IOHEXOL 350 MG/ML SOLN COMPARISON:  None Available. FINDINGS: CTA NECK FINDINGS Skeleton: C3-7 anterior plate fusion with associated graft material. Chronic fracture of the tip of the odontoid. Other neck: Normal pharynx, larynx and major salivary glands. No cervical lymphadenopathy. Unremarkable thyroid gland. Upper chest: No pneumothorax or pleural effusion. No nodules or masses. Aortic arch: There is calcific atherosclerosis of the aortic arch. Conventional 3 vessel aortic branching pattern. RIGHT carotid system: No dissection, occlusion or aneurysm. Mild atherosclerotic calcification at the carotid bifurcation without hemodynamically significant stenosis. LEFT carotid system: No dissection, occlusion or aneurysm. There is mixed density atherosclerosis extending into the proximal ICA, resulting in less than 50% stenosis. Vertebral arteries: Codominant configuration. There is no dissection, occlusion or flow-limiting stenosis to the skull base (V1-V3 segments). CTA HEAD FINDINGS POSTERIOR CIRCULATION: Mild bilateral atherosclerotic calcification of the V4 segments without stenosis. No proximal occlusion of the anterior or inferior cerebellar arteries. Basilar artery is normal. Superior cerebellar arteries are normal. Posterior cerebral arteries are normal. ANTERIOR CIRCULATION: Atherosclerotic calcification of the internal carotid arteries at the skull base without hemodynamically significant stenosis. Anterior cerebral arteries are normal. Middle cerebral arteries are normal. Venous sinuses: As permitted by contrast timing, patent. Anatomic variants: None Review of the MIP images confirms the above findings. IMPRESSION: 1. No emergent large vessel occlusion or hemodynamically significant stenosis of the head or neck. 2. Bilateral carotid bifurcation atherosclerosis without hemodynamically significant stenosis. 3. ICA atherosclerosis at the skull base without high-grade stenosis. Electronically Signed    By: Deatra Robinson M.D.   On: 02/10/2024 22:55   CT HEAD CODE STROKE WO CONTRAST Result Date: 02/10/2024 CLINICAL DATA:  Code stroke. Acute neurologic deficit. Left facial droop. EXAM: CT HEAD WITHOUT CONTRAST TECHNIQUE: Contiguous axial images were obtained from the base of the skull through the vertex without intravenous contrast. RADIATION DOSE REDUCTION: This exam was performed according to the departmental dose-optimization program which includes automated exposure control, adjustment of the mA and/or kV according to patient size and/or use of iterative reconstruction technique. COMPARISON:  11/19/2021 FINDINGS: Brain: There is no mass,  hemorrhage or extra-axial collection. Mild volume loss. Left frontal encephalomalacia is unchanged. Vascular: No abnormal hyperdensity of the major intracranial arteries or dural venous sinuses. No intracranial atherosclerosis. Skull: The visualized skull base, calvarium and extracranial soft tissues are normal. Sinuses/Orbits: Left ostiomeatal complex pattern sinusitis. The orbits are normal. ASPECTS [facility] Stroke Program Early CT Score) - Ganglionic level infarction (caudate, lentiform nuclei, internal capsule, insula, M1-M3 cortex): 7 - Supraganglionic infarction (M4-M6 cortex): 3 Total score (0-10 with 10 being normal): 10 IMPRESSION: 1. No acute intracranial abnormality. 2. ASPECTS is 10. 3. Unchanged left frontal encephalomalacia. These results were communicated to Dr. Erick Blinks at 10:48 pm on 02/10/2024 by text page via the [facility] messaging system. Electronically Signed   By: Deatra Robinson M.D.   On: 02/10/2024 22:49    Procedures .Critical Care  Performed by: Paris Lore, PA-C Authorized by: Paris Lore, PA-C   Critical care provider statement:    Critical care time (minutes):  45   Critical care was time spent personally by me on the following activities:  Development of treatment plan with patient or surrogate, discussions with  consultants, evaluation of patient's response to treatment, examination of patient, obtaining history from patient or surrogate, ordering and performing treatments and interventions, ordering and review of laboratory studies, ordering and review of radiographic studies, pulse oximetry and re-evaluation of patient's condition     Medications Ordered in ED Medications  sodium chloride flush (NS) 0.9 % injection 3 mL (0 mLs Intravenous Hold 02/10/24 2309)  iohexol (OMNIPAQUE) 350 MG/ML injection 75 mL (75 mLs Intravenous Contrast Given 02/10/24 2247)    ED Course/ Medical Decision Making/ A&P Clinical Course as of 02/11/24 0203  Sun Feb 10, 2024  2248 Dr. Derry Lory, neurologist, canceled Code Stroke; no further neurologic workup indicated per neuro. I appreciate his collaboration in the care of this patient.  [RS]    Clinical Course User Index [RS] Sharad Vaneaton, Eugene Gavia, PA-C                                 Medical Decision Making 83 year old female presented as a code stroke with hypoglycemia in the field.  Hypertensive on intake and vitals otherwise normal.  Cardiopulmonary and abdominal exams are benign.  Patient with GCS of 15 at this time.  Nonfocal neurologic exam though globally weak.  The differential for syncope is extensive and includes, but is not limited to: arrythmia (Vtach, SVT, SSS, sinus arrest, AV block, bradycardia), aortic stenosis, AMI, hypertrophic obstructive cardiomyopathy (HOCM), PE, atrial myxoma, pulmonary hypertension, orthostatic hypotension, hypovolemia, drug effect, GB syndrome, micturition, cough, carotid sinus sensitivity, seizure, TIA/CVA, hypoglycemia, vertigo.   Amount and/or Complexity of Data Reviewed Labs: ordered.    Details: CBG on intake to 59, CMP with mild anion gap of 16.  CBC with leukocytosis of 12.4.  Repeat CBG 239.     Patient allowed to remain in the emergency department for observation for 3 hours with maintenance of her CBG and  cognitive status.  GCS of 15, nonfocal neurologic exam on repeat evaluation.  Clinical patient most consistent with hypoglycemia as etiology of patient's altered mental status and syncope.  No evidence of neurologic emergency at this time.  Recommend close outpatient follow-up with her primary care doctor for further discussion of adjustments for her insulin.  Also encouraged family to proceed with continuous glucose monitor patient was recently prescribed.  Khamiya and her daughter Alferd Patee voiced understanding  of her medical evaluation and treatment plan. Each of their questions answered to their expressed satisfaction.  Return precautions were given.  Patient is well-appearing, stable, and was discharged in good condition.  This chart was dictated using voice recognition software, Dragon. Despite the best efforts of this provider to proofread and correct errors, errors may still occur which can change documentation meaning.         Final Clinical Impression(s) / ED Diagnoses Final diagnoses:  Hypoglycemia    Rx / DC Orders ED Discharge Orders     None         Sherrilee Gilles 02/11/24 2952    Gwyneth Sprout, MD 02/13/24 [phone]

## 2024-02-10 NOTE — ED Notes (Signed)
 PA Rebekah updated on rectal temp of 93.3. pt remains A&Ox4, family at bedside. Pt provided with bear hugger low hair and medium heat. Warm blankets provided. Room temp increased. Pt and family updated in POC.

## 2024-02-10 NOTE — ED Triage Notes (Signed)
 CODE STROKE   Pt arrives via [facility] from home as an activated Code Stroke at 21:14. LNK 1930  Pt at home with caregiver who verbalized concern for AMS at 2058. Initial CBG 43 by fire. Pt at that time was confused, decreased responsiveness, left facial droop, left sided weakness, and unequal pupils. Pt glucose corrected with 15 g D10. Repeat CBG 317 with EMS. Despite correction of glucose EMS reported concern for repetitive questions and pt not following commands Pt has hx DM type 1 and HX ICH.

## 2024-02-11 LAB — CBG MONITORING, ED
Glucose-Capillary: 141 mg/dL — ABNORMAL HIGH (ref 70–99)
Glucose-Capillary: 239 mg/dL — ABNORMAL HIGH (ref 70–99)

## 2024-02-11 NOTE — ED Notes (Signed)
 PA updated with repeat CBG 239 and oral T 97.5.

## 2024-02-11 NOTE — ED Notes (Signed)
 AVS provided by edp was reviewed with pt and pts daughter at bedside. Pt and daughter were able to verbalize understanding with no additional questions at this time. Pt wheelchair to car

## 2024-02-11 NOTE — Discharge Instructions (Addendum)
 You were seen in the ER today for your syncopal episode, your blood sugar was low.  The rest of your workup was reassuring.  You have not had a stroke.  Please follow-up closely with your primary care doctor to discuss adjustments in your insulin, in order to prevent another hypoglycemic episode like tonight.  Please also ensure you are eating full meals regularly and staying well-hydrated.  Return to the ER with any new severe symptom.

## 2024-02-11 NOTE — Code Documentation (Signed)
 Responded to Code Stroke called at 2214 for AMS and L facial droop, [id number]. Per EMS, pt was hypoglycemic on arrival and was given dextrose. Pt arrived at 2227, CBG-141, NIH-0, CT head negative for acute changed, CTA-no LVO. Code Stroke cancelled at 2246.

## 2024-02-15 ENCOUNTER — Ambulatory Visit: Payer: Medicare PPO | Admitting: Physician Assistant

## 2024-02-15 ENCOUNTER — Ambulatory Visit: Payer: Medicare PPO | Attending: Cardiology

## 2024-02-15 ENCOUNTER — Other Ambulatory Visit (HOSPITAL_COMMUNITY): Payer: Self-pay | Admitting: Physician Assistant

## 2024-02-15 VITALS — BP 102/62 | HR 77 | Ht 60.0 in | Wt 99.8 lb

## 2024-02-15 DIAGNOSIS — N182 Chronic kidney disease, stage 2 (mild): Secondary | ICD-10-CM | POA: Insufficient documentation

## 2024-02-15 DIAGNOSIS — Z950 Presence of cardiac pacemaker: Secondary | ICD-10-CM | POA: Diagnosis not present

## 2024-02-15 DIAGNOSIS — E1022 Type 1 diabetes mellitus with diabetic chronic kidney disease: Secondary | ICD-10-CM

## 2024-02-15 DIAGNOSIS — Z952 Presence of prosthetic heart valve: Secondary | ICD-10-CM

## 2024-02-15 LAB — ECHOCARDIOGRAM COMPLETE
AR max vel: 1.01 cm2
AV Area VTI: 1.33 cm2
AV Area mean vel: 0.9 cm2
AV Mean grad: 26 mmHg
AV Peak grad: 41.2 mmHg
Ao pk vel: 3.21 m/s
Area-P 1/2: 3.85 cm2
S' Lateral: 2.9 cm

## 2024-02-15 NOTE — Patient Instructions (Signed)
 Medication Instructions:  Your physician recommends that you continue on your current medications as directed. Please refer to the Current Medication list given to you today.  *If you need a refill on your cardiac medications before your next appointment, please call your pharmacy*   Lab Work: none If you have labs (blood work) drawn today and your tests are completely normal, you will receive your results only by: MyChart Message (if you have MyChart) OR A paper copy in the mail If you have any lab test that is abnormal or we need to change your treatment, we will call you to review the results.   Testing/Procedures: none   Follow-Up: At [facility], you and your health needs are our priority.  As part of our continuing mission to provide you with exceptional heart care, we have created designated Provider Care Teams.  These Care Teams include your primary Cardiologist (physician) and Advanced Practice Providers (APPs -  Physician Assistants and Nurse Practitioners) who all work together to provide you with the care you need, when you need it.  We recommend signing up for the patient portal called "MyChart".  Sign up information is provided on this After Visit Summary.  MyChart is used to connect with patients for Virtual Visits (Telemedicine).  Patients are able to view lab/test results, encounter notes, upcoming appointments, etc.  Non-urgent messages can be sent to your provider as well.   To learn more about what you can do with MyChart, go to [email or web address].    Your next appointment:   Office will call you to schedule follow up and any needed testing  Provider:       Other Instructions

## 2024-02-15 NOTE — Progress Notes (Signed)
 [facility]   MULTIDISCIPLINARY HEART VALVE CLINIC                                     Cardiology Office Note:    Date:  02/16/2024   ID:  Danielle Clarke, DOB [birth year], MRN [id number]  PCP:  Ollen Bowl, MD  [facility] Cardiologist:  Orbie Pyo, MD  [facility] Electrophysiologist:  None   Referring MD: Ollen Bowl, MD   1 year s/p TAVR  History of Present Illness:    Danielle Clarke is a 83 y.o. female with a hx of bradycardia s/p PPM placement 2020, ICH secondary to fall, moderate MR, IDDM, HLD, and severe aortic stenosis s/p TAVR (02/06/23) who presents to clinic for follow up.  She was previously followed by Dr. Katrinka Blazing and transferred care to Dr. Lynnette Caffey after her TAVR. Underwent TAVR with a 20 mm Edwards Sapien 3 THV via the TF approach on 02/06/23. Per review of notes her anatomy was not sufficient for a [employer] supra annular valve. She was continued on ASA 81mg  monotherapy.  [facility] 2/15/4 showed minimal non obst CAD.   Today the patient presents to clinic for follow up. Here with her friend Vernona Rieger. Very hungry and feels hypoglycemic - wants to go to lunch. Recently seen in ER on 02/10/24 for code stroke but ended up being profound hypoglycemia. CTA head reassuring. No CP or SOB. Chronic mild LLE ankle swelling from old surgery, but no orthopnea or PND. No dizziness or syncope. No blood in stool or urine. No palpitations.   Past Medical History:  Diagnosis Date   Aortic stenosis, mild    Arthritis    Asthmatic bronchitis    with colds per patient   Carotid artery disease (HCC)    40-59% bilateral ICA stenosis   Cataract    Cutaneous abscess of right foot    Glaucoma    Heart murmur    Dr Katrinka Blazing is her  cardiologist.    Hyperlipemia    Hypertension    Dr. 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  Lynnette Caffey and Dr. Leafy Ro   Syncope    Type 1 diabetes mellitus (HCC)      Current Medications: Current Meds  Medication Sig   aspirin EC 81 MG tablet Take 1 tablet (81 mg total) by mouth daily. Swallow whole.   HUMALOG KWIKPEN 100 UNIT/ML KwikPen Inject 0-10 Units into the skin 3 (three) times daily as needed (high blood sugar). Sliding Scale   Insulin Glargine (LANTUS) 100 UNIT/ML Solostar Pen Inject 13 Units into the skin daily. (Patient taking differently: Inject 10-12 Units into the skin in the morning.)   metoprolol tartrate (LOPRESSOR) 25 MG tablet Take 1 tablet (25 mg total) by mouth as directed for 1 dose.   Polyethyl Glycol-Propyl Glycol (SYSTANE OP) Place 1 drop into both eyes daily as needed (dry eyes).   protein supplement shake (PREMIER PROTEIN) LIQD Take 11 oz by mouth 2 (two) times daily. 30 mg of protein   simvastatin (ZOCOR) 40 MG tablet Take 40 mg by mouth at bedtime.      ROS:   Please see the history of present illness.    All other systems reviewed  and are negative.  EKGs       Risk Assessment/Calculations:           Physical Exam:    VS:  BP 102/62   Pulse 77   Ht 5' (1.524 m)   Wt 99 lb 12.8 oz (45.3 kg)   SpO2 95%   BMI 19.49 kg/m     Wt Readings from Last 3 Encounters:  02/15/24 99 lb 12.8 oz (45.3 kg)  02/10/24 (S) 101 lb 13.6 oz (46.2 kg)  10/15/23 102 lb 12.8 oz (46.6 kg)     GEN: Well nourished, well developed in no acute distress NECK: No JVD CARDIAC: RRR, 3/6 SEM heard throughout precordium. No rubs, gallops RESPIRATORY:  Clear to auscultation without rales, wheezing or rhonchi  ABDOMEN: Soft, non-tender, non-distended EXTREMITIES:  No edema; No deformity.    ASSESSMENT:    1. S/P TAVR (transcatheter aortic valve replacement)   2. Cardiac pacemaker in situ   3. 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 -- Review of POD1 and one month echo show an increase in mean gradient from 11-13 mm hg to 26 mm hg.  I would expect a higher gradient due to PPM with a size 20mm valve, but she has a tiny body habitus and louder murmur on exam and this is a significant jump from previous studies. Cardiac CT to rule out subclinical leaflet thrombosis (HALT/HAM) set up for 03/07/24. -- NYHA class I symptoms.  -- Continue Aspirin 81mg  daily.  -- SBE prophylaxis discussed. She has azithromycin.  -- Will arrange follow up after CT.   S/p PPM: -- Followed by Dr. Ladona Ridgel  IDDM Type I:  -- Recently seen in ER for hypoglycemia. -- Continue insulin.    Medication Adjustments/Labs and Tests Ordered: Current medicines are reviewed at length with the patient today.  Concerns regarding medicines are outlined above.  Orders Placed This Encounter  Procedures   CT CORONARY MORPH W/CTA COR W/SCORE W/CA W/CM &/OR WO/CM   Meds ordered this encounter  Medications   metoprolol tartrate (LOPRESSOR) 25 MG tablet    Sig: Take 1 tablet (25 mg total) by mouth as directed for 1 dose.    Dispense:  1 tablet    Refill:  0    Take before CT scans to slow HR for better imaging.    Supervising Provider:   Tonny Bollman [[id number]]    Patient Instructions  Medication Instructions:  Your physician recommends that you continue on your current medications as directed. Please refer to the Current Medication list given to you today.  *If you need a refill on your cardiac medications before your next appointment, please call your pharmacy*   Lab Work: none If you have labs (blood work) drawn today and your tests are completely normal, you will receive your results only by: MyChart Message (if you have MyChart) OR A paper copy in the mail If you have any lab test that is abnormal or we need to change your treatment, we will  call you to review the results.   Testing/Procedures: none   Follow-Up: At [facility], you and your health needs are our priority.  As part of  our continuing mission to provide you with exceptional heart care, we have created designated Provider Care Teams.  These Care Teams include your primary Cardiologist (physician) and Advanced Practice Providers (APPs -  Physician Assistants and Nurse Practitioners) who all work together to provide you with the care you need, when you need it.  We recommend signing up for the patient portal called "MyChart".  Sign up information is provided on this After Visit Summary.  MyChart is used to connect with patients for Virtual Visits (Telemedicine).  Patients are able to view lab/test results, encounter notes, upcoming appointments, etc.  Non-urgent messages can be sent to your provider as well.   To learn more about what you can do with MyChart, go to [email or web address].    Your next appointment:   Office will call you to schedule follow up and any needed testing  Provider:       Other Instructions       Signed, Cline Crock, PA-C  02/16/2024 12:07 PM    [facility]

## 2024-02-16 ENCOUNTER — Encounter: Payer: Self-pay | Admitting: Physician Assistant

## 2024-02-16 MED ORDER — METOPROLOL TARTRATE 25 MG PO TABS: 25.0000 mg | ORAL_TABLET | ORAL | 0 refills | Status: DC

## 2024-02-16 NOTE — Progress Notes (Signed)
 CT letter

## 2024-02-22 ENCOUNTER — Ambulatory Visit (INDEPENDENT_AMBULATORY_CARE_PROVIDER_SITE_OTHER): Payer: Medicare HMO

## 2024-02-22 DIAGNOSIS — I495 Sick sinus syndrome: Secondary | ICD-10-CM

## 2024-02-25 LAB — CUP PACEART REMOTE DEVICE CHECK
Battery Voltage: 65
Date Time Interrogation Session: 20250328085043
Implantable Lead Connection Status: 753985
Implantable Lead Connection Status: 753985
Implantable Lead Implant Date: 20200316
Implantable Lead Implant Date: 20200316
Implantable Lead Location: 753859
Implantable Lead Location: 753860
Implantable Lead Model: 377
Implantable Lead Model: 377
Implantable Lead Serial Number: 81099636
Implantable Lead Serial Number: 81165051
Implantable Pulse Generator Implant Date: 20200316
Pulse Gen Model: 407145
Pulse Gen Serial Number: 69557134

## 2024-03-02 ENCOUNTER — Encounter: Payer: Self-pay | Admitting: Internal Medicine

## 2024-03-06 ENCOUNTER — Telehealth: Payer: Self-pay | Admitting: *Deleted

## 2024-03-06 NOTE — Telephone Encounter (Signed)
 Device alert received from [employer]. No EGM provided. No OAC listed in EPIC. AT/AF burden 12%.   Reached out to patient to notify them of the alert from their device and to also notify them that they will be referred to the AF clinic for further evaluation.

## 2024-03-07 ENCOUNTER — Ambulatory Visit (HOSPITAL_COMMUNITY): Admission: RE | Admit: 2024-03-07 | Source: Ambulatory Visit

## 2024-03-07 ENCOUNTER — Encounter (HOSPITAL_COMMUNITY): Payer: Self-pay

## 2024-03-07 ENCOUNTER — Ambulatory Visit (HOSPITAL_COMMUNITY)
Admission: RE | Admit: 2024-03-07 | Discharge: 2024-03-07 | Disposition: A | Discharge status: Home / Self Care | Source: Ambulatory Visit | Attending: Internal Medicine | Admitting: Internal Medicine

## 2024-03-07 DIAGNOSIS — S161XXA Strain of muscle, fascia and tendon at neck level, initial encounter: Secondary | ICD-10-CM | POA: Diagnosis not present

## 2024-03-07 DIAGNOSIS — M542 Cervicalgia: Secondary | ICD-10-CM | POA: Diagnosis not present

## 2024-03-07 DIAGNOSIS — S46819A Strain of other muscles, fascia and tendons at shoulder and upper arm level, unspecified arm, initial encounter: Secondary | ICD-10-CM | POA: Diagnosis not present

## 2024-03-07 DIAGNOSIS — Z952 Presence of prosthetic heart valve: Secondary | ICD-10-CM

## 2024-03-07 NOTE — Progress Notes (Signed)
 Pt. To IR for scheduled cardiac CT. Pt. Educated on CT and all questions answered. PIV attempted x2. Pt. Declined any more PIV attempts at this time. Per pt., pain today is too high due to previous muscle injury and any PIV attempts were "too much" on top of already present pain. Pt. Educated on rescheduling need. All questions answered, CT notified, and cardiac RN tem notified.

## 2024-03-13 DIAGNOSIS — S46819A Strain of other muscles, fascia and tendons at shoulder and upper arm level, unspecified arm, initial encounter: Secondary | ICD-10-CM | POA: Diagnosis not present

## 2024-03-13 DIAGNOSIS — R636 Underweight: Secondary | ICD-10-CM | POA: Diagnosis not present

## 2024-03-13 NOTE — Telephone Encounter (Signed)
 agree

## 2024-03-28 DIAGNOSIS — E46 Unspecified protein-calorie malnutrition: Secondary | ICD-10-CM | POA: Diagnosis not present

## 2024-03-28 DIAGNOSIS — R54 Age-related physical debility: Secondary | ICD-10-CM | POA: Diagnosis not present

## 2024-03-28 DIAGNOSIS — B029 Zoster without complications: Secondary | ICD-10-CM | POA: Diagnosis not present

## 2024-04-01 ENCOUNTER — Emergency Department (HOSPITAL_COMMUNITY)

## 2024-04-01 ENCOUNTER — Encounter (HOSPITAL_COMMUNITY): Payer: Self-pay

## 2024-04-01 ENCOUNTER — Inpatient Hospital Stay (HOSPITAL_COMMUNITY)

## 2024-04-01 ENCOUNTER — Other Ambulatory Visit: Payer: Self-pay

## 2024-04-01 ENCOUNTER — Inpatient Hospital Stay (HOSPITAL_COMMUNITY)
Admission: EM | Admit: 2024-04-01 | Discharge: 2024-04-04 | DRG: 177 | Disposition: A | Discharge status: Skilled Nursing Facility | Attending: Internal Medicine | Admitting: Internal Medicine

## 2024-04-01 DIAGNOSIS — R4020 Unspecified coma: Secondary | ICD-10-CM

## 2024-04-01 DIAGNOSIS — I6523 Occlusion and stenosis of bilateral carotid arteries: Secondary | ICD-10-CM | POA: Diagnosis present

## 2024-04-01 DIAGNOSIS — T17998A Other foreign object in respiratory tract, part unspecified causing other injury, initial encounter: Secondary | ICD-10-CM | POA: Diagnosis present

## 2024-04-01 DIAGNOSIS — K225 Diverticulum of esophagus, acquired: Secondary | ICD-10-CM | POA: Diagnosis not present

## 2024-04-01 DIAGNOSIS — Z881 Allergy status to other antibiotic agents status: Secondary | ICD-10-CM | POA: Diagnosis not present

## 2024-04-01 DIAGNOSIS — R2689 Other abnormalities of gait and mobility: Secondary | ICD-10-CM | POA: Diagnosis not present

## 2024-04-01 DIAGNOSIS — R0602 Shortness of breath: Secondary | ICD-10-CM | POA: Diagnosis present

## 2024-04-01 DIAGNOSIS — R55 Syncope and collapse: Secondary | ICD-10-CM | POA: Diagnosis present

## 2024-04-01 DIAGNOSIS — Z9841 Cataract extraction status, right eye: Secondary | ICD-10-CM | POA: Diagnosis not present

## 2024-04-01 DIAGNOSIS — R0689 Other abnormalities of breathing: Secondary | ICD-10-CM | POA: Diagnosis not present

## 2024-04-01 DIAGNOSIS — J45909 Unspecified asthma, uncomplicated: Secondary | ICD-10-CM | POA: Diagnosis present

## 2024-04-01 DIAGNOSIS — I350 Nonrheumatic aortic (valve) stenosis: Secondary | ICD-10-CM | POA: Diagnosis present

## 2024-04-01 DIAGNOSIS — K224 Dyskinesia of esophagus: Secondary | ICD-10-CM | POA: Diagnosis not present

## 2024-04-01 DIAGNOSIS — R011 Cardiac murmur, unspecified: Secondary | ICD-10-CM | POA: Diagnosis present

## 2024-04-01 DIAGNOSIS — T17908A Unspecified foreign body in respiratory tract, part unspecified causing other injury, initial encounter: Secondary | ICD-10-CM

## 2024-04-01 DIAGNOSIS — S22080A Wedge compression fracture of T11-T12 vertebra, initial encounter for closed fracture: Secondary | ICD-10-CM

## 2024-04-01 DIAGNOSIS — Z961 Presence of intraocular lens: Secondary | ICD-10-CM | POA: Diagnosis present

## 2024-04-01 DIAGNOSIS — M549 Dorsalgia, unspecified: Secondary | ICD-10-CM | POA: Diagnosis not present

## 2024-04-01 DIAGNOSIS — T17308A Unspecified foreign body in larynx causing other injury, initial encounter: Secondary | ICD-10-CM | POA: Diagnosis present

## 2024-04-01 DIAGNOSIS — B029 Zoster without complications: Secondary | ICD-10-CM

## 2024-04-01 DIAGNOSIS — R0902 Hypoxemia: Principal | ICD-10-CM

## 2024-04-01 DIAGNOSIS — J9621 Acute and chronic respiratory failure with hypoxia: Secondary | ICD-10-CM | POA: Diagnosis not present

## 2024-04-01 DIAGNOSIS — R918 Other nonspecific abnormal finding of lung field: Secondary | ICD-10-CM | POA: Diagnosis not present

## 2024-04-01 DIAGNOSIS — W448XXA Other foreign body entering into or through a natural orifice, initial encounter: Secondary | ICD-10-CM | POA: Diagnosis present

## 2024-04-01 DIAGNOSIS — Z9842 Cataract extraction status, left eye: Secondary | ICD-10-CM

## 2024-04-01 DIAGNOSIS — Z1152 Encounter for screening for COVID-19: Secondary | ICD-10-CM | POA: Diagnosis not present

## 2024-04-01 DIAGNOSIS — G9389 Other specified disorders of brain: Secondary | ICD-10-CM | POA: Diagnosis not present

## 2024-04-01 DIAGNOSIS — R4182 Altered mental status, unspecified: Secondary | ICD-10-CM | POA: Diagnosis not present

## 2024-04-01 DIAGNOSIS — H409 Unspecified glaucoma: Secondary | ICD-10-CM | POA: Diagnosis present

## 2024-04-01 DIAGNOSIS — R41841 Cognitive communication deficit: Secondary | ICD-10-CM | POA: Diagnosis not present

## 2024-04-01 DIAGNOSIS — M1990 Unspecified osteoarthritis, unspecified site: Secondary | ICD-10-CM | POA: Diagnosis present

## 2024-04-01 DIAGNOSIS — J984 Other disorders of lung: Secondary | ICD-10-CM

## 2024-04-01 DIAGNOSIS — S2241XA Multiple fractures of ribs, right side, initial encounter for closed fracture: Secondary | ICD-10-CM | POA: Diagnosis not present

## 2024-04-01 DIAGNOSIS — J9601 Acute respiratory failure with hypoxia: Secondary | ICD-10-CM

## 2024-04-01 DIAGNOSIS — Z952 Presence of prosthetic heart valve: Secondary | ICD-10-CM

## 2024-04-01 DIAGNOSIS — Z681 Body mass index (BMI) 19 or less, adult: Secondary | ICD-10-CM

## 2024-04-01 DIAGNOSIS — I10 Essential (primary) hypertension: Secondary | ICD-10-CM | POA: Diagnosis present

## 2024-04-01 DIAGNOSIS — R001 Bradycardia, unspecified: Secondary | ICD-10-CM | POA: Diagnosis not present

## 2024-04-01 DIAGNOSIS — M810 Age-related osteoporosis without current pathological fracture: Secondary | ICD-10-CM | POA: Diagnosis present

## 2024-04-01 DIAGNOSIS — M4854XA Collapsed vertebra, not elsewhere classified, thoracic region, initial encounter for fracture: Secondary | ICD-10-CM | POA: Diagnosis present

## 2024-04-01 DIAGNOSIS — Z96611 Presence of right artificial shoulder joint: Secondary | ICD-10-CM | POA: Diagnosis present

## 2024-04-01 DIAGNOSIS — E109 Type 1 diabetes mellitus without complications: Secondary | ICD-10-CM | POA: Diagnosis present

## 2024-04-01 DIAGNOSIS — R1312 Dysphagia, oropharyngeal phase: Secondary | ICD-10-CM | POA: Diagnosis not present

## 2024-04-01 DIAGNOSIS — J9600 Acute respiratory failure, unspecified whether with hypoxia or hypercapnia: Secondary | ICD-10-CM | POA: Diagnosis not present

## 2024-04-01 DIAGNOSIS — J690 Pneumonitis due to inhalation of food and vomit: Principal | ICD-10-CM | POA: Diagnosis present

## 2024-04-01 DIAGNOSIS — Z880 Allergy status to penicillin: Secondary | ICD-10-CM

## 2024-04-01 DIAGNOSIS — T17920A Food in respiratory tract, part unspecified causing asphyxiation, initial encounter: Secondary | ICD-10-CM | POA: Diagnosis not present

## 2024-04-01 DIAGNOSIS — Z886 Allergy status to analgesic agent status: Secondary | ICD-10-CM

## 2024-04-01 DIAGNOSIS — E785 Hyperlipidemia, unspecified: Secondary | ICD-10-CM | POA: Diagnosis present

## 2024-04-01 DIAGNOSIS — M6281 Muscle weakness (generalized): Secondary | ICD-10-CM | POA: Diagnosis not present

## 2024-04-01 DIAGNOSIS — J189 Pneumonia, unspecified organism: Secondary | ICD-10-CM | POA: Diagnosis not present

## 2024-04-01 DIAGNOSIS — Z950 Presence of cardiac pacemaker: Secondary | ICD-10-CM | POA: Diagnosis not present

## 2024-04-01 DIAGNOSIS — E43 Unspecified severe protein-calorie malnutrition: Secondary | ICD-10-CM | POA: Diagnosis not present

## 2024-04-01 DIAGNOSIS — Z79899 Other long term (current) drug therapy: Secondary | ICD-10-CM

## 2024-04-01 DIAGNOSIS — Z7982 Long term (current) use of aspirin: Secondary | ICD-10-CM

## 2024-04-01 DIAGNOSIS — M47816 Spondylosis without myelopathy or radiculopathy, lumbar region: Secondary | ICD-10-CM | POA: Diagnosis not present

## 2024-04-01 DIAGNOSIS — I959 Hypotension, unspecified: Secondary | ICD-10-CM | POA: Diagnosis not present

## 2024-04-01 DIAGNOSIS — Z9181 History of falling: Secondary | ICD-10-CM

## 2024-04-01 DIAGNOSIS — R1310 Dysphagia, unspecified: Secondary | ICD-10-CM | POA: Diagnosis not present

## 2024-04-01 DIAGNOSIS — Z79818 Long term (current) use of other agents affecting estrogen receptors and estrogen levels: Secondary | ICD-10-CM

## 2024-04-01 DIAGNOSIS — Z7401 Bed confinement status: Secondary | ICD-10-CM | POA: Diagnosis not present

## 2024-04-01 DIAGNOSIS — I213 ST elevation (STEMI) myocardial infarction of unspecified site: Secondary | ICD-10-CM | POA: Diagnosis not present

## 2024-04-01 DIAGNOSIS — Z794 Long term (current) use of insulin: Secondary | ICD-10-CM

## 2024-04-01 DIAGNOSIS — I3481 Nonrheumatic mitral (valve) annulus calcification: Secondary | ICD-10-CM | POA: Diagnosis not present

## 2024-04-01 DIAGNOSIS — Z888 Allergy status to other drugs, medicaments and biological substances status: Secondary | ICD-10-CM

## 2024-04-01 DIAGNOSIS — S22080D Wedge compression fracture of T11-T12 vertebra, subsequent encounter for fracture with routine healing: Secondary | ICD-10-CM | POA: Diagnosis not present

## 2024-04-01 DIAGNOSIS — Z882 Allergy status to sulfonamides status: Secondary | ICD-10-CM

## 2024-04-01 DIAGNOSIS — R000 Tachycardia, unspecified: Secondary | ICD-10-CM | POA: Diagnosis not present

## 2024-04-01 LAB — COMPREHENSIVE METABOLIC PANEL WITH GFR
ALT: 13 U/L (ref 0–44)
AST: 19 U/L (ref 15–41)
Albumin: 2.4 g/dL — ABNORMAL LOW (ref 3.5–5.0)
Alkaline Phosphatase: 101 U/L (ref 38–126)
Anion gap: 13 (ref 5–15)
BUN: 32 mg/dL — ABNORMAL HIGH (ref 8–23)
CO2: 26 mmol/L (ref 22–32)
Calcium: 8.8 mg/dL — ABNORMAL LOW (ref 8.9–10.3)
Chloride: 91 mmol/L — ABNORMAL LOW (ref 98–111)
Creatinine, Ser: 0.78 mg/dL (ref 0.44–1.00)
GFR, Estimated: >60 mL/min (ref >60)
Glucose, Bld: 270 mg/dL — ABNORMAL HIGH (ref 70–99)
Potassium: 4 mmol/L (ref 3.5–5.1)
Sodium: 130 mmol/L — ABNORMAL LOW (ref 135–145)
Total Bilirubin: 0.5 mg/dL (ref 0.0–1.2)
Total Protein: 7 g/dL (ref 6.5–8.1)

## 2024-04-01 LAB — CBC
HCT: 40.7 % (ref 36.0–46.0)
Hemoglobin: 13.4 g/dL (ref 12.0–15.0)
MCH: 27.5 pg (ref 26.0–34.0)
MCHC: 32.9 g/dL (ref 30.0–36.0)
MCV: 83.4 fL (ref 80.0–100.0)
Platelets: 258 10*3/uL (ref 150–400)
RBC: 4.88 MIL/uL (ref 3.87–5.11)
RDW: 14.1 % (ref 11.5–15.5)
WBC: 14.5 10*3/uL — ABNORMAL HIGH (ref 4.0–10.5)
nRBC: 0 % (ref 0.0–0.2)

## 2024-04-01 LAB — HEMOGLOBIN A1C
Hgb A1c MFr Bld: 9.7 % — ABNORMAL HIGH (ref 4.8–5.6)
Mean Plasma Glucose: 231.69 mg/dL

## 2024-04-01 LAB — CBG MONITORING, ED: Glucose-Capillary: 256 mg/dL — ABNORMAL HIGH (ref 70–99)

## 2024-04-01 LAB — LACTIC ACID, PLASMA
Lactic Acid, Venous: 1.4 mmol/L (ref 0.5–1.9)
Lactic Acid, Venous: 1 mmol/L (ref 0.5–1.9)

## 2024-04-01 LAB — LIPASE, BLOOD: Lipase: 25 U/L (ref 11–51)

## 2024-04-01 LAB — AMMONIA: Ammonia: 16 umol/L (ref 9–35)

## 2024-04-01 LAB — RESP PANEL BY RT-PCR (RSV, FLU A&B, COVID)  RVPGX2
Influenza A by PCR: NEGATIVE
Influenza B by PCR: NEGATIVE
Resp Syncytial Virus by PCR: NEGATIVE
SARS Coronavirus 2 by RT PCR: NEGATIVE

## 2024-04-01 LAB — PHOSPHORUS: Phosphorus: 3.5 mg/dL (ref 2.5–4.6)

## 2024-04-01 LAB — TSH: TSH: 3.035 u[IU]/mL (ref 0.350–4.500)

## 2024-04-01 LAB — MAGNESIUM: Magnesium: 1.9 mg/dL (ref 1.7–2.4)

## 2024-04-01 MED ORDER — SODIUM CHLORIDE 0.9 % IV BOLUS
500.0000 mL | Freq: Once | INTRAVENOUS | Status: AC
  Administered 2024-04-01: 500 mL via INTRAVENOUS

## 2024-04-01 MED ORDER — ACETAMINOPHEN 650 MG RE SUPP: 650.0000 mg | Freq: Four times a day (QID) | RECTAL | Status: DC | PRN

## 2024-04-01 MED ORDER — INSULIN ASPART 100 UNIT/ML IJ SOLN
0.0000 [IU] | INTRAMUSCULAR | Status: DC
  Administered 2024-04-02: 3 [IU] via SUBCUTANEOUS
  Administered 2024-04-02: 5 [IU] via SUBCUTANEOUS
  Administered 2024-04-02 (×3): 3 [IU] via SUBCUTANEOUS
  Administered 2024-04-02: 5 [IU] via SUBCUTANEOUS
  Administered 2024-04-03: 7 [IU] via SUBCUTANEOUS
  Administered 2024-04-03: 2 [IU] via SUBCUTANEOUS
  Administered 2024-04-03: 5 [IU] via SUBCUTANEOUS
  Administered 2024-04-03: 2 [IU] via SUBCUTANEOUS
  Administered 2024-04-03: 5 [IU] via SUBCUTANEOUS

## 2024-04-01 MED ORDER — ACYCLOVIR 200 MG PO CAPS
800.0000 mg | ORAL_CAPSULE | Freq: Every day | ORAL | Status: DC
Start: 2024-04-01 — End: 2024-04-02
  Filled 2024-04-01 (×4): qty 4

## 2024-04-01 MED ORDER — SODIUM CHLORIDE 0.9 % IV SOLN
1.0000 g | Freq: Three times a day (TID) | INTRAVENOUS | Status: DC
  Administered 2024-04-02: 1 g via INTRAVENOUS
  Filled 2024-04-01 (×6): qty 5

## 2024-04-01 MED ORDER — DEXTROSE IN LACTATED RINGERS 5 % IV SOLN: INTRAVENOUS | Status: AC

## 2024-04-01 MED ORDER — ASPIRIN 81 MG PO TBEC
81.0000 mg | DELAYED_RELEASE_TABLET | Freq: Every day | ORAL | Status: DC
  Administered 2024-04-03 – 2024-04-04 (×2): 81 mg via ORAL
  Filled 2024-04-01 (×2): qty 1

## 2024-04-01 MED ORDER — INSULIN GLARGINE-YFGN 100 UNIT/ML ~~LOC~~ SOLN
10.0000 [IU] | Freq: Every day | SUBCUTANEOUS | Status: DC
  Administered 2024-04-02 – 2024-04-04 (×3): 10 [IU] via SUBCUTANEOUS
  Filled 2024-04-01 (×3): qty 0.1

## 2024-04-01 MED ORDER — METRONIDAZOLE 500 MG/100ML IV SOLN
500.0000 mg | Freq: Two times a day (BID) | INTRAVENOUS | Status: DC
  Administered 2024-04-02 (×2): 500 mg via INTRAVENOUS
  Filled 2024-04-01 (×2): qty 100

## 2024-04-01 MED ORDER — ENOXAPARIN SODIUM 40 MG/0.4ML IJ SOSY
40.0000 mg | PREFILLED_SYRINGE | INTRAMUSCULAR | Status: DC
  Administered 2024-04-02: 40 mg via SUBCUTANEOUS
  Filled 2024-04-01: qty 0.4

## 2024-04-01 MED ORDER — POLYETHYLENE GLYCOL 3350 17 G PO PACK: 17.0000 g | PACK | Freq: Every day | ORAL | Status: DC | PRN

## 2024-04-01 MED ORDER — SODIUM CHLORIDE 0.9% FLUSH
3.0000 mL | Freq: Two times a day (BID) | INTRAVENOUS | Status: DC
  Administered 2024-04-02 – 2024-04-04 (×6): 3 mL via INTRAVENOUS

## 2024-04-01 MED ORDER — SENNOSIDES-DOCUSATE SODIUM 8.6-50 MG PO TABS
2.0000 | ORAL_TABLET | Freq: Two times a day (BID) | ORAL | Status: DC
  Administered 2024-04-02 – 2024-04-04 (×4): 2 via ORAL
  Filled 2024-04-01 (×4): qty 2

## 2024-04-01 MED ORDER — THIAMINE HCL 100 MG/ML IJ SOLN
100.0000 mg | Freq: Every day | INTRAMUSCULAR | Status: DC
  Administered 2024-04-02 – 2024-04-04 (×4): 100 mg via INTRAVENOUS
  Filled 2024-04-01 (×4): qty 2

## 2024-04-01 MED ORDER — OXYCODONE HCL 5 MG PO TABS
5.0000 mg | ORAL_TABLET | ORAL | Status: DC | PRN
  Administered 2024-04-02 – 2024-04-04 (×6): 5 mg via ORAL
  Filled 2024-04-01 (×5): qty 1

## 2024-04-01 MED ORDER — ACETAMINOPHEN 325 MG PO TABS
650.0000 mg | ORAL_TABLET | Freq: Four times a day (QID) | ORAL | Status: DC | PRN
  Administered 2024-04-04 (×2): 650 mg via ORAL
  Filled 2024-04-01 (×2): qty 2

## 2024-04-01 MED ORDER — FENTANYL CITRATE PF 50 MCG/ML IJ SOSY
50.0000 ug | PREFILLED_SYRINGE | Freq: Once | INTRAMUSCULAR | Status: AC
  Administered 2024-04-01: 50 ug via INTRAVENOUS
  Filled 2024-04-01: qty 1

## 2024-04-01 MED ORDER — MEGESTROL ACETATE 20 MG PO TABS
20.0000 mg | ORAL_TABLET | Freq: Two times a day (BID) | ORAL | Status: DC
  Administered 2024-04-02 – 2024-04-04 (×4): 20 mg via ORAL
  Filled 2024-04-01 (×8): qty 1

## 2024-04-01 NOTE — Assessment & Plan Note (Signed)
 See HPI, initially required 100 FiO2, now down to 2 L/min.  Likely due to aspiration pneumonia.  Will treat with aztreonam plus metronidazole.

## 2024-04-01 NOTE — H&P (Addendum)
 History and Physical    Patient: Danielle Clarke:[id number] DOB: [birth year] DOA: 04/01/2024 DOS: the patient was seen and examined on 04/01/2024 PCP: Elester Grim, MD  Patient coming from: Home  Chief Complaint:  Chief Complaint  Patient presents with   AMS   SOB   HPI: Danielle Clarke is a 83 y.o. female with medical history significant of medical issues as listed below.  Patient generally is able to ambulate a little bit at home.  Daughter is the principal historian for this encounter although patient is listening in.  Daughter describes that the patient has had chronic trouble swallowing with increasing episodes of choking over the last several weeks.  Patient was eating one of her medication pills earlier this afternoon when patient was noted to be coughing by daughter.  When daughter returned back, patient was not breathing and had lost consciousness this lasted for about 10 minutes or so before patient slowly regained consciousness as noted and was noted to have rapid breathing afterward.  Patient does not have any further details regarding this.  Patient denies any chest pain palpitation any presyncopal or premonitory symptoms.  EMS was called and per report patient was actually SpO2 only 60% on 100% nonrebreather.  Since then patient has been slowly weaned off of oxygen and is down to 2 L/min which the ER doc was unable to wean.  Medical evaluation is sought.  No report of fever leg swelling vomiting diarrhea etc.  Of note patient does have shingles/varicella zoster of the right hemithorax that has been going on for about 2 weeks.  Patient is on medication for same.  Further patient has been complaining of upper back pain especially when there is bumps in car rides for the last 1 week severe.  No lower extremity weakness no bladder bowel incontinence.  Family also reports that the patient's diet has been extremely poor for a prolonged period of time.  Patient failed swallow  evaluation in the ER mostly due to poor effort/interest. Review of Systems: As mentioned in the history of present illness. All other systems reviewed and are negative. Past Medical History:  Diagnosis Date   Aortic stenosis, mild    Arthritis    Asthmatic bronchitis    with colds per patient   Carotid artery disease (HCC)    40-59% bilateral ICA stenosis   Cataract    Cutaneous abscess of right foot    Glaucoma    Heart murmur    Dr Felipe Horton is her  cardiologist.    Hyperlipemia    Hypertension    Dr. 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 Lorie Rook and Dr. Honey Lusty   Syncope    Type 1 diabetes mellitus [facility])    Past Surgical History:  Procedure Laterality Date   ANKLE FRACTURE SURGERY Left 2001   steel plate and 3 screws    ANTERIOR CERVICAL CORPECTOMY N/A 07/31/2014   Procedure: Cervical Four to Cervical Six Corpectomy;  Surgeon: Adelbert Adler, MD;  Location: [facility] NEURO ORS;  Service: Neurosurgery;  Laterality: N/A;  C4 to C6 Corpectomy   BREAST SURGERY  1988   CATARACT EXTRACTION W/ INTRAOCULAR LENS  IMPLANT, BILATERAL Bilateral 2011   right and then left   EYE SURGERY     FRACTURE SURGERY     HAMMER TOE  SURGERY  1998   I & D EXTREMITY Right 09/27/2018   Procedure: IRRIGATION AND DEBRIDEMENT RIGHT FOOT;  Surgeon: Timothy Ford, MD;  Location: [facility] OR;  Service: Orthopedics;  Laterality: Right;   INTRAOPERATIVE TRANSTHORACIC ECHOCARDIOGRAM N/A 02/06/2023   Procedure: INTRAOPERATIVE TRANSTHORACIC ECHOCARDIOGRAM;  Surgeon: Kyra Phy, MD;  Location: [facility] OR;  Service: Open Heart Surgery;  Laterality: N/A;   JOINT REPLACEMENT     PACEMAKER IMPLANT N/A 02/10/2019   Procedure: PACEMAKER IMPLANT;  Surgeon: Tammie Fall, MD;  Location: [facility] INVASIVE CV LAB;  Service: Cardiovascular;  Laterality: N/A;   RIGHT/LEFT HEART CATH AND CORONARY ANGIOGRAPHY  N/A 01/11/2023   Procedure: RIGHT/LEFT HEART CATH AND CORONARY ANGIOGRAPHY;  Surgeon: Kyra Phy, MD;  Location: [facility] INVASIVE CV LAB;  Service: Cardiovascular;  Laterality: N/A;   TONSILLECTOMY AND ADENOIDECTOMY  1948   TOTAL SHOULDER REPLACEMENT  2010   right shoulder    TRANSCATHETER AORTIC VALVE REPLACEMENT, TRANSFEMORAL N/A 02/06/2023   Procedure: Transcatheter Aortic Valve Replacement, Transfemoral;  Surgeon: Thukkani, Arun K, MD;  Location: [facility] OR;  Service: Open Heart Surgery;  Laterality: N/A;   TUBAL LIGATION  1979   TYMPANOSTOMY TUBE PLACEMENT Bilateral    Social History:  reports that she has never smoked. She has never used smokeless tobacco. She reports current alcohol use of about 2.0 standard drinks of alcohol per week. She reports that she does not use drugs.  Allergies  Allergen Reactions   Cefaclor Anaphylaxis    Tolerated cephalexin in 2018   Tetracycline Anaphylaxis   Vancomycin Anaphylaxis   Alendronate Sodium Other (See Comments)   Augmentin [Amoxicillin-Pot Clavulanate] Other (See Comments)    Reaction unknown >> Anaphylaxis Has patient had a PCN reaction causing immediate rash, facial/tongue/throat swelling, SOB or lightheadedness with hypotension: Unknown Has patient had a PCN reaction causing severe rash involving mucus membranes or skin necrosis: Unknown Has patient had a PCN reaction that required hospitalization: Unknown Has patient had a PCN reaction occurring within the last 10 years: Unknown If all of the above answers are "NO", then may proceed with Cephalosporin use.    Nsaids     CKD   Quinapril  Hcl     elevated potassium   Sulfamethoxazole -Trimethoprim      decreased kidney function   Prednisone Other (See Comments)    Reaction unknown    Family History  Problem Relation Age of Onset   Cancer Mother    Heart Problems Father     Prior to Admission medications   Medication Sig Start Date End Date Taking? Authorizing Provider  aspirin  EC  81 MG tablet Take 1 tablet (81 mg total) by mouth daily. Swallow whole. 12/14/22  Yes Thukkani, Arun K, MD  HUMALOG KWIKPEN 100 UNIT/ML KwikPen Inject 0-10 Units into the skin 3 (three) times daily as needed (high blood sugar). Sliding Scale 04/08/19  Yes [provider]  Insulin  Glargine (LANTUS ) 100 UNIT/ML Solostar Pen Inject 13 Units into the skin daily. Patient taking differently: Inject 10-12 Units into the skin in the morning. 02/28/19  Yes Angiulli, Everlyn Hockey, PA-C  megestrol (MEGACE) 20 MG tablet Take 20 mg by mouth 2 (two) times daily.   Yes [provider]  Menthol -Methyl Salicylate (SALONPAS PAIN RELIEF PATCH) PTCH Apply 1 patch topically daily as needed (patch).   Yes [provider]  methocarbamol (ROBAXIN) 750 MG tablet Take 750 mg by mouth 4 (four) times daily.   Yes [provider]  protein supplement shake (PREMIER PROTEIN) LIQD  Take 11 oz by mouth 2 (two) times daily. 30 mg of protein   Yes [provider]  simvastatin  (ZOCOR ) 40 MG tablet Take 40 mg by mouth at bedtime. 03/20/20  Yes [provider]  traMADol  (ULTRAM ) 50 MG tablet Take 50 mg by mouth every 6 (six) hours as needed.   Yes [provider]  valACYclovir (VALTREX) 1000 MG tablet Take 1,000 mg by mouth 2 (two) times daily.   Yes [provider]  azithromycin  (ZITHROMAX ) 500 MG tablet Take 1 tablet (500 mg total) by mouth as needed. Take one tablet by mouth one hour prior to dental cleanings. Patient not taking: Reported on 02/15/2024 02/15/23   Flynn Hylan, NP  Continuous Glucose Receiver (DEXCOM G7 RECEIVER) [facility] See admin instructions. Patient not taking: Reported on 02/15/2024 09/19/23   [provider]  Continuous Glucose Sensor (DEXCOM G7 SENSOR) MISC  10/14/23   [provider]  losartan  (COZAAR ) 25 MG tablet Take 0.5 tablets (12.5 mg total) by mouth at bedtime. Patient not taking: Reported on 02/15/2024 10/15/23   Thukkani, Arun  K, MD  metoprolol  tartrate (LOPRESSOR ) 25 MG tablet Take 1 tablet (25 mg total) by mouth as directed for 1 dose. Patient not taking: Reported on 04/01/2024 02/16/24   Ardia Kraft, [place]    Physical Exam: Vitals:   04/01/24 1830 04/01/24 1845 04/01/24 1900 04/01/24 1903  BP: (!) 112/57 (!) 105/52 103/78   Pulse: 94 87 88   Resp: (!) 21 (!) 21 17   Temp:    (!) 94.1 F (34.5 C)  TempSrc:    Oral  SpO2: 100% 100% 100%   Weight:      Height:       General: Patient is a Extremely fatigued.  However speaks in a weak voice and gives a reasonably coherent account of her symptoms. Respiratory exam: Extremely limited exam as patient was having upper back pain further there was tenderness along the rash on the right skin.  But felt to be vesicular Cardiovascular exam S1-S2 normal Abdomen all quadrants soft nontender Extremities warm without edema no focal motor deficit appreciated Focal tenderness along T11 and T12 on the back. Erythematous rash in a bandlike pattern in the right hemithorax.  However there is no active vesicles seen. Data Reviewed:  Labs on Admission:  Results for orders placed or performed during the hospital encounter of 04/01/24 (from the past 24 hours)  Comprehensive metabolic panel     Status: Abnormal   Collection Time: 04/01/24  2:51 PM  Result Value Ref Range   Sodium 130 (L) 135 - 145 mmol/L   Potassium 4.0 3.5 - 5.1 mmol/L   Chloride 91 (L) 98 - 111 mmol/L   CO2 26 22 - 32 mmol/L   Glucose, Bld 270 (H) 70 - 99 mg/dL   BUN 32 (H) 8 - 23 mg/dL   Creatinine, Ser 2.13 0.44 - 1.00 mg/dL   Calcium 8.8 (L) 8.9 - 10.3 mg/dL   Total Protein 7.0 6.5 - 8.1 g/dL   Albumin 2.4 (L) 3.5 - 5.0 g/dL   AST 19 15 - 41 U/L   ALT 13 0 - 44 U/L   Alkaline Phosphatase 101 38 - 126 U/L   Total Bilirubin 0.5 0.0 - 1.2 mg/dL   GFR, Estimated >08 >65 mL/min   Anion gap 13 5 - 15  CBC     Status: Abnormal   Collection Time: 04/01/24  2:51 PM  Result Value Ref Range  WBC  14.5 (H) 4.0 - 10.5 K/uL   RBC 4.88 3.87 - 5.11 MIL/uL   Hemoglobin 13.4 12.0 - 15.0 g/dL   HCT 78.2 95.6 - 21.3 %   MCV 83.4 80.0 - 100.0 fL   MCH 27.5 26.0 - 34.0 pg   MCHC 32.9 30.0 - 36.0 g/dL   RDW 08.6 57.8 - 46.9 %   Platelets 258 150 - 400 K/uL   nRBC 0.0 0.0 - 0.2 %  CBG monitoring, ED     Status: Abnormal   Collection Time: 04/01/24  3:02 PM  Result Value Ref Range   Glucose-Capillary 256 (H) 70 - 99 mg/dL  Lipase, blood     Status: None   Collection Time: 04/01/24  3:21 PM  Result Value Ref Range   Lipase 25 11 - 51 U/L  TSH     Status: None   Collection Time: 04/01/24  3:21 PM  Result Value Ref Range   TSH 3.035 0.350 - 4.500 uIU/mL  Magnesium      Status: None   Collection Time: 04/01/24  3:21 PM  Result Value Ref Range   Magnesium  1.9 1.7 - 2.4 mg/dL  Lactic acid, plasma     Status: None   Collection Time: 04/01/24  4:18 PM  Result Value Ref Range   Lactic Acid, Venous 1.4 0.5 - 1.9 mmol/L  Ammonia     Status: None   Collection Time: 04/01/24  4:21 PM  Result Value Ref Range   Ammonia 16 9 - 35 umol/L  Resp panel by RT-PCR (RSV, Flu A&B, Covid) Anterior Nasal Swab     Status: None   Collection Time: 04/01/24  4:33 PM   Specimen: Anterior Nasal Swab  Result Value Ref Range   SARS Coronavirus 2 by RT PCR NEGATIVE NEGATIVE   Influenza A by PCR NEGATIVE NEGATIVE   Influenza B by PCR NEGATIVE NEGATIVE   Resp Syncytial Virus by PCR NEGATIVE NEGATIVE   Basic Metabolic Panel: Recent Labs  Lab 04/01/24 1451 04/01/24 1521  NA 130*  --   K 4.0  --   CL 91*  --   CO2 26  --   GLUCOSE 270*  --   BUN 32*  --   CREATININE 0.78  --   CALCIUM 8.8*  --   MG  --  1.9   Liver Function Tests: Recent Labs  Lab 04/01/24 1451  AST 19  ALT 13  ALKPHOS 101  BILITOT 0.5  PROT 7.0  ALBUMIN 2.4*   Recent Labs  Lab 04/01/24 1521  LIPASE 25   Recent Labs  Lab 04/01/24 1621  AMMONIA 16   CBC: Recent Labs  Lab 04/01/24 1451  WBC 14.5*  HGB 13.4  HCT  40.7  MCV 83.4  PLT 258   Cardiac Enzymes: No results for input(s): "CKTOTAL", "CKMB", "CKMBINDEX", "TROPONINIHS" in the last 168 hours.  BNP (last 3 results) No results for input(s): "PROBNP" in the last 8760 hours. CBG: Recent Labs  Lab 04/01/24 1502  GLUCAP 256*    Radiological Exams on Admission:  DG Thoracic Spine 2 View Result Date: 04/01/2024 CLINICAL DATA:  Back pain EXAM: THORACIC SPINE 2 VIEWS; LUMBAR SPINE - 2-3 VIEW COMPARISON:  CT chest abdomen pelvis 01/18/2023 FINDINGS: Right convex thoracic and left convex lumbar curve. Demineralization and overlapping soft tissue limits assessment for subtle fractures. Compression fracture of T12 with 50% vertebral body height loss is new since 01/18/2023. Advanced lumbar spondylosis and facet arthropathy. IMPRESSION: Compression fracture of  T12 is age indeterminate but new since 01/18/2023. Electronically Signed   By: Rozell Cornet M.D.   On: 04/01/2024 20:29   DG Lumbar Spine 2-3 Views Result Date: 04/01/2024 CLINICAL DATA:  Back pain EXAM: THORACIC SPINE 2 VIEWS; LUMBAR SPINE - 2-3 VIEW COMPARISON:  CT chest abdomen pelvis 01/18/2023 FINDINGS: Right convex thoracic and left convex lumbar curve. Demineralization and overlapping soft tissue limits assessment for subtle fractures. Compression fracture of T12 with 50% vertebral body height loss is new since 01/18/2023. Advanced lumbar spondylosis and facet arthropathy. IMPRESSION: Compression fracture of T12 is age indeterminate but new since 01/18/2023. Electronically Signed   By: Rozell Cornet M.D.   On: 04/01/2024 20:29   DG Chest Portable 1 View Result Date: 04/01/2024 CLINICAL DATA:  hypoxia to 60's, possible aspiration, AMS. EXAM: PORTABLE CHEST 1 VIEW COMPARISON:  12/04/2023. FINDINGS: There are nonspecific opacities in the right para cardiac region, which may represent pneumonitis versus atelectasis and can be due to suspected aspiration. Bilateral lung fields are otherwise clear.  Bilateral costophrenic angles are clear. Stable cardio-mediastinal silhouette. Mitral annulus calcifications noted. 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 No acute osseous abnormalities. Subacute/healing right posterior rib fractures noted. Right shoulder arthroplasty noted. There is cervicothoracic spinal fixation hardware. The soft tissues are within normal limits. IMPRESSION: *Nonspecific opacities in the right para cardiac region, which may represent pneumonitis versus atelectasis and can be due to suspected aspiration. Electronically Signed   By: Beula Brunswick M.D.   On: 04/01/2024 16:23   CT HEAD WO CONTRAST ( ) Result Date: 04/01/2024 CLINICAL DATA:  Altered mental status. EXAM: CT HEAD WITHOUT CONTRAST TECHNIQUE: Contiguous axial images were obtained from the base of the skull through the vertex without intravenous contrast. RADIATION DOSE REDUCTION: This exam was performed according to the departmental dose-optimization program which includes automated exposure control, adjustment of the mA and/or kV according to patient size and/or use of iterative reconstruction technique. COMPARISON:  February 10, 2024 FINDINGS: Brain: There is generalized cerebral atrophy with widening of the extra-axial spaces and ventricular dilatation. There are areas of decreased attenuation within the white matter tracts of the supratentorial brain, consistent with microvascular disease changes. Stable areas of bilateral anterior temporal lobe and left frontal lobe encephalomalacia are noted. Vascular: Marked severity bilateral cavernous carotid artery calcification is seen. Skull: Normal. Negative for fracture or focal lesion. Sinuses/Orbits: There is marked severity left maxillary sinus, left-sided sphenoid sinus and right-sided frontal sinus mucosal thickening. Mild bilateral maxillary sinus mucosal thickening is also noted. Other: None. IMPRESSION: 1. Generalized cerebral atrophy with widening  of the extra-axial spaces and ventricular dilatation. 2. Stable areas of bilateral anterior temporal lobe and left frontal lobe encephalomalacia. 3. Marked severity left maxillary sinus, left-sided sphenoid sinus and right-sided frontal sinus mucosal thickening. Electronically Signed   By: Virgle Grime M.D.   On: 04/01/2024 16:04    chest X-ray  EKG: Independently reviewed. ST segment elevation in Anterior leads. Trop pending.  No intake/output data recorded. No intake/output data recorded.      Assessment and Plan: * Loss of consciousness (HCC) Given the direct observer reports pain episode of choking and altered breathing proceeding patient has loss of consciousness, this is felt to be hypoxic respiratory failure related event.  However we will maintain the patient on telemetry given that the patient's EKG is showing some ST segment changes, I have discussed the case with Dr. Hayes Lipps of cardiology who will evaluate the patient.  He will interrogate  the pacemaker in the morning and check a troponin.  I will also monitor patient's glucose.  Shingles Present prior to admisiosn. No active vesicualr at this time. Azyclovir per phamrcy iv till swallow eval completed.  Compression fracture of T12 vertebra (HCC) This is felt to be acute and symptomatic.  Maintain on bedrest till neurosurgery evaluation in the morning.  I did discuss with neurosurgery but overall plan will be TLSO brace with out of bed  Acute hypoxic respiratory failure (HCC) See HPI, initially required 100 FiO2, now down to 2 L/min.  Likely due to aspiration pneumonia.  Will treat with aztreonam plus metronidazole.  Choking To some extent this is chronic, daughter reports fluids are never an issue. patient failed swallow evaluation in the ER.  Will request speech and swallow evaluation in the morning as well as nutrition eval as patient suspected to be chronically malnourished.  Empiric thiamine.  Check phosphate. Crush  all meds.  Hyperlipidemia Continue with chronic aspirin   Essential hypertension, benign This is chronically documented, however currently patient's diastolic blood pressure is less than 60.  Check cortisol in AM.  Hold all antihypertensive agents.  Start the patient on D5 LR infusion tonight.  Monitor.  Diabetes mellitus type 1 (HCC) This is chronic, continue with reduced dose of 10 units Lantus  every morning, insulin  sliding scale every 4 hourly ordered while patient is not on a diet pending her swallow evaluation.   DVT ppx - lovenox .    Advance Care Planning:   Code Status: Full Code per duaghter at bedside.  Consults: cardiology, NES  Family Communication: daughter at bedside. Discussed.  Severity of Illness: The appropriate patient status for this patient is OBSERVATION. Observation status is judged to be reasonable and necessary in order to provide the required intensity of service to ensure the patient's safety. The patient's presenting symptoms, physical exam findings, and initial radiographic and laboratory data in the context of their medical condition is felt to place them at decreased risk for further clinical deterioration. Furthermore, it is anticipated that the patient will be medically stable for discharge from the hospital within 2 midnights of admission.   Author: Bennie Brave, MD 04/01/2024 9:19 PM  For on call review [email or web address].

## 2024-04-01 NOTE — Assessment & Plan Note (Addendum)
 To some extent this is chronic, daughter reports fluids are never an issue. patient failed swallow evaluation in the ER.  Will request speech and swallow evaluation in the morning as well as nutrition eval as patient suspected to be chronically malnourished.  Empiric thiamine.  Check phosphate. Crush all meds.

## 2024-04-01 NOTE — Consult Note (Signed)
 Cardiology Consultation   Patient ID: ZALIYA PEEPLES MRN: [id number]; DOB: Feb 19, 1941  Admit date: 04/01/2024 Date of Consult: 04/01/2024  PCP:  Elester Grim, MD   [facility] Providers Cardiologist:  Arun K Thukkani, MD        Patient Profile:   MARLYN ARAGONES is a 83 y.o. female with a hx of bradycardia s/p PPM placement 2020, ICH secondary to fall, IDDM, HLD, and severe aortic stenosis s/p TAVR (02/06/23) who is being seen 04/01/2024 for the evaluation of possible syncope and pacemaker concern at the request of Dr. Cleda Curly.  History of Present Illness:   History is obtained mainly through documentation review and discussing with other providers who have cared for her earlier in the evening.  She was unable to get much of a history at all.  Recently, she has been suffering from right sided chest shingles and has been on pain medications and muscle relaxant.  She has had worsened dysphagia especially with pills recently.  She was having trouble taking her pills this evening when her family found her unresponsive on the ground.  She was noted to have abnormal breathing.  No CPR was performed.  EMS was called and she was noted to have an SPO2 of 60% on room air.  She was placed on a nonrebreather.  On arrival to the emergency room, she was able to be weaned relatively quickly to 2 L per nasal cannula.  She tells me that she is not having any current chest discomfort or shortness of breath.  She does not recall any of the events from earlier in the day.  She is unsure if she has had any recent tachypalpitations, chest pain, trouble breathing, lower extremity edema, abdominal pain, nausea, vomiting.  In the ED, afebrile, HR 80s-90s, BP 100-150/60-70s, no longer requiring supplemental oxygen.  CBC with mild leukocytosis to 14.5, otherwise unremarkable.  CMP with NA 130, Cr 0.78.  Lactate, lipase, TSH all normal.  Respiratory panel negative.  CT head with no acute intracranial  abnormalities.  X-ray of thoracic/lumbar spine with compression fracture of T12 which is age-indeterminate but new since 01/18/2023.  CT chest findings of possible aspiration.   Past Medical History:  Diagnosis Date   Aortic stenosis, mild    Arthritis    Asthmatic bronchitis    with colds per patient   Carotid artery disease (HCC)    40-59% bilateral ICA stenosis   Cataract    Cutaneous abscess of right foot    Glaucoma    Heart murmur    Dr Felipe Horton is her  cardiologist.    Hyperlipemia    Hypertension    Dr. 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 Lorie Rook and Dr. Honey Lusty   Syncope    Type 1 diabetes mellitus [facility])     Past Surgical History:  Procedure Laterality Date   ANKLE FRACTURE SURGERY Left 2001   steel plate and 3 screws    ANTERIOR CERVICAL CORPECTOMY N/A 07/31/2014   Procedure: Cervical Four to Cervical Six Corpectomy;  Surgeon: Adelbert Adler, MD;  Location: [facility];  Service: Neurosurgery;  Laterality: N/A;  C4 to C6 Corpectomy   BREAST SURGERY  1988   CATARACT EXTRACTION W/ INTRAOCULAR LENS  IMPLANT, BILATERAL Bilateral 2011   right and then left  EYE SURGERY     FRACTURE SURGERY     HAMMER TOE SURGERY  1998   I & D EXTREMITY Right 09/27/2018   Procedure: IRRIGATION AND DEBRIDEMENT RIGHT FOOT;  Surgeon: Timothy Ford, MD;  Location: [facility] OR;  Service: Orthopedics;  Laterality: Right;   INTRAOPERATIVE TRANSTHORACIC ECHOCARDIOGRAM N/A 02/06/2023   Procedure: INTRAOPERATIVE TRANSTHORACIC ECHOCARDIOGRAM;  Surgeon: Kyra Phy, MD;  Location: [facility] OR;  Service: Open Heart Surgery;  Laterality: N/A;   JOINT REPLACEMENT     PACEMAKER IMPLANT N/A 02/10/2019   Procedure: PACEMAKER IMPLANT;  Surgeon: Tammie Fall, MD;  Location: [facility] CV LAB;  Service: Cardiovascular;  Laterality: N/A;   RIGHT/LEFT HEART CATH AND CORONARY  ANGIOGRAPHY N/A 01/11/2023   Procedure: RIGHT/LEFT HEART CATH AND CORONARY ANGIOGRAPHY;  Surgeon: Kyra Phy, MD;  Location: [facility]VAS[facility] CV LAB;  Service: Cardiovascular;  Laterality: N/A;   TONSILLECTOMY AND ADENOIDECTOMY  1948   TOTAL SHOULDER REPLACEMENT  2010   right shoulder    TRANSCATHETER AORTIC VALVE REPLACEMENT, TRANSFEMORAL N/A 02/06/2023   Procedure: Transcatheter Aortic Valve Replacement, Transfemoral;  Surgeon: Thukkani, Arun K, MD;  Location: [facility] OR;  Service: Open Heart Surgery;  Laterality: N/A;   TUBAL LIGATION  1979   TYMPANOSTOMY TUBE PLACEMENT Bilateral      Home Medications:  Prior to Admission medications   Medication Sig Start Date End Date Taking? Authorizing Provider  aspirin  EC 81 MG tablet Take 1 tablet (81 mg total) by mouth daily. Swallow whole. 12/14/22  Yes Thukkani, Arun K, MD  HUMALOG KWIKPEN 100 UNIT/ML KwikPen Inject 0-10 Units into the skin 3 (three) times daily as needed (high blood sugar). Sliding Scale 04/08/19  Yes [provider]  Insulin  Glargine (LANTUS ) 100 UNIT/ML Solostar Pen Inject 13 Units into the skin daily. Patient taking differently: Inject 10-12 Units into the skin in the morning. 02/28/19  Yes Angiulli, Everlyn Hockey, PA-C  megestrol (MEGACE) 20 MG tablet Take 20 mg by mouth 2 (two) times daily.   Yes [provider]  Menthol -Methyl Salicylate (SALONPAS PAIN RELIEF PATCH) PTCH Apply 1 patch topically daily as needed (patch).   Yes [provider]  methocarbamol (ROBAXIN) 750 MG tablet Take 750 mg by mouth 4 (four) times daily.   Yes [provider]  protein supplement shake (PREMIER PROTEIN) LIQD Take 11 oz by mouth 2 (two) times daily. 30 mg of protein   Yes [provider]  simvastatin  (ZOCOR ) 40 MG tablet Take 40 mg by mouth at bedtime. 03/20/20  Yes [provider]  traMADol  (ULTRAM ) 50 MG tablet Take 50 mg by mouth every 6 (six) hours as needed.   Yes [provider]   valACYclovir (VALTREX) 1000 MG tablet Take 1,000 mg by mouth 2 (two) times daily.   Yes [provider]  azithromycin  (ZITHROMAX ) 500 MG tablet Take 1 tablet (500 mg total) by mouth as needed. Take one tablet by mouth one hour prior to dental cleanings. Patient not taking: Reported on 02/15/2024 02/15/23   Flynn Hylan, NP  Continuous Glucose Receiver (DEXCOM G7 RECEIVER) [facility] See admin instructions. Patient not taking: Reported on 02/15/2024 09/19/23   [provider]  Continuous Glucose Sensor (DEXCOM G7 SENSOR) MISC  10/14/23   [provider]  losartan  (COZAAR ) 25 MG tablet Take 0.5 tablets (12.5 mg total) by mouth at bedtime. Patient not taking: Reported on 02/15/2024 10/15/23   Thukkani, Arun K, MD  metoprolol  tartrate (LOPRESSOR ) 25 MG tablet Take 1 tablet (  25 mg total) by mouth as directed for 1 dose. Patient not taking: Reported on 04/01/2024 02/16/24   Ardia Kraft, PA-C    Inpatient Medications: Scheduled Meds:  acyclovir  800 mg Oral 5 X Daily   aspirin  EC  81 mg Oral Daily   [START ON 04/02/2024] enoxaparin  (LOVENOX ) injection  40 mg Subcutaneous Q24H   insulin  aspart  0-9 Units Subcutaneous Q4H   [START ON 04/02/2024] insulin  glargine-yfgn  10 Units Subcutaneous Daily   megestrol  20 mg Oral BID   senna-docusate  2 tablet Oral BID   sodium chloride  flush  3 mL Intravenous Q12H   thiamine (VITAMIN B1) injection  100 mg Intravenous Daily   Continuous Infusions:  aztreonam     dextrose  5% lactated ringers      metronidazole     PRN Meds: acetaminophen  **OR** acetaminophen , oxyCODONE , polyethylene glycol  Allergies:    Allergies  Allergen Reactions   Cefaclor Anaphylaxis    Tolerated cephalexin in 2018   Tetracycline Anaphylaxis   Vancomycin Anaphylaxis   Alendronate Sodium Other (See Comments)   Augmentin [Amoxicillin-Pot Clavulanate] Other (See Comments)    Reaction unknown >> Anaphylaxis Has patient had a PCN reaction causing  immediate rash, facial/tongue/throat swelling, SOB or lightheadedness with hypotension: Unknown Has patient had a PCN reaction causing severe rash involving mucus membranes or skin necrosis: Unknown Has patient had a PCN reaction that required hospitalization: Unknown Has patient had a PCN reaction occurring within the last 10 years: Unknown If all of the above answers are "NO", then may proceed with Cephalosporin use.    Nsaids     CKD   Quinapril  Hcl     elevated potassium   Sulfamethoxazole -Trimethoprim      decreased kidney function   Prednisone Other (See Comments)    Reaction unknown    Social History:   Social History   Socioeconomic History   Marital status: Divorced    Spouse name: Not on file   Number of children: 2   Years of education: [employer]   Highest education level: Not on file  Occupational History   Occupation: Retired    Associate Professor: RETIRED  Tobacco Use   Smoking status: Never   Smokeless tobacco: Never  Vaping Use   Vaping status: Never Used  Substance and Sexual Activity   Alcohol use: Yes    Alcohol/week: 2.0 standard drinks of alcohol    Types: 2 Glasses of wine per week   Drug use: Never   Sexual activity: Never  Other Topics Concern   Not on file  Social History Narrative   Patient lives at home alone.   Caffeine Use:    Social Drivers of Corporate investment banker Strain: Not on file  Food Insecurity: Not on file  Transportation Needs: Not on file  Physical Activity: Not on file  Stress: Not on file  Social Connections: Not on file  Intimate Partner Violence: Not on file    Family History:    Family History  Problem Relation Age of Onset   Cancer Mother    Heart Problems Father      ROS:  Please see the history of present illness.   All other ROS reviewed and negative.     Physical Exam/Data:   Vitals:   04/01/24 1903 04/01/24 2143 04/01/24 2200 04/01/24 2300  BP:   (!) 109/49 115/69  Pulse:   88 87  Resp:   16 13   Temp: (!) 94.1 F (34.5 C)  98.3 F (36.8 C)  TempSrc: Oral   Oral  SpO2:  99% 100% 100%  Weight:      Height:       No intake or output data in the 24 hours ending 04/01/24 2340    04/01/2024    2:45 PM 02/15/2024   11:02 AM 02/10/2024   10:33 PM  Last 3 Weights  Weight (lbs) 89 lb 99 lb 12.8 oz 101 lb 13.6 oz   Weight (kg) 40.37 kg 45.269 kg 46.2 kg      Significant value     Body mass index is 17.38 kg/m.  General: Frail-appearing HEENT: normal Neck: no JVD Vascular: No carotid bruits; Distal pulses 2+ bilaterally Cardiac:  normal S1, S2; RRR; 3/6 systolic ejection murmur Lungs: Coarse breath sounds anteriorly Abd: soft, nontender, no hepatomegaly  Ext: no edema Musculoskeletal:  No deformities Skin: Shingles rash across the right chest Neuro: No focal abnormalities Psych:  Normal affect   EKG:  The EKG was personally reviewed and demonstrates: Normal sinus rhythm with early repolarization Telemetry:  Telemetry was personally reviewed and demonstrates: Sinus rhythm  Relevant CV Studies: 1. Left ventricular ejection fraction, by estimation, is 50 to 55%. The  left ventricle has low normal function. The left ventricle has no regional  wall motion abnormalities. Left ventricular diastolic parameters are  consistent with Grade I diastolic  dysfunction (impaired relaxation). The average left ventricular global  longitudinal strain is -19.2 %. The global longitudinal strain is normal.   2. Right ventricular systolic function is normal. The right ventricular  size is normal. There is mildly elevated pulmonary artery systolic  pressure. The estimated right ventricular systolic pressure is 42.4 mmHg.   3. The mitral valve is normal in structure. Mild mitral valve  regurgitation. No evidence of mitral stenosis. Severe mitral annular  calcification.   4. Tricuspid valve regurgitation is moderate.   5. DI 0.32. The aortic valve has been repaired/replaced. Aortic valve   regurgitation is not visualized. No aortic stenosis is present. There is a  20 mm Sapien prosthetic (TAVR) valve present in the aortic position. Echo  findings are consistent with  increased gradient of the aortic prosthesis. Aortic valve area, by VTI  measures 1.33 cm. Aortic valve mean gradient measures 26.0 mmHg. Aortic  valve Vmax measures 3.21 m/s.   6. The inferior vena cava is normal in size with greater than 50%  respiratory variability, suggesting right atrial pressure of 3 mmHg.   Comparison(s): Prior 03/16/23 - TAVR mean gradient was 13 mmHg. Now 26  mmHg. 11/2022 ECHO prior to TAVR with mean gradient 36 mmHg. Consider CTA  to evaluate for HALT (hypoattenuated leaflet thickening).   Laboratory Data:  High Sensitivity Troponin:  No results for input(s): "TROPONINIHS" in the last 720 hours.   Chemistry Recent Labs  Lab 04/01/24 1451 04/01/24 1521  NA 130*  --   K 4.0  --   CL 91*  --   CO2 26  --   GLUCOSE 270*  --   BUN 32*  --   CREATININE 0.78  --   CALCIUM 8.8*  --   MG  --  1.9  GFRNONAA >60  --   ANIONGAP 13  --     Recent Labs  Lab 04/01/24 1451  PROT 7.0  ALBUMIN 2.4*  AST 19  ALT 13  ALKPHOS 101  BILITOT 0.5   Lipids No results for input(s): "CHOL", "TRIG", "HDL", "LABVLDL", "LDLCALC", "CHOLHDL" in the last 168 hours.  Hematology Recent Labs  Lab 04/01/24 1451  WBC 14.5*  RBC 4.88  HGB 13.4  HCT 40.7  MCV 83.4  MCH 27.5  MCHC 32.9  RDW 14.1  PLT 258   Thyroid   Recent Labs  Lab 04/01/24 1521  TSH 3.035    BNPNo results for input(s): "BNP", "PROBNP" in the last 168 hours.  DDimer No results for input(s): "DDIMER" in the last 168 hours.   Radiology/Studies:  DG Thoracic Spine 2 View Result Date: 04/01/2024 CLINICAL DATA:  Back pain EXAM: THORACIC SPINE 2 VIEWS; LUMBAR SPINE - 2-3 VIEW COMPARISON:  CT chest abdomen pelvis 01/18/2023 FINDINGS: Right convex thoracic and left convex lumbar curve. Demineralization and overlapping soft  tissue limits assessment for subtle fractures. Compression fracture of T12 with 50% vertebral body height loss is new since 01/18/2023. Advanced lumbar spondylosis and facet arthropathy. IMPRESSION: Compression fracture of T12 is age indeterminate but new since 01/18/2023. Electronically Signed   By: Rozell Cornet M.D.   On: 04/01/2024 20:29   DG Lumbar Spine 2-3 Views Result Date: 04/01/2024 CLINICAL DATA:  Back pain EXAM: THORACIC SPINE 2 VIEWS; LUMBAR SPINE - 2-3 VIEW COMPARISON:  CT chest abdomen pelvis 01/18/2023 FINDINGS: Right convex thoracic and left convex lumbar curve. Demineralization and overlapping soft tissue limits assessment for subtle fractures. Compression fracture of T12 with 50% vertebral body height loss is new since 01/18/2023. Advanced lumbar spondylosis and facet arthropathy. IMPRESSION: Compression fracture of T12 is age indeterminate but new since 01/18/2023. Electronically Signed   By: Rozell Cornet M.D.   On: 04/01/2024 20:29   DG Chest Portable 1 View Result Date: 04/01/2024 CLINICAL DATA:  hypoxia to 60's, possible aspiration, AMS. EXAM: PORTABLE CHEST 1 VIEW COMPARISON:  12/04/2023. FINDINGS: There are nonspecific opacities in the right para cardiac region, which may represent pneumonitis versus atelectasis and can be due to suspected aspiration. Bilateral lung fields are otherwise clear. Bilateral costophrenic angles are clear. Stable cardio-mediastinal silhouette. Mitral annulus calcifications noted. 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 No acute osseous abnormalities. Subacute/healing right posterior rib fractures noted. Right shoulder arthroplasty noted. There is cervicothoracic spinal fixation hardware. The soft tissues are within normal limits. IMPRESSION: *Nonspecific opacities in the right para cardiac region, which may represent pneumonitis versus atelectasis and can be due to suspected aspiration. Electronically Signed   By: Beula Brunswick M.D.   On: 04/01/2024 16:23   CT HEAD WO CONTRAST ( ) Result Date: 04/01/2024 CLINICAL DATA:  Altered mental status. EXAM: CT HEAD WITHOUT CONTRAST TECHNIQUE: Contiguous axial images were obtained from the base of the skull through the vertex without intravenous contrast. RADIATION DOSE REDUCTION: This exam was performed according to the departmental dose-optimization program which includes automated exposure control, adjustment of the mA and/or kV according to patient size and/or use of iterative reconstruction technique. COMPARISON:  February 10, 2024 FINDINGS: Brain: There is generalized cerebral atrophy with widening of the extra-axial spaces and ventricular dilatation. There are areas of decreased attenuation within the white matter tracts of the supratentorial brain, consistent with microvascular disease changes. Stable areas of bilateral anterior temporal lobe and left frontal lobe encephalomalacia are noted. Vascular: Marked severity bilateral cavernous carotid artery calcification is seen. Skull: Normal. Negative for fracture or focal lesion. Sinuses/Orbits: There is marked severity left maxillary sinus, left-sided sphenoid sinus and right-sided frontal sinus mucosal thickening. Mild bilateral maxillary sinus mucosal thickening is also noted. Other: None. IMPRESSION: 1. Generalized cerebral atrophy with widening of the extra-axial spaces and  ventricular dilatation. 2. Stable areas of bilateral anterior temporal lobe and left frontal lobe encephalomalacia. 3. Marked severity left maxillary sinus, left-sided sphenoid sinus and right-sided frontal sinus mucosal thickening. Electronically Signed   By: Virgle Grime M.D.   On: 04/01/2024 16:04     Assessment and Plan:   Acute hypoxic respiratory failure Severe AS s/p TAVR Symptomatic bradycardia s/p PPM insertion (previous serious fall due to sinus arrest) Presented following a choking episode and likely loss of consciousness with acute  hypoxic respiratory failure.  Likely secondary to aspiration event.  Had days of preceding worsening dysphagia.  PPM was interrogated at bedside.  Logbook with no new arrhythmias.  AS/VS at time of interrogation.  No signs or symptoms of acute heart failure.  Presentation and workup not consistent with ACS.  EKG with ST elevation related to early repolarization has been seen on past EKGs as well. - Continue home aspirin , losartan , metoprolol  tartrate, simvastatin  - Does not need repeat EKG at this time - No further inpatient workup currently   Risk Assessment/Risk Scores:            [facility] will sign off.   Medication Recommendations:  none Other recommendations (labs, testing, etc):  none Follow up as an outpatient:  as previously established  For questions or updates, please contact [facility] Please consult [email or web address] for contact info under    Signed, Chandler Combs, MD  04/01/2024 11:40 PM

## 2024-04-01 NOTE — ED Provider Notes (Signed)
 [facility] AT [facility] Provider Note   CSN: [id number] Arrival date & time: 04/01/24  1421     History {Add pertinent medical, surgical, social history, OB history to HPI:1} Chief Complaint  Patient presents with  . AMS  . SOB    Danielle Clarke is a 83 y.o. female.  HPI     Home Medications Prior to Admission medications   Medication Sig Start Date End Date Taking? Authorizing Provider  aspirin  EC 81 MG tablet Take 1 tablet (81 mg total) by mouth daily. Swallow whole. 12/14/22   Thukkani, Arun K, MD  azithromycin  (ZITHROMAX ) 500 MG tablet Take 1 tablet (500 mg total) by mouth as needed. Take one tablet by mouth one hour prior to dental cleanings. Patient not taking: Reported on 02/15/2024 02/15/23   McDaniel, Jill D, NP  Cholecalciferol  (VITAMIN D ) 50 MCG (2000 UT) tablet Take 2,000 Units by mouth daily. Patient not taking: Reported on 02/15/2024    [provider]  Continuous Glucose Receiver (DEXCOM G7 RECEIVER) DEVI See admin instructions. Patient not taking: Reported on 02/15/2024 09/19/23   [provider]  Continuous Glucose Sensor (DEXCOM G7 SENSOR) MISC  10/14/23   [provider]  Cyanocobalamin (B-12) 5000 MCG CAPS Take 5,000 mcg by mouth daily. Patient not taking: Reported on 02/15/2024    [provider]  ferrous sulfate 325 (65 FE) MG tablet Take 325 mg by mouth daily with breakfast. Patient not taking: Reported on 02/15/2024    [provider]  HUMALOG KWIKPEN 100 UNIT/ML KwikPen Inject 0-10 Units into the skin 3 (three) times daily as needed (high blood sugar). Sliding Scale 04/08/19   [provider]  Insulin  Glargine (LANTUS ) 100 UNIT/ML Solostar Pen Inject 13 Units into the skin daily. Patient taking differently: Inject 10-12 Units into the skin in the morning. 02/28/19   Angiulli, Everlyn Hockey, PA-C  losartan  (COZAAR ) 25 MG tablet Take 0.5 tablets (12.5 mg total) by mouth at  bedtime. Patient not taking: Reported on 02/15/2024 10/15/23   Thukkani, Arun K, MD  Menthol -Methyl Salicylate (SALONPAS PAIN RELIEF PATCH) PTCH Apply 1 patch topically daily as needed (patch). Patient not taking: Reported on 02/15/2024    [provider]  metoprolol  tartrate (LOPRESSOR ) 25 MG tablet Take 1 tablet (25 mg total) by mouth as directed for 1 dose. 02/16/24   Ardia Kraft, PA-C  montelukast  (SINGULAIR ) 10 MG tablet Take 10 mg by mouth at bedtime. Patient not taking: Reported on 02/15/2024 06/27/22   [provider]  Polyethyl Glycol-Propyl Glycol (SYSTANE OP) Place 1 drop into both eyes daily as needed (dry eyes).    [provider]  protein supplement shake (PREMIER PROTEIN) LIQD Take 11 oz by mouth 2 (two) times daily. 30 mg of protein    [provider]  simvastatin  (ZOCOR ) 40 MG tablet Take 40 mg by mouth at bedtime. 03/20/20   [provider]      Allergies    Cefaclor, Tetracycline, Vancomycin, Alendronate sodium, Augmentin [amoxicillin-pot clavulanate], Nsaids, Quinapril  hcl, Sulfamethoxazole -trimethoprim , and Prednisone    Review of Systems   Review of Systems  Physical Exam Updated Vital Signs BP (!) 153/66   Pulse 98   Temp 97.6 F (36.4 C) (Temporal)   Resp 16   Ht 5' (1.524 m)   Wt 40.4 kg   SpO2 100%   BMI 17.38 kg/m  Physical Exam  ED Results / Procedures / Treatments   Labs (all labs ordered are  listed, but only abnormal results are displayed) Labs Reviewed  COMPREHENSIVE METABOLIC PANEL WITH GFR - Abnormal; Notable for the following components:      Result Value   Sodium 130 (*)    Chloride 91 (*)    Glucose, Bld 270 (*)    BUN 32 (*)    Calcium 8.8 (*)    Albumin 2.4 (*)    All other components within normal limits  CBC - Abnormal; Notable for the following components:   WBC 14.5 (*)    All other components within normal limits  HEMOGLOBIN A1C - Abnormal; Notable for the following components:    Hgb A1c MFr Bld 9.7 (*)    All other components within normal limits  CBG MONITORING, ED - Abnormal; Notable for the following components:   Glucose-Capillary 256 (*)    All other components within normal limits  RESP PANEL BY RT-PCR (RSV, FLU A&B, COVID)  RVPGX2  CULTURE, BLOOD (ROUTINE X 2)  CULTURE, BLOOD (ROUTINE X 2)  LACTIC ACID, PLASMA  LIPASE, BLOOD  TSH  MAGNESIUM   AMMONIA  PHOSPHORUS  URINALYSIS, ROUTINE W REFLEX MICROSCOPIC  LACTIC ACID, PLASMA  APTT  PROTIME-INR  BASIC METABOLIC PANEL WITH GFR  CBC  CORTISOL  TROPONIN I (HIGH SENSITIVITY)    EKG EKG Interpretation Date/Time:  Tuesday Apr 01 2024 14:51:44 EDT Ventricular Rate:  95 PR Interval:  124 QRS Duration:  107 QT Interval:  392 QTC Calculation: 493 R Axis:   4  Text Interpretation: Sinus rhythm when compared to prior, sharper t waves No STEMI Confirmed by Wynell Heath ([id number]) on 04/01/2024 6:25:18 PM  Radiology DG Thoracic Spine 2 View Result Date: 04/01/2024 CLINICAL DATA:  Back pain EXAM: THORACIC SPINE 2 VIEWS; LUMBAR SPINE - 2-3 VIEW COMPARISON:  CT chest abdomen pelvis 01/18/2023 FINDINGS: Right convex thoracic and left convex lumbar curve. Demineralization and overlapping soft tissue limits assessment for subtle fractures. Compression fracture of T12 with 50% vertebral body height loss is new since 01/18/2023. Advanced lumbar spondylosis and facet arthropathy. IMPRESSION: Compression fracture of T12 is age indeterminate but new since 01/18/2023. Electronically Signed   By: Rozell Cornet M.D.   On: 04/01/2024 20:29   DG Lumbar Spine 2-3 Views Result Date: 04/01/2024 CLINICAL DATA:  Back pain EXAM: THORACIC SPINE 2 VIEWS; LUMBAR SPINE - 2-3 VIEW COMPARISON:  CT chest abdomen pelvis 01/18/2023 FINDINGS: Right convex thoracic and left convex lumbar curve. Demineralization and overlapping soft tissue limits assessment for subtle fractures. Compression fracture of T12 with 50% vertebral body height loss is  new since 01/18/2023. Advanced lumbar spondylosis and facet arthropathy. IMPRESSION: Compression fracture of T12 is age indeterminate but new since 01/18/2023. Electronically Signed   By: Rozell Cornet M.D.   On: 04/01/2024 20:29   DG Chest Portable 1 View Result Date: 04/01/2024 CLINICAL DATA:  hypoxia to 60's, possible aspiration, AMS. EXAM: PORTABLE CHEST 1 VIEW COMPARISON:  12/04/2023. FINDINGS: There are nonspecific opacities in the right para cardiac region, which may represent pneumonitis versus atelectasis and can be due to suspected aspiration. Bilateral lung fields are otherwise clear. Bilateral costophrenic angles are clear. Stable cardio-mediastinal silhouette. Mitral annulus calcifications noted. 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 No acute osseous abnormalities. Subacute/healing right posterior rib fractures noted. Right shoulder arthroplasty noted. There is cervicothoracic spinal fixation hardware. The soft tissues are within normal limits. IMPRESSION: *Nonspecific opacities in the right para cardiac region, which may represent pneumonitis versus atelectasis and can be due to  suspected aspiration. Electronically Signed   By: Beula Brunswick M.D.   On: 04/01/2024 16:23   CT HEAD WO CONTRAST ( ) Result Date: 04/01/2024 CLINICAL DATA:  Altered mental status. EXAM: CT HEAD WITHOUT CONTRAST TECHNIQUE: Contiguous axial images were obtained from the base of the skull through the vertex without intravenous contrast. RADIATION DOSE REDUCTION: This exam was performed according to the departmental dose-optimization program which includes automated exposure control, adjustment of the mA and/or kV according to patient size and/or use of iterative reconstruction technique. COMPARISON:  February 10, 2024 FINDINGS: Brain: There is generalized cerebral atrophy with widening of the extra-axial spaces and ventricular dilatation. There are areas of decreased attenuation within the  white matter tracts of the supratentorial brain, consistent with microvascular disease changes. Stable areas of bilateral anterior temporal lobe and left frontal lobe encephalomalacia are noted. Vascular: Marked severity bilateral cavernous carotid artery calcification is seen. Skull: Normal. Negative for fracture or focal lesion. Sinuses/Orbits: There is marked severity left maxillary sinus, left-sided sphenoid sinus and right-sided frontal sinus mucosal thickening. Mild bilateral maxillary sinus mucosal thickening is also noted. Other: None. IMPRESSION: 1. Generalized cerebral atrophy with widening of the extra-axial spaces and ventricular dilatation. 2. Stable areas of bilateral anterior temporal lobe and left frontal lobe encephalomalacia. 3. Marked severity left maxillary sinus, left-sided sphenoid sinus and right-sided frontal sinus mucosal thickening. Electronically Signed   By: Virgle Grime M.D.   On: 04/01/2024 16:04    Procedures Procedures  {Document cardiac monitor, telemetry assessment procedure when appropriate:1}  CRITICAL CARE Performed by: Marine Sia Rad Gramling Total critical care time: 30 minutes Critical care time was exclusive of separately billable procedures and treating other patients. Critical care was necessary to treat or prevent imminent or life-threatening deterioration. Critical care was time spent personally by me on the following activities: development of treatment plan with patient and/or surrogate as well as nursing, discussions with consultants, evaluation of patient's response to treatment, examination of patient, obtaining history from patient or surrogate, ordering and performing treatments and interventions, ordering and review of laboratory studies, ordering and review of radiographic studies, pulse oximetry and re-evaluation of patient's condition.  Medications Ordered in ED Medications  enoxaparin  (LOVENOX ) injection 40 mg (has no administration in time  range)  acetaminophen  (TYLENOL ) tablet 650 mg (has no administration in time range)    Or  acetaminophen  (TYLENOL ) suppository 650 mg (has no administration in time range)  polyethylene glycol (MIRALAX  / GLYCOLAX ) packet 17 g (has no administration in time range)  sodium chloride  flush (NS) 0.9 % injection 3 mL (has no administration in time range)  dextrose  5 % in lactated ringers  infusion (has no administration in time range)  insulin  aspart (novoLOG ) injection 0-9 Units (has no administration in time range)  metroNIDAZOLE (FLAGYL) IVPB 500 mg (has no administration in time range)  thiamine (VITAMIN B1) injection 100 mg (has no administration in time range)  aspirin  EC tablet 81 mg (has no administration in time range)  megestrol (MEGACE) tablet 20 mg (has no administration in time range)  oxyCODONE  (Oxy IR/ROXICODONE ) immediate release tablet 5 mg (has no administration in time range)  senna-docusate (Senokot-S) tablet 2 tablet (has no administration in time range)  insulin  glargine-yfgn (SEMGLEE ) injection 10 Units (has no administration in time range)  aztreonam (AZACTAM) 1 g in sodium chloride  0.9 % 100 mL IVPB (has no administration in time range)  acyclovir (ZOVIRAX) 200 MG capsule 800 mg (has no administration in time range)  sodium chloride  0.9 % bolus  500 mL (0 mLs Intravenous Stopped 04/01/24 1842)  fentaNYL  (SUBLIMAZE ) injection 50 mcg (50 mcg Intravenous Given 04/01/24 1841)    ED Course/ Medical Decision Making/ A&P   {   Click here for ABCD2, HEART and other calculatorsREFRESH Note before signing :1}                              Medical Decision Making Amount and/or Complexity of Data Reviewed Labs: ordered. Radiology: ordered.  Risk Prescription drug management. Decision regarding hospitalization.    ANESIA HUTHER is a 83 y.o. female with a past medical history significant for diabetes, CAD, hypertension, previous anterior cerebral hemorrhage, dyslipidemia,  previous AV block with pacemaker, CKD, aortic stenosis status post TAVR, recent diagnosis of right torso shingles who presents with unresponsive episode and hypoxia.  According to family, for the last week and a half patient has been dealing with shingles on her right chest, right back, and right arm.  She has been on medications for this and has been on pain medicine and muscle relaxant.  She reportedly has had some difficulties with swallowing and in the past and had a choking episode today when taking half of a valacyclovir pill.  Family went to go cut the other half into fourths and when they returned the patient was laying unresponsive.  Patient did not get CPR but started having abnormal breathing.  Per EMS, oxygen saturation was 60% on room air.  Patient placed on a breather and is now de-escalated to 2 L due to hypoxia.  Patient does report some shortness of breath but denies any chest pain other than the pain from the rash.  She is unsure if she aspirated but is not having difficulty swallowing ever since starting the pain medicine and muscle relaxants for the shingles discomfort.  She has not been eating or drinking as much and has been staying in the bed a lot.  Family is concerned about her.  They report she has been slightly confused as well but denies any recent falls or head injuries.  She is denying headache or neck pain.  She is not other pain in her abdomen/pelvis or extremities.  On my exam, she has coarse breath sounds.  Oxygen saturations are low.  She has intact pulses.  Patient has symmetric smile and clear speech and pupils are symmetric and reactive with normal extract movements.  No focal neurologic deficits on initial exam but family reports that she is still slightly confused.  Clinically I suspect patient may have aspirated leading to this hypoxic respiratory failure episode and was unresponsive from this.  Will get chest x-ray and labs.  Will check for COVID given the respiratory  difficulty and hypoxia.  Will get screening labs as well.  Given her new oxygen requirement, anticipate admission.  Will give some fluids as she does not appear fluid overloaded with no edema and her mouth is very dry.  Anticipate admission after workup.   6:25 PM Workup again returned.  Patient does have leukocytosis.  Negative for COVID/flu/RSV.  Ammonia normal.  Magnesium  normal.  TSH normal.  Metabolic panel shows some electrolyte disturbances but normal kidney and liver function.  Chest x-ray does show some aspiration versus pneumonitis versus pneumonia.  Given the hypoxia and choking and cough, suspect pneumonia.  Will order antibiotics after speaking with pharmacy and call for admission.  CT head did not show acute.  Will call for admission.  {Document  critical care time when appropriate:1} {Document review of labs and clinical decision tools ie heart score, Chads2Vasc2 etc:1}  {Document your independent review of radiology images, and any outside records:1} {Document your discussion with family members, caretakers, and with consultants:1} {Document social determinants of health affecting pt's care:1} {Document your decision making why or why not admission, treatments were needed:1} Final Clinical Impression(s) / ED Diagnoses Final diagnoses:  Hypoxia  Pneumonitis  Aspiration into airway, initial encounter    Clinical Impression: 1. Hypoxia   2. Pneumonitis   3. Aspiration into airway, initial encounter     Disposition: Admit  This note was prepared with assistance of Dragon voice recognition software. Occasional wrong-word or sound-a-like substitutions may have occurred due to the inherent limitations of voice recognition software.

## 2024-04-01 NOTE — Addendum Note (Signed)
 Addended by: Lott Rouleau A on: 04/01/2024 05:31 PM   Modules accepted: Orders

## 2024-04-01 NOTE — Assessment & Plan Note (Signed)
 Continue with chronic aspirin 

## 2024-04-01 NOTE — ED Notes (Signed)
 Pt reports the last thing she remembers before coming here was laying in the floor, does not remember how or why she ended up in the floor.

## 2024-04-01 NOTE — ED Triage Notes (Signed)
 Pt BIB [facility] from home d/t family reporting pt was AMS/unresponsive. EMS arrived & she was alert, with repetitive speaking & tachypnea. Family reports she was at her A/Ox4 baseline until 1300 when they noticed her change in orientation & alertness. Pt does have Hx of dysphasia at baseline, V/S were 186/98, 108 bpm, was 60% on RA then 97% on NRB, 30 resp, 22g PIV Lt hand. Family did endorse she was recently having shingles outbreak (per EMS) with fecal incontinence upon their arrival on scene as well.

## 2024-04-01 NOTE — ED Notes (Signed)
Warm blankets applied on pt 

## 2024-04-01 NOTE — Progress Notes (Signed)
 Pharmacy Antibiotic Note  Danielle Clarke is a 83 y.o. female admitted on 04/01/2024 with pneumonia.  Pharmacy has been consulted for aztreonam dosing. Discussed with provider that patient has tolerated Keflex in the past, recommended ceftriaxone challenge. Provider aware of aztreonam/flagyl coverage gaps including strep pneumo  Plan: Aztreonam 1g q8h.  Follow culture data for de-escalation.  Monitor renal function for dose adjustments as indicated.   Height: 5' (152.4 cm) Weight: 40.4 kg (89 lb) IBW/kg (Calculated) : 45.5  Temp (24hrs), Avg:95.9 F (35.5 C), Min:94.1 F (34.5 C), Max:97.6 F (36.4 C)  Recent Labs  Lab 04/01/24 1451 04/01/24 1618  WBC 14.5*  --   CREATININE 0.78  --   LATICACIDVEN  --  1.4    Estimated Creatinine Clearance: 35.2 mL/min (by C-G formula based on SCr of 0.78 mg/dL).    Allergies  Allergen Reactions   Cefaclor Anaphylaxis    Tolerated cephalexin in 2018   Tetracycline Anaphylaxis   Vancomycin Anaphylaxis   Alendronate Sodium Other (See Comments)   Augmentin [Amoxicillin-Pot Clavulanate] Other (See Comments)    Reaction unknown >> Anaphylaxis Has patient had a PCN reaction causing immediate rash, facial/tongue/throat swelling, SOB or lightheadedness with hypotension: Unknown Has patient had a PCN reaction causing severe rash involving mucus membranes or skin necrosis: Unknown Has patient had a PCN reaction that required hospitalization: Unknown Has patient had a PCN reaction occurring within the last 10 years: Unknown If all of the above answers are "NO", then may proceed with Cephalosporin use.    Nsaids     CKD   Quinapril  Hcl     elevated potassium   Sulfamethoxazole -Trimethoprim      decreased kidney function   Prednisone Other (See Comments)    Reaction unknown    Antimicrobials this admission: Aztreonam 5/6 >>  Flagyl 5/6 >>   Microbiology results: 5/6 BCx:   Thank you for allowing pharmacy to be a part of this patient's  care.  Mamie Searles, PharmD, BCCCP  04/01/2024 9:48 PM

## 2024-04-01 NOTE — Assessment & Plan Note (Signed)
 This is chronically documented, however currently patient's diastolic blood pressure is less than 60.  Check cortisol in AM.  Hold all antihypertensive agents.  Start the patient on D5 LR infusion tonight.  Monitor.

## 2024-04-01 NOTE — ED Notes (Signed)
 Pt did not tolerate sitting up d/t generalized body pain & wants to try the swallow eval again later.

## 2024-04-01 NOTE — Assessment & Plan Note (Signed)
 This is chronic, continue with reduced dose of 10 units Lantus  every morning, insulin  sliding scale every 4 hourly ordered while patient is not on a diet pending her swallow evaluation.

## 2024-04-01 NOTE — Assessment & Plan Note (Signed)
 This is felt to be acute and symptomatic.  Maintain on bedrest till neurosurgery evaluation in the morning.  I did discuss with neurosurgery but overall plan will be TLSO brace with out of bed

## 2024-04-01 NOTE — Assessment & Plan Note (Signed)
 Present prior to admisiosn. No active vesicualr at this time. Azyclovir per phamrcy iv till swallow eval completed.

## 2024-04-01 NOTE — Progress Notes (Signed)
 Remote pacemaker transmission.

## 2024-04-01 NOTE — Assessment & Plan Note (Signed)
 Given the direct observer reports pain episode of choking and altered breathing proceeding patient has loss of consciousness, this is felt to be hypoxic respiratory failure related event.  However we will maintain the patient on telemetry given that the patient's EKG is showing some ST segment changes, I have discussed the case with Dr. Hayes Lipps of cardiology who will evaluate the patient.  He will interrogate the pacemaker in the morning and check a troponin.  I will also monitor patient's glucose.

## 2024-04-02 ENCOUNTER — Inpatient Hospital Stay (HOSPITAL_COMMUNITY)

## 2024-04-02 DIAGNOSIS — I350 Nonrheumatic aortic (valve) stenosis: Secondary | ICD-10-CM | POA: Diagnosis not present

## 2024-04-02 DIAGNOSIS — J9621 Acute and chronic respiratory failure with hypoxia: Secondary | ICD-10-CM

## 2024-04-02 DIAGNOSIS — R001 Bradycardia, unspecified: Secondary | ICD-10-CM | POA: Diagnosis not present

## 2024-04-02 DIAGNOSIS — R4020 Unspecified coma: Secondary | ICD-10-CM | POA: Diagnosis not present

## 2024-04-02 LAB — BASIC METABOLIC PANEL WITH GFR
Anion gap: 9 (ref 5–15)
BUN: 24 mg/dL — ABNORMAL HIGH (ref 8–23)
CO2: 26 mmol/L (ref 22–32)
Calcium: 8.2 mg/dL — ABNORMAL LOW (ref 8.9–10.3)
Chloride: 98 mmol/L (ref 98–111)
Creatinine, Ser: 0.73 mg/dL (ref 0.44–1.00)
GFR, Estimated: >60 mL/min (ref >60)
Glucose, Bld: 258 mg/dL — ABNORMAL HIGH (ref 70–99)
Potassium: 3.9 mmol/L (ref 3.5–5.1)
Sodium: 133 mmol/L — ABNORMAL LOW (ref 135–145)

## 2024-04-02 LAB — CBC
HCT: 32.5 % — ABNORMAL LOW (ref 36.0–46.0)
Hemoglobin: 10.8 g/dL — ABNORMAL LOW (ref 12.0–15.0)
MCH: 27.7 pg (ref 26.0–34.0)
MCHC: 33.2 g/dL (ref 30.0–36.0)
MCV: 83.3 fL (ref 80.0–100.0)
Platelets: 194 10*3/uL (ref 150–400)
RBC: 3.9 MIL/uL (ref 3.87–5.11)
RDW: 14.3 % (ref 11.5–15.5)
WBC: 9.5 10*3/uL (ref 4.0–10.5)
nRBC: 0 % (ref 0.0–0.2)

## 2024-04-02 LAB — GLUCOSE, CAPILLARY
Glucose-Capillary: 263 mg/dL — ABNORMAL HIGH (ref 70–99)
Glucose-Capillary: 211 mg/dL — ABNORMAL HIGH (ref 70–99)
Glucose-Capillary: 258 mg/dL — ABNORMAL HIGH (ref 70–99)
Glucose-Capillary: 202 mg/dL — ABNORMAL HIGH (ref 70–99)

## 2024-04-02 LAB — CORTISOL: Cortisol, Plasma: 24.4 ug/dL

## 2024-04-02 LAB — CBG MONITORING, ED
Glucose-Capillary: 184 mg/dL — ABNORMAL HIGH (ref 70–99)
Glucose-Capillary: 220 mg/dL — ABNORMAL HIGH (ref 70–99)
Glucose-Capillary: 247 mg/dL — ABNORMAL HIGH (ref 70–99)

## 2024-04-02 LAB — APTT: aPTT: 30 s (ref 24–36)

## 2024-04-02 LAB — TROPONIN I (HIGH SENSITIVITY): Troponin I (High Sensitivity): 252 ng/L (ref <18)

## 2024-04-02 LAB — PROTIME-INR
INR: 1.2 (ref 0.8–1.2)
Prothrombin Time: 15 s (ref 11.4–15.2)

## 2024-04-02 MED ORDER — ENOXAPARIN SODIUM 300 MG/3ML IJ SOLN
20.0000 mg | INTRAMUSCULAR | Status: DC
  Administered 2024-04-03 – 2024-04-04 (×2): 20 mg via SUBCUTANEOUS
  Filled 2024-04-02 (×3): qty 0.2

## 2024-04-02 MED ORDER — SODIUM CHLORIDE 0.9 % IV SOLN
2.0000 g | INTRAVENOUS | Status: DC
  Administered 2024-04-02 – 2024-04-04 (×3): 2 g via INTRAVENOUS
  Filled 2024-04-02 (×3): qty 20

## 2024-04-02 MED ORDER — ENOXAPARIN SODIUM 30 MG/0.3ML IJ SOSY: 30.0000 mg | PREFILLED_SYRINGE | INTRAMUSCULAR | Status: DC

## 2024-04-02 MED ORDER — ACYCLOVIR 400 MG PO TABS
800.0000 mg | ORAL_TABLET | Freq: Every day | ORAL | Status: DC
  Administered 2024-04-02 – 2024-04-04 (×11): 800 mg via ORAL
  Filled 2024-04-02 (×16): qty 2

## 2024-04-02 NOTE — Progress Notes (Signed)
 SLP Cancellation Note  Patient Details Name: Danielle Clarke MRN: [id number] DOB: 1941/03/07   Cancelled treatment:       Reason Eval/Treat Not Completed: Other (comment). Pt has esophagram pending and is appropriately NPO. Pt had MBS in 2023 that shoed both pharyngeal and esophageal dysphagia and esophagram was requested at that time as well, but doesn't appear that it was completed. SLP will await results of esophagram priro to clinical swallow eval.    [name], Hardin Leys 04/02/2024, 9:57 AM

## 2024-04-02 NOTE — Progress Notes (Signed)
 Orthopedic Tech Progress Note Patient Details:  JAMESHA MCCOIG 09-30-1941 [id number]  Ortho Devices Type of Ortho Device: Thoracolumbar corset (TLSO) Ortho Device/Splint Location: BACK Ortho Device/Splint Interventions: Ordered, Adjustment   Post Interventions Patient Tolerated: Well Instructions Provided: Care of device  Kermitt Pedlar 04/02/2024, 7:00 PM

## 2024-04-02 NOTE — Plan of Care (Signed)
   Problem: Education: Goal: Ability to describe self-care measures that may prevent or decrease complications (Diabetes Survival Skills Education) will improve Outcome: Not Progressing Goal: Individualized Educational Video(s) Outcome: Not Progressing   Problem: Coping: Goal: Ability to adjust to condition or change in health will improve Outcome: Not Progressing   Problem: Fluid Volume: Goal: Ability to maintain a balanced intake and output will improve Outcome: Not Progressing   Problem: Health Behavior/Discharge Planning: Goal: Ability to identify and utilize available resources and services will improve Outcome: Not Progressing Goal: Ability to manage health-related needs will improve Outcome: Not Progressing   Problem: Metabolic: Goal: Ability to maintain appropriate glucose levels will improve Outcome: Not Progressing   Problem: Nutritional: Goal: Maintenance of adequate nutrition will improve Outcome: Not Progressing Goal: Progress toward achieving an optimal weight will improve Outcome: Not Progressing   Problem: Skin Integrity: Goal: Risk for impaired skin integrity will decrease Outcome: Not Progressing   Problem: Tissue Perfusion: Goal: Adequacy of tissue perfusion will improve Outcome: Not Progressing

## 2024-04-02 NOTE — Consult Note (Signed)
 HPI:     Patient is a 83 y.o. female presented to ED via EMS after an episode of choking followed by unresponsiveness. She is admitted under the medicine service for management of LOC with respiratory failure and PNA. Workup also revealed age indeterminate T12 compression fracture. The patient currently complains of acute on chronic mid/LBP w/o radiation to the LES. She reports this pain has been present off and on since a water  skiing incident years ago. No SA, B&B dysfunction.      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) 12/01/2019   Traumatic brain injury with loss of consciousness (HCC) 05/28/2019   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 05/20/2019   Hypertensive crisis    Hypokalemia    Leukocytosis    Lethargy    Labile blood pressure    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    Acute blood loss anemia    Low grade fever    Hypoglycemia    Multiple closed fractures of ribs of right side    Pleural effusion on right    Hypoalbuminemia due to protein-calorie malnutrition (HCC)    Dysphagia    Traumatic cerebral intraparenchymal hematoma (HCC) 02/14/2019   Pacemaker    Labile blood glucose    Diabetes mellitus type 2 in nonobese (HCC)    Dyslipidemia    Essential hypertension    Hypernatremia    Anemia of chronic disease    Hypertensive urgency 02/07/2019   Intracerebral hemorrhage (HCC) 02/07/2019   Intracranial bleeding (HCC) 02/06/2019    Elevated troponin    Abnormal EKG    Bronchitis 08/05/2017   Bilateral carotid artery stenosis    Aortic stenosis 08/25/2015   Cervical stenosis of spinal canal 07/31/2014   Essential hypertension, benign 12/30/2013   Hyperlipidemia 12/30/2013   Toe infection 12/30/2013   Acute renal failure (HCC) 12/29/2013   Loss of consciousness (HCC) 01/02/2013   Diabetes mellitus type 1 (HCC) 01/02/2013   Bilateral carotid artery disease (HCC) 01/02/2013   GOITER, MULTINODULAR 02/11/2008   Past Medical History:  Diagnosis Date   Aortic stenosis, mild    Arthritis    Asthmatic bronchitis    with colds per patient   Carotid artery disease (HCC)    40-59% bilateral ICA stenosis   Cataract    Cutaneous abscess of right foot    Glaucoma    Heart murmur    Dr Felipe Horton is her  cardiologist.    Hyperlipemia    Hypertension    Dr. 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 Lorie Rook and Dr. Honey Lusty   Syncope    Type 1 diabetes mellitus [facility])     Past Surgical History:  Procedure Laterality Date   ANKLE FRACTURE SURGERY Left 2001   steel plate and 3 screws    ANTERIOR CERVICAL CORPECTOMY N/A  07/31/2014   Procedure: Cervical Four to Cervical Six Corpectomy;  Surgeon: Adelbert Adler, MD;  [facility]: [facility];  Service: Neurosurgery;  Laterality: N/A;  C4 to C6 Corpectomy   BREAST SURGERY  1988   CATARACT EXTRACTION W/ INTRAOCULAR LENS  IMPLANT, BILATERAL Bilateral 2011   right and then left   EYE SURGERY     FRACTURE SURGERY     HAMMER TOE SURGERY  1998   I & D EXTREMITY Right 09/27/2018   Procedure: IRRIGATION AND DEBRIDEMENT RIGHT FOOT;  Surgeon: Timothy Ford, MD;  Location: [facility] OR;  Service: Orthopedics;  Laterality: Right;   INTRAOPERATIVE TRANSTHORACIC ECHOCARDIOGRAM N/A 02/06/2023   Procedure: INTRAOPERATIVE TRANSTHORACIC ECHOCARDIOGRAM;  Surgeon: Kyra Phy, MD;  Location: [facility] OR;  Service: Open Heart Surgery;  Laterality: N/A;   JOINT REPLACEMENT     PACEMAKER IMPLANT N/A 02/10/2019   Procedure: PACEMAKER IMPLANT;  Surgeon: Tammie Fall, MD;  [facility]: [facility];  Service: Cardiovascular;  Laterality: N/A;   RIGHT/LEFT HEART CATH AND CORONARY ANGIOGRAPHY N/A 01/11/2023   Procedure: RIGHT/LEFT HEART CATH AND CORONARY ANGIOGRAPHY;  Surgeon: Kyra Phy, MD;  [facility]: [facility];  Service: Cardiovascular;  Laterality: N/A;   TONSILLECTOMY AND ADENOIDECTOMY  1948   TOTAL SHOULDER REPLACEMENT  2010   right shoulder    TRANSCATHETER AORTIC VALVE REPLACEMENT, TRANSFEMORAL N/A 02/06/2023   Procedure: Transcatheter Aortic Valve Replacement, Transfemoral;  Surgeon: Thukkani, Arun K, MD;  Location: [facility] OR;  Service: Open Heart Surgery;  Laterality: N/A;   TUBAL LIGATION  1979   TYMPANOSTOMY TUBE PLACEMENT Bilateral     Medications Prior to Admission  Medication Sig Dispense Refill Last Dose/Taking   aspirin  EC 81 MG tablet Take 1 tablet (81 mg total) by mouth daily. Swallow whole. 30 tablet 12 Past Week   HUMALOG KWIKPEN 100 UNIT/ML KwikPen Inject 0-10 Units into the skin 3 (three) times daily as needed (high blood sugar). Sliding Scale   04/01/2024 Morning   Insulin  Glargine (LANTUS ) 100 UNIT/ML Solostar Pen Inject 13 Units into the skin daily. (Patient taking differently: Inject 10-12 Units into the skin in the morning.) 15 mL 11 04/01/2024 Morning   megestrol (MEGACE) 20 MG tablet Take 20 mg by mouth 2 (two) times daily.   04/01/2024 Morning   Menthol -Methyl Salicylate (SALONPAS PAIN RELIEF PATCH) PTCH Apply 1 patch topically daily as needed (patch).   03/28/2024   methocarbamol (ROBAXIN) 750 MG tablet Take 750 mg by mouth 4 (four) times daily.   03/31/2024 Morning   protein supplement shake (PREMIER PROTEIN) LIQD Take 11 oz by mouth 2 (two) times daily. 30 mg of protein   04/01/2024   simvastatin  (ZOCOR ) 40 MG tablet Take 40 mg by mouth  at bedtime.   03/18/2024   traMADol  (ULTRAM ) 50 MG tablet Take 50 mg by mouth every 6 (six) hours as needed.   04/01/2024 Morning   valACYclovir (VALTREX) 1000 MG tablet Take 1,000 mg by mouth 2 (two) times daily.   04/01/2024 Morning   azithromycin  (ZITHROMAX ) 500 MG tablet Take 1 tablet (500 mg total) by mouth as needed. Take one tablet by mouth one hour prior to dental cleanings. (Patient not taking: Reported on 02/15/2024) 3 tablet 3 Not Taking   Continuous Glucose Receiver (DEXCOM G7 RECEIVER) DEVI See admin instructions. (Patient not taking: Reported on 02/15/2024)      Continuous Glucose Sensor (DEXCOM G7 SENSOR) MISC  (Patient not taking: Reported on 02/15/2024)  losartan  (COZAAR ) 25 MG tablet Take 0.5 tablets (12.5 mg total) by mouth at bedtime. (Patient not taking: Reported on 02/15/2024) 45 tablet 3 Not Taking   metoprolol  tartrate (LOPRESSOR ) 25 MG tablet Take 1 tablet (25 mg total) by mouth as directed for 1 dose. (Patient not taking: Reported on 04/01/2024) 1 tablet 0 Not Taking   Allergies  Allergen Reactions   Cefaclor Anaphylaxis    Tolerated cephalexin in 2018   Tetracycline Anaphylaxis   Vancomycin Anaphylaxis   Alendronate Sodium Other (See Comments)   Augmentin [Amoxicillin-Pot Clavulanate] Other (See Comments)    Reaction unknown >> Anaphylaxis Has patient had a PCN reaction causing immediate rash, facial/tongue/throat swelling, SOB or lightheadedness with hypotension: Unknown Has patient had a PCN reaction causing severe rash involving mucus membranes or skin necrosis: Unknown Has patient had a PCN reaction that required hospitalization: Unknown Has patient had a PCN reaction occurring within the last 10 years: Unknown If all of the above answers are "NO", then may proceed with Cephalosporin use.    Nsaids     CKD   Quinapril  Hcl     elevated potassium   Sulfamethoxazole -Trimethoprim      decreased kidney function   Prednisone Other (See Comments)    Reaction unknown     Social History   Tobacco Use   Smoking status: Never   Smokeless tobacco: Never  Substance Use Topics   Alcohol use: Yes    Alcohol/week: 2.0 standard drinks of alcohol    Types: 2 Glasses of wine per week    Family History  Problem Relation Age of Onset   Cancer Mother    Heart Problems Father       Objective:   Patient Vitals for the past 8 hrs:  BP Temp Temp src Pulse Resp SpO2 Height Weight  04/02/24 0848 (!) 125/52 -- -- 80 18 99 % 5' (1.524 m) 39.5 kg  04/02/24 0612 -- 97.6 F (36.4 C) Oral -- -- -- -- --  04/02/24 0600 (!) 129/49 -- -- 84 -- 100 % -- --  04/02/24 0545 (!) 106/40 -- -- 79 -- 100 % -- --  04/02/24 0530 (!) 139/48 -- -- 85 -- 100 % -- --  04/02/24 0515 (!) 118/43 -- -- 80 15 100 % -- --  04/02/24 0500 (!) 120/45 -- -- 80 -- 100 % -- --  04/02/24 0445 (!) 129/46 -- -- 83 -- 100 % -- --  04/02/24 0430 (!) 132/46 -- -- 81 17 100 % -- --  04/02/24 0415 (!) 122/47 -- -- 81 16 100 % -- --  04/02/24 0400 (!) 109/44 -- -- 78 17 100 % -- --  04/02/24 0345 (!) 105/41 -- -- 82 16 100 % -- --  04/02/24 0330 (!) 132/46 -- -- 89 17 100 % -- --  04/02/24 0315 (!) 111/39 -- -- 80 18 100 % -- --  04/02/24 0300 105/88 -- -- (!) 102 16 100 % -- --  04/02/24 0245 (!) 117/49 -- -- 80 16 100 % -- --  04/02/24 0230 (!) 125/45 -- -- 84 16 100 % -- --  04/02/24 0215 (!) 111/53 -- -- 82 16 100 % -- --  04/02/24 0200 (!) 104/53 -- -- 84 16 100 % -- --  04/02/24 0145 (!) 125/57 -- -- 91 13 100 % -- --   I/O last 3 completed shifts: In: 196.4 [IV Piggyback:196.4] Out: -  No intake/output data recorded.  Awake, alert, oriented Speech fluent, appropriate CN  grossly intact MAEs Strength 4/5 Sensation grossly intact   DG Thoracic Spine 2 View Result Date: 04/01/2024 CLINICAL DATA:  Back pain EXAM: THORACIC SPINE 2 VIEWS; LUMBAR SPINE - 2-3 VIEW COMPARISON:  CT chest abdomen pelvis 01/18/2023 FINDINGS: Right convex thoracic and left convex lumbar curve. Demineralization  and overlapping soft tissue limits assessment for subtle fractures. Compression fracture of T12 with 50% vertebral body height loss is new since 01/18/2023. Advanced lumbar spondylosis and facet arthropathy. IMPRESSION: Compression fracture of T12 is age indeterminate but new since 01/18/2023. Electronically Signed   By: Rozell Cornet M.D.   On: 04/01/2024 20:29   DG Lumbar Spine 2-3 Views Result Date: 04/01/2024 CLINICAL DATA:  Back pain EXAM: THORACIC SPINE 2 VIEWS; LUMBAR SPINE - 2-3 VIEW COMPARISON:  CT chest abdomen pelvis 01/18/2023 FINDINGS: Right convex thoracic and left convex lumbar curve. Demineralization and overlapping soft tissue limits assessment for subtle fractures. Compression fracture of T12 with 50% vertebral body height loss is new since 01/18/2023. Advanced lumbar spondylosis and facet arthropathy. IMPRESSION: Compression fracture of T12 is age indeterminate but new since 01/18/2023. Electronically Signed   By: Rozell Cornet M.D.   On: 04/01/2024 20:29   DG Chest Portable 1 View Result Date: 04/01/2024 CLINICAL DATA:  hypoxia to 60's, possible aspiration, AMS. EXAM: PORTABLE CHEST 1 VIEW COMPARISON:  12/04/2023. FINDINGS: There are nonspecific opacities in the right para cardiac region, which may represent pneumonitis versus atelectasis and can be due to suspected aspiration. Bilateral lung fields are otherwise clear. Bilateral costophrenic angles are clear. Stable cardio-mediastinal silhouette. Mitral annulus calcifications noted. 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 No acute osseous abnormalities. Subacute/healing right posterior rib fractures noted. Right shoulder arthroplasty noted. There is cervicothoracic spinal fixation hardware. The soft tissues are within normal limits. IMPRESSION: *Nonspecific opacities in the right para cardiac region, which may represent pneumonitis versus atelectasis and can be due to suspected aspiration. Electronically  Signed   By: Beula Brunswick M.D.   On: 04/01/2024 16:23   CT HEAD WO CONTRAST ( ) Result Date: 04/01/2024 CLINICAL DATA:  Altered mental status. EXAM: CT HEAD WITHOUT CONTRAST TECHNIQUE: Contiguous axial images were obtained from the base of the skull through the vertex without intravenous contrast. RADIATION DOSE REDUCTION: This exam was performed according to the departmental dose-optimization program which includes automated exposure control, adjustment of the mA and/or kV according to patient size and/or use of iterative reconstruction technique. COMPARISON:  February 10, 2024 FINDINGS: Brain: There is generalized cerebral atrophy with widening of the extra-axial spaces and ventricular dilatation. There are areas of decreased attenuation within the white matter tracts of the supratentorial brain, consistent with microvascular disease changes. Stable areas of bilateral anterior temporal lobe and left frontal lobe encephalomalacia are noted. Vascular: Marked severity bilateral cavernous carotid artery calcification is seen. Skull: Normal. Negative for fracture or focal lesion. Sinuses/Orbits: There is marked severity left maxillary sinus, left-sided sphenoid sinus and right-sided frontal sinus mucosal thickening. Mild bilateral maxillary sinus mucosal thickening is also noted. Other: None. IMPRESSION: 1. Generalized cerebral atrophy with widening of the extra-axial spaces and ventricular dilatation. 2. Stable areas of bilateral anterior temporal lobe and left frontal lobe encephalomalacia. 3. Marked severity left maxillary sinus, left-sided sphenoid sinus and right-sided frontal sinus mucosal thickening. Electronically Signed   By: Virgle Grime M.D.   On: 04/01/2024 16:04     Assessment:   Principal Problem:   Loss of consciousness (HCC) Active Problems:   Diabetes mellitus type 1 (  HCC)   Essential hypertension, benign   Choking   Acute hypoxic respiratory failure (HCC)   Compression fracture of  T12 vertebra (HCC)   Shingles  This is a 83 year old with age indeterminate T12 compression fracture, new since 12/2022 who is neurologically intact.  Plan:   -Patient's pain appears to be more chronic in nature. Recommend conservative management and pain control. Will also order a TLSO for her to wear when OOB. Should pain persist/worsen, could consider MRI for further evaluation of chronicity of fracture. At this time she is appropriate for outpatient follow up with repeat TL XR.   Kylle Lall CAYLIN Davia Smyre, PA-C

## 2024-04-02 NOTE — TOC CM/SW Note (Signed)
 Transition of Care [facility]) - Inpatient Brief Assessment   Patient Details  Name: Danielle Clarke MRN: [id number] Date of Birth: [birth year]  Transition of Care [facility]) CM/SW Contact:    Tom-Johnson, Sherria Riemann Daphne, RN Phone Number: 04/02/2024, 11:27 AM   Clinical Narrative:  Patient presented to the ED after being found unresponsive by her daughter. Per notes, patient was taking her meds prior daughter's return to her. Patient has hx of Dysphagia. Patient also has hx of Shingles/Varicella Zoster to her Rt Hemithorax for about 2 weeks and on medication for it.     Patient was found to have Pneumonia and T12 Fx. On IV abx. Neurosx consulted, plan for conservative treatment and TLSO brace. Cardiology following, patient has a Pacemaker.  Patient failed Swallow evaluation in the ER and Speech consulted to eval.   CM spoke with patient at bedside, patient states she lives alone, has two supportive children. Her daughter lives besides her home. Able to drive self at time prior admit. Has a cane, shower seat, rails and walker at home. Does not use home O2, currently on 2L O2 acute.   PCP is [name], [name], MD and uses [facility] on [facility].  Patient is active with [facility] home health disciplines. Orders will be placed once patient is Medically for discharge. Patient states she has a private caregiver as well.   Patient not Medically ready for discharge.  CM will continue to follow as patient progresses with care towards discharge.                 Transition of Care Asessment: Insurance and Status: Insurance coverage has been reviewed Patient has primary care physician: Yes Home environment has been reviewed: Yes Prior level of function:: Modified Independent Prior/Current Home Services: Current home services [facility]) Social Drivers of Health Review: SDOH reviewed no interventions necessary Readmission risk has been reviewed: Yes Transition of care needs:  transition of care needs identified, TOC will continue to follow

## 2024-04-02 NOTE — Evaluation (Addendum)
 Clinical/Bedside Swallow Evaluation Patient Details  Name: Danielle Clarke MRN: [id number] Date of Birth: 10-22-1941  Today's Date: 04/02/2024 Time: SLP Start Time (ACUTE ONLY): 1027 SLP Stop Time (ACUTE ONLY): 1035 SLP Time Calculation (min) (ACUTE ONLY): 8 min  Past Medical History:  Past Medical History:  Diagnosis Date   Aortic stenosis, mild    Arthritis    Asthmatic bronchitis    with colds per patient   Carotid artery disease (HCC)    40-59% bilateral ICA stenosis   Cataract    Cutaneous abscess of right foot    Glaucoma    Heart murmur    Dr Felipe Horton is her  cardiologist.    Hyperlipemia    Hypertension    Dr. 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 Lorie Rook and Dr. Honey Lusty   Syncope    Type 1 diabetes mellitus [facility])    Past Surgical History:  Past Surgical History:  Procedure Laterality Date   ANKLE FRACTURE SURGERY Left 2001   steel plate and 3 screws    ANTERIOR CERVICAL CORPECTOMY N/A 07/31/2014   Procedure: Cervical Four to Cervical Six Corpectomy;  Surgeon: Adelbert Adler, MD;  [facility];  Service: Neurosurgery;  Laterality: N/A;  C4 to C6 Corpectomy   BREAST SURGERY  1988   CATARACT EXTRACTION W/ INTRAOCULAR LENS  IMPLANT, BILATERAL Bilateral 2011   right and then left   EYE SURGERY     FRACTURE SURGERY     HAMMER TOE SURGERY  1998   I & D EXTREMITY Right 09/27/2018   Procedure: IRRIGATION AND DEBRIDEMENT RIGHT FOOT;  Surgeon: Timothy Ford, MD;  Location: [facility] OR;  Service: Orthopedics;  Laterality: Right;   INTRAOPERATIVE TRANSTHORACIC ECHOCARDIOGRAM N/A 02/06/2023   Procedure: INTRAOPERATIVE TRANSTHORACIC ECHOCARDIOGRAM;  Surgeon: Kyra Phy, MD;  Location: [facility] OR;  Service: Open Heart Surgery;  Laterality: N/A;   JOINT REPLACEMENT     PACEMAKER IMPLANT N/A 02/10/2019   Procedure: PACEMAKER IMPLANT;   Surgeon: Tammie Fall, MD;  [facility];  Service: Cardiovascular;  Laterality: N/A;   RIGHT/LEFT HEART CATH AND CORONARY ANGIOGRAPHY N/A 01/11/2023   Procedure: RIGHT/LEFT HEART CATH AND CORONARY ANGIOGRAPHY;  Surgeon: Kyra Phy, MD;  [facility];  Service: Cardiovascular;  Laterality: N/A;   TONSILLECTOMY AND ADENOIDECTOMY  1948   TOTAL SHOULDER REPLACEMENT  2010   right shoulder    TRANSCATHETER AORTIC VALVE REPLACEMENT, TRANSFEMORAL N/A 02/06/2023   Procedure: Transcatheter Aortic Valve Replacement, Transfemoral;  Surgeon: Thukkani, Arun K, MD;  Location: [facility] OR;  Service: Open Heart Surgery;  Laterality: N/A;   TUBAL LIGATION  1979   TYMPANOSTOMY TUBE PLACEMENT Bilateral    HPI:  83 year old female with history of HTN, HLD, AAS status post TAVR 2024, DM 1 who comes into the hospital with an acute choking episode and unresponsiveness.  Apparently she has been having chronic trouble swallowing with increased episodes of choking over the last several weeks.  She apparently was eating, she was noted to be coughing, daughter left her little bit but then when she returned patient was unresponsive.  EMS was called and she was found to be hypoxic into the 60s, requiring nonrebreather.  She was brought to the ER.  Of note, she has been having shingles and varicella of the  right hemithorax for the past couple of weeks, placed on antivirals, muscle relaxants and pain medications. Pt has a history of dysphagia; MBS in 2023 showed reduced PES relaxation and pharyngeal stasis, particularly with pill. Esopahgeal stasis also reported and pt recommended to have esophagram that was never completed.  Pt has a history of ACDF.    Assessment / Plan / Recommendation  Clinical Impression  SLP proceeded with evaluation to ensure pt is capable of participating in esophagram (pt does not need to be NPO prior). Pt reports increased difficulty swallowing recently, specifically her  pills. She says the trouble she had prior to admission was when trying to take a pill. She has been able to drink fluids without increased dificulty. Pt took consecutive sips of water  without coughing. She did have slight throat clearing and audible swallow. Pt grimaced when drinking, said wate was too cold. Pt recommended to proceed with esophagram given that prior MBS also indicated a cervical esophageal dysphagia and possible esophageal stasis. Expect pt will be able to initiate a puree/thin diet after exam.  SLP Visit Diagnosis: Dysphagia, unspecified (R13.10)    Aspiration Risk  Risk for inadequate nutrition/hydration;Moderate aspiration risk    Diet Recommendation Other (Comment) (defer diet until esophagram results)    Medication Administration: Crushed with puree    Other  Recommendations      Recommendations for follow up therapy are one component of a multi-disciplinary discharge planning process, led by the attending physician.  Recommendations may be updated based on patient status, additional functional criteria and insurance authorization.  Follow up Recommendations        Assistance Recommended at Discharge    Functional Status Assessment    Frequency and Duration            Prognosis        Swallow Study   General HPI: 83 year old female with history of HTN, HLD, AAS status post TAVR 2024, DM 1 who comes into the hospital with an acute choking episode and unresponsiveness.  Apparently she has been having chronic trouble swallowing with increased episodes of choking over the last several weeks.  She apparently was eating, she was noted to be coughing, daughter left her little bit but then when she returned patient was unresponsive.  EMS was called and she was found to be hypoxic into the 60s, requiring nonrebreather.  She was brought to the ER.  Of note, she has been having shingles and varicella of the right hemithorax for the past couple of weeks, placed on antivirals,  muscle relaxants and pain medications. Pt has a history of dysphagia; MBS in 2023 showed reduced PES relaxation and pharyngeal stasis, particularly with pill. Esopahgeal stasis also reported and pt recommended to have esophagram that was never completed.  Pt has a history of ACDF. Type of Study: Bedside Swallow Evaluation Diet Prior to this Study: NPO Temperature Spikes Noted: No Respiratory Status: Room air History of Recent Intubation: No Behavior/Cognition: Alert;Cooperative;Pleasant mood;Confused Oral Cavity Assessment: Dry Oral Care Completed by SLP: No Oral Cavity - Dentition: Adequate natural dentition Vision: Functional for self-feeding Self-Feeding Abilities: Able to feed self Patient Positioning: Upright in bed Baseline Vocal Quality: Normal Volitional Cough: Strong Volitional Swallow: Able to elicit    Oral/Motor/Sensory Function Overall Oral Motor/Sensory Function: Within functional limits   Ice Chips Ice chips: Not tested   Thin Liquid Thin Liquid: Impaired Presentation: Straw Pharyngeal  Phase Impairments: Throat Clearing - Immediate    Nectar Thick Nectar Thick Liquid: Not tested  Honey Thick Honey Thick Liquid: Not tested   Puree Puree: Not tested   Solid     Solid: Not tested      Valeri Gate 04/02/2024,12:15 PM

## 2024-04-02 NOTE — Progress Notes (Incomplete)
 Rounding Note    Patient Name: Danielle Clarke Date of Encounter: 04/02/2024  [facility] Cardiologist: Arun K Thukkani, MD ***  Subjective   ***  Inpatient Medications    Scheduled Meds:  acyclovir  800 mg Oral 5 X Daily   aspirin  EC  81 mg Oral Daily   enoxaparin  (LOVENOX ) injection  40 mg Subcutaneous Q24H   insulin  aspart  0-9 Units Subcutaneous Q4H   insulin  glargine-yfgn  10 Units Subcutaneous Daily   megestrol  20 mg Oral BID   senna-docusate  2 tablet Oral BID   sodium chloride  flush  3 mL Intravenous Q12H   thiamine (VITAMIN B1) injection  100 mg Intravenous Daily   Continuous Infusions:  aztreonam Stopped (04/02/24 0050)   dextrose  5% lactated ringers  100 mL/hr at 04/02/24 0130   metronidazole Stopped (04/02/24 0130)   PRN Meds: acetaminophen  **OR** acetaminophen , oxyCODONE , polyethylene glycol   Vital Signs    Vitals:   04/02/24 0530 04/02/24 0545 04/02/24 0600 04/02/24 0612  BP: (!) 139/48 (!) 106/40 (!) 129/49   Pulse: 85 79 84   Resp:      Temp:    97.6 F (36.4 C)  TempSrc:    Oral  SpO2: 100% 100% 100%   Weight:      Height:        Intake/Output Summary (Last 24 hours) at 04/02/2024 0818 Last data filed at 04/02/2024 0130 Gross per 24 hour  Intake 196.4 ml  Output --  Net 196.4 ml      04/01/2024    2:45 PM 02/15/2024   11:02 AM 02/10/2024   10:33 PM  Last 3 Weights  Weight (lbs) 89 lb 99 lb 12.8 oz 101 lb 13.6 oz   Weight (kg) 40.37 kg 45.269 kg 46.2 kg      Significant value      Telemetry    *** - Personally Reviewed  ECG    No new tracings - Personally Reviewed  Physical Exam  *** GEN: No acute distress.   Neck: No JVD Cardiac: RRR, no murmurs, rubs, or gallops.  Respiratory: Clear to auscultation bilaterally. GI: Soft, nontender, non-distended  MS: No edema; No deformity. Neuro:  Nonfocal  Psych: Normal affect   Labs    High Sensitivity Troponin:  No results for input(s): "TROPONINIHS" in the last 720  hours.   Chemistry Recent Labs  Lab 04/01/24 1451 04/01/24 1521 04/02/24 0512  NA 130*  --  133*  K 4.0  --  3.9  CL 91*  --  98  CO2 26  --  26  GLUCOSE 270*  --  258*  BUN 32*  --  24*  CREATININE 0.78  --  0.73  CALCIUM 8.8*  --  8.2*  MG  --  1.9  --   PROT 7.0  --   --   ALBUMIN 2.4*  --   --   AST 19  --   --   ALT 13  --   --   ALKPHOS 101  --   --   BILITOT 0.5  --   --   GFRNONAA >60  --  >60  ANIONGAP 13  --  9    Lipids No results for input(s): "CHOL", "TRIG", "HDL", "LABVLDL", "LDLCALC", "CHOLHDL" in the last 168 hours.  Hematology Recent Labs  Lab 04/01/24 1451 04/02/24 0512  WBC 14.5* 9.5  RBC 4.88 3.90  HGB 13.4 10.8*  HCT 40.7 32.5*  MCV 83.4 83.3  MCH 27.5 27.7  MCHC 32.9 33.2  RDW 14.1 14.3  PLT 258 194   Thyroid   Recent Labs  Lab 04/01/24 1521  TSH 3.035    BNPNo results for input(s): "BNP", "PROBNP" in the last 168 hours.  DDimer No results for input(s): "DDIMER" in the last 168 hours.   Radiology    DG Thoracic Spine 2 View Result Date: 04/01/2024 CLINICAL DATA:  Back pain EXAM: THORACIC SPINE 2 VIEWS; LUMBAR SPINE - 2-3 VIEW COMPARISON:  CT chest abdomen pelvis 01/18/2023 FINDINGS: Right convex thoracic and left convex lumbar curve. Demineralization and overlapping soft tissue limits assessment for subtle fractures. Compression fracture of T12 with 50% vertebral body height loss is new since 01/18/2023. Advanced lumbar spondylosis and facet arthropathy. IMPRESSION: Compression fracture of T12 is age indeterminate but new since 01/18/2023. Electronically Signed   By: Rozell Cornet M.D.   On: 04/01/2024 20:29   DG Lumbar Spine 2-3 Views Result Date: 04/01/2024 CLINICAL DATA:  Back pain EXAM: THORACIC SPINE 2 VIEWS; LUMBAR SPINE - 2-3 VIEW COMPARISON:  CT chest abdomen pelvis 01/18/2023 FINDINGS: Right convex thoracic and left convex lumbar curve. Demineralization and overlapping soft tissue limits assessment for subtle fractures. Compression  fracture of T12 with 50% vertebral body height loss is new since 01/18/2023. Advanced lumbar spondylosis and facet arthropathy. IMPRESSION: Compression fracture of T12 is age indeterminate but new since 01/18/2023. Electronically Signed   By: Rozell Cornet M.D.   On: 04/01/2024 20:29   DG Chest Portable 1 View Result Date: 04/01/2024 CLINICAL DATA:  hypoxia to 60's, possible aspiration, AMS. EXAM: PORTABLE CHEST 1 VIEW COMPARISON:  12/04/2023. FINDINGS: There are nonspecific opacities in the right para cardiac region, which may represent pneumonitis versus atelectasis and can be due to suspected aspiration. Bilateral lung fields are otherwise clear. Bilateral costophrenic angles are clear. Stable cardio-mediastinal silhouette. Mitral annulus calcifications noted. 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 No acute osseous abnormalities. Subacute/healing right posterior rib fractures noted. Right shoulder arthroplasty noted. There is cervicothoracic spinal fixation hardware. The soft tissues are within normal limits. IMPRESSION: *Nonspecific opacities in the right para cardiac region, which may represent pneumonitis versus atelectasis and can be due to suspected aspiration. Electronically Signed   By: Beula Brunswick M.D.   On: 04/01/2024 16:23   CT HEAD WO CONTRAST ( ) Result Date: 04/01/2024 CLINICAL DATA:  Altered mental status. EXAM: CT HEAD WITHOUT CONTRAST TECHNIQUE: Contiguous axial images were obtained from the base of the skull through the vertex without intravenous contrast. RADIATION DOSE REDUCTION: This exam was performed according to the departmental dose-optimization program which includes automated exposure control, adjustment of the mA and/or kV according to patient size and/or use of iterative reconstruction technique. COMPARISON:  February 10, 2024 FINDINGS: Brain: There is generalized cerebral atrophy with widening of the extra-axial spaces and ventricular dilatation.  There are areas of decreased attenuation within the white matter tracts of the supratentorial brain, consistent with microvascular disease changes. Stable areas of bilateral anterior temporal lobe and left frontal lobe encephalomalacia are noted. Vascular: Marked severity bilateral cavernous carotid artery calcification is seen. Skull: Normal. Negative for fracture or focal lesion. Sinuses/Orbits: There is marked severity left maxillary sinus, left-sided sphenoid sinus and right-sided frontal sinus mucosal thickening. Mild bilateral maxillary sinus mucosal thickening is also noted. Other: None. IMPRESSION: 1. Generalized cerebral atrophy with widening of the extra-axial spaces and ventricular dilatation. 2. Stable areas of bilateral anterior temporal lobe and left frontal lobe encephalomalacia. 3. Marked  severity left maxillary sinus, left-sided sphenoid sinus and right-sided frontal sinus mucosal thickening. Electronically Signed   By: Virgle Grime M.D.   On: 04/01/2024 16:04    Cardiac Studies   Echo 01/2024:  1. Left ventricular ejection fraction, by estimation, is 50 to 55%. The  left ventricle has low normal function. The left ventricle has no regional  wall motion abnormalities. Left ventricular diastolic parameters are  consistent with Grade I diastolic  dysfunction (impaired relaxation). The average left ventricular global  longitudinal strain is -19.2 %. The global longitudinal strain is normal.   2. Right ventricular systolic function is normal. The right ventricular  size is normal. There is mildly elevated pulmonary artery systolic  pressure. The estimated right ventricular systolic pressure is 42.4 mmHg.   3. The mitral valve is normal in structure. Mild mitral valve  regurgitation. No evidence of mitral stenosis. Severe mitral annular  calcification.   4. Tricuspid valve regurgitation is moderate.   5. DI 0.32. The aortic valve has been repaired/replaced. Aortic valve   regurgitation is not visualized. No aortic stenosis is present. There is a  20 mm Sapien prosthetic (TAVR) valve present in the aortic position. Echo  findings are consistent with  increased gradient of the aortic prosthesis. Aortic valve area, by VTI  measures 1.33 cm. Aortic valve mean gradient measures 26.0 mmHg. Aortic  valve Vmax measures 3.21 m/s.   6. The inferior vena cava is normal in size with greater than 50%  respiratory variability, suggesting right atrial pressure of 3 mmHg.   Patient Profile     83 y.o. female with a hx of bradycardia s/p PPM placement 2020, ICH secondary to fall, IDDM, HLD, and severe aortic stenosis s/p TAVR (02/06/23) who is being seen for the evaluation of possible syncope and pacemaker concern.  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 - device interrogated, no new arrhythmias, AS/VS - no changes   EKG changes - EKG appears consistent with repolarization abnormalities   Severe AS s/p TAVR - TAVR with increasing gradient, possibly due to size of valve - cardiac CT ordered, not completed - to rule out leaflet thrombosis - requires SBE PPX - azithromycin    Active shingles - per PCP, right hemithorax with upper back pain     {Are we signing off today?:[id number]}  For questions or updates, please contact [facility] Please consult [email or web address] for contact info under        Signed, Lamond Pilot, PA  04/02/2024, 8:18 AM

## 2024-04-02 NOTE — Progress Notes (Signed)
 PROGRESS NOTE  Danielle Clarke:[id number] DOB: October 04, 1941 DOA: 04/01/2024 PCP: Elester Grim, MD   LOS: 1 day   Brief Narrative / Interim history: 83 year old female with history of HTN, HLD, AAS status post TAVR 2024, DM 1 who comes into the hospital with an acute choking episode and unresponsiveness.  Apparently she has been having chronic trouble swallowing with increased episodes of choking over the last several weeks.  She apparently was eating, she was noted to be coughing, daughter left her little bit but then when she returned patient was unresponsive.  EMS was called and she was found to be hypoxic into the 60s, requiring nonrebreather.  She was brought to the ER.  Of note, she has been having shingles and varicella of the right hemithorax for the past couple of weeks, placed on antivirals, muscle relaxants and pain medications.  Subjective / 24h Interval events: She is mildly confused on my evaluation, denies any shortness of breath, no chest pain, no fever or chills  Assesement and Plan: Principal Problem:   Loss of consciousness (HCC) Active Problems:   Diabetes mellitus type 1 (HCC)   Essential hypertension, benign   Choking   Acute hypoxic respiratory failure (HCC)   Compression fracture of T12 vertebra (HCC)   Shingles  Principal problem Syncope -unclear circumstances, possibly after episode of choking and altered breathing.  She was found to be profoundly hypoxic initially required nonrebreather, but rapidly weaned off to 2 L upon her waking up.  She did have some ST segment changes, cardiology consulted and evaluated patient, did not felt this was significant and appeared chronic.  Active problems Dysphagia, difficulty swallowing -speech therapy consulted and will evaluate patient.  Obtain esophagogram.  Shingles -no active vesicles at this time.  Continue antivirals  T12 compression fracture-admitting MD discussed with neurosurgery.  For now TLSO brace while  out of bed.  PT consulted  DM1-continue glargine, sliding scale  Essential hypertension-blood pressure stable, monitor off home losartan   Acute hypoxic respiratory failure due to aspiration pneumonia-initially required nonrebreather, now down to 2 L.  Likely due to aspiration pneumonia.  Has been placed on antibiotics  Scheduled Meds:  acyclovir  800 mg Oral 5 X Daily   aspirin  EC  81 mg Oral Daily   enoxaparin  (LOVENOX ) injection  40 mg Subcutaneous Q24H   insulin  aspart  0-9 Units Subcutaneous Q4H   insulin  glargine-yfgn  10 Units Subcutaneous Daily   megestrol  20 mg Oral BID   senna-docusate  2 tablet Oral BID   sodium chloride  flush  3 mL Intravenous Q12H   thiamine (VITAMIN B1) injection  100 mg Intravenous Daily   Continuous Infusions:  aztreonam Stopped (04/02/24 0050)   dextrose  5% lactated ringers  100 mL/hr at 04/02/24 0130   metronidazole Stopped (04/02/24 0130)   PRN Meds:.acetaminophen  **OR** acetaminophen , oxyCODONE , polyethylene glycol  Current Outpatient Medications  Medication Instructions   aspirin  EC 81 mg, Oral, Daily, Swallow whole.   azithromycin  (ZITHROMAX ) 500 mg, Oral, As needed, Take one tablet by mouth one hour prior to dental cleanings.   Continuous Glucose Receiver (DEXCOM G7 RECEIVER) DEVI See admin instructions   Continuous Glucose Sensor (DEXCOM G7 SENSOR) MISC    HumaLOG KwikPen 0-10 Units, Subcutaneous, 3 times daily PRN, Sliding Scale   insulin  glargine (LANTUS ) 13 Units, Subcutaneous, Daily   losartan  (COZAAR ) 12.5 mg, Oral, Daily at bedtime   megestrol (MEGACE) 20 mg, Oral, 2 times daily   Menthol -Methyl Salicylate (SALONPAS PAIN RELIEF PATCH) PTCH 1  patch, Apply externally, Daily PRN   methocarbamol (ROBAXIN) 750 mg, Oral, 4 times daily   metoprolol  tartrate (LOPRESSOR ) 25 mg, Oral, As directed   protein supplement shake (PREMIER PROTEIN) LIQD 11 oz, Oral, 2 times daily, 30 mg of protein   simvastatin  (ZOCOR ) 40 mg, Oral, Daily at bedtime    traMADol  (ULTRAM ) 50 mg, Oral, Every 6 hours PRN   valACYclovir (VALTREX) 1,000 mg, Oral, 2 times daily    Diet Orders (From admission, onward)     Start     Ordered   04/01/24 1905  Diet NPO time specified  Diet effective now        04/01/24 1904            DVT prophylaxis: enoxaparin  (LOVENOX ) injection 40 mg Start: 04/02/24 0800 SCDs Start: 04/01/24 1904   Lab Results  Component Value Date   PLT 194 04/02/2024      Code Status: Full Code  Family Communication: No family at bedside  Status is: Inpatient Remains inpatient appropriate because: severity of illness  Level of care: Telemetry Medical  Consultants:  Neurosurgery   Objective: Vitals:   04/02/24 0545 04/02/24 0600 04/02/24 0612 04/02/24 0848  BP: (!) 106/40 (!) 129/49  (!) 125/52  Pulse: 79 84  80  Resp:    18  Temp:   97.6 F (36.4 C)   TempSrc:   Oral   SpO2: 100% 100%  99%  Weight:    39.5 kg  Height:    5' (1.524 m)    Intake/Output Summary (Last 24 hours) at 04/02/2024 0916 Last data filed at 04/02/2024 0130 Gross per 24 hour  Intake 196.4 ml  Output --  Net 196.4 ml   Wt Readings from Last 3 Encounters:  04/02/24 39.5 kg  02/15/24 45.3 kg  02/10/24 (S) 46.2 kg    Examination:  Constitutional: NAD Eyes: no scleral icterus ENMT: Mucous membranes are moist.  Neck: normal, supple Respiratory: clear to auscultation bilaterally, no wheezing, no crackles. Normal respiratory effort.  Cardiovascular: Regular rate and rhythm, no murmurs / rubs / gallops.  Abdomen: non distended, no tenderness. Bowel sounds positive.  Musculoskeletal: no clubbing / cyanosis.    Data Reviewed: I have independently reviewed following labs and imaging studies   CBC Recent Labs  Lab 04/01/24 1451 04/02/24 0512  WBC 14.5* 9.5  HGB 13.4 10.8*  HCT 40.7 32.5*  PLT 258 194  MCV 83.4 83.3  MCH 27.5 27.7  MCHC 32.9 33.2  RDW 14.1 14.3    Recent Labs  Lab 04/01/24 1451 04/01/24 1452  04/01/24 1521 04/01/24 1618 04/01/24 1621 04/01/24 2300 04/02/24 0512  NA 130*  --   --   --   --   --  133*  K 4.0  --   --   --   --   --  3.9  CL 91*  --   --   --   --   --  98  CO2 26  --   --   --   --   --  26  GLUCOSE 270*  --   --   --   --   --  258*  BUN 32*  --   --   --   --   --  24*  CREATININE 0.78  --   --   --   --   --  0.73  CALCIUM 8.8*  --   --   --   --   --  8.2*  AST 19  --   --   --   --   --   --   ALT 13  --   --   --   --   --   --   ALKPHOS 101  --   --   --   --   --   --   BILITOT 0.5  --   --   --   --   --   --   ALBUMIN 2.4*  --   --   --   --   --   --   MG  --   --  1.9  --   --   --   --   LATICACIDVEN  --   --   --  1.4  --  1.0  --   INR  --   --   --   --   --   --  1.2  TSH  --   --  3.035  --   --   --   --   HGBA1C  --  9.7*  --   --   --   --   --   AMMONIA  --   --   --   --  16  --   --     ------------------------------------------------------------------------------------------------------------------ No results for input(s): "CHOL", "HDL", "LDLCALC", "TRIG", "CHOLHDL", "LDLDIRECT" in the last 72 hours.  Lab Results  Component Value Date   HGBA1C 9.7 (H) 04/01/2024   ------------------------------------------------------------------------------------------------------------------ Recent Labs    04/01/24 1521  TSH 3.035    Cardiac Enzymes No results for input(s): "CKMB", "TROPONINI", "MYOGLOBIN" in the last 168 hours.  Invalid input(s): "CK" ------------------------------------------------------------------------------------------------------------------    Component Value Date/Time   BNP 302.4 (H) 08/06/2017 0441    CBG: Recent Labs  Lab 04/01/24 1502 04/02/24 0026 04/02/24 0137 04/02/24 0459 04/02/24 0905  GLUCAP 256* 220* 184* 247* 263*    Recent Results (from the past 240 hours)  Resp panel by RT-PCR (RSV, Flu A&B, Covid) Anterior Nasal Swab     Status: None   Collection Time: 04/01/24  4:33 PM    Specimen: Anterior Nasal Swab  Result Value Ref Range Status   SARS Coronavirus 2 by RT PCR NEGATIVE NEGATIVE Final   Influenza A by PCR NEGATIVE NEGATIVE Final   Influenza B by PCR NEGATIVE NEGATIVE Final    Comment: (NOTE) The Xpert Xpress SARS-CoV-2/FLU/RSV plus assay is intended as an aid in the diagnosis of influenza from Nasopharyngeal swab specimens and should not be used as a sole basis for treatment. Nasal washings and aspirates are unacceptable for Xpert Xpress SARS-CoV-2/FLU/RSV testing.  Fact Sheet for Patients: BloggerCourse.com  Fact Sheet for Healthcare Providers: SeriousBroker.it  This test is not yet approved or cleared by the [place]  FDA and has been authorized for detection and/or diagnosis of SARS-CoV-2 by FDA under an Emergency Use Authorization (EUA). This EUA will remain in effect (meaning this test can be used) for the duration of the COVID-19 declaration under Section 564(b)(1) of the Act, 21 U.S.C. section 360bbb-3(b)(1), unless the authorization is terminated or revoked.     Resp Syncytial Virus by PCR NEGATIVE NEGATIVE Final    Comment: (NOTE) Fact Sheet for Patients: BloggerCourse.com  Fact Sheet for Healthcare Providers: SeriousBroker.it  This test is not yet approved or cleared by the [place]  FDA and has been authorized for detection and/or diagnosis of SARS-CoV-2 by FDA under an Emergency Use  Authorization (EUA). This EUA will remain in effect (meaning this test can be used) for the duration of the COVID-19 declaration under Section 564(b)(1) of the Act, 21 U.S.C. section 360bbb-3(b)(1), unless the authorization is terminated or revoked.  Performed at [facility], [address], [place], [place] [address]   Culture, blood (Routine X 2) w Reflex to ID Panel     Status: None (Preliminary result)   Collection Time: 04/01/24  11:00 PM   Specimen: BLOOD  Result Value Ref Range Status   Specimen Description BLOOD SITE NOT SPECIFIED  Final   Special Requests   Final    BOTTLES DRAWN AEROBIC AND ANAEROBIC Blood Culture results may not be optimal due to an inadequate volume of blood received in culture bottles   Culture   Final    NO GROWTH < 12 HOURS Performed at [facility], [address] [place], [place] [address]    Report Status PENDING  Incomplete  Culture, blood (Routine X 2) w Reflex to ID Panel     Status: None (Preliminary result)   Collection Time: 04/01/24 11:15 PM   Specimen: BLOOD  Result Value Ref Range Status   Specimen Description BLOOD SITE NOT SPECIFIED  Final   Special Requests   Final    BOTTLES DRAWN AEROBIC AND ANAEROBIC Blood Culture results may not be optimal due to an inadequate volume of blood received in culture bottles   Culture   Final    NO GROWTH < 12 HOURS Performed at [facility], [address] [place], [place] [address]    Report Status PENDING  Incomplete     Radiology Studies: DG Thoracic Spine 2 View Result Date: 04/01/2024 CLINICAL DATA:  Back pain EXAM: THORACIC SPINE 2 VIEWS; LUMBAR SPINE - 2-3 VIEW COMPARISON:  CT chest abdomen pelvis 01/18/2023 FINDINGS: Right convex thoracic and left convex lumbar curve. Demineralization and overlapping soft tissue limits assessment for subtle fractures. Compression fracture of T12 with 50% vertebral body height loss is new since 01/18/2023. Advanced lumbar spondylosis and facet arthropathy. IMPRESSION: Compression fracture of T12 is age indeterminate but new since 01/18/2023. Electronically Signed   By: Rozell Cornet M.D.   On: 04/01/2024 20:29   DG Lumbar Spine 2-3 Views Result Date: 04/01/2024 CLINICAL DATA:  Back pain EXAM: THORACIC SPINE 2 VIEWS; LUMBAR SPINE - 2-3 VIEW COMPARISON:  CT chest abdomen pelvis 01/18/2023 FINDINGS: Right convex thoracic and left convex lumbar curve. Demineralization and overlapping  soft tissue limits assessment for subtle fractures. Compression fracture of T12 with 50% vertebral body height loss is new since 01/18/2023. Advanced lumbar spondylosis and facet arthropathy. IMPRESSION: Compression fracture of T12 is age indeterminate but new since 01/18/2023. Electronically Signed   By: Rozell Cornet M.D.   On: 04/01/2024 20:29   DG Chest Portable 1 View Result Date: 04/01/2024 CLINICAL DATA:  hypoxia to 60's, possible aspiration, AMS. EXAM: PORTABLE CHEST 1 VIEW COMPARISON:  12/04/2023. FINDINGS: There are nonspecific opacities in the right para cardiac region, which may represent pneumonitis versus atelectasis and can be due to suspected aspiration. Bilateral lung fields are otherwise clear. Bilateral costophrenic angles are clear. Stable cardio-mediastinal silhouette. Mitral annulus calcifications noted. 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 No acute osseous abnormalities. Subacute/healing right posterior rib fractures noted. Right shoulder arthroplasty noted. There is cervicothoracic spinal fixation hardware. The soft tissues are within normal limits. IMPRESSION: *Nonspecific opacities in the right para cardiac region, which may represent  pneumonitis versus atelectasis and can be due to suspected aspiration. Electronically Signed   By: Beula Brunswick M.D.   On: 04/01/2024 16:23   CT HEAD WO CONTRAST ( ) Result Date: 04/01/2024 CLINICAL DATA:  Altered mental status. EXAM: CT HEAD WITHOUT CONTRAST TECHNIQUE: Contiguous axial images were obtained from the base of the skull through the vertex without intravenous contrast. RADIATION DOSE REDUCTION: This exam was performed according to the departmental dose-optimization program which includes automated exposure control, adjustment of the mA and/or kV according to patient size and/or use of iterative reconstruction technique. COMPARISON:  February 10, 2024 FINDINGS: Brain: There is generalized cerebral atrophy  with widening of the extra-axial spaces and ventricular dilatation. There are areas of decreased attenuation within the white matter tracts of the supratentorial brain, consistent with microvascular disease changes. Stable areas of bilateral anterior temporal lobe and left frontal lobe encephalomalacia are noted. Vascular: Marked severity bilateral cavernous carotid artery calcification is seen. Skull: Normal. Negative for fracture or focal lesion. Sinuses/Orbits: There is marked severity left maxillary sinus, left-sided sphenoid sinus and right-sided frontal sinus mucosal thickening. Mild bilateral maxillary sinus mucosal thickening is also noted. Other: None. IMPRESSION: 1. Generalized cerebral atrophy with widening of the extra-axial spaces and ventricular dilatation. 2. Stable areas of bilateral anterior temporal lobe and left frontal lobe encephalomalacia. 3. Marked severity left maxillary sinus, left-sided sphenoid sinus and right-sided frontal sinus mucosal thickening. Electronically Signed   By: Virgle Grime M.D.   On: 04/01/2024 16:04     Kathlen Para, MD, PhD [facility]  Between 7 am - 7 pm I am available, please contact me via Amion (for emergencies) or Securechat (non urgent messages)  Between 7 pm - 7 am I am not available, please contact night coverage MD/APP via Amion

## 2024-04-03 ENCOUNTER — Encounter (HOSPITAL_COMMUNITY): Payer: Self-pay | Admitting: Internal Medicine

## 2024-04-03 DIAGNOSIS — E43 Unspecified severe protein-calorie malnutrition: Secondary | ICD-10-CM | POA: Insufficient documentation

## 2024-04-03 DIAGNOSIS — R4020 Unspecified coma: Secondary | ICD-10-CM | POA: Diagnosis not present

## 2024-04-03 LAB — GLUCOSE, CAPILLARY
Glucose-Capillary: 101 mg/dL — ABNORMAL HIGH (ref 70–99)
Glucose-Capillary: 174 mg/dL — ABNORMAL HIGH (ref 70–99)
Glucose-Capillary: 177 mg/dL — ABNORMAL HIGH (ref 70–99)
Glucose-Capillary: 257 mg/dL — ABNORMAL HIGH (ref 70–99)
Glucose-Capillary: 289 mg/dL — ABNORMAL HIGH (ref 70–99)
Glucose-Capillary: 325 mg/dL — ABNORMAL HIGH (ref 70–99)
Glucose-Capillary: 66 mg/dL — ABNORMAL LOW (ref 70–99)

## 2024-04-03 MED ORDER — ADULT MULTIVITAMIN W/MINERALS CH
1.0000 | ORAL_TABLET | Freq: Every day | ORAL | Status: DC
  Administered 2024-04-03 – 2024-04-04 (×2): 1 via ORAL
  Filled 2024-04-03 (×2): qty 1

## 2024-04-03 MED ORDER — ENSURE ENLIVE PO LIQD
237.0000 mL | Freq: Two times a day (BID) | ORAL | Status: DC
  Administered 2024-04-03 – 2024-04-04 (×3): 237 mL via ORAL

## 2024-04-03 NOTE — Progress Notes (Signed)
 Initial Nutrition Assessment  DOCUMENTATION CODES:  Severe malnutrition in context of chronic illness  INTERVENTION:  Add Ensure Enlive po BID, each supplement provides 350 kcal and 20 grams of protein.  Add Magic cup TID with meals, each supplement provides 290 kcal and 9 grams of protein  Discussed methods to increase calorie/protein intake and handout attached to AVS MVI w/ minerals  NUTRITION DIAGNOSIS:  Severe Malnutrition related to chronic illness (dysphagia, HLD, T1DM, AAS s/p TAVR 2024) as evidenced by severe muscle depletion, severe fat depletion, percent weight loss.  GOAL:  Patient will meet greater than or equal to 90% of their needs  MONITOR:  PO intake, Supplement acceptance, Diet advancement, Weight trends  REASON FOR ASSESSMENT:  Consult Assessment of nutrition requirement/status  ASSESSMENT:  Pt with PMH significant for: HTN, HLD, AAS s/p TAVR 2024, T1DM. Presented with acute choking episode and unresponsiveness. Has been having trouble swallowing over last several weeks w/ increased choking occurrences. Also noted w/ shingles and varicells of the R hemithorax and remains on antivirals.  Speech advanced patient diet to DYS2, thin liquids today. Will likely discharge tomorrow. Spoke with patient and her daughter, Danielle Clarke, at bedside. Pt pleasant and conversant.  Average Meal Intake No documentation to review  Patient does not meet estimated calorie and protein needs at baseline. She reports early satiety and recent difficulties with chewing and swallowing. Choking episode precipitated current admission. Pt daughter lives next door and has a caregiver that sits with her during the day. Patient does not cook and consumes mostly convenience items.  24 Hour Recall B: 25% of a Poptart w/ coffee, creamer, Stevia L: Lunchable OR canned spaghetti, Ginger Ale (regular) OR [place]  Tea D: Popeye's shrimp OR pizza OR "whatever is around" Snacks: Premier protein x1  daily  Discussed need for increased calories and protein d/t recent, significant weight loss as well as preventing skin breakdown. Recommending smaller, more frequent meals as patient reports feeling overwhelmed when presented with large, "heavy" meals. Encouraged a higher calorie protein supplement than Premier Protein as patient needs both calories and protein to promote weight gain. Daughter and patient verbalize understanding. Went over easy ways to amp up protein and calories in low protein items. Examples included: unflavored protein powder, use of higher calorie additives in sauces/gravies for her "moist and minced" diet. Speech in to provide education handout around diet texture. Added ways to increase calories and protein to AVS and alerted patient's daughter to this.  Admit Weight: 40.4kg Current Weight: 39.5kg  Per chart review, she has lost 12% of her body weight in last year, which is not considered clinically significant for the time frame reviewed. She has lost 15% in last six months, which is considered clinically significant for the time frame. Patient's daughter endorses UBW as around 100lbs for last 6-12 months. This correlates to 13% body weight loss. No edema on exam. Has not had BM since admission. Baseline is 1x daily.   Intake/Output Summary (Last 24 hours) at 04/03/2024 1155 Last data filed at 04/02/2024 1920 Gross per 24 hour  Intake 797.1 ml  Output 50 ml  Net 747.1 ml    Net IO Since Admission: 1,893.06 mL [04/03/24 1155]   Meds: acyclovir, SSI 0-9 q4, Semglee  10 daily, Megace, senna-docusate, thiamine Drips: IV ABX  Labs:  Na+ 130>133 (L) K+ 3.9 (wdl) WBC 14.5>9.5 (wdl) CBGs 258-270 x24 hours A1c 9.7 (03/2024)  NUTRITION - FOCUSED PHYSICAL EXAM:  Flowsheet Row Most Recent Value  Orbital Region Severe depletion  Upper  Arm Region Severe depletion  Thoracic and Lumbar Region Severe depletion  Buccal Region Severe depletion  Temple Region Severe depletion   Clavicle Bone Region Severe depletion  Clavicle and Acromion Bone Region Severe depletion  Scapular Bone Region Severe depletion  Dorsal Hand Moderate depletion  Patellar Region Severe depletion  Anterior Thigh Region Severe depletion  Posterior Calf Region Severe depletion  Edema (RD Assessment) None  Hair Reviewed  Eyes Reviewed  Mouth Reviewed  Skin Reviewed  Nails Reviewed    Diet Order:   Diet Order             DIET - DYS 1 Room service appropriate? Yes; Fluid consistency: Thin  Diet effective now             EDUCATION NEEDS:   Education needs have been addressed  Skin:  Skin Assessment: Skin Integrity Issues: Skin Integrity Issues:: Stage II Stage II: sacrum  Last BM:  PTA - goes 1x daily at baseline  Height:  Ht Readings from Last 1 Encounters:  04/02/24 5' (1.524 m)   Weight:  Wt Readings from Last 1 Encounters:  04/02/24 39.5 kg   Ideal Body Weight:  45.5 kg  BMI:  Body mass index is 17.01 kg/m.  Estimated Nutritional Needs:   Kcal:  1300-1500kcals  Protein:  65-80g  Fluid:  >1.3L/day  Con Decant MS, RD, LDN Registered Dietitian Clinical Nutrition RD Inpatient Contact Info in Amion

## 2024-04-03 NOTE — Progress Notes (Signed)
 Speech Language Pathology Treatment: Dysphagia  Patient Details Name: Danielle Clarke MRN: [id number] DOB: 09/22/1941 Today's Date: 04/03/2024 Time: 1100-1120 SLP Time Calculation (min) (ACUTE ONLY): 20 min  Assessment / Plan / Recommendation Clinical Impression  Pt demonstrates minimal interest in food, she has been drinking liquids, but has not eaten much. Daughter at bedside reports early satiety, eats one meal a day and kind of picks at it. May not be choosing soft moist foods that will easily transit though restricted PES and esophagus. SLP reviewed problems with daughter using imaging, shared written esophageal precautions and texture recommendations. Emphasized avoiding firm particulates that could be choking hazards and crushing meds when possible. Will advance pt to minced and moist solids. Dietitian also seeing pt to address nutrition. Seems that dysphagia is contributing to poor nutrition and weakness. No further acute needs for dysphagia as education is complete at this time. Will sign off.   HPI HPI: 83 year old female with history of HTN, HLD, AAS status post TAVR 2024, DM 1 who comes into the hospital with an acute choking episode and unresponsiveness.  Apparently she has been having chronic trouble swallowing with increased episodes of choking over the last several weeks.  She apparently was eating, she was noted to be coughing, daughter left her little bit but then when she returned patient was unresponsive.  EMS was called and she was found to be hypoxic into the 60s, requiring nonrebreather.  She was brought to the ER.  Of note, she has been having shingles and varicella of the right hemithorax for the past couple of weeks, placed on antivirals, muscle relaxants and pain medications. Pt has a history of dysphagia; MBS in 2023 showed reduced PES relaxation and pharyngeal stasis, particularly with pill. Esopahgeal stasis also reported and pt recommended to have esophagram that was never  completed.  Pt has a history of ACDF.      SLP Plan  Continue with current plan of care      Recommendations for follow up therapy are one component of a multi-disciplinary discharge planning process, led by the attending physician.  Recommendations may be updated based on patient status, additional functional criteria and insurance authorization.    Recommendations  Diet recommendations: Thin liquid;Dysphagia 2 (fine chop) Liquids provided via: Cup;Straw Medication Administration: Crushed with puree Supervision: Patient able to self feed Compensations: Slow rate;Small sips/bites;Follow solids with liquid                  Oral care BID           Continue with current plan of care     Guiselle Mian, Hardin Leys  04/03/2024, 11:51 AM

## 2024-04-03 NOTE — NC FL2 (Signed)
 [place]  MEDICAID FL2 LEVEL OF CARE FORM     IDENTIFICATION  Patient Name: Danielle Clarke Birthdate: October 29, 1941 Sex: female Admission Date (Current Location): 04/01/2024  [facility] and [place] Number:  Producer, television/film/video and Address:  [facility], 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) 12/01/2019   Traumatic brain injury with loss of consciousness (HCC) 05/28/2019   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 05/20/2019   Hypertensive crisis    Hypokalemia    Leukocytosis    Lethargy    Labile blood pressure    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    Acute blood loss anemia    Low grade fever    Hypoglycemia    Multiple closed fractures of ribs of right side    Pleural effusion on right    Hypoalbuminemia due to protein-calorie malnutrition (HCC)    Dysphagia    Traumatic cerebral intraparenchymal hematoma (HCC) 02/14/2019   Pacemaker    Labile blood glucose    Diabetes mellitus type 2 in nonobese (HCC)    Dyslipidemia    Essential  hypertension    Hypernatremia    Anemia of chronic disease    Hypertensive urgency 02/07/2019   Intracerebral hemorrhage (HCC) 02/07/2019   Intracranial bleeding (HCC) 02/06/2019   Elevated troponin    Abnormal EKG    Bronchitis 08/05/2017   Bilateral carotid artery stenosis    Aortic stenosis 08/25/2015   Cervical stenosis of spinal canal 07/31/2014   Essential hypertension, benign 12/30/2013   Hyperlipidemia 12/30/2013   Toe infection 12/30/2013   Acute renal failure (HCC) 12/29/2013   Loss of consciousness (HCC) 01/02/2013   Diabetes mellitus type 1 (HCC) 01/02/2013   Bilateral carotid artery disease (HCC) 01/02/2013   GOITER, MULTINODULAR 02/11/2008    Orientation RESPIRATION BLADDER Height & Weight     Self, Place  Normal Incontinent Weight: 87 lb 1.3 oz (39.5 kg) Height:  5' (152.4 cm)  BEHAVIORAL SYMPTOMS/MOOD NEUROLOGICAL BOWEL NUTRITION STATUS      Continent Diet (See dc summary)  AMBULATORY STATUS COMMUNICATION OF NEEDS Skin   Limited Assist Verbally PU Stage and Appropriate Care (Stage II on sacrum)                       Personal Care Assistance Level of Assistance  Bathing, Feeding, Dressing Bathing Assistance: Limited assistance Feeding assistance: Limited assistance Dressing Assistance: Limited assistance     Functional Limitations Info             SPECIAL CARE FACTORS FREQUENCY  PT (By licensed PT), OT (By licensed OT)     PT Frequency: 5x/week OT Frequency: 5x/week            Contractures Contractures Info: Not present    Additional Factors Info  Code Status, Allergies, Insulin  Sliding Scale Code Status Info: Full Allergies Info: Cefaclor, Tetracycline, Vancomycin, Alendronate Sodium, Augmentin (Amoxicillin-pot Clavulanate), Nsaids, Quinapril  Hcl, Sulfamethoxazole -trimethoprim , Prednisone   Insulin  Sliding Scale Info: see dc summary       Current Medications (04/03/2024):  This is the current hospital active medication  list Current Facility-Administered Medications  Medication Dose Route Frequency Provider Last Rate Last Admin   acetaminophen  (TYLENOL ) tablet 650 mg  650 mg Oral Q6H PRN Bennie Brave, MD       Or   acetaminophen  (TYLENOL ) suppository 650 mg  650 mg Rectal Q6H PRN Bennie Brave, MD       acyclovir (ZOVIRAX) tablet 800 mg  800 mg Oral 5 X Daily Bennie Brave, MD   800 mg at 04/03/24 1031   aspirin  EC tablet 81 mg  81 mg Oral Daily Goel, Hersh, MD   81 mg at 04/03/24 1031   cefTRIAXone (ROCEPHIN) 2 g in sodium chloride  0.9 % 100 mL IVPB  2 g Intravenous Q24H Gherghe, Costin M, MD 200 mL/hr at 04/03/24 1245 2 g at 04/03/24 1245   enoxaparin  (LOVENOX ) 100 mg/mL injection 20 mg  20 mg Subcutaneous Q24H Gherghe, Costin M, MD   20 mg at 04/03/24 1030   feeding supplement (ENSURE ENLIVE / ENSURE PLUS) liquid 237 mL  237 mL Oral BID BM Gherghe, Costin M, MD       insulin  aspart (novoLOG ) injection 0-9 Units  0-9 Units Subcutaneous Q4H Bennie Brave, MD   5 Units at 04/03/24 1240   insulin  glargine-yfgn (SEMGLEE ) injection 10 Units  10 Units Subcutaneous Daily Goel, Hersh, MD   10 Units at 04/03/24 1030   megestrol (MEGACE) tablet 20 mg  20 mg Oral BID Goel, Hersh, MD   20 mg at 04/03/24 1032   multivitamin with minerals tablet 1 tablet  1 tablet Oral Daily Gherghe, Costin M, MD   1 tablet at 04/03/24 1240   oxyCODONE  (Oxy IR/ROXICODONE ) immediate release tablet 5 mg  5 mg Oral Q4H PRN Goel, Hersh, MD   5 mg at 04/03/24 1033   polyethylene glycol (MIRALAX  / GLYCOLAX ) packet 17 g  17 g Oral Daily PRN Bennie Brave, MD       senna-docusate (Senokot-S) tablet 2 tablet  2 tablet Oral BID Goel, Hersh, MD   2 tablet at 04/03/24 1031   sodium chloride  flush (NS) 0.9 % injection 3 mL  3 mL Intravenous Q12H Bennie Brave, MD   3 mL at 04/03/24 1033   thiamine (VITAMIN B1) injection 100 mg  100 mg Intravenous Daily Goel, Hersh, MD   100 mg at 04/03/24 1032     Discharge Medications: Please see discharge summary for a list  of discharge medications.  Relevant Imaging Results:  Relevant Lab Results:   Additional Information SS# [id number] [place], [place]

## 2024-04-03 NOTE — Inpatient Diabetes Management (Signed)
 Inpatient Diabetes Program Recommendations  AACE/ADA: New Consensus Statement on Inpatient Glycemic Control (2015)  Target Ranges:  Prepandial:   less than 140 mg/dL      Peak postprandial:   less than 180 mg/dL (1-2 hours)      Critically ill patients:  140 - 180 mg/dL   Lab Results  Component Value Date   GLUCAP 289 (H) 04/03/2024   HGBA1C 9.7 (H) 04/01/2024    Review of Glycemic Control  Latest Reference Range & Units 04/02/24 09:05 04/02/24 12:39 04/02/24 16:48 04/02/24 19:58 04/03/24 00:26 04/03/24 06:18 04/03/24 06:59 04/03/24 07:30 04/03/24 11:43  Glucose-Capillary 70 - 99 mg/dL 098 (H) 119 (H) 147 (H) 202 (H) 177 (H) 66 (L) 101 (H) 174 (H) 289 (H)   Diabetes history: DM 2 Outpatient Diabetes medications: Humalog 0-10 units tid, Lantus  10-12 units Daily Current orders for Inpatient glycemic control:  Semglee  10 units Daily Novolog  0-9 units Q4 hours Diet ordered Note: hypoglycemia this am  Inpatient Diabetes Program Recommendations:    -   Change Frequency of Novolog  Correction to 0-9 units tid + hs from Q4 hours  Thanks,  Eloise Hake RN, MSN, BC-ADM Inpatient Diabetes Coordinator Team Pager [phone] (8a-5p)

## 2024-04-03 NOTE — Consult Note (Signed)
 Reason for Consult: Dysphagia Referring Physician: Hospital team  Danielle Clarke is an 83 y.o. female.  HPI: Patient seen and examined and her hospital computer chart and her office computer chart reviewed and she has a long history of oropharyngeal dysphagia see 2023 barium swallow and in talking to her she says her daughter does a good job feeding her and she is about to be fed by the nurses aide and she minimizes her symptoms has no abdominal complaints and we could not find a previous endoscopy in either computer chart even though she said she had 1 years ago and her last colonoscopy in 2017 was normal and she has no other current complaints  Past Medical History:  Diagnosis Date   Aortic stenosis, mild    Arthritis    Asthmatic bronchitis    with colds per patient   Carotid artery disease (HCC)    40-59% bilateral ICA stenosis   Cataract    Cutaneous abscess of right foot    Glaucoma    Heart murmur    Dr Felipe Horton is her  cardiologist.    Hyperlipemia    Hypertension    Dr. 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 Lorie Rook and Dr. Honey Lusty   Syncope    Type 1 diabetes mellitus [facility])     Past Surgical History:  Procedure Laterality Date   ANKLE FRACTURE SURGERY Left 2001   steel plate and 3 screws    ANTERIOR CERVICAL CORPECTOMY N/A 07/31/2014   Procedure: Cervical Four to Cervical Six Corpectomy;  Surgeon: Adelbert Adler, MD;  Location: [facility] NEURO ORS;  Service: Neurosurgery;  Laterality: N/A;  C4 to C6 Corpectomy   BREAST SURGERY  1988   CATARACT EXTRACTION W/ INTRAOCULAR LENS  IMPLANT, BILATERAL Bilateral 2011   right and then left   EYE SURGERY     FRACTURE SURGERY     HAMMER TOE SURGERY  1998   I & D EXTREMITY Right 09/27/2018   Procedure: IRRIGATION AND DEBRIDEMENT RIGHT FOOT;  Surgeon: Timothy Ford, MD;  Location: [facility] OR;  Service:  Orthopedics;  Laterality: Right;   INTRAOPERATIVE TRANSTHORACIC ECHOCARDIOGRAM N/A 02/06/2023   Procedure: INTRAOPERATIVE TRANSTHORACIC ECHOCARDIOGRAM;  Surgeon: Kyra Phy, MD;  Location: [facility] OR;  Service: Open Heart Surgery;  Laterality: N/A;   JOINT REPLACEMENT     PACEMAKER IMPLANT N/A 02/10/2019   Procedure: PACEMAKER IMPLANT;  Surgeon: Tammie Fall, MD;  Location: [facility] INVASIVE CV LAB;  Service: Cardiovascular;  Laterality: N/A;   RIGHT/LEFT HEART CATH AND CORONARY ANGIOGRAPHY N/A 01/11/2023   Procedure: RIGHT/LEFT HEART CATH AND CORONARY ANGIOGRAPHY;  Surgeon: Kyra Phy, MD;  Location: [facility] INVASIVE CV LAB;  Service: Cardiovascular;  Laterality: N/A;   TONSILLECTOMY AND ADENOIDECTOMY  1948   TOTAL SHOULDER REPLACEMENT  2010   right shoulder    TRANSCATHETER AORTIC VALVE REPLACEMENT, TRANSFEMORAL N/A 02/06/2023   Procedure: Transcatheter Aortic Valve Replacement, Transfemoral;  Surgeon: Thukkani, Arun K, MD;  Location: [facility] OR;  Service: Open Heart Surgery;  Laterality: N/A;   TUBAL LIGATION  1979   TYMPANOSTOMY TUBE PLACEMENT Bilateral     Family History  Problem Relation Age of Onset   Cancer Mother    Heart Problems Father     Social History:  reports that she has never smoked. She has never used  smokeless tobacco. She reports current alcohol use of about 2.0 standard drinks of alcohol per week. She reports that she does not use drugs.  Allergies:  Allergies  Allergen Reactions   Cefaclor Anaphylaxis    Tolerated cephalexin in 2018   Tetracycline Anaphylaxis   Vancomycin Anaphylaxis   Alendronate Sodium Other (See Comments)   Augmentin [Amoxicillin-Pot Clavulanate] Other (See Comments)    Reaction unknown >> Anaphylaxis Has patient had a PCN reaction causing immediate rash, facial/tongue/throat swelling, SOB or lightheadedness with hypotension: Unknown Has patient had a PCN reaction causing severe rash involving mucus membranes or skin necrosis: Unknown Has patient  had a PCN reaction that required hospitalization: Unknown Has patient had a PCN reaction occurring within the last 10 years: Unknown If all of the above answers are "NO", then may proceed with Cephalosporin use.    Nsaids     CKD   Quinapril  Hcl     elevated potassium   Sulfamethoxazole -Trimethoprim      decreased kidney function   Prednisone Other (See Comments)    Reaction unknown    Medications: I have reviewed the patient's current medications.  Results for orders placed or performed during the hospital encounter of 04/01/24 (from the past 48 hours)  Comprehensive metabolic panel     Status: Abnormal   Collection Time: 04/01/24  2:51 PM  Result Value Ref Range   Sodium 130 (L) 135 - 145 mmol/L   Potassium 4.0 3.5 - 5.1 mmol/L   Chloride 91 (L) 98 - 111 mmol/L   CO2 26 22 - 32 mmol/L   Glucose, Bld 270 (H) 70 - 99 mg/dL    Comment: Glucose reference range applies only to samples taken after fasting for at least 8 hours.   BUN 32 (H) 8 - 23 mg/dL   Creatinine, Ser 4.09 0.44 - 1.00 mg/dL   Calcium 8.8 (L) 8.9 - 10.3 mg/dL   Total Protein 7.0 6.5 - 8.1 g/dL   Albumin 2.4 (L) 3.5 - 5.0 g/dL   AST 19 15 - 41 U/L   ALT 13 0 - 44 U/L   Alkaline Phosphatase 101 38 - 126 U/L   Total Bilirubin 0.5 0.0 - 1.2 mg/dL   GFR, Estimated >81 >19 mL/min    Comment: (NOTE) Calculated using the CKD-EPI Creatinine Equation (2021)    Anion gap 13 5 - 15    Comment: Performed at [facility], [address], [place], [place] [address]  CBC     Status: Abnormal   Collection Time: 04/01/24  2:51 PM  Result Value Ref Range   WBC 14.5 (H) 4.0 - 10.5 K/uL   RBC 4.88 3.87 - 5.11 MIL/uL   Hemoglobin 13.4 12.0 - 15.0 g/dL   HCT 95.6 21.3 - 08.6 %   MCV 83.4 80.0 - 100.0 fL   MCH 27.5 26.0 - 34.0 pg   MCHC 32.9 30.0 - 36.0 g/dL   RDW 57.8 46.9 - 62.9 %   Platelets 258 150 - 400 K/uL   nRBC 0.0 0.0 - 0.2 %    Comment: Performed at [facility], [address], [place], [place]  [address]  Hemoglobin A1c     Status: Abnormal   Collection Time: 04/01/24  2:52 PM  Result Value Ref Range   Hgb A1c MFr Bld 9.7 (H) 4.8 - 5.6 %    Comment: (NOTE) Pre diabetes:          5.7%-6.4%  Diabetes:              >  6.4%  Glycemic control for   <7.0% adults with diabetes    Mean Plasma Glucose 231.69 mg/dL    Comment: Performed at [facility], [address] [place], [place] [address]  CBG monitoring, ED     Status: Abnormal   Collection Time: 04/01/24  3:02 PM  Result Value Ref Range   Glucose-Capillary 256 (H) 70 - 99 mg/dL    Comment: Glucose reference range applies only to samples taken after fasting for at least 8 hours.  Lipase, blood     Status: None   Collection Time: 04/01/24  3:21 PM  Result Value Ref Range   Lipase 25 11 - 51 U/L    Comment: Performed at [facility], [address], [place], [place] [address]  TSH     Status: None   Collection Time: 04/01/24  3:21 PM  Result Value Ref Range   TSH 3.035 0.350 - 4.500 uIU/mL    Comment: Performed by a 3rd Generation assay with a functional sensitivity of <=0.01 uIU/mL. Performed at [facility], [address] [place], [place] [address]   Magnesium      Status: None   Collection Time: 04/01/24  3:21 PM  Result Value Ref Range   Magnesium  1.9 1.7 - 2.4 mg/dL    Comment: Performed at [facility], [address] [place], [place] [address]  Lactic acid, plasma     Status: None   Collection Time: 04/01/24  4:18 PM  Result Value Ref Range   Lactic Acid, Venous 1.4 0.5 - 1.9 mmol/L    Comment: Performed at [facility], [address], [place], [place] [address]  Phosphorus     Status: None   Collection Time: 04/01/24  4:18 PM  Result Value Ref Range   Phosphorus 3.5 2.5 - 4.6 mg/dL    Comment: Performed at [facility], [address], [place], [place] [address]  Ammonia     Status: None   Collection Time: 04/01/24  4:21 PM  Result Value Ref Range   Ammonia 16 9 - 35  umol/L    Comment: Performed at [facility], [address], [place], [place] [address]  Resp panel by RT-PCR (RSV, Flu A&B, Covid) Anterior Nasal Swab     Status: None   Collection Time: 04/01/24  4:33 PM   Specimen: Anterior Nasal Swab  Result Value Ref Range   SARS Coronavirus 2 by RT PCR NEGATIVE NEGATIVE   Influenza A by PCR NEGATIVE NEGATIVE   Influenza B by PCR NEGATIVE NEGATIVE    Comment: (NOTE) The Xpert Xpress SARS-CoV-2/FLU/RSV plus assay is intended as an aid in the diagnosis of influenza from Nasopharyngeal swab specimens and should not be used as a sole basis for treatment. Nasal washings and aspirates are unacceptable for Xpert Xpress SARS-CoV-2/FLU/RSV testing.  Fact Sheet for Patients: BloggerCourse.com  Fact Sheet for Healthcare Providers: SeriousBroker.it  This test is not yet approved or cleared by the [place]  FDA and has been authorized for detection and/or diagnosis of SARS-CoV-2 by FDA under an Emergency Use Authorization (EUA). This EUA will remain in effect (meaning this test can be used) for the duration of the COVID-19 declaration under Section 564(b)(1) of the Act, 21 U.S.C. section 360bbb-3(b)(1), unless the authorization is terminated or revoked.     Resp Syncytial Virus by PCR NEGATIVE NEGATIVE    Comment: (NOTE) Fact Sheet for Patients: BloggerCourse.com  Fact Sheet for Healthcare  Providers: SeriousBroker.it  This test is not yet approved or cleared by the [place]  FDA and has been authorized for detection and/or diagnosis of SARS-CoV-2 by FDA under an Emergency Use Authorization (EUA). This EUA will remain in effect (meaning this test can be used) for the duration of the COVID-19 declaration under Section 564(b)(1) of the Act, 21 U.S.C. section 360bbb-3(b)(1), unless the authorization is terminated or revoked.  Performed  at [facility], [address], [place], [place] [address]   Lactic acid, plasma     Status: None   Collection Time: 04/01/24 11:00 PM  Result Value Ref Range   Lactic Acid, Venous 1.0 0.5 - 1.9 mmol/L    Comment: Performed at [facility], [address], [place], [place] [address]  Culture, blood (Routine X 2) w Reflex to ID Panel     Status: None (Preliminary result)   Collection Time: 04/01/24 11:00 PM   Specimen: BLOOD  Result Value Ref Range   Specimen Description BLOOD SITE NOT SPECIFIED    Special Requests      BOTTLES DRAWN AEROBIC AND ANAEROBIC Blood Culture results may not be optimal due to an inadequate volume of blood received in culture bottles   Culture      NO GROWTH < 12 HOURS Performed at [facility], [address], [place], [place] [address]    Report Status PENDING   Culture, blood (Routine X 2) w Reflex to ID Panel     Status: None (Preliminary result)   Collection Time: 04/01/24 11:15 PM   Specimen: BLOOD  Result Value Ref Range   Specimen Description BLOOD SITE NOT SPECIFIED    Special Requests      BOTTLES DRAWN AEROBIC AND ANAEROBIC Blood Culture results may not be optimal due to an inadequate volume of blood received in culture bottles   Culture      NO GROWTH < 12 HOURS Performed at [facility], [address], [place], [place] [address]    Report Status PENDING   CBG monitoring, ED     Status: Abnormal   Collection Time: 04/02/24 12:26 AM  Result Value Ref Range   Glucose-Capillary 220 (H) 70 - 99 mg/dL    Comment: Glucose reference range applies only to samples taken after fasting for at least 8 hours.  CBG monitoring, ED     Status: Abnormal   Collection Time: 04/02/24  1:37 AM  Result Value Ref Range   Glucose-Capillary 184 (H) 70 - 99 mg/dL    Comment: Glucose reference range applies only to samples taken after fasting for at least 8 hours.   Comment 1 Notify RN   CBG monitoring, ED     Status: Abnormal    Collection Time: 04/02/24  4:59 AM  Result Value Ref Range   Glucose-Capillary 247 (H) 70 - 99 mg/dL    Comment: Glucose reference range applies only to samples taken after fasting for at least 8 hours.  APTT     Status: None   Collection Time: 04/02/24  5:12 AM  Result Value Ref Range   aPTT 30 24 - 36 seconds    Comment: Performed at [facility], [address], [place], [place] [address]  Protime-INR     Status: None   Collection Time: 04/02/24  5:12 AM  Result Value Ref Range   Prothrombin Time 15.0 11.4 - 15.2 seconds   INR 1.2 0.8 - 1.2    Comment: (  NOTE) INR goal varies based on device and disease states. Performed at [facility], [address], [place], [place] [address]   Basic metabolic panel     Status: Abnormal   Collection Time: 04/02/24  5:12 AM  Result Value Ref Range   Sodium 133 (L) 135 - 145 mmol/L   Potassium 3.9 3.5 - 5.1 mmol/L   Chloride 98 98 - 111 mmol/L   CO2 26 22 - 32 mmol/L   Glucose, Bld 258 (H) 70 - 99 mg/dL    Comment: Glucose reference range applies only to samples taken after fasting for at least 8 hours.   BUN 24 (H) 8 - 23 mg/dL   Creatinine, Ser 6.04 0.44 - 1.00 mg/dL   Calcium 8.2 (L) 8.9 - 10.3 mg/dL   GFR, Estimated >54 >09 mL/min    Comment: (NOTE) Calculated using the CKD-EPI Creatinine Equation (2021)    Anion gap 9 5 - 15    Comment: Performed at [facility], [address], [place], [place] [address]  CBC     Status: Abnormal   Collection Time: 04/02/24  5:12 AM  Result Value Ref Range   WBC 9.5 4.0 - 10.5 K/uL   RBC 3.90 3.87 - 5.11 MIL/uL   Hemoglobin 10.8 (L) 12.0 - 15.0 g/dL   HCT 47.8 (L) 29.5 - 62.1 %   MCV 83.3 80.0 - 100.0 fL   MCH 27.7 26.0 - 34.0 pg   MCHC 33.2 30.0 - 36.0 g/dL   RDW 30.8 65.7 - 84.6 %   Platelets 194 150 - 400 K/uL   nRBC 0.0 0.0 - 0.2 %    Comment: Performed at [facility], [address] [place], [place] [address]  Cortisol     Status: None   Collection Time:  04/02/24  5:12 AM  Result Value Ref Range   Cortisol, Plasma 24.4 ug/dL    Comment: (NOTE) AM    6.7 - 22.6 ug/dL PM   <28.4       ug/dL Performed at [facility], [address], [place], [place] [address]   Troponin I (High Sensitivity)     Status: Abnormal   Collection Time: 04/02/24  5:12 AM  Result Value Ref Range   Troponin I (High Sensitivity) 252 (HH) <18 ng/L    Comment: CRITICAL RESULT CALLED TO, READ BACK BY AND VERIFIED WITH IRWIN,D RN @ [phone] 04/02/24 LEONARD,A (NOTE) Elevated high sensitivity troponin I (hsTnI) values and significant  changes across serial measurements may suggest ACS but many other  chronic and acute conditions are known to elevate hsTnI results.  Refer to the "Links" section for chest pain algorithms and additional  guidance. Performed at [facility], [address], [place], [place] [address]   Glucose, capillary     Status: Abnormal   Collection Time: 04/02/24  9:05 AM  Result Value Ref Range   Glucose-Capillary 263 (H) 70 - 99 mg/dL    Comment: Glucose reference range applies only to samples taken after fasting for at least 8 hours.  Glucose, capillary     Status: Abnormal   Collection Time: 04/02/24 12:39 PM  Result Value Ref Range   Glucose-Capillary 211 (H) 70 - 99 mg/dL    Comment: Glucose reference range applies only to samples taken after fasting for at least 8 hours.  Glucose, capillary     Status: Abnormal   Collection Time: 04/02/24  4:48 PM  Result Value  Ref Range   Glucose-Capillary 258 (H) 70 - 99 mg/dL    Comment: Glucose reference range applies only to samples taken after fasting for at least 8 hours.  Glucose, capillary     Status: Abnormal   Collection Time: 04/02/24  7:58 PM  Result Value Ref Range   Glucose-Capillary 202 (H) 70 - 99 mg/dL    Comment: Glucose reference range applies only to samples taken after fasting for at least 8 hours.  Glucose, capillary     Status: Abnormal   Collection Time: 04/03/24  12:26 AM  Result Value Ref Range   Glucose-Capillary 177 (H) 70 - 99 mg/dL    Comment: Glucose reference range applies only to samples taken after fasting for at least 8 hours.  Glucose, capillary     Status: Abnormal   Collection Time: 04/03/24  6:18 AM  Result Value Ref Range   Glucose-Capillary 66 (L) 70 - 99 mg/dL    Comment: Glucose reference range applies only to samples taken after fasting for at least 8 hours.  Glucose, capillary     Status: Abnormal   Collection Time: 04/03/24  6:59 AM  Result Value Ref Range   Glucose-Capillary 101 (H) 70 - 99 mg/dL    Comment: Glucose reference range applies only to samples taken after fasting for at least 8 hours.  Glucose, capillary     Status: Abnormal   Collection Time: 04/03/24  7:30 AM  Result Value Ref Range   Glucose-Capillary 174 (H) 70 - 99 mg/dL    Comment: Glucose reference range applies only to samples taken after fasting for at least 8 hours.    DG ESOPHAGUS W SINGLE CM (SOL OR THIN BA) Result Date: 04/02/2024 CLINICAL DATA:  83 year old female who presented to the ED on 04/01/2024 after being found unresponsive due to choking on one of her pills. Patient with a history of chronic difficulty swallowing and increasing number of episodes of choking over the past several weeks. Request for esophagram. EXAM: ESOPHAGUS/BARIUM SWALLOW/TABLET STUDY TECHNIQUE: Single contrast examination was performed using thin liquid barium. This exam was performed by Estella Helling, PA-C, and was supervised and interpreted by Dr. Wilnette Haste. FLUOROSCOPY: Radiation Exposure Index (as provided by the fluoroscopic device): 5.2 mGy Kerma COMPARISON:  DG SWALLOW FUNC MEDICARE - 04/07/24. FINDINGS: Swallowing: Appears normal. No vestibular penetration or aspiration seen. Pharynx: Unremarkable. Esophagus: No visible mucosal abnormalities. Narrowing of the upper esophagus roughly at the level of C7-T1, although difficult to fully view due to hardware. Possible  esophageal diverticulum in the area of the gastroesophageal junction. Esophageal motility: Mild dysmotility with intermittent tertiary contractions. Hiatal Hernia: None. Gastroesophageal reflux: None visualized. Ingested 13mm barium tablet: Patient refused to swallow tablet whole. Other: None. IMPRESSION: Mild esophageal dysmotility Small epiphrenic diverticulum. Electronically Signed   By: Myrlene Asper D.O.   On: 04/02/2024 15:59   DG Thoracic Spine 2 View Result Date: 04/01/2024 CLINICAL DATA:  Back pain EXAM: THORACIC SPINE 2 VIEWS; LUMBAR SPINE - 2-3 VIEW COMPARISON:  CT chest abdomen pelvis 01/18/2023 FINDINGS: Right convex thoracic and left convex lumbar curve. Demineralization and overlapping soft tissue limits assessment for subtle fractures. Compression fracture of T12 with 50% vertebral body height loss is new since 01/18/2023. Advanced lumbar spondylosis and facet arthropathy. IMPRESSION: Compression fracture of T12 is age indeterminate but new since 01/18/2023. Electronically Signed   By: Rozell Cornet M.D.   On: 04/01/2024 20:29   DG Lumbar Spine 2-3 Views Result Date: 04/01/2024 CLINICAL DATA:  Back  pain EXAM: THORACIC SPINE 2 VIEWS; LUMBAR SPINE - 2-3 VIEW COMPARISON:  CT chest abdomen pelvis 01/18/2023 FINDINGS: Right convex thoracic and left convex lumbar curve. Demineralization and overlapping soft tissue limits assessment for subtle fractures. Compression fracture of T12 with 50% vertebral body height loss is new since 01/18/2023. Advanced lumbar spondylosis and facet arthropathy. IMPRESSION: Compression fracture of T12 is age indeterminate but new since 01/18/2023. Electronically Signed   By: Rozell Cornet M.D.   On: 04/01/2024 20:29   DG Chest Portable 1 View Result Date: 04/01/2024 CLINICAL DATA:  hypoxia to 60's, possible aspiration, AMS. EXAM: PORTABLE CHEST 1 VIEW COMPARISON:  12/04/2023. FINDINGS: There are nonspecific opacities in the right para cardiac region, which may  represent pneumonitis versus atelectasis and can be due to suspected aspiration. Bilateral lung fields are otherwise clear. Bilateral costophrenic angles are clear. Stable cardio-mediastinal silhouette. Mitral annulus calcifications noted. 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 No acute osseous abnormalities. Subacute/healing right posterior rib fractures noted. Right shoulder arthroplasty noted. There is cervicothoracic spinal fixation hardware. The soft tissues are within normal limits. IMPRESSION: *Nonspecific opacities in the right para cardiac region, which may represent pneumonitis versus atelectasis and can be due to suspected aspiration. Electronically Signed   By: Beula Brunswick M.D.   On: 04/01/2024 16:23   CT HEAD WO CONTRAST ( ) Result Date: 04/01/2024 CLINICAL DATA:  Altered mental status. EXAM: CT HEAD WITHOUT CONTRAST TECHNIQUE: Contiguous axial images were obtained from the base of the skull through the vertex without intravenous contrast. RADIATION DOSE REDUCTION: This exam was performed according to the departmental dose-optimization program which includes automated exposure control, adjustment of the mA and/or kV according to patient size and/or use of iterative reconstruction technique. COMPARISON:  February 10, 2024 FINDINGS: Brain: There is generalized cerebral atrophy with widening of the extra-axial spaces and ventricular dilatation. There are areas of decreased attenuation within the white matter tracts of the supratentorial brain, consistent with microvascular disease changes. Stable areas of bilateral anterior temporal lobe and left frontal lobe encephalomalacia are noted. Vascular: Marked severity bilateral cavernous carotid artery calcification is seen. Skull: Normal. Negative for fracture or focal lesion. Sinuses/Orbits: There is marked severity left maxillary sinus, left-sided sphenoid sinus and right-sided frontal sinus mucosal thickening. Mild  bilateral maxillary sinus mucosal thickening is also noted. Other: None. IMPRESSION: 1. Generalized cerebral atrophy with widening of the extra-axial spaces and ventricular dilatation. 2. Stable areas of bilateral anterior temporal lobe and left frontal lobe encephalomalacia. 3. Marked severity left maxillary sinus, left-sided sphenoid sinus and right-sided frontal sinus mucosal thickening. Electronically Signed   By: Virgle Grime M.D.   On: 04/01/2024 16:04    ROS negative except above Blood pressure (!) 117/59, pulse 77, temperature (!) 97.5 F (36.4 C), temperature source Oral, resp. rate 16, height 5' (1.524 m), weight 39.5 kg, SpO2 99%. Physical Exam small frail elderly no acute distress abdomen is soft nontender barium swallow reviewed possible tiny GE junction diverticuli which goes along with soft GL dysmotility but that does not seem to be playing a role with her dysphagia  Assessment/Plan: Longstanding oropharyngeal dysphagia Plan: Do not believe an endoscopy will help her current problem and await speech therapy final opinion but certainly if diverticulum becomes worse or signs of more esophageal dysphagia and not oropharyngeal develop happy to see back and please call me if I can be of any further assistance with this hospital stay  [facility] E 04/03/2024, 10:05 AM

## 2024-04-03 NOTE — Progress Notes (Signed)
 PROGRESS NOTE  Danielle Clarke ZOX:[id number] DOB: May 06, 1941 DOA: 04/01/2024 PCP: Elester Grim, MD   LOS: 2 days   Brief Narrative / Interim history: 83 year old female with history of HTN, HLD, AAS status post TAVR 2024, DM 1 who comes into the hospital with an acute choking episode and unresponsiveness.  Apparently she has been having chronic trouble swallowing with increased episodes of choking over the last several weeks.  She apparently was eating, she was noted to be coughing, daughter left her little bit but then when she returned patient was unresponsive.  EMS was called and she was found to be hypoxic into the 60s, requiring nonrebreather.  She was brought to the ER.  Of note, she has been having shingles and varicella of the right hemithorax for the past couple of weeks, placed on antivirals, muscle relaxants and pain medications.  Subjective / 24h Interval events: Overall feels well, no abdominal pain, no nausea or vomiting this morning  Assesement and Plan: Principal Problem:   Loss of consciousness (HCC) Active Problems:   Diabetes mellitus type 1 (HCC)   Essential hypertension, benign   Choking   Acute hypoxic respiratory failure (HCC)   Compression fracture of T12 vertebra (HCC)   Shingles  Principal problem Syncope -unclear circumstances, possibly after episode of choking and altered breathing.  She was found to be profoundly hypoxic initially required nonrebreather, but rapidly weaned off to 2 L upon her waking up.  She did have some ST segment changes, cardiology consulted and evaluated patient, did not felt this was significant and appeared chronic.  Active problems Dysphagia, difficulty swallowing -underwent a barium swallow 5/11 which showed mild esophageal dysmotility, small epinephric diverticulum and possible narrowing of the upper esophagus but difficult to fully view due to hardware.  Gastroenterology consulted, evaluated patient, and do not believe that an  endoscopy will have her current problem - Speech to see again today  Shingles -no active vesicles at this time.  Continue antivirals  T12 compression fracture-admitting MD discussed with neurosurgery.  For now TLSO brace while out of bed.  PT consulted, awaiting evaluation  DM1-continue glargine, sliding scale  Essential hypertension-blood pressure stable, monitor off home losartan   Acute hypoxic respiratory failure due to aspiration pneumonia-initially required nonrebreather, now down to 2 L.  Likely due to aspiration pneumonia.  Has been placed on antibiotics  Scheduled Meds:  acyclovir  800 mg Oral 5 X Daily   aspirin  EC  81 mg Oral Daily   enoxaparin  (LOVENOX ) injection  20 mg Subcutaneous Q24H   insulin  aspart  0-9 Units Subcutaneous Q4H   insulin  glargine-yfgn  10 Units Subcutaneous Daily   megestrol  20 mg Oral BID   senna-docusate  2 tablet Oral BID   sodium chloride  flush  3 mL Intravenous Q12H   thiamine (VITAMIN B1) injection  100 mg Intravenous Daily   Continuous Infusions:  cefTRIAXone (ROCEPHIN)  IV Stopped (04/02/24 1352)   PRN Meds:.acetaminophen  **OR** acetaminophen , oxyCODONE , polyethylene glycol  Current Outpatient Medications  Medication Instructions   aspirin  EC 81 mg, Oral, Daily, Swallow whole.   azithromycin  (ZITHROMAX ) 500 mg, Oral, As needed, Take one tablet by mouth one hour prior to dental cleanings.   Continuous Glucose Receiver (DEXCOM G7 RECEIVER) DEVI See admin instructions   Continuous Glucose Sensor (DEXCOM G7 SENSOR) MISC    HumaLOG KwikPen 0-10 Units, Subcutaneous, 3 times daily PRN, Sliding Scale   insulin  glargine (LANTUS ) 13 Units, Subcutaneous, Daily   losartan  (COZAAR ) 12.5 mg, Oral, Daily  at bedtime   megestrol (MEGACE) 20 mg, Oral, 2 times daily   Menthol -Methyl Salicylate (SALONPAS PAIN RELIEF PATCH) PTCH 1 patch, Apply externally, Daily PRN   methocarbamol (ROBAXIN) 750 mg, Oral, 4 times daily   metoprolol  tartrate (LOPRESSOR ) 25  mg, Oral, As directed   protein supplement shake (PREMIER PROTEIN) LIQD 11 oz, Oral, 2 times daily, 30 mg of protein   simvastatin  (ZOCOR ) 40 mg, Oral, Daily at bedtime   traMADol  (ULTRAM ) 50 mg, Oral, Every 6 hours PRN   valACYclovir (VALTREX) 1,000 mg, Oral, 2 times daily    Diet Orders (From admission, onward)     Start     Ordered   04/02/24 1458  DIET - DYS 1 Room service appropriate? Yes; Fluid consistency: Thin  Diet effective now       Question Answer Comment  Room service appropriate? Yes   Fluid consistency: Thin      04/02/24 1457            DVT prophylaxis: SCDs Start: 04/01/24 1904   Lab Results  Component Value Date   PLT 194 04/02/2024      Code Status: Full Code  Family Communication: Daughter at bedside  Status is: Inpatient Remains inpatient appropriate because: severity of illness  Level of care: Telemetry Medical  Consultants:  Neurosurgery   Objective: Vitals:   04/02/24 1714 04/02/24 2001 04/03/24 0435 04/03/24 0728  BP: 127/79 (!) 132/49 125/60 (!) 117/59  Pulse: 92 90 81 77  Resp: 18 15 16    Temp:  99.6 F (37.6 C) (!) 97.4 F (36.3 C) (!) 97.5 F (36.4 C)  TempSrc:   Oral Oral  SpO2: 99% 98% 100% 99%  Weight:      Height:        Intake/Output Summary (Last 24 hours) at 04/03/2024 1015 Last data filed at 04/02/2024 1920 Gross per 24 hour  Intake 997.03 ml  Output 50 ml  Net 947.03 ml   Wt Readings from Last 3 Encounters:  04/02/24 39.5 kg  02/15/24 45.3 kg  02/10/24 (S) 46.2 kg    Examination:  Constitutional: NAD Eyes: lids and conjunctivae normal, no scleral icterus ENMT: mmm Neck: normal, supple Respiratory: clear to auscultation bilaterally, no wheezing, no crackles.  Cardiovascular: Regular rate and rhythm, no murmurs / rubs / gallops. Abdomen: soft, no distention, no tenderness. Bowel sounds positive.    Data Reviewed: I have independently reviewed following labs and imaging studies   CBC Recent Labs  Lab  04/01/24 1451 04/02/24 0512  WBC 14.5* 9.5  HGB 13.4 10.8*  HCT 40.7 32.5*  PLT 258 194  MCV 83.4 83.3  MCH 27.5 27.7  MCHC 32.9 33.2  RDW 14.1 14.3    Recent Labs  Lab 04/01/24 1451 04/01/24 1452 04/01/24 1521 04/01/24 1618 04/01/24 1621 04/01/24 2300 04/02/24 0512  NA 130*  --   --   --   --   --  133*  K 4.0  --   --   --   --   --  3.9  CL 91*  --   --   --   --   --  98  CO2 26  --   --   --   --   --  26  GLUCOSE 270*  --   --   --   --   --  258*  BUN 32*  --   --   --   --   --  24*  CREATININE 0.78  --   --   --   --   --  0.73  CALCIUM 8.8*  --   --   --   --   --  8.2*  AST 19  --   --   --   --   --   --   ALT 13  --   --   --   --   --   --   ALKPHOS 101  --   --   --   --   --   --   BILITOT 0.5  --   --   --   --   --   --   ALBUMIN 2.4*  --   --   --   --   --   --   MG  --   --  1.9  --   --   --   --   LATICACIDVEN  --   --   --  1.4  --  1.0  --   INR  --   --   --   --   --   --  1.2  TSH  --   --  3.035  --   --   --   --   HGBA1C  --  9.7*  --   --   --   --   --   AMMONIA  --   --   --   --  16  --   --     ------------------------------------------------------------------------------------------------------------------ No results for input(s): "CHOL", "HDL", "LDLCALC", "TRIG", "CHOLHDL", "LDLDIRECT" in the last 72 hours.  Lab Results  Component Value Date   HGBA1C 9.7 (H) 04/01/2024   ------------------------------------------------------------------------------------------------------------------ Recent Labs    04/01/24 1521  TSH 3.035    Cardiac Enzymes No results for input(s): "CKMB", "TROPONINI", "MYOGLOBIN" in the last 168 hours.  Invalid input(s): "CK" ------------------------------------------------------------------------------------------------------------------    Component Value Date/Time   BNP 302.4 (H) 08/06/2017 0441    CBG: Recent Labs  Lab 04/02/24 1958 04/03/24 0026 04/03/24 0618 04/03/24 0659  04/03/24 0730  GLUCAP 202* 177* 66* 101* 174*    Recent Results (from the past 240 hours)  Resp panel by RT-PCR (RSV, Flu A&B, Covid) Anterior Nasal Swab     Status: None   Collection Time: 04/01/24  4:33 PM   Specimen: Anterior Nasal Swab  Result Value Ref Range Status   SARS Coronavirus 2 by RT PCR NEGATIVE NEGATIVE Final   Influenza A by PCR NEGATIVE NEGATIVE Final   Influenza B by PCR NEGATIVE NEGATIVE Final    Comment: (NOTE) The Xpert Xpress SARS-CoV-2/FLU/RSV plus assay is intended as an aid in the diagnosis of influenza from Nasopharyngeal swab specimens and should not be used as a sole basis for treatment. Nasal washings and aspirates are unacceptable for Xpert Xpress SARS-CoV-2/FLU/RSV testing.  Fact Sheet for Patients: BloggerCourse.com  Fact Sheet for Healthcare Providers: SeriousBroker.it  This test is not yet approved or cleared by the [place]  FDA and has been authorized for detection and/or diagnosis of SARS-CoV-2 by FDA under an Emergency Use Authorization (EUA). This EUA will remain in effect (meaning this test can be used) for the duration of the COVID-19 declaration under Section 564(b)(1) of the Act, 21 U.S.C. section 360bbb-3(b)(1), unless the authorization is terminated or revoked.     Resp Syncytial Virus by PCR NEGATIVE NEGATIVE Final    Comment: (NOTE) Fact Sheet for Patients: BloggerCourse.com  Fact Sheet for Healthcare Providers: SeriousBroker.it  This test is not yet approved or cleared by the [place]  FDA and has been authorized for detection and/or diagnosis of SARS-CoV-2 by FDA under an Emergency Use Authorization (EUA). This EUA will remain in effect (meaning this test can be used) for the duration of the COVID-19 declaration under Section 564(b)(1) of the Act, 21 U.S.C. section 360bbb-3(b)(1), unless the authorization is  terminated or revoked.  Performed at [facility], [address] [place], [place] [address]   Culture, blood (Routine X 2) w Reflex to ID Panel     Status: None (Preliminary result)   Collection Time: 04/01/24 11:00 PM   Specimen: BLOOD  Result Value Ref Range Status   Specimen Description BLOOD SITE NOT SPECIFIED  Final   Special Requests   Final    BOTTLES DRAWN AEROBIC AND ANAEROBIC Blood Culture results may not be optimal due to an inadequate volume of blood received in culture bottles   Culture   Final    NO GROWTH < 12 HOURS Performed at [facility], [address] [place], [place] [address]    Report Status PENDING  Incomplete  Culture, blood (Routine X 2) w Reflex to ID Panel     Status: None (Preliminary result)   Collection Time: 04/01/24 11:15 PM   Specimen: BLOOD  Result Value Ref Range Status   Specimen Description BLOOD SITE NOT SPECIFIED  Final   Special Requests   Final    BOTTLES DRAWN AEROBIC AND ANAEROBIC Blood Culture results may not be optimal due to an inadequate volume of blood received in culture bottles   Culture   Final    NO GROWTH < 12 HOURS Performed at [facility], [address] [place], [place] [address]    Report Status PENDING  Incomplete     Radiology Studies: DG ESOPHAGUS W SINGLE CM (SOL OR THIN BA) Result Date: 04/02/2024 CLINICAL DATA:  83 year old female who presented to the ED on 04/01/2024 after being found unresponsive due to choking on one of her pills. Patient with a history of chronic difficulty swallowing and increasing number of episodes of choking over the past several weeks. Request for esophagram. EXAM: ESOPHAGUS/BARIUM SWALLOW/TABLET STUDY TECHNIQUE: Single contrast examination was performed using thin liquid barium. This exam was performed by Estella Helling, PA-C, and was supervised and interpreted by Dr. Wilnette Haste. FLUOROSCOPY: Radiation Exposure Index (as provided by the fluoroscopic device): 5.2 mGy  Kerma COMPARISON:  DG SWALLOW FUNC MEDICARE - 04/07/24. FINDINGS: Swallowing: Appears normal. No vestibular penetration or aspiration seen. Pharynx: Unremarkable. Esophagus: No visible mucosal abnormalities. Narrowing of the upper esophagus roughly at the level of C7-T1, although difficult to fully view due to hardware. Possible esophageal diverticulum in the area of the gastroesophageal junction. Esophageal motility: Mild dysmotility with intermittent tertiary contractions. Hiatal Hernia: None. Gastroesophageal reflux: None visualized. Ingested 13mm barium tablet: Patient refused to swallow tablet whole. Other: None. IMPRESSION: Mild esophageal dysmotility Small epiphrenic diverticulum. Electronically Signed   By: Myrlene Asper D.O.   On: 04/02/2024 15:59     Kathlen Para, MD, PhD [facility]  Between 7 am - 7 pm I am available, please contact me via Amion (for emergencies) or Securechat (non urgent messages)  Between 7 pm - 7 am I am not available, please contact night coverage MD/APP via Amion

## 2024-04-03 NOTE — Discharge Instructions (Signed)

## 2024-04-03 NOTE — Evaluation (Signed)
 Physical Therapy Evaluation Patient Details Name: Danielle Clarke MRN: [id number] DOB: September 12, 1941 Today's Date: 04/03/2024  History of Present Illness  Pt is 83 yo female who presents on 04/01/24 with AMS/ unresponsiveness from home. Recently had shingles. Noted respiratory failure possibly due to aspiration. T12 compression fx. PMH: dysphagia, CAD, TAVR, bradycardia with PPM placement, HTN, HLD, DM1, ICH  Clinical Impression  Pt admitted with above diagnosis. Pt from home but has been having functional decline over past 2 weeks since shingles. Pt was ambulatory before that time with daughter and caregiver. Currently pt has pain from shingles as well as complaining of R elbow pain and generalized pain. She needed max A to come to EOB and could maintain sitting only a few minutes before returning to supine with max A because of pain. Pt and daughter educated in use of TLSO and taught how to don/ doff for when mobility is possible. Pt with significant decline in functional mobility compared to her baseline. Patient will benefit from continued inpatient follow up therapy, <3 hours/day.  Pt currently with functional limitations due to the deficits listed below (see PT Problem List). Pt will benefit from acute skilled PT to increase their independence and safety with mobility to allow discharge.           If plan is discharge home, recommend the following: Two people to help with walking and/or transfers;A lot of help with bathing/dressing/bathroom;Assistance with feeding;Help with stairs or ramp for entrance;Assist for transportation   Can travel by private vehicle   No    Equipment Recommendations None recommended by PT  Recommendations for Other Services  OT consult    Functional Status Assessment Patient has had a recent decline in their functional status and demonstrates the ability to make significant improvements in function in a reasonable and predictable amount of time.     Precautions  / Restrictions Precautions Precautions: Fall Restrictions Weight Bearing Restrictions Per Provider Order: No      Mobility  Bed Mobility Overal bed mobility: Needs Assistance Bed Mobility: Supine to Sit, Sit to Supine     Supine to sit: Max assist Sit to supine: Max assist   General bed mobility comments: max A to come to EOB from elevated HOB. Pt able to initiate but cannot complete task due to pain and weakness. Max A to return to supine    Transfers                   General transfer comment: pt unable to tolerate attempt to stand    Ambulation/Gait               General Gait Details: unable  Stairs            Wheelchair Mobility     Tilt Bed    Modified Rankin (Stroke Patients Only)       Balance Overall balance assessment: Needs assistance Sitting-balance support: Feet unsupported, Bilateral upper extremity supported Sitting balance-Leahy Scale: Poor Sitting balance - Comments: needed min A to maintain sitting and tolerated only 5 mins before needing to return to supine Postural control: Right lateral lean                                   Pertinent Vitals/Pain Pain Assessment Pain Assessment: Faces Faces Pain Scale: Hurts whole lot Pain Location: generalized with mvmt Pain Descriptors / Indicators: Aching Pain Intervention(s): Limited activity within patient's  tolerance, Monitored during session    Home Living Family/patient expects to be discharged to:: Private residence Living Arrangements: Alone Available Help at Discharge: Family;Personal care attendant (6 days/wk, 4-6 hrs) Type of Home: House Home Access: Stairs to enter Entrance Stairs-Rails: Left Entrance Stairs-Number of Steps: 2   Home Layout: One level Home Equipment: Shower seat;Grab bars - tub/shower;Rolling Walker (2 wheels);Cane - single point;Wheelchair - manual;BSC/3in1 Additional Comments: daughter lives next door, is a Financial controller so  intermittently. Pt loves her cat ([place])    Prior Function Prior Level of Function : Needs assist             Mobility Comments: 1-2 weeks since she could walk because of pain. Prior likes to go shopping with her aide ADLs Comments: daughter helps with bathing     Extremity/Trunk Assessment   Upper Extremity Assessment Upper Extremity Assessment: Defer to OT evaluation;Generalized weakness    Lower Extremity Assessment Lower Extremity Assessment: Generalized weakness;LLE deficits/detail LLE Deficits / Details: h/o L ankle sx with ~2" LLD, has a heel lift    Cervical / Trunk Assessment Cervical / Trunk Assessment: Other exceptions Cervical / Trunk Exceptions: scoliosis  Communication   Communication Communication: No apparent difficulties    Cognition Arousal: Lethargic Behavior During Therapy: WFL for tasks assessed/performed   PT - Cognitive impairments: Difficult to assess Difficult to assess due to: Level of arousal                     PT - Cognition Comments: in tact for basic conversation but pt falling asleep at times Following commands: Intact       Cueing Cueing Techniques: Verbal cues     General Comments General comments (skin integrity, edema, etc.): pt on 2L O2, was not on O2 PTA. Daughter present    Exercises     Assessment/Plan    PT Assessment Patient needs continued PT services  PT Problem List Decreased strength;Decreased range of motion;Decreased activity tolerance;Decreased balance;Decreased mobility;Decreased coordination;Decreased cognition;Decreased knowledge of use of DME;Decreased safety awareness;Decreased knowledge of precautions;Pain;Decreased skin integrity;Cardiopulmonary status limiting activity       PT Treatment Interventions DME instruction;Gait training;Functional mobility training;Therapeutic activities;Therapeutic exercise;Balance training;Cognitive remediation;Neuromuscular re-education;Patient/family education     PT Goals (Current goals can be found in the Care Plan section)  Acute Rehab PT Goals Patient Stated Goal: get better and return home to cat PT Goal Formulation: With patient/family Time For Goal Achievement: 04/17/24 Potential to Achieve Goals: Fair    Frequency Min 2X/week     Co-evaluation               AM-PAC PT "6 Clicks" Mobility  Outcome Measure Help needed turning from your back to your side while in a flat bed without using bedrails?: Total Help needed moving from lying on your back to sitting on the side of a flat bed without using bedrails?: Total Help needed moving to and from a bed to a chair (including a wheelchair)?: Total Help needed standing up from a chair using your arms (e.g., wheelchair or bedside chair)?: Total Help needed to walk in hospital room?: Total Help needed climbing 3-5 steps with a railing? : Total 6 Click Score: 6    End of Session Equipment Utilized During Treatment: Oxygen Activity Tolerance: Patient limited by pain;Patient limited by lethargy Patient left: in bed;with call bell/phone within reach;with bed alarm set;with family/visitor present Nurse Communication: Mobility status PT Visit Diagnosis: Muscle weakness (generalized) (M62.81);Difficulty in walking, not elsewhere classified (  R26.2);Pain Pain - part of body: Shoulder;Ankle and joints of foot;Arm (torso (shingles))    Time: 1610-9604 PT Time Calculation (min) (ACUTE ONLY): 52 min   Charges:   PT Evaluation $PT Eval Moderate Complexity: 1 Mod PT Treatments $Therapeutic Activity: 23-37 mins PT General Charges $$ ACUTE PT VISIT: 1 Visit         Amey Ka, PT  Acute Rehab Services Secure chat preferred Office [phone]   Deloris Fetters Orvile Corona 04/03/2024, 1:34 PM

## 2024-04-03 NOTE — TOC Initial Note (Signed)
 Transition of Care [facility]) - Initial/Assessment Note    Patient Details  Name: Danielle Clarke MRN: [id number] Date of Birth: March 06, 1941  Transition of Care [facility]) CM/SW Contact:    Jannice Mends, LCSW Phone Number: 04/03/2024, 2:16 PM  Clinical Narrative:                 CSW received consult for possible SNF placement at time of discharge. CSW spoke with patient and daughter, Angele Barbara, at bedside. Patient's daughter reported that patient's family is currently unable to care for patient at their home given patient's current physical needs and fall risk. Patient's daughter expressed understanding of PT recommendation and is agreeable to SNF placement at time of discharge. Patient reports preference for [facility]. CSW discussed insurance authorization process and will provide Medicare SNF ratings list. CSW will send out referrals for review and provide bed offers as available.   CSW discussed with [facility] and they are unable to accept [facility] right now. Daughter reported that [facility] or [facility] are also close options for family. Daughter is going out of town on Saturday so if she is unable to be reached, alternate contacts are son, Chip [phone]) and his wife Abe Abed [phone]). Additional contact is friend, Rice Chamorro [phone]), who helps as caregiver and transports to appointments.    Skilled Nursing Rehab Facilities-   [email or web address]   Ratings out of 5 stars (5 the highest)   Name Address  Phone # Quality Care Staffing Health Inspection Overall  [facility] [address] [phone] 3 3 4 4   [facility] [address], [place] [phone] 5 1 4 4   [facility] [address], [place] [phone] 2 2 2 2   [facility] [address], [place] [phone] 5 2 4 5   [facility]  [address], [place] [phone] 4 3 5 5   [facility] [address], [facility] [phone] 4 2 2 2    [facility] [address] [place] [phone] 5 1 2 2   [facility] [address] [place] [phone] 2 1 3 2   [address] ([facility]) [address], [place] [phone] 2 3 3 3   [facility] [address] [place], [place] [phone] 3 3 2 2   [facility] ([place]) [address], [place] [phone] 3 1 1 1   [facility] [address] [place] [phone] 2 3 4 4   [facility] [address] [place] [phone] 4 4 3 3   [facility]) [address], [place] [phone] 1 2 4 3           [facility] [address] [place] [phone] 2 1 4 3   [facility] [address], [place] [phone] 4 2 1 1   [facility]  [address], [place], [place] [address] [phone] 2 2 2 2   [facility] [address] [phone] 3 2 4 4   [facility] [address], [place] [phone] 2 1 2 1   [facility] (No [facility]) [address], [place] [phone] 5 4 5 5   [facility] [address], [place] [phone] 3 4 2 2   [facility] [address], [place] [phone] 2 1 1 1   [facility] [address] [place] [phone] 4 2 5 5   [facility] [address], [place] [phone] 4 1 1 1   [facility] [address] [place], [place] [phone] 2 2 3  3  [facility]  [phone] 3 1 1 1   [facility] [address], [place]  [phone] 3 3 3 3   [facility] (No Humana) [address], [place] [phone] 2 2 4 4   [facility]) [address], [place] [phone] 2 1 1 1   [facility] [address], [place] [phone] 4 3 5 5   [facility] [place] [address], [place] [phone] 1 1 1 1           [facility] [address] [place], [place] [phone] 5 4 5 5   [facility])  [address]  [place] [phone] 1 1 2 1   [facility]) [address] [place] [phone]  2 4 4   [facility] [address] [place] [phone] 3 5 5 5   [address] [place], [place] [phone] 4 2 2 2   [facility]) [address] [place] [phone] 2 1 3 2      Expected Discharge Plan: Skilled Nursing Facility Barriers to Discharge: Continued Medical Work up, English as a second language teacher, SNF Pending bed offer   Patient Goals and CMS Choice Patient states their goals for this hospitalization and ongoing recovery are:: Rehab CMS Medicare.gov Compare Post Acute Care list provided to:: Patient Represenative (must comment)   [place] ownership interest in [facility].provided to:: Adult Children    Expected Discharge Plan and Services In-house Referral: Clinical Social Work   Post Acute Care Choice: Skilled Nursing Facility Living arrangements for the past 2 months: Single Family Home                                      Prior Living Arrangements/Services Living arrangements for the past 2 months: Single Family Home Lives with:: Self Patient language and need for interpreter reviewed:: Yes        Need for Family Participation in Patient Care: Yes (Comment) Care giver support system in place?: Yes (comment) Current home services: DME Criminal Activity/Legal Involvement Pertinent to Current Situation/Hospitalization: No - Comment as needed  Activities of Daily Living   ADL Screening (condition at time of admission) Independently performs ADLs?: Yes (appropriate for developmental age) Is the patient deaf or have difficulty hearing?: No Does the patient have difficulty seeing, even when wearing glasses/contacts?: No Does the patient have difficulty concentrating, remembering, or making decisions?: No  Permission Sought/Granted                  Emotional Assessment Appearance:: Appears  stated age Attitude/Demeanor/Rapport: Engaged Affect (typically observed): Accepting, Appropriate Orientation: : Oriented to Self, Oriented to Place Alcohol / Substance Use: Not Applicable Psych Involvement: No (comment)  Admission diagnosis:  Pneumonitis [J98.4] Hypoxia [R09.02] Choking [T17.308A] Aspiration into airway, initial encounter [T17.908A] Patient Active Problem List   Diagnosis Date Noted   Protein-calorie malnutrition, severe 04/03/2024   Choking 04/01/2024   Acute hypoxic respiratory failure (HCC) 04/01/2024   Compression fracture of T12 vertebra (HCC) 04/01/2024   Shingles 04/01/2024   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   Closed comminuted left humeral fracture 11/19/2021   Prolonged QT interval 11/19/2021   Elevated CK 11/19/2021   CKD (chronic kidney disease), stage III (HCC) 10/14/2020   Abnormality of gait 12/01/2019   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) 12/01/2019   Traumatic brain injury with loss of consciousness (  HCC) 05/28/2019   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 05/20/2019   Hypertensive crisis    Hypokalemia    Leukocytosis    Lethargy    Labile blood pressure    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    Acute blood loss anemia    Low grade fever    Hypoglycemia    Multiple closed fractures of ribs of right side    Pleural effusion on right    Hypoalbuminemia due to protein-calorie malnutrition (HCC)    Dysphagia    Traumatic cerebral intraparenchymal hematoma (HCC) 02/14/2019   Pacemaker    Labile blood glucose    Diabetes mellitus type 2 in nonobese (HCC)    Dyslipidemia    Essential hypertension    Hypernatremia    Anemia of chronic disease    Hypertensive urgency 02/07/2019   Intracerebral hemorrhage (HCC) 02/07/2019   Intracranial bleeding (HCC) 02/06/2019   Elevated troponin    Abnormal EKG    Bronchitis 08/05/2017   Bilateral carotid  artery stenosis    Aortic stenosis 08/25/2015   Cervical stenosis of spinal canal 07/31/2014   Essential hypertension, benign 12/30/2013   Hyperlipidemia 12/30/2013   Toe infection 12/30/2013   Acute renal failure (HCC) 12/29/2013   Loss of consciousness (HCC) 01/02/2013   Diabetes mellitus type 1 (HCC) 01/02/2013   Bilateral carotid artery disease (HCC) 01/02/2013   GOITER, MULTINODULAR 02/11/2008   PCP:  Elester Grim, MD Pharmacy:   [facility] [address], [place] - [address] [place] [address] Phone: [phone] Fax: [phone]     Social Drivers of Health (SDOH) Social History: SDOH Screenings   Food Insecurity: No Food Insecurity (04/02/2024)  Housing: Low Risk  (04/02/2024)  Transportation Needs: No Transportation Needs (04/02/2024)  Utilities: Not At Risk (04/02/2024)  Depression (PHQ2-9): Low Risk  (05/28/2019)  Social Connections: Patient Declined (04/02/2024)  Tobacco Use: Low Risk  (04/03/2024)   SDOH Interventions: Transportation Interventions: Inpatient TOC   Readmission Risk Interventions    04/02/2024   11:26 AM 02/07/2023   12:07 PM  Readmission Risk Prevention Plan  Post Dischage Appt  Complete  Medication Screening  Complete  Transportation Screening Complete Complete  PCP or Specialist Appt within 5-7 Days Complete   Home Care Screening Complete   Medication Review (RN CM) Referral to Pharmacy

## 2024-04-04 DIAGNOSIS — Z681 Body mass index (BMI) 19 or less, adult: Secondary | ICD-10-CM | POA: Diagnosis not present

## 2024-04-04 DIAGNOSIS — Z66 Do not resuscitate: Secondary | ICD-10-CM | POA: Diagnosis not present

## 2024-04-04 DIAGNOSIS — E1022 Type 1 diabetes mellitus with diabetic chronic kidney disease: Secondary | ICD-10-CM | POA: Diagnosis not present

## 2024-04-04 DIAGNOSIS — N189 Chronic kidney disease, unspecified: Secondary | ICD-10-CM | POA: Diagnosis not present

## 2024-04-04 DIAGNOSIS — I672 Cerebral atherosclerosis: Secondary | ICD-10-CM | POA: Diagnosis not present

## 2024-04-04 DIAGNOSIS — E119 Type 2 diabetes mellitus without complications: Secondary | ICD-10-CM | POA: Diagnosis not present

## 2024-04-04 DIAGNOSIS — T17920A Food in respiratory tract, part unspecified causing asphyxiation, initial encounter: Secondary | ICD-10-CM | POA: Diagnosis not present

## 2024-04-04 DIAGNOSIS — R5381 Other malaise: Secondary | ICD-10-CM | POA: Diagnosis not present

## 2024-04-04 DIAGNOSIS — M4802 Spinal stenosis, cervical region: Secondary | ICD-10-CM | POA: Diagnosis not present

## 2024-04-04 DIAGNOSIS — S22080D Wedge compression fracture of T11-T12 vertebra, subsequent encounter for fracture with routine healing: Secondary | ICD-10-CM | POA: Diagnosis not present

## 2024-04-04 DIAGNOSIS — I3481 Nonrheumatic mitral (valve) annulus calcification: Secondary | ICD-10-CM | POA: Diagnosis not present

## 2024-04-04 DIAGNOSIS — R918 Other nonspecific abnormal finding of lung field: Secondary | ICD-10-CM | POA: Diagnosis not present

## 2024-04-04 DIAGNOSIS — E785 Hyperlipidemia, unspecified: Secondary | ICD-10-CM | POA: Diagnosis not present

## 2024-04-04 DIAGNOSIS — J189 Pneumonia, unspecified organism: Secondary | ICD-10-CM | POA: Diagnosis present

## 2024-04-04 DIAGNOSIS — Z978 Presence of other specified devices: Secondary | ICD-10-CM | POA: Diagnosis not present

## 2024-04-04 DIAGNOSIS — R1312 Dysphagia, oropharyngeal phase: Secondary | ICD-10-CM | POA: Diagnosis not present

## 2024-04-04 DIAGNOSIS — I10 Essential (primary) hypertension: Secondary | ICD-10-CM | POA: Diagnosis not present

## 2024-04-04 DIAGNOSIS — Z7401 Bed confinement status: Secondary | ICD-10-CM | POA: Diagnosis not present

## 2024-04-04 DIAGNOSIS — M810 Age-related osteoporosis without current pathological fracture: Secondary | ICD-10-CM | POA: Diagnosis present

## 2024-04-04 DIAGNOSIS — M549 Dorsalgia, unspecified: Secondary | ICD-10-CM | POA: Diagnosis not present

## 2024-04-04 DIAGNOSIS — G9389 Other specified disorders of brain: Secondary | ICD-10-CM | POA: Diagnosis not present

## 2024-04-04 DIAGNOSIS — I69928 Other speech and language deficits following unspecified cerebrovascular disease: Secondary | ICD-10-CM | POA: Diagnosis not present

## 2024-04-04 DIAGNOSIS — J9600 Acute respiratory failure, unspecified whether with hypoxia or hypercapnia: Secondary | ICD-10-CM | POA: Diagnosis not present

## 2024-04-04 DIAGNOSIS — H409 Unspecified glaucoma: Secondary | ICD-10-CM | POA: Diagnosis present

## 2024-04-04 DIAGNOSIS — Z515 Encounter for palliative care: Secondary | ICD-10-CM | POA: Diagnosis not present

## 2024-04-04 DIAGNOSIS — T17308D Unspecified foreign body in larynx causing other injury, subsequent encounter: Secondary | ICD-10-CM | POA: Diagnosis not present

## 2024-04-04 DIAGNOSIS — R627 Adult failure to thrive: Secondary | ICD-10-CM | POA: Diagnosis present

## 2024-04-04 DIAGNOSIS — R54 Age-related physical debility: Secondary | ICD-10-CM | POA: Diagnosis present

## 2024-04-04 DIAGNOSIS — N183 Chronic kidney disease, stage 3 unspecified: Secondary | ICD-10-CM | POA: Diagnosis not present

## 2024-04-04 DIAGNOSIS — I959 Hypotension, unspecified: Secondary | ICD-10-CM | POA: Diagnosis not present

## 2024-04-04 DIAGNOSIS — Z96611 Presence of right artificial shoulder joint: Secondary | ICD-10-CM | POA: Diagnosis not present

## 2024-04-04 DIAGNOSIS — J690 Pneumonitis due to inhalation of food and vomit: Secondary | ICD-10-CM | POA: Diagnosis not present

## 2024-04-04 DIAGNOSIS — B029 Zoster without complications: Secondary | ICD-10-CM | POA: Diagnosis not present

## 2024-04-04 DIAGNOSIS — R4182 Altered mental status, unspecified: Secondary | ICD-10-CM | POA: Diagnosis not present

## 2024-04-04 DIAGNOSIS — Z952 Presence of prosthetic heart valve: Secondary | ICD-10-CM | POA: Diagnosis not present

## 2024-04-04 DIAGNOSIS — J9601 Acute respiratory failure with hypoxia: Secondary | ICD-10-CM | POA: Diagnosis not present

## 2024-04-04 DIAGNOSIS — Z794 Long term (current) use of insulin: Secondary | ICD-10-CM | POA: Diagnosis not present

## 2024-04-04 DIAGNOSIS — I2510 Atherosclerotic heart disease of native coronary artery without angina pectoris: Secondary | ICD-10-CM | POA: Diagnosis present

## 2024-04-04 DIAGNOSIS — F329 Major depressive disorder, single episode, unspecified: Secondary | ICD-10-CM | POA: Diagnosis not present

## 2024-04-04 DIAGNOSIS — E1065 Type 1 diabetes mellitus with hyperglycemia: Secondary | ICD-10-CM | POA: Diagnosis present

## 2024-04-04 DIAGNOSIS — I5032 Chronic diastolic (congestive) heart failure: Secondary | ICD-10-CM | POA: Diagnosis present

## 2024-04-04 DIAGNOSIS — R0902 Hypoxemia: Secondary | ICD-10-CM | POA: Diagnosis not present

## 2024-04-04 DIAGNOSIS — R0602 Shortness of breath: Secondary | ICD-10-CM | POA: Diagnosis not present

## 2024-04-04 DIAGNOSIS — Z1152 Encounter for screening for COVID-19: Secondary | ICD-10-CM | POA: Diagnosis not present

## 2024-04-04 DIAGNOSIS — E440 Moderate protein-calorie malnutrition: Secondary | ICD-10-CM | POA: Diagnosis present

## 2024-04-04 DIAGNOSIS — R561 Post traumatic seizures: Secondary | ICD-10-CM | POA: Diagnosis not present

## 2024-04-04 DIAGNOSIS — R1310 Dysphagia, unspecified: Secondary | ICD-10-CM | POA: Diagnosis not present

## 2024-04-04 DIAGNOSIS — Z7189 Other specified counseling: Secondary | ICD-10-CM | POA: Diagnosis not present

## 2024-04-04 DIAGNOSIS — E43 Unspecified severe protein-calorie malnutrition: Secondary | ICD-10-CM | POA: Diagnosis not present

## 2024-04-04 DIAGNOSIS — R4701 Aphasia: Secondary | ICD-10-CM | POA: Diagnosis present

## 2024-04-04 DIAGNOSIS — R4020 Unspecified coma: Secondary | ICD-10-CM | POA: Diagnosis not present

## 2024-04-04 DIAGNOSIS — J181 Lobar pneumonia, unspecified organism: Secondary | ICD-10-CM | POA: Diagnosis not present

## 2024-04-04 DIAGNOSIS — L89152 Pressure ulcer of sacral region, stage 2: Secondary | ICD-10-CM | POA: Diagnosis present

## 2024-04-04 DIAGNOSIS — L89323 Pressure ulcer of left buttock, stage 3: Secondary | ICD-10-CM | POA: Diagnosis not present

## 2024-04-04 DIAGNOSIS — L89313 Pressure ulcer of right buttock, stage 3: Secondary | ICD-10-CM | POA: Diagnosis not present

## 2024-04-04 DIAGNOSIS — I110 Hypertensive heart disease with heart failure: Secondary | ICD-10-CM | POA: Diagnosis present

## 2024-04-04 DIAGNOSIS — M1990 Unspecified osteoarthritis, unspecified site: Secondary | ICD-10-CM | POA: Diagnosis present

## 2024-04-04 DIAGNOSIS — I700 Atherosclerosis of aorta: Secondary | ICD-10-CM | POA: Diagnosis present

## 2024-04-04 DIAGNOSIS — R0689 Other abnormalities of breathing: Secondary | ICD-10-CM | POA: Diagnosis not present

## 2024-04-04 DIAGNOSIS — R079 Chest pain, unspecified: Secondary | ICD-10-CM | POA: Diagnosis not present

## 2024-04-04 DIAGNOSIS — S2241XA Multiple fractures of ribs, right side, initial encounter for closed fracture: Secondary | ICD-10-CM | POA: Diagnosis not present

## 2024-04-04 DIAGNOSIS — R41841 Cognitive communication deficit: Secondary | ICD-10-CM | POA: Diagnosis not present

## 2024-04-04 DIAGNOSIS — I69828 Other speech and language deficits following other cerebrovascular disease: Secondary | ICD-10-CM | POA: Diagnosis not present

## 2024-04-04 DIAGNOSIS — R2689 Other abnormalities of gait and mobility: Secondary | ICD-10-CM | POA: Diagnosis not present

## 2024-04-04 DIAGNOSIS — M6281 Muscle weakness (generalized): Secondary | ICD-10-CM | POA: Diagnosis not present

## 2024-04-04 DIAGNOSIS — I6529 Occlusion and stenosis of unspecified carotid artery: Secondary | ICD-10-CM | POA: Diagnosis not present

## 2024-04-04 DIAGNOSIS — K224 Dyskinesia of esophagus: Secondary | ICD-10-CM | POA: Diagnosis present

## 2024-04-04 DIAGNOSIS — E10649 Type 1 diabetes mellitus with hypoglycemia without coma: Secondary | ICD-10-CM | POA: Diagnosis not present

## 2024-04-04 LAB — CBC
HCT: 37.8 % (ref 36.0–46.0)
Hemoglobin: 12.3 g/dL (ref 12.0–15.0)
MCH: 27.2 pg (ref 26.0–34.0)
MCHC: 32.5 g/dL (ref 30.0–36.0)
MCV: 83.6 fL (ref 80.0–100.0)
Platelets: 228 10*3/uL (ref 150–400)
RBC: 4.52 MIL/uL (ref 3.87–5.11)
RDW: 14.5 % (ref 11.5–15.5)
WBC: 9.3 10*3/uL (ref 4.0–10.5)
nRBC: 0 % (ref 0.0–0.2)

## 2024-04-04 LAB — MAGNESIUM: Magnesium: 1.7 mg/dL (ref 1.7–2.4)

## 2024-04-04 LAB — COMPREHENSIVE METABOLIC PANEL WITH GFR
ALT: 11 U/L (ref 0–44)
AST: 15 U/L (ref 15–41)
Albumin: 1.9 g/dL — ABNORMAL LOW (ref 3.5–5.0)
Alkaline Phosphatase: 78 U/L (ref 38–126)
Anion gap: 9 (ref 5–15)
BUN: 16 mg/dL (ref 8–23)
CO2: 29 mmol/L (ref 22–32)
Calcium: 8.2 mg/dL — ABNORMAL LOW (ref 8.9–10.3)
Chloride: 97 mmol/L — ABNORMAL LOW (ref 98–111)
Creatinine, Ser: 0.68 mg/dL (ref 0.44–1.00)
GFR, Estimated: >60 mL/min (ref >60)
Glucose, Bld: 100 mg/dL — ABNORMAL HIGH (ref 70–99)
Potassium: 4.2 mmol/L (ref 3.5–5.1)
Sodium: 135 mmol/L (ref 135–145)
Total Bilirubin: 0.3 mg/dL (ref 0.0–1.2)
Total Protein: 5.8 g/dL — ABNORMAL LOW (ref 6.5–8.1)

## 2024-04-04 LAB — GLUCOSE, CAPILLARY
Glucose-Capillary: 107 mg/dL — ABNORMAL HIGH (ref 70–99)
Glucose-Capillary: 233 mg/dL — ABNORMAL HIGH (ref 70–99)
Glucose-Capillary: 428 mg/dL — ABNORMAL HIGH (ref 70–99)

## 2024-04-04 MED ORDER — CLINDAMYCIN PALMITATE HCL 75 MG/5ML PO SOLR: 300.0000 mg | Freq: Three times a day (TID) | ORAL | Status: AC

## 2024-04-04 MED ORDER — INSULIN ASPART 100 UNIT/ML IJ SOLN
0.0000 [IU] | Freq: Three times a day (TID) | INTRAMUSCULAR | Status: DC
  Administered 2024-04-04: 3 [IU] via SUBCUTANEOUS
  Administered 2024-04-04: 9 [IU] via SUBCUTANEOUS

## 2024-04-04 MED ORDER — INSULIN ASPART 100 UNIT/ML IJ SOLN
3.0000 [IU] | Freq: Three times a day (TID) | INTRAMUSCULAR | Status: DC
  Administered 2024-04-04 (×2): 3 [IU] via SUBCUTANEOUS

## 2024-04-04 MED ORDER — TRAMADOL HCL 50 MG PO TABS: 50.0000 mg | ORAL_TABLET | Freq: Four times a day (QID) | ORAL | 0 refills | Status: DC | PRN

## 2024-04-04 MED ORDER — INSULIN ASPART 100 UNIT/ML IJ SOLN
2.0000 [IU] | Freq: Once | INTRAMUSCULAR | Status: AC
  Administered 2024-04-04: 2 [IU] via SUBCUTANEOUS

## 2024-04-04 NOTE — TOC Transition Note (Signed)
 Transition of Care [facility]) - Discharge Note   Patient Details  Name: Danielle Clarke MRN: [id number] Date of Birth: 1941-11-05  [facility]) CM/SW Contact:  Jannice Mends, LCSW Phone Number: 04/04/2024, 1:54 PM   Clinical Narrative:    [facility] Anticipated DC date: 04/04/24 Family notified: Daughter, [name] Transport by: Lyna Sandhoff   Per MD patient ready for DC to [place]. RN to call report prior to discharge [phone]). RN, patient, patient's family, and facility notified of DC. Discharge Summary and FL2 sent to facility. DC packet on chart including signed script. Ambulance transport requested for patient.   CSW will sign off for now as social work intervention is no longer needed. Please consult us  again if new needs arise.     Final next level of care: Skilled Nursing Facility Barriers to Discharge: Barriers Resolved   Patient Goals and CMS Choice Patient states their goals for this hospitalization and ongoing recovery are:: Rehab CMS Medicare.gov Compare Post Acute Care list provided to:: Patient Represenative (must comment)   [place] ownership interest in [facility].provided to:: Adult Children    Discharge Placement   Existing PASRR number confirmed : 04/04/24          [facility] Patient to be transferred to facility by: [facility] Name of family member notified: Daughter Patient and family notified of of transfer: 04/04/24  Discharge Plan and Services Additional resources added to the After Visit Summary for   In-house Referral: Clinical Social Work   Post Acute Care Choice: Skilled Nursing Facility                               Social Drivers of Health (SDOH) Interventions SDOH Screenings   Food Insecurity: No Food Insecurity (04/02/2024)  Housing: Low Risk  (04/02/2024)  Transportation Needs: No Transportation Needs (04/02/2024)  Utilities: Not At Risk (04/02/2024)  Depression  (PHQ2-9): Low Risk  (05/28/2019)  Social Connections: Patient Declined (04/02/2024)  Tobacco Use: Low Risk  (04/03/2024)     Readmission Risk Interventions    04/02/2024   11:26 AM 02/07/2023   12:07 PM  Readmission Risk Prevention Plan  Post Dischage Appt  Complete  Medication Screening  Complete  Transportation Screening Complete Complete  PCP or Specialist Appt within 5-7 Days Complete   Home Care Screening Complete   Medication Review (RN CM) Referral to Pharmacy

## 2024-04-04 NOTE — Evaluation (Signed)
 Occupational Therapy Evaluation Patient Details Name: Danielle Clarke MRN: [id number] DOB: 09/27/1941 Today's Date: 04/04/2024   History of Present Illness   Pt is 83 yo female who presents on 04/01/24 with AMS/ unresponsiveness from home. Recently had shingles. Noted respiratory failure possibly due to aspiration. T12 compression fx. PMH: dysphagia, CAD, TAVR, bradycardia with PPM placement, HTN, HLD, DM1, ICH     Clinical Impressions Pt presents with decline in function and safety with ADLs and ADL mobility with impaired strength, balance and endurance; pt limited by back pain, has TLSO. PTA pt reports that she lived alone (daughter Kilmartin next door) and has a caregiver to sit with her for Sup and for transportation; pt was Ind with ADLs/selfcare, light meal prep, daughter assists with showers, used a cane but has been1-2 weeks since she could walk because of pain. Pt currently requires max A to sit EOB, max A with UB ADLs, total A with LB ADLs, total A with toileting and unable to attempt standing due to back pain, tolerates sitting EOB less than 5 minutes. Pt would benefit from acute OT services to address impairments to maximize level of function and safety     If plan is discharge home, recommend the following:   A lot of help with bathing/dressing/bathroom;Two people to help with walking and/or transfers;Assistance with cooking/housework;Help with stairs or ramp for entrance;Assist for transportation     Functional Status Assessment   Patient has had a recent decline in their functional status and demonstrates the ability to make significant improvements in function in a reasonable and predictable amount of time.     Equipment Recommendations   Other (comment) (defer)     Recommendations for Other Services         Precautions/Restrictions   Precautions Precautions: Fall;Other (comment) Precaution/Restrictions Comments: back, has TLSO brace Required Braces or  Orthoses: Spinal Brace Spinal Brace: Thoracolumbosacral orthotic Restrictions Weight Bearing Restrictions Per Provider Order: No     Mobility Bed Mobility Overal bed mobility: Needs Assistance Bed Mobility: Supine to Sit, Sit to Supine     Supine to sit: Max assist Sit to supine: Max assist   General bed mobility comments: max A with LE mgt and to elevate trunk. tolerated sitting EOB less than 5 minutes due to back pain    Transfers                   General transfer comment: pt unable to tolerate attempt to stand      Balance Overall balance assessment: Needs assistance Sitting-balance support: Feet unsupported, Bilateral upper extremity supported Sitting balance-Leahy Scale: Poor Sitting balance - Comments: needed min A to maintain sitting and tolerated less than 5 mins before needing to return to supine Postural control: Right lateral lean                                 ADL either performed or assessed with clinical judgement   ADL Overall ADL's : Needs assistance/impaired Eating/Feeding: Set up;Sitting;Bed level   Grooming: Wash/dry hands;Wash/dry face;Minimal assistance   Upper Body Bathing: Maximal assistance   Lower Body Bathing: Total assistance   Upper Body Dressing : Maximal assistance   Lower Body Dressing: Total assistance     Toilet Transfer Details (indicate cue type and reason): unable Toileting- Clothing Manipulation and Hygiene: Total assistance;Bed level         General ADL Comments: pt limited by back pain  Vision Baseline Vision/History: 1 Wears glasses Ability to See in Adequate Light: 0 Adequate Patient Visual Report: No change from baseline       Perception         Praxis         Pertinent Vitals/Pain Pain Assessment Pain Assessment: Faces Faces Pain Scale: Hurts whole lot Pain Location: generalized with mobility Pain Descriptors / Indicators: Aching, Discomfort, Grimacing, Guarding,  Moaning Pain Intervention(s): Limited activity within patient's tolerance, Monitored during session, Repositioned, Patient requesting pain meds-RN notified     Extremity/Trunk Assessment Upper Extremity Assessment Upper Extremity Assessment: Generalized weakness;Right hand dominant       Cervical / Trunk Assessment Cervical / Trunk Assessment: Other exceptions Cervical / Trunk Exceptions: scoliosis   Communication Communication Communication: No apparent difficulties   Cognition Arousal: Alert Behavior During Therapy: WFL for tasks assessed/performed Cognition: No apparent impairments                               Following commands: Intact       Cueing  General Comments   Cueing Techniques: Verbal cues      Exercises     Shoulder Instructions      Home Living Family/patient expects to be discharged to:: Private residence Living Arrangements: Alone Available Help at Discharge: Family;Personal care attendant Type of Home: House Home Access: Stairs to enter Secretary/administrator of Steps: 2 Entrance Stairs-Rails: Left       Bathroom Shower/Tub: Chief Strategy Officer: Handicapped height     Home Equipment: Shower seat;Grab bars - tub/shower;Rolling Environmental consultant (2 wheels);Cane - single point;Wheelchair - manual;BSC/3in1   Additional Comments: daughter lives next door, is a Financial controller so at home intermittently. Pt takes care of her cat      Prior Functioning/Environment Prior Level of Function : Needs assist             Mobility Comments: 1-2 weeks since she could walk because of pain. Prior likes to go shopping with her aide, used a cane ADLs Comments: Ind with ADLs/selfcare, light meal prep, daughter assists with showers, used a cane but has been1-2 weeks since she could walk because of pain. Prior likes to go shopping with her aide    OT Problem List: Decreased strength;Impaired balance (sitting and/or  standing);Pain;Decreased activity tolerance;Decreased knowledge of use of DME or AE   OT Treatment/Interventions: Self-care/ADL training;Therapeutic exercise;Patient/family education;Therapeutic activities;Balance training;DME and/or AE instruction      OT Goals(Current goals can be found in the care plan section)   Acute Rehab OT Goals Patient Stated Goal: get better OT Goal Formulation: With patient/family Time For Goal Achievement: 04/18/24 Potential to Achieve Goals: Good ADL Goals Pt Will Perform Grooming: with contact guard assist;with supervision;sitting Pt Will Perform Upper Body Bathing: with mod assist;with min assist;sitting Pt Will Perform Upper Body Dressing: with mod assist;with min assist;sitting Pt Will Transfer to Toilet: with max assist;with mod assist;stand pivot transfer;bedside commode Additional ADL Goal #1: will complete bed mobility using log roll technique to sit EOB in prep for ADL tasks   OT Frequency:  Min 2X/week    Co-evaluation              AM-PAC OT "6 Clicks" Daily Activity     Outcome Measure Help from another person eating meals?: A Little Help from another person taking care of personal grooming?: A Little Help from another person toileting, which includes using toliet, bedpan,  or urinal?: Total Help from another person bathing (including washing, rinsing, drying)?: A Lot Help from another person to put on and taking off regular upper body clothing?: A Lot Help from another person to put on and taking off regular lower body clothing?: Total 6 Click Score: 12   End of Session Nurse Communication: Mobility status  Activity Tolerance: Patient limited by pain Patient left: in bed;with call bell/phone within reach;with bed alarm set;with family/visitor present;with nursing/sitter in room  OT Visit Diagnosis: Other abnormalities of gait and mobility (R26.89);History of falling (Z91.81);Pain;Muscle weakness (generalized) (M62.81) Pain - part  of body:  (back)                Time: 1610-9604 OT Time Calculation (min): 28 min Charges:  OT General Charges $OT Visit: 1 Visit OT Evaluation $OT Eval Moderate Complexity: 1 Mod OT Treatments $Therapeutic Activity: 8-22 mins    Alfred Ann 04/04/2024, 12:00 PM

## 2024-04-04 NOTE — Inpatient Diabetes Management (Signed)
 Inpatient Diabetes Program Recommendations  AACE/ADA: New Consensus Statement on Inpatient Glycemic Control (2015)  Target Ranges:  Prepandial:   less than 140 mg/dL      Peak postprandial:   less than 180 mg/dL (1-2 hours)      Critically ill patients:  140 - 180 mg/dL   Lab Results  Component Value Date   GLUCAP 107 (H) 04/04/2024   HGBA1C 9.7 (H) 04/01/2024    Review of Glycemic Control  Latest Reference Range & Units 04/02/24 09:05 04/02/24 12:39 04/02/24 16:48 04/02/24 19:58 04/03/24 00:26 04/03/24 06:18 04/03/24 06:59 04/03/24 07:30 04/03/24 11:43  Glucose-Capillary 70 - 99 mg/dL 161 (H) 096 (H) 045 (H) 202 (H) 177 (H) 66 (L) 101 (H) 174 (H) 289 (H)   Diabetes history: DM 2 Outpatient Diabetes medications: Humalog 0-10 units tid, Lantus  10-12 units Daily Current orders for Inpatient glycemic control:  Semglee  10 units Daily Novolog  0-9 units Q4 hours Diet ordered  Ensure Enlive bid between meals (40 grams of carbohydrates)  Inpatient Diabetes Program Recommendations:    -   Change Frequency of Novolog  Correction to 0-9 units tid + hs from Q4 hours -   May consider adding Novolog  2 units tid meal coverage if eating >50% of meals  Thanks,  Eloise Hake RN, MSN, BC-ADM Inpatient Diabetes Coordinator Team Pager [phone] (8a-5p)

## 2024-04-04 NOTE — Discharge Summary (Signed)
 Physician Discharge Summary  VYSHNAVI CIFELLI ZOX:[id number] DOB: 1940/11/29 DOA: 04/01/2024  PCP: Elester Grim, MD  Admit date: 04/01/2024 Discharge date: 04/04/2024  Admitted From: home Disposition:  SNF  Recommendations for Outpatient Follow-up:  Follow up with PCP in 1-2 weeks Continue to work with physical therapy, occupational as well as speech therapy at SNF  Home Health: none Equipment/Devices: none  Discharge Condition: stable CODE STATUS: Full code Diet Orders (From admission, onward)     Start     Ordered   04/03/24 1138  DIET DYS 2 Room service appropriate? Yes with Assist; Fluid consistency: Thin  Diet effective now       Question Answer Comment  Room service appropriate? Yes with Assist   Fluid consistency: Thin      04/03/24 1137            Brief Narrative / Interim history: 83 year old female with history of HTN, HLD, AAS status post TAVR 2024, DM 1 who comes into the hospital with an acute choking episode and unresponsiveness.  Apparently she has been having chronic trouble swallowing with increased episodes of choking over the last several weeks.  She apparently was eating, she was noted to be coughing, daughter left her little bit but then when she returned patient was unresponsive.  EMS was called and she was found to be hypoxic into the 60s, requiring nonrebreather.  She was brought to the ER.  Of note, she has been having shingles and varicella of the right hemithorax for the past couple of weeks, placed on antivirals, muscle relaxants and pain medications.  Hospital Course / Discharge diagnoses: Principal Problem:   Loss of consciousness (HCC) Active Problems:   Diabetes mellitus type 1 (HCC)   Essential hypertension, benign   Choking   Acute hypoxic respiratory failure (HCC)   Compression fracture of T12 vertebra (HCC)   Shingles   Protein-calorie malnutrition, severe   Principal problem Syncope -  possibly after episode of choking and  altered breathing.  She was found to be profoundly hypoxic initially required nonrebreather, but rapidly weaned off to 2 L upon her waking up.  She did have some ST segment changes, cardiology consulted and evaluated patient, did not felt this was significant and appeared chronic.   Active problems Dysphagia, difficulty swallowing -underwent a barium swallow 5/11 which showed mild esophageal dysmotility, small epinephric diverticulum and possible narrowing of the upper esophagus but difficult to fully view due to hardware.  Gastroenterology consulted, evaluated patient, and do not believe that an endoscopy will be beneficial or able to fix her current issues.  Recommend to continue speech therapy at rehab Shingles -no active vesicles at this time.  Continue antivirals as prescribed as an outpatient T12 compression fracture-neurosurgery consulted.  For now TLSO brace while out of bed.   DM1-continue glargine, sliding scale Essential hypertension-blood pressure stable, monitor, she has not been taking her home losartan  Acute hypoxic respiratory failure due to aspiration pneumonia-initially required nonrebreather, now down to 2 L.  Likely due to aspiration pneumonia.  Has been placed on antibiotics, continue for 2 additional days upon discharge to complete a 5-day course.  Wean off to room air as tolerated  Sepsis ruled out   Discharge Instructions   Allergies as of 04/04/2024       Reactions   Cefaclor Anaphylaxis   Tolerated cephalexin in 2018   Tetracycline Anaphylaxis   Vancomycin Anaphylaxis   Alendronate Sodium Other (See Comments)   Augmentin [amoxicillin-pot Clavulanate] Other (See  Comments)   Reaction unknown >> Anaphylaxis Has patient had a PCN reaction causing immediate rash, facial/tongue/throat swelling, SOB or lightheadedness with hypotension: Unknown Has patient had a PCN reaction causing severe rash involving mucus membranes or skin necrosis: Unknown Has patient had a PCN  reaction that required hospitalization: Unknown Has patient had a PCN reaction occurring within the last 10 years: Unknown If all of the above answers are "NO", then may proceed with Cephalosporin use.   Nsaids    CKD   Quinapril  Hcl    elevated potassium   Sulfamethoxazole -trimethoprim     decreased kidney function   Prednisone Other (See Comments)   Reaction unknown        Medication List     STOP taking these medications    azithromycin  500 MG tablet Commonly known as: Zithromax    losartan  25 MG tablet Commonly known as: COZAAR        TAKE these medications    aspirin  EC 81 MG tablet Take 1 tablet (81 mg total) by mouth daily. Swallow whole.   clindamycin  75 MG/5ML solution Commonly known as: CLEOCIN  Take 20 mLs (300 mg total) by mouth 3 (three) times daily for 2 days.   Dexcom G7 Receiver Seymour Dapper See admin instructions.   Dexcom G7 Sensor Misc   HumaLOG KwikPen 100 UNIT/ML KwikPen Generic drug: insulin  lispro Inject 0-10 Units into the skin 3 (three) times daily as needed (high blood sugar). Sliding Scale   insulin  glargine 100 UNIT/ML Solostar Pen Commonly known as: LANTUS  Inject 13 Units into the skin daily. What changed:  how much to take when to take this   megestrol  20 MG tablet Commonly known as: MEGACE  Take 20 mg by mouth 2 (two) times daily.   methocarbamol 750 MG tablet Commonly known as: ROBAXIN Take 750 mg by mouth 4 (four) times daily.   metoprolol  tartrate 25 MG tablet Commonly known as: LOPRESSOR  Take 1 tablet (25 mg total) by mouth as directed for 1 dose.   protein supplement shake Liqd Commonly known as: PREMIER PROTEIN Take 11 oz by mouth 2 (two) times daily. 30 mg of protein   Salonpas Pain Relief Patch Ptch Apply 1 patch topically daily as needed (patch).   simvastatin  40 MG tablet Commonly known as: ZOCOR  Take 40 mg by mouth at bedtime.   traMADol  50 MG tablet Commonly known as: ULTRAM  Take 1 tablet (50 mg total) by  mouth every 6 (six) hours as needed.   Valtrex 1000 MG tablet Generic drug: valACYclovir Take 1,000 mg by mouth 2 (two) times daily.        Contact information for after-discharge care     Destination     [facility] Preferred SNF .   Service: Skilled Nursing Contact information: [address] [place]  [phone]                    Consultations: GI Neurosurgery   Procedures/Studies:  DG ESOPHAGUS W SINGLE CM (SOL OR THIN BA) Result Date: 04/02/2024 CLINICAL DATA:  83 year old female who presented to the ED on 04/01/2024 after being found unresponsive due to choking on one of her pills. Patient with a history of chronic difficulty swallowing and increasing number of episodes of choking over the past several weeks. Request for esophagram. EXAM: ESOPHAGUS/BARIUM SWALLOW/TABLET STUDY TECHNIQUE: Single contrast examination was performed using thin liquid barium. This exam was performed by Estella Helling, PA-C, and was supervised and interpreted by Dr. Wilnette Haste. FLUOROSCOPY: Radiation  Exposure Index (as provided by the fluoroscopic device): 5.2 mGy Kerma COMPARISON:  DG SWALLOW FUNC MEDICARE - 04/07/24. FINDINGS: Swallowing: Appears normal. No vestibular penetration or aspiration seen. Pharynx: Unremarkable. Esophagus: No visible mucosal abnormalities. Narrowing of the upper esophagus roughly at the level of C7-T1, although difficult to fully view due to hardware. Possible esophageal diverticulum in the area of the gastroesophageal junction. Esophageal motility: Mild dysmotility with intermittent tertiary contractions. Hiatal Hernia: None. Gastroesophageal reflux: None visualized. Ingested 13mm barium tablet: Patient refused to swallow tablet whole. Other: None. IMPRESSION: Mild esophageal dysmotility Small epiphrenic diverticulum. Electronically Signed   By: Myrlene Asper D.O.   On: 04/02/2024 15:59   DG Thoracic Spine 2  View Result Date: 04/01/2024 CLINICAL DATA:  Back pain EXAM: THORACIC SPINE 2 VIEWS; LUMBAR SPINE - 2-3 VIEW COMPARISON:  CT chest abdomen pelvis 01/18/2023 FINDINGS: Right convex thoracic and left convex lumbar curve. Demineralization and overlapping soft tissue limits assessment for subtle fractures. Compression fracture of T12 with 50% vertebral body height loss is new since 01/18/2023. Advanced lumbar spondylosis and facet arthropathy. IMPRESSION: Compression fracture of T12 is age indeterminate but new since 01/18/2023. Electronically Signed   By: Rozell Cornet M.D.   On: 04/01/2024 20:29   DG Lumbar Spine 2-3 Views Result Date: 04/01/2024 CLINICAL DATA:  Back pain EXAM: THORACIC SPINE 2 VIEWS; LUMBAR SPINE - 2-3 VIEW COMPARISON:  CT chest abdomen pelvis 01/18/2023 FINDINGS: Right convex thoracic and left convex lumbar curve. Demineralization and overlapping soft tissue limits assessment for subtle fractures. Compression fracture of T12 with 50% vertebral body height loss is new since 01/18/2023. Advanced lumbar spondylosis and facet arthropathy. IMPRESSION: Compression fracture of T12 is age indeterminate but new since 01/18/2023. Electronically Signed   By: Rozell Cornet M.D.   On: 04/01/2024 20:29   DG Chest Portable 1 View Result Date: 04/01/2024 CLINICAL DATA:  hypoxia to 60's, possible aspiration, AMS. EXAM: PORTABLE CHEST 1 VIEW COMPARISON:  12/04/2023. FINDINGS: There are nonspecific opacities in the right para cardiac region, which may represent pneumonitis versus atelectasis and can be due to suspected aspiration. Bilateral lung fields are otherwise clear. Bilateral costophrenic angles are clear. Stable cardio-mediastinal silhouette. Mitral annulus calcifications noted. 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 No acute osseous abnormalities. Subacute/healing right posterior rib fractures noted. Right shoulder arthroplasty noted. There is cervicothoracic spinal  fixation hardware. The soft tissues are within normal limits. IMPRESSION: *Nonspecific opacities in the right para cardiac region, which may represent pneumonitis versus atelectasis and can be due to suspected aspiration. Electronically Signed   By: Beula Brunswick M.D.   On: 04/01/2024 16:23   CT HEAD WO CONTRAST ( ) Result Date: 04/01/2024 CLINICAL DATA:  Altered mental status. EXAM: CT HEAD WITHOUT CONTRAST TECHNIQUE: Contiguous axial images were obtained from the base of the skull through the vertex without intravenous contrast. RADIATION DOSE REDUCTION: This exam was performed according to the departmental dose-optimization program which includes automated exposure control, adjustment of the mA and/or kV according to patient size and/or use of iterative reconstruction technique. COMPARISON:  February 10, 2024 FINDINGS: Brain: There is generalized cerebral atrophy with widening of the extra-axial spaces and ventricular dilatation. There are areas of decreased attenuation within the white matter tracts of the supratentorial brain, consistent with microvascular disease changes. Stable areas of bilateral anterior temporal lobe and left frontal lobe encephalomalacia are noted. Vascular: Marked severity bilateral cavernous carotid artery calcification is seen. Skull: Normal. Negative for fracture or focal lesion. Sinuses/Orbits: There  is marked severity left maxillary sinus, left-sided sphenoid sinus and right-sided frontal sinus mucosal thickening. Mild bilateral maxillary sinus mucosal thickening is also noted. Other: None. IMPRESSION: 1. Generalized cerebral atrophy with widening of the extra-axial spaces and ventricular dilatation. 2. Stable areas of bilateral anterior temporal lobe and left frontal lobe encephalomalacia. 3. Marked severity left maxillary sinus, left-sided sphenoid sinus and right-sided frontal sinus mucosal thickening. Electronically Signed   By: Virgle Grime M.D.   On: 04/01/2024 16:04     Subjective: - no chest pain, shortness of breath, no abdominal pain, nausea or vomiting.   Discharge Exam: BP (!) 147/73 (BP Location: Right Arm)   Pulse 87   Temp 98.2 F (36.8 C)   Resp 18   Ht 5' (1.524 m)   Wt 39.5 kg   SpO2 100%   BMI 17.01 kg/m   General: Pt is alert, awake, not in acute distress Cardiovascular: RRR, S1/S2 +, no rubs, no gallops Respiratory: CTA bilaterally, no wheezing, no rhonchi Abdominal: Soft, NT, ND, bowel sounds + Extremities: no edema, no cyanosis    The results of significant diagnostics from this hospitalization (including imaging, microbiology, ancillary and laboratory) are listed below for reference.     Microbiology: Recent Results (from the past 240 hours)  Resp panel by RT-PCR (RSV, Flu A&B, Covid) Anterior Nasal Swab     Status: None   Collection Time: 04/01/24  4:33 PM   Specimen: Anterior Nasal Swab  Result Value Ref Range Status   SARS Coronavirus 2 by RT PCR NEGATIVE NEGATIVE Final   Influenza A by PCR NEGATIVE NEGATIVE Final   Influenza B by PCR NEGATIVE NEGATIVE Final    Comment: (NOTE) The Xpert Xpress SARS-CoV-2/FLU/RSV plus assay is intended as an aid in the diagnosis of influenza from Nasopharyngeal swab specimens and should not be used as a sole basis for treatment. Nasal washings and aspirates are unacceptable for Xpert Xpress SARS-CoV-2/FLU/RSV testing.  Fact Sheet for Patients: BloggerCourse.com  Fact Sheet for Healthcare Providers: SeriousBroker.it  This test is not yet approved or cleared by the [place]  FDA and has been authorized for detection and/or diagnosis of SARS-CoV-2 by FDA under an Emergency Use Authorization (EUA). This EUA will remain in effect (meaning this test can be used) for the duration of the COVID-19 declaration under Section 564(b)(1) of the Act, 21 U.S.C. section 360bbb-3(b)(1), unless the authorization is terminated  or revoked.     Resp Syncytial Virus by PCR NEGATIVE NEGATIVE Final    Comment: (NOTE) Fact Sheet for Patients: BloggerCourse.com  Fact Sheet for Healthcare Providers: SeriousBroker.it  This test is not yet approved or cleared by the [place]  FDA and has been authorized for detection and/or diagnosis of SARS-CoV-2 by FDA under an Emergency Use Authorization (EUA). This EUA will remain in effect (meaning this test can be used) for the duration of the COVID-19 declaration under Section 564(b)(1) of the Act, 21 U.S.C. section 360bbb-3(b)(1), unless the authorization is terminated or revoked.  Performed at [facility], [address] [place], [place] [address]   Culture, blood (Routine X 2) w Reflex to ID Panel     Status: None (Preliminary result)   Collection Time: 04/01/24 11:00 PM   Specimen: BLOOD  Result Value Ref Range Status   Specimen Description BLOOD SITE NOT SPECIFIED  Final   Special Requests   Final    BOTTLES DRAWN AEROBIC AND ANAEROBIC Blood Culture results may not be optimal due to an inadequate volume of  blood received in culture bottles   Culture   Final    NO GROWTH 3 DAYS Performed at [facility], [address] [place], [place] [address]    Report Status PENDING  Incomplete  Culture, blood (Routine X 2) w Reflex to ID Panel     Status: None (Preliminary result)   Collection Time: 04/01/24 11:15 PM   Specimen: BLOOD  Result Value Ref Range Status   Specimen Description BLOOD SITE NOT SPECIFIED  Final   Special Requests   Final    BOTTLES DRAWN AEROBIC AND ANAEROBIC Blood Culture results may not be optimal due to an inadequate volume of blood received in culture bottles   Culture   Final    NO GROWTH 3 DAYS Performed at [facility], [address], [place], [place] [address]    Report Status PENDING  Incomplete     Labs: Basic Metabolic Panel: Recent Labs  Lab  04/01/24 1451 04/01/24 1521 04/01/24 1618 04/02/24 0512 04/04/24 0709  NA 130*  --   --  133* 135  K 4.0  --   --  3.9 4.2  CL 91*  --   --  98 97*  CO2 26  --   --  26 29  GLUCOSE 270*  --   --  258* 100*  BUN 32*  --   --  24* 16  CREATININE 0.78  --   --  0.73 0.68  CALCIUM 8.8*  --   --  8.2* 8.2*  MG  --  1.9  --   --  1.7  PHOS  --   --  3.5  --   --    Liver Function Tests: Recent Labs  Lab 04/01/24 1451 04/04/24 0709  AST 19 15  ALT 13 11  ALKPHOS 101 78  BILITOT 0.5 0.3  PROT 7.0 5.8*  ALBUMIN 2.4* 1.9*   CBC: Recent Labs  Lab 04/01/24 1451 04/02/24 0512 04/04/24 0709  WBC 14.5* 9.5 9.3  HGB 13.4 10.8* 12.3  HCT 40.7 32.5* 37.8  MCV 83.4 83.3 83.6  PLT 258 194 228   CBG: Recent Labs  Lab 04/03/24 1143 04/03/24 1821 04/03/24 2152 04/04/24 0243 04/04/24 1120  GLUCAP 289* 325* 257* 107* 233*   Hgb A1c Recent Labs    04/01/24 1452  HGBA1C 9.7*   Lipid Profile No results for input(s): "CHOL", "HDL", "LDLCALC", "TRIG", "CHOLHDL", "LDLDIRECT" in the last 72 hours. Thyroid  function studies Recent Labs    04/01/24 1521  TSH 3.035   Urinalysis    Component Value Date/Time   COLORURINE YELLOW 02/02/2023 0950   APPEARANCEUR CLEAR 02/02/2023 0950   LABSPEC 1.021 02/02/2023 0950   PHURINE 5.0 02/02/2023 0950   GLUCOSEU >=500 (A) 02/02/2023 0950   HGBUR NEGATIVE 02/02/2023 0950   BILIRUBINUR NEGATIVE 02/02/2023 0950   KETONESUR 5 (A) 02/02/2023 0950   PROTEINUR NEGATIVE 02/02/2023 0950   UROBILINOGEN 0.2 10/07/2014 1808   NITRITE NEGATIVE 02/02/2023 0950   LEUKOCYTESUR NEGATIVE 02/02/2023 0950    FURTHER DISCHARGE INSTRUCTIONS:   Get Medicines reviewed and adjusted: Please take all your medications with you for your next visit with your Primary MD   Laboratory/radiological data: Please request your Primary MD to go over all hospital tests and procedure/radiological results at the follow up, please ask your Primary MD to get all  Hospital records sent to his/her office.   In some cases, they will be blood work, cultures and biopsy results pending at  the time of your discharge. Please request that your primary care M.D. goes through all the records of your hospital data and follows up on these results.   Also Note the following: If you experience worsening of your admission symptoms, develop shortness of breath, life threatening emergency, suicidal or homicidal thoughts you must seek medical attention immediately by calling 911 or calling your MD immediately  if symptoms less severe.   You must read complete instructions/literature along with all the possible adverse reactions/side effects for all the Medicines you take and that have been prescribed to you. Take any new Medicines after you have completely understood and accpet all the possible adverse reactions/side effects.    Do not drive when taking Pain medications or sleeping medications (Benzodaizepines)   Do not take more than prescribed Pain, Sleep and Anxiety Medications. It is not advisable to combine anxiety,sleep and pain medications without talking with your primary care practitioner   Special Instructions: If you have smoked or chewed Tobacco  in the last 2 yrs please stop smoking, stop any regular Alcohol  and or any Recreational drug use.   Wear Seat belts while driving.   Please note: You were cared for by a hospitalist during your hospital stay. Once you are discharged, your primary care physician will handle any further medical issues. Please note that NO REFILLS for any discharge medications will be authorized once you are discharged, as it is imperative that you return to your primary care physician (or establish a relationship with a primary care physician if you do not have one) for your post hospital discharge needs so that they can reassess your need for medications and monitor your lab values.  Time coordinating discharge: 35  minutes  SIGNED:  Kathlen Para, MD, PhD 04/04/2024, 12:56 PM

## 2024-04-04 NOTE — TOC Progression Note (Addendum)
 Transition of Care [facility]) - Progression Note    Patient Details  Name: Danielle Clarke MRN: [id number] Date of Birth: [birth year]  [facility] CM/SW Contact  Jannice Mends, LCSW Phone Number: 04/04/2024, 10:50 AM  Clinical Narrative:    10:50am-Per MD, patient ready to discharge. CSW can start insurance process once OT evaluation is in; request sent to OT.   Bed offers provided to patient's daughter. She would like to proceed with [facility]. Patient will have co-pay from days 1-20 at #10 per per day, days 21-100 is $214/day. [facility] require $70 up front.   12:10 PM-Insurance approval submitted and approved for [facility], Ref# [id number], effective 04/04/2024-04/08/2024. Confirming with [facility].  12:56 PM-[facility] ready for patient. CSW updated daughter who will go there to complete paperwork and pay. CSW will schedule PTAR this afternoon.  Expected Discharge Plan: Skilled Nursing Facility Barriers to Discharge: Continued Medical Work up, English as a second language teacher, SNF Pending bed offer  Expected Discharge Plan and Services In-house Referral: Clinical Social Work   Post Acute Care Choice: Skilled Nursing Facility Living arrangements for the past 2 months: Single Family Home                                       Social Determinants of Health (SDOH) Interventions SDOH Screenings   Food Insecurity: No Food Insecurity (04/02/2024)  Housing: Low Risk  (04/02/2024)  Transportation Needs: No Transportation Needs (04/02/2024)  Utilities: Not At Risk (04/02/2024)  Depression (PHQ2-9): Low Risk  (05/28/2019)  Social Connections: Patient Declined (04/02/2024)  Tobacco Use: Low Risk  (04/03/2024)    Readmission Risk Interventions    04/02/2024   11:26 AM 02/07/2023   12:07 PM  Readmission Risk Prevention Plan  Post Dischage Appt  Complete  Medication Screening  Complete  Transportation Screening Complete Complete  PCP or Specialist Appt within 5-7 Days Complete   Home Care Screening  Complete   Medication Review (RN CM) Referral to Pharmacy

## 2024-04-06 LAB — CULTURE, BLOOD (ROUTINE X 2)
Culture: NO GROWTH
Culture: NO GROWTH

## 2024-04-07 ENCOUNTER — Telehealth: Payer: Self-pay

## 2024-04-07 DIAGNOSIS — J9601 Acute respiratory failure with hypoxia: Secondary | ICD-10-CM | POA: Diagnosis not present

## 2024-04-07 DIAGNOSIS — N183 Chronic kidney disease, stage 3 unspecified: Secondary | ICD-10-CM | POA: Diagnosis not present

## 2024-04-07 DIAGNOSIS — T17308D Unspecified foreign body in larynx causing other injury, subsequent encounter: Secondary | ICD-10-CM | POA: Diagnosis not present

## 2024-04-07 DIAGNOSIS — S22080D Wedge compression fracture of T11-T12 vertebra, subsequent encounter for fracture with routine healing: Secondary | ICD-10-CM | POA: Diagnosis not present

## 2024-04-07 DIAGNOSIS — E1022 Type 1 diabetes mellitus with diabetic chronic kidney disease: Secondary | ICD-10-CM | POA: Diagnosis not present

## 2024-04-07 DIAGNOSIS — Z952 Presence of prosthetic heart valve: Secondary | ICD-10-CM | POA: Diagnosis not present

## 2024-04-07 DIAGNOSIS — E43 Unspecified severe protein-calorie malnutrition: Secondary | ICD-10-CM | POA: Diagnosis not present

## 2024-04-07 DIAGNOSIS — B029 Zoster without complications: Secondary | ICD-10-CM | POA: Diagnosis not present

## 2024-04-07 DIAGNOSIS — R1310 Dysphagia, unspecified: Secondary | ICD-10-CM | POA: Diagnosis not present

## 2024-04-07 NOTE — Telephone Encounter (Signed)
No messages received for at least 21 days Last message received 22 days ago. The patient will be deactivated in 68 days. 

## 2024-04-07 NOTE — Telephone Encounter (Signed)
 Called patient on phone at [facility], patient answered phone yelling "I need help." Patient states she is laying in bed and can't get up. Patient reports she is having back pain (not new for her). Called facility spoke to nursing direction who sent CMA into room. This RN spoke to CMA who advised patient wanted to be pulled up into bed and wanted something to drink. Patient was doing better after repositioned.   Patient does not have remote monitor beside bed.   Spoke to patients son who states he will find the box at patients house and take to [facility] to plug in beside patients bed. Advised to call back once completed so we can follow up to make sure she is connected. Voiced understanding.

## 2024-04-08 DIAGNOSIS — L89323 Pressure ulcer of left buttock, stage 3: Secondary | ICD-10-CM | POA: Diagnosis not present

## 2024-04-08 DIAGNOSIS — L89313 Pressure ulcer of right buttock, stage 3: Secondary | ICD-10-CM | POA: Diagnosis not present

## 2024-04-08 NOTE — Care Management Important Message (Signed)
 Important Message  Patient Details  Name: Danielle Clarke MRN: [id number] Date of Birth: 21-Jun-1941   Important Message Given:  Yes - Medicare IM   Patient left prior to IM delivery will mail a copy to the patient home addess.   Charlett Merkle 04/08/2024, 12:43 PM

## 2024-04-09 ENCOUNTER — Ambulatory Visit (HOSPITAL_COMMUNITY)
Admission: RE | Admit: 2024-04-09 | Discharge: 2024-04-09 | Disposition: A | Discharge status: Home / Self Care | Source: Ambulatory Visit | Attending: Physician Assistant | Admitting: Physician Assistant

## 2024-04-09 DIAGNOSIS — I10 Essential (primary) hypertension: Secondary | ICD-10-CM | POA: Diagnosis not present

## 2024-04-09 DIAGNOSIS — Z7189 Other specified counseling: Secondary | ICD-10-CM | POA: Diagnosis not present

## 2024-04-09 DIAGNOSIS — J9601 Acute respiratory failure with hypoxia: Secondary | ICD-10-CM | POA: Diagnosis not present

## 2024-04-09 DIAGNOSIS — F329 Major depressive disorder, single episode, unspecified: Secondary | ICD-10-CM | POA: Diagnosis not present

## 2024-04-09 DIAGNOSIS — R1310 Dysphagia, unspecified: Secondary | ICD-10-CM | POA: Diagnosis not present

## 2024-04-09 DIAGNOSIS — N183 Chronic kidney disease, stage 3 unspecified: Secondary | ICD-10-CM | POA: Diagnosis not present

## 2024-04-09 DIAGNOSIS — M4802 Spinal stenosis, cervical region: Secondary | ICD-10-CM | POA: Diagnosis not present

## 2024-04-09 DIAGNOSIS — J690 Pneumonitis due to inhalation of food and vomit: Secondary | ICD-10-CM | POA: Diagnosis not present

## 2024-04-09 DIAGNOSIS — E1022 Type 1 diabetes mellitus with diabetic chronic kidney disease: Secondary | ICD-10-CM | POA: Diagnosis not present

## 2024-04-11 ENCOUNTER — Encounter (HOSPITAL_COMMUNITY): Payer: Self-pay | Admitting: *Deleted

## 2024-04-11 NOTE — Telephone Encounter (Signed)
 Letter sent for outreach to schedule patient for appointment.

## 2024-04-14 DIAGNOSIS — N189 Chronic kidney disease, unspecified: Secondary | ICD-10-CM | POA: Diagnosis not present

## 2024-04-14 DIAGNOSIS — Z794 Long term (current) use of insulin: Secondary | ICD-10-CM | POA: Diagnosis not present

## 2024-04-14 DIAGNOSIS — I10 Essential (primary) hypertension: Secondary | ICD-10-CM | POA: Diagnosis not present

## 2024-04-14 DIAGNOSIS — E43 Unspecified severe protein-calorie malnutrition: Secondary | ICD-10-CM | POA: Diagnosis not present

## 2024-04-14 DIAGNOSIS — J9601 Acute respiratory failure with hypoxia: Secondary | ICD-10-CM | POA: Diagnosis not present

## 2024-04-14 DIAGNOSIS — S22080D Wedge compression fracture of T11-T12 vertebra, subsequent encounter for fracture with routine healing: Secondary | ICD-10-CM | POA: Diagnosis not present

## 2024-04-14 DIAGNOSIS — R2689 Other abnormalities of gait and mobility: Secondary | ICD-10-CM | POA: Diagnosis not present

## 2024-04-14 DIAGNOSIS — J690 Pneumonitis due to inhalation of food and vomit: Secondary | ICD-10-CM | POA: Diagnosis not present

## 2024-04-14 DIAGNOSIS — R1310 Dysphagia, unspecified: Secondary | ICD-10-CM | POA: Diagnosis not present

## 2024-04-14 DIAGNOSIS — M6281 Muscle weakness (generalized): Secondary | ICD-10-CM | POA: Diagnosis not present

## 2024-04-14 DIAGNOSIS — E1022 Type 1 diabetes mellitus with diabetic chronic kidney disease: Secondary | ICD-10-CM | POA: Diagnosis not present

## 2024-04-15 ENCOUNTER — Emergency Department (HOSPITAL_COMMUNITY)

## 2024-04-15 ENCOUNTER — Inpatient Hospital Stay (HOSPITAL_COMMUNITY)
Admission: EM | Admit: 2024-04-15 | Discharge: 2024-04-25 | DRG: 193 | Disposition: A | Discharge status: Hospice | Attending: Internal Medicine | Admitting: Internal Medicine

## 2024-04-15 ENCOUNTER — Other Ambulatory Visit: Payer: Self-pay

## 2024-04-15 DIAGNOSIS — R627 Adult failure to thrive: Secondary | ICD-10-CM | POA: Diagnosis present

## 2024-04-15 DIAGNOSIS — M810 Age-related osteoporosis without current pathological fracture: Secondary | ICD-10-CM | POA: Diagnosis present

## 2024-04-15 DIAGNOSIS — Z978 Presence of other specified devices: Secondary | ICD-10-CM | POA: Diagnosis not present

## 2024-04-15 DIAGNOSIS — E440 Moderate protein-calorie malnutrition: Secondary | ICD-10-CM | POA: Diagnosis present

## 2024-04-15 DIAGNOSIS — Z91199 Patient's noncompliance with other medical treatment and regimen due to unspecified reason: Secondary | ICD-10-CM

## 2024-04-15 DIAGNOSIS — R0602 Shortness of breath: Secondary | ICD-10-CM

## 2024-04-15 DIAGNOSIS — R0689 Other abnormalities of breathing: Secondary | ICD-10-CM | POA: Diagnosis not present

## 2024-04-15 DIAGNOSIS — E10649 Type 1 diabetes mellitus with hypoglycemia without coma: Secondary | ICD-10-CM | POA: Diagnosis not present

## 2024-04-15 DIAGNOSIS — Z809 Family history of malignant neoplasm, unspecified: Secondary | ICD-10-CM

## 2024-04-15 DIAGNOSIS — L89152 Pressure ulcer of sacral region, stage 2: Secondary | ICD-10-CM | POA: Diagnosis present

## 2024-04-15 DIAGNOSIS — Z950 Presence of cardiac pacemaker: Secondary | ICD-10-CM

## 2024-04-15 DIAGNOSIS — I69828 Other speech and language deficits following other cerebrovascular disease: Secondary | ICD-10-CM | POA: Diagnosis not present

## 2024-04-15 DIAGNOSIS — R4701 Aphasia: Secondary | ICD-10-CM | POA: Diagnosis present

## 2024-04-15 DIAGNOSIS — Z515 Encounter for palliative care: Secondary | ICD-10-CM

## 2024-04-15 DIAGNOSIS — R569 Unspecified convulsions: Secondary | ICD-10-CM | POA: Diagnosis not present

## 2024-04-15 DIAGNOSIS — Z79899 Other long term (current) drug therapy: Secondary | ICD-10-CM

## 2024-04-15 DIAGNOSIS — H409 Unspecified glaucoma: Secondary | ICD-10-CM | POA: Diagnosis present

## 2024-04-15 DIAGNOSIS — Z7189 Other specified counseling: Secondary | ICD-10-CM | POA: Diagnosis not present

## 2024-04-15 DIAGNOSIS — Z66 Do not resuscitate: Secondary | ICD-10-CM | POA: Diagnosis not present

## 2024-04-15 DIAGNOSIS — E785 Hyperlipidemia, unspecified: Secondary | ICD-10-CM | POA: Diagnosis present

## 2024-04-15 DIAGNOSIS — R5381 Other malaise: Secondary | ICD-10-CM | POA: Diagnosis not present

## 2024-04-15 DIAGNOSIS — J9601 Acute respiratory failure with hypoxia: Secondary | ICD-10-CM | POA: Diagnosis not present

## 2024-04-15 DIAGNOSIS — R4182 Altered mental status, unspecified: Secondary | ICD-10-CM

## 2024-04-15 DIAGNOSIS — Z886 Allergy status to analgesic agent status: Secondary | ICD-10-CM

## 2024-04-15 DIAGNOSIS — Z96611 Presence of right artificial shoulder joint: Secondary | ICD-10-CM | POA: Diagnosis present

## 2024-04-15 DIAGNOSIS — K224 Dyskinesia of esophagus: Secondary | ICD-10-CM | POA: Diagnosis present

## 2024-04-15 DIAGNOSIS — L89313 Pressure ulcer of right buttock, stage 3: Secondary | ICD-10-CM | POA: Diagnosis not present

## 2024-04-15 DIAGNOSIS — R1310 Dysphagia, unspecified: Secondary | ICD-10-CM | POA: Diagnosis not present

## 2024-04-15 DIAGNOSIS — I69928 Other speech and language deficits following unspecified cerebrovascular disease: Secondary | ICD-10-CM | POA: Diagnosis not present

## 2024-04-15 DIAGNOSIS — Z1152 Encounter for screening for COVID-19: Secondary | ICD-10-CM | POA: Diagnosis not present

## 2024-04-15 DIAGNOSIS — Z5320 Procedure and treatment not carried out because of patient's decision for unspecified reasons: Secondary | ICD-10-CM | POA: Diagnosis present

## 2024-04-15 DIAGNOSIS — I2510 Atherosclerotic heart disease of native coronary artery without angina pectoris: Secondary | ICD-10-CM | POA: Diagnosis present

## 2024-04-15 DIAGNOSIS — E1065 Type 1 diabetes mellitus with hyperglycemia: Secondary | ICD-10-CM | POA: Diagnosis present

## 2024-04-15 DIAGNOSIS — I5032 Chronic diastolic (congestive) heart failure: Secondary | ICD-10-CM | POA: Diagnosis present

## 2024-04-15 DIAGNOSIS — R079 Chest pain, unspecified: Secondary | ICD-10-CM | POA: Diagnosis not present

## 2024-04-15 DIAGNOSIS — I672 Cerebral atherosclerosis: Secondary | ICD-10-CM | POA: Diagnosis not present

## 2024-04-15 DIAGNOSIS — I700 Atherosclerosis of aorta: Secondary | ICD-10-CM | POA: Diagnosis present

## 2024-04-15 DIAGNOSIS — Z9841 Cataract extraction status, right eye: Secondary | ICD-10-CM

## 2024-04-15 DIAGNOSIS — Z952 Presence of prosthetic heart valve: Secondary | ICD-10-CM | POA: Diagnosis not present

## 2024-04-15 DIAGNOSIS — R471 Dysarthria and anarthria: Secondary | ICD-10-CM | POA: Diagnosis present

## 2024-04-15 DIAGNOSIS — J189 Pneumonia, unspecified organism: Secondary | ICD-10-CM | POA: Diagnosis not present

## 2024-04-15 DIAGNOSIS — Z888 Allergy status to other drugs, medicaments and biological substances status: Secondary | ICD-10-CM

## 2024-04-15 DIAGNOSIS — R54 Age-related physical debility: Secondary | ICD-10-CM | POA: Diagnosis present

## 2024-04-15 DIAGNOSIS — Z881 Allergy status to other antibiotic agents status: Secondary | ICD-10-CM

## 2024-04-15 DIAGNOSIS — I350 Nonrheumatic aortic (valve) stenosis: Secondary | ICD-10-CM | POA: Diagnosis not present

## 2024-04-15 DIAGNOSIS — I959 Hypotension, unspecified: Secondary | ICD-10-CM | POA: Diagnosis not present

## 2024-04-15 DIAGNOSIS — I3481 Nonrheumatic mitral (valve) annulus calcification: Secondary | ICD-10-CM | POA: Diagnosis not present

## 2024-04-15 DIAGNOSIS — Z8249 Family history of ischemic heart disease and other diseases of the circulatory system: Secondary | ICD-10-CM

## 2024-04-15 DIAGNOSIS — Z681 Body mass index (BMI) 19 or less, adult: Secondary | ICD-10-CM | POA: Diagnosis not present

## 2024-04-15 DIAGNOSIS — Z961 Presence of intraocular lens: Secondary | ICD-10-CM | POA: Diagnosis present

## 2024-04-15 DIAGNOSIS — Z7982 Long term (current) use of aspirin: Secondary | ICD-10-CM

## 2024-04-15 DIAGNOSIS — I10 Essential (primary) hypertension: Secondary | ICD-10-CM | POA: Diagnosis not present

## 2024-04-15 DIAGNOSIS — J320 Chronic maxillary sinusitis: Secondary | ICD-10-CM | POA: Diagnosis not present

## 2024-04-15 DIAGNOSIS — Z9842 Cataract extraction status, left eye: Secondary | ICD-10-CM

## 2024-04-15 DIAGNOSIS — E43 Unspecified severe protein-calorie malnutrition: Secondary | ICD-10-CM | POA: Diagnosis not present

## 2024-04-15 DIAGNOSIS — M1990 Unspecified osteoarthritis, unspecified site: Secondary | ICD-10-CM | POA: Diagnosis present

## 2024-04-15 DIAGNOSIS — G40909 Epilepsy, unspecified, not intractable, without status epilepticus: Secondary | ICD-10-CM | POA: Diagnosis not present

## 2024-04-15 DIAGNOSIS — R918 Other nonspecific abnormal finding of lung field: Secondary | ICD-10-CM | POA: Diagnosis not present

## 2024-04-15 DIAGNOSIS — N183 Chronic kidney disease, stage 3 unspecified: Secondary | ICD-10-CM | POA: Diagnosis not present

## 2024-04-15 DIAGNOSIS — R561 Post traumatic seizures: Secondary | ICD-10-CM | POA: Diagnosis not present

## 2024-04-15 DIAGNOSIS — I110 Hypertensive heart disease with heart failure: Secondary | ICD-10-CM | POA: Diagnosis present

## 2024-04-15 DIAGNOSIS — L89323 Pressure ulcer of left buttock, stage 3: Secondary | ICD-10-CM | POA: Diagnosis not present

## 2024-04-15 DIAGNOSIS — R5383 Other fatigue: Secondary | ICD-10-CM | POA: Diagnosis not present

## 2024-04-15 DIAGNOSIS — I679 Cerebrovascular disease, unspecified: Secondary | ICD-10-CM | POA: Diagnosis not present

## 2024-04-15 DIAGNOSIS — G9389 Other specified disorders of brain: Secondary | ICD-10-CM | POA: Diagnosis not present

## 2024-04-15 DIAGNOSIS — G319 Degenerative disease of nervous system, unspecified: Secondary | ICD-10-CM | POA: Diagnosis not present

## 2024-04-15 DIAGNOSIS — E108 Type 1 diabetes mellitus with unspecified complications: Secondary | ICD-10-CM | POA: Diagnosis not present

## 2024-04-15 DIAGNOSIS — S2241XA Multiple fractures of ribs, right side, initial encounter for closed fracture: Secondary | ICD-10-CM | POA: Diagnosis not present

## 2024-04-15 DIAGNOSIS — Z794 Long term (current) use of insulin: Secondary | ICD-10-CM | POA: Diagnosis not present

## 2024-04-15 DIAGNOSIS — Z7401 Bed confinement status: Secondary | ICD-10-CM | POA: Diagnosis not present

## 2024-04-15 DIAGNOSIS — I6529 Occlusion and stenosis of unspecified carotid artery: Secondary | ICD-10-CM | POA: Diagnosis not present

## 2024-04-15 DIAGNOSIS — J181 Lobar pneumonia, unspecified organism: Secondary | ICD-10-CM | POA: Diagnosis not present

## 2024-04-15 DIAGNOSIS — E042 Nontoxic multinodular goiter: Secondary | ICD-10-CM | POA: Diagnosis not present

## 2024-04-15 DIAGNOSIS — Z9981 Dependence on supplemental oxygen: Secondary | ICD-10-CM

## 2024-04-15 LAB — CBC WITH DIFFERENTIAL/PLATELET
Abs Immature Granulocytes: 0.03 10*3/uL (ref 0.00–0.07)
Basophils Absolute: 0.1 10*3/uL (ref 0.0–0.1)
Basophils Relative: 1 %
Eosinophils Absolute: 0.1 10*3/uL (ref 0.0–0.5)
Eosinophils Relative: 1 %
HCT: 29.4 % — ABNORMAL LOW (ref 36.0–46.0)
Hemoglobin: 9.7 g/dL — ABNORMAL LOW (ref 12.0–15.0)
Immature Granulocytes: 0 %
Lymphocytes Relative: 14 %
Lymphs Abs: 1.4 10*3/uL (ref 0.7–4.0)
MCH: 28.5 pg (ref 26.0–34.0)
MCHC: 33 g/dL (ref 30.0–36.0)
MCV: 86.5 fL (ref 80.0–100.0)
Monocytes Absolute: 0.7 10*3/uL (ref 0.1–1.0)
Monocytes Relative: 7 %
Neutro Abs: 7.8 10*3/uL — ABNORMAL HIGH (ref 1.7–7.7)
Neutrophils Relative %: 77 %
Platelets: 256 10*3/uL (ref 150–400)
RBC: 3.4 MIL/uL — ABNORMAL LOW (ref 3.87–5.11)
RDW: 15.8 % — ABNORMAL HIGH (ref 11.5–15.5)
WBC: 10 10*3/uL (ref 4.0–10.5)
nRBC: 0 % (ref 0.0–0.2)

## 2024-04-15 LAB — COMPREHENSIVE METABOLIC PANEL WITH GFR
ALT: 23 U/L (ref 0–44)
AST: 19 U/L (ref 15–41)
Albumin: 2.1 g/dL — ABNORMAL LOW (ref 3.5–5.0)
Alkaline Phosphatase: 66 U/L (ref 38–126)
Anion gap: 7 (ref 5–15)
BUN: 26 mg/dL — ABNORMAL HIGH (ref 8–23)
CO2: 26 mmol/L (ref 22–32)
Calcium: 7.8 mg/dL — ABNORMAL LOW (ref 8.9–10.3)
Chloride: 99 mmol/L (ref 98–111)
Creatinine, Ser: 0.79 mg/dL (ref 0.44–1.00)
GFR, Estimated: >60 mL/min (ref >60)
Glucose, Bld: 234 mg/dL — ABNORMAL HIGH (ref 70–99)
Potassium: 4 mmol/L (ref 3.5–5.1)
Sodium: 132 mmol/L — ABNORMAL LOW (ref 135–145)
Total Bilirubin: 0.4 mg/dL (ref 0.0–1.2)
Total Protein: 5.6 g/dL — ABNORMAL LOW (ref 6.5–8.1)

## 2024-04-15 LAB — I-STAT CHEM 8, ED
BUN: 28 mg/dL — ABNORMAL HIGH (ref 8–23)
Calcium, Ion: 1.02 mmol/L — ABNORMAL LOW (ref 1.15–1.40)
Chloride: 91 mmol/L — ABNORMAL LOW (ref 98–111)
Creatinine, Ser: 0.8 mg/dL (ref 0.44–1.00)
Glucose, Bld: 179 mg/dL — ABNORMAL HIGH (ref 70–99)
HCT: 26 % — ABNORMAL LOW (ref 36.0–46.0)
Hemoglobin: 8.8 g/dL — ABNORMAL LOW (ref 12.0–15.0)
Potassium: 4 mmol/L (ref 3.5–5.1)
Sodium: 128 mmol/L — ABNORMAL LOW (ref 135–145)
TCO2: 25 mmol/L (ref 22–32)

## 2024-04-15 LAB — ETHANOL: Alcohol, Ethyl (B): <15 mg/dL (ref <15)

## 2024-04-15 LAB — TROPONIN I (HIGH SENSITIVITY)
Troponin I (High Sensitivity): 14 ng/L (ref <18)
Troponin I (High Sensitivity): 9 ng/L (ref <18)

## 2024-04-15 LAB — APTT: aPTT: 32 s (ref 24–36)

## 2024-04-15 LAB — RESP PANEL BY RT-PCR (RSV, FLU A&B, COVID)  RVPGX2
Influenza A by PCR: NEGATIVE
Influenza B by PCR: NEGATIVE
Resp Syncytial Virus by PCR: NEGATIVE
SARS Coronavirus 2 by RT PCR: NEGATIVE

## 2024-04-15 LAB — D-DIMER, QUANTITATIVE: D-Dimer, Quant: 2.37 ug{FEU}/mL — ABNORMAL HIGH (ref 0.00–0.50)

## 2024-04-15 LAB — CBG MONITORING, ED
Glucose-Capillary: 176 mg/dL — ABNORMAL HIGH (ref 70–99)
Glucose-Capillary: 90 mg/dL (ref 70–99)

## 2024-04-15 LAB — PROTIME-INR
INR: 1.4 — ABNORMAL HIGH (ref 0.8–1.2)
Prothrombin Time: 17.7 s — ABNORMAL HIGH (ref 11.4–15.2)

## 2024-04-15 LAB — BRAIN NATRIURETIC PEPTIDE: B Natriuretic Peptide: 264.9 pg/mL — ABNORMAL HIGH (ref 0.0–100.0)

## 2024-04-15 LAB — MAGNESIUM: Magnesium: 1.9 mg/dL (ref 1.7–2.4)

## 2024-04-15 MED ORDER — INSULIN ASPART 100 UNIT/ML IJ SOLN: 0.0000 [IU] | INTRAMUSCULAR | Status: DC

## 2024-04-15 MED ORDER — ENOXAPARIN SODIUM 30 MG/0.3ML IJ SOSY
30.0000 mg | PREFILLED_SYRINGE | INTRAMUSCULAR | Status: DC
  Administered 2024-04-15 – 2024-04-24 (×10): 30 mg via SUBCUTANEOUS
  Filled 2024-04-15 (×10): qty 0.3

## 2024-04-15 MED ORDER — INSULIN ASPART 100 UNIT/ML IJ SOLN
0.0000 [IU] | Freq: Three times a day (TID) | INTRAMUSCULAR | Status: DC
  Administered 2024-04-16: 3 [IU] via SUBCUTANEOUS
  Administered 2024-04-16: 7 [IU] via SUBCUTANEOUS

## 2024-04-15 MED ORDER — SODIUM CHLORIDE 0.9 % IV SOLN
2.0000 g | Freq: Once | INTRAVENOUS | Status: AC
  Administered 2024-04-15: 2 g via INTRAVENOUS
  Filled 2024-04-15: qty 10

## 2024-04-15 MED ORDER — INSULIN ASPART 100 UNIT/ML IJ SOLN: 0.0000 [IU] | Freq: Every day | INTRAMUSCULAR | Status: DC

## 2024-04-15 MED ORDER — POLYETHYLENE GLYCOL 3350 17 G PO PACK: 17.0000 g | PACK | Freq: Every day | ORAL | Status: DC | PRN

## 2024-04-15 MED ORDER — GLUCERNA SHAKE PO LIQD
237.0000 mL | Freq: Three times a day (TID) | ORAL | Status: DC
  Administered 2024-04-15 – 2024-04-25 (×23): 237 mL via ORAL
  Filled 2024-04-15: qty 237

## 2024-04-15 MED ORDER — INSULIN GLARGINE-YFGN 100 UNIT/ML ~~LOC~~ SOLN
5.0000 [IU] | Freq: Two times a day (BID) | SUBCUTANEOUS | Status: DC
  Administered 2024-04-16 – 2024-04-18 (×5): 5 [IU] via SUBCUTANEOUS
  Filled 2024-04-15 (×9): qty 0.05

## 2024-04-15 MED ORDER — ACETAMINOPHEN 325 MG PO TABS
650.0000 mg | ORAL_TABLET | Freq: Four times a day (QID) | ORAL | Status: DC | PRN
  Administered 2024-04-16 – 2024-04-22 (×7): 650 mg via ORAL
  Filled 2024-04-15 (×7): qty 2

## 2024-04-15 MED ORDER — SIMVASTATIN 20 MG PO TABS
40.0000 mg | ORAL_TABLET | Freq: Every day | ORAL | Status: DC
  Administered 2024-04-15 – 2024-04-24 (×10): 40 mg via ORAL
  Filled 2024-04-15 (×10): qty 2

## 2024-04-15 MED ORDER — INSULIN ASPART 100 UNIT/ML IJ SOLN
3.0000 [IU] | Freq: Three times a day (TID) | INTRAMUSCULAR | Status: DC
  Administered 2024-04-16 – 2024-04-17 (×4): 3 [IU] via SUBCUTANEOUS

## 2024-04-15 MED ORDER — SODIUM CHLORIDE 0.9 % IV SOLN: INTRAVENOUS | Status: DC

## 2024-04-15 MED ORDER — MELATONIN 5 MG PO TABS
5.0000 mg | ORAL_TABLET | Freq: Every evening | ORAL | Status: DC | PRN
  Administered 2024-04-15 – 2024-04-20 (×2): 5 mg via ORAL
  Filled 2024-04-15 (×2): qty 1

## 2024-04-15 MED ORDER — IOHEXOL 350 MG/ML SOLN
120.0000 mL | Freq: Once | INTRAVENOUS | Status: AC | PRN
  Administered 2024-04-15: 120 mL via INTRAVENOUS

## 2024-04-15 MED ORDER — PROCHLORPERAZINE EDISYLATE 10 MG/2ML IJ SOLN
5.0000 mg | Freq: Four times a day (QID) | INTRAMUSCULAR | Status: DC | PRN
  Administered 2024-04-18 – 2024-04-22 (×3): 5 mg via INTRAVENOUS
  Filled 2024-04-15 (×4): qty 2

## 2024-04-15 MED ORDER — ASPIRIN 81 MG PO TBEC
81.0000 mg | DELAYED_RELEASE_TABLET | Freq: Every day | ORAL | Status: DC
  Administered 2024-04-16 – 2024-04-18 (×2): 81 mg via ORAL
  Filled 2024-04-15 (×3): qty 1

## 2024-04-15 MED ORDER — SODIUM CHLORIDE 0.9 % IV SOLN: INTRAVENOUS | Status: AC

## 2024-04-15 NOTE — Consult Note (Signed)
 NEUROLOGY CONSULT NOTE   Date of service: Apr 15, 2024 Patient Name: Danielle Clarke MRN:  [id number] DOB:  1941/10/31 Chief Complaint: "Code Stroke" Requesting Provider: Sallyanne Creamer, DO  History of Present Illness  Danielle Clarke is a 83 y.o. female with hx of post TAVR, PPM, CAD, hypertension, traumatic ICH, diabetes who initially presents with shortness of breath and chest tightness.  She was discharged from rehab facility 2 days ago and family has been trying to titrate down her oxygen. Family at the bedside reported concern about her breathing and possible aspiration as her initial reason for coming to the hospital. Family states that the patient had been frustrated over not being able to eat and go home when she had an abrupt change in her mental status.  Family reports that she had a rightward gaze, was aphasic. On EDP exam he noted that she able to lift her right side to command but not able to move her left side to command, expressive aphasia, right-sided gaze.  She does have postsurgical pupils from cataract surgery years ago.  In 2020 she had a traumatic left frontal intraparenchymal hematoma with mixed subdural and subarachnoid blood after a syncopal episode at the dentist office.  She was followed by neurosurgery and recovered well from this.  Family reports she did still have some difficulty with speech in the form of paraphasic errors or inappropriate word choice however she is able to communicate effectively.  She was discharged to a skilled nursing facility on 5/9 after hospitalization due to a choking episode with unresponsiveness.  She was discharged on a dysphagia diet and on 2 L of oxygen.  She was additionally treated for aspiration pneumonia and shingles.  Additionally she does have a T12 compression fracture does have a TLSO for when she is out of bed.  On 3/16 she was seen as a code stroke and was found to be hypoglycemic, her symptoms did resolve with correction of her  blood glucose and she was discharged home.  LKW: 1725 Modified rankin score: 2-Slight disability-UNABLE to perform all activities but does not need assistance- per family  IV Thrombolysis: No, too mild to treat after her symptoms rapidly improved EVT: NO, no LVO  NIHSS components Score: Comment  1a Level of Conscious 0[x]  1[]  2[]  3[]      1b LOC Questions 0[x]  1[]  2[]       1c LOC Commands 0[]  1[]  2[x]       2 Best Gaze 0[x]  1[]  2[]       3 Visual 0[x]  1[]  2[]  3[]      4 Facial Palsy 0[]  1[x]  2[]  3[]      5a Motor Arm - left 0[]  1[]  2[x]  3[]  4[]  UN[]    5b Motor Arm - Right 0[]  1[]  2[x]  3[]  4[]  UN[]    6a Motor Leg - Left 0[]  1[]  2[x]  3[]  4[]  UN[]    6b Motor Leg - Right 0[]  1[]  2[x]  3[]  4[]  UN[]    7 Limb Ataxia 0[x]  1[]  2[]  UN[]      8 Sensory 0[x]  1[]  2[]  UN[]      9 Best Language 0[x]  1[]  2[]  3[]      10 Dysarthria 0[]  1[x]  2[]  UN[]      11 Extinct. and Inattention 0[x]  1[]  2[]       TOTAL:12       ROS   Unable to ascertain due to Altered Mental Status  Past History   Past Medical History:  Diagnosis Date   Aortic stenosis, mild    Arthritis  Asthmatic bronchitis    with colds per patient   Carotid artery disease (HCC)    40-59% bilateral ICA stenosis   Cataract    Cutaneous abscess of right foot    Glaucoma    Heart murmur    Dr Felipe Horton is her  cardiologist.    Hyperlipemia    Hypertension    Dr. 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 Lorie Rook and Dr. Honey Lusty   Syncope    Type 1 diabetes mellitus [facility])     Past Surgical History:  Procedure Laterality Date   ANKLE FRACTURE SURGERY Left 2001   steel plate and 3 screws    ANTERIOR CERVICAL CORPECTOMY N/A 07/31/2014   Procedure: Cervical Four to Cervical Six Corpectomy;  Surgeon: Adelbert Adler, MD;  Location: [facility];  Service: Neurosurgery;  Laterality: N/A;  C4 to C6 Corpectomy    BREAST SURGERY  1988   CATARACT EXTRACTION W/ INTRAOCULAR LENS  IMPLANT, BILATERAL Bilateral 2011   right and then left   EYE SURGERY     FRACTURE SURGERY     HAMMER TOE SURGERY  1998   I & D EXTREMITY Right 09/27/2018   Procedure: IRRIGATION AND DEBRIDEMENT RIGHT FOOT;  Surgeon: Timothy Ford, MD;  Location: [facility] OR;  Service: Orthopedics;  Laterality: Right;   INTRAOPERATIVE TRANSTHORACIC ECHOCARDIOGRAM N/A 02/06/2023   Procedure: INTRAOPERATIVE TRANSTHORACIC ECHOCARDIOGRAM;  Surgeon: Kyra Phy, MD;  Location: [facility] OR;  Service: Open Heart Surgery;  Laterality: N/A;   JOINT REPLACEMENT     PACEMAKER IMPLANT N/A 02/10/2019   Procedure: PACEMAKER IMPLANT;  Surgeon: Tammie Fall, MD;  Location: [facility];  Service: Cardiovascular;  Laterality: N/A;   RIGHT/LEFT HEART CATH AND CORONARY ANGIOGRAPHY N/A 01/11/2023   Procedure: RIGHT/LEFT HEART CATH AND CORONARY ANGIOGRAPHY;  Surgeon: Kyra Phy, MD;  Location: [facility];  Service: Cardiovascular;  Laterality: N/A;   TONSILLECTOMY AND ADENOIDECTOMY  1948   TOTAL SHOULDER REPLACEMENT  2010   right shoulder    TRANSCATHETER AORTIC VALVE REPLACEMENT, TRANSFEMORAL N/A 02/06/2023   Procedure: Transcatheter Aortic Valve Replacement, Transfemoral;  Surgeon: Thukkani, Arun K, MD;  Location: [facility] OR;  Service: Open Heart Surgery;  Laterality: N/A;   TUBAL LIGATION  1979   TYMPANOSTOMY TUBE PLACEMENT Bilateral     Family History: Family History  Problem Relation Age of Onset   Cancer Mother    Heart Problems Father     Social History  reports that she has never smoked. She has never used smokeless tobacco. She reports current alcohol use of about 2.0 standard drinks of alcohol per week. She reports that she does not use drugs.  Allergies  Allergen Reactions   Cefaclor Anaphylaxis    Tolerated cephalexin in 2018   Tetracycline Anaphylaxis   Vancomycin Anaphylaxis   Alendronate Sodium Other (See Comments)   Augmentin  [Amoxicillin-Pot Clavulanate] Other (See Comments)    Reaction unknown >> Anaphylaxis Has patient had a PCN reaction causing immediate rash, facial/tongue/throat swelling, SOB or lightheadedness with hypotension: Unknown Has patient had a PCN reaction causing severe rash involving mucus membranes or skin necrosis: Unknown Has patient had a PCN reaction that required hospitalization: Unknown Has patient had a PCN reaction occurring within the last 10 years: Unknown If all of the above answers are "NO",  then may proceed with Cephalosporin use.    Nsaids     CKD   Quinapril  Hcl     elevated potassium   Sulfamethoxazole -Trimethoprim      decreased kidney function   Prednisone Other (See Comments)    Reaction unknown    Medications  No current facility-administered medications for this encounter.  Current Outpatient Medications:    aspirin  EC 81 MG tablet, Take 1 tablet (81 mg total) by mouth daily. Swallow whole., Disp: 30 tablet, Rfl: 12   Continuous Glucose Receiver (DEXCOM G7 RECEIVER) DEVI, See admin instructions. (Patient not taking: Reported on 02/15/2024), Disp: , Rfl:    Continuous Glucose Sensor (DEXCOM G7 SENSOR) MISC, , Disp: , Rfl:    HUMALOG KWIKPEN 100 UNIT/ML KwikPen, Inject 0-10 Units into the skin 3 (three) times daily as needed (high blood sugar). Sliding Scale, Disp: , Rfl:    Insulin  Glargine (LANTUS ) 100 UNIT/ML Solostar Pen, Inject 13 Units into the skin daily. (Patient taking differently: Inject 10-12 Units into the skin in the morning.), Disp: 15 mL, Rfl: 11   megestrol  (MEGACE ) 20 MG tablet, Take 20 mg by mouth 2 (two) times daily., Disp: , Rfl:    Menthol -Methyl Salicylate (SALONPAS PAIN RELIEF PATCH) PTCH, Apply 1 patch topically daily as needed (patch)., Disp: , Rfl:    methocarbamol (ROBAXIN) 750 MG tablet, Take 750 mg by mouth 4 (four) times daily., Disp: , Rfl:    metoprolol  tartrate (LOPRESSOR ) 25 MG tablet, Take 1 tablet (25 mg total) by mouth as directed for  1 dose. (Patient not taking: Reported on 04/01/2024), Disp: 1 tablet, Rfl: 0   protein supplement shake (PREMIER PROTEIN) LIQD, Take 11 oz by mouth 2 (two) times daily. 30 mg of protein, Disp: , Rfl:    simvastatin  (ZOCOR ) 40 MG tablet, Take 40 mg by mouth at bedtime., Disp: , Rfl:    traMADol  (ULTRAM ) 50 MG tablet, Take 1 tablet (50 mg total) by mouth every 6 (six) hours as needed., Disp: 10 tablet, Rfl: 0   valACYclovir (VALTREX) 1000 MG tablet, Take 1,000 mg by mouth 2 (two) times daily., Disp: , Rfl:   Vitals   Vitals:   04/15/24 1531 04/15/24 1534  BP:  (!) 120/50  Pulse:  79  Resp:  16  Temp:  98.3 F (36.8 C)  TempSrc:  Oral  SpO2:  100%  Weight: 39.5 kg   Height: 5' (1.524 m)     Body mass index is 17.01 kg/m.  Physical Exam   Constitutional: Thin, ill-appearing Psych: Flat affect, frustrated appearing Eyes: No scleral injection.  HENT: No OP obstruction.  Head: Normocephalic.  Cardiovascular: Normal rate and regular rhythm.  Respiratory: Effort normal, non-labored breathing.  GI: Soft.  No distension. There is no tenderness.  Skin: WDI.   Neurologic Examination   Neuro: Mental Status: Patient has eyes open, requires stimulation to answer questions.  She is oriented to person, place, month, year, and situation. She is initially dysarthric and slow to answer questions.  On repeat exam she is able to repeat and identify objects correctly, though she is slow to respond and hypophonic.  Difficult to assess how much of her lack of participation in examination is volitional; she expresses a strong desire to leave the hospital  Cranial Nerves: II: Blinks to threat bilaterally, right pupil is 5 mm and nonreactive, left pupil is 3 mm and nonreactive (versus minimally/slightly reactive bilaterally) III,IV, VI: EOMI without ptosis or diploplia.  VII: Facial movement is symmetric, daughter notes  left facial droop at baseline when she is sleeping VIII: Hearing is intact to  voice X: Voice is hypophonic XI: Head is midline XII: Tongue protrudes midline without atrophy or fasciculations.  Sensory/motor: Tone is normal. Bulk is normal. Non participatory in exam but resists passive ROM at least 4/5 bilaterally in the upper extremities.  Briskly withdraws bilateral feet to light tickle Cerebellar: Nonparticipatory         Labs/Imaging/Neurodiagnostic studies   CBC:  Recent Labs  Lab 2024/04/17 1526  WBC 10.0  NEUTROABS 7.8*  HGB 9.7*  HCT 29.4*  MCV 86.5  PLT 256   Basic Metabolic Panel:  Lab Results  Component Value Date   NA 132 (L) 17-Apr-2024   K 4.0 04-17-24   CO2 26 2024/04/17   GLUCOSE 234 (H) 04/17/24   BUN 26 (H) 04/17/24   CREATININE 0.79 04-17-2024   CALCIUM 7.8 (L) 04/17/24   GFRNONAA >60 04/17/2024   GFRAA >60 02/25/2019   Lipid Panel:  Lab Results  Component Value Date   LDLCALC 71 10/23/2023   HgbA1c:  Lab Results  Component Value Date   HGBA1C 9.7 (H) 04/01/2024   Urine Drug Screen: No results found for: "LABOPIA", "COCAINSCRNUR", "LABBENZ", "AMPHETMU", "THCU", "LABBARB"  Alcohol Level     Component Value Date/Time   ETH <10 02/10/2024 2235   INR  Lab Results  Component Value Date   INR 1.2 04/02/2024   APTT  Lab Results  Component Value Date   APTT 30 04/02/2024   AED levels: No results found for: "PHENYTOIN", "ZONISAMIDE", "LAMOTRIGINE", "LEVETIRACETA"  Non-contrast head CT: 1. No acute intracranial finding. 2. 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 3. Generalized cerebral atrophy. 4. Paranasal sinus disease as described (most notably, severe right frontal and left maxillary sinusitis).   CTA neck: 1. The common carotid and internal carotid arteries are patent within the neck without stenosis. Atherosclerotic plaque bilaterally, as described. 2. Streak/beam hardening artifact  partially obscures the left vertebral artery proximal V1 segment. Within this limitation, the vertebral arteries are patent within the neck. A sclerotic plaque within the bilateral vertebral arteries at the V3/V4 junction (no more than mild stenosis on the right, non-stenotic on the left).  3. Aortic Atherosclerosis (ICD10-I70.0). 4. Heterogeneous and mildly enlarged thyroid  gland. Given the patient's age, a non-emergent thyroid  ultrasound may be obtained for further evaluation as clinically appropriate. Reference: J Am Coll Radiol. 2015 Feb;12(2): 143-50.   CTA head: Intracranial atherosclerotic disease as described. No proximal intracranial large vessel occlusion or high-grade proximal arterial stenosis identified.  CTA Chest 1. No evidence of pulmonary embolism. 2. Bilateral lower lobe tree-in-bud nodules, right-greater-than-left, and subpleural right lower lobe consolidation, likely aspiration with developing right lower lobe pneumonia. 3. A 1.7 x 0.9 cm ovoid density within the right breast. Recommend correlation with dedicated breast imaging, as clinically indicated.  MRI Brain(Personally reviewed): Pending   Neurodiagnostics rEEG:  Pending   ASSESSMENT   TANSY LOREK is a 83 y.o. female who initially presented with shortness of breath and then had an acute mental status change while in the emergency department.  Given her history of IPH and symptoms there is concern for focal seizure activity.  She is rapidly returning to baseline, family does state that she is still not speaking as well as she typically does but they agree that her symptoms are nondisabling and therefore we will not pursue thrombolytic.  She did still have  some residual difficulties with her speech after her hemorrhagic stroke, however she had been relatively independent until her last hospitalization.  Plan for MRI brain and EEG tomorrow.  RECOMMENDATIONS  - Plan for MRI tomorrow due to pacemaker - Routine  EEG  - Every 2 hour neurochecks pending MRI - Hold off on antiseizure medications for now to improve diagnostic clarity of EEG, but if she has recurrent events overnight please contact neurologist on-call with whom I have discussed this patient - Appreciate management of comorbidities per ED/primary team - Neurology will follow along ______________________________________________________________________   Signed, Imogene Mana, NP [facility]  Attending Neurologist's note:  I personally saw this patient, gathering history, performing a full neurologic examination, reviewing relevant labs, personally reviewing relevant imaging including head CT, CTA head and neck, and formulated the assessment and plan, adding the note above for completeness and clarity to accurately reflect my thoughts  CRITICAL CARE Performed by: Ronnette Coke   Total critical care time: 45 minutes  Critical care time was exclusive of separately billable procedures and treating other patients.  Critical care was necessary to treat or prevent imminent or life-threatening deterioration -- emergent evaluation for consideration of thrombectomy or thrombolytic  Critical care was time spent personally by me on the following activities: development of treatment plan with patient and/or surrogate as well as nursing, discussions with consultants, evaluation of patient's response to treatment, examination of patient, obtaining history from patient or surrogate, ordering and performing treatments and interventions, ordering and review of laboratory studies, ordering and review of radiographic studies, pulse oximetry and re-evaluation of patient's condition.

## 2024-04-15 NOTE — ED Notes (Signed)
 Pacemaker interrogated and fax number provided for [employer].

## 2024-04-15 NOTE — ED Notes (Signed)
 MD at bedside.

## 2024-04-15 NOTE — ED Notes (Signed)
 Neuro NP at bedside

## 2024-04-15 NOTE — ED Provider Notes (Signed)
 [facility] EMERGENCY DEPARTMENT AT [facility] Provider Note   CSN: [id number] Arrival date & time: 04/15/24  1523  An emergency department physician performed an initial assessment on this suspected stroke patient at 1740.  History  Chief Complaint  Patient presents with   Code Stroke    Danielle Clarke is a 83 y.o. female.  With a history of status post TAVR, CAD, hypertension, diabetes who presents to the ED for shortness of breath.  Patient was recently admitted to this facility for aspiration pneumonia.  She was discharged to a rehab facility and came home 2 days ago.  They have been trying to wean down her oxygen.  She felt very short of breath with some chest tightness on 1 L of oxygen today titrated down from 2.  Her family became concerned with the symptoms and wanted her evaluated in the emergency department.  No chest pain or shortness of breath currently.  No fevers chills.  Sacral wound is healing and left foot heel pressure ulcer also healing.  No anticoagulation or falls   Chest Pain Associated symptoms: shortness of breath   Shortness of Breath Associated symptoms: chest pain        Home Medications Prior to Admission medications   Medication Sig Start Date End Date Taking? Authorizing Provider  Amino Acids -Protein Hydrolys (PRO-STAT AWC) LIQD Take 30 mLs by mouth daily.   Yes [provider]  aspirin  EC 81 MG tablet Take 1 tablet (81 mg total) by mouth daily. Swallow whole. 12/14/22  Yes Thukkani, Arun K, MD  dextrose  (GLUTOSE) 40 % GEL Take 15 g by mouth See admin instructions. Give 15g by mouth every hour as needed for hypoglycemia - recheck blood sugar in 30 minutes.   Yes [provider]  Insulin  Glargine (LANTUS ) 100 UNIT/ML Solostar Pen Inject 13 Units into the skin daily. Patient taking differently: Inject 16 Units into the skin daily. 02/28/19  Yes Angiulli, Everlyn Hockey, PA-C  insulin  lispro (HUMALOG) 100 UNIT/ML injection Inject  0-12 Units into the skin See admin instructions. Inject 0-12 units subcutaneously before meals and at bedtime per sliding scale: < 70 : contact provider 70-200 : 0 units 201-250 : 2 units 251-300 : 4 units 301-350 : 6 units 351-400 : 8 units 401-450 : 10 units 451-600 : 12 units - recheck blood sugar in 2 hours, if BS > 350 or < 100, notify provider.   Yes [provider]  lidocaine  (LIDODERM ) 5 % Place 1 patch onto the skin daily as needed (lower back pain).   Yes [provider]  megestrol  (MEGACE ) 40 MG tablet Take 20 mg by mouth 2 (two) times daily.   Yes [provider]  methocarbamol (ROBAXIN) 750 MG tablet Take 750 mg by mouth 4 (four) times daily.   Yes [provider]  metoprolol  tartrate (LOPRESSOR ) 25 MG tablet Take 1 tablet (25 mg total) by mouth as directed for 1 dose. 02/16/24  Yes Ardia Kraft, PA-C  simvastatin  (ZOCOR ) 40 MG tablet Take 40 mg by mouth at bedtime. 03/20/20  Yes [provider]  traMADol  (ULTRAM ) 50 MG tablet Take 1 tablet (50 mg total) by mouth every 6 (six) hours as needed. 04/04/24  Yes Gherghe, Costin M, MD  clindamycin  (CLEOCIN ) 300 MG capsule Take 300 mg by mouth 3 (three) times daily. Patient not taking: Reported on 04/15/2024    [provider]      Allergies    Augmentin [amoxicillin-pot clavulanate], Ceclor [  cefaclor], Firvanq [vancomycin], Sumycin [tetracycline], Accupril  [quinapril  hcl], Bactrim  [sulfamethoxazole -trimethoprim ], Fosamax [alendronate sodium], Nsaids, and Deltasone [prednisone]    Review of Systems   Review of Systems  Respiratory:  Positive for shortness of breath.   Cardiovascular:  Positive for chest pain.    Physical Exam Updated Vital Signs BP (!) 125/41   Pulse 69   Temp 97.8 F (36.6 C) (Oral)   Resp 16   Ht 5' (1.524 m)   Wt 39.5 kg   SpO2 100%   BMI 17.01 kg/m  Physical Exam Vitals and nursing note reviewed.  Constitutional:      Comments: Frail  HENT:      Head: Normocephalic and atraumatic.  Eyes:     Pupils: Pupils are equal, round, and reactive to light.  Cardiovascular:     Rate and Rhythm: Normal rate and regular rhythm.  Pulmonary:     Effort: Pulmonary effort is normal.     Breath sounds: Normal breath sounds.  Abdominal:     Palpations: Abdomen is soft.     Tenderness: There is no abdominal tenderness.  Musculoskeletal:     Comments: Pressure ulcer left heel  Skin:    General: Skin is warm and dry.  Neurological:     General: No focal deficit present.     Mental Status: She is alert and oriented to person, place, and time.     Motor: No weakness.  Psychiatric:        Mood and Affect: Mood normal.     ED Results / Procedures / Treatments   Labs (all labs ordered are listed, but only abnormal results are displayed) Labs Reviewed  COMPREHENSIVE METABOLIC PANEL WITH GFR - Abnormal; Notable for the following components:      Result Value   Sodium 132 (*)    Glucose, Bld 234 (*)    BUN 26 (*)    Calcium 7.8 (*)    Total Protein 5.6 (*)    Albumin 2.1 (*)    All other components within normal limits  BRAIN NATRIURETIC PEPTIDE - Abnormal; Notable for the following components:   B Natriuretic Peptide 264.9 (*)    All other components within normal limits  CBC WITH DIFFERENTIAL/PLATELET - Abnormal; Notable for the following components:   RBC 3.40 (*)    Hemoglobin 9.7 (*)    HCT 29.4 (*)    RDW 15.8 (*)    Neutro Abs 7.8 (*)    All other components within normal limits  D-DIMER, QUANTITATIVE - Abnormal; Notable for the following components:   D-Dimer, Quant 2.37 (*)    All other components within normal limits  PROTIME-INR - Abnormal; Notable for the following components:   Prothrombin Time 17.7 (*)    INR 1.4 (*)    All other components within normal limits  CBG MONITORING, ED - Abnormal; Notable for the following components:   Glucose-Capillary 176 (*)    All other components within normal limits  I-STAT  CHEM 8, ED - Abnormal; Notable for the following components:   Sodium 128 (*)    Chloride 91 (*)    BUN 28 (*)    Glucose, Bld 179 (*)    Calcium, Ion 1.02 (*)    Hemoglobin 8.8 (*)    HCT 26.0 (*)    All other components within normal limits  RESP PANEL BY RT-PCR (RSV, FLU A&B, COVID)  RVPGX2  MAGNESIUM   ETHANOL  APTT  TROPONIN I (HIGH SENSITIVITY)  TROPONIN I (HIGH SENSITIVITY)  EKG EKG Interpretation Date/Time:  Tuesday Apr 15 2024 15:38:08 EDT Ventricular Rate:  79 PR Interval:  155 QRS Duration:  109 QT Interval:  437 QTC Calculation: 501 R Axis:   3  Text Interpretation: Sinus rhythm LVH with secondary repolarization abnormality Probable anterior infarct, age indeterminate Prolonged QT interval Confirmed by Rafael Bun [phone]) on 04/15/2024 7:32:16 PM  Radiology CT Angio Chest PE W and/or Wo Contrast Result Date: 04/15/2024 CLINICAL DATA:  Chest pain and shortness of breath EXAM: CT ANGIOGRAPHY CHEST WITH CONTRAST TECHNIQUE: Multidetector CT imaging of the chest was performed using the standard protocol during bolus administration of intravenous contrast. Multiplanar CT image reconstructions and MIPs were obtained to evaluate the vascular anatomy. RADIATION DOSE REDUCTION: This exam was performed according to the departmental dose-optimization program which includes automated exposure control, adjustment of the mA and/or kV according to patient size and/or use of iterative reconstruction technique. CONTRAST:  OMNIPAQUE  IOHEXOL  350 MG/ML SOLN COMPARISON:  Same day chest radiograph, CTA chest dated 01/18/2023 FINDINGS: Cardiovascular: Left anterior chest wall pacemaker leads terminate in the right atrium and right ventricle. Status post aortic valve replacement. Extensive mitral annular calcifications. The study is high quality for the evaluation of pulmonary embolism. There are no filling defects in the central, lobar, segmental or subsegmental pulmonary artery branches  to suggest acute pulmonary embolism. Great vessels are normal in course and caliber. Normal heart size. No significant pericardial fluid/thickening. Coronary artery calcifications and aortic atherosclerosis. Mediastinum/Nodes: Heterogeneous thyroid  gland. Mildly patulous esophagus. No pathologically enlarged axillary, supraclavicular, mediastinal, or hilar lymph nodes. Lungs/Pleura: The central airways are patent. Mild diffuse bronchial wall thickening. Bilateral lower lobe tree-in-bud nodules, right-greater-than-left. Subpleural right lower lobe consolidation. No pneumothorax. No pleural effusion. Upper abdomen: Normal. Musculoskeletal: No acute or abnormal lytic or blastic osseous lesions. Multilevel degenerative changes of the thoracic spine. Partially imaged right shoulder arthroplasty. 1.7 x 0.9 cm ovoid density within the right breast (20:97). Partially imaged cervical spinal fixation hardware appears intact. Review of the MIP images confirms the above findings. IMPRESSION: 1. No evidence of pulmonary embolism. 2. Bilateral lower lobe tree-in-bud nodules, right-greater-than-left, and subpleural right lower lobe consolidation, likely aspiration with developing right lower lobe pneumonia. 3. A 1.7 x 0.9 cm ovoid density within the right breast. Recommend correlation with dedicated breast imaging, as clinically indicated. Aortic Atherosclerosis (ICD10-I70.0). Coronary artery calcifications. Assessment for potential risk factor modification, dietary therapy or pharmacologic therapy may be warranted, if clinically indicated. Electronically Signed   By: Limin  Xu M.D.   On: 04/15/2024 19:22   CT ANGIO HEAD NECK W WO CM (CODE STROKE) Result Date: 04/15/2024 CLINICAL DATA:  Provided history: Neuro deficit, acute, stroke suspected. EXAM: CT ANGIOGRAPHY HEAD AND NECK WITH AND WITHOUT CONTRAST TECHNIQUE: Multidetector CT imaging of the head and neck was performed using the standard protocol during bolus administration  of intravenous contrast. Multiplanar CT image reconstructions and MIPs were obtained to evaluate the vascular anatomy. Carotid stenosis measurements (when applicable) are obtained utilizing NASCET criteria, using the distal internal carotid diameter as the denominator. RADIATION DOSE REDUCTION: This exam was performed according to the departmental dose-optimization program which includes automated exposure control, adjustment of the mA and/or kV according to patient size and/or use of iterative reconstruction technique. CONTRAST:  OMNIPAQUE  IOHEXOL  350 MG/ML SOLN COMPARISON:  Non-contrast head CT and CT angiogram head/neck 02/10/2024. FINDINGS: CT HEAD FINDINGS Brain: Generalized cerebral atrophy. 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 There is no acute intracranial hemorrhage. No acute demarcated cortical infarct. No extra-axial fluid collection. No evidence of an intracranial mass. No midline shift. Vascular: No hyperdense vessel.  Atherosclerotic calcifications. Skull: No calvarial fracture or aggressive osseous lesion. Sinuses/Orbits: Severe right frontal sinusitis (with complete sinus opacification). Mild bilateral ethmoid sinusitis. Severe left maxillary sinusitis (with associated chronic reactive osteitis). No acute intracranial finding. These results were communicated to Dr. Cleone Dad At 6:01 pmon 5/20/2025by text page via the [facility] messaging system. Review of the MIP images confirms the above findings CTA NECK FINDINGS Aortic arch: Standard aortic branching (correlating with the prior CTA of 02/10/2024). The innominate artery origin is excluded from the field of view on the current study. The visualized thoracic aorta is normal in caliber. Atherosclerotic plaque within the visualized aortic arch and proximal major branch vessels of the neck. Streak/beam hardening artifact arising from a dense  contrast bolus partially obscures the right subclavian artery. Within described limitations, no hemodynamically significant innominate or proximal subclavian artery stenosis is identified. Right carotid system: CCA and ICA patent within the neck without measurable stenosis. Atherosclerotic plaque about the carotid bifurcation, within the proximal ICA and within the proximal ECA. Left carotid system: CCA and ICA patent within the neck without measurable stenosis. Atherosclerotic plaque about the carotid bifurcation, within the proximal ICA and within the proximal ECA. Vertebral arteries: Streak/beam hardening artifact arising from a right shoulder prosthesis partially obscures the left vertebral artery proximal V1 segment. Within this limitation, the vertebral arteries are codominant and patent within the neck. Atherosclerotic plaque within the right vertebral artery at the V3/V4 junction with no more than mild stenosis. Nonstenotic atherosclerotic plaque within the left vertebral artery at the V3/V4 junction. Skeleton: Prior ACDF at the C3-C7 levels (with intervening corpectomy cage). Chronic mildly displaced type 2 odontoid fracture, unchanged. As before, the dens fracture fragment is fused to the C1 anterior arch. Other neck: Heterogeneous and mildly enlarged thyroid  gland. Upper chest: No consolidation within the imaged lung apices. Review of the MIP images confirms the above findings CTA HEAD FINDINGS Anterior circulation: The intracranial internal carotid arteries are patent. Atherosclerotic plaque within both vessels with no more than mild stenosis. The M1 middle cerebral arteries are patent. No M2 proximal branch occlusion or high-grade proximal stenosis. The anterior cerebral arteries are patent. No intracranial aneurysm is identified. Posterior circulation: The intracranial vertebral arteries are patent. Atherosclerotic plaque within the right vertebral artery at the V3/V4 junction with no more than mild  stenosis. Nonstenotic atherosclerotic plaque within the left vertebral artery at the V3/V4 junction. The basilar artery is patent. The posterior cerebral arteries are patent. Posterior communicating arteries are present bilaterally. Venous sinuses: Within the limitations of contrast timing, no convincing thrombus. Anatomic variants: As described. Review of the MIP images confirms the above findings No emergent large vessel occlusion identified. These results were communicated to Dr. Cleone Dad At 6:25 pmon 5/20/2025by text page via the [facility] messaging system. IMPRESSION: Non-contrast head CT: 1. No acute intracranial finding. 2. 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 3. Generalized cerebral atrophy. 4. Paranasal sinus disease as described (most notably, severe right frontal and left maxillary sinusitis). CTA neck: 1. The common carotid and internal carotid arteries are patent within the neck without stenosis. Atherosclerotic plaque bilaterally, as described. 2. Streak/beam hardening artifact partially obscures the left vertebral artery proximal V1 segment. Within this limitation, the vertebral arteries are patent within the neck. A sclerotic  plaque within the bilateral vertebral arteries at the V3/V4 junction (no more than mild stenosis on the right, non-stenotic on the left). 3. Aortic Atherosclerosis (ICD10-I70.0). 4. Heterogeneous and mildly enlarged thyroid  gland. Given the patient's age, a non-emergent thyroid  ultrasound may be obtained for further evaluation as clinically appropriate. Reference: J Am Coll Radiol. 2015 Feb;12(2): 143-50. CTA head: Intracranial atherosclerotic disease as described. No proximal intracranial large vessel occlusion or high-grade proximal arterial stenosis identified. Electronically Signed   By: Bascom Lily D.O.   On: 04/15/2024 18:26   DG Chest Portable 1 View Result  Date: 04/15/2024 CLINICAL DATA:  Chest pain EXAM: PORTABLE CHEST 1 VIEW COMPARISON:  04/01/2024 FINDINGS: Partially visualized right shoulder replacement. Left-sided pacing device and valve prosthesis as before. No acute airspace disease or pleural effusion. Dense mitral calcification. Normal cardiomediastinal silhouette with aortic atherosclerosis. No pneumothorax. Hardware in the cervical spine. Multiple right-sided rib fractures as noted previously. IMPRESSION: No active disease. Electronically Signed   By: Esmeralda Hedge M.D.   On: 04/15/2024 16:54    Procedures .Critical Care  Performed by: Sallyanne Creamer, DO Authorized by: Sallyanne Creamer, DO   Critical care provider statement:    Critical care time (minutes):  40   Critical care was necessary to treat or prevent imminent or life-threatening deterioration of the following conditions:  CNS failure or compromise   Critical care was time spent personally by me on the following activities:  Development of treatment plan with patient or surrogate, discussions with consultants, evaluation of patient's response to treatment, examination of patient, ordering and review of laboratory studies, ordering and review of radiographic studies, ordering and performing treatments and interventions, pulse oximetry, re-evaluation of patient's condition and review of old charts   I assumed direction of critical care for this patient from another provider in my specialty: no     Care discussed with: admitting provider   Comments:     Discussed with admitting provider and neurology team     Medications Ordered in ED Medications  aztreonam  (AZACTAM ) 2 g in sodium chloride  0.9 % 100 mL IVPB (has no administration in time range)  iohexol  (OMNIPAQUE ) 350 MG/ML injection 120 mL (120 mLs Intravenous Contrast Given 04/15/24 1751)    ED Course/ Medical Decision Making/ A&P Clinical Course as of 04/15/24 1936  Tue Apr 15, 2024  1724 Laboratory workup notable for  elevation in D-dimer greater than 2.  Will need CTA PE study to evaluate for PE.  High sensitive a troponin of 14.  Not consistent with ACS.  Elevated blood glucose in the setting of diabetes.  BNP close to baseline and chest x-ray shows no active disease.  No other severe abnormalities on laboratory workup [MP]  1736 I was called by nursing staff to patient's bedside given concern for acute mental status changes.  Upon my assessment she is staring blankly.  Asymmetric pupils now.  Able to follow commands.  Concern for acute stroke.  Will activate as code stroke and obtain CTA a head and neck and alert neurology team [MP]  1840 No acute findings CTA head and neck.  Aphasia and right word gaze are improving.  Neurology team voices concern for potential focal seizure.  They have ordered MRI brain [MP]  1931 CTA chest shows concern for developing right lower lobe aspiration pneumonia.  No evidence of pulmonary embolism.  Will admit to medicine for further management of pneumonia.  MRI brain for full stroke workup pending. [MP]    Clinical  Course User Index [MP] Sallyanne Creamer, DO                                 Medical Decision Making 83 year old female with history as above including recent admission for aspiration pneumonia presenting for shortness of breath chest tightness.  Recently discharged from rehab facility following this admission.  Was on 2 L of O2 nasal cannula and they are trying to titrate off oxygen.  Titrated down to 1 L nasal cannula which caused some shortness of breath and chest tightness.  Satting well on 2 L here breathing comfortably.  No active chest pain.  Differential diagnose includes ACS, recurrent pneumonia, viral respiratory illness, pulmonary embolism, anemia or aortic stenosis status post TAVR.  Will obtain limited workup EKG chest x-ray.  Will also obtain high-sensitivity opponent and D-dimer.  If D-dimer significantly elevated will obtain CTA chest to evaluate for  PE  Amount and/or Complexity of Data Reviewed Labs: ordered. Radiology: ordered.  Risk Prescription drug management.           Final Clinical Impression(s) / ED Diagnoses Final diagnoses:  Pneumonia of right lower lobe due to infectious organism  Aphasia    Rx / DC Orders ED Discharge Orders     None         Sallyanne Creamer, DO 04/15/24 1936

## 2024-04-15 NOTE — H&P (Signed)
 History and Physical  Danielle Clarke NGE:[id number] DOB: 1941/03/13 DOA: 04/15/2024  Referring physician: Dr. Antoniette Batty, EDP. PCP: Elester Grim, MD  Outpatient Specialists: Neurology. Patient coming from: Home, discharged from rehab 2 days ago.  Chief Complaint: Chest pain and shortness of breath  HPI: Danielle Clarke is a 83 y.o. female with medical history significant for type 1 diabetes, hyperlipidemia, aortic stenosis status post TAVR in 2024, dysphagia (recently started on dysphagia 1 diet), recently admitted for syncope (04/01/2024-04/04/2024) when she was found to have T12 compression fracture, dysphagia (underwent a barium swallow 04/06/2024-showed mild esophageal dysmotility), and acute hypoxic respiratory failure secondary to aspiration pneumonia, was weaned off oxygen supplementation, who presents to the ER via EMS from home after being discharged from SNF rehab 2 days ago with complaints of chest pain and shortness of breath.  Her family became concerned and wanted her evaluated in the ER.  In the ER, the patient became aphasic with blank staring.  Code stroke was called.  Per neurology's assessment there was suspicion for possible focal seizure.  An MRI brain and EEG were ordered and are pending.  Neurology will continue to see in consultation.  Additionally, she had a CT angio chest PE done which was negative for pulmonary embolism.  It showed bilateral lower lobe tree-in-bud nodules, right greater than left, subpleural right lower lobe consolidation likely aspiration with developing right lower lobe pneumonia.  The patient was started on empiric IV antibiotics.  Admitted by [facility], hospitalist service.  At the time of this visit the patient is alert, verbal, and interactive.  Her daughter and son are at bedside. .  ED Course: Temperature 37.8.  BP 151/45, pulse 75, respiration rate 18, O2 saturation 100% on room air.  Review of Systems: Review of systems as noted in the HPI. All  other systems reviewed and are negative.   Past Medical History:  Diagnosis Date   Aortic stenosis, mild    Arthritis    Asthmatic bronchitis    with colds per patient   Carotid artery disease (HCC)    40-59% bilateral ICA stenosis   Cataract    Cutaneous abscess of right foot    Glaucoma    Heart murmur    Dr Felipe Horton is her  cardiologist.    Hyperlipemia    Hypertension    Dr. 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 Lorie Rook and Dr. Honey Lusty   Syncope    Type 1 diabetes mellitus [facility])    Past Surgical History:  Procedure Laterality Date   ANKLE FRACTURE SURGERY Left 2001   steel plate and 3 screws    ANTERIOR CERVICAL CORPECTOMY N/A 07/31/2014   Procedure: Cervical Four to Cervical Six Corpectomy;  Surgeon: Adelbert Adler, MD;  Location: [facility] NEURO ORS;  Service: Neurosurgery;  Laterality: N/A;  C4 to C6 Corpectomy   BREAST SURGERY  1988   CATARACT EXTRACTION W/ INTRAOCULAR LENS  IMPLANT, BILATERAL Bilateral 2011   right and then left   EYE SURGERY     FRACTURE SURGERY     HAMMER TOE SURGERY  1998   I & D EXTREMITY Right 09/27/2018   Procedure: IRRIGATION AND DEBRIDEMENT RIGHT FOOT;  Surgeon: Timothy Ford, MD;  Location: [facility] OR;  Service: Orthopedics;  Laterality: Right;   INTRAOPERATIVE TRANSTHORACIC ECHOCARDIOGRAM N/A 02/06/2023  Procedure: INTRAOPERATIVE TRANSTHORACIC ECHOCARDIOGRAM;  Surgeon: Thukkani, Arun K, MD;  Location: [facility] OR;  Service: Open Heart Surgery;  Laterality: N/A;   JOINT REPLACEMENT     PACEMAKER IMPLANT N/A 02/10/2019   Procedure: PACEMAKER IMPLANT;  Surgeon: Tammie Fall, MD;  Location: [facility] INVASIVE CV LAB;  Service: Cardiovascular;  Laterality: N/A;   RIGHT/LEFT HEART CATH AND CORONARY ANGIOGRAPHY N/A 01/11/2023   Procedure: RIGHT/LEFT HEART CATH AND CORONARY ANGIOGRAPHY;  Surgeon: Kyra Phy, MD;  Location: [facility]  INVASIVE CV LAB;  Service: Cardiovascular;  Laterality: N/A;   TONSILLECTOMY AND ADENOIDECTOMY  1948   TOTAL SHOULDER REPLACEMENT  2010   right shoulder    TRANSCATHETER AORTIC VALVE REPLACEMENT, TRANSFEMORAL N/A 02/06/2023   Procedure: Transcatheter Aortic Valve Replacement, Transfemoral;  Surgeon: Thukkani, Arun K, MD;  Location: [facility] OR;  Service: Open Heart Surgery;  Laterality: N/A;   TUBAL LIGATION  1979   TYMPANOSTOMY TUBE PLACEMENT Bilateral     Social History:  reports that she has never smoked. She has never used smokeless tobacco. She reports current alcohol use of about 2.0 standard drinks of alcohol per week. She reports that she does not use drugs.   Allergies  Allergen Reactions   Augmentin [Amoxicillin-Pot Clavulanate] Anaphylaxis   Ceclor [Cefaclor] Anaphylaxis    Tolerated cephalexin in 2018   Firvanq [Vancomycin] Anaphylaxis   Sumycin [Tetracycline] Anaphylaxis   Accupril  [Quinapril  Hcl] Other (See Comments)    Hyperkalemia   Bactrim  [Sulfamethoxazole -Trimethoprim ] Other (See Comments)    CKD - decreased kidney function   Fosamax [Alendronate Sodium] Other (See Comments)    Unknown reaction   Nsaids Other (See Comments)    CKD   Deltasone [Prednisone] Other (See Comments)    Unknown reaction    Family History  Problem Relation Age of Onset   Cancer Mother    Heart Problems Father       Prior to Admission medications   Medication Sig Start Date End Date Taking? Authorizing Provider  Amino Acids -Protein Hydrolys (PRO-STAT AWC) LIQD Take 30 mLs by mouth daily.   Yes [provider]  aspirin  EC 81 MG tablet Take 1 tablet (81 mg total) by mouth daily. Swallow whole. 12/14/22  Yes Thukkani, Arun K, MD  dextrose  (GLUTOSE) 40 % GEL Take 15 g by mouth See admin instructions. Give 15g by mouth every hour as needed for hypoglycemia - recheck blood sugar in 30 minutes.   Yes [provider]  Insulin  Glargine (LANTUS ) 100 UNIT/ML Solostar Pen Inject 13  Units into the skin daily. Patient taking differently: Inject 16 Units into the skin daily. 02/28/19  Yes Angiulli, Everlyn Hockey, PA-C  insulin  lispro (HUMALOG) 100 UNIT/ML injection Inject 0-12 Units into the skin See admin instructions. Inject 0-12 units subcutaneously before meals and at bedtime per sliding scale: < 70 : contact provider 70-200 : 0 units 201-250 : 2 units 251-300 : 4 units 301-350 : 6 units 351-400 : 8 units 401-450 : 10 units 451-600 : 12 units - recheck blood sugar in 2 hours, if BS > 350 or < 100, notify provider.   Yes [provider]  lidocaine  (LIDODERM ) 5 % Place 1 patch onto the skin daily as needed (lower back pain).   Yes [provider]  megestrol  (MEGACE ) 40 MG tablet Take 20 mg by mouth 2 (two) times daily.   Yes [provider]  methocarbamol (ROBAXIN) 750 MG tablet Take 750 mg by mouth 4 (four) times daily.   Yes [provider]  metoprolol  tartrate (LOPRESSOR ) 25 MG tablet Take 1 tablet (25 mg total) by mouth as directed for 1 dose. 02/16/24  Yes Ardia Kraft, PA-C  simvastatin  (ZOCOR ) 40 MG tablet Take 40 mg by mouth at bedtime. 03/20/20  Yes [provider]  traMADol  (ULTRAM ) 50 MG tablet Take 1 tablet (50 mg total) by mouth every 6 (six) hours as needed. 04/04/24  Yes Gherghe, Costin M, MD  clindamycin  (CLEOCIN ) 300 MG capsule Take 300 mg by mouth 3 (three) times daily. Patient not taking: Reported on 04/15/2024    [provider]    Physical Exam: BP (!) 125/41   Pulse 69   Temp 97.8 F (36.6 C) (Oral)   Resp 16   Ht 5' (1.524 m)   Wt 39.5 kg   SpO2 100%   BMI 17.01 kg/m   General: 83 y.o. year-old female well developed well nourished in no acute distress.  Alert and oriented x3. Cardiovascular: Regular rate and rhythm with no rubs or gallops.  No thyromegaly or JVD noted.  No lower extremity edema. 2/4 pulses in all 4 extremities. Respiratory: Clear to auscultation with no wheezes or rales.  Good inspiratory effort. Abdomen: Soft nontender nondistended with normal bowel sounds x4 quadrants. Muskuloskeletal: No cyanosis, clubbing or edema noted bilaterally Neuro: CN II-XII intact, strength, sensation, reflexes Skin: No ulcerative lesions noted or rashes Psychiatry: Judgement and insight appear normal. Mood is appropriate for condition and setting          Labs on Admission:  Basic Metabolic Panel: Recent Labs  Lab 04/15/24 1526 04/15/24 1804  NA 132* 128*  K 4.0 4.0  CL 99 91*  CO2 26  --   GLUCOSE 234* 179*  BUN 26* 28*  CREATININE 0.79 0.80  CALCIUM 7.8*  --   MG 1.9  --    Liver Function Tests: Recent Labs  Lab 04/15/24 1526  AST 19  ALT 23  ALKPHOS 66  BILITOT 0.4  PROT 5.6*  ALBUMIN 2.1*   No results for input(s): "LIPASE", "AMYLASE" in the last 168 hours. No results for input(s): "AMMONIA" in the last 168 hours. CBC: Recent Labs  Lab 04/15/24 1526 04/15/24 1804  WBC 10.0  --   NEUTROABS 7.8*  --   HGB 9.7* 8.8*  HCT 29.4* 26.0*  MCV 86.5  --   PLT 256  --    Cardiac Enzymes: No results for input(s): "CKTOTAL", "CKMB", "CKMBINDEX", "TROPONINI" in the last 168 hours.  BNP (last 3 results) Recent Labs    04/15/24 1526  BNP 264.9*    ProBNP (last 3 results) No results for input(s): "PROBNP" in the last 8760 hours.  CBG: Recent Labs  Lab 04/15/24 1726  GLUCAP 176*    Radiological Exams on Admission: CT Angio Chest PE W and/or Wo Contrast Result Date: 04/15/2024 CLINICAL DATA:  Chest pain and shortness of breath EXAM: CT ANGIOGRAPHY CHEST WITH CONTRAST TECHNIQUE: Multidetector CT imaging of the chest was performed using the standard protocol during bolus administration of intravenous contrast. Multiplanar CT image reconstructions and MIPs were obtained to evaluate the vascular anatomy. RADIATION DOSE REDUCTION: This exam was performed according to the departmental dose-optimization program which includes automated exposure control,  adjustment of the mA and/or kV according to patient size and/or use of iterative reconstruction technique. CONTRAST:  OMNIPAQUE  IOHEXOL  350 MG/ML SOLN COMPARISON:  Same day chest radiograph, CTA chest dated 01/18/2023 FINDINGS: Cardiovascular: Left anterior chest wall pacemaker leads terminate in the right atrium  and right ventricle. Status post aortic valve replacement. Extensive mitral annular calcifications. The study is high quality for the evaluation of pulmonary embolism. There are no filling defects in the central, lobar, segmental or subsegmental pulmonary artery branches to suggest acute pulmonary embolism. Great vessels are normal in course and caliber. Normal heart size. No significant pericardial fluid/thickening. Coronary artery calcifications and aortic atherosclerosis. Mediastinum/Nodes: Heterogeneous thyroid  gland. Mildly patulous esophagus. No pathologically enlarged axillary, supraclavicular, mediastinal, or hilar lymph nodes. Lungs/Pleura: The central airways are patent. Mild diffuse bronchial wall thickening. Bilateral lower lobe tree-in-bud nodules, right-greater-than-left. Subpleural right lower lobe consolidation. No pneumothorax. No pleural effusion. Upper abdomen: Normal. Musculoskeletal: No acute or abnormal lytic or blastic osseous lesions. Multilevel degenerative changes of the thoracic spine. Partially imaged right shoulder arthroplasty. 1.7 x 0.9 cm ovoid density within the right breast (20:97). Partially imaged cervical spinal fixation hardware appears intact. Review of the MIP images confirms the above findings. IMPRESSION: 1. No evidence of pulmonary embolism. 2. Bilateral lower lobe tree-in-bud nodules, right-greater-than-left, and subpleural right lower lobe consolidation, likely aspiration with developing right lower lobe pneumonia. 3. A 1.7 x 0.9 cm ovoid density within the right breast. Recommend correlation with dedicated breast imaging, as clinically indicated. Aortic  Atherosclerosis (ICD10-I70.0). Coronary artery calcifications. Assessment for potential risk factor modification, dietary therapy or pharmacologic therapy may be warranted, if clinically indicated. Electronically Signed   By: Limin  Xu M.D.   On: 04/15/2024 19:22   CT ANGIO HEAD NECK W WO CM (CODE STROKE) Result Date: 04/15/2024 CLINICAL DATA:  Provided history: Neuro deficit, acute, stroke suspected. EXAM: CT ANGIOGRAPHY HEAD AND NECK WITH AND WITHOUT CONTRAST TECHNIQUE: Multidetector CT imaging of the head and neck was performed using the standard protocol during bolus administration of intravenous contrast. Multiplanar CT image reconstructions and MIPs were obtained to evaluate the vascular anatomy. Carotid stenosis measurements (when applicable) are obtained utilizing NASCET criteria, using the distal internal carotid diameter as the denominator. RADIATION DOSE REDUCTION: This exam was performed according to the departmental dose-optimization program which includes automated exposure control, adjustment of the mA and/or kV according to patient size and/or use of iterative reconstruction technique. CONTRAST:  OMNIPAQUE  IOHEXOL  350 MG/ML SOLN COMPARISON:  Non-contrast head CT and CT angiogram head/neck 02/10/2024. FINDINGS: CT HEAD FINDINGS Brain: Generalized cerebral atrophy. 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 There is no acute intracranial hemorrhage. No acute demarcated cortical infarct. No extra-axial fluid collection. No evidence of an intracranial mass. No midline shift. Vascular: No hyperdense vessel.  Atherosclerotic calcifications. Skull: No calvarial fracture or aggressive osseous lesion. Sinuses/Orbits: Severe right frontal sinusitis (with complete sinus opacification). Mild bilateral ethmoid sinusitis. Severe left maxillary sinusitis (with associated chronic reactive osteitis).  No acute intracranial finding. These results were communicated to Dr. Cleone Dad At 6:01 pmon 5/20/2025by text page via the [facility] messaging system. Review of the MIP images confirms the above findings CTA NECK FINDINGS Aortic arch: Standard aortic branching (correlating with the prior CTA of 02/10/2024). The innominate artery origin is excluded from the field of view on the current study. The visualized thoracic aorta is normal in caliber. Atherosclerotic plaque within the visualized aortic arch and proximal major branch vessels of the neck. Streak/beam hardening artifact arising from a dense contrast bolus partially obscures the right subclavian artery. Within described limitations, no hemodynamically significant innominate or proximal subclavian artery stenosis is identified. Right carotid system: CCA and ICA patent within the neck without measurable  stenosis. Atherosclerotic plaque about the carotid bifurcation, within the proximal ICA and within the proximal ECA. Left carotid system: CCA and ICA patent within the neck without measurable stenosis. Atherosclerotic plaque about the carotid bifurcation, within the proximal ICA and within the proximal ECA. Vertebral arteries: Streak/beam hardening artifact arising from a right shoulder prosthesis partially obscures the left vertebral artery proximal V1 segment. Within this limitation, the vertebral arteries are codominant and patent within the neck. Atherosclerotic plaque within the right vertebral artery at the V3/V4 junction with no more than mild stenosis. Nonstenotic atherosclerotic plaque within the left vertebral artery at the V3/V4 junction. Skeleton: Prior ACDF at the C3-C7 levels (with intervening corpectomy cage). Chronic mildly displaced type 2 odontoid fracture, unchanged. As before, the dens fracture fragment is fused to the C1 anterior arch. Other neck: Heterogeneous and mildly enlarged thyroid  gland. Upper chest: No consolidation within the imaged lung  apices. Review of the MIP images confirms the above findings CTA HEAD FINDINGS Anterior circulation: The intracranial internal carotid arteries are patent. Atherosclerotic plaque within both vessels with no more than mild stenosis. The M1 middle cerebral arteries are patent. No M2 proximal branch occlusion or high-grade proximal stenosis. The anterior cerebral arteries are patent. No intracranial aneurysm is identified. Posterior circulation: The intracranial vertebral arteries are patent. Atherosclerotic plaque within the right vertebral artery at the V3/V4 junction with no more than mild stenosis. Nonstenotic atherosclerotic plaque within the left vertebral artery at the V3/V4 junction. The basilar artery is patent. The posterior cerebral arteries are patent. Posterior communicating arteries are present bilaterally. Venous sinuses: Within the limitations of contrast timing, no convincing thrombus. Anatomic variants: As described. Review of the MIP images confirms the above findings No emergent large vessel occlusion identified. These results were communicated to Dr. Cleone Dad At 6:25 pmon 5/20/2025by text page via the [facility] messaging system. IMPRESSION: Non-contrast head CT: 1. No acute intracranial finding. 2. 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 3. Generalized cerebral atrophy. 4. Paranasal sinus disease as described (most notably, severe right frontal and left maxillary sinusitis). CTA neck: 1. The common carotid and internal carotid arteries are patent within the neck without stenosis. Atherosclerotic plaque bilaterally, as described. 2. Streak/beam hardening artifact partially obscures the left vertebral artery proximal V1 segment. Within this limitation, the vertebral arteries are patent within the neck. A sclerotic plaque within the bilateral vertebral arteries at the V3/V4 junction (no more than  mild stenosis on the right, non-stenotic on the left). 3. Aortic Atherosclerosis (ICD10-I70.0). 4. Heterogeneous and mildly enlarged thyroid  gland. Given the patient's age, a non-emergent thyroid  ultrasound may be obtained for further evaluation as clinically appropriate. Reference: J Am Coll Radiol. 2015 Feb;12(2): 143-50. CTA head: Intracranial atherosclerotic disease as described. No proximal intracranial large vessel occlusion or high-grade proximal arterial stenosis identified. Electronically Signed   By: Bascom Lily D.O.   On: 04/15/2024 18:26   DG Chest Portable 1 View Result Date: 04/15/2024 CLINICAL DATA:  Chest pain EXAM: PORTABLE CHEST 1 VIEW COMPARISON:  04/01/2024 FINDINGS: Partially visualized right shoulder replacement. Left-sided pacing device and valve prosthesis as before. No acute airspace disease or pleural effusion. Dense mitral calcification. Normal cardiomediastinal silhouette with aortic atherosclerosis. No pneumothorax. Hardware in the cervical spine. Multiple right-sided rib fractures as noted previously. IMPRESSION: No active disease. Electronically Signed   By: Esmeralda Hedge M.D.   On: 04/15/2024 16:54    EKG: I independently viewed the EKG done and  my findings are as followed: Sinus rhythm rate of 79.  Nonspecific ST-T changes.  QTc 501.  Assessment/Plan Present on Admission:  Pneumonia of right lower lobe due to infectious organism  Principal Problem:   Pneumonia of right lower lobe due to infectious organism  Right lower lobe pneumonia, POA Aspiration suspected Resume home dysphagia 1 diet Speech therapist consulted Aspiration precautions Continue empiric IV antibiotics, aztreonam , due to multiple antibiotics allergies  Chest tightness, likely noncardiac Resolved High sensitive troponin negative x 2 No evidence of acute ischemia on twelve-lead EKG Monitor on telemetry  Code stroke Aphasia, blank staring, resolved Follow-up brain MRI Neurology  following Monitor on telemetry  Type 1 diabetes with hyperglycemia Basal insulin  and short acting insulin  Heart healthy carb modified diet  Dysphagia On dysphagia 1 diet Speech therapist consulted Aspiration precautions  Hyperlipidemia Resume home simvastatin   Chronic HFpEF Last 2D echo done on 02/15/2024 revealed LVEF 50 to 55% with grade 1 diastolic dysfunction Monitor strict I's and O's and daily weight   Incidental findings seen on CT scan: 0.7 x 0.9 cm ovoid density within the right breast.  Recommend correlation with dedicated breast imaging as clinically indicated.  Heterogeneous and mildly enlarged thyroid  gland.  Follow thyroid  ultrasound and TSH   Critical care time: 55 minutes.   DVT prophylaxis: Subcu Lovenox  daily  Code Status: Full code  Family Communication: None at bedside.  Disposition Plan: Admitted to telemetry medical unit.  Consults called: Neurology.  Admission status: Inpatient status.   Status is: Inpatient The patient requires at least 2 midnights for further evaluation and treatment of present condition.   Bary Boss MD [facility] Pager [phone]  If 7PM-7AM, please contact night-coverage [email or web address] Password [facility]  04/15/2024, 7:49 PM

## 2024-04-15 NOTE — ED Notes (Signed)
 MD updated on patient's status change. Patient is nonverbal currently, not following RN commands to follow finger with eyes. Patient does blink bilaterally with flicking.

## 2024-04-15 NOTE — Code Documentation (Signed)
 Stroke Response Nurse Documentation Code Documentation  ILMA ACHEE is a 83 y.o. female arriving to [facility]  via [facility] on 04/15/2024 with past medical hx of TAVR, carotid artery disease, DM, CRF, HTN, HLD, 3rdHB s/p pacemaker, TBI.  On aspirin  81 mg daily. Code stroke was activated by ED.   Patient from ED room 35, where she was being evaluated for SOB.  She was LKW at 1725 and now complaining of unresponsiveness.   Patient met by Stroke Team in CT upon activation. Labs drawn and patient cleared for CT by Dr. Ranelle Buys. NIHSS 12, see documentation for details and code stroke times. Patient with not following commands, right facial droop, bilateral arm weakness, bilateral leg weakness, and dysarthria  on exam. The following imaging was completed:  CT Head and CTA. Patient is not a candidate for IV Thrombolytic due to stroke not suspected at this time. Patient is not a candidate for IR due to no LVO on imaging.   Care Plan:  q 30 NIHSS and VS until OOW at 21:55. Then q 2 NIHSS and BP x 12 hr, then q 4. NPO until Stroke swallow screen completed    Bedside handoff with ED RN Alisa App.    Liliana Dang Livengood  Stroke Response RN

## 2024-04-15 NOTE — ED Triage Notes (Signed)
 Patient arrives via [facility] from [facility] after recent hospitalization. Chest pain and shortness of breath described as tightness throughout today. On O2 since she arrived at nursing facility. Discharged from hospital with pneumonia. Right upper diminished-left upper clear. 99 on 3L. Denies current CP. No ASA or nitroglycerin . 12 lead paced rhythm.    20 RAC.   EMS vitals  104/52 HR 76 RR 16 CBG 239

## 2024-04-16 ENCOUNTER — Inpatient Hospital Stay (HOSPITAL_COMMUNITY)

## 2024-04-16 ENCOUNTER — Encounter (HOSPITAL_COMMUNITY): Payer: Self-pay | Admitting: Internal Medicine

## 2024-04-16 DIAGNOSIS — I69828 Other speech and language deficits following other cerebrovascular disease: Secondary | ICD-10-CM | POA: Diagnosis not present

## 2024-04-16 DIAGNOSIS — R561 Post traumatic seizures: Secondary | ICD-10-CM | POA: Diagnosis not present

## 2024-04-16 DIAGNOSIS — J189 Pneumonia, unspecified organism: Secondary | ICD-10-CM | POA: Diagnosis not present

## 2024-04-16 LAB — CBC
HCT: 34.5 % — ABNORMAL LOW (ref 36.0–46.0)
Hemoglobin: 11.1 g/dL — ABNORMAL LOW (ref 12.0–15.0)
MCH: 28.5 pg (ref 26.0–34.0)
MCHC: 32.2 g/dL (ref 30.0–36.0)
MCV: 88.5 fL (ref 80.0–100.0)
Platelets: 258 10*3/uL (ref 150–400)
RBC: 3.9 MIL/uL (ref 3.87–5.11)
RDW: 16.2 % — ABNORMAL HIGH (ref 11.5–15.5)
WBC: 8.2 10*3/uL (ref 4.0–10.5)
nRBC: 0 % (ref 0.0–0.2)

## 2024-04-16 LAB — BASIC METABOLIC PANEL WITH GFR
Anion gap: 9 (ref 5–15)
BUN: 19 mg/dL (ref 8–23)
CO2: 24 mmol/L (ref 22–32)
Calcium: 8.3 mg/dL — ABNORMAL LOW (ref 8.9–10.3)
Chloride: 99 mmol/L (ref 98–111)
Creatinine, Ser: 0.62 mg/dL (ref 0.44–1.00)
GFR, Estimated: >60 mL/min (ref >60)
Glucose, Bld: 226 mg/dL — ABNORMAL HIGH (ref 70–99)
Potassium: 4.7 mmol/L (ref 3.5–5.1)
Sodium: 132 mmol/L — ABNORMAL LOW (ref 135–145)

## 2024-04-16 LAB — GLUCOSE, CAPILLARY
Glucose-Capillary: 225 mg/dL — ABNORMAL HIGH (ref 70–99)
Glucose-Capillary: 173 mg/dL — ABNORMAL HIGH (ref 70–99)
Glucose-Capillary: 325 mg/dL — ABNORMAL HIGH (ref 70–99)
Glucose-Capillary: 428 mg/dL — ABNORMAL HIGH (ref 70–99)
Glucose-Capillary: 440 mg/dL — ABNORMAL HIGH (ref 70–99)

## 2024-04-16 LAB — MAGNESIUM: Magnesium: 2.1 mg/dL (ref 1.7–2.4)

## 2024-04-16 LAB — PHOSPHORUS: Phosphorus: 3.4 mg/dL (ref 2.5–4.6)

## 2024-04-16 LAB — TSH: TSH: 2.121 u[IU]/mL (ref 0.350–4.500)

## 2024-04-16 LAB — CBG MONITORING, ED: Glucose-Capillary: 183 mg/dL — ABNORMAL HIGH (ref 70–99)

## 2024-04-16 MED ORDER — LEVOFLOXACIN IN D5W 250 MG/50ML IV SOLN
250.0000 mg | INTRAVENOUS | Status: DC
  Administered 2024-04-16: 250 mg via INTRAVENOUS
  Filled 2024-04-16 (×2): qty 50

## 2024-04-16 MED ORDER — LEVETIRACETAM 250 MG PO TABS
250.0000 mg | ORAL_TABLET | Freq: Two times a day (BID) | ORAL | Status: DC
  Administered 2024-04-16 – 2024-04-25 (×17): 250 mg via ORAL
  Filled 2024-04-16 (×19): qty 1

## 2024-04-16 MED ORDER — AZTREONAM 2 G IJ SOLR: 2.0000 g | Freq: Two times a day (BID) | INTRAMUSCULAR | Status: DC

## 2024-04-16 MED ORDER — INSULIN ASPART 100 UNIT/ML IJ SOLN
0.0000 [IU] | Freq: Three times a day (TID) | INTRAMUSCULAR | Status: DC
  Administered 2024-04-16 – 2024-04-17 (×3): 9 [IU] via SUBCUTANEOUS
  Administered 2024-04-17: 2 [IU] via SUBCUTANEOUS
  Administered 2024-04-18: 9 [IU] via SUBCUTANEOUS
  Administered 2024-04-18: 1 [IU] via SUBCUTANEOUS
  Administered 2024-04-18 (×2): 3 [IU] via SUBCUTANEOUS
  Administered 2024-04-18: 7 [IU] via SUBCUTANEOUS
  Administered 2024-04-19: 1 [IU] via SUBCUTANEOUS
  Administered 2024-04-19 (×2): 2 [IU] via SUBCUTANEOUS
  Administered 2024-04-19: 1 [IU] via SUBCUTANEOUS
  Administered 2024-04-20: 5 [IU] via SUBCUTANEOUS
  Administered 2024-04-20: 3 [IU] via SUBCUTANEOUS
  Administered 2024-04-20: 5 [IU] via SUBCUTANEOUS
  Administered 2024-04-20: 7 [IU] via SUBCUTANEOUS
  Administered 2024-04-21: 9 [IU] via SUBCUTANEOUS
  Administered 2024-04-21: 3 [IU] via SUBCUTANEOUS
  Administered 2024-04-21: 7 [IU] via SUBCUTANEOUS
  Administered 2024-04-21: 5 [IU] via SUBCUTANEOUS
  Administered 2024-04-22: 9 [IU] via SUBCUTANEOUS

## 2024-04-16 NOTE — ED Notes (Signed)
Pt transported to MRI with RN.

## 2024-04-16 NOTE — Discharge Instructions (Signed)
 To control seizures, your medications have been adjusted as follows:  - Start Keppra 250 mg every 12 hours    Please keep track of any other spells you have, including shaking spells, loss of consciousness events, staring spells etc to review with your neurologist   For example   Spell log:  - Date and time of event: 5/20 while in [facility] ED - Description of event: Right gaze, behavioral arrest, - Prodome (any warning / premonition event is going to happen): None - Post-spell symptoms: Amnesia to event, gradually improving speech  - Any potential triggers: Aspiration pneumonia   Standard seizure precautions: Per [place]  DMV statutes, patients with seizures are not allowed to drive until  they have been seizure-free for six months. Use caution when using heavy equipment or power tools. Avoid working on ladders or at heights. Take showers instead of baths. Ensure the water  temperature is not too high on the home water  heater. Do not go swimming alone. When caring for infants or small children, sit down when holding, feeding, or changing them to minimize risk of injury to the child in the event you have a seizure.  To reduce risk of seizures, maintain good sleep hygiene avoid alcohol and illicit drug use, take all anti-seizure medications as prescribed.

## 2024-04-16 NOTE — Progress Notes (Signed)
 NEUROLOGY CONSULT FOLLOW UP NOTE   Date of service: Apr 16, 2024 Patient Name: LARRISSA STIVERS MRN:  [id number] DOB:  1941-07-13  Interval Hx/subjective   Awake, lying in bed.  More awake today.  States she is not having any trouble breathing at this time.  Denies headache. Perseverates on wanting to leave the hospital  Vitals   Vitals:   04/16/24 0521 04/16/24 0530 04/16/24 0545 04/16/24 0630  BP:  (!) 143/48 (!) 139/59 (!) 167/45  Pulse:    62  Resp:  14 13 14   Temp: 97.6 F (36.4 C)     TempSrc: Oral     SpO2:    100%  Weight:      Height:         Body mass index is 17.01 kg/m.  Physical Exam   Physical Exam  Constitutional: Appears well-developed and well-nourished.   Cardiovascular: Normal rate and regular rhythm.  Respiratory: Effort normal, non-labored breathing  Neuro: Mental Status: Patient is alert, eyes open .  She is oriented to person, place, month.  Initially states since 1925 and at the blood pressure clinic.  Easily reoriented. She is initially dysarthric and slow to answer questions.  On repeat exam she is able to repeat and identify objects correctly, though she is slow to respond and hypophonic.  Difficult to assess how much of her lack of participation in examination is volitional; she expresses a strong desire to leave the hospital  Cranial Nerves: II: Blinks to threat bilaterally, right pupil is 5 mm and nonreactive, left pupil is 3 mm and nonreactive (versus minimally/slightly reactive bilaterally) III,IV, VI: EOMI without ptosis or diploplia.  VII: Facial movement is symmetric, daughter notes left facial droop at baseline when she is sleeping VIII: Hearing is intact to voice X: Voice is hypophonic XI: Head is midline XII: Tongue protrudes midline without atrophy or fasciculations.  Sensory/motor: Tone is normal. Bulk is normal.  Moves all extremities antigravity When asked to hold arms in the air she states "I don't want to do that" but hand  grasp is equal  Cerebellar: No gross ataxia   Medications  Current Facility-Administered Medications:    acetaminophen  (TYLENOL ) tablet 650 mg, 650 mg, Oral, Q6H PRN, Hall, Carole N, DO   aspirin  EC tablet 81 mg, 81 mg, Oral, Daily, Hall, Carole N, DO   enoxaparin  (LOVENOX ) injection 30 mg, 30 mg, Subcutaneous, Q24H, Hall, Carole N, DO, 30 mg at 04/15/24 2230   feeding supplement (GLUCERNA SHAKE) (GLUCERNA SHAKE) liquid 237 mL, 237 mL, Oral, TID BM, Hall, Carole N, DO, 237 mL at 04/15/24 2200   insulin  aspart (novoLOG ) injection 0-5 Units, 0-5 Units, Subcutaneous, QHS, Hall, Carole N, DO   insulin  aspart (novoLOG ) injection 0-9 Units, 0-9 Units, Subcutaneous, TID WC, Hall, Carole N, DO   insulin  aspart (novoLOG ) injection 3 Units, 3 Units, Subcutaneous, TID WC, Hall, Carole N, DO   insulin  glargine-yfgn (SEMGLEE ) injection 5 Units, 5 Units, Subcutaneous, BID, Hall, Carole N, DO   Levofloxacin  (LEVAQUIN ) IVPB 250 mg, 250 mg, Intravenous, Q24H, Hall, Carole N, DO   melatonin tablet 5 mg, 5 mg, Oral, QHS PRN, Hall, Carole N, DO, 5 mg at 04/15/24 2120   polyethylene glycol (MIRALAX  / GLYCOLAX ) packet 17 g, 17 g, Oral, Daily PRN, Hall, Carole N, DO   prochlorperazine  (COMPAZINE ) injection 5 mg, 5 mg, Intravenous, Q6H PRN, Hall, Carole N, DO   simvastatin  (ZOCOR ) tablet 40 mg, 40 mg, Oral, QHS, Hall, Carole N, DO, 40 mg  at 04/15/24 2120  Current Outpatient Medications:    Amino Acids -Protein Hydrolys (PRO-STAT AWC) LIQD, Take 30 mLs by mouth daily., Disp: , Rfl:    aspirin  EC 81 MG tablet, Take 1 tablet (81 mg total) by mouth daily. Swallow whole., Disp: 30 tablet, Rfl: 12   dextrose  (GLUTOSE) 40 % GEL, Take 15 g by mouth See admin instructions. Give 15g by mouth every hour as needed for hypoglycemia - recheck blood sugar in 30 minutes., Disp: , Rfl:    Insulin  Glargine (LANTUS ) 100 UNIT/ML Solostar Pen, Inject 13 Units into the skin daily. (Patient taking differently: Inject 16 Units into the  skin daily.), Disp: 15 mL, Rfl: 11   insulin  lispro (HUMALOG) 100 UNIT/ML injection, Inject 0-12 Units into the skin See admin instructions. Inject 0-12 units subcutaneously before meals and at bedtime per sliding scale: < 70 : contact provider 70-200 : 0 units 201-250 : 2 units 251-300 : 4 units 301-350 : 6 units 351-400 : 8 units 401-450 : 10 units 451-600 : 12 units - recheck blood sugar in 2 hours, if BS > 350 or < 100, notify provider., Disp: , Rfl:    lidocaine  (LIDODERM ) 5 %, Place 1 patch onto the skin daily as needed (lower back pain)., Disp: , Rfl:    megestrol  (MEGACE ) 40 MG tablet, Take 20 mg by mouth 2 (two) times daily., Disp: , Rfl:    methocarbamol (ROBAXIN) 750 MG tablet, Take 750 mg by mouth 4 (four) times daily., Disp: , Rfl:    metoprolol  tartrate (LOPRESSOR ) 25 MG tablet, Take 1 tablet (25 mg total) by mouth as directed for 1 dose., Disp: 1 tablet, Rfl: 0   simvastatin  (ZOCOR ) 40 MG tablet, Take 40 mg by mouth at bedtime., Disp: , Rfl:    traMADol  (ULTRAM ) 50 MG tablet, Take 1 tablet (50 mg total) by mouth every 6 (six) hours as needed., Disp: 10 tablet, Rfl: 0   clindamycin  (CLEOCIN ) 300 MG capsule, Take 300 mg by mouth 3 (three) times daily. (Patient not taking: Reported on 04/15/2024), Disp: , Rfl:   Labs and Diagnostic Imaging   CBC:  Recent Labs  Lab 04/15/24 1526 04/15/24 1804 04/16/24 0647  WBC 10.0  --  8.2  NEUTROABS 7.8*  --   --   HGB 9.7* 8.8* 11.1*  HCT 29.4* 26.0* 34.5*  MCV 86.5  --  88.5  PLT 256  --  258    Basic Metabolic Panel:  Lab Results  Component Value Date   NA 132 (L) 04/16/2024   K 4.7 04/16/2024   CO2 24 04/16/2024   GLUCOSE 226 (H) 04/16/2024   BUN 19 04/16/2024   CREATININE 0.62 04/16/2024   CALCIUM 8.3 (L) 04/16/2024   GFRNONAA >60 04/16/2024   GFRAA >60 02/25/2019   Lipid Panel:  Lab Results  Component Value Date   LDLCALC 71 10/23/2023   HgbA1c:  Lab Results  Component Value Date   HGBA1C 9.7 (H) 04/01/2024   Urine  Drug Screen: No results found for: "LABOPIA", "COCAINSCRNUR", "LABBENZ", "AMPHETMU", "THCU", "LABBARB"  Alcohol Level     Component Value Date/Time   ETH <15 04/15/2024 1736   INR  Lab Results  Component Value Date   INR 1.4 (H) 04/15/2024   APTT  Lab Results  Component Value Date   APTT 32 04/15/2024   AED levels: No results found for: "PHENYTOIN", "ZONISAMIDE", "LAMOTRIGINE", "LEVETIRACETA"  Non-contrast head CT: 1. No acute intracranial finding. 2. 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 3. Generalized cerebral atrophy. 4. Paranasal sinus disease as described (most notably, severe right frontal and left maxillary sinusitis).   CTA neck: 1. The common carotid and internal carotid arteries are patent within the neck without stenosis. Atherosclerotic plaque bilaterally, as described. 2. Streak/beam hardening artifact partially obscures the left vertebral artery proximal V1 segment. Within this limitation, the vertebral arteries are patent within the neck. A sclerotic plaque within the bilateral vertebral arteries at the V3/V4 junction (no more than mild stenosis on the right, non-stenotic on the left).  3. Aortic Atherosclerosis (ICD10-I70.0). 4. Heterogeneous and mildly enlarged thyroid  gland. Given the patient's age, a non-emergent thyroid  ultrasound may be obtained for further evaluation as clinically appropriate. Reference: J Am Coll Radiol. 2015 Feb;12(2): 143-50.   CTA head: Intracranial atherosclerotic disease as described. No proximal intracranial large vessel occlusion or high-grade proximal arterial stenosis identified.   CTA Chest 1. No evidence of pulmonary embolism. 2. Bilateral lower lobe tree-in-bud nodules, right-greater-than-left, and subpleural right lower lobe consolidation, likely aspiration with developing right lower lobe pneumonia. 3. A 1.7 x 0.9  cm ovoid density within the right breast. Recommend correlation with dedicated breast imaging, as clinically indicated.  MRI Brain(Personally reviewed): No acute finding when compared to prior. Left frontal and bilateral anterior temporal encephalomalacia including cortex.  Generalized atrophy. Chronic sinusitis.  rEEG:  Declined by patient  Assessment   XIADANI DAMMAN is a 83 y.o. female who initially presented with shortness of breath and then had an acute mental status change while in the emergency department.  Given her history of IPH and symptoms there is concern for focal seizure activity.  She was rapidly returning to baseline, family does state that she is still not speaking as well as she typically does but they agree that her symptoms are nondisabling and therefore we did not pursue thrombolytic.  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 Patient is declining having EEG completed and this will not change my management so will defer to outpatient at this time.   Recommendations  - MRI negative - EEG declined by patient - Keppra 250 mg every 12 hours based on CrCl    Estimated Creatinine Clearance: 34.4 mL/min (by C-G formula based on SCr of 0.62 mg/dL).  - Outpatient neurology follow-up placed  - Seizure precautions and instructions placed in discharge instructions, please review with patient/family prior to discharge - Family confirmed patient does not drive ______________________________________________________________________   Signed, Imogene Mana, NP [facility]   On attending exam, agree, speech is much improved, able to name and  repeat, able to use her RUE well for finger to nose with encouragement, limited participation as she is eager to leave the hospital and she is declining further testing at this time  Attending Neurologist's note:  I personally saw this patient, gathering history, performing a full neurologic examination, reviewing relevant labs, personally reviewing relevant imaging including MRI brain, and formulated the assessment and plan, adding the note above for completeness and clarity to accurately reflect my thoughts  Baldwin Levee MD-PhD [facility] [phone] Available 7 AM to 7 PM, outside these hours please contact Neurologist on call listed on AMION

## 2024-04-16 NOTE — Plan of Care (Signed)

## 2024-04-16 NOTE — Evaluation (Signed)
 Physical Therapy Evaluation Patient Details Name: Danielle Clarke MRN: [id number] DOB: January 25, 1941 Today's Date: 04/16/2024  History of Present Illness  Pt is 83 yo female who presents 04/15/24 who presents to the ER via EMS from home after being discharged from SNF rehab 2 days ago with c/o chest pain and SOB. Pt recently admitted  04/01/24-04/04/24 for syncope, T12 compression fx, and dysphagia. In ED pt noted to become aphasic and blank stare, code stroke called. Chest CT negative for PE. MRI of brain pending. PMH significant for dysphagia, CAD, TAVR, bradycardia with PPM placement, HTN, HLD, DM1, ICH.   Clinical Impression  ARIYEL JEANGILLES is 83 y.o. female admitted with above HPI and diagnosis. Patient is currently limited by functional impairments below (see PT problem list). Patient lives with daughter and reports use of RW for mobility at baseline. Pt does not elaborate on mobility level at SNF or since return home from SNF. No family present to provide PLOF/CLOF. Eval limited as pt agreeable to bed level testing only and easily irritated by questions and encouragement to mobilize. Patient will benefit from continued skilled PT interventions to address impairments and progress independence with mobility. Patient will benefit from continued inpatient follow up therapy, <3 hours/day. Acute PT will follow and progress as able.         If plan is discharge home, recommend the following: Two people to help with walking and/or transfers;A lot of help with bathing/dressing/bathroom;Assistance with feeding;Help with stairs or ramp for entrance;Assist for transportation   Can travel by private vehicle   No    Equipment Recommendations None recommended by PT  Recommendations for Other Services       Functional Status Assessment Patient has had a recent decline in their functional status and demonstrates the ability to make significant improvements in function in a reasonable and predictable  amount of time.     Precautions / Restrictions Precautions Precautions: Fall;Other (comment) Recall of Precautions/Restrictions: Impaired Precaution/Restrictions Comments: back, has TLSO brace Restrictions Weight Bearing Restrictions Per Provider Order: No      Mobility  Bed Mobility Overal bed mobility: Needs Assistance             General bed mobility comments: declining bed mobility or EOB activity, denies knowledge of TLSO for OOB. anticipate +2 assist for safety.    Transfers                        Ambulation/Gait                  Stairs            Wheelchair Mobility     Tilt Bed    Modified Rankin (Stroke Patients Only)       Balance Overall balance assessment: History of Falls                                           Pertinent Vitals/Pain Pain Assessment Pain Assessment: Faces Faces Pain Scale: Hurts a little bit Pain Location: generalized with mobility Pain Descriptors / Indicators: Discomfort (irritable) Pain Intervention(s): Limited activity within patient's tolerance, Monitored during session    Home Living Family/patient expects to be discharged to:: Private residence Living Arrangements: Children Available Help at Discharge: Family;Personal care attendant Type of Home: House Home Access: Stairs to enter Entrance Stairs-Rails: Left Entrance Stairs-Number of Steps: 2  Home Layout: One level Home Equipment: Shower seat;Grab bars - tub/shower;Rolling Walker (2 wheels);Cane - single point;Wheelchair - manual;BSC/3in1 Additional Comments: daughter lives next door, is a Financial controller so at home intermittently. Pt takes care of her cat    Prior Function Prior Level of Function : Needs assist                     Extremity/Trunk Assessment   Upper Extremity Assessment Upper Extremity Assessment: Defer to OT evaluation;Generalized weakness    Lower Extremity Assessment Lower Extremity  Assessment: Generalized weakness LLE Deficits / Details: h/o L ankle sx with ~2" LLD, has a heel lift. pt grossly 3/5 or less for LE strength with supine MMT.    Cervical / Trunk Assessment Cervical / Trunk Assessment: Other exceptions Cervical / Trunk Exceptions: scoliosis  Communication   Communication Communication: No apparent difficulties    Cognition Arousal: Alert Behavior During Therapy: Agitated, Flat affect   PT - Cognitive impairments: No family/caregiver present to determine baseline                       PT - Cognition Comments: pt able to follow conversation, irritated with questions and limited willingness to participate. pt wanting to watch TV. Following commands: Intact       Cueing Cueing Techniques: Verbal cues     General Comments      Exercises     Assessment/Plan    PT Assessment Patient needs continued PT services  PT Problem List Decreased strength;Decreased range of motion;Decreased activity tolerance;Decreased balance;Decreased mobility;Decreased coordination;Decreased cognition;Decreased knowledge of use of DME;Decreased safety awareness;Decreased knowledge of precautions;Pain;Decreased skin integrity;Cardiopulmonary status limiting activity       PT Treatment Interventions DME instruction;Gait training;Functional mobility training;Therapeutic activities;Therapeutic exercise;Balance training;Cognitive remediation;Neuromuscular re-education;Patient/family education    PT Goals (Current goals can be found in the Care Plan section)  Acute Rehab PT Goals Patient Stated Goal: get better and return home to cat PT Goal Formulation: With patient/family Time For Goal Achievement: 04/30/24 Potential to Achieve Goals: Fair    Frequency Min 2X/week     Co-evaluation               AM-PAC PT "6 Clicks" Mobility  Outcome Measure Help needed turning from your back to your side while in a flat bed without using bedrails?: Total Help  needed moving from lying on your back to sitting on the side of a flat bed without using bedrails?: Total Help needed moving to and from a bed to a chair (including a wheelchair)?: Total Help needed standing up from a chair using your arms (e.g., wheelchair or bedside chair)?: Total Help needed to walk in hospital room?: Total Help needed climbing 3-5 steps with a railing? : Total 6 Click Score: 6    End of Session Equipment Utilized During Treatment: Oxygen Activity Tolerance: Other (comment) (self limiting) Patient left: in bed;with call bell/phone within reach Nurse Communication: Mobility status PT Visit Diagnosis: Muscle weakness (generalized) (M62.81);Difficulty in walking, not elsewhere classified (R26.2);Pain    Time: 1610-9604 PT Time Calculation (min) (ACUTE ONLY): 11 min   Charges:   PT Evaluation $PT Eval Moderate Complexity: 1 Mod   PT General Charges $$ ACUTE PT VISIT: 1 Visit         Tish Forge, DPT Acute Rehabilitation Services Office [phone]  04/16/24 11:06 AM

## 2024-04-16 NOTE — ED Notes (Signed)
Glucose 183

## 2024-04-16 NOTE — Evaluation (Signed)
 Clinical/Bedside Swallow Evaluation Patient Details  Name: Danielle Clarke MRN: [id number] Date of Birth: 10/23/1941  Today's Date: 04/16/2024 Time: SLP Start Time (ACUTE ONLY): 0859 SLP Stop Time (ACUTE ONLY): 0906 SLP Time Calculation (min) (ACUTE ONLY): 7 min  Past Medical History:  Past Medical History:  Diagnosis Date   Aortic stenosis, mild    Arthritis    Asthmatic bronchitis    with colds per patient   Carotid artery disease (HCC)    40-59% bilateral ICA stenosis   Cataract    Cutaneous abscess of right foot    Glaucoma    Heart murmur    Dr Felipe Horton is her  cardiologist.    Hyperlipemia    Hypertension    Dr. 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 Lorie Rook and Dr. Honey Lusty   Syncope    Type 1 diabetes mellitus [facility])    Past Surgical History:  Past Surgical History:  Procedure Laterality Date   ANKLE FRACTURE SURGERY Left 2001   steel plate and 3 screws    ANTERIOR CERVICAL CORPECTOMY N/A 07/31/2014   Procedure: Cervical Four to Cervical Six Corpectomy;  Surgeon: Adelbert Adler, MD;  Location: [facility];  Service: Neurosurgery;  Laterality: N/A;  C4 to C6 Corpectomy   BREAST SURGERY  1988   CATARACT EXTRACTION W/ INTRAOCULAR LENS  IMPLANT, BILATERAL Bilateral 2011   right and then left   EYE SURGERY     FRACTURE SURGERY     HAMMER TOE SURGERY  1998   I & D EXTREMITY Right 09/27/2018   Procedure: IRRIGATION AND DEBRIDEMENT RIGHT FOOT;  Surgeon: Timothy Ford, MD;  Location: [facility] OR;  Service: Orthopedics;  Laterality: Right;   INTRAOPERATIVE TRANSTHORACIC ECHOCARDIOGRAM N/A 02/06/2023   Procedure: INTRAOPERATIVE TRANSTHORACIC ECHOCARDIOGRAM;  Surgeon: Kyra Phy, MD;  Location: [facility] OR;  Service: Open Heart Surgery;  Laterality: N/A;   JOINT REPLACEMENT     PACEMAKER IMPLANT N/A 02/10/2019   Procedure: PACEMAKER IMPLANT;   Surgeon: Tammie Fall, MD;  Location: [facility];  Service: Cardiovascular;  Laterality: N/A;   RIGHT/LEFT HEART CATH AND CORONARY ANGIOGRAPHY N/A 01/11/2023   Procedure: RIGHT/LEFT HEART CATH AND CORONARY ANGIOGRAPHY;  Surgeon: Kyra Phy, MD;  Location: [facility];  Service: Cardiovascular;  Laterality: N/A;   TONSILLECTOMY AND ADENOIDECTOMY  1948   TOTAL SHOULDER REPLACEMENT  2010   right shoulder    TRANSCATHETER AORTIC VALVE REPLACEMENT, TRANSFEMORAL N/A 02/06/2023   Procedure: Transcatheter Aortic Valve Replacement, Transfemoral;  Surgeon: Thukkani, Arun K, MD;  Location: [facility] OR;  Service: Open Heart Surgery;  Laterality: N/A;   TUBAL LIGATION  1979   TYMPANOSTOMY TUBE PLACEMENT Bilateral    HPI:  Danielle Clarke is a 83 y.o. female who presented to the ER via EMS from home after being discharged from SNF rehab 2 days ago with complaints of chest pain and shortness of breath. Pt with medical history significant for type 1 diabetes, hyperlipidemia, aortic stenosis status post TAVR in 2024, dysphagia (recently started on dysphagia 1 diet), recently admitted for syncope (04/01/2024-04/04/2024) when she was found to have T12 compression fracture, dysphagia (underwent a barium swallow 04/06/2024-showed mild esophageal dysmotility), and acute hypoxic respiratory failure secondary to aspiration pneumonia, was weaned off oxygen supplementation.    Assessment / Plan / Recommendation  Clinical Impression  Pt presents with clinical indicators of pharyngeal dysphagia.  Pt with hx of mild oropharyngeal dysphagia and MBS 04/07/2022 revealed CP preventing transit of barium tablet.  Pt with esophagram completed during recent admission on 04/02/24 with mild dymotility. Pt treated for presumed aspiration pna that admission. Pt edorses eating pureed foods (applesauce, pudding) at baseline.  She appears cachectic.  Today pt exhibited multiple swallows and throat clearing with thin liquids and  puree.  Throat clearing delayed with puree.  Chest CT with "Bilateral lower lobe tree-in-bud nodules, right-greater-than-left, and subpleural right lower lobe consolidation, likely aspiration with developing right lower lobe pneumonia."    Recommend MBS for further assessment of pharyngeal swallo function, planned for later this morning.   SLP Visit Diagnosis: Dysphagia, unspecified (R13.10)    Aspiration Risk  Moderate aspiration risk;Risk for inadequate nutrition/hydration    Diet Recommendation  (Hold POs pending MBSS)    Medication Administration: Crushed with puree (priority oral meds only, pending MBS)    Other  Recommendations      Recommendations for follow up therapy are one component of a multi-disciplinary discharge planning process, led by the attending physician.  Recommendations may be updated based on patient status, additional functional criteria and insurance authorization.  Follow up Recommendations  (TBD)      Assistance Recommended at Discharge  N/A  Functional Status Assessment  (TBD)  Frequency and Duration  (TBD)          Prognosis Prognosis for improved oropharyngeal function:  (TBD)      Swallow Study   General Date of Onset: 04/16/24 HPI: Danielle Clarke is a 83 y.o. female who presented to the ER via EMS from home after being discharged from SNF rehab 2 days ago with complaints of chest pain and shortness of breath. Pt with medical history significant for type 1 diabetes, hyperlipidemia, aortic stenosis status post TAVR in 2024, dysphagia (recently started on dysphagia 1 diet), recently admitted for syncope (04/01/2024-04/04/2024) when she was found to have T12 compression fracture, dysphagia (underwent a barium swallow 04/06/2024-showed mild esophageal dysmotility), and acute hypoxic respiratory failure secondary to aspiration pneumonia, was weaned off oxygen supplementation. Type of Study: Bedside Swallow Evaluation Previous Swallow Assessment: BSE  04/03/24 with recs for D2/thin, MBSS in 2023 with CP Bar Diet Prior to this Study: Dysphagia 1 (pureed);Thin liquids (Level 0) Temperature Spikes Noted: No History of Recent Intubation: No Behavior/Cognition: Alert;Cooperative Oral Cavity Assessment: Within Functional Limits Oral Care Completed by SLP: No Oral Cavity - Dentition: Adequate natural dentition Baseline Vocal Quality:  (thin) Volitional Cough: Weak;Cognitively unable to elicit    Oral/Motor/Sensory Function Overall Oral Motor/Sensory Function: Mild impairment Facial ROM: Within Functional Limits Facial Symmetry: Within Functional Limits Lingual ROM: Within Functional Limits Lingual Symmetry: Within Functional Limits Lingual Strength: Reduced Velum: Within Functional Limits Mandible: Within Functional Limits   Ice Chips Ice chips: Not tested   Thin Liquid Thin Liquid: Impaired Presentation: Straw Pharyngeal  Phase Impairments: Throat Clearing - Immediate;Multiple swallows    Nectar Thick Nectar Thick Liquid: Not tested   Honey Thick Honey Thick Liquid: Not tested   Puree Puree: Impaired Pharyngeal Phase Impairments: Multiple swallows;Throat Clearing - Delayed   Solid     Solid: Not tested      Elester Grim, MA, CCC-SLP Acute Rehabilitation Services Office: [phone] 04/16/2024,9:26 AM

## 2024-04-16 NOTE — Progress Notes (Signed)
 PROGRESS NOTE  Danielle Clarke ZOX:[id number] DOB: 01-Oct-1941 DOA: 04/15/2024 PCP: Elester Grim, MD  HPI/Recap of past 24 hours: Danielle Clarke is a 83 y.o. female with medical history significant for type 1 diabetes, hyperlipidemia, aortic stenosis status post TAVR in 2024, dysphagia (recently started on dysphagia 1 diet), recently admitted for syncope (04/01/2024-04/04/2024) when she was found to have T12 compression fracture, dysphagia (underwent a barium swallow 04/06/2024-showed mild esophageal dysmotility), and acute hypoxic respiratory failure secondary to aspiration pneumonia, was weaned off oxygen supplementation, who presents to the ER via EMS from SNF with complaints of chest pain and shortness of breath.  Her family became concerned and wanted her evaluated in the ER. In the ER, the patient became aphasic with blank staring.  Code stroke was called.  Per neurology's assessment there was suspicion for possible focal seizure. Neurology consulted. Additionally, she had a CT angio chest PE done which was negative for pulmonary embolism, but showed bilateral lower lobe tree-in-bud nodules, right greater than left, subpleural right lower lobe consolidation likely aspiration with developing right lower lobe pneumonia.  The patient was started on empiric IV antibiotics.  Admitted by [facility], hospitalist service.    Today, pt denied any new complaints. Upset as she was hungry. Discussed with pt and daughter at bedside.   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 as per neurology MRI brain negative Patient refused EEG, neurology recommend Keppra to 50 mg every 12 hours Outpatient neurology follow-up Seizure precautions   Type 1 diabetes with hyperglycemia Basal insulin  and short acting insulin  Heart healthy carb modified diet   Dysphagia On dysphagia 1 diet Speech therapist consulted as above Aspiration precautions   Hyperlipidemia Resume home simvastatin    Chronic HFpEF Last 2D echo done on 02/15/2024 revealed LVEF 50 to 55% with grade 1 diastolic dysfunction Monitor strict I's and O's and daily weight   Incidental findings seen on CT scan: 0.7 x 0.9 cm ovoid density within the right breast.  Recommend correlation with dedicated breast imaging as clinically indicated.   Heterogeneous and mildly enlarged thyroid  gland.  TSH WNL Heterogeneous and lobular thyroid  gland without significant enlargement or hypervascularity, small 1.5 cm nodule in the right gland meets criteria for imaging surveillance.  Recommend follow-up ultrasound in 1 year  Pressure Injury 04/02/24 Sacrum Bilateral Stage 2 -  Partial thickness loss of dermis presenting as a shallow open injury with a red, pink wound bed without slough. 7.5x5 pink/red open area in center (Active)  04/02/24 0848  Location: Sacrum  Location Orientation: Bilateral  Staging: Stage 2 -  Partial thickness loss of dermis presenting as a shallow open injury with a red, pink wound bed without slough.  Wound Description (Comments): 7.5x5 pink/red open area in center  Present on Admission: Yes  Dressing Type None 04/16/24 1135     Estimated body mass index is 17.01 kg/m as calculated from the following:   Height as of this encounter: 5' (1.524 m).   Weight as of  this encounter: 39.5 kg.     Code Status: Full  Family  Communication: Discussed extensively with daughter at bedside  Disposition Plan: Status is: Inpatient Remains inpatient appropriate because: Level of care      Consultants: Neurology  Procedures: None  Antimicrobials: IV Levaquin   DVT prophylaxis: Lovenox    Objective: Vitals:   04/16/24 0545 04/16/24 0630 04/16/24 1123 04/16/24 1737  BP: (!) 139/59 (!) 167/45 (!) 134/41 (!) 136/39  Pulse:  62 67 79  Resp: 13 14 16 17   Temp:    98 F (36.7 C)  TempSrc:      SpO2:  100% 100% 100%  Weight:      Height:        Intake/Output Summary (Last 24 hours) at 04/16/2024 1922 Last data filed at 04/16/2024 1652 Gross per 24 hour  Intake 142.72 ml  Output --  Net 142.72 ml   Filed Weights   04/15/24 1531  Weight: 39.5 kg    Exam: General: NAD  Cardiovascular: S1, S2 present Respiratory: CTAB Abdomen: Soft, nontender, nondistended, bowel sounds present Musculoskeletal: No bilateral pedal edema noted Skin: Normal Psychiatry: Normal mood     Data Reviewed: CBC: Recent Labs  Lab 04/15/24 1526 04/15/24 1804 04/16/24 0647  WBC 10.0  --  8.2  NEUTROABS 7.8*  --   --   HGB 9.7* 8.8* 11.1*  HCT 29.4* 26.0* 34.5*  MCV 86.5  --  88.5  PLT 256  --  258   Basic Metabolic Panel: Recent Labs  Lab 04/15/24 1526 04/15/24 1804 04/16/24 0647  NA 132* 128* 132*  K 4.0 4.0 4.7  CL 99 91* 99  CO2 26  --  24  GLUCOSE 234* 179* 226*  BUN 26* 28* 19  CREATININE 0.79 0.80 0.62  CALCIUM 7.8*  --  8.3*  MG 1.9  --  2.1  PHOS  --   --  3.4   GFR: Estimated Creatinine Clearance: 34.4 mL/min (by C-G formula based on SCr of 0.62 mg/dL). Liver Function Tests: Recent Labs  Lab 04/15/24 1526  AST 19  ALT 23  ALKPHOS 66  BILITOT 0.4  PROT 5.6*  ALBUMIN 2.1*   No results for input(s): "LIPASE", "AMYLASE" in the last 168 hours. No results for input(s): "AMMONIA" in the last 168 hours. Coagulation Profile: Recent Labs  Lab 04/15/24 1726  INR 1.4*   Cardiac  Enzymes: No results for input(s): "CKTOTAL", "CKMB", "CKMBINDEX", "TROPONINI" in the last 168 hours. BNP (last 3 results) No results for input(s): "PROBNP" in the last 8760 hours. HbA1C: No results for input(s): "HGBA1C" in the last 72 hours. CBG: Recent Labs  Lab 04/15/24 2118 04/16/24 0527 04/16/24 0929 04/16/24 1240 04/16/24 1740  GLUCAP 90 183* 225* 173* 325*   Lipid Profile: No results for input(s): "CHOL", "HDL", "LDLCALC", "TRIG", "CHOLHDL", "LDLDIRECT" in the last 72 hours. Thyroid  Function Tests: Recent Labs    04/16/24 0647  TSH 2.121   Anemia Panel: No results for input(s): "VITAMINB12", "FOLATE", "FERRITIN", "TIBC", "IRON", "RETICCTPCT" in the last 72 hours. Urine analysis:    Component Value Date/Time   COLORURINE YELLOW 02/02/2023 0950   APPEARANCEUR CLEAR 02/02/2023 0950   LABSPEC 1.021 02/02/2023 0950   PHURINE 5.0 02/02/2023 0950   GLUCOSEU >=500 (A) 02/02/2023 0950   HGBUR NEGATIVE 02/02/2023 0950   BILIRUBINUR NEGATIVE 02/02/2023 0950   KETONESUR 5 (A) 02/02/2023 0950   PROTEINUR NEGATIVE 02/02/2023 0950   UROBILINOGEN 0.2 10/07/2014 1808   NITRITE NEGATIVE 02/02/2023 0950  LEUKOCYTESUR NEGATIVE 02/02/2023 0950   Sepsis Labs: @LABRCNTIP (procalcitonin:4,lacticidven:4)  ) Recent Results (from the past 240 hours)  Resp panel by RT-PCR (RSV, Flu A&B, Covid) Anterior Nasal Swab     Status: None   Collection Time: 04/15/24  3:57 PM   Specimen: Anterior Nasal Swab  Result Value Ref Range Status   SARS Coronavirus 2 by RT PCR NEGATIVE NEGATIVE Final   Influenza A by PCR NEGATIVE NEGATIVE Final   Influenza B by PCR NEGATIVE NEGATIVE Final    Comment: (NOTE) The Xpert Xpress SARS-CoV-2/FLU/RSV plus assay is intended as an aid in the diagnosis of influenza from Nasopharyngeal swab specimens and should not be used as a sole basis for treatment. Nasal washings and aspirates are unacceptable for Xpert Xpress SARS-CoV-2/FLU/RSV testing.  Fact Sheet  for Patients: BloggerCourse.com  Fact Sheet for Healthcare Providers: SeriousBroker.it  This test is not yet approved or cleared by the [place]  FDA and has been authorized for detection and/or diagnosis of SARS-CoV-2 by FDA under an Emergency Use Authorization (EUA). This EUA will remain in effect (meaning this test can be used) for the duration of the COVID-19 declaration under Section 564(b)(1) of the Act, 21 U.S.C. section 360bbb-3(b)(1), unless the authorization is terminated or revoked.     Resp Syncytial Virus by PCR NEGATIVE NEGATIVE Final    Comment: (NOTE) Fact Sheet for Patients: BloggerCourse.com  Fact Sheet for Healthcare Providers: SeriousBroker.it  This test is not yet approved or cleared by the [place]  FDA and has been authorized for detection and/or diagnosis of SARS-CoV-2 by FDA under an Emergency Use Authorization (EUA). This EUA will remain in effect (meaning this test can be used) for the duration of the COVID-19 declaration under Section 564(b)(1) of the Act, 21 U.S.C. section 360bbb-3(b)(1), unless the authorization is terminated or revoked.  Performed at [facility], [address] [place], [place] [address]       Studies: DG Swallowing Func-Speech Pathology Result Date: 04/16/2024 Table formatting from the original result was not included. Modified Barium Swallow Study Patient Details Name: Danielle Clarke MRN: [id number] Date of Birth: [birth year] Today's Date: 04/16/2024 HPI/PMH: HPI: Danielle Clarke is a 83 y.o. female who presented to the ER via EMS from home after being discharged from SNF rehab 2 days ago with complaints of chest pain and shortness of breath. Chest CT 5/20: "Bilateral lower lobe tree-in-bud nodules,  right-greater-than-left, and subpleural right lower lobe  consolidation, likely aspiration with developing right  lower lobe  pneumonia."  Pt with medical history significant for type 1 diabetes, hyperlipidemia, aortic stenosis status post TAVR in 2024, dysphagia (recently started on dysphagia 1 diet), recently admitted for syncope (04/01/2024-04/04/2024) when she was found to have T12 compression fracture. Hx of anterior cervical corpectomy C4-6 with fixation screws at C3 and C7 07/31/14. Has had two prior MBS studies, 02/11/19 and 04/07/22. Most recent study noted esophageal stasis, retention in pharynx, inability to pass pill through PES.  Esophagram 04/06/2024 showed mild esophageal dysmotility; review of imaging shows practical implications of dysmotility are likely significant. Clinical Impression: Clinical Impression: Pt presents with a mild pharyngeal dysphagia with a DIGEST score of 2.  Oral phase is generally functional with mildly prolonged oral preparation of solids. Most notable is cervical hardware from 2015 surgery that impacts full inversion of epiglottis over the larynx and leads to retention of solid foods between epiglottis and base of tongue.  There is a narrowing of the pharyngeal space and PES opening. Barium was  observed to be retained within the esophagus and occasionally backflowed through the PES and sat in the pyrforms.  There was effort on pt's part to transfer solids through the pharynx, occasionally bringing material back into oral cavity to propel it again. Thin and nectars spilled into the larynx intermittently, but pt had sufficient squeeze between arytenoids and base of epiglottis to protect the airway. There was no aspiration on today's study.  Aspiration, if it is occurring, is likely related to backflow and retention of material in esophagus.  Notes indicate pt has been on pureed diets/thin liquids - SLP will follow to determine if diet can be advanced.  Pills must be crushed - 2023 study revealed 13 mm barium pill lodged at the the entrance of the esophagus and was expectorated. DIGEST Swallow  Severity Rating*  Safety: 1  Efficiency: 2  Overall Pharyngeal Swallow Severity: 2 1: mild; 2: moderate; 3: severe; 4: profound *The Dynamic Imaging Grade of Swallowing Toxicity is standardized for the head and neck cancer population, however, demonstrates promising clinical applications across populations to standardize the clinical rating of pharyngeal swallow safety and severity. Factors that may increase risk of adverse event in presence of aspiration Roderick Civatte & Jessy Morocco 2021): Factors that may increase risk of adverse event in presence of aspiration Roderick Civatte & Jessy Morocco 2021): Frail or deconditioned; Respiratory or GI disease; Limited mobility; Weak cough Recommendations/Plan: Swallowing Evaluation Recommendations Swallowing Evaluation Recommendations Recommendations: PO diet PO Diet Recommendation: Dysphagia 1 (Pureed); Thin liquids (Level 0) Liquid Administration via: Cup; Straw Medication Administration: Crushed with puree Supervision: Staff to assist with self-feeding Postural changes: Stay upright 30-60 min after meals Oral care recommendations: Oral care BID (2x/day) Treatment Plan Treatment Plan Treatment recommendations: Therapy as outlined in treatment plan below Follow-up recommendations: No SLP follow up Functional status assessment: Patient has had a recent decline in their functional status and demonstrates the ability to make significant improvements in function in a reasonable and predictable amount of time. Treatment frequency: Min 2x/week Treatment duration: 1 week Interventions: Aspiration precaution training; Patient/family education; Trials of upgraded texture/liquids Recommendations Recommendations for follow up therapy are one component of a multi-disciplinary discharge planning process, led by the attending physician.  Recommendations may be updated based on patient status, additional functional criteria and insurance authorization. Assessment: Orofacial Exam: Orofacial Exam Oral Cavity -  Dentition: Adequate natural dentition Orofacial Anatomy: WFL Oral Motor/Sensory Function: WFL Anatomy: Anatomy: Presence of cervical hardware Boluses Administered: Boluses Administered Boluses Administered: Thin liquids (Level 0); Mildly thick liquids (Level 2, nectar thick); Puree; Solid  Oral Impairment Domain: Oral Impairment Domain Lip Closure: No labial escape Tongue control during bolus hold: Cohesive bolus between tongue to palatal seal Bolus preparation/mastication: Slow prolonged chewing/mashing with complete recollection Bolus transport/lingual motion: Brisk tongue motion Oral residue: Trace residue lining oral structures Initiation of pharyngeal swallow : Pyriform sinuses  Pharyngeal Impairment Domain: Pharyngeal Impairment Domain Soft palate elevation: No bolus between soft palate (SP)/pharyngeal wall (PW) Laryngeal elevation: Partial superior movement of thyroid  cartilage/partial approximation of arytenoids to epiglottic petiole Anterior hyoid excursion: Complete anterior movement Epiglottic movement: Partial inversion Laryngeal vestibule closure: Incomplete, narrow column air/contrast in laryngeal vestibule Pharyngeal stripping wave : Present - diminished Pharyngeal contraction (A/P view only): N/A Pharyngoesophageal segment opening: Partial distention/partial duration, partial obstruction of flow Tongue base retraction: Narrow column of contrast or air between tongue base and PPW Pharyngeal residue: Collection of residue within or on pharyngeal structures Location of pharyngeal residue: Valleculae; Pharyngeal wall  Esophageal Impairment Domain: Esophageal Impairment Domain  Esophageal clearance upright position: Esophageal retention with retrograde flow through the PES Pill: Pill Consistency administered: -- (NT) Penetration/Aspiration Scale Score: Penetration/Aspiration Scale Score 1.  Material does not enter airway: Puree; Solid 2.  Material enters airway, remains ABOVE vocal cords then ejected out:  Thin liquids (Level 0) 3.  Material enters airway, remains ABOVE vocal cords and not ejected out: Mildly thick liquids (Level 2, nectar thick) Compensatory Strategies: Compensatory Strategies Compensatory strategies: Yes Straw: Effective   General Information: Caregiver present: No  Diet Prior to this Study: Dysphagia 1 (pureed); Thin liquids (Level 0)   Temperature : Normal   No data recorded  Supplemental O2: Nasal cannula   History of Recent Intubation: No  Behavior/Cognition: Alert; Cooperative No data recorded Baseline vocal quality/speech: Normal No data recorded No data recorded Exam Limitations: No limitations Goal Planning: Prognosis for improved oropharyngeal function: Guarded No data recorded No data recorded Patient/Family Stated Goal: not stated No data recorded Pain: Pain Assessment Pain Assessment: Faces Faces Pain Scale: 2 Pain Location: generalized with mobility Pain Descriptors / Indicators: Discomfort Pain Intervention(s): Limited activity within patient's tolerance; Monitored during session End of Session: Start Time:SLP Start Time (ACUTE ONLY): 1505 Stop Time: SLP Stop Time (ACUTE ONLY): 1530 Time Calculation:SLP Time Calculation (min) (ACUTE ONLY): 25 min Charges: SLP Evaluations $ SLP Speech Visit: 1 Visit SLP Evaluations $BSS Swallow: 1 Procedure $MBS Swallow: 1 Procedure $Swallowing Treatment: 1 Procedure SLP visit diagnosis: SLP Visit Diagnosis: Dysphagia, pharyngeal phase (R13.13) Past Medical History: Past Medical History: Diagnosis Date  Aortic stenosis, mild   Arthritis   Asthmatic bronchitis   with colds per patient  Carotid artery disease (HCC)   40-59% bilateral ICA stenosis  Cataract   Cutaneous abscess of right foot   Glaucoma   Heart murmur   Dr Felipe Horton is her  cardiologist.   Hyperlipemia   Hypertension   Dr. Felipe Horton ~ 2 years ago  Neck fracture [facility])   july 2013  Osteoporosis   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  20mm S3UR via TF approach with Dr. Lorie Rook  and Dr. Honey Lusty  Syncope   Type 1 diabetes mellitus [facility])  Past Surgical History: Past Surgical History: Procedure Laterality Date  ANKLE FRACTURE SURGERY Left 2001  steel plate and 3 screws   ANTERIOR CERVICAL CORPECTOMY N/A 07/31/2014  Procedure: Cervical Four to Cervical Six Corpectomy;  Surgeon: Adelbert Adler, MD;  Location: [facility]UR[facility] ORS;  Service: Neurosurgery;  Laterality: N/A;  C4 to C6 Corpectomy  BREAST SURGERY  1988  CATARACT EXTRACTION W/ INTRAOCULAR LENS  IMPLANT, BILATERAL Bilateral 2011  right and then left  EYE SURGERY    FRACTURE SURGERY    HAMMER TOE SURGERY  1998  I & D EXTREMITY Right 09/27/2018  Procedure: IRRIGATION AND DEBRIDEMENT RIGHT FOOT;  Surgeon: Timothy Ford, MD;  Location: [facility] OR;  Service: Orthopedics;  Laterality: Right;  INTRAOPERATIVE TRANSTHORACIC ECHOCARDIOGRAM N/A 02/06/2023  Procedure: INTRAOPERATIVE TRANSTHORACIC ECHOCARDIOGRAM;  Surgeon: Kyra Phy, MD;  Location: [facility] OR;  Service: Open Heart Surgery;  Laterality: N/A;  JOINT REPLACEMENT    PACEMAKER IMPLANT N/A 02/10/2019  Procedure: PACEMAKER IMPLANT;  Surgeon: Tammie Fall, MD;  Location: [facility] INVASIVE CV LAB;  Service: Cardiovascular;  Laterality: N/A;  RIGHT/LEFT HEART CATH AND CORONARY ANGIOGRAPHY N/A 01/11/2023  Procedure: RIGHT/LEFT HEART CATH AND CORONARY ANGIOGRAPHY;  Surgeon: Kyra Phy, MD;  Location: [facility] INVAS[facility] CV LAB;  Service: Cardiovascular;  Laterality: N/A;  TONSILLECTOMY AND ADENOIDECTOMY  1948  TOTAL SHOULDER  REPLACEMENT  2010  right shoulder   TRANSCATHETER AORTIC VALVE REPLACEMENT, TRANSFEMORAL N/A 02/06/2023  Procedure: Transcatheter Aortic Valve Replacement, Transfemoral;  Surgeon: Thukkani, Arun K, MD;  Location: [facility] OR;  Service: Open Heart Surgery;  Laterality: N/A;  TUBAL LIGATION  1979  TYMPANOSTOMY TUBE PLACEMENT Bilateral  Amanda L. Beatris Lincoln, MA CCC/SLP Clinical Specialist - Acute Care SLP Acute Rehabilitation Services Office number [phone] Danielle Clarke 04/16/2024, 4:26  PM  MR BRAIN WO CONTRAST Result Date: 04/16/2024 CLINICAL DATA:  Seizure, new onset, history of trauma EXAM: MRI HEAD WITHOUT CONTRAST TECHNIQUE: Multiplanar, multiecho pulse sequences of the brain and surrounding structures were obtained without intravenous contrast. COMPARISON:  Head CTA from yesterday FINDINGS: Brain: Dense encephalomalacia in the anterior left frontal lobe with adjacent gliosis and volume loss, site of ICH in 2020. Symmetric anterior temporal T2 hyperintensity with volume loss, likely prior contusion. Mild T2 hyperintensity in the cerebral white matter and pons usually from chronic small vessel ischemia. Brain atrophy. No acute infarct, acute hemorrhage, hydrocephalus, or collection. Vascular: Major flow voids are preserved Skull and upper cervical spine: Extensive cervical spine fusion. Sinuses/Orbits: Chronic sinusitis with complete left maxillary and right sphenoid sinus opacification. Associated sclerotic wall thickening by CT which is even more extensive. No acute finding in the orbits IMPRESSION: No acute finding when compared to prior. Left frontal and bilateral anterior temporal encephalomalacia including cortex. Generalized atrophy. Chronic sinusitis. Electronically Signed   By: Ronnette Coke M.D.   On: 04/16/2024 10:34   US  THYROID  Result Date: 04/16/2024 CLINICAL DATA:  Palpable abnormality. EXAM: THYROID  ULTRASOUND TECHNIQUE: Ultrasound examination of the thyroid  gland and adjacent soft tissues was performed. COMPARISON:  None Available. FINDINGS: Parenchymal Echotexture: Markedly heterogenous Isthmus: 0.3 cm Right lobe: 3.9 x 2.1 x 2.0 cm Left lobe: 3.0 x 1.9 x 1.7 cm _________________________________________________________ Estimated total number of nodules >/= 1 cm: 3 Number of spongiform nodules >/=  2 cm not described below (TR1): 0 Number of mixed cystic and solid nodules >/= 1.5 cm not described below (TR2): 0 _________________________________________________________  Markedly heterogeneous thyroid  parenchyma. The thyroid  gland is lobular with numerous areas of pseudo nodularity. Vascularity is normal on color Doppler imaging. Nodule # 1: Isoechoic solid nodule in the right mid gland measures 1.3 x 1.1 x 0.8 cm. TI-RADS category 3. Given size (<1.4 cm) and appearance, this nodule does NOT meet TI-RADS criteria for biopsy or dedicated follow-up. Nodule # 2: Isoechoic solid nodule in the right mid gland measures 1.5 x 1.3 x 1.5 cm. Margins are ill-defined. TI-RADS category 3. *Given size (>/= 1.5 - 2.4 cm) and appearance, a follow-up ultrasound in 1 year should be considered based on TI-RADS criteria. Nodule # 3: Isoechoic solid nodule in the left mid gland measures 1.2 x 0.9 x 0.6 cm. TI-RADS category 3. Given size (<1.4 cm) and appearance, this nodule does NOT meet TI-RADS criteria for biopsy or dedicated follow-up. IMPRESSION: 1. Heterogeneous and lobular thyroid  gland without significant enlargement or hypervascularity. 2. Small 1.5 cm TI-RADS category 3 nodule in the right gland meets criteria for imaging surveillance. Recommend follow-up ultrasound in 1 year. The above is in keeping with the ACR TI-RADS recommendations - J Am Coll Radiol 2017;14:587-595. Electronically Signed   By: Fernando Hoyer M.D.   On: 04/16/2024 05:23    Scheduled Meds:  aspirin  EC  81 mg Oral Daily   enoxaparin  (LOVENOX ) injection  30 mg Subcutaneous Q24H   feeding supplement (GLUCERNA SHAKE)  237 mL Oral TID BM  insulin  aspart  0-5 Units Subcutaneous QHS   insulin  aspart  0-9 Units Subcutaneous TID WC   insulin  aspart  3 Units Subcutaneous TID WC   insulin  glargine-yfgn  5 Units Subcutaneous BID   levETIRAcetam  250 mg Oral BID   simvastatin   40 mg Oral QHS    Continuous Infusions:  levofloxacin  (LEVAQUIN ) IV Stopped (04/16/24 1013)     LOS: 1 day     Veronica Gordon, MD [facility]  If 7PM-7AM, please contact night-coverage [email or web address] 04/16/2024, 7:22 PM

## 2024-04-16 NOTE — Progress Notes (Signed)
 Speech Language Pathology Treatment: Dysphagia  Patient Details Name: Danielle Clarke MRN: [id number] DOB: 1941/05/06 Today's Date: 04/16/2024 Time: 1914-7829 SLP Time Calculation (min) (ACUTE ONLY): 7 min  Assessment / Plan / Recommendation Clinical Impression  Attempted to see pt for MBSS at 10 am.  Pt refused transport and stated she wanted to eat.  Radiology informed pt that test was to determine safety to eat and pt continued to refuse study.  SLP arrived in room with pt consuming puree tray with NT.  Throat clearing noted following puree.  SLP provided education regarding aspiration pneumonia, pt's recent hx of pna and current imaging report reflecting possible pna, and risks of continued oral intake with possible aspiration. Pt was not receptive to education and feels NPO recommendations are personally motivated.  She requested SLP to leave. Radiology has availability at 2:30 PM for MBS and PT asked pt during that session if pt would be amenable to study at that time.  Pt continues to refuse MBSS. Reached out to MD via secure chat.  SLP cannot make safe diet recommendation clinically.  Will leave diet decision to attending to determine risk of continuing PO intake.  Recommend pt remain NPO with alternate means of nutrition, hydration, and medication. Pt may have ice chips by spoon, in moderation, after good oral care, when fully awake/alert, with upright positioning and direct 1:1 assistance.     HPI HPI: Danielle Clarke is a 83 y.o. female who presented to the ER via EMS from home after being discharged from SNF rehab 2 days ago with complaints of chest pain and shortness of breath. Chest CT 5/20: "Bilateral lower lobe tree-in-bud nodules,  right-greater-than-left, and subpleural right lower lobe  consolidation, likely aspiration with developing right lower lobe  pneumonia."  Pt with medical history significant for type 1 diabetes, hyperlipidemia, aortic stenosis status post TAVR in  2024, dysphagia (recently started on dysphagia 1 diet), recently admitted for syncope (04/01/2024-04/04/2024) when she was found to have T12 compression fracture, dysphagia (underwent a barium swallow 04/06/2024-showed mild esophageal dysmotility), and acute hypoxic respiratory failure secondary to aspiration pneumonia, was weaned off oxygen supplementation.      SLP Plan  MBS      Recommendations for follow up therapy are one component of a multi-disciplinary discharge planning process, led by the attending physician.  Recommendations may be updated based on patient status, additional functional criteria and insurance authorization.    Recommendations  Diet recommendations: NPO Medication Administration:  (NPO)                  Oral care QID;Oral care prior to ice chip/H20     Dysphagia, unspecified (R13.10)     MBS     Elester Grim, MA, CCC-SLP Acute Rehabilitation Services Office: [phone] 04/16/2024, 10:38 AM

## 2024-04-16 NOTE — Evaluation (Addendum)
 Modified Barium Swallow Study  Patient Details  Name: Danielle Clarke MRN: [id number] Date of Birth: 30-Jan-1941  Today's Date: 04/16/2024  Modified Barium Swallow completed.  Full report located under Chart Review in the Imaging Section.  History of Present Illness Danielle Clarke is a 83 y.o. female who presented to the ER via EMS from home after being discharged from SNF rehab 2 days ago with complaints of chest pain and shortness of breath. Chest CT 5/20: "Bilateral lower lobe tree-in-bud nodules,  right-greater-than-left, and subpleural right lower lobe  consolidation, likely aspiration with developing right lower lobe  pneumonia."  Pt with medical history significant for type 1 diabetes, hyperlipidemia, aortic stenosis status post TAVR in 2024, dysphagia (recently started on dysphagia 1 diet), recently admitted for syncope (04/01/2024-04/04/2024) when she was found to have T12 compression fracture. Hx of anterior cervical corpectomy C4-6 with fixation screws at C3 and C7 07/31/14. Has had two prior MBS studies, 02/11/19 and 04/07/22. Most recent study noted esophageal stasis, retention in pharynx, inability to pass pill through PES.  Esophagram 04/06/2024 showed mild esophageal dysmotility; review of imaging shows practical implications of dysmotility are likely significant.   Clinical Impression Pt presents with a moderate pharyngeal dysphagia with a DIGEST score of 2.  Oral phase is generally functional with mildly prolonged oral preparation of solids. Most notable is cervical hardware from 2015 surgery that impacts full inversion of epiglottis over the larynx and leads to retention of solid foods between epiglottis and base of tongue.  There is a narrowing of the pharyngeal space and PES opening. Presence of CP bar. Barium was observed to be retained within the esophagus and occasionally backflowed through the PES and sat in the pyrforms.  There was effort on pt's part to transfer solids through  the pharynx, occasionally bringing material back into oral cavity to propel it again. Thin and nectars spilled into the larynx intermittently, but pt had sufficient squeeze between arytenoids and base of epiglottis to protect the airway. There was no aspiration on today's study.  Aspiration, if it is occurring, is likely related to backflow and retention of material in esophagus.  Notes indicate pt has been on pureed diets/thin liquids - SLP will follow to determine if diet can be advanced.  Pills must be crushed - 2023 study revealed 13 mm barium pill lodged at the the entrance of the esophagus and was expectorated.  DIGEST Swallow Severity Rating*  Safety: 1  Efficiency: 2  Overall Pharyngeal Swallow Severity: 2 1: mild; 2: moderate; 3: severe; 4: profound  *The Dynamic Imaging Grade of Swallowing Toxicity is standardized for the head and neck cancer population, however, demonstrates promising clinical applications across populations to standardize the clinical rating of pharyngeal swallow safety and severity.  Factors that may increase risk of adverse event in presence of aspiration Roderick Civatte & Jessy Morocco 2021): Frail or deconditioned;Respiratory or GI disease;Limited mobility;Weak cough  Swallow Evaluation Recommendations Recommendations: PO diet PO Diet Recommendation: Dysphagia 1 (Pureed);Thin liquids (Level 0) Liquid Administration via: Cup;Straw Medication Administration: Crushed with puree Supervision: Staff to assist with self-feeding Postural changes: Stay upright 30-60 min after meals Oral care recommendations: Oral care BID (2x/day)    Mallika Sanmiguel L. Beatris Lincoln, MA CCC/SLP Clinical Specialist - Acute Care SLP Acute Rehabilitation Services Office number [phone]   Danielle Clarke 04/16/2024,4:24 PM

## 2024-04-16 NOTE — TOC Initial Note (Signed)
 Transition of Care [facility]) - Initial/Assessment Note    Patient Details  Name: Danielle Clarke MRN: [id number] Date of Birth: 11-02-1941  [facility] of [facility] CM/SW Contact:    Danielle Wurtz A [place], LCSW Phone Number: 04/16/2024, 4:19 PM  Clinical Narrative:                  CSW spoke with pt's daughter, Danielle Clarke, as pt was not in the room, off the floor for procedure.   She said that pt is from [facility] for short term rehab and spoke with [place], they are aware pt is in hospital, and able to return with authorization approval.  She said pt would prefer to go back home with home health but not sure if pt is at that point to be able to return home with just home health services.   Home health was set up just before pt's previous admission to the hospital, but could not recall the home health agency the PCP assisted pt in getting set up.   Pt also has caregiver that comes about 4 hrs per day to assist with pt's needs.   Disposition plan home with home health versus SNF.    TOC will continue to follow.   Expected Discharge Plan: Skilled Nursing Facility     Patient Goals and CMS Choice            Expected Discharge Plan and Services                                              Prior Living Arrangements/Services              Need for Family Participation in Patient Care: Yes (Comment) Care giver support system in place?: Yes (comment) (Danielle Clarke)      Activities of Daily Living   ADL Screening (condition at time of admission) Independently performs ADLs?: No Does the patient have a NEW difficulty with bathing/dressing/toileting/self-feeding that is expected to last >3 days?: Yes (Initiates electronic notice to provider for possible OT consult) Does the patient have a NEW difficulty with getting in/out of bed, walking, or climbing stairs that is expected to last >3 days?: Yes (Initiates electronic notice to provider for possible PT consult) Does the  patient have a NEW difficulty with communication that is expected to last >3 days?: No Is the patient deaf or have difficulty hearing?: No Does the patient have difficulty seeing, even when wearing glasses/contacts?: No Does the patient have difficulty concentrating, remembering, or making decisions?: Yes  Permission Sought/Granted                  Emotional Assessment Appearance:: Appears older than stated age Attitude/Demeanor/Rapport: Unable to Assess Affect (typically observed): Unable to Assess Orientation: : Oriented to Self, Oriented to Place, Oriented to Situation Alcohol / Substance Use: Not Applicable Psych Involvement: No (comment)  Admission diagnosis:  Aphasia [R47.01] Focal seizure (HCC) [R56.9] Pneumonia of right lower lobe due to infectious organism [J18.9] Patient Active Problem List   Diagnosis Date Noted   Pneumonia of right lower lobe due to infectious organism 04/15/2024   Protein-calorie malnutrition, severe 04/03/2024   Choking 04/01/2024   Acute hypoxic respiratory failure (HCC) 04/01/2024   Compression fracture of T12 vertebra (HCC) 04/01/2024   Shingles 04/01/2024   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   Closed comminuted left  humeral fracture 11/19/2021   Prolonged QT interval 11/19/2021   Elevated CK 11/19/2021   CKD (chronic kidney disease), stage III (HCC) 10/14/2020   Abnormality of gait 12/01/2019   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) 12/01/2019   Traumatic brain injury with loss of consciousness (HCC) 05/28/2019   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 05/20/2019   Hypertensive crisis    Hypokalemia    Leukocytosis    Lethargy    Labile blood pressure    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    Acute blood loss anemia    Low grade fever    Hypoglycemia    Multiple closed fractures of ribs of right side    Pleural effusion on right     Hypoalbuminemia due to protein-calorie malnutrition (HCC)    Dysphagia    Traumatic cerebral intraparenchymal hematoma (HCC) 02/14/2019   Pacemaker    Labile blood glucose    Diabetes mellitus type 2 in nonobese (HCC)    Dyslipidemia    Essential hypertension    Hypernatremia    Anemia of chronic disease    Hypertensive urgency 02/07/2019   Intracerebral hemorrhage (HCC) 02/07/2019   Intracranial bleeding (HCC) 02/06/2019   Elevated troponin    Abnormal EKG    Bronchitis 08/05/2017   Bilateral carotid artery stenosis    Aortic stenosis 08/25/2015   Cervical stenosis of spinal canal 07/31/2014   Essential hypertension, benign 12/30/2013   Hyperlipidemia 12/30/2013   Toe infection 12/30/2013   Acute renal failure (HCC) 12/29/2013   Loss of consciousness (HCC) 01/02/2013   Diabetes mellitus type 1 (HCC) 01/02/2013   Bilateral carotid artery disease (HCC) 01/02/2013   GOITER, MULTINODULAR 02/11/2008   PCP:  Danielle Grim, MD Pharmacy:   [facility] [address], [place] - [address] [place] [address] Phone: [phone] Fax: [phone]     Social Drivers of Health (SDOH) Social History: SDOH Screenings   Food Insecurity: No Food Insecurity (04/16/2024)  Housing: Low Risk  (04/16/2024)  Transportation Needs: No Transportation Needs (04/16/2024)  Utilities: Not At Risk (04/16/2024)  Depression (PHQ2-9): Low Risk  (05/28/2019)  Social Connections: Unknown (04/16/2024)  Tobacco Use: Low Risk  (04/16/2024)   SDOH Interventions:     Readmission Risk Interventions    04/02/2024   11:26 AM 02/07/2023   12:07 PM  Readmission Risk Prevention Plan  Post Dischage Appt  Complete  Medication Screening  Complete  Transportation Screening Complete Complete  PCP or Specialist Appt within 5-7 Days Complete   Home Care Screening Complete   Medication Review (RN CM) Referral to Pharmacy

## 2024-04-17 DIAGNOSIS — J189 Pneumonia, unspecified organism: Secondary | ICD-10-CM | POA: Diagnosis not present

## 2024-04-17 LAB — GLUCOSE, CAPILLARY
Glucose-Capillary: 117 mg/dL — ABNORMAL HIGH (ref 70–99)
Glucose-Capillary: 119 mg/dL — ABNORMAL HIGH (ref 70–99)
Glucose-Capillary: 180 mg/dL — ABNORMAL HIGH (ref 70–99)
Glucose-Capillary: 180 mg/dL — ABNORMAL HIGH (ref 70–99)
Glucose-Capillary: 357 mg/dL — ABNORMAL HIGH (ref 70–99)
Glucose-Capillary: 386 mg/dL — ABNORMAL HIGH (ref 70–99)
Glucose-Capillary: 399 mg/dL — ABNORMAL HIGH (ref 70–99)

## 2024-04-17 LAB — CBC WITH DIFFERENTIAL/PLATELET
Abs Immature Granulocytes: 0.01 10*3/uL (ref 0.00–0.07)
Basophils Absolute: 0.1 10*3/uL (ref 0.0–0.1)
Basophils Relative: 2 %
Eosinophils Absolute: 0.1 10*3/uL (ref 0.0–0.5)
Eosinophils Relative: 2 %
HCT: 31.7 % — ABNORMAL LOW (ref 36.0–46.0)
Hemoglobin: 10.7 g/dL — ABNORMAL LOW (ref 12.0–15.0)
Immature Granulocytes: 0 %
Lymphocytes Relative: 24 %
Lymphs Abs: 1.4 10*3/uL (ref 0.7–4.0)
MCH: 28.6 pg (ref 26.0–34.0)
MCHC: 33.8 g/dL (ref 30.0–36.0)
MCV: 84.8 fL (ref 80.0–100.0)
Monocytes Absolute: 0.4 10*3/uL (ref 0.1–1.0)
Monocytes Relative: 8 %
Neutro Abs: 3.6 10*3/uL (ref 1.7–7.7)
Neutrophils Relative %: 64 %
Platelets: 253 10*3/uL (ref 150–400)
RBC: 3.74 MIL/uL — ABNORMAL LOW (ref 3.87–5.11)
RDW: 16 % — ABNORMAL HIGH (ref 11.5–15.5)
WBC: 5.6 10*3/uL (ref 4.0–10.5)
nRBC: 0 % (ref 0.0–0.2)

## 2024-04-17 LAB — BASIC METABOLIC PANEL WITH GFR
Anion gap: 9 (ref 5–15)
BUN: 16 mg/dL (ref 8–23)
CO2: 27 mmol/L (ref 22–32)
Calcium: 8.3 mg/dL — ABNORMAL LOW (ref 8.9–10.3)
Chloride: 100 mmol/L (ref 98–111)
Creatinine, Ser: 0.57 mg/dL (ref 0.44–1.00)
GFR, Estimated: >60 mL/min (ref >60)
Glucose, Bld: 98 mg/dL (ref 70–99)
Potassium: 3.8 mmol/L (ref 3.5–5.1)
Sodium: 136 mmol/L (ref 135–145)

## 2024-04-17 MED ORDER — MEGESTROL ACETATE 20 MG PO TABS
20.0000 mg | ORAL_TABLET | Freq: Two times a day (BID) | ORAL | Status: DC
  Administered 2024-04-17 – 2024-04-25 (×15): 20 mg via ORAL
  Filled 2024-04-17 (×17): qty 1

## 2024-04-17 MED ORDER — ALUM & MAG HYDROXIDE-SIMETH 200-200-20 MG/5ML PO SUSP
30.0000 mL | Freq: Four times a day (QID) | ORAL | Status: DC | PRN
  Administered 2024-04-17: 30 mL
  Filled 2024-04-17 (×2): qty 30

## 2024-04-17 MED ORDER — INSULIN ASPART 100 UNIT/ML IJ SOLN
2.0000 [IU] | Freq: Three times a day (TID) | INTRAMUSCULAR | Status: DC
  Administered 2024-04-17: 2 [IU] via SUBCUTANEOUS

## 2024-04-17 MED ORDER — METOPROLOL TARTRATE 25 MG PO TABS
25.0000 mg | ORAL_TABLET | Freq: Every day | ORAL | Status: DC
  Administered 2024-04-18: 25 mg via ORAL
  Filled 2024-04-17 (×3): qty 1

## 2024-04-17 MED ORDER — INSULIN ASPART 100 UNIT/ML IJ SOLN
3.0000 [IU] | Freq: Three times a day (TID) | INTRAMUSCULAR | Status: DC
  Administered 2024-04-18 – 2024-04-25 (×17): 3 [IU] via SUBCUTANEOUS

## 2024-04-17 MED ORDER — PANTOPRAZOLE SODIUM 40 MG PO TBEC
40.0000 mg | DELAYED_RELEASE_TABLET | Freq: Every day | ORAL | Status: DC
  Administered 2024-04-17 – 2024-04-18 (×2): 40 mg via ORAL
  Filled 2024-04-17 (×2): qty 1

## 2024-04-17 MED ORDER — PROSOURCE PLUS PO LIQD
30.0000 mL | Freq: Every day | ORAL | Status: DC
  Administered 2024-04-17 – 2024-04-25 (×7): 30 mL via ORAL
  Filled 2024-04-17 (×8): qty 30

## 2024-04-17 MED ORDER — LEVOFLOXACIN 250 MG PO TABS
250.0000 mg | ORAL_TABLET | Freq: Every day | ORAL | Status: DC
  Filled 2024-04-17: qty 1

## 2024-04-17 NOTE — Progress Notes (Signed)
 Speech Language Pathology Treatment: Dysphagia  Patient Details Name: Danielle Clarke MRN: [id number] DOB: 15-May-1941 Today's Date: 04/17/2024 Time: 2956-2130 SLP Time Calculation (min) (ACUTE ONLY): 32 min  Assessment / Plan / Recommendation Clinical Impression  F/u after 5/21 MBS. Dtr [name] at bedside. Pt declined any POs today.  We reviewed imaging from MBS, results, recommendations related to esophageal precautions and strategies that could help minimize post-prandial aspiration. We discussed mobility and oral care as areas we can control to help minimize adverse affects of aspiration.   Will advance diet to dysphagia 2/minced; continue to crush meds.  Answered [name]' questions; she demonstrated good understanding related to chronicity of dysphagia and difficulty overcoming these conditions.  SLP will f/u briefly with dys2.    HPI HPI: Danielle Clarke is a 83 y.o. female who presented to the ER via EMS from home after being discharged from SNF rehab 2 days ago with complaints of chest pain and shortness of breath. Chest CT 5/20: "Bilateral lower lobe tree-in-bud nodules,  right-greater-than-left, and subpleural right lower lobe  consolidation, likely aspiration with developing right lower lobe  pneumonia."  Pt with medical history significant for type 1 diabetes, hyperlipidemia, aortic stenosis status post TAVR in 2024, dysphagia (recently started on dysphagia 1 diet), recently admitted for syncope (04/01/2024-04/04/2024) when she was found to have T12 compression fracture. Hx of anterior cervical corpectomy C4-6 with fixation screws at C3 and C7 07/31/14. Has had two prior MBS studies, 02/11/19 and 04/07/22. Most recent study noted esophageal stasis, retention in pharynx, inability to pass pill through PES.  Esophagram 04/06/2024 showed mild esophageal dysmotility; review of imaging shows practical implications of dysmotility are likely significant.      SLP Plan  Continue with current plan of  care      Recommendations for follow up therapy are one component of a multi-disciplinary discharge planning process, led by the attending physician.  Recommendations may be updated based on patient status, additional functional criteria and insurance authorization.    Recommendations  Diet recommendations: Dysphagia 2 (fine chop);Thin liquid Liquids provided via: Cup;Straw Medication Administration: Crushed with puree Supervision: Staff to assist with self feeding Compensations: Slow rate;Small sips/bites;Follow solids with liquid Postural Changes and/or Swallow Maneuvers: Seated upright 90 degrees;Upright 30-60 min after meal                  Oral care BID     Dysphagia, pharyngeal phase (R13.13)     Continue with current plan of care    Hudsen Fei L. Beatris Lincoln, MA CCC/SLP Clinical Specialist - Acute Care SLP Acute Rehabilitation Services Office number [phone]  Myna Asal Laurice  04/17/2024, 1:15 PM

## 2024-04-17 NOTE — Telephone Encounter (Signed)
 Monitor updated 04/16/2024.

## 2024-04-17 NOTE — Progress Notes (Signed)
 OT Cancellation Note  Patient Details Name: Danielle Clarke MRN: [id number] DOB: 09-Dec-1940   Cancelled Treatment:    Reason Eval/Treat Not Completed: Fatigue/lethargy limiting ability to participate;Patient declined, no reason specified (Attempted to arouse patient several times for therapy, she continued to return to deep sleep and became agitated with last attempt of arousal. Asking us  to go away. OT will follow-up with pt as able)  04/17/2024  AB, OTR/L  Acute Rehabilitation Services  Office: [phone]   Danielle Clarke 04/17/2024, 9:57 AM

## 2024-04-17 NOTE — Plan of Care (Signed)

## 2024-04-17 NOTE — Progress Notes (Signed)
 PROGRESS NOTE  BRISIA SCHUERMANN BJY:[id number] DOB: Mar 14, 1941 DOA: 04/15/2024 PCP: Elester Grim, MD  HPI/Recap of past 24 hours: Danielle Clarke is a 83 y.o. female with medical history significant for type 1 diabetes, hyperlipidemia, aortic stenosis status post TAVR in 2024, dysphagia (recently started on dysphagia 1 diet), recently admitted for syncope (04/01/2024-04/04/2024) when she was found to have T12 compression fracture, dysphagia (underwent a barium swallow 04/06/2024-showed mild esophageal dysmotility), and acute hypoxic respiratory failure secondary to aspiration pneumonia, was weaned off oxygen supplementation, who presents to the ER via EMS from SNF with complaints of chest pain and shortness of breath.  Her family became concerned and wanted her evaluated in the ER. In the ER, the patient became aphasic with blank staring.  Code stroke was called.  Per neurology's assessment there was suspicion for possible focal seizure. Neurology consulted. Additionally, she had a CT angio chest PE done which was negative for pulmonary embolism, but showed bilateral lower lobe tree-in-bud nodules, right greater than left, subpleural right lower lobe consolidation likely aspiration with developing right lower lobe pneumonia.  The patient was started on empiric IV antibiotics.  Admitted by [facility], hospitalist service.     Today, pt noted to be non-compliant with medical care, refusing labs/meds etc and being rude with staff members this am. Appeared to be more compliant this afternoon. Daughter and caregiver at bedside.    Assessment/Plan: Principal Problem:   Pneumonia of right lower lobe due to infectious organism   Right lower lobe pneumonia, POA Aspiration suspected Currently on o2 saturting 100%, plan to wean off Resume home dysphagia 1 diet Speech therapist consulted, appreciate recs  Aspiration precautions Completed 3 days of IV levaquin    Chest tightness, likely  noncardiac Resolved High sensitive troponin negative x 2 No evidence of acute ischemia on twelve-lead EKG Monitor on telemetry   Code stroke Aphasia, blank staring, resolved Possible focal seizure from a prior left frontal traumatic ICH/encephalomalacia as per neurology MRI brain negative Patient refused EEG, neurology recommend Keppra to 50 mg every 12 hours Outpatient neurology follow-up Seizure precautions   Type 1 diabetes with hyperglycemia Basal insulin  and short acting insulin  Heart healthy carb modified diet   Dysphagia On dysphagia 2 diet Speech therapist consulted as above Aspiration precautions   Hyperlipidemia Resume home simvastatin    Chronic HFpEF Last 2D echo done on 02/15/2024 revealed LVEF 50 to 55% with grade 1 diastolic dysfunction Monitor strict I's and O's and daily weight   Incidental findings seen on CT scan: 0.7 x 0.9 cm ovoid density within the right breast.  Recommend correlation with dedicated breast imaging as clinically indicated.   Heterogeneous and mildly enlarged thyroid  gland.  TSH WNL Heterogeneous and lobular thyroid  gland without significant enlargement or hypervascularity, small 1.5 cm nodule in the right gland meets criteria for imaging surveillance.  Recommend follow-up ultrasound in 1 year  Pressure Injury 04/02/24 Sacrum Bilateral Stage 2 -  Partial thickness loss of dermis presenting as a shallow open injury with a red, pink wound bed without slough. 7.5x5 pink/red open area in center (Active)  04/02/24 0848  Location: Sacrum  Location Orientation: Bilateral  Staging: Stage 2 -  Partial thickness loss of dermis presenting as a shallow open injury with a red, pink wound bed without slough.  Wound Description (Comments): 7.5x5 pink/red open area in center  Present on Admission: Yes  Dressing Type Foam - Lift dressing to assess site every shift 04/17/24 0638     Estimated body  mass index is 17.01 kg/m as calculated from the  following:   Height as of this encounter: 5' (1.524 m).   Weight as of this encounter: 39.5 kg.     Code Status: Full  Family Communication: Discussed extensively with daughter and caregiver at bedside  Disposition Plan: Status is: Inpatient Remains inpatient appropriate because: Level of care      Consultants: Neurology  Procedures: None  Antimicrobials: Completed IV Levaquin   DVT prophylaxis: Lovenox    Objective: Vitals:   04/16/24 2104 04/17/24 0004 04/17/24 0448 04/17/24 0755  BP: 114/61 118/63 (!) 157/53 (!) 150/57  Pulse: 76 81 64 74  Resp: 18 18 18 17   Temp: 97.9 F (36.6 C) 98.5 F (36.9 C) 97.7 F (36.5 C)   TempSrc: Oral Oral Oral   SpO2: 100% 93% 92% 100%  Weight:      Height:        Intake/Output Summary (Last 24 hours) at 04/17/2024 1604 Last data filed at 04/16/2024 1652 Gross per 24 hour  Intake 42.72 ml  Output --  Net 42.72 ml   Filed Weights   04/15/24 1531  Weight: 39.5 kg    Exam: General: NAD, deconditoned  Cardiovascular: S1, S2 present Respiratory: CTAB Abdomen: Soft, nontender, nondistended, bowel sounds present Musculoskeletal: No bilateral pedal edema noted Skin: Normal Psychiatry: Fair mood     Data Reviewed: CBC: Recent Labs  Lab 04/15/24 1526 04/15/24 1804 04/16/24 0647 04/17/24 1029  WBC 10.0  --  8.2 5.6  NEUTROABS 7.8*  --   --  3.6  HGB 9.7* 8.8* 11.1* 10.7*  HCT 29.4* 26.0* 34.5* 31.7*  MCV 86.5  --  88.5 84.8  PLT 256  --  258 253   Basic Metabolic Panel: Recent Labs  Lab 04/15/24 1526 04/15/24 1804 04/16/24 0647 04/17/24 1029  NA 132* 128* 132* 136  K 4.0 4.0 4.7 3.8  CL 99 91* 99 100  CO2 26  --  24 27  GLUCOSE 234* 179* 226* 98  BUN 26* 28* 19 16  CREATININE 0.79 0.80 0.62 0.57  CALCIUM 7.8*  --  8.3* 8.3*  MG 1.9  --  2.1  --   PHOS  --   --  3.4  --    GFR: Estimated Creatinine Clearance: 34.4 mL/min (by C-G formula based on SCr of 0.57 mg/dL). Liver Function Tests: Recent  Labs  Lab 04/15/24 1526  AST 19  ALT 23  ALKPHOS 66  BILITOT 0.4  PROT 5.6*  ALBUMIN 2.1*   No results for input(s): "LIPASE", "AMYLASE" in the last 168 hours. No results for input(s): "AMMONIA" in the last 168 hours. Coagulation Profile: Recent Labs  Lab 04/15/24 1726  INR 1.4*   Cardiac Enzymes: No results for input(s): "CKTOTAL", "CKMB", "CKMBINDEX", "TROPONINI" in the last 168 hours. BNP (last 3 results) No results for input(s): "PROBNP" in the last 8760 hours. HbA1C: No results for input(s): "HGBA1C" in the last 72 hours. CBG: Recent Labs  Lab 04/17/24 0006 04/17/24 0239 04/17/24 0450 04/17/24 0854 04/17/24 1210  GLUCAP 399* 180* 119* 117* 180*   Lipid Profile: No results for input(s): "CHOL", "HDL", "LDLCALC", "TRIG", "CHOLHDL", "LDLDIRECT" in the last 72 hours. Thyroid  Function Tests: Recent Labs    04/16/24 0647  TSH 2.121   Anemia Panel: No results for input(s): "VITAMINB12", "FOLATE", "FERRITIN", "TIBC", "IRON", "RETICCTPCT" in the last 72 hours. Urine analysis:    Component Value Date/Time   COLORURINE YELLOW 02/02/2023 0950   APPEARANCEUR CLEAR 02/02/2023 0950  LABSPEC 1.021 02/02/2023 0950   PHURINE 5.0 02/02/2023 0950   GLUCOSEU >=500 (A) 02/02/2023 0950   HGBUR NEGATIVE 02/02/2023 0950   BILIRUBINUR NEGATIVE 02/02/2023 0950   KETONESUR 5 (A) 02/02/2023 0950   PROTEINUR NEGATIVE 02/02/2023 0950   UROBILINOGEN 0.2 10/07/2014 1808   NITRITE NEGATIVE 02/02/2023 0950   LEUKOCYTESUR NEGATIVE 02/02/2023 0950   Sepsis Labs: @LABRCNTIP (procalcitonin:4,lacticidven:4)  ) Recent Results (from the past 240 hours)  Resp panel by RT-PCR (RSV, Flu A&B, Covid) Anterior Nasal Swab     Status: None   Collection Time: 04/15/24  3:57 PM   Specimen: Anterior Nasal Swab  Result Value Ref Range Status   SARS Coronavirus 2 by RT PCR NEGATIVE NEGATIVE Final   Influenza A by PCR NEGATIVE NEGATIVE Final   Influenza B by PCR NEGATIVE NEGATIVE Final     Comment: (NOTE) The Xpert Xpress SARS-CoV-2/FLU/RSV plus assay is intended as an aid in the diagnosis of influenza from Nasopharyngeal swab specimens and should not be used as a sole basis for treatment. Nasal washings and aspirates are unacceptable for Xpert Xpress SARS-CoV-2/FLU/RSV testing.  Fact Sheet for Patients: BloggerCourse.com  Fact Sheet for Healthcare Providers: SeriousBroker.it  This test is not yet approved or cleared by the [place]  FDA and has been authorized for detection and/or diagnosis of SARS-CoV-2 by FDA under an Emergency Use Authorization (EUA). This EUA will remain in effect (meaning this test can be used) for the duration of the COVID-19 declaration under Section 564(b)(1) of the Act, 21 U.S.C. section 360bbb-3(b)(1), unless the authorization is terminated or revoked.     Resp Syncytial Virus by PCR NEGATIVE NEGATIVE Final    Comment: (NOTE) Fact Sheet for Patients: BloggerCourse.com  Fact Sheet for Healthcare Providers: SeriousBroker.it  This test is not yet approved or cleared by the [place]  FDA and has been authorized for detection and/or diagnosis of SARS-CoV-2 by FDA under an Emergency Use Authorization (EUA). This EUA will remain in effect (meaning this test can be used) for the duration of the COVID-19 declaration under Section 564(b)(1) of the Act, 21 U.S.C. section 360bbb-3(b)(1), unless the authorization is terminated or revoked.  Performed at [facility], [address], [place], [place] [address]       Studies: No results found.   Scheduled Meds:  aspirin  EC  81 mg Oral Daily   enoxaparin  (LOVENOX ) injection  30 mg Subcutaneous Q24H   feeding supplement (GLUCERNA SHAKE)  237 mL Oral TID BM   insulin  aspart  0-9 Units Subcutaneous TID PC,HS,0200   insulin  aspart  2 Units Subcutaneous TID WC   insulin   glargine-yfgn  5 Units Subcutaneous BID   levETIRAcetam  250 mg Oral BID   megestrol   20 mg Oral BID   metoprolol  tartrate  25 mg Oral Daily   Pro-Stat AWC  30 mL Oral Daily   simvastatin   40 mg Oral QHS    Continuous Infusions:     LOS: 2 days     Nicholis Stepanek J Eliab Closson, MD [facility]  If 7PM-7AM, please contact night-coverage [email or web address] 04/17/2024, 4:04 PM

## 2024-04-17 NOTE — Progress Notes (Signed)
Pt refused morning meds.

## 2024-04-17 NOTE — NC FL2 (Signed)
 [place]  MEDICAID FL2 LEVEL OF CARE FORM     IDENTIFICATION  Patient Name: Danielle Clarke Birthdate: 1941-01-29 Sex: female Admission Date (Current Location): 04/15/2024  [facility] and [place] Number:  Producer, television/film/video and Address:  [facility], [address] [place], [place] [address]      Provider Number: [id number]  Attending Physician Name and Address:  Veronica Gordon, MD  Relative Name and Phone Number:  Lane, Kjos (Daughter)  [phone]    Current Level of Care: Hospital Recommended Level of Care: Skilled Nursing Facility Prior Approval Number:    Date Approved/Denied:   PASRR Number: [id number]  Discharge Plan: SNF    Current Diagnoses: Patient Active Problem List   Diagnosis Date Noted   Pneumonia of right lower lobe due to infectious organism 04/15/2024   Protein-calorie malnutrition, severe 04/03/2024   Choking 04/01/2024   Acute hypoxic respiratory failure (HCC) 04/01/2024   Compression fracture of T12 vertebra (HCC) 04/01/2024   Shingles 04/01/2024   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   Closed comminuted left humeral fracture 11/19/2021   Prolonged QT interval 11/19/2021   Elevated CK 11/19/2021   CKD (chronic kidney disease), stage III (HCC) 10/14/2020   Abnormality of gait 12/01/2019   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) 12/01/2019   Traumatic brain injury with loss of consciousness (HCC) 05/28/2019   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 05/20/2019   Hypertensive crisis    Hypokalemia    Leukocytosis    Lethargy    Labile blood pressure    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    Acute blood loss anemia    Low grade fever    Hypoglycemia    Multiple closed fractures of ribs of right side    Pleural effusion on right    Hypoalbuminemia due to protein-calorie malnutrition (HCC)    Dysphagia    Traumatic  cerebral intraparenchymal hematoma (HCC) 02/14/2019   Pacemaker    Labile blood glucose    Diabetes mellitus type 2 in nonobese (HCC)    Dyslipidemia    Essential hypertension    Hypernatremia    Anemia of chronic disease    Hypertensive urgency 02/07/2019   Intracerebral hemorrhage (HCC) 02/07/2019   Intracranial bleeding (HCC) 02/06/2019   Elevated troponin    Abnormal EKG    Bronchitis 08/05/2017   Bilateral carotid artery stenosis    Aortic stenosis 08/25/2015   Cervical stenosis of spinal canal 07/31/2014   Essential hypertension, benign 12/30/2013   Hyperlipidemia 12/30/2013   Toe infection 12/30/2013   Acute renal failure (HCC) 12/29/2013   Loss of consciousness (HCC) 01/02/2013   Diabetes mellitus type 1 (HCC) 01/02/2013   Bilateral carotid artery disease (HCC) 01/02/2013   GOITER, MULTINODULAR 02/11/2008    Orientation RESPIRATION BLADDER Height & Weight     Self, Time, Situation, Place  Normal Incontinent Weight: 87 lb 1.3 oz (39.5 kg) Height:  5' (152.4 cm)  BEHAVIORAL SYMPTOMS/MOOD NEUROLOGICAL BOWEL NUTRITION STATUS      Continent Diet (see DC summary)  AMBULATORY STATUS COMMUNICATION OF NEEDS Skin   Extensive Assist Verbally Other (Comment), PU Stage and Appropriate Care (Back  Wound / Incision (Open or Dehisced) 02/07/19 Other (Comment) Head Posterior. Sacrum Bilateral Stage 2 - Partial thickness loss a shallow open injury with a red, pink wound bed without slough. 7.5x5 pink/red open area in center)   PU Stage 2 Dressing: Daily  Personal Care Assistance Level of Assistance  Bathing, Feeding, Dressing Bathing Assistance: Maximum assistance Feeding assistance: Limited assistance Dressing Assistance: Maximum assistance     Functional Limitations Info  Sight, Hearing, Speech Sight Info: Adequate Hearing Info: Adequate Speech Info: Adequate    SPECIAL CARE FACTORS FREQUENCY  PT (By licensed PT), OT (By licensed OT)     PT  Frequency: 5x/week OT Frequency: 5x/week            Contractures Contractures Info: Not present    Additional Factors Info  Code Status, Allergies Code Status Info: FULL Allergies Info: Augmentin (Amoxicillin-pot Clavulanate)  Ceclor (Cefaclor)  Firvanq (Vancomycin)  Sumycin (Tetracycline)  Accupril  (Quinapril  Hcl)  Bactrim  (Sulfamethoxazole -trimethoprim )  Fosamax (Alendronate Sodium)  Nsaids  Deltasone (Prednisone)   Insulin  Sliding Scale Info: see DC summary       Current Medications (04/17/2024):  This is the current hospital active medication list Current Facility-Administered Medications  Medication Dose Route Frequency Provider Last Rate Last Admin   (feeding supplement) PROSource Plus liquid 30 mL  30 mL Oral Daily Ezenduka, Nkeiruka J, MD       acetaminophen  (TYLENOL ) tablet 650 mg  650 mg Oral Q6H PRN Hall, Carole N, DO   650 mg at 04/16/24 1749   aspirin  EC tablet 81 mg  81 mg Oral Daily Bary Boss, DO   81 mg at 04/16/24 1749   enoxaparin  (LOVENOX ) injection 30 mg  30 mg Subcutaneous Q24H Reesa Cannon N, DO   30 mg at 04/16/24 2312   feeding supplement (GLUCERNA SHAKE) (GLUCERNA SHAKE) liquid 237 mL  237 mL Oral TID BM Reesa Cannon N, DO   237 mL at 04/17/24 1610   insulin  aspart (novoLOG ) injection 0-9 Units  0-9 Units Subcutaneous TID PC,HS,0200 Sundil, Subrina, MD   2 Units at 04/17/24 1232   insulin  aspart (novoLOG ) injection 2 Units  2 Units Subcutaneous TID WC Ezenduka, Nkeiruka J, MD       insulin  glargine-yfgn (SEMGLEE ) injection 5 Units  5 Units Subcutaneous BID Bary Boss, DO   5 Units at 04/17/24 9604   levETIRAcetam (KEPPRA) tablet 250 mg  250 mg Oral BID Bhagat, Srishti L, MD   250 mg at 04/16/24 2313   megestrol  (MEGACE ) tablet 20 mg  20 mg Oral BID Ezenduka, Nkeiruka J, MD       melatonin tablet 5 mg  5 mg Oral QHS PRN Reesa Cannon N, DO   5 mg at 04/15/24 2120   metoprolol  tartrate (LOPRESSOR ) tablet 25 mg  25 mg Oral Daily Ezenduka, Nkeiruka J, MD        polyethylene glycol (MIRALAX  / GLYCOLAX ) packet 17 g  17 g Oral Daily PRN Reesa Cannon N, DO       prochlorperazine  (COMPAZINE ) injection 5 mg  5 mg Intravenous Q6H PRN Hall, Carole N, DO       simvastatin  (ZOCOR ) tablet 40 mg  40 mg Oral QHS Hall, Carole N, DO   40 mg at 04/16/24 2313     Discharge Medications: Please see discharge summary for a list of discharge medications.  Relevant Imaging Results:  Relevant Lab Results:   Additional Information SSN: [id number]  Tylique Aull A [place], LCSW

## 2024-04-18 DIAGNOSIS — J189 Pneumonia, unspecified organism: Secondary | ICD-10-CM | POA: Diagnosis not present

## 2024-04-18 LAB — GLUCOSE, CAPILLARY
Glucose-Capillary: 105 mg/dL — ABNORMAL HIGH (ref 70–99)
Glucose-Capillary: 144 mg/dL — ABNORMAL HIGH (ref 70–99)
Glucose-Capillary: 236 mg/dL — ABNORMAL HIGH (ref 70–99)
Glucose-Capillary: 238 mg/dL — ABNORMAL HIGH (ref 70–99)
Glucose-Capillary: 303 mg/dL — ABNORMAL HIGH (ref 70–99)
Glucose-Capillary: 371 mg/dL — ABNORMAL HIGH (ref 70–99)

## 2024-04-18 MED ORDER — ASPIRIN 81 MG PO CHEW
81.0000 mg | CHEWABLE_TABLET | Freq: Every day | ORAL | Status: DC
  Administered 2024-04-18 – 2024-04-25 (×7): 81 mg via ORAL
  Filled 2024-04-18 (×8): qty 1

## 2024-04-18 MED ORDER — LEVOFLOXACIN 750 MG PO TABS
750.0000 mg | ORAL_TABLET | ORAL | Status: AC
  Administered 2024-04-18: 750 mg via ORAL
  Filled 2024-04-18 (×3): qty 1

## 2024-04-18 NOTE — Plan of Care (Signed)

## 2024-04-18 NOTE — Hospital Course (Signed)
 Danielle Clarke is a 83 y.o. 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  Patient presented secondary to chest pain and shortness of breath and was found to have evidence of pneumonia with associated acute hypoxia requiring supplemental oxygen.  Patient was started on antibiotics for management.  Chest pain workup was negative for cardiac etiology.

## 2024-04-18 NOTE — TOC Progression Note (Signed)
 Transition of Care [facility]) - Progression Note    Patient Details  Name: Danielle Clarke MRN: [id number] Date of Birth: January 27, 1941  Transition of Care [facility] CM/SW Contact  Briony Parveen A [place], LCSW Phone Number: 04/18/2024, 4:30 PM  Clinical Narrative:     CSW contacted pt's daughter to discuss disposition plan for home versus SNF at discharge. Unable to be reached, left vm.   Notified by PT that pt does not seem interested in PT at this time. CSW to start authorization once choice is confirmed.   RNCM notified of possible plan for home if able.    TOC will continue to follow.   Expected Discharge Plan: Skilled Nursing Facility    Expected Discharge Plan and Services                                               Social Determinants of Health (SDOH) Interventions SDOH Screenings   Food Insecurity: No Food Insecurity (04/16/2024)  Housing: Low Risk  (04/16/2024)  Transportation Needs: No Transportation Needs (04/16/2024)  Utilities: Not At Risk (04/16/2024)  Depression (PHQ2-9): Low Risk  (05/28/2019)  Social Connections: Unknown (04/16/2024)  Tobacco Use: Low Risk  (04/16/2024)    Readmission Risk Interventions    04/02/2024   11:26 AM 02/07/2023   12:07 PM  Readmission Risk Prevention Plan  Post Dischage Appt  Complete  Medication Screening  Complete  Transportation Screening Complete Complete  PCP or Specialist Appt within 5-7 Days Complete   Home Care Screening Complete   Medication Review (RN CM) Referral to Pharmacy

## 2024-04-18 NOTE — Progress Notes (Signed)
 OT Cancellation Note  Patient Details Name: Danielle Clarke MRN: [id number] DOB: 1941/04/23   Cancelled Treatment:    Reason Eval/Treat Not Completed: (P) Fatigue/lethargy limiting ability to participate, Attempted multiple times to wake up Pt with nursing in room, Pt opened her eyes but resting heavily in bed. Per RN Pt was alert and eating breakfast this morning, will return later as able.   Scherry Curtis 04/18/2024, 12:28 PM

## 2024-04-18 NOTE — Evaluation (Signed)
 Occupational Therapy Evaluation Patient Details Name: Danielle Clarke MRN: [id number] DOB: 02-22-1941 Today's Date: 04/18/2024   History of Present Illness   Pt is 83 yo female who presents 04/15/24 who presents to the ER via EMS from home after being discharged from SNF rehab 2 days ago with c/o chest pain and SOB. Pt recently admitted  04/01/24-04/04/24 for syncope, T12 compression fx, and dysphagia. In ED pt noted to become aphasic and blank stare, code stroke called. Chest CT negative for PE. MRI of brain pending. PMH significant for dysphagia, CAD, TAVR, bradycardia with PPM placement, HTN, HLD, DM1, ICH.     Clinical Impressions Pt c/o significant fatigue, was lethargic earlier, decreased motivation for mobility. Pt refused OT/PT mobility last two days as well. Pt lives alone, has PCA 5X/wk for 4 or more hours a day to help as needed. Pt with recent admission and has needed increasingly more help recently. Pt required assistance for feeding today. Pt BP 105/42 in supine. Pt educated on importance of OOB activities and sitting in recliner, Pt adamantly refused several times and asked OT to leave. Pt would benefit from postacute rehab <3hrs/day to maximize activity tolerance and strength, will continue to follow acutely to progress as able. If Pt continues to decline and refuse OOB activities, palliative consult may be needed as well as prevalon boots and air mattress.      If plan is discharge home, recommend the following:   A lot of help with bathing/dressing/bathroom;Two people to help with walking and/or transfers;Assistance with cooking/housework;Help with stairs or ramp for entrance;Assist for transportation     Functional Status Assessment   Patient has had a recent decline in their functional status and/or demonstrates limited ability to make significant improvements in function in a reasonable and predictable amount of time     Equipment Recommendations   Other (comment)  (defer)     Recommendations for Other Services         Precautions/Restrictions   Precautions Precautions: Fall;Other (comment) Recall of Precautions/Restrictions: Impaired Precaution/Restrictions Comments: back, has TLSO brace, family cannot find brace Required Braces or Orthoses: Spinal Brace Spinal Brace: Thoracolumbosacral orthotic Restrictions Weight Bearing Restrictions Per Provider Order: No     Mobility Bed Mobility Overal bed mobility: Needs Assistance             General bed mobility comments: refused    Transfers                   General transfer comment: refused      Balance                                           ADL either performed or assessed with clinical judgement   ADL Overall ADL's : Needs assistance/impaired Eating/Feeding: Moderate assistance;Bed level   Grooming: Moderate assistance;Bed level   Upper Body Bathing: Maximal assistance;Bed level       Upper Body Dressing : Maximal assistance;Bed level                     General ADL Comments: Pt has refused OOB activities for last few days. Can move BUEs if needed but due to fatigue and decreased motivation Pt requires help with feeding, UB dressing/bathing. Pt refuses to perform bed mobility or BLE movements.     Vision         Perception  Praxis         Pertinent Vitals/Pain Pain Assessment Pain Assessment: Faces Faces Pain Scale: Hurts little more Pain Location: back Pain Descriptors / Indicators: Discomfort Pain Intervention(s): Monitored during session     Extremity/Trunk Assessment Upper Extremity Assessment Upper Extremity Assessment: Generalized weakness   Lower Extremity Assessment Lower Extremity Assessment: Defer to PT evaluation       Communication Communication Communication: No apparent difficulties   Cognition Arousal: Lethargic Behavior During Therapy: Flat affect Cognition: No apparent  impairments                               Following commands: Intact       Cueing  General Comments   Cueing Techniques: Verbal cues      Exercises     Shoulder Instructions      Home Living Family/patient expects to be discharged to:: Private residence Living Arrangements: Alone Available Help at Discharge: Family;Personal care attendant Type of Home: House Home Access: Stairs to enter [employer] of Steps: 2 Entrance Stairs-Rails: Left Home Layout: One level     Bathroom Shower/Tub: Chief Strategy Officer: Handicapped height     Home Equipment: Shower seat;Grab bars - tub/shower;Rolling Environmental consultant (2 wheels);Cane - single point;Wheelchair - manual;BSC/3in1   Additional Comments: daughter lives next door, is a Financial controller so at home intermittently. Pt takes care of her cat. Daughter in law assists sometimes, PCA assists 4-5X as needed      Prior Functioning/Environment               Mobility Comments: 1-2 weeks since she could walk because of pain. Prior likes to go shopping with her aide, used a cane ADLs Comments: Ind with ADLs/selfcare, light meal prep, daughter assists with showers, used a cane but has been1-2 weeks since she could walk because of pain. Prior likes to go shopping with her aide    OT Problem List: Decreased strength;Impaired balance (sitting and/or standing);Pain;Decreased activity tolerance;Decreased knowledge of use of DME or AE   OT Treatment/Interventions: Self-care/ADL training;Therapeutic exercise;Patient/family education;Therapeutic activities;Balance training;DME and/or AE instruction      OT Goals(Current goals can be found in the care plan section)   Acute Rehab OT Goals Patient Stated Goal: Pt refusing mobility and not able to particpiate in planning OT Goal Formulation: Patient unable to participate in goal setting Time For Goal Achievement: 05/03/24 Potential to Achieve Goals: Fair    OT Frequency:  Min 2X/week    Co-evaluation              AM-PAC OT "6 Clicks" Daily Activity     Outcome Measure Help from another person eating meals?: A Little Help from another person taking care of personal grooming?: A Little Help from another person toileting, which includes using toliet, bedpan, or urinal?: Total Help from another person bathing (including washing, rinsing, drying)?: A Lot Help from another person to put on and taking off regular upper body clothing?: A Lot Help from another person to put on and taking off regular lower body clothing?: Total 6 Click Score: 12   End of Session Nurse Communication: Mobility status  Activity Tolerance: Patient limited by lethargy;Patient limited by fatigue Patient left: in bed;with call bell/phone within reach;with bed alarm set;with family/visitor present  OT Visit Diagnosis: Other abnormalities of gait and mobility (R26.89);History of falling (Z91.81);Pain;Muscle weakness (generalized) (M62.81)  Time: 6578-4696 OT Time Calculation (min): 23 min Charges:  OT General Charges $OT Visit: 1 Visit OT Evaluation $OT Eval Moderate Complexity: 1 Mod OT Treatments $Therapeutic Activity: 8-22 mins  [address], OTR/L   Scherry Curtis 04/18/2024, 2:03 PM

## 2024-04-18 NOTE — Progress Notes (Signed)
 PROGRESS NOTE    Danielle PONTIUS  Clarke:[id number] DOB: 01/02/1941 DOA: 04/15/2024 PCP: Elester Grim, MD   Brief Narrative: Danielle Clarke is a 83 y.o. 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  Patient presented secondary to chest pain and shortness of breath and was found to have evidence of pneumonia with associated acute hypoxia requiring supplemental oxygen.  Patient was started on antibiotics for management.  Chest pain workup was negative for cardiac etiology.   Assessment and Plan:  Right lower lobe pneumonia Present on admission.  CTA chest imaging consistent with aspiration pneumonia picture.  Patient was started on aztreonam  and transition to Levaquin . - Continue Levaquin  to complete a 7-day course of antibiotics  Chest tightness Present on admission.  Initial troponin of 14 with delta of 9, not consistent with ACS.  Likely related to underlying pneumonia.  Improved.  Acute respiratory failure with hypoxia Secondary to pneumonia. Patient using up to 3 L/min -Wean oxygen to room air -Ambulatory pulse ox  Aphasia Appears to have been a transient episode.  Initially, code stroke was ordered.  Neurology consulted.  CT and MRI imaging without evidence of acute stroke, however MRI was significant for left frontal and bilateral anterior temporal encephalomalacia including cortex  EEG was recommended, however this was declined by the patient.  Symptoms resolved.  Diabetes mellitus type 2 Poorly controlled based off last hemoglobin A1c of 9.7% from Apr 01, 2024.  Patient is managed on Lantus  and Humalog as an outpatient.  Regimen resumed on admission. - Continue Semglee  5 units twice daily, NovoLog  3 units 3 times daily with meals, SSI  Dysphagia Chronic. - Continue dysphagia 2 diet  Hyperlipidemia Aortic atherosclerosis -Continue simvastatin   Chronic  HFpEF Stable.  Abnormal CT chest finding CT chest significant for a 1.7 x 0.9 cm ovoid density within the right breast with recommendation for correlation with dedicated breast imaging.  Patient will need to follow-up outpatient for this.  Enlarged thyroid  gland Noted on thyroid  ultrasound on 5/21. TSH normal. Small 1.5 cmTI-RADS category 3 nodule noted in the right gland, meeting criteria for image surveillance. Recommendation for follow-up ultrasound in 1 year.   DVT prophylaxis: Lovenox  Code Status:   Code Status: Full Code Family Communication: None at bedside Disposition Plan: Discharge to SNF once bed is available   Consultants:  Neurology  Procedures:  None  Antimicrobials: Levaquin  Aztreonam     Subjective: Patient reports no chest pain or dyspnea this morning.  Objective: BP (!) 152/42 (BP Location: Left Arm)   Pulse 73   Temp 98.5 F (36.9 C) (Oral)   Resp 18   Ht 5' (1.524 m)   Wt 39.5 kg   SpO2 97%   BMI 17.01 kg/m   Examination:  General exam: Appears calm and comfortable.  Frail appearing. Respiratory system: Right upper lobe rales. Respiratory effort normal. Cardiovascular system: S1 & S2 heard, RRR. No murmurs, rubs, gallops or clicks. Gastrointestinal system: Abdomen is nondistended, soft and nontender. Normal bowel sounds heard. Central nervous system: Alert and oriented. No focal neurological deficits. Musculoskeletal: No edema. No calf tenderness Psychiatry: Judgement and insight appear normal. Mood & affect appropriate.    Data Reviewed: I have personally reviewed following labs and imaging studies  CBC Lab Results  Component Value Date   WBC 5.6 04/17/2024   RBC 3.74 (L) 04/17/2024   HGB 10.7 (L) 04/17/2024   HCT 31.7 (L) 04/17/2024  MCV 84.8 04/17/2024   MCH 28.6 04/17/2024   PLT 253 04/17/2024   MCHC 33.8 04/17/2024   RDW 16.0 (H) 04/17/2024   LYMPHSABS 1.4 04/17/2024   MONOABS 0.4 04/17/2024   EOSABS 0.1 04/17/2024    BASOSABS 0.1 04/17/2024     Last metabolic panel Lab Results  Component Value Date   NA 136 04/17/2024   K 3.8 04/17/2024   CL 100 04/17/2024   CO2 27 04/17/2024   BUN 16 04/17/2024   CREATININE 0.57 04/17/2024   GLUCOSE 98 04/17/2024   GFRNONAA >60 04/17/2024   GFRAA >60 02/25/2019   CALCIUM 8.3 (L) 04/17/2024   PHOS 3.4 04/16/2024   PROT 5.6 (L) 04/15/2024   ALBUMIN 2.1 (L) 04/15/2024   LABGLOB 2.9 10/23/2023   AGRATIO 1.7 04/19/2023   BILITOT 0.4 04/15/2024   ALKPHOS 66 04/15/2024   AST 19 04/15/2024   ALT 23 04/15/2024   ANIONGAP 9 04/17/2024    GFR: Estimated Creatinine Clearance: 34.4 mL/min (by C-G formula based on SCr of 0.57 mg/dL).  Recent Results (from the past 240 hours)  Resp panel by RT-PCR (RSV, Flu A&B, Covid) Anterior Nasal Swab     Status: None   Collection Time: 04/15/24  3:57 PM   Specimen: Anterior Nasal Swab  Result Value Ref Range Status   SARS Coronavirus 2 by RT PCR NEGATIVE NEGATIVE Final   Influenza A by PCR NEGATIVE NEGATIVE Final   Influenza B by PCR NEGATIVE NEGATIVE Final    Comment: (NOTE) The Xpert Xpress SARS-CoV-2/FLU/RSV plus assay is intended as an aid in the diagnosis of influenza from Nasopharyngeal swab specimens and should not be used as a sole basis for treatment. Nasal washings and aspirates are unacceptable for Xpert Xpress SARS-CoV-2/FLU/RSV testing.  Fact Sheet for Patients: BloggerCourse.com  Fact Sheet for Healthcare Providers: SeriousBroker.it  This test is not yet approved or cleared by the [place]  FDA and has been authorized for detection and/or diagnosis of SARS-CoV-2 by FDA under an Emergency Use Authorization (EUA). This EUA will remain in effect (meaning this test can be used) for the duration of the COVID-19 declaration under Section 564(b)(1) of the Act, 21 U.S.C. section 360bbb-3(b)(1), unless the authorization is terminated or revoked.      Resp Syncytial Virus by PCR NEGATIVE NEGATIVE Final    Comment: (NOTE) Fact Sheet for Patients: BloggerCourse.com  Fact Sheet for Healthcare Providers: SeriousBroker.it  This test is not yet approved or cleared by the [place]  FDA and has been authorized for detection and/or diagnosis of SARS-CoV-2 by FDA under an Emergency Use Authorization (EUA). This EUA will remain in effect (meaning this test can be used) for the duration of the COVID-19 declaration under Section 564(b)(1) of the Act, 21 U.S.C. section 360bbb-3(b)(1), unless the authorization is terminated or revoked.  Performed at [facility], [address] [place], [place] [address]       Radiology Studies: DG Swallowing Func-Speech Pathology Result Date: 04/16/2024 Table formatting from the original result was not included. Modified Barium Swallow Study Patient Details Name: BRAIDYN PEACE MRN: [id number] Date of Birth: 14-Jul-1941 Today's Date: 04/16/2024 HPI/PMH: HPI: HORTENSIA DUFFIN is a 82 y.o. female who presented to the ER via EMS from home after being discharged from SNF rehab 2 days ago with complaints of chest pain and shortness of breath. Chest CT 5/20: "Bilateral lower lobe tree-in-bud nodules,  right-greater-than-left, and subpleural right lower lobe  consolidation, likely aspiration with developing right lower lobe  pneumonia."  Pt with medical history significant for type 1 diabetes, hyperlipidemia, aortic stenosis status post TAVR in 2024, dysphagia (recently started on dysphagia 1 diet), recently admitted for syncope (04/01/2024-04/04/2024) when she was found to have T12 compression fracture. Hx of anterior cervical corpectomy C4-6 with fixation screws at C3 and C7 07/31/14. Has had two prior MBS studies, 02/11/19 and 04/07/22. Most recent study noted esophageal stasis, retention in pharynx, inability to pass pill through PES.  Esophagram 04/06/2024 showed mild  esophageal dysmotility; review of imaging shows practical implications of dysmotility are likely significant. Clinical Impression: Clinical Impression: Pt presents with a mild pharyngeal dysphagia with a DIGEST score of 2.  Oral phase is generally functional with mildly prolonged oral preparation of solids. Most notable is cervical hardware from 2015 surgery that impacts full inversion of epiglottis over the larynx and leads to retention of solid foods between epiglottis and base of tongue.  There is a narrowing of the pharyngeal space and PES opening. Barium was observed to be retained within the esophagus and occasionally backflowed through the PES and sat in the pyrforms.  There was effort on pt's part to transfer solids through the pharynx, occasionally bringing material back into oral cavity to propel it again. Thin and nectars spilled into the larynx intermittently, but pt had sufficient squeeze between arytenoids and base of epiglottis to protect the airway. There was no aspiration on today's study.  Aspiration, if it is occurring, is likely related to backflow and retention of material in esophagus.  Notes indicate pt has been on pureed diets/thin liquids - SLP will follow to determine if diet can be advanced.  Pills must be crushed - 2023 study revealed 13 mm barium pill lodged at the the entrance of the esophagus and was expectorated. DIGEST Swallow Severity Rating*  Safety: 1  Efficiency: 2  Overall Pharyngeal Swallow Severity: 2 1: mild; 2: moderate; 3: severe; 4: profound *The Dynamic Imaging Grade of Swallowing Toxicity is standardized for the head and neck cancer population, however, demonstrates promising clinical applications across populations to standardize the clinical rating of pharyngeal swallow safety and severity. Factors that may increase risk of adverse event in presence of aspiration Roderick Civatte & Jessy Morocco 2021): Factors that may increase risk of adverse event in presence of aspiration Roderick Civatte  & Jessy Morocco 2021): Frail or deconditioned; Respiratory or GI disease; Limited mobility; Weak cough Recommendations/Plan: Swallowing Evaluation Recommendations Swallowing Evaluation Recommendations Recommendations: PO diet PO Diet Recommendation: Dysphagia 1 (Pureed); Thin liquids (Level 0) Liquid Administration via: Cup; Straw Medication Administration: Crushed with puree Supervision: Staff to assist with self-feeding Postural changes: Stay upright 30-60 min after meals Oral care recommendations: Oral care BID (2x/day) Treatment Plan Treatment Plan Treatment recommendations: Therapy as outlined in treatment plan below Follow-up recommendations: No SLP follow up Functional status assessment: Patient has had a recent decline in their functional status and demonstrates the ability to make significant improvements in function in a reasonable and predictable amount of time. Treatment frequency: Min 2x/week Treatment duration: 1 week Interventions: Aspiration precaution training; Patient/family education; Trials of upgraded texture/liquids Recommendations Recommendations for follow up therapy are one component of a multi-disciplinary discharge planning process, led by the attending physician.  Recommendations may be updated based on patient status, additional functional criteria and insurance authorization. Assessment: Orofacial Exam: Orofacial Exam Oral Cavity - Dentition: Adequate natural dentition Orofacial Anatomy: WFL Oral Motor/Sensory Function: WFL Anatomy: Anatomy: Presence of cervical hardware Boluses Administered: Boluses Administered Boluses Administered: Thin liquids (Level 0); Mildly thick liquids (Level 2, nectar  thick); Puree; Solid  Oral Impairment Domain: Oral Impairment Domain Lip Closure: No labial escape Tongue control during bolus hold: Cohesive bolus between tongue to palatal seal Bolus preparation/mastication: Slow prolonged chewing/mashing with complete recollection Bolus transport/lingual motion:  Brisk tongue motion Oral residue: Trace residue lining oral structures Initiation of pharyngeal swallow : Pyriform sinuses  Pharyngeal Impairment Domain: Pharyngeal Impairment Domain Soft palate elevation: No bolus between soft palate (SP)/pharyngeal wall (PW) Laryngeal elevation: Partial superior movement of thyroid  cartilage/partial approximation of arytenoids to epiglottic petiole Anterior hyoid excursion: Complete anterior movement Epiglottic movement: Partial inversion Laryngeal vestibule closure: Incomplete, narrow column air/contrast in laryngeal vestibule Pharyngeal stripping wave : Present - diminished Pharyngeal contraction (A/P view only): N/A Pharyngoesophageal segment opening: Partial distention/partial duration, partial obstruction of flow Tongue base retraction: Narrow column of contrast or air between tongue base and PPW Pharyngeal residue: Collection of residue within or on pharyngeal structures Location of pharyngeal residue: Valleculae; Pharyngeal wall  Esophageal Impairment Domain: Esophageal Impairment Domain Esophageal clearance upright position: Esophageal retention with retrograde flow through the PES Pill: Pill Consistency administered: -- (NT) Penetration/Aspiration Scale Score: Penetration/Aspiration Scale Score 1.  Material does not enter airway: Puree; Solid 2.  Material enters airway, remains ABOVE vocal cords then ejected out: Thin liquids (Level 0) 3.  Material enters airway, remains ABOVE vocal cords and not ejected out: Mildly thick liquids (Level 2, nectar thick) Compensatory Strategies: Compensatory Strategies Compensatory strategies: Yes Straw: Effective   General Information: Caregiver present: No  Diet Prior to this Study: Dysphagia 1 (pureed); Thin liquids (Level 0)   Temperature : Normal   No data recorded  Supplemental O2: Nasal cannula   History of Recent Intubation: No  Behavior/Cognition: Alert; Cooperative No data recorded Baseline vocal quality/speech: Normal No data  recorded No data recorded Exam Limitations: No limitations Goal Planning: Prognosis for improved oropharyngeal function: Guarded No data recorded No data recorded Patient/Family Stated Goal: not stated No data recorded Pain: Pain Assessment Pain Assessment: Faces Faces Pain Scale: 2 Pain Location: generalized with mobility Pain Descriptors / Indicators: Discomfort Pain Intervention(s): Limited activity within patient's tolerance; Monitored during session End of Session: Start Time:SLP Start Time (ACUTE ONLY): 1505 Stop Time: SLP Stop Time (ACUTE ONLY): 1530 Time Calculation:SLP Time Calculation (min) (ACUTE ONLY): 25 min Charges: SLP Evaluations $ SLP Speech Visit: 1 Visit SLP Evaluations $BSS Swallow: 1 Procedure $MBS Swallow: 1 Procedure $Swallowing Treatment: 1 Procedure SLP visit diagnosis: SLP Visit Diagnosis: Dysphagia, pharyngeal phase (R13.13) Past Medical History: Past Medical History: Diagnosis Date  Aortic stenosis, mild   Arthritis   Asthmatic bronchitis   with colds per patient  Carotid artery disease (HCC)   40-59% bilateral ICA stenosis  Cataract   Cutaneous abscess of right foot   Glaucoma   Heart murmur   Dr Felipe Horton is her  cardiologist.   Hyperlipemia   Hypertension   Dr. 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 Lorie Rook and Dr. Honey Lusty  Syncope   Type 1 diabetes mellitus [facility])  Past Surgical History: Past Surgical History: Procedure Laterality Date  ANKLE FRACTURE SURGERY Left 2001  steel plate and 3 screws   ANTERIOR CERVICAL CORPECTOMY N/A 07/31/2014  Procedure: Cervical Four to Cervical Six Corpectomy;  Surgeon: Adelbert Adler, MD;  Location: [facility];  Service: Neurosurgery;  Laterality: N/A;  C4 to C6 Corpectomy  BREAST SURGERY  1988  CATARACT EXTRACTION W/ INTRAOCULAR LENS  IMPLANT, BILATERAL Bilateral 2011  right and then left  EYE SURGERY    FRACTURE SURGERY     HAMMER TOE SURGERY  1998  I & D EXTREMITY Right 09/27/2018  Procedure: IRRIGATION AND DEBRIDEMENT RIGHT FOOT;  Surgeon: Timothy Ford, MD;  Location: [facility] OR;  Service: Orthopedics;  Laterality: Right;  INTRAOPERATIVE TRANSTHORACIC ECHOCARDIOGRAM N/A 02/06/2023  Procedure: INTRAOPERATIVE TRANSTHORACIC ECHOCARDIOGRAM;  Surgeon: Kyra Phy, MD;  Location: [facility] OR;  Service: Open Heart Surgery;  Laterality: N/A;  JOINT REPLACEMENT    PACEMAKER IMPLANT N/A 02/10/2019  Procedure: PACEMAKER IMPLANT;  Surgeon: Tammie Fall, MD;  Location: [facility];  Service: Cardiovascular;  Laterality: N/A;  RIGHT/LEFT HEART CATH AND CORONARY ANGIOGRAPHY N/A 01/11/2023  Procedure: RIGHT/LEFT HEART CATH AND CORONARY ANGIOGRAPHY;  Surgeon: Kyra Phy, MD;  Location: [facility];  Service: Cardiovascular;  Laterality: N/A;  TONSILLECTOMY AND ADENOIDECTOMY  1948  TOTAL SHOULDER REPLACEMENT  2010  right shoulder   TRANSCATHETER AORTIC VALVE REPLACEMENT, TRANSFEMORAL N/A 02/06/2023  Procedure: Transcatheter Aortic Valve Replacement, Transfemoral;  Surgeon: Thukkani, Arun K, MD;  Location: [facility] OR;  Service: Open Heart Surgery;  Laterality: N/A;  TUBAL LIGATION  1979  TYMPANOSTOMY TUBE PLACEMENT Bilateral  Amanda L. Beatris Lincoln, MA CCC/SLP Clinical Specialist - Acute Care SLP Acute Rehabilitation Services Office number [phone] Myna Asal Laurice 04/16/2024, 4:26 PM     LOS: 3 days    Aneita Keens, MD [facility] 04/18/2024, 10:56 AM   If 7PM-7AM, please contact night-coverage [email or web address]

## 2024-04-18 NOTE — Inpatient Diabetes Management (Signed)
 Inpatient Diabetes Program Recommendations  AACE/ADA: New Consensus Statement on Inpatient Glycemic Control (2015)  Target Ranges:  Prepandial:   less than 140 mg/dL      Peak postprandial:   less than 180 mg/dL (1-2 hours)      Critically ill patients:  140 - 180 mg/dL   Lab Results  Component Value Date   GLUCAP 144 (H) 04/18/2024   HGBA1C 9.7 (H) 04/01/2024    Review of Glycemic Control  Latest Reference Range & Units 04/17/24 08:54 04/17/24 12:10 04/17/24 17:00 04/17/24 22:18 04/18/24 00:39 04/18/24 04:27 04/18/24 08:33  Glucose-Capillary 70 - 99 mg/dL 098 (H) 119 (H) 147 (H) 357 (H) 236 (H) 105 (H) 144 (H)  (H): Data is abnormally high  Diabetes history: DM Outpatient Diabetes medications: Lantus  16 units every day, Humalog 0-20 units TID Current orders for Inpatient glycemic control: Semglee  5 units BID, Novolog  0-9 units TID, Noovlog 3 units TID  Inpatient Diabetes Program Recommendations:    Might consider increasing meal coverage:  Novolog  5 units TID with meals if she consumes at least 50% (postprandials elevated).  Thank you, Hays Lipschutz, MSN, CDCES Diabetes Coordinator Inpatient Diabetes Program [phone] (team pager from 8a-5p)

## 2024-04-18 NOTE — Progress Notes (Addendum)
 Physical Therapy Treatment Patient Details Name: Danielle Clarke MRN: [id number] DOB: 1941-05-16 Today's Date: 04/18/2024   History of Present Illness Pt is 83 yo female who presents 04/15/24 who presents to the ER via EMS from home after being discharged from SNF rehab 2 days ago with c/o chest pain and SOB. Pt recently admitted  04/01/24-04/04/24 for syncope, T12 compression fx, and dysphagia. In ED pt noted to become aphasic and blank stare, code stroke called. Chest CT negative for PE. Brain MRI shows "Mild T2 hyperintensity in the cerebral white matter and pons usually from chronic small vessel ischemia. Brain atrophy. No  acute infarct, acute hemorrhage, hydrocephalus, or collection." Pt refused recommended EEG. PMH significant for dysphagia, CAD, TAVR, bradycardia with PPM placement, HTN, HLD, DM1, ICH.    PT Comments  Pt received in supine, appears oriented but unwelcoming of therapist encouragement to participate in therapy session. Given that pt has been refusing repositioning or OOB mobility with therapy and nursing staff, MD and RN messaged that pt would benefit from air bed for pressure relief/to protect her skin and prevalon boots if pt able to tolerate. Pt and caregiver given instruction on benefits of repositioning in supine Q2H and risks of immobility. If pt continues to refuse repositioning or therapy interventions, pt may benefit from goals of care conversation or palliative involvement. Will reattempt 1-2 more sessions but pt if continues to refuse to participate in OOB mobility, may sign off acute PT.  Of note, pt BP 105/42 (62) and HR 70 bpm in supine.   If plan is discharge home, recommend the following: Two people to help with walking and/or transfers;A lot of help with bathing/dressing/bathroom;Assistance with feeding;Help with stairs or ramp for entrance;Assist for transportation   Can travel by private vehicle     No  Equipment Recommendations  None recommended by PT;Other  (comment) (consider hospital bed with air mattress and hoyer lift if pt DC home directly; may need PTAR transport home or WC van)    Recommendations for Other Services       Precautions / Restrictions Precautions Precautions: Fall;Other (comment) Recall of Precautions/Restrictions: Impaired Precaution/Restrictions Comments: back, has TLSO brace, family cannot find brace Required Braces or Orthoses: Spinal Brace Spinal Brace: Thoracolumbosacral orthotic Restrictions Weight Bearing Restrictions Per Provider Order: No     Mobility  Bed Mobility Overal bed mobility: Needs Assistance             General bed mobility comments: refused    Transfers                   General transfer comment: refused    Ambulation/Gait                   Stairs             Wheelchair Mobility     Tilt Bed    Modified Rankin (Stroke Patients Only)       Balance Overall balance assessment: History of Falls     Sitting balance - Comments: pt refused                                    Communication Communication Communication: No apparent difficulties  Cognition Arousal: Alert Behavior During Therapy: Flat affect   PT - Cognitive impairments: Difficult to assess, Safety/Judgement, Initiation Difficult to assess due to:  (pt often becomes agitated with questions)  PT - Cognition Comments: pt able to follow conversation, irritated with questions and limited willingness to participate. Daughter in law leaving at beginning of session and pt personal care attendant arriving toward end of session and reports she was acting similarly at skilled nursing facility in terms of not wanting to mobilize and refusing to get up OOB. Following commands: Intact (difficult to tell due to pt refusing most interventions)      Cueing Cueing Techniques: Verbal cues, Gestural cues  Exercises Other Exercises Other Exercises: IS brought  to room and pt shown via verbal demo and gestures how to breathe in through through "straw", but pt refusing to attempt; discussion on how this will help clear up her PNA more quickly but pt not receptive.    General Comments General comments (skin integrity, edema, etc.): Discussion with pt caregiver who arrived at end of session and pt about skin protection/rolling Q2H for pressure relief and that MD will order her an air bed to protect her skin since she is refusing to be repositioned or participate in therapies      Pertinent Vitals/Pain Pain Assessment Pain Assessment: PAINAD Breathing: normal Negative Vocalization: occasional moan/groan, low speech, negative/disapproving quality Facial Expression: sad, frightened, frown Body Language: relaxed Consolability: no need to console PAINAD Score: 2 Pain Location: back Pain Descriptors / Indicators: Discomfort, Guarding Pain Intervention(s): Limited activity within patient's tolerance, Monitored during session, Patient requesting pain meds-RN notified (pt refusing repositioning)    Home Living Family/patient expects to be discharged to:: Private residence Living Arrangements: Alone Available Help at Discharge: Family;Personal care attendant Type of Home: House Home Access: Stairs to enter Entrance Stairs-Rails: Left Entrance Stairs-Number of Steps: 2   Home Layout: One level Home Equipment: Shower seat;Grab bars - tub/shower;Rolling Walker (2 wheels);Cane - single point;Wheelchair - manual;BSC/3in1 Additional Comments: daughter lives next door, is a Financial controller so at home intermittently. Pt takes care of her cat. Daughter in law assists sometimes, PCA assists 4-5X as needed    Prior Function            PT Goals (current goals can now be found in the care plan section) Acute Rehab PT Goals Patient Stated Goal: get better and return home to cat PT Goal Formulation: With patient/family Time For Goal Achievement:  04/30/24 Potential to Achieve Goals: Poor Progress towards PT goals: Not progressing toward goals - comment    Frequency    Min 2X/week      PT Plan      Co-evaluation              AM-PAC PT "6 Clicks" Mobility   Outcome Measure  Help needed turning from your back to your side while in a flat bed without using bedrails?: Total Help needed moving from lying on your back to sitting on the side of a flat bed without using bedrails?: Total Help needed moving to and from a bed to a chair (including a wheelchair)?: Total Help needed standing up from a chair using your arms (e.g., wheelchair or bedside chair)?: Total Help needed to walk in hospital room?: Total Help needed climbing 3-5 steps with a railing? : Total 6 Click Score: 6    End of Session Equipment Utilized During Treatment: Oxygen Activity Tolerance: Treatment limited secondary to agitation;Other (comment) (lack of desire to participate in any self-care or mobility related activities.) Patient left: in bed;with call bell/phone within reach;with bed alarm set;with family/visitor present;Other (comment) (caregiver in room) Nurse Communication: Mobility status;Need for lift equipment;Precautions;Other (  comment) (recommend prevalon boots and air bed given pt immobility) PT Visit Diagnosis: Muscle weakness (generalized) (M62.81);Difficulty in walking, not elsewhere classified (R26.2);Pain Pain - part of body:  (back)     Time: 1344-1400 PT Time Calculation (min) (ACUTE ONLY): 16 min  Charges:    $Therapeutic Activity: 8-22 mins PT General Charges $$ ACUTE PT VISIT: 1 Visit                     Eleanna Theilen P., PTA Acute Rehabilitation Services Secure Chat Preferred 9a-5:30pm Office: [phone]    Mariel Shope [facility] 04/18/2024, 4:22 PM

## 2024-04-19 DIAGNOSIS — Z515 Encounter for palliative care: Secondary | ICD-10-CM

## 2024-04-19 DIAGNOSIS — Z7189 Other specified counseling: Secondary | ICD-10-CM

## 2024-04-19 DIAGNOSIS — R5381 Other malaise: Secondary | ICD-10-CM | POA: Diagnosis not present

## 2024-04-19 DIAGNOSIS — J189 Pneumonia, unspecified organism: Secondary | ICD-10-CM | POA: Diagnosis not present

## 2024-04-19 LAB — GLUCOSE, CAPILLARY
Glucose-Capillary: 127 mg/dL — ABNORMAL HIGH (ref 70–99)
Glucose-Capillary: 137 mg/dL — ABNORMAL HIGH (ref 70–99)
Glucose-Capillary: 161 mg/dL — ABNORMAL HIGH (ref 70–99)
Glucose-Capillary: 178 mg/dL — ABNORMAL HIGH (ref 70–99)
Glucose-Capillary: 192 mg/dL — ABNORMAL HIGH (ref 70–99)
Glucose-Capillary: 47 mg/dL — ABNORMAL LOW (ref 70–99)
Glucose-Capillary: 54 mg/dL — ABNORMAL LOW (ref 70–99)
Glucose-Capillary: 77 mg/dL (ref 70–99)

## 2024-04-19 MED ORDER — METOPROLOL TARTRATE 12.5 MG HALF TABLET
12.5000 mg | ORAL_TABLET | Freq: Every day | ORAL | Status: DC
  Administered 2024-04-19 – 2024-04-25 (×6): 12.5 mg via ORAL
  Filled 2024-04-19 (×7): qty 1

## 2024-04-19 MED ORDER — INSULIN GLARGINE-YFGN 100 UNIT/ML ~~LOC~~ SOLN
5.0000 [IU] | Freq: Every day | SUBCUTANEOUS | Status: DC
  Administered 2024-04-19: 5 [IU] via SUBCUTANEOUS
  Filled 2024-04-19 (×2): qty 0.05

## 2024-04-19 NOTE — TOC Progression Note (Signed)
 Transition of Care [facility]) - Progression Note    Patient Details  Name: Danielle Clarke MRN: [id number] Date of Birth: 1941-05-08  Transition of Care [facility] CM/SW Contact  Chrystina Naff [place], [place] Phone Number: 04/19/2024, 1:34 PM  Clinical Narrative: Spoke to pt's dtr [name] who confirmed plan is for pt to return to [facility] for continued rehab prior to returning home. Home [employer] Community/[employer] auth submitted, reference [id number]. SW will follow.   Paullette Boston, MSW, LCSW [phone] (coverage)        Expected Discharge Plan: Skilled Nursing Facility    Expected Discharge Plan and Services                                               Social Determinants of Health (SDOH) Interventions SDOH Screenings   Food Insecurity: No Food Insecurity (04/16/2024)  Housing: Low Risk  (04/16/2024)  Transportation Needs: No Transportation Needs (04/16/2024)  Utilities: Not At Risk (04/16/2024)  Depression (PHQ2-9): Low Risk  (05/28/2019)  Social Connections: Unknown (04/16/2024)  Tobacco Use: Low Risk  (04/16/2024)    Readmission Risk Interventions    04/02/2024   11:26 AM 02/07/2023   12:07 PM  Readmission Risk Prevention Plan  Post Dischage Appt  Complete  Medication Screening  Complete  Transportation Screening Complete Complete  PCP or Specialist Appt within 5-7 Days Complete   Home Care Screening Complete   Medication Review (RN CM) Referral to Pharmacy

## 2024-04-19 NOTE — Progress Notes (Signed)
 PROGRESS NOTE    Danielle Clarke  HQI:[id number] DOB: 1941-11-09 DOA: 04/15/2024 PCP: Elester Grim, MD   Brief Narrative: Danielle Clarke is a 83 y.o. 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  Patient presented secondary to chest pain and shortness of breath and was found to have evidence of pneumonia with associated acute hypoxia requiring supplemental oxygen.  Patient was started on antibiotics for management.  Chest pain workup was negative for cardiac etiology.   Assessment and Plan:  Right lower lobe pneumonia Present on admission.  CTA chest imaging consistent with aspiration pneumonia picture.  Patient was started on aztreonam  and transition to Levaquin . - Continue Levaquin  to complete a 7-day course of antibiotics  Chest tightness Present on admission.  Initial troponin of 14 with delta of 9, not consistent with ACS.  Likely related to underlying pneumonia.  Improved.  Acute respiratory failure with hypoxia Secondary to pneumonia. Patient using up to 3 L/min -Wean oxygen to room air -Ambulatory pulse ox  Aphasia Appears to have been a transient episode.  Initially, code stroke was ordered.  Neurology consulted.  CT and MRI imaging without evidence of acute stroke, however MRI was significant for left frontal and bilateral anterior temporal encephalomalacia including cortex  EEG was recommended, however this was declined by the patient.  Symptoms resolved.  Diabetes mellitus type 2 Poorly controlled based off last hemoglobin A1c of 9.7% from Apr 01, 2024.  Patient is managed on Lantus  and Humalog as an outpatient.  Regimen resumed on admission. - Decrease to Semglee  5 units daily, NovoLog  3 units 3 times daily with meals, SSI  Dysphagia Chronic. - Continue dysphagia 2 diet  Hyperlipidemia Aortic atherosclerosis -Continue simvastatin   Chronic  HFpEF Stable.  Abnormal CT chest finding CT chest significant for a 1.7 x 0.9 cm ovoid density within the right breast with recommendation for correlation with dedicated breast imaging.  Patient will need to follow-up outpatient for this.  Enlarged thyroid  gland Noted on thyroid  ultrasound on 5/21. TSH normal. Small 1.5 cmTI-RADS category 3 nodule noted in the right gland, meeting criteria for image surveillance. Recommendation for follow-up ultrasound in 1 year.   DVT prophylaxis: Lovenox  Code Status:   Code Status: Full Code Family Communication: None at bedside. Daughter on telephone while at bedside Disposition Plan: Discharge to SNF once bed is available   Consultants:  Neurology  Procedures:  None  Antimicrobials: Levaquin  Aztreonam     Subjective: Hungry. Not able to feed herself and needs help. Breathing well. No dyspnea.  Objective: BP (!) 101/41   Pulse 80   Temp 98.7 F (37.1 C)   Resp 18   Ht 5' (1.524 m)   Wt 39.5 kg   SpO2 97%   BMI 17.01 kg/m   Examination:  General exam: Appears calm and comfortable Respiratory system: Clear to auscultation. Respiratory effort normal. Cardiovascular system: S1 & S2 heard, RRR. No murmurs. Gastrointestinal system: Abdomen is nondistended, soft and nontender. Normal bowel sounds heard. Central nervous system: Alert and oriented. No focal neurological deficits. Musculoskeletal: No edema. No calf tenderness Psychiatry: Judgement and insight appear normal. Mood & affect appropriate.    Data Reviewed: I have personally reviewed following labs and imaging studies  CBC Lab Results  Component Value Date   WBC 5.6 04/17/2024   RBC 3.74 (L) 04/17/2024   HGB 10.7 (L) 04/17/2024   HCT 31.7 (L) 04/17/2024   MCV 84.8  04/17/2024   MCH 28.6 04/17/2024   PLT 253 04/17/2024   MCHC 33.8 04/17/2024   RDW 16.0 (H) 04/17/2024   LYMPHSABS 1.4 04/17/2024   MONOABS 0.4 04/17/2024   EOSABS 0.1 04/17/2024   BASOSABS 0.1  04/17/2024     Last metabolic panel Lab Results  Component Value Date   NA 136 04/17/2024   K 3.8 04/17/2024   CL 100 04/17/2024   CO2 27 04/17/2024   BUN 16 04/17/2024   CREATININE 0.57 04/17/2024   GLUCOSE 98 04/17/2024   GFRNONAA >60 04/17/2024   GFRAA >60 02/25/2019   CALCIUM 8.3 (L) 04/17/2024   PHOS 3.4 04/16/2024   PROT 5.6 (L) 04/15/2024   ALBUMIN 2.1 (L) 04/15/2024   LABGLOB 2.9 10/23/2023   AGRATIO 1.7 04/19/2023   BILITOT 0.4 04/15/2024   ALKPHOS 66 04/15/2024   AST 19 04/15/2024   ALT 23 04/15/2024   ANIONGAP 9 04/17/2024    GFR: Estimated Creatinine Clearance: 34.4 mL/min (by C-G formula based on SCr of 0.57 mg/dL).  Recent Results (from the past 240 hours)  Resp panel by RT-PCR (RSV, Flu A&B, Covid) Anterior Nasal Swab     Status: None   Collection Time: 04/15/24  3:57 PM   Specimen: Anterior Nasal Swab  Result Value Ref Range Status   SARS Coronavirus 2 by RT PCR NEGATIVE NEGATIVE Final   Influenza A by PCR NEGATIVE NEGATIVE Final   Influenza B by PCR NEGATIVE NEGATIVE Final    Comment: (NOTE) The Xpert Xpress SARS-CoV-2/FLU/RSV plus assay is intended as an aid in the diagnosis of influenza from Nasopharyngeal swab specimens and should not be used as a sole basis for treatment. Nasal washings and aspirates are unacceptable for Xpert Xpress SARS-CoV-2/FLU/RSV testing.  Fact Sheet for Patients: BloggerCourse.com  Fact Sheet for Healthcare Providers: SeriousBroker.it  This test is not yet approved or cleared by the [place]  FDA and has been authorized for detection and/or diagnosis of SARS-CoV-2 by FDA under an Emergency Use Authorization (EUA). This EUA will remain in effect (meaning this test can be used) for the duration of the COVID-19 declaration under Section 564(b)(1) of the Act, 21 U.S.C. section 360bbb-3(b)(1), unless the authorization is terminated or revoked.     Resp Syncytial  Virus by PCR NEGATIVE NEGATIVE Final    Comment: (NOTE) Fact Sheet for Patients: BloggerCourse.com  Fact Sheet for Healthcare Providers: SeriousBroker.it  This test is not yet approved or cleared by the [place]  FDA and has been authorized for detection and/or diagnosis of SARS-CoV-2 by FDA under an Emergency Use Authorization (EUA). This EUA will remain in effect (meaning this test can be used) for the duration of the COVID-19 declaration under Section 564(b)(1) of the Act, 21 U.S.C. section 360bbb-3(b)(1), unless the authorization is terminated or revoked.  Performed at [facility], [address], [place], [place] [address]       Radiology Studies: No results found.     LOS: 4 days    Aneita Keens, MD [facility] 04/19/2024, 12:53 PM   If 7PM-7AM, please contact night-coverage [email or web address]

## 2024-04-19 NOTE — Plan of Care (Signed)
  Problem: Fluid Volume: Goal: Ability to maintain a balanced intake and output will improve Outcome: Progressing   Problem: Metabolic: Goal: Ability to maintain appropriate glucose levels will improve Outcome: Progressing   Problem: Nutritional: Goal: Maintenance of adequate nutrition will improve Outcome: Progressing   Problem: Skin Integrity: Goal: Risk for impaired skin integrity will decrease Outcome: Progressing   Problem: Tissue Perfusion: Goal: Adequacy of tissue perfusion will improve Outcome: Progressing   Problem: Clinical Measurements: Goal: Ability to maintain clinical measurements within normal limits will improve Outcome: Progressing Goal: Diagnostic test results will improve Outcome: Progressing Goal: Respiratory complications will improve Outcome: Progressing Goal: Cardiovascular complication will be avoided Outcome: Progressing   Problem: Nutrition: Goal: Adequate nutrition will be maintained Outcome: Progressing   Problem: Elimination: Goal: Will not experience complications related to bowel motility Outcome: Progressing Goal: Will not experience complications related to urinary retention Outcome: Progressing   Problem: Pain Managment: Goal: General experience of comfort will improve and/or be controlled Outcome: Progressing   Problem: Safety: Goal: Ability to remain free from injury will improve Outcome: Progressing   Problem: Skin Integrity: Goal: Risk for impaired skin integrity will decrease Outcome: Progressing

## 2024-04-19 NOTE — Consult Note (Signed)
 Palliative Care Consult Note                                  Date: 04/19/2024   Patient Name: Danielle Clarke  DOB: 05-27-1941  MRN: [id number]  Age / Sex: 83 y.o., female  PCP: Elester Grim, MD Referring Physician: Verlyn Goad, MD  Reason for Consultation: Establishing goals of care  HPI/Patient Profile: 83 y.o. 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 She presented to the ED on 04/15/2024 with chest pain and shortness of breath and is admitted with right lower lobe pneumonia and acute respiratory failure with hypoxia.   Palliative medicine was consulted for goals of care.  Past Medical History:  Diagnosis Date   Aortic stenosis, mild    Arthritis    Asthmatic bronchitis    with colds per patient   Carotid artery disease (HCC)    40-59% bilateral ICA stenosis   Cataract    Cutaneous abscess of right foot    Glaucoma    Heart murmur    Dr Felipe Horton is her  cardiologist.    Hyperlipemia    Hypertension    Dr. 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 Lorie Rook and Dr. Honey Lusty   Syncope    Type 1 diabetes mellitus (HCC)     Subjective:   Extensive chart review has been completed prior to meeting with patient/family including labs, vital signs, imaging, progress/consult notes, orders, medications and available advance directive documents.    Per PT note 5/23, patient has been refusing to participate in repositioning or OOB mobility.   I met with patient, daughter, and son at bedside to discuss diagnosis, prognosis, GOC, disposition, and options.  I introduced Palliative Medicine as specialized medical care for people living with serious illness. It focuses on  providing relief from the symptoms and stress of a serious illness.   We discussed patient's current illness and what it means in the larger context of her ongoing co-morbidities. Current clinical status was reviewed. Natural disease trajectory of *** was discussed.  Created space and opportunity for patient and family to express thoughts and feelings regarding current medical situation. Values and goals of care were attempted to be elicited.  GOC Discussion: We reviewed patient's hospital course in detail. We discussed his/her multiple acute and chronic medical issues. I shared my concern that given ***, patient has less reserve to recover from additional ***.   Family shares that prior to her hospitalization 5/6, patient was living alone independently.    Discussed the "what ifs" and encouraged *** to consider what medical interventions patient would or would not want in the event his/her condition were to deteriorate, keeping in mind the concept of quality of life.    Discussed code status. I encouraged consideration of DNR/DNI status and provided education on evidence-based poor outcomes in similar hospitalized patients, as the cause of cardiac arrest would likely be associated with advanced illness rather than a reversible condition.  Questions and concerns addressed. Patient/family encouraged to call with questions or concerns.      Review of Systems  Neurological:  Positive for weakness.    Objective:  Primary Diagnoses: Present on Admission:  Pneumonia of right lower lobe due to infectious organism   Physical Exam Vitals reviewed.  Constitutional:      Comments: Frail, chronically ill-appearing  Pulmonary:     Effort: Pulmonary effort is normal.  Neurological:     Mental Status: She is alert and oriented to person, place, and time.     Vital Signs:  BP (!) 107/41 (BP Location: Left Arm)   Pulse 84   Temp 97.8 F (36.6 C)   Resp 18   Ht 5' (1.524 m)   Wt 39.5  kg   SpO2 98%   BMI 17.01 kg/m   Palliative Assessment/Data: PPS 40%     Assessment & Plan:   SUMMARY OF RECOMMENDATIONS   Code status changed to DNR/DNI Continue current supportive interventions Goal is to improve strength/mobility and ultimately return home; daughter is hopeful patient can go to rehab first  Outpatient palliative at discharge   Primary Decision Maker: {Primary Decision ZOXWR:60454}  Code Status/Advance Care Planning: {Palliative Code status:23503}  Symptom Management:  ***  Prognosis:  {Palliative Care Prognosis:23504}  Discharge Planning:  {Palliative dispostion:23505}   Discussed with: ***    Thank you for allowing us  to participate in the care of Missy Amos   Time Total: ***  Greater than 50%  of this time was spent counseling and coordinating care related to the above assessment and plan.  Signed by: Maisie Scotland, NP Palliative Medicine Team  Team Phone # [phone]  For individual providers, please see AMION

## 2024-04-19 NOTE — Plan of Care (Signed)

## 2024-04-20 DIAGNOSIS — R1310 Dysphagia, unspecified: Secondary | ICD-10-CM

## 2024-04-20 DIAGNOSIS — Z515 Encounter for palliative care: Secondary | ICD-10-CM | POA: Diagnosis not present

## 2024-04-20 DIAGNOSIS — J189 Pneumonia, unspecified organism: Secondary | ICD-10-CM | POA: Diagnosis not present

## 2024-04-20 DIAGNOSIS — R5381 Other malaise: Secondary | ICD-10-CM | POA: Diagnosis not present

## 2024-04-20 DIAGNOSIS — Z7189 Other specified counseling: Secondary | ICD-10-CM | POA: Diagnosis not present

## 2024-04-20 LAB — GLUCOSE, CAPILLARY
Glucose-Capillary: 280 mg/dL — ABNORMAL HIGH (ref 70–99)
Glucose-Capillary: 218 mg/dL — ABNORMAL HIGH (ref 70–99)
Glucose-Capillary: 144 mg/dL — ABNORMAL HIGH (ref 70–99)
Glucose-Capillary: 335 mg/dL — ABNORMAL HIGH (ref 70–99)
Glucose-Capillary: 308 mg/dL — ABNORMAL HIGH (ref 70–99)

## 2024-04-20 MED ORDER — INSULIN GLARGINE-YFGN 100 UNIT/ML ~~LOC~~ SOLN
10.0000 [IU] | Freq: Every day | SUBCUTANEOUS | Status: DC
  Administered 2024-04-20 – 2024-04-22 (×3): 10 [IU] via SUBCUTANEOUS
  Filled 2024-04-20 (×3): qty 0.1

## 2024-04-20 MED ORDER — TRAZODONE HCL 50 MG PO TABS
50.0000 mg | ORAL_TABLET | Freq: Every evening | ORAL | Status: DC | PRN
  Administered 2024-04-20 – 2024-04-24 (×4): 50 mg via ORAL
  Filled 2024-04-20 (×4): qty 1

## 2024-04-20 NOTE — Plan of Care (Signed)

## 2024-04-20 NOTE — Progress Notes (Signed)
 Palliative Medicine Progress Note   Patient Name: Danielle Clarke       Date: 04/20/2024 DOB: 09/27/1941  Age: 83 y.o. MRN#: [id number] Attending Physician: Verlyn Goad, MD Primary Care Physician: Elester Grim, MD Admit Date: 04/15/2024    HPI/Patient Profile: 83 y.o. 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 She presented to the ED on 04/15/2024 with chest pain and shortness of breath and is admitted with right lower lobe pneumonia and acute respiratory failure with hypoxia.    Palliative Medicine was consulted for goals of care.    Subjective: Chart reviewed. Patient assessed at bedside. She is sleeping soundly. RN is in the room attempting wake her to give an oral antibiotic and patient becomes agitated,  stating "leave me alone". She quickly falls back asleep.   I met with daughter in follow-up for GOC discussion and emotional support. Ongoing conversation was had regarding patient's current medical situation, including her continued refusal of interventions.   Daughter remains hopeful that patent can have some improvement and ultimately return home.  Discussed that if she is not improving as desired, the plan of care can always be changed to focus more on comfort.   We completed a MOST form today. Daughter has outlined their preferences for the following treatment decisions:  Cardiopulmonary Resuscitation: Do Not Attempt Resuscitation (DNR/No CPR)  Medical Interventions: Limited Additional Interventions: Use medical treatment, IV fluids and cardiac monitoring as indicated, DO NOT USE intubation or mechanical ventilation. May consider use of less invasive  airway support such as BiPAP or CPAP. Also provide comfort measures. Transfer to the hospital if indicated. Avoid intensive care.   Antibiotics: Determine use of limitation of antibiotics when infection occurs  IV Fluids: IV fluids for a defined trial period  Feeding Tube: Feeding tube for a defined trial period     Objective:  Physical Exam Vitals reviewed.  Constitutional:      General: She is sleeping. She is not in acute distress.    Appearance: She is ill-appearing.  Pulmonary:     Effort: No respiratory distress.  Neurological:     Mental Status: She is disoriented.  Psychiatric:        Behavior: Behavior is agitated.  Palliative Medicine Assessment & Plan   Assessment: Principal Problem:   Pneumonia of right lower lobe due to infectious organism    Recommendations/Plan: Continue current supportive interventions Goal is to improve strength/mobility and ultimately return home; daughter is hopeful patient can go to rehab first (even if for a short time) MOST form completed - copy made to scan into EMR Outpatient palliative at discharge PMT will continue to follow   Primary Decision Maker: HCPOA - daughter/[name]. I have requested a copy of the HCPOA document to scan into the EMR.   Code Status: DNR - Limited   Prognosis:  Unable to determine  Discharge Planning: To Be Determined   Thank you for allowing the Palliative Medicine Team to assist in the care of this patient.   Time: 50 minutes   Wynetta Heckle, NP   Please contact Palliative Medicine Team phone at [phone] for questions and concerns.  For individual providers, please see AMION.

## 2024-04-20 NOTE — Progress Notes (Signed)
 PROGRESS NOTE    Danielle Clarke  JYN:[id number] DOB: [birth year] DOA: 04/15/2024 PCP: Elester Grim, MD   Brief Narrative: Danielle Clarke is a 83 y.o. 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  Patient presented secondary to chest pain and shortness of breath and was found to have evidence of pneumonia with associated acute hypoxia requiring supplemental oxygen.  Patient was started on antibiotics for management.  Chest pain workup was negative for cardiac etiology.   Assessment and Plan:  Right lower lobe pneumonia Present on admission.  CTA chest imaging consistent with aspiration pneumonia picture.  Patient was started on aztreonam  and transition to Levaquin . - Continue Levaquin  to complete a 7-day course of antibiotics  Chest tightness Present on admission.  Initial troponin of 14 with delta of 9, not consistent with ACS.  Likely related to underlying pneumonia.  Improved.  Acute respiratory failure with hypoxia Secondary to pneumonia. Patient using up to 3 L/min -Wean oxygen to room air -Ambulatory pulse ox  Aphasia Appears to have been a transient episode.  Initially, code stroke was ordered.  Neurology consulted.  CT and MRI imaging without evidence of acute stroke, however MRI was significant for left frontal and bilateral anterior temporal encephalomalacia including cortex  EEG was recommended, however this was declined by the patient.  Symptoms resolved.  Diabetes mellitus type 2 Poorly controlled based off last hemoglobin A1c of 9.7% from Apr 01, 2024.  Patient is managed on Lantus  and Humalog as an outpatient.  Regimen resumed on admission. - Switch to Semglee  10 units daily and continue NovoLog  3 units 3 times daily with meals, SSI  Dysphagia Chronic. - Continue dysphagia 2 diet  Hyperlipidemia Aortic atherosclerosis -Continue simvastatin   Chronic  HFpEF Stable.  Abnormal CT chest finding CT chest significant for a 1.7 x 0.9 cm ovoid density within the right breast with recommendation for correlation with dedicated breast imaging.  Patient will need to follow-up outpatient for this.  Enlarged thyroid  gland Noted on thyroid  ultrasound on 5/21. TSH normal. Small 1.5 cmTI-RADS category 3 nodule noted in the right gland, meeting criteria for image surveillance. Recommendation for follow-up ultrasound in 1 year.   DVT prophylaxis: Lovenox  Code Status:   Code Status: Limited: Do not attempt resuscitation (DNR) -DNR-LIMITED -Do Not Intubate/DNI  Family Communication: None at bedside. Disposition Plan: Discharge to SNF once bed is available   Consultants:  Neurology  Procedures:  None  Antimicrobials: Levaquin  Aztreonam     Subjective: No events noted from overnight.  Objective: BP (!) 143/64 (BP Location: Left Arm)   Pulse 82   Temp (!) 97.4 F (36.3 C) (Oral)   Resp 16   Ht 5' (1.524 m)   Wt 39.5 kg   SpO2 100%   BMI 17.01 kg/m   Examination:  General exam: Appears calm and comfortable Respiratory system: Respiratory effort normal.   Data Reviewed: I have personally reviewed following labs and imaging studies  CBC Lab Results  Component Value Date   WBC 5.6 04/17/2024   RBC 3.74 (L) 04/17/2024   HGB 10.7 (L) 04/17/2024   HCT 31.7 (L) 04/17/2024   MCV 84.8 04/17/2024   MCH 28.6 04/17/2024   PLT 253 04/17/2024   MCHC 33.8 04/17/2024   RDW 16.0 (H) 04/17/2024   LYMPHSABS 1.4 04/17/2024   MONOABS 0.4 04/17/2024   EOSABS 0.1 04/17/2024   BASOSABS 0.1 04/17/2024     Last metabolic  panel Lab Results  Component Value Date   NA 136 04/17/2024   K 3.8 04/17/2024   CL 100 04/17/2024   CO2 27 04/17/2024   BUN 16 04/17/2024   CREATININE 0.57 04/17/2024   GLUCOSE 98 04/17/2024   GFRNONAA >60 04/17/2024   GFRAA >60 02/25/2019   CALCIUM 8.3 (L) 04/17/2024   PHOS 3.4 04/16/2024   PROT 5.6 (L)  04/15/2024   ALBUMIN 2.1 (L) 04/15/2024   LABGLOB 2.9 10/23/2023   AGRATIO 1.7 04/19/2023   BILITOT 0.4 04/15/2024   ALKPHOS 66 04/15/2024   AST 19 04/15/2024   ALT 23 04/15/2024   ANIONGAP 9 04/17/2024    GFR: Estimated Creatinine Clearance: 34.4 mL/min (by C-G formula based on SCr of 0.57 mg/dL).  Recent Results (from the past 240 hours)  Resp panel by RT-PCR (RSV, Flu A&B, Covid) Anterior Nasal Swab     Status: None   Collection Time: 04/15/24  3:57 PM   Specimen: Anterior Nasal Swab  Result Value Ref Range Status   SARS Coronavirus 2 by RT PCR NEGATIVE NEGATIVE Final   Influenza A by PCR NEGATIVE NEGATIVE Final   Influenza B by PCR NEGATIVE NEGATIVE Final    Comment: (NOTE) The Xpert Xpress SARS-CoV-2/FLU/RSV plus assay is intended as an aid in the diagnosis of influenza from Nasopharyngeal swab specimens and should not be used as a sole basis for treatment. Nasal washings and aspirates are unacceptable for Xpert Xpress SARS-CoV-2/FLU/RSV testing.  Fact Sheet for Patients: BloggerCourse.com  Fact Sheet for Healthcare Providers: SeriousBroker.it  This test is not yet approved or cleared by the [place]  FDA and has been authorized for detection and/or diagnosis of SARS-CoV-2 by FDA under an Emergency Use Authorization (EUA). This EUA will remain in effect (meaning this test can be used) for the duration of the COVID-19 declaration under Section 564(b)(1) of the Act, 21 U.S.C. section 360bbb-3(b)(1), unless the authorization is terminated or revoked.     Resp Syncytial Virus by PCR NEGATIVE NEGATIVE Final    Comment: (NOTE) Fact Sheet for Patients: BloggerCourse.com  Fact Sheet for Healthcare Providers: SeriousBroker.it  This test is not yet approved or cleared by the [place]  FDA and has been authorized for detection and/or diagnosis of SARS-CoV-2 by FDA  under an Emergency Use Authorization (EUA). This EUA will remain in effect (meaning this test can be used) for the duration of the COVID-19 declaration under Section 564(b)(1) of the Act, 21 U.S.C. section 360bbb-3(b)(1), unless the authorization is terminated or revoked.  Performed at [facility], [address], [place], [place] [address]       Radiology Studies: No results found.     LOS: 5 days    Aneita Keens, MD [facility] 04/20/2024, 8:25 AM   If 7PM-7AM, please contact night-coverage [email or web address]

## 2024-04-21 DIAGNOSIS — J189 Pneumonia, unspecified organism: Secondary | ICD-10-CM | POA: Diagnosis not present

## 2024-04-21 LAB — GLUCOSE, CAPILLARY
Glucose-Capillary: 271 mg/dL — ABNORMAL HIGH (ref 70–99)
Glucose-Capillary: 53 mg/dL — ABNORMAL LOW (ref 70–99)
Glucose-Capillary: 264 mg/dL — ABNORMAL HIGH (ref 70–99)
Glucose-Capillary: 387 mg/dL — ABNORMAL HIGH (ref 70–99)
Glucose-Capillary: 255 mg/dL — ABNORMAL HIGH (ref 70–99)
Glucose-Capillary: 249 mg/dL — ABNORMAL HIGH (ref 70–99)
Glucose-Capillary: 312 mg/dL — ABNORMAL HIGH (ref 70–99)

## 2024-04-21 MED ORDER — DEXTROSE 50 % IV SOLN
12.5000 g | INTRAVENOUS | Status: AC
  Administered 2024-04-21: 12.5 g via INTRAVENOUS
  Filled 2024-04-21: qty 50

## 2024-04-21 NOTE — Progress Notes (Signed)
 PROGRESS NOTE    NATAKI Clarke  ONG:[id number] DOB: [birth year] DOA: 04/15/2024 PCP: Elester Grim, MD   Brief Narrative: Danielle Clarke is a 83 y.o. 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  Patient presented secondary to chest pain and shortness of breath and was found to have evidence of pneumonia with associated acute hypoxia requiring supplemental oxygen.  Patient was started on antibiotics for management.  Chest pain workup was negative for cardiac etiology.   Assessment and Plan:  Right lower lobe pneumonia Present on admission.  CTA chest imaging consistent with aspiration pneumonia picture.  Patient was started on aztreonam  and transition to Levaquin . - Continue Levaquin  to complete a 7-day course of antibiotics  Chest tightness Present on admission.  Initial troponin of 14 with delta of 9, not consistent with ACS.  Likely related to underlying pneumonia.  Improved.  Acute respiratory failure with hypoxia Secondary to pneumonia. Patient using up to 3 L/min -Wean oxygen to room air -Ambulatory pulse ox  Aphasia Appears to have been a transient episode.  Initially, code stroke was ordered.  Neurology consulted.  CT and MRI imaging without evidence of acute stroke, however MRI was significant for left frontal and bilateral anterior temporal encephalomalacia including cortex  EEG was recommended, however this was declined by the patient.  Symptoms resolved.  Diabetes mellitus type 2 Poorly controlled based off last hemoglobin A1c of 9.7% from Apr 01, 2024.  Patient is managed on Lantus  and Humalog as an outpatient.  Regimen resumed on admission. Hypoglycemia overnight. - Continue Semglee  10 units daily and continue NovoLog  3 units 3 times daily with meals, SSI - Discontinue night time SSI  Dysphagia Chronic. - Continue dysphagia 2  diet  Hyperlipidemia Aortic atherosclerosis -Continue simvastatin   Chronic HFpEF Stable.  Abnormal CT chest finding CT chest significant for a 1.7 x 0.9 cm ovoid density within the right breast with recommendation for correlation with dedicated breast imaging.  Patient will need to follow-up outpatient for this.  Enlarged thyroid  gland Noted on thyroid  ultrasound on 5/21. TSH normal. Small 1.5 cmTI-RADS category 3 nodule noted in the right gland, meeting criteria for image surveillance. Recommendation for follow-up ultrasound in 1 year.   DVT prophylaxis: Lovenox  Code Status:   Code Status: Limited: Do not attempt resuscitation (DNR) -DNR-LIMITED -Do Not Intubate/DNI  Family Communication: None at bedside. Disposition Plan: Discharge to SNF once bed is available   Consultants:  Neurology Palliative care medicine  Procedures:  None  Antimicrobials: Levaquin  Aztreonam     Subjective: No issues or concerns this morning.  Objective: BP 111/63 (BP Location: Left Arm)   Pulse 98   Temp 98.7 F (37.1 C)   Resp 18   Ht 5' (1.524 m)   Wt 39.5 kg   SpO2 98%   BMI 17.01 kg/m   Examination:  General exam: Appears calm and comfortable  Respiratory system: Clear to auscultation. Respiratory effort normal. Cardiovascular system: S1 & S2 heard, RRR. Gastrointestinal system: Abdomen is nondistended, soft and nontender. Normal bowel sounds heard. Central nervous system: Alert and oriented. No focal neurological deficits.   Data Reviewed: I have personally reviewed following labs and imaging studies  CBC Lab Results  Component Value Date   WBC 5.6 04/17/2024   RBC 3.74 (L) 04/17/2024   HGB 10.7 (L) 04/17/2024   HCT 31.7 (L) 04/17/2024   MCV 84.8 04/17/2024   MCH 28.6 04/17/2024  PLT 253 04/17/2024   MCHC 33.8 04/17/2024   RDW 16.0 (H) 04/17/2024   LYMPHSABS 1.4 04/17/2024   MONOABS 0.4 04/17/2024   EOSABS 0.1 04/17/2024   BASOSABS 0.1 04/17/2024     Last  metabolic panel Lab Results  Component Value Date   NA 136 04/17/2024   K 3.8 04/17/2024   CL 100 04/17/2024   CO2 27 04/17/2024   BUN 16 04/17/2024   CREATININE 0.57 04/17/2024   GLUCOSE 98 04/17/2024   GFRNONAA >60 04/17/2024   GFRAA >60 02/25/2019   CALCIUM 8.3 (L) 04/17/2024   PHOS 3.4 04/16/2024   PROT 5.6 (L) 04/15/2024   ALBUMIN 2.1 (L) 04/15/2024   LABGLOB 2.9 10/23/2023   AGRATIO 1.7 04/19/2023   BILITOT 0.4 04/15/2024   ALKPHOS 66 04/15/2024   AST 19 04/15/2024   ALT 23 04/15/2024   ANIONGAP 9 04/17/2024    GFR: Estimated Creatinine Clearance: 34.4 mL/min (by C-G formula based on SCr of 0.57 mg/dL).  Recent Results (from the past 240 hours)  Resp panel by RT-PCR (RSV, Flu A&B, Covid) Anterior Nasal Swab     Status: None   Collection Time: 04/15/24  3:57 PM   Specimen: Anterior Nasal Swab  Result Value Ref Range Status   SARS Coronavirus 2 by RT PCR NEGATIVE NEGATIVE Final   Influenza A by PCR NEGATIVE NEGATIVE Final   Influenza B by PCR NEGATIVE NEGATIVE Final    Comment: (NOTE) The Xpert Xpress SARS-CoV-2/FLU/RSV plus assay is intended as an aid in the diagnosis of influenza from Nasopharyngeal swab specimens and should not be used as a sole basis for treatment. Nasal washings and aspirates are unacceptable for Xpert Xpress SARS-CoV-2/FLU/RSV testing.  Fact Sheet for Patients: BloggerCourse.com  Fact Sheet for Healthcare Providers: SeriousBroker.it  This test is not yet approved or cleared by the [place]  FDA and has been authorized for detection and/or diagnosis of SARS-CoV-2 by FDA under an Emergency Use Authorization (EUA). This EUA will remain in effect (meaning this test can be used) for the duration of the COVID-19 declaration under Section 564(b)(1) of the Act, 21 U.S.C. section 360bbb-3(b)(1), unless the authorization is terminated or revoked.     Resp Syncytial Virus by PCR NEGATIVE  NEGATIVE Final    Comment: (NOTE) Fact Sheet for Patients: BloggerCourse.com  Fact Sheet for Healthcare Providers: SeriousBroker.it  This test is not yet approved or cleared by the [place]  FDA and has been authorized for detection and/or diagnosis of SARS-CoV-2 by FDA under an Emergency Use Authorization (EUA). This EUA will remain in effect (meaning this test can be used) for the duration of the COVID-19 declaration under Section 564(b)(1) of the Act, 21 U.S.C. section 360bbb-3(b)(1), unless the authorization is terminated or revoked.  Performed at [facility], [address] [place], [place] [address]       Radiology Studies: No results found.     LOS: 6 days    Aneita Keens, MD [facility] 04/21/2024, 1:01 PM   If 7PM-7AM, please contact night-coverage [email or web address]

## 2024-04-21 NOTE — TOC Progression Note (Signed)
 Transition of Care [facility]) - Progression Note    Patient Details  Name: Danielle Clarke MRN: [id number] Date of Birth: July 14, 1941  Transition of [facility]) CM/SW Contact  Anona Giovannini A [place], LCSW Phone Number: 04/21/2024, 3:02 PM  Clinical Narrative:     CSW spoke with pt and [name] at bedside. Pt said that she would be ok going back to [facility] for rehab and then home and said understands she needs to work with PT. She said she is feeling better today and will try to say yes even when she is in some pain and not feeling up to it. CSW notified PT that plan is still for short term rehab. Will provide updated PT note to insurance when available.    TOC will continue to follow.   Expected Discharge Plan: Skilled Nursing Facility    Expected Discharge Plan and Services                                               Social Determinants of Health (SDOH) Interventions SDOH Screenings   Food Insecurity: No Food Insecurity (04/16/2024)  Housing: Low Risk  (04/16/2024)  Transportation Needs: No Transportation Needs (04/16/2024)  Utilities: Not At Risk (04/16/2024)  Depression (PHQ2-9): Low Risk  (05/28/2019)  Social Connections: Unknown (04/16/2024)  Tobacco Use: Low Risk  (04/16/2024)    Readmission Risk Interventions    04/02/2024   11:26 AM 02/07/2023   12:07 PM  Readmission Risk Prevention Plan  Post Dischage Appt  Complete  Medication Screening  Complete  Transportation Screening Complete Complete  PCP or Specialist Appt within 5-7 Days Complete   Home Care Screening Complete   Medication Review (RN CM) Referral to Pharmacy

## 2024-04-22 DIAGNOSIS — R627 Adult failure to thrive: Secondary | ICD-10-CM | POA: Diagnosis not present

## 2024-04-22 DIAGNOSIS — Z515 Encounter for palliative care: Secondary | ICD-10-CM | POA: Diagnosis not present

## 2024-04-22 DIAGNOSIS — J189 Pneumonia, unspecified organism: Secondary | ICD-10-CM | POA: Diagnosis not present

## 2024-04-22 DIAGNOSIS — Z7189 Other specified counseling: Secondary | ICD-10-CM | POA: Diagnosis not present

## 2024-04-22 LAB — GLUCOSE, CAPILLARY
Glucose-Capillary: 204 mg/dL — ABNORMAL HIGH (ref 70–99)
Glucose-Capillary: 391 mg/dL — ABNORMAL HIGH (ref 70–99)
Glucose-Capillary: 114 mg/dL — ABNORMAL HIGH (ref 70–99)
Glucose-Capillary: 403 mg/dL — ABNORMAL HIGH (ref 70–99)

## 2024-04-22 LAB — CREATININE, SERUM
Creatinine, Ser: 0.76 mg/dL (ref 0.44–1.00)
GFR, Estimated: >60 mL/min (ref >60)

## 2024-04-22 MED ORDER — INSULIN ASPART 100 UNIT/ML IJ SOLN
0.0000 [IU] | Freq: Three times a day (TID) | INTRAMUSCULAR | Status: DC
  Administered 2024-04-22: 6 [IU] via SUBCUTANEOUS
  Administered 2024-04-23: 2 [IU] via SUBCUTANEOUS
  Administered 2024-04-23 (×2): 1 [IU] via SUBCUTANEOUS
  Administered 2024-04-24: 2 [IU] via SUBCUTANEOUS
  Administered 2024-04-25 (×2): 1 [IU] via SUBCUTANEOUS

## 2024-04-22 MED ORDER — LIDOCAINE 5 % EX PTCH
1.0000 | MEDICATED_PATCH | CUTANEOUS | Status: DC
  Administered 2024-04-22 – 2024-04-24 (×3): 1 via TRANSDERMAL
  Filled 2024-04-22 (×3): qty 1

## 2024-04-22 MED ORDER — INSULIN ASPART 100 UNIT/ML IJ SOLN: 0.0000 [IU] | Freq: Three times a day (TID) | INTRAMUSCULAR | Status: DC

## 2024-04-22 MED ORDER — INSULIN GLARGINE-YFGN 100 UNIT/ML ~~LOC~~ SOLN
6.0000 [IU] | Freq: Two times a day (BID) | SUBCUTANEOUS | Status: DC
  Administered 2024-04-22 – 2024-04-25 (×6): 6 [IU] via SUBCUTANEOUS
  Filled 2024-04-22 (×7): qty 0.06

## 2024-04-22 NOTE — Progress Notes (Signed)
 Physical Therapy Treatment Patient Details Name: Danielle Clarke MRN: [id number] DOB: [birth year] Today's Date: 04/22/2024   History of Present Illness Pt is 83 yo female who presents 04/15/24 who presents to the ER via EMS from home after being discharged from SNF rehab 2 days ago with c/o chest pain and SOB. Pt recently admitted  04/01/24-04/04/24 for syncope, T12 compression fx, and dysphagia. In ED pt noted to become aphasic and blank stare, code stroke called. Chest CT negative for PE. Brain MRI shows "Mild T2 hyperintensity in the cerebral white matter and pons usually from chronic small vessel ischemia. Brain atrophy. No  acute infarct, acute hemorrhage, hydrocephalus, or collection." Pt refused recommended EEG. PMH significant for dysphagia, CAD, TAVR, bradycardia with PPM placement, HTN, HLD, DM1, ICH.    PT Comments  Pt received in supine, PTA reattempt per daughter's request as she was concerned maybe pt's confusion was contributing to her refusal earlier in the day. Pt initially states "maybe I will" when PTA and daughter asking if she will participate in therapy session. Pt air bed slowly placed into more upright chair posture, with pt cued to use bed rails PRN for comfort during transition. Once bed trended forward and bed in chair posture, pt c/o increased back pain. Pt tolerated closer to 60-70 degree posture for ~3 mins while RUE BP being assessed, with max encouragement and cues for breathing technique. Pt only minimally receptive to education and more anxious/irritable after BP check, so returned to supine with HOB flat. BP stable today with HOB elevated and in more flat supine posture. Reviewed DME recommendations depending on pt disposition, in case she does end up going home with more palliative type services vs post-acute rehab, DME recs updated below supervising PT Kayleen Party QB messaged to discuss. Patient will benefit from continued inpatient follow up therapy, <3 hours/day pending ability  to tolerate EOB/OOB mobility, plan to have PT re-evaluate her following date when daughter present to clarify disposition plan, also appreciate palliative involvement given pt limited tolerance for any repositioning or mobility.   If plan is discharge home, recommend the following: Two people to help with walking and/or transfers;A lot of help with bathing/dressing/bathroom;Assistance with feeding;Help with stairs or ramp for entrance;Assist for transportation   Can travel by private vehicle     No  Equipment Recommendations  Other (comment);Rolling walker (2 wheels);BSC/3in1;Wheelchair (measurements PT);Wheelchair cushion (measurements PT);Hospital bed;Hoyer lift (likely to need PTAR transport home; if she has STE home, may need to install a ramp)    Recommendations for Other Services       Precautions / Restrictions Precautions Precautions: Fall;Other (comment) Recall of Precautions/Restrictions: Impaired Precaution/Restrictions Comments: back, has TLSO brace, family cannot find brace Required Braces or Orthoses: Spinal Brace Spinal Brace: Thoracolumbosacral orthotic Restrictions Weight Bearing Restrictions Per Provider Order: No     Mobility  Bed Mobility Overal bed mobility: Needs Assistance             General bed mobility comments: Refused; pt bed placed in more upright chair posture to simulate EOB/chair position but pt unable to tolerate longer than time it took to check BP, maybe ~2 mins prior to pt requesting return "to flat" posture in bed.    Transfers                   General transfer comment: Refused due to pain/discomfort    Ambulation/Gait  Stairs             Wheelchair Mobility     Tilt Bed    Modified Rankin (Stroke Patients Only)       Balance Overall balance assessment: History of Falls     Sitting balance - Comments: pt refused EOB                                     Communication Communication Communication: No apparent difficulties  Cognition Arousal: Alert Behavior During Therapy: Flat affect, Lability   PT - Cognitive impairments: Difficult to assess, Safety/Judgement, Initiation, Orientation, Memory Difficult to assess due to:  (pt reluctant to answer a lot of questions) Orientation impairments: Time                   PT - Cognition Comments: Pt does not appear oriented to time of day; per daughter, pt does not recall PTA coming to her room earlier to speak with her; possible sundowning? Pt appeared more oriented in AM but is following some simple commands this date. Following commands: Intact (for bed-level but limited assessment due to pt irritability with bed position changed)      Cueing Cueing Techniques: Verbal cues, Gestural cues  Exercises Other Exercises Other Exercises: Encouraged use of IS and HEP, handout provided to daughter in case pt more receptive to participating with family members leading the instruction.    General Comments General comments (skin integrity, edema, etc.): Pt becomes irritable by bed positioning (slowly) to more chair posture due to c/o back pain, but also irritable by pressure from BP cuff on her R arm. Pt appears hypersensitive to any pressure on her skin at this time, RN notified; daughter present and observes pt response to therapy intervention/positional change attempts.      Pertinent Vitals/Pain Pain Assessment Pain Assessment: PAINAD Breathing: occasional labored breathing, short period of hyperventilation Negative Vocalization: repeated troubled calling out, loud moaning/groaning, crying Facial Expression: facial grimacing Body Language: tense, distressed pacing, fidgeting Consolability: unable to console, distract or reassure PAINAD Score: 8 Pain Location: back with bed in more chair position Pain Descriptors / Indicators: Discomfort, Guarding, Grimacing, Sharp Pain Intervention(s):  Limited activity within patient's tolerance, Monitored during session, Repositioned, Patient requesting pain meds-RN notified    Home Living                          Prior Function            PT Goals (current goals can now be found in the care plan section) Acute Rehab PT Goals Patient Stated Goal: get better and return home to cat PT Goal Formulation: With patient/family Time For Goal Achievement: 04/30/24 Progress towards PT goals: Not progressing toward goals - comment;PT to reassess next treatment    Frequency    Min 2X/week      PT Plan      Co-evaluation              AM-PAC PT "6 Clicks" Mobility   Outcome Measure  Help needed turning from your back to your side while in a flat bed without using bedrails?: Total Help needed moving from lying on your back to sitting on the side of a flat bed without using bedrails?: Total Help needed moving to and from a bed to a chair (including a wheelchair)?: Total Help needed standing up from  a chair using your arms (e.g., wheelchair or bedside chair)?: Total Help needed to walk in hospital room?: Total Help needed climbing 3-5 steps with a railing? : Total 6 Click Score: 6    End of Session Equipment Utilized During Treatment: Oxygen Activity Tolerance: Treatment limited secondary to agitation;Other (comment) (lack of desire to participate in any self-care or mobility related activities.) Patient left: in bed;with call bell/phone within reach;with bed alarm set;with family/visitor present;Other (comment) (caregiver in room) Nurse Communication: Mobility status;Need for lift equipment;Precautions;Other (comment) (recommend prevalon boots and air bed given pt immobility) PT Visit Diagnosis: Muscle weakness (generalized) (M62.81);Difficulty in walking, not elsewhere classified (R26.2);Pain Pain - part of body:  (back)     Time: 8657-8469 PT Time Calculation (min) (ACUTE ONLY): 16 min  Charges:     $Therapeutic Activity: 8-22 mins PT General Charges $$ ACUTE PT VISIT: 1 Visit                     Danielle Clarke P., PTA Acute Rehabilitation Services Secure Chat Preferred 9a-5:30pm Office: [phone]    Danielle Clarke [facility] 04/22/2024, 6:18 PM

## 2024-04-22 NOTE — TOC Progression Note (Signed)
 Transition of Care [facility]) - Progression Note    Patient Details  Name: Danielle Clarke MRN: [id number] Date of Birth: [birth year]  Transition of [facility]) CM/SW Contact  Ross Bender A [place], LCSW Phone Number: 04/22/2024, 2:43 PM  Clinical Narrative:     CSW was notified by pt's insurance that pt is being offered a Cabin crew. Contact [phone] option 5. deadline of 1pm on 5/28. Member ID: [id number]. Spoke with provider, will hold off on the p2p and wait until updated PT note with pt working with therapy team. Will start another authorization request once pt has updated PT or continue with alternate dispo plan if rehab at SNF is not an option.    TOC will continue to follow.   Expected Discharge Plan: Skilled Nursing Facility    Expected Discharge Plan and Services                                               Social Determinants of Health (SDOH) Interventions SDOH Screenings   Food Insecurity: No Food Insecurity (04/16/2024)  Housing: Low Risk  (04/16/2024)  Transportation Needs: No Transportation Needs (04/16/2024)  Utilities: Not At Risk (04/16/2024)  Depression (PHQ2-9): Low Risk  (05/28/2019)  Social Connections: Unknown (04/16/2024)  Tobacco Use: Low Risk  (04/16/2024)    Readmission Risk Interventions    04/02/2024   11:26 AM 02/07/2023   12:07 PM  Readmission Risk Prevention Plan  Post Dischage Appt  Complete  Medication Screening  Complete  Transportation Screening Complete Complete  PCP or Specialist Appt within 5-7 Days Complete   Home Care Screening Complete   Medication Review (RN CM) Referral to Pharmacy

## 2024-04-22 NOTE — Progress Notes (Signed)
 Palliative Medicine Progress Note   Patient Name: Danielle Clarke       Date: 04/22/2024 DOB: [birth year]  Age: 83 y.o. MRN#: [id number] Attending Physician: Verlyn Goad, MD Primary Care Physician: Elester Grim, MD Admit Date: 04/15/2024    HPI/Patient Profile: 83 y.o. 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 She presented to the ED on 04/15/2024 with chest pain and shortness of breath and is admitted with right lower lobe pneumonia and acute respiratory failure with hypoxia.    Palliative Medicine was consulted for goals of care.  Subjective: Chart reviewed. Note patient refused PT again today.  Bedside visit. Patient is initially sleeping, but arouses easily to voice. She is not able to tell me why she is in the hospital and is not able to participate in meaningful GOC discussion.   I spoke with daughter/[name] by phone. Discussed that PT would attempt again tomorrow, but likely will sign off if patient continues to decline participation. Daughter wants to be present during next attempted PT session, hoping she can encourage patient to participate. I was able to speak with PT and coordinate a time window for 10am-12pm.     Objective:  Physical Exam Constitutional:      General: She is not in acute distress.    Comments: Frail and chronically ill-appearing  Pulmonary:     Effort: Pulmonary effort is normal.  Neurological:     Mental Status: She is easily aroused. She is lethargic and confused.            Palliative Medicine Assessment & Plan   Assessment: Principal Problem:   Pneumonia of right lower lobe due to infectious organism     Recommendations/Plan: Continue current supportive interventions Goal is to improve strength/mobility and ultimately return home; daughter is hopeful patient can go to rehab first (even if for a short time) MOST form completed 5/25 - copy made to scan into EMR Outpatient palliative at discharge PMT will continue to follow   Primary Decision Maker: HCPOA - daughter/[name]. I have requested a copy of the HCPOA document to scan into the EMR.   Code Status: DNR - Limited   Prognosis:  Unable to determine  Discharge Planning: To Be Determined  Thank you for allowing the Palliative Medicine Team to assist in the care of this patient.   Time: 35 minutes   Wynetta Heckle, NP   Please contact Palliative Medicine Team phone at [phone] for questions and concerns.  For individual providers, please see AMION.

## 2024-04-22 NOTE — Plan of Care (Signed)

## 2024-04-22 NOTE — Progress Notes (Signed)
 Bilateral heels are slightly red, but blanchable. Explained to pt why she needed the mepilex foams and pt said "don't put that on".  Again explained it was to prevent sores and she stated "stop being bitchy".  MD made aware.

## 2024-04-22 NOTE — Progress Notes (Signed)
 PROGRESS NOTE    Danielle Clarke  JWJ:[id number] DOB: 10-15-1941 DOA: 04/15/2024 PCP: Elester Grim, MD   Brief Narrative: Danielle Clarke is a 83 y.o. 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  Patient presented secondary to chest pain and shortness of breath and was found to have evidence of pneumonia with associated acute hypoxia requiring supplemental oxygen.  Patient was started on antibiotics for management.  Chest pain workup was negative for cardiac etiology.   Assessment and Plan:  Right lower lobe pneumonia Present on admission.  CTA chest imaging consistent with aspiration pneumonia picture.  Patient was started on aztreonam  and transition to Levaquin . - Continue Levaquin  to complete a 7-day course of antibiotics  Chest tightness Present on admission.  Initial troponin of 14 with delta of 9, not consistent with ACS.  Likely related to underlying pneumonia.  Improved.  Acute respiratory failure with hypoxia Secondary to pneumonia. Patient using up to 3 L/min. Weaned to room air.  Aphasia Appears to have been a transient episode.  Initially, code stroke was ordered.  Neurology consulted.  CT and MRI imaging without evidence of acute stroke, however MRI was significant for left frontal and bilateral anterior temporal encephalomalacia including cortex  EEG was recommended, however this was declined by the patient.  Symptoms resolved.  Diabetes mellitus type 1 Incorrectly documented as type 2, prior. Poorly controlled based off last hemoglobin A1c of 9.7% from Apr 01, 2024.  Patient is managed on Lantus  and Humalog as an outpatient.  Regimen resumed on admission. Labile blood sugars. - Change to Semglee  6 units BID and continue NovoLog  3 units 3 times daily with meals, SSI  Dysphagia Chronic. - Continue dysphagia 2 diet  Hyperlipidemia Aortic  atherosclerosis -Continue simvastatin   Chronic HFpEF Stable.  Abnormal CT chest finding CT chest significant for a 1.7 x 0.9 cm ovoid density within the right breast with recommendation for correlation with dedicated breast imaging.  Patient will need to follow-up outpatient for this.  Enlarged thyroid  gland Noted on thyroid  ultrasound on 5/21. TSH normal. Small 1.5 cmTI-RADS category 3 nodule noted in the right gland, meeting criteria for image surveillance. Recommendation for follow-up ultrasound in 1 year.   DVT prophylaxis: Lovenox  Code Status:   Code Status: Limited: Do not attempt resuscitation (DNR) -DNR-LIMITED -Do Not Intubate/DNI  Family Communication: Daughter at bedside. Disposition Plan: Discharge to SNF once bed is available   Consultants:  Neurology Palliative care medicine  Procedures:  None  Antimicrobials: Levaquin  Aztreonam     Subjective: Not answering questions voluntarily. Wants to be left alone.  Objective: BP (!) 140/52 (BP Location: Right Arm)   Pulse 93   Temp 98.3 F (36.8 C) (Oral)   Resp 18   Ht 5' (1.524 m)   Wt 39.5 kg   SpO2 95%   BMI 17.01 kg/m   Examination:  General exam: Appears calm and comfortable Respiratory system: Clear to auscultation. Respiratory effort normal. Cardiovascular system: S1 & S2 heard, RRR.  Gastrointestinal system: Abdomen is nondistended, soft and nontender. No organomegaly or masses felt. Normal bowel sounds heard. Central nervous system: Appears to be somnolent but very alert when sh was continually badgered.  Musculoskeletal: No edema. No calf tenderness Skin: No cyanosis. No rashes   Data Reviewed: I have personally reviewed following labs and imaging studies  CBC Lab Results  Component Value Date   WBC 5.6 04/17/2024   RBC 3.74 (  L) 04/17/2024   HGB 10.7 (L) 04/17/2024   HCT 31.7 (L) 04/17/2024   MCV 84.8 04/17/2024   MCH 28.6 04/17/2024   PLT 253 04/17/2024   MCHC 33.8 04/17/2024   RDW  16.0 (H) 04/17/2024   LYMPHSABS 1.4 04/17/2024   MONOABS 0.4 04/17/2024   EOSABS 0.1 04/17/2024   BASOSABS 0.1 04/17/2024     Last metabolic panel Lab Results  Component Value Date   NA 136 04/17/2024   K 3.8 04/17/2024   CL 100 04/17/2024   CO2 27 04/17/2024   BUN 16 04/17/2024   CREATININE 0.76 04/22/2024   GLUCOSE 98 04/17/2024   GFRNONAA >60 04/22/2024   GFRAA >60 02/25/2019   CALCIUM 8.3 (L) 04/17/2024   PHOS 3.4 04/16/2024   PROT 5.6 (L) 04/15/2024   ALBUMIN 2.1 (L) 04/15/2024   LABGLOB 2.9 10/23/2023   AGRATIO 1.7 04/19/2023   BILITOT 0.4 04/15/2024   ALKPHOS 66 04/15/2024   AST 19 04/15/2024   ALT 23 04/15/2024   ANIONGAP 9 04/17/2024    GFR: Estimated Creatinine Clearance: 34.4 mL/min (by C-G formula based on SCr of 0.76 mg/dL).  Recent Results (from the past 240 hours)  Resp panel by RT-PCR (RSV, Flu A&B, Covid) Anterior Nasal Swab     Status: None   Collection Time: 04/15/24  3:57 PM   Specimen: Anterior Nasal Swab  Result Value Ref Range Status   SARS Coronavirus 2 by RT PCR NEGATIVE NEGATIVE Final   Influenza A by PCR NEGATIVE NEGATIVE Final   Influenza B by PCR NEGATIVE NEGATIVE Final    Comment: (NOTE) The Xpert Xpress SARS-CoV-2/FLU/RSV plus assay is intended as an aid in the diagnosis of influenza from Nasopharyngeal swab specimens and should not be used as a sole basis for treatment. Nasal washings and aspirates are unacceptable for Xpert Xpress SARS-CoV-2/FLU/RSV testing.  Fact Sheet for Patients: BloggerCourse.com  Fact Sheet for Healthcare Providers: SeriousBroker.it  This test is not yet approved or cleared by the [place]  FDA and has been authorized for detection and/or diagnosis of SARS-CoV-2 by FDA under an Emergency Use Authorization (EUA). This EUA will remain in effect (meaning this test can be used) for the duration of the COVID-19 declaration under Section 564(b)(1) of the  Act, 21 U.S.C. section 360bbb-3(b)(1), unless the authorization is terminated or revoked.     Resp Syncytial Virus by PCR NEGATIVE NEGATIVE Final    Comment: (NOTE) Fact Sheet for Patients: BloggerCourse.com  Fact Sheet for Healthcare Providers: SeriousBroker.it  This test is not yet approved or cleared by the [place]  FDA and has been authorized for detection and/or diagnosis of SARS-CoV-2 by FDA under an Emergency Use Authorization (EUA). This EUA will remain in effect (meaning this test can be used) for the duration of the COVID-19 declaration under Section 564(b)(1) of the Act, 21 U.S.C. section 360bbb-3(b)(1), unless the authorization is terminated or revoked.  Performed at [facility], [address], [place], [place] [address]       Radiology Studies: No results found.     LOS: 7 days    Aneita Keens, MD [facility] 04/22/2024, 11:30 AM   If 7PM-7AM, please contact night-coverage [email or web address]

## 2024-04-22 NOTE — Inpatient Diabetes Management (Signed)
 Inpatient Diabetes Program Recommendations  AACE/ADA: New Consensus Statement on Inpatient Glycemic Control (2015)  Target Ranges:  Prepandial:   less than 140 mg/dL      Peak postprandial:   less than 180 mg/dL (1-2 hours)      Critically ill patients:  140 - 180 mg/dL   Lab Results  Component Value Date   GLUCAP 391 (H) 04/22/2024   HGBA1C 9.7 (H) 04/01/2024    Review of Glycemic Control  Latest Reference Range & Units 04/20/24 16:28 04/20/24 19:50 04/21/24 01:41 04/21/24 02:11 04/21/24 04:00 04/21/24 07:27 04/21/24 12:00 04/21/24 16:56 04/21/24 20:28 04/22/24 02:20 04/22/24 07:53  Glucose-Capillary 70 - 99 mg/dL 098 (H) 119 (H) 53 (L) 271 (H) 264 (H) 387 (H) 255 (H) 249 (H) 312 (H) 204 (H) 391 (H)   Diabetes history: DM 1 Outpatient Diabetes medications:  Lantus  16 units bid Humslog 0-12 units tid with meals  Current orders for Inpatient glycemic control:  Novolog  0-9 units tid after meals, bedtime and 0200 Novolog  3 units tid with meals  Semglee  10 units daily  Inpatient Diabetes Program Recommendations:    Please consider reducing Novolog  correction to 0-6 units tid with meals and HS scale.  Also please consider changing Semglee  to 6 units bid.   Thanks,  Josefa Ni, RN, BC-ADM Inpatient Diabetes Coordinator Pager [phone]  (8a-5p)

## 2024-04-22 NOTE — Progress Notes (Signed)
 Physical Therapy Treatment Patient Details Name: Danielle Clarke MRN: [id number] DOB: 1941-08-28 Today's Date: 04/22/2024   History of Present Illness Pt is 83 yo female who presents 04/15/24 who presents to the ER via EMS from home after being discharged from SNF rehab 2 days ago with c/o chest pain and SOB. Pt recently admitted  04/01/24-04/04/24 for syncope, T12 compression fx, and dysphagia. In ED pt noted to become aphasic and blank stare, code stroke called. Chest CT negative for PE. Brain MRI shows "Mild T2 hyperintensity in the cerebral white matter and pons usually from chronic small vessel ischemia. Brain atrophy. No  acute infarct, acute hemorrhage, hydrocephalus, or collection." Pt refused recommended EEG. PMH significant for dysphagia, CAD, TAVR, bradycardia with PPM placement, HTN, HLD, DM1, ICH.    PT Comments  PTA returned to room to provide handouts for pt and daughter on some education previously given, and pt daughter asking for clarification on some recommendations. PTA discussed technique/frequency for supine HEP, reasoning for use of IS, handout on reducing risk of aspiration/asp PNA, and pressure relief strategies/frequency. PTA encouraged pt to wear prevalon boots as able, pt currently defers. Pt continues to benefit from PT services to progress toward functional mobility goals, plan to have supervising PT re-evaluate her next session as pt not currently tolerating EOB/OOB mobility and progressing slowly.     If plan is discharge home, recommend the following: Two people to help with walking and/or transfers;A lot of help with bathing/dressing/bathroom;Assistance with feeding;Help with stairs or ramp for entrance;Assist for transportation   Can travel by private vehicle     No  Equipment Recommendations  Other (comment);Rolling walker (2 wheels);BSC/3in1;Wheelchair (measurements PT);Wheelchair cushion (measurements PT);Hospital bed;Hoyer lift (likely to need PTAR transport home;  if she has STE home, may need to install a ramp)    Recommendations for Other Services       Precautions / Restrictions Precautions Precautions: Fall;Other (comment);Back Precaution Booklet Issued: Yes (comment) Recall of Precautions/Restrictions: Impaired Precaution/Restrictions Comments: back, has TLSO brace, family cannot find brace Required Braces or Orthoses: Spinal Brace Spinal Brace: Thoracolumbosacral orthotic Restrictions Weight Bearing Restrictions Per Provider Order: No     Mobility  Bed Mobility Overal bed mobility: Needs Assistance             General bed mobility comments: received sidelying toward her R, pt defers repositioning again during session    Transfers                   General transfer comment: Refused due to pain/discomfort    Ambulation/Gait                   Stairs             Wheelchair Mobility     Tilt Bed    Modified Rankin (Stroke Patients Only)       Balance Overall balance assessment: History of Falls     Sitting balance - Comments: pt refused EOB                                    Communication Communication Communication: No apparent difficulties  Cognition Arousal: Alert Behavior During Therapy: Flat affect   PT - Cognitive impairments: Difficult to assess, Safety/Judgement, Initiation, Orientation, Memory Difficult to assess due to:  (pt reluctant to answer a lot of questions) Orientation impairments: Time  PT - Cognition Comments: Pt does not appear oriented to time of day; per daughter, pt does not recall PTA coming to her room earlier to speak with her; possible sundowning? Pt appeared more oriented in AM but is following some simple commands this date. Following commands: Intact (for bed-level but limited assessment due to pt irritability with bed position changed)      Cueing Cueing Techniques: Verbal cues, Gestural cues  Exercises Other  Exercises Other Exercises: reviewed use of IS (handout provided), HEP handout for supine exercises (ankle pumps, heel slides, hip abduction, arm motions as tolerated along with freq/technique) with visual/verbal demo for daughter who will hopefully assist her with these to see if pt more able to tolerate therapies in future sessions once she is more used to them.    General Comments General comments (skin integrity, edema, etc.): daughter asking about lidocaine  patch, RN notified. see comments above for education provided. Handouts provided on preventing pressure injuries, aspiration PNA/reducing risk of aspiration via body positioning and handouts highlighted in relevant areas; also brought handout on back precs      Pertinent Vitals/Pain Pain Assessment Pain Assessment: PAINAD Breathing: normal Negative Vocalization: none Facial Expression: facial grimacing Body Language: tense, distressed pacing, fidgeting Consolability: distracted or reassured by voice/touch PAINAD Score: 4 Pain Location: sidelying toward her R with HOB lowered Pain Descriptors / Indicators: Discomfort, Guarding Pain Intervention(s): Limited activity within patient's tolerance, Monitored during session, Repositioned, Patient requesting pain meds-RN notified, Other (comment) (pt's daughter requesting RN bring lidocaine  patch for her back)    Home Living                          Prior Function            PT Goals (current goals can now be found in the care plan section) Acute Rehab PT Goals Patient Stated Goal: get better and return home to cat PT Goal Formulation: With patient/family Time For Goal Achievement: 04/30/24 Progress towards PT goals: Not progressing toward goals - comment;PT to reassess next treatment    Frequency    Min 2X/week      PT Plan      Co-evaluation              AM-PAC PT "6 Clicks" Mobility   Outcome Measure  Help needed turning from your back to your side  while in a flat bed without using bedrails?: Total Help needed moving from lying on your back to sitting on the side of a flat bed without using bedrails?: Total Help needed moving to and from a bed to a chair (including a wheelchair)?: Total Help needed standing up from a chair using your arms (e.g., wheelchair or bedside chair)?: Total Help needed to walk in hospital room?: Total Help needed climbing 3-5 steps with a railing? : Total 6 Click Score: 6    End of Session Equipment Utilized During Treatment: Oxygen Activity Tolerance: Treatment limited secondary to agitation;Other (comment) (lack of desire to participate in any self-care or mobility related activities.) Patient left: in bed;with call bell/phone within reach;with bed alarm set;with family/visitor present;Other (comment) (caregiver in room) Nurse Communication: Mobility status;Need for lift equipment;Precautions;Other (comment) (recommend prevalon boots and air bed given pt immobility) PT Visit Diagnosis: Muscle weakness (generalized) (M62.81);Difficulty in walking, not elsewhere classified (R26.2);Pain Pain - part of body:  (back)     Time: 6213-0865 PT Time Calculation (min) (ACUTE ONLY): 10 min  Charges:    $  Therapeutic Exercise: 8-22 mins $Therapeutic Activity: 8-22 mins PT General Charges $$ ACUTE PT VISIT: 1 Visit                     Charitie Hinote P., PTA Acute Rehabilitation Services Secure Chat Preferred 9a-5:30pm Office: [phone]    Mariel Shope [facility] 04/22/2024, 6:34 PM

## 2024-04-22 NOTE — Progress Notes (Signed)
 PT Cancellation Note  Patient Details Name: Danielle Clarke MRN: [id number] DOB: 09-18-1941   Cancelled Treatment:    Reason Eval/Treat Not Completed: (P) Patient declined, no reason specified (pt politely defers to participate in any mobility. PTA asking her if she would like to participate in therapies the following date or if she wants PT to sign off and pt states "I may participate".) Discussed with pt whether she is afraid of falls or having a similar concern and she states that this is not the case. Plan to re-attempt following date, and if pt again refuses to mobilize to EOB/OOB this will be 4 attempts with pt refusing to participate in any physical activity so may be appropriate to sign off at that time.   Jaiven Graveline M Myliyah Rebuck 04/22/2024, 1:48 PM

## 2024-04-22 NOTE — Progress Notes (Signed)
 Speech Language Pathology Treatment: Dysphagia  Patient Details Name: Danielle Clarke MRN: [id number] DOB: November 29, 1940 Today's Date: 04/22/2024 Time:  -     Assessment / Plan / Recommendation Clinical Impression  Pt seen for dysphagia with daughter and Dr Duard Getting present. Session limited today as pt only agreeable to one sip thin liquid, refusing additional sips or solids today stating "leave me alone" and turning head away. Daughter states she had a good day yesterday and appears to be tolerating the Dys 2 texture. Initially she stated she took more when she was on puree but didn't like it and is requesting Popeye shrimp and pizza. Pt ate bites of pizza yesterday that they cut into small pieces without difficulty masticating. Daughter in agreement to continue Dys 2. She also mentioned a feeding tube and had decided against this with MD agreeing to allow pt to eat what she is able. SLP reviewed esophageal findings on MBS with daughter and reiterated pt to remain upright after meals and to follow bites with sips with daughter verbalizing understanding. ST will continue to treat while in hospital. Palliative care is following.    HPI HPI: Danielle Clarke is a 83 y.o. female who presented to the ER via EMS from home after being discharged from SNF rehab 2 days ago with complaints of chest pain and shortness of breath. Chest CT 5/20: "Bilateral lower lobe tree-in-bud nodules,  right-greater-than-left, and subpleural right lower lobe  consolidation, likely aspiration with developing right lower lobe  pneumonia."  Pt with medical history significant for type 1 diabetes, hyperlipidemia, aortic stenosis status post TAVR in 2024, dysphagia (recently started on dysphagia 1 diet), recently admitted for syncope (04/01/2024-04/04/2024) when she was found to have T12 compression fracture. Hx of anterior cervical corpectomy C4-6 with fixation screws at C3 and C7 07/31/14. Has had two prior MBS studies, 02/11/19 and 04/07/22.  Most recent study noted esophageal stasis, retention in pharynx, inability to pass pill through PES.  Esophagram 04/06/2024 showed mild esophageal dysmotility; review of imaging shows practical implications of dysmotility are likely significant.      SLP Plan  Continue with current plan of care      Recommendations for follow up therapy are one component of a multi-disciplinary discharge planning process, led by the attending physician.  Recommendations may be updated based on patient status, additional functional criteria and insurance authorization.    Recommendations  Diet recommendations: Dysphagia 2 (fine chop);Thin liquid Liquids provided via: Cup;Straw Medication Administration: Crushed with puree Supervision: Staff to assist with self feeding Compensations: Slow rate;Small sips/bites;Follow solids with liquid Postural Changes and/or Swallow Maneuvers: Seated upright 90 degrees;Upright 30-60 min after meal      Patient may use Passy-Muir Speech Valve: with SLP only           Oral care BID   Frequent or constant Supervision/Assistance Dysphagia, pharyngeal phase (R13.13)     Continue with current plan of care     Danielle Clarke  04/22/2024, 10:55 AM

## 2024-04-23 DIAGNOSIS — E785 Hyperlipidemia, unspecified: Secondary | ICD-10-CM

## 2024-04-23 DIAGNOSIS — R54 Age-related physical debility: Secondary | ICD-10-CM

## 2024-04-23 DIAGNOSIS — R4701 Aphasia: Secondary | ICD-10-CM | POA: Diagnosis not present

## 2024-04-23 DIAGNOSIS — J189 Pneumonia, unspecified organism: Secondary | ICD-10-CM | POA: Diagnosis not present

## 2024-04-23 LAB — GLUCOSE, CAPILLARY
Glucose-Capillary: 207 mg/dL — ABNORMAL HIGH (ref 70–99)
Glucose-Capillary: 194 mg/dL — ABNORMAL HIGH (ref 70–99)
Glucose-Capillary: 200 mg/dL — ABNORMAL HIGH (ref 70–99)
Glucose-Capillary: 108 mg/dL — ABNORMAL HIGH (ref 70–99)

## 2024-04-23 MED ORDER — TRAMADOL HCL 50 MG PO TABS: 50.0000 mg | ORAL_TABLET | Freq: Four times a day (QID) | ORAL | Status: DC | PRN

## 2024-04-23 MED ORDER — METHOCARBAMOL 500 MG PO TABS: 500.0000 mg | ORAL_TABLET | Freq: Three times a day (TID) | ORAL | Status: DC | PRN

## 2024-04-23 MED ORDER — FENTANYL CITRATE PF 50 MCG/ML IJ SOSY
25.0000 ug | PREFILLED_SYRINGE | Freq: Once | INTRAMUSCULAR | Status: AC
  Administered 2024-04-23: 25 ug via INTRAVENOUS
  Filled 2024-04-23: qty 1

## 2024-04-23 MED ORDER — DICLOFENAC SODIUM 1 % EX GEL
4.0000 g | Freq: Four times a day (QID) | CUTANEOUS | Status: DC
  Administered 2024-04-23 – 2024-04-25 (×6): 4 g via TOPICAL
  Filled 2024-04-23: qty 100

## 2024-04-23 NOTE — Progress Notes (Signed)
 [facility]                                                                               Danielle Clarke, is a 83 y.o. female, DOB - [birth year], YQI:[id number] Admit date - 04/15/2024    Outpatient Primary MD for the patient is [name], [name], MD  LOS - 8  days    Brief summary   Danielle Clarke is a 83 y.o. 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  Patient presented secondary to chest pain and shortness of breath and was found to have evidence of pneumonia with associated acute hypoxia requiring supplemental oxygen.  Patient was started on antibiotics for management.  Chest pain workup was negative for cardiac etiology.   Assessment & Plan    Assessment and Plan:   Right lower lobe pneumonia Present on admission Continue with Levaquin  to complete the course of antibiotics   Acute respiratory failure with with hypoxia Secondary to pneumonia. Weaned to RA.    Type 1 diabetes Poorly controlled Hemoglobin A1c is 9.7% Resume insulin  regimen.    Dysphagia Continue with Dysphagia 2 diet.    Aphasia Appears to have been a transient episode.  Initially, code stroke was ordered.  Neurology consulted.  CT and MRI imaging without evidence of acute stroke, however MRI was significant for left frontal and bilateral anterior temporal encephalomalacia including cortex  EEG was recommended, however this was declined by the patient.  Symptoms resolved   Chest tightness Present on admission.  Initial troponin of 14 with delta of 9, not consistent with ACS.  Likely related to underlying pneumonia.  Improved.   Hyperlipidemia Aortic atherosclerosis Resume statin.    Chronic HFpEF Stable.   Abnormal CT chest finding CT chest significant for a 1.7 x 0.9 cm ovoid density within the right breast with recommendation for correlation with dedicated breast  imaging.  Patient will need to follow-up outpatient for this.   Enlarged thyroid  gland Noted on thyroid  ultrasound on 5/21. TSH normal. Small 1.5 cmTI-RADS category 3 nodule noted in the right gland, meeting criteria for image surveillance. Recommendation for follow-up ultrasound in 1 year.     RN Pressure Injury Documentation: Pressure Injury 04/02/24 Sacrum Bilateral Stage 2 -  Partial thickness loss of dermis presenting as a shallow open injury with a red, pink wound bed without slough. 7.5x5 pink/red open area in center (Active)  04/02/24 0848  Location: Sacrum  Location Orientation: Bilateral  Staging: Stage 2 -  Partial thickness loss of dermis presenting as a shallow open injury with a red, pink wound bed without slough.  Wound Description (Comments): 7.5x5 pink/red open area in center  Present on Admission: Yes  Dressing Type None 04/21/24 0840  Local wound care.   Estimated body mass index is 17.01 kg/m as calculated from the following:   Height as of this encounter: 5' (1.524 m).   Weight as of this encounter: 39.5 kg.  Code Status: DNR limited.  DVT Prophylaxis:  enoxaparin  (LOVENOX ) injection 30 mg Start: 04/15/24 2000   Level of Care: Level of care: Telemetry Medical Family  Communication: none at bedside.  Disposition Plan:     Remains inpatient appropriate:  Home hospice once all the equipment is delivered.   Procedures:  None.   Consultants:   Neurology Palliative care   Antimicrobials:   Anti-infectives (From admission, onward)    Start     Dose/Rate Route Frequency Ordered Stop   04/18/24 1400  levofloxacin  (LEVAQUIN ) tablet 750 mg        750 mg Oral Every 48 hours 04/18/24 1304 04/22/24 1359   04/17/24 1000  levofloxacin  (LEVAQUIN ) tablet 250 mg  Status:  Discontinued        250 mg Oral Daily 04/17/24 0858 04/17/24 1006   04/16/24 1000  aztreonam  (AZACTAM ) 2 g in sodium chloride  0.9 % 100 mL IVPB  Status:  Discontinued        2 g 200 mL/hr over 30  Minutes Intravenous Every 12 hours 04/16/24 0418 04/16/24 0509   04/16/24 0800  Levofloxacin  (LEVAQUIN ) IVPB 250 mg  Status:  Discontinued        250 mg 50 mL/hr over 60 Minutes Intravenous Every 24 hours 04/16/24 0511 04/17/24 0858   04/15/24 1945  aztreonam  (AZACTAM ) 2 g in sodium chloride  0.9 % 100 mL IVPB        2 g 200 mL/hr over 30 Minutes Intravenous  Once 04/15/24 1932 04/15/24 2015        Medications  Scheduled Meds:  (feeding supplement) PROSource Plus  30 mL Oral Daily   aspirin   81 mg Oral Daily   diclofenac  Sodium  4 g Topical QID   enoxaparin  (LOVENOX ) injection  30 mg Subcutaneous Q24H   feeding supplement (GLUCERNA SHAKE)  237 mL Oral TID BM   insulin  aspart  0-6 Units Subcutaneous TID WC   insulin  aspart  3 Units Subcutaneous TID WC   insulin  glargine-yfgn  6 Units Subcutaneous BID   levETIRAcetam  250 mg Oral BID   lidocaine   1 patch Transdermal Q24H   megestrol   20 mg Oral BID   metoprolol  tartrate  12.5 mg Oral Daily   simvastatin   40 mg Oral QHS   Continuous Infusions: PRN Meds:.acetaminophen , alum & mag hydroxide-simeth, methocarbamol, polyethylene glycol, prochlorperazine , traMADol , traZODone    Subjective:   Harris Kistler was seen and examined today.  Reports having pain in the arm. With some cramps. I added robaxin.  Objective:   Vitals:   04/22/24 2132 04/23/24 0043 04/23/24 0636 04/23/24 0900  BP: (!) 138/48 (!) 165/49 (!) 122/57 (!) 151/67  Pulse: 80 84 85   Resp:   18   Temp: 98.8 F (37.1 C)  98.1 F (36.7 C) 98.1 F (36.7 C)  TempSrc: Oral  Oral Oral  SpO2: 99% 100% 99% 98%  Weight:      Height:       No intake or output data in the 24 hours ending 04/23/24 1342 Filed Weights   04/15/24 1531  Weight: 39.5 kg     Exam General: Alert and oriented x 3, NAD Cardiovascular: S1 S2 auscultated, no murmurs, RRR Respiratory: Clear to auscultation bilaterally, no wheezing, rales or rhonchi Gastrointestinal: Soft, nontender,  nondistended, + bowel sounds Ext: no pedal edema bilaterally Neuro: AAOx3, Cr N's II- XII. Strength 5/5 upper and lower extremities bilaterally Skin: No rashes Psych: flat affect.    Data Reviewed:  I have personally reviewed following labs and imaging studies   CBC Lab Results  Component Value Date   WBC 5.6 04/17/2024   RBC 3.74 (L) 04/17/2024  HGB 10.7 (L) 04/17/2024   HCT 31.7 (L) 04/17/2024   MCV 84.8 04/17/2024   MCH 28.6 04/17/2024   PLT 253 04/17/2024   MCHC 33.8 04/17/2024   RDW 16.0 (H) 04/17/2024   LYMPHSABS 1.4 04/17/2024   MONOABS 0.4 04/17/2024   EOSABS 0.1 04/17/2024   BASOSABS 0.1 04/17/2024     Last metabolic panel Lab Results  Component Value Date   NA 136 04/17/2024   K 3.8 04/17/2024   CL 100 04/17/2024   CO2 27 04/17/2024   BUN 16 04/17/2024   CREATININE 0.76 04/22/2024   GLUCOSE 98 04/17/2024   GFRNONAA >60 04/22/2024   GFRAA >60 02/25/2019   CALCIUM 8.3 (L) 04/17/2024   PHOS 3.4 04/16/2024   PROT 5.6 (L) 04/15/2024   ALBUMIN 2.1 (L) 04/15/2024   LABGLOB 2.9 10/23/2023   AGRATIO 1.7 04/19/2023   BILITOT 0.4 04/15/2024   ALKPHOS 66 04/15/2024   AST 19 04/15/2024   ALT 23 04/15/2024   ANIONGAP 9 04/17/2024    CBG (last 3)  Recent Labs    04/22/24 1640 04/23/24 0950 04/23/24 1338  GLUCAP 403* 207* 194*      Coagulation Profile: No results for input(s): "INR", "PROTIME" in the last 168 hours.   Radiology Studies: No results found.     Feliciana Horn M.D. [facility] 04/23/2024, 1:42 PM  Available via Epic secure chat 7am-7pm After 7 pm, please refer to night coverage provider listed on amion.

## 2024-04-23 NOTE — Plan of Care (Signed)
  Problem: Coping: Goal: Ability to adjust to condition or change in health will improve Outcome: Progressing   Problem: Health Behavior/Discharge Planning: Goal: Ability to manage health-related needs will improve Outcome: Progressing   Problem: Metabolic: Goal: Ability to maintain appropriate glucose levels will improve Outcome: Progressing   Problem: Nutritional: Goal: Maintenance of adequate nutrition will improve Outcome: Progressing   Problem: Skin Integrity: Goal: Risk for impaired skin integrity will decrease Outcome: Progressing   Problem: Tissue Perfusion: Goal: Adequacy of tissue perfusion will improve Outcome: Progressing

## 2024-04-23 NOTE — Care Management Important Message (Signed)
 Important Message  Patient Details  Name: Danielle Clarke MRN: [id number] Date of Birth: 1941-01-29   Important Message Given:  Yes - Medicare IM     Wynonia Hedges 04/23/2024, 4:57 PM

## 2024-04-23 NOTE — Progress Notes (Signed)
 OT Cancellation Note  Patient Details Name: Danielle Clarke MRN: [id number] DOB: 1941/02/20   Cancelled Treatment:    Reason Eval/Treat Not Completed: (P) Other (comment), Pt going home with hospice, signing off.  Scherry Curtis 04/23/2024, 3:32 PM

## 2024-04-23 NOTE — Progress Notes (Signed)
 PT Cancellation Note  Patient Details Name: Danielle Clarke MRN: [id number] DOB: August 27, 1941   Cancelled Treatment:    Reason Eval/Treat Not Completed: Other (comment). Pt now to go home with hospice. Will sign off.    Pura Browns [facility] 04/23/2024, 2:27 PM Angelina Kempf PT Acute [employer] [phone]

## 2024-04-23 NOTE — TOC Progression Note (Signed)
 Transition of Care [facility]) - Progression Note    Patient Details  Name: Danielle Clarke MRN: [id number] Date of Birth: 1941/03/25  [facility] CM/SW Contact  Dane Dung, RN Phone Number: 04/23/2024, 11:16 AM  Clinical Narrative:    CM met with the patient's daughter to discuss TOC needs.  The patient's daughter states that she would like to take the patient home with hospice services and not have the patient return to [facility].  I spoke with the patient's daughter and offered Medicare choice regarding home hospice services and the daughter chose [facility].  I called and placed a referral with [facility] and updated the attending MD and palliative care provider, Armin Landing, NP.  Patient will need hospital bed, hoyer lift and bedside table for home.  Daughter was provided with resources for personal care services for assistance with 24 hour care at the home.  Patient currently has private pay aide at the home that family has been paying for out of pocket at the present.    CM will continue to follow the patient for [facility] needs to return home with hospice care.   Expected Discharge Plan: Skilled Nursing Facility    Expected Discharge Plan and Services                                               Social Determinants of Health (SDOH) Interventions SDOH Screenings   Food Insecurity: No Food Insecurity (04/16/2024)  Housing: Low Risk  (04/16/2024)  Transportation Needs: No Transportation Needs (04/16/2024)  Utilities: Not At Risk (04/16/2024)  Depression (PHQ2-9): Low Risk  (05/28/2019)  Social Connections: Unknown (04/16/2024)  Tobacco Use: Low Risk  (04/16/2024)    Readmission Risk Interventions    04/23/2024   11:16 AM 04/02/2024   11:26 AM 02/07/2023   12:07 PM  Readmission Risk Prevention Plan  Post Dischage Appt   Complete  Medication Screening   Complete  Transportation Screening Complete Complete Complete  PCP or  Specialist Appt within 5-7 Days Complete Complete   Home Care Screening Complete Complete   Medication Review (RN CM) Complete Referral to Pharmacy

## 2024-04-23 NOTE — Progress Notes (Signed)
 Palliative Medicine Progress Note   Patient Name: Danielle Clarke       Date: 04/23/2024 DOB: 1941/01/10  Age: 83 y.o. MRN#: [id number] Attending Physician: Feliciana Horn, MD Primary Care Physician: Elester Grim, MD Admit Date: 04/15/2024   HPI/Patient Profile: 83 y.o. 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 She presented to the ED on 04/15/2024 with chest pain and shortness of breath and is admitted with right lower lobe pneumonia and acute respiratory failure with hypoxia.    Palliative Medicine was consulted for goals of care.  Subjective: Chart reviewed. Notified by [facility] that daughter has agreed to home hospice.  Patient assessed at bedside. She is anxious, and reporting back and arm pain. Her daughter and caregiver are at bedside. They report patient has had chronic back pain for years. Discussed options for pain management - daughter is open to try low-dose opioids as long as they do not cause over-sedation.   Daughter confirms plan to have patient go home with hospice.  Reviewed that hospice is about supporting the patient where they are following the natural course to occur.  Discussed that hospice can help with personal care as well as symptom management. Questions and concerns addressed.  Emotional support provided.   Objective:  Physical Exam Vitals reviewed.  Constitutional:      General: She is not in acute distress.    Appearance: She is ill-appearing.     Comments: Frail appearing  Pulmonary:     Effort: Pulmonary effort is normal.  Neurological:     Mental Status: She is alert.     Motor: Weakness present.            Palliative  Medicine Assessment & Plan   Assessment: Principal Problem:   Pneumonia of right lower lobe due to infectious organism Acute respiratory failure with hypoxia Frailty syndrome Failure to thrive Type 1 diabetes, poorly controlled Dysphagia Chronic HFpEF   Recommendations/Plan: Continue current supportive interventions Fentanyl  25 mcg IV now x 1 Will consider starting fentanyl  patch Daughter confirms plan is for home with hospice PMT will continue to follow and support  Code Status: DNR - Limited   Prognosis:  < 6 months  Discharge Planning: Home with Hospice   Thank  you for allowing the Palliative Medicine Team to assist in the care of this patient.   MDM - High  Detailed review of medical records (labs, imaging, vital signs), medically appropriate exam, discussed with treatment team, counseling and education to patient, family, & staff, documenting clinical information, medication management, coordination of care.    Brayen Bunn B Ravindra Baranek, NP   Please contact Palliative Medicine Team phone at [phone] for questions and concerns.  For individual providers, please see AMION.

## 2024-04-24 DIAGNOSIS — I10 Essential (primary) hypertension: Secondary | ICD-10-CM

## 2024-04-24 DIAGNOSIS — J189 Pneumonia, unspecified organism: Secondary | ICD-10-CM | POA: Diagnosis not present

## 2024-04-24 LAB — GLUCOSE, CAPILLARY
Glucose-Capillary: 142 mg/dL — ABNORMAL HIGH (ref 70–99)
Glucose-Capillary: 149 mg/dL — ABNORMAL HIGH (ref 70–99)
Glucose-Capillary: 244 mg/dL — ABNORMAL HIGH (ref 70–99)
Glucose-Capillary: 287 mg/dL — ABNORMAL HIGH (ref 70–99)

## 2024-04-24 NOTE — TOC Progression Note (Signed)
 Transition of Care [facility]) - Progression Note    Patient Details  Name: Danielle Clarke MRN: [id number] Date of Birth: 12-29-1940  [facility] CM/SW Contact  Dane Dung, RN Phone Number: 04/24/2024, 1:47 PM  Clinical Narrative:    CM called and spoke with patient's daughter by phone and the hospital bed, hoyer lift and bedside table should be delivered to the home before 6 pm tonight.  Patient's daughter has 24 hour care arranged starting tomorrow.  Daughter plans to fill out FMLA paperwork and have her PCP sign.  Patient will need [facility] transport to home tomorrow.  MD aware and plans to sign DNR as well.   Expected Discharge Plan: Skilled Nursing Facility    Expected Discharge Plan and Services                                               Social Determinants of Health (SDOH) Interventions SDOH Screenings   Food Insecurity: No Food Insecurity (04/16/2024)  Housing: Low Risk  (04/16/2024)  Transportation Needs: No Transportation Needs (04/16/2024)  Utilities: Not At Risk (04/16/2024)  Depression (PHQ2-9): Low Risk  (05/28/2019)  Social Connections: Unknown (04/16/2024)  Tobacco Use: Low Risk  (04/16/2024)    Readmission Risk Interventions    04/23/2024   11:16 AM 04/02/2024   11:26 AM 02/07/2023   12:07 PM  Readmission Risk Prevention Plan  Post Dischage Appt   Complete  Medication Screening   Complete  Transportation Screening Complete Complete Complete  PCP or Specialist Appt within 5-7 Days Complete Complete   Home Care Screening Complete Complete   Medication Review (RN CM) Complete Referral to Pharmacy

## 2024-04-24 NOTE — Plan of Care (Signed)

## 2024-04-24 NOTE — Progress Notes (Signed)
                                                                                                                                                                                                          Palliative Medicine Progress Note   Patient Name: Danielle Clarke       Date: 04/24/2024 DOB: 04-04-1941  Age: 83 y.o. MRN#: [id number] Attending Physician: Feliciana Horn, MD Primary Care Physician: Elester Grim, MD Admit Date: 04/15/2024  Reason for Consultation/Follow-up: {Reason for Consult:23484}  HPI/Patient Profile: 83 y.o. 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 She presented to the ED on 04/15/2024 with chest pain and shortness of breath and is admitted with right lower lobe pneumonia and acute respiratory failure with hypoxia.    Palliative Medicine was consulted for goals of care.  Subjective: ***  Objective:  Physical Exam          Vital Signs: BP (!) 119/40 (BP Location: Left Arm)   Pulse 81   Temp 98.2 F (36.8 C)   Resp 19   Ht 5' (1.524 m)   Wt 39.5 kg   SpO2 100%   BMI 17.01 kg/m  SpO2: SpO2: 100 % O2 Device: O2 Device: Room Air O2 Flow Rate: O2 Flow Rate (L/min): 1 L/min  Intake/output summary: No intake or output data in the 24 hours ending 04/24/24 1438  LBM: Last BM Date : 04/22/24     Palliative Assessment/Data: ***     Palliative Medicine Assessment & Plan   Assessment: Principal Problem:   Pneumonia of right lower lobe due to infectious organism    Recommendations/Plan: ***  Goals of Care and Additional Recommendations: Limitations on Scope of Treatment: {Recommended Scope and Preferences:21019}  Code Status:   Prognosis:  {Palliative Care Prognosis:23504}  Discharge Planning: {Palliative dispostion:23505}  Care plan was discussed with ***  Thank you for allowing the Palliative Medicine Team to  assist in the care of this patient.   ***   Wynetta Heckle, NP   Please contact Palliative Medicine Team phone at [phone] for questions and concerns.  For individual providers, please see AMION.

## 2024-04-24 NOTE — Progress Notes (Signed)
 [facility]                                                                               Danielle Clarke, is a 83 y.o. female, DOB - May 08, 1941, BJY:[id number] Admit date - 04/15/2024    Outpatient Primary MD for the patient is [name], [name], MD  LOS - 9  days    Brief summary   Danielle Clarke is a 83 y.o. 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  Patient presented secondary to chest pain and shortness of breath and was found to have evidence of pneumonia with associated acute hypoxia requiring supplemental oxygen.  Patient was started on antibiotics and she was weaned off oxygen.    Assessment & Plan    Assessment and Plan:   Right lower lobe pneumonia Present on admission Completed the course of antibiotics.    Acute respiratory failure with with hypoxia Secondary to pneumonia. Weaned to RA.    Type 1 diabetes Poorly controlled Hemoglobin A1c is 9.7% Resume insulin  regimen.  CBG (last 3)  Recent Labs    04/23/24 2136 04/24/24 0831 04/24/24 1209  GLUCAP 108* 142* 149*      Dysphagia Continue with Dysphagia 2 diet.    Aphasia Appears to have been a transient episode.  Initially, code stroke was ordered.  Neurology consulted.  CT and MRI imaging without evidence of acute stroke, however MRI was significant for left frontal and bilateral anterior temporal encephalomalacia including cortex  EEG was recommended, however this was declined by the patient.  Symptoms resolved   Chest tightness Present on admission.  Initial troponin of 14 with delta of 9, not consistent with ACS.  Likely related to underlying pneumonia.   No chest pain today.    Hyperlipidemia Aortic atherosclerosis Resume statin.    H/o aortic stenosis status post TAVR,    Chronic HFpEF Stable.   Abnormal CT chest finding CT chest significant for a 1.7 x 0.9  cm ovoid density within the right breast with recommendation for correlation with dedicated breast imaging.  Patient will need to follow-up outpatient for this.   Enlarged thyroid  gland Noted on thyroid  ultrasound on 5/21. TSH normal. Small 1.5 cmTI-RADS category 3 nodule noted in the right gland, meeting criteria for image surveillance. Recommendation for follow-up ultrasound in 1 year.     RN Pressure Injury Documentation: Pressure Injury 04/02/24 Sacrum Bilateral Stage 2 -  Partial thickness loss of dermis presenting as a shallow open injury with a red, pink wound bed without slough. 7.5x5 pink/red open area in center (Active)  04/02/24 0848  Location: Sacrum  Location Orientation: Bilateral  Staging: Stage 2 -  Partial thickness loss of dermis presenting as a shallow open injury with a red, pink wound bed without slough.  Wound Description (Comments): 7.5x5 pink/red open area in center  Present on Admission: Yes  Dressing Type None 04/21/24 0840  Local wound care.   Estimated body mass index is 17.01 kg/m as calculated from the following:   Height as of this encounter: 5' (1.524 m).   Weight as of this encounter:  39.5 kg.  Code Status: DNR limited.  DVT Prophylaxis:  enoxaparin  (LOVENOX ) injection 30 mg Start: 04/15/24 2000   Level of Care: Level of care: Telemetry Medical Family Communication: none at bedside.  Disposition Plan:     Remains inpatient appropriate:  Home hospice once all the equipment is delivered.   Procedures:  None.   Consultants:   Neurology Palliative care   Antimicrobials:   Anti-infectives (From admission, onward)    Start     Dose/Rate Route Frequency Ordered Stop   04/18/24 1400  levofloxacin  (LEVAQUIN ) tablet 750 mg        750 mg Oral Every 48 hours 04/18/24 1304 04/22/24 1359   04/17/24 1000  levofloxacin  (LEVAQUIN ) tablet 250 mg  Status:  Discontinued        250 mg Oral Daily 04/17/24 0858 04/17/24 1006   04/16/24 1000  aztreonam   (AZACTAM ) 2 g in sodium chloride  0.9 % 100 mL IVPB  Status:  Discontinued        2 g 200 mL/hr over 30 Minutes Intravenous Every 12 hours 04/16/24 0418 04/16/24 0509   04/16/24 0800  Levofloxacin  (LEVAQUIN ) IVPB 250 mg  Status:  Discontinued        250 mg 50 mL/hr over 60 Minutes Intravenous Every 24 hours 04/16/24 0511 04/17/24 0858   04/15/24 1945  aztreonam  (AZACTAM ) 2 g in sodium chloride  0.9 % 100 mL IVPB        2 g 200 mL/hr over 30 Minutes Intravenous  Once 04/15/24 1932 04/15/24 2015        Medications  Scheduled Meds:  (feeding supplement) PROSource Plus  30 mL Oral Daily   aspirin   81 mg Oral Daily   diclofenac  Sodium  4 g Topical QID   enoxaparin  (LOVENOX ) injection  30 mg Subcutaneous Q24H   feeding supplement (GLUCERNA SHAKE)  237 mL Oral TID BM   insulin  aspart  0-6 Units Subcutaneous TID WC   insulin  aspart  3 Units Subcutaneous TID WC   insulin  glargine-yfgn  6 Units Subcutaneous BID   levETIRAcetam  250 mg Oral BID   lidocaine   1 patch Transdermal Q24H   megestrol   20 mg Oral BID   metoprolol  tartrate  12.5 mg Oral Daily   simvastatin   40 mg Oral QHS   Continuous Infusions: PRN Meds:.acetaminophen , alum & mag hydroxide-simeth, methocarbamol, polyethylene glycol, prochlorperazine , traMADol , traZODone    Subjective:   Danielle Clarke was seen and examined today.  Sleepy, no new complaints.  Objective:   Vitals:   04/23/24 2100 04/23/24 2300 04/24/24 0400 04/24/24 0732  BP: (!) 125/51 (!) 123/52 (!) 160/54 (!) 119/40  Pulse: 82 87 86 81  Resp:    19  Temp: 98.4 F (36.9 C)  98 F (36.7 C) 98.2 F (36.8 C)  TempSrc: Oral  Oral   SpO2: 97% 99% 100% 100%  Weight:      Height:       No intake or output data in the 24 hours ending 04/24/24 1542 Filed Weights   04/15/24 1531  Weight: 39.5 kg     Exam General exam: Appears calm and comfortable  Respiratory system: Clear to auscultation. Respiratory effort normal. Cardiovascular system: S1 & S2  heard, RRR. Gastrointestinal system: Abdomen is nondistended, soft and nontender. Central nervous system: Alert and oriented to person and place.  Extremities: Symmetric 5 x 5 power. Skin: No rashes,  Psychiatry: flat affect.    Data Reviewed:  I have personally reviewed following labs and imaging  studies   CBC Lab Results  Component Value Date   WBC 5.6 04/17/2024   RBC 3.74 (L) 04/17/2024   HGB 10.7 (L) 04/17/2024   HCT 31.7 (L) 04/17/2024   MCV 84.8 04/17/2024   MCH 28.6 04/17/2024   PLT 253 04/17/2024   MCHC 33.8 04/17/2024   RDW 16.0 (H) 04/17/2024   LYMPHSABS 1.4 04/17/2024   MONOABS 0.4 04/17/2024   EOSABS 0.1 04/17/2024   BASOSABS 0.1 04/17/2024     Last metabolic panel Lab Results  Component Value Date   NA 136 04/17/2024   K 3.8 04/17/2024   CL 100 04/17/2024   CO2 27 04/17/2024   BUN 16 04/17/2024   CREATININE 0.76 04/22/2024   GLUCOSE 98 04/17/2024   GFRNONAA >60 04/22/2024   GFRAA >60 02/25/2019   CALCIUM 8.3 (L) 04/17/2024   PHOS 3.4 04/16/2024   PROT 5.6 (L) 04/15/2024   ALBUMIN 2.1 (L) 04/15/2024   LABGLOB 2.9 10/23/2023   AGRATIO 1.7 04/19/2023   BILITOT 0.4 04/15/2024   ALKPHOS 66 04/15/2024   AST 19 04/15/2024   ALT 23 04/15/2024   ANIONGAP 9 04/17/2024    CBG (last 3)  Recent Labs    04/23/24 2136 04/24/24 0831 04/24/24 1209  GLUCAP 108* 142* 149*      Coagulation Profile: No results for input(s): "INR", "PROTIME" in the last 168 hours.   Radiology Studies: No results found.     Danielle Clarke M.D. [facility] 04/24/2024, 3:42 PM  Available via Epic secure chat 7am-7pm After 7 pm, please refer to night coverage provider listed on amion.

## 2024-04-24 NOTE — Plan of Care (Signed)
  Problem: Coping: Goal: Ability to adjust to condition or change in health will improve Outcome: Progressing   Problem: Fluid Volume: Goal: Ability to maintain a balanced intake and output will improve Outcome: Progressing   Problem: Health Behavior/Discharge Planning: Goal: Ability to identify and utilize available resources and services will improve Outcome: Progressing   Problem: Metabolic: Goal: Ability to maintain appropriate glucose levels will improve Outcome: Progressing   Problem: Nutritional: Goal: Maintenance of adequate nutrition will improve Outcome: Progressing   

## 2024-04-25 DIAGNOSIS — N183 Chronic kidney disease, stage 3 unspecified: Secondary | ICD-10-CM | POA: Diagnosis not present

## 2024-04-25 DIAGNOSIS — Z950 Presence of cardiac pacemaker: Secondary | ICD-10-CM | POA: Diagnosis not present

## 2024-04-25 DIAGNOSIS — G40909 Epilepsy, unspecified, not intractable, without status epilepticus: Secondary | ICD-10-CM | POA: Diagnosis not present

## 2024-04-25 DIAGNOSIS — E108 Type 1 diabetes mellitus with unspecified complications: Secondary | ICD-10-CM | POA: Diagnosis not present

## 2024-04-25 DIAGNOSIS — I350 Nonrheumatic aortic (valve) stenosis: Secondary | ICD-10-CM | POA: Diagnosis not present

## 2024-04-25 DIAGNOSIS — Z952 Presence of prosthetic heart valve: Secondary | ICD-10-CM | POA: Diagnosis not present

## 2024-04-25 DIAGNOSIS — I10 Essential (primary) hypertension: Secondary | ICD-10-CM | POA: Diagnosis not present

## 2024-04-25 DIAGNOSIS — J189 Pneumonia, unspecified organism: Secondary | ICD-10-CM | POA: Diagnosis not present

## 2024-04-25 DIAGNOSIS — I679 Cerebrovascular disease, unspecified: Secondary | ICD-10-CM | POA: Diagnosis not present

## 2024-04-25 DIAGNOSIS — E43 Unspecified severe protein-calorie malnutrition: Secondary | ICD-10-CM | POA: Diagnosis not present

## 2024-04-25 LAB — GLUCOSE, CAPILLARY
Glucose-Capillary: 148 mg/dL — ABNORMAL HIGH (ref 70–99)
Glucose-Capillary: 162 mg/dL — ABNORMAL HIGH (ref 70–99)
Glucose-Capillary: 185 mg/dL — ABNORMAL HIGH (ref 70–99)

## 2024-04-25 MED ORDER — MORPHINE SULFATE (CONCENTRATE) 10 MG /0.5 ML PO SOLN: 5.0000 mg | ORAL | Status: DC | PRN

## 2024-04-25 MED ORDER — LEVETIRACETAM 250 MG PO TABS: 250.0000 mg | ORAL_TABLET | Freq: Two times a day (BID) | ORAL | 0 refills | Status: AC

## 2024-04-25 MED ORDER — INSULIN GLARGINE-YFGN 100 UNIT/ML ~~LOC~~ SOLN: 6.0000 [IU] | Freq: Two times a day (BID) | SUBCUTANEOUS | 11 refills | Status: DC

## 2024-04-25 MED ORDER — DICLOFENAC SODIUM 1 % EX GEL: 4.0000 g | Freq: Four times a day (QID) | CUTANEOUS | 0 refills | Status: DC

## 2024-04-25 MED ORDER — POLYETHYLENE GLYCOL 3350 17 G PO PACK: 17.0000 g | PACK | Freq: Every day | ORAL | 0 refills | Status: DC | PRN

## 2024-04-25 MED ORDER — GLUCERNA SHAKE PO LIQD: 237.0000 mL | Freq: Three times a day (TID) | ORAL | 2 refills | Status: AC

## 2024-04-25 NOTE — Discharge Summary (Signed)
 Physician Discharge Summary   Patient: Danielle Clarke MRN: [id number] DOB: 05/18/1941  Admit date:     04/15/2024  Discharge date: 04/25/24  Discharge Physician: Feliciana Horn   PCP: Elester Grim, MD   Recommendations at discharge:  Please follow up with hospice MD as recommended.  Recommend outpatient follow up with mammogram as soon as possible.  Recommendation for follow-up ultrasound of the thyroid  in 1 year.   Discharge Diagnoses: Principal Problem:   Pneumonia of right lower lobe due to infectious organism    Hospital Course: Danielle Clarke is a 83 y.o. Danielle with a history of diabetes mellitus type 1, hyperlipidemia, aortic stenosis status post TAVR, dysphagia secondary to esophageal dysmotility.  Patient presented secondary to chest pain and shortness of breath and was found to have evidence of pneumonia with associated acute hypoxia requiring supplemental oxygen.  Patient was started on antibiotics and she was weaned off oxygen.     Assessment and Plan:    Right lower lobe pneumonia Present on admission Completed the course of antibiotics.      Acute respiratory failure with with hypoxia Secondary to pneumonia. Weaned to RA.      Type 1 diabetes Poorly controlled Hemoglobin A1c is 9.7% Resume insulin  regimen.  CBG (last 3)  Recent Labs (last 2 labs)       Recent Labs    04/23/24 2136 04/24/24 0831 04/24/24 1209  GLUCAP 108* 142* 149*            Dysphagia Continue with Dysphagia 2 diet.      Aphasia Appears to have been a transient episode.  Initially, code stroke was ordered.  Neurology consulted.  CT and MRI imaging without evidence of acute stroke, however MRI was significant for left frontal and bilateral anterior temporal encephalomalacia including cortex  EEG was recommended, however this was declined by the patient.  Symptoms resolved   Chest tightness Present on admission.  Initial troponin of 14, 9 not consistent with ACS.   Likely related to underlying pneumonia.   No chest pain today.  EKG shows NSR with st t wave abnormality, . At this time patient's daughter wanted home hospice. No aggressive interventions at this time.  Recommend to continue with aspirin  81 mg daily.      Hyperlipidemia Aortic atherosclerosis Resume statin.      H/o aortic stenosis status post TAVR,    Chronic HFpEF Stable.   Abnormal CT chest finding CT chest significant for a 1.7 x 0.9 cm ovoid density within the right breast with recommendation for correlation with dedicated breast imaging.  Patient will need to follow-up outpatient for this.   Enlarged thyroid  gland Noted on thyroid  ultrasound on 5/21. TSH normal. Small 1.5 cmTI-RADS category 3 nodule noted in the right gland, meeting criteria for image surveillance. Recommendation for follow-up ultrasound in 1 year.       RN Pressure Injury Documentation:     Pressure Injury 04/02/24 Sacrum Bilateral Stage 2 -  Partial thickness loss of dermis presenting as a shallow open injury with a red, pink wound bed without slough. 7.5x5 pink/red open area in center (Active)  04/02/24 0848  Location: Sacrum  Location Orientation: Bilateral  Staging: Stage 2 -  Partial thickness loss of dermis presenting as a shallow open injury with a red, pink wound bed without slough.  Wound Description (Comments): 7.5x5 pink/red open area in center  Present on Admission: Yes  Dressing Type None 04/21/24 0840  Local wound care.  Estimated body mass index is 17.01 kg/m as calculated from the following:   Height as of this encounter: 5' (1.524 m).   Weight as of this encounter: 39.5 kg.          Consultants: neurology, palliative care.  Procedures performed: MRI brain  Disposition: Hospice care Diet recommendation:  Carb modified diet DISCHARGE MEDICATION: Allergies as of 04/25/2024       Reactions   Augmentin [amoxicillin-pot Clavulanate] Anaphylaxis   Ceclor [cefaclor]  Anaphylaxis   Tolerated cephalexin in 2018   Firvanq [vancomycin] Anaphylaxis   Sumycin [tetracycline] Anaphylaxis   Accupril  [quinapril  Hcl] Other (See Comments)   Hyperkalemia   Bactrim  [sulfamethoxazole -trimethoprim ] Other (See Comments)   CKD - decreased kidney function   Fosamax [alendronate Sodium] Other (See Comments)   Unknown reaction   Nsaids Other (See Comments)   CKD   Deltasone [prednisone] Other (See Comments)   Unknown reaction        Medication List     STOP taking these medications    clindamycin  300 MG capsule Commonly known as: CLEOCIN    insulin  glargine 100 UNIT/ML Solostar Pen Commonly known as: LANTUS        TAKE these medications    aspirin  EC 81 MG tablet Take 1 tablet (81 mg total) by mouth daily. Swallow whole.   dextrose  40 % Gel Commonly known as: GLUTOSE Take 15 g by mouth See admin instructions. Give 15g by mouth every hour as needed for hypoglycemia - recheck blood sugar in 30 minutes.   diclofenac  Sodium 1 % Gel Commonly known as: VOLTAREN  Apply 4 g topically 4 (four) times daily.   feeding supplement (GLUCERNA SHAKE) Liqd Take 237 mLs by mouth 3 (three) times daily between meals.   insulin  glargine-yfgn 100 UNIT/ML injection Commonly known as: SEMGLEE  Inject 0.06 mLs (6 Units total) into the skin 2 (two) times daily.   insulin  lispro 100 UNIT/ML injection Commonly known as: HUMALOG Inject 0-12 Units into the skin See admin instructions. Inject 0-12 units subcutaneously before meals and at bedtime per sliding scale: < 70 : contact provider 70-200 : 0 units 201-250 : 2 units 251-300 : 4 units 301-350 : 6 units 351-400 : 8 units 401-450 : 10 units 451-600 : 12 units - recheck blood sugar in 2 hours, if BS > 350 or < 100, notify provider.   levETIRAcetam 250 MG tablet Commonly known as: KEPPRA Take 1 tablet (250 mg total) by mouth 2 (two) times daily.   lidocaine  5 % Commonly known as: LIDODERM  Place 1 patch onto the  skin daily as needed (lower back pain).   megestrol  40 MG tablet Commonly known as: MEGACE  Take 20 mg by mouth 2 (two) times daily.   methocarbamol 750 MG tablet Commonly known as: ROBAXIN Take 750 mg by mouth 4 (four) times daily.   metoprolol  tartrate 25 MG tablet Commonly known as: LOPRESSOR  Take 1 tablet (25 mg total) by mouth as directed for 1 dose.   polyethylene glycol 17 g packet Commonly known as: MIRALAX  / GLYCOLAX  Take 17 g by mouth daily as needed for mild constipation.   Pro-Stat AWC Liqd Take 30 mLs by mouth daily.   simvastatin  40 MG tablet Commonly known as: ZOCOR  Take 40 mg by mouth at bedtime.   traMADol  50 MG tablet Commonly known as: ULTRAM  Take 1 tablet (50 mg total) by mouth every 6 (six) hours as needed.               Discharge Care  Instructions  (From admission, onward)           Start     Ordered   04/25/24 0000  Discharge wound care:       Comments: Partial thickness loss of dermis presenting as a shallow open injury with a red, pink wound bed without slough. 7.5x5 pink/red open area in center. Local wound care.   04/25/24 1106            Contact information for follow-up providers     [facility] Follow up.   Specialty: Hospice and Palliative Medicine Contact information: [address] [place]  [address] [phone]             Contact information for after-discharge care     Destination     [facility], [facility] .   Service: Skilled Nursing Contact information: [address] [place]  [address] [phone]                    Discharge Exam: Danielle Clarke Weights   04/15/24 1531  Weight: 39.5 kg   General exam: Ill appearing lady, not in distress.  Respiratory system: Clear to auscultation. Respiratory effort normal. Cardiovascular system: S1 & S2 heard, RRR.  Gastrointestinal system: Abdomen is nondistended, soft and  nontender. Central nervous system: Alert  follows simple commands.  Extremities:warm extremities.  Skin: No rashes,  Psychiatry: flat affect.    Condition at discharge: poor  The results of significant diagnostics from this hospitalization (including imaging, microbiology, ancillary and laboratory) are listed below for reference.   Imaging Studies: DG Swallowing Func-Speech Pathology Result Date: 04/16/2024 Table formatting from the original result was not included. Modified Barium Swallow Study Patient Details Name: Danielle Clarke MRN: [id number] Date of Birth: [birth year] Today's Date: 04/16/2024 HPI/PMH: HPI: Danielle Clarke is a 83 y.o. Danielle who presented to the ER via EMS from home after being discharged from SNF rehab 2 days ago with complaints of chest pain and shortness of breath. Chest CT 5/20: "Bilateral lower lobe tree-in-bud nodules,  right-greater-than-left, and subpleural right lower lobe  consolidation, likely aspiration with developing right lower lobe  pneumonia."  Pt with medical history significant for type 1 diabetes, hyperlipidemia, aortic stenosis status post TAVR in 2024, dysphagia (recently started on dysphagia 1 diet), recently admitted for syncope (04/01/2024-04/04/2024) when she was found to have T12 compression fracture. Hx of anterior cervical corpectomy C4-6 with fixation screws at C3 and C7 07/31/14. Has had two prior MBS studies, 02/11/19 and 04/07/22. Most recent study noted esophageal stasis, retention in pharynx, inability to pass pill through PES.  Esophagram 04/06/2024 showed mild esophageal dysmotility; review of imaging shows practical implications of dysmotility are likely significant. Clinical Impression: Clinical Impression: Pt presents with a mild pharyngeal dysphagia with a DIGEST score of 2.  Oral phase is generally functional with mildly prolonged oral preparation of solids. Most notable is cervical hardware from 2015 surgery that impacts full inversion of  epiglottis over the larynx and leads to retention of solid foods between epiglottis and base of tongue.  There is a narrowing of the pharyngeal space and PES opening. Barium was observed to be retained within the esophagus and occasionally backflowed through the PES and sat in the pyrforms.  There was effort on pt's part to transfer solids through the pharynx, occasionally bringing material back into oral cavity to propel it again. Thin and nectars spilled into the larynx intermittently, but pt had sufficient squeeze between arytenoids  and base of epiglottis to protect the airway. There was no aspiration on today's study.  Aspiration, if it is occurring, is likely related to backflow and retention of material in esophagus.  Notes indicate pt has been on pureed diets/thin liquids - SLP will follow to determine if diet can be advanced.  Pills must be crushed - 2023 study revealed 13 mm barium pill lodged at the the entrance of the esophagus and was expectorated. DIGEST Swallow Severity Rating*  Safety: 1  Efficiency: 2  Overall Pharyngeal Swallow Severity: 2 1: mild; 2: moderate; 3: severe; 4: profound *The Dynamic Imaging Grade of Swallowing Toxicity is standardized for the head and neck cancer population, however, demonstrates promising clinical applications across populations to standardize the clinical rating of pharyngeal swallow safety and severity. Factors that may increase risk of adverse event in presence of aspiration Roderick Civatte & Jessy Morocco 2021): Factors that may increase risk of adverse event in presence of aspiration Roderick Civatte & Jessy Morocco 2021): Frail or deconditioned; Respiratory or GI disease; Limited mobility; Weak cough Recommendations/Plan: Swallowing Evaluation Recommendations Swallowing Evaluation Recommendations Recommendations: PO diet PO Diet Recommendation: Dysphagia 1 (Pureed); Thin liquids (Level 0) Liquid Administration via: Cup; Straw Medication Administration: Crushed with puree Supervision: Staff  to assist with self-feeding Postural changes: Stay upright 30-60 min after meals Oral care recommendations: Oral care BID (2x/day) Treatment Plan Treatment Plan Treatment recommendations: Therapy as outlined in treatment plan below Follow-up recommendations: No SLP follow up Functional status assessment: Patient has had a recent decline in their functional status and demonstrates the ability to make significant improvements in function in a reasonable and predictable amount of time. Treatment frequency: Min 2x/week Treatment duration: 1 week Interventions: Aspiration precaution training; Patient/family education; Trials of upgraded texture/liquids Recommendations Recommendations for follow up therapy are one component of a multi-disciplinary discharge planning process, led by the attending physician.  Recommendations may be updated based on patient status, additional functional criteria and insurance authorization. Assessment: Orofacial Exam: Orofacial Exam Oral Cavity - Dentition: Adequate natural dentition Orofacial Anatomy: WFL Oral Motor/Sensory Function: WFL Anatomy: Anatomy: Presence of cervical hardware Boluses Administered: Boluses Administered Boluses Administered: Thin liquids (Level 0); Mildly thick liquids (Level 2, nectar thick); Puree; Solid  Oral Impairment Domain: Oral Impairment Domain Lip Closure: No labial escape Tongue control during bolus hold: Cohesive bolus between tongue to palatal seal Bolus preparation/mastication: Slow prolonged chewing/mashing with complete recollection Bolus transport/lingual motion: Brisk tongue motion Oral residue: Trace residue lining oral structures Initiation of pharyngeal swallow : Pyriform sinuses  Pharyngeal Impairment Domain: Pharyngeal Impairment Domain Soft palate elevation: No bolus between soft palate (SP)/pharyngeal wall (PW) Laryngeal elevation: Partial superior movement of thyroid  cartilage/partial approximation of arytenoids to epiglottic petiole  Anterior hyoid excursion: Complete anterior movement Epiglottic movement: Partial inversion Laryngeal vestibule closure: Incomplete, narrow column air/contrast in laryngeal vestibule Pharyngeal stripping wave : Present - diminished Pharyngeal contraction (A/P view only): N/A Pharyngoesophageal segment opening: Partial distention/partial duration, partial obstruction of flow Tongue base retraction: Narrow column of contrast or air between tongue base and PPW Pharyngeal residue: Collection of residue within or on pharyngeal structures Location of pharyngeal residue: Valleculae; Pharyngeal wall  Esophageal Impairment Domain: Esophageal Impairment Domain Esophageal clearance upright position: Esophageal retention with retrograde flow through the PES Pill: Pill Consistency administered: -- (NT) Penetration/Aspiration Scale Score: Penetration/Aspiration Scale Score 1.  Material does not enter airway: Puree; Solid 2.  Material enters airway, remains ABOVE vocal cords then ejected out: Thin liquids (Level 0) 3.  Material enters airway, remains ABOVE vocal  cords and not ejected out: Mildly thick liquids (Level 2, nectar thick) Compensatory Strategies: Compensatory Strategies Compensatory strategies: Yes Straw: Effective   General Information: Caregiver present: No  Diet Prior to this Study: Dysphagia 1 (pureed); Thin liquids (Level 0)   Temperature : Normal   No data recorded  Supplemental O2: Nasal cannula   History of Recent Intubation: No  Behavior/Cognition: Alert; Cooperative No data recorded Baseline vocal quality/speech: Normal No data recorded No data recorded Exam Limitations: No limitations Goal Planning: Prognosis for improved oropharyngeal function: Guarded No data recorded No data recorded Patient/Family Stated Goal: not stated No data recorded Pain: Pain Assessment Pain Assessment: Faces Faces Pain Scale: 2 Pain Location: generalized with mobility Pain Descriptors / Indicators: Discomfort Pain Intervention(s):  Limited activity within patient's tolerance; Monitored during session End of Session: Start Time:SLP Start Time (ACUTE ONLY): 1505 Stop Time: SLP Stop Time (ACUTE ONLY): 1530 Time Calculation:SLP Time Calculation (min) (ACUTE ONLY): 25 min Charges: SLP Evaluations $ SLP Speech Visit: 1 Visit SLP Evaluations $BSS Swallow: 1 Procedure $MBS Swallow: 1 Procedure $Swallowing Treatment: 1 Procedure SLP visit diagnosis: SLP Visit Diagnosis: Dysphagia, pharyngeal phase (R13.13) Past Medical History: Past Medical History: Diagnosis Date  Aortic stenosis, mild   Arthritis   Asthmatic bronchitis   with colds per patient  Carotid artery disease (HCC)   40-59% bilateral ICA stenosis  Cataract   Cutaneous abscess of right foot   Glaucoma   Heart murmur   Dr Felipe Horton is her  cardiologist.   Hyperlipemia   Hypertension   Dr. Felipe Horton ~ 2 years ago  Neck fracture [facility])   july 2013  Osteoporosis   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  20mm S3UR via TF approach with Dr. Lorie Rook and Dr. Honey Lusty  Syncope   Type 1 diabetes mellitus [facility])  Past Surgical History: Past Surgical History: Procedure Laterality Date  ANKLE FRACTURE SURGERY Left 2001  steel plate and 3 screws   ANTERIOR CERVICAL CORPECTOMY N/A 07/31/2014  Procedure: Cervical Four to Cervical Six Corpectomy;  Surgeon: Adelbert Adler, MD;  Location: [facility]UR[facility] OR[facility];  Service: Neurosurgery;  Laterality: N/A;  C4 to C6 Corpectomy  BREAST SURGERY  1988  CATARACT EXTRACTION W/ INTRAOCULAR LENS  IMPLANT, BILATERAL Bilateral 2011  right and then left  EYE SURGERY    FRACTURE SURGERY    HAMMER TOE SURGERY  1998  I & D EXTREMITY Right 09/27/2018  Procedure: IRRIGATION AND DEBRIDEMENT RIGHT FOOT;  Surgeon: Timothy Ford, MD;  Location: [facility] OR;  Service: Orthopedics;  Laterality: Right;  INTRAOPERATIVE TRANSTHORACIC ECHOCARDIOGRAM N/A 02/06/2023  Procedure: INTRAOPERATIVE TRANSTHORACIC ECHOCARDIOGRAM;  Surgeon: Kyra Phy, MD;  Location: [facility];  Service: Open Heart  Surgery;  Laterality: N/A;  JOINT REPLACEMENT    PACEMAKER IMPLANT N/A 02/10/2019  Procedure: PACEMAKER IMPLANT;  Surgeon: Tammie Fall, MD;  Location: [facility] INVASIVE CV LAB;  Service: Cardiovascular;  Laterality: N/A;  RIGHT/LEFT HEART CATH AND CORONARY ANGIOGRAPHY N/A 01/11/2023  Procedure: RIGHT/LEFT HEART CATH AND CORONARY ANGIOGRAPHY;  Surgeon: Kyra Phy, MD;  Location: [facility] INVAS[facility] CV LAB;  Service: Cardiovascular;  Laterality: N/A;  TONSILLECTOMY AND ADENOIDECTOMY  1948  TOTAL SHOULDER REPLACEMENT  2010  right shoulder   TRANSCATHETER AORTIC VALVE REPLACEMENT, TRANSFEMORAL N/A 02/06/2023  Procedure: Transcatheter Aortic Valve Replacement, Transfemoral;  Surgeon: Thukkani, Arun K, MD;  Location: [facility] OR;  Service: Open Heart Surgery;  Laterality: N/A;  TUBAL LIGATION  1979  TYMPANOSTOMY TUBE PLACEMENT Bilateral  Amanda L. Couture, MA CCC/SLP Clinical  Specialist - Acute Care SLP Acute Rehabilitation Services Office number [phone] Myna Asal Laurice 04/16/2024, 4:26 PM  MR BRAIN WO CONTRAST Result Date: 04/16/2024 CLINICAL DATA:  Seizure, new onset, history of trauma EXAM: MRI HEAD WITHOUT CONTRAST TECHNIQUE: Multiplanar, multiecho pulse sequences of the brain and surrounding structures were obtained without intravenous contrast. COMPARISON:  Head CTA from yesterday FINDINGS: Brain: Dense encephalomalacia in the anterior left frontal lobe with adjacent gliosis and volume loss, site of ICH in 2020. Symmetric anterior temporal T2 hyperintensity with volume loss, likely prior contusion. Mild T2 hyperintensity in the cerebral white matter and pons usually from chronic small vessel ischemia. Brain atrophy. No acute infarct, acute hemorrhage, hydrocephalus, or collection. Vascular: Major flow voids are preserved Skull and upper cervical spine: Extensive cervical spine fusion. Sinuses/Orbits: Chronic sinusitis with complete left maxillary and right sphenoid sinus opacification. Associated sclerotic wall  thickening by CT which is even more extensive. No acute finding in the orbits IMPRESSION: No acute finding when compared to prior. Left frontal and bilateral anterior temporal encephalomalacia including cortex. Generalized atrophy. Chronic sinusitis. Electronically Signed   By: Ronnette Coke M.D.   On: 04/16/2024 10:34   US  THYROID  Result Date: 04/16/2024 CLINICAL DATA:  Palpable abnormality. EXAM: THYROID  ULTRASOUND TECHNIQUE: Ultrasound examination of the thyroid  gland and adjacent soft tissues was performed. COMPARISON:  None Available. FINDINGS: Parenchymal Echotexture: Markedly heterogenous Isthmus: 0.3 cm Right lobe: 3.9 x 2.1 x 2.0 cm Left lobe: 3.0 x 1.9 x 1.7 cm _________________________________________________________ Estimated total number of nodules >/= 1 cm: 3 Number of spongiform nodules >/=  2 cm not described below (TR1): 0 Number of mixed cystic and solid nodules >/= 1.5 cm not described below (TR2): 0 _________________________________________________________ Markedly heterogeneous thyroid  parenchyma. The thyroid  gland is lobular with numerous areas of pseudo nodularity. Vascularity is normal on color Doppler imaging. Nodule # 1: Isoechoic solid nodule in the right mid gland measures 1.3 x 1.1 x 0.8 cm. TI-RADS category 3. Given size (<1.4 cm) and appearance, this nodule does NOT meet TI-RADS criteria for biopsy or dedicated follow-up. Nodule # 2: Isoechoic solid nodule in the right mid gland measures 1.5 x 1.3 x 1.5 cm. Margins are ill-defined. TI-RADS category 3. *Given size (>/= 1.5 - 2.4 cm) and appearance, a follow-up ultrasound in 1 year should be considered based on TI-RADS criteria. Nodule # 3: Isoechoic solid nodule in the left mid gland measures 1.2 x 0.9 x 0.6 cm. TI-RADS category 3. Given size (<1.4 cm) and appearance, this nodule does NOT meet TI-RADS criteria for biopsy or dedicated follow-up. IMPRESSION: 1. Heterogeneous and lobular thyroid  gland without significant enlargement  or hypervascularity. 2. Small 1.5 cm TI-RADS category 3 nodule in the right gland meets criteria for imaging surveillance. Recommend follow-up ultrasound in 1 year. The above is in keeping with the ACR TI-RADS recommendations - J Am Coll Radiol 2017;14:587-595. Electronically Signed   By: Fernando Hoyer M.D.   On: 04/16/2024 05:23   CT Angio Chest PE W and/or Wo Contrast Result Date: 04/15/2024 CLINICAL DATA:  Chest pain and shortness of breath EXAM: CT ANGIOGRAPHY CHEST WITH CONTRAST TECHNIQUE: Multidetector CT imaging of the chest was performed using the standard protocol during bolus administration of intravenous contrast. Multiplanar CT image reconstructions and MIPs were obtained to evaluate the vascular anatomy. RADIATION DOSE REDUCTION: This exam was performed according to the departmental dose-optimization program which includes automated exposure control, adjustment of the mA and/or kV according to patient size and/or use of iterative reconstruction technique.  CONTRAST:  OMNIPAQUE  IOHEXOL  350 MG/ML SOLN COMPARISON:  Same day chest radiograph, CTA chest dated 01/18/2023 FINDINGS: Cardiovascular: Left anterior chest wall pacemaker leads terminate in the right atrium and right ventricle. Status post aortic valve replacement. Extensive mitral annular calcifications. The study is high quality for the evaluation of pulmonary embolism. There are no filling defects in the central, lobar, segmental or subsegmental pulmonary artery branches to suggest acute pulmonary embolism. Great vessels are normal in course and caliber. Normal heart size. No significant pericardial fluid/thickening. Coronary artery calcifications and aortic atherosclerosis. Mediastinum/Nodes: Heterogeneous thyroid  gland. Mildly patulous esophagus. No pathologically enlarged axillary, supraclavicular, mediastinal, or hilar lymph nodes. Lungs/Pleura: The central airways are patent. Mild diffuse bronchial wall thickening. Bilateral lower  lobe tree-in-bud nodules, right-greater-than-left. Subpleural right lower lobe consolidation. No pneumothorax. No pleural effusion. Upper abdomen: Normal. Musculoskeletal: No acute or abnormal lytic or blastic osseous lesions. Multilevel degenerative changes of the thoracic spine. Partially imaged right shoulder arthroplasty. 1.7 x 0.9 cm ovoid density within the right breast (20:97). Partially imaged cervical spinal fixation hardware appears intact. Review of the MIP images confirms the above findings. IMPRESSION: 1. No evidence of pulmonary embolism. 2. Bilateral lower lobe tree-in-bud nodules, right-greater-than-left, and subpleural right lower lobe consolidation, likely aspiration with developing right lower lobe pneumonia. 3. A 1.7 x 0.9 cm ovoid density within the right breast. Recommend correlation with dedicated breast imaging, as clinically indicated. Aortic Atherosclerosis (ICD10-I70.0). Coronary artery calcifications. Assessment for potential risk factor modification, dietary therapy or pharmacologic therapy may be warranted, if clinically indicated. Electronically Signed   By: Limin  Xu M.D.   On: 04/15/2024 19:22   CT ANGIO HEAD NECK W WO CM (CODE STROKE) Result Date: 04/15/2024 CLINICAL DATA:  Provided history: Neuro deficit, acute, stroke suspected. EXAM: CT ANGIOGRAPHY HEAD AND NECK WITH AND WITHOUT CONTRAST TECHNIQUE: Multidetector CT imaging of the head and neck was performed using the standard protocol during bolus administration of intravenous contrast. Multiplanar CT image reconstructions and MIPs were obtained to evaluate the vascular anatomy. Carotid stenosis measurements (when applicable) are obtained utilizing NASCET criteria, using the distal internal carotid diameter as the denominator. RADIATION DOSE REDUCTION: This exam was performed according to the departmental dose-optimization program which includes automated exposure control, adjustment of the mA and/or kV according to patient  size and/or use of iterative reconstruction technique. CONTRAST:  OMNIPAQUE  IOHEXOL  350 MG/ML SOLN COMPARISON:  Non-contrast head CT and CT angiogram head/neck 02/10/2024. FINDINGS: CT HEAD FINDINGS Brain: Generalized cerebral atrophy. 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 There is no acute intracranial hemorrhage. No acute demarcated cortical infarct. No extra-axial fluid collection. No evidence of an intracranial mass. No midline shift. Vascular: No hyperdense vessel.  Atherosclerotic calcifications. Skull: No calvarial fracture or aggressive osseous lesion. Sinuses/Orbits: Severe right frontal sinusitis (with complete sinus opacification). Mild bilateral ethmoid sinusitis. Severe left maxillary sinusitis (with associated chronic reactive osteitis). No acute intracranial finding. These results were communicated to Dr. Cleone Dad At 6:01 pmon 5/20/2025by text page via the [facility] messaging system. Review of the MIP images confirms the above findings CTA NECK FINDINGS Aortic arch: Standard aortic branching (correlating with the prior CTA of 02/10/2024). The innominate artery origin is excluded from the field of view on the current study. The visualized thoracic aorta is normal in caliber. Atherosclerotic plaque within the visualized aortic arch and proximal major branch vessels of the neck. Streak/beam hardening artifact arising from a dense contrast bolus partially  obscures the right subclavian artery. Within described limitations, no hemodynamically significant innominate or proximal subclavian artery stenosis is identified. Right carotid system: CCA and ICA patent within the neck without measurable stenosis. Atherosclerotic plaque about the carotid bifurcation, within the proximal ICA and within the proximal ECA. Left carotid system: CCA and ICA patent within the neck without measurable stenosis.  Atherosclerotic plaque about the carotid bifurcation, within the proximal ICA and within the proximal ECA. Vertebral arteries: Streak/beam hardening artifact arising from a right shoulder prosthesis partially obscures the left vertebral artery proximal V1 segment. Within this limitation, the vertebral arteries are codominant and patent within the neck. Atherosclerotic plaque within the right vertebral artery at the V3/V4 junction with no more than mild stenosis. Nonstenotic atherosclerotic plaque within the left vertebral artery at the V3/V4 junction. Skeleton: Prior ACDF at the C3-C7 levels (with intervening corpectomy cage). Chronic mildly displaced type 2 odontoid fracture, unchanged. As before, the dens fracture fragment is fused to the C1 anterior arch. Other neck: Heterogeneous and mildly enlarged thyroid  gland. Upper chest: No consolidation within the imaged lung apices. Review of the MIP images confirms the above findings CTA HEAD FINDINGS Anterior circulation: The intracranial internal carotid arteries are patent. Atherosclerotic plaque within both vessels with no more than mild stenosis. The M1 middle cerebral arteries are patent. No M2 proximal branch occlusion or high-grade proximal stenosis. The anterior cerebral arteries are patent. No intracranial aneurysm is identified. Posterior circulation: The intracranial vertebral arteries are patent. Atherosclerotic plaque within the right vertebral artery at the V3/V4 junction with no more than mild stenosis. Nonstenotic atherosclerotic plaque within the left vertebral artery at the V3/V4 junction. The basilar artery is patent. The posterior cerebral arteries are patent. Posterior communicating arteries are present bilaterally. Venous sinuses: Within the limitations of contrast timing, no convincing thrombus. Anatomic variants: As described. Review of the MIP images confirms the above findings No emergent large vessel occlusion identified. These results were  communicated to Dr. Cleone Dad At 6:25 pmon 5/20/2025by text page via the [facility] messaging system. IMPRESSION: Non-contrast head CT: 1. No acute intracranial finding. 2. 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 3. Generalized cerebral atrophy. 4. Paranasal sinus disease as described (most notably, severe right frontal and left maxillary sinusitis). CTA neck: 1. The common carotid and internal carotid arteries are patent within the neck without stenosis. Atherosclerotic plaque bilaterally, as described. 2. Streak/beam hardening artifact partially obscures the left vertebral artery proximal V1 segment. Within this limitation, the vertebral arteries are patent within the neck. A sclerotic plaque within the bilateral vertebral arteries at the V3/V4 junction (no more than mild stenosis on the right, non-stenotic on the left). 3. Aortic Atherosclerosis (ICD10-I70.0). 4. Heterogeneous and mildly enlarged thyroid  gland. Given the patient's age, a non-emergent thyroid  ultrasound may be obtained for further evaluation as clinically appropriate. Reference: J Am Coll Radiol. 2015 Feb;12(2): 143-50. CTA head: Intracranial atherosclerotic disease as described. No proximal intracranial large vessel occlusion or high-grade proximal arterial stenosis identified. Electronically Signed   By: Bascom Lily D.O.   On: 04/15/2024 18:26   DG Chest Portable 1 View Result Date: 04/15/2024 CLINICAL DATA:  Chest pain EXAM: PORTABLE CHEST 1 VIEW COMPARISON:  04/01/2024 FINDINGS: Partially visualized right shoulder replacement. Left-sided pacing device and valve prosthesis as before. No acute airspace disease or pleural effusion. Dense mitral calcification. Normal cardiomediastinal silhouette with aortic atherosclerosis. No pneumothorax. Hardware in the cervical spine. Multiple right-sided rib fractures as  noted previously. IMPRESSION: No  active disease. Electronically Signed   By: Esmeralda Hedge M.D.   On: 04/15/2024 16:54   DG ESOPHAGUS W SINGLE CM (SOL OR THIN BA) Result Date: 04/02/2024 CLINICAL DATA:  83 year old Danielle who presented to the ED on 04/01/2024 after being found unresponsive due to choking on one of her pills. Patient with a history of chronic difficulty swallowing and increasing number of episodes of choking over the past several weeks. Request for esophagram. EXAM: ESOPHAGUS/BARIUM SWALLOW/TABLET STUDY TECHNIQUE: Single contrast examination was performed using thin liquid barium. This exam was performed by Estella Helling, PA-C, and was supervised and interpreted by Dr. Wilnette Haste. FLUOROSCOPY: Radiation Exposure Index (as provided by the fluoroscopic device): 5.2 mGy Kerma COMPARISON:  DG SWALLOW FUNC MEDICARE - 04/07/24. FINDINGS: Swallowing: Appears normal. No vestibular penetration or aspiration seen. Pharynx: Unremarkable. Esophagus: No visible mucosal abnormalities. Narrowing of the upper esophagus roughly at the level of C7-T1, although difficult to fully view due to hardware. Possible esophageal diverticulum in the area of the gastroesophageal junction. Esophageal motility: Mild dysmotility with intermittent tertiary contractions. Hiatal Hernia: None. Gastroesophageal reflux: None visualized. Ingested 13mm barium tablet: Patient refused to swallow tablet whole. Other: None. IMPRESSION: Mild esophageal dysmotility Small epiphrenic diverticulum. Electronically Signed   By: Myrlene Asper D.O.   On: 04/02/2024 15:59   DG Thoracic Spine 2 View Result Date: 04/01/2024 CLINICAL DATA:  Back pain EXAM: THORACIC SPINE 2 VIEWS; LUMBAR SPINE - 2-3 VIEW COMPARISON:  CT chest abdomen pelvis 01/18/2023 FINDINGS: Right convex thoracic and left convex lumbar curve. Demineralization and overlapping soft tissue limits assessment for subtle fractures. Compression fracture of T12 with 50% vertebral body height loss is new since 01/18/2023.  Advanced lumbar spondylosis and facet arthropathy. IMPRESSION: Compression fracture of T12 is age indeterminate but new since 01/18/2023. Electronically Signed   By: Rozell Cornet M.D.   On: 04/01/2024 20:29   DG Lumbar Spine 2-3 Views Result Date: 04/01/2024 CLINICAL DATA:  Back pain EXAM: THORACIC SPINE 2 VIEWS; LUMBAR SPINE - 2-3 VIEW COMPARISON:  CT chest abdomen pelvis 01/18/2023 FINDINGS: Right convex thoracic and left convex lumbar curve. Demineralization and overlapping soft tissue limits assessment for subtle fractures. Compression fracture of T12 with 50% vertebral body height loss is new since 01/18/2023. Advanced lumbar spondylosis and facet arthropathy. IMPRESSION: Compression fracture of T12 is age indeterminate but new since 01/18/2023. Electronically Signed   By: Rozell Cornet M.D.   On: 04/01/2024 20:29   DG Chest Portable 1 View Result Date: 04/01/2024 CLINICAL DATA:  hypoxia to 60's, possible aspiration, AMS. EXAM: PORTABLE CHEST 1 VIEW COMPARISON:  12/04/2023. FINDINGS: There are nonspecific opacities in the right para cardiac region, which may represent pneumonitis versus atelectasis and can be due to suspected aspiration. Bilateral lung fields are otherwise clear. Bilateral costophrenic angles are clear. Stable cardio-mediastinal silhouette. Mitral annulus calcifications noted. 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 No acute osseous abnormalities. Subacute/healing right posterior rib fractures noted. Right shoulder arthroplasty noted. There is cervicothoracic spinal fixation hardware. The soft tissues are within normal limits. IMPRESSION: *Nonspecific opacities in the right para cardiac region, which may represent pneumonitis versus atelectasis and can be due to suspected aspiration. Electronically Signed   By: Beula Brunswick M.D.   On: 04/01/2024 16:23   CT HEAD WO CONTRAST ( ) Result Date: 04/01/2024 CLINICAL DATA:  Altered mental status. EXAM: CT  HEAD WITHOUT CONTRAST TECHNIQUE: Contiguous axial images were obtained from the base of the skull  through the vertex without intravenous contrast. RADIATION DOSE REDUCTION: This exam was performed according to the departmental dose-optimization program which includes automated exposure control, adjustment of the mA and/or kV according to patient size and/or use of iterative reconstruction technique. COMPARISON:  February 10, 2024 FINDINGS: Brain: There is generalized cerebral atrophy with widening of the extra-axial spaces and ventricular dilatation. There are areas of decreased attenuation within the white matter tracts of the supratentorial brain, consistent with microvascular disease changes. Stable areas of bilateral anterior temporal lobe and left frontal lobe encephalomalacia are noted. Vascular: Marked severity bilateral cavernous carotid artery calcification is seen. Skull: Normal. Negative for fracture or focal lesion. Sinuses/Orbits: There is marked severity left maxillary sinus, left-sided sphenoid sinus and right-sided frontal sinus mucosal thickening. Mild bilateral maxillary sinus mucosal thickening is also noted. Other: None. IMPRESSION: 1. Generalized cerebral atrophy with widening of the extra-axial spaces and ventricular dilatation. 2. Stable areas of bilateral anterior temporal lobe and left frontal lobe encephalomalacia. 3. Marked severity left maxillary sinus, left-sided sphenoid sinus and right-sided frontal sinus mucosal thickening. Electronically Signed   By: Virgle Grime M.D.   On: 04/01/2024 16:04    Microbiology: Results for orders placed or performed during the hospital encounter of 04/15/24  Resp panel by RT-PCR (RSV, Flu A&B, Covid) Anterior Nasal Swab     Status: None   Collection Time: 04/15/24  3:57 PM   Specimen: Anterior Nasal Swab  Result Value Ref Range Status   SARS Coronavirus 2 by RT PCR NEGATIVE NEGATIVE Final   Influenza A by PCR NEGATIVE NEGATIVE Final    Influenza B by PCR NEGATIVE NEGATIVE Final    Comment: (NOTE) The Xpert Xpress SARS-CoV-2/FLU/RSV plus assay is intended as an aid in the diagnosis of influenza from Nasopharyngeal swab specimens and should not be used as a sole basis for treatment. Nasal washings and aspirates are unacceptable for Xpert Xpress SARS-CoV-2/FLU/RSV testing.  Fact Sheet for Patients: BloggerCourse.com  Fact Sheet for Healthcare Providers: SeriousBroker.it  This test is not yet approved or cleared by the [place]  FDA and has been authorized for detection and/or diagnosis of SARS-CoV-2 by FDA under an Emergency Use Authorization (EUA). This EUA will remain in effect (meaning this test can be used) for the duration of the COVID-19 declaration under Section 564(b)(1) of the Act, 21 U.S.C. section 360bbb-3(b)(1), unless the authorization is terminated or revoked.     Resp Syncytial Virus by PCR NEGATIVE NEGATIVE Final    Comment: (NOTE) Fact Sheet for Patients: BloggerCourse.com  Fact Sheet for Healthcare Providers: SeriousBroker.it  This test is not yet approved or cleared by the [place]  FDA and has been authorized for detection and/or diagnosis of SARS-CoV-2 by FDA under an Emergency Use Authorization (EUA). This EUA will remain in effect (meaning this test can be used) for the duration of the COVID-19 declaration under Section 564(b)(1) of the Act, 21 U.S.C. section 360bbb-3(b)(1), unless the authorization is terminated or revoked.  Performed at [facility], [address], [place], [place] [address]     Labs: CBC: No results for input(s): "WBC", "NEUTROABS", "HGB", "HCT", "MCV", "PLT" in the last 168 hours. Basic Metabolic Panel: Recent Labs  Lab 04/22/24 0431  CREATININE 0.76   Liver Function Tests: No results for input(s): "AST", "ALT", "ALKPHOS", "BILITOT", "PROT",  "ALBUMIN" in the last 168 hours. CBG: Recent Labs  Lab 04/24/24 1209 04/24/24 1639 04/24/24 2034 04/25/24 0502 04/25/24 0842  GLUCAP 149* 244* 287* 148* 162*    Discharge  time spent: 42 minutes.   Signed: Feliciana Horn, MD [facility] 04/25/2024

## 2024-04-25 NOTE — TOC Transition Note (Signed)
 Transition of Care [facility]) - Discharge Note   Patient Details  Name: Danielle Clarke MRN: [id number] Date of Birth: [birth year]  Transition of Care [facility] CM/SW Contact:  Dane Dung, RN Phone Number: 04/25/2024, 10:22 AM   Clinical Narrative:    CM called and spoke with the patient's daughter [name] this morning and [facility] delivered hospital bed, hoyer lift, bedside table, and 3:1 to the home last night.  I alerted Melissa, CM with [facility] and asked that WC be ordered and delivered in the next 1-2 days but will not impede discharge to home today.  Attending MD was notified and requested discharge orders for home today when able.  PTAR will be arranged for home in the next 1-2 hours.  Bedside nursing to call report to the patient's daughter by phone.   Final next level of care: Home w Home Health Services     Patient Goals and CMS Choice            Discharge Placement                       Discharge Plan and Services Additional resources added to the After Visit Summary for                                       Social Drivers of Health (SDOH) Interventions SDOH Screenings   Food Insecurity: No Food Insecurity (04/16/2024)  Housing: Low Risk  (04/16/2024)  Transportation Needs: No Transportation Needs (04/16/2024)  Utilities: Not At Risk (04/16/2024)  Depression (PHQ2-9): Low Risk  (05/28/2019)  Social Connections: Unknown (04/16/2024)  Tobacco Use: Low Risk  (04/16/2024)     Readmission Risk Interventions    04/23/2024   11:16 AM 04/02/2024   11:26 AM 02/07/2023   12:07 PM  Readmission Risk Prevention Plan  Post Dischage Appt   Complete  Medication Screening   Complete  Transportation Screening Complete Complete Complete  PCP or Specialist Appt within 5-7 Days Complete Complete   Home Care Screening Complete Complete   Medication Review (RN CM) Complete Referral to Pharmacy

## 2024-04-25 NOTE — Progress Notes (Signed)
 Patient picked up by transport, Spoke with [name] about patient being transported.

## 2024-04-25 NOTE — Plan of Care (Signed)

## 2024-04-25 NOTE — Progress Notes (Signed)
 Reviewed discharge with Daughter [name]  questions answered.  AVS in packet for transport.

## 2024-05-23 ENCOUNTER — Ambulatory Visit (INDEPENDENT_AMBULATORY_CARE_PROVIDER_SITE_OTHER): Payer: Medicare HMO

## 2024-05-23 DIAGNOSIS — I495 Sick sinus syndrome: Secondary | ICD-10-CM | POA: Diagnosis not present

## 2024-05-29 LAB — CUP PACEART REMOTE DEVICE CHECK
Date Time Interrogation Session: 20250627091937
Implantable Lead Connection Status: 753985
Implantable Lead Connection Status: 753985
Implantable Lead Implant Date: 20200316
Implantable Lead Implant Date: 20200316
Implantable Lead Location: 753859
Implantable Lead Location: 753860
Implantable Lead Model: 377
Implantable Lead Model: 377
Implantable Lead Serial Number: 81099636
Implantable Lead Serial Number: 81165051
Implantable Pulse Generator Implant Date: 20200316
Pulse Gen Model: 407145
Pulse Gen Serial Number: 69557134

## 2024-06-05 ENCOUNTER — Ambulatory Visit: Payer: Self-pay | Admitting: Internal Medicine

## 2024-08-04 NOTE — Addendum Note (Signed)
 Addended by: VICCI SELLER A on: 08/04/2024 10:03 AM   Modules accepted: Orders

## 2024-08-04 NOTE — Progress Notes (Signed)
 Remote pacemaker transmission.

## 2024-08-22 ENCOUNTER — Ambulatory Visit: Payer: Medicare HMO

## 2024-08-22 DIAGNOSIS — I495 Sick sinus syndrome: Secondary | ICD-10-CM

## 2024-08-23 LAB — CUP PACEART REMOTE DEVICE CHECK
Date Time Interrogation Session: 20250926102843
Implantable Lead Connection Status: 753985
Implantable Lead Connection Status: 753985
Implantable Lead Implant Date: 20200316
Implantable Lead Implant Date: 20200316
Implantable Lead Location: 753859
Implantable Lead Location: 753860
Implantable Lead Model: 377
Implantable Lead Model: 377
Implantable Lead Serial Number: 81099636
Implantable Lead Serial Number: 81165051
Implantable Pulse Generator Implant Date: 20200316
Pulse Gen Model: 407145
Pulse Gen Serial Number: 69557134

## 2024-08-24 ENCOUNTER — Ambulatory Visit: Payer: Self-pay | Admitting: Internal Medicine

## 2024-08-27 NOTE — Progress Notes (Signed)
 Remote PPM Transmission

## 2024-11-23 ENCOUNTER — Inpatient Hospital Stay (HOSPITAL_COMMUNITY)
Admission: EM | Admit: 2024-11-23 | Discharge: 2024-11-28 | DRG: 521 | Disposition: A | Discharge status: Skilled Nursing Facility | Attending: Internal Medicine | Admitting: Internal Medicine

## 2024-11-23 ENCOUNTER — Encounter (HOSPITAL_COMMUNITY): Payer: Self-pay

## 2024-11-23 ENCOUNTER — Emergency Department (HOSPITAL_COMMUNITY)

## 2024-11-23 DIAGNOSIS — Z950 Presence of cardiac pacemaker: Secondary | ICD-10-CM

## 2024-11-23 DIAGNOSIS — Z7982 Long term (current) use of aspirin: Secondary | ICD-10-CM

## 2024-11-23 DIAGNOSIS — Z961 Presence of intraocular lens: Secondary | ICD-10-CM | POA: Diagnosis present

## 2024-11-23 DIAGNOSIS — G40909 Epilepsy, unspecified, not intractable, without status epilepticus: Secondary | ICD-10-CM | POA: Diagnosis present

## 2024-11-23 DIAGNOSIS — N182 Chronic kidney disease, stage 2 (mild): Secondary | ICD-10-CM | POA: Diagnosis present

## 2024-11-23 DIAGNOSIS — W010XXA Fall on same level from slipping, tripping and stumbling without subsequent striking against object, initial encounter: Secondary | ICD-10-CM | POA: Diagnosis present

## 2024-11-23 DIAGNOSIS — E10649 Type 1 diabetes mellitus with hypoglycemia without coma: Secondary | ICD-10-CM | POA: Diagnosis not present

## 2024-11-23 DIAGNOSIS — S22080A Wedge compression fracture of T11-T12 vertebra, initial encounter for closed fracture: Secondary | ICD-10-CM | POA: Diagnosis present

## 2024-11-23 DIAGNOSIS — Z66 Do not resuscitate: Secondary | ICD-10-CM | POA: Diagnosis present

## 2024-11-23 DIAGNOSIS — E109 Type 1 diabetes mellitus without complications: Secondary | ICD-10-CM | POA: Diagnosis present

## 2024-11-23 DIAGNOSIS — D72829 Elevated white blood cell count, unspecified: Secondary | ICD-10-CM | POA: Diagnosis not present

## 2024-11-23 DIAGNOSIS — S51011A Laceration without foreign body of right elbow, initial encounter: Secondary | ICD-10-CM | POA: Diagnosis present

## 2024-11-23 DIAGNOSIS — Z515 Encounter for palliative care: Secondary | ICD-10-CM

## 2024-11-23 DIAGNOSIS — Z8673 Personal history of transient ischemic attack (TIA), and cerebral infarction without residual deficits: Secondary | ICD-10-CM

## 2024-11-23 DIAGNOSIS — I10 Essential (primary) hypertension: Secondary | ICD-10-CM | POA: Diagnosis present

## 2024-11-23 DIAGNOSIS — Z9841 Cataract extraction status, right eye: Secondary | ICD-10-CM

## 2024-11-23 DIAGNOSIS — D638 Anemia in other chronic diseases classified elsewhere: Secondary | ICD-10-CM | POA: Diagnosis present

## 2024-11-23 DIAGNOSIS — R54 Age-related physical debility: Secondary | ICD-10-CM | POA: Diagnosis present

## 2024-11-23 DIAGNOSIS — R269 Unspecified abnormalities of gait and mobility: Secondary | ICD-10-CM | POA: Diagnosis present

## 2024-11-23 DIAGNOSIS — Z9842 Cataract extraction status, left eye: Secondary | ICD-10-CM

## 2024-11-23 DIAGNOSIS — I779 Disorder of arteries and arterioles, unspecified: Secondary | ICD-10-CM | POA: Diagnosis present

## 2024-11-23 DIAGNOSIS — Z881 Allergy status to other antibiotic agents status: Secondary | ICD-10-CM

## 2024-11-23 DIAGNOSIS — I442 Atrioventricular block, complete: Secondary | ICD-10-CM | POA: Diagnosis present

## 2024-11-23 DIAGNOSIS — R569 Unspecified convulsions: Secondary | ICD-10-CM

## 2024-11-23 DIAGNOSIS — N183 Chronic kidney disease, stage 3 unspecified: Secondary | ICD-10-CM | POA: Diagnosis present

## 2024-11-23 DIAGNOSIS — R296 Repeated falls: Secondary | ICD-10-CM | POA: Diagnosis present

## 2024-11-23 DIAGNOSIS — I350 Nonrheumatic aortic (valve) stenosis: Secondary | ICD-10-CM

## 2024-11-23 DIAGNOSIS — I129 Hypertensive chronic kidney disease with stage 1 through stage 4 chronic kidney disease, or unspecified chronic kidney disease: Secondary | ICD-10-CM | POA: Diagnosis present

## 2024-11-23 DIAGNOSIS — S72001A Fracture of unspecified part of neck of right femur, initial encounter for closed fracture: Principal | ICD-10-CM | POA: Diagnosis present

## 2024-11-23 DIAGNOSIS — E785 Hyperlipidemia, unspecified: Secondary | ICD-10-CM | POA: Diagnosis present

## 2024-11-23 DIAGNOSIS — I619 Nontraumatic intracerebral hemorrhage, unspecified: Secondary | ICD-10-CM | POA: Diagnosis present

## 2024-11-23 DIAGNOSIS — M4854XA Collapsed vertebra, not elsewhere classified, thoracic region, initial encounter for fracture: Secondary | ICD-10-CM | POA: Diagnosis present

## 2024-11-23 DIAGNOSIS — Z6820 Body mass index (BMI) 20.0-20.9, adult: Secondary | ICD-10-CM

## 2024-11-23 DIAGNOSIS — E1065 Type 1 diabetes mellitus with hyperglycemia: Secondary | ICD-10-CM | POA: Diagnosis present

## 2024-11-23 DIAGNOSIS — Z79899 Other long term (current) drug therapy: Secondary | ICD-10-CM

## 2024-11-23 DIAGNOSIS — Z794 Long term (current) use of insulin: Secondary | ICD-10-CM

## 2024-11-23 DIAGNOSIS — Y92009 Unspecified place in unspecified non-institutional (private) residence as the place of occurrence of the external cause: Secondary | ICD-10-CM

## 2024-11-23 DIAGNOSIS — R64 Cachexia: Secondary | ICD-10-CM | POA: Diagnosis present

## 2024-11-23 DIAGNOSIS — E43 Unspecified severe protein-calorie malnutrition: Secondary | ICD-10-CM | POA: Diagnosis present

## 2024-11-23 DIAGNOSIS — Z952 Presence of prosthetic heart valve: Secondary | ICD-10-CM

## 2024-11-23 DIAGNOSIS — D62 Acute posthemorrhagic anemia: Secondary | ICD-10-CM | POA: Diagnosis not present

## 2024-11-23 DIAGNOSIS — E1022 Type 1 diabetes mellitus with diabetic chronic kidney disease: Secondary | ICD-10-CM | POA: Diagnosis present

## 2024-11-23 LAB — BASIC METABOLIC PANEL WITH GFR
Anion gap: 11 (ref 5–15)
BUN: 17 mg/dL (ref 8–23)
CO2: 27 mmol/L (ref 22–32)
Calcium: 9 mg/dL (ref 8.9–10.3)
Chloride: 97 mmol/L — ABNORMAL LOW (ref 98–111)
Creatinine, Ser: 0.71 mg/dL (ref 0.44–1.00)
GFR, Estimated: >60 mL/min
Glucose, Bld: 203 mg/dL — ABNORMAL HIGH (ref 70–99)
Potassium: 4.3 mmol/L (ref 3.5–5.1)
Sodium: 135 mmol/L (ref 135–145)

## 2024-11-23 LAB — CBC WITH DIFFERENTIAL/PLATELET
Abs Immature Granulocytes: 0.08 K/uL — ABNORMAL HIGH (ref 0.00–0.07)
Basophils Absolute: 0.1 K/uL (ref 0.0–0.1)
Basophils Relative: 1 %
Eosinophils Absolute: 0.1 K/uL (ref 0.0–0.5)
Eosinophils Relative: 1 %
HCT: 40.4 % (ref 36.0–46.0)
Hemoglobin: 13.2 g/dL (ref 12.0–15.0)
Immature Granulocytes: 1 %
Lymphocytes Relative: 8 %
Lymphs Abs: 1.1 K/uL (ref 0.7–4.0)
MCH: 27.8 pg (ref 26.0–34.0)
MCHC: 32.7 g/dL (ref 30.0–36.0)
MCV: 85.2 fL (ref 80.0–100.0)
Monocytes Absolute: 0.7 K/uL (ref 0.1–1.0)
Monocytes Relative: 5 %
Neutro Abs: 11.9 K/uL — ABNORMAL HIGH (ref 1.7–7.7)
Neutrophils Relative %: 84 %
Platelets: 212 K/uL (ref 150–400)
RBC: 4.74 MIL/uL (ref 3.87–5.11)
RDW: 14.9 % (ref 11.5–15.5)
WBC: 13.9 K/uL — ABNORMAL HIGH (ref 4.0–10.5)
nRBC: 0 % (ref 0.0–0.2)

## 2024-11-23 LAB — GLUCOSE, CAPILLARY: Glucose-Capillary: 251 mg/dL — ABNORMAL HIGH (ref 70–99)

## 2024-11-23 LAB — TYPE AND SCREEN
ABO/RH(D): A POS
Antibody Screen: NEGATIVE

## 2024-11-23 LAB — PHOSPHORUS: Phosphorus: 3.1 mg/dL (ref 2.5–4.6)

## 2024-11-23 LAB — PROTIME-INR
INR: 0.9 (ref 0.8–1.2)
Prothrombin Time: 13.1 s (ref 11.4–15.2)

## 2024-11-23 LAB — HEMOGLOBIN A1C
Hgb A1c MFr Bld: 9.9 % — ABNORMAL HIGH (ref 4.8–5.6)
Mean Plasma Glucose: 237.43 mg/dL

## 2024-11-23 LAB — CBG MONITORING, ED: Glucose-Capillary: 239 mg/dL — ABNORMAL HIGH (ref 70–99)

## 2024-11-23 LAB — MAGNESIUM: Magnesium: 2 mg/dL (ref 1.7–2.4)

## 2024-11-23 MED ORDER — TRAZODONE HCL 50 MG PO TABS
25.0000 mg | ORAL_TABLET | Freq: Every evening | ORAL | Status: DC | PRN
  Administered 2024-11-25: 25 mg via ORAL

## 2024-11-23 MED ORDER — MEGESTROL ACETATE 20 MG PO TABS: 20.0000 mg | ORAL_TABLET | Freq: Two times a day (BID) | ORAL | Status: DC

## 2024-11-23 MED ORDER — HEPARIN SODIUM (PORCINE) 5000 UNIT/ML IJ SOLN: 5000.0000 [IU] | Freq: Three times a day (TID) | INTRAMUSCULAR | Status: DC

## 2024-11-23 MED ORDER — FLEET ENEMA RE ENEM: 1.0000 | ENEMA | Freq: Once | RECTAL | Status: DC | PRN

## 2024-11-23 MED ORDER — ASPIRIN 81 MG PO TBEC: 81.0000 mg | DELAYED_RELEASE_TABLET | Freq: Every day | ORAL | Status: DC

## 2024-11-23 MED ORDER — HYDROMORPHONE HCL 1 MG/ML IJ SOLN
0.5000 mg | INTRAMUSCULAR | Status: DC | PRN
  Administered 2024-11-23: 1 mg via INTRAVENOUS
  Administered 2024-11-24: 0.5 mg via INTRAVENOUS
  Filled 2024-11-23 (×2): qty 1

## 2024-11-23 MED ORDER — METOPROLOL TARTRATE 25 MG PO TABS
25.0000 mg | ORAL_TABLET | Freq: Two times a day (BID) | ORAL | Status: DC
  Administered 2024-11-24 – 2024-11-28 (×8): 25 mg via ORAL
  Filled 2024-11-23: qty 1

## 2024-11-23 MED ORDER — SIMVASTATIN 20 MG PO TABS
40.0000 mg | ORAL_TABLET | Freq: Every day | ORAL | Status: DC
  Administered 2024-11-24 – 2024-11-27 (×4): 40 mg via ORAL
  Filled 2024-11-23: qty 2

## 2024-11-23 MED ORDER — MORPHINE SULFATE (PF) 4 MG/ML IV SOLN
4.0000 mg | INTRAVENOUS | Status: AC | PRN
  Administered 2024-11-23 (×2): 4 mg via INTRAVENOUS
  Filled 2024-11-23 (×2): qty 1

## 2024-11-23 MED ORDER — IPRATROPIUM BROMIDE 0.02 % IN SOLN: 0.5000 mg | Freq: Four times a day (QID) | RESPIRATORY_TRACT | Status: DC | PRN

## 2024-11-23 MED ORDER — HYDRALAZINE HCL 20 MG/ML IJ SOLN: 10.0000 mg | INTRAMUSCULAR | Status: DC | PRN

## 2024-11-23 MED ORDER — ACETAMINOPHEN 650 MG RE SUPP: 650.0000 mg | Freq: Four times a day (QID) | RECTAL | Status: DC | PRN

## 2024-11-23 MED ORDER — SENNOSIDES-DOCUSATE SODIUM 8.6-50 MG PO TABS: 1.0000 | ORAL_TABLET | Freq: Every evening | ORAL | Status: DC | PRN

## 2024-11-23 MED ORDER — ONDANSETRON HCL 4 MG/2ML IJ SOLN
4.0000 mg | Freq: Four times a day (QID) | INTRAMUSCULAR | Status: DC | PRN
  Administered 2024-11-24 (×2): 4 mg via INTRAVENOUS
  Filled 2024-11-23: qty 2

## 2024-11-23 MED ORDER — METHOCARBAMOL 500 MG PO TABS: 500.0000 mg | ORAL_TABLET | Freq: Three times a day (TID) | ORAL | Status: DC

## 2024-11-23 MED ORDER — SODIUM CHLORIDE 0.9% FLUSH: 3.0000 mL | Freq: Two times a day (BID) | INTRAVENOUS | Status: DC

## 2024-11-23 MED ORDER — LEVETIRACETAM 250 MG PO TABS
250.0000 mg | ORAL_TABLET | Freq: Two times a day (BID) | ORAL | Status: DC
  Administered 2024-11-23 – 2024-11-28 (×11): 250 mg via ORAL
  Filled 2024-11-23 (×4): qty 1

## 2024-11-23 MED ORDER — INSULIN ASPART 100 UNIT/ML IJ SOLN
0.0000 [IU] | Freq: Three times a day (TID) | INTRAMUSCULAR | Status: DC
  Administered 2024-11-23: 5 [IU] via SUBCUTANEOUS
  Administered 2024-11-23: 3 [IU] via SUBCUTANEOUS
  Administered 2024-11-24: 7 [IU] via SUBCUTANEOUS
  Filled 2024-11-23: qty 3
  Filled 2024-11-23: qty 5
  Filled 2024-11-23: qty 7

## 2024-11-23 MED ORDER — INSULIN GLARGINE-YFGN 100 UNIT/ML ~~LOC~~ SOLN: 10.0000 [IU] | Freq: Every day | SUBCUTANEOUS | Status: DC

## 2024-11-23 MED ORDER — SODIUM CHLORIDE 0.9% FLUSH
3.0000 mL | Freq: Two times a day (BID) | INTRAVENOUS | Status: DC
  Administered 2024-11-23 – 2024-11-27 (×9): 3 mL via INTRAVENOUS

## 2024-11-23 MED ORDER — INSULIN GLARGINE 100 UNIT/ML ~~LOC~~ SOLN
10.0000 [IU] | Freq: Every day | SUBCUTANEOUS | Status: DC
  Filled 2024-11-23: qty 0.1

## 2024-11-23 MED ORDER — SENNOSIDES-DOCUSATE SODIUM 8.6-50 MG PO TABS
1.0000 | ORAL_TABLET | Freq: Every day | ORAL | Status: DC
  Administered 2024-11-23 – 2024-11-27 (×5): 1 via ORAL
  Filled 2024-11-23 (×2): qty 1

## 2024-11-23 MED ORDER — ACETAMINOPHEN 325 MG PO TABS
650.0000 mg | ORAL_TABLET | Freq: Four times a day (QID) | ORAL | Status: DC | PRN
  Administered 2024-11-27: 650 mg via ORAL

## 2024-11-23 MED ORDER — OXYCODONE HCL 5 MG PO TABS: 5.0000 mg | ORAL_TABLET | ORAL | Status: DC | PRN

## 2024-11-23 MED ORDER — BISACODYL 5 MG PO TBEC: 5.0000 mg | DELAYED_RELEASE_TABLET | Freq: Every day | ORAL | Status: DC | PRN

## 2024-11-23 MED ORDER — SODIUM CHLORIDE 0.9 % IV SOLN: INTRAVENOUS | Status: AC

## 2024-11-23 NOTE — H&P (Addendum)
 " History and Physical   Patient: Danielle Clarke                            PCP: [name], MD                    DOB: [birth year]            DOA: 11/23/2024 FMW:[id number]             DOS: 11/23/2024, 11:08 AM  [name], Velna SAUNDERS, MD  Patient coming from:   HOME  I have personally reviewed patient's medical records, in electronic medical records, including:  [facility], and care everywhere.    Chief Complaint:   Chief Complaint  Patient presents with   Fall    History of present illness:    Danielle Clarke is a 83 pleasant female with extensive history DM1, HTN, HLD, aortic stenosis, s/p TAVR, seizures,  of Recurrent falls, multiple bone fractures in the past, hemorrhagic stroke in 2020, progressive weakness, generalized cachexia, under hospice care at home. Presented to the ED, post mechanical, accidental fall last night, as she was trying to put on her pajamas.  Accompanied by daughter.  Reporting no signs of seizures, did not hit her head, did not lose consciousness.  No complaint of any upper respiratory infection, denies any fever, chills, nausea vomiting, no complaint of dysuria.  ED Evaluation: Blood pressure (!) 145/49, pulse 76, temperature 98.1 F (36.7 C), temperature source Oral, resp. rate 12, SpO2 92%.  LABs: CBC BMP reviewed chloride 97, glucose 203, WBC 13.9, CT head/cervical spine, chronic odontoid fracture, hardware in place no acute fractures identified X-ray right hip: Acute, mildly displaced right femoral neck fracture.     Patient Denies having: Fever, Chills, Cough, SOB, Chest Pain, Abd pain, N/V/D, headache, dizziness, lightheadedness,  Dysuria, Joint pain, rash, open wounds  ED Course:   Blood pressure (!) 145/49, pulse 76, temperature 98.1 F (36.7 C), temperature source Oral, resp. rate 12, SpO2 92%. Abnormal labs;   Review of Systems: As per HPI, otherwise 10 point review of systems were negative.    ----------------------------------------------------------------------------------------------------------------------  Allergies[1]  Home MEDs:  Prior to Admission medications  Medication Sig Start Date End Date Taking? Authorizing Provider  Amino Acids -Protein Hydrolys (PRO-STAT AWC) LIQD Take 30 mLs by mouth daily.    [provider]  aspirin  EC 81 MG tablet Take 1 tablet (81 mg total) by mouth daily. Swallow whole. 12/14/22   Thukkani, Arun K, MD  dextrose  (GLUTOSE) 40 % GEL Take 15 g by mouth See admin instructions. Give 15g by mouth every hour as needed for hypoglycemia - recheck blood sugar in 30 minutes.    [provider]  diclofenac  Sodium (VOLTAREN ) 1 % GEL Apply 4 g topically 4 (four) times daily. 04/25/24   Akula, Vijaya, MD  insulin  glargine-yfgn (SEMGLEE ) 100 UNIT/ML injection Inject 0.06 mLs (6 Units total) into the skin 2 (two) times daily. 04/25/24   Akula, Vijaya, MD  insulin  lispro (HUMALOG) 100 UNIT/ML injection Inject 0-12 Units into the skin See admin instructions. Inject 0-12 units subcutaneously before meals and at bedtime per sliding scale: < 70 : contact provider 70-200 : 0 units 201-250 : 2 units 251-300 : 4 units 301-350 : 6 units 351-400 : 8 units 401-450 : 10 units 451-600 : 12 units - recheck blood sugar in 2 hours, if BS > 350 or < 100, notify  provider.    [provider]  levETIRAcetam  (KEPPRA ) 250 MG tablet Take 1 tablet (250 mg total) by mouth 2 (two) times daily. 04/25/24   Akula, Vijaya, MD  lidocaine  (LIDODERM ) 5 % Place 1 patch onto the skin daily as needed (lower back pain).    [provider]  megestrol  (MEGACE ) 40 MG tablet Take 20 mg by mouth 2 (two) times daily.    [provider]  methocarbamol  (ROBAXIN ) 750 MG tablet Take 750 mg by mouth 4 (four) times daily.    [provider]  metoprolol  tartrate (LOPRESSOR ) 25 MG tablet Take 1 tablet (25 mg total) by mouth as directed for 1 dose. 02/16/24    Sebastian Lamarr SAUNDERS, PA-C  polyethylene glycol (MIRALAX  / GLYCOLAX ) 17 g packet Take 17 g by mouth daily as needed for mild constipation. 04/25/24   Akula, Vijaya, MD  simvastatin  (ZOCOR ) 40 MG tablet Take 40 mg by mouth at bedtime. 03/20/20   [provider]  traMADol  (ULTRAM ) 50 MG tablet Take 1 tablet (50 mg total) by mouth every 6 (six) hours as needed. 04/04/24   Trixie Nilda HERO, MD    PRN MEDs: acetaminophen  **OR** acetaminophen , bisacodyl , hydrALAZINE , HYDROmorphone  (DILAUDID ) injection, ipratropium, oxyCODONE , sodium phosphate , traZODone   Past Medical History:  Diagnosis Date   Aortic stenosis, mild    Arthritis    Asthmatic bronchitis    with colds per patient   Carotid artery disease    40-59% bilateral ICA stenosis   Cataract    Cutaneous abscess of right foot    Glaucoma    Heart murmur    Dr Claudene is her  cardiologist.    Hyperlipemia    Hypertension    Dr. 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 Wendel and Dr. Maryjane   Syncope    Type 1 diabetes mellitus [facility])     Past Surgical History:  Procedure Laterality Date   ANKLE FRACTURE SURGERY Left 2001   steel plate and 3 screws    ANTERIOR CERVICAL CORPECTOMY N/A 07/31/2014   Procedure: Cervical Four to Cervical Six Corpectomy;  Surgeon: Catalina HERO Stains, MD;  Location: [facility] NEURO ORS;  Service: Neurosurgery;  Laterality: N/A;  C4 to C6 Corpectomy   BREAST SURGERY  1988   CATARACT EXTRACTION W/ INTRAOCULAR LENS  IMPLANT, BILATERAL Bilateral 2011   right and then left   EYE SURGERY     FRACTURE SURGERY     HAMMER TOE SURGERY  1998   I & D EXTREMITY Right 09/27/2018   Procedure: IRRIGATION AND DEBRIDEMENT RIGHT FOOT;  Surgeon: Harden Jerona GAILS, MD;  Location: [facility] OR;  Service: Orthopedics;  Laterality: Right;   INTRAOPERATIVE TRANSTHORACIC ECHOCARDIOGRAM N/A 02/06/2023   Procedure:  INTRAOPERATIVE TRANSTHORACIC ECHOCARDIOGRAM;  Surgeon: Wendel Lurena POUR, MD;  Location[facility];  Service: Open Heart Surgery;  Laterality: N/A;   JOINT REPLACEMENT     PACEMAKER IMPLANT N/A 02/10/2019   Procedure: PACEMAKER IMPLANT;  Surgeon: Waddell Danelle ORN, MD;  Location: [facility] INVASIVE CV LAB;  Service: Cardiovascular;  Laterality: N/A;   RIGHT/LEFT HEART CATH AND CORONARY ANGIOGRAPHY N/A 01/11/2023   Procedure: RIGHT/LEFT HEART CATH AND CORONARY ANGIOGRAPHY;  Surgeon: Wendel Lurena POUR, MD;  Location: [facility] INVASIVE CV LAB;  Service: Cardiovascular;  Laterality: N/A;   TONSILLECTOMY AND ADENOIDECTOMY  1948   TOTAL  SHOULDER REPLACEMENT  2010   right shoulder    TRANSCATHETER AORTIC VALVE REPLACEMENT, TRANSFEMORAL N/A 02/06/2023   Procedure: Transcatheter Aortic Valve Replacement, Transfemoral;  Surgeon: Thukkani, Arun K, MD;  Location: [facility] OR;  Service: Open Heart Surgery;  Laterality: N/A;   TUBAL LIGATION  1979   TYMPANOSTOMY TUBE PLACEMENT Bilateral      reports that she has never smoked. She has never used smokeless tobacco. She reports current alcohol use of about 2.0 standard drinks of alcohol per week. She reports that she does not use drugs.   Family History  Problem Relation Age of Onset   Cancer Mother    Heart Problems Father     Physical Exam:   Vitals:   11/23/24 0820 11/23/24 0945  BP: (!) 168/68 (!) 145/49  Pulse: 82 76  Resp: 18 12  Temp: 98.1 F (36.7 C)   TempSrc: Oral   SpO2: 96% 92%   Constitutional: NAD, calm, comfortable, chronically ill looking female, cachectic Eyes: PERRL, lids and conjunctivae normal ENMT: Mucous membranes are moist. Posterior pharynx clear of any exudate or lesions.Normal dentition.  Neck: normal, supple, no masses, no thyromegaly Respiratory: clear to auscultation bilaterally, no wheezing, no crackles. Normal respiratory effort. No accessory muscle use.  Cardiovascular: Regular rate and rhythm, no murmurs / rubs / gallops. No extremity  edema. 2+ pedal pulses. No carotid bruits.  Abdomen: no tenderness, no masses palpated. No hepatosplenomegaly. Bowel sounds positive.  Musculoskeletal: Generalized muscle wasting, cachexia noted Limited range of motion of the right hip, point tenderness, pain with any movement Able to move all other extremities in bed  no clubbing / cyanosis.  Intact upper and lower extremities. no contractures. Normal muscle tone.  Neurologic: CN II-XII grossly intact. Sensation intact, DTR normal. Strength 5/5 in all 4.  Psychiatric: Normal judgment and insight. Alert and oriented x 3. Normal mood.  Skin: no rashes, lesions, ulcers. No induration       Labs on admission:    I have personally reviewed following labs and imaging studies  CBC: Recent Labs  Lab 11/23/24 0822  WBC 13.9*  NEUTROABS 11.9*  HGB 13.2  HCT 40.4  MCV 85.2  PLT 212   Basic Metabolic Panel: Recent Labs  Lab 11/23/24 0822  NA 135  K 4.3  CL 97*  CO2 27  GLUCOSE 203*  BUN 17  CREATININE 0.71  CALCIUM 9.0   GFR: CrCl cannot be calculated (Unknown ideal weight.). Liver Function Tests: No results for input(s): AST, ALT, ALKPHOS, BILITOT, PROT, ALBUMIN in the last 168 hours. No results for input(s): LIPASE, AMYLASE in the last 168 hours. No results for input(s): AMMONIA in the last 168 hours. Coagulation Profile: Recent Labs  Lab 11/23/24 0822  INR 0.9    Urine analysis:    Component Value Date/Time   COLORURINE YELLOW 02/02/2023 0950   APPEARANCEUR CLEAR 02/02/2023 0950   LABSPEC 1.021 02/02/2023 0950   PHURINE 5.0 02/02/2023 0950   GLUCOSEU >=500 (A) 02/02/2023 0950   HGBUR NEGATIVE 02/02/2023 0950   BILIRUBINUR NEGATIVE 02/02/2023 0950   KETONESUR 5 (A) 02/02/2023 0950   PROTEINUR NEGATIVE 02/02/2023 0950   UROBILINOGEN 0.2 10/07/2014 1808   NITRITE NEGATIVE 02/02/2023 0950   LEUKOCYTESUR NEGATIVE 02/02/2023 0950    Last A1C:  Lab Results  Component Value Date   HGBA1C  9.7 (H) 04/01/2024     Radiologic Exams on Admission:   CT Head Wo Contrast Result Date: 11/23/2024 EXAM: CT HEAD WITHOUT CONTRAST 11/23/2024 09:30:15 AM  TECHNIQUE: CT of the head was performed without the administration of intravenous contrast. Automated exposure control, iterative reconstruction, and/or weight based adjustment of the mA/kV was utilized to reduce the radiation dose to as low as reasonably achievable. COMPARISON: CT Head 04/15/2024, Brain MRI 04/16/2024, CT cervical spine reported separately today. CLINICAL HISTORY: 83 year old female with minor head trauma after a fall at 8:15 PM. FINDINGS: BRAIN AND VENTRICLES: No acute hemorrhage. No evidence of acute infarct. No hydrocephalus. No extra-axial collection. No mass effect or midline shift. Pronounced chronic anterior left frontal lobe and anterior bitemporal encephalomalacia is stable. Stable brain volume. Stable ex vacuo ventricular enlargement. Stable gray white differentiation. Chronic dural calcification incidentally noted. No suspicious intracranial vascular hyperdensity. Advanced calcified atherosclerosis at the skull base. ORBITS: No acute abnormality. SINUSES: Chronic paranasal sinusitis with areas of advanced mucoperiosteal thickening is stable. SOFT TISSUES AND SKULL: No acute soft tissue abnormality. No skull fracture. Tympanic cavities and mastoids remain well aerated. IMPRESSION: 1. No acute traumatic injury identified. 2. Stable chronic left frontal and bitemporal encephalomalacia. 3. Stable chronic paranasal sinus disease. Electronically signed by: Helayne Hurst MD 11/23/2024 10:00 AM EST RP Workstation: [id number]   CT Cervical Spine Wo Contrast Result Date: 11/23/2024 EXAM: CT CERVICAL SPINE WITHOUT CONTRAST 11/23/2024 09:30:15 AM TECHNIQUE: CT of the cervical spine was performed without the administration of intravenous contrast. Multiplanar reformatted images are provided for review. Automated exposure control, iterative  reconstruction, and/or weight-based adjustment of the mA/kV was utilized to reduce the radiation dose to as low as reasonably achievable. COMPARISON: CT head reported separately today, previous cervical spine CT on 02/06/2019, and CTA head and neck on 04/15/2024. CLINICAL HISTORY: 83 year old female, fell at 8:15 PM. 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 No significant change in mild distraction and displacement since 2020, and the chronic odontoid fragment does appear solidly fused with the anterior C1 ring. 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 However, chronic anterior cervical fusion hardware spanning from C3 to C7 now demonstrates severe loosening and bone resorption at the C7 vertebral body level (series 6, image 44) and C6-C7 pseudarthrosis. Developing degenerative bilateral C6-C7 facet ankylosis, which is new from 2020. Partially visible upper thoracic spinal hyperostosis and interbody ankylosis also. C3 cortical screw loosening is less pronounced but also substantially progressed since 2020 (series 2, image 40). No superimposed acute osseous abnormality identified in the cervical spine. DEGENERATIVE CHANGES: No strong CT evidence of cervical spinal stenosis. SOFT TISSUES: Prevertebral soft tissue thickening at the cervicothoracic junction appears chronic but has progressed (series 8, image 67), but no C7 or T1 endplate erosion is associated. Stable lung apex ventilation. Partially visible left chest cardiac pacemaker device. Calcified cervical carotid artery atherosclerosis. IMPRESSION: 1. Since May 2025 substantial hardware loosening and bone resorption at C7-T1 , also C3. Associated nonspecific prevertebral soft tissue swelling there. But  but no strong CT evidence of discitis osteomyelitis. Associated C6-C7 pseudarthrosis, superimposed on otherwise solid C2 through C6 spinal fusion. 2. No superimposed acute traumatic injury identified in the cervical spine. 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 Electronically signed by: Helayne Hurst MD 11/23/2024 09:58 AM EST RP Workstation: [id number]   DG Hip Unilat With Pelvis 2-3 Views Right Result Date: 11/23/2024 CLINICAL DATA:  Fall.  Right hip pain. EXAM: DG HIP (WITH OR WITHOUT PELVIS)  2-3V RIGHT COMPARISON:  None Available. FINDINGS: Acute, mildly displaced right femoral neck fracture is seen. No evidence of hip dislocation. No pelvic fracture identified. Mild pubic symphysis degenerative changes are seen. Peripheral vascular calcification also noted. IMPRESSION: Acute, mildly displaced right femoral neck fracture. Electronically Signed   By: [name] DELENA Kil M.D.   On: 11/23/2024 09:43    EKG:   Independently reviewed.  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   - N.p.o., gentle IV fluid hydration -EDP consulted orthopedic team, pending further evaluation recommendation  CKD (chronic kidney disease), stage III (HCC) BUN/creatinine at baseline -Monitor closely, avoid nephrotoxins, hypotension  Essential hypertension, benign Mild tach blood pressure, currently stable continue metoprolol   Diabetes mellitus type 1 (HCC) History of diabetes mellitus type 1, patient is on insulin  at home not on insulin  pump -Last A1c 9.7-7 months ago -Checking A1c -While n.p.o. will check her CBG every 4 hours, SSI coverage -Once tolerating p.o. resuming home regimen  Seizures (HCC) - Per patient and daughter questionable absence seizure episodes -Patient has been compliant with her Keppra  -Will continue Keppra  250 mg p.o. twice daily -No seizure like activity described with this event of fall  Protein-calorie malnutrition, severe Height 5 2, weight 39.5 kg Severe protein malnutrition, continue dietary supplements once patient can tolerate p.o.   Compression fracture of T12 vertebra (HCC) Chronic-noted With some gait abnormality, per daughter still ambulates with assistance Pain is controlled  S/P TAVR (transcatheter aortic valve replacement) Aortic stenosis, status post TAVR, 02/06/23.  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 Noted-patient remained stable  - Denies any chest pain, or palpitation -Continuing aspirin , Toprol   Anemia of chronic disease History of anemia of chronic disease, currently H&H is stable -Will monitor closely  Dyslipidemia Continue statins  Pacemaker In place-noted  Bilateral carotid artery  disease Continue statins, aspirin  once a day  Intracerebral hemorrhage (HCC) In 2020, no further complication, or debility, stable ambulates with cane CT head today revealed no acute intracranial abnormalities, chronic changes       Ethics: Discussed with patient and daughter at bedside-CODE STATUS -goals of care Patient remains DNR/DNI Due to multiple comorbidities, remain under hospice care at home       Consults called: Orthopedic team -------------------------------------------------------------------------------------------------------------------------------------------- DVT prophylaxis:  heparin  injection 5,000 Units Start: 11/24/24 0600 Place and maintain sequential compression  device Start: 11/23/24 1028 SCDs Start: 11/23/24 1026   Code Status:   Code Status: Full Code   Admission status: Patient will be admitted as Observation, with a greater than 2 midnight length of stay. Level of care: Telemetry   Family Communication:  none at bedside  (The above findings and plan of care has been discussed with patient in detail, the patient expressed understanding and agreement of above plan)  --------------------------------------------------------------------------------------------------------------------------------------------------  Disposition Plan: >3 days Status is: Observation The patient remains OBS appropriate and will d/c before 2 midnights.     ----------------------------------------------------------------------------------------------------------------------------------------------------  Time spent:  16  Min.  Was spent seeing and evaluating the patient, reviewing all medical records, drawn plan of care.  SIGNED: Adriana DELENA Grams, MD, FHM. FAAFP. [facility], Pager  (Please use [email or web address] to page/ or secure chat through epic) If 7PM-7AM, please contact night-coverage [email or web address],  11/23/2024, 11:08 AM     [1]   Allergies Allergen Reactions   Augmentin [Amoxicillin-Pot Clavulanate] Anaphylaxis   Ceclor [Cefaclor] Anaphylaxis    Tolerated cephalexin in 2018   Firvanq  [Vancomycin ] Anaphylaxis   Sumycin [Tetracycline] Anaphylaxis   Accupril  [Quinapril  Hcl] Other (See Comments)    Hyperkalemia   Bactrim  [Sulfamethoxazole -Trimethoprim ] Other (See Comments)    CKD - decreased kidney function   Fosamax [Alendronate Sodium] Other (See Comments)    Unknown reaction   Nsaids Other (See Comments)    CKD   Deltasone [Prednisone] Other (See Comments)    Unknown reaction   "

## 2024-11-23 NOTE — Assessment & Plan Note (Signed)
 History of diabetes mellitus type 1, patient is on insulin  at home not on insulin  pump -Last A1c 9.7-7 months ago -Checking A1c -While n.p.o. will check her CBG every 4 hours, SSI coverage -Once tolerating p.o. resuming home regimen

## 2024-11-23 NOTE — Assessment & Plan Note (Signed)
 Continue statins, aspirin  once a day

## 2024-11-23 NOTE — Progress Notes (Signed)
 PT Cancellation Note  Patient Details Name: NALINI ALCARAZ MRN: [id number] DOB: [birth year]   Cancelled Treatment:    Reason Eval/Treat Not Completed: Other (comment) (Pt with fractured hip and awaiting ortho consult.)   Stephane JULIANNA Bevel 11/23/2024, 10:31 AM Betheny Suchecki M,PT Acute Rehab Services [phone]

## 2024-11-23 NOTE — Assessment & Plan Note (Signed)
 History of anemia of chronic disease, currently H&H is stable -Will monitor closely

## 2024-11-23 NOTE — Assessment & Plan Note (Signed)
 Noted-patient remained stable  - Denies any chest pain, or palpitation -Continuing aspirin , Toprol 

## 2024-11-23 NOTE — ED Triage Notes (Signed)
 Pt BIB [facility] from home. Pt had a fall around 20:15 last night.  Pt stated she was changing into her pajamas then lost balance. Pt hit her head when she fell. Not on blood thinner. Hospice assisted the pt up from the floor to the bathroom. Pt is on hospice care. R leg is longer than L which is normal. Abrasion on R arm.   148/76 HR 78 92% RA Cbg 194

## 2024-11-23 NOTE — Assessment & Plan Note (Signed)
 Height 5 2, weight 39.5 kg Severe protein malnutrition, continue dietary supplements once patient can tolerate p.o.

## 2024-11-23 NOTE — Hospital Course (Addendum)
 Danielle Clarke is a 71 yof w/ extensive history DM1, HTN, HLD, aortic stenosis, s/p TAVR, seizures, recurrent falls, multiple bone fractures in the past, hemorrhagic stroke in 2020, progressive weakness, generalized cachexia, under hospice care at home who presented to the ED, post mechanical, accidental fall as she was trying to put on her pajamas. In ED: Blood pressure (!) 145/49, pulse 76, temperature 98.1 F (36.7 C), temperature source Oral, resp. rate 12, SpO2 92%. LABs: CBC BMP reviewed chloride 97, glucose 203, WBC 13.9, CT head/cervical spine, chronic odontoid fracture, hardware in place no acute fractures identified X-ray right hip: Acute, mildly displaced right femoral neck fracture.  Patient was admitted for further management S/p right hemiarthroplasty by Dr. [name] 12/29 and now WBAT on the right leg and PT recommending skilled nursing facility  Subjective: Seen and examined  Daughter at the bedside Patient waking up but alert awake Remains afebrile vital stable labs shows leukocytosis resolved 16>>10, hb 11>>9.7  Assessment and plan:  Closed right hip fracture 2/2 mechanical fall: CT head/cervical spine on admit no acute fracture. X-ray right hip w/ acute, mildly displaced right femoral neck fracture.  S/p right hemiarthroplasty by Dr. [name] 12/29 Cont WBAT on RLE, cont pain management,Bowel regimen as per ortho. On asa 81 mg bid for DVT prophylaxis Cont PTOT, waiting for SNF at this time  CKD stage II: Creatinine stable.  Monitor.   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  Continuing aspirin , Toprol     T1 DM since age of 39, with uncontrolled hyperglycemia on long term insulin , with hypoglycemia 12/31 am: Last A1c 9.7-7 months ago.  On Lantus  6 u bid-continue home regimen blood sugar at times dropped to 52 this morning -change to 4u in bid and cahnge to 0-9 u ssi  achs from q4h Recent Labs  Lab 11/23/24 0822 11/23/24 1244 11/25/24 2018 11/26/24 0040 11/26/24 0426 11/26/24 0552 11/26/24 0838  GLUCAP  --    < > 91 136* 52* 135* 192*  HGBA1C 9.9*  --   --   --   --   --   --    < > = values in this interval not displayed.     Seizures disorder: Per patient and daughter questionable absence seizure episodes. Cont home Keppra    Protein-calorie malnutrition, severe Augment diet as able.     Compression fracture of T12 vertebra Chronic. With some gait abnormality, per daughter still ambulates with assistance. Ptot   Anemia of chronic disease ABLA due to fracture/surgery: Hemoglobin dropping but remains stable monitor and transfuse if less than 7 g Recent Labs  Lab 11/23/24 0822 11/24/24 0409 11/25/24 0505 11/26/24 0438  HGB 13.2 11.1* 9.4* 9.7*  HCT 40.4 34.5* 28.8* 30.2*    Dyslipidemia Continue statins    Bilateral carotid artery disease Continue statins, aspirin    Intracerebral hemorrhage: In 2020, no further complication, or debility, stable ambulates with cane CT head IN ED no acute intracranial abnormalities, chronic changes   Mobility: PT Orders: Active PT Follow up Rec: Skilled Nursing-Short Term Rehab (<3 Hours/Day)11/25/2024 0840   DVT prophylaxis: SCDs Start: 11/24/24 1848 Place TED hose Start: 11/24/24 1848 Place and maintain sequential compression device Start: 11/23/24 1028 SCDs Start: 11/23/24 1026 Code Status:   Code Status: Full Code Family Communication: plan of care discussed with patient and daughter  Patient status is: Remains hospitalized because of severity of illness Level of care: Telemetry  Dispo: The patient is from: Home with hospice            Anticipated disposition: Anticipating SNF-waiting to discuss TOC planning to revoke hospice and SNF if they decide that  Objective: Vitals last 24 hrs: Vitals:   11/25/24 1956 11/26/24 0400 11/26/24 0500 11/26/24 0838  BP: (!) 140/56 (!) 152/52  (!)  139/58  Pulse:    92  Resp: 18   16  Temp: 98.4 F (36.9 C) 97.9 F (36.6 C)  98.3 F (36.8 C)  TempSrc: Oral Oral  Oral  SpO2: 92%  93% 94%   Physical Examination: General exam: AAO, thin frail HEENT:Oral mucosa moist, Ear/Nose WNL grossly Respiratory system: Bilaterally clear BS,no use of accessory muscle Cardiovascular system: S1 & S2 +, No JVD. Gastrointestinal system: Abdomen soft,NT,ND, BS+ Nervous System: Alert, awake, moving all extremities w/ limited mobility in the right hip due to pain Extremities: extremities warm, leg edema neg, RT HIP aquacel dressing + c/d/i Skin: Warm, no rashes MSK: Normal muscle bulk,tone, power   Medications reviewed:  Scheduled Meds:  aspirin  EC  81 mg Oral BID   docusate sodium   100 mg Oral BID   insulin  aspart  0-6 Units Subcutaneous TID WC   insulin  glargine  4 Units Subcutaneous BID   levETIRAcetam   250 mg Oral BID   metoprolol  tartrate  25 mg Oral BID   montelukast   10 mg Oral QHS   mupirocin  ointment  1 Application Nasal BID   senna-docusate  1 tablet Oral QHS   simvastatin   40 mg Oral QHS   sodium chloride  flush  3 mL Intravenous Q12H  Continuous Infusions: Diet: Diet Order             Diet Carb Modified Room service appropriate? Yes  Diet effective now

## 2024-11-23 NOTE — Assessment & Plan Note (Signed)
 Continue statins

## 2024-11-23 NOTE — Assessment & Plan Note (Signed)
 In place-noted

## 2024-11-23 NOTE — Assessment & Plan Note (Signed)
 In 2020, no further complication, or debility, stable ambulates with cane CT head today revealed no acute intracranial abnormalities, chronic changes

## 2024-11-23 NOTE — Assessment & Plan Note (Signed)
 BUN/creatinine at baseline -Monitor closely, avoid nephrotoxins, hypotension

## 2024-11-23 NOTE — Assessment & Plan Note (Signed)
 Mild tach blood pressure, currently stable continue metoprolol 

## 2024-11-23 NOTE — Assessment & Plan Note (Signed)
 Chronic-noted With some gait abnormality, per daughter still ambulates with assistance Pain is controlled

## 2024-11-23 NOTE — ED Provider Notes (Signed)
 " [place] EMERGENCY DEPARTMENT AT [facility] Provider Note   CSN: [id number] Arrival date & time: 11/23/24  9242     Patient presents with: Danielle Clarke   DAMAYA CHANNING is a 83 y.o. female.    Fall     Patient has history of diabetes acute renal failure hypertension hyperlipidemia aortic stenosis intracerebral hemorrhage, dyslipidemia hypertension.  Patient states she had a fall last night when she was changing into her pajamas.  She lost her balance and fell.  Patient states she hit her head she think she may have lost consciousness.  Hospice initially assisted her up but patient is having severe pain in her right hip.  Prior to Admission medications  Medication Sig Start Date End Date Taking? Authorizing Provider  Amino Acids -Protein Hydrolys (PRO-STAT AWC) LIQD Take 30 mLs by mouth daily.    [provider]  aspirin  EC 81 MG tablet Take 1 tablet (81 mg total) by mouth daily. Swallow whole. 12/14/22   Thukkani, Arun K, MD  dextrose  (GLUTOSE) 40 % GEL Take 15 g by mouth See admin instructions. Give 15g by mouth every hour as needed for hypoglycemia - recheck blood sugar in 30 minutes.    [provider]  diclofenac  Sodium (VOLTAREN ) 1 % GEL Apply 4 g topically 4 (four) times daily. 04/25/24   Akula, Vijaya, MD  insulin  glargine-yfgn (SEMGLEE ) 100 UNIT/ML injection Inject 0.06 mLs (6 Units total) into the skin 2 (two) times daily. 04/25/24   Akula, Vijaya, MD  insulin  lispro (HUMALOG) 100 UNIT/ML injection Inject 0-12 Units into the skin See admin instructions. Inject 0-12 units subcutaneously before meals and at bedtime per sliding scale: < 70 : contact provider 70-200 : 0 units 201-250 : 2 units 251-300 : 4 units 301-350 : 6 units 351-400 : 8 units 401-450 : 10 units 451-600 : 12 units - recheck blood sugar in 2 hours, if BS > 350 or < 100, notify provider.    [provider]  levETIRAcetam  (KEPPRA ) 250 MG tablet Take 1 tablet (250 mg total) by  mouth 2 (two) times daily. 04/25/24   Cherlyn Labella, MD  lidocaine  (LIDODERM ) 5 % Place 1 patch onto the skin daily as needed (lower back pain).    [provider]  megestrol  (MEGACE ) 40 MG tablet Take 20 mg by mouth 2 (two) times daily.    [provider]  methocarbamol  (ROBAXIN ) 750 MG tablet Take 750 mg by mouth 4 (four) times daily.    [provider]  metoprolol  tartrate (LOPRESSOR ) 25 MG tablet Take 1 tablet (25 mg total) by mouth as directed for 1 dose. 02/16/24   Sebastian Lamarr SAUNDERS, PA-C  polyethylene glycol (MIRALAX  / GLYCOLAX ) 17 g packet Take 17 g by mouth daily as needed for mild constipation. 04/25/24   Akula, Vijaya, MD  simvastatin  (ZOCOR ) 40 MG tablet Take 40 mg by mouth at bedtime. 03/20/20   [provider]  traMADol  (ULTRAM ) 50 MG tablet Take 1 tablet (50 mg total) by mouth every 6 (six) hours as needed. 04/04/24   Gherghe, Costin M, MD    Allergies: Augmentin [amoxicillin-pot clavulanate], Ceclor [cefaclor], Firvanq  [vancomycin ], Sumycin [tetracycline], Accupril  [quinapril  hcl], Bactrim  [sulfamethoxazole -trimethoprim ], Fosamax [alendronate sodium], Nsaids, and Deltasone [prednisone]    Review of Systems  Updated Vital Signs BP (!) 145/49   Pulse 76   Temp 98.1 F (36.7 C) (Oral)   Resp 12   SpO2 92%   Physical Exam Vitals and nursing note reviewed.  Constitutional:  Appearance: She is ill-appearing.     Comments: Frail, underweight  HENT:     Head: Normocephalic and atraumatic.     Right Ear: External ear normal.     Left Ear: External ear normal.  Eyes:     General: No scleral icterus.       Right eye: No discharge.        Left eye: No discharge.     Conjunctiva/sclera: Conjunctivae normal.  Neck:     Trachea: No tracheal deviation.  Cardiovascular:     Rate and Rhythm: Normal rate and regular rhythm.  Pulmonary:     Effort: Pulmonary effort is normal. No respiratory distress.     Breath sounds: Normal breath sounds. No  stridor. No wheezing or rales.  Abdominal:     General: Bowel sounds are normal. There is no distension.     Palpations: Abdomen is soft.     Tenderness: There is no abdominal tenderness. There is no guarding or rebound.  Musculoskeletal:        General: Tenderness present. No deformity.     Cervical back: Neck supple.     Right hip: Tenderness and bony tenderness present. Decreased range of motion.  Skin:    General: Skin is warm and dry.     Findings: No rash.  Neurological:     General: No focal deficit present.     Mental Status: She is alert.     Cranial Nerves: No cranial nerve deficit, dysarthria or facial asymmetry.     Sensory: No sensory deficit.     Motor: No abnormal muscle tone or seizure activity.     Coordination: Coordination normal.  Psychiatric:        Mood and Affect: Mood normal.     (all labs ordered are listed, but only abnormal results are displayed) Labs Reviewed  BASIC METABOLIC PANEL WITH GFR - Abnormal; Notable for the following components:      Result Value   Chloride 97 (*)    Glucose, Bld 203 (*)    All other components within normal limits  CBC WITH DIFFERENTIAL/PLATELET - Abnormal; Notable for the following components:   WBC 13.9 (*)    Neutro Abs 11.9 (*)    Abs Immature Granulocytes 0.08 (*)    All other components within normal limits  EXPECTORATED SPUTUM ASSESSMENT W GRAM STAIN, RFLX TO RESP C  PROTIME-INR  MAGNESIUM   PHOSPHORUS  TYPE AND SCREEN    EKG: EKG Interpretation Date/Time:  Sunday November 23 2024 08:21:29 EST Ventricular Rate:  75 PR Interval:  140 QRS Duration:  106 QT Interval:  463 QTC Calculation: 518 R Axis:   -18  Text Interpretation: Sinus rhythm LVH with secondary repolarization abnormality Anterior infarct, old Minimal ST elevation, inferior leads Prolonged QT interval Confirmed by Randol Simmonds [phone]) on 11/23/2024 8:50:40 AM  Radiology: CT Head Wo Contrast Result Date: 11/23/2024 EXAM: CT HEAD WITHOUT  CONTRAST 11/23/2024 09:30:15 AM TECHNIQUE: CT of the head was performed without the administration of intravenous contrast. Automated exposure control, iterative reconstruction, and/or weight based adjustment of the mA/kV was utilized to reduce the radiation dose to as low as reasonably achievable. COMPARISON: CT Head 04/15/2024, Brain MRI 04/16/2024, CT cervical spine reported separately today. CLINICAL HISTORY: 83 year old female with minor head trauma after a fall at 8:15 PM. FINDINGS: BRAIN AND VENTRICLES: No acute hemorrhage. No evidence of acute infarct. No hydrocephalus. No extra-axial collection. No mass effect or midline shift. Pronounced chronic anterior left frontal lobe and  anterior bitemporal encephalomalacia is stable. Stable brain volume. Stable ex vacuo ventricular enlargement. Stable gray white differentiation. Chronic dural calcification incidentally noted. No suspicious intracranial vascular hyperdensity. Advanced calcified atherosclerosis at the skull base. ORBITS: No acute abnormality. SINUSES: Chronic paranasal sinusitis with areas of advanced mucoperiosteal thickening is stable. SOFT TISSUES AND SKULL: No acute soft tissue abnormality. No skull fracture. Tympanic cavities and mastoids remain well aerated. IMPRESSION: 1. No acute traumatic injury identified. 2. Stable chronic left frontal and bitemporal encephalomalacia. 3. Stable chronic paranasal sinus disease. Electronically signed by: Helayne Hurst MD 11/23/2024 10:00 AM EST RP Workstation: [id number]   CT Cervical Spine Wo Contrast Result Date: 11/23/2024 EXAM: CT CERVICAL SPINE WITHOUT CONTRAST 11/23/2024 09:30:15 AM TECHNIQUE: CT of the cervical spine was performed without the administration of intravenous contrast. Multiplanar reformatted images are provided for review. Automated exposure control, iterative reconstruction, and/or weight-based adjustment of the mA/kV was utilized to reduce the radiation dose to as low as reasonably  achievable. COMPARISON: CT head reported separately today, previous cervical spine CT on 02/06/2019, and CTA head and neck on 04/15/2024. CLINICAL HISTORY: 83 year old female, fell at 8:15 PM. 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 No significant change in mild distraction and displacement since 2020, and the chronic odontoid fragment does appear solidly fused with the anterior C1 ring. 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 However, chronic anterior cervical fusion hardware spanning from C3 to C7 now demonstrates severe loosening and bone resorption at the C7 vertebral body level (series 6, image 44) and C6-C7 pseudarthrosis. Developing degenerative bilateral C6-C7 facet ankylosis, which is new from 2020. Partially visible upper thoracic spinal hyperostosis and interbody ankylosis also. C3 cortical screw loosening is less pronounced but also substantially progressed since 2020 (series 2, image 40). No superimposed acute osseous abnormality identified in the cervical spine. DEGENERATIVE CHANGES: No strong CT evidence of cervical spinal stenosis. SOFT TISSUES: Prevertebral soft tissue thickening at the cervicothoracic junction appears chronic but has progressed (series 8, image 67), but no C7 or T1 endplate erosion is associated. Stable lung apex ventilation. Partially visible left chest cardiac pacemaker device. Calcified cervical carotid artery atherosclerosis. IMPRESSION: 1. Since May 2025 substantial hardware loosening and bone resorption at C7-T1 , also C3. Associated nonspecific prevertebral soft tissue swelling there. But but no strong CT evidence of discitis osteomyelitis. Associated C6-C7 pseudarthrosis, superimposed on otherwise solid C2 through  C6 spinal fusion. 2. No superimposed acute traumatic injury identified in the cervical spine. 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 Electronically signed by: Helayne Hurst MD 11/23/2024 09:58 AM EST RP Workstation: [id number]   DG Hip Unilat With Pelvis 2-3 Views Right Result Date: 11/23/2024 CLINICAL DATA:  Fall.  Right hip pain. EXAM: DG HIP (WITH OR WITHOUT PELVIS) 2-3V RIGHT COMPARISON:  None Available. FINDINGS: Acute, mildly displaced right femoral neck fracture is seen. No evidence of hip dislocation. No pelvic fracture identified. Mild pubic symphysis degenerative changes are seen. Peripheral vascular calcification also noted. IMPRESSION: Acute, mildly displaced right femoral neck fracture. Electronically Signed   By: [name] DELENA Kil M.D.   On: 11/23/2024 09:43     Procedures   Medications Ordered in the ED  0.9 %  sodium chloride  infusion (has no administration in time range)  sodium chloride  flush (NS) 0.9 % injection 3 mL (has  no administration in time range)  acetaminophen  (TYLENOL ) tablet 650 mg (has no administration in time range)    Or  acetaminophen  (TYLENOL ) suppository 650 mg (has no administration in time range)  oxyCODONE  (Oxy IR/ROXICODONE ) immediate release tablet 5 mg (has no administration in time range)  HYDROmorphone  (DILAUDID ) injection 0.5-1 mg (has no administration in time range)  traZODone  (DESYREL ) tablet 25 mg (has no administration in time range)  bisacodyl  (DULCOLAX) EC tablet 5 mg (has no administration in time range)  sodium phosphate  (FLEET) enema 1 enema (has no administration in time range)  ipratropium (ATROVENT ) nebulizer solution 0.5 mg (has no administration in time range)  hydrALAZINE  (APRESOLINE ) injection 10 mg (has no administration in time range)  heparin  injection 5,000 Units (has no administration in time range)  senna-docusate (Senokot-S) tablet 1 tablet (has no administration in time range)  morphine  (PF) 4 MG/ML injection 4  mg (4 mg Intravenous Given 11/23/24 0936)    Clinical Course as of 11/23/24 1029  Sun Nov 23, 2024  0955 CBC with Differential(!) CBC and metabolic panel normal [JK]  0956 Hip x-ray shows a right femoral neck fracture [JK]    Clinical Course User Index [JK] Randol Simmonds, MD                                 Medical Decision Making Problems Addressed: Closed fracture of right hip, initial encounter [facility]): acute illness or injury that poses a threat to life or bodily functions  Amount and/or Complexity of Data Reviewed Labs: ordered. Decision-making details documented in ED Course. Radiology: ordered and independent interpretation performed.  Risk Prescription drug management.   Patient presented to the ED for evaluation after mechanical fall.  Patient has multiple comorbidities.  At baseline is very frail and is receiving hospice care at home.  On exam patient had notable right hip tenderness.  Findings were concerning for the possibility of hip fracture.  X-rays confirm a femoral neck fracture.  Labs do not show any signs of anemia or dehydration.  Head CT and C-spine CT did not show any signs of fracture.  C-spine hardware findings noted.    Case discussed with Dr Rosanne regarding admission.  Case discussed with Dr Sherida, orthopedics who will see pt.     Final diagnoses:  Closed fracture of right hip, initial encounter [facility])    ED Discharge Orders     None          Randol Simmonds, MD 11/23/24 1029  "

## 2024-11-23 NOTE — Assessment & Plan Note (Signed)
 Aortic stenosis, status post TAVR, 02/06/23.  No complication now no chronic anticoagulation 5, aspirin 

## 2024-11-23 NOTE — Progress Notes (Signed)
 [facility] Hospitalized Patient Note  [name] is a current hospice patient followed at home for terminal diagnosis of CVD.  Patient experienced a fall last night.  Hospice nurse made a visit.  Patient with complaint of hip pain and sent to the ED for further evaluation.  Patient was admitted to [facility] on 12.28.25 with closed right hip fracture.  Per Dr. Eric, hospice physician, this is a related hospitalization.  Patient in in the ED for evaluation post fall at home.  Patient tripped over her feet.  X-ray showed right hip fracture.  Patient is pending orthopedic consult.  Hospital liaison team will follow through final disposition.  Patient is appropriate for hospice GIP level of care requiring IV pain meds to manage current right hip fracture.  Vital Signs:  T98.1, BP 168/68, P 82, R 18, Oxi 96%  Abnormal Labs: Cl 97, Glucose 203, WBC 13.9, Neutroabs 11.9,   IV/PRN Meds: Dilaudid  1mg  IV @ 1:12 p Morphine  4mg  IV @ 08:50a & 09:36a  Diagnostics: CT Head Wo Contrast Result Date: 11/23/2024 EXAM: CT HEAD WITHOUT CONTRAST 11/23/2024 09:30:15 AM TECHNIQUE: CT of the head was performed without the administration of intravenous contrast. Automated exposure control, iterative reconstruction, and/or weight based adjustment of the mA/kV was utilized to reduce the radiation dose to as low as reasonably achievable. COMPARISON: CT Head 04/15/2024, Brain MRI 04/16/2024, CT cervical spine reported separately today. CLINICAL HISTORY: 83 year old female with minor head trauma after a fall at 8:15 PM. FINDINGS: BRAIN AND VENTRICLES: No acute hemorrhage. No evidence of acute infarct. No hydrocephalus. No extra-axial collection. No mass effect or midline shift. Pronounced chronic anterior left frontal lobe and anterior bitemporal encephalomalacia is stable. Stable brain volume. Stable ex vacuo ventricular enlargement. Stable gray white differentiation. Chronic dural  calcification incidentally noted. No suspicious intracranial vascular hyperdensity. Advanced calcified atherosclerosis at the skull base. ORBITS: No acute abnormality. SINUSES: Chronic paranasal sinusitis with areas of advanced mucoperiosteal thickening is stable. SOFT TISSUES AND SKULL: No acute soft tissue abnormality. No skull fracture. Tympanic cavities and mastoids remain well aerated. IMPRESSION: 1. No acute traumatic injury identified. 2. Stable chronic left frontal and bitemporal encephalomalacia. 3. Stable chronic paranasal sinus disease. Electronically signed by: Helayne Hurst MD 11/23/2024 10:00 AM EST RP Workstation: [id number]    CT Cervical Spine Wo Contrast Result Date: 11/23/2024 EXAM: CT CERVICAL SPINE WITHOUT CONTRAST 11/23/2024 09:30:15 AM TECHNIQUE: CT of the cervical spine was performed without the administration of intravenous contrast. Multiplanar reformatted images are provided for review. Automated exposure control, iterative reconstruction, and/or weight-based adjustment of the mA/kV was utilized to reduce the radiation dose to as low as reasonably achievable. COMPARISON: CT head reported separately today, previous cervical spine CT on 02/06/2019, and CTA head and neck on 04/15/2024. CLINICAL HISTORY: 83 year old female, fell at 8:15 PM. 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 No significant change in mild distraction and displacement since 2020, and the chronic odontoid fragment does appear solidly fused with the anterior C1 ring. 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 However, chronic anterior cervical fusion hardware spanning from C3 to C7 now demonstrates severe loosening and bone resorption at the C7 vertebral body level  (series 6, image 44) and C6-C7 pseudarthrosis. Developing degenerative bilateral C6-C7 facet ankylosis, which is new from 2020.  Partially visible upper thoracic spinal hyperostosis and interbody ankylosis also. C3 cortical screw loosening is less pronounced but also substantially progressed since 2020 (series 2, image 40). No superimposed acute osseous abnormality identified in the cervical spine. DEGENERATIVE CHANGES: No strong CT evidence of cervical spinal stenosis. SOFT TISSUES: Prevertebral soft tissue thickening at the cervicothoracic junction appears chronic but has progressed (series 8, image 67), but no C7 or T1 endplate erosion is associated. Stable lung apex ventilation. Partially visible left chest cardiac pacemaker device. Calcified cervical carotid artery atherosclerosis. IMPRESSION: 1. Since May 2025 substantial hardware loosening and bone resorption at C7-T1 , also C3. Associated nonspecific prevertebral soft tissue swelling there. But but no strong CT evidence of discitis osteomyelitis. Associated C6-C7 pseudarthrosis, superimposed on otherwise solid C2 through C6 spinal fusion. 2. No superimposed acute traumatic injury identified in the cervical spine. 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 Electronically signed by: Helayne Hurst MD 11/23/2024 09:58 AM EST RP Workstation: [id number]    DG Hip Unilat With Pelvis 2-3 Views Right Result Date: 11/23/2024 CLINICAL DATA:  Fall.  Right hip pain. EXAM: DG HIP (WITH OR WITHOUT PELVIS) 2-3V RIGHT COMPARISON:  None Available. FINDINGS: Acute, mildly displaced right femoral neck fracture is seen. No evidence of hip dislocation. No pelvic fracture identified. Mild pubic symphysis degenerative changes are seen. Peripheral vascular calcification also noted. IMPRESSION: Acute, mildly displaced right femoral neck fracture. Electronically Signed   By: [name] DELENA Kil M.D.   On: 11/23/2024 09:43     Assessment and Plan:  per Adriana Grams MD, H&P  12.28.25 Closed right hip fracture (HCC) - Status post mechanical fall overnight Sustaining a right hip trauma, pain, subsequently confirmed fracture   CT head/cervical spine, chronic odontoid fracture, hardware in place no acute fractures identified   X-ray right hip: Acute, mildly displaced right femoral neck fracture.    - N.p.o., gentle IV fluid hydration -EDP consulted orthopedic team, pending further evaluation recommendation   CKD (chronic kidney disease), stage III (HCC) BUN/creatinine at baseline -Monitor closely, avoid nephrotoxins, hypotension   Essential hypertension, benign Mild tach blood pressure, currently stable continue metoprolol    Diabetes mellitus type 1 (HCC) History of diabetes mellitus type 1, patient is on insulin  at home not on insulin  pump -Last A1c 9.7-7 months ago -Checking A1c -While n.p.o. will check her CBG every 4 hours, SSI coverage -Once tolerating p.o. resuming home regimen   Seizures (HCC) - Per patient and daughter questionable absence seizure episodes -Patient has been compliant with her Keppra  -Will continue Keppra  250 mg p.o. twice daily -No seizure like activity described with this event of fall   Protein-calorie malnutrition, severe Height 5 2, weight 39.5 kg Severe protein malnutrition, continue dietary supplements once patient can tolerate p.o.     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 Noted-patient remained stable  - Denies any chest pain, or palpitation -Continuing aspirin , Toprol    Anemia of chronic disease History of anemia of chronic disease, currently H&H is stable -Will monitor closely    Dyslipidemia Continue statins   Pacemaker In place-noted   Bilateral carotid artery disease Continue statins, aspirin  once a day   Intracerebral hemorrhage (HCC) In 2020, no further complication, or debility, stable ambulates  with cane CT head today revealed no acute intracranial abnormalities, chronic changes   Discharge Plan-  ongoing- this is patient's 1st day of admission IDT- updated Family Contact-  Daughter has been communicating and speaking with hospice SW- Sharman Hamilton. Daughter was supposed to leave for 2 week cruise in the morning- but she has cancelled her trip. Goals of Care:  Full code  Hospice episode detail summary report sent for upload to patient's records.  Please call with any hospice related questions or concerns.  Saddie HILARIO Na, RN Nurse Liaison [phone]

## 2024-11-23 NOTE — Care Plan (Signed)
 34F with mechanical fall. Found to have right femoral neck fracture  Plan for OR tomorrow for hip hemiarthroplasty vs total hip arthroplasty  NPO at midnight Hold AM DVT ppx

## 2024-11-23 NOTE — Assessment & Plan Note (Signed)
-   Per patient and daughter questionable absence seizure episodes -Patient has been compliant with her Keppra  -Will continue Keppra  250 mg p.o. twice daily -No seizure like activity described with this event of fall

## 2024-11-23 NOTE — Assessment & Plan Note (Signed)
-   Status post mechanical fall overnight Sustaining a right hip trauma, pain, subsequently confirmed fracture  CT head/cervical spine, chronic odontoid fracture, hardware in place no acute fractures identified  X-ray right hip: Acute, mildly displaced right femoral neck fracture.   - N.p.o., gentle IV fluid hydration -EDP consulted orthopedic team, pending further evaluation recommendation

## 2024-11-24 ENCOUNTER — Encounter (HOSPITAL_COMMUNITY): Payer: Self-pay | Admitting: Family Medicine

## 2024-11-24 ENCOUNTER — Observation Stay (HOSPITAL_COMMUNITY): Admitting: Anesthesiology

## 2024-11-24 ENCOUNTER — Encounter (HOSPITAL_COMMUNITY)
Admission: EM | Disposition: A | Discharge status: Skilled Nursing Facility | Payer: Self-pay | Source: Home / Self Care | Attending: Internal Medicine

## 2024-11-24 ENCOUNTER — Inpatient Hospital Stay (HOSPITAL_COMMUNITY)

## 2024-11-24 DIAGNOSIS — E785 Hyperlipidemia, unspecified: Secondary | ICD-10-CM | POA: Diagnosis present

## 2024-11-24 DIAGNOSIS — J9601 Acute respiratory failure with hypoxia: Secondary | ICD-10-CM

## 2024-11-24 DIAGNOSIS — R54 Age-related physical debility: Secondary | ICD-10-CM | POA: Diagnosis present

## 2024-11-24 DIAGNOSIS — S72001A Fracture of unspecified part of neck of right femur, initial encounter for closed fracture: Secondary | ICD-10-CM

## 2024-11-24 DIAGNOSIS — M4854XA Collapsed vertebra, not elsewhere classified, thoracic region, initial encounter for fracture: Secondary | ICD-10-CM | POA: Diagnosis present

## 2024-11-24 DIAGNOSIS — W010XXA Fall on same level from slipping, tripping and stumbling without subsequent striking against object, initial encounter: Secondary | ICD-10-CM | POA: Diagnosis present

## 2024-11-24 DIAGNOSIS — R269 Unspecified abnormalities of gait and mobility: Secondary | ICD-10-CM | POA: Diagnosis present

## 2024-11-24 DIAGNOSIS — Z79899 Other long term (current) drug therapy: Secondary | ICD-10-CM | POA: Diagnosis not present

## 2024-11-24 DIAGNOSIS — I10 Essential (primary) hypertension: Secondary | ICD-10-CM | POA: Diagnosis not present

## 2024-11-24 DIAGNOSIS — Z794 Long term (current) use of insulin: Secondary | ICD-10-CM | POA: Diagnosis not present

## 2024-11-24 DIAGNOSIS — N183 Chronic kidney disease, stage 3 unspecified: Secondary | ICD-10-CM

## 2024-11-24 DIAGNOSIS — Z952 Presence of prosthetic heart valve: Secondary | ICD-10-CM | POA: Diagnosis not present

## 2024-11-24 DIAGNOSIS — E1065 Type 1 diabetes mellitus with hyperglycemia: Secondary | ICD-10-CM | POA: Diagnosis present

## 2024-11-24 DIAGNOSIS — E1022 Type 1 diabetes mellitus with diabetic chronic kidney disease: Secondary | ICD-10-CM | POA: Diagnosis present

## 2024-11-24 DIAGNOSIS — R64 Cachexia: Secondary | ICD-10-CM | POA: Diagnosis present

## 2024-11-24 DIAGNOSIS — I129 Hypertensive chronic kidney disease with stage 1 through stage 4 chronic kidney disease, or unspecified chronic kidney disease: Secondary | ICD-10-CM

## 2024-11-24 DIAGNOSIS — G40909 Epilepsy, unspecified, not intractable, without status epilepticus: Secondary | ICD-10-CM | POA: Diagnosis present

## 2024-11-24 DIAGNOSIS — Y92009 Unspecified place in unspecified non-institutional (private) residence as the place of occurrence of the external cause: Secondary | ICD-10-CM | POA: Diagnosis not present

## 2024-11-24 DIAGNOSIS — E10649 Type 1 diabetes mellitus with hypoglycemia without coma: Secondary | ICD-10-CM | POA: Diagnosis not present

## 2024-11-24 DIAGNOSIS — Z881 Allergy status to other antibiotic agents status: Secondary | ICD-10-CM | POA: Diagnosis not present

## 2024-11-24 DIAGNOSIS — D72829 Elevated white blood cell count, unspecified: Secondary | ICD-10-CM | POA: Diagnosis not present

## 2024-11-24 DIAGNOSIS — E43 Unspecified severe protein-calorie malnutrition: Secondary | ICD-10-CM | POA: Diagnosis present

## 2024-11-24 DIAGNOSIS — Z7982 Long term (current) use of aspirin: Secondary | ICD-10-CM | POA: Diagnosis not present

## 2024-11-24 DIAGNOSIS — D638 Anemia in other chronic diseases classified elsewhere: Secondary | ICD-10-CM | POA: Diagnosis present

## 2024-11-24 DIAGNOSIS — Z515 Encounter for palliative care: Secondary | ICD-10-CM | POA: Diagnosis not present

## 2024-11-24 DIAGNOSIS — Z950 Presence of cardiac pacemaker: Secondary | ICD-10-CM | POA: Diagnosis not present

## 2024-11-24 DIAGNOSIS — D62 Acute posthemorrhagic anemia: Secondary | ICD-10-CM | POA: Diagnosis not present

## 2024-11-24 DIAGNOSIS — N182 Chronic kidney disease, stage 2 (mild): Secondary | ICD-10-CM | POA: Diagnosis present

## 2024-11-24 DIAGNOSIS — Z66 Do not resuscitate: Secondary | ICD-10-CM | POA: Diagnosis present

## 2024-11-24 HISTORY — PX: IRRIGATION AND DEBRIDEMENT ELBOW: SHX6886

## 2024-11-24 HISTORY — PX: ANTERIOR APPROACH HEMI HIP ARTHROPLASTY: SHX6690

## 2024-11-24 LAB — GLUCOSE, CAPILLARY
Glucose-Capillary: 321 mg/dL — ABNORMAL HIGH (ref 70–99)
Glucose-Capillary: 285 mg/dL — ABNORMAL HIGH (ref 70–99)
Glucose-Capillary: 203 mg/dL — ABNORMAL HIGH (ref 70–99)
Glucose-Capillary: 166 mg/dL — ABNORMAL HIGH (ref 70–99)
Glucose-Capillary: 163 mg/dL — ABNORMAL HIGH (ref 70–99)
Glucose-Capillary: 184 mg/dL — ABNORMAL HIGH (ref 70–99)
Glucose-Capillary: 200 mg/dL — ABNORMAL HIGH (ref 70–99)

## 2024-11-24 LAB — CBC
HCT: 34.5 % — ABNORMAL LOW (ref 36.0–46.0)
Hemoglobin: 11.1 g/dL — ABNORMAL LOW (ref 12.0–15.0)
MCH: 27.9 pg (ref 26.0–34.0)
MCHC: 32.2 g/dL (ref 30.0–36.0)
MCV: 86.7 fL (ref 80.0–100.0)
Platelets: 229 K/uL (ref 150–400)
RBC: 3.98 MIL/uL (ref 3.87–5.11)
RDW: 15.1 % (ref 11.5–15.5)
WBC: 16.5 K/uL — ABNORMAL HIGH (ref 4.0–10.5)
nRBC: 0 % (ref 0.0–0.2)

## 2024-11-24 LAB — BASIC METABOLIC PANEL WITH GFR
Anion gap: 17 — ABNORMAL HIGH (ref 5–15)
BUN: 23 mg/dL (ref 8–23)
CO2: 20 mmol/L — ABNORMAL LOW (ref 22–32)
Calcium: 8.4 mg/dL — ABNORMAL LOW (ref 8.9–10.3)
Chloride: 99 mmol/L (ref 98–111)
Creatinine, Ser: 0.76 mg/dL (ref 0.44–1.00)
GFR, Estimated: >60 mL/min
Glucose, Bld: 322 mg/dL — ABNORMAL HIGH (ref 70–99)
Potassium: 4.4 mmol/L (ref 3.5–5.1)
Sodium: 136 mmol/L (ref 135–145)

## 2024-11-24 LAB — SURGICAL PCR SCREEN
MRSA, PCR: NEGATIVE
Staphylococcus aureus: NEGATIVE

## 2024-11-24 LAB — APTT: aPTT: 34 s (ref 24–36)

## 2024-11-24 LAB — PROTIME-INR
INR: 1.1 (ref 0.8–1.2)
Prothrombin Time: 14.5 s (ref 11.4–15.2)

## 2024-11-24 MED ORDER — POVIDONE-IODINE 10 % EX SWAB: 2.0000 | Freq: Once | CUTANEOUS | Status: DC

## 2024-11-24 MED ORDER — ORAL CARE MOUTH RINSE: 15.0000 mL | Freq: Once | OROMUCOSAL | Status: DC

## 2024-11-24 MED ORDER — DOCUSATE SODIUM 100 MG PO CAPS
100.0000 mg | ORAL_CAPSULE | Freq: Two times a day (BID) | ORAL | Status: DC
  Administered 2024-11-24 – 2024-11-27 (×6): 100 mg via ORAL
  Filled 2024-11-24 (×8): qty 1

## 2024-11-24 MED ORDER — OXYCODONE HCL 5 MG PO TABS
2.5000 mg | ORAL_TABLET | ORAL | Status: DC | PRN
  Administered 2024-11-25 – 2024-11-27 (×3): 5 mg via ORAL
  Filled 2024-11-24 (×3): qty 1

## 2024-11-24 MED ORDER — PROPOFOL 10 MG/ML IV BOLUS
INTRAVENOUS | Status: AC
  Filled 2024-11-24: qty 20

## 2024-11-24 MED ORDER — TRANEXAMIC ACID-NACL 1000-0.7 MG/100ML-% IV SOLN
1000.0000 mg | Freq: Once | INTRAVENOUS | Status: AC
  Administered 2024-11-24: 1000 mg via INTRAVENOUS
  Filled 2024-11-24: qty 100

## 2024-11-24 MED ORDER — PHENYLEPHRINE HCL-NACL 20-0.9 MG/250ML-% IV SOLN
INTRAVENOUS | Status: DC | PRN
  Administered 2024-11-24: 30 ug/min via INTRAVENOUS

## 2024-11-24 MED ORDER — PROPOFOL 10 MG/ML IV BOLUS
INTRAVENOUS | Status: DC | PRN
  Administered 2024-11-24: 50 mg via INTRAVENOUS

## 2024-11-24 MED ORDER — ALBUMIN HUMAN 5 % IV SOLN: INTRAVENOUS | Status: DC | PRN

## 2024-11-24 MED ORDER — ASPIRIN 81 MG PO TBEC
81.0000 mg | DELAYED_RELEASE_TABLET | Freq: Two times a day (BID) | ORAL | Status: DC
  Administered 2024-11-25 – 2024-11-28 (×7): 81 mg via ORAL
  Filled 2024-11-24 (×7): qty 1

## 2024-11-24 MED ORDER — METOCLOPRAMIDE HCL 5 MG PO TABS: 5.0000 mg | ORAL_TABLET | Freq: Three times a day (TID) | ORAL | Status: DC | PRN

## 2024-11-24 MED ORDER — MUPIROCIN 2 % EX OINT
1.0000 | TOPICAL_OINTMENT | Freq: Two times a day (BID) | CUTANEOUS | Status: DC
  Administered 2024-11-24 – 2024-11-28 (×8): 1 via NASAL
  Filled 2024-11-24: qty 22

## 2024-11-24 MED ORDER — LEVOFLOXACIN IN D5W 500 MG/100ML IV SOLN
500.0000 mg | INTRAVENOUS | Status: AC
  Administered 2024-11-24: 500 mg via INTRAVENOUS
  Filled 2024-11-24: qty 100

## 2024-11-24 MED ORDER — CHLORHEXIDINE GLUCONATE 0.12 % MT SOLN
15.0000 mL | Freq: Once | OROMUCOSAL | Status: DC
  Filled 2024-11-24: qty 15

## 2024-11-24 MED ORDER — HYDROMORPHONE HCL 1 MG/ML IJ SOLN: 0.5000 mg | INTRAMUSCULAR | Status: DC | PRN

## 2024-11-24 MED ORDER — SUGAMMADEX SODIUM 200 MG/2ML IV SOLN
INTRAVENOUS | Status: DC | PRN
  Administered 2024-11-24: 200 mg via INTRAVENOUS

## 2024-11-24 MED ORDER — INSULIN GLARGINE 100 UNIT/ML ~~LOC~~ SOLN
3.0000 [IU] | Freq: Every day | SUBCUTANEOUS | Status: DC
  Administered 2024-11-24: 3 [IU] via SUBCUTANEOUS
  Filled 2024-11-24 (×2): qty 0.03

## 2024-11-24 MED ORDER — METHOCARBAMOL 500 MG PO TABS
500.0000 mg | ORAL_TABLET | Freq: Three times a day (TID) | ORAL | Status: DC | PRN
  Administered 2024-11-25 – 2024-11-27 (×2): 500 mg via ORAL
  Filled 2024-11-24 (×2): qty 1

## 2024-11-24 MED ORDER — ROCURONIUM BROMIDE 10 MG/ML (PF) SYRINGE
PREFILLED_SYRINGE | INTRAVENOUS | Status: DC | PRN
  Administered 2024-11-24: 50 mg via INTRAVENOUS

## 2024-11-24 MED ORDER — MENTHOL 3 MG MT LOZG: 1.0000 | LOZENGE | OROMUCOSAL | Status: DC | PRN

## 2024-11-24 MED ORDER — LACTATED RINGERS IV SOLN: INTRAVENOUS | Status: DC

## 2024-11-24 MED ORDER — ACETAMINOPHEN 10 MG/ML IV SOLN: 1000.0000 mg | Freq: Once | INTRAVENOUS | Status: DC | PRN

## 2024-11-24 MED ORDER — LACTATED RINGERS IV SOLN: INTRAVENOUS | Status: DC | PRN

## 2024-11-24 MED ORDER — FENTANYL CITRATE (PF) 250 MCG/5ML IJ SOLN
INTRAMUSCULAR | Status: AC
  Filled 2024-11-24: qty 5

## 2024-11-24 MED ORDER — TRANEXAMIC ACID-NACL 1000-0.7 MG/100ML-% IV SOLN
1000.0000 mg | INTRAVENOUS | Status: AC
  Administered 2024-11-24: 1000 mg via INTRAVENOUS
  Filled 2024-11-24: qty 100

## 2024-11-24 MED ORDER — OXYCODONE HCL 5 MG/5ML PO SOLN: 5.0000 mg | Freq: Once | ORAL | Status: DC | PRN

## 2024-11-24 MED ORDER — CHLORHEXIDINE GLUCONATE 4 % EX SOLN
60.0000 mL | Freq: Once | CUTANEOUS | Status: DC
  Filled 2024-11-24: qty 60

## 2024-11-24 MED ORDER — VANCOMYCIN HCL 1000 MG IV SOLR
INTRAVENOUS | Status: AC
  Filled 2024-11-24: qty 20

## 2024-11-24 MED ORDER — SODIUM CHLORIDE 0.9 % IV SOLN: INTRAVENOUS | Status: AC

## 2024-11-24 MED ORDER — OXYCODONE HCL 5 MG PO TABS: 5.0000 mg | ORAL_TABLET | Freq: Once | ORAL | Status: DC | PRN

## 2024-11-24 MED ORDER — DEXAMETHASONE SOD PHOSPHATE PF 10 MG/ML IJ SOLN
INTRAMUSCULAR | Status: DC | PRN
  Administered 2024-11-24: 4 mg via INTRAVENOUS

## 2024-11-24 MED ORDER — 0.9 % SODIUM CHLORIDE (POUR BTL) OPTIME
TOPICAL | Status: DC | PRN
  Administered 2024-11-24: 1000 mL

## 2024-11-24 MED ORDER — FENTANYL CITRATE (PF) 250 MCG/5ML IJ SOLN
INTRAMUSCULAR | Status: DC | PRN
  Administered 2024-11-24: 100 ug via INTRAVENOUS
  Administered 2024-11-24: 50 ug via INTRAVENOUS

## 2024-11-24 MED ORDER — SODIUM CHLORIDE 0.9 % IV BOLUS
250.0000 mL | Freq: Once | INTRAVENOUS | Status: AC
  Administered 2024-11-24: 250 mL via INTRAVENOUS

## 2024-11-24 MED ORDER — PHENYLEPHRINE 80 MCG/ML (10ML) SYRINGE FOR IV PUSH (FOR BLOOD PRESSURE SUPPORT)
PREFILLED_SYRINGE | INTRAVENOUS | Status: DC | PRN
  Administered 2024-11-24 (×2): 160 ug via INTRAVENOUS
  Administered 2024-11-24: 80 ug via INTRAVENOUS
  Administered 2024-11-24: 160 ug via INTRAVENOUS

## 2024-11-24 MED ORDER — MONTELUKAST SODIUM 10 MG PO TABS
10.0000 mg | ORAL_TABLET | Freq: Every day | ORAL | Status: DC
  Administered 2024-11-24 – 2024-11-27 (×4): 10 mg via ORAL
  Filled 2024-11-24 (×4): qty 1

## 2024-11-24 MED ORDER — INSULIN ASPART 100 UNIT/ML IJ SOLN
0.0000 [IU] | INTRAMUSCULAR | Status: DC
  Administered 2024-11-24: 5 [IU] via SUBCUTANEOUS
  Administered 2024-11-24: 3 [IU] via SUBCUTANEOUS
  Administered 2024-11-24 – 2024-11-25 (×2): 2 [IU] via SUBCUTANEOUS
  Administered 2024-11-25 (×2): 5 [IU] via SUBCUTANEOUS
  Administered 2024-11-25: 3 [IU] via SUBCUTANEOUS
  Administered 2024-11-26: 1 [IU] via SUBCUTANEOUS
  Filled 2024-11-24 (×2): qty 5
  Filled 2024-11-24: qty 1
  Filled 2024-11-24: qty 2
  Filled 2024-11-24 (×2): qty 5
  Filled 2024-11-24: qty 2
  Filled 2024-11-24: qty 3
  Filled 2024-11-24: qty 5

## 2024-11-24 MED ORDER — METHOCARBAMOL 1000 MG/10ML IJ SOLN: 500.0000 mg | Freq: Three times a day (TID) | INTRAMUSCULAR | Status: DC | PRN

## 2024-11-24 MED ORDER — PHENOL 1.4 % MT LIQD: 1.0000 | OROMUCOSAL | Status: DC | PRN

## 2024-11-24 MED ORDER — METOCLOPRAMIDE HCL 5 MG/ML IJ SOLN: 5.0000 mg | Freq: Three times a day (TID) | INTRAMUSCULAR | Status: DC | PRN

## 2024-11-24 MED ORDER — FENTANYL CITRATE (PF) 100 MCG/2ML IJ SOLN: 25.0000 ug | INTRAMUSCULAR | Status: DC | PRN

## 2024-11-24 NOTE — Care Plan (Signed)
" ° ° ° °  Referral received for Danielle Clarke: goals of care discussion. Chart reviewed. Patient is an Physicist, Medical hospice patient. Spoke to hospice liaison and there are no PMT needs at this time. [facility] will reach out to PMT if needs arise.  Thank you for your referral and allowing PMT to assist in Sharline Lehane Charlet's care.   Stephane Palin, NP Palliative Medicine Team  Team Phone # [phone]   NO CHARGE  "

## 2024-11-24 NOTE — Transfer of Care (Signed)
 Immediate Anesthesia Transfer of Care Note  Patient: Danielle Clarke  Procedure(s) Performed: HEMIARTHROPLASTY, HIP, DIRECT ANTERIOR APPROACH, FOR FRACTURE (Right) IRRIGATION AND DEBRIDEMENT ELBOW WITH WOUND CLOSURE (Right: Elbow)  Patient Location: PACU  Anesthesia Type:General  Level of Consciousness: awake, alert , and oriented  Airway & Oxygen Therapy: Patient Spontanous Breathing and Patient connected to nasal cannula oxygen  Post-op Assessment: Report given to RN and Post -op Vital signs reviewed and stable  Post vital signs: Reviewed and stable  Last Vitals:  Vitals Value Taken Time  BP 154/53 11/24/24 17:37  Temp    Pulse 90 11/24/24 17:40  Resp 28 11/24/24 17:40  SpO2 85 % 11/24/24 17:40  Vitals shown include unfiled device data.  Last Pain:  Vitals:   11/24/24 1339  TempSrc: Oral  PainSc:          Complications: No notable events documented.

## 2024-11-24 NOTE — Progress Notes (Signed)
 OT Cancellation Note  Patient Details Name: Danielle Clarke MRN: [id number] DOB: 1941-04-14   Cancelled Treatment:    Reason Eval/Treat Not Completed: Other (comment). Plan for OR this date, 11/24/24, for hemiarthroplasty vs total hip arthoplasty. OT to follow-up as medically appropriate and schedule allows.   Maurilio CROME, OTR/LSABRA  [facility] Acute Rehabilitation  Office: [phone]   Maurilio [name] 11/24/2024, 7:22 AM

## 2024-11-24 NOTE — TOC CAGE-AID Note (Signed)
 Transition of Care [facility]) - CAGE-AID Screening  Patient Details  Name: Danielle Clarke MRN: [id number] Date of Birth: [birth year]  Clinical Narrative:  Patient was admitted after a fall at home causing a hip fx. Patient is a current hospice patient, has previously endorsed occasional alcohol use (approx 2/week), no previous drug use or cigarettes. Patient does not need substance abuse resources at this time.  CAGE-AID Screening:   Have You Ever Felt You Ought to Cut Down on Your Drinking or Drug Use?: No Have People Annoyed You By Critizing Your Drinking Or Drug Use?: No Have You Felt Bad Or Guilty About Your Drinking Or Drug Use?: No Have You Ever Had a Drink or Used Drugs First Thing In The Morning to Steady Your Nerves or to Get Rid of a Hangover?: No CAGE-AID Score: 0  Substance Abuse Education Offered: No

## 2024-11-24 NOTE — H&P (View-Only) (Signed)
 Reason for Consult:Right hip fx Referring Physician: Mennie Lamy Time called: 0730 Time at bedside: 0925   Danielle Clarke is an 83 y.o. female.  HPI: Donn fell at home over the weekend. She had immediate right hip pain and could not get up. She was brought to the ED where x-rays showed a right hip fx and orthopedic surgery was consulted. She lives at home with daughter and uses a RW to ambulate.  Past Medical History:  Diagnosis Date   Aortic stenosis, mild    Arthritis    Asthmatic bronchitis    with colds per patient   Carotid artery disease    40-59% bilateral ICA stenosis   Cataract    Cutaneous abscess of right foot    Glaucoma    Heart murmur    Dr Claudene is her  cardiologist.    Hyperlipemia    Hypertension    Dr. 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 Wendel and Dr. Maryjane   Syncope    Type 1 diabetes mellitus [facility])     Past Surgical History:  Procedure Laterality Date   ANKLE FRACTURE SURGERY Left 2001   steel plate and 3 screws    ANTERIOR CERVICAL CORPECTOMY N/A 07/31/2014   Procedure: Cervical Four to Cervical Six Corpectomy;  Surgeon: Catalina CHRISTELLA Stains, MD;  Location: [facility] NEURO ORS;  Service: Neurosurgery;  Laterality: N/A;  C4 to C6 Corpectomy   BREAST SURGERY  1988   CATARACT EXTRACTION W/ INTRAOCULAR LENS  IMPLANT, BILATERAL Bilateral 2011   right and then left   EYE SURGERY     FRACTURE SURGERY     HAMMER TOE SURGERY  1998   I & D EXTREMITY Right 09/27/2018   Procedure: IRRIGATION AND DEBRIDEMENT RIGHT FOOT;  Surgeon: Harden Jerona GAILS, MD;  Location: [facility] OR;  Service: Orthopedics;  Laterality: Right;   INTRAOPERATIVE TRANSTHORACIC ECHOCARDIOGRAM N/A 02/06/2023   Procedure: INTRAOPERATIVE TRANSTHORACIC ECHOCARDIOGRAM;  Surgeon: Wendel Lurena POUR, MD;  Location: [facility];  Service: Open Heart Surgery;  Laterality: N/A;    JOINT REPLACEMENT     PACEMAKER IMPLANT N/A 02/10/2019   Procedure: PACEMAKER IMPLANT;  Surgeon: Waddell Danelle ORN, MD;  Location: [facility] INVASIVE CV LAB;  Service: Cardiovascular;  Laterality: N/A;   RIGHT/LEFT HEART CATH AND CORONARY ANGIOGRAPHY N/A 01/11/2023   Procedure: RIGHT/LEFT HEART CATH AND CORONARY ANGIOGRAPHY;  Surgeon: Wendel Lurena POUR, MD;  Location: [facility] INVASIVE CV LAB;  Service: Cardiovascular;  Laterality: N/A;   TONSILLECTOMY AND ADENOIDECTOMY  1948   TOTAL SHOULDER REPLACEMENT  2010   right shoulder    TRANSCATHETER AORTIC VALVE REPLACEMENT, TRANSFEMORAL N/A 02/06/2023   Procedure: Transcatheter Aortic Valve Replacement, Transfemoral;  Surgeon: Thukkani, Arun K, MD;  Location: [facility] OR;  Service: Open Heart Surgery;  Laterality: N/A;   TUBAL LIGATION  1979   TYMPANOSTOMY TUBE PLACEMENT Bilateral     Family History  Problem Relation Age of Onset   Cancer Mother    Heart Problems Father     Social History:  reports that she has never smoked. She has never used smokeless tobacco. She reports current alcohol use of about 2.0 standard drinks of alcohol per week. She reports that she does not use drugs.  Allergies: Allergies[1]  Medications: I have reviewed the patient's current medications.  Results for orders placed or performed  during the hospital encounter of 11/23/24 (from the past 48 hours)  Basic metabolic panel     Status: Abnormal   Collection Time: 11/23/24  8:22 AM  Result Value Ref Range   Sodium 135 135 - 145 mmol/L   Potassium 4.3 3.5 - 5.1 mmol/L   Chloride 97 (L) 98 - 111 mmol/L   CO2 27 22 - 32 mmol/L   Glucose, Bld 203 (H) 70 - 99 mg/dL    Comment: Glucose reference range applies only to samples taken after fasting for at least 8 hours.   BUN 17 8 - 23 mg/dL   Creatinine, Ser 9.28 0.44 - 1.00 mg/dL   Calcium 9.0 8.9 - 89.6 mg/dL   GFR, Estimated >39 >39 mL/min    Comment: (NOTE) Calculated using the CKD-EPI Creatinine Equation (2021)    Anion gap 11 5 - 15     Comment: Performed at [facility], [address] [place], [place] [address]  CBC with Differential     Status: Abnormal   Collection Time: 11/23/24  8:22 AM  Result Value Ref Range   WBC 13.9 (H) 4.0 - 10.5 K/uL   RBC 4.74 3.87 - 5.11 MIL/uL   Hemoglobin 13.2 12.0 - 15.0 g/dL   HCT 59.5 63.9 - 53.9 %   MCV 85.2 80.0 - 100.0 fL   MCH 27.8 26.0 - 34.0 pg   MCHC 32.7 30.0 - 36.0 g/dL   RDW 85.0 88.4 - 84.4 %   Platelets 212 150 - 400 K/uL    Comment: SPECIMEN CHECKED FOR CLOTS   nRBC 0.0 0.0 - 0.2 %   Neutrophils Relative % 84 %   Neutro Abs 11.9 (H) 1.7 - 7.7 K/uL   Lymphocytes Relative 8 %   Lymphs Abs 1.1 0.7 - 4.0 K/uL   Monocytes Relative 5 %   Monocytes Absolute 0.7 0.1 - 1.0 K/uL   Eosinophils Relative 1 %   Eosinophils Absolute 0.1 0.0 - 0.5 K/uL   Basophils Relative 1 %   Basophils Absolute 0.1 0.0 - 0.1 K/uL   Immature Granulocytes 1 %   Abs Immature Granulocytes 0.08 (H) 0.00 - 0.07 K/uL    Comment: Performed at [facility], [address], [place], [place] [address]  Protime-INR     Status: None   Collection Time: 11/23/24  8:22 AM  Result Value Ref Range   Prothrombin Time 13.1 11.4 - 15.2 seconds   INR 0.9 0.8 - 1.2    Comment: (NOTE) INR goal varies based on device and disease states. Performed at [facility], [address], [place], [place] [address]   Magnesium      Status: None   Collection Time: 11/23/24  8:22 AM  Result Value Ref Range   Magnesium  2.0 1.7 - 2.4 mg/dL    Comment: Performed at [facility], [address], [place], [place] [address]  Phosphorus     Status: None   Collection Time: 11/23/24  8:22 AM  Result Value Ref Range   Phosphorus 3.1 2.5 - 4.6 mg/dL    Comment: Performed at [facility], [address], [place], [place] [address]  Hemoglobin A1c     Status: Abnormal   Collection Time: 11/23/24  8:22 AM  Result Value Ref Range   Hgb A1c MFr Bld 9.9 (H) 4.8 - 5.6 %    Comment:  (NOTE) Diagnosis of Diabetes The following HbA1c ranges recommended by the American Diabetes Association (  ADA) may be used as an aid in the diagnosis of diabetes mellitus.  Hemoglobin             Suggested A1C NGSP%              Diagnosis  <5.7                   Non Diabetic  5.7-6.4                Pre-Diabetic  >6.4                   Diabetic  <7.0                   Glycemic control for                       adults with diabetes.     Mean Plasma Glucose 237.43 mg/dL    Comment: Performed at [facility], [address], [place], [place] [address]  Type and screen [facility]     Status: None   Collection Time: 11/23/24  8:45 AM  Result Value Ref Range   ABO/RH(D) A POS    Antibody Screen NEG    Sample Expiration      11/26/2024,2359 Performed at [facility], [address] [place], [place] [address]   CBG monitoring, ED     Status: Abnormal   Collection Time: 11/23/24 12:44 PM  Result Value Ref Range   Glucose-Capillary 239 (H) 70 - 99 mg/dL    Comment: Glucose reference range applies only to samples taken after fasting for at least 8 hours.  Glucose, capillary     Status: Abnormal   Collection Time: 11/23/24  5:23 PM  Result Value Ref Range   Glucose-Capillary 251 (H) 70 - 99 mg/dL    Comment: Glucose reference range applies only to samples taken after fasting for at least 8 hours.  Protime-INR     Status: None   Collection Time: 11/24/24  4:09 AM  Result Value Ref Range   Prothrombin Time 14.5 11.4 - 15.2 seconds   INR 1.1 0.8 - 1.2    Comment: (NOTE) INR goal varies based on device and disease states. Performed at [facility], [address], [place], [place] [address]   APTT     Status: None   Collection Time: 11/24/24  4:09 AM  Result Value Ref Range   aPTT 34 24 - 36 seconds    Comment: Performed at [facility], [address], [place], [place] [address]  CBC     Status: Abnormal   Collection Time: 11/24/24   4:09 AM  Result Value Ref Range   WBC 16.5 (H) 4.0 - 10.5 K/uL   RBC 3.98 3.87 - 5.11 MIL/uL   Hemoglobin 11.1 (L) 12.0 - 15.0 g/dL   HCT 65.4 (L) 63.9 - 53.9 %   MCV 86.7 80.0 - 100.0 fL   MCH 27.9 26.0 - 34.0 pg   MCHC 32.2 30.0 - 36.0 g/dL   RDW 84.8 88.4 - 84.4 %   Platelets 229 150 - 400 K/uL   nRBC 0.0 0.0 - 0.2 %    Comment: Performed at [facility], [address] [place], [place] [address]  Basic metabolic panel     Status: Abnormal   Collection Time: 11/24/24  4:09 AM  Result Value Ref Range  Sodium 136 135 - 145 mmol/L   Potassium 4.4 3.5 - 5.1 mmol/L   Chloride 99 98 - 111 mmol/L   CO2 20 (L) 22 - 32 mmol/L   Glucose, Bld 322 (H) 70 - 99 mg/dL    Comment: Glucose reference range applies only to samples taken after fasting for at least 8 hours.   BUN 23 8 - 23 mg/dL   Creatinine, Ser 9.23 0.44 - 1.00 mg/dL   Calcium 8.4 (L) 8.9 - 10.3 mg/dL   GFR, Estimated >39 >39 mL/min    Comment: (NOTE) Calculated using the CKD-EPI Creatinine Equation (2021)    Anion gap 17 (H) 5 - 15    Comment: Performed at [facility], [address], [place], [place] [address]  Glucose, capillary     Status: Abnormal   Collection Time: 11/24/24  6:43 AM  Result Value Ref Range   Glucose-Capillary 321 (H) 70 - 99 mg/dL    Comment: Glucose reference range applies only to samples taken after fasting for at least 8 hours.  Surgical PCR screen     Status: None   Collection Time: 11/24/24  7:31 AM   Specimen: Nasal Mucosa; Nasal Swab  Result Value Ref Range   MRSA, PCR NEGATIVE NEGATIVE   Staphylococcus aureus NEGATIVE NEGATIVE    Comment: (NOTE) The Xpert SA Assay (FDA approved for NASAL specimens in patients 75 years of age and older), is one component of a comprehensive surveillance program. It is not intended to diagnose infection nor to guide or monitor treatment. Performed at [facility], [address] [place], [place] [address]   Glucose, capillary      Status: Abnormal   Collection Time: 11/24/24  8:12 AM  Result Value Ref Range   Glucose-Capillary 285 (H) 70 - 99 mg/dL    Comment: Glucose reference range applies only to samples taken after fasting for at least 8 hours.    CT Head Wo Contrast Result Date: 11/23/2024 EXAM: CT HEAD WITHOUT CONTRAST 11/23/2024 09:30:15 AM TECHNIQUE: CT of the head was performed without the administration of intravenous contrast. Automated exposure control, iterative reconstruction, and/or weight based adjustment of the mA/kV was utilized to reduce the radiation dose to as low as reasonably achievable. COMPARISON: CT Head 04/15/2024, Brain MRI 04/16/2024, CT cervical spine reported separately today. CLINICAL HISTORY: 83 year old female with minor head trauma after a fall at 8:15 PM. FINDINGS: BRAIN AND VENTRICLES: No acute hemorrhage. No evidence of acute infarct. No hydrocephalus. No extra-axial collection. No mass effect or midline shift. Pronounced chronic anterior left frontal lobe and anterior bitemporal encephalomalacia is stable. Stable brain volume. Stable ex vacuo ventricular enlargement. Stable gray white differentiation. Chronic dural calcification incidentally noted. No suspicious intracranial vascular hyperdensity. Advanced calcified atherosclerosis at the skull base. ORBITS: No acute abnormality. SINUSES: Chronic paranasal sinusitis with areas of advanced mucoperiosteal thickening is stable. SOFT TISSUES AND SKULL: No acute soft tissue abnormality. No skull fracture. Tympanic cavities and mastoids remain well aerated. IMPRESSION: 1. No acute traumatic injury identified. 2. Stable chronic left frontal and bitemporal encephalomalacia. 3. Stable chronic paranasal sinus disease. Electronically signed by: Helayne Hurst MD 11/23/2024 10:00 AM EST RP Workstation: [id number]   CT Cervical Spine Wo Contrast Result Date: 11/23/2024 EXAM: CT CERVICAL SPINE WITHOUT CONTRAST 11/23/2024 09:30:15 AM TECHNIQUE: CT of the  cervical spine was performed without the administration of intravenous contrast. Multiplanar reformatted images are provided for review. Automated exposure control, iterative reconstruction, and/or weight-based adjustment of the mA/kV  was utilized to reduce the radiation dose to as low as reasonably achievable. COMPARISON: CT head reported separately today, previous cervical spine CT on 02/06/2019, and CTA head and neck on 04/15/2024. CLINICAL HISTORY: 83 year old female, fell at 8:15 PM. 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 No significant change in mild distraction and displacement since 2020, and the chronic odontoid fragment does appear solidly fused with the anterior C1 ring. 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 However, chronic anterior cervical fusion hardware spanning from C3 to C7 now demonstrates severe loosening and bone resorption at the C7 vertebral body level (series 6, image 44) and C6-C7 pseudarthrosis. Developing degenerative bilateral C6-C7 facet ankylosis, which is new from 2020. Partially visible upper thoracic spinal hyperostosis and interbody ankylosis also. C3 cortical screw loosening is less pronounced but also substantially progressed since 2020 (series 2, image 40). No superimposed acute osseous abnormality identified in the cervical spine. DEGENERATIVE CHANGES: No strong CT evidence of cervical spinal stenosis. SOFT TISSUES: Prevertebral soft tissue thickening at the cervicothoracic junction appears chronic but has progressed (series 8, image 67), but no C7 or T1 endplate erosion is associated. Stable lung apex ventilation. Partially visible left chest cardiac pacemaker device. Calcified cervical carotid artery  atherosclerosis. IMPRESSION: 1. Since May 2025 substantial hardware loosening and bone resorption at C7-T1 , also C3. Associated nonspecific prevertebral soft tissue swelling there. But but no strong CT evidence of discitis osteomyelitis. Associated C6-C7 pseudarthrosis, superimposed on otherwise solid C2 through C6 spinal fusion. 2. No superimposed acute traumatic injury identified in the cervical spine. 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 Electronically signed by: Helayne Hurst MD 11/23/2024 09:58 AM EST RP Workstation: [id number]   DG Hip Unilat With Pelvis 2-3 Views Right Result Date: 11/23/2024 CLINICAL DATA:  Fall.  Right hip pain. EXAM: DG HIP (WITH OR WITHOUT PELVIS) 2-3V RIGHT COMPARISON:  None Available. FINDINGS: Acute, mildly displaced right femoral neck fracture is seen. No evidence of hip dislocation. No pelvic fracture identified. Mild pubic symphysis degenerative changes are seen. Peripheral vascular calcification also noted. IMPRESSION: Acute, mildly displaced right femoral neck fracture. Electronically Signed   By: [name] DELENA Kil M.D.   On: 11/23/2024 09:43    Review of Systems  HENT:  Negative for ear discharge, ear pain, hearing loss and tinnitus.   Eyes:  Negative for photophobia and pain.  Respiratory:  Negative for cough and shortness of breath.   Cardiovascular:  Negative for chest pain.  Gastrointestinal:  Negative for abdominal pain, nausea and vomiting.  Genitourinary:  Negative for dysuria, flank pain, frequency and urgency.  Musculoskeletal:  Positive for arthralgias (Right hip). Negative for back pain, myalgias and neck pain.  Neurological:  Negative for dizziness and headaches.  Hematological:  Does not bruise/bleed easily.  Psychiatric/Behavioral:  The patient is not nervous/anxious.    Blood pressure (!) 109/45, pulse 94, temperature 98.8 F (37.1 C), temperature source Oral, resp. rate 16, SpO2 98%. Physical Exam Constitutional:      General:  She is not in acute distress.    Appearance: She is well-developed. She is not diaphoretic.  HENT:     Head: Normocephalic and atraumatic.  Eyes:     General: No scleral icterus.       Right eye: No discharge.  Left eye: No discharge.     Conjunctiva/sclera: Conjunctivae normal.  Cardiovascular:     Rate and Rhythm: Normal rate and regular rhythm.  Pulmonary:     Effort: Pulmonary effort is normal. No respiratory distress.  Musculoskeletal:     Cervical back: Normal range of motion.     Comments: RLE No traumatic wounds, ecchymosis, or rash  Mod TTP hip  No knee or ankle effusion  Knee stable to varus/ valgus and anterior/posterior stress  Sens DPN, SPN, TN intact  Motor EHL, ext, flex, evers 5/5  DP 2+, PT 2+, No significant edema  Skin:    General: Skin is warm and dry.  Neurological:     Mental Status: She is alert.  Psychiatric:        Mood and Affect: Mood normal.        Behavior: Behavior normal.     Assessment/Plan: Right hip fx -- Plan hip hemi today with Dr. [name]. Please keep NPO. Multiple medical problems including DM1, HTN, HLD, aortic stenosis, s/p TAVR, seizures, of Recurrent falls, multiple bone fractures in the past, hemorrhagic stroke in 2020, progressive weakness, and generalized cachexia -- per primary service    Ozell DOROTHA Ned, PA-C Orthopedic Surgery [phone] 11/24/2024, 9:43 AM     [1]  Allergies Allergen Reactions   Augmentin [Amoxicillin-Pot Clavulanate] Anaphylaxis   Ceclor [Cefaclor] Anaphylaxis    Tolerated cephalexin in 2018   Firvanq  [Vancomycin ] Anaphylaxis   Sumycin [Tetracycline] Anaphylaxis   Accupril  [Quinapril  Hcl] Other (See Comments)    Hyperkalemia   Bactrim  [Sulfamethoxazole -Trimethoprim ] Other (See Comments)    CKD - decreased kidney function   Fosamax [Alendronate Sodium] Other (See Comments)    Unknown reaction   Nsaids Other (See Comments)    CKD   Deltasone [Prednisone] Other (See Comments)     Unknown reaction

## 2024-11-24 NOTE — Consult Note (Signed)
 Reason for Consult:Right hip fx Referring Physician: Mennie Lamy Time called: 0730 Time at bedside: 0925   Danielle Clarke is an 83 y.o. female.  HPI: Donn fell at home over the weekend. She had immediate right hip pain and could not get up. She was brought to the ED where x-rays showed a right hip fx and orthopedic surgery was consulted. She lives at home with daughter and uses a RW to ambulate.  Past Medical History:  Diagnosis Date   Aortic stenosis, mild    Arthritis    Asthmatic bronchitis    with colds per patient   Carotid artery disease    40-59% bilateral ICA stenosis   Cataract    Cutaneous abscess of right foot    Glaucoma    Heart murmur    Dr Claudene is her  cardiologist.    Hyperlipemia    Hypertension    Dr. 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 Wendel and Dr. Maryjane   Syncope    Type 1 diabetes mellitus [facility])     Past Surgical History:  Procedure Laterality Date   ANKLE FRACTURE SURGERY Left 2001   steel plate and 3 screws    ANTERIOR CERVICAL CORPECTOMY N/A 07/31/2014   Procedure: Cervical Four to Cervical Six Corpectomy;  Surgeon: Catalina CHRISTELLA Stains, MD;  Location: [facility] NEURO ORS;  Service: Neurosurgery;  Laterality: N/A;  C4 to C6 Corpectomy   BREAST SURGERY  1988   CATARACT EXTRACTION W/ INTRAOCULAR LENS  IMPLANT, BILATERAL Bilateral 2011   right and then left   EYE SURGERY     FRACTURE SURGERY     HAMMER TOE SURGERY  1998   I & D EXTREMITY Right 09/27/2018   Procedure: IRRIGATION AND DEBRIDEMENT RIGHT FOOT;  Surgeon: Harden Jerona GAILS, MD;  Location: [facility] OR;  Service: Orthopedics;  Laterality: Right;   INTRAOPERATIVE TRANSTHORACIC ECHOCARDIOGRAM N/A 02/06/2023   Procedure: INTRAOPERATIVE TRANSTHORACIC ECHOCARDIOGRAM;  Surgeon: Wendel Lurena POUR, MD;  Location: [facility];  Service: Open Heart Surgery;  Laterality: N/A;    JOINT REPLACEMENT     PACEMAKER IMPLANT N/A 02/10/2019   Procedure: PACEMAKER IMPLANT;  Surgeon: Waddell Danelle ORN, MD;  Location: [facility] INVASIVE CV LAB;  Service: Cardiovascular;  Laterality: N/A;   RIGHT/LEFT HEART CATH AND CORONARY ANGIOGRAPHY N/A 01/11/2023   Procedure: RIGHT/LEFT HEART CATH AND CORONARY ANGIOGRAPHY;  Surgeon: Wendel Lurena POUR, MD;  Location: [facility] INVASIVE CV LAB;  Service: Cardiovascular;  Laterality: N/A;   TONSILLECTOMY AND ADENOIDECTOMY  1948   TOTAL SHOULDER REPLACEMENT  2010   right shoulder    TRANSCATHETER AORTIC VALVE REPLACEMENT, TRANSFEMORAL N/A 02/06/2023   Procedure: Transcatheter Aortic Valve Replacement, Transfemoral;  Surgeon: Thukkani, Arun K, MD;  Location: [facility] OR;  Service: Open Heart Surgery;  Laterality: N/A;   TUBAL LIGATION  1979   TYMPANOSTOMY TUBE PLACEMENT Bilateral     Family History  Problem Relation Age of Onset   Cancer Mother    Heart Problems Father     Social History:  reports that she has never smoked. She has never used smokeless tobacco. She reports current alcohol use of about 2.0 standard drinks of alcohol per week. She reports that she does not use drugs.  Allergies: Allergies[1]  Medications: I have reviewed the patient's current medications.  Results for orders placed or performed  during the hospital encounter of 11/23/24 (from the past 48 hours)  Basic metabolic panel     Status: Abnormal   Collection Time: 11/23/24  8:22 AM  Result Value Ref Range   Sodium 135 135 - 145 mmol/L   Potassium 4.3 3.5 - 5.1 mmol/L   Chloride 97 (L) 98 - 111 mmol/L   CO2 27 22 - 32 mmol/L   Glucose, Bld 203 (H) 70 - 99 mg/dL    Comment: Glucose reference range applies only to samples taken after fasting for at least 8 hours.   BUN 17 8 - 23 mg/dL   Creatinine, Ser 9.28 0.44 - 1.00 mg/dL   Calcium 9.0 8.9 - 89.6 mg/dL   GFR, Estimated >39 >39 mL/min    Comment: (NOTE) Calculated using the CKD-EPI Creatinine Equation (2021)    Anion gap 11 5 - 15     Comment: Performed at [facility], [address] [place], [place] [address]  CBC with Differential     Status: Abnormal   Collection Time: 11/23/24  8:22 AM  Result Value Ref Range   WBC 13.9 (H) 4.0 - 10.5 K/uL   RBC 4.74 3.87 - 5.11 MIL/uL   Hemoglobin 13.2 12.0 - 15.0 g/dL   HCT 59.5 63.9 - 53.9 %   MCV 85.2 80.0 - 100.0 fL   MCH 27.8 26.0 - 34.0 pg   MCHC 32.7 30.0 - 36.0 g/dL   RDW 85.0 88.4 - 84.4 %   Platelets 212 150 - 400 K/uL    Comment: SPECIMEN CHECKED FOR CLOTS   nRBC 0.0 0.0 - 0.2 %   Neutrophils Relative % 84 %   Neutro Abs 11.9 (H) 1.7 - 7.7 K/uL   Lymphocytes Relative 8 %   Lymphs Abs 1.1 0.7 - 4.0 K/uL   Monocytes Relative 5 %   Monocytes Absolute 0.7 0.1 - 1.0 K/uL   Eosinophils Relative 1 %   Eosinophils Absolute 0.1 0.0 - 0.5 K/uL   Basophils Relative 1 %   Basophils Absolute 0.1 0.0 - 0.1 K/uL   Immature Granulocytes 1 %   Abs Immature Granulocytes 0.08 (H) 0.00 - 0.07 K/uL    Comment: Performed at [facility], [address], [place], [place] [address]  Protime-INR     Status: None   Collection Time: 11/23/24  8:22 AM  Result Value Ref Range   Prothrombin Time 13.1 11.4 - 15.2 seconds   INR 0.9 0.8 - 1.2    Comment: (NOTE) INR goal varies based on device and disease states. Performed at [facility], [address], [place], [place] [address]   Magnesium      Status: None   Collection Time: 11/23/24  8:22 AM  Result Value Ref Range   Magnesium  2.0 1.7 - 2.4 mg/dL    Comment: Performed at [facility], [address], [place], [place] [address]  Phosphorus     Status: None   Collection Time: 11/23/24  8:22 AM  Result Value Ref Range   Phosphorus 3.1 2.5 - 4.6 mg/dL    Comment: Performed at [facility], [address], [place], [place] [address]  Hemoglobin A1c     Status: Abnormal   Collection Time: 11/23/24  8:22 AM  Result Value Ref Range   Hgb A1c MFr Bld 9.9 (H) 4.8 - 5.6 %    Comment:  (NOTE) Diagnosis of Diabetes The following HbA1c ranges recommended by the American Diabetes Association (  ADA) may be used as an aid in the diagnosis of diabetes mellitus.  Hemoglobin             Suggested A1C NGSP%              Diagnosis  <5.7                   Non Diabetic  5.7-6.4                Pre-Diabetic  >6.4                   Diabetic  <7.0                   Glycemic control for                       adults with diabetes.     Mean Plasma Glucose 237.43 mg/dL    Comment: Performed at [facility], [address], [place], [place] [address]  Type and screen [facility]     Status: None   Collection Time: 11/23/24  8:45 AM  Result Value Ref Range   ABO/RH(D) A POS    Antibody Screen NEG    Sample Expiration      11/26/2024,2359 Performed at [facility], [address] [place], [place] [address]   CBG monitoring, ED     Status: Abnormal   Collection Time: 11/23/24 12:44 PM  Result Value Ref Range   Glucose-Capillary 239 (H) 70 - 99 mg/dL    Comment: Glucose reference range applies only to samples taken after fasting for at least 8 hours.  Glucose, capillary     Status: Abnormal   Collection Time: 11/23/24  5:23 PM  Result Value Ref Range   Glucose-Capillary 251 (H) 70 - 99 mg/dL    Comment: Glucose reference range applies only to samples taken after fasting for at least 8 hours.  Protime-INR     Status: None   Collection Time: 11/24/24  4:09 AM  Result Value Ref Range   Prothrombin Time 14.5 11.4 - 15.2 seconds   INR 1.1 0.8 - 1.2    Comment: (NOTE) INR goal varies based on device and disease states. Performed at [facility], [address], [place], [place] [address]   APTT     Status: None   Collection Time: 11/24/24  4:09 AM  Result Value Ref Range   aPTT 34 24 - 36 seconds    Comment: Performed at [facility], [address], [place], [place] [address]  CBC     Status: Abnormal   Collection Time: 11/24/24   4:09 AM  Result Value Ref Range   WBC 16.5 (H) 4.0 - 10.5 K/uL   RBC 3.98 3.87 - 5.11 MIL/uL   Hemoglobin 11.1 (L) 12.0 - 15.0 g/dL   HCT 65.4 (L) 63.9 - 53.9 %   MCV 86.7 80.0 - 100.0 fL   MCH 27.9 26.0 - 34.0 pg   MCHC 32.2 30.0 - 36.0 g/dL   RDW 84.8 88.4 - 84.4 %   Platelets 229 150 - 400 K/uL   nRBC 0.0 0.0 - 0.2 %    Comment: Performed at [facility], [address] [place], [place] [address]  Basic metabolic panel     Status: Abnormal   Collection Time: 11/24/24  4:09 AM  Result Value Ref Range  Sodium 136 135 - 145 mmol/L   Potassium 4.4 3.5 - 5.1 mmol/L   Chloride 99 98 - 111 mmol/L   CO2 20 (L) 22 - 32 mmol/L   Glucose, Bld 322 (H) 70 - 99 mg/dL    Comment: Glucose reference range applies only to samples taken after fasting for at least 8 hours.   BUN 23 8 - 23 mg/dL   Creatinine, Ser 9.23 0.44 - 1.00 mg/dL   Calcium 8.4 (L) 8.9 - 10.3 mg/dL   GFR, Estimated >39 >39 mL/min    Comment: (NOTE) Calculated using the CKD-EPI Creatinine Equation (2021)    Anion gap 17 (H) 5 - 15    Comment: Performed at [facility], [address], [place], [place] [address]  Glucose, capillary     Status: Abnormal   Collection Time: 11/24/24  6:43 AM  Result Value Ref Range   Glucose-Capillary 321 (H) 70 - 99 mg/dL    Comment: Glucose reference range applies only to samples taken after fasting for at least 8 hours.  Surgical PCR screen     Status: None   Collection Time: 11/24/24  7:31 AM   Specimen: Nasal Mucosa; Nasal Swab  Result Value Ref Range   MRSA, PCR NEGATIVE NEGATIVE   Staphylococcus aureus NEGATIVE NEGATIVE    Comment: (NOTE) The Xpert SA Assay (FDA approved for NASAL specimens in patients 75 years of age and older), is one component of a comprehensive surveillance program. It is not intended to diagnose infection nor to guide or monitor treatment. Performed at [facility], [address] [place], [place] [address]   Glucose, capillary      Status: Abnormal   Collection Time: 11/24/24  8:12 AM  Result Value Ref Range   Glucose-Capillary 285 (H) 70 - 99 mg/dL    Comment: Glucose reference range applies only to samples taken after fasting for at least 8 hours.    CT Head Wo Contrast Result Date: 11/23/2024 EXAM: CT HEAD WITHOUT CONTRAST 11/23/2024 09:30:15 AM TECHNIQUE: CT of the head was performed without the administration of intravenous contrast. Automated exposure control, iterative reconstruction, and/or weight based adjustment of the mA/kV was utilized to reduce the radiation dose to as low as reasonably achievable. COMPARISON: CT Head 04/15/2024, Brain MRI 04/16/2024, CT cervical spine reported separately today. CLINICAL HISTORY: 83 year old female with minor head trauma after a fall at 8:15 PM. FINDINGS: BRAIN AND VENTRICLES: No acute hemorrhage. No evidence of acute infarct. No hydrocephalus. No extra-axial collection. No mass effect or midline shift. Pronounced chronic anterior left frontal lobe and anterior bitemporal encephalomalacia is stable. Stable brain volume. Stable ex vacuo ventricular enlargement. Stable gray white differentiation. Chronic dural calcification incidentally noted. No suspicious intracranial vascular hyperdensity. Advanced calcified atherosclerosis at the skull base. ORBITS: No acute abnormality. SINUSES: Chronic paranasal sinusitis with areas of advanced mucoperiosteal thickening is stable. SOFT TISSUES AND SKULL: No acute soft tissue abnormality. No skull fracture. Tympanic cavities and mastoids remain well aerated. IMPRESSION: 1. No acute traumatic injury identified. 2. Stable chronic left frontal and bitemporal encephalomalacia. 3. Stable chronic paranasal sinus disease. Electronically signed by: Helayne Hurst MD 11/23/2024 10:00 AM EST RP Workstation: [id number]   CT Cervical Spine Wo Contrast Result Date: 11/23/2024 EXAM: CT CERVICAL SPINE WITHOUT CONTRAST 11/23/2024 09:30:15 AM TECHNIQUE: CT of the  cervical spine was performed without the administration of intravenous contrast. Multiplanar reformatted images are provided for review. Automated exposure control, iterative reconstruction, and/or weight-based adjustment of the mA/kV  was utilized to reduce the radiation dose to as low as reasonably achievable. COMPARISON: CT head reported separately today, previous cervical spine CT on 02/06/2019, and CTA head and neck on 04/15/2024. CLINICAL HISTORY: 83 year old female, fell at 8:15 PM. 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 No significant change in mild distraction and displacement since 2020, and the chronic odontoid fragment does appear solidly fused with the anterior C1 ring. 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 However, chronic anterior cervical fusion hardware spanning from C3 to C7 now demonstrates severe loosening and bone resorption at the C7 vertebral body level (series 6, image 44) and C6-C7 pseudarthrosis. Developing degenerative bilateral C6-C7 facet ankylosis, which is new from 2020. Partially visible upper thoracic spinal hyperostosis and interbody ankylosis also. C3 cortical screw loosening is less pronounced but also substantially progressed since 2020 (series 2, image 40). No superimposed acute osseous abnormality identified in the cervical spine. DEGENERATIVE CHANGES: No strong CT evidence of cervical spinal stenosis. SOFT TISSUES: Prevertebral soft tissue thickening at the cervicothoracic junction appears chronic but has progressed (series 8, image 67), but no C7 or T1 endplate erosion is associated. Stable lung apex ventilation. Partially visible left chest cardiac pacemaker device. Calcified cervical carotid artery  atherosclerosis. IMPRESSION: 1. Since May 2025 substantial hardware loosening and bone resorption at C7-T1 , also C3. Associated nonspecific prevertebral soft tissue swelling there. But but no strong CT evidence of discitis osteomyelitis. Associated C6-C7 pseudarthrosis, superimposed on otherwise solid C2 through C6 spinal fusion. 2. No superimposed acute traumatic injury identified in the cervical spine. 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 Electronically signed by: Helayne Hurst MD 11/23/2024 09:58 AM EST RP Workstation: [id number]   DG Hip Unilat With Pelvis 2-3 Views Right Result Date: 11/23/2024 CLINICAL DATA:  Fall.  Right hip pain. EXAM: DG HIP (WITH OR WITHOUT PELVIS) 2-3V RIGHT COMPARISON:  None Available. FINDINGS: Acute, mildly displaced right femoral neck fracture is seen. No evidence of hip dislocation. No pelvic fracture identified. Mild pubic symphysis degenerative changes are seen. Peripheral vascular calcification also noted. IMPRESSION: Acute, mildly displaced right femoral neck fracture. Electronically Signed   By: [name] DELENA Kil M.D.   On: 11/23/2024 09:43    Review of Systems  HENT:  Negative for ear discharge, ear pain, hearing loss and tinnitus.   Eyes:  Negative for photophobia and pain.  Respiratory:  Negative for cough and shortness of breath.   Cardiovascular:  Negative for chest pain.  Gastrointestinal:  Negative for abdominal pain, nausea and vomiting.  Genitourinary:  Negative for dysuria, flank pain, frequency and urgency.  Musculoskeletal:  Positive for arthralgias (Right hip). Negative for back pain, myalgias and neck pain.  Neurological:  Negative for dizziness and headaches.  Hematological:  Does not bruise/bleed easily.  Psychiatric/Behavioral:  The patient is not nervous/anxious.    Blood pressure (!) 109/45, pulse 94, temperature 98.8 F (37.1 C), temperature source Oral, resp. rate 16, SpO2 98%. Physical Exam Constitutional:      General:  She is not in acute distress.    Appearance: She is well-developed. She is not diaphoretic.  HENT:     Head: Normocephalic and atraumatic.  Eyes:     General: No scleral icterus.       Right eye: No discharge.  Left eye: No discharge.     Conjunctiva/sclera: Conjunctivae normal.  Cardiovascular:     Rate and Rhythm: Normal rate and regular rhythm.  Pulmonary:     Effort: Pulmonary effort is normal. No respiratory distress.  Musculoskeletal:     Cervical back: Normal range of motion.     Comments: RLE No traumatic wounds, ecchymosis, or rash  Mod TTP hip  No knee or ankle effusion  Knee stable to varus/ valgus and anterior/posterior stress  Sens DPN, SPN, TN intact  Motor EHL, ext, flex, evers 5/5  DP 2+, PT 2+, No significant edema  Skin:    General: Skin is warm and dry.  Neurological:     Mental Status: She is alert.  Psychiatric:        Mood and Affect: Mood normal.        Behavior: Behavior normal.     Assessment/Plan: Right hip fx -- Plan hip hemi today with Dr. [name]. Please keep NPO. Multiple medical problems including DM1, HTN, HLD, aortic stenosis, s/p TAVR, seizures, of Recurrent falls, multiple bone fractures in the past, hemorrhagic stroke in 2020, progressive weakness, and generalized cachexia -- per primary service    Ozell DOROTHA Ned, PA-C Orthopedic Surgery [phone] 11/24/2024, 9:43 AM     [1]  Allergies Allergen Reactions   Augmentin [Amoxicillin-Pot Clavulanate] Anaphylaxis   Ceclor [Cefaclor] Anaphylaxis    Tolerated cephalexin in 2018   Firvanq  [Vancomycin ] Anaphylaxis   Sumycin [Tetracycline] Anaphylaxis   Accupril  [Quinapril  Hcl] Other (See Comments)    Hyperkalemia   Bactrim  [Sulfamethoxazole -Trimethoprim ] Other (See Comments)    CKD - decreased kidney function   Fosamax [Alendronate Sodium] Other (See Comments)    Unknown reaction   Nsaids Other (See Comments)    CKD   Deltasone [Prednisone] Other (See Comments)     Unknown reaction

## 2024-11-24 NOTE — Progress Notes (Signed)
 PT Cancellation Note  Patient Details Name: Danielle Clarke MRN: [id number] DOB: 22-Oct-1941   Cancelled Treatment:    Reason Eval/Treat Not Completed: Patient not medically ready (plan for OR today)  Danielle Clarke, PT, DPT Acute Rehabilitation Services Office [phone]    Danielle Clarke 11/24/2024, 8:00 AM

## 2024-11-24 NOTE — Interval H&P Note (Signed)
 History and Physical Interval Note:  11/24/2024 2:49 PM  Danielle Clarke  has presented today for surgery, with the diagnosis of Right femoral neck fracture.  The various methods of treatment have been discussed with the patient and family. After consideration of risks, benefits and other options for treatment, the patient has consented to  Procedures: HEMIARTHROPLASTY, HIP, DIRECT ANTERIOR APPROACH, FOR FRACTURE (Right) as a surgical intervention.  The patient's history has been reviewed, patient examined, no change in status, stable for surgery.  I have reviewed the patient's chart and labs.  Questions were answered to the patient's satisfaction.     Damian Hofstra P Melyssa Signor

## 2024-11-24 NOTE — Op Note (Signed)
 Orthopaedic Surgery Operative Note (CSN: [id number] ) Date of Surgery: 11/24/2024  Admit Date: 11/23/2024   Diagnoses: Pre-Op  Diagnoses: Right displaced femoral neck fracture Right elbow laceration/skin tear  Post-Op Diagnosis: Same  Procedures: CPT 27236-Right hip hemiarthroplasty for right femoral neck fracture CPT 12002-Repair of right elbow laceration/skin tear  Surgeons : Primary: [name] Franky SQUIBB, MD  Assistant: Lauraine Moores, PA-C  Location: OR 3   Anesthesia: General   Antibiotics: Levoquin IV preop   Tourniquet time: None    Estimated Blood Loss: 200 mL  Complications:* No complications entered in OR log *   Specimens:* No specimens in log *   Implants: Implant Name Type Inv. Item Serial No. Manufacturer Lot No. LRB No. Used Action  CABLE READY CERCLAGE W/CRIP - [id number] Trauma Fixation CABLE READY CERCLAGE W/CRIP  ZIMMER [employer]ON(ORTH,TRAU,BIO,SG) [id number] Right 1 Implanted  STEM FEM CMTLS STD 15X150 123D - [id number] Stem STEM FEM CMTLS STD 15X150 123D  [employer]IMMER [employer]ON(ORTH,TRAU,BIO,SG) [id number] Right 1 Implanted  SHELL RINGBLOC BI POLR 28X45MM - [id number] Orthopedic Implant SHELL RINGBLOC BI POLR 28X45MM  ZIMMER [employer]ON(ORTH,TRAU,BIO,SG) [id number] Right 1 Implanted  HEAD MOD COCR HD - NK - [id number] Orthopedic Implant HEAD MOD COCR HD - NK  ZIMMER [employer]ON(ORTH,TRAU,BIO,SG) [id number] Right 1 Implanted     Indications for Surgery: 83 year old female who sustained a ground-level fall with a right femoral neck fracture.  Due to the unstable nature of her injury I recommend proceeding with a right hip hemiarthroplasty.  She also had a skin tear to the right elbow which we recommended closure in the operating room.  Risks and benefits were discussed with the patient's daughter.  Risks include but not limited to bleeding, infection, hip dislocation, periprosthetic fracture, leg length discrepancy, nerve and blood vessel injury, DVT, even the possibility  anesthetic complications.  She agreed to proceed with surgery and consent was obtained.  Operative Findings: 1.  Right hip hemiarthroplasty through anterior approach using [employer] acetabular cup 45 mm outer diameter and 28 mm inner diameter 2.  [employer] modular head component with a -3 mm neck for a 28 mm inner diameter 3.  Cementless [employer] Taperloc reduced distal size 15 standard offset stem 4.  1.8 mm cerclage cable placed around the lesser to prevent propagation of a small calcar fracture  Procedure: The patient was identified in the preoperative holding area. Consent was confirmed with the patient and their family and all questions were answered. The operative extremity was marked after confirmation with the patient. she was then brought back to the operating room by our anesthesia colleagues.  She was placed under general anesthetic and carefully transferred over to the [facility] table.  Fluoroscopic imaging of the right hip was obtained to show the unstable nature of her injury.  The right hip was then prepped and draped in usual sterile fashion.  A timeout was performed to verify the patient, the procedure, and the extremity.  Preoperative antibiotics were dosed.  A standard anterior approach to the right hip was made and carried down through skin and subcutaneous tissue.  I incised through the TFL fascia and developed the interval between TFL and sartorius.  I exposed the anterior capsule and cauterized the crossing vessels.  I then performed a capsulotomy and performed a release of the capsule down to the lesser trochanter.  I then made a intercalary femoral neck cut.  I then used the corkscrew to remove the head.  I measured a 45 mm head size and trialed this  in the acetabulum.  I get good suction fit.  I then delivered the femur into the wound with external rotation.  I then performed a superior capsular release and proceeded to use a canal finder to enter the canal.  I then  sequentially broached from size 4 to a size 15 and got good rotational control.  An attempt was made to trial off of the stem but unfortunately while I was internally rotating the torque of of the stem against the acetabulum caused a small crack in the calcar I immediately stopped kept the implant in and proceeded to cable around the inferior portion of the lesser trochanter.  At this point I then used my trial implant to see if it was stable.  There was stability and rotational and impact control.  I then decided to place the 15 mm standard offset stem.  I then proceeded to place a 45 mm acetabular cup with a -3 neck.  The hip was reduced to final fluoroscopic imaging was obtained.  The incisions were irrigated the capsule was closed with #1 Ethibond.  The TFL fascia was closed with 0 Vicryl, skin was closed with 2-0 Monocryl and 3-0 Monocryl and Dermabond.  Sterile dressings were applied.  We then proceeded to cleanse the right elbow wound and irrigated and closed it with 3-0 nylon.  Sterile dressings were applied.  The patient was then awoke from anesthesia and taken to the PACU in stable condition.  Post Op Plan/Instructions: The patient will be weightbearing as tolerated to the right lower extremity.  She will receive postoperative Ancef .  She will be placed on aspirin  for DVT prophylaxis.  She will have no hip precautions.  We will have her mobilize with physical and Occupational Therapy.  I was present and performed the entire surgery.  Lauraine Moores, PA-C did assist me throughout the case. An assistant was necessary given the difficulty in approach, maintenance of reduction and ability to instrument the fracture.   Franky Light, MD [facility]

## 2024-11-24 NOTE — Anesthesia Procedure Notes (Signed)
 Procedure Name: Intubation Date/Time: 11/24/2024 3:42 PM  Performed by: Lockie Flesher, CRNAPre-anesthesia Checklist: Patient identified, Emergency Drugs available, Suction available and Patient being monitored Patient Re-evaluated:Patient Re-evaluated prior to induction Oxygen Delivery Method: Circle System Utilized Preoxygenation: Pre-oxygenation with 100% oxygen Induction Type: IV induction Ventilation: Mask ventilation without difficulty Laryngoscope Size: Mac and 3 Grade View: Grade II Tube type: Oral Tube size: 7.0 mm Number of attempts: 1 Airway Equipment and Method: Stylet and Oral airway Placement Confirmation: ETT inserted through vocal cords under direct vision, positive ETCO2 and breath sounds checked- equal and bilateral Secured at: 21 cm Tube secured with: Tape Dental Injury: Teeth and Oropharynx as per pre-operative assessment

## 2024-11-24 NOTE — Progress Notes (Signed)
 " PROGRESS NOTE Danielle Clarke  FMW:[id number] DOB: Dec 30, 1940 DOA: 11/23/2024 PCP: [name], MD  Brief Narrative/Hospital Course: Danielle Clarke is a 39 yof w/ extensive history DM1, HTN, HLD, aortic stenosis, s/p TAVR, seizures, recurrent falls, multiple bone fractures in the past, hemorrhagic stroke in 2020, progressive weakness, generalized cachexia, under hospice care at home who presented to the ED, post mechanical, accidental fall as she was trying to put on her pajamas. In ED: Blood pressure (!) 145/49, pulse 76, temperature 98.1 F (36.7 C), temperature source Oral, resp. rate 12, SpO2 92%. LABs: CBC BMP reviewed chloride 97, glucose 203, WBC 13.9, CT head/cervical spine, chronic odontoid fracture, hardware in place no acute fractures identified X-ray right hip: Acute, mildly displaced right femoral neck fracture.  Patient was admitted for further management  Subjective: Seen and examined today Difficulty and painful right extremity, daughter at the bedside Overnight BP 90s-100, on nasal cannula, afebrile,Labs stable BMP CBC with leukocytosis, cbg 320s.  Assessment and plan:  Closed right hip fracture 2/2 mechanical fall: CT head/cervical spine, chronic odontoid fracture, hardware in place no acute fractures identified X-ray right hip: Acute, mildly displaced right femoral neck fracture.  Ortho following planning operative intervention today, patient does have significant medical committees and risk factor w/ status post TAVR, history of heart block w/ PPM in place, hemorrhagic stroke in 2020,severe malnutrition, diabetes mellitus on insulin , ckd, limited mobility and currently under hospice at home but seems to be mobilizing with rolling walker. Patient's daughter and patient understands the risks and that their goal is to stabilize the joint and minimize pain and try to regain as much functional capacity as possible. Ortho planning for OR today,  Continue IV fluid  hydration, pain management  CKD stage II: Creatinine stable.  Monitor.   Essential hypertension, benign: BP soft hold metoprolol , losartan    Type 1 diabetes with uncontrolled hyperglycemia on insulin : Last A1c 9.7-7 months ago.  On Lantus  6 u bid-started 3 units daily while NPO, changed to q4 hr  ssi Recent Labs  Lab 11/23/24 0822 11/23/24 1244 11/23/24 1723 11/24/24 0643 11/24/24 0812  GLUCAP  --  239* 251* 321* 285*  HGBA1C 9.9*  --   --   --   --      Seizures disorder: Per patient and daughter questionable absence seizure episodes PTA on Keppra  continue the same   Protein-calorie malnutrition, severe Augment diet as able.     Compression fracture of T12 vertebra Chronic. With some gait abnormality, per daughter still ambulates with assistance.  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 Continuing aspirin , Toprol  AS ABLE   Anemia of chronic disease Monitor hemoglobin at discharge usually due to #1  Recent Labs  Lab 11/23/24 0822 11/24/24 0409  HGB 13.2 11.1*  HCT 40.4 34.5*    Dyslipidemia Continue statins    Bilateral carotid artery disease Continue statins, aspirin  once a day   Intracerebral hemorrhage: In 2020, no further complication, or debility, stable ambulates with cane CT head IN ED no acute intracranial abnormalities, chronic changes   Mobility: PT Orders: Active PT Follow up Rec:    DVT prophylaxis: Place and maintain sequential compression device Start: 11/23/24 1028 SCDs Start: 11/23/24 1026 Code Status:   Code Status: Full Code Family Communication: plan of care discussed with patient and daughter at bedside. Patient status is: Remains hospitalized because  of severity of illness Level of care: Telemetry   Dispo: The patient is from: Home with hospice            Anticipated disposition: TBD Objective: Vitals  last 24 hrs: Vitals:   11/23/24 2300 11/24/24 0500 11/24/24 0730 11/24/24 0905  BP: (!) 130/45 (!) 95/39 (!) 102/37 (!) 109/45  Pulse:  98 93 94  Resp:   16   Temp:  98.6 F (37 C) 98.8 F (37.1 C)   TempSrc:  Oral Oral   SpO2:  99% 98%     Physical Examination: General exam: alert awake, cachectic thin and frail  HEENT:Oral mucosa moist, Ear/Nose WNL grossly Respiratory system: Bilaterally clear BS,no use of accessory muscle Cardiovascular system: S1 & S2 +, No JVD. Gastrointestinal system: Abdomen soft,NT,ND, BS+ Nervous System: Alert, awake, moving all extremities,and following commands. Extremities: extremities warm, leg edema neg right hip externally rotated and shortened Skin: Warm, no rashes MSK: Normal muscle bulk,tone, power   Medications reviewed:  Scheduled Meds:  [START ON 11/25/2024] aspirin  EC  81 mg Oral Daily   insulin  aspart  0-9 Units Subcutaneous Q4H   insulin  glargine  3 Units Subcutaneous Daily   levETIRAcetam   250 mg Oral BID   metoprolol  tartrate  25 mg Oral BID   montelukast   10 mg Oral QHS   mupirocin  ointment  1 Application Nasal BID   senna-docusate  1 tablet Oral QHS   simvastatin   40 mg Oral QHS   sodium chloride  flush  3 mL Intravenous Q12H   Continuous Infusions:   Diet: Diet Order             Diet NPO time specified Except for: Sips with Meds, Ice Chips  Diet effective now                  Data Reviewed: I have personally reviewed following labs and imaging studies ( see epic result tab) CBC: Recent Labs  Lab 11/23/24 0822 11/24/24 0409  WBC 13.9* 16.5*  NEUTROABS 11.9*  --   HGB 13.2 11.1*  HCT 40.4 34.5*  MCV 85.2 86.7  PLT 212 229   CMP: Recent Labs  Lab 11/23/24 0822 11/24/24 0409  NA 135 136  K 4.3 4.4  CL 97* 99  CO2 27 20*  GLUCOSE 203* 322*  BUN 17 23  CREATININE 0.71 0.76  CALCIUM 9.0 8.4*  MG 2.0  --   PHOS 3.1  --    GFR: CrCl cannot be calculated (Unknown ideal weight.). No results for  input(s): AST, ALT, ALKPHOS, BILITOT, PROT, ALBUMIN in the last 168 hours. No results for input(s): LIPASE, AMYLASE in the last 168 hours. No results for input(s): AMMONIA in the last 168 hours. Coagulation Profile:  Recent Labs  Lab 11/23/24 0822 11/24/24 0409  INR 0.9 1.1   Unresulted Labs (From admission, onward)     Start     Ordered   11/24/24 0500  CBC  Daily,   R      11/23/24 1027   11/24/24 0500  Basic metabolic panel  Daily,   R      11/23/24 1027   11/23/24 1026  Expectorated Sputum Assessment w Gram Stain, Rflx to Resp Cult  Once,   R       Comments: If productive cough    11/23/24 1026           Antimicrobials/Microbiology: Anti-infectives (From admission, onward)    Start     Dose/Rate Route Frequency  Ordered Stop   11/25/24 0600  levofloxacin  (LEVAQUIN ) IVPB 500 mg        500 mg 100 mL/hr over 60 Minutes Intravenous On call to O.R. 11/24/24 1205 11/26/24 0559         Component Value Date/Time   SDES BLOOD SITE NOT SPECIFIED 04/01/2024 2315   SPECREQUEST  04/01/2024 2315    BOTTLES DRAWN AEROBIC AND ANAEROBIC Blood Culture results may not be optimal due to an inadequate volume of blood received in culture bottles   CULT  04/01/2024 2315    NO GROWTH 5 DAYS Performed at [facility], [address], [place], [place] [address]    REPTSTATUS 04/06/2024 FINAL 04/01/2024 2315    Procedures: Procedures (LRB): HEMIARTHROPLASTY, HIP, DIRECT ANTERIOR APPROACH, FOR FRACTURE (Right)   Mennie LAMY, MD [facility] 11/24/2024, 12:59 PM   "

## 2024-11-24 NOTE — Progress Notes (Signed)
 Pharmacy Antibiotic Note  Danielle Clarke is a 82 y.o. female admitted on 11/23/2024 with surgical prophylaxis.  Pharmacy has been consulted for Levaquin  dosing.  Closed hip fracture s/p mechanical fall and now s/p THA. Patient received Levaquin  500mg  at 1559 PM pre-operatively.   Plan: Patient is covered for >=24 hrs from pre-op  dose.  No further Levaquin  needed.  Please reconsult if needed.      Temp (24hrs), Avg:98.3 F (36.8 C), Min:97.4 F (36.3 C), Max:99.2 F (37.3 C)  Recent Labs  Lab 11/23/24 0822 11/24/24 0409  WBC 13.9* 16.5*  CREATININE 0.71 0.76    CrCl cannot be calculated (Unknown ideal weight.).    Allergies[1]  Harlene Boga, PharmD Please refer to [facility] for [facility] numbers 11/24/2024 8:38 PM     [1]  Allergies Allergen Reactions   Augmentin [Amoxicillin-Pot Clavulanate] Anaphylaxis   Ceclor [Cefaclor] Anaphylaxis    Tolerated cephalexin in 2018   Firvanq  [Vancomycin ] Anaphylaxis   Sumycin [Tetracycline] Anaphylaxis   Accupril  [Quinapril  Hcl] Other (See Comments)    Hyperkalemia   Bactrim  [Sulfamethoxazole -Trimethoprim ] Other (See Comments)    CKD - decreased kidney function   Fosamax [Alendronate Sodium] Other (See Comments)    Unknown reaction   Nsaids Other (See Comments)    CKD   Deltasone [Prednisone] Other (See Comments)    Unknown reaction

## 2024-11-24 NOTE — Plan of Care (Signed)

## 2024-11-24 NOTE — Anesthesia Preprocedure Evaluation (Addendum)
"                                    Anesthesia Evaluation  Patient identified by MRN, date of birth, ID band Patient awake and Patient confused    Reviewed: Allergy & Precautions, NPO status , Patient's Chart, lab work & pertinent test results  History of Anesthesia Complications Negative for: history of anesthetic complications  Airway Mallampati: II  TM Distance: >3 FB Neck ROM: Full    Dental  (+) Missing,    Pulmonary asthma    Pulmonary exam normal        Cardiovascular hypertension, Pt. on medications Normal cardiovascular exam+ dysrhythmias (3rd degree AVB) + pacemaker   S/P TAVR 2024  TTE 01/2024: EF 50-55%, grade I DD, mild pHTN (RVSP 42.47mmHg), mild MR, moderate TR, 20 mm Sapien prosthetic (TAVR) valve present in the aortic position. Echo findings are consistent with increased gradient of the aortic prosthesis. Aortic valve area, by VTI measures 1.33 cm. Aortic valve mean gradient measures 26.0 mmHg. Aortic valve Vmax measures 3.21 m/s    Neuro/Psych Seizures -, Well Controlled,  hemorrhagic stroke in 2020, progressive weakness, generalized cachexia, under hospice care at home    GI/Hepatic negative GI ROS, Neg liver ROS,,,  Endo/Other  diabetes, Type 1, Insulin  Dependent    Renal/GU Renal InsufficiencyRenal disease     Musculoskeletal  (+) Arthritis ,  Right femoral neck fracture   Abdominal   Peds  Hematology  (+) Blood dyscrasia (Hgb 11.1), anemia   Anesthesia Other Findings glaucoma  Reproductive/Obstetrics                              Anesthesia Physical Anesthesia Plan  ASA: 3  Anesthesia Plan: General   Post-op Pain Management: Tylenol  PO (pre-op )*   Induction: Intravenous  PONV Risk Score and Plan: Ondansetron  and Treatment may vary due to age or medical condition  Airway Management Planned: Oral ETT  Additional Equipment: None  Intra-op Plan:   Post-operative Plan: Extubation in  OR  Informed Consent: I have reviewed the patients History and Physical, chart, labs and discussed the procedure including the risks, benefits and alternatives for the proposed anesthesia with the patient or authorized representative who has indicated his/her understanding and acceptance.     Dental advisory given and Consent reviewed with POA  Plan Discussed with: CRNA  Anesthesia Plan Comments:          Anesthesia Quick Evaluation  "

## 2024-11-24 NOTE — Progress Notes (Signed)
" °  [facility] [phone] [facility] Hospitalized Patient Note   [name] is a current hospice patient followed at home for terminal diagnosis of CVD.  Patient experienced a fall last night.  Hospice nurse made a visit.  Patient with complaint of hip pain and sent to the ED for further evaluation.  Patient was admitted to [facility] on 12.28.25 with closed right hip fracture.  Per Dr. Eric, hospice physician, this is a related hospitalization.   Visited patient and daughter at bedside. Patient was being bathed/prepped for surgery and in a good deal of pain with movement. Plan for right hip hemiarthroplasty today.    Patient is appropriate for hospice GIP level of care requiring IV pain meds to manage current right hip fracture.   Vital Signs:  98.5/94/13   142/44    99% on 2lpm   Abnormal Labs: 11/24/24  CO2: 20 (L) Glucose: 322 (H) Calcium: 8.4 (L) Anion gap: 17 (H) WBC: 16.5 (H) Hemoglobin: 11.1 (L) HCT: 34.5 (L) IV/PRN Meds: Dilaudid  1mg  IV @ 1:12 p Morphine  4mg  IV x1, Zofran  4mg  IV x1, Dilaudid  1mg  IV x1, Dilaudid  0.5mg  IV x1   Diagnostics: none new  Assessment and Plan:  per Christobal Guadalajara, MD 12.29.25: Assessment and plan:   Closed right hip fracture 2/2 mechanical fall: CT head/cervical spine, chronic odontoid fracture, hardware in place no acute fractures identified X-ray right hip: Acute, mildly displaced right femoral neck fracture.  Ortho following planning operative intervention today, patient does have significant medical committees and risk factor w/ status post TAVR, history of heart block w/ PPM in place, hemorrhagic stroke in 2020,severe malnutrition, diabetes mellitus on insulin , ckd, limited mobility and currently under hospice at home but seems to be mobilizing with rolling walker. Patient's daughter and patient understands the risks and that their goal is to stabilize the joint and minimize pain and try to regain as much functional capacity as  possible. Ortho planning for OR today,  Continue IV fluid hydration, pain management  Discharge Plan- ongoing, home when medically stable IDT- updated Family Contact- spoke with daughter at bedside Goals of Care:  Full code  Hospice episode detail summary report sent for upload to patient's records.   Please call with any hospice related questions or concerns.   Danielle Nail, LPN [facility] Liaison [phone] "

## 2024-11-25 ENCOUNTER — Encounter (HOSPITAL_COMMUNITY): Payer: Self-pay | Admitting: Student

## 2024-11-25 DIAGNOSIS — S72001A Fracture of unspecified part of neck of right femur, initial encounter for closed fracture: Secondary | ICD-10-CM | POA: Diagnosis not present

## 2024-11-25 LAB — CBC
HCT: 28.8 % — ABNORMAL LOW (ref 36.0–46.0)
Hemoglobin: 9.4 g/dL — ABNORMAL LOW (ref 12.0–15.0)
MCH: 27.8 pg (ref 26.0–34.0)
MCHC: 32.6 g/dL (ref 30.0–36.0)
MCV: 85.2 fL (ref 80.0–100.0)
Platelets: 196 K/uL (ref 150–400)
RBC: 3.38 MIL/uL — ABNORMAL LOW (ref 3.87–5.11)
RDW: 15 % (ref 11.5–15.5)
WBC: 14.7 K/uL — ABNORMAL HIGH (ref 4.0–10.5)
nRBC: 0 % (ref 0.0–0.2)

## 2024-11-25 LAB — BASIC METABOLIC PANEL WITH GFR
Anion gap: 12 (ref 5–15)
BUN: 26 mg/dL — ABNORMAL HIGH (ref 8–23)
CO2: 23 mmol/L (ref 22–32)
Calcium: 8.3 mg/dL — ABNORMAL LOW (ref 8.9–10.3)
Chloride: 101 mmol/L (ref 98–111)
Creatinine, Ser: 0.69 mg/dL (ref 0.44–1.00)
GFR, Estimated: >60 mL/min
Glucose, Bld: 334 mg/dL — ABNORMAL HIGH (ref 70–99)
Potassium: 4.5 mmol/L (ref 3.5–5.1)
Sodium: 136 mmol/L (ref 135–145)

## 2024-11-25 LAB — GLUCOSE, CAPILLARY
Glucose-Capillary: 182 mg/dL — ABNORMAL HIGH (ref 70–99)
Glucose-Capillary: 212 mg/dL — ABNORMAL HIGH (ref 70–99)
Glucose-Capillary: 254 mg/dL — ABNORMAL HIGH (ref 70–99)
Glucose-Capillary: 266 mg/dL — ABNORMAL HIGH (ref 70–99)
Glucose-Capillary: 290 mg/dL — ABNORMAL HIGH (ref 70–99)
Glucose-Capillary: 91 mg/dL (ref 70–99)

## 2024-11-25 MED ORDER — INSULIN GLARGINE 100 UNIT/ML ~~LOC~~ SOLN
6.0000 [IU] | Freq: Two times a day (BID) | SUBCUTANEOUS | Status: DC
  Administered 2024-11-25: 6 [IU] via SUBCUTANEOUS
  Filled 2024-11-25 (×4): qty 0.06

## 2024-11-25 NOTE — Progress Notes (Signed)
"  °  [facility] [phone] [facility] Hospitalized Patient Note   [name] is a current hospice patient followed at home for terminal diagnosis of CVD.  Patient experienced a fall last night.  Hospice nurse made a visit.  Patient with complaint of hip pain and sent to the ED for further evaluation.  Patient was admitted to [facility] on 12.28.25 with closed right hip fracture.  Per Dr. Eric, hospice physician, this is a related hospitalization.   Visited with patient and her daughter Danielle Clarke at the bedside. Pt was alert and very pleasant, no complaints of pain. Spoke with Danielle Clarke about possible discharge plans. She said that her Mom will definitely want to come home but she may need to go to rehab first. Let her know that she will have to revoke her hospice Medicare benefit if she does go to rehab, but can resume services when she goes back home.    Patient is appropriate for hospice GIP level of care requiring IV pain meds to manage current right hip fracture.   Vital Signs:  98.3/82/16   143/49   94% on RA  Abnormal Labs: 11/25/24  Glucose: 334 (H) Calcium: 8.3 (L) WBC: 14.7(H) RBC: 3.38 (L)  Hemoglobin: 9.4 (L) HCT: 34.5 (L)  IV/PRN Meds: Hydralazine  10mg  IV push Q4H PRN Zofran  4mg  IV x 2 Dilaudid  0.5mg  IV x1   Diagnostics: none new   Assessment and Plan:  per Danielle Guadalajara, MD 12.30.25: Assessment and plan:   Closed right hip fracture 2/2 mechanical fall: CT head/cervical spine on admit no acute fracture. X-ray right hip: Acute, mildly displaced right femoral neck fracture.  S/p right hemiarthroplasty by Dr. [name] 12/29-WBAT on RLE, cont pain management,Bowel regimen: opiates/non opiates w/ laxatives per ortho DVT Prophylaxis:on asa 81 mg bid to continue upon discharge. Cont PTOT.  Anticipating SNF once bed available  Discharge Plan- ongoing, home when medically stable IDT- updated Family Contact- daughter Danielle Clarke Goals of Care:  Full code   Please call with any  hospice related questions or concerns.   Danielle Clarke, BSN, [facility] Liaison [phone]          "

## 2024-11-25 NOTE — Plan of Care (Signed)
  Problem: Clinical Measurements: Goal: Will remain free from infection Outcome: Progressing   Problem: Coping: Goal: Level of anxiety will decrease Outcome: Progressing   Problem: Safety: Goal: Ability to remain free from injury will improve Outcome: Progressing   

## 2024-11-25 NOTE — Evaluation (Signed)
 Occupational Therapy Evaluation Patient Details Name: Danielle Clarke MRN: [id number] DOB: 1941-02-14 Today's Date: 11/25/2024   History of Present Illness   83 y.o. female presents 11/23/24  after mechanical fall. X-ray right hip: Acute, mildly displaced right femoral neck fracture. S/p R hip hemiarthroplasty 12/29. PMH:  DM1, HTN, HLD, aortic stenosis, s/p TAVR, seizures, recurrent falls, multiple bone fractures in the past, hemorrhagic stroke in 2020, progressive weakness, generalized cachexia, under hospice care at home.     Clinical Impressions PTA Pt was utilizing RW for functional transfers and required light assistance for ADLs per chart review. Pt currently requiring up to Max A +2 for safety for mobility and up to total A for ADL engagement. Pt is primarily limited by R hip pain, unsteadiness on feet, generalized weakness, decreased activity tolerance, and decreased insight into deficits. OT to continue to follow Pt acutely to facilitate progress towards goals. Patient will benefit from continued inpatient follow up therapy, <3 hours/day with goal of safe return home.      If plan is discharge home, recommend the following:   A lot of help with walking and/or transfers;A lot of help with bathing/dressing/bathroom;Assistance with cooking/housework;Direct supervision/assist for medications management;Direct supervision/assist for financial management;Assist for transportation;Help with stairs or ramp for entrance     Functional Status Assessment   Patient has had a recent decline in their functional status and demonstrates the ability to make significant improvements in function in a reasonable and predictable amount of time.     Equipment Recommendations   Other (comment) (defer to next venue)     Recommendations for Other Services         Precautions/Restrictions   Precautions Precautions: Fall Recall of Precautions/Restrictions:  Impaired Restrictions Weight Bearing Restrictions Per Provider Order: Yes RLE Weight Bearing Per Provider Order: Weight bearing as tolerated     Mobility Bed Mobility Overal bed mobility: Needs Assistance Bed Mobility: Supine to Sit     Supine to sit: Max assist, +2 for physical assistance     General bed mobility comments: Helicopter method to transition to edge of bed with use of bed pad    Transfers Overall transfer level: Needs assistance Equipment used: 2 person hand held assist Transfers: Sit to/from Stand, Bed to chair/wheelchair/BSC Sit to Stand: Mod assist, +2 physical assistance Stand pivot transfers: Max assist, +2 safety/equipment         General transfer comment: Face to face transfer from bed > BSC and BSC > chair. Pt sustained partial stand with PT for OT to provide posterior peri care.      Balance Overall balance assessment: Needs assistance Sitting-balance support: Feet supported Sitting balance-Leahy Scale: Fair     Standing balance support: Bilateral upper extremity supported Standing balance-Leahy Scale: Poor Standing balance comment: Reliant on external support                           ADL either performed or assessed with clinical judgement   ADL Overall ADL's : Needs assistance/impaired Eating/Feeding: Set up   Grooming: Set up;Sitting   Upper Body Bathing: Minimal assistance   Lower Body Bathing: Maximal assistance   Upper Body Dressing : Set up   Lower Body Dressing: Maximal assistance   Toilet Transfer: Moderate assistance;+2 for physical assistance;+2 for safety/equipment;Squat-pivot;BSC/3in1   Toileting- Clothing Manipulation and Hygiene: Total assistance Toileting - Clothing Manipulation Details (indicate cue type and reason): total posterior peri care  Vision   Vision Assessment?: No apparent visual deficits     Perception         Praxis         Pertinent Vitals/Pain Pain  Assessment Pain Assessment: Faces Faces Pain Scale: Hurts even more Pain Location: R hip with mobility Pain Descriptors / Indicators: Operative site guarding, Grimacing, Moaning Pain Intervention(s): Limited activity within patient's tolerance, Monitored during session, Premedicated before session     Extremity/Trunk Assessment Upper Extremity Assessment Upper Extremity Assessment: Generalized weakness   Lower Extremity Assessment Lower Extremity Assessment: Defer to PT evaluation RLE Deficits / Details: R femoral neck fx s/p hemiarthroplasty. Grossly weak   Cervical / Trunk Assessment Cervical / Trunk Assessment: Kyphotic   Communication Communication Communication: No apparent difficulties   Cognition Arousal: Alert Behavior During Therapy: WFL for tasks assessed/performed Cognition: Cognition impaired   Orientation impairments: Situation Awareness: Online awareness impaired     Executive functioning impairment (select all impairments): Sequencing, Reasoning, Problem solving OT - Cognition Comments: Pt initially confused, stating that she was at home. Orientation to place improved throughout session, able to recall that she is at [facility]. Pt with difficulty problem solving and reasoning.                 Following commands: Impaired Following commands impaired: Follows one step commands with increased time     Cueing  General Comments   Cueing Techniques: Verbal cues  VSS on RA   Exercises     Shoulder Instructions      Home Living Family/patient expects to be discharged to:: Private residence Living Arrangements: Alone Available Help at Discharge: Family;Personal care attendant Type of Home: House Home Access: Stairs to enter [employer] of Steps: 2 Entrance Stairs-Rails: Left Home Layout: One level     Bathroom Shower/Tub: Chief Strategy Officer: Handicapped height     Home Equipment: Shower seat;Grab bars -  tub/shower;Rolling Environmental Consultant (2 wheels);Cane - single point;Wheelchair - manual;BSC/3in1   Additional Comments: daughter lives next door, is a financial controller so at home intermittently. Pt takes care of her cat. Daughter in law assists sometimes, PCA assists 4-5X as needed (info from prior admission)      Prior Functioning/Environment Prior Level of Function : Needs assist             Mobility Comments: using RW per chart review ADLs Comments: Ind with ADLs/selfcare, light meal prep, daughter assists with showers, used a cane but has been1-2 weeks since she could walk because of pain. Prior likes to go shopping with her aide (info from prior admission)    OT Problem List: Decreased strength;Decreased activity tolerance;Impaired balance (sitting and/or standing);Decreased safety awareness;Decreased knowledge of use of DME or AE;Pain   OT Treatment/Interventions: Self-care/ADL training;Therapeutic exercise;Energy conservation;DME and/or AE instruction;Therapeutic activities;Patient/family education;Balance training      OT Goals(Current goals can be found in the care plan section)   Acute Rehab OT Goals Patient Stated Goal: to get better OT Goal Formulation: With patient Time For Goal Achievement: 12/09/24 Potential to Achieve Goals: Good ADL Goals Pt Will Perform Lower Body Bathing: with supervision;with adaptive equipment Pt Will Perform Lower Body Dressing: with min assist;with adaptive equipment Pt Will Transfer to Toilet: with contact guard assist;stand pivot transfer;bedside commode Pt Will Perform Toileting - Clothing Manipulation and hygiene: with contact guard assist;sitting/lateral leans Additional ADL Goal #1: Pt will engage in bed mobility with Min A as a precursor to engagement in ADL tasks OOB.   OT Frequency:  Min 2X/week    Co-evaluation PT/OT/SLP Co-Evaluation/Treatment: Yes Reason for Co-Treatment: For patient/therapist safety;To address functional/ADL  transfers PT goals addressed during session: Mobility/safety with mobility OT goals addressed during session: ADL's and self-care;Proper use of Adaptive equipment and DME      AM-PAC OT 6 Clicks Daily Activity     Outcome Measure Help from another person eating meals?: A Little Help from another person taking care of personal grooming?: A Little Help from another person toileting, which includes using toliet, bedpan, or urinal?: Total Help from another person bathing (including washing, rinsing, drying)?: A Lot Help from another person to put on and taking off regular upper body clothing?: A Little Help from another person to put on and taking off regular lower body clothing?: A Lot 6 Click Score: 14   End of Session Equipment Utilized During Treatment: Gait belt Nurse Communication: Mobility status  Activity Tolerance: Patient tolerated treatment well Patient left: in chair;with call bell/phone within reach;with chair alarm set;with nursing/sitter in room  OT Visit Diagnosis: Unsteadiness on feet (R26.81);Muscle weakness (generalized) (M62.81);History of falling (Z91.81);Pain Pain - Right/Left: Right Pain - part of body: Hip                Time: 9184-9161 OT Time Calculation (min): 23 min Charges:  OT General Charges $OT Visit: 1 Visit OT Evaluation $OT Eval Low Complexity: 1 Low  Maurilio CROME, OTR/L.  [facility]  Office: [phone]   Maurilio PARAS Eileen Kangas 11/25/2024, 10:46 AM

## 2024-11-25 NOTE — Discharge Instructions (Signed)
 "   Danielle Light, MD Lauraine Moores PA-C [facility] [address] [place], [place] [address] [phone] (tel)   [phone] (fax)   TOTAL HIP REPLACEMENT POSTOPERATIVE DIRECTIONS    Hip Rehabilitation, Guidelines Following Surgery   WEIGHT BEARING Weight bearing as tolerated with assist device (walker, cane, etc) as directed, use it as long as suggested by your surgeon or therapist, typically at least 4-6 weeks.  The results of a hip operation are greatly improved after range of motion and muscle strengthening exercises. Follow all safety measures which are given to protect your hip. If any of these exercises cause increased pain or swelling in your joint, decrease the amount until you are comfortable again. Then slowly increase the exercises. Call your caregiver if you have problems or questions.   HOME CARE INSTRUCTIONS  Most of the following instructions are designed to prevent the dislocation of your new hip.  Remove items at home which could result in a fall. This includes throw rugs or furniture in walking pathways.  Continue medications as instructed at time of discharge. You may have some home medications which will be placed on hold until you complete the course of blood thinner medication. You may start showering once you are discharged home. Do not remove your dressing. Do not put on socks or shoes without following the instructions of your caregivers.   Sit on chairs with arms. Use the chair arms to help push yourself up when arising.  Arrange for the use of a toilet seat elevator so you are not sitting low.  Walk with walker as instructed.  You may resume a sexual relationship in one month or when given the OK by your caregiver.  Use walker as long as suggested by your caregivers.  You may put full weight on your legs and walk as much as is comfortable. Avoid periods of inactivity such as sitting longer than an hour when not asleep. This helps  prevent blood clots.  You may return to work once you are cleared by designer, industrial/product.  Do not drive a car for 6 weeks or until released by your surgeon.  Do not drive while taking narcotics.  Wear elastic stockings for two weeks following surgery during the day but you may remove then at night.  Make sure you keep all of your appointments after your operation with all of your doctors and caregivers. You should call the office at the above phone number and make an appointment for approximately two weeks after the date of your surgery. Please pick up a stool softener and laxative for home use as long as you are requiring pain medications. ICE to the affected hip every three hours for 30 minutes at a time and then as needed for pain and swelling. Continue to use ice on the hip for pain and swelling from surgery. You may notice swelling that will progress down to the foot and ankle.  This is normal after surgery.  Elevate the leg when you are not up walking on it.   It is important for you to complete the blood thinner medication as prescribed by your doctor. Continue to use the breathing machine which will help keep your temperature down.  It is common for your temperature to cycle up and down following surgery, especially at night when you are not up moving around and exerting yourself.  The breathing machine keeps your lungs expanded and your temperature down.  RANGE OF MOTION AND STRENGTHENING EXERCISES  These  exercises are designed to help you keep full movement of your hip joint. Follow your caregiver's or physical therapist's instructions. Perform all exercises about fifteen times, three times per day or as directed. Exercise both hips, even if you have had only one joint replacement. These exercises can be done on a training (exercise) mat, on the floor, on a table or on a bed. Use whatever works the best and is most comfortable for you. Use music or television while you are exercising so that the  exercises are a pleasant break in your day. This will make your life better with the exercises acting as a break in routine you can look forward to.  Lying on your back, slowly slide your foot toward your buttocks, raising your knee up off the floor. Then slowly slide your foot back down until your leg is straight again.  Lying on your back spread your legs as far apart as you can without causing discomfort.  Lying on your side, raise your upper leg and foot straight up from the floor as far as is comfortable. Slowly lower the leg and repeat.  Lying on your back, tighten up the muscle in the front of your thigh (quadriceps muscles). You can do this by keeping your leg straight and trying to raise your heel off the floor. This helps strengthen the largest muscle supporting your knee.  Lying on your back, tighten up the muscles of your buttocks both with the legs straight and with the knee bent at a comfortable angle while keeping your heel on the floor.   SKILLED REHAB INSTRUCTIONS: If the patient is transferred to a skilled rehab facility following release from the hospital, a list of the current medications will be sent to the facility for the patient to continue.  When discharged from the skilled rehab facility, please have the facility set up the patient's Home Health Physical Therapy prior to being released. Also, the skilled facility will be responsible for providing the patient with their medications at time of release from the facility to include their pain medication and their blood thinner medication. If the patient is still at the rehab facility at time of the two week follow up appointment, the skilled rehab facility will also need to assist the patient in arranging follow up appointment in our office and any transportation needs.  POST-OPERATIVE OPIOID TAPER INSTRUCTIONS: It is important to wean off of your opioid medication as soon as possible. If you do not need pain medication after your  surgery it is ok to stop day one. Opioids include: Codeine, Hydrocodone (Norco, Vicodin), Oxycodone (Percocet, oxycontin ) and hydromorphone  amongst others.  Long term and even short term use of opiods can cause: Increased pain response Dependence Constipation Depression Respiratory depression And more.  Withdrawal symptoms can include Flu like symptoms Nausea, vomiting And more Techniques to manage these symptoms Hydrate well Eat regular healthy meals Stay active Use relaxation techniques(deep breathing, meditating, yoga) Do Not substitute Alcohol to help with tapering If you have been on opioids for less than two weeks and do not have pain than it is ok to stop all together.  Plan to wean off of opioids This plan should start within one week post op of your joint replacement. Maintain the same interval or time between taking each dose and first decrease the dose.  Cut the total daily intake of opioids by one tablet each day Next start to increase the time between doses. The last dose that should be eliminated is the  evening dose.    MAKE SURE YOU:  Understand these instructions.  Will watch your condition.  Will get help right away if you are not doing well or get worse.  Do not remove your dressing. The dressing is waterproof--it is OK to take showers. Continue to use ice for pain and swelling after surgery. Do not use any lotions or creams on the incision until instructed by your surgeon. Total Hip Protocol. "

## 2024-11-25 NOTE — Anesthesia Postprocedure Evaluation (Signed)
"   Anesthesia Post Note  Patient: Danielle Clarke  Procedure(s) Performed: HEMIARTHROPLASTY, HIP, DIRECT ANTERIOR APPROACH, FOR FRACTURE (Right) IRRIGATION AND DEBRIDEMENT ELBOW WITH WOUND CLOSURE (Right: Elbow)     Patient location during evaluation: PACU Anesthesia Type: General Level of consciousness: awake and alert Pain management: pain level controlled Vital Signs Assessment: post-procedure vital signs reviewed and stable Respiratory status: spontaneous breathing, nonlabored ventilation, respiratory function stable and patient connected to nasal cannula oxygen Cardiovascular status: stable and blood pressure returned to baseline Anesthetic complications: no   No notable events documented.  Last Vitals:  Vitals:   11/25/24 0724 11/25/24 1439  BP: (!) 143/49 (!) 119/42  Pulse: 82 79  Resp: 16 16  Temp: 36.8 C 36.8 C  SpO2: 94% 93%                  Debby FORBES Like      "

## 2024-11-25 NOTE — Progress Notes (Signed)
 " PROGRESS NOTE Danielle Clarke  FMW:[id number] DOB: 1940/12/16 DOA: 11/23/2024 PCP: [name], MD  Brief Narrative/Hospital Course: Danielle Clarke is a 76 yof w/ extensive history DM1, HTN, HLD, aortic stenosis, s/p TAVR, seizures, recurrent falls, multiple bone fractures in the past, hemorrhagic stroke in 2020, progressive weakness, generalized cachexia, under hospice care at home who presented to the ED, post mechanical, accidental fall as she was trying to put on her pajamas. In ED: Blood pressure (!) 145/49, pulse 76, temperature 98.1 F (36.7 C), temperature source Oral, resp. rate 12, SpO2 92%. LABs: CBC BMP reviewed chloride 97, glucose 203, WBC 13.9, CT head/cervical spine, chronic odontoid fracture, hardware in place no acute fractures identified X-ray right hip: Acute, mildly displaced right femoral neck fracture.  Patient was admitted for further management   Subjective: Seen and examined today Fairly alert awake, on the bedside chair, having meal Overnight patient remains afebrile BP stable on room air Blood sugar remains poorly controlled hemoglobin slightly down to 9.4  Assessment and plan:  Closed right hip fracture 2/2 mechanical fall: CT head/cervical spine on admit no acute fracture. X-ray right hip: Acute, mildly displaced right femoral neck fracture.  S/p right hemiarthroplasty by Dr. [name] 12/29-WBAT on RLE, cont pain management,Bowel regimen: opiates/non opiates w/ laxatives per ortho DVT Prophylaxis:on asa 81 mg bid to continue upon discharge. Cont PTOT.  Anticipating SNF once bed available  CKD stage II: Creatinine stable.  Monitor.   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  On Lantus  6 u bid-started 3 units daily while NPO, changed  to q4 hr  ssi Recent Labs  Lab 11/23/24 0822 11/23/24 1244 11/24/24 1847 11/24/24 2019 11/25/24 0019 11/25/24 0358 11/25/24 0817  GLUCAP  --    < > 184* 200* 266* 290* 254*  HGBA1C 9.9*  --   --   --   --   --   --    < > = values in this interval not displayed.     Seizures disorder: Per patient and daughter questionable absence seizure episodes. Cont home Keppra    Protein-calorie malnutrition, severe Augment diet as able.     Compression fracture of T12 vertebra Chronic. With some gait abnormality, per daughter still ambulates with assistance. Ptot   Anemia of chronic disease ABLA due to fracture/surgery: Hemoglobin dropping but remains stable monitor and transfuse if less than 7 g Recent Labs  Lab 11/23/24 0822 11/24/24 0409 11/25/24 0505  HGB 13.2 11.1* 9.4*  HCT 40.4 34.5* 28.8*    Dyslipidemia Continue statins    Bilateral carotid artery disease Continue statins, aspirin    Intracerebral hemorrhage: In 2020, no further complication, or debility, stable ambulates with cane CT head IN ED no acute intracranial abnormalities, chronic changes   Mobility: PT Orders: Active PT Follow up Rec: Skilled Nursing-Short Term Rehab (<3 Hours/Day)11/25/2024 0840   DVT prophylaxis: SCDs Start: 11/24/24 1848 Place TED hose Start: 11/24/24 1848 Place and maintain sequential compression device Start: 11/23/24 1028 SCDs Start: 11/23/24 1026 Code Status:   Code Status: Full Code Family Communication: plan of care discussed with patient and daughter at bedside was updated 12/29. Patient status is: Remains hospitalized because of severity of illness Level of care: Telemetry   Dispo: The patient is from: Home with hospice  Anticipated disposition: Anticipating SNF, will need to revoke from hospice. Objective: Vitals last 24 hrs: Vitals:   11/25/24 0019 11/25/24 0358 11/25/24 0723 11/25/24 0724  BP: (!) 122/40 (!) 129/51 (!) 133/47 (!) 143/49  Pulse: 71 80 82 82   Resp: 18 16 16 16   Temp: 97.6 F (36.4 C) 98.5 F (36.9 C) 98.1 F (36.7 C) 98.3 F (36.8 C)  TempSrc: Oral Oral    SpO2: 96%  94% 94%   Physical Examination: General exam: alert awake, fairly oriented, cachectic HEENT:Oral mucosa moist, Ear/Nose WNL grossly Respiratory system: Bilaterally clear BS,no use of accessory muscle Cardiovascular system: S1 & S2 +, No JVD. Gastrointestinal system: Abdomen soft,NT,ND, BS+ Nervous System: Alert, awake, moving all extremities,and following commands. Extremities: extremities warm, leg edema neg right hip surgical site with Aquacel dressing + c/d/i Skin: Warm, no rashes MSK: Normal muscle bulk,tone, power   Medications reviewed:  Scheduled Meds:  aspirin  EC  81 mg Oral BID   docusate sodium   100 mg Oral BID   insulin  aspart  0-9 Units Subcutaneous Q4H   insulin  glargine  6 Units Subcutaneous BID   levETIRAcetam   250 mg Oral BID   metoprolol  tartrate  25 mg Oral BID   montelukast   10 mg Oral QHS   mupirocin  ointment  1 Application Nasal BID   senna-docusate  1 tablet Oral QHS   simvastatin   40 mg Oral QHS   sodium chloride  flush  3 mL Intravenous Q12H  Continuous Infusions: Diet: Diet Order             Diet Carb Modified Room service appropriate? Yes  Diet effective now                  Data Reviewed: I have personally reviewed following labs and imaging studies ( see epic result tab) CBC: Recent Labs  Lab 11/23/24 0822 11/24/24 0409 11/25/24 0505  WBC 13.9* 16.5* 14.7*  NEUTROABS 11.9*  --   --   HGB 13.2 11.1* 9.4*  HCT 40.4 34.5* 28.8*  MCV 85.2 86.7 85.2  PLT 212 229 196   CMP: Recent Labs  Lab 11/23/24 0822 11/24/24 0409 11/25/24 0505  NA 135 136 136  K 4.3 4.4 4.5  CL 97* 99 101  CO2 27 20* 23  GLUCOSE 203* 322* 334*  BUN 17 23 26*  CREATININE 0.71 0.76 0.69  CALCIUM 9.0 8.4* 8.3*  MG 2.0  --   --   PHOS 3.1  --   --    GFR: CrCl cannot be calculated (Unknown ideal weight.). No results for  input(s): AST, ALT, ALKPHOS, BILITOT, PROT, ALBUMIN in the last 168 hours. No results for input(s): LIPASE, AMYLASE in the last 168 hours. No results for input(s): AMMONIA in the last 168 hours. Coagulation Profile:  Recent Labs  Lab 11/23/24 0822 11/24/24 0409  INR 0.9 1.1   Unresulted Labs (From admission, onward)     Start     Ordered   11/25/24 0500  CBC  Daily,   R      11/24/24 1847   11/23/24 1026  Expectorated Sputum Assessment w Gram Stain, Rflx to Resp Cult  Once,   R       Comments: If productive cough    11/23/24 1026           Antimicrobials/Microbiology: Anti-infectives (From admission, onward)    Start     Dose/Rate Route Frequency Ordered Stop   11/25/24 0600  levofloxacin  (LEVAQUIN ) IVPB 500  mg        500 mg 100 mL/hr over 60 Minutes Intravenous On call to O.R. 11/24/24 1205 11/24/24 1559         Component Value Date/Time   SDES BLOOD SITE NOT SPECIFIED 04/01/2024 2315   SPECREQUEST  04/01/2024 2315    BOTTLES DRAWN AEROBIC AND ANAEROBIC Blood Culture results may not be optimal due to an inadequate volume of blood received in culture bottles   CULT  04/01/2024 2315    NO GROWTH 5 DAYS Performed at [facility], [address], [place], [place] [address]    REPTSTATUS 04/06/2024 FINAL 04/01/2024 2315    Procedures: Procedures (LRB): HEMIARTHROPLASTY, HIP, DIRECT ANTERIOR APPROACH, FOR FRACTURE (Right) IRRIGATION AND DEBRIDEMENT ELBOW WITH WOUND CLOSURE (Right)   Mennie LAMY, MD [facility] 11/25/2024, 11:08 AM   "

## 2024-11-25 NOTE — Inpatient Diabetes Management (Signed)
 Inpatient Diabetes Program Recommendations  AACE/ADA: New Consensus Statement on Inpatient Glycemic Control   Target Ranges:  Prepandial:   less than 140 mg/dL      Peak postprandial:   less than 180 mg/dL (1-2 hours)      Critically ill patients:  140 - 180 mg/dL   Lab Results  Component Value Date   GLUCAP 212 (H) 11/25/2024   HGBA1C 9.9 (H) 11/23/2024    Latest Reference Range & Units 11/24/24 18:47 11/24/24 20:19 11/25/24 00:19 11/25/24 03:58 11/25/24 08:17 11/25/24 11:24  Glucose-Capillary 70 - 99 mg/dL 815 (H) 799 (H) 733 (H) 290 (H) 254 (H) 212 (H)   Review of Glycemic Control  Diabetes history: DM1  Outpatient Diabetes medications:  Lantus  6 units BID  Humalog 0-12 units AC/HS   Current orders for Inpatient glycemic control: Lantus  6 units BID  Novolog  0-9 units Q4HRS   Inpatient Diabetes Program Recommendations:   Spoke with patient and patient's daughter, Danielle Clarke at bedside. Patient pleasantly confused, oriented to person. Daughter states d/t pt's memory loss, she assist patient with medications at home. Followed by Endo, Dr Tommas for diabetes management and currently taking Lantus  6 units BID and Humalog 0-12 units AC/HS. She has had Type 1 diabetes since she was 83 years old.  Reports checking glucose 3-4 times per day using gluco-meter. Discussed A1C results 9.9% and explained that current A1C indicates an average glucose of 237 mg/dl over the past 2-3 months. Discussed glucose and A1C goals. Daughter explains patient is very particular about her insulin  regimen and worries about hypoglycemia. I offered to assist with setting up CGM to help with the anxiety of hypoglycemia. Patient refused at this time and states she would rather stick her finger. Daughter states she is old-school and wants to keep doing everything the same. She has tried using [employer] CGM in the past but did not like wearing the sensor on her arm. I made them aware if pt changes her mind while  inpatient, diabetes coordinator can be contacted or CGM can be discussed with Dr Tommas outpatient. Patient and daughter were very appreciative of education. No further questions or concerns at this time.   Thanks,  Danielle Search, RN, MSN, [facility]  Inpatient Diabetes Coordinator  Pager [phone] (8a-5p)

## 2024-11-25 NOTE — Progress Notes (Addendum)
 Orthopaedic Trauma Progress Note  SUBJECTIVE: Doing okay this morning, currently waiting for her breakfast tray to be delivered.  Just finished physical therapy eval.  Noted pain in the right hip with mobility. Notes difficulty moving the right leg. We discussed that can be normal given her injury/surgery. Has not required any narcotics post op. Denies any numbness or tingling throughout the right lower extremity. No issues with the right elbow.  No chest pain. Has a chronic productive cough. No nausea/vomiting. No other complaints.  Prior to admission patient was ambulating with a walker.  She is currently under hospice care at home.  OBJECTIVE:  Vitals:   11/25/24 0723 11/25/24 0724  BP: (!) 133/47 (!) 143/49  Pulse: 82 82  Resp: 16 16  Temp: 98.1 F (36.7 C) 98.3 F (36.8 C)  SpO2: 94% 94%    Opiates Today (MME): Today's  total administered Morphine  Milligram Equivalents: 0 Opiates Yesterday (MME): Yesterday's total administered Morphine  Milligram Equivalents: 55  General: Patient sitting up in bedside chair.  No acute distress Respiratory: No increased work of breathing.  Operative Extremity (RLE): Aquacel dressing clean, dry, intact.  Tenderness about the hip as expected.  Less tender through the thigh or lower leg.  No significant calf tenderness.  Compartment soft compressible.  Tolerates gentle ankle range of motion.  Endorses sensation light touch over all aspects of the foot.  Able to wiggle the toes. + DP pulse RUE: Dressing clean, dry, intact.  Some soreness with palpation about the elbow.  No significant pain with motion.  Neurovascularly intact distally.  IMAGING: Stable post op imaging R hip  LABS:  Results for orders placed or performed during the hospital encounter of 11/23/24 (from the past 24 hours)  Glucose, capillary     Status: Abnormal   Collection Time: 11/24/24 11:41 AM  Result Value Ref Range   Glucose-Capillary 203 (H) 70 - 99 mg/dL  Glucose, capillary      Status: Abnormal   Collection Time: 11/24/24  1:51 PM  Result Value Ref Range   Glucose-Capillary 166 (H) 70 - 99 mg/dL   Comment 1 Notify RN    Comment 2 Document in Chart   Glucose, capillary     Status: Abnormal   Collection Time: 11/24/24  5:37 PM  Result Value Ref Range   Glucose-Capillary 163 (H) 70 - 99 mg/dL  Glucose, capillary     Status: Abnormal   Collection Time: 11/24/24  6:47 PM  Result Value Ref Range   Glucose-Capillary 184 (H) 70 - 99 mg/dL  Glucose, capillary     Status: Abnormal   Collection Time: 11/24/24  8:19 PM  Result Value Ref Range   Glucose-Capillary 200 (H) 70 - 99 mg/dL  Glucose, capillary     Status: Abnormal   Collection Time: 11/25/24 12:19 AM  Result Value Ref Range   Glucose-Capillary 266 (H) 70 - 99 mg/dL  Glucose, capillary     Status: Abnormal   Collection Time: 11/25/24  3:58 AM  Result Value Ref Range   Glucose-Capillary 290 (H) 70 - 99 mg/dL  CBC     Status: Abnormal   Collection Time: 11/25/24  5:05 AM  Result Value Ref Range   WBC 14.7 (H) 4.0 - 10.5 K/uL   RBC 3.38 (L) 3.87 - 5.11 MIL/uL   Hemoglobin 9.4 (L) 12.0 - 15.0 g/dL   HCT 71.1 (L) 63.9 - 53.9 %   MCV 85.2 80.0 - 100.0 fL   MCH 27.8 26.0 - 34.0  pg   MCHC 32.6 30.0 - 36.0 g/dL   RDW 84.9 88.4 - 84.4 %   Platelets 196 150 - 400 K/uL   nRBC 0.0 0.0 - 0.2 %  Basic metabolic panel     Status: Abnormal   Collection Time: 11/25/24  5:05 AM  Result Value Ref Range   Sodium 136 135 - 145 mmol/L   Potassium 4.5 3.5 - 5.1 mmol/L   Chloride 101 98 - 111 mmol/L   CO2 23 22 - 32 mmol/L   Glucose, Bld 334 (H) 70 - 99 mg/dL   BUN 26 (H) 8 - 23 mg/dL   Creatinine, Ser 9.30 0.44 - 1.00 mg/dL   Calcium 8.3 (L) 8.9 - 10.3 mg/dL   GFR, Estimated >39 >39 mL/min   Anion gap 12 5 - 15  Glucose, capillary     Status: Abnormal   Collection Time: 11/25/24  8:17 AM  Result Value Ref Range   Glucose-Capillary 254 (H) 70 - 99 mg/dL    ASSESSMENT: Danielle Clarke is a 83 y.o. female, 1  Day Post-Op s/p fall Procedures: RIGHT HIP HEMIARTHROPLASTY FOR FRACTURE, ANTERIOR APPROACH  IRRIGATION DEBRIDEMENT RIGHT ELBOW WITH WOUND CLOSURE  CV/Blood loss: Acute blood loss anemia, Hgb 9.4 this AM. Hemodynamically stable  PLAN: Weightbearing: WBAT RLE ROM: Unrestricted ROM Incisional and dressing care: Keep Aquacel dressing in place to right hip until follow-up.  Change RUE Mepilex dressing as needed Showering: Aquacel dressing may get wet.  Right elbow laceration okay to begin getting wet starting 11/27/2024  Orthopedic device(s): None  Pain management:  1. Tylenol  650 mg q 6 hours PRN 2. Robaxin  500 mg q 8 hours PRN 3. Oxycodone  2.5-5 mg q 4 hours PRN 4. Dilaudid  0.5 mg q 3 hours PRN VTE prophylaxis: Aspirin , SCDs ID: Perioperative ABX completed Foley/Lines:  No foley, KVO IVFs Dispo: PT/OT evaluation today, recommending SNF.  Patient would need hospice revoked prior to SNF placement.  Ortho issues stable.  Okay for discharge from ortho standpoint once cleared by medicine team and therapies  D/C recommendations: - home dose Tramadol , Robaxin , Tylenol  for pain control - Aspirin  81 mg for DVT prophylaxis   Follow - up plan: 2 weeks after d/c for wound check and repeat x-rays   Contact information:  Franky Light MD, Danielle Moores PA-C. After hours and holidays please check [email or web address] for group call information for [facility]   Danielle PATRIC Moores, PA-C [phone] (office) [email or web address]

## 2024-11-25 NOTE — Evaluation (Signed)
 Physical Therapy Evaluation Patient Details Name: Danielle Clarke MRN: [id number] DOB: 1941-04-19 Today's Date: 11/25/2024  History of Present Illness  83 y.o. female presents 11/23/24  after mechanical fall. X-ray right hip: Acute, mildly displaced right femoral neck fracture. S/p R hip hemiarthroplasty 12/29. PMH:  DM1, HTN, HLD, aortic stenosis, s/p TAVR, seizures, recurrent falls, multiple bone fractures in the past, hemorrhagic stroke in 2020, progressive weakness, generalized cachexia, under hospice care at home.  Clinical Impression  Pt admitted with R femoral neck fx s/p hemiarthroplasty. Pt presents with decreased functional mobility secondary to impaired cognition, balance, RLE weakness, pain. Upon entry, pt reporting need to have BM. Pt assisted with transfer to [facility], posterior peri care and then performed stand pivot to chair. Pt requiring mod-maxA (+2 safety) for bed mobility and transfers. Patient will benefit from continued inpatient follow up therapy, <3 hours/day to address deficits, maximize functional mobility and decrease caregiver burden.      If plan is discharge home, recommend the following: A lot of help with walking and/or transfers;A lot of help with bathing/dressing/bathroom   Can travel by private vehicle   No    Equipment Recommendations None recommended by PT  Recommendations for Other Services       Functional Status Assessment Patient has had a recent decline in their functional status and demonstrates the ability to make significant improvements in function in a reasonable and predictable amount of time.     Precautions / Restrictions Precautions Precautions: Fall Restrictions Weight Bearing Restrictions Per Provider Order: No      Mobility  Bed Mobility Overal bed mobility: Needs Assistance Bed Mobility: Supine to Sit     Supine to sit: Max assist, +2 for physical assistance     General bed mobility comments: Helicopter method to  transition to edge of bed with use of bed pad    Transfers Overall transfer level: Needs assistance Equipment used: 2 person hand held assist Transfers: Sit to/from Stand, Bed to chair/wheelchair/BSC Sit to Stand: Mod assist, +2 physical assistance Stand pivot transfers: Max assist, +2 safety/equipment         General transfer comment: Face to face transfer from bed > BSC and BSC > chair. Pt sustained partial stand for OT to provide posterior peri care.    Ambulation/Gait                  Stairs            Wheelchair Mobility     Tilt Bed    Modified Rankin (Stroke Patients Only)       Balance Overall balance assessment: Needs assistance Sitting-balance support: Feet supported Sitting balance-Leahy Scale: Fair     Standing balance support: Bilateral upper extremity supported Standing balance-Leahy Scale: Poor Standing balance comment: Reliant on external support                             Pertinent Vitals/Pain Pain Assessment Pain Assessment: Faces Faces Pain Scale: Hurts even more Pain Location: R hip with mobility Pain Descriptors / Indicators: Operative site guarding, Grimacing, Moaning Pain Intervention(s): Limited activity within patient's tolerance, Monitored during session, Premedicated before session    Home Living Family/patient expects to be discharged to:: Private residence Living Arrangements: Alone Available Help at Discharge: Family;Personal care attendant Type of Home: House Home Access: Stairs to enter Entrance Stairs-Rails: Left Entrance Stairs-Number of Steps: 2   Home Layout: One level Home Equipment: Shower seat;Grab  bars - tub/shower;Rolling Walker (2 wheels);Cane - single point;Wheelchair - manual;BSC/3in1 Additional Comments: daughter lives next door, is a financial controller so at home intermittently. Pt takes care of her cat. Daughter in law assists sometimes, PCA assists 4-5X as needed (info from prior  admission)    Prior Function Prior Level of Function : Needs assist             Mobility Comments: using RW per chart review ADLs Comments: Ind with ADLs/selfcare, light meal prep, daughter assists with showers, used a cane but has been1-2 weeks since she could walk because of pain. Prior likes to go shopping with her aide (info from prior admission)     Extremity/Trunk Assessment   Upper Extremity Assessment Upper Extremity Assessment: Defer to OT evaluation    Lower Extremity Assessment Lower Extremity Assessment: Generalized weakness;RLE deficits/detail RLE Deficits / Details: R femoral neck fx s/p hemiarthroplasty. Grossly weak    Cervical / Trunk Assessment Cervical / Trunk Assessment: Kyphotic  Communication   Communication Communication: No apparent difficulties    Cognition Arousal: Alert Behavior During Therapy: WFL for tasks assessed/performed   PT - Cognitive impairments: No family/caregiver present to determine baseline                       PT - Cognition Comments: Pt A&O to self and month. Initially reports she is in her bedroom, then re-oriented to that she was at [facility]. Pt reporting she had surgery on her L ankle. Following commands: Impaired Following commands impaired: Follows one step commands with increased time     Cueing Cueing Techniques: Verbal cues     General Comments      Exercises     Assessment/Plan    PT Assessment Patient needs continued PT services  PT Problem List Decreased strength;Decreased activity tolerance;Decreased balance;Decreased mobility;Decreased cognition;Decreased safety awareness;Pain       PT Treatment Interventions DME instruction;Gait training;Functional mobility training;Therapeutic activities;Therapeutic exercise;Balance training;Patient/family education    PT Goals (Current goals can be found in the Care Plan section)  Acute Rehab PT Goals Patient Stated Goal: to go home PT Goal  Formulation: With patient Time For Goal Achievement: 12/09/24 Potential to Achieve Goals: Fair    Frequency Min 2X/week     Co-evaluation PT/OT/SLP Co-Evaluation/Treatment: Yes Reason for Co-Treatment: For patient/therapist safety;To address functional/ADL transfers PT goals addressed during session: Mobility/safety with mobility         AM-PAC PT 6 Clicks Mobility  Outcome Measure Help needed turning from your back to your side while in a flat bed without using bedrails?: A Lot Help needed moving from lying on your back to sitting on the side of a flat bed without using bedrails?: A Lot Help needed moving to and from a bed to a chair (including a wheelchair)?: A Lot Help needed standing up from a chair using your arms (e.g., wheelchair or bedside chair)?: A Lot Help needed to walk in hospital room?: Total Help needed climbing 3-5 steps with a railing? : Total 6 Click Score: 10    End of Session Equipment Utilized During Treatment: Gait belt Activity Tolerance: Patient tolerated treatment well Patient left: in chair;with call bell/phone within reach;with chair alarm set Nurse Communication: Mobility status PT Visit Diagnosis: Pain;Unsteadiness on feet (R26.81);History of falling (Z91.81);Muscle weakness (generalized) (M62.81);Difficulty in walking, not elsewhere classified (R26.2) Pain - Right/Left: Right Pain - part of body: Hip    Time: 9185-9160 PT Time Calculation (min) (ACUTE ONLY): 25 min  Charges:   PT Evaluation $PT Eval Low Complexity: 1 Low   PT General Charges $$ ACUTE PT VISIT: 1 Visit         Aleck Daring, PT, DPT Acute Rehabilitation Services Office [phone]   [name] 11/25/2024, 8:45 AM

## 2024-11-26 DIAGNOSIS — S72001A Fracture of unspecified part of neck of right femur, initial encounter for closed fracture: Secondary | ICD-10-CM | POA: Diagnosis not present

## 2024-11-26 LAB — CBC
HCT: 30.2 % — ABNORMAL LOW (ref 36.0–46.0)
Hemoglobin: 9.7 g/dL — ABNORMAL LOW (ref 12.0–15.0)
MCH: 27.6 pg (ref 26.0–34.0)
MCHC: 32.1 g/dL (ref 30.0–36.0)
MCV: 86 fL (ref 80.0–100.0)
Platelets: 199 K/uL (ref 150–400)
RBC: 3.51 MIL/uL — ABNORMAL LOW (ref 3.87–5.11)
RDW: 15.3 % (ref 11.5–15.5)
WBC: 10.3 K/uL (ref 4.0–10.5)
nRBC: 0 % (ref 0.0–0.2)

## 2024-11-26 LAB — GLUCOSE, CAPILLARY
Glucose-Capillary: 135 mg/dL — ABNORMAL HIGH (ref 70–99)
Glucose-Capillary: 136 mg/dL — ABNORMAL HIGH (ref 70–99)
Glucose-Capillary: 192 mg/dL — ABNORMAL HIGH (ref 70–99)
Glucose-Capillary: 236 mg/dL — ABNORMAL HIGH (ref 70–99)
Glucose-Capillary: 270 mg/dL — ABNORMAL HIGH (ref 70–99)
Glucose-Capillary: 52 mg/dL — ABNORMAL LOW (ref 70–99)

## 2024-11-26 MED ORDER — GUAIFENESIN-DM 100-10 MG/5ML PO SYRP
5.0000 mL | ORAL_SOLUTION | ORAL | Status: DC | PRN
  Administered 2024-11-26 – 2024-11-27 (×2): 5 mL via ORAL
  Filled 2024-11-26 (×2): qty 10

## 2024-11-26 MED ORDER — ASPIRIN 81 MG PO TBEC: 81.0000 mg | DELAYED_RELEASE_TABLET | Freq: Two times a day (BID) | ORAL | Status: AC

## 2024-11-26 MED ORDER — INSULIN ASPART 100 UNIT/ML IJ SOLN
2.0000 [IU] | Freq: Once | INTRAMUSCULAR | Status: AC
  Administered 2024-11-26: 2 [IU] via SUBCUTANEOUS
  Filled 2024-11-26: qty 2

## 2024-11-26 MED ORDER — INSULIN GLARGINE 100 UNIT/ML ~~LOC~~ SOLN
4.0000 [IU] | Freq: Two times a day (BID) | SUBCUTANEOUS | Status: DC
  Administered 2024-11-26 – 2024-11-28 (×5): 4 [IU] via SUBCUTANEOUS
  Filled 2024-11-26 (×6): qty 0.04

## 2024-11-26 MED ORDER — INSULIN ASPART 100 UNIT/ML IJ SOLN
0.0000 [IU] | Freq: Three times a day (TID) | INTRAMUSCULAR | Status: DC
  Administered 2024-11-26 (×2): 2 [IU] via SUBCUTANEOUS
  Administered 2024-11-27: 3 [IU] via SUBCUTANEOUS
  Administered 2024-11-27: 1 [IU] via SUBCUTANEOUS
  Administered 2024-11-28: 4 [IU] via SUBCUTANEOUS
  Administered 2024-11-28: 1 [IU] via SUBCUTANEOUS
  Filled 2024-11-26: qty 4
  Filled 2024-11-26: qty 1
  Filled 2024-11-26: qty 2
  Filled 2024-11-26: qty 3
  Filled 2024-11-26: qty 2
  Filled 2024-11-26: qty 1

## 2024-11-26 MED ORDER — OXYCODONE HCL 5 MG PO TABS: 2.5000 mg | ORAL_TABLET | ORAL | 0 refills | Status: DC | PRN

## 2024-11-26 MED ORDER — METHOCARBAMOL 500 MG PO TABS: 500.0000 mg | ORAL_TABLET | Freq: Three times a day (TID) | ORAL | 0 refills | Status: DC | PRN

## 2024-11-26 NOTE — Progress Notes (Signed)
 " Danielle Clarke  FMW:[id number] DOB: 03-Apr-1941 DOA: 11/23/2024 PCP: Danielle Velna SAUNDERS, MD  Brief Narrative/Hospital Course: Danielle Clarke is a 16 yof w/ extensive history DM1, HTN, HLD, aortic stenosis, s/p TAVR, seizures, recurrent falls, multiple bone fractures in the past, hemorrhagic stroke in 2020, progressive weakness, generalized cachexia, under hospice care at home who presented to the ED, post mechanical, accidental fall as she was trying to put on her pajamas. In ED: Blood pressure (!) 145/49, pulse 76, temperature 98.1 F (36.7 C), temperature source Oral, resp. rate 12, SpO2 92%. LABs: CBC BMP reviewed chloride 97, glucose 203, WBC 13.9, CT head/cervical spine, chronic odontoid fracture, hardware in place no acute fractures identified X-ray right hip: Acute, mildly displaced right femoral neck fracture.  Patient was admitted for further management S/p right hemiarthroplasty by Dr. [name] 12/29 and now WBAT on the right leg and PT recommending skilled nursing facility  Subjective: Seen and examined  Daughter at the bedside Patient waking up but alert awake Remains afebrile vital stable labs shows leukocytosis resolved 16>>10, hb 11>>9.7  Assessment and plan:  Closed right hip fracture 2/2 mechanical fall: CT head/cervical spine on admit no acute fracture. X-ray right hip w/ acute, mildly displaced right femoral neck fracture.  S/p right hemiarthroplasty by Dr. [name] 12/29 Cont WBAT on RLE, cont pain management,Bowel regimen as per ortho. On asa 81 mg bid for DVT prophylaxis Cont PTOT, waiting for SNF at this time  CKD stage II: Creatinine stable.  Monitor.   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  Continuing aspirin , Toprol     T1 DM since age of 58, with uncontrolled hyperglycemia on long term insulin , with hypoglycemia 12/31 am: Last A1c 9.7-7  months ago.  On Lantus  6 u bid-continue home regimen blood sugar at times dropped to 52 this morning -change to 4u in bid and cahnge to 0-9 u ssi achs from q4h Recent Labs  Lab 11/23/24 0822 11/23/24 1244 11/25/24 2018 11/26/24 0040 11/26/24 0426 11/26/24 0552 11/26/24 0838  GLUCAP  --    < > 91 136* 52* 135* 192*  HGBA1C 9.9*  --   --   --   --   --   --    < > = values in this interval not displayed.     Seizures disorder: Per patient and daughter questionable absence seizure episodes. Cont home Keppra    Protein-calorie malnutrition, severe Augment diet as able.     Compression fracture of T12 vertebra Chronic. With some gait abnormality, per daughter still ambulates with assistance. Ptot   Anemia of chronic disease ABLA due to fracture/surgery: Hemoglobin dropping but remains stable monitor and transfuse if less than 7 g Recent Labs  Lab 11/23/24 0822 11/24/24 0409 11/25/24 0505 11/26/24 0438  HGB 13.2 11.1* 9.4* 9.7*  HCT 40.4 34.5* 28.8* 30.2*    Dyslipidemia Continue statins    Bilateral carotid artery disease Continue statins, aspirin    Intracerebral hemorrhage: In 2020, no further complication, or debility, stable ambulates with cane CT head IN ED no acute intracranial abnormalities, chronic changes   Mobility: PT Orders: Active PT Follow up Rec: Skilled Nursing-Short Term Rehab (<3 Hours/Day)11/25/2024 0840   DVT prophylaxis: SCDs Start: 11/24/24 1848 Place TED hose Start: 11/24/24 1848 Place and maintain sequential compression device Start: 11/23/24 1028 SCDs Start: 11/23/24 1026 Code Status:   Code Status: Full Code Family Communication: plan of care discussed  with patient and daughter  Patient status is: Remains hospitalized because of severity of illness Level of care: Telemetry   Dispo: The patient is from: Home with hospice            Anticipated disposition: Anticipating SNF-waiting to discuss TOC planning to revoke hospice and SNF if they  decide that  Objective: Vitals last 24 hrs: Vitals:   11/25/24 1956 11/26/24 0400 11/26/24 0500 11/26/24 0838  BP: (!) 140/56 (!) 152/52  (!) 139/58  Pulse:    92  Resp: 18   16  Temp: 98.4 F (36.9 C) 97.9 F (36.6 C)  98.3 F (36.8 C)  TempSrc: Oral Oral  Oral  SpO2: 92%  93% 94%   Physical Examination: General exam: AAO, thin frail HEENT:Oral mucosa moist, Ear/Nose WNL grossly Respiratory system: Bilaterally clear BS,no use of accessory muscle Cardiovascular system: S1 & S2 +, No JVD. Gastrointestinal system: Abdomen soft,NT,ND, BS+ Nervous System: Alert, awake, moving all extremities w/ limited mobility in the right hip due to pain Extremities: extremities warm, leg edema neg, RT HIP aquacel dressing + c/d/i Skin: Warm, no rashes MSK: Normal muscle bulk,tone, power   Medications reviewed:  Scheduled Meds:  aspirin  EC  81 mg Oral BID   docusate sodium   100 mg Oral BID   insulin  aspart  0-6 Units Subcutaneous TID WC   insulin  glargine  4 Units Subcutaneous BID   levETIRAcetam   250 mg Oral BID   metoprolol  tartrate  25 mg Oral BID   montelukast   10 mg Oral QHS   mupirocin  ointment  1 Application Nasal BID   senna-docusate  1 tablet Oral QHS   simvastatin   40 mg Oral QHS   sodium chloride  flush  3 mL Intravenous Q12H  Continuous Infusions: Diet: Diet Order             Diet Carb Modified Room service appropriate? Yes  Diet effective now                  Data Reviewed: I have personally reviewed following labs and imaging studies ( see epic result tab) CBC: Recent Labs  Lab 11/23/24 0822 11/24/24 0409 11/25/24 0505 11/26/24 0438  WBC 13.9* 16.5* 14.7* 10.3  NEUTROABS 11.9*  --   --   --   HGB 13.2 11.1* 9.4* 9.7*  HCT 40.4 34.5* 28.8* 30.2*  MCV 85.2 86.7 85.2 86.0  PLT 212 229 196 199   CMP: Recent Labs  Lab 11/23/24 0822 11/24/24 0409 11/25/24 0505  NA 135 136 136  K 4.3 4.4 4.5  CL 97* 99 101  CO2 27 20* 23  GLUCOSE 203* 322* 334*  BUN 17  23 26*  CREATININE 0.71 0.76 0.69  CALCIUM 9.0 8.4* 8.3*  MG 2.0  --   --   PHOS 3.1  --   --    GFR: CrCl cannot be calculated (Unknown ideal weight.). No results for input(s): AST, ALT, ALKPHOS, BILITOT, PROT, ALBUMIN in the last 168 hours. No results for input(s): LIPASE, AMYLASE in the last 168 hours. No results for input(s): AMMONIA in the last 168 hours. Coagulation Profile:  Recent Labs  Lab 11/23/24 0822 11/24/24 0409  INR 0.9 1.1   Unresulted Labs (From admission, onward)     Start     Ordered   11/23/24 1026  Expectorated Sputum Assessment w Gram Stain, Rflx to Resp Cult  Once,   R       Comments: If productive cough  11/23/24 1026           Antimicrobials/Microbiology: Anti-infectives (From admission, onward)    Start     Dose/Rate Route Frequency Ordered Stop   11/25/24 0600  levofloxacin  (LEVAQUIN ) IVPB 500 mg        500 mg 100 mL/hr over 60 Minutes Intravenous On call to O.R. 11/24/24 1205 11/24/24 1559         Component Value Date/Time   SDES BLOOD SITE NOT SPECIFIED 04/01/2024 2315   SPECREQUEST  04/01/2024 2315    BOTTLES DRAWN AEROBIC AND ANAEROBIC Blood Culture results may not be optimal due to an inadequate volume of blood received in culture bottles   CULT  04/01/2024 2315    NO GROWTH 5 DAYS Performed at [facility], [address], [place], [place] [address]    REPTSTATUS 04/06/2024 FINAL 04/01/2024 2315    Procedures: Procedures (LRB): HEMIARTHROPLASTY, HIP, DIRECT ANTERIOR APPROACH, FOR FRACTURE (Right) IRRIGATION AND DEBRIDEMENT ELBOW WITH WOUND CLOSURE (Right)   Mennie LAMY, MD [facility] 11/26/2024, 9:55 AM   "

## 2024-11-26 NOTE — NC FL2 (Signed)
 " [place]  MEDICAID FL2 LEVEL OF CARE FORM     IDENTIFICATION  Patient Name: Danielle Clarke Birthdate: [birth year] Sex: female Admission Date (Current Location): 11/23/2024  [facility] and [place] Number:  Producer, Television/film/video and Address:  [facility] [place], [address] [place], [place] [address]      Provider Number: [id number]  Attending Physician Name and Address:  Christobal Guadalajara, MD  Relative Name and Phone Number:  Netto,[name]  Daughter, Emergency Contact  [phone] (Mobile)    Current Level of Care: Hospital Recommended Level of Care: Skilled Nursing Facility Prior Approval Number:    Date Approved/Denied:   PASRR Number: [id number]  Discharge Plan: SNF    Current Diagnoses: Patient Active Problem List   Diagnosis Date Noted   Closed right hip fracture (HCC) 11/23/2024   Seizures (HCC) 11/23/2024   Protein-calorie malnutrition, severe 04/03/2024   Acute hypoxic respiratory failure (HCC) 04/01/2024   Compression fracture of T12 vertebra (HCC) 04/01/2024   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   Closed comminuted left humeral fracture 11/19/2021   Prolonged QT interval 11/19/2021   CKD (chronic kidney disease), stage III (HCC) 10/14/2020   Abnormality of gait 12/01/2019   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) 12/01/2019   Traumatic brain injury with loss of consciousness (HCC) 05/28/2019   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 05/20/2019   Labile blood pressure    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    Multiple closed fractures of ribs of right side    Hypoalbuminemia due to protein-calorie malnutrition    Traumatic cerebral intraparenchymal hematoma (HCC) 02/14/2019   Pacemaker    Labile blood glucose    Dyslipidemia    Anemia of chronic disease    Intracerebral hemorrhage (HCC) 02/07/2019   Intracranial bleeding (HCC) 02/06/2019   Aortic  stenosis 08/25/2015   Cervical stenosis of spinal canal 07/31/2014   Essential hypertension, benign 12/30/2013   Loss of consciousness (HCC) 01/02/2013   Diabetes mellitus type 1 (HCC) 01/02/2013   Bilateral carotid artery disease 01/02/2013   GOITER, MULTINODULAR 02/11/2008    Orientation RESPIRATION BLADDER Height & Weight     Self  Normal Incontinent Weight:   Height:     BEHAVIORAL SYMPTOMS/MOOD NEUROLOGICAL BOWEL NUTRITION STATUS      Continent Diet (see d/c summary)  AMBULATORY STATUS COMMUNICATION OF NEEDS Skin   Extensive Assist Verbally Surgical wounds (incision right hip; incision right elbow)                       Personal Care Assistance Level of Assistance  Bathing, Feeding, Dressing Bathing Assistance: Maximum assistance Feeding assistance: Independent Dressing Assistance: Maximum assistance     Functional Limitations Info  Sight, Hearing, Speech Sight Info: Adequate Hearing Info: Adequate Speech Info: Adequate    SPECIAL CARE FACTORS FREQUENCY  OT (By licensed OT), PT (By licensed PT)     PT Frequency: 5x/week OT Frequency: 5x/week            Contractures Contractures Info: Not present    Additional Factors Info  Code Status, Allergies Code Status Info: full code Allergies Info: Augmentin (amoxicillin-pot Clavulanate), nsaids, Ceclor (cefaclor), Firvanq  (vancomycin ), Sumycin (tetracycline), Accupril  (quinapril  Hcl), Bactrim  (sulfamethoxazole -trimethoprim ), Fosamax (alendronate Sodium), Deltasone (prednisone)           Current Medications (11/26/2024):  This is the current hospital active medication list Current Facility-Administered Medications  Medication Dose Route  Frequency Provider Last Rate Last Admin   acetaminophen  (TYLENOL ) tablet 650 mg  650 mg Oral Q6H PRN Danton Lauraine LABOR, PA-C       Or   acetaminophen  (TYLENOL ) suppository 650 mg  650 mg Rectal Q6H PRN Danton Lauraine LABOR, PA-C       aspirin  EC tablet 81 mg  81 mg Oral BID  Danton Lauraine LABOR, PA-C   81 mg at 11/26/24 0945   docusate sodium  (COLACE) capsule 100 mg  100 mg Oral BID Danton Lauraine LABOR, PA-C   100 mg at 11/26/24 0945   hydrALAZINE  (APRESOLINE ) injection 10 mg  10 mg Intravenous Q4H PRN Danton Lauraine LABOR, PA-C       HYDROmorphone  (DILAUDID ) injection 0.5 mg  0.5 mg Intravenous Q3H PRN Danton Lauraine LABOR, PA-C       insulin  aspart (novoLOG ) injection 0-6 Units  0-6 Units Subcutaneous TID WC Kc, Ramesh, MD       insulin  glargine (LANTUS ) injection 4 Units  4 Units Subcutaneous BID Kc, Mennie, MD   4 Units at 11/26/24 0945   ipratropium (ATROVENT ) nebulizer solution 0.5 mg  0.5 mg Nebulization Q6H PRN Danton Lauraine LABOR, PA-C       levETIRAcetam  (KEPPRA ) tablet 250 mg  250 mg Oral BID Danton Lauraine LABOR, PA-C   250 mg at 11/26/24 0945   menthol  (CEPACOL) lozenge 3 mg  1 lozenge Oral PRN Danton Lauraine LABOR, PA-C       Or   phenol (CHLORASEPTIC) mouth spray 1 spray  1 spray Mouth/Throat PRN Danton Lauraine LABOR, PA-C       methocarbamol  (ROBAXIN ) tablet 500 mg  500 mg Oral Q8H PRN Danton Lauraine LABOR, PA-C   500 mg at 11/25/24 9473   Or   methocarbamol  (ROBAXIN ) injection 500 mg  500 mg Intravenous Q8H PRN Danton Lauraine LABOR, PA-C       metoCLOPramide  (REGLAN ) tablet 5-10 mg  5-10 mg Oral Q8H PRN Danton, Sarah A, PA-C       Or   metoCLOPramide  (REGLAN ) injection 5-10 mg  5-10 mg Intravenous Q8H PRN Danton Lauraine LABOR, PA-C       metoprolol  tartrate (LOPRESSOR ) tablet 25 mg  25 mg Oral BID Danton Lauraine LABOR, PA-C   25 mg at 11/26/24 0945   montelukast  (SINGULAIR ) tablet 10 mg  10 mg Oral QHS Danton Lauraine LABOR, PA-C   10 mg at 11/25/24 2109   mupirocin  ointment (BACTROBAN ) 2 % 1 Application  1 Application Nasal BID Danton Lauraine LABOR, PA-C   1 Application at 11/26/24 0945   ondansetron  (ZOFRAN ) injection 4 mg  4 mg Intravenous Q6H PRN Danton Lauraine LABOR, PA-C   4 mg at 11/24/24 1705   oxyCODONE  (Oxy IR/ROXICODONE ) immediate release tablet 2.5-5 mg  2.5-5 mg Oral Q4H PRN Danton Lauraine LABOR,  PA-C   5 mg at 11/25/24 1717   senna-docusate (Senokot-S) tablet 1 tablet  1 tablet Oral QHS Danton Lauraine LABOR, PA-C   1 tablet at 11/25/24 2109   simvastatin  (ZOCOR ) tablet 40 mg  40 mg Oral QHS Danton Lauraine LABOR, PA-C   40 mg at 11/25/24 2108   sodium chloride  flush (NS) 0.9 % injection 3 mL  3 mL Intravenous Q12H Danton Lauraine LABOR, PA-C   3 mL at 11/26/24 0945   traZODone  (DESYREL ) tablet 25 mg  25 mg Oral QHS PRN Danton Lauraine LABOR, PA-C   25 mg at 11/25/24 2109     Discharge Medications: Please see discharge summary for a  list of discharge medications.  Relevant Imaging Results:  Relevant Lab Results:   Additional Information SSN: [id number] [place], LCSW     "

## 2024-11-26 NOTE — Progress Notes (Signed)
 Physical Therapy Treatment Patient Details Name: Danielle Clarke MRN: [id number] DOB: July 03, 1941 Today's Date: 11/26/2024   History of Present Illness 83 y.o. female presents 11/23/24  after mechanical fall. X-ray right hip: Acute, mildly displaced right femoral neck fracture. S/p R hip hemiarthroplasty 12/29. PMH:  DM1, HTN, HLD, aortic stenosis, s/p TAVR, seizures, recurrent falls, multiple bone fractures in the past, hemorrhagic stroke in 2020, progressive weakness, generalized cachexia, under hospice care at home.    PT Comments  Pt received up in chair; had been sitting up for approximately ~4 hours. Pt appeared very fatigue. Requiring maxA for squat pivot transfer towards left back to bed. Performed RLE AAROM exercises at bed level. Patient will benefit from continued inpatient follow up therapy, <3 hours/day.    If plan is discharge home, recommend the following: A lot of help with walking and/or transfers;A lot of help with bathing/dressing/bathroom   Can travel by private vehicle     No  Equipment Recommendations  None recommended by PT    Recommendations for Other Services       Precautions / Restrictions Precautions Precautions: Fall Restrictions Weight Bearing Restrictions Per Provider Order: No     Mobility  Bed Mobility Overal bed mobility: Needs Assistance Bed Mobility: Sit to Supine       Sit to supine: Max assist   General bed mobility comments: BLE elevation back into bed. Use of Trendelenberg function and bed pad to scoot up    Transfers Overall transfer level: Needs assistance Equipment used: 1 person hand held assist Transfers: Bed to chair/wheelchair/BSC       Squat pivot transfers: Max assist     General transfer comment: Face to face transfer from chair to bed with maxA    Ambulation/Gait                   Stairs             Wheelchair Mobility     Tilt Bed    Modified Rankin (Stroke Patients Only)        Balance Overall balance assessment: Needs assistance Sitting-balance support: Feet supported Sitting balance-Leahy Scale: Fair                                      Hotel Manager: No apparent difficulties  Cognition Arousal: Alert Behavior During Therapy: WFL for tasks assessed/performed   PT - Cognitive impairments: No family/caregiver present to determine baseline                         Following commands: Impaired Following commands impaired: Follows one step commands with increased time    Cueing Cueing Techniques: Verbal cues  Exercises General Exercises - Lower Extremity Ankle Circles/Pumps: AROM, Right, 5 reps, Supine Heel Slides: AAROM, Right, 5 reps, Supine Hip ABduction/ADduction: AAROM, Right, 5 reps, Supine    General Comments        Pertinent Vitals/Pain Pain Assessment Pain Assessment: Faces Faces Pain Scale: Hurts whole lot Pain Location: R hip with mobility Pain Descriptors / Indicators: Operative site guarding, Grimacing, Moaning Pain Intervention(s): Limited activity within patient's tolerance, Monitored during session    Home Living                          Prior Function  PT Goals (current goals can now be found in the care plan section) Acute Rehab PT Goals Patient Stated Goal: to go home PT Goal Formulation: With patient Time For Goal Achievement: 12/09/24 Potential to Achieve Goals: Fair    Frequency    Min 2X/week      PT Plan      Co-evaluation              AM-PAC PT 6 Clicks Mobility   Outcome Measure  Help needed turning from your back to your side while in a flat bed without using bedrails?: A Lot Help needed moving from lying on your back to sitting on the side of a flat bed without using bedrails?: A Lot Help needed moving to and from a bed to a chair (including a wheelchair)?: A Lot Help needed standing up from a chair using your arms  (e.g., wheelchair or bedside chair)?: A Lot Help needed to walk in hospital room?: Total Help needed climbing 3-5 steps with a railing? : Total 6 Click Score: 10    End of Session Equipment Utilized During Treatment: Gait belt Activity Tolerance: Patient limited by fatigue Patient left: with call bell/phone within reach;in bed;with bed alarm set Nurse Communication: Mobility status PT Visit Diagnosis: Pain;Unsteadiness on feet (R26.81);History of falling (Z91.81);Muscle weakness (generalized) (M62.81);Difficulty in walking, not elsewhere classified (R26.2) Pain - Right/Left: Right Pain - part of body: Hip     Time: 8587-8571 PT Time Calculation (min) (ACUTE ONLY): 16 min  Charges:    $Therapeutic Activity: 8-22 mins PT General Charges $$ ACUTE PT VISIT: 1 Visit                     Aleck Daring, PT, DPT Acute Rehabilitation Services Office [phone]    [name] 11/26/2024, 3:32 PM

## 2024-11-26 NOTE — Progress Notes (Signed)
 Orthopaedic Trauma Progress Note  SUBJECTIVE: Doing fairly well this morning.  About to eat breakfast.  Pain controlled at rest.  Is agreeable to SNF.  Patient's daughter is at bedside.  She agrees to revoke patient's hospice for SNF placement.  Patient denies any numbness or tingling throughout the right lower extremity. No issues with the right elbow.  No chest pain or SOB this AM. No nausea/vomiting. No other complaints.    OBJECTIVE:  Vitals:   11/26/24 0500 11/26/24 0838  BP:  (!) 139/58  Pulse:  92  Resp:  16  Temp:  98.3 F (36.8 C)  SpO2: 93% 94%    Opiates Today (MME): Today's  total administered Morphine  Milligram Equivalents: 0 Opiates Yesterday (MME): Yesterday's total administered Morphine  Milligram Equivalents: 7.5  General: Patient sitting up in bed.  No acute distress Respiratory: No increased work of breathing.  Operative Extremity (RLE): Aquacel dressing clean, dry, intact.  Soreness with palpation about the hip as expected but otherwise no areas of significant tenderness throughout the extremity.  No significant calf tenderness.  Compartments soft/compressible.  Tolerates gentle ankle range of motion.  Endorses sensation light touch over all aspects of the foot.  Able to wiggle the toes. + DP pulse RUE: Dressing over the elbow has been removed.  Laceration is clean, dry, intact.  Some soreness with palpation about the elbow.  No significant pain with motion.  Neurovascularly intact distally.  IMAGING: Stable post op imaging R hip  LABS:  Results for orders placed or performed during the hospital encounter of 11/23/24 (from the past 24 hours)  Glucose, capillary     Status: Abnormal   Collection Time: 11/25/24 11:24 AM  Result Value Ref Range   Glucose-Capillary 212 (H) 70 - 99 mg/dL  Glucose, capillary     Status: Abnormal   Collection Time: 11/25/24  4:30 PM  Result Value Ref Range   Glucose-Capillary 182 (H) 70 - 99 mg/dL  Glucose, capillary     Status: None    Collection Time: 11/25/24  8:18 PM  Result Value Ref Range   Glucose-Capillary 91 70 - 99 mg/dL  Glucose, capillary     Status: Abnormal   Collection Time: 11/26/24 12:40 AM  Result Value Ref Range   Glucose-Capillary 136 (H) 70 - 99 mg/dL  Glucose, capillary     Status: Abnormal   Collection Time: 11/26/24  4:26 AM  Result Value Ref Range   Glucose-Capillary 52 (L) 70 - 99 mg/dL  CBC     Status: Abnormal   Collection Time: 11/26/24  4:38 AM  Result Value Ref Range   WBC 10.3 4.0 - 10.5 K/uL   RBC 3.51 (L) 3.87 - 5.11 MIL/uL   Hemoglobin 9.7 (L) 12.0 - 15.0 g/dL   HCT 69.7 (L) 63.9 - 53.9 %   MCV 86.0 80.0 - 100.0 fL   MCH 27.6 26.0 - 34.0 pg   MCHC 32.1 30.0 - 36.0 g/dL   RDW 84.6 88.4 - 84.4 %   Platelets 199 150 - 400 K/uL   nRBC 0.0 0.0 - 0.2 %  Glucose, capillary     Status: Abnormal   Collection Time: 11/26/24  5:52 AM  Result Value Ref Range   Glucose-Capillary 135 (H) 70 - 99 mg/dL  Glucose, capillary     Status: Abnormal   Collection Time: 11/26/24  8:38 AM  Result Value Ref Range   Glucose-Capillary 192 (H) 70 - 99 mg/dL    ASSESSMENT: Danielle Clarke is  a 83 y.o. female, 2 Days Post-Op s/p fall Procedures: RIGHT HIP HEMIARTHROPLASTY FOR FRACTURE, ANTERIOR APPROACH  IRRIGATION DEBRIDEMENT RIGHT ELBOW WITH WOUND CLOSURE  CV/Blood loss: Acute blood loss anemia, Hgb 9.7 this AM. Hemodynamically stable  PLAN: Weightbearing: WBAT RLE ROM: Unrestricted ROM Incisional and dressing care: Keep Aquacel dressing in place to right hip until follow-up.  Change RUE Mepilex dressing as needed Showering: Aquacel dressing may get wet.  Right elbow laceration okay to begin getting wet starting 11/27/2024  Orthopedic device(s): None  Pain management:  1. Tylenol  650 mg q 6 hours PRN 2. Robaxin  500 mg q 8 hours PRN 3. Oxycodone  2.5-5 mg q 4 hours PRN 4. Dilaudid  0.5 mg q 3 hours PRN VTE prophylaxis: Aspirin , SCDs ID: Perioperative ABX completed Foley/Lines:  No foley,  KVO IVFs Dispo: PT/OT evaluation ongoing, recommending SNF.  Patient and daughter in agreement.  Okay for discharge from ortho standpoint once cleared by medicine team and therapies.  Discharge Rx for pain medication and muscle relaxer have been placed in patient's chart  D/C recommendations: - Oxycodone  2.5 to 5 mg, Robaxin , Tylenol  for pain control - Aspirin  81 mg for DVT prophylaxis   Follow - up plan: 2 weeks after d/c for wound check and repeat x-rays   Contact information:  Franky Light MD, Lauraine Moores PA-C. After hours and holidays please check [email or web address] for group call information for [facility]   Lauraine PATRIC Moores, PA-C [phone] (office) [email or web address]

## 2024-11-26 NOTE — TOC Initial Note (Addendum)
 Transition of Care [facility]) - Initial/Assessment Note    Patient Details  Name: Danielle Clarke MRN: [id number] Date of Birth: Jan 10, 1941  Transition of Care [facility]) CM/SW Contact:    Luann SHAUNNA Cumming, LCSW Phone Number: 11/26/2024, 11:27 AM  Clinical Narrative:                  CSW met with pt's daughter, [name], bedside. She confirmed she would like pt to go to SNF for short term rehab at [place] and understands it would mean revoking hospice benefit. CSW explained SNF w/u process and medicare coverage. Pt has been to [facility] in the past and this would be daughters preferences. Fl2 completed and bed requests sent in hub.  1330: [facility] can offer a bed and can admit pt on Friday. They are aware pt would be rescinding hospice which should mean pt's medicare would transition to traditional medicare and would not need auth.  [employer] shara is started anyways just in case and is pending. [id number]  CSW notified [facility] who will speak with pt's daughter.   Expected Discharge Plan: Skilled Nursing Facility Barriers to Discharge: Continued Medical Work up, SNF Pending bed offer        Prior Living Arrangements/Services     Patient language and need for interpreter reviewed:: Yes        Need for Family Participation in Patient Care: Yes (Comment) Care giver support system in place?: Yes (comment)   Criminal Activity/Legal Involvement Pertinent to Current Situation/Hospitalization: No - Comment as needed  Activities of Daily Living      Permission Sought/Granted                  Emotional Assessment Appearance:: Appears stated age Attitude/Demeanor/Rapport: Lethargic   Orientation: : Oriented to Self Alcohol / Substance Use: Not Applicable Psych Involvement: No (comment)  Admission diagnosis:  Closed right hip fracture (HCC) [S72.001A] Closed fracture of right hip, initial encounter (HCC) [S72.001A] Patient Active Problem List   Diagnosis Date Noted   Closed right hip  fracture (HCC) 11/23/2024   Seizures (HCC) 11/23/2024   Protein-calorie malnutrition, severe 04/03/2024   Acute hypoxic respiratory failure (HCC) 04/01/2024   Compression fracture of T12 vertebra (HCC) 04/01/2024   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   Closed comminuted left humeral fracture 11/19/2021   Prolonged QT interval 11/19/2021   CKD (chronic kidney disease), stage III (HCC) 10/14/2020   Abnormality of gait 12/01/2019   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) 12/01/2019   Traumatic brain injury with loss of consciousness (HCC) 05/28/2019   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 05/20/2019   Labile blood pressure    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    Multiple closed fractures of ribs of right side    Hypoalbuminemia due to protein-calorie malnutrition    Traumatic cerebral intraparenchymal hematoma (HCC) 02/14/2019   Pacemaker    Labile blood glucose    Dyslipidemia    Anemia of chronic disease    Intracerebral hemorrhage (HCC) 02/07/2019   Intracranial bleeding (HCC) 02/06/2019   Aortic stenosis 08/25/2015   Cervical stenosis of spinal canal 07/31/2014   Essential hypertension, benign 12/30/2013   Loss of consciousness (HCC) 01/02/2013   Diabetes mellitus type 1 (HCC) 01/02/2013   Bilateral carotid artery disease 01/02/2013   GOITER, MULTINODULAR 02/11/2008   PCP:  [name], MD Pharmacy:   [facility] [address] [place] - [address] [place] [address] Phone: [phone] Fax: [phone]     Social Drivers of Health (SDOH) Social History: SDOH Screenings   Food Insecurity: No Food Insecurity (11/24/2024)  Housing: Low Risk (11/24/2024)  Transportation Needs: No Transportation Needs (11/24/2024)  Utilities: Patient Unable To Answer (11/24/2024)  Social Connections: Unknown (11/24/2024)  Tobacco Use: Low Risk  (11/24/2024)   SDOH Interventions:     Readmission Risk Interventions    04/23/2024   11:16 AM 04/02/2024   11:26 AM 02/07/2023   12:07 PM  Readmission Risk Prevention Plan  Post Dischage Appt   Complete  Medication Screening   Complete  Transportation Screening Complete Complete Complete  PCP or Specialist Appt within 5-7 Days Complete Complete   Home Care Screening Complete Complete   Medication Review (RN CM) Complete Referral to Pharmacy

## 2024-11-26 NOTE — Progress Notes (Signed)
 Mobility Specialist Progress Note:   11/26/24 0954  Mobility  Activity Pivoted/transferred from bed to chair  Level of Assistance Moderate assist, patient does 50-74% (+2)  Assistive Device Other (Comment) (HHA)  Distance Ambulated (ft) 5 ft  RLE Weight Bearing Per Provider Order WBAT  Activity Response Tolerated well  Mobility Referral Yes  Mobility visit 1 Mobility  Mobility Specialist Start Time (ACUTE ONLY) [id number]  Mobility Specialist Stop Time (ACUTE ONLY) 0954  Mobility Specialist Time Calculation (min) (ACUTE ONLY) 11 min   Received pt in bed and agreeable to mobility. Pt required ModA +2 to EOB and STS. Pt c/o RLE pain, otherwise tolerated well. Left pt in chair with RN present. All needs met.  Lavanda Pollack Mobility Specialist  Please contact via [employer] or  Rehab Office [phone]

## 2024-11-26 NOTE — Plan of Care (Signed)
" °  Problem: Education: Goal: Individualized Educational Video(s) Outcome: Progressing   Problem: Fluid Volume: Goal: Ability to maintain a balanced intake and output will improve Outcome: Progressing   Problem: Health Behavior/Discharge Planning: Goal: Ability to identify and utilize available resources and services will improve Outcome: Progressing Goal: Ability to manage health-related needs will improve Outcome: Progressing   Problem: Nutritional: Goal: Maintenance of adequate nutrition will improve Outcome: Progressing Goal: Progress toward achieving an optimal weight will improve Outcome: Progressing   Problem: Skin Integrity: Goal: Risk for impaired skin integrity will decrease Outcome: Progressing   Problem: Health Behavior/Discharge Planning: Goal: Ability to manage health-related needs will improve Outcome: Progressing   Problem: Clinical Measurements: Goal: Ability to maintain clinical measurements within normal limits will improve Outcome: Progressing Goal: Will remain free from infection Outcome: Progressing Goal: Cardiovascular complication will be avoided Outcome: Progressing   Problem: Activity: Goal: Risk for activity intolerance will decrease Outcome: Progressing   Problem: Nutrition: Goal: Adequate nutrition will be maintained Outcome: Progressing   Problem: Coping: Goal: Level of anxiety will decrease Outcome: Progressing   Problem: Pain Managment: Goal: General experience of comfort will improve and/or be controlled Outcome: Progressing   Problem: Safety: Goal: Ability to remain free from injury will improve Outcome: Progressing   Problem: Skin Integrity: Goal: Risk for impaired skin integrity will decrease Outcome: Progressing   Problem: Bowel/Gastric: Goal: Gastrointestinal status for postoperative course will improve Outcome: Progressing   Problem: Cardiac: Goal: Ability to maintain an adequate cardiac output Outcome:  Progressing Goal: Will show no evidence of cardiac arrhythmias Outcome: Progressing   Problem: Nutritional: Goal: Will attain and maintain optimal nutritional status Outcome: Progressing   Problem: Neurological: Goal: Will regain or maintain usual level of consciousness Outcome: Progressing   Problem: Clinical Measurements: Goal: Ability to maintain clinical measurements within normal limits Outcome: Progressing Goal: Postoperative complications will be avoided or minimized Outcome: Progressing   Problem: Respiratory: Goal: Will regain and/or maintain adequate ventilation Outcome: Progressing   Problem: Skin Integrity: Goal: Demonstrates signs of wound healing without infection Outcome: Progressing   Problem: Urinary Elimination: Goal: Will remain free from infection Outcome: Progressing Goal: Ability to achieve and maintain adequate urine output Outcome: Progressing   Problem: Education: Goal: Understanding of discharge needs will improve Outcome: Progressing Goal: Individualized Educational Video(s) Outcome: Progressing   Problem: Activity: Goal: Ability to avoid complications of mobility impairment will improve Outcome: Progressing Goal: Ability to tolerate increased activity will improve Outcome: Progressing   Problem: Clinical Measurements: Goal: Postoperative complications will be avoided or minimized Outcome: Progressing   "

## 2024-11-27 DIAGNOSIS — N183 Chronic kidney disease, stage 3 unspecified: Secondary | ICD-10-CM

## 2024-11-27 DIAGNOSIS — D638 Anemia in other chronic diseases classified elsewhere: Secondary | ICD-10-CM

## 2024-11-27 DIAGNOSIS — I10 Essential (primary) hypertension: Secondary | ICD-10-CM

## 2024-11-27 LAB — CBC
HCT: 27.6 % — ABNORMAL LOW (ref 36.0–46.0)
Hemoglobin: 9.2 g/dL — ABNORMAL LOW (ref 12.0–15.0)
MCH: 27.5 pg (ref 26.0–34.0)
MCHC: 33.3 g/dL (ref 30.0–36.0)
MCV: 82.4 fL (ref 80.0–100.0)
Platelets: 207 K/uL (ref 150–400)
RBC: 3.35 MIL/uL — ABNORMAL LOW (ref 3.87–5.11)
RDW: 14.7 % (ref 11.5–15.5)
WBC: 8.7 K/uL (ref 4.0–10.5)
nRBC: 0 % (ref 0.0–0.2)

## 2024-11-27 LAB — GLUCOSE, CAPILLARY
Glucose-Capillary: 161 mg/dL — ABNORMAL HIGH (ref 70–99)
Glucose-Capillary: 176 mg/dL — ABNORMAL HIGH (ref 70–99)
Glucose-Capillary: 178 mg/dL — ABNORMAL HIGH (ref 70–99)
Glucose-Capillary: 191 mg/dL — ABNORMAL HIGH (ref 70–99)
Glucose-Capillary: 199 mg/dL — ABNORMAL HIGH (ref 70–99)
Glucose-Capillary: 283 mg/dL — ABNORMAL HIGH (ref 70–99)
Glucose-Capillary: 285 mg/dL — ABNORMAL HIGH (ref 70–99)

## 2024-11-27 MED ORDER — DOCUSATE SODIUM 100 MG PO CAPS: 100.0000 mg | ORAL_CAPSULE | Freq: Two times a day (BID) | ORAL | Status: AC

## 2024-11-27 MED ORDER — LOSARTAN POTASSIUM 25 MG PO TABS
12.5000 mg | ORAL_TABLET | Freq: Every day | ORAL | Status: DC
  Administered 2024-11-27: 12.5 mg via ORAL
  Filled 2024-11-27 (×2): qty 0.5

## 2024-11-27 MED ORDER — SIMVASTATIN 40 MG PO TABS: 40.0000 mg | ORAL_TABLET | Freq: Every day | ORAL | Status: DC

## 2024-11-27 MED ORDER — TRAZODONE HCL 50 MG PO TABS: 25.0000 mg | ORAL_TABLET | Freq: Every evening | ORAL | Status: DC | PRN

## 2024-11-27 MED ORDER — SENNOSIDES-DOCUSATE SODIUM 8.6-50 MG PO TABS: 1.0000 | ORAL_TABLET | Freq: Every day | ORAL | Status: DC

## 2024-11-27 NOTE — TOC Progression Note (Signed)
 Transition of Care [facility]) - Progression Note    Patient Details  Name: Danielle Clarke MRN: [id number] Date of Birth: [birth year]  Transition of Care [facility]) CM/SW Contact  Roslyn Else E Arkel Cartwright, LCSW Phone Number: 11/27/2024, 9:24 AM  Clinical Narrative:    [name] is approved for [facility] - valid 12/31-1/2 [place] shara ID # [id number].   Expected Discharge Plan: Skilled Nursing Facility Barriers to Discharge: Continued Medical Work up, SNF Pending bed offer               Expected Discharge Plan and Services                                               Social Drivers of Health (SDOH) Interventions SDOH Screenings   Food Insecurity: No Food Insecurity (11/24/2024)  Housing: Low Risk (11/24/2024)  Transportation Needs: No Transportation Needs (11/24/2024)  Utilities: Patient Unable To Answer (11/24/2024)  Social Connections: Unknown (11/24/2024)  Tobacco Use: Low Risk (11/24/2024)    Readmission Risk Interventions    04/23/2024   11:16 AM 04/02/2024   11:26 AM 02/07/2023   12:07 PM  Readmission Risk Prevention Plan  Post Dischage Appt   Complete  Medication Screening   Complete  Transportation Screening Complete Complete Complete  PCP or Specialist Appt within 5-7 Days Complete Complete   Home Care Screening Complete Complete   Medication Review (RN CM) Complete Referral to Pharmacy

## 2024-11-27 NOTE — Progress Notes (Signed)
 PRN medication given for fever, pt unable to comprehend nor demonstrate how to use to use incentive spirometer this nurse encouraged to turn and deep cough

## 2024-11-27 NOTE — Plan of Care (Signed)
   Problem: Education: Goal: Knowledge of General Education information will improve Description: Including pain rating scale, medication(s)/side effects and non-pharmacologic comfort measures Outcome: Progressing   Problem: Activity: Goal: Risk for activity intolerance will decrease Outcome: Progressing   Problem: Skin Integrity: Goal: Risk for impaired skin integrity will decrease Outcome: Progressing

## 2024-11-27 NOTE — Progress Notes (Addendum)
 "          [facility]                                                                              Danielle Clarke, is a 84 y.o. female, DOB - [birth year], FMW:[id number] Admit date - 11/23/2024    Outpatient Primary MD for the patient is [name], Velna SAUNDERS, MD  LOS - 3  days  Chief Complaint  Patient presents with   Fall       Brief summary   Patient is a 84 year old female with DM1, HTN, HLD, aortic stenosis, s/p TAVR, seizures, recurrent falls, multiple bone fractures in the past, hemorrhagic stroke in 2020, progressive weakness, generalized cachexia, under hospice care at home who presented to the ED, post mechanical, accidental fall as she was trying to put on her pajamas.  X-ray of the right hip showed acute mildly displaced right femoral neck fracture. Orthopedics consulted, underwent right hemiarthroplasty by Dr. [name] 12/29 and now WBAT on the right leg and PT recommending skilled nursing facility   Assessment & Plan    Mechanical fall with closed right hip fracture - CT head/cervical spine on admit no acute fracture.  - X-ray right hip w/ acute, mildly displaced right femoral neck fracture.  - S/p right hemiarthroplasty by Dr. [name] 12/29 -Per orthopedics, cont WBAT on RLE, cont pain management.  Aspirin  81 mg BID for DVT prophylaxis - PT evaluation recommended SNF   CKD stage II: - Creatinine stable.  Monitor.   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 diet   Compression fracture of T12 vertebra Chronic. With some gait abnormality, per daughter still ambulates with assistance.    Anemia of chronic disease ABLA due to fracture/surgery: -H&H currently stable, transfuse for hemoglobin < 8   Dyslipidemia Continue statins    Bilateral carotid artery disease Continue statins, aspirin    Intracerebral hemorrhage: In 2020, no further complication, or debility, stable ambulates with cane CT head showed no acute intracranial abnormalities, chronic changes  Estimated body mass index is 20.54 kg/m as calculated from the following:   Height as of 04/15/24: 5' (1.524 m).   Weight as of this encounter: 47.7 kg.  Code Status: Full code DVT Prophylaxis:  SCDs Start: 11/24/24 1848 Place TED hose Start: 11/24/24 1848 Place and maintain sequential compression device Start: 11/23/24 1028 SCDs Start: 11/23/24 1026   Level of Care: Level of care: Telemetry Family Communication: Updated patient Disposition Plan:      Remains inpatient appropriate: Per TOC, plan to DC to SNF tomorrow, no  beds available today   Procedures:  Right hip hemiarthroplasty for right femoral neck fracture Repair of right elbow laceration/skin tear  Consultants:   Orthopedics  Antimicrobials:   Anti-infectives (From admission, onward)    Start     Dose/Rate Route Frequency Ordered Stop   11/25/24 0600  levofloxacin  (LEVAQUIN ) IVPB 500 mg        500 mg 100 mL/hr over 60 Minutes Intravenous On call to O.R. 11/24/24 1205 11/24/24 1559          Medications  aspirin  EC  81 mg Oral BID   docusate sodium   100 mg Oral BID   insulin  aspart  0-6 Units Subcutaneous TID WC   insulin  glargine  4 Units Subcutaneous BID   levETIRAcetam   250 mg Oral BID   metoprolol  tartrate  25 mg Oral BID   montelukast   10 mg Oral QHS   mupirocin  ointment  1 Application  Nasal BID   senna-docusate  1 tablet Oral QHS   simvastatin   40 mg Oral QHS   sodium chloride  flush  3 mL Intravenous Q12H      Subjective:   Danielle Clarke was seen and examined today.  No acute complaints.  Alert and awake.  Patient denies dizziness, chest pain, shortness of breath, abdominal pain, N/V.  No acute events overnight.  Appears slightly disoriented today, ?  Baseline.  Objective:   Vitals:   11/26/24 1855 11/26/24 2201 11/27/24 0416 11/27/24 0910  BP: (!) 150/61 (!) 172/66 (!) 151/48 (!) 148/49  Pulse:  96 78 95  Resp:  18 18 18   Temp:  98.4 F (36.9 C) 98.1 F (36.7 C) 98.1 F (36.7 C)  TempSrc:  Oral Oral   SpO2:  98%  94%  Weight:   47.7 kg     Intake/Output Summary (Last 24 hours) at 11/27/2024 0955 Last data filed at 11/27/2024 0546 Gross per 24 hour  Intake --  Output 500 ml  Net -500 ml     Wt Readings from Last 3 Encounters:  11/27/24 47.7 kg  04/15/24 39.5 kg  04/02/24 39.5 kg     Exam General: Alert and oriented, NAD, frail Cardiovascular: S1 S2 auscultated,  RRR Respiratory: Clear to auscultation bilaterally, no wheezing, rales or rhonchi Gastrointestinal: Soft, nontender, nondistended, + bowel sounds Ext: no pedal edema bilaterally Neuro: No new deficits Psych: Normal affect    Data Reviewed:  I have personally reviewed following labs    CBC Lab Results  Component Value Date   WBC 8.7 11/27/2024   RBC 3.35 (L) 11/27/2024   HGB 9.2 (L) 11/27/2024   HCT 27.6 (L) 11/27/2024   MCV 82.4 11/27/2024   MCH 27.5 11/27/2024   PLT 207 11/27/2024   MCHC 33.3 11/27/2024   RDW 14.7 11/27/2024   LYMPHSABS 1.1 11/23/2024   MONOABS 0.7 11/23/2024   EOSABS 0.1 11/23/2024   BASOSABS 0.1 11/23/2024     Last metabolic panel Lab Results  Component Value Date   NA 136 11/25/2024   K 4.5 11/25/2024   CL 101 11/25/2024   CO2 23 11/25/2024   BUN 26 (H) 11/25/2024   CREATININE 0.69 11/25/2024   GLUCOSE 334 (H) 11/25/2024   GFRNONAA  >60 11/25/2024   GFRAA >60 02/25/2019   CALCIUM 8.3 (L) 11/25/2024   PHOS 3.1 11/23/2024   PROT 5.6 (L) 04/15/2024   ALBUMIN 2.1 (L) 04/15/2024   LABGLOB 2.9 10/23/2023   AGRATIO 1.7 04/19/2023   BILITOT 0.4 04/15/2024   ALKPHOS 66 04/15/2024  AST 19 04/15/2024   ALT 23 04/15/2024   ANIONGAP 12 11/25/2024    CBG (last 3)  Recent Labs    11/26/24 2118 11/27/24 0033 11/27/24 0415  GLUCAP 270* 199* 178*      Coagulation Profile: Recent Labs  Lab 11/23/24 0822 11/24/24 0409  INR 0.9 1.1     Radiology Studies: I have personally reviewed the imaging studies  No results found.     Nydia Distance M.D. [facility] 11/27/2024, 9:56 AM  Available via [employer] secure chat 7am-7pm After 7 pm, please refer to night coverage provider listed on amion.    "

## 2024-11-28 DIAGNOSIS — N182 Chronic kidney disease, stage 2 (mild): Secondary | ICD-10-CM

## 2024-11-28 DIAGNOSIS — E1022 Type 1 diabetes mellitus with diabetic chronic kidney disease: Secondary | ICD-10-CM

## 2024-11-28 LAB — CBC
HCT: 30.1 % — ABNORMAL LOW (ref 36.0–46.0)
Hemoglobin: 9.7 g/dL — ABNORMAL LOW (ref 12.0–15.0)
MCH: 27.5 pg (ref 26.0–34.0)
MCHC: 32.2 g/dL (ref 30.0–36.0)
MCV: 85.3 fL (ref 80.0–100.0)
Platelets: 211 K/uL (ref 150–400)
RBC: 3.53 MIL/uL — ABNORMAL LOW (ref 3.87–5.11)
RDW: 14.8 % (ref 11.5–15.5)
WBC: 9.2 K/uL (ref 4.0–10.5)
nRBC: 0 % (ref 0.0–0.2)

## 2024-11-28 LAB — GLUCOSE, CAPILLARY
Glucose-Capillary: 162 mg/dL — ABNORMAL HIGH (ref 70–99)
Glucose-Capillary: 309 mg/dL — ABNORMAL HIGH (ref 70–99)

## 2024-11-28 MED ORDER — METOPROLOL TARTRATE 25 MG PO TABS: 25.0000 mg | ORAL_TABLET | Freq: Two times a day (BID) | ORAL | Status: AC

## 2024-11-28 NOTE — Discharge Summary (Signed)
 " Physician Discharge Summary   Patient: Danielle Clarke MRN: [id number] DOB: 1941-01-28  Admit date:     11/23/2024  Discharge date: 11/28/2024  Discharge Physician: Nydia Distance, MD    PCP: [name], MD   Recommendations at discharge:    Per orthopedics, cont WBAT on RLE, cont pain management.   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  X-ray of the right hip showed acute mildly displaced right femoral neck fracture. Orthopedics consulted, underwent right hemiarthroplasty by Dr. [name] 12/29 and now WBAT on the right leg and PT recommending skilled nursing facility    Assessment and Plan:   Mechanical fall with closed right hip fracture - CT head/cervical spine on admit no acute fracture.  - X-ray right hip w/ acute, mildly displaced right femoral neck fracture.  - S/p right hemiarthroplasty by Dr. [name] 12/29 -Per orthopedics, cont WBAT on RLE, cont pain management.  Aspirin  81 mg BID for DVT prophylaxis - PT evaluation recommended SNF   CKD stage II: - Creatinine  stable at baseline.  Monitor.   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 diet   Compression fracture of T12 vertebra Chronic. With some gait abnormality, per daughter still ambulates with assistance.    Anemia of chronic disease ABLA due to fracture/surgery: -H&H currently stable.  Hemoglobin 9.7 at discharge   Dyslipidemia Continue statins    Bilateral carotid artery disease Continue statins, aspirin    Intracerebral hemorrhage: In 2020, no further complication, or debility, stable ambulates with cane CT head showed no acute intracranial abnormalities, chronic changes   Estimated body mass index is 20.54 kg/m as calculated from the following:   Height as of 04/15/24: 5' (1.524 m).   Weight as of this encounter: 47.7 kg.      Pain control - [place]  Controlled Substance Reporting System database was reviewed. and patient was instructed, not to drive, operate heavy machinery, perform activities at heights, swimming or participation in water  activities or provide baby-sitting services while on Pain, Sleep and Anxiety Medications; until their outpatient Physician has advised to do so again. Also recommended to not to take more than prescribed Pain, Sleep and Anxiety Medications.  Consultants: Orthopedics  Procedures performed: right hemiarthroplasty by Dr. [name] 12/29   Disposition: Skilled nursing facility Diet recommendation: Carb modified diet  DISCHARGE MEDICATION: Allergies as of 11/28/2024       Reactions   Augmentin  [amoxicillin-pot Clavulanate] Anaphylaxis   Ceclor [cefaclor] Anaphylaxis   Tolerated cephalexin in 2018   Firvanq  [vancomycin ] Anaphylaxis   Sumycin [tetracycline] Anaphylaxis   Accupril  [quinapril  Hcl] Other (See Comments)   Hyperkalemia   Bactrim  [sulfamethoxazole -trimethoprim ] Other (See Comments)   CKD - decreased kidney function   Fosamax [alendronate Sodium] Other (See Comments)   Unknown reaction   Nsaids Other (See Comments)   CKD   Deltasone [prednisone] Other (See Comments)   Unknown reaction        Medication List     STOP taking these medications    traMADol  50 MG tablet Commonly known as: ULTRAM        TAKE these medications    acetaminophen  500 MG tablet Commonly known as: TYLENOL  Take 500 mg by mouth every 6 (six) hours as needed for moderate pain (pain score 4-6).   aspirin  EC 81 MG tablet Take 1 tablet (81 mg total) by mouth 2 (two) times daily. Swallow whole. What changed: when to take this   docusate sodium  100 MG capsule Commonly known as: COLACE Take 1 capsule (100 mg total) by mouth 2 (two) times daily.   insulin  lispro 100 UNIT/ML injection Commonly known as: HUMALOG Inject 0-12 Units into the skin See admin instructions. Inject 0-12 units subcutaneously before meals and at bedtime per sliding scale: < 70 : contact provider 70-200 : 0 units 201-250 : 2 units 251-300 : 4 units 301-350 : 6 units 351-400 : 8 units 401-450 : 10 units 451-600 : 12 units - recheck blood sugar in 2 hours, if BS > 350 or < 100, notify provider.   Lantus  SoloStar 100 UNIT/ML Solostar Pen Generic drug: insulin  glargine Inject 6 Units into the skin 2 (two) times daily.   levETIRAcetam  250 MG tablet Commonly known as: KEPPRA  Take 1 tablet (250 mg total) by mouth 2 (two) times daily.   losartan  25 MG tablet Commonly known as: COZAAR  Take 12.5 mg by mouth at bedtime.   methocarbamol  500 MG tablet Commonly known as: ROBAXIN  Take 1 tablet (500 mg total) by  mouth every 8 (eight) hours as needed for muscle spasms.   metoprolol  tartrate 25 MG tablet Commonly known as: LOPRESSOR  Take 1 tablet (25 mg total) by mouth 2 (two) times daily. What changed: when to take this   montelukast  10 MG tablet Commonly known as: SINGULAIR  Take 10 mg by mouth at bedtime.   oxyCODONE  5 MG immediate release tablet Commonly known as: Oxy IR/ROXICODONE  Take 0.5-1 tablets (2.5-5 mg total) by mouth every 4 (four) hours as needed for moderate pain (pain score 4-6) or severe pain (pain score 7-10).   senna-docusate 8.6-50 MG tablet Commonly known as: Senokot-S Take 1 tablet by mouth at bedtime.   simvastatin  40 MG tablet Commonly known as: ZOCOR  Take 1 tablet (40 mg total) by mouth at bedtime.   traZODone  50 MG tablet Commonly known as: DESYREL  Take 0.5 tablets (25 mg total) by mouth at bedtime as needed for sleep. What changed:  when to take this reasons to take this               Discharge Care Instructions  (From admission, onward)           Start     Ordered   11/28/24  0000  If the dressing is still on your incision site when you go home, remove it on the third day after your surgery date. Remove dressing if it begins to fall off, or if it is dirty or damaged before the third day.        11/28/24 0806            Follow-up Information     Haddix, Franky SQUIBB, MD. Schedule an appointment as soon as possible for a visit in 2 week(s).   Specialty: Orthopedic Surgery Why: for wound check and repeat x-rays Contact information: [address] [place] [address] [phone]         [name], MD Follow up in 2 week(s).   Specialty: Internal Medicine Why: for hospital follow-up Contact information: [address] [place] [address] [phone]                Discharge Exam: [name] Weights   11/27/24 0416  Weight: 47.7 kg   S: No acute complaints, pain controlled.  Cleared for discharge to  SNF.  No acute events overnight.  BP (!) 123/49 (BP Location: Left Arm)   Pulse 72   Temp 98.9 F (37.2 C) (Oral)   Resp 18   Wt 47.7 kg   SpO2 94%   BMI 20.54 kg/m   Physical Exam General: Alert and oriented NAD Cardiovascular: S1 S2 clear, RRR.  Respiratory: CTAB, no wheezing Gastrointestinal: Soft, nontender, nondistended, NBS Ext: no pedal edema bilaterally Neuro: no new deficits Psych: Normal affect    Condition at discharge: fair  The results of significant diagnostics from this hospitalization (including imaging, microbiology, ancillary and laboratory) are listed below for reference.   Imaging Studies: DG HIP UNILAT W OR W/O PELVIS 2-3 VIEWS RIGHT Result Date: 11/24/2024 CLINICAL DATA:  Status post hip hemiarthroplasty. EXAM: DG HIP (WITH OR WITHOUT PELVIS) 2-3V RIGHT COMPARISON:  Preoperative imaging FINDINGS: Right hip hemiarthroplasty in expected alignment. Cerclage wire about the femoral stem. No periprosthetic lucency or fracture. Recent postsurgical change includes air and edema in the soft tissues. IMPRESSION: Right hip hemiarthroplasty without immediate postoperative complication. Electronically Signed   By: Andrea Gasman M.D.   On: 11/24/2024 18:34   DG HIP UNILAT WITH PELVIS 1V RIGHT Result Date: 11/24/2024 CLINICAL DATA:  Elective surgery. EXAM: DG HIP (WITH OR WITHOUT PELVIS) 1V RIGHT COMPARISON:  Preoperative imaging FINDINGS: Seven fluoroscopic spot views of the pelvis and right hip obtained in the operating room. Sequential images during hip arthroplasty. Cerclage wire about the femoral stem. Fluoroscopy time 9.6 seconds. Dose 0.5792 mGy. IMPRESSION: Intraoperative fluoroscopy during right hip arthroplasty. Electronically Signed   By: Andrea Gasman M.D.   On: 11/24/2024 18:34   DG C-Arm 1-60 Min-No Report Result Date: 11/24/2024 Fluoroscopy was utilized by the requesting physician.  No radiographic interpretation.   DG C-Arm 1-60 Min-No  Report Result Date: 11/24/2024 Fluoroscopy was utilized by the requesting physician.  No radiographic interpretation.   CT Head Wo Contrast Result Date: 11/23/2024 EXAM: CT HEAD WITHOUT CONTRAST 11/23/2024 09:30:15 AM TECHNIQUE: CT of the head was performed without the administration of intravenous contrast. Automated exposure control, iterative reconstruction, and/or weight based adjustment of the mA/kV was utilized to reduce the radiation dose to as low as reasonably achievable. COMPARISON: CT Head 04/15/2024, Brain MRI 04/16/2024, CT cervical spine reported separately today. CLINICAL HISTORY: 84 year old female with minor head trauma after a fall at 8:15 PM. FINDINGS: BRAIN AND VENTRICLES: No acute hemorrhage.  No evidence of acute infarct. No hydrocephalus. No extra-axial collection. No mass effect or midline shift. Pronounced chronic anterior left frontal lobe and anterior bitemporal encephalomalacia is stable. Stable brain volume. Stable ex vacuo ventricular enlargement. Stable gray white differentiation. Chronic dural calcification incidentally noted. No suspicious intracranial vascular hyperdensity. Advanced calcified atherosclerosis at the skull base. ORBITS: No acute abnormality. SINUSES: Chronic paranasal sinusitis with areas of advanced mucoperiosteal thickening is stable. SOFT TISSUES AND SKULL: No acute soft tissue abnormality. No skull fracture. Tympanic cavities and mastoids remain well aerated. IMPRESSION: 1. No acute traumatic injury identified. 2. Stable chronic left frontal and bitemporal encephalomalacia. 3. Stable chronic paranasal sinus disease. Electronically signed by: Helayne Hurst MD 11/23/2024 10:00 AM EST RP Workstation: [id number]   CT Cervical Spine Wo Contrast Result Date: 11/23/2024 EXAM: CT CERVICAL SPINE WITHOUT CONTRAST 11/23/2024 09:30:15 AM TECHNIQUE: CT of the cervical spine was performed without the administration of intravenous contrast. Multiplanar reformatted images  are provided for review. Automated exposure control, iterative reconstruction, and/or weight-based adjustment of the mA/kV was utilized to reduce the radiation dose to as low as reasonably achievable. COMPARISON: CT head reported separately today, previous cervical spine CT on 02/06/2019, and CTA head and neck on 04/15/2024. CLINICAL HISTORY: 84 year old female, fell at 8:15 PM. 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 No significant change in mild distraction and displacement since 2020, and the chronic odontoid fragment does appear solidly fused with the anterior C1 ring. 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 However, chronic anterior cervical fusion hardware spanning from C3 to C7 now demonstrates severe loosening and bone resorption at the C7 vertebral body level (series 6, image 44) and C6-C7 pseudarthrosis. Developing degenerative bilateral C6-C7 facet ankylosis, which is new from 2020. Partially visible upper thoracic spinal hyperostosis and interbody ankylosis also. C3 cortical screw loosening is less pronounced but also substantially progressed since 2020 (series 2, image 40). No superimposed acute osseous abnormality identified in the cervical spine. DEGENERATIVE CHANGES: No strong CT evidence of cervical spinal stenosis. SOFT TISSUES: Prevertebral soft tissue thickening at the cervicothoracic junction appears chronic but has progressed (series 8, image 67), but no C7 or T1 endplate erosion is associated. Stable lung apex ventilation. Partially visible left chest cardiac pacemaker device. Calcified cervical carotid artery atherosclerosis. IMPRESSION: 1. Since May 2025 substantial hardware loosening and bone resorption at C7-T1 , also C3.  Associated nonspecific prevertebral soft tissue swelling there. But but no strong CT evidence of discitis osteomyelitis. Associated C6-C7 pseudarthrosis, superimposed on otherwise solid C2 through C6 spinal fusion. 2. No superimposed acute traumatic injury identified in the cervical spine. 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 Electronically signed by: Helayne Hurst MD 11/23/2024 09:58 AM EST RP Workstation: [id number]   DG Hip Unilat With Pelvis 2-3 Views Right Result Date: 11/23/2024 CLINICAL DATA:  Fall.  Right hip pain. EXAM: DG HIP (WITH OR WITHOUT PELVIS) 2-3V RIGHT COMPARISON:  None Available. FINDINGS: Acute, mildly displaced right femoral neck fracture is seen. No evidence of hip dislocation. No pelvic fracture identified. Mild pubic symphysis degenerative changes are seen. Peripheral vascular calcification also noted. IMPRESSION: Acute, mildly displaced right femoral neck fracture. Electronically Signed   By: [name] DELENA Kil M.D.   On: 11/23/2024 09:43    Microbiology: Results for orders placed or performed during the hospital  encounter of 11/23/24  Surgical PCR screen     Status: None   Collection Time: 11/24/24  7:31 AM   Specimen: Nasal Mucosa; Nasal Swab  Result Value Ref Range Status   MRSA, PCR NEGATIVE NEGATIVE Final   Staphylococcus aureus NEGATIVE NEGATIVE Final    Comment: (NOTE) The Xpert SA Assay (FDA approved for NASAL specimens in patients 51 years of age and older), is one component of a comprehensive surveillance program. It is not intended to diagnose infection nor to guide or monitor treatment. Performed at [facility], [address] [place], [place] [address]     Labs: CBC: Recent Labs  Lab 11/23/24 [phone] 11/24/24 0409 11/25/24 0505 11/26/24 0438 11/27/24 0326 11/28/24 0526  WBC 13.9* 16.5* 14.7* 10.3 8.7 9.2  NEUTROABS 11.9*  --   --   --   --   --   HGB 13.2 11.1* 9.4* 9.7* 9.2* 9.7*  HCT 40.4 34.5* 28.8* 30.2* 27.6* 30.1*  MCV  85.2 86.7 85.2 86.0 82.4 85.3  PLT 212 229 196 199 207 211   Basic Metabolic Panel: Recent Labs  Lab 11/23/24 0822 11/24/24 0409 11/25/24 0505  NA 135 136 136  K 4.3 4.4 4.5  CL 97* 99 101  CO2 27 20* 23  GLUCOSE 203* 322* 334*  BUN 17 23 26*  CREATININE 0.71 0.76 0.69  CALCIUM 9.0 8.4* 8.3*  MG 2.0  --   --   PHOS 3.1  --   --    Liver Function Tests: No results for input(s): AST, ALT, ALKPHOS, BILITOT, PROT, ALBUMIN in the last 168 hours. CBG: Recent Labs  Lab 11/27/24 1247 11/27/24 1527 11/27/24 1652 11/27/24 2127 11/28/24 0613  GLUCAP 285* 191* 161* 176* 162*    Discharge time spent: greater than 30 minutes.  Signed: Nydia Distance, MD [facility] 11/28/2024 "

## 2024-11-28 NOTE — Progress Notes (Signed)
 Patient discharged, Important Message Letter mailed to patient.

## 2024-11-28 NOTE — TOC Transition Note (Signed)
 Transition of Care [facility]) - Discharge Note   Patient Details  Name: Danielle Clarke MRN: [id number] Date of Birth: November 03, 1941  [facility] CM/SW Contact:  Bridget Cordella Simmonds, LCSW Phone Number: 11/28/2024, 11:18 AM   Clinical Narrative:   Pt discharging to [facility], room 103p. RN call report to [phone].   PTAR called [phone].  0900: CSW confirmed with [facility]: they can receive pt today.   Final next level of care: Skilled Nursing Facility Barriers to Discharge: Barriers Resolved   Patient Goals and CMS Choice            Discharge Placement              Patient chooses bed at: [facility] Patient to be transferred to facility by: ptar Name of family member notified: daughter [name] in room Patient and family notified of of transfer: 11/28/24  Discharge Plan and Services Additional resources added to the After Visit Summary for                                       Social Drivers of Health (SDOH) Interventions SDOH Screenings   Food Insecurity: No Food Insecurity (11/24/2024)  Housing: Low Risk (11/24/2024)  Transportation Needs: No Transportation Needs (11/24/2024)  Utilities: Patient Unable To Answer (11/24/2024)  Social Connections: Unknown (11/24/2024)  Tobacco Use: Low Risk (11/24/2024)     Readmission Risk Interventions    04/23/2024   11:16 AM 04/02/2024   11:26 AM 02/07/2023   12:07 PM  Readmission Risk Prevention Plan  Post Dischage Appt   Complete  Medication Screening   Complete  Transportation Screening Complete Complete Complete  PCP or Specialist Appt within 5-7 Days Complete Complete   Home Care Screening Complete Complete   Medication Review (RN CM) Complete Referral to Pharmacy

## 2024-11-28 NOTE — Progress Notes (Signed)
 Physical Therapy Treatment Patient Details Name: Danielle Clarke MRN: [id number] DOB: 06/21/1941 Today's Date: 11/28/2024   History of Present Illness 84 y.o. female presents 11/23/24  after mechanical fall. X-ray right hip: Acute, mildly displaced right femoral neck fracture. S/p R hip hemiarthroplasty 12/29. PMH:  DM1, HTN, HLD, aortic stenosis, s/p TAVR, seizures, recurrent falls, multiple bone fractures in the past, hemorrhagic stroke in 2020, progressive weakness, generalized cachexia, under hospice care at home.    PT Comments  Pt received in supine, agreeable to limited therapy session at bed level as ambulance transport will be arriving to take her to SNF soon. Pt needing up to modA for rolling to L/R sides and transfer to long sitting. Pt c/o increased pain with R rolling, pt with good effort with BLE and UE to assist with all movements. Pt able to perform x5 bed crunches with transfer to long sitting and tolerates upright sitting in bed a couple mins multiple reps while donning upper body garments and removing tele stickers from her skin. Ambulance transport arriving after pt assisted to don briefs, pants and shirt so not able to assess EOB/OOB mobility.    If plan is discharge home, recommend the following: A lot of help with bathing/dressing/bathroom;Two people to help with walking and/or transfers;Assistance with cooking/housework;Help with stairs or ramp for entrance;Assist for transportation   Can travel by private vehicle     No  Equipment Recommendations  None recommended by PT    Recommendations for Other Services       Precautions / Restrictions Precautions Precautions: Fall Recall of Precautions/Restrictions: Impaired Restrictions Weight Bearing Restrictions Per Provider Order: Yes RLE Weight Bearing Per Provider Order: Weight bearing as tolerated     Mobility  Bed Mobility Overal bed mobility: Needs Assistance Bed Mobility: Rolling, Supine to Sit Rolling:  Min assist, Mod assist, Used rails   Supine to sit: Mod assist, HOB elevated, Used rails     General bed mobility comments: rolling L/R x3 reps ea side to remove wet bed pad and place dry towel under her along with donning disposable briefs prior to pants and shirt. Pt performs supine to long sitting x5 total reps while dresssing in bed. Pt defers EOB transfer after getting dressed due to fatigue and awaiting PTAR. Pt agreeable to use IS and have bed placed in chair posture.    Transfers Overall transfer level: Needs assistance                 General transfer comment: pt defers; upcoming DC    Ambulation/Gait                   Stairs             Wheelchair Mobility     Tilt Bed    Modified Rankin (Stroke Patients Only)       Balance Overall balance assessment: Needs assistance Sitting-balance support: Feet supported Sitting balance-Leahy Scale: Fair Sitting balance - Comments: tending to use BUE when long sitting in bed       Standing balance comment: pt defers today; fatigued after dressing                            Communication Communication Communication: No apparent difficulties  Cognition Arousal: Alert Behavior During Therapy: WFL for tasks assessed/performed   PT - Cognitive impairments: Problem solving  PT - Cognition Comments: Pt following simple motor commands well this date, daughter present and able to report she is participating/following instructions better today than in some prior days. Pt aware of plan to DC to rehab facility today. Following commands: Impaired Following commands impaired: Follows one step commands with increased time, Only follows one step commands consistently    Cueing Cueing Techniques: Verbal cues, Gestural cues  Exercises General Exercises - Lower Extremity Heel Slides: AAROM, Right, 5 reps, Supine, Both, AROM (AA on RLE) Other Exercises Other Exercises:  bed crunches x5 reps with UE support Other Exercises: IS x 5 reps pt achieves 150-300 mL    General Comments General comments (skin integrity, edema, etc.): no acute s/sx distress other than c/o pain, R hip dressing appears c/d/i, RN arrived to give insulin  at beginning of session due to elevated blood sugar.      Pertinent Vitals/Pain Pain Assessment Pain Assessment: Faces Faces Pain Scale: Hurts little more Pain Location: R hip with mobility Pain Descriptors / Indicators: Operative site guarding, Grimacing, Sore Pain Intervention(s): Limited activity within patient's tolerance, Monitored during session, Premedicated before session, Repositioned    Home Living                          Prior Function            PT Goals (current goals can now be found in the care plan section) Acute Rehab PT Goals Patient Stated Goal: to go home PT Goal Formulation: With patient Time For Goal Achievement: 12/09/24 Progress towards PT goals: Progressing toward goals    Frequency    Min 2X/week      PT Plan      Co-evaluation              AM-PAC PT 6 Clicks Mobility   Outcome Measure  Help needed turning from your back to your side while in a flat bed without using bedrails?: A Lot Help needed moving from lying on your back to sitting on the side of a flat bed without using bedrails?: A Lot Help needed moving to and from a bed to a chair (including a wheelchair)?: A Lot Help needed standing up from a chair using your arms (e.g., wheelchair or bedside chair)?: A Lot Help needed to walk in hospital room?: Total Help needed climbing 3-5 steps with a railing? : Total 6 Click Score: 10    End of Session   Activity Tolerance: Patient limited by fatigue Patient left: with call bell/phone within reach;in bed;with bed alarm set;with family/visitor present (daughter in room) Nurse Communication: Mobility status PT Visit Diagnosis: Pain;Unsteadiness on feet  (R26.81);History of falling (Z91.81);Muscle weakness (generalized) (M62.81);Difficulty in walking, not elsewhere classified (R26.2) Pain - Right/Left: Right Pain - part of body: Hip     Time: 8761-8741 PT Time Calculation (min) (ACUTE ONLY): 20 min  Charges:    $Therapeutic Activity: 8-22 mins PT General Charges $$ ACUTE PT VISIT: 1 Visit                     Shakeem Stern P., PTA Acute Rehabilitation Services Secure Chat Preferred 9a-5:30pm Office: [phone]    [facility] 11/28/2024, 1:09 PM

## 2024-11-28 NOTE — Care Management Important Message (Signed)
 Important Message  Patient Details  Name: Danielle Clarke MRN: [id number] Date of Birth: [birth year]   Important Message Given:  No     [name] 11/28/2024, 2:39 PM

## 2024-12-08 ENCOUNTER — Encounter (HOSPITAL_COMMUNITY): Payer: Self-pay

## 2024-12-08 ENCOUNTER — Emergency Department (HOSPITAL_COMMUNITY)

## 2024-12-08 ENCOUNTER — Inpatient Hospital Stay (HOSPITAL_COMMUNITY)
Admission: EM | Admit: 2024-12-08 | Discharge: 2024-12-14 | DRG: 189 | Disposition: A | Discharge status: Skilled Nursing Facility | Source: Ambulatory Visit | Attending: Internal Medicine | Admitting: Internal Medicine

## 2024-12-08 ENCOUNTER — Other Ambulatory Visit: Payer: Self-pay

## 2024-12-08 DIAGNOSIS — Z881 Allergy status to other antibiotic agents status: Secondary | ICD-10-CM

## 2024-12-08 DIAGNOSIS — Z79899 Other long term (current) drug therapy: Secondary | ICD-10-CM

## 2024-12-08 DIAGNOSIS — E109 Type 1 diabetes mellitus without complications: Secondary | ICD-10-CM | POA: Diagnosis present

## 2024-12-08 DIAGNOSIS — J9601 Acute respiratory failure with hypoxia: Principal | ICD-10-CM | POA: Diagnosis present

## 2024-12-08 DIAGNOSIS — J209 Acute bronchitis, unspecified: Secondary | ICD-10-CM | POA: Diagnosis present

## 2024-12-08 DIAGNOSIS — Z7982 Long term (current) use of aspirin: Secondary | ICD-10-CM | POA: Diagnosis not present

## 2024-12-08 DIAGNOSIS — Z1152 Encounter for screening for COVID-19: Secondary | ICD-10-CM

## 2024-12-08 DIAGNOSIS — Z886 Allergy status to analgesic agent status: Secondary | ICD-10-CM | POA: Diagnosis not present

## 2024-12-08 DIAGNOSIS — E10649 Type 1 diabetes mellitus with hypoglycemia without coma: Secondary | ICD-10-CM | POA: Diagnosis not present

## 2024-12-08 DIAGNOSIS — I129 Hypertensive chronic kidney disease with stage 1 through stage 4 chronic kidney disease, or unspecified chronic kidney disease: Secondary | ICD-10-CM | POA: Diagnosis present

## 2024-12-08 DIAGNOSIS — R569 Unspecified convulsions: Secondary | ICD-10-CM

## 2024-12-08 DIAGNOSIS — I10 Essential (primary) hypertension: Secondary | ICD-10-CM | POA: Diagnosis present

## 2024-12-08 DIAGNOSIS — R54 Age-related physical debility: Secondary | ICD-10-CM | POA: Diagnosis present

## 2024-12-08 DIAGNOSIS — E1022 Type 1 diabetes mellitus with diabetic chronic kidney disease: Secondary | ICD-10-CM | POA: Diagnosis present

## 2024-12-08 DIAGNOSIS — R918 Other nonspecific abnormal finding of lung field: Secondary | ICD-10-CM | POA: Diagnosis present

## 2024-12-08 DIAGNOSIS — Z8673 Personal history of transient ischemic attack (TIA), and cerebral infarction without residual deficits: Secondary | ICD-10-CM | POA: Diagnosis not present

## 2024-12-08 DIAGNOSIS — Z888 Allergy status to other drugs, medicaments and biological substances status: Secondary | ICD-10-CM | POA: Diagnosis not present

## 2024-12-08 DIAGNOSIS — Z794 Long term (current) use of insulin: Secondary | ICD-10-CM

## 2024-12-08 DIAGNOSIS — Z96641 Presence of right artificial hip joint: Secondary | ICD-10-CM | POA: Diagnosis present

## 2024-12-08 DIAGNOSIS — E1065 Type 1 diabetes mellitus with hyperglycemia: Secondary | ICD-10-CM | POA: Diagnosis present

## 2024-12-08 DIAGNOSIS — Z66 Do not resuscitate: Secondary | ICD-10-CM | POA: Diagnosis present

## 2024-12-08 DIAGNOSIS — Z961 Presence of intraocular lens: Secondary | ICD-10-CM | POA: Diagnosis present

## 2024-12-08 DIAGNOSIS — I2510 Atherosclerotic heart disease of native coronary artery without angina pectoris: Secondary | ICD-10-CM | POA: Diagnosis present

## 2024-12-08 DIAGNOSIS — N183 Chronic kidney disease, stage 3 unspecified: Secondary | ICD-10-CM | POA: Diagnosis present

## 2024-12-08 DIAGNOSIS — R809 Proteinuria, unspecified: Secondary | ICD-10-CM

## 2024-12-08 DIAGNOSIS — G40909 Epilepsy, unspecified, not intractable, without status epilepticus: Secondary | ICD-10-CM | POA: Diagnosis present

## 2024-12-08 DIAGNOSIS — E785 Hyperlipidemia, unspecified: Secondary | ICD-10-CM | POA: Diagnosis present

## 2024-12-08 DIAGNOSIS — E1029 Type 1 diabetes mellitus with other diabetic kidney complication: Secondary | ICD-10-CM

## 2024-12-08 DIAGNOSIS — N1831 Chronic kidney disease, stage 3a: Secondary | ICD-10-CM | POA: Diagnosis present

## 2024-12-08 DIAGNOSIS — Z952 Presence of prosthetic heart valve: Secondary | ICD-10-CM

## 2024-12-08 DIAGNOSIS — J189 Pneumonia, unspecified organism: Secondary | ICD-10-CM

## 2024-12-08 DIAGNOSIS — Z751 Person awaiting admission to adequate facility elsewhere: Secondary | ICD-10-CM

## 2024-12-08 LAB — RESP PANEL BY RT-PCR (RSV, FLU A&B, COVID)  RVPGX2
Influenza A by PCR: NEGATIVE
Influenza B by PCR: NEGATIVE
Resp Syncytial Virus by PCR: NEGATIVE
SARS Coronavirus 2 by RT PCR: NEGATIVE

## 2024-12-08 LAB — CBC
HCT: 32.6 % — ABNORMAL LOW (ref 36.0–46.0)
Hemoglobin: 10.5 g/dL — ABNORMAL LOW (ref 12.0–15.0)
MCH: 27 pg (ref 26.0–34.0)
MCHC: 32.2 g/dL (ref 30.0–36.0)
MCV: 83.8 fL (ref 80.0–100.0)
Platelets: 484 K/uL — ABNORMAL HIGH (ref 150–400)
RBC: 3.89 MIL/uL (ref 3.87–5.11)
RDW: 14.3 % (ref 11.5–15.5)
WBC: 18.3 K/uL — ABNORMAL HIGH (ref 4.0–10.5)
nRBC: 0 % (ref 0.0–0.2)

## 2024-12-08 LAB — BASIC METABOLIC PANEL WITH GFR
Anion gap: 9 (ref 5–15)
BUN: 29 mg/dL — ABNORMAL HIGH (ref 8–23)
CO2: 29 mmol/L (ref 22–32)
Calcium: 8.6 mg/dL — ABNORMAL LOW (ref 8.9–10.3)
Chloride: 95 mmol/L — ABNORMAL LOW (ref 98–111)
Creatinine, Ser: 0.68 mg/dL (ref 0.44–1.00)
GFR, Estimated: >60 mL/min
Glucose, Bld: 378 mg/dL — ABNORMAL HIGH (ref 70–99)
Potassium: 4.5 mmol/L (ref 3.5–5.1)
Sodium: 133 mmol/L — ABNORMAL LOW (ref 135–145)

## 2024-12-08 LAB — GLUCOSE, CAPILLARY
Glucose-Capillary: 337 mg/dL — ABNORMAL HIGH (ref 70–99)
Glucose-Capillary: 462 mg/dL — ABNORMAL HIGH (ref 70–99)

## 2024-12-08 LAB — PRO BRAIN NATRIURETIC PEPTIDE: Pro Brain Natriuretic Peptide: 1201 pg/mL — ABNORMAL HIGH

## 2024-12-08 LAB — TROPONIN T, HIGH SENSITIVITY
Troponin T High Sensitivity: 19 ng/L (ref 0–19)
Troponin T High Sensitivity: 28 ng/L — ABNORMAL HIGH (ref 0–19)

## 2024-12-08 MED ORDER — GUAIFENESIN-DM 100-10 MG/5ML PO SYRP
5.0000 mL | ORAL_SOLUTION | ORAL | Status: DC | PRN
  Administered 2024-12-08: 5 mL via ORAL
  Filled 2024-12-08: qty 5

## 2024-12-08 MED ORDER — ONDANSETRON HCL 4 MG PO TABS: 4.0000 mg | ORAL_TABLET | Freq: Four times a day (QID) | ORAL | Status: DC | PRN

## 2024-12-08 MED ORDER — SIMVASTATIN 20 MG PO TABS
40.0000 mg | ORAL_TABLET | Freq: Every day | ORAL | Status: DC
  Administered 2024-12-08 – 2024-12-13 (×4): 40 mg via ORAL
  Filled 2024-12-08 (×6): qty 2

## 2024-12-08 MED ORDER — OXYCODONE HCL 5 MG PO TABS
2.5000 mg | ORAL_TABLET | ORAL | Status: DC | PRN
  Administered 2024-12-12: 5 mg via ORAL
  Filled 2024-12-08: qty 1

## 2024-12-08 MED ORDER — INSULIN GLARGINE-YFGN 100 UNIT/ML ~~LOC~~ SOLN
6.0000 [IU] | Freq: Two times a day (BID) | SUBCUTANEOUS | Status: DC
  Administered 2024-12-08 – 2024-12-14 (×8): 6 [IU] via SUBCUTANEOUS
  Filled 2024-12-08 (×13): qty 0.06

## 2024-12-08 MED ORDER — ONDANSETRON HCL 4 MG/2ML IJ SOLN: 4.0000 mg | Freq: Four times a day (QID) | INTRAMUSCULAR | Status: DC | PRN

## 2024-12-08 MED ORDER — TRAZODONE HCL 50 MG PO TABS
25.0000 mg | ORAL_TABLET | Freq: Every evening | ORAL | Status: DC | PRN
  Filled 2024-12-08: qty 1

## 2024-12-08 MED ORDER — ALBUTEROL SULFATE (2.5 MG/3ML) 0.083% IN NEBU: 2.5000 mg | INHALATION_SOLUTION | RESPIRATORY_TRACT | Status: DC | PRN

## 2024-12-08 MED ORDER — INSULIN ASPART 100 UNIT/ML IJ SOLN
0.0000 [IU] | Freq: Three times a day (TID) | INTRAMUSCULAR | Status: DC
  Administered 2024-12-08: 7 [IU] via SUBCUTANEOUS

## 2024-12-08 MED ORDER — INSULIN ASPART 100 UNIT/ML IJ SOLN
0.0000 [IU] | Freq: Every day | INTRAMUSCULAR | Status: DC
  Administered 2024-12-09 – 2024-12-10 (×2): 3 [IU] via SUBCUTANEOUS
  Filled 2024-12-08 (×2): qty 3

## 2024-12-08 MED ORDER — SENNOSIDES-DOCUSATE SODIUM 8.6-50 MG PO TABS
1.0000 | ORAL_TABLET | Freq: Every day | ORAL | Status: DC
  Administered 2024-12-08 – 2024-12-11 (×3): 1 via ORAL
  Filled 2024-12-08 (×6): qty 1

## 2024-12-08 MED ORDER — INSULIN ASPART 100 UNIT/ML IJ SOLN
20.0000 [IU] | Freq: Once | INTRAMUSCULAR | Status: DC
  Filled 2024-12-08: qty 20

## 2024-12-08 MED ORDER — IPRATROPIUM-ALBUTEROL 0.5-2.5 (3) MG/3ML IN SOLN
3.0000 mL | Freq: Two times a day (BID) | RESPIRATORY_TRACT | Status: DC
  Administered 2024-12-08 – 2024-12-09 (×3): 3 mL via RESPIRATORY_TRACT
  Filled 2024-12-08 (×3): qty 3

## 2024-12-08 MED ORDER — SODIUM CHLORIDE 0.9 % IV SOLN
2.0000 g | INTRAVENOUS | Status: DC
  Administered 2024-12-08 – 2024-12-09 (×2): 2 g via INTRAVENOUS
  Filled 2024-12-08 (×2): qty 20

## 2024-12-08 MED ORDER — LEVOFLOXACIN IN D5W 750 MG/150ML IV SOLN: 750.0000 mg | INTRAVENOUS | Status: DC

## 2024-12-08 MED ORDER — INSULIN ASPART 100 UNIT/ML IJ SOLN
0.0000 [IU] | Freq: Three times a day (TID) | INTRAMUSCULAR | Status: DC
  Administered 2024-12-09 – 2024-12-11 (×6): 15 [IU] via SUBCUTANEOUS
  Administered 2024-12-11 – 2024-12-12 (×2): 11 [IU] via SUBCUTANEOUS
  Filled 2024-12-08: qty 11
  Filled 2024-12-08 (×5): qty 15
  Filled 2024-12-08: qty 11
  Filled 2024-12-08: qty 15

## 2024-12-08 MED ORDER — METOPROLOL TARTRATE 25 MG PO TABS
25.0000 mg | ORAL_TABLET | Freq: Two times a day (BID) | ORAL | Status: DC
  Administered 2024-12-08 – 2024-12-14 (×7): 25 mg via ORAL
  Filled 2024-12-08 (×12): qty 1

## 2024-12-08 MED ORDER — IOHEXOL 350 MG/ML SOLN
75.0000 mL | Freq: Once | INTRAVENOUS | Status: AC | PRN
  Administered 2024-12-08: 75 mL via INTRAVENOUS

## 2024-12-08 MED ORDER — INSULIN ASPART 100 UNIT/ML IJ SOLN
10.0000 [IU] | Freq: Once | INTRAMUSCULAR | Status: AC
  Administered 2024-12-09: 10 [IU] via SUBCUTANEOUS
  Filled 2024-12-08: qty 10

## 2024-12-08 MED ORDER — INSULIN ASPART 100 UNIT/ML IJ SOLN: 0.0000 [IU] | Freq: Three times a day (TID) | INTRAMUSCULAR | Status: DC

## 2024-12-08 MED ORDER — METHOCARBAMOL 500 MG PO TABS: 500.0000 mg | ORAL_TABLET | Freq: Three times a day (TID) | ORAL | Status: DC | PRN

## 2024-12-08 MED ORDER — MONTELUKAST SODIUM 10 MG PO TABS
10.0000 mg | ORAL_TABLET | Freq: Every day | ORAL | Status: DC
  Administered 2024-12-08 – 2024-12-13 (×3): 10 mg via ORAL
  Filled 2024-12-08 (×6): qty 1

## 2024-12-08 MED ORDER — ASPIRIN 81 MG PO TBEC
81.0000 mg | DELAYED_RELEASE_TABLET | Freq: Two times a day (BID) | ORAL | Status: DC
  Administered 2024-12-08 – 2024-12-14 (×11): 81 mg via ORAL
  Filled 2024-12-08 (×12): qty 1

## 2024-12-08 MED ORDER — SODIUM CHLORIDE 0.9 % IV SOLN
2.0000 g | Freq: Once | INTRAVENOUS | Status: AC
  Administered 2024-12-08: 2 g via INTRAVENOUS
  Filled 2024-12-08: qty 10

## 2024-12-08 MED ORDER — ACETAMINOPHEN 650 MG RE SUPP: 650.0000 mg | Freq: Four times a day (QID) | RECTAL | Status: DC | PRN

## 2024-12-08 MED ORDER — LEVETIRACETAM 250 MG PO TABS
250.0000 mg | ORAL_TABLET | Freq: Two times a day (BID) | ORAL | Status: DC
  Administered 2024-12-08 – 2024-12-14 (×10): 250 mg via ORAL
  Filled 2024-12-08 (×12): qty 1

## 2024-12-08 MED ORDER — INSULIN ASPART 100 UNIT/ML IJ SOLN
10.0000 [IU] | Freq: Once | INTRAMUSCULAR | Status: AC
  Administered 2024-12-08: 10 [IU] via SUBCUTANEOUS

## 2024-12-08 MED ORDER — ACETAMINOPHEN 325 MG PO TABS: 650.0000 mg | ORAL_TABLET | Freq: Four times a day (QID) | ORAL | Status: DC | PRN

## 2024-12-08 NOTE — ED Provider Notes (Signed)
 " [place] EMERGENCY DEPARTMENT AT [facility] Provider Note   CSN: [id number] Arrival date & time: 12/08/24  1127     Patient presents with: Shortness of Breath   Danielle Clarke is a 84 y.o. female.  With a history of type 1 diabetes, intracerebral hemorrhage pacemaker status post TAVR who presents to the ED for shortness of breath.  Patient was recently discharged from [facility] after falling and undergoing ORIF of right femoral neck fracture (Dr.Haddix)  on December 29.  She was discharged to rehab [facility]) on January 2.  D within the last 24 hours she developed productive coughing shortness of breath new oxygen requirement.  No chest pain nausea vomiting fevers chills GI symptoms.  No anticoagulation.      Shortness of Breath      Prior to Admission medications  Medication Sig Start Date End Date Taking? Authorizing Provider  acetaminophen  (TYLENOL ) 500 MG tablet Take 500 mg by mouth every 6 (six) hours as needed for moderate pain (pain score 4-6).    [provider]  aspirin  EC 81 MG tablet Take 1 tablet (81 mg total) by mouth 2 (two) times daily. Swallow whole. 11/26/24   Danton Lauraine LABOR, PA-C  docusate sodium  (COLACE) 100 MG capsule Take 1 capsule (100 mg total) by mouth 2 (two) times daily. 11/27/24   Rai, Nydia POUR, MD  insulin  lispro (HUMALOG) 100 UNIT/ML injection Inject 0-12 Units into the skin See admin instructions. Inject 0-12 units subcutaneously before meals and at bedtime per sliding scale: < 70 : contact provider 70-200 : 0 units 201-250 : 2 units 251-300 : 4 units 301-350 : 6 units 351-400 : 8 units 401-450 : 10 units 451-600 : 12 units - recheck blood sugar in 2 hours, if BS > 350 or < 100, notify provider.    [provider]  LANTUS  SOLOSTAR 100 UNIT/ML Solostar Pen Inject 6 Units into the skin 2 (two) times daily. 09/16/24   [provider]  levETIRAcetam  (KEPPRA ) 250 MG tablet Take 1 tablet (250 mg total) by mouth 2  (two) times daily. 04/25/24   Akula, Vijaya, MD  losartan  (COZAAR ) 25 MG tablet Take 12.5 mg by mouth at bedtime. 10/05/24   [provider]  methocarbamol  (ROBAXIN ) 500 MG tablet Take 1 tablet (500 mg total) by mouth every 8 (eight) hours as needed for muscle spasms. 11/26/24   Danton Lauraine LABOR, PA-C  metoprolol  tartrate (LOPRESSOR ) 25 MG tablet Take 1 tablet (25 mg total) by mouth 2 (two) times daily. 11/28/24   Rai, Nydia POUR, MD  montelukast  (SINGULAIR ) 10 MG tablet Take 10 mg by mouth at bedtime.    [provider]  oxyCODONE  (OXY IR/ROXICODONE ) 5 MG immediate release tablet Take 0.5-1 tablets (2.5-5 mg total) by mouth every 4 (four) hours as needed for moderate pain (pain score 4-6) or severe pain (pain score 7-10). 11/26/24   Danton Lauraine LABOR, PA-C  senna-docusate (SENOKOT-S) 8.6-50 MG tablet Take 1 tablet by mouth at bedtime. 11/27/24   Rai, Nydia POUR, MD  simvastatin  (ZOCOR ) 40 MG tablet Take 1 tablet (40 mg total) by mouth at bedtime. 11/27/24   Rai, Nydia POUR, MD  traZODone  (DESYREL ) 50 MG tablet Take 0.5 tablets (25 mg total) by mouth at bedtime as needed for sleep. 11/27/24   Rai, Nydia POUR, MD    Allergies: Augmentin [amoxicillin-pot clavulanate], Ceclor [cefaclor], Firvanq  [vancomycin ], Sumycin [tetracycline], Accupril  [quinapril  hcl], Bactrim  [sulfamethoxazole -trimethoprim ], Fosamax [alendronate sodium], Nsaids, and Deltasone [prednisone]  Review of Systems  Respiratory:  Positive for shortness of breath.     Updated Vital Signs BP (!) 121/43   Pulse 72   Temp 98.1 F (36.7 C) (Oral)   Resp 17   Ht 5' (1.524 m)   Wt 47 kg   SpO2 97%   BMI 20.24 kg/m   Physical Exam Vitals and nursing note reviewed.  HENT:     Head: Normocephalic and atraumatic.  Eyes:     Pupils: Pupils are equal, round, and reactive to light.  Cardiovascular:     Rate and Rhythm: Normal rate and regular rhythm.  Pulmonary:     Effort: Pulmonary effort is normal.     Breath sounds:  Examination of the right-lower field reveals rales. Rales present. No wheezing.  Abdominal:     Palpations: Abdomen is soft.     Tenderness: There is no abdominal tenderness.  Skin:    General: Skin is warm and dry.  Neurological:     Mental Status: She is alert.  Psychiatric:        Mood and Affect: Mood normal.     (all labs ordered are listed, but only abnormal results are displayed) Labs Reviewed  BASIC METABOLIC PANEL WITH GFR - Abnormal; Notable for the following components:      Result Value   Sodium 133 (*)    Chloride 95 (*)    Glucose, Bld 378 (*)    BUN 29 (*)    Calcium 8.6 (*)    All other components within normal limits  CBC - Abnormal; Notable for the following components:   WBC 18.3 (*)    Hemoglobin 10.5 (*)    HCT 32.6 (*)    Platelets 484 (*)    All other components within normal limits  PRO BRAIN NATRIURETIC PEPTIDE - Abnormal; Notable for the following components:   Pro Brain Natriuretic Peptide 1,201.0 (*)    All other components within normal limits  TROPONIN T, HIGH SENSITIVITY - Abnormal; Notable for the following components:   Troponin T High Sensitivity 28 (*)    All other components within normal limits  RESP PANEL BY RT-PCR (RSV, FLU A&B, COVID)  [id number]  CULTURE, BLOOD (ROUTINE X 2)  CULTURE, BLOOD (ROUTINE X 2)  TROPONIN T, HIGH SENSITIVITY    EKG: EKG Interpretation Date/Time:  Monday December 08 2024 11:43:42 EST Ventricular Rate:  78 PR Interval:  138 QRS Duration:  105 QT Interval:  425 QTC Calculation: 485 R Axis:   -9  Text Interpretation: Sinus rhythm LVH with secondary repolarization abnormality Anterior infarct, old Confirmed by Pamella Sharper [phone]) on 12/08/2024 1:07:49 PM  Radiology: CT Angio Chest PE W/Cm &/Or Wo Cm Result Date: 12/08/2024 EXAM: CTA of the Chest with contrast for PE 12/08/2024 01:37:31 PM TECHNIQUE: CTA of the chest was performed after the administration of 75 mL Omnipaque  350 intravenous contrast.  Multiplanar reformatted images are provided for review. MIP images are provided for review. Automated exposure control, iterative reconstruction, and/or weight based adjustment of the mA/kV was utilized to reduce the radiation dose to as low as reasonably achievable. COMPARISON: CT chest 04/15/2024. CLINICAL HISTORY: Pulmonary embolism (PE) suspected, high prob. FINDINGS: PULMONARY ARTERIES: Pulmonary arteries are adequately opacified for evaluation. No pulmonary embolism. Main pulmonary artery is normal in caliber. MEDIASTINUM: The heart and pericardium demonstrate no acute abnormality. Aortic valve replacement. There is no acute abnormality of the thoracic aorta. LYMPH NODES: No mediastinal, hilar or axillary lymphadenopathy. LUNGS AND PLEURA: In the  medial right upper lobe along the pleural surface there is enlargement soft tissue density measuring 2.4 x 2.0 cm (image 17 of series 7). This is essentially new from comparison CT 04/15/2024. No focal consolidation or pulmonary edema. No pleural effusion or pneumothorax. UPPER ABDOMEN: Hypoperfusion to the central spleen measuring 5.2 x 3.8 cm. SOFT TISSUES AND BONES: There are several healed posterior rib fractures on the left. Multiple levels of degenerative change and scoliosis in the upper thoracic spine. Anterior cervical spine fusion. No acute soft tissue abnormality. IMPRESSION: 1. No pulmonary embolism. 2. New 2.4 x 2.0 cm pleural-based right upper lobe mass, recommend PET/CT and/or tissue sampling. 3. Splenic perfusion defect. Indeterminate finding. Recommended additional PET scan if obtained. 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 LUNGS AND PLEURA: No focal  pulmonary opacity. No pleural effusion. No pneumothorax. HEART AND MEDIASTINUM: Aortic calcification. Aortic valve prosthesis. No acute abnormality of the cardiac and mediastinal silhouettes. BONES AND SOFT TISSUES: Right shoulder arthroplasty changes. Old healed fracture deformity of left proximal humerus. Levoscoliosis of thoracolumbar spine. Chronic right posterior rib fracture deformities. IMPRESSION: 1. No acute findings. Electronically signed by: Waddell Calk MD MD 12/08/2024 02:08 PM EST RP Workstation: [id number]     .Critical Care  Performed by: Pamella Ozell LABOR, DO Authorized by: Pamella Ozell LABOR, DO   Critical care provider statement:    Critical care time (minutes):  30   Critical care was necessary to treat or prevent imminent or life-threatening deterioration of the following conditions:  Respiratory failure   Critical care was time spent personally by me on the following activities:  Development of treatment plan with patient or surrogate, discussions with consultants, evaluation of patient's response to treatment, examination of patient, ordering and review of laboratory studies, ordering and review of radiographic studies, ordering and performing treatments and interventions, pulse oximetry, re-evaluation of patient's condition, review of old charts and obtaining history from patient or surrogate   I assumed direction of critical care for this patient from another provider in my specialty: no     Care discussed with: admitting provider      Medications Ordered in the ED  aztreonam  (AZACTAM ) 2 g in sodium chloride  0.9 % 100 mL IVPB (0 g Intravenous Stopped 12/08/24 1425)  iohexol  (OMNIPAQUE ) 350 MG/ML injection 75 mL (75 mLs Intravenous Contrast Given 12/08/24 1318)    Clinical Course as of 12/08/24 1540  Mon Dec 08, 2024  1538 Patient remained stable on 2 L nasal cannula.  Labs consistent with leukocytosis.  BNP slightly elevated.  COVID flu RSV all negative.  Chest x-ray shows  no acute findings CTA chest shows no PE but does show concern for new right upper lobe mass.  Discussed these findings with the patient and her daughter at bedside.  She will require admission given new O2 requirement and will need further evaluation of right upper lobe mass.  Discussed with Dr. Celinda (hospitalist) who accepts patient for admission. [MP]    Clinical Course User Index [MP] Pamella Ozell LABOR, DO                                 Medical Decision Making 84 year old  female with history as above presents to the ED for cough congestion shortness of breath.  Hypoxemic in the 80s.  No home O2.  Recovering at rehab after right femoral fracture status post ORIF.  Presentation concerning for pneumonia as she does have a history of aspiration pneumonia.  Will cover for hospital-acquired pneumonia with aztreonam .  (Multiple antibiotic allergies).  Will need to rule out PE especially because she is not anticoagulated in the setting of recent long bone fracture and immobilization.  Will obtain CTA chest.  Will need admission given new O2 requirement.  Amount and/or Complexity of Data Reviewed Labs: ordered. Radiology: ordered.  Risk Decision regarding hospitalization.        Final diagnoses:  Acute hypoxemic respiratory failure [facility])  Mass of upper lobe of right lung    ED Discharge Orders     None          Pamella Ozell LABOR, DO 12/08/24 1540  "

## 2024-12-08 NOTE — ED Provider Triage Note (Signed)
 Emergency Medicine Provider Triage Evaluation Note  Danielle Clarke , a 84 y.o. female  was evaluated in triage.  Pt complains of shob.  Has been going on for 2 days.  Also reports cough but unsure if she has had a fever.  Unsure if she is having chest pain.  Does report recent admission for hip fracture.  Denies oxygen.  Review of Systems  Positive: See above Negative: See above  Physical Exam  BP (!) 112/49 (BP Location: Right Arm)   Pulse 75   Temp 98.1 F (36.7 C) (Oral)   Resp 16   Ht 5' (1.524 m)   Wt 47 kg   SpO2 (!) 86%   BMI 20.24 kg/m  Gen:   Awake, no distress   Resp:  Normal effort  MSK:   Moves extremities without difficult Other:    Medical Decision Making  Medically screening exam initiated at 12:08 PM.  Appropriate orders placed.  Danielle Clarke was informed that the remainder of the evaluation will be completed by another provider, this initial triage assessment does not replace that evaluation, and the importance of remaining in the ED until their evaluation is complete.  Work up started   Danielle [name] POUR, PA-C 12/08/24 1209

## 2024-12-08 NOTE — H&P (Addendum)
 " History and Physical    Patient: Danielle Clarke FMW:[id number] DOB: [birth year] DOA: 12/08/2024 DOS: the patient was seen and examined on 12/08/2024 PCP: [name], MD  Patient coming from: Home  Chief Complaint:  Chief Complaint  Patient presents with   Shortness of Breath   HPI: Danielle Clarke is a 84 y.o. female with medical history significant of mild aortic stenosis, status post TAVR, osteoarthritis, asthmatic bronchitis with URIs, coronary artery disease, cataracts, glaucoma, history of heart murmur, hyperlipidemia, hypertension, history of neck fracture, osteoporosis, type I DM, stage III CKD, left humeral fracture, T12 compression fracture,  prolonged QT interval, history intracranial bleeding, history of TBI, seizure disorder who was brought from her facility via EMS due to dyspnea and hypoxia of 88% at her facility..  She has been having cough with the sputum progressing to take and cloudy.  She denied fever, chills, rhinorrhea, sore throat, wheezing or hemoptysis.  No chest pain, palpitations, diaphoresis, PND, orthopnea or pitting edema of the lower extremities.  Her appetite is decreased, but no abdominal pain, nausea, emesis, diarrhea, constipation, melena or hematochezia.  No flank pain, dysuria, frequency or hematuria.  No polyuria, polydipsia, polyphagia or blurred vision.   Lab work: CBC showed a white count of 18.3, hemoglobin 10.5 g/dL platelets 515.  proBNP was 12,001.0 pg/mL.  Troponin was 19 and then 28 ng/L.  Negative coronavirus, influenza and RSV PCR test.  CMP showed a glucose of 378, BUN 29, creatinine 0.68 and calcium 8.6 mg/dL.  The rest of the electrolytes were normal after sodium/chloride correction.  Imaging: 2 view chest radiograph showing no acute findings.  CTA chest with no PE but there is a new 2.4 x 2.0 cm pleural-based right upper lobe mass, recommend PET/CT and/or tissue sampling.  Splenic perfusion defect.  Indeterminate finding recommend  additional PET scan if obtained.   ED course: Initial vital signs were temperature 98.1 F, pulse 75, respirations 16, BP 112/49 mmHg and O2 sat 86% on room air.  The patient was placed on nasal cannula oxygen at 2 LPM and then was given 2 g of aztreonam .  Review of Systems: As mentioned in the history of present illness. All other systems reviewed and are negative. Past Medical History:  Diagnosis Date   Aortic stenosis, mild    Arthritis    Asthmatic bronchitis    with colds per patient   Carotid artery disease    40-59% bilateral ICA stenosis   Cataract    Cutaneous abscess of right foot    Glaucoma    Heart murmur    Dr Claudene is her  cardiologist.    Hyperlipemia    Hypertension    Dr. 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 Wendel and Dr. Maryjane   Syncope    Type 1 diabetes mellitus [facility])    Past Surgical History:  Procedure Laterality Date   ANKLE FRACTURE SURGERY Left 2001   steel plate and 3 screws    ANTERIOR APPROACH HEMI HIP ARTHROPLASTY Right 11/24/2024   Procedure: HEMIARTHROPLASTY, HIP, DIRECT ANTERIOR APPROACH, FOR FRACTURE;  Surgeon: [name] Franky SQUIBB, MD;  Location: [facility] OR;  Service: Orthopedics;  Laterality: Right;   ANTERIOR CERVICAL CORPECTOMY N/A 07/31/2014   Procedure: Cervical Four to Cervical Six Corpectomy;  Surgeon: Catalina CHRISTELLA Stains, MD;  Location: [facility]  NEURO ORS;  Service: Neurosurgery;  Laterality: N/A;  C4 to C6 Corpectomy   BREAST SURGERY  1988   CATARACT EXTRACTION W/ INTRAOCULAR LENS  IMPLANT, BILATERAL Bilateral 2011   right and then left   EYE SURGERY     FRACTURE SURGERY     HAMMER TOE SURGERY  1998   I & D EXTREMITY Right 09/27/2018   Procedure: IRRIGATION AND DEBRIDEMENT RIGHT FOOT;  Surgeon: Harden Jerona GAILS, MD;  Location: [facility] OR;  Service: Orthopedics;  Laterality: Right;   INSERT / REPLACE / REMOVE PACEMAKER      INTRAOPERATIVE TRANSTHORACIC ECHOCARDIOGRAM N/A 02/06/2023   Procedure: INTRAOPERATIVE TRANSTHORACIC ECHOCARDIOGRAM;  Surgeon: Wendel Lurena POUR, MD;  Location: [facility] OR;  Service: Open Heart Surgery;  Laterality: N/A;   IRRIGATION AND DEBRIDEMENT ELBOW Right 11/24/2024   Procedure: IRRIGATION AND DEBRIDEMENT ELBOW WITH WOUND CLOSURE;  Surgeon: [name] Franky SQUIBB, MD;  Location: [facility] OR;  Service: Orthopedics;  Laterality: Right;   JOINT REPLACEMENT     PACEMAKER IMPLANT N/A 02/10/2019   Procedure: PACEMAKER IMPLANT;  Surgeon: Waddell Danelle ORN, MD;  Location: [facility] INVASIVE CV LAB;  Service: Cardiovascular;  Laterality: N/A;   RIGHT/LEFT HEART CATH AND CORONARY ANGIOGRAPHY N/A 01/11/2023   Procedure: RIGHT/LEFT HEART CATH AND CORONARY ANGIOGRAPHY;  Surgeon: Wendel Lurena POUR, MD;  Location: [facility] INVASIVE CV LAB;  Service: Cardiovascular;  Laterality: N/A;   TONSILLECTOMY AND ADENOIDECTOMY  1948   TOTAL SHOULDER REPLACEMENT  2010   right shoulder    TRANSCATHETER AORTIC VALVE REPLACEMENT, TRANSFEMORAL N/A 02/06/2023   Procedure: Transcatheter Aortic Valve Replacement, Transfemoral;  Surgeon: Thukkani, Arun K, MD;  Location: [facility] OR;  Service: Open Heart Surgery;  Laterality: N/A;   TUBAL LIGATION  1979   TYMPANOSTOMY TUBE PLACEMENT Bilateral    Social History:  reports that she has never smoked. She has never used smokeless tobacco. She reports current alcohol use of about 2.0 standard drinks of alcohol per week. She reports that she does not use drugs.  Allergies[1]  Family History  Problem Relation Age of Onset   Cancer Mother    Heart Problems Father     Prior to Admission medications  Medication Sig Start Date End Date Taking? Authorizing Provider  acetaminophen  (TYLENOL ) 500 MG tablet Take 500 mg by mouth every 6 (six) hours as needed for moderate pain (pain score 4-6).    [provider]  aspirin  EC 81 MG tablet Take 1 tablet (81 mg total) by mouth 2 (two) times daily. Swallow whole.  11/26/24   Danton Lauraine LABOR, PA-C  docusate sodium  (COLACE) 100 MG capsule Take 1 capsule (100 mg total) by mouth 2 (two) times daily. 11/27/24   Rai, Nydia POUR, MD  insulin  lispro (HUMALOG) 100 UNIT/ML injection Inject 0-12 Units into the skin See admin instructions. Inject 0-12 units subcutaneously before meals and at bedtime per sliding scale: < 70 : contact provider 70-200 : 0 units 201-250 : 2 units 251-300 : 4 units 301-350 : 6 units 351-400 : 8 units 401-450 : 10 units 451-600 : 12 units - recheck blood sugar in 2 hours, if BS > 350 or < 100, notify provider.    [provider]  LANTUS  SOLOSTAR 100 UNIT/ML Solostar Pen Inject 6 Units into the skin 2 (two) times daily. 09/16/24   [provider]  levETIRAcetam  (KEPPRA ) 250 MG tablet Take 1 tablet (250 mg total) by mouth 2 (two) times daily. 04/25/24   Akula, Vijaya, MD  losartan  (COZAAR ) 25 MG tablet  Take 12.5 mg by mouth at bedtime. 10/05/24   [provider]  methocarbamol  (ROBAXIN ) 500 MG tablet Take 1 tablet (500 mg total) by mouth every 8 (eight) hours as needed for muscle spasms. 11/26/24   Danton Lauraine LABOR, PA-C  metoprolol  tartrate (LOPRESSOR ) 25 MG tablet Take 1 tablet (25 mg total) by mouth 2 (two) times daily. 11/28/24   Rai, Nydia POUR, MD  montelukast  (SINGULAIR ) 10 MG tablet Take 10 mg by mouth at bedtime.    [provider]  oxyCODONE  (OXY IR/ROXICODONE ) 5 MG immediate release tablet Take 0.5-1 tablets (2.5-5 mg total) by mouth every 4 (four) hours as needed for moderate pain (pain score 4-6) or severe pain (pain score 7-10). 11/26/24   Danton Lauraine LABOR, PA-C  senna-docusate (SENOKOT-S) 8.6-50 MG tablet Take 1 tablet by mouth at bedtime. 11/27/24   Rai, Nydia POUR, MD  simvastatin  (ZOCOR ) 40 MG tablet Take 1 tablet (40 mg total) by mouth at bedtime. 11/27/24   Rai, Nydia POUR, MD  traZODone  (DESYREL ) 50 MG tablet Take 0.5 tablets (25 mg total) by mouth at bedtime as needed for sleep. 11/27/24   Davia Nydia POUR, MD    Physical Exam: Vitals:   12/08/24 1200 12/08/24 1315 12/08/24 1400 12/08/24 1500  BP: (!) 122/55 (!) 118/47 116/64 (!) 121/43  Pulse: 76  75 72  Resp: 19 16 15 17   Temp:      TempSrc:      SpO2: 100%  99% 97%  Weight:      Height:       Physical Exam Vitals and nursing note reviewed.  Constitutional:      General: She is awake. She is not in acute distress.    Appearance: She is ill-appearing.     Interventions: Nasal cannula in place.  HENT:     Head: Normocephalic.     Nose: No rhinorrhea.     Mouth/Throat:     Mouth: Mucous membranes are dry.  Eyes:     General: No scleral icterus.    Pupils: Pupils are equal, round, and reactive to light.  Neck:     Vascular: Carotid bruit present. No JVD.  Cardiovascular:     Rate and Rhythm: Normal rate and regular rhythm.     Heart sounds: S1 normal and S2 normal.  Pulmonary:     Effort: Pulmonary effort is normal.     Breath sounds: Examination of the right-upper field reveals rales. Rhonchi and rales present. No decreased breath sounds or wheezing.  Abdominal:     General: Bowel sounds are normal. There is no distension.     Palpations: Abdomen is soft.     Tenderness: There is no abdominal tenderness. There is no right CVA tenderness or left CVA tenderness.  Musculoskeletal:     Cervical back: Neck supple. No tenderness.     Right lower leg: No edema.     Left lower leg: No edema.  Skin:    General: Skin is warm and dry.  Neurological:     General: No focal deficit present.     Mental Status: She is alert and oriented to person, place, and time.  Psychiatric:        Mood and Affect: Mood normal.        Behavior: Behavior normal. Behavior is cooperative.     Data Reviewed:  Results are pending, will review when available.  02/15/2024 echocardiogram report.  IMPRESSIONS:   1. Left ventricular ejection fraction, by estimation, is  50 to 55%. The  left ventricle has low normal function. The left  ventricle has no regional  wall motion abnormalities. Left ventricular diastolic parameters are  consistent with Grade I diastolic  dysfunction (impaired relaxation). The average left ventricular global  longitudinal strain is -19.2 %. The global longitudinal strain is normal.   2. Right ventricular systolic function is normal. The right ventricular  size is normal. There is mildly elevated pulmonary artery systolic  pressure. The estimated right ventricular systolic pressure is 42.4 mmHg.   3. The mitral valve is normal in structure. Mild mitral valve  regurgitation. No evidence of mitral stenosis. Severe mitral annular  calcification.   4. Tricuspid valve regurgitation is moderate.   5. DI 0.32. The aortic valve has been repaired/replaced. Aortic valve  regurgitation is not visualized. No aortic stenosis is present. There is a  20 mm Sapien prosthetic (TAVR) valve present in the aortic position. Echo  findings are consistent with  increased gradient of the aortic prosthesis. Aortic valve area, by VTI  measures 1.33 cm. Aortic valve mean gradient measures 26.0 mmHg. Aortic  valve Vmax measures 3.21 m/s.   6. The inferior vena cava is normal in size with greater than 50%  respiratory variability, suggesting right atrial pressure of 3 mmHg.   Comparison(s): Prior 03/16/23 - TAVR mean gradient was 13 mmHg. Now 26  mmHg. 11/2022 ECHO prior to TAVR with mean gradient 36 mmHg. Consider CTA  to evaluate for HALT (hypoattenuated leaflet thickening).   EKG: Vent. rate 78 BPM PR interval 138 ms QRS duration 105 ms QT/QTcB 425/485 ms P-R-T axes 79 -9 82 Sinus rhythm LVH with secondary repolarization abnormality Anterior infarct, old  Assessment and Plan: Principal Problem:   Acute respiratory failure with hypoxia (HCC) In the setting of:   Mass of upper lobe of the right lung Has tolerated ceftriaxone  before. Admit to PCU/inpatient. Continue supplemental oxygen. Scheduled and as  needed bronchodilators. Begin ceftriaxone  1 g IVPB daily. Begin azithromycin  500 mg IVPB daily. Check strep pneumoniae urinary antigen. Check sputum Gram stain, culture and sensitivity. Follow-up blood culture and sensitivity. Follow-up CBC and chemistry in the morning. Will consult IR in a.m. for possible tissue sampling.  Active Problems:   Diabetes mellitus type 1 (HCC) Carbohydrate modified diet. Continue Lantus  60 units SQ twice daily. CBG monitoring with RI SS.    Essential hypertension, benign Continue metoprolol  tartrate 25 mg p.o. twice daily. Continue losartan  pending med rec.    CKD (chronic kidney disease), stage III (HCC) Monitor renal function and electrolytes.    Dyslipidemia Continue simvastatin  40 mg p.o. daily.    Aortic stenosis   S/P TAVR (transcatheter aortic valve replacement) Follow-up with cardiology as an outpatient.    Seizures (HCC) Continue Keppra  250 mg p.o. twice daily.     Advance Care Planning:   Code Status: Limited: Do not attempt resuscitation (DNR) -DNR-LIMITED -Do Not Intubate/DNI    Consults:   Family Communication: Her daughter was at bedside.  Severity of Illness: The appropriate patient status for this patient is INPATIENT. Inpatient status is judged to be reasonable and necessary in order to provide the required intensity of service to ensure the patient's safety. The patient's presenting symptoms, physical exam findings, and initial radiographic and laboratory data in the context of their chronic comorbidities is felt to place them at high risk for further clinical deterioration. Furthermore, it is not anticipated that the patient will be medically stable for discharge from the hospital within 2 midnights  of admission.   * I certify that at the point of admission it is my clinical judgment that the patient will require inpatient hospital care spanning beyond 2 midnights from the point of admission due to high intensity of service,  high risk for further deterioration and high frequency of surveillance required.*  Author: Alm Dorn Castor, MD 12/08/2024 3:33 PM  For on call review [email or web address].   This document was prepared using Dragon voice recognition software and may contain some unintended transcription errors.     [1]  Allergies Allergen Reactions   Augmentin [Amoxicillin-Pot Clavulanate] Anaphylaxis   Ceclor [Cefaclor] Anaphylaxis    Tolerated cephalexin  in 2018   Firvanq  [Vancomycin ] Anaphylaxis   Sumycin [Tetracycline] Anaphylaxis   Accupril  [Quinapril  Hcl] Other (See Comments)    Hyperkalemia   Bactrim  [Sulfamethoxazole -Trimethoprim ] Other (See Comments)    CKD - decreased kidney function   Fosamax [Alendronate Sodium] Other (See Comments)    Unknown reaction   Nsaids Other (See Comments)    CKD   Deltasone [Prednisone] Other (See Comments)    Unknown reaction   "

## 2024-12-08 NOTE — ED Triage Notes (Addendum)
 Patient BIB [facility] from [facility]. When the nurse made rounds today, the oxygen was 88% room air. Patient usually does not wear oxygen. Patient said she feels short of breath. Has a cough and said the mucus looks cloudy. Patient was 86% room air during triage. Applied 4L [place] and patient improved to 92%. Denies chest pain.

## 2024-12-08 NOTE — Inpatient Diabetes Management (Signed)
 Inpatient Diabetes Program Recommendations  AACE/ADA: New Consensus Statement on Inpatient Glycemic Control  Target Ranges:  Prepandial:   less than 140 mg/dL      Peak postprandial:   less than 180 mg/dL (1-2 hours)      Critically ill patients:  140 - 180 mg/dL    Latest Reference Range & Units 12/08/24 12:18  CO2 22 - 32 mmol/L 29  Glucose 70 - 99 mg/dL 621 (H)  Anion gap 5 - 15  9   Review of Glycemic Control  Diabetes history: DM1 Outpatient Diabetes medications: Lantus  6 units BID, Humalog 0-12 units AC&HS Current orders for Inpatient glycemic control: None  Inpatient Diabetes Program Recommendations:    Insulin : If patient is admitted, please consider ordering insulin  glargine 5 units BID, CBGs AC&HS, and Novolog  0-6 units AC&HS.  NOTE: Patient currently in ED from rehab with hypoxia, shortness of breath, and cough. Patient was recently inpatient 11/23/24-11/28/24 and seen by inpatient diabetes coordinator on 11/25/24.   Thanks, Earnie Gainer, RN, MSN, CDCES Diabetes Coordinator Inpatient Diabetes Program [phone] (Team Pager from 8am to 5pm)

## 2024-12-09 DIAGNOSIS — J9601 Acute respiratory failure with hypoxia: Secondary | ICD-10-CM | POA: Diagnosis not present

## 2024-12-09 LAB — CBC
HCT: 27.5 % — ABNORMAL LOW (ref 36.0–46.0)
Hemoglobin: 9.2 g/dL — ABNORMAL LOW (ref 12.0–15.0)
MCH: 27.5 pg (ref 26.0–34.0)
MCHC: 33.5 g/dL (ref 30.0–36.0)
MCV: 82.3 fL (ref 80.0–100.0)
Platelets: 413 K/uL — ABNORMAL HIGH (ref 150–400)
RBC: 3.34 MIL/uL — ABNORMAL LOW (ref 3.87–5.11)
RDW: 14.3 % (ref 11.5–15.5)
WBC: 13.5 K/uL — ABNORMAL HIGH (ref 4.0–10.5)
nRBC: 0 % (ref 0.0–0.2)

## 2024-12-09 LAB — COMPREHENSIVE METABOLIC PANEL WITH GFR
ALT: 6 U/L (ref 0–44)
AST: 13 U/L — ABNORMAL LOW (ref 15–41)
Albumin: 2.8 g/dL — ABNORMAL LOW (ref 3.5–5.0)
Alkaline Phosphatase: 113 U/L (ref 38–126)
Anion gap: 8 (ref 5–15)
BUN: 23 mg/dL (ref 8–23)
CO2: 30 mmol/L (ref 22–32)
Calcium: 8.7 mg/dL — ABNORMAL LOW (ref 8.9–10.3)
Chloride: 98 mmol/L (ref 98–111)
Creatinine, Ser: 0.58 mg/dL (ref 0.44–1.00)
GFR, Estimated: >60 mL/min
Glucose, Bld: 57 mg/dL — ABNORMAL LOW (ref 70–99)
Potassium: 3.6 mmol/L (ref 3.5–5.1)
Sodium: 136 mmol/L (ref 135–145)
Total Bilirubin: <0.2 mg/dL (ref 0.0–1.2)
Total Protein: 6.5 g/dL (ref 6.5–8.1)

## 2024-12-09 LAB — STREP PNEUMONIAE URINARY ANTIGEN: Strep Pneumo Urinary Antigen: NEGATIVE

## 2024-12-09 LAB — GLUCOSE, CAPILLARY
Glucose-Capillary: 58 mg/dL — ABNORMAL LOW (ref 70–99)
Glucose-Capillary: 158 mg/dL — ABNORMAL HIGH (ref 70–99)
Glucose-Capillary: 387 mg/dL — ABNORMAL HIGH (ref 70–99)
Glucose-Capillary: 384 mg/dL — ABNORMAL HIGH (ref 70–99)
Glucose-Capillary: 264 mg/dL — ABNORMAL HIGH (ref 70–99)

## 2024-12-09 MED ORDER — ENSURE PLUS HIGH PROTEIN PO LIQD
237.0000 mL | Freq: Two times a day (BID) | ORAL | Status: DC
  Administered 2024-12-09 – 2024-12-14 (×9): 237 mL via ORAL

## 2024-12-09 NOTE — Plan of Care (Signed)

## 2024-12-09 NOTE — Plan of Care (Signed)
   Problem: Education: Goal: Knowledge of General Education information will improve Description Including pain rating scale, medication(s)/side effects and non-pharmacologic comfort measures Outcome: Progressing   Problem: Health Behavior/Discharge Planning: Goal: Ability to manage health-related needs will improve Outcome: Progressing

## 2024-12-09 NOTE — Progress Notes (Signed)
 " PROGRESS NOTE    Danielle Clarke  FMW:[id number] DOB: [birth year] DOA: 12/08/2024 PCP: [name], MD    Brief Narrative:  84 year old with history of aortic stenosis status post TAVR, osteoarthritis, asthmatic bronchitis, coronary artery disease, hypertension hyperlipidemia, type 1 diabetes on insulin , CKD stage IIIa, multiple compression fractures, history of intracranial bleed and seizure disorder who was recently admitted to the hospital on 12/28 with traumatic right hip fracture underwent ORIF and discharged to SNF on 1/2.  Facility called EMS due to dyspnea and hypoxemia with 88%  at her facility.  No other symptoms of infection. In the emergency room WBC count 18.3, proBNP 12,000.  Troponin 19.  COVID, flu and RSV negative.  Blood glucose 378. Chest radiograph without any acute findings.  CT angiogram of the chest with no PE, no evidence of pneumonia.  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  Admitted due to significant symptoms.  She was 86% on room air and needed 2 L of oxygen.  Subjective: Patient seen and examined.  Poor historian.  She has some dry cough.  Patient denies any chest pain or shortness of breath.  Patient is very poor historian.  She tells me that she was recently in the hospital with right hip fracture and has difficulty mobilizing. Called and discussed with patient's daughter over the phone, see details as below. Had a hypoglycemic episode today morning corrected with oral intake.   Assessment & Plan:   Acute hypoxemia, suspected acute bronchitis with wheezing: Agree with monitoring due to ongoing symptoms agree with admission to monitored unit because of severity of symptoms. Patient will be started on bronchodilator therapy.  Currently no indication for steroids. deep breathing exercises, incentive spirometry, chest physiotherapy  Antibiotics due to severity of symptoms.  Will treat with 7 days of antibiotics.  Keep on  Rocephin . Supplemental oxygen to keep saturations more than 90%. Physical therapy and Occupational Therapy today.  Right upper lobe pleural-based tumor: Not likely causing acute hypoxemia. Goal of care discussion, patient is very frail and debilitated.  She does not want to have surgery or she is not a candidate for chemotherapy or other treatment even if this is cancer.  Discussed with her daughter in details.  Monitoring but do not anticipate any biopsy. I will send referral to pulmonary tumor clinic for follow-up however do not anticipate surgery at procedure.  Type 1 diabetes with hyperglycemia, hypoglycemic episode today morning. At home on Lantus  6 units twice daily.  Patient was given additional 20 units of insulin  for elevated blood sugar overnight causing hypoglycemia. Skip morning dose of insulin .  Allow regular diet.  Continue Lantus  6 units twice daily and sliding scale today.  Will monitor closely.  Essential hypertension: Blood pressure is stable on metoprolol  25 mg twice daily.  History of TAVR: Stable.  Seizure disorder: On Keppra  250 mg twice daily.  No evidence of new seizure.  Right hip fracture: ORIF 12/29. Weightbearing as tolerated.  Looks stable.  Start PT OT.  Refer back to a SNF. Aspirin  81 mg twice daily for DVT prophylaxis. Adequate pain medications with a bowel regimen.      DVT prophylaxis: SCDs Start: 12/08/24 1740   Code Status: DNR/DNI Family Communication: Patient's daughter called and updated on the phone Disposition Plan: Status is: Inpatient Remains inpatient appropriate because: Significant symptoms, hypoglycemia     Consultants:  None  Procedures:  None  Antimicrobials:  Rocephin  1/12---  Objective: Vitals:   12/09/24 0159 12/09/24 0527 12/09/24 0700 12/09/24 1116  BP: (!) 120/44 (!) 126/44  (!) 117/41  Pulse: 73 79  63  Resp:  15    Temp:  99 F (37.2 C)    TempSrc:  Oral    SpO2: 97% 94% 94%   Weight:      Height:         Intake/Output Summary (Last 24 hours) at 12/09/2024 1128 Last data filed at 12/09/2024 0600 Gross per 24 hour  Intake 460 ml  Output 200 ml  Net 260 ml   Filed Weights   12/08/24 1143  Weight: 47 kg    Examination:  General exam: Appears calm and comfortable  Frail and debilitated.  She is on room air today.  Not in any distress. Respiratory system: Clear to auscultation. Respiratory effort normal.  No added sounds.  On room air. Cardiovascular system: S1 & S2 heard, RRR. No JVD, murmurs, rubs, gallops or clicks. No pedal edema. Gastrointestinal system: Abdomen is nondistended, soft and nontender. No organomegaly or masses felt. Normal bowel sounds heard. Central nervous system: Alert and oriented.  Gross generalized weakness.  Forgetful. Right anterior hip incision clean and dry.     Data Reviewed: I have personally reviewed following labs and imaging studies  CBC: Recent Labs  Lab 12/08/24 1218 12/09/24 0606  WBC 18.3* 13.5*  HGB 10.5* 9.2*  HCT 32.6* 27.5*  MCV 83.8 82.3  PLT 484* 413*   Basic Metabolic Panel: Recent Labs  Lab 12/08/24 1218 12/09/24 0606  NA 133* 136  K 4.5 3.6  CL 95* 98  CO2 29 30  GLUCOSE 378* 57*  BUN 29* 23  CREATININE 0.68 0.58  CALCIUM 8.6* 8.7*   GFR: Estimated Creatinine Clearance: 38.9 mL/min (by C-G formula based on SCr of 0.58 mg/dL). Liver Function Tests: Recent Labs  Lab 12/09/24 0606  AST 13*  ALT 6  ALKPHOS 113  BILITOT <0.2  PROT 6.5  ALBUMIN  2.8*   No results for input(s): LIPASE, AMYLASE in the last 168 hours. No results for input(s): AMMONIA in the last 168 hours. Coagulation Profile: No results for input(s): INR, PROTIME in the last 168 hours. Cardiac Enzymes: No results for input(s): CKTOTAL, CKMB, CKMBINDEX, TROPONINI in the last 168 hours. BNP (last 3 results) Recent Labs    12/08/24 1218  PROBNP 1,201.0*   HbA1C: No results for input(s): HGBA1C in the last 72  hours. CBG: Recent Labs  Lab 12/08/24 2120 12/08/24 2351 12/09/24 0757 12/09/24 0842  GLUCAP 462* 337* 58* 158*   Lipid Profile: No results for input(s): CHOL, HDL, LDLCALC, TRIG, CHOLHDL, LDLDIRECT in the last 72 hours. Thyroid  Function Tests: No results for input(s): TSH, T4TOTAL, FREET4, T3FREE, THYROIDAB in the last 72 hours. Anemia Panel: No results for input(s): VITAMINB12, FOLATE, FERRITIN, TIBC, IRON, RETICCTPCT in the last 72 hours. Sepsis Labs: No results for input(s): PROCALCITON, LATICACIDVEN in the last 168 hours.  Recent Results (from the past 240 hours)  Resp panel by RT-PCR (RSV, Flu A&B, Covid) Anterior Nasal Swab     Status: None   Collection Time: 12/08/24 12:18 PM   Specimen: Anterior Nasal Swab  Result Value Ref Range Status   SARS Coronavirus 2 by RT PCR NEGATIVE NEGATIVE Final    Comment: (NOTE) SARS-CoV-2 target nucleic acids are NOT DETECTED.  The SARS-CoV-2 RNA is generally detectable in upper respiratory specimens during the acute phase of infection. The lowest concentration of SARS-CoV-2 viral copies this assay  can detect is 138 copies/mL. A negative result does not preclude SARS-Cov-2 infection and should not be used as the sole basis for treatment or other patient management decisions. A negative result may occur with  improper specimen collection/handling, submission of specimen other than nasopharyngeal swab, presence of viral mutation(s) within the areas targeted by this assay, and inadequate number of viral copies(<138 copies/mL). A negative result must be combined with clinical observations, patient history, and epidemiological information. The expected result is Negative.  Fact Sheet for Patients:  bloggercourse.com  Fact Sheet for Healthcare Providers:  seriousbroker.it  This test is no t yet approved or cleared by the [place]  FDA and  has  been authorized for detection and/or diagnosis of SARS-CoV-2 by FDA under an Emergency Use Authorization (EUA). This EUA will remain  in effect (meaning this test can be used) for the duration of the COVID-19 declaration under Section 564(b)(1) of the Act, 21 U.S.C.section 360bbb-3(b)(1), unless the authorization is terminated  or revoked sooner.       Influenza A by PCR NEGATIVE NEGATIVE Final   Influenza B by PCR NEGATIVE NEGATIVE Final    Comment: (NOTE) The Xpert Xpress SARS-CoV-2/FLU/RSV plus assay is intended as an aid in the diagnosis of influenza from Nasopharyngeal swab specimens and should not be used as a sole basis for treatment. Nasal washings and aspirates are unacceptable for Xpert Xpress SARS-CoV-2/FLU/RSV testing.  Fact Sheet for Patients: bloggercourse.com  Fact Sheet for Healthcare Providers: seriousbroker.it  This test is not yet approved or cleared by the [place]  FDA and has been authorized for detection and/or diagnosis of SARS-CoV-2 by FDA under an Emergency Use Authorization (EUA). This EUA will remain in effect (meaning this test can be used) for the duration of the COVID-19 declaration under Section 564(b)(1) of the Act, 21 U.S.C. section 360bbb-3(b)(1), unless the authorization is terminated or revoked.     Resp Syncytial Virus by PCR NEGATIVE NEGATIVE Final    Comment: (NOTE) Fact Sheet for Patients: bloggercourse.com  Fact Sheet for Healthcare Providers: seriousbroker.it  This test is not yet approved or cleared by the [place]  FDA and has been authorized for detection and/or diagnosis of SARS-CoV-2 by FDA under an Emergency Use Authorization (EUA). This EUA will remain in effect (meaning this test can be used) for the duration of the COVID-19 declaration under Section 564(b)(1) of the Act, 21 U.S.C. section 360bbb-3(b)(1), unless the  authorization is terminated or revoked.  Performed at [facility], [address], [place], [place] [address]   Culture, blood (routine x 2)     Status: None (Preliminary result)   Collection Time: 12/08/24  1:53 PM   Specimen: BLOOD RIGHT ARM  Result Value Ref Range Status   Specimen Description   Final    BLOOD RIGHT ARM Performed at [facility], [address] [place], [place] [address]    Special Requests   Final    BOTTLES DRAWN AEROBIC AND ANAEROBIC Blood Culture results may not be optimal due to an inadequate volume of blood received in culture bottles Performed at [facility], [address] [place], [place] [address]    Culture   Final    NO GROWTH < 24 HOURS Performed at [facility], [address], [place], [place] [address]    Report Status PENDING  Incomplete  Culture, blood (routine x 2)     Status: None (Preliminary result)   Collection Time: 12/08/24  6:29 PM  Specimen: BLOOD  Result Value Ref Range Status   Specimen Description   Final    BLOOD SITE NOT SPECIFIED Performed at [facility], [address], [place], [place] [address]    Special Requests   Final    BOTTLES DRAWN AEROBIC AND ANAEROBIC Blood Culture results may not be optimal due to an inadequate volume of blood received in culture bottles Performed at [facility], [address], [place], [place] [address]    Culture   Final    NO GROWTH < 12 HOURS Performed at [facility], [address] [place], [place] [address]    Report Status PENDING  Incomplete         Radiology Studies: CT Angio Chest PE W/Cm &/Or Wo Cm Result Date: 12/08/2024 EXAM: CTA of the Chest with contrast for PE 12/08/2024 01:37:31 PM TECHNIQUE: CTA of the chest was performed after the administration of 75 mL Omnipaque  350 intravenous contrast. Multiplanar reformatted images are provided for review. MIP images are  provided for review. Automated exposure control, iterative reconstruction, and/or weight based adjustment of the mA/kV was utilized to reduce the radiation dose to as low as reasonably achievable. COMPARISON: CT chest 04/15/2024. CLINICAL HISTORY: Pulmonary embolism (PE) suspected, high prob. FINDINGS: PULMONARY ARTERIES: Pulmonary arteries are adequately opacified for evaluation. No pulmonary embolism. Main pulmonary artery is normal in caliber. MEDIASTINUM: The heart and pericardium demonstrate no acute abnormality. Aortic valve replacement. There is no acute abnormality of the thoracic aorta. LYMPH NODES: No mediastinal, hilar or axillary lymphadenopathy. LUNGS AND PLEURA: In the medial right upper lobe along the pleural surface there is enlargement soft tissue density measuring 2.4 x 2.0 cm (image 17 of series 7). This is essentially new from comparison CT 04/15/2024. No focal consolidation or pulmonary edema. No pleural effusion or pneumothorax. UPPER ABDOMEN: Hypoperfusion to the central spleen measuring 5.2 x 3.8 cm. SOFT TISSUES AND BONES: There are several healed posterior rib fractures on the left. Multiple levels of degenerative change and scoliosis in the upper thoracic spine. Anterior cervical spine fusion. No acute soft tissue abnormality. IMPRESSION: 1. No pulmonary embolism. 2. New 2.4 x 2.0 cm pleural-based right upper lobe mass, recommend PET/CT and/or tissue sampling. 3. Splenic perfusion defect. Indeterminate finding. Recommended additional PET scan if obtained. 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 LUNGS AND PLEURA: No focal pulmonary opacity. No pleural effusion. No pneumothorax. HEART AND  MEDIASTINUM: Aortic calcification. Aortic valve prosthesis. No acute abnormality of the cardiac and mediastinal silhouettes. BONES AND SOFT TISSUES: Right shoulder arthroplasty changes. Old healed fracture deformity of left proximal humerus. Levoscoliosis of thoracolumbar spine. Chronic right posterior rib fracture deformities. IMPRESSION: 1. No acute findings. Electronically signed by: Waddell Calk MD MD 12/08/2024 02:08 PM EST RP Workstation: [id number]        Scheduled Meds:  aspirin  EC  81 mg Oral BID   feeding supplement  237 mL Oral BID BM   insulin  aspart  0-15 Units Subcutaneous TID WC   insulin  aspart  0-5 Units Subcutaneous QHS   insulin  glargine-yfgn  6 Units Subcutaneous BID   ipratropium-albuterol   3 mL Nebulization BID   levETIRAcetam   250 mg Oral BID   metoprolol  tartrate  25 mg Oral BID   montelukast   10 mg Oral QHS   senna-docusate  1 tablet Oral QHS   simvastatin   40 mg Oral QHS   Continuous Infusions:  cefTRIAXone  (ROCEPHIN )  IV 2 g (12/08/24 2002)     LOS: 1 day    Time spent: 55 minutes    Renato Applebaum, MD [facility]   "

## 2024-12-09 NOTE — Evaluation (Signed)
 Physical Therapy Evaluation Patient Details Name: Danielle Clarke MRN: [id number] DOB: October 02, 1941 Today's Date: 12/09/2024  History of Present Illness  84 yo female presents to therapy following hospital admission from SNF secondary to dyspnea and hypoxemia and dx of acute respiratory failure. Pt recently in hospital secondary to R hip fx s/p hemiarthroplasty and R elbow I and D with wound closure and d/c to SNF on 1/2. Pt PMH includes but is not limited to: CAD, OA, aortic stenosis s/p TAVR, asthmatic bronchitis, HTN, HLD, DM II, CKD III, compression fx, intracranial bleed, seziure disorder, fall 11/23/2024 R femoral neck fx s/p R hip hemiarthroplasty.  Clinical Impression    Pt admitted with above diagnosis.  Pt currently with functional limitations due to the deficits listed below (see PT Problem List). Pt in bed, awake and initially agreeable to therapy intervention. No family present to provide insight to PLOF, end of session visitor arrived. Pt required min A for supine to sit, however pt adamantly declined to scoot forward to have B LE touch the floor, pt reported that the Doctor told her she could not use her R LE and could not put weight on her leg at all. PT made attempts to educate pt on R LE WBAT and no restrictions. PT unsuccessful and pt elected to return to bed. Pt left in bed, all needs in place and visitor present.  Patient will benefit from continued inpatient follow up therapy, <3 hours/day.  Pt will benefit from acute skilled PT to increase their independence and safety with mobility to allow discharge.         If plan is discharge home, recommend the following: A lot of help with bathing/dressing/bathroom;Two people to help with walking and/or transfers;Assistance with cooking/housework;Help with stairs or ramp for entrance;Assist for transportation   Can travel by private vehicle   No    Equipment Recommendations None recommended by PT  Recommendations for Other  Services       Functional Status Assessment Patient has had a recent decline in their functional status and demonstrates the ability to make significant improvements in function in a reasonable and predictable amount of time.     Precautions / Restrictions Precautions Precautions: Fall Recall of Precautions/Restrictions: Impaired Restrictions Weight Bearing Restrictions Per Provider Order: No      Mobility  Bed Mobility Overal bed mobility: Needs Assistance Bed Mobility: Supine to Sit     Supine to sit: Min assist     General bed mobility comments: pt agreeable to sitting up in bed, pt declined to scoot to EOB and place B LE on floor due to the MD telling her she cannot use her R LE    Transfers                   General transfer comment: pt declined    Ambulation/Gait               General Gait Details: NT  Stairs            Wheelchair Mobility     Tilt Bed    Modified Rankin (Stroke Patients Only)       Balance Overall balance assessment: Needs assistance Sitting-balance support: Feet unsupported, Bilateral upper extremity supported Sitting balance-Leahy Scale: Fair                                       Pertinent Vitals/Pain  Pain Assessment Pain Assessment: Faces Faces Pain Scale: Hurts a little bit Pain Location: R hip with mobility Pain Descriptors / Indicators: Aching, Discomfort, Grimacing, Guarding Pain Intervention(s): Limited activity within patient's tolerance, Repositioned    Home Living Family/patient expects to be discharged to:: Private residence Living Arrangements: Alone Available Help at Discharge: Family;Personal care attendant Type of Home: House Home Access: Stairs to enter Entrance Stairs-Rails: Left Entrance Stairs-Number of Steps: 2   Home Layout: One level Home Equipment: Shower seat;Grab bars - tub/shower;Rolling Environmental Consultant (2 wheels);Cane - single point;Wheelchair -  manual;BSC/3in1 Additional Comments: daughter lives next door, is a financial controller so at home intermittently. Pt takes care of her cat. Daughter in law assists sometimes, PCA assists 4-5X as needed (info from prior admission). s/p fall and R hip fx with hemi arthoplasty pt has been at [facility] for rehab (11/28/2024)    Prior Function Prior Level of Function : Needs assist             Mobility Comments: using RW per chart review ADLs Comments: Ind with ADLs/selfcare, light meal prep, daughter assists with showers, used a cane but has been1-2 weeks since she could walk because of pain. Prior likes to go shopping with her aide (info from prior admission)     Extremity/Trunk Assessment        Lower Extremity Assessment Lower Extremity Assessment: Generalized weakness    Cervical / Trunk Assessment Cervical / Trunk Assessment: Kyphotic  Communication   Communication Communication: No apparent difficulties    Cognition Arousal: Alert Behavior During Therapy: WFL for tasks assessed/performed   PT - Cognitive impairments: Problem solving, No family/caregiver present to determine baseline, Awareness, Memory, Attention, Initiation                       PT - Cognition Comments: pt reports she cannot use her R LE and the MD said so plus it is too early to try, PT attempted to educate pt on R LE WBAT and date of surgry as well as recent stent in SNF. pt was adament that she could not put weight on her R LE however had transfered bed <> BSC with nursing staff earlier in the day Following commands: Impaired Following commands impaired: Follows one step commands inconsistently     Cueing Cueing Techniques: Verbal cues, Gestural cues, Tactile cues, Visual cues     General Comments      Exercises     Assessment/Plan    PT Assessment Patient needs continued PT services  PT Problem List Decreased strength;Decreased activity tolerance;Decreased balance;Decreased mobility;Decreased  cognition;Decreased safety awareness;Pain       PT Treatment Interventions DME instruction;Gait training;Functional mobility training;Therapeutic activities;Therapeutic exercise;Balance training;Patient/family education    PT Goals (Current goals can be found in the Care Plan section)  Acute Rehab PT Goals Patient Stated Goal: to go home PT Goal Formulation: With patient Time For Goal Achievement: 12/23/24 Potential to Achieve Goals: Fair    Frequency Min 2X/week     Co-evaluation               AM-PAC PT 6 Clicks Mobility  Outcome Measure Help needed turning from your back to your side while in a flat bed without using bedrails?: A Lot Help needed moving from lying on your back to sitting on the side of a flat bed without using bedrails?: A Lot Help needed moving to and from a bed to a chair (including a wheelchair)?: A Lot Help needed standing  up from a chair using your arms (e.g., wheelchair or bedside chair)?: A Lot Help needed to walk in hospital room?: Total Help needed climbing 3-5 steps with a railing? : Total 6 Click Score: 10    End of Session   Activity Tolerance: Other (comment) (pt exhibits self limiting behaviors associated with R LE) Patient left: with call bell/phone within reach;in bed;with bed alarm set;with family/visitor present Nurse Communication: Mobility status PT Visit Diagnosis: Pain;Unsteadiness on feet (R26.81);History of falling (Z91.81);Muscle weakness (generalized) (M62.81);Difficulty in walking, not elsewhere classified (R26.2) Pain - Right/Left: Right Pain - part of body: Hip    Time: 8392-8382 PT Time Calculation (min) (ACUTE ONLY): 10 min   Charges:   PT Evaluation $PT Eval Low Complexity: 1 Low   PT General Charges $$ ACUTE PT VISIT: 1 Visit         [place], PT Acute Rehab   [place] VEAR Drone 12/09/2024, 5:15 PM

## 2024-12-09 NOTE — TOC Initial Note (Signed)
 Transition of Care [facility]) - Initial/Assessment Note    Patient Details  Name: Danielle Clarke MRN: [id number] Date of Birth: 05-Aug-1941  Transition of Care [facility]) CM/SW Contact:    Heather DELENA Saltness, LCSW Phone Number: 12/09/2024, 3:45 PM  Clinical Narrative:                 Pt admitted to the hospital for shortness of breath. Pt not on oxygen at home. Pt currently a short-term SNF rehab resident at [facility]. PT evaluation pending. TOC will continue to follow.    Expected Discharge Plan: Skilled Nursing Facility Barriers to Discharge: Continued Medical Work up   Patient Goals and CMS Choice Patient states their goals for this hospitalization and ongoing recovery are:: To return to [facility]   Choice offered to / list presented to : NA [place] ownership interest in [facility].provided to:: Parent NA    Expected Discharge Plan and Services In-house Referral: Clinical Social Work Discharge Planning Services: NA Post Acute Care Choice: Skilled Nursing Facility Living arrangements for the past 2 months: Single Family Home                 DME Arranged: N/A DME Agency: NA       HH Arranged: NA HH Agency: NA        Prior Living Arrangements/Services Living arrangements for the past 2 months: Single Family Home Lives with:: Self, Adult Children Patient language and need for interpreter reviewed:: Yes Do you feel safe going back to the place where you live?: Yes      Need for Family Participation in Patient Care: Yes (Comment) Care giver support system in place?: Yes (comment)   Criminal Activity/Legal Involvement Pertinent to Current Situation/Hospitalization: No - Comment as needed  Activities of Daily Living   ADL Screening (condition at time of admission) Independently performs ADLs?: No Does the patient have a NEW difficulty with bathing/dressing/toileting/self-feeding that is expected to last >3 days?: Yes (Initiates electronic notice to provider for  possible OT consult) Does the patient have a NEW difficulty with getting in/out of bed, walking, or climbing stairs that is expected to last >3 days?: Yes (Initiates electronic notice to provider for possible PT consult) Does the patient have a NEW difficulty with communication that is expected to last >3 days?: Yes (Initiates electronic notice to provider for possible SLP consult) Is the patient deaf or have difficulty hearing?: Yes Does the patient have difficulty seeing, even when wearing glasses/contacts?: Yes Does the patient have difficulty concentrating, remembering, or making decisions?: Yes  Permission Sought/Granted Permission sought to share information with : Facility Medical Sales Representative, Family Supports Permission granted to share information with : Yes, Verbal Permission Granted  Share Information with NAME: Zykiria Bruening  Permission granted to share info w AGENCY: [facility]  Permission granted to share info w Relationship: Daughter  Permission granted to share info w Contact Information: [phone]  Emotional Assessment Appearance:: Appears stated age Attitude/Demeanor/Rapport: Unable to Assess Affect (typically observed): Unable to Assess Orientation: : Oriented to Self, Oriented to Place, Oriented to  Time, Oriented to Situation Alcohol / Substance Use: Not Applicable Psych Involvement: No (comment)  Admission diagnosis:  Acute respiratory failure with hypoxia (HCC) [J96.01] Acute hypoxemic respiratory failure (HCC) [J96.01] Mass of upper lobe of right lung [R91.8] Patient Active Problem List   Diagnosis Date Noted   Acute respiratory failure with hypoxia (HCC) 12/08/2024   Mass of upper lobe of right lung 12/08/2024   Closed right  hip fracture (HCC) 11/23/2024   Seizures (HCC) 11/23/2024   Protein-calorie malnutrition, severe 04/03/2024   Acute hypoxic respiratory failure (HCC) 04/01/2024   Compression fracture of T12 vertebra (HCC) 04/01/2024   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   Closed comminuted left humeral fracture 11/19/2021   CKD (chronic kidney disease), stage III (HCC) 10/14/2020   Abnormality of gait 12/01/2019   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) 12/01/2019   Traumatic brain injury with loss of consciousness (HCC) 05/28/2019   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 05/20/2019   Labile blood pressure    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    Multiple closed fractures of ribs of right side    Hypoalbuminemia due to protein-calorie malnutrition    Traumatic cerebral intraparenchymal hematoma (HCC) 02/14/2019   Pacemaker    Labile blood glucose    Dyslipidemia    Anemia of chronic disease    Intracerebral hemorrhage (HCC) 02/07/2019   Intracranial bleeding (HCC) 02/06/2019   Aortic stenosis 08/25/2015   Cervical stenosis of spinal canal 07/31/2014   Essential hypertension, benign 12/30/2013   Loss of consciousness (HCC) 01/02/2013   Diabetes mellitus type 1 (HCC) 01/02/2013   Bilateral carotid artery disease 01/02/2013   GOITER, MULTINODULAR 02/11/2008   PCP:  [name], MD Pharmacy:   [facility] [address], [place] - [address] [place] [address] Phone: [phone] Fax: [phone]   Social Drivers of Health (SDOH) Social History: SDOH Screenings   Food Insecurity: No Food Insecurity (11/24/2024)  Housing: Low Risk (11/24/2024)  Transportation Needs: No Transportation Needs (11/24/2024)  Utilities: Patient Unable To Answer (11/24/2024)  Social Connections: Unknown (11/24/2024)  Tobacco Use: Low Risk (12/08/2024)   SDOH Interventions: None     Readmission Risk Interventions    12/09/2024    3:42 PM 04/23/2024   11:16 AM 04/02/2024   11:26 AM  Readmission Risk Prevention Plan  Transportation Screening  Complete Complete  PCP or Specialist Appt within 5-7 Days  Complete Complete Complete  Home Care Screening Complete Complete Complete  Medication Review (RN CM) Complete Complete Referral to Pharmacy    Signed: Heather Saltness, MSW, LCSW Clinical Social Worker Inpatient Care Management 12/09/2024 3:47 PM

## 2024-12-10 DIAGNOSIS — J9601 Acute respiratory failure with hypoxia: Secondary | ICD-10-CM | POA: Diagnosis not present

## 2024-12-10 LAB — GLUCOSE, CAPILLARY
Glucose-Capillary: 399 mg/dL — ABNORMAL HIGH (ref 70–99)
Glucose-Capillary: 381 mg/dL — ABNORMAL HIGH (ref 70–99)
Glucose-Capillary: 403 mg/dL — ABNORMAL HIGH (ref 70–99)
Glucose-Capillary: 255 mg/dL — ABNORMAL HIGH (ref 70–99)

## 2024-12-10 MED ORDER — CEPHALEXIN 500 MG PO CAPS
500.0000 mg | ORAL_CAPSULE | Freq: Three times a day (TID) | ORAL | Status: DC
  Administered 2024-12-10 (×2): 500 mg via ORAL
  Filled 2024-12-10 (×2): qty 1

## 2024-12-10 NOTE — Progress Notes (Signed)
 " PROGRESS NOTE    Danielle Clarke  FMW:[id number] DOB: 07/13/1941 DOA: 12/08/2024 PCP: [name], MD    Brief Narrative:  84 year old with history of aortic stenosis status post TAVR, osteoarthritis, asthmatic bronchitis, coronary artery disease, hypertension hyperlipidemia, type 1 diabetes on insulin , CKD stage IIIa, multiple compression fractures, history of intracranial bleed and seizure disorder who was recently admitted to the hospital on 12/28 with traumatic right hip fracture underwent ORIF and discharged to SNF on 1/2.  Facility called EMS due to dyspnea and hypoxemia with 88%  at her facility.  No other symptoms of infection. In the emergency room WBC count 18.3, proBNP 12,000.  Troponin 19.  COVID, flu and RSV negative.  Blood glucose 378. Chest radiograph without any acute findings.  CT angiogram of the chest with no PE, no evidence of pneumonia.  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  Admitted due to significant symptoms.  She was 86% on room air and needed 2 L of oxygen.  Subjective: Patient seen and examined.  Pleasant interactive.  Slow to respond.  Denies any complaints.  She tells me she feels weak and tired.  Remains on room air even with mobility with therapist yesterday.  Agreeable to go to a skilled nursing facility.   Assessment & Plan:   Acute hypoxemia, suspected acute bronchitis with wheezing: Treated symptomatically.  Clinically improving.  On room air today. Will treat with 7 days of antibiotic therapy, continue chest physiotherapy, incentive spirometry and mucolytic's. Physical therapy and Occupational Therapy today.  Right upper lobe pleural-based tumor: Not likely causing acute hypoxemia. Goal of care discussion, patient is very frail and debilitated.  She does not want to have surgery or she is not a candidate for chemotherapy or other treatment even if this is cancer.  Discussed with her daughter in details.  Monitoring  but do not anticipate any biopsy. referral to pulmonary tumor clinic for follow-up however do not anticipate surgery at procedure.  Type 1 diabetes with hyperglycemia, hypoglycemia due to excess insulin . Blood sugars elevated now after she did not receive any insulin  yesterday. Resume Lantus  6 units twice daily, keep on sliding scale insulin .  Allow regular diet.  Essential hypertension: Blood pressure is stable on metoprolol  25 mg twice daily.  History of TAVR: Stable.  Seizure disorder: On Keppra  250 mg twice daily.  No evidence of new seizure.  Right hip fracture: ORIF 12/29. Weightbearing as tolerated.  Looks stable.  Start PT OT.  Refer back to a SNF. Aspirin  81 mg twice daily for DVT prophylaxis. Adequate pain medications with a bowel regimen.      DVT prophylaxis: SCDs Start: 12/08/24 1740   Code Status: DNR/DNI Family Communication: None today Disposition Plan: Status is: Inpatient Remains inpatient appropriate because: Clinically improved.  Waiting for SNF bed.     Consultants:  None  Procedures:  None  Antimicrobials:  Rocephin  1/12---     Objective: Vitals:   12/10/24 0511 12/10/24 0843 12/10/24 1112 12/10/24 1205  BP: (!) 145/50 (!) 124/38 (!) 120/40 (!) 116/41  Pulse: 81 81 84 80  Resp: 15 16  18   Temp: 98.3 F (36.8 C) 98.7 F (37.1 C)  98.4 F (36.9 C)  TempSrc:  Oral  Oral  SpO2:      Weight:      Height:        Intake/Output Summary (Last 24 hours) at 12/10/2024 1249 Last data filed at 12/10/2024 0900 Gross  per 24 hour  Intake 150 ml  Output --  Net 150 ml   Filed Weights   12/08/24 1143  Weight: 47 kg    Examination:  General exam: Appears calm and comfortable.  Very frail and debilitated.  She is on room air.  Does not look like any distress. Respiratory system: Clear to auscultation. Respiratory effort normal.  No added sounds.  On room air. Cardiovascular system: S1 & S2 heard, RRR. No JVD, murmurs, rubs, gallops or clicks.  No pedal edema. Gastrointestinal system: Abdomen is nondistended, soft and nontender. No organomegaly or masses felt. Normal bowel sounds heard. Central nervous system: Alert and oriented.  Gross generalized weakness.  Forgetful. Right anterior hip incision clean and dry.     Data Reviewed: I have personally reviewed following labs and imaging studies  CBC: Recent Labs  Lab 12/08/24 1218 12/09/24 0606  WBC 18.3* 13.5*  HGB 10.5* 9.2*  HCT 32.6* 27.5*  MCV 83.8 82.3  PLT 484* 413*   Basic Metabolic Panel: Recent Labs  Lab 12/08/24 1218 12/09/24 0606  NA 133* 136  K 4.5 3.6  CL 95* 98  CO2 29 30  GLUCOSE 378* 57*  BUN 29* 23  CREATININE 0.68 0.58  CALCIUM 8.6* 8.7*   GFR: Estimated Creatinine Clearance: 38.9 mL/min (by C-G formula based on SCr of 0.58 mg/dL). Liver Function Tests: Recent Labs  Lab 12/09/24 0606  AST 13*  ALT 6  ALKPHOS 113  BILITOT <0.2  PROT 6.5  ALBUMIN  2.8*   No results for input(s): LIPASE, AMYLASE in the last 168 hours. No results for input(s): AMMONIA in the last 168 hours. Coagulation Profile: No results for input(s): INR, PROTIME in the last 168 hours. Cardiac Enzymes: No results for input(s): CKTOTAL, CKMB, CKMBINDEX, TROPONINI in the last 168 hours. BNP (last 3 results) Recent Labs    12/08/24 1218  PROBNP 1,201.0*   HbA1C: No results for input(s): HGBA1C in the last 72 hours. CBG: Recent Labs  Lab 12/09/24 1217 12/09/24 1646 12/09/24 2056 12/10/24 0718 12/10/24 1145  GLUCAP 387* 384* 264* 399* 381*   Lipid Profile: No results for input(s): CHOL, HDL, LDLCALC, TRIG, CHOLHDL, LDLDIRECT in the last 72 hours. Thyroid  Function Tests: No results for input(s): TSH, T4TOTAL, FREET4, T3FREE, THYROIDAB in the last 72 hours. Anemia Panel: No results for input(s): VITAMINB12, FOLATE, FERRITIN, TIBC, IRON, RETICCTPCT in the last 72 hours. Sepsis Labs: No results for  input(s): PROCALCITON, LATICACIDVEN in the last 168 hours.  Recent Results (from the past 240 hours)  Resp panel by RT-PCR (RSV, Flu A&B, Covid) Anterior Nasal Swab     Status: None   Collection Time: 12/08/24 12:18 PM   Specimen: Anterior Nasal Swab  Result Value Ref Range Status   SARS Coronavirus 2 by RT PCR NEGATIVE NEGATIVE Final    Comment: (NOTE) SARS-CoV-2 target nucleic acids are NOT DETECTED.  The SARS-CoV-2 RNA is generally detectable in upper respiratory specimens during the acute phase of infection. The lowest concentration of SARS-CoV-2 viral copies this assay can detect is 138 copies/mL. A negative result does not preclude SARS-Cov-2 infection and should not be used as the sole basis for treatment or other patient management decisions. A negative result may occur with  improper specimen collection/handling, submission of specimen other than nasopharyngeal swab, presence of viral mutation(s) within the areas targeted by this assay, and inadequate number of viral copies(<138 copies/mL). A negative result must be combined with clinical observations, patient history, and epidemiological information. The expected  result is Negative.  Fact Sheet for Patients:  bloggercourse.com  Fact Sheet for Healthcare Providers:  seriousbroker.it  This test is no t yet approved or cleared by the [place]  FDA and  has been authorized for detection and/or diagnosis of SARS-CoV-2 by FDA under an Emergency Use Authorization (EUA). This EUA will remain  in effect (meaning this test can be used) for the duration of the COVID-19 declaration under Section 564(b)(1) of the Act, 21 U.S.C.section 360bbb-3(b)(1), unless the authorization is terminated  or revoked sooner.       Influenza A by PCR NEGATIVE NEGATIVE Final   Influenza B by PCR NEGATIVE NEGATIVE Final    Comment: (NOTE) The Xpert Xpress SARS-CoV-2/FLU/RSV plus assay is  intended as an aid in the diagnosis of influenza from Nasopharyngeal swab specimens and should not be used as a sole basis for treatment. Nasal washings and aspirates are unacceptable for Xpert Xpress SARS-CoV-2/FLU/RSV testing.  Fact Sheet for Patients: bloggercourse.com  Fact Sheet for Healthcare Providers: seriousbroker.it  This test is not yet approved or cleared by the [place]  FDA and has been authorized for detection and/or diagnosis of SARS-CoV-2 by FDA under an Emergency Use Authorization (EUA). This EUA will remain in effect (meaning this test can be used) for the duration of the COVID-19 declaration under Section 564(b)(1) of the Act, 21 U.S.C. section 360bbb-3(b)(1), unless the authorization is terminated or revoked.     Resp Syncytial Virus by PCR NEGATIVE NEGATIVE Final    Comment: (NOTE) Fact Sheet for Patients: bloggercourse.com  Fact Sheet for Healthcare Providers: seriousbroker.it  This test is not yet approved or cleared by the [place]  FDA and has been authorized for detection and/or diagnosis of SARS-CoV-2 by FDA under an Emergency Use Authorization (EUA). This EUA will remain in effect (meaning this test can be used) for the duration of the COVID-19 declaration under Section 564(b)(1) of the Act, 21 U.S.C. section 360bbb-3(b)(1), unless the authorization is terminated or revoked.  Performed at [facility], [address], [place], [place] [address]   Culture, blood (routine x 2)     Status: None (Preliminary result)   Collection Time: 12/08/24  1:53 PM   Specimen: BLOOD RIGHT ARM  Result Value Ref Range Status   Specimen Description   Final    BLOOD RIGHT ARM Performed at [facility], [address], [place], [place] [address]    Special Requests   Final    BOTTLES DRAWN AEROBIC AND ANAEROBIC Blood Culture results  may not be optimal due to an inadequate volume of blood received in culture bottles Performed at [facility], [address], [place], [place] [address]    Culture   Final    NO GROWTH 2 DAYS Performed at [facility], [address], [place], [place] [address]    Report Status PENDING  Incomplete  Culture, blood (routine x 2)     Status: None (Preliminary result)   Collection Time: 12/08/24  6:29 PM   Specimen: BLOOD  Result Value Ref Range Status   Specimen Description   Final    BLOOD SITE NOT SPECIFIED Performed at [facility], [address] [place], [place] [address]    Special Requests   Final    BOTTLES DRAWN AEROBIC AND ANAEROBIC Blood Culture results may not be optimal due to an inadequate volume of blood received in culture bottles Performed at [facility], [address] [place],  [place] [address]    Culture   Final    NO GROWTH 1 DAY Performed at [facility], [address] [place], [place] [address]    Report Status PENDING  Incomplete         Radiology Studies: CT Angio Chest PE W/Cm &/Or Wo Cm Result Date: 12/08/2024 EXAM: CTA of the Chest with contrast for PE 12/08/2024 01:37:31 PM TECHNIQUE: CTA of the chest was performed after the administration of 75 mL Omnipaque  350 intravenous contrast. Multiplanar reformatted images are provided for review. MIP images are provided for review. Automated exposure control, iterative reconstruction, and/or weight based adjustment of the mA/kV was utilized to reduce the radiation dose to as low as reasonably achievable. COMPARISON: CT chest 04/15/2024. CLINICAL HISTORY: Pulmonary embolism (PE) suspected, high prob. FINDINGS: PULMONARY ARTERIES: Pulmonary arteries are adequately opacified for evaluation. No pulmonary embolism. Main pulmonary artery is normal in caliber. MEDIASTINUM: The heart and pericardium demonstrate no acute abnormality. Aortic valve  replacement. There is no acute abnormality of the thoracic aorta. LYMPH NODES: No mediastinal, hilar or axillary lymphadenopathy. LUNGS AND PLEURA: In the medial right upper lobe along the pleural surface there is enlargement soft tissue density measuring 2.4 x 2.0 cm (image 17 of series 7). This is essentially new from comparison CT 04/15/2024. No focal consolidation or pulmonary edema. No pleural effusion or pneumothorax. UPPER ABDOMEN: Hypoperfusion to the central spleen measuring 5.2 x 3.8 cm. SOFT TISSUES AND BONES: There are several healed posterior rib fractures on the left. Multiple levels of degenerative change and scoliosis in the upper thoracic spine. Anterior cervical spine fusion. No acute soft tissue abnormality. IMPRESSION: 1. No pulmonary embolism. 2. New 2.4 x 2.0 cm pleural-based right upper lobe mass, recommend PET/CT and/or tissue sampling. 3. Splenic perfusion defect. Indeterminate finding. Recommended additional PET scan if obtained. Electronically signed by: [name] Boxer MD MD 12/08/2024 02:52 PM EST RP Workstation: [id number]        Scheduled Meds:  aspirin  EC  81 mg Oral BID   cephALEXin   500 mg Oral Q8H   feeding supplement  237 mL Oral BID BM   insulin  aspart  0-15 Units Subcutaneous TID WC   insulin  aspart  0-5 Units Subcutaneous QHS   insulin  glargine-yfgn  6 Units Subcutaneous BID   levETIRAcetam   250 mg Oral BID   metoprolol  tartrate  25 mg Oral BID   montelukast   10 mg Oral QHS   senna-docusate  1 tablet Oral QHS   simvastatin   40 mg Oral QHS   Continuous Infusions:     LOS: 2 days    Time spent: 40 minutes    Renato Applebaum, MD [facility]   "

## 2024-12-10 NOTE — TOC Progression Note (Addendum)
 Transition of Care [facility]) - Progression Note    Patient Details  Name: Danielle Clarke MRN: [id number] Date of Birth: 1941-01-23  [facility]) CM/SW Contact  Alfonse JONELLE Rex, RN Phone Number: 12/10/2024, 12:01 PM  Clinical Narrative:  PT eval completed, recommendation for short term rehab/SNF. Call to patient's daughter, Danielle Clarke, introduced self and role of INPT CM. [name] agreeable to SNF, prefers [facility]. FL2 updated, faxed out for bed offers   -3;20pm [place], admit coordinator w/[facility] confirmed bed offer, patient able to return. SNF auth initiated, Auth ID [id number], auth pending.       Expected Discharge Plan: Skilled Nursing Facility Barriers to Discharge: Continued Medical Work up               Expected Discharge Plan and Services In-house Referral: Clinical Social Work Discharge Planning Services: NA Post Acute Care Choice: Skilled Nursing Facility Living arrangements for the past 2 months: Single Family Home                 DME Arranged: N/A DME Agency: NA       HH Arranged: NA HH Agency: NA         Social Drivers of Health (SDOH) Interventions SDOH Screenings   Food Insecurity: No Food Insecurity (11/24/2024)  Housing: Low Risk (11/24/2024)  Transportation Needs: No Transportation Needs (11/24/2024)  Utilities: Patient Unable To Answer (11/24/2024)  Social Connections: Unknown (11/24/2024)  Tobacco Use: Low Risk (12/08/2024)    Readmission Risk Interventions    12/10/2024   12:01 PM 12/09/2024    3:42 PM 04/23/2024   11:16 AM  Readmission Risk Prevention Plan  Transportation Screening Complete  Complete  PCP or Specialist Appt within 5-7 Days Complete Complete Complete  Home Care Screening Complete Complete Complete  Medication Review (RN CM) Complete Complete Complete

## 2024-12-10 NOTE — Plan of Care (Signed)

## 2024-12-10 NOTE — [place] FL2 (Signed)
 " [place]  MEDICAID [id number] LEVEL OF CARE FORM     IDENTIFICATION  Patient Name: Danielle Clarke Birthdate: 22-Jan-1941 Sex: female Admission Date (Current Location): 12/08/2024  [facility] and [place] Number:  Producer, Television/film/video and Address:  [facility],  [address] [place], [place] [address]      Provider Number: [id number]  Attending Physician Name and Address:  Raenelle Coria, MD  Relative Name and Phone Number:  Espey,[name]  Daughter, Emergency Contact  [phone] (Mobile)    Current Level of Care: Hospital Recommended Level of Care: Skilled Nursing Facility Prior Approval Number:    Date Approved/Denied:   PASRR Number: [id number]  Discharge Plan:      Current Diagnoses: Patient Active Problem List   Diagnosis Date Noted   Acute respiratory failure with hypoxia (HCC) 12/08/2024   Mass of upper lobe of right lung 12/08/2024   Closed right hip fracture (HCC) 11/23/2024   Seizures (HCC) 11/23/2024   Protein-calorie malnutrition, severe 04/03/2024   Acute hypoxic respiratory failure (HCC) 04/01/2024   Compression fracture of T12 vertebra (HCC) 04/01/2024   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   Closed comminuted left humeral fracture 11/19/2021   CKD (chronic kidney disease), stage III (HCC) 10/14/2020   Abnormality of gait 12/01/2019   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) 12/01/2019   Traumatic brain injury with loss of consciousness (HCC) 05/28/2019   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 05/20/2019   Labile blood pressure    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    Multiple closed fractures of ribs of right side    Hypoalbuminemia due to protein-calorie malnutrition    Traumatic cerebral intraparenchymal hematoma (HCC) 02/14/2019   Pacemaker    Labile blood glucose    Dyslipidemia    Anemia of chronic disease    Intracerebral hemorrhage (HCC) 02/07/2019    Intracranial bleeding (HCC) 02/06/2019   Aortic stenosis 08/25/2015   Cervical stenosis of spinal canal 07/31/2014   Essential hypertension, benign 12/30/2013   Loss of consciousness (HCC) 01/02/2013   Diabetes mellitus type 1 (HCC) 01/02/2013   Bilateral carotid artery disease 01/02/2013   GOITER, MULTINODULAR 02/11/2008    Orientation RESPIRATION BLADDER Height & Weight     Self, Time, Place  Normal Continent Weight: 47 kg Height:  5' (152.4 cm)  BEHAVIORAL SYMPTOMS/MOOD NEUROLOGICAL BOWEL NUTRITION STATUS      Continent Diet (Heart Healthy)  AMBULATORY STATUS COMMUNICATION OF NEEDS Skin   Extensive Assist Verbally Surgical wounds (R TKA, Rt elbow wound w/foal lift dressing)                       Personal Care Assistance Level of Assistance  Bathing, Feeding, Dressing Bathing Assistance: Limited assistance Feeding assistance: Limited assistance       Functional Limitations Info  Sight, Hearing, Speech Sight Info: Impaired Hearing Info: Impaired (hard of hearing) Speech Info: Adequate    SPECIAL CARE FACTORS FREQUENCY  PT (By licensed PT), OT (By licensed OT)     PT Frequency: 5x/wk OT Frequency: 5x/wk            Contractures Contractures Info: Not present    Additional Factors Info  Code Status, Allergies, Psychotropic Code Status Info: Full code Allergies Info: Augmentin (Amoxicillin-pot Clavulanate), Ceclor (Cefaclor), Firvanq  (Vancomycin ), Sumycin (Tetracycline), Accupril  (Quinapril  Hcl), Bactrim  (Sulfamethoxazole -trimethoprim ), Fosamax (Alendronate Sodium), Nsaids, Deltasone (Prednisone) Psychotropic Info: N/A  Current Medications (12/10/2024):  This is the current hospital active medication list Current Facility-Administered Medications  Medication Dose Route Frequency Provider Last Rate Last Admin   acetaminophen  (TYLENOL ) tablet 650 mg  650 mg Oral Q6H PRN Celinda Alm Lot, MD       Or   acetaminophen  (TYLENOL ) suppository 650 mg   650 mg Rectal Q6H PRN Celinda Alm Lot, MD       albuterol  (PROVENTIL ) (2.5 MG/3ML) 0.083% nebulizer solution 2.5 mg  2.5 mg Nebulization Q4H PRN Celinda Alm Lot, MD       aspirin  EC tablet 81 mg  81 mg Oral BID Celinda Alm Lot, MD   81 mg at 12/10/24 1117   cefTRIAXone  (ROCEPHIN ) 2 g in sodium chloride  0.9 % 100 mL IVPB  2 g Intravenous Q24H Celinda Alm Lot, MD 200 mL/hr at 12/09/24 2141 2 g at 12/09/24 2141   feeding supplement (ENSURE PLUS HIGH PROTEIN) liquid 237 mL  237 mL Oral BID BM Celinda Alm Lot, MD   237 mL at 12/10/24 1118   guaiFENesin -dextromethorphan  (ROBITUSSIN DM) 100-10 MG/5ML syrup 5 mL  5 mL Oral Q4H PRN Daniels, James K, NP   5 mL at 12/08/24 1955   insulin  aspart (novoLOG ) injection 0-15 Units  0-15 Units Subcutaneous TID WC [place] Isaiah CROME, NP   15 Units at 12/10/24 0842   insulin  aspart (novoLOG ) injection 0-5 Units  0-5 Units Subcutaneous QHS [place] Isaiah CROME, NP   3 Units at 12/09/24 2140   insulin  glargine-yfgn (SEMGLEE ) injection 6 Units  6 Units Subcutaneous BID Celinda Alm Lot, MD   6 Units at 12/10/24 1100   levETIRAcetam  (KEPPRA ) tablet 250 mg  250 mg Oral BID Celinda Alm Lot, MD   250 mg at 12/10/24 1100   methocarbamol  (ROBAXIN ) tablet 500 mg  500 mg Oral Q8H PRN Celinda Alm Lot, MD       metoprolol  tartrate (LOPRESSOR ) tablet 25 mg  25 mg Oral BID Celinda Alm Lot, MD   25 mg at 12/10/24 1116   montelukast  (SINGULAIR ) tablet 10 mg  10 mg Oral QHS Celinda Alm Lot, MD   10 mg at 12/08/24 2219   ondansetron  (ZOFRAN ) tablet 4 mg  4 mg Oral Q6H PRN Celinda Alm Lot, MD       Or   ondansetron  (ZOFRAN ) injection 4 mg  4 mg Intravenous Q6H PRN Celinda Alm Lot, MD       oxyCODONE  (Oxy IR/ROXICODONE ) immediate release tablet 2.5-5 mg  2.5-5 mg Oral Q4H PRN Celinda Alm Lot, MD       senna-docusate (Senokot-S) tablet 1 tablet  1 tablet Oral QHS Celinda Alm Lot, MD   1 tablet at 12/08/24 2219   simvastatin  (ZOCOR ) tablet 40  mg  40 mg Oral QHS Celinda Alm Lot, MD   40 mg at 12/08/24 2219   traZODone  (DESYREL ) tablet 25 mg  25 mg Oral QHS PRN Celinda Alm Lot, MD         Discharge Medications: Please see discharge summary for a list of discharge medications.  Relevant Imaging Results:  Relevant Lab Results:   Additional Information SSN: [id number]  Alfonse JONELLE Rex, RN     "

## 2024-12-10 NOTE — Progress Notes (Signed)
 [facility], Provider updated,  V.O received to administer Novolog  15 units.

## 2024-12-10 NOTE — Evaluation (Signed)
 Occupational Therapy Evaluation Patient Details Name: Danielle Clarke MRN: [id number] DOB: 1941/07/26 Today's Date: 12/10/2024   History of Present Illness   84 yr old female who presented to the hospital from SNF rehab, secondary to dyspnea and hypoxia; she was found to have acute respiratory failure. Pt was very recently in the hospital secondary to R hip fracture s/p hemiarthroplasty and R elbow I&D with wound closure and subsequent discharge to SNF on 11/28/24. PMH includes but is not limited to: CAD, OA, aortic stenosis s/p TAVR, asthmatic bronchitis, HTN, HLD, DM II, CKD III, compression fx, intracranial bleed, seziure disorder, fall 11/23/2024 R femoral neck fx s/p R hip hemiarthroplasty.     Clinical Impressions The pt is currently presenting below her baseline level of functioning for performing ADLs (she's normally modified independent to independent with ADLs). She is limited by the below listed deficits (see OT problem list). As such, her ADL performance is compromised and she is requiring assistance for self care management. During the OT session today, she required mod assist for supine to sit, max assist for simulated lower body dressing, and CGA for sitting balance EOB. She reported feeling lightheaded and weak after a few minutes of sitting EOB, and she subsequently proceeded to return to supine. She will benefit from further OT services to maximize her safety and independence with ADLs & to decrease the risk for restricted participation in meaningful activities. Patient will benefit from continued inpatient follow up therapy, <3 hours/day.      If plan is discharge home, recommend the following:   A lot of help with walking and/or transfers;A lot of help with bathing/dressing/bathroom;Assistance with cooking/housework;Direct supervision/assist for medications management;Direct supervision/assist for financial management;Assist for transportation;Help with stairs or ramp for  entrance     Functional Status Assessment   Patient has had a recent decline in their functional status and demonstrates the ability to make significant improvements in function in a reasonable and predictable amount of time.     Equipment Recommendations   Other (comment) (defer to next setting)     Recommendations for Other Services         Precautions/Restrictions   Precautions Precautions: Fall Restrictions Weight Bearing Restrictions Per Provider Order: No RLE Weight Bearing Per Provider Order: Weight bearing as tolerated     Mobility Bed Mobility Overal bed mobility: Needs Assistance Bed Mobility: Sit to Supine, Supine to Sit     Supine to sit: Mod assist, HOB elevated Sit to supine: Mod assist (required assist for BLE back onto bed)        Transfers      General transfer comment: the pt declined to attempt standing, stating she felt lightheaded then proceeding to return to supine      Balance     Sitting balance-Leahy Scale: Fair         ADL either performed or assessed with clinical judgement   ADL Overall ADL's : Needs assistance/impaired Eating/Feeding: Set up;Bed level   Grooming: Set up;Supervision/safety;Bed level   Upper Body Bathing: Minimal assistance;Bed level   Lower Body Bathing: Maximal assistance;Bed level   Upper Body Dressing : Minimal assistance;Bed level   Lower Body Dressing: Maximal assistance;Bed level       Toileting- Clothing Manipulation and Hygiene: Maximal assistance;Bed level               Vision   Additional Comments: She correctly read the time depicted on the wall clock, after she incorrectly stated it on the first attempt.  Pertinent Vitals/Pain Pain Assessment Pain Assessment: No/denies pain     Extremity/Trunk Assessment Upper Extremity Assessment Upper Extremity Assessment: Right hand dominant;RUE deficits/detail;LUE deficits/detail RUE Deficits / Details: Chronic  appearing shoulder AROM limitations, though not severe. Elbow and hand AROM WFL. Grip strength 4-/5 LUE Deficits / Details: Chronic appearing shoulder AROM limitations, though not severe. Elbow and hand AROM WFL. Grip strength 4-/5   Lower Extremity Assessment Lower Extremity Assessment: Generalized weakness;RLE deficits/detail;LLE deficits/detail RLE Deficits / Details: Knee and anke AROM WFL. Recent R hip surgery LLE Deficits / Details: Knee and anke AROM WFL.       Communication Communication Communication: No apparent difficulties   Cognition Arousal: Alert Behavior During Therapy: WFL for tasks assessed/performed     Orientation impairments: Situation       Executive functioning impairment (select all impairments): Problem solving OT - Cognition Comments: Oriented to person, place, month and year. Partly oriented to situation. Able to follow simple 1 step commands most of the time.                 Following commands: Impaired Following commands impaired: Follows one step commands with increased time     Cueing  General Comments   Cueing Techniques: Verbal cues;Gestural cues              Home Living Family/patient expects to be discharged to:: Private residence Living Arrangements: Alone Available Help at Discharge: Family;Personal care attendant Type of Home: House Home Access: Stairs to enter [employer] of Steps: 2 Entrance Stairs-Rails: Right Home Layout: One level     Bathroom Shower/Tub: Tub/shower unit         Home Equipment: BSC/3in1;Cane - single point;Shower seat;Rolling Environmental Consultant (2 wheels)   Additional Comments:  (Her daughter lives next door and is a financial controller. Pt was admitted to the hospital this tome from SNF rehab)      Prior Functioning/Environment Prior Level of Function : Needs assist;Independent/Modified Independent             Mobility Comments:  (She used a RW as needed for ambulation.) ADLs Comments:   (She was modified independent to independent with ADLs, she does not drive, and she required assist from her daughter or in-home caregiver for household chores.)    OT Problem List: Decreased strength;Decreased activity tolerance;Impaired balance (sitting and/or standing);Decreased knowledge of use of DME or AE;Decreased knowledge of precautions   OT Treatment/Interventions: Self-care/ADL training;Therapeutic exercise;Energy conservation;DME and/or AE instruction;Therapeutic activities;Patient/family education;Balance training      OT Goals(Current goals can be found in the care plan section)   Acute Rehab OT Goals Patient Stated Goal: to be free in my house. OT Goal Formulation: With patient Time For Goal Achievement: 12/24/24 Potential to Achieve Goals: Good ADL Goals Pt Will Perform Upper Body Bathing: with set-up;sitting Pt Will Perform Upper Body Dressing: with set-up;sitting Pt Will Transfer to Toilet: bedside commode;stand pivot transfer;with contact guard assist Additional ADL Goal #1: Pt will perform bed mobility with CGA, in prep for progressive ADL participation.   OT Frequency:  Min 2X/week       AM-PAC OT 6 Clicks Daily Activity     Outcome Measure Help from another person eating meals?: A Little Help from another person taking care of personal grooming?: A Little Help from another person toileting, which includes using toliet, bedpan, or urinal?: A Lot Help from another person bathing (including washing, rinsing, drying)?: A Lot Help from another person to put on and taking off regular upper  body clothing?: A Little Help from another person to put on and taking off regular lower body clothing?: A Lot 6 Click Score: 15   End of Session Equipment Utilized During Treatment: Other (comment) (N/A) Nurse Communication: Mobility status  Activity Tolerance: Other (comment) (Fair tolerance) Patient left: in chair;with call bell/phone within reach;with chair alarm  set  OT Visit Diagnosis: Unsteadiness on feet (R26.81);Muscle weakness (generalized) (M62.81);History of falling (Z91.81);Pain                Time: 8350-8292 OT Time Calculation (min): 18 min Charges:  OT General Charges $OT Visit: 1 Visit OT Evaluation $OT Eval Moderate Complexity: 1 Mod    Eschol Auxier J Harris, OTR/L 12/10/2024, 5:50 PM

## 2024-12-11 DIAGNOSIS — J9601 Acute respiratory failure with hypoxia: Secondary | ICD-10-CM | POA: Diagnosis not present

## 2024-12-11 LAB — GLUCOSE, CAPILLARY
Glucose-Capillary: 104 mg/dL — ABNORMAL HIGH (ref 70–99)
Glucose-Capillary: 356 mg/dL — ABNORMAL HIGH (ref 70–99)
Glucose-Capillary: 310 mg/dL — ABNORMAL HIGH (ref 70–99)
Glucose-Capillary: 62 mg/dL — ABNORMAL LOW (ref 70–99)
Glucose-Capillary: 125 mg/dL — ABNORMAL HIGH (ref 70–99)

## 2024-12-11 NOTE — Progress Notes (Signed)
 " PROGRESS NOTE    Danielle Clarke  FMW:[id number] DOB: 1941/01/24 DOA: 12/08/2024 PCP: [name], MD    Brief Narrative:  84 year old with history of aortic stenosis status post TAVR, osteoarthritis, asthmatic bronchitis, coronary artery disease, hypertension hyperlipidemia, type 1 diabetes on insulin , CKD stage IIIa, multiple compression fractures, history of intracranial bleed and seizure disorder who was recently admitted to the hospital on 12/28 with traumatic right hip fracture underwent ORIF and discharged to SNF on 1/2.  Facility called EMS due to dyspnea and hypoxemia with 88%  at her facility.  No other symptoms of infection. In the emergency room WBC count 18.3, proBNP 12,000.  Troponin 19.  COVID, flu and RSV negative.  Blood glucose 378. Chest radiograph without any acute findings.  CT angiogram of the chest with no PE, no evidence of pneumonia.  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  Admitted due to significant symptoms.  She was 86% on room air and needed 2 L of oxygen.  Subjective: Patient seen and examined.  No new events.  Denies any complaints.  She did not get good sleep due to a lot of disturbance.  She is on room air.  No cough or fever.  Will discontinue antibiotics.   Assessment & Plan:   Acute hypoxemia, suspected acute bronchitis with wheezing: Treated symptomatically.  Asymptomatic today.  Discontinue all antibiotics.  Continue chest physiotherapy and mobility.  Right upper lobe pleural-based tumor: Not likely causing acute hypoxemia. Goal of care discussion, patient is very frail and debilitated.  She does not want to have surgery or she is not a candidate for chemotherapy or other treatment even if this is cancer.  Discussed with her daughter in details.  Monitoring but do not anticipate any biopsy. referral to pulmonary tumor clinic for follow-up however do not anticipate surgery at procedure.  Type 1 diabetes with  hyperglycemia, hypoglycemia due to excess insulin . Blood sugars elevated now after she did not receive any insulin  yesterday. Resume Lantus  6 units twice daily, keep on sliding scale insulin .  Allow regular diet.  Essential hypertension: Blood pressure is stable on metoprolol  25 mg twice daily.  History of TAVR: Stable.  Seizure disorder: On Keppra  250 mg twice daily.  No evidence of new seizure.  Right hip fracture: ORIF 12/29. Weightbearing as tolerated.  Looks stable.  Start PT OT.  Refer back to a SNF. Aspirin  81 mg twice daily for DVT prophylaxis. Adequate pain medications with a bowel regimen.      DVT prophylaxis: SCDs Start: 12/08/24 1740   Code Status: DNR/DNI Family Communication: None today Disposition Plan: Status is: Inpatient Remains inpatient appropriate because: Clinically improved.  Waiting for SNF bed.     Consultants:  None  Procedures:  None  Antimicrobials:  Rocephin  1/12--- 1/14 Keflex  1/14-1/15.     Objective: Vitals:   12/10/24 1912 12/11/24 0515 12/11/24 1045 12/11/24 1049  BP: (!) 119/37 (!) 139/42 (!) 129/40 (!) 129/40  Pulse: 83 79  79  Resp: 18 18    Temp: 98.4 F (36.9 C) 98.3 F (36.8 C)  98.2 F (36.8 C)  TempSrc: Oral Oral  Oral  SpO2:      Weight:      Height:        Intake/Output Summary (Last 24 hours) at 12/11/2024 1131 Last data filed at 12/11/2024 0912 Gross per 24 hour  Intake 290 ml  Output --  Net 290 ml   Danielle Clarke  Weights   12/08/24 1143  Weight: 47 kg    Examination:  General exam: Calm and comfortable.  Frail and debilitated.  Pleasant to conversation but forgetful. Respiratory system: Clear to auscultation. Respiratory effort normal.  No added sounds.  On room air. Cardiovascular system: S1 & S2 heard, RRR. No JVD, murmurs, rubs, gallops or clicks. No pedal edema. Gastrointestinal system: Abdomen is nondistended, soft and nontender. No organomegaly or masses felt. Normal bowel sounds heard. Central  nervous system: Alert and oriented.  Gross generalized weakness.  Forgetful. Right anterior hip incision clean and dry.     Data Reviewed: I have personally reviewed following labs and imaging studies  CBC: Recent Labs  Lab 12/08/24 1218 12/09/24 0606  WBC 18.3* 13.5*  HGB 10.5* 9.2*  HCT 32.6* 27.5*  MCV 83.8 82.3  PLT 484* 413*   Basic Metabolic Panel: Recent Labs  Lab 12/08/24 1218 12/09/24 0606  NA 133* 136  K 4.5 3.6  CL 95* 98  CO2 29 30  GLUCOSE 378* 57*  BUN 29* 23  CREATININE 0.68 0.58  CALCIUM 8.6* 8.7*   GFR: Estimated Creatinine Clearance: 38.9 mL/min (by C-G formula based on SCr of 0.58 mg/dL). Liver Function Tests: Recent Labs  Lab 12/09/24 0606  AST 13*  ALT 6  ALKPHOS 113  BILITOT <0.2  PROT 6.5  ALBUMIN  2.8*   No results for input(s): LIPASE, AMYLASE in the last 168 hours. No results for input(s): AMMONIA in the last 168 hours. Coagulation Profile: No results for input(s): INR, PROTIME in the last 168 hours. Cardiac Enzymes: No results for input(s): CKTOTAL, CKMB, CKMBINDEX, TROPONINI in the last 168 hours. BNP (last 3 results) Recent Labs    12/08/24 1218  PROBNP 1,201.0*   HbA1C: No results for input(s): HGBA1C in the last 72 hours. CBG: Recent Labs  Lab 12/10/24 0718 12/10/24 1145 12/10/24 1713 12/10/24 2033 12/11/24 0809  GLUCAP 399* 381* 403* 255* 104*   Lipid Profile: No results for input(s): CHOL, HDL, LDLCALC, TRIG, CHOLHDL, LDLDIRECT in the last 72 hours. Thyroid  Function Tests: No results for input(s): TSH, T4TOTAL, FREET4, T3FREE, THYROIDAB in the last 72 hours. Anemia Panel: No results for input(s): VITAMINB12, FOLATE, FERRITIN, TIBC, IRON, RETICCTPCT in the last 72 hours. Sepsis Labs: No results for input(s): PROCALCITON, LATICACIDVEN in the last 168 hours.  Recent Results (from the past 240 hours)  Resp panel by RT-PCR (RSV, Flu A&B, Covid) Anterior  Nasal Swab     Status: None   Collection Time: 12/08/24 12:18 PM   Specimen: Anterior Nasal Swab  Result Value Ref Range Status   SARS Coronavirus 2 by RT PCR NEGATIVE NEGATIVE Final    Comment: (NOTE) SARS-CoV-2 target nucleic acids are NOT DETECTED.  The SARS-CoV-2 RNA is generally detectable in upper respiratory specimens during the acute phase of infection. The lowest concentration of SARS-CoV-2 viral copies this assay can detect is 138 copies/mL. A negative result does not preclude SARS-Cov-2 infection and should not be used as the sole basis for treatment or other patient management decisions. A negative result may occur with  improper specimen collection/handling, submission of specimen other than nasopharyngeal swab, presence of viral mutation(s) within the areas targeted by this assay, and inadequate number of viral copies(<138 copies/mL). A negative result must be combined with clinical observations, patient history, and epidemiological information. The expected result is Negative.  Fact Sheet for Patients:  bloggercourse.com  Fact Sheet for Healthcare Providers:  seriousbroker.it  This test is no t yet approved  or cleared by the [place]  FDA and  has been authorized for detection and/or diagnosis of SARS-CoV-2 by FDA under an Emergency Use Authorization (EUA). This EUA will remain  in effect (meaning this test can be used) for the duration of the COVID-19 declaration under Section 564(b)(1) of the Act, 21 U.S.C.section 360bbb-3(b)(1), unless the authorization is terminated  or revoked sooner.       Influenza A by PCR NEGATIVE NEGATIVE Final   Influenza B by PCR NEGATIVE NEGATIVE Final    Comment: (NOTE) The Xpert Xpress SARS-CoV-2/FLU/RSV plus assay is intended as an aid in the diagnosis of influenza from Nasopharyngeal swab specimens and should not be used as a sole basis for treatment. Nasal washings  and aspirates are unacceptable for Xpert Xpress SARS-CoV-2/FLU/RSV testing.  Fact Sheet for Patients: bloggercourse.com  Fact Sheet for Healthcare Providers: seriousbroker.it  This test is not yet approved or cleared by the [place]  FDA and has been authorized for detection and/or diagnosis of SARS-CoV-2 by FDA under an Emergency Use Authorization (EUA). This EUA will remain in effect (meaning this test can be used) for the duration of the COVID-19 declaration under Section 564(b)(1) of the Act, 21 U.S.C. section 360bbb-3(b)(1), unless the authorization is terminated or revoked.     Resp Syncytial Virus by PCR NEGATIVE NEGATIVE Final    Comment: (NOTE) Fact Sheet for Patients: bloggercourse.com  Fact Sheet for Healthcare Providers: seriousbroker.it  This test is not yet approved or cleared by the [place]  FDA and has been authorized for detection and/or diagnosis of SARS-CoV-2 by FDA under an Emergency Use Authorization (EUA). This EUA will remain in effect (meaning this test can be used) for the duration of the COVID-19 declaration under Section 564(b)(1) of the Act, 21 U.S.C. section 360bbb-3(b)(1), unless the authorization is terminated or revoked.  Performed at [facility], [address] [place], [place] [address]   Culture, blood (routine x 2)     Status: None (Preliminary result)   Collection Time: 12/08/24  1:53 PM   Specimen: BLOOD RIGHT ARM  Result Value Ref Range Status   Specimen Description   Final    BLOOD RIGHT ARM Performed at [facility], [address], [place], [place] [address]    Special Requests   Final    BOTTLES DRAWN AEROBIC AND ANAEROBIC Blood Culture results may not be optimal due to an inadequate volume of blood received in culture bottles Performed at [facility], [address]  [place], [place] [address]    Culture   Final    NO GROWTH 3 DAYS Performed at [facility], [address], [place], [place] [address]    Report Status PENDING  Incomplete  Culture, blood (routine x 2)     Status: None (Preliminary result)   Collection Time: 12/08/24  6:29 PM   Specimen: BLOOD  Result Value Ref Range Status   Specimen Description   Final    BLOOD SITE NOT SPECIFIED Performed at [facility], [address] [place], [place] [address]    Special Requests   Final    BOTTLES DRAWN AEROBIC AND ANAEROBIC Blood Culture results may not be optimal due to an inadequate volume of blood received in culture bottles Performed at [facility], [address] [place], [place] [address]    Culture   Final    NO GROWTH 2 DAYS Performed at [facility], [address],  [place], [place] [address]    Report Status PENDING  Incomplete         Radiology Studies: No results found.       Scheduled Meds:  aspirin  EC  81 mg Oral BID   feeding supplement  237 mL Oral BID BM   insulin  aspart  0-15 Units Subcutaneous TID WC   insulin  aspart  0-5 Units Subcutaneous QHS   insulin  glargine-yfgn  6 Units Subcutaneous BID   levETIRAcetam   250 mg Oral BID   metoprolol  tartrate  25 mg Oral BID   montelukast   10 mg Oral QHS   senna-docusate  1 tablet Oral QHS   simvastatin   40 mg Oral QHS   Continuous Infusions:     LOS: 3 days    Time spent: 40 minutes    Renato Applebaum, MD [facility]   "

## 2024-12-11 NOTE — Progress Notes (Signed)
 Physical Therapy Treatment Patient Details Name: Danielle Clarke MRN: [id number] DOB: 1941/08/29 Today's Date: 12/11/2024   History of Present Illness 84 yo female presents to therapy following hospital admission from SNF secondary to dyspnea and hypoxemia and dx of acute respiratory failure. Pt recently in hospital secondary to R hip fx s/p hemiarthroplasty and R elbow I and D with wound closure and d/c to SNF on 1/2. Pt PMH includes but is not limited to: CAD, OA, aortic stenosis s/p TAVR, asthmatic bronchitis, HTN, HLD, DM II, CKD III, compression fx, intracranial bleed, seziure disorder, fall 11/23/2024 R femoral neck fx s/p R hip hemiarthroplasty.    PT Comments  Max assist to transfer bed to bedside commode using bear hug technique. Pt refused to attempt standing with a walker, she refused to sit up in the recliner. Assisted pt with performing RLE ROM exercises. Positioned pt on her L side at end of session for pressure relief of buttocks as she reported soreness there.     If plan is discharge home, recommend the following: A lot of help with bathing/dressing/bathroom;Assistance with cooking/housework;Help with stairs or ramp for entrance;Assist for transportation;A lot of help with walking and/or transfers   Can travel by private vehicle     No  Equipment Recommendations  None recommended by PT    Recommendations for Other Services       Precautions / Restrictions Precautions Precautions: Fall Recall of Precautions/Restrictions: Impaired Restrictions Weight Bearing Restrictions Per Provider Order: No RLE Weight Bearing Per Provider Order: Weight bearing as tolerated     Mobility  Bed Mobility Overal bed mobility: Needs Assistance Bed Mobility: Sit to Supine, Supine to Sit Rolling: Min assist   Supine to sit: Mod assist, HOB elevated Sit to supine: Mod assist (required assist for BLE back onto bed)   General bed mobility comments: assist to raise trunk and pivot hips  to edge of bed; assist for BLEs back into bed    Transfers Overall transfer level: Needs assistance Equipment used: 1 person hand held assist Transfers: Bed to chair/wheelchair/BSC Sit to Stand: Mod assist, From elevated surface Stand pivot transfers: Max assist         General transfer comment: pt refused sit to stand with rolling walker; used bear hug technique for pivot to bedside commode then back to bed, pt moaned in pain in anticipation of movement and with static standing for pericare    Ambulation/Gait                   Stairs             Wheelchair Mobility     Tilt Bed    Modified Rankin (Stroke Patients Only)       Balance Overall balance assessment: Needs assistance Sitting-balance support: Feet unsupported, Bilateral upper extremity supported Sitting balance-Leahy Scale: Fair     Standing balance support: Bilateral upper extremity supported, During functional activity                                Communication Communication Communication: No apparent difficulties  Cognition Arousal: Alert Behavior During Therapy: Anxious   PT - Cognitive impairments: Problem solving, No family/caregiver present to determine baseline, Awareness, Memory, Attention, Initiation                       PT - Cognition Comments: pt fearful and anxious, declined to attempt standing with  walker, yelled out in pain prior to any movement, refused to transfer to recliner, stated she cannot stand but that she has been transferring to bedside commode Following commands: Impaired Following commands impaired: Follows one step commands with increased time    Cueing Cueing Techniques: Verbal cues, Gestural cues  Exercises General Exercises - Lower Extremity Ankle Circles/Pumps: AROM, Right, Supine, 10 reps Heel Slides: Right, Supine, AAROM, 10 reps Hip ABduction/ADduction: AAROM, Right, 10 reps, Supine    General Comments        Pertinent  Vitals/Pain Pain Assessment Breathing: occasional labored breathing, short period of hyperventilation Negative Vocalization: occasional moan/groan, low speech, negative/disapproving quality Facial Expression: sad, frightened, frown Body Language: tense, distressed pacing, fidgeting Consolability: distracted or reassured by voice/touch PAINAD Score: 5 Pain Location: R hip with mobility Pain Descriptors / Indicators: Aching, Discomfort, Grimacing, Guarding Pain Intervention(s): Limited activity within patient's tolerance, Monitored during session, Repositioned    Home Living                          Prior Function            PT Goals (current goals can now be found in the care plan section) Acute Rehab PT Goals Patient Stated Goal: to go home PT Goal Formulation: With patient Time For Goal Achievement: 12/23/24 Potential to Achieve Goals: Fair Progress towards PT goals: Progressing toward goals    Frequency    Min 2X/week      PT Plan      Co-evaluation              AM-PAC PT 6 Clicks Mobility   Outcome Measure  Help needed turning from your back to your side while in a flat bed without using bedrails?: A Lot Help needed moving from lying on your back to sitting on the side of a flat bed without using bedrails?: A Lot Help needed moving to and from a bed to a chair (including a wheelchair)?: A Lot Help needed standing up from a chair using your arms (e.g., wheelchair or bedside chair)?: A Lot Help needed to walk in hospital room?: Total Help needed climbing 3-5 steps with a railing? : Total 6 Click Score: 10    End of Session Equipment Utilized During Treatment: Gait belt Activity Tolerance: Other (comment) (self limiting 2* anxiety) Patient left: with call bell/phone within reach;in bed;with bed alarm set;with nursing/sitter in room Nurse Communication: Mobility status PT Visit Diagnosis: Pain;Unsteadiness on feet (R26.81);History of falling  (Z91.81);Muscle weakness (generalized) (M62.81);Difficulty in walking, not elsewhere classified (R26.2) Pain - Right/Left: Right Pain - part of body: Hip     Time: 8686-8667 PT Time Calculation (min) (ACUTE ONLY): 19 min  Charges:    $Therapeutic Activity: 8-22 mins PT General Charges $$ ACUTE PT VISIT: 1 Visit                     Sylvan Delon Copp PT 12/11/2024  Acute Rehabilitation Services  Office [phone]

## 2024-12-11 NOTE — Plan of Care (Signed)
Patient is progressing towards discharge.

## 2024-12-11 NOTE — Plan of Care (Signed)
  Problem: Education: Goal: Knowledge of General Education information will improve Description: Including pain rating scale, medication(s)/side effects and non-pharmacologic comfort measures Outcome: Progressing   Problem: Clinical Measurements: Goal: Will remain free from infection Outcome: Progressing   Problem: Activity: Goal: Risk for activity intolerance will decrease Outcome: Progressing   Problem: Pain Managment: Goal: General experience of comfort will improve and/or be controlled Outcome: Progressing   Problem: Skin Integrity: Goal: Risk for impaired skin integrity will decrease Outcome: Progressing

## 2024-12-12 DIAGNOSIS — J9601 Acute respiratory failure with hypoxia: Secondary | ICD-10-CM | POA: Diagnosis not present

## 2024-12-12 LAB — GLUCOSE, CAPILLARY
Glucose-Capillary: 338 mg/dL — ABNORMAL HIGH (ref 70–99)
Glucose-Capillary: 285 mg/dL — ABNORMAL HIGH (ref 70–99)
Glucose-Capillary: 145 mg/dL — ABNORMAL HIGH (ref 70–99)
Glucose-Capillary: 101 mg/dL — ABNORMAL HIGH (ref 70–99)

## 2024-12-12 MED ORDER — INSULIN ASPART 100 UNIT/ML IJ SOLN
0.0000 [IU] | Freq: Three times a day (TID) | INTRAMUSCULAR | Status: DC
  Administered 2024-12-12: 3 [IU] via SUBCUTANEOUS
  Administered 2024-12-13: 4 [IU] via SUBCUTANEOUS
  Administered 2024-12-13: 2 [IU] via SUBCUTANEOUS
  Administered 2024-12-13: 3 [IU] via SUBCUTANEOUS
  Administered 2024-12-14: 1 [IU] via SUBCUTANEOUS
  Filled 2024-12-12: qty 4
  Filled 2024-12-12: qty 3
  Filled 2024-12-12: qty 1
  Filled 2024-12-12: qty 3
  Filled 2024-12-12: qty 2

## 2024-12-12 MED ORDER — INSULIN ASPART 100 UNIT/ML IJ SOLN
2.0000 [IU] | Freq: Three times a day (TID) | INTRAMUSCULAR | Status: DC
  Administered 2024-12-12 – 2024-12-14 (×7): 2 [IU] via SUBCUTANEOUS
  Filled 2024-12-12 (×7): qty 2

## 2024-12-12 MED ORDER — INSULIN ASPART 100 UNIT/ML IJ SOLN
0.0000 [IU] | Freq: Every day | INTRAMUSCULAR | Status: DC
  Administered 2024-12-13: 2 [IU] via SUBCUTANEOUS
  Filled 2024-12-12: qty 2

## 2024-12-12 NOTE — TOC Progression Note (Addendum)
 Transition of Care [facility]) - Progression Note    Patient Details  Name: Danielle Clarke MRN: [id number] Date of Birth: January 18, 1941  Transition of Care [facility]) CM/SW Contact  Sonda Manuella Quill, RN Phone Number: 12/12/2024, 1:54 PM  Clinical Narrative:    Ins auth received; Plan Auth ID # [id number], Auth ID # [id number]; start 12/12/24, end 12/16/24; [place], Admissions at [facility] notified; she will update IP CM when pt can admit to facility; spoke w/ pt and dtr [name] in room; they were updated on bed status; awaiting return call from facility.   Expected Discharge Plan: Skilled Nursing Facility Barriers to Discharge: Continued Medical Work up               Expected Discharge Plan and Services In-house Referral: Clinical Social Work Discharge Planning Services: NA Post Acute Care Choice: Skilled Nursing Facility Living arrangements for the past 2 months: Single Family Home                 DME Arranged: N/A DME Agency: NA       HH Arranged: NA HH Agency: NA         Social Drivers of Health (SDOH) Interventions SDOH Screenings   Food Insecurity: No Food Insecurity (11/24/2024)  Housing: Low Risk (11/24/2024)  Transportation Needs: No Transportation Needs (11/24/2024)  Utilities: Patient Unable To Answer (11/24/2024)  Social Connections: Unknown (11/24/2024)  Tobacco Use: Low Risk (12/08/2024)    Readmission Risk Interventions    12/10/2024   12:01 PM 12/09/2024    3:42 PM 04/23/2024   11:16 AM  Readmission Risk Prevention Plan  Transportation Screening Complete  Complete  PCP or Specialist Appt within 5-7 Days Complete Complete Complete  Home Care Screening Complete Complete Complete  Medication Review (RN CM) Complete Complete Complete

## 2024-12-12 NOTE — Plan of Care (Signed)
   Problem: Education: Goal: Knowledge of General Education information will improve Description Including pain rating scale, medication(s)/side effects and non-pharmacologic comfort measures Outcome: Progressing   Problem: Health Behavior/Discharge Planning: Goal: Ability to manage health-related needs will improve Outcome: Progressing

## 2024-12-12 NOTE — Plan of Care (Signed)

## 2024-12-12 NOTE — Progress Notes (Signed)
 " PROGRESS NOTE    Danielle Clarke  FMW:[id number] DOB: Feb 03, 1941 DOA: 12/08/2024 PCP: [name], MD    Brief Narrative:  84 year old with history of aortic stenosis status post TAVR, osteoarthritis, asthmatic bronchitis, coronary artery disease, hypertension hyperlipidemia, type 1 diabetes on insulin , CKD stage IIIa, multiple compression fractures, history of intracranial bleed and seizure disorder who was recently admitted to the hospital on 12/28 with traumatic right hip fracture underwent ORIF and discharged to SNF on 1/2.  Facility called EMS due to dyspnea and hypoxemia with 88%  at her facility.  No other symptoms of infection. In the emergency room WBC count 18.3, proBNP 12,000.  Troponin 19.  COVID, flu and RSV negative.  Blood glucose 378. Chest radiograph without any acute findings.  CT angiogram of the chest with no PE, no evidence of pneumonia.  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  Admitted due to significant symptoms.  She was 86% on room air and needed 2 L of oxygen.  Subjective: Patient was seen and examined.  Pleasant.  She denies any complaints.  She denies any chest pain shortness of breath or cough.  On room air since admission.  Right hip without any pain per the patient, however record indicates that she had been given 1 dose of oxycodone .   Assessment & Plan:   Acute hypoxemia, suspected acute bronchitis with wheezing: Normalized. Treated symptomatically.  Asymptomatic today.  Discontinue all antibiotics.  Continue chest physiotherapy and mobility.  Right upper lobe pleural-based tumor: Not likely causing acute hypoxemia. Patient frail and debilitated.  After discussion with her daughter, recommended not to do biopsy or PET scan. Monitoring but do not anticipate any biopsy. referral to pulmonary tumor clinic for follow-up however do not anticipate surgery at procedure.  Type 1 diabetes with hyperglycemia, hypoglycemia due to  excess insulin . Blood sugars fluctuate.  Elevated today. Continue Lantus  6 units twice daily Start NovoLog  2 units with meals Keep on low-dose sliding scale insulin .  Allow regular diet.  Essential hypertension: Blood pressure is stable on metoprolol  25 mg twice daily.  History of TAVR: Stable.  Seizure disorder: On Keppra  250 mg twice daily.  No evidence of new seizure.  Right hip fracture: ORIF 12/29. Weightbearing as tolerated.  Looks stable.  Start PT OT.  Refer back to a SNF. Aspirin  81 mg twice daily for DVT prophylaxis. Adequate pain medications with a bowel regimen.  Medically stable to transition to a skilled level of care.    DVT prophylaxis: SCDs Start: 12/08/24 1740   Code Status: DNR/DNI Family Communication: None today Disposition Plan: Status is: Inpatient Remains inpatient appropriate because: Clinically improved.  Waiting for SNF bed.     Consultants:  None  Procedures:  None  Antimicrobials:  Rocephin  1/12--- 1/14 Keflex  1/14-1/15.     Objective: Vitals:   12/11/24 1431 12/11/24 2008 12/11/24 2229 12/12/24 0628  BP: (!) 129/47 (!) 119/43 (!) 119/43 (!) 150/49  Pulse: 83 82 82 88  Resp: 15 18  18   Temp: (!) 97.5 F (36.4 C) 98.4 F (36.9 C)  97.9 F (36.6 C)  TempSrc: Oral Oral  Oral  SpO2: 95%     Weight:      Height:        Intake/Output Summary (Last 24 hours) at 12/12/2024 1044 Last data filed at 12/12/2024 0900 Gross per 24 hour  Intake 840 ml  Output 1 ml  Net 839 ml   [employer]  12/08/24 1143  Weight: 47 kg    Examination:  General exam: Calm and comfortable.  Frail.  Is appropriate. Respiratory system: Clear to auscultation. Respiratory effort normal.  No added sounds.  On room air. Cardiovascular system: S1 & S2 heard, RRR. No JVD, murmurs, rubs, gallops or clicks. No pedal edema. Gastrointestinal system: Abdomen is nondistended, soft and nontender. No organomegaly or masses felt. Normal bowel sounds  heard. Central nervous system: Alert and oriented.  Gross generalized weakness.  Forgetful. Right anterior hip incision clean and dry.     Data Reviewed: I have personally reviewed following labs and imaging studies  CBC: Recent Labs  Lab 12/08/24 1218 12/09/24 0606  WBC 18.3* 13.5*  HGB 10.5* 9.2*  HCT 32.6* 27.5*  MCV 83.8 82.3  PLT 484* 413*   Basic Metabolic Panel: Recent Labs  Lab 12/08/24 1218 12/09/24 0606  NA 133* 136  K 4.5 3.6  CL 95* 98  CO2 29 30  GLUCOSE 378* 57*  BUN 29* 23  CREATININE 0.68 0.58  CALCIUM 8.6* 8.7*   GFR: Estimated Creatinine Clearance: 38.9 mL/min (by C-G formula based on SCr of 0.58 mg/dL). Liver Function Tests: Recent Labs  Lab 12/09/24 0606  AST 13*  ALT 6  ALKPHOS 113  BILITOT <0.2  PROT 6.5  ALBUMIN  2.8*   No results for input(s): LIPASE, AMYLASE in the last 168 hours. No results for input(s): AMMONIA in the last 168 hours. Coagulation Profile: No results for input(s): INR, PROTIME in the last 168 hours. Cardiac Enzymes: No results for input(s): CKTOTAL, CKMB, CKMBINDEX, TROPONINI in the last 168 hours. BNP (last 3 results) Recent Labs    12/08/24 1218  PROBNP 1,201.0*   HbA1C: No results for input(s): HGBA1C in the last 72 hours. CBG: Recent Labs  Lab 12/11/24 1133 12/11/24 1615 12/11/24 2108 12/11/24 2211 12/12/24 0800  GLUCAP 356* 310* 62* 125* 338*   Lipid Profile: No results for input(s): CHOL, HDL, LDLCALC, TRIG, CHOLHDL, LDLDIRECT in the last 72 hours. Thyroid  Function Tests: No results for input(s): TSH, T4TOTAL, FREET4, T3FREE, THYROIDAB in the last 72 hours. Anemia Panel: No results for input(s): VITAMINB12, FOLATE, FERRITIN, TIBC, IRON, RETICCTPCT in the last 72 hours. Sepsis Labs: No results for input(s): PROCALCITON, LATICACIDVEN in the last 168 hours.  Recent Results (from the past 240 hours)  Resp panel by RT-PCR (RSV, Flu A&B,  Covid) Anterior Nasal Swab     Status: None   Collection Time: 12/08/24 12:18 PM   Specimen: Anterior Nasal Swab  Result Value Ref Range Status   SARS Coronavirus 2 by RT PCR NEGATIVE NEGATIVE Final    Comment: (NOTE) SARS-CoV-2 target nucleic acids are NOT DETECTED.  The SARS-CoV-2 RNA is generally detectable in upper respiratory specimens during the acute phase of infection. The lowest concentration of SARS-CoV-2 viral copies this assay can detect is 138 copies/mL. A negative result does not preclude SARS-Cov-2 infection and should not be used as the sole basis for treatment or other patient management decisions. A negative result may occur with  improper specimen collection/handling, submission of specimen other than nasopharyngeal swab, presence of viral mutation(s) within the areas targeted by this assay, and inadequate number of viral copies(<138 copies/mL). A negative result must be combined with clinical observations, patient history, and epidemiological information. The expected result is Negative.  Fact Sheet for Patients:  bloggercourse.com  Fact Sheet for Healthcare Providers:  seriousbroker.it  This test is no t yet approved or cleared by the [place]  FDA and  has been authorized for detection and/or diagnosis of SARS-CoV-2 by FDA under an Emergency Use Authorization (EUA). This EUA will remain  in effect (meaning this test can be used) for the duration of the COVID-19 declaration under Section 564(b)(1) of the Act, 21 U.S.C.section 360bbb-3(b)(1), unless the authorization is terminated  or revoked sooner.       Influenza A by PCR NEGATIVE NEGATIVE Final   Influenza B by PCR NEGATIVE NEGATIVE Final    Comment: (NOTE) The Xpert Xpress SARS-CoV-2/FLU/RSV plus assay is intended as an aid in the diagnosis of influenza from Nasopharyngeal swab specimens and should not be used as a sole basis for treatment. Nasal  washings and aspirates are unacceptable for Xpert Xpress SARS-CoV-2/FLU/RSV testing.  Fact Sheet for Patients: bloggercourse.com  Fact Sheet for Healthcare Providers: seriousbroker.it  This test is not yet approved or cleared by the [place]  FDA and has been authorized for detection and/or diagnosis of SARS-CoV-2 by FDA under an Emergency Use Authorization (EUA). This EUA will remain in effect (meaning this test can be used) for the duration of the COVID-19 declaration under Section 564(b)(1) of the Act, 21 U.S.C. section 360bbb-3(b)(1), unless the authorization is terminated or revoked.     Resp Syncytial Virus by PCR NEGATIVE NEGATIVE Final    Comment: (NOTE) Fact Sheet for Patients: bloggercourse.com  Fact Sheet for Healthcare Providers: seriousbroker.it  This test is not yet approved or cleared by the [place]  FDA and has been authorized for detection and/or diagnosis of SARS-CoV-2 by FDA under an Emergency Use Authorization (EUA). This EUA will remain in effect (meaning this test can be used) for the duration of the COVID-19 declaration under Section 564(b)(1) of the Act, 21 U.S.C. section 360bbb-3(b)(1), unless the authorization is terminated or revoked.  Performed at [facility], [address], [place], [place] [address]   Culture, blood (routine x 2)     Status: None (Preliminary result)   Collection Time: 12/08/24  1:53 PM   Specimen: BLOOD RIGHT ARM  Result Value Ref Range Status   Specimen Description   Final    BLOOD RIGHT ARM Performed at [facility], [address], [place], [place] [address]    Special Requests   Final    BOTTLES DRAWN AEROBIC AND ANAEROBIC Blood Culture results may not be optimal due to an inadequate volume of blood received in culture bottles Performed at [facility] [address], [place], [place] [address]    Culture   Final    NO GROWTH 4 DAYS Performed at [facility], [address] [place], [place] [address]    Report Status PENDING  Incomplete  Culture, blood (routine x 2)     Status: None (Preliminary result)   Collection Time: 12/08/24  6:29 PM   Specimen: BLOOD  Result Value Ref Range Status   Specimen Description   Final    BLOOD SITE NOT SPECIFIED Performed at [facility], [address] [place], [place] [address]    Special Requests   Final    BOTTLES DRAWN AEROBIC AND ANAEROBIC Blood Culture results may not be optimal due to an inadequate volume of blood received in culture bottles Performed at [facility], [address], [place], [place] [address]    Culture   Final    NO GROWTH 3 DAYS Performed at [facility], [address] [place], [place] [address]    Report Status PENDING  Incomplete         Radiology Studies: No results found.       Scheduled Meds:  aspirin  EC  81 mg Oral BID   feeding supplement  237 mL Oral BID BM   insulin  aspart  0-5 Units Subcutaneous QHS   insulin  aspart  0-6 Units Subcutaneous TID WC   insulin  aspart  2 Units Subcutaneous TID WC   insulin  glargine-yfgn  6 Units Subcutaneous BID   levETIRAcetam   250 mg Oral BID   metoprolol  tartrate  25 mg Oral BID   montelukast   10 mg Oral QHS   senna-docusate  1 tablet Oral QHS   simvastatin   40 mg Oral QHS   Continuous Infusions:     LOS: 4 days    Time spent: 40 minutes    Renato Applebaum, MD [facility]   "

## 2024-12-12 NOTE — Progress Notes (Signed)
 Occupational Therapy Treatment Patient Details Name: Danielle Clarke MRN: [id number] DOB: 12/04/1940 Today's Date: 12/12/2024   History of present illness 84 yr old female who presented to the hospital from SNF rehab, secondary to dyspnea and hypoxemia. She was found to have acute respiratory failure. Pt was recently in the hospital secondary to R hip fracture s/p hemiarthroplasty and R elbow I&D with wound closure and discharge to SNF rehab on 11/28/24. PMH includes but is not limited to: CAD, OA, aortic stenosis s/p TAVR, asthmatic bronchitis, HTN, HLD, DM II, CKD III, compression fx, intracranial bleed, seziure disorder, fall 11/23/2024 R femoral neck fx s/p R hip hemiarthroplasty.   OT comments  The pt was seen for ADL instruction, functional strengthening, and progression of functional activity. She required mod assist to stand-pivot to the bedside commode using a RW, as well as mod assist for toileting management at bedside commode level. She denied having pain, however was noted to be with unsteadiness in standing. She presented with good participation. Continue OT plan of care to address her functional limitations and impaired ADL performance. Patient will benefit from continued inpatient follow up therapy, <3 hours/day.       If plan is discharge home, recommend the following:  A lot of help with walking and/or transfers;A lot of help with bathing/dressing/bathroom;Assistance with cooking/housework;Direct supervision/assist for medications management;Direct supervision/assist for financial management;Assist for transportation;Help with stairs or ramp for entrance   Equipment Recommendations  Other (comment) (defer to next setting)    Recommendations for Other Services      Precautions / Restrictions Precautions Precautions: Fall Restrictions Weight Bearing Restrictions Per Provider Order: No RLE Weight Bearing Per Provider Order: Weight bearing as tolerated       Mobility Bed  Mobility Overal bed mobility: Needs Assistance Bed Mobility: Sit to Supine, Supine to Sit     Supine to sit: Mod assist, Used rails, HOB elevated Sit to supine: Min assist        Transfers Overall transfer level: Needs assistance Equipment used: Rolling walker (2 wheels) Transfers: Sit to/from Stand, Bed to chair/wheelchair/BSC Sit to Stand: Mod assist, From elevated surface Stand pivot transfers: Mod assist         General transfer comment: pt required cues BLE positioning on the floor, as well as RW advancement, in order to stand-pivot to the the bedside commode     Balance     Sitting balance-Leahy Scale: Fair       Standing balance-Leahy Scale: Poor            ADL either performed or assessed with clinical judgement   ADL Overall ADL's : Needs assistance/impaired     Grooming: Set up;Supervision/safety;Sitting Grooming Details (indicate cue type and reason): The pt performed hand washing seated EOB.         Upper Body Dressing : Minimal assistance;Sitting Upper Body Dressing Details (indicate cue type and reason): The pt required assist to doff a hospital gown then to donn another clean one while seated on the bedside commode. Lower Body Dressing: Maximal assistance;Sitting/lateral leans Lower Body Dressing Details (indicate cue type and reason): increased assist needed for pt to donn her socks Toilet Transfer: Moderate assistance;BSC/3in1;Cueing for sequencing;Cueing for safety Toilet Transfer Details (indicate cue type and reason): Pt required assist for balance in standing, as well as intermittent cues for BLE proximity to the RW, RW placement & to reach back for commode rail, prior to sitting down. Toileting- Clothing Manipulation and Hygiene: Moderate assistance;Cueing for sequencing;Sitting/lateral lean;Sit  to/from stand Toileting - Architect Details (indicate cue type and reason): The pt urinated seated on the bedside commode. She required  SBA for performing seated hygiene. She further required assist for balance/steadying in standing, as well as increased assist for clothing management.             Vision Baseline Vision/History: 1 Wears glasses           Communication Communication Communication: No apparent difficulties   Cognition Arousal: Alert Behavior During Therapy: WFL for tasks assessed/performed       Awareness: Online awareness impaired     Executive functioning impairment (select all impairments): Problem solving OT - Cognition Comments: Oriented to person, place, month and year. Partly oriented to situation. Able to follow simple 1 step commands most of the time.                   Following commands impaired: Follows one step commands with increased time      Cueing   Cueing Techniques: Verbal cues, Gestural cues             Pertinent Vitals/ Pain       Pain Assessment Pain Assessment: No/denies pain   Frequency  Min 2X/week        Progress Toward Goals  OT Goals(current goals can now be found in the care plan section)     Acute Rehab OT Goals Patient Stated Goal: to be free in my house. OT Goal Formulation: With patient Time For Goal Achievement: 12/24/24 Potential to Achieve Goals: Good  Plan         AM-PAC OT 6 Clicks Daily Activity     Outcome Measure   Help from another person eating meals?: None Help from another person taking care of personal grooming?: A Little Help from another person toileting, which includes using toliet, bedpan, or urinal?: A Lot Help from another person bathing (including washing, rinsing, drying)?: A Lot Help from another person to put on and taking off regular upper body clothing?: A Little Help from another person to put on and taking off regular lower body clothing?: A Lot 6 Click Score: 16    End of Session Equipment Utilized During Treatment: Rolling walker (2 wheels);Gait belt  OT Visit Diagnosis: Unsteadiness on  feet (R26.81);Muscle weakness (generalized) (M62.81);History of falling (Z91.81);Other abnormalities of gait and mobility (R26.89)   Activity Tolerance Patient tolerated treatment well   Patient Left in bed;with call bell/phone within reach;with bed alarm set   Nurse Communication Mobility status        Time: 8493-8478 OT Time Calculation (min): 15 min  Charges: OT General Charges $OT Visit: 1 Visit OT Treatments $Self Care/Home Management : 8-22 mins    Delanna JINNY Lesches, OTR/L 12/12/2024, 3:56 PM

## 2024-12-12 NOTE — Care Management Important Message (Signed)
 Important Message  Patient Details IM Letter given. Name: Danielle Clarke MRN: [id number] Date of Birth: 10-Jun-1941   Important Message Given:  Yes - Medicare IM     Melba Ates 12/12/2024, 3:46 PM

## 2024-12-12 NOTE — Inpatient Diabetes Management (Signed)
 Inpatient Diabetes Program Recommendations  AACE/ADA: New Consensus Statement on Inpatient Glycemic Control (2015)  Target Ranges:  Prepandial:   less than 140 mg/dL      Peak postprandial:   less than 180 mg/dL (1-2 hours)      Critically ill patients:  140 - 180 mg/dL   Lab Results  Component Value Date   GLUCAP 338 (H) 12/12/2024   HGBA1C 9.9 (H) 11/23/2024    Review of Glycemic Control  Diabetes history: DM1 Outpatient Diabetes medications: Lantus  8 BID, Novolog  0-12 units TID with meals and HS Current orders for Inpatient glycemic control: Semglee  6 BID, Novolog  0-15 TID with meals and 0-5 HS  Hypo yesterday of 62 after receiving 26 units of Novolog  within < 4 hours  Poor po intake yesterday  Inpatient Diabetes Program Recommendations:    Decrease Novolog  to 0-6 TID with meals and 0-5 HS  Add Novolog  2-3 units TID with meals if eating > 50%  Continue to follow.   Thank you. Shona Brandy, RD, LDN, CDCES Inpatient Diabetes Coordinator [phone]

## 2024-12-13 DIAGNOSIS — J9601 Acute respiratory failure with hypoxia: Secondary | ICD-10-CM | POA: Diagnosis not present

## 2024-12-13 LAB — CULTURE, BLOOD (ROUTINE X 2): Culture: NO GROWTH

## 2024-12-13 LAB — GLUCOSE, CAPILLARY
Glucose-Capillary: 243 mg/dL — ABNORMAL HIGH (ref 70–99)
Glucose-Capillary: 318 mg/dL — ABNORMAL HIGH (ref 70–99)
Glucose-Capillary: 255 mg/dL — ABNORMAL HIGH (ref 70–99)
Glucose-Capillary: 246 mg/dL — ABNORMAL HIGH (ref 70–99)
Glucose-Capillary: 297 mg/dL — ABNORMAL HIGH (ref 70–99)

## 2024-12-13 NOTE — Plan of Care (Signed)

## 2024-12-13 NOTE — TOC Progression Note (Addendum)
 Transition of Care [facility]) - Progression Note    Patient Details  Name: Danielle Clarke MRN: [id number] Date of Birth: [birth year]  Transition of Care [facility]) CM/SW Contact  Sonda Manuella Quill, RN Phone Number: 12/13/2024, 11:35 AM  Clinical Narrative:    Beatris reinhold Chol at [facility] regarding pt admit today; she will call back w/ status of bed availability; Dr Raenelle notified via secure chat.  [phone] call from [facility]; she said pt can admit to facility tomorrow; Dr Raenelle notified via secure chat.  Expected Discharge Plan: Skilled Nursing Facility Barriers to Discharge: Continued Medical Work up               Expected Discharge Plan and Services In-house Referral: Clinical Social Work Discharge Planning Services: NA Post Acute Care Choice: Skilled Nursing Facility Living arrangements for the past 2 months: Single Family Home                 DME Arranged: N/A DME Agency: NA       HH Arranged: NA HH Agency: NA         Social Drivers of Health (SDOH) Interventions SDOH Screenings   Food Insecurity: No Food Insecurity (11/24/2024)  Housing: Low Risk (11/24/2024)  Transportation Needs: No Transportation Needs (11/24/2024)  Utilities: Patient Unable To Answer (11/24/2024)  Social Connections: Unknown (11/24/2024)  Tobacco Use: Low Risk (12/08/2024)    Readmission Risk Interventions    12/10/2024   12:01 PM 12/09/2024    3:42 PM 04/23/2024   11:16 AM  Readmission Risk Prevention Plan  Transportation Screening Complete  Complete  PCP or Specialist Appt within 5-7 Days Complete Complete Complete  Home Care Screening Complete Complete Complete  Medication Review (RN CM) Complete Complete Complete

## 2024-12-13 NOTE — Discharge Summary (Signed)
 Physician Discharge Summary  Danielle Clarke FMW:[id number] DOB: 1941-11-10 DOA: 12/08/2024  PCP: Danielle Velna SAUNDERS, MD  Admit date: 12/08/2024 Discharge date: 12/13/2024  Admitted From: Skilled nursing facility Disposition: Skilled nursing facility  Recommendations for Outpatient Follow-up:  Follow up with PCP in 1-2 weeks after discharge Please obtain BMP/CBC in one week  Discharge Condition: Stable CODE STATUS: DNR/DNI Diet recommendation: Regular diet, carb controlled  Discharge summary: 84 year old with history of aortic stenosis status post TAVR, osteoarthritis, asthmatic bronchitis, coronary artery disease, hypertension hyperlipidemia, type 1 diabetes on insulin , CKD stage IIIa, multiple compression fractures, history of intracranial bleed and seizure disorder who was recently admitted to the hospital on 12/28 with traumatic right hip fracture underwent ORIF and discharged to SNF on 1/2.  Facility called EMS due to dyspnea and hypoxemia with 88%  at her facility.  No other symptoms of infection. In the emergency room WBC count 18.3, proBNP 12,000.  Troponin 19.  COVID, flu and RSV negative.  Blood glucose 378. Chest radiograph without any acute findings.  CT angiogram of the chest with no PE, no evidence of pneumonia.  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  Admitted due to significant symptoms.  She was 86% on room air and needed 2 L of oxygen.  Patient is on room air now.   Acute hypoxemia, suspected acute bronchitis with wheezing: Normalized.  On room air now. Treated symptomatically.  Asymptomatic today.  Continue chest physiotherapy and mobility.   Right upper lobe pleural-based tumor: Not likely causing acute hypoxemia. Patient frail and debilitated.  After discussion with her daughter, recommended not to do biopsy or PET scan. Monitoring but do not anticipate any biopsy. referral to pulmonary tumor clinic for follow-up however do not anticipate  surgery or any procedure.   Type 1 diabetes with hyperglycemia, hypoglycemia due to excess insulin . Blood sugars fluctuate.  Blood sugars elevated today.  Avoid hypoglycemia. Continue Lantus  8 units twice daily. Continue sliding scale. Allow regular diet due to her frailty. Avoid hypoglycemia.  Level of hyperglycemia may be acceptable to avoid hypoglycemia.   Essential hypertension: Blood pressure is stable on metoprolol  25 mg twice daily.   History of TAVR: Stable.   Seizure disorder: On Keppra  250 mg twice daily.  No evidence of new seizure.   Right hip fracture: ORIF 12/29. Weightbearing as tolerated.  Looks stable.  Start PT OT.  Refer back to a SNF. Aspirin  81 mg twice daily for DVT prophylaxis. Currently pain-free.   Medically stable to transition to a skilled level of care.    Discharge Diagnoses:  Principal Problem:   Acute respiratory failure with hypoxia (HCC) Active Problems:   Diabetes mellitus type 1 (HCC)   Essential hypertension, benign   CKD (chronic kidney disease), stage III (HCC)   Dyslipidemia   S/P TAVR (transcatheter aortic valve replacement)   Seizures (HCC)   Mass of upper lobe of right lung    Discharge Instructions  Discharge Instructions     Diet Carb Modified   Complete by: As directed    Diet general   Complete by: As directed    Increase activity slowly   Complete by: As directed    No wound care   Complete by: As directed    Pulmonary Visit   Complete by: As directed    Reason for referral: Lung Mass/Lung Nodule   Does the patients CT scan have any urgent finds? (lung mass (3+cm), suspected metastatic disease, nodules >  6mm with high-risk characteristics such as spiculation, pleural tenting, adenopathy, or non-calcified nodule >51mm): No      Allergies as of 12/13/2024       Reactions   Augmentin [amoxicillin-pot Clavulanate] Anaphylaxis   Ceclor [cefaclor] Anaphylaxis   Tolerated cephalexin  in 2018   Firvanq   [vancomycin ] Anaphylaxis   Sumycin [tetracycline] Anaphylaxis   Accupril  [quinapril  Hcl] Other (See Comments)   Hyperkalemia   Bactrim  [sulfamethoxazole -trimethoprim ] Other (See Comments)   CKD - decreased kidney function   Fosamax [alendronate Sodium] Other (See Comments)   Unknown reaction   Nsaids Other (See Comments)   CKD   Deltasone [prednisone] Other (See Comments)   Unknown reaction        Medication List     STOP taking these medications    losartan  25 MG tablet Commonly known as: COZAAR    oxyCODONE  5 MG immediate release tablet Commonly known as: Oxy IR/ROXICODONE    senna-docusate 8.6-50 MG tablet Commonly known as: Senokot-S       TAKE these medications    acetaminophen  500 MG tablet Commonly known as: TYLENOL  Take 500 mg by mouth every 6 (six) hours as needed for moderate pain (pain score 4-6).   aspirin  EC 81 MG tablet Take 1 tablet (81 mg total) by mouth 2 (two) times daily. Swallow whole.   docusate sodium  100 MG capsule Commonly known as: COLACE Take 1 capsule (100 mg total) by mouth 2 (two) times daily.   insulin  lispro 100 UNIT/ML injection Commonly known as: HUMALOG Inject 0-12 Units into the skin See admin instructions. Inject 0-12 units subcutaneously before meals and at bedtime per sliding scale: < 70 : contact provider 70-200 : 0 units 201-250 : 2 units 251-300 : 4 units 301-350 : 6 units 351-400 : 8 units 401-450 : 10 units 451-600 : 12 units - recheck blood sugar in 2 hours, if BS > 350 or < 100, notify provider.   Lantus  SoloStar 100 UNIT/ML Solostar Pen Generic drug: insulin  glargine Inject 8 Units into the skin 2 (two) times daily.   levETIRAcetam  250 MG tablet Commonly known as: KEPPRA  Take 1 tablet (250 mg total) by mouth 2 (two) times daily.   methocarbamol  500 MG tablet Commonly known as: ROBAXIN  Take 1 tablet (500 mg total) by mouth every 8 (eight) hours as needed for muscle spasms.   metoprolol  tartrate 25 MG  tablet Commonly known as: LOPRESSOR  Take 1 tablet (25 mg total) by mouth 2 (two) times daily. What changed:  how much to take additional instructions   montelukast  10 MG tablet Commonly known as: SINGULAIR  Take 10 mg by mouth at bedtime.   simvastatin  40 MG tablet Commonly known as: ZOCOR  Take 1 tablet (40 mg total) by mouth at bedtime.   traZODone  50 MG tablet Commonly known as: DESYREL  Take 0.5 tablets (25 mg total) by mouth at bedtime as needed for sleep.        Contact information for after-discharge care     Destination     [facility], [place] .   Service: Skilled Nursing Contact information: [address] [place]  [address] [phone]                    Allergies[1]  Consultations: None   Procedures/Studies: CT Angio Chest PE W/Cm &/Or Wo Cm Result Date: 12/08/2024 EXAM: CTA of the Chest with contrast for PE 12/08/2024 01:37:31 PM TECHNIQUE: CTA of the chest was performed after the administration of 75 mL Omnipaque  350  intravenous contrast. Multiplanar reformatted images are provided for review. MIP images are provided for review. Automated exposure control, iterative reconstruction, and/or weight based adjustment of the mA/kV was utilized to reduce the radiation dose to as low as reasonably achievable. COMPARISON: CT chest 04/15/2024. CLINICAL HISTORY: Pulmonary embolism (PE) suspected, high prob. FINDINGS: PULMONARY ARTERIES: Pulmonary arteries are adequately opacified for evaluation. No pulmonary embolism. Main pulmonary artery is normal in caliber. MEDIASTINUM: The heart and pericardium demonstrate no acute abnormality. Aortic valve replacement. There is no acute abnormality of the thoracic aorta. LYMPH NODES: No mediastinal, hilar or axillary lymphadenopathy. LUNGS AND PLEURA: In the medial right upper lobe along the pleural surface there is enlargement soft tissue density measuring 2.4 x 2.0 cm (image 17 of series 7).  This is essentially new from comparison CT 04/15/2024. No focal consolidation or pulmonary edema. No pleural effusion or pneumothorax. UPPER ABDOMEN: Hypoperfusion to the central spleen measuring 5.2 x 3.8 cm. SOFT TISSUES AND BONES: There are several healed posterior rib fractures on the left. Multiple levels of degenerative change and scoliosis in the upper thoracic spine. Anterior cervical spine fusion. No acute soft tissue abnormality. IMPRESSION: 1. No pulmonary embolism. 2. New 2.4 x 2.0 cm pleural-based right upper lobe mass, recommend PET/CT and/or tissue sampling. 3. Splenic perfusion defect. Indeterminate finding. Recommended additional PET scan if obtained. 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 LUNGS AND PLEURA: No focal pulmonary opacity. No pleural effusion. No pneumothorax. HEART AND MEDIASTINUM: Aortic calcification. Aortic valve prosthesis. No acute abnormality of the cardiac and mediastinal silhouettes. BONES AND SOFT TISSUES: Right shoulder arthroplasty changes. Old healed fracture deformity of left proximal humerus. Levoscoliosis of thoracolumbar spine. Chronic right posterior rib fracture deformities. IMPRESSION: 1. No acute findings. Electronically signed by: Waddell Calk MD MD 12/08/2024 02:08 PM EST RP Workstation: [id number]   DG HIP UNILAT W OR W/O PELVIS 2-3 VIEWS RIGHT Result Date: 11/24/2024 CLINICAL DATA:  Status post hip hemiarthroplasty. EXAM: DG HIP (WITH OR WITHOUT PELVIS) 2-3V RIGHT COMPARISON:  Preoperative imaging FINDINGS: Right hip hemiarthroplasty in expected alignment. Cerclage wire about the femoral stem. No periprosthetic lucency or fracture. Recent postsurgical change  includes air and edema in the soft tissues. IMPRESSION: Right hip hemiarthroplasty without immediate postoperative complication. Electronically Signed   By: Andrea Gasman M.D.   On: 11/24/2024 18:34   DG HIP UNILAT WITH PELVIS 1V RIGHT Result Date: 11/24/2024 CLINICAL DATA:  Elective surgery. EXAM: DG HIP (WITH OR WITHOUT PELVIS) 1V RIGHT COMPARISON:  Preoperative imaging FINDINGS: Seven fluoroscopic spot views of the pelvis and right hip obtained in the operating room. Sequential images during hip arthroplasty. Cerclage wire about the femoral stem. Fluoroscopy time 9.6 seconds. Dose 0.5792 mGy. IMPRESSION: Intraoperative fluoroscopy during right hip arthroplasty. Electronically Signed   By: Andrea Gasman M.D.   On: 11/24/2024 18:34   DG C-Arm 1-60 Min-No Report Result Date: 11/24/2024 Fluoroscopy was utilized by the requesting physician.  No radiographic interpretation.   DG C-Arm 1-60 Min-No Report Result Date: 11/24/2024 Fluoroscopy was utilized by the requesting physician.  No radiographic interpretation.   CT Head Wo Contrast Result Date: 11/23/2024 EXAM: CT HEAD WITHOUT CONTRAST 11/23/2024 09:30:15 AM TECHNIQUE: CT of the head was performed without the administration of intravenous  contrast. Automated exposure control, iterative reconstruction, and/or weight based adjustment of the mA/kV was utilized to reduce the radiation dose to as low as reasonably achievable. COMPARISON: CT Head 04/15/2024, Brain MRI 04/16/2024, CT cervical spine reported separately today. CLINICAL HISTORY: 84 year old female with minor head trauma after a fall at 8:15 PM. FINDINGS: BRAIN AND VENTRICLES: No acute hemorrhage. No evidence of acute infarct. No hydrocephalus. No extra-axial collection. No mass effect or midline shift. Pronounced chronic anterior left frontal lobe and anterior bitemporal encephalomalacia is stable. Stable brain volume. Stable ex vacuo ventricular enlargement. Stable gray white  differentiation. Chronic dural calcification incidentally noted. No suspicious intracranial vascular hyperdensity. Advanced calcified atherosclerosis at the skull base. ORBITS: No acute abnormality. SINUSES: Chronic paranasal sinusitis with areas of advanced mucoperiosteal thickening is stable. SOFT TISSUES AND SKULL: No acute soft tissue abnormality. No skull fracture. Tympanic cavities and mastoids remain well aerated. IMPRESSION: 1. No acute traumatic injury identified. 2. Stable chronic left frontal and bitemporal encephalomalacia. 3. Stable chronic paranasal sinus disease. Electronically signed by: Helayne Hurst MD 11/23/2024 10:00 AM EST RP Workstation: [id number]   CT Cervical Spine Wo Contrast Result Date: 11/23/2024 EXAM: CT CERVICAL SPINE WITHOUT CONTRAST 11/23/2024 09:30:15 AM TECHNIQUE: CT of the cervical spine was performed without the administration of intravenous contrast. Multiplanar reformatted images are provided for review. Automated exposure control, iterative reconstruction, and/or weight-based adjustment of the mA/kV was utilized to reduce the radiation dose to as low as reasonably achievable. COMPARISON: CT head reported separately today, previous cervical spine CT on 02/06/2019, and CTA head and neck on 04/15/2024. CLINICAL HISTORY: 84 year old female, fell at 8:15 PM. 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 No significant change in mild distraction and displacement since 2020, and the chronic odontoid fragment does appear solidly fused with the anterior C1 ring. 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 However, chronic anterior cervical fusion hardware spanning from C3 to C7 now demonstrates severe loosening and bone resorption at  the C7 vertebral body level (series 6, image 44) and C6-C7 pseudarthrosis. Developing degenerative bilateral C6-C7 facet ankylosis, which is new from 2020. Partially visible upper thoracic spinal hyperostosis and interbody ankylosis also. C3 cortical screw loosening is less pronounced but also substantially progressed since 2020 (series 2, image 40). No superimposed acute osseous abnormality identified in the cervical spine. DEGENERATIVE CHANGES: No strong CT evidence of cervical spinal stenosis. SOFT TISSUES: Prevertebral soft tissue thickening at the cervicothoracic junction appears chronic but has progressed (series 8, image 67), but no C7 or T1 endplate erosion is associated. Stable lung apex ventilation. Partially visible left chest cardiac pacemaker device. Calcified cervical carotid artery atherosclerosis. IMPRESSION: 1. Since May 2025 substantial hardware loosening and bone resorption at C7-T1 , also C3. Associated nonspecific prevertebral soft tissue swelling there. But but no strong CT evidence of discitis osteomyelitis. Associated C6-C7 pseudarthrosis, superimposed on otherwise solid C2 through C6 spinal fusion. 2. No superimposed acute traumatic injury identified in the cervical spine. 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 Electronically signed by: Helayne Hurst MD 11/23/2024 09:58 AM EST RP Workstation: [id number]   DG Hip Unilat With Pelvis 2-3 Views Right Result Date: 11/23/2024 CLINICAL DATA:  Fall.  Right hip pain. EXAM: DG HIP (WITH OR WITHOUT PELVIS) 2-3V RIGHT COMPARISON:  None Available. FINDINGS: Acute, mildly displaced right femoral  neck fracture is seen. No evidence of hip dislocation. No pelvic fracture identified. Mild pubic symphysis degenerative changes are seen. Peripheral vascular calcification also noted. IMPRESSION: Acute, mildly displaced right femoral neck fracture. Electronically Signed   By: [name] DELENA Kil M.D.   On: 11/23/2024 09:43   (Echo, Carotid, EGD,  Colonoscopy, ERCP)    Subjective: Patient seen and examined.  Pleasant and forgetful.  Denies any complaints.  Denies any pain.  On room air.   Discharge Exam: Vitals:   12/12/24 2014 12/13/24 0513  BP: (!) 121/46 (!) 132/45  Pulse: 77 80  Resp: 16 (!) 22  Temp: 98.7 F (37.1 C) 98.3 F (36.8 C)  SpO2: 93% 93%   Vitals:   12/12/24 0628 12/12/24 1433 12/12/24 2014 12/13/24 0513  BP: (!) 150/49 (!) 115/41 (!) 121/46 (!) 132/45  Pulse: 88 74 77 80  Resp: 18 15 16  (!) 22  Temp: 97.9 F (36.6 C) 98.1 F (36.7 C) 98.7 F (37.1 C) 98.3 F (36.8 C)  TempSrc: Oral Oral Oral Oral  SpO2:   93% 93%  Weight:      Height:        General: Pt is alert, awake, not in acute distress Frail and debilitated .  Patient is pleasant.  Forgetful.  On room air. Cardiovascular: RRR, pacemaker in place.  S1/S2 +, no rubs, no gallops Respiratory: CTA bilaterally, no wheezing, no rhonchi Abdominal: Soft, NT, ND, bowel sounds + Extremities: no edema, no cyanosis.  Patient has right hip anterior incision which is clean and dry.  Nontender.    The results of significant diagnostics from this hospitalization (including imaging, microbiology, ancillary and laboratory) are listed below for reference.     Microbiology: Recent Results (from the past 240 hours)  Resp panel by RT-PCR (RSV, Flu A&B, Covid) Anterior Nasal Swab     Status: None   Collection Time: 12/08/24 12:18 PM   Specimen: Anterior Nasal Swab  Result Value Ref Range Status   SARS Coronavirus 2 by RT PCR NEGATIVE NEGATIVE Final    Comment: (NOTE) SARS-CoV-2 target nucleic acids are NOT DETECTED.  The SARS-CoV-2 RNA is generally detectable in upper respiratory specimens during the acute phase of infection. The lowest concentration of SARS-CoV-2 viral copies this assay can detect is 138 copies/mL. A negative result does not preclude SARS-Cov-2 infection and should not be used as the sole basis for treatment or other patient  management decisions. A negative result may occur with  improper specimen collection/handling, submission of specimen other than nasopharyngeal swab, presence of viral mutation(s) within the areas targeted by this assay, and inadequate number of viral copies(<138 copies/mL). A negative result must be combined with clinical observations, patient history, and epidemiological information. The expected result is Negative.  Fact Sheet for Patients:  bloggercourse.com  Fact Sheet for Healthcare Providers:  seriousbroker.it  This test is no t yet approved or cleared by the [place]  FDA and  has been authorized for detection and/or diagnosis of SARS-CoV-2 by FDA under an Emergency Use Authorization (EUA). This EUA will remain  in effect (meaning this test can be used) for the duration of the COVID-19 declaration under Section 564(b)(1) of the Act, 21 U.S.C.section 360bbb-3(b)(1), unless the authorization is terminated  or revoked sooner.       Influenza A by PCR NEGATIVE NEGATIVE Final   Influenza B by PCR NEGATIVE NEGATIVE Final    Comment: (NOTE) The Xpert Xpress SARS-CoV-2/FLU/RSV plus assay is intended as an aid in the diagnosis  of influenza from Nasopharyngeal swab specimens and should not be used as a sole basis for treatment. Nasal washings and aspirates are unacceptable for Xpert Xpress SARS-CoV-2/FLU/RSV testing.  Fact Sheet for Patients: bloggercourse.com  Fact Sheet for Healthcare Providers: seriousbroker.it  This test is not yet approved or cleared by the [place]  FDA and has been authorized for detection and/or diagnosis of SARS-CoV-2 by FDA under an Emergency Use Authorization (EUA). This EUA will remain in effect (meaning this test can be used) for the duration of the COVID-19 declaration under Section 564(b)(1) of the Act, 21 U.S.C. section 360bbb-3(b)(1),  unless the authorization is terminated or revoked.     Resp Syncytial Virus by PCR NEGATIVE NEGATIVE Final    Comment: (NOTE) Fact Sheet for Patients: bloggercourse.com  Fact Sheet for Healthcare Providers: seriousbroker.it  This test is not yet approved or cleared by the [place]  FDA and has been authorized for detection and/or diagnosis of SARS-CoV-2 by FDA under an Emergency Use Authorization (EUA). This EUA will remain in effect (meaning this test can be used) for the duration of the COVID-19 declaration under Section 564(b)(1) of the Act, 21 U.S.C. section 360bbb-3(b)(1), unless the authorization is terminated or revoked.  Performed at [facility], [address], [place], [place] [address]   Culture, blood (routine x 2)     Status: None   Collection Time: 12/08/24  1:53 PM   Specimen: BLOOD RIGHT ARM  Result Value Ref Range Status   Specimen Description   Final    BLOOD RIGHT ARM Performed at [facility], [address] [place], [place] [address]    Special Requests   Final    BOTTLES DRAWN AEROBIC AND ANAEROBIC Blood Culture results may not be optimal due to an inadequate volume of blood received in culture bottles Performed at [facility], [address], [place], [place] [address]    Culture   Final    NO GROWTH 5 DAYS Performed at [facility], [address] [place], [place] [address]    Report Status 12/13/2024 FINAL  Final  Culture, blood (routine x 2)     Status: None (Preliminary result)   Collection Time: 12/08/24  6:29 PM   Specimen: BLOOD  Result Value Ref Range Status   Specimen Description   Final    BLOOD SITE NOT SPECIFIED Performed at [facility], [address], [place], [place] [address]    Special Requests   Final    BOTTLES DRAWN AEROBIC AND ANAEROBIC Blood Culture results may not be optimal due to an inadequate  volume of blood received in culture bottles Performed at [facility], [address] [place], [place] [address]    Culture   Final    NO GROWTH 4 DAYS Performed at [facility], [address] [place], [place] [address]    Report Status PENDING  Incomplete     Labs: BNP (last 3 results) Recent Labs    04/15/24 1526  BNP 264.9*   Basic Metabolic Panel: Recent Labs  Lab 12/08/24 1218 12/09/24 0606  NA 133* 136  K 4.5 3.6  CL 95* 98  CO2 29 30  GLUCOSE 378* 57*  BUN 29* 23  CREATININE 0.68 0.58  CALCIUM 8.6* 8.7*   Liver Function Tests: Recent Labs  Lab 12/09/24 0606  AST 13*  ALT 6  ALKPHOS 113  BILITOT <0.2  PROT 6.5  ALBUMIN  2.8*  No results for input(s): LIPASE, AMYLASE in the last 168 hours. No results for input(s): AMMONIA in the last 168 hours. CBC: Recent Labs  Lab 12/08/24 1218 12/09/24 0606  WBC 18.3* 13.5*  HGB 10.5* 9.2*  HCT 32.6* 27.5*  MCV 83.8 82.3  PLT 484* 413*   Cardiac Enzymes: No results for input(s): CKTOTAL, CKMB, CKMBINDEX, TROPONINI in the last 168 hours. BNP: Invalid input(s): POCBNP CBG: Recent Labs  Lab 12/12/24 1209 12/12/24 1645 12/12/24 2051 12/13/24 0729 12/13/24 1130  GLUCAP 285* 145* 101* 243* 318*   D-Dimer No results for input(s): DDIMER in the last 72 hours. Hgb A1c No results for input(s): HGBA1C in the last 72 hours. Lipid Profile No results for input(s): CHOL, HDL, LDLCALC, TRIG, CHOLHDL, LDLDIRECT in the last 72 hours. Thyroid  function studies No results for input(s): TSH, T4TOTAL, T3FREE, THYROIDAB in the last 72 hours.  Invalid input(s): FREET3 Anemia work up No results for input(s): VITAMINB12, FOLATE, FERRITIN, TIBC, IRON, RETICCTPCT in the last 72 hours. Urinalysis    Component Value Date/Time   COLORURINE YELLOW 02/02/2023 0950   APPEARANCEUR CLEAR 02/02/2023 0950   LABSPEC 1.021 02/02/2023 0950   PHURINE  5.0 02/02/2023 0950   GLUCOSEU >=500 (A) 02/02/2023 0950   HGBUR NEGATIVE 02/02/2023 0950   BILIRUBINUR NEGATIVE 02/02/2023 0950   KETONESUR 5 (A) 02/02/2023 0950   PROTEINUR NEGATIVE 02/02/2023 0950   UROBILINOGEN 0.2 10/07/2014 1808   NITRITE NEGATIVE 02/02/2023 0950   LEUKOCYTESUR NEGATIVE 02/02/2023 0950   Sepsis Labs Recent Labs  Lab 12/08/24 1218 12/09/24 0606  WBC 18.3* 13.5*   Microbiology Recent Results (from the past 240 hours)  Resp panel by RT-PCR (RSV, Flu A&B, Covid) Anterior Nasal Swab     Status: None   Collection Time: 12/08/24 12:18 PM   Specimen: Anterior Nasal Swab  Result Value Ref Range Status   SARS Coronavirus 2 by RT PCR NEGATIVE NEGATIVE Final    Comment: (NOTE) SARS-CoV-2 target nucleic acids are NOT DETECTED.  The SARS-CoV-2 RNA is generally detectable in upper respiratory specimens during the acute phase of infection. The lowest concentration of SARS-CoV-2 viral copies this assay can detect is 138 copies/mL. A negative result does not preclude SARS-Cov-2 infection and should not be used as the sole basis for treatment or other patient management decisions. A negative result may occur with  improper specimen collection/handling, submission of specimen other than nasopharyngeal swab, presence of viral mutation(s) within the areas targeted by this assay, and inadequate number of viral copies(<138 copies/mL). A negative result must be combined with clinical observations, patient history, and epidemiological information. The expected result is Negative.  Fact Sheet for Patients:  bloggercourse.com  Fact Sheet for Healthcare Providers:  seriousbroker.it  This test is no t yet approved or cleared by the [place]  FDA and  has been authorized for detection and/or diagnosis of SARS-CoV-2 by FDA under an Emergency Use Authorization (EUA). This EUA will remain  in effect (meaning this test can be  used) for the duration of the COVID-19 declaration under Section 564(b)(1) of the Act, 21 U.S.C.section 360bbb-3(b)(1), unless the authorization is terminated  or revoked sooner.       Influenza A by PCR NEGATIVE NEGATIVE Final   Influenza B by PCR NEGATIVE NEGATIVE Final    Comment: (NOTE) The Xpert Xpress SARS-CoV-2/FLU/RSV plus assay is intended as an aid in the diagnosis of influenza from Nasopharyngeal swab specimens and should not be used as a sole basis for treatment. Nasal washings and aspirates  are unacceptable for Xpert Xpress SARS-CoV-2/FLU/RSV testing.  Fact Sheet for Patients: bloggercourse.com  Fact Sheet for Healthcare Providers: seriousbroker.it  This test is not yet approved or cleared by the [place]  FDA and has been authorized for detection and/or diagnosis of SARS-CoV-2 by FDA under an Emergency Use Authorization (EUA). This EUA will remain in effect (meaning this test can be used) for the duration of the COVID-19 declaration under Section 564(b)(1) of the Act, 21 U.S.C. section 360bbb-3(b)(1), unless the authorization is terminated or revoked.     Resp Syncytial Virus by PCR NEGATIVE NEGATIVE Final    Comment: (NOTE) Fact Sheet for Patients: bloggercourse.com  Fact Sheet for Healthcare Providers: seriousbroker.it  This test is not yet approved or cleared by the [place]  FDA and has been authorized for detection and/or diagnosis of SARS-CoV-2 by FDA under an Emergency Use Authorization (EUA). This EUA will remain in effect (meaning this test can be used) for the duration of the COVID-19 declaration under Section 564(b)(1) of the Act, 21 U.S.C. section 360bbb-3(b)(1), unless the authorization is terminated or revoked.  Performed at [facility], [address], [place], [place] [address]   Culture, blood (routine x 2)      Status: None   Collection Time: 12/08/24  1:53 PM   Specimen: BLOOD RIGHT ARM  Result Value Ref Range Status   Specimen Description   Final    BLOOD RIGHT ARM Performed at [facility], [address], [place], [place] [address]    Special Requests   Final    BOTTLES DRAWN AEROBIC AND ANAEROBIC Blood Culture results may not be optimal due to an inadequate volume of blood received in culture bottles Performed at [facility], [address] [place], [place] [address]    Culture   Final    NO GROWTH 5 DAYS Performed at [facility], [address] [place], [place] [address]    Report Status 12/13/2024 FINAL  Final  Culture, blood (routine x 2)     Status: None (Preliminary result)   Collection Time: 12/08/24  6:29 PM   Specimen: BLOOD  Result Value Ref Range Status   Specimen Description   Final    BLOOD SITE NOT SPECIFIED Performed at [facility], [address], [place], [place] [address]    Special Requests   Final    BOTTLES DRAWN AEROBIC AND ANAEROBIC Blood Culture results may not be optimal due to an inadequate volume of blood received in culture bottles Performed at [facility], [address], [place], [place] [address]    Culture   Final    NO GROWTH 4 DAYS Performed at [facility], [address], [place], [place] [address]    Report Status PENDING  Incomplete     Time coordinating discharge: 35 minutes  SIGNED:   Renato Applebaum, MD  [facility] 12/13/2024, 12:14 PM     [1]  Allergies Allergen Reactions   Augmentin [Amoxicillin-Pot Clavulanate] Anaphylaxis   Ceclor [Cefaclor] Anaphylaxis    Tolerated cephalexin  in 2018   Firvanq  [Vancomycin ] Anaphylaxis   Sumycin [Tetracycline] Anaphylaxis   Accupril  [Quinapril  Hcl] Other (See Comments)    Hyperkalemia   Bactrim  [Sulfamethoxazole -Trimethoprim ] Other (See Comments)    CKD - decreased kidney function   Fosamax  [Alendronate Sodium] Other (See Comments)    Unknown reaction   Nsaids Other (See Comments)    CKD   Deltasone [Prednisone] Other (See Comments)  Unknown reaction

## 2024-12-13 NOTE — Progress Notes (Signed)
" °   12/12/24 2152  Provider Notification  Provider Name/Title Dr. Lavanda Horns, NP  Date Provider Notified 12/13/24  Time Provider Notified 0404  Method of Notification Page  Notification Reason Red med refusal--> Pt refused all of her night time meds last night. Pt is alert and fully oriented x 4.   Provider response No new orders;Evaluate remotely  Date of Provider Response 12/13/24  Time of Provider Response 0404   Wendi Dash, RN  "

## 2024-12-14 LAB — CULTURE, BLOOD (ROUTINE X 2): Culture: NO GROWTH

## 2024-12-14 LAB — GLUCOSE, CAPILLARY
Glucose-Capillary: 89 mg/dL (ref 70–99)
Glucose-Capillary: 163 mg/dL — ABNORMAL HIGH (ref 70–99)

## 2024-12-14 NOTE — Progress Notes (Signed)
 Patient seen and examined.  Looks comfortable.  She was eating breakfast.  Denies any pain.  Denies any chest pain or shortness of breath.  She is on room air.  I told her she is going to a skilled nursing facility and she was agreeable with the plan.  She tells me she has not been out walking much.  Discharge summary reviewed, patient remains overall stable to transition to a skilled nursing facility.  No charge visit.

## 2024-12-14 NOTE — TOC Transition Note (Signed)
 Transition of Care [facility]) - Discharge Note   Patient Details  Name: Danielle Clarke MRN: [id number] Date of Birth: 1941/04/11  Transition of Care [facility]) CM/SW Contact:  Sonda Manuella Quill, RN Phone Number: 12/14/2024, 11:27 AM   Clinical Narrative:    D/C orders received; spoke w/ [place] at [facility]; she gave RM # [phone], call report # [phone]; transport by [facility]; pt and dtr [name] Dutton [phone]) notified and agreed to d/c plan; [facility] called for transport at 1124; spoke w/ operator # [phone]; D/C summary and SNF transfer report sent via hub; no IP CM needs.   Final next level of care: Skilled Nursing Facility Barriers to Discharge: No Barriers Identified   Patient Goals and CMS Choice Patient states their goals for this hospitalization and ongoing recovery are:: To return to [facility]   Choice offered to / list presented to : NA [facility] ownership interest in [facility].provided to:: Parent NA    Discharge Placement              Patient chooses bed at: [facility] Patient to be transferred to facility by: [facility] Name of family member notified: Shalane Florendo (dtr) [phone] Patient and family notified of of transfer: 12/14/24  Discharge Plan and Services Additional resources added to the After Visit Summary for   In-house Referral: Clinical Social Work Discharge Planning Services: NA Post Acute Care Choice: Skilled Nursing Facility          DME Arranged: N/A DME Agency: NA       HH Arranged: NA HH Agency: NA        Social Drivers of Health (SDOH) Interventions SDOH Screenings   Food Insecurity: No Food Insecurity (11/24/2024)  Housing: Low Risk (11/24/2024)  Transportation Needs: No Transportation Needs (11/24/2024)  Utilities: Patient Unable To Answer (11/24/2024)  Social Connections: Unknown (11/24/2024)  Tobacco Use: Low Risk (12/08/2024)     Readmission Risk Interventions    12/10/2024   12:01 PM 12/09/2024    3:42 PM 04/23/2024    11:16 AM  Readmission Risk Prevention Plan  Transportation Screening Complete  Complete  PCP or Specialist Appt within 5-7 Days Complete Complete Complete  Home Care Screening Complete Complete Complete  Medication Review (RN CM) Complete Complete Complete

## 2024-12-14 NOTE — Progress Notes (Signed)
 Called [facility] and spoke Essence Tuttle LPN. Report given to Essence, she is aware that transportation has not been called for patient yet.   Damien Stager, LPN

## 2024-12-14 NOTE — Plan of Care (Signed)

## 2024-12-23 ENCOUNTER — Emergency Department (HOSPITAL_COMMUNITY)

## 2024-12-23 ENCOUNTER — Encounter (HOSPITAL_COMMUNITY): Payer: Self-pay | Admitting: Internal Medicine

## 2024-12-23 ENCOUNTER — Inpatient Hospital Stay (HOSPITAL_COMMUNITY)
Admission: EM | Admit: 2024-12-23 | Discharge: 2024-12-28 | DRG: 177 | Disposition: A | Source: Skilled Nursing Facility | Attending: Internal Medicine | Admitting: Internal Medicine

## 2024-12-23 ENCOUNTER — Other Ambulatory Visit: Payer: Self-pay

## 2024-12-23 DIAGNOSIS — E785 Hyperlipidemia, unspecified: Secondary | ICD-10-CM | POA: Diagnosis present

## 2024-12-23 DIAGNOSIS — E1065 Type 1 diabetes mellitus with hyperglycemia: Secondary | ICD-10-CM | POA: Diagnosis present

## 2024-12-23 DIAGNOSIS — R531 Weakness: Principal | ICD-10-CM

## 2024-12-23 DIAGNOSIS — I2510 Atherosclerotic heart disease of native coronary artery without angina pectoris: Secondary | ICD-10-CM | POA: Diagnosis present

## 2024-12-23 DIAGNOSIS — Z96611 Presence of right artificial shoulder joint: Secondary | ICD-10-CM | POA: Diagnosis present

## 2024-12-23 DIAGNOSIS — Z87820 Personal history of traumatic brain injury: Secondary | ICD-10-CM

## 2024-12-23 DIAGNOSIS — R64 Cachexia: Secondary | ICD-10-CM | POA: Diagnosis present

## 2024-12-23 DIAGNOSIS — E1022 Type 1 diabetes mellitus with diabetic chronic kidney disease: Secondary | ICD-10-CM | POA: Diagnosis present

## 2024-12-23 DIAGNOSIS — Z952 Presence of prosthetic heart valve: Secondary | ICD-10-CM | POA: Diagnosis not present

## 2024-12-23 DIAGNOSIS — Z1152 Encounter for screening for COVID-19: Secondary | ICD-10-CM | POA: Diagnosis not present

## 2024-12-23 DIAGNOSIS — I129 Hypertensive chronic kidney disease with stage 1 through stage 4 chronic kidney disease, or unspecified chronic kidney disease: Secondary | ICD-10-CM | POA: Diagnosis present

## 2024-12-23 DIAGNOSIS — R54 Age-related physical debility: Secondary | ICD-10-CM | POA: Diagnosis present

## 2024-12-23 DIAGNOSIS — N1832 Chronic kidney disease, stage 3b: Secondary | ICD-10-CM | POA: Diagnosis not present

## 2024-12-23 DIAGNOSIS — J690 Pneumonitis due to inhalation of food and vomit: Principal | ICD-10-CM | POA: Diagnosis present

## 2024-12-23 DIAGNOSIS — Z886 Allergy status to analgesic agent status: Secondary | ICD-10-CM

## 2024-12-23 DIAGNOSIS — Z515 Encounter for palliative care: Secondary | ICD-10-CM | POA: Diagnosis not present

## 2024-12-23 DIAGNOSIS — Z7902 Long term (current) use of antithrombotics/antiplatelets: Secondary | ICD-10-CM

## 2024-12-23 DIAGNOSIS — I10 Essential (primary) hypertension: Secondary | ICD-10-CM | POA: Diagnosis not present

## 2024-12-23 DIAGNOSIS — Z950 Presence of cardiac pacemaker: Secondary | ICD-10-CM | POA: Diagnosis not present

## 2024-12-23 DIAGNOSIS — Z66 Do not resuscitate: Secondary | ICD-10-CM | POA: Diagnosis present

## 2024-12-23 DIAGNOSIS — L8990 Pressure ulcer of unspecified site, unspecified stage: Secondary | ICD-10-CM | POA: Insufficient documentation

## 2024-12-23 DIAGNOSIS — Z881 Allergy status to other antibiotic agents status: Secondary | ICD-10-CM

## 2024-12-23 DIAGNOSIS — Z79899 Other long term (current) drug therapy: Secondary | ICD-10-CM

## 2024-12-23 DIAGNOSIS — Z96641 Presence of right artificial hip joint: Secondary | ICD-10-CM | POA: Diagnosis present

## 2024-12-23 DIAGNOSIS — J189 Pneumonia, unspecified organism: Principal | ICD-10-CM | POA: Diagnosis present

## 2024-12-23 DIAGNOSIS — N1831 Chronic kidney disease, stage 3a: Secondary | ICD-10-CM | POA: Diagnosis present

## 2024-12-23 DIAGNOSIS — E109 Type 1 diabetes mellitus without complications: Secondary | ICD-10-CM | POA: Diagnosis present

## 2024-12-23 DIAGNOSIS — Z883 Allergy status to other anti-infective agents status: Secondary | ICD-10-CM

## 2024-12-23 DIAGNOSIS — N183 Chronic kidney disease, stage 3 unspecified: Secondary | ICD-10-CM | POA: Diagnosis present

## 2024-12-23 DIAGNOSIS — R1310 Dysphagia, unspecified: Secondary | ICD-10-CM | POA: Diagnosis present

## 2024-12-23 DIAGNOSIS — Z888 Allergy status to other drugs, medicaments and biological substances status: Secondary | ICD-10-CM

## 2024-12-23 DIAGNOSIS — Z7982 Long term (current) use of aspirin: Secondary | ICD-10-CM | POA: Diagnosis not present

## 2024-12-23 DIAGNOSIS — J9601 Acute respiratory failure with hypoxia: Secondary | ICD-10-CM | POA: Diagnosis present

## 2024-12-23 DIAGNOSIS — R591 Generalized enlarged lymph nodes: Secondary | ICD-10-CM | POA: Diagnosis present

## 2024-12-23 DIAGNOSIS — G40909 Epilepsy, unspecified, not intractable, without status epilepticus: Secondary | ICD-10-CM | POA: Diagnosis present

## 2024-12-23 DIAGNOSIS — M810 Age-related osteoporosis without current pathological fracture: Secondary | ICD-10-CM | POA: Diagnosis present

## 2024-12-23 LAB — I-STAT VENOUS BLOOD GAS, ED
Acid-Base Excess: 10 mmol/L — ABNORMAL HIGH (ref 0.0–2.0)
Bicarbonate: 30.8 mmol/L — ABNORMAL HIGH (ref 20.0–28.0)
Calcium, Ion: 0.92 mmol/L — ABNORMAL LOW (ref 1.15–1.40)
HCT: 31 % — ABNORMAL LOW (ref 36.0–46.0)
Hemoglobin: 10.5 g/dL — ABNORMAL LOW (ref 12.0–15.0)
O2 Saturation: 100 %
Potassium: 4.7 mmol/L (ref 3.5–5.1)
Sodium: 133 mmol/L — ABNORMAL LOW (ref 135–145)
TCO2: 32 mmol/L (ref 22–32)
pCO2, Ven: 28.8 mmHg — ABNORMAL LOW (ref 44–60)
pH, Ven: 7.637 (ref 7.25–7.43)
pO2, Ven: 163 mmHg — ABNORMAL HIGH (ref 32–45)

## 2024-12-23 LAB — I-STAT CHEM 8, ED
BUN: 33 mg/dL — ABNORMAL HIGH (ref 8–23)
Calcium, Ion: 0.93 mmol/L — ABNORMAL LOW (ref 1.15–1.40)
Chloride: 100 mmol/L (ref 98–111)
Creatinine, Ser: 0.6 mg/dL (ref 0.44–1.00)
Glucose, Bld: 204 mg/dL — ABNORMAL HIGH (ref 70–99)
HCT: 31 % — ABNORMAL LOW (ref 36.0–46.0)
Hemoglobin: 10.5 g/dL — ABNORMAL LOW (ref 12.0–15.0)
Potassium: 4.7 mmol/L (ref 3.5–5.1)
Sodium: 133 mmol/L — ABNORMAL LOW (ref 135–145)
TCO2: 30 mmol/L (ref 22–32)

## 2024-12-23 LAB — URINALYSIS, W/ REFLEX TO CULTURE (INFECTION SUSPECTED)
Bilirubin Urine: NEGATIVE
Glucose, UA: 150 mg/dL — AB
Hgb urine dipstick: NEGATIVE
Ketones, ur: 5 mg/dL — AB
Leukocytes,Ua: NEGATIVE
Nitrite: NEGATIVE
Protein, ur: 30 mg/dL — AB
Specific Gravity, Urine: 1.051 — ABNORMAL HIGH (ref 1.005–1.030)
pH: 5 (ref 5.0–8.0)

## 2024-12-23 LAB — HEPATIC FUNCTION PANEL
ALT: 21 U/L (ref 0–44)
AST: 27 U/L (ref 15–41)
Albumin: 3 g/dL — ABNORMAL LOW (ref 3.5–5.0)
Alkaline Phosphatase: 144 U/L — ABNORMAL HIGH (ref 38–126)
Bilirubin, Direct: 0.1 mg/dL (ref 0.0–0.2)
Indirect Bilirubin: 0.2 mg/dL — ABNORMAL LOW (ref 0.3–0.9)
Total Bilirubin: 0.3 mg/dL (ref 0.0–1.2)
Total Protein: 7.2 g/dL (ref 6.5–8.1)

## 2024-12-23 LAB — PRO BRAIN NATRIURETIC PEPTIDE: Pro Brain Natriuretic Peptide: 1849 pg/mL — ABNORMAL HIGH

## 2024-12-23 LAB — GLUCOSE, CAPILLARY
Glucose-Capillary: 235 mg/dL — ABNORMAL HIGH (ref 70–99)
Glucose-Capillary: 135 mg/dL — ABNORMAL HIGH (ref 70–99)

## 2024-12-23 LAB — CBC WITH DIFFERENTIAL/PLATELET
Abs Immature Granulocytes: 0.05 10*3/uL (ref 0.00–0.07)
Basophils Absolute: 0.1 10*3/uL (ref 0.0–0.1)
Basophils Relative: 1 %
Eosinophils Absolute: 0.1 10*3/uL (ref 0.0–0.5)
Eosinophils Relative: 1 %
HCT: 29.9 % — ABNORMAL LOW (ref 36.0–46.0)
Hemoglobin: 9.5 g/dL — ABNORMAL LOW (ref 12.0–15.0)
Immature Granulocytes: 0 %
Lymphocytes Relative: 7 %
Lymphs Abs: 1 10*3/uL (ref 0.7–4.0)
MCH: 26.3 pg (ref 26.0–34.0)
MCHC: 31.8 g/dL (ref 30.0–36.0)
MCV: 82.8 fL (ref 80.0–100.0)
Monocytes Absolute: 1 10*3/uL (ref 0.1–1.0)
Monocytes Relative: 7 %
Neutro Abs: 12.9 10*3/uL — ABNORMAL HIGH (ref 1.7–7.7)
Neutrophils Relative %: 84 %
Platelets: 355 10*3/uL (ref 150–400)
RBC: 3.61 MIL/uL — ABNORMAL LOW (ref 3.87–5.11)
RDW: 14.1 % (ref 11.5–15.5)
WBC: 15.1 10*3/uL — ABNORMAL HIGH (ref 4.0–10.5)
nRBC: 0 % (ref 0.0–0.2)

## 2024-12-23 LAB — TYPE AND SCREEN
ABO/RH(D): A POS
Antibody Screen: NEGATIVE

## 2024-12-23 LAB — COMPREHENSIVE METABOLIC PANEL WITH GFR
ALT: 18 U/L (ref 0–44)
AST: 29 U/L (ref 15–41)
Albumin: 3 g/dL — ABNORMAL LOW (ref 3.5–5.0)
Alkaline Phosphatase: 144 U/L — ABNORMAL HIGH (ref 38–126)
Anion gap: 12 (ref 5–15)
BUN: 28 mg/dL — ABNORMAL HIGH (ref 8–23)
CO2: 25 mmol/L (ref 22–32)
Calcium: 8.8 mg/dL — ABNORMAL LOW (ref 8.9–10.3)
Chloride: 97 mmol/L — ABNORMAL LOW (ref 98–111)
Creatinine, Ser: 0.58 mg/dL (ref 0.44–1.00)
GFR, Estimated: >60 mL/min
Glucose, Bld: 200 mg/dL — ABNORMAL HIGH (ref 70–99)
Potassium: 5 mmol/L (ref 3.5–5.1)
Sodium: 133 mmol/L — ABNORMAL LOW (ref 135–145)
Total Bilirubin: 0.3 mg/dL (ref 0.0–1.2)
Total Protein: 7.3 g/dL (ref 6.5–8.1)

## 2024-12-23 LAB — CBG MONITORING, ED: Glucose-Capillary: 269 mg/dL — ABNORMAL HIGH (ref 70–99)

## 2024-12-23 LAB — CK: Total CK: 20 U/L — ABNORMAL LOW (ref 38–234)

## 2024-12-23 LAB — TROPONIN T, HIGH SENSITIVITY
Troponin T High Sensitivity: 24 ng/L — ABNORMAL HIGH (ref 0–19)
Troponin T High Sensitivity: 23 ng/L — ABNORMAL HIGH (ref 0–19)

## 2024-12-23 LAB — MAGNESIUM: Magnesium: 2 mg/dL (ref 1.7–2.4)

## 2024-12-23 LAB — RESP PANEL BY RT-PCR (RSV, FLU A&B, COVID)  RVPGX2
Influenza A by PCR: NEGATIVE
Influenza B by PCR: NEGATIVE
Resp Syncytial Virus by PCR: NEGATIVE
SARS Coronavirus 2 by RT PCR: NEGATIVE

## 2024-12-23 LAB — I-STAT CG4 LACTIC ACID, ED: Lactic Acid, Venous: 1.3 mmol/L (ref 0.5–1.9)

## 2024-12-23 LAB — PROTIME-INR
INR: 1.1 (ref 0.8–1.2)
Prothrombin Time: 14.7 s (ref 11.4–15.2)

## 2024-12-23 MED ORDER — SODIUM CHLORIDE 0.9% FLUSH
3.0000 mL | Freq: Two times a day (BID) | INTRAVENOUS | Status: DC
  Administered 2024-12-24 – 2024-12-25 (×3): 3 mL via INTRAVENOUS

## 2024-12-23 MED ORDER — INSULIN ASPART 100 UNIT/ML IJ SOLN
0.0000 [IU] | INTRAMUSCULAR | Status: DC
  Administered 2024-12-23: 5 [IU] via SUBCUTANEOUS
  Administered 2024-12-24: 2 [IU] via SUBCUTANEOUS
  Filled 2024-12-23: qty 5

## 2024-12-23 MED ORDER — MORPHINE SULFATE (PF) 2 MG/ML IV SOLN: 2.0000 mg | INTRAVENOUS | Status: DC | PRN

## 2024-12-23 MED ORDER — LEVOFLOXACIN IN D5W 750 MG/150ML IV SOLN
750.0000 mg | Freq: Once | INTRAVENOUS | Status: AC
  Administered 2024-12-23: 750 mg via INTRAVENOUS
  Filled 2024-12-23: qty 150

## 2024-12-23 MED ORDER — MONTELUKAST SODIUM 10 MG PO TABS
10.0000 mg | ORAL_TABLET | Freq: Every day | ORAL | Status: DC
  Administered 2024-12-23 – 2024-12-24 (×2): 10 mg via ORAL
  Filled 2024-12-23 (×2): qty 1

## 2024-12-23 MED ORDER — INSULIN GLARGINE-YFGN 100 UNIT/ML ~~LOC~~ SOLN
15.0000 [IU] | Freq: Two times a day (BID) | SUBCUTANEOUS | Status: DC
  Administered 2024-12-23: 15 [IU] via SUBCUTANEOUS
  Filled 2024-12-23 (×3): qty 0.15

## 2024-12-23 MED ORDER — SIMVASTATIN 20 MG PO TABS
40.0000 mg | ORAL_TABLET | Freq: Every day | ORAL | Status: DC
  Administered 2024-12-23: 40 mg via ORAL
  Filled 2024-12-23 (×2): qty 2

## 2024-12-23 MED ORDER — ACETAMINOPHEN 325 MG PO TABS: 650.0000 mg | ORAL_TABLET | Freq: Four times a day (QID) | ORAL | Status: DC | PRN

## 2024-12-23 MED ORDER — SODIUM CHLORIDE 0.9 % IV SOLN
2.0000 g | Freq: Two times a day (BID) | INTRAVENOUS | Status: DC
  Administered 2024-12-24 – 2024-12-25 (×2): 2 g via INTRAVENOUS
  Filled 2024-12-23 (×2): qty 12.5

## 2024-12-23 MED ORDER — HEPARIN SODIUM (PORCINE) 5000 UNIT/ML IJ SOLN: 5000.0000 [IU] | Freq: Two times a day (BID) | INTRAMUSCULAR | Status: DC

## 2024-12-23 MED ORDER — ACETAMINOPHEN 650 MG RE SUPP
650.0000 mg | Freq: Four times a day (QID) | RECTAL | Status: DC | PRN
  Filled 2024-12-23: qty 1

## 2024-12-23 MED ORDER — LACTATED RINGERS IV SOLN: INTRAVENOUS | Status: AC

## 2024-12-23 MED ORDER — LEVETIRACETAM 250 MG PO TABS
250.0000 mg | ORAL_TABLET | Freq: Two times a day (BID) | ORAL | Status: DC
  Administered 2024-12-23: 250 mg via ORAL
  Filled 2024-12-23 (×2): qty 1

## 2024-12-23 MED ORDER — IOHEXOL 350 MG/ML SOLN
75.0000 mL | Freq: Once | INTRAVENOUS | Status: AC | PRN
  Administered 2024-12-23: 75 mL via INTRAVENOUS

## 2024-12-23 MED ORDER — HYDRALAZINE HCL 20 MG/ML IJ SOLN: 5.0000 mg | INTRAMUSCULAR | Status: DC | PRN

## 2024-12-23 NOTE — ED Triage Notes (Addendum)
 Pt bib [facility] from [facility]. Pt has been increasingly weak the past few days. O2 sats mid 80s, placed on 2L [place]. CBGs have been high. VSS. CBG 239. Hip surgery 10 days ago. DNR at bedside

## 2024-12-23 NOTE — ED Provider Notes (Signed)
" °  Physical Exam  BP (!) 146/55   Pulse 80   Temp 98.1 F (36.7 C)   Resp 17   SpO2 96%   Physical Exam  Procedures  Procedures  ED Course / MDM   Clinical Course as of 12/23/24 1516  Tue Dec 23, 2024  1515 Assumed care from Dr Laurice. 84 yo F with multiple comorbidities who presented with weakness and was hypoxic for ems but not here. Was dc'd on the 17th after a hip fracture that was repaired. Awaiting CTA. Labs with minimally elevated WBC. Covid and flu negative.  [RP]    Clinical Course User Index [RP] Yolande Lamar BROCKS, MD   Medical Decision Making Amount and/or Complexity of Data Reviewed Labs: ordered. Radiology: ordered.   ***     "

## 2024-12-23 NOTE — ED Provider Notes (Signed)
 " [place] EMERGENCY DEPARTMENT AT [facility] Provider Note   CSN: [id number] Arrival date & time: 12/23/24  1236     Patient presents with: Weakness   Danielle Clarke is a 84 y.o. female.   84 year old female with prior medical history as detailed below presents for evaluation.  Patient is at [facility].  Patient with increasing weakness x 2 to 3 days.  Patient was hospitalized approximately 10 days ago for hip surgery.  See excerpt from recent discharge summary below:  Admit date: 12/08/2024 Discharge date: 12/13/2024   Admitted From: Skilled nursing facility Disposition: Skilled nursing facility   Recommendations for Outpatient Follow-up:  1. Follow up with PCP in 1-2 weeks after discharge 2. Please obtain BMP/CBC in one week   Discharge Condition: Stable CODE STATUS: DNR/DNI Diet recommendation: Regular diet, carb controlled   Discharge summary: 84 year old with history of aortic stenosis status post TAVR, osteoarthritis, asthmatic bronchitis, coronary artery disease, hypertension hyperlipidemia, type 1 diabetes on insulin , CKD stage IIIa, multiple compression fractures, history of intracranial bleed and seizure disorder who was recently admitted to the hospital on 12/28 with traumatic right hip fracture underwent ORIF and discharged to SNF on 1/2.  Facility called EMS due to dyspnea and hypoxemia with 88%  at her facility.  No other symptoms of infection. In the emergency room WBC count 18.3, proBNP 12,000.  Troponin 19.  COVID, flu and RSV negative.  Blood glucose 378. Chest radiograph without any acute findings.  CT angiogram of the chest with no PE, no evidence of pneumonia.  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  Admitted due to significant symptoms.  She was 86% on room air and needed 2 L of oxygen.  Patient is on room air now.   Acute hypoxemia, suspected acute bronchitis with wheezing: Normalized.  On room air now. Treated  symptomatically.  Asymptomatic today.  Continue chest physiotherapy and mobility.   Right upper lobe pleural-based tumor: Not likely causing acute hypoxemia. Patient frail and debilitated.  After discussion with her daughter, recommended not to do biopsy or PET scan. Monitoring but do not anticipate any biopsy. referral to pulmonary tumor clinic for follow-up however do not anticipate surgery or any procedure.   Type 1 diabetes with hyperglycemia, hypoglycemia due to excess insulin . Blood sugars fluctuate.  Blood sugars elevated today.  Avoid hypoglycemia. Continue Lantus  8 units twice daily. Continue sliding scale. Allow regular diet due to her frailty. Avoid hypoglycemia.  Level of hyperglycemia may be acceptable to avoid hypoglycemia.   Essential hypertension: Blood pressure is stable on metoprolol  25 mg twice daily.   History of TAVR: Stable.   Seizure disorder: On Keppra  250 mg twice daily.  No evidence of new seizure.   Right hip fracture: ORIF 12/29. Weightbearing as tolerated.  Looks stable.  Start PT OT.  Refer back to a SNF. Aspirin  81 mg twice daily for DVT prophylaxis. Currently pain-free.   Medically stable to transition to a skilled level of care.   The history is provided by the patient, the EMS personnel and medical records.       Prior to Admission medications  Medication Sig Start Date End Date Taking? Authorizing Provider  acetaminophen  (TYLENOL ) 500 MG tablet Take 500 mg by mouth every 6 (six) hours as needed for moderate pain (pain score 4-6).    [provider]  aspirin  EC 81 MG tablet Take 1 tablet (81 mg total) by mouth 2 (two)  times daily. Swallow whole. 11/26/24   Danton Lauraine LABOR, PA-C  docusate sodium  (COLACE) 100 MG capsule Take 1 capsule (100 mg total) by mouth 2 (two) times daily. 11/27/24   Rai, Nydia POUR, MD  insulin  lispro (HUMALOG) 100 UNIT/ML injection Inject 0-12 Units into the skin See admin instructions. Inject 0-12 units  subcutaneously before meals and at bedtime per sliding scale: < 70 : contact provider 70-200 : 0 units 201-250 : 2 units 251-300 : 4 units 301-350 : 6 units 351-400 : 8 units 401-450 : 10 units 451-600 : 12 units - recheck blood sugar in 2 hours, if BS > 350 or < 100, notify provider.    [provider]  LANTUS  SOLOSTAR 100 UNIT/ML Solostar Pen Inject 8 Units into the skin 2 (two) times daily. 09/16/24   [provider]  levETIRAcetam  (KEPPRA ) 250 MG tablet Take 1 tablet (250 mg total) by mouth 2 (two) times daily. 04/25/24   Akula, Vijaya, MD  methocarbamol  (ROBAXIN ) 500 MG tablet Take 1 tablet (500 mg total) by mouth every 8 (eight) hours as needed for muscle spasms. 11/26/24   Danton Lauraine LABOR, PA-C  metoprolol  tartrate (LOPRESSOR ) 25 MG tablet Take 1 tablet (25 mg total) by mouth 2 (two) times daily. Patient taking differently: Take 12.5 mg by mouth 2 (two) times daily. Hold for pulse less than 60, SBP less than 110 or DBP less than 60 11/28/24   Rai, Ripudeep K, MD  montelukast  (SINGULAIR ) 10 MG tablet Take 10 mg by mouth at bedtime.    [provider]  simvastatin  (ZOCOR ) 40 MG tablet Take 1 tablet (40 mg total) by mouth at bedtime. 11/27/24   Rai, Nydia POUR, MD  traZODone  (DESYREL ) 50 MG tablet Take 0.5 tablets (25 mg total) by mouth at bedtime as needed for sleep. 11/27/24   Rai, Nydia POUR, MD    Allergies: Augmentin [amoxicillin-pot clavulanate], Ceclor [cefaclor], Firvanq  [vancomycin ], Sumycin [tetracycline], Accupril  [quinapril  hcl], Bactrim  [sulfamethoxazole -trimethoprim ], Fosamax [alendronate sodium], Nsaids, and Deltasone [prednisone]    Review of Systems  All other systems reviewed and are negative.   Updated Vital Signs BP (!) 132/51   Pulse 77   Temp 98.1 F (36.7 C)   Resp 16   SpO2 94%   Physical Exam Vitals and nursing note reviewed.  Constitutional:      General: She is not in acute distress.    Appearance: She is well-developed.      Comments: Alert, frail, chronically ill  HENT:     Head: Normocephalic and atraumatic.     Mouth/Throat:     Mouth: Mucous membranes are moist.  Eyes:     Extraocular Movements: Extraocular movements intact.     Conjunctiva/sclera: Conjunctivae normal.     Pupils: Pupils are equal, round, and reactive to light.  Cardiovascular:     Rate and Rhythm: Normal rate and regular rhythm.     Heart sounds: Normal heart sounds.  Pulmonary:     Effort: Pulmonary effort is normal. No respiratory distress.     Comments: Decreased breath sounds at bilateral bases Abdominal:     General: Abdomen is flat. There is no distension.     Palpations: Abdomen is soft.     Tenderness: There is no abdominal tenderness.  Musculoskeletal:        General: No deformity. Normal range of motion.     Cervical back: Normal range of motion and neck supple.  Skin:    General: Skin is warm and dry.  Neurological:     General: No focal deficit present.     Mental Status: She is alert and oriented to person, place, and time. Mental status is at baseline.     (all labs ordered are listed, but only abnormal results are displayed) Labs Reviewed  CULTURE, BLOOD (ROUTINE X 2)  CULTURE, BLOOD (ROUTINE X 2)  RESP PANEL BY RT-PCR (RSV, FLU A&B, COVID)  [id number]  CBC WITH DIFFERENTIAL/PLATELET  PRO BRAIN NATRIURETIC PEPTIDE  COMPREHENSIVE METABOLIC PANEL WITH GFR  PROTIME-INR  URINALYSIS, W/ REFLEX TO CULTURE (INFECTION SUSPECTED)  LEVETIRACETAM  LEVEL  I-STAT CHEM 8, ED  I-STAT CG4 LACTIC ACID, ED  I-STAT VENOUS BLOOD GAS, ED  TYPE AND SCREEN  TROPONIN T, HIGH SENSITIVITY    EKG: EKG Interpretation Date/Time:  Tuesday December 23 2024 12:52:01 EST Ventricular Rate:  79 PR Interval:  166 QRS Duration:  107 QT Interval:  421 QTC Calculation: 483 R Axis:   -60  Text Interpretation: Sinus rhythm LVH with secondary repolarization abnormality Inferior infarct, old Anterior infarct, old Baseline wander in lead(s)  V3 Confirmed by Laurice Coy [phone]) on 12/23/2024 12:57:26 PM  Radiology: No results found.   Procedures   Medications Ordered in the ED - No data to display  Clinical Course as of 12/26/24 1423  Tue Dec 23, 2024  1515 Assumed care from Dr Laurice. 84 yo F with multiple comorbidities who presented with weakness and was hypoxic for ems but not here. Was dc'd on the 17th after a hip fracture that was repaired. Awaiting CTA. Labs with minimally elevated WBC. Covid and flu negative.  [RP]  1611 CT Angio Chest PE W and/or Wo Contrast 2. New airspace disease in the right upper lobe and progressing foci of consolidation in the lower lungs, with prominent fluid and debris within the bronchi, likely indicating aspiration. 3. 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 [RP]  1647 Dr Tobie from hospitalist consulted for admission for aspiration pneumonia. With patient's allergies will start on levaquin  [RP]    Clinical Course User Index [RP] Yolande Lamar BROCKS, MD                                 Medical Decision Making Patient presents with generalized weakness and associated hypoxia.   Screening labs and imaging ordered.   Oncoming EDP aware of case and need for disposition.  Amount and/or Complexity of Data Reviewed Labs: ordered. Radiology: ordered. Decision-making details documented in ED Course.  Risk Prescription drug management. Decision regarding hospitalization.        Final diagnoses:  Weakness    ED Discharge Orders     None          Laurice Coy BROCKS, MD 12/26/24 1425  "

## 2024-12-23 NOTE — ED Notes (Addendum)
 Pt refusing second set of cultures. MD aware

## 2024-12-23 NOTE — H&P (Signed)
 " History and Physical    Patient: Danielle Clarke FMW:[id number] DOB: 1941-10-04 DOA: 12/23/2024 DOS: the patient was seen and examined on 12/23/2024 . PCP: [name], MD  Patient coming from: SNF Chief complaint: Chief Complaint  Patient presents with   Weakness   HPI:  Danielle Clarke is a 84 y.o. female with past medical history  of  aortic stenosis, status post TAVR, osteoarthritis, asthmatic bronchitis with URIs, coronary artery disease, cataracts, glaucoma, history of heart murmur, hyperlipidemia, hypertension, history of neck fracture, osteoporosis, type I DM, stage III CKD, left humeral fracture, T12 compression fracture, prolonged QT interval, history intracranial bleeding, history of TBI, seizure disorder, brought from facility for cough similar to recent admission.  Patient recently discharged on January 17 where she was worked up with a CT chest PE which was negative for PE but positive for right upper lobe mass that is currently being managed by pulmonology.  Per discharge note daughter did not want biopsy.  Patient also was noted to have oxygen requirement with hypoxia of 86% on room air and stable on 2 L nasal cannula. At bedside patient is awake oriented to self and year states that she has not walked since her right hip surgery since her fall.  Have spoken to daughter [name], about plan for admission for her pneumonia suspicion for dysphagia or odynophagia or difficulty swallowing and need for evaluation for underlying pathology and possibly a stroke rule out.  Daughter is in agreement with plan.  Discussed CODE STATUS and patient is DNR/DNI.  ED Course:  Vital signs in the ED were notable for the following:  Vitals:   12/23/24 1629 12/23/24 1650 12/23/24 1845 12/23/24 1900  BP:  (!) 137/45 (!) 117/40   Pulse:   100 98  Temp: 98.6 F (37 C)     Resp:  18 (!) 28 (!) 24  SpO2:   94% 95%  TempSrc: Oral      >>ED evaluation thus far shows: - Venous blood gas  today shows pH of 7.637 with pCO2 of 28.8 and pO2 of 163 bicarb of 30.8. - CMP showing sodium 133 chloride 97 glucose 200 BUN 28 normal creatinine alk phos 144 normal AST ALT albumin  3.0. - proBNP of 1849.0, troponin of 24 repeat troponin of 23. - Lactic acid of 1.3. - CBC shows white count of 15.1 hemoglobin of 9.5 platelets of 355. - Respiratory panel negative for flu RSV and COVID. - Urinalysis today shows protein and glucose rare bacteria 0-5 RBCs and 0-5 WBC. - Blood cultures collected in the ED x 2. 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  >>While in the ED patient received the following: Medications  levofloxacin  (LEVAQUIN ) IVPB 750 mg (has no administration in time range)  iohexol  (OMNIPAQUE ) 350 MG/ML injection 75 mL (75 mLs Intravenous Contrast Given 12/23/24 1538)   Review of Systems  Respiratory:  Positive for cough.   Neurological:  Positive for weakness.  Endo/Heme/Allergies:        Hyperglycemia.   Past Medical History:  Diagnosis Date   Aortic stenosis, mild    Arthritis    Asthmatic bronchitis    with colds per patient   Carotid artery  disease    40-59% bilateral ICA stenosis   Cataract    Cutaneous abscess of right foot    Glaucoma    Heart murmur    Dr Claudene is her  cardiologist.    Hyperlipemia    Hypertension    Dr. Claudene ~ 2 years ago   Neck fracture [facility])    july 2013   Osteoporosis    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   20mm S3UR via TF approach with Dr. Wendel and Dr. Maryjane   Syncope    Type 1 diabetes mellitus [facility])    Past Surgical History:  Procedure Laterality Date   ANKLE FRACTURE  SURGERY Left 2001   steel plate and 3 screws    ANTERIOR APPROACH HEMI HIP ARTHROPLASTY Right 11/24/2024   Procedure: HEMIARTHROPLASTY, HIP, DIRECT ANTERIOR APPROACH, FOR FRACTURE;  Surgeon: [name] Franky SQUIBB, MD;  Location: [facility] OR;  Service: Orthopedics;  Laterality: Right;   ANTERIOR CERVICAL CORPECTOMY N/A 07/31/2014   Procedure: Cervical Four to Cervical Six Corpectomy;  Surgeon: Catalina CHRISTELLA Stains, MD;  Location: [facility] NEURO ORS;  Service: Neurosurgery;  Laterality: N/A;  C4 to C6 Corpectomy   BREAST SURGERY  1988   CATARACT EXTRACTION W/ INTRAOCULAR LENS  IMPLANT, BILATERAL Bilateral 2011   right and then left   EYE SURGERY     FRACTURE SURGERY     HAMMER TOE SURGERY  1998   I & D EXTREMITY Right 09/27/2018   Procedure: IRRIGATION AND DEBRIDEMENT RIGHT FOOT;  Surgeon: Harden Jerona GAILS, MD;  Location: [facility] OR;  Service: Orthopedics;  Laterality: Right;   INSERT / REPLACE / REMOVE PACEMAKER     INTRAOPERATIVE TRANSTHORACIC ECHOCARDIOGRAM N/A 02/06/2023   Procedure: INTRAOPERATIVE TRANSTHORACIC ECHOCARDIOGRAM;  Surgeon: Wendel Lurena POUR, MD;  Location[facility] OR;  Service: Open Heart Surgery;  Laterality: N/A;   IRRIGATION AND DEBRIDEMENT ELBOW Right 11/24/2024   Procedure: IRRIGATION AND DEBRIDEMENT ELBOW WITH WOUND CLOSURE;  Surgeon: [name] Franky SQUIBB, MD;  Location: [facility] OR;  Service: Orthopedics;  Laterality: Right;   JOINT REPLACEMENT     PACEMAKER IMPLANT N/A 02/10/2019   Procedure: PACEMAKER IMPLANT;  Surgeon: Waddell Danelle ORN, MD;  Location: [facility] INVASIVE CV LAB;  Service: Cardiovascular;  Laterality: N/A;   RIGHT/LEFT HEART CATH AND CORONARY ANGIOGRAPHY N/A 01/11/2023   Procedure: RIGHT/LEFT HEART CATH AND CORONARY ANGIOGRAPHY;  Surgeon: Wendel Lurena POUR, MD;  Location: [facility] INVASIVE CV LAB;  Service: Cardiovascular;  Laterality: N/A;   TONSILLECTOMY AND ADENOIDECTOMY  1948   TOTAL SHOULDER REPLACEMENT  2010   right shoulder    TRANSCATHETER AORTIC VALVE REPLACEMENT, TRANSFEMORAL N/A 02/06/2023    Procedure: Transcatheter Aortic Valve Replacement, Transfemoral;  Surgeon: Thukkani, Arun K, MD;  Location: [facility] OR;  Service: Open Heart Surgery;  Laterality: N/A;   TUBAL LIGATION  1979   TYMPANOSTOMY TUBE PLACEMENT Bilateral     reports that she has never smoked. She has never used smokeless tobacco. She reports current alcohol  use of about 2.0 standard drinks of alcohol  per week. She reports that she does not use drugs. Allergies[1] Family History  Problem Relation Age of Onset   Cancer Mother    Heart Problems Father    Prior to Admission medications  Medication Sig Start Date End Date Taking? Authorizing Provider  acetaminophen  (TYLENOL ) 500 MG tablet Take 1,000 mg by mouth 3 (three) times daily as needed for moderate pain (pain score 4-6) or mild pain (pain score 1-3).   Yes [provider]  aspirin   EC 81 MG tablet Take 1 tablet (81 mg total) by mouth 2 (two) times daily. Swallow whole. 11/26/24  Yes Danton Lauraine LABOR, PA-C  docusate sodium  (COLACE) 100 MG capsule Take 1 capsule (100 mg total) by mouth 2 (two) times daily. 11/27/24  Yes Rai, Ripudeep K, MD  insulin  lispro (HUMALOG) 100 UNIT/ML injection Inject 0-12 Units into the skin 4 (four) times daily - after meals and at bedtime. sliding scale: < 70 : contact provider 70-200 : 0 units 201-250 : 2 units 251-300 : 4 units 301-350 : 6 units 351-400 : 8 units 401-450 : 10 units 451-600 : 12 units - recheck blood sugar in 2 hours, if BS > 350 or < 100, notify provider.   Yes [provider]  LANTUS  SOLOSTAR 100 UNIT/ML Solostar Pen Inject 13 Units into the skin 2 (two) times daily. 09/16/24  Yes [provider]  levETIRAcetam  (KEPPRA ) 250 MG tablet Take 1 tablet (250 mg total) by mouth 2 (two) times daily. 04/25/24  Yes Akula, Vijaya, MD  methocarbamol  (ROBAXIN ) 500 MG tablet Take 1 tablet (500 mg total) by mouth every 8 (eight) hours as needed for muscle spasms. 11/26/24  Yes Danton Lauraine LABOR, PA-C  metoprolol   tartrate (LOPRESSOR ) 25 MG tablet Take 1 tablet (25 mg total) by mouth 2 (two) times daily. 11/28/24  Yes Rai, Ripudeep K, MD  montelukast  (SINGULAIR ) 10 MG tablet Take 10 mg by mouth at bedtime.   Yes [provider]  simvastatin  (ZOCOR ) 40 MG tablet Take 1 tablet (40 mg total) by mouth at bedtime. 11/27/24  Yes Rai, Ripudeep K, MD  traZODone  (DESYREL ) 50 MG tablet Take 0.5 tablets (25 mg total) by mouth at bedtime as needed for sleep. 11/27/24  Yes Rai, Ripudeep K, MD                                                                                 Vitals:   12/23/24 1629 12/23/24 1650 12/23/24 1845 12/23/24 1900  BP:  (!) 137/45 (!) 117/40   Pulse:   100 98  Resp:  18 (!) 28 (!) 24  Temp: 98.6 F (37 C)     TempSrc: Oral     SpO2:   94% 95%   Physical Exam Vitals reviewed.  Constitutional:      General: She is not in acute distress.    Appearance: She is underweight. She is not ill-appearing.     Comments: Frail appearing  HENT:     Head: Normocephalic and atraumatic.  Eyes:     Extraocular Movements: Extraocular movements intact.  Cardiovascular:     Rate and Rhythm: Normal rate and regular rhythm.     Heart sounds: Normal heart sounds.  Pulmonary:     Breath sounds: Normal breath sounds.  Abdominal:     General: There is no distension.     Palpations: Abdomen is soft.     Tenderness: There is no abdominal tenderness.  Musculoskeletal:     Right lower leg: No edema.     Left lower leg: No edema.  Neurological:     General: No focal deficit present.     Mental Status: She is alert  and oriented to person, place, and time.     Labs on Admission: I have personally reviewed following labs and imaging studies CBC: Recent Labs  Lab 12/23/24 1253 12/23/24 1309 12/23/24 1310  WBC 15.1*  --   --   NEUTROABS 12.9*  --   --   HGB 9.5* 10.5* 10.5*  HCT 29.9* 31.0* 31.0*  MCV 82.8  --   --   PLT 355  --   --    Basic Metabolic Panel: Recent Labs  Lab 12/23/24 1253  12/23/24 1309 12/23/24 1310  NA 133* 133* 133*  K 5.0 4.7 4.7  CL 97* 100  --   CO2 25  --   --   GLUCOSE 200* 204*  --   BUN 28* 33*  --   CREATININE 0.58 0.60  --   CALCIUM 8.8*  --   --    GFR: CrCl cannot be calculated (Unknown ideal weight.). Liver Function Tests: Recent Labs  Lab 12/23/24 1253  AST 29  ALT 18  ALKPHOS 144*  BILITOT 0.3  PROT 7.3  ALBUMIN  3.0*   No results for input(s): LIPASE, AMYLASE in the last 168 hours. No results for input(s): AMMONIA in the last 168 hours. Recent Labs    04/15/24 1804 04/16/24 0647 04/17/24 1029 04/22/24 0431 11/23/24 0822 11/24/24 0409 11/25/24 0505 12/08/24 1218 12/09/24 0606 12/23/24 1253 12/23/24 1309  BUN 28* 19 16  --  17 23 26* 29* 23 28* 33*  CREATININE 0.80 0.62 0.57 0.76 0.71 0.76 0.69 0.68 0.58 0.58 0.60    Cardiac Enzymes: Recent Labs  Lab 12/23/24 1458  CKTOTAL 20*   BNP (last 3 results) Recent Labs    12/08/24 1218 12/23/24 1253  PROBNP 1,201.0* 1,849.0*   HbA1C: No results for input(s): HGBA1C in the last 72 hours. CBG: Recent Labs  Lab 12/23/24 1818  GLUCAP 269*   Lipid Profile: No results for input(s): CHOL, HDL, LDLCALC, TRIG, CHOLHDL, LDLDIRECT in the last 72 hours. Thyroid  Function Tests: No results for input(s): TSH, T4TOTAL, FREET4, T3FREE, THYROIDAB in the last 72 hours. Anemia Panel: No results for input(s): VITAMINB12, FOLATE, FERRITIN, TIBC, IRON, RETICCTPCT in the last 72 hours. Urine analysis:    Component Value Date/Time   COLORURINE YELLOW 12/23/2024 1551   APPEARANCEUR CLEAR 12/23/2024 1551   LABSPEC 1.051 (H) 12/23/2024 1551   PHURINE 5.0 12/23/2024 1551   GLUCOSEU 150 (A) 12/23/2024 1551   HGBUR NEGATIVE 12/23/2024 1551   BILIRUBINUR NEGATIVE 12/23/2024 1551   KETONESUR 5 (A) 12/23/2024 1551   PROTEINUR 30 (A) 12/23/2024 1551   UROBILINOGEN 0.2 10/07/2014 1808   NITRITE NEGATIVE 12/23/2024 1551   LEUKOCYTESUR  NEGATIVE 12/23/2024 1551   Radiological Exams on Admission: CT Angio Chest PE W and/or Wo Contrast Result Date: 12/23/2024 EXAM: CTA of the Chest with contrast for PE 12/23/2024 03:39:00 PM TECHNIQUE: CTA of the chest was performed after the administration of intravenous contrast. Multiplanar reformatted images are provided for review. MIP images are provided for review. Automated exposure control, iterative reconstruction, and/or weight based adjustment of the mA/kV was utilized to reduce the radiation dose to as low as reasonably achievable. COMPARISON: Chest radiograph 12/23/2024 and CT chest 12/08/2024. CLINICAL HISTORY: Pulmonary embolism (PE) suspected, low to intermediate prob, neg D-dimer. Pulmonary embolism suspected, low to intermediate probability, negative D-dimer. FINDINGS: PULMONARY ARTERIES: Moderate motion artifact. No large central pulmonary emboli are demonstrated. Evaluation of peripheral vessels is limited due to motion artifact, but no definitive pulmonary embolus is seen.  Main pulmonary artery is normal in caliber. MEDIASTINUM: Cardiac enlargement. Calcification of the mitral valve annulus. Calcification of coronary arteries and aorta. Aortic valve prosthesis. Cardiac pacemaker. Superior vena cava appears patent. 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 There is no acute abnormality of the thoracic aorta. LYMPH NODES: Mediastinal lymphadenopathy with pretracheal lymph nodes measuring up to 1.5 cm diameter, similar to prior study and possibly metastatic. No hilar or axillary lymphadenopathy. LUNGS AND PLEURA: Prominent fluid and debris within the mainstem bronchi and lower lobe bronchi bilaterally, new since the prior study, likely indicating aspiration. There is new airspace disease in the right upper lobe and progressing foci of consolidation in the lower lungs. Fibrous pulmonary nodules are similar  to the prior study. No pleural effusion or pneumothorax. UPPER ABDOMEN: No acute abnormalities demonstrated in the visualized upper abdomen. SOFT TISSUES AND BONES: Multiple collateral vessels demonstrated in the soft tissues of the right chest. Degenerative changes in the spine. Sclerosis of T1 and T2 possibly metastatic. Heterogeneous lucencies in the sternum and some vertebrae may also be metastatic. Anterior fixation of the visualized cervical spine. No acute soft tissue abnormality. IMPRESSION: 1. No large central pulmonary emboli are demonstrated. Evaluation of the peripheral vessels is limited by motion artifact, but no definitive pulmonary embolus is seen. 2. New airspace disease in the right upper lobe and progressing foci of consolidation in the lower lungs, with prominent fluid and debris within the bronchi, likely indicating aspiration. 3. 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 4. Mediastinal lymphadenopathy with pretracheal lymph nodes measuring up to 1.5 cm diameter, similar to prior study and possibly metastatic. 5. Sclerosis of T1 and T2 and heterogeneous lucencies in the sternum and visualized vertebrae, possibly metastatic. Electronically signed by: Elsie Gravely MD 12/23/2024 03:53 PM EST RP Workstation: [id number]   DG Chest Port 1 View Result Date: 12/23/2024 CLINICAL DATA:  Shortness of breath.  Weakness. EXAM: PORTABLE CHEST - 1 VIEW COMPARISON:  12/08/2024 FINDINGS: Cardiomediastinal silhouette and pulmonary vasculature are within normal limits. Lungs are clear. Postsurgical changes of RIGHT shoulder replacement, pacemaker, ACDF, and prosthetic aortic valve are again seen. IMPRESSION: No acute cardiopulmonary process. Electronically Signed   By: Aliene Lloyd M.D.   On: 12/23/2024 14:16   Data Reviewed: Relevant notes from primary care and specialist visits, past discharge summaries as  available in EHR, including Care Everywhere . Prior diagnostic testing as pertinent to current admission diagnoses, Updated medications and problem lists for reconciliation .ED course, including vitals, labs, imaging, treatment and response to treatment,Triage notes, nursing and pharmacy notes and ED provider's notes.Notable results as noted in HPI.Discussed case with EDMD/ ED APP/ or Specialty MD on call and as needed.  Assessment & Plan  >>RUL PNEUMONIA: Will evaluate patient with bedside swallow formal swallow evaluation MRI of the brain with and without contrast.  Aspiration precaution . N.p.o. currently.  Continue with broad-spectrum IV antibiotic coverage will consult pharmacy secondary to multiple allergies.  Will do a bedside swallow evaluation and if patient passes will start patient on a dysphagia soft diet.  >> Peritracheal mass: [place] with radiology Dr. Gravely at [facility] radiology who reviewed both the studies , this was present on previous study , and based on location would not cause difficulty in swallowing or breathing. Will defer to discharging team to establish with oncology.  >> Seizure disorder: Patient was found  to have a focal seizure after her intracranial hemorrhage and since then has been on Keppra .  Seizure precautions fall precautions continue Keppra .  >> Diabetes mellitus type 1: Poorly controlled with A1c above 9. Lantus  increased to 15 units twice daily, Accu-Cheks every 4 hours. LR at low rate for continued ongoing hydration.  >> Essential hypertension: Vitals:   12/23/24 1243 12/23/24 1459 12/23/24 1650 12/23/24 1845  BP: (!) 132/51 (!) 146/55 (!) 137/45 (!) 117/40  PTA meds include metoprolol  25 mg.  >> Anemia:    Latest Ref Rng & Units 12/23/2024    1:10 PM 12/23/2024    1:09 PM 12/23/2024   12:53 PM  CBC  WBC 4.0 - 10.5 K/uL   15.1   Hemoglobin 12.0 - 15.0 g/dL 89.4  89.4  9.5   Hematocrit 36.0 - 46.0 % 31.0  31.0  29.9   Platelets 150 - 400  K/uL   355   Type/screen iv ppi.    >> History of GI bleed: IV PPI. Follow h/h.    >> History of heart block status post pacemaker placement in 2020: Interrogation as deemed appropriate.   >> History of ICH: SCD's.Fall precaution.   >>Right hip fracture s/p repair: PT consult.    DVT prophylaxis:  Scd's.  Consults:  None. PT.   Advance Care Planning:    Code Status: Limited: Do not attempt resuscitation (DNR) -DNR-LIMITED -Do Not Intubate/DNI    Family Communication:  Daughter.  Disposition Plan:  TBD.  Severity of Illness: The appropriate patient status for this patient is INPATIENT. Inpatient status is judged to be reasonable and necessary in order to provide the required intensity of service to ensure the patient's safety. The patient's presenting symptoms, physical exam findings, and initial radiographic and laboratory data in the context of their chronic comorbidities is felt to place them at high risk for further clinical deterioration. Furthermore, it is not anticipated that the patient will be medically stable for discharge from the hospital within 2 midnights of admission.   * I certify that at the point of admission it is my clinical judgment that the patient will require inpatient hospital care spanning beyond 2 midnights from the point of admission due to high intensity of service, high risk for further deterioration and high frequency of surveillance required.*  Unresulted Labs (From admission, onward)     Start     Ordered   12/24/24 0500  Comprehensive metabolic panel  Tomorrow morning,   R        12/23/24 1805   12/24/24 0500  CBC  Tomorrow morning,   R        12/23/24 1805   12/24/24 0500  Magnesium   Tomorrow morning,   R        12/23/24 1805   12/23/24 1824  Magnesium   Add-on,   AD        12/23/24 1823   12/23/24 1756  MRSA Next Gen by PCR, Nasal  Once,   URGENT        12/23/24 1755   12/23/24 1253  Levetiracetam  level  Once,   URGENT         12/23/24 1252   12/23/24 1242  Culture, blood (routine x 2)  BLOOD CULTURE X 2,   R      12/23/24 1242           Meds ordered this encounter  Medications   iohexol  (OMNIPAQUE ) 350 MG/ML injection 75 mL   levofloxacin  (LEVAQUIN ) IVPB 750  mg    Antibiotic Indication::   CAP   DISCONTD: heparin  injection 5,000 Units   sodium chloride  flush (NS) 0.9 % injection 3 mL   OR Linked Order Group    acetaminophen  (TYLENOL ) tablet 650 mg    acetaminophen  (TYLENOL ) suppository 650 mg   morphine  (PF) 2 MG/ML injection 2 mg   lactated ringers  infusion   insulin  glargine-yfgn injection 15 Units   montelukast  (SINGULAIR ) tablet 10 mg   simvastatin  (ZOCOR ) tablet 40 mg   hydrALAZINE  (APRESOLINE ) injection 5 mg   ceFEPIme  (MAXIPIME ) 2 g in sodium chloride  0.9 % 100 mL IVPB    Antibiotic Indication::   HCAP   insulin  aspart (novoLOG ) injection 0-15 Units    Correction coverage::   Moderate (average weight, post-op)    CBG < 70::   Implement Hypoglycemia Standing Orders and refer to Hypoglycemia Standing Orders sidebar report    CBG 70 - 120::   0 units    CBG 121 - 150::   2 units    CBG 151 - 200::   3 units    CBG 201 - 250::   5 units    CBG 251 - 300::   8 units    CBG 301 - 350::   11 units    CBG 351 - 400::   15 units    CBG > 400:   call MD and obtain STAT lab verification     Orders Placed This Encounter  Procedures   Culture, blood (routine x 2)   Resp panel by RT-PCR (RSV, Flu A&B, Covid) Anterior Nasal Swab   MRSA Next Gen by PCR, Nasal   DG Chest Port 1 View   CT Angio Chest PE W and/or Wo Contrast   MR BRAIN WO CONTRAST   CBC with Differential   Pro Brain natriuretic peptide   Comprehensive metabolic panel   Protime-INR   Urinalysis, w/ Reflex to Culture (Infection Suspected) -Urine, Clean Catch   Levetiracetam  level   CK   Comprehensive metabolic panel   CBC   Magnesium    Magnesium    In and Out Cath   Swallow screen   Maintain IV access   Vital signs   Notify  physician (specify)   Mobility Protocol: No Restrictions   Refer to Sidebar Report Mobility Protocol for Adult Inpatient   Initiate Adult Central Line Maintenance and Catheter Clearance Protocol for patients with central line (CVC, PICC, Port, Hemodialysis, Trialysis)   Daily weights   Intake and Output   Initiate CHG Protocol for patients in ICU/SD or any patient with a central line or foley catheter   Do not place and if present remove PureWick   Initiate Oral Care Protocol   Initiate Carrier Fluid Protocol   RN may order General Admission PRN Orders utilizing General Admission PRN medications (through manage orders) for the following patient needs: allergy symptoms (Claritin ), cold sores (Carmex), cough (Robitussin DM), eye irritation (Liquifilm Tears), hemorrhoids (Tucks), indigestion (Maalox), minor skin irritation (Hydrocortisone Cream), muscle pain (Ben Gay), nose irritation (saline nasal spray) and sore throat (Chloraseptic spray).   Cardiac Monitoring Continuous x 48 hours Indications for use: Other; Other indications for use: PNA.   Ambulate with assistance   Apply Diabetes Mellitus Care Plan   STAT CBG when hypoglycemia is suspected. If treated, recheck every 15 minutes after each treatment until CBG >/= 70 mg/dl   Refer to Hypoglycemia Protocol Sidebar Report for treatment of CBG < 70 mg/dl   Do not attempt resuscitation (  DNR)- Limited -Do Not Intubate (DNI)   Consult to Palliative Care   Consult to hospitalist   Patient needs PT consult as soon as possible.  Discharge is pending PT consult.  Please call  PT eval and treat   Pulse oximetry check with vital signs   Oxygen therapy Mode or (Route): Nasal cannula; Liters Per Minute: 2; Keep O2 saturation between: greater than 92 %   Pulse oximetry, continuous   SLP eval and treat Reason for evaluation: .Swallowing evaluation (BSE, MBS and/or diet order as indicated), Modified Barium Swallow Study and diet order as indicated   I-stat  chem 8, ED   I-Stat venous blood gas, ED   CBG monitoring, ED   ED EKG   EKG 12-Lead   Type and screen [facility]   Saline lock IV   Insert peripheral IV   Admit to Inpatient (patient's expected length of stay will be greater than 2 midnights or inpatient only procedure)   Aspiration precautions   Fall precautions   Seizure precautions    Author: Mario LULLA Blanch, MD 12 pm- 8 pm. [facility]. 12/23/2024 7:29 PM Please note for any communication after hours contact [facility] Assigned provider on call on Amion.     [1]  Allergies Allergen Reactions   Augmentin [Amoxicillin-Pot Clavulanate] Anaphylaxis   Ceclor [Cefaclor] Anaphylaxis    Tolerated cephalexin  in 2018   Firvanq  [Vancomycin ] Anaphylaxis   Sumycin [Tetracycline] Anaphylaxis   Accupril  [Quinapril  Hcl] Other (See Comments)    Hyperkalemia   Bactrim  [Sulfamethoxazole -Trimethoprim ] Other (See Comments)    CKD - decreased kidney function   Fosamax [Alendronate Sodium] Other (See Comments)    Unknown reaction   Nsaids Other (See Comments)    CKD   Deltasone [Prednisone] Other (See Comments)    Unknown reaction   "

## 2024-12-24 ENCOUNTER — Inpatient Hospital Stay (HOSPITAL_COMMUNITY)

## 2024-12-24 DIAGNOSIS — N1832 Chronic kidney disease, stage 3b: Secondary | ICD-10-CM

## 2024-12-24 DIAGNOSIS — R531 Weakness: Secondary | ICD-10-CM | POA: Diagnosis not present

## 2024-12-24 DIAGNOSIS — J690 Pneumonitis due to inhalation of food and vomit: Secondary | ICD-10-CM | POA: Diagnosis not present

## 2024-12-24 DIAGNOSIS — L8990 Pressure ulcer of unspecified site, unspecified stage: Secondary | ICD-10-CM | POA: Insufficient documentation

## 2024-12-24 DIAGNOSIS — I10 Essential (primary) hypertension: Secondary | ICD-10-CM | POA: Diagnosis not present

## 2024-12-24 LAB — GLUCOSE, CAPILLARY
Glucose-Capillary: 108 mg/dL — ABNORMAL HIGH (ref 70–99)
Glucose-Capillary: 126 mg/dL — ABNORMAL HIGH (ref 70–99)
Glucose-Capillary: 39 mg/dL — CL (ref 70–99)
Glucose-Capillary: 75 mg/dL (ref 70–99)
Glucose-Capillary: 77 mg/dL (ref 70–99)
Glucose-Capillary: 85 mg/dL (ref 70–99)
Glucose-Capillary: 95 mg/dL (ref 70–99)

## 2024-12-24 LAB — LEVETIRACETAM LEVEL: Levetiracetam Lvl: 17 ug/mL (ref 10.0–40.0)

## 2024-12-24 MED ORDER — LEVETIRACETAM (KEPPRA) 500 MG/5 ML ADULT IV PUSH
250.0000 mg | Freq: Two times a day (BID) | INTRAVENOUS | Status: DC
  Administered 2024-12-24 (×2): 250 mg via INTRAVENOUS
  Filled 2024-12-24 (×3): qty 5

## 2024-12-24 MED ORDER — INSULIN GLARGINE-YFGN 100 UNIT/ML ~~LOC~~ SOLN
5.0000 [IU] | Freq: Every day | SUBCUTANEOUS | Status: DC
  Filled 2024-12-24: qty 0.05

## 2024-12-24 MED ORDER — INSULIN GLARGINE-YFGN 100 UNIT/ML ~~LOC~~ SOLN
10.0000 [IU] | Freq: Every day | SUBCUTANEOUS | Status: DC
  Filled 2024-12-24: qty 0.1

## 2024-12-24 MED ORDER — DEXTROSE 50 % IV SOLN
25.0000 g | INTRAVENOUS | Status: AC
  Administered 2024-12-24: 25 g via INTRAVENOUS
  Filled 2024-12-24: qty 50

## 2024-12-24 NOTE — NC FL2 (Cosign Needed Addendum)
 " [place]  MEDICAID [id number] LEVEL OF CARE FORM     IDENTIFICATION  Patient Name: Danielle Clarke Birthdate: 10-01-1941 Sex: female Admission Date (Current Location): 12/23/2024  [facility] [place] and [place] Number:  Producer, Television/film/video and Address:  [facility], [address] [place], [place] [address]      Provider Number: [id number]  Attending Physician Name and Address:  Odell Castor, Erle, MD  Relative Name and Phone Number:  Shanya Ferriss (daughter) [phone]    Current Level of Care: Hospital Recommended Level of Care: Skilled Nursing Facility Prior Approval Number:    Date Approved/Denied:   PASRR Number: [id number]  Discharge Plan: SNF    Current Diagnoses: Patient Active Problem List   Diagnosis Date Noted   Pressure injury of skin 12/24/2024   Pneumonia 12/23/2024   Acute respiratory failure with hypoxia (HCC) 12/08/2024   Mass of upper lobe of right lung 12/08/2024   Closed right hip fracture (HCC) 11/23/2024   Seizures (HCC) 11/23/2024   Protein-calorie malnutrition, severe 04/03/2024   Acute hypoxic respiratory failure (HCC) 04/01/2024   Compression fracture of T12 vertebra (HCC) 04/01/2024   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   Closed comminuted left humeral fracture 11/19/2021   CKD (chronic kidney disease), stage III (HCC) 10/14/2020   Abnormality of gait 12/01/2019   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) 12/01/2019   Traumatic brain injury with loss of consciousness (HCC) 05/28/2019   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 05/20/2019   Labile blood pressure    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    Multiple closed fractures of ribs of right side    Hypoalbuminemia due to protein-calorie malnutrition    Traumatic cerebral intraparenchymal hematoma (HCC) 02/14/2019   Pacemaker    Labile blood glucose    Dyslipidemia    Anemia of chronic  disease    Intracerebral hemorrhage (HCC) 02/07/2019   Intracranial bleeding (HCC) 02/06/2019   Aortic stenosis 08/25/2015   Cervical stenosis of spinal canal 07/31/2014   Essential hypertension, benign 12/30/2013   Loss of consciousness (HCC) 01/02/2013   Diabetes mellitus type 1 (HCC) 01/02/2013   Bilateral carotid artery disease 01/02/2013   GOITER, MULTINODULAR 02/11/2008    Orientation RESPIRATION BLADDER Height & Weight     Self, Time, Situation, Place   3 liters Nasal Cannula Incontinent Weight: 82 lb 10.8 oz (37.5 kg) Height:  5' (152.4 cm)  BEHAVIORAL SYMPTOMS/MOOD NEUROLOGICAL BOWEL NUTRITION STATUS      Continent Diet (Please see DC summary)  AMBULATORY STATUS COMMUNICATION OF NEEDS Skin   Limited Assist Verbally Other (Comment) (Erythema,Buttocks,Bil.,Wound/Incision LDAs,Wound PI,sacrum,posterior,stage 1,Wound surgical closed Incision,hip,R,Wound surgical closed Incision,Elbow,R)                       Personal Care Assistance Level of Assistance  Bathing, Feeding, Dressing Bathing Assistance: Maximum assistance Feeding assistance: Independent Dressing Assistance: Maximum assistance     Functional Limitations Info    Sight Info: Impaired Hearing Info: Impaired Speech Info: Adequate    SPECIAL CARE FACTORS FREQUENCY  PT (By licensed PT), OT (By licensed OT)     PT Frequency: 5x min weekly OT Frequency: 5x min weekly            Contractures Contractures Info: Not present    Additional Factors Info  Code Status, Allergies, Psychotropic, Insulin  Sliding Scale Code Status Info: DNR Allergies Info: Augmentin (amoxicillin-pot Clavulanate),Ceclor (cefaclor),Firvanq  (vancomycin ),Sumycin (  tetracycline),Accupril  (quinapril  Hcl),Bactrim  (sulfamethoxazole -trimethoprim ),Fosamax (alendronate Sodium),Nsaids,Deltasone (prednisone) Psychotropic Info: levETIRAcetam  (KEPPRA ) undiluted injection 250 mg every 12 hours Insulin  Sliding Scale Info: Please see DC summary        Current Medications (12/24/2024):  This is the current hospital active medication list Current Facility-Administered Medications  Medication Dose Route Frequency Provider Last Rate Last Admin   acetaminophen  (TYLENOL ) tablet 650 mg  650 mg Oral Q6H PRN Patel, Ekta V, MD       Or   acetaminophen  (TYLENOL ) suppository 650 mg  650 mg Rectal Q6H PRN Patel, Ekta V, MD       ceFEPIme  (MAXIPIME ) 2 g in sodium chloride  0.9 % 100 mL IVPB  2 g Intravenous Q12H Tobie Mario GAILS, MD       hydrALAZINE  (APRESOLINE ) injection 5 mg  5 mg Intravenous Q4H PRN Patel, Ekta V, MD       insulin  aspart (novoLOG ) injection 0-15 Units  0-15 Units Subcutaneous Q4H Tobie Mario GAILS, MD   2 Units at 12/24/24 0038   insulin  glargine-yfgn injection 5 Units  5 Units Subcutaneous Daily Odell Celinda Balo, MD       lactated ringers  infusion   Intravenous Continuous Tobie Mario GAILS, MD 40 mL/hr at 12/23/24 2051 New Bag at 12/23/24 2051   levETIRAcetam  (KEPPRA ) undiluted injection 250 mg  250 mg Intravenous Q12H Odell Celinda Balo, MD   250 mg at 12/24/24 1042   montelukast  (SINGULAIR ) tablet 10 mg  10 mg Oral QHS Patel, Ekta V, MD   10 mg at 12/23/24 2141   morphine  (PF) 2 MG/ML injection 2 mg  2 mg Intravenous Q4H PRN Patel, Ekta V, MD       simvastatin  (ZOCOR ) tablet 40 mg  40 mg Oral QHS Patel, Ekta V, MD   40 mg at 12/23/24 2141   sodium chloride  flush (NS) 0.9 % injection 3 mL  3 mL Intravenous Q12H Tobie Mario GAILS, MD   3 mL at 12/24/24 1049     Discharge Medications: Please see discharge summary for a list of discharge medications.  Relevant Imaging Results:  Relevant Lab Results:   Additional Information [id number]  Isaiah Public, LCSWA     "

## 2024-12-24 NOTE — TOC Initial Note (Addendum)
 Transition of Care [facility]) - Initial/Assessment Note    Patient Details  Name: Danielle Clarke MRN: [id number] Date of Birth: 1941/05/22  [facility] CM/SW Contact:    Isaiah Public, LCSWA Phone Number: 12/24/2024, 12:26 PM  Clinical Narrative:                  CSW received consult for possible SNF placement at time of discharge. CSW spoke with patient at bedside regarding PT recommendation of SNF placement at time of discharge. PTA patient reports she was receiving short term rehab at [facility]. Patient expressed understanding of PT recommendation and is agreeable to SNF placement at time of discharge. Patient would like to return to [facility] for short term rehab.Patient gave permission for CSW to send initial referral to [facility] for SNF placement.CSW discussed insurance authorization process. Patient gave CSW permission to update her daughter [name] on her DC plan.All questions answered. No further questions reported at this time. CSW to continue to follow and assist with discharge planning needs.   Update- CSW spoke with Star with [facility] who confirmed SNF bed for patient. [facility] SN[facility]. CSW requested for Jon CMA to start insurance authorization for patient.  Update- Jon CMA informed CSW that patients insurance authorization is currently pending Mayme Barrows ID# [id number]. Star with [facility] confirmed SNF bed for patient.  Expected Discharge Plan: Skilled Nursing Facility Barriers to Discharge: Continued Medical Work up   Patient Goals and CMS Choice Patient states their goals for this hospitalization and ongoing recovery are:: to return to [facility] for short term rehab   Choice offered to / list presented to : Patient      Expected Discharge Plan and Services In-house Referral: Clinical Social Work   Post Acute Care Choice: Skilled Nursing Facility Living arrangements for the past 2 months:  (came from short term  rehab at [facility].)                                      Prior Living Arrangements/Services Living arrangements for the past 2 months:  (came from short term rehab at [facility] Lives with:: Self Patient language and need for interpreter reviewed:: Yes Do you feel safe going back to the place where you live?: No   SNF  Need for Family Participation in Patient Care: Yes (Comment) Care giver support system in place?: Yes (comment)   Criminal Activity/Legal Involvement Pertinent to Current Situation/Hospitalization: No - Comment as needed  Activities of Daily Living   ADL Screening (condition at time of admission) Independently performs ADLs?: No Does the patient have a NEW difficulty with bathing/dressing/toileting/self-feeding that is expected to last >3 days?: Yes (Initiates electronic notice to provider for possible OT consult) Does the patient have a NEW difficulty with getting in/out of bed, walking, or climbing stairs that is expected to last >3 days?: Yes (Initiates electronic notice to provider for possible PT consult) Does the patient have a NEW difficulty with communication that is expected to last >3 days?: Yes (Initiates electronic notice to provider for possible SLP consult) Is the patient deaf or have difficulty hearing?: Yes Does the patient have difficulty seeing, even when wearing glasses/contacts?: Yes Does the patient have difficulty concentrating, remembering, or making decisions?: Yes  Permission Sought/Granted Permission sought to share information with : Case Manager, Magazine Features Editor, Family Supports Permission granted  to share information with : Yes, Verbal Permission Granted  Share Information with NAME: [name]  Permission granted to share info w AGENCY: SNF/[facility]  Permission granted to share info w Relationship: daughter  Permission granted to share info w Contact Information: Teneshia Hedeen (daughter) [phone]  Emotional  Assessment Appearance:: Appears stated age Attitude/Demeanor/Rapport: Gracious Affect (typically observed): Calm Orientation: : Oriented to Self, Oriented to Place, Oriented to  Time, Oriented to Situation Alcohol  / Substance Use: Not Applicable Psych Involvement: No (comment)  Admission diagnosis:  Pneumonia [J18.9] Patient Active Problem List   Diagnosis Date Noted   Pressure injury of skin 12/24/2024   Pneumonia 12/23/2024   Acute respiratory failure with hypoxia (HCC) 12/08/2024   Mass of upper lobe of right lung 12/08/2024   Closed right hip fracture (HCC) 11/23/2024   Seizures (HCC) 11/23/2024   Protein-calorie malnutrition, severe 04/03/2024   Acute hypoxic respiratory failure (HCC) 04/01/2024   Compression fracture of T12 vertebra (HCC) 04/01/2024   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   Closed comminuted left humeral fracture 11/19/2021   CKD (chronic kidney disease), stage III (HCC) 10/14/2020   Abnormality of gait 12/01/2019   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) 12/01/2019   Traumatic brain injury with loss of consciousness (HCC) 05/28/2019   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 05/20/2019   Labile blood pressure    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    Multiple closed fractures of ribs of right side    Hypoalbuminemia due to protein-calorie malnutrition    Traumatic cerebral intraparenchymal hematoma (HCC) 02/14/2019   Pacemaker    Labile blood glucose    Dyslipidemia    Anemia of chronic disease    Intracerebral hemorrhage (HCC) 02/07/2019   Intracranial bleeding (HCC) 02/06/2019   Aortic stenosis 08/25/2015   Cervical stenosis of spinal canal 07/31/2014   Essential hypertension, benign 12/30/2013   Loss of consciousness (HCC) 01/02/2013   Diabetes mellitus type 1 (HCC) 01/02/2013   Bilateral carotid artery disease 01/02/2013   GOITER, MULTINODULAR 02/11/2008    PCP:  [name], MD Pharmacy:  No Pharmacies Listed    Social Drivers of Health (SDOH) Social History: SDOH Screenings   Food Insecurity: No Food Insecurity (12/23/2024)  Housing: Low Risk (12/23/2024)  Transportation Needs: No Transportation Needs (12/23/2024)  Utilities: Patient Unable To Answer (12/23/2024)  Social Connections: Unknown (12/23/2024)  Tobacco Use: Low Risk (12/23/2024)   SDOH Interventions:     Readmission Risk Interventions    12/10/2024   12:01 PM 12/09/2024    3:42 PM 04/23/2024   11:16 AM  Readmission Risk Prevention Plan  Transportation Screening Complete  Complete  PCP or Specialist Appt within 5-7 Days Complete Complete Complete  Home Care Screening Complete Complete Complete  Medication Review (RN CM) Complete Complete Complete

## 2024-12-24 NOTE — TOC Progression Note (Cosign Needed)
 Transition of Care [facility]) - Progression Note    Patient Details  Name: Danielle Clarke MRN: [id number] Date of Birth: [birth year]  Transition of [facility]) CM/SW Contact  Clive JAYSON Sharps Phone Number: 12/24/2024, 12:59 PM  Clinical Narrative:     SW intern updated patients daughter [name] about patients DC plan per patients request. All question answered no further questions reported at this time .   Expected Discharge Plan: Skilled Nursing Facility Barriers to Discharge: Continued Medical Work up               Expected Discharge Plan and Services In-house Referral: Clinical Social Work   Post Acute Care Choice: Skilled Nursing Facility Living arrangements for the past 2 months:  (came from short term rehab at [facility]                                       Social Drivers of Health (SDOH) Interventions SDOH Screenings   Food Insecurity: No Food Insecurity (12/23/2024)  Housing: Low Risk (12/23/2024)  Transportation Needs: No Transportation Needs (12/23/2024)  Utilities: Patient Unable To Answer (12/23/2024)  Social Connections: Unknown (12/23/2024)  Tobacco Use: Low Risk (12/23/2024)    Readmission Risk Interventions    12/10/2024   12:01 PM 12/09/2024    3:42 PM 04/23/2024   11:16 AM  Readmission Risk Prevention Plan  Transportation Screening Complete  Complete  PCP or Specialist Appt within 5-7 Days Complete Complete Complete  Home Care Screening Complete Complete Complete  Medication Review (RN CM) Complete Complete Complete

## 2024-12-24 NOTE — Progress Notes (Addendum)
 Blood sugar 39. Orange juice and jello given to patient. Patient has been coughing some since she tried to eat some jello.  Will keep patient NPO. See MAR. Repeat blood sugar 108.

## 2024-12-24 NOTE — Inpatient Diabetes Management (Signed)
 Inpatient Diabetes Program Recommendations  AACE/ADA: New Consensus Statement on Inpatient Glycemic Control (2015)  Target Ranges:  Prepandial:   less than 140 mg/dL      Peak postprandial:   less than 180 mg/dL (1-2 hours)      Critically ill patients:  140 - 180 mg/dL   Lab Results  Component Value Date   GLUCAP 126 (H) 12/24/2024   HGBA1C 9.9 (H) 11/23/2024    Latest Reference Range & Units 12/14/24 08:04 12/14/24 11:43 12/23/24 18:18 12/23/24 20:30 12/23/24 23:26 12/24/24 04:16 12/24/24 05:03 12/24/24 08:08  Glucose-Capillary 70 - 99 mg/dL 89 836 (H) 730 (H) 764 (H) 135 (H) 39 (LL) 108 (H) 126 (H)  (LL): Data is critically low (H): Data is abnormally high  Diabetes history: DM1 Outpatient Diabetes medications: Lantus  8 BID, Novolog  0-12 units TID with meals and HS Current orders for Inpatient glycemic control: Semglee  5 daily, Novolog  0-15 TID with meals and 0-5 HS  Inpatient Diabetes Program Recommendations:   Please consider: -Decrease Novolog  correction to 0-6 q 4 hrs. While NPO, then tid, 0-5 units hs  Thank you, Farrell Pantaleo E. Asley Baskerville, RN, MSN, CNS, CDCES  Diabetes Coordinator Inpatient Glycemic Control Team Team Pager [phone] (8am-5pm) 12/24/2024 12:40 PM

## 2024-12-24 NOTE — Progress Notes (Signed)
" °  Device system confirmed to be MRI conditional, with implant date > 6 weeks ago, and no evidence of abandoned or epicardial leads in review of most recent CXR  Device last cleared by EP Provider: Daphne Barrack, on 12/24/24  Clearance is good through for 1 year as long as parameters remain stable at time of check. If pt undergoes a cardiac device procedure during that time, they should be re-cleared.   Tachy-therapies to be programmed off if applicable with device back to pre-MRI settings after completion of exam.  [employer] was available remotely to assist in programming recommendations.   Tobias LITTIE Leander, RT  12/24/2024 4:27 PM     "

## 2024-12-24 NOTE — CV Procedure (Signed)
" °  Device system confirmed to be MRI conditional, with implant date > 6 weeks ago, and no evidence of abandoned or epicardial leads in review of most recent CXR  Device last cleared by EP Provider: Daphne Barrack 12/24/2024  Clearance is good through for 1 year as long as parameters remain stable at time of check. If pt undergoes a cardiac device procedure during that time, they should be re-cleared.   Tachy-therapies to be programmed off if applicable with device back to pre-MRI settings after completion of exam.  [employer] was available remotely to assist in programming recommendations.   Rocky Catalan, RT  12/24/2024 9:48 AM     "

## 2024-12-24 NOTE — Progress Notes (Signed)
 [facility] PROGRESS NOTE    Progress Note  Danielle Clarke  FMW:[id number] DOB: 12-07-1940 DOA: 12/23/2024 PCP: [name], MD     Brief Narrative:   Danielle Clarke is an 84 y.o. female past medical history of aortic stenosis status post TAVR's, history of asthma, diabetes mellitus type 1, chronic kidney disease stage III A, essential hypertension history of left humeral fracture T12 compression fracture, prolonged QTc and history of intracranial bleed with a history of traumatic brain injury, seizure disorder discharged from the hospital for acute bronchitis, and recurrent pneumonias sent from nursing home for cough.  CT angio of the chest was done negative for PE but showed new airspace disease in the right upper lobe and a progressive foci of consolidation in the lower lung with prominent debris in the bronchi.  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  Multiple lymphadenopathy and paratracheal lymphadenopathy and sclerotic lesions at T1-T2 and sternum  Assessment/Plan:   Acute respiratory failure with hypoxia secondary to aspiration pneumonia: Check swallowing evaluation.  She has episode of recurrent pneumonias. Currently NPO. Started empirically on IV cefepime .  Right paratracheal mass with diffuse lymphadenopathy and sclerotic lesions of T1-T2 and sternum: On previous admission it was discussed with the daughter they would like not to pursue a biopsy In the past and has been referred to pulmonary as an outpatient. Likely malignant. Check MRI brain.  History of seizure disorder: Continue Keppra .  Diabetes mellitus type 1 (HCC): A1c of 9 continue long-acting insulin  per sliding scale.  Essential hypertension: Continue metoprolol .  History of GI bleed: Continue IV PPI.  History of heart block status post pacemaker in 2020: Noted.  History of intracranial bleed: Fall precaution.  Right  hip fracture status post repair: PT OT consulted.  Goals of care: Hospice follows her at home.   DVT prophylaxis: lovenox  Family Communication:daughter Status is: Inpatient Remains inpatient appropriate because: asp. PNA    Code Status:     Code Status Orders  (From admission, onward)           Start     Ordered   12/23/24 1827  Do not attempt resuscitation (DNR)- Limited -Do Not Intubate (DNI)  (Code Status)  Continuous       Question Answer Comment  If pulseless and not breathing No CPR or chest compressions.   In Pre-Arrest Conditions (Patient Is Breathing and Has A Pulse) Do not intubate. Provide all appropriate non-invasive medical interventions. Avoid ICU transfer unless indicated or required.   Consent: Discussion documented in EHR or advanced directives reviewed      12/23/24 1827           Code Status History     Date Active Date Inactive Code Status Order ID Comments User Context   12/23/2024 1805 12/23/2024 1827 Full Code [id number]  Tobie Mario GAILS, MD ED   12/08/2024 1740 12/14/2024 1838 Limited: Do not attempt resuscitation (DNR) -DNR-LIMITED -Do Not Intubate/DNI  [id number]  Celinda Alm Lot, MD ED   11/28/2024 [id number] 11/28/2024 1836 Limited: Do not attempt resuscitation (DNR) -DNR-LIMITED -Do Not Intubate/DNI  [id number]  Davia Nydia POUR, MD Inpatient   11/23/2024 1026 11/28/2024 0805 Full Code [id number]  Willette Adriana LABOR, MD ED   04/19/2024 1452 04/25/2024 1920 Limited: Do not attempt resuscitation (DNR) -DNR-LIMITED -Do Not Intubate/DNI  [id number]  Rodman Recardo NOVAK, NP Inpatient   04/15/2024 1945 04/19/2024 1452 Full Code [id number]  Shona Terry SAILOR, DO  ED   04/01/2024 1904 04/04/2024 2307 Full Code [id number]  Moody Alto, MD ED   02/06/2023 1040 02/07/2023 1928 Full Code [id number]  Arvil Kate BIRCH, NP Inpatient   01/11/2023 0923 01/11/2023 1624 Full Code [id number]  Wendel Lurena POUR, MD Inpatient   11/19/2021 1520 11/25/2021 1616 Full Code [id number]  Waddell Rake, MD ED    10/14/2020 1755 10/16/2020 1818 Full Code [id number]  Odell Celinda Balo, MD Inpatient   02/14/2019 1449 02/28/2019 1524 Full Code [id number]  Pegge Toribio PARAS, PA-C Inpatient   02/14/2019 1449 02/14/2019 1449 Full Code [id number]  Pegge Toribio PARAS, PA-C Inpatient   02/07/2019 0036 02/14/2019 1440 Full Code [id number]  Franky Redia SAILOR, MD ED   08/05/2017 1448 08/08/2017 1814 Full Code [id number]  Will Almarie MATSU, MD ED   01/04/2017 1819 01/07/2017 1438 Full Code [id number]  Kara Reaper, MD ED   07/31/2014 1654 08/05/2014 2030 Full Code [id number]  Leeann Catalina HERO, MD Inpatient   12/29/2013 2134 01/01/2014 1811 Full Code [id number]  Lonzell Emeline HERO, DO ED   01/02/2013 1835 01/03/2013 2131 Full Code [id number]  Canda Damien BRAVO, RN Inpatient      Advance Directive Documentation    Flowsheet Row Most Recent Value  Type of Advance Directive Healthcare Power of Attorney  Pre-existing out of facility DNR order (yellow form or pink MOST form) --  MOST Form in Place? --      IV Access:   Peripheral IV   Procedures and diagnostic studies:   CT Angio Chest PE W and/or Wo Contrast Result Date: 12/23/2024 EXAM: CTA of the Chest with contrast for PE 12/23/2024 03:39:00 PM TECHNIQUE: CTA of the chest was performed after the administration of intravenous contrast. Multiplanar reformatted images are provided for review. MIP images are provided for review. Automated exposure control, iterative reconstruction, and/or weight based adjustment of the mA/kV was utilized to reduce the radiation dose to as low as reasonably achievable. COMPARISON: Chest radiograph 12/23/2024 and CT chest 12/08/2024. CLINICAL HISTORY: Pulmonary embolism (PE) suspected, low to intermediate prob, neg D-dimer. Pulmonary embolism suspected, low to intermediate probability, negative D-dimer. FINDINGS: PULMONARY ARTERIES: Moderate motion artifact. No large central pulmonary emboli are demonstrated. Evaluation of peripheral vessels is limited  due to motion artifact, but no definitive pulmonary embolus is seen. Main pulmonary artery is normal in caliber. MEDIASTINUM: Cardiac enlargement. Calcification of the mitral valve annulus. Calcification of coronary arteries and aorta. Aortic valve prosthesis. Cardiac pacemaker. Superior vena cava appears patent. 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 There is no acute abnormality of the thoracic aorta. LYMPH NODES: Mediastinal lymphadenopathy with pretracheal lymph nodes measuring up to 1.5 cm diameter, similar to prior study and possibly metastatic. No hilar or axillary lymphadenopathy. LUNGS AND PLEURA: Prominent fluid and debris within the mainstem bronchi and lower lobe bronchi bilaterally, new since the prior study, likely indicating aspiration. There is new airspace disease in the right upper lobe and progressing foci of consolidation in the lower lungs. Fibrous pulmonary nodules are similar to the prior study. No pleural effusion or pneumothorax. UPPER ABDOMEN: No acute abnormalities demonstrated in the visualized upper abdomen. SOFT TISSUES AND BONES: Multiple collateral vessels demonstrated in the soft tissues of the right chest. Degenerative changes in the spine. Sclerosis of T1 and T2 possibly metastatic. Heterogeneous lucencies in the sternum and some vertebrae may also be metastatic. Anterior fixation of the visualized cervical spine. No acute soft tissue abnormality. IMPRESSION: 1.  No large central pulmonary emboli are demonstrated. Evaluation of the peripheral vessels is limited by motion artifact, but no definitive pulmonary embolus is seen. 2. New airspace disease in the right upper lobe and progressing foci of consolidation in the lower lungs, with prominent fluid and debris within the bronchi, likely indicating aspiration. 3. Right upper paratracheal mass lesion measuring about 2.9 cm in diameter with  extension into the upper mediastinum, likely malignant. 4. Mediastinal lymphadenopathy with pretracheal lymph nodes measuring up to 1.5 cm diameter, similar to prior study and possibly metastatic. 5. Sclerosis of T1 and T2 and heterogeneous lucencies in the sternum and visualized vertebrae, possibly metastatic. Electronically signed by: Elsie Gravely MD 12/23/2024 03:53 PM EST RP Workstation: [id number]   DG Chest Port 1 View Result Date: 12/23/2024 CLINICAL DATA:  Shortness of breath.  Weakness. EXAM: PORTABLE CHEST - 1 VIEW COMPARISON:  12/08/2024 FINDINGS: Cardiomediastinal silhouette and pulmonary vasculature are within normal limits. Lungs are clear. Postsurgical changes of RIGHT shoulder replacement, pacemaker, ACDF, and prosthetic aortic valve are again seen. IMPRESSION: No acute cardiopulmonary process. Electronically Signed   By: Aliene Lloyd M.D.   On: 12/23/2024 14:16     Medical Consultants:   None.   Subjective:    Danielle Clarke no complaints this morning  Objective:    Vitals:   12/23/24 1952 12/23/24 1956 12/23/24 2327 12/24/24 0411  BP:  (!) 127/48 (!) 111/48 128/68  Pulse:  100 92 96  Resp:   16 16  Temp:   100.1 F (37.8 C) 99.8 F (37.7 C)  TempSrc:   Oral Oral  SpO2:  93% 97% 93%  Weight: 37.8 kg   37.5 kg  Height: 5' (1.524 m)      SpO2: 93 %  No intake or output data in the 24 hours ending 12/24/24 0630 Filed Weights   12/23/24 1952 12/24/24 0411  Weight: 37.8 kg 37.5 kg    Exam: General exam: In no acute distress. Respiratory system: Good air movement and crackles on the right lower lobe and left Cardiovascular system: S1 & S2 heard, RRR. No JVD. Gastrointestinal system: Abdomen is nondistended, soft and nontender.  Extremities: No pedal edema. Skin: No rashes, lesions or ulcers Psychiatry: Judgement and insight appear normal. Mood & affect appropriate.    Data Reviewed:    Labs: Basic Metabolic Panel: Recent Labs  Lab  12/23/24 1253 12/23/24 1309 12/23/24 1310 12/23/24 1458  NA 133* 133* 133*  --   K 5.0 4.7 4.7  --   CL 97* 100  --   --   CO2 25  --   --   --   GLUCOSE 200* 204*  --   --   BUN 28* 33*  --   --   CREATININE 0.58 0.60  --   --   CALCIUM 8.8*  --   --   --   MG  --   --   --  2.0   GFR Estimated Creatinine Clearance: 32.1 mL/min (by C-G formula based on SCr of 0.6 mg/dL). Liver Function Tests: Recent Labs  Lab 12/23/24 1253 12/23/24 1458  AST 29 27  ALT 18 21  ALKPHOS 144* 144*  BILITOT 0.3 0.3  PROT 7.3 7.2  ALBUMIN  3.0* 3.0*   No results for input(s): LIPASE, AMYLASE in the last 168 hours. No results for input(s): AMMONIA in the last 168 hours. Coagulation profile Recent Labs  Lab 12/23/24 1253  INR 1.1   COVID-19 Labs  No results  for input(s): DDIMER, FERRITIN, LDH, CRP in the last 72 hours.  Lab Results  Component Value Date   SARSCOV2NAA NEGATIVE 12/23/2024   SARSCOV2NAA NEGATIVE 12/08/2024   SARSCOV2NAA NEGATIVE 04/15/2024   SARSCOV2NAA NEGATIVE 04/01/2024    CBC: Recent Labs  Lab 12/23/24 1253 12/23/24 1309 12/23/24 1310  WBC 15.1*  --   --   NEUTROABS 12.9*  --   --   HGB 9.5* 10.5* 10.5*  HCT 29.9* 31.0* 31.0*  MCV 82.8  --   --   PLT 355  --   --    Cardiac Enzymes: Recent Labs  Lab 12/23/24 1458  CKTOTAL 20*   BNP (last 3 results) Recent Labs    12/08/24 1218 12/23/24 1253  PROBNP 1,201.0* 1,849.0*   CBG: Recent Labs  Lab 12/23/24 1818 12/23/24 2030 12/23/24 2326 12/24/24 0416 12/24/24 0503  GLUCAP 269* 235* 135* 39* 108*   D-Dimer: No results for input(s): DDIMER in the last 72 hours. Hgb A1c: No results for input(s): HGBA1C in the last 72 hours. Lipid Profile: No results for input(s): CHOL, HDL, LDLCALC, TRIG, CHOLHDL, LDLDIRECT in the last 72 hours. Thyroid  function studies: No results for input(s): TSH, T4TOTAL, T3FREE, THYROIDAB in the last 72 hours.  Invalid input(s):  FREET3 Anemia work up: No results for input(s): VITAMINB12, FOLATE, FERRITIN, TIBC, IRON, RETICCTPCT in the last 72 hours. Sepsis Labs: Recent Labs  Lab 12/23/24 1253 12/23/24 1309  WBC 15.1*  --   LATICACIDVEN  --  1.3   Microbiology Recent Results (from the past 240 hours)  Resp panel by RT-PCR (RSV, Flu A&B, Covid)     Status: None   Collection Time: 12/23/24 12:54 PM   Specimen: Nasal Swab  Result Value Ref Range Status   SARS Coronavirus 2 by RT PCR NEGATIVE NEGATIVE Final   Influenza A by PCR NEGATIVE NEGATIVE Final   Influenza B by PCR NEGATIVE NEGATIVE Final    Comment: (NOTE) The Xpert Xpress SARS-CoV-2/FLU/RSV plus assay is intended as an aid in the diagnosis of influenza from Nasopharyngeal swab specimens and should not be used as a sole basis for treatment. Nasal washings and aspirates are unacceptable for Xpert Xpress SARS-CoV-2/FLU/RSV testing.  Fact Sheet for Patients: bloggercourse.com  Fact Sheet for Healthcare Providers: seriousbroker.it  This test is not yet approved or cleared by the [place]  FDA and has been authorized for detection and/or diagnosis of SARS-CoV-2 by FDA under an Emergency Use Authorization (EUA). This EUA will remain in effect (meaning this test can be used) for the duration of the COVID-19 declaration under Section 564(b)(1) of the Act, 21 U.S.C. section 360bbb-3(b)(1), unless the authorization is terminated or revoked.     Resp Syncytial Virus by PCR NEGATIVE NEGATIVE Final    Comment: (NOTE) Fact Sheet for Patients: bloggercourse.com  Fact Sheet for Healthcare Providers: seriousbroker.it  This test is not yet approved or cleared by the [place]  FDA and has been authorized for detection and/or diagnosis of SARS-CoV-2 by FDA under an Emergency Use Authorization (EUA). This EUA will remain in effect  (meaning this test can be used) for the duration of the COVID-19 declaration under Section 564(b)(1) of the Act, 21 U.S.C. section 360bbb-3(b)(1), unless the authorization is terminated or revoked.  Performed at [facility], [address] [place], [place] [address]      Medications:    insulin  aspart  0-15 Units Subcutaneous Q4H   insulin  glargine-yfgn  15 Units Subcutaneous BID   levETIRAcetam   250 mg  Oral BID   montelukast   10 mg Oral QHS   simvastatin   40 mg Oral QHS   sodium chloride  flush  3 mL Intravenous Q12H   Continuous Infusions:  ceFEPime  (MAXIPIME ) IV     lactated ringers  40 mL/hr at 12/23/24 2051      LOS: 1 day   Danielle Clarke  [facility]  12/24/2024, 6:30 AM

## 2024-12-24 NOTE — Evaluation (Signed)
 Physical Therapy Evaluation Patient Details Name: Danielle Clarke MRN: [id number] DOB: [birth year] Today's Date: 12/24/2024  History of Present Illness  84 y.o. female presents to [facility] 12/23/24 from [facility] with cough. Admitted with acute respiratory failure w/ hypoxia 2/2 aspiration PNA. Also with R paratracheal mass w/ diffuse lymphadenopathy and sclerotic lesions of T1-T2 and sternum. 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 PMH:  DM1, HTN, HLD, aortic stenosis, s/p TAVR, seizures, recurrent falls, multiple bone fractures in the past, hemorrhagic stroke in 2020, progressive weakness, generalized cachexia, under hospice care at home.   Clinical Impression  Pt admitted from [facility] where she had assist for all mobility and ADLs. Pt was working with PT on ambulating with use of RW. Pt presents close to baseline with R>L LE weakness, impaired balance/gait pattern, and decreased activity tolerance. Pt required MinA/ModA for bed mobility and MinA to stand with RW. Able to take side-steps towards the [facility] with MinA for stability. Continue to recommend <3hrs post acute rehab to reduce risk of future falls and reduce caregiver burden. Acute PT to follow.         If plan is discharge home, recommend the following: A lot of help with bathing/dressing/bathroom;Assistance with cooking/housework;Help with stairs or ramp for entrance;Assist for transportation;A lot of help with walking and/or transfers   Can travel by private vehicle   Yes    Equipment Recommendations None recommended by PT     Functional Status Assessment Patient has had a recent decline in their functional status and demonstrates the ability to make significant improvements in function in a reasonable and predictable amount of time.     Precautions / Restrictions Precautions Precautions: Fall Recall of Precautions/Restrictions: Impaired Restrictions Weight Bearing Restrictions Per Provider  Order: No      Mobility  Bed Mobility Overal bed mobility: Needs Assistance Bed Mobility: Supine to Sit, Sit to Supine    Supine to sit: Mod assist, HOB elevated Sit to supine: Min assist   General bed mobility comments: Able to move towards EOB with CGA, however, required ModA to scoot hips forward to have feet flat on the ground. MinA for return to supine for LE management    Transfers Overall transfer level: Needs assistance Equipment used: Rolling walker (2 wheels) Transfers: Sit to/from Stand Sit to Stand: Min assist    General transfer comment: MinA for slight boost-up.    Ambulation/Gait Ambulation/Gait assistance: Min assist Gait Distance (Feet): 4 Feet Assistive device: Rolling walker (2 wheels) Gait Pattern/deviations: Step-to pattern Gait velocity: decr     General Gait Details: able to take side-steps towards HOB with MinA for steadying assist. Cues to lean forwards as pt kept weight in heels     Balance Overall balance assessment: Needs assistance Sitting-balance support: Bilateral upper extremity supported, Feet supported Sitting balance-Leahy Scale: Poor     Standing balance support: Bilateral upper extremity supported, During functional activity, Reliant on assistive device for balance Standing balance-Leahy Scale: Poor Standing balance comment: reliant on UE and external support       Pertinent Vitals/Pain Pain Assessment Pain Assessment: Faces Faces Pain Scale: Hurts a little bit Pain Location: R hip Pain Descriptors / Indicators: Aching, Discomfort, Grimacing, Guarding Pain Intervention(s): Limited activity within patient's tolerance, Monitored during session, Repositioned    Home Living Family/patient expects to be discharged to:: Private residence Living Arrangements: Alone Available Help at Discharge: Family;Personal care attendant Type of Home: House Home Access: Stairs to enter Entrance Stairs-Rails: Right  Entrance Stairs-Number of  Steps: 2   Home Layout: One level Home Equipment: BSC/3in1;Cane - single point;Shower seat;Rolling Environmental Consultant (2 wheels) Additional Comments: daughter lives next door, is a flight attendant so at home intermittently. Pt takes care of her cat. Daughter in law assists sometimes, PCA assists 4-5X as needed (info from prior admission)    Prior Function Prior Level of Function : Needs assist    Mobility Comments: Has had assist for all mobility at SNF. Ambulating with RW ADLs Comments: At SNF, has been having assist for ADLs. Prior to fall, pt had assist for showers     Extremity/Trunk Assessment   Upper Extremity Assessment Upper Extremity Assessment: Defer to OT evaluation    Lower Extremity Assessment Lower Extremity Assessment: Generalized weakness (R>L)    Cervical / Trunk Assessment Cervical / Trunk Assessment: Kyphotic  Communication   Communication Communication: No apparent difficulties    Cognition Arousal: Alert Behavior During Therapy: WFL for tasks assessed/performed   PT - Cognitive impairments: Problem solving, No family/caregiver present to determine baseline, Awareness, Memory, Attention, Initiation, Sequencing, Safety/Judgement  PT - Cognition Comments: Distracted by needing to call her daughter to leave the hospital. Increased time to process and respond to questions Following commands: Impaired Following commands impaired: Follows one step commands with increased time     Cueing Cueing Techniques: Verbal cues, Gestural cues      PT Assessment Patient needs continued PT services  PT Problem List Decreased strength;Decreased activity tolerance;Decreased balance;Decreased mobility;Decreased cognition;Decreased safety awareness;Pain;Decreased knowledge of use of DME       PT Treatment Interventions DME instruction;Gait training;Functional mobility training;Therapeutic activities;Therapeutic exercise;Balance training;Patient/family education;Neuromuscular  re-education    PT Goals (Current goals can be found in the Care Plan section)  Acute Rehab PT Goals Patient Stated Goal: to go home PT Goal Formulation: With patient Time For Goal Achievement: 01/07/25 Potential to Achieve Goals: Good    Frequency Min 2X/week        AM-PAC PT 6 Clicks Mobility  Outcome Measure Help needed turning from your back to your side while in a flat bed without using bedrails?: A Little Help needed moving from lying on your back to sitting on the side of a flat bed without using bedrails?: A Lot Help needed moving to and from a bed to a chair (including a wheelchair)?: A Little Help needed standing up from a chair using your arms (e.g., wheelchair or bedside chair)?: A Little Help needed to walk in hospital room?: A Lot Help needed climbing 3-5 steps with a railing? : Total 6 Click Score: 14    End of Session Equipment Utilized During Treatment: Gait belt Activity Tolerance: Patient tolerated treatment well Patient left: in bed;with call bell/phone within reach;with bed alarm set Nurse Communication: Mobility status PT Visit Diagnosis: Pain;Unsteadiness on feet (R26.81);History of falling (Z91.81);Muscle weakness (generalized) (M62.81);Difficulty in walking, not elsewhere classified (R26.2) Pain - Right/Left: Right Pain - part of body: Hip    Time: 9172-9152 PT Time Calculation (min) (ACUTE ONLY): 20 min   Charges:   PT Evaluation $PT Eval Low Complexity: 1 Low   PT General Charges $$ ACUTE PT VISIT: 1 Visit       Kate ORN, PT, DPT Secure Chat Preferred  Rehab Office [phone]   Kate BRAVO Wendolyn 12/24/2024, 8:58 AM

## 2024-12-24 NOTE — Evaluation (Signed)
 Clinical/Bedside Swallow Evaluation Patient Details  Name: Danielle Clarke MRN: [id number] Date of Birth: [birth year]  Today's Date: 12/24/2024 Time: SLP Start Time (ACUTE ONLY): 1745 SLP Stop Time (ACUTE ONLY): 1816 SLP Time Calculation (min) (ACUTE ONLY): 31 min  Past Medical History:  Past Medical History:  Diagnosis Date   Aortic stenosis, mild    Arthritis    Asthmatic bronchitis    with colds per patient   Carotid artery disease    40-59% bilateral ICA stenosis   Cataract    Cutaneous abscess of right foot    Glaucoma    Heart murmur    Dr Claudene is her  cardiologist.    Hyperlipemia    Hypertension    Dr. 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 Wendel and Dr. Maryjane   Syncope    Type 1 diabetes mellitus [facility])    Past Surgical History:  Past Surgical History:  Procedure Laterality Date   ANKLE FRACTURE SURGERY Left 2001   steel plate and 3 screws    ANTERIOR APPROACH HEMI HIP ARTHROPLASTY Right 11/24/2024   Procedure: HEMIARTHROPLASTY, HIP, DIRECT ANTERIOR APPROACH, FOR FRACTURE;  Surgeon: [name] Franky SQUIBB, MD;  Location: [facility] OR;  Service: Orthopedics;  Laterality: Right;   ANTERIOR CERVICAL CORPECTOMY N/A 07/31/2014   Procedure: Cervical Four to Cervical Six Corpectomy;  Surgeon: Catalina CHRISTELLA Stains, MD;  Location: [facility] NEURO ORS;  Service: Neurosurgery;  Laterality: N/A;  C4 to C6 Corpectomy   BREAST SURGERY  1988   CATARACT EXTRACTION W/ INTRAOCULAR LENS  IMPLANT, BILATERAL Bilateral 2011   right and then left   EYE SURGERY     FRACTURE SURGERY     HAMMER TOE SURGERY  1998   I & D EXTREMITY Right 09/27/2018   Procedure: IRRIGATION AND DEBRIDEMENT RIGHT FOOT;  Surgeon: Harden Jerona GAILS, MD;  Location: [facility] OR;  Service: Orthopedics;  Laterality: Right;   INSERT / REPLACE / REMOVE PACEMAKER     INTRAOPERATIVE TRANSTHORACIC ECHOCARDIOGRAM  N/A 02/06/2023   Procedure: INTRAOPERATIVE TRANSTHORACIC ECHOCARDIOGRAM;  Surgeon: Wendel Lurena POUR, MD;  Location: [facility] OR;  Service: Open Heart Surgery;  Laterality: N/A;   IRRIGATION AND DEBRIDEMENT ELBOW Right 11/24/2024   Procedure: IRRIGATION AND DEBRIDEMENT ELBOW WITH WOUND CLOSURE;  Surgeon: [name] Franky SQUIBB, MD;  Location: [facility] OR;  Service: Orthopedics;  Laterality: Right;   JOINT REPLACEMENT     PACEMAKER IMPLANT N/A 02/10/2019   Procedure: PACEMAKER IMPLANT;  Surgeon: Waddell Danelle ORN, MD;  Location: [facility] INVASIVE CV LAB;  Service: Cardiovascular;  Laterality: N/A;   RIGHT/LEFT HEART CATH AND CORONARY ANGIOGRAPHY N/A 01/11/2023   Procedure: RIGHT/LEFT HEART CATH AND CORONARY ANGIOGRAPHY;  Surgeon: Wendel Lurena POUR, MD;  Location: [facility] INVASIVE CV LAB;  Service: Cardiovascular;  Laterality: N/A;   TONSILLECTOMY AND ADENOIDECTOMY  1948   TOTAL SHOULDER REPLACEMENT  2010   right shoulder    TRANSCATHETER AORTIC VALVE REPLACEMENT, TRANSFEMORAL N/A 02/06/2023   Procedure: Transcatheter Aortic Valve Replacement, Transfemoral;  Surgeon: Thukkani, Arun K, MD;  Location: [facility] OR;  Service: Open Heart Surgery;  Laterality: N/A;   TUBAL LIGATION  1979   TYMPANOSTOMY TUBE PLACEMENT Bilateral    HPI:  Danielle Clarke is an 84 y.o. female who presented to [facility] 12/23/24 from [facility] with cough. Admitted with acute respiratory failure w/ hypoxia 2/2 aspiration  PNA. Also with R paratracheal mass w/ diffuse lymphadenopathy and sclerotic lesions of T1-T2 and sternum. 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 Hx of ACDF -CT cervical spine Dec 2025 showed substantial hardware loosening and bone resorption at C7-T1, also C3. Associated nonspecific prevertebral soft tissue swelling there.   Has chronic dysphagia with recurring pna, pharyngeal dysphagia and compromised airway closure; esophageal dysphagia with probable post-prandial aspiration.  Well known to SLP services. Most recent  MBS was 04/16/24 with noted functional oral phase; chronic ACDF impacted pharyngeal function; thin/nectar thick spilled into larynx, pt able to eject/protect airway at level of arytenoids/base of epiglottis. Concerns were primarily for post-prandial aspiration  due to esophageal deficits. Hx of esophageal  dysmotility with significant clinical impact.   PMH:  ACDF 07/2014. DM1, HTN, HLD, aortic stenosis, s/p TAVR, seizures, recurrent falls, multiple bone fractures in the past, hemorrhagic stroke in 2020, progressive weakness, generalized cachexia, under hospice care at home.    Assessment / Plan / Recommendation  Clinical Impression  PLAN: NPO except 1/2 tspns of water  for pleasure (pt dislikes ice).  MBS next date.   Pt presents with concerns for deteriorating swallow function since she was last seen by SLP in this setting. She presents with severe s/s of aspiration with water  and applesauce.  Limited POs led to consistent, explosive and congested coughing.    She has multiple comorbidities that make her susceptible to ongoing aspiration pna: limited mobility after recent hip fx, respiratory compromise, frailty, documented history of compromised airway protection during the swallow and likely post-prandial aspiration; esophageal dysmotility.    I spoke with her daughter, [name], over the phone and discussed plan for MBS next date, keeping pt NPO in the interim. She verbalized understanding of recs. D/W RN. SLP will follow.   SLP Visit Diagnosis: Dysphagia, oropharyngeal phase (R13.12);Dysphagia, pharyngoesophageal phase (R13.14)    Aspiration Risk  Severe aspiration risk;Risk for inadequate nutrition/hydration    Diet Recommendation   NPO       Other Recommendations Oral Care Recommendations: Oral care QID     Swallow Evaluation Recommendations     Assistance Recommended at Discharge    Functional Status Assessment Patient has had a recent decline in their functional status and demonstrates  the ability to make significant improvements in function in a reasonable and predictable amount of time.  Frequency and Duration            Prognosis        Swallow Study   General Date of Onset: 12/23/24 HPI: Alivia Cimino is an 84 y.o. female who presented to [facility] 12/23/24 from [facility] with cough. Admitted with acute respiratory failure w/ hypoxia 2/2 aspiration PNA. Also with R paratracheal mass w/ diffuse lymphadenopathy and sclerotic lesions of T1-T2 and sternum. 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 Hx of ACDF -CT cervical spine Dec 2025 showed substantial hardware loosening and bone resorption at C7-T1, also C3. Associated nonspecific prevertebral soft tissue swelling there. Has chronic dysphagia with recurring pna, pharyngeal dysphagia and compromised airway closure; esophageal dysphagia with probable post-prandial aspiration.  Well known to SLP services. Most recent MBS was 04/16/24 with noted functional oral phase; chronic ACDF impacted pharyngeal function; thin/nectar thick spilled into larynx, pt able to eject/protect airway at level of arytenoids/base of epiglottis. Concerns were primarily for post-prandial aspiration  due to esophageal deficits. Hx of esophageal  dysmotility with significant clinical impact. PMH:  ACDF 07/2014. DM1, HTN, HLD, aortic  stenosis, s/p TAVR, seizures, recurrent falls, multiple bone fractures in the past, hemorrhagic stroke in 2020, progressive weakness, generalized cachexia, under hospice care at home. Type of Study: Bedside Swallow Evaluation Previous Swallow Assessment: see hpi Diet Prior to this Study: NPO Temperature Spikes Noted: No Respiratory Status: Room air History of Recent Intubation: No Behavior/Cognition: Alert Oral Cavity Assessment: Within Functional Limits Oral Care Completed by SLP: Yes Oral Cavity - Dentition: Adequate natural dentition Vision: Functional for self-feeding Self-Feeding Abilities:  Able to feed self Patient Positioning: Upright in bed Baseline Vocal Quality: Low vocal intensity Volitional Cough: Weak;Wet;Congested Volitional Swallow: Able to elicit    Oral/Motor/Sensory Function Overall Oral Motor/Sensory Function: Within functional limits   Ice Chips Ice chips:  (pt declined)   Thin Liquid Thin Liquid: Impaired Presentation: Straw;Cup Pharyngeal  Phase Impairments: Wet Vocal Quality;Cough - Immediate    Nectar Thick Nectar Thick Liquid: Not tested   Honey Thick Honey Thick Liquid: Not tested   Puree Puree: Impaired Presentation: Self Fed Pharyngeal Phase Impairments: Cough - Immediate   Solid     Solid: Not tested      Vona Palma Laurice 12/24/2024,6:38 PM  Palma L. Vona, MA CCC/SLP Clinical Specialist - Acute Care SLP Acute Rehabilitation Services Office number [phone]

## 2024-12-25 ENCOUNTER — Inpatient Hospital Stay (HOSPITAL_COMMUNITY)

## 2024-12-25 DIAGNOSIS — J690 Pneumonitis due to inhalation of food and vomit: Secondary | ICD-10-CM | POA: Diagnosis not present

## 2024-12-25 DIAGNOSIS — N1832 Chronic kidney disease, stage 3b: Secondary | ICD-10-CM | POA: Diagnosis not present

## 2024-12-25 DIAGNOSIS — I10 Essential (primary) hypertension: Secondary | ICD-10-CM | POA: Diagnosis not present

## 2024-12-25 LAB — GLUCOSE, CAPILLARY
Glucose-Capillary: 89 mg/dL (ref 70–99)
Glucose-Capillary: 105 mg/dL — ABNORMAL HIGH (ref 70–99)
Glucose-Capillary: 115 mg/dL — ABNORMAL HIGH (ref 70–99)
Glucose-Capillary: 128 mg/dL — ABNORMAL HIGH (ref 70–99)

## 2024-12-25 MED ORDER — ASPIRIN 81 MG PO TBEC
81.0000 mg | DELAYED_RELEASE_TABLET | Freq: Every day | ORAL | Status: DC
  Administered 2024-12-26 – 2024-12-27 (×2): 81 mg via ORAL
  Filled 2024-12-25 (×3): qty 1

## 2024-12-25 MED ORDER — POLYVINYL ALCOHOL 1.4 % OP SOLN: 1.0000 [drp] | Freq: Four times a day (QID) | OPHTHALMIC | Status: DC | PRN

## 2024-12-25 MED ORDER — ONDANSETRON 4 MG PO TBDP
4.0000 mg | ORAL_TABLET | Freq: Four times a day (QID) | ORAL | Status: DC | PRN
  Administered 2024-12-27 – 2024-12-28 (×2): 4 mg via ORAL
  Filled 2024-12-25 (×3): qty 1

## 2024-12-25 MED ORDER — MORPHINE SULFATE (CONCENTRATE) 10 MG /0.5 ML PO SOLN: 5.0000 mg | ORAL | Status: DC | PRN

## 2024-12-25 MED ORDER — HALOPERIDOL 0.5 MG PO TABS: 0.5000 mg | ORAL_TABLET | ORAL | Status: DC | PRN

## 2024-12-25 MED ORDER — GLYCOPYRROLATE 0.2 MG/ML IJ SOLN: 0.2000 mg | INTRAMUSCULAR | Status: DC | PRN

## 2024-12-25 MED ORDER — ONDANSETRON HCL 4 MG/2ML IJ SOLN: 4.0000 mg | Freq: Four times a day (QID) | INTRAMUSCULAR | Status: DC | PRN

## 2024-12-25 MED ORDER — STROKE: EARLY STAGES OF RECOVERY BOOK
Freq: Once | Status: AC
  Filled 2024-12-25: qty 1

## 2024-12-25 MED ORDER — HALOPERIDOL LACTATE 5 MG/ML IJ SOLN: 0.5000 mg | INTRAMUSCULAR | Status: DC | PRN

## 2024-12-25 MED ORDER — PANTOPRAZOLE SODIUM 40 MG PO TBEC
40.0000 mg | DELAYED_RELEASE_TABLET | Freq: Two times a day (BID) | ORAL | Status: DC
  Administered 2024-12-25 – 2024-12-27 (×3): 40 mg via ORAL
  Filled 2024-12-25 (×6): qty 1

## 2024-12-25 MED ORDER — BIOTENE DRY MOUTH MT LIQD: 15.0000 mL | OROMUCOSAL | Status: DC | PRN

## 2024-12-25 MED ORDER — SODIUM CHLORIDE 0.9 % IV SOLN: INTRAVENOUS | Status: DC

## 2024-12-25 MED ORDER — HALOPERIDOL LACTATE 2 MG/ML PO CONC: 0.5000 mg | ORAL | Status: DC | PRN

## 2024-12-25 MED ORDER — METOPROLOL TARTRATE 12.5 MG HALF TABLET
12.5000 mg | ORAL_TABLET | Freq: Two times a day (BID) | ORAL | Status: DC
  Administered 2024-12-25 – 2024-12-27 (×5): 12.5 mg via ORAL
  Filled 2024-12-25 (×6): qty 1

## 2024-12-25 MED ORDER — GLYCOPYRROLATE 1 MG PO TABS: 1.0000 mg | ORAL_TABLET | ORAL | Status: DC | PRN

## 2024-12-25 MED ORDER — CLOPIDOGREL BISULFATE 75 MG PO TABS
75.0000 mg | ORAL_TABLET | Freq: Every day | ORAL | Status: DC
  Administered 2024-12-25 – 2024-12-27 (×3): 75 mg via ORAL
  Filled 2024-12-25 (×3): qty 1

## 2024-12-25 NOTE — TOC Progression Note (Addendum)
 Transition of Care [facility]) - Progression Note    Patient Details  Name: [name] MRN: [id number] Date of Birth: 11/19/1941  Transition of Care [facility]) CM/SW Contact  Isaiah Public, LCSWA Phone Number: 12/25/2024, 10:29 AM  Clinical Narrative:     CMA Angela informed CSW she resubmitted patients insurance auth. CMA informed CSW that when she initially initiated the auth Patients insurance thought patient was still at [facility]. CMA Jon informed CSW that insurance authorization has been restarted and  Auth ID# [id number] .   Update- Jon CMA informed CSW that patients insurance authorization has been approved. Insurance authorization has been approved from 1/29-2/2 NRD 2/2 Plan [name] PI#[id number] CSW informed MD.MD informed CSW that patient not medically ready today. CSW informed Star with [facility].  Expected Discharge Plan: Skilled Nursing Facility Barriers to Discharge: Continued Medical Work up               Expected Discharge Plan and Services In-house Referral: Clinical Social Work   Post Acute Care Choice: Skilled Nursing Facility Living arrangements for the past 2 months:  (came from short term rehab at [facility].)                                       Social Drivers of Health (SDOH) Interventions SDOH Screenings   Food Insecurity: No Food Insecurity (12/23/2024)  Housing: Low Risk (12/23/2024)  Transportation Needs: No Transportation Needs (12/23/2024)  Utilities: Patient Unable To Answer (12/23/2024)  Social Connections: Unknown (12/23/2024)  Tobacco Use: Low Risk (12/23/2024)    Readmission Risk Interventions    12/10/2024   12:01 PM 12/09/2024    3:42 PM 04/23/2024   11:16 AM  Readmission Risk Prevention Plan  Transportation Screening Complete  Complete  PCP or Specialist Appt within 5-7 Days Complete Complete Complete  Home Care Screening Complete Complete Complete  Medication Review (RN CM) Complete Complete Complete

## 2024-12-25 NOTE — Procedures (Signed)
 Modified Barium Swallow Study  Patient Details  Name: Danielle Clarke MRN: [id number] Date of Birth: 1940-12-27  Today's Date: 12/25/2024  Modified Barium Swallow completed.  Full report located under Chart Review in the Imaging Section.  History of Present Illness Danielle Clarke is an 84 y.o. female who presented to [facility] 12/23/24 from [facility] with cough. Admitted with acute respiratory failure w/ hypoxia 2/2 aspiration PNA. Also with R paratracheal mass w/ diffuse lymphadenopathy and sclerotic lesions of T1-T2 and sternum. 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 Hx of ACDF -CT cervical spine Dec 2025 showed substantial hardware loosening and bone resorption at C7-T1, also C3. Associated nonspecific prevertebral soft tissue swelling there. Has chronic dysphagia with recurring pna, pharyngeal dysphagia and compromised airway closure; esophageal dysphagia with probable post-prandial aspiration.  Well known to SLP services. Most recent MBS was 04/16/24 with noted functional oral phase; chronic ACDF impacted pharyngeal function; thin/nectar thick spilled into larynx, pt able to eject/protect airway at level of arytenoids/base of epiglottis. Concerns were primarily for post-prandial aspiration  due to esophageal deficits. Hx of esophageal  dysmotility with significant clinical impact. PMH:  ACDF 07/2014. DM1, HTN, HLD, aortic stenosis, s/p TAVR, seizures, recurrent falls, multiple bone fractures in the past, hemorrhagic stroke in 2020, progressive weakness, generalized cachexia, under hospice care at home.   Clinical Impression Rec: SLP to discuss MBS with patient and family. PO recommendations to be based on patient's GOC and discussions with patient, family, medical care team.  Patient has had a decline in her swallow function since MBS in May 2025 and has had documented prevertebral soft tissue swelling at C3 and hardware loosening at C7-11 and C3. She is  exhibiting overt s/s of aspiration, limited mobility following recent hip fracture, respiratory compromise, fraility, documented h/o compromised airway protection during the swallow, esophageal dysmotility.   Patient presents with a moderate pharyngoesophageal dysphagia with impact from prevertebral soft tissue swelling at C3 (reported in 11/23/24 CT Cervical spine) which prevented full epiglottic inversion and significantly reduced barium flow through past it. Almost immediate retrograde flow of barium back up through PES and into pharynx occured with all consistencies. This also led to silent aspiration after the swallow from residuals spilling over arytenoids and into airway with nectar and thin liquids. Penetration that remained above the vocal cords and exited laryngeal vestibule visualized with honey thick liquids but no aspiration visualized during this study. Prior to the study, patient with weak, congested sounding cough that was not productive. During the study, she exhibited this same congested sounding cough but did have instances of expectorating some barium. Patient was in noticeable discomfort by the time we reached the puree solid bolus of the MBSimp.       DIGEST Swallow Severity Rating*  Safety: 2  Efficiency:1  Overall Pharyngeal Swallow Severity: 2 1: mild; 2: moderate; 3: severe; 4: profound  *The Dynamic Imaging Grade of Swallowing Toxicity is standardized for the head and neck cancer population, however, demonstrates promising clinical applications across populations to standardize the clinical rating of pharyngeal swallow safety and severity.  Factors that may increase risk of adverse event in presence of aspiration Noe & Lianne 2021): Frail or deconditioned;Weak cough;Poor general health and/or compromised immunity;Limited mobility  Swallow Evaluation Recommendations Recommendations: NPO Medication Administration: Via alternative means Oral care recommendations: Oral  care QID (4x/day)    [name] IVAR Blase, MA, CCC-SLP Speech Therapy  12/25/2024,11:42 AM

## 2024-12-25 NOTE — Plan of Care (Signed)
  Problem: Pain Managment: Goal: General experience of comfort will improve and/or be controlled Outcome: Progressing   Problem: Safety: Goal: Ability to remain free from injury will improve Outcome: Progressing

## 2024-12-25 NOTE — Evaluation (Signed)
 Occupational Therapy Evaluation Patient Details Name: Danielle Clarke MRN: [id number] DOB: 1941-03-31 Today's Date: 12/25/2024   History of Present Illness   84 y.o. female presents to [facility] 12/23/24 from [facility] with cough. Admitted with acute respiratory failure w/ hypoxia 2/2 aspiration PNA. Also with R paratracheal mass w/ diffuse lymphadenopathy and sclerotic lesions of T1-T2 and sternum. 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 PMH:  DM1, HTN, HLD, aortic stenosis, s/p TAVR, seizures, recurrent falls, multiple bone fractures in the past, hemorrhagic stroke in 2020, progressive weakness, generalized cachexia, under hospice care at home.     Clinical Impressions Pt c/o significant fatigue. Pt from short term SNF stay at [facility], plans to return. PLOF has been working with therapy using RW at SNF, needs help for ADLs, has been having progressive weakness and fatigue. Pt currently too tired to get to EOB or perform OOB activities. Pt able to roll L/R with min A using bed rails to place pad underneath and help reposition Pt in bed. Pt able to perform grooming min A at bed level, increased time due to weakness. Pt would benefit from continued acute OT to maximize functional strength and activity tolerance, recommending postacute rehab <3hrs/day.      If plan is discharge home, recommend the following:   A lot of help with walking and/or transfers;A lot of help with bathing/dressing/bathroom;Assistance with cooking/housework;Assistance with feeding;Assist for transportation;Help with stairs or ramp for entrance     Functional Status Assessment   Patient has had a recent decline in their functional status and demonstrates the ability to make significant improvements in function in a reasonable and predictable amount of time.     Equipment Recommendations   Other (comment) (defer)     Recommendations for Other Services          Precautions/Restrictions   Precautions Precautions: Fall Recall of Precautions/Restrictions: Impaired Restrictions Weight Bearing Restrictions Per Provider Order: No     Mobility Bed Mobility Overal bed mobility: Needs Assistance Bed Mobility: Rolling Rolling: Min assist, Used rails         General bed mobility comments: min A using rails for rolling L/R    Transfers Overall transfer level: Needs assistance                 General transfer comment: declined      Balance Overall balance assessment: Needs assistance                                         ADL either performed or assessed with clinical judgement   ADL Overall ADL's : Needs assistance/impaired Eating/Feeding: NPO   Grooming: Minimal assistance;Bed level                                 General ADL Comments: bed level eval, Pt too tired to sit EOB, able to roll L/R min A     Vision         Perception         Praxis         Pertinent Vitals/Pain Pain Assessment Pain Assessment: No/denies pain     Extremity/Trunk Assessment Upper Extremity Assessment Upper Extremity Assessment: Generalized weakness   Lower Extremity Assessment Lower Extremity Assessment: Defer to PT evaluation       Communication  Communication Communication: Impaired Factors Affecting Communication: Hearing impaired   Cognition Arousal: Alert Behavior During Therapy: WFL for tasks assessed/performed Cognition: No apparent impairments                               Following commands: Impaired Following commands impaired: Follows one step commands with increased time     Cueing  General Comments          Exercises     Shoulder Instructions      Home Living Family/patient expects to be discharged to:: Private residence Living Arrangements: Alone Available Help at Discharge: Family;Personal care attendant Type of Home: House Home Access: Stairs  to enter [employer] of Steps: 2 Entrance Stairs-Rails: Right Home Layout: One level     Bathroom Shower/Tub: Chief Strategy Officer: Handicapped height     Home Equipment: BSC/3in1;Cane - single point;Shower seat;Rolling Environmental Consultant (2 wheels)   Additional Comments: daughter lives next door, is a flight attendant so at home intermittently. Pt takes care of her cat. Daughter in law assists sometimes, PCA assists 4-5X as needed (info from prior admission)      Prior Functioning/Environment Prior Level of Function : Needs assist             Mobility Comments: Has had assist for all mobility at SNF. Ambulating with RW ADLs Comments: At SNF, has been having assist for ADLs. Prior to fall, pt had assist for showers    OT Problem List: Decreased strength;Decreased activity tolerance;Impaired balance (sitting and/or standing)   OT Treatment/Interventions: Self-care/ADL training;Therapeutic exercise;Energy conservation;DME and/or AE instruction;Therapeutic activities;Balance training;Patient/family education      OT Goals(Current goals can be found in the care plan section)   Acute Rehab OT Goals Patient Stated Goal: to return home OT Goal Formulation: With patient Time For Goal Achievement: 01/08/25 Potential to Achieve Goals: Good   OT Frequency:  Min 2X/week    Co-evaluation              AM-PAC OT 6 Clicks Daily Activity     Outcome Measure Help from another person eating meals?: Total Help from another person taking care of personal grooming?: A Little Help from another person toileting, which includes using toliet, bedpan, or urinal?: A Lot Help from another person bathing (including washing, rinsing, drying)?: A Lot Help from another person to put on and taking off regular upper body clothing?: A Lot Help from another person to put on and taking off regular lower body clothing?: A Lot 6 Click Score: 12   End of Session Nurse  Communication: Mobility status  Activity Tolerance: Patient limited by fatigue Patient left: in bed;with call bell/phone within reach;with family/visitor present;with nursing/sitter in room  OT Visit Diagnosis: Unsteadiness on feet (R26.81);Muscle weakness (generalized) (M62.81);History of falling (Z91.81);Other abnormalities of gait and mobility (R26.89)                Time: 8783-8763 OT Time Calculation (min): 20 min Charges:  OT General Charges $OT Visit: 1 Visit OT Evaluation $OT Eval Moderate Complexity: 1 [address], OTR/L   Elouise JONELLE Bott 12/25/2024, 12:55 PM

## 2024-12-25 NOTE — Progress Notes (Addendum)
 Dr.Feliz Celinda notified that pt.refuses to have IV placed, unable to take any PO medications and pt.unable/unwilling to participate in neuro/assessments.  Pt. Keeps saying   I don't want any of this'' MD response is ''she needs IV'' RN again informing provider that pt.is refusing.  Daughter in room and is unable to convince pt.to allow IV placement.  MD aware of pt.having no IV at this time.

## 2024-12-25 NOTE — Progress Notes (Signed)
 [facility] PROGRESS NOTE    Progress Note  Danielle Clarke  FMW:[id number] DOB: June 07, 1941 DOA: 12/23/2024 PCP: [name], MD     Brief Narrative:   Danielle Clarke is an 84 y.o. female past medical history of aortic stenosis status post TAVR's, history of asthma, diabetes mellitus type 1, chronic kidney disease stage III A, essential hypertension history of left humeral fracture T12 compression fracture, prolonged QTc and history of intracranial bleed with a history of traumatic brain injury, seizure disorder discharged from the hospital for acute bronchitis, and recurrent pneumonias sent from nursing home for cough.  CT angio of the chest was done negative for PE but showed new airspace disease in the right upper lobe and a progressive foci of consolidation in the lower lung with prominent debris in the bronchi.  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  Multiple lymphadenopathy and paratracheal lymphadenopathy and sclerotic lesions at T1-T2 and sternum  Assessment/Plan:   Acute respiratory failure with hypoxia secondary to aspiration pneumonia: She has episode of recurrent pneumonias. N.p.o., for MBBS today. Continue on IV cefepime . Patient is being followed by hospice at home, they agreed to meet with palliative care to try to move toward comfort care.  Right paratracheal mass with diffuse lymphadenopathy and sclerotic lesions of T1-T2 and sternum: On previous admission it was discussed with the daughter they would like not to pursue a biopsy In the past and has been referred to pulmonary as an outpatient. 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 MRI of the brain showed no mass.  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 HgbA1c 9.9, fasting lipid panel  PT, OT,  awaiting evaluation speech consult: For MBBS  today Will defer CT angio of the head and neck as family would like to avoid any intervention. Transthoracic Echo, EF of 55%.  No evidence of A-fib. Start patient on ASA 81mg  daily and plavix  75mg  daily, discussed with family they agreed BP goal: permissive HTN upto 220/120 mmHg No evidence of A-fib on telemetry.  History of seizure disorder: Continue Keppra .  Diabetes mellitus type 1 (HCC): A1c of 9 continue long-acting insulin  per sliding scale.  Essential hypertension: Continue metoprolol .  History of GI bleed: Continue IV PPI.  History of heart block status post pacemaker in 2020: Noted.  History of intracranial bleed: Fall precaution.  Right hip fracture status post repair: PT OT consulted.  Goals of care: Hospice follows her at home.   DVT prophylaxis: lovenox  Family Communication:daughter Status is: Inpatient Remains inpatient appropriate because: asp. PNA    Code Status:     Code Status Orders  (From admission, onward)           Start     Ordered   12/23/24 1827  Do not attempt resuscitation (DNR)- Limited -Do Not Intubate (DNI)  (Code Status)  Continuous       Question Answer Comment  If pulseless and not breathing No CPR or chest compressions.   In Pre-Arrest Conditions (Patient Is Breathing and Has A Pulse) Do not intubate. Provide all appropriate non-invasive medical interventions. Avoid ICU transfer unless indicated or required.   Consent: Discussion documented in EHR or advanced directives reviewed      12/23/24 1827           Code Status History     Date Active Date Inactive Code  Status Order ID Comments User Context   12/23/2024 1805 12/23/2024 1827 Full Code [id number]  Tobie Mario GAILS, MD ED   12/08/2024 1740 12/14/2024 1838 Limited: Do not attempt resuscitation (DNR) -DNR-LIMITED -Do Not Intubate/DNI  [id number]  Celinda Alm Lot, MD ED   11/28/2024 [phone] 11/28/2024 1836 Limited: Do not attempt resuscitation (DNR) -DNR-LIMITED -Do Not  Intubate/DNI  [id number]  Davia Nydia POUR, MD Inpatient   11/23/2024 1026 11/28/2024 0805 Full Code [id number]  Willette Adriana LABOR, MD ED   04/19/2024 1452 04/25/2024 1920 Limited: Do not attempt resuscitation (DNR) -DNR-LIMITED -Do Not Intubate/DNI  [id number]  Rodman Recardo NOVAK, NP Inpatient   04/15/2024 1945 04/19/2024 1452 Full Code [id number]  Shona Terry SAILOR, DO ED   04/01/2024 1904 04/04/2024 2307 Full Code [id number]  Moody Alto, MD ED   02/06/2023 1040 02/07/2023 1928 Full Code [id number]  Arvil Kate BIRCH, NP Inpatient   01/11/2023 0923 01/11/2023 1624 Full Code [id number]  Wendel Lurena POUR, MD Inpatient   11/19/2021 1520 11/25/2021 1616 Full Code [id number]  Waddell Rake, MD ED   10/14/2020 1755 10/16/2020 1818 Full Code [id number]  Odell Celinda Balo, MD Inpatient   02/14/2019 1449 02/28/2019 1524 Full Code [id number]  Pegge Toribio PARAS, PA-C Inpatient   02/14/2019 1449 02/14/2019 1449 Full Code [id number]  Pegge Toribio PARAS, PA-C Inpatient   02/07/2019 0036 02/14/2019 1440 Full Code [id number]  Franky Redia SAILOR, MD ED   08/05/2017 1448 08/08/2017 1814 Full Code [id number]  Will Almarie MATSU, MD ED   01/04/2017 1819 01/07/2017 1438 Full Code [id number]  Kara Reaper, MD ED   07/31/2014 1654 08/05/2014 2030 Full Code [id number]  Leeann Catalina HERO, MD Inpatient   12/29/2013 2134 01/01/2014 1811 Full Code [id number]  Lonzell Emeline HERO, DO ED   01/02/2013 1835 01/03/2013 2131 Full Code [id number]  Canda Damien BRAVO, RN Inpatient      Advance Directive Documentation    Flowsheet Row Most Recent Value  Type of Advance Directive Healthcare Power of Attorney  Pre-existing out of facility DNR order (yellow form or pink MOST form) --  MOST Form in Place? --      IV Access:   Peripheral IV   Procedures and diagnostic studies:   MR BRAIN WO CONTRAST Result Date: 12/24/2024 EXAM: MRI BRAIN WITHOUT CONTRAST 12/24/2024 05:05:49 PM TECHNIQUE: Multiplanar multisequence MRI of the head/brain was performed without the  administration of intravenous contrast. COMPARISON: MR head without contrast 04/16/2024. CT head without contrast 11/23/2024. CLINICAL HISTORY: h/o h'gic cva/ now aspirating not sure if new cva. History of hemorrhagic cerebrovascular accident. Now aspirating, not sure if new cerebrovascular accident. FINDINGS: BRAIN AND VENTRICLES: A 6 mm focus of restricted diffusion is present in the high posterior right occipital lobe. No other acute infarct is present. Chronic encephalomalacia of the anterior left frontal lobe is stable. Chronic encephalomalacia is present in the anterior temporal tips bilaterally. Remote blood products are present within these areas of encephalomalacia consistent with prior trauma. No intracranial hemorrhage. No mass. No midline shift. No hydrocephalus. The sella is unremarkable. Normal flow voids. ORBITS: Bilateral lens replacements are noted. The globes and orbits are otherwise within normal limits. SINUSES AND MASTOIDS: A fluid level is present in the left maxillary sinus. A small left mild inferior mastoid effusions are present bilaterally. Anterior right ethmoid air cells and the right frontal sinus are opacified, stable. BONES AND SOFT TISSUES: Normal marrow signal. No soft tissue abnormality. IMPRESSION: 1. Acute 6 mm infarct in the high posterior right occipital lobe. 2.  Stable chronic encephalomalacia in the anterior left frontal lobe and anterior temporal tips bilaterally with remote blood products, compatible with prior trauma. Electronically signed by: Lonni Necessary MD 12/24/2024 05:56 PM EST RP Workstation: [id number]   CT Angio Chest PE W and/or Wo Contrast Result Date: 12/23/2024 EXAM: CTA of the Chest with contrast for PE 12/23/2024 03:39:00 PM TECHNIQUE: CTA of the chest was performed after the administration of intravenous contrast. Multiplanar reformatted images are provided for review. MIP images are provided for review. Automated exposure control, iterative  reconstruction, and/or weight based adjustment of the mA/kV was utilized to reduce the radiation dose to as low as reasonably achievable. COMPARISON: Chest radiograph 12/23/2024 and CT chest 12/08/2024. CLINICAL HISTORY: Pulmonary embolism (PE) suspected, low to intermediate prob, neg D-dimer. Pulmonary embolism suspected, low to intermediate probability, negative D-dimer. FINDINGS: PULMONARY ARTERIES: Moderate motion artifact. No large central pulmonary emboli are demonstrated. Evaluation of peripheral vessels is limited due to motion artifact, but no definitive pulmonary embolus is seen. Main pulmonary artery is normal in caliber. MEDIASTINUM: Cardiac enlargement. Calcification of the mitral valve annulus. Calcification of coronary arteries and aorta. Aortic valve prosthesis. Cardiac pacemaker. Superior vena cava appears patent. 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 There is no acute abnormality of the thoracic aorta. LYMPH NODES: Mediastinal lymphadenopathy with pretracheal lymph nodes measuring up to 1.5 cm diameter, similar to prior study and possibly metastatic. No hilar or axillary lymphadenopathy. LUNGS AND PLEURA: Prominent fluid and debris within the mainstem bronchi and lower lobe bronchi bilaterally, new since the prior study, likely indicating aspiration. There is new airspace disease in the right upper lobe and progressing foci of consolidation in the lower lungs. Fibrous pulmonary nodules are similar to the prior study. No pleural effusion or pneumothorax. UPPER ABDOMEN: No acute abnormalities demonstrated in the visualized upper abdomen. SOFT TISSUES AND BONES: Multiple collateral vessels demonstrated in the soft tissues of the right chest. Degenerative changes in the spine. Sclerosis of T1 and T2 possibly metastatic. Heterogeneous lucencies in the sternum and some vertebrae may also be metastatic. Anterior  fixation of the visualized cervical spine. No acute soft tissue abnormality. IMPRESSION: 1. No large central pulmonary emboli are demonstrated. Evaluation of the peripheral vessels is limited by motion artifact, but no definitive pulmonary embolus is seen. 2. New airspace disease in the right upper lobe and progressing foci of consolidation in the lower lungs, with prominent fluid and debris within the bronchi, likely indicating aspiration. 3. 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 4. Mediastinal lymphadenopathy with pretracheal lymph nodes measuring up to 1.5 cm diameter, similar to prior study and possibly metastatic. 5. Sclerosis of T1 and T2 and heterogeneous lucencies in the sternum and visualized vertebrae, possibly metastatic. Electronically signed by: Elsie Gravely MD 12/23/2024 03:53 PM EST RP Workstation: [id number]   DG Chest Port 1 View Result Date: 12/23/2024 CLINICAL DATA:  Shortness of breath.  Weakness. EXAM: PORTABLE CHEST - 1 VIEW COMPARISON:  12/08/2024 FINDINGS: Cardiomediastinal silhouette and pulmonary vasculature are within normal limits. Lungs are clear. Postsurgical changes of RIGHT shoulder replacement, pacemaker, ACDF, and prosthetic aortic valve are again seen. IMPRESSION: No acute cardiopulmonary process. Electronically Signed   By: Aliene Lloyd M.D.   On: 12/23/2024 14:16     Medical Consultants:   None.   Subjective:    MEHA VIDRINE no complaints today.  Objective:  Vitals:   12/24/24 2335 12/25/24 0400 12/25/24 0448 12/25/24 0724  BP: (!) 115/41 90/69  (!) 132/46  Pulse: 92   99  Resp: (!) 22 (!) 23  18  Temp: 98.1 F (36.7 C) 98.4 F (36.9 C)  99.3 F (37.4 C)  TempSrc: Axillary Axillary  Oral  SpO2:    95%  Weight:   40.4 kg   Height:       SpO2: 95 %   Intake/Output Summary (Last 24 hours) at 12/25/2024 0751 Last data filed at 12/24/2024  1800 Gross per 24 hour  Intake 955.31 ml  Output --  Net 955.31 ml   Filed Weights   12/23/24 1952 12/24/24 0411 12/25/24 0448  Weight: 37.8 kg 37.5 kg 40.4 kg    Exam: General exam: In no acute distress. Respiratory system: Good air movement and clear to auscultation. Cardiovascular system: S1 & S2 heard, RRR. No JVD. Gastrointestinal system: Abdomen is nondistended, soft and nontender.  Extremities: No pedal edema. Skin: No rashes, lesions or ulcers Psychiatry: Judgement and insight appear normal. Mood & affect appropriate. Data Reviewed:    Labs: Basic Metabolic Panel: Recent Labs  Lab 12/23/24 1253 12/23/24 1309 12/23/24 1310 12/23/24 1458  NA 133* 133* 133*  --   K 5.0 4.7 4.7  --   CL 97* 100  --   --   CO2 25  --   --   --   GLUCOSE 200* 204*  --   --   BUN 28* 33*  --   --   CREATININE 0.58 0.60  --   --   CALCIUM 8.8*  --   --   --   MG  --   --   --  2.0   GFR Estimated Creatinine Clearance: 34.6 mL/min (by C-G formula based on SCr of 0.6 mg/dL). Liver Function Tests: Recent Labs  Lab 12/23/24 1253 12/23/24 1458  AST 29 27  ALT 18 21  ALKPHOS 144* 144*  BILITOT 0.3 0.3  PROT 7.3 7.2  ALBUMIN  3.0* 3.0*   No results for input(s): LIPASE, AMYLASE in the last 168 hours. No results for input(s): AMMONIA in the last 168 hours. Coagulation profile Recent Labs  Lab 12/23/24 1253  INR 1.1   COVID-19 Labs  No results for input(s): DDIMER, FERRITIN, LDH, CRP in the last 72 hours.  Lab Results  Component Value Date   SARSCOV2NAA NEGATIVE 12/23/2024   SARSCOV2NAA NEGATIVE 12/08/2024   SARSCOV2NAA NEGATIVE 04/15/2024   SARSCOV2NAA NEGATIVE 04/01/2024    CBC: Recent Labs  Lab 12/23/24 1253 12/23/24 1309 12/23/24 1310  WBC 15.1*  --   --   NEUTROABS 12.9*  --   --   HGB 9.5* 10.5* 10.5*  HCT 29.9* 31.0* 31.0*  MCV 82.8  --   --   PLT 355  --   --    Cardiac Enzymes: Recent Labs  Lab 12/23/24 1458  CKTOTAL 20*   BNP  (last 3 results) Recent Labs    12/08/24 1218 12/23/24 1253  PROBNP 1,201.0* 1,849.0*   CBG: Recent Labs  Lab 12/24/24 1724 12/24/24 2025 12/24/24 2334 12/25/24 0414 12/25/24 0722  GLUCAP 77 85 95 89 105*   D-Dimer: No results for input(s): DDIMER in the last 72 hours. Hgb A1c: No results for input(s): HGBA1C in the last 72 hours. Lipid Profile: No results for input(s): CHOL, HDL, LDLCALC, TRIG, CHOLHDL, LDLDIRECT in the last 72 hours. Thyroid  function studies: No results for input(s): TSH, T4TOTAL, T3FREE,  THYROIDAB in the last 72 hours.  Invalid input(s): FREET3 Anemia work up: No results for input(s): VITAMINB12, FOLATE, FERRITIN, TIBC, IRON, RETICCTPCT in the last 72 hours. Sepsis Labs: Recent Labs  Lab 12/23/24 1253 12/23/24 1309  WBC 15.1*  --   LATICACIDVEN  --  1.3   Microbiology Recent Results (from the past 240 hours)  Culture, blood (routine x 2)     Status: None (Preliminary result)   Collection Time: 12/23/24 12:54 PM   Specimen: BLOOD RIGHT ARM  Result Value Ref Range Status   Specimen Description BLOOD RIGHT ARM  Final   Special Requests   Final    NONE Performed at [facility], [address], [place], [place] [address]    Culture PENDING  Incomplete   Report Status PENDING  Incomplete  Culture, blood (routine x 2)     Status: None (Preliminary result)   Collection Time: 12/23/24 12:54 PM   Specimen: BLOOD RIGHT ARM  Result Value Ref Range Status   Specimen Description BLOOD RIGHT ARM  Final   Special Requests   Final    BOTTLES DRAWN AEROBIC AND ANAEROBIC Blood Culture adequate volume   Culture   Final    NO GROWTH < 24 HOURS Performed at [facility], [address] [place], [place] [address]    Report Status PENDING  Incomplete  Resp panel by RT-PCR (RSV, Flu A&B, Covid)     Status: None   Collection Time: 12/23/24 12:54 PM   Specimen: Nasal Swab  Result Value Ref Range Status    SARS Coronavirus 2 by RT PCR NEGATIVE NEGATIVE Final   Influenza A by PCR NEGATIVE NEGATIVE Final   Influenza B by PCR NEGATIVE NEGATIVE Final    Comment: (NOTE) The Xpert Xpress SARS-CoV-2/FLU/RSV plus assay is intended as an aid in the diagnosis of influenza from Nasopharyngeal swab specimens and should not be used as a sole basis for treatment. Nasal washings and aspirates are unacceptable for Xpert Xpress SARS-CoV-2/FLU/RSV testing.  Fact Sheet for Patients: bloggercourse.com  Fact Sheet for Healthcare Providers: seriousbroker.it  This test is not yet approved or cleared by the [place]  FDA and has been authorized for detection and/or diagnosis of SARS-CoV-2 by FDA under an Emergency Use Authorization (EUA). This EUA will remain in effect (meaning this test can be used) for the duration of the COVID-19 declaration under Section 564(b)(1) of the Act, 21 U.S.C. section 360bbb-3(b)(1), unless the authorization is terminated or revoked.     Resp Syncytial Virus by PCR NEGATIVE NEGATIVE Final    Comment: (NOTE) Fact Sheet for Patients: bloggercourse.com  Fact Sheet for Healthcare Providers: seriousbroker.it  This test is not yet approved or cleared by the [place]  FDA and has been authorized for detection and/or diagnosis of SARS-CoV-2 by FDA under an Emergency Use Authorization (EUA). This EUA will remain in effect (meaning this test can be used) for the duration of the COVID-19 declaration under Section 564(b)(1) of the Act, 21 U.S.C. section 360bbb-3(b)(1), unless the authorization is terminated or revoked.  Performed at [facility], [address] [place], [place] [address]      Medications:    insulin  aspart  0-15 Units Subcutaneous Q4H   insulin  glargine-yfgn  5 Units Subcutaneous Daily   levETIRAcetam   250 mg Intravenous Q12H   montelukast   10  mg Oral QHS   simvastatin   40 mg Oral QHS   sodium chloride  flush  3 mL Intravenous Q12H  Continuous Infusions:  ceFEPime  (MAXIPIME ) IV Stopped (12/25/24 0504)      LOS: 2 days   Erle Odell Castor  [facility]  12/25/2024, 7:51 AM

## 2024-12-25 NOTE — Progress Notes (Signed)
 Speech Language Pathology Patient Details Name: Danielle Clarke MRN: [id number] DOB: 29-Sep-1941 Today's Date: 12/25/2024  SLP spoke with patient's daughter, [name] outside of room, regarding the MBS that was completed earlier today. SLP showed [name] some of the images and comparison to the MBS patient had in May 2025. SLP explained that unfortunately, patient is at a high risk of aspiration pneumonia multiple comorbidities: limited mobility s/p hip fracture and surgery, h/o pharyngoesophageal dysphagia with current decline in swallow function, h/o compromised airway protection. She is exhibiting poor secretion management and based on MBS results, SLP suspects she is having at least penetration of secretions.SLP informed [name] that SLP will defer PO decisions to her, patient and MD and that any PO's would increase her aspiration risk. As far as comfort, SLP suspects that thin liquids would be best tolerated by patient with water  being the most potentially beneficial in aiding in secretion management. SLP to s/o at this time, please reorder if any further needs arise.   [name] IVAR Blase, MA, CCC-SLP Speech Therapy  12/25/2024, 12:46 PM

## 2024-12-26 ENCOUNTER — Ambulatory Visit: Admitting: Emergency Medicine

## 2024-12-26 DIAGNOSIS — N1832 Chronic kidney disease, stage 3b: Secondary | ICD-10-CM | POA: Diagnosis not present

## 2024-12-26 DIAGNOSIS — I10 Essential (primary) hypertension: Secondary | ICD-10-CM | POA: Diagnosis not present

## 2024-12-26 DIAGNOSIS — J690 Pneumonitis due to inhalation of food and vomit: Secondary | ICD-10-CM | POA: Diagnosis not present

## 2024-12-26 LAB — LIPID PANEL
Cholesterol: 101 mg/dL (ref 0–200)
HDL: 37 mg/dL — ABNORMAL LOW
LDL Cholesterol: 41 mg/dL (ref 0–99)
Total CHOL/HDL Ratio: 2.7 ratio
Triglycerides: 114 mg/dL
VLDL: 23 mg/dL (ref 0–40)

## 2024-12-26 LAB — GLUCOSE, CAPILLARY
Glucose-Capillary: 519 mg/dL (ref 70–99)
Glucose-Capillary: 569 mg/dL (ref 70–99)
Glucose-Capillary: 568 mg/dL (ref 70–99)
Glucose-Capillary: 464 mg/dL — ABNORMAL HIGH (ref 70–99)
Glucose-Capillary: 429 mg/dL — ABNORMAL HIGH (ref 70–99)
Glucose-Capillary: 363 mg/dL — ABNORMAL HIGH (ref 70–99)
Glucose-Capillary: 347 mg/dL — ABNORMAL HIGH (ref 70–99)

## 2024-12-26 MED ORDER — INSULIN ASPART 100 UNIT/ML IJ SOLN
0.0000 [IU] | Freq: Every day | INTRAMUSCULAR | Status: DC
  Administered 2024-12-26: 4 [IU] via SUBCUTANEOUS
  Filled 2024-12-26: qty 4

## 2024-12-26 MED ORDER — INSULIN ASPART 100 UNIT/ML IJ SOLN
0.0000 [IU] | Freq: Three times a day (TID) | INTRAMUSCULAR | Status: DC
  Administered 2024-12-26: 9 [IU] via SUBCUTANEOUS
  Administered 2024-12-27: 2 [IU] via SUBCUTANEOUS
  Administered 2024-12-27: 5 [IU] via SUBCUTANEOUS
  Filled 2024-12-26: qty 9
  Filled 2024-12-26: qty 2
  Filled 2024-12-26: qty 5

## 2024-12-26 MED ORDER — INSULIN ASPART 100 UNIT/ML IJ SOLN
2.0000 [IU] | Freq: Three times a day (TID) | INTRAMUSCULAR | Status: DC
  Administered 2024-12-26 – 2024-12-27 (×2): 2 [IU] via SUBCUTANEOUS
  Filled 2024-12-26 (×2): qty 2

## 2024-12-26 MED ORDER — LEVETIRACETAM 100 MG/ML PO SOLN
250.0000 mg | Freq: Two times a day (BID) | ORAL | Status: DC
  Administered 2024-12-26 – 2024-12-27 (×3): 250 mg via ORAL
  Filled 2024-12-26 (×5): qty 5

## 2024-12-26 MED ORDER — INSULIN GLARGINE-YFGN 100 UNIT/ML ~~LOC~~ SOLN: 10.0000 [IU] | Freq: Two times a day (BID) | SUBCUTANEOUS | Status: DC

## 2024-12-26 MED ORDER — INSULIN ASPART 100 UNIT/ML IJ SOLN
20.0000 [IU] | Freq: Once | INTRAMUSCULAR | Status: AC
  Administered 2024-12-26: 20 [IU] via SUBCUTANEOUS
  Filled 2024-12-26: qty 20

## 2024-12-26 NOTE — Progress Notes (Signed)
 [facility] PROGRESS NOTE    Progress Note  Danielle Clarke  FMW:[id number] DOB: 14-Nov-1941 DOA: 12/23/2024 PCP: [name], MD   Brief Narrative:   Danielle Clarke is an 84 y.o. female past medical history of aortic stenosis status post TAVR's, history of asthma, diabetes mellitus type 1, chronic kidney disease stage III A, essential hypertension history of left humeral fracture T12 compression fracture, prolonged QTc and history of intracranial bleed with a history of traumatic brain injury, seizure disorder discharged from the hospital for acute bronchitis, and recurrent pneumonias sent from nursing home for cough.  CT angio of the chest was done negative for PE but showed new airspace disease in the right upper lobe and a progressive foci of consolidation in the lower lung with prominent debris in the bronchi.  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  Multiple lymphadenopathy and paratracheal lymphadenopathy and sclerotic lesions at T1-T2 and sternum.  Assessment/Plan:   Acute respiratory failure with hypoxia secondary to aspiration pneumonia: She has episode of recurrent pneumonias. MBS showed very high risk for aspiration. Patient is being followed by hospice at home. Palliative care met with family and apparently they decided to move toward comfort measures, they wanted to go home. Antibiotics were discontinued.  Right paratracheal mass with diffuse lymphadenopathy and sclerotic lesions of T1-T2 and sternum: On previous admission it was discussed with the daughter they would like not to pursue a biopsy In the past and has been referred to pulmonary as an outpatient. 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 MRI of the brain showed no mass.  This showed a CVA.  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 Family decided  to move towards comfort care Continue aspirin  and Plavix .  History of seizure disorder: Continue Keppra .  Diabetes mellitus type 1 (HCC): CBGs and insulins were discontinued.  Essential hypertension: Continue metoprolol .  History of GI bleed: Continue IV PPI.  History of heart block status post pacemaker in 2020: Noted.  History of intracranial bleed: Fall precaution.  Right hip fracture status post repair: PT OT consulted.  Goals of care: Hospice follows her at home.   DVT prophylaxis: lovenox  Family Communication:daughter Status is: Inpatient Remains inpatient appropriate because: asp. PNA    Code Status:     Code Status Orders  (From admission, onward)           Start     Ordered   12/23/24 1827  Do not attempt resuscitation (DNR)- Limited -Do Not Intubate (DNI)  (Code Status)  Continuous       Question Answer Comment  If pulseless and not breathing No CPR or chest compressions.   In Pre-Arrest Conditions (Patient Is Breathing and Has A Pulse) Do not intubate. Provide all appropriate non-invasive medical interventions. Avoid ICU transfer unless indicated or required.   Consent: Discussion documented in EHR or advanced directives reviewed      12/23/24 1827           Code Status History     Date Active Date Inactive Code Status Order ID Comments User Context   12/23/2024 1805 12/23/2024 1827 Full Code [id number]  Tobie Mario GAILS, MD ED   12/08/2024 1740 12/14/2024 1838 Limited: Do not attempt resuscitation (DNR) -DNR-LIMITED -Do Not Intubate/DNI  [id number]  Celinda Alm Lot, MD ED   11/28/2024 [phone] 11/28/2024 1836 Limited: Do not  attempt resuscitation (DNR) -DNR-LIMITED -Do Not Intubate/DNI  [id number]  Davia Nydia POUR, MD Inpatient   11/23/2024 1026 11/28/2024 0805 Full Code [id number]  Willette Adriana LABOR, MD ED   04/19/2024 1452 04/25/2024 1920 Limited: Do not attempt resuscitation (DNR) -DNR-LIMITED -Do Not Intubate/DNI  [id number]  Rodman Recardo NOVAK, NP  Inpatient   04/15/2024 1945 04/19/2024 1452 Full Code [id number]  Shona Terry SAILOR, DO ED   04/01/2024 1904 04/04/2024 2307 Full Code [id number]  Moody Alto, MD ED   02/06/2023 1040 02/07/2023 1928 Full Code [id number]  Arvil Kate BIRCH, NP Inpatient   01/11/2023 0923 01/11/2023 1624 Full Code [id number]  Wendel Lurena POUR, MD Inpatient   11/19/2021 1520 11/25/2021 1616 Full Code [id number]  Waddell Rake, MD ED   10/14/2020 1755 10/16/2020 1818 Full Code [id number]  Odell Celinda Balo, MD Inpatient   02/14/2019 1449 02/28/2019 1524 Full Code [id number]  Pegge Toribio PARAS, PA-C Inpatient   02/14/2019 1449 02/14/2019 1449 Full Code [id number]  Pegge Toribio PARAS, PA-C Inpatient   02/07/2019 0036 02/14/2019 1440 Full Code [id number]  Franky Redia SAILOR, MD ED   08/05/2017 1448 08/08/2017 1814 Full Code [id number]  Will Almarie MATSU, MD ED   01/04/2017 1819 01/07/2017 1438 Full Code [id number]  Kara Reaper, MD ED   07/31/2014 1654 08/05/2014 2030 Full Code [id number]  Leeann Catalina HERO, MD Inpatient   12/29/2013 2134 01/01/2014 1811 Full Code [id number]  Lonzell Emeline HERO, DO ED   01/02/2013 1835 01/03/2013 2131 Full Code [id number]  Canda Damien BRAVO, RN Inpatient      Advance Directive Documentation    Flowsheet Row Most Recent Value  Type of Advance Directive Healthcare Power of Attorney  Pre-existing out of facility DNR order (yellow form or pink MOST form) --  MOST Form in Place? --      IV Access:   Peripheral IV   Procedures and diagnostic studies:   DG Swallowing Func-Speech Pathology Result Date: 12/25/2024 Table formatting from the original result was not included. Modified Barium Swallow Study Patient Details Name: Danielle Clarke MRN: [id number] Date of Birth: [birth year] Today's Date: 12/25/2024 HPI/PMH: HPI: Danielle Clarke is an 84 y.o. female who presented to [facility] 12/23/24 from [facility] with cough. Admitted with acute respiratory failure w/ hypoxia 2/2 aspiration PNA. Also with R paratracheal mass w/  diffuse lymphadenopathy and sclerotic lesions of T1-T2 and sternum. 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 Hx of ACDF -CT cervical spine Dec 2025 showed substantial hardware loosening and bone resorption at C7-T1, also C3. Associated nonspecific prevertebral soft tissue swelling there. Has chronic dysphagia with recurring pna, pharyngeal dysphagia and compromised airway closure; esophageal dysphagia with probable post-prandial aspiration.  Well known to SLP services. Most recent MBS was 04/16/24 with noted functional oral phase; chronic ACDF impacted pharyngeal function; thin/nectar thick spilled into larynx, pt able to eject/protect airway at level of arytenoids/base of epiglottis. Concerns were primarily for post-prandial aspiration  due to esophageal deficits. Hx of esophageal  dysmotility with significant clinical impact. PMH:  ACDF 07/2014. DM1, HTN, HLD, aortic stenosis, s/p TAVR, seizures, recurrent falls, multiple bone fractures in the past, hemorrhagic stroke in 2020, progressive weakness, generalized cachexia, under hospice care at home. Clinical Impression: Rec: SLP to discuss MBS with patient and family. PO recommendations to be based on patient's GOC and discussions with patient, family, medical care team. Patient has had a decline in her swallow function since MBS in May 2025 and has had documented prevertebral soft tissue swelling  at C3 and hardware loosening at C7-11 and C3. She is exhibiting overt s/s of aspiration, limited mobility following recent hip fracture, respiratory compromise, fraility, documented h/o compromised airway protection during the swallow, esophageal dysmotility. Patient presents with a moderate pharyngoesophageal dysphagia with impact from prevertebral soft tissue swelling at C3 (reported in 11/23/24 CT Cervical spine) which prevented full epiglottic inversion and significantly reduced barium flow through past it. Almost immediate retrograde flow  of barium back up through PES and into pharynx occured with all consistencies. This also led to silent aspiration after the swallow from residuals spilling over arytenoids and into airway with nectar and thin liquids. Penetration that remained above the vocal cords and exited laryngeal vestibule visualized with honey thick liquids but no aspiration visualized during this study. Prior to the study, patient with weak, congested sounding cough that was not productive. During the study, she exhibited this same congested sounding cough but did have instances of expectorating some barium. Patient was in noticeable discomfort by the time we reached the puree solid bolus of the MBSimp.    DIGEST Swallow Severity Rating*  Safety: 2  Efficiency:1  Overall Pharyngeal Swallow Severity: 2 1: mild; 2: moderate; 3: severe; 4: profound *The Dynamic Imaging Grade of Swallowing Toxicity is standardized for the head and neck cancer population, however, demonstrates promising clinical applications across populations to standardize the clinical rating of pharyngeal swallow safety and severity. Recommendations/Plan: Swallowing Evaluation Recommendations Swallowing Evaluation Recommendations Recommendations: NPO Medication Administration: Via alternative means Oral care recommendations: Oral care QID (4x/day) Treatment Plan Treatment Plan Treatment recommendations: Therapy as outlined in treatment plan below Follow-up recommendations: No SLP follow up Functional status assessment: Patient has had a recent decline in their functional status and/or demonstrates limited ability to make significant improvements in function in a reasonable and predictable amount of time. Treatment frequency: Min 1x/week Treatment duration: 1 week Interventions: Patient/family education Recommendations Recommendations for follow up therapy are one component of a multi-disciplinary discharge planning process, led by the attending physician.  Recommendations may  be updated based on patient status, additional functional criteria and insurance authorization. Assessment: Orofacial Exam: Orofacial Exam Oral Cavity: Oral Hygiene: WFL Oral Cavity - Dentition: Adequate natural dentition Orofacial Anatomy: WFL Oral Motor/Sensory Function: WFL Anatomy: Anatomy: Presence of cervical hardware Boluses Administered: Boluses Administered Boluses Administered: Thin liquids (Level 0); Mildly thick liquids (Level 2, nectar thick); Moderately thick liquids (Level 3, honey thick); Puree  Oral Impairment Domain: Oral Impairment Domain Lip Closure: No labial escape Tongue control during bolus hold: Not tested Bolus transport/lingual motion: Brisk tongue motion Oral residue: Complete oral clearance Location of oral residue : N/A Initiation of pharyngeal swallow : Valleculae  Pharyngeal Impairment Domain: Pharyngeal Impairment Domain Soft palate elevation: No bolus between soft palate (SP)/pharyngeal wall (PW) Laryngeal elevation: Minimal superior movement of thyroid  cartilage with minimal approximation of arytenoids to epiglottic petiole Anterior hyoid excursion: Complete anterior movement Epiglottic movement: Partial inversion Laryngeal vestibule closure: Incomplete, narrow column air/contrast in laryngeal vestibule Pharyngeal stripping wave : Present - diminished Pharyngeal contraction (A/P view only): N/A Pharyngoesophageal segment opening: Partial distention/partial duration, partial obstruction of flow Tongue base retraction: No contrast between tongue base and posterior pharyngeal wall (PPW) Pharyngeal residue: Collection of residue within or on pharyngeal structures Location of pharyngeal residue: Pharyngeal wall; Valleculae; Pyriform sinuses  Esophageal Impairment Domain: Esophageal Impairment Domain Esophageal clearance upright position: Esophageal retention with retrograde flow through the PES Pill: No data recorded Penetration/Aspiration Scale Score: Penetration/Aspiration Scale Score  1.  Material does  not enter airway: Puree 2.  Material enters airway, remains ABOVE vocal cords then ejected out: Moderately thick liquids (Level 3, honey thick) 8.  Material enters airway, passes BELOW cords without attempt by patient to eject out (silent aspiration) : Thin liquids (Level 0); Mildly thick liquids (Level 2, nectar thick) Compensatory Strategies: Compensatory Strategies Compensatory strategies: No   General Information: No data recorded Diet Prior to this Study: NPO   Temperature : Febrile (99.3)   Respiratory Status: WFL   Supplemental O2: Nasal cannula   History of Recent Intubation: No  Behavior/Cognition: Alert; Cooperative Self-Feeding Abilities: Needs assist with self-feeding Baseline vocal quality/speech: Not observed Volitional Cough: Able to elicit Volitional Swallow: Able to elicit Exam Limitations: No limitations Goal Planning: Prognosis for improved oropharyngeal function: Guarded Barriers to Reach Goals: Severity of deficits; Overall medical prognosis No data recorded No data recorded Consulted and agree with results and recommendations: Pt unable/family or caregiver not available Pain: Pain Assessment Pain Assessment: Faces Faces Pain Scale: 0 Pain Location: R hip Pain Descriptors / Indicators: Aching; Discomfort; Grimacing; Guarding Pain Intervention(s): Limited activity within patient's tolerance; Monitored during session; Repositioned End of Session: Start Time:SLP Start Time (ACUTE ONLY): 9062 Stop Time: SLP Stop Time (ACUTE ONLY): 0946 Time Calculation:SLP Time Calculation (min) (ACUTE ONLY): 9 min Charges: SLP Evaluations $ SLP Speech Visit: 1 Visit SLP Evaluations $BSS Swallow: 1 Procedure $MBS Swallow: 1 Procedure SLP visit diagnosis: SLP Visit Diagnosis: Dysphagia, pharyngoesophageal phase (R13.14) Past Medical History: Past Medical History: Diagnosis Date  Aortic stenosis, mild   Arthritis   Asthmatic bronchitis   with colds per patient  Carotid artery disease   40-59%  bilateral ICA stenosis  Cataract   Cutaneous abscess of right foot   Glaucoma   Heart murmur   Dr Claudene is her  cardiologist.   Hyperlipemia   Hypertension   Dr. Claudene ~ 2 years ago  Neck fracture [facility])   july 2013  Osteoporosis   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  20mm S3UR via TF approach with Dr. Wendel and Dr. Maryjane  Syncope   Type 1 diabetes mellitus [facility]  Past Surgical History: Past Surgical History: Procedure Laterality Date  ANKLE FRACTURE SURGERY Left 2001  steel plate and 3 screws   ANTERIOR APPROACH HEMI HIP ARTHROPLASTY Right 11/24/2024  Procedure: HEMIARTHROPLASTY, HIP, DIRECT ANTERIOR APPROACH, FOR FRACTURE;  Surgeon: [name] Franky SQUIBB, MD;  Location: [facility] OR;  Service: Orthopedics;  Laterality: Right;  ANTERIOR CERVICAL CORPECTOMY N/A 07/31/2014  Procedure: Cervical Four to Cervical Six Corpectomy;  Surgeon: Catalina CHRISTELLA Stains, MD;  Location: [facility] NEURO ORS;  Service: Neurosurgery;  Laterality: N/A;  C4 to C6 Corpectomy  BREAST SURGERY  1988  CATARACT EXTRACTION W/ INTRAOCULAR LENS  IMPLANT, BILATERAL Bilateral 2011  right and then left  EYE SURGERY    FRACTURE SURGERY    HAMMER TOE SURGERY  1998  I & D EXTREMITY Right 09/27/2018  Procedure: IRRIGATION AND DEBRIDEMENT RIGHT FOOT;  Surgeon: Harden Jerona GAILS, MD;  Location: [facility] OR;  Service: Orthopedics;  Laterality: Right;  INSERT / REPLACE / REMOVE PACEMAKER    INTRAOPERATIVE TRANSTHORACIC ECHOCARDIOGRAM N/A 02/06/2023  Procedure: INTRAOPERATIVE TRANSTHORACIC ECHOCARDIOGRAM;  Surgeon: Wendel Lurena POUR, MD;  Location: [facility] OR;  Service: Open Heart Surgery;  Laterality: N/A;  IRRIGATION AND DEBRIDEMENT ELBOW Right 11/24/2024  Procedure: IRRIGATION AND DEBRIDEMENT ELBOW WITH WOUND CLOSURE;  Surgeon: [name] Franky SQUIBB, MD;  Location: [facility] OR;  Service: Orthopedics;  Laterality: Right;  JOINT REPLACEMENT  PACEMAKER IMPLANT N/A 02/10/2019  Procedure: PACEMAKER IMPLANT;  Surgeon: Waddell Danelle ORN, MD;  Location: [facility] INVASIVE CV LAB;  Service:  Cardiovascular;  Laterality: N/A;  RIGHT/LEFT HEART CATH AND CORONARY ANGIOGRAPHY N/A 01/11/2023  Procedure: RIGHT/LEFT HEART CATH AND CORONARY ANGIOGRAPHY;  Surgeon: Wendel Lurena POUR, MD;  Location: [facility] INVASIVE CV LAB;  Service: Cardiovascular;  Laterality: N/A;  TONSILLECTOMY AND ADENOIDECTOMY  1948  TOTAL SHOULDER REPLACEMENT  2010  right shoulder   TRANSCATHETER AORTIC VALVE REPLACEMENT, TRANSFEMORAL N/A 02/06/2023  Procedure: Transcatheter Aortic Valve Replacement, Transfemoral;  Surgeon: Thukkani, Arun K, MD;  Location: [facility] OR;  Service: Open Heart Surgery;  Laterality: N/A;  TUBAL LIGATION  1979  TYMPANOSTOMY TUBE PLACEMENT Bilateral  [name] IVAR Blase, MA, CCC-SLP Speech Therapy 12/25/2024, 11:45 AM  MR BRAIN WO CONTRAST Result Date: 12/24/2024 EXAM: MRI BRAIN WITHOUT CONTRAST 12/24/2024 05:05:49 PM TECHNIQUE: Multiplanar multisequence MRI of the head/brain was performed without the administration of intravenous contrast. COMPARISON: MR head without contrast 04/16/2024. CT head without contrast 11/23/2024. CLINICAL HISTORY: h/o h'gic cva/ now aspirating not sure if new cva. History of hemorrhagic cerebrovascular accident. Now aspirating, not sure if new cerebrovascular accident. FINDINGS: BRAIN AND VENTRICLES: A 6 mm focus of restricted diffusion is present in the high posterior right occipital lobe. No other acute infarct is present. Chronic encephalomalacia of the anterior left frontal lobe is stable. Chronic encephalomalacia is present in the anterior temporal tips bilaterally. Remote blood products are present within these areas of encephalomalacia consistent with prior trauma. No intracranial hemorrhage. No mass. No midline shift. No hydrocephalus. The sella is unremarkable. Normal flow voids. ORBITS: Bilateral lens replacements are noted. The globes and orbits are otherwise within normal limits. SINUSES AND MASTOIDS: A fluid level is present in the left maxillary sinus. A small left mild inferior  mastoid effusions are present bilaterally. Anterior right ethmoid air cells and the right frontal sinus are opacified, stable. BONES AND SOFT TISSUES: Normal marrow signal. No soft tissue abnormality. IMPRESSION: 1. Acute 6 mm infarct in the high posterior right occipital lobe. 2. Stable chronic encephalomalacia in the anterior left frontal lobe and anterior temporal tips bilaterally with remote blood products, compatible with prior trauma. Electronically signed by: Lonni Necessary MD 12/24/2024 05:56 PM EST RP Workstation: [id number]     Medical Consultants:   None.   Subjective:    Danielle Clarke plans today she wants go home.  Objective:    Vitals:   12/25/24 0724 12/25/24 2033 12/25/24 2325 12/26/24 0415  BP: (!) 132/46 (!) 144/47 (!) 141/43 (!) 139/43  Pulse: 99 (!) 101 78 84  Resp: 18  20 19   Temp: 99.3 F (37.4 C)     TempSrc: Oral     SpO2: 95%  97% 97%  Weight:      Height:       SpO2: 97 % O2 Flow Rate (L/min): 4 L/min (Pt. was placed on 4L [place] prev shift per RN due to drop in oxygen level)   Intake/Output Summary (Last 24 hours) at 12/26/2024 0843 Last data filed at 12/25/2024 2000 Gross per 24 hour  Intake 240 ml  Output 150 ml  Net 90 ml   Filed Weights   12/23/24 1952 12/24/24 0411 12/25/24 0448  Weight: 37.8 kg 37.5 kg 40.4 kg    Exam: General exam: In no acute distress. Respiratory system: Good air movement and clear to auscultation. Cardiovascular system: S1 & S2 heard, RRR. No JVD. Gastrointestinal system: Abdomen is nondistended, soft and nontender.  Extremities: No pedal edema. Skin: No rashes, lesions or ulcers Psychiatry: Judgement and insight appear normal. Mood & affect appropriate. Data Reviewed:    Labs: Basic Metabolic Panel: Recent Labs  Lab 12/23/24 1253 12/23/24 1309 12/23/24 1310 12/23/24 1458  NA 133* 133* 133*  --   K 5.0 4.7 4.7  --   CL 97* 100  --   --   CO2 25  --   --   --   GLUCOSE 200* 204*  --   --    BUN 28* 33*  --   --   CREATININE 0.58 0.60  --   --   CALCIUM 8.8*  --   --   --   MG  --   --   --  2.0   GFR Estimated Creatinine Clearance: 34.6 mL/min (by C-G formula based on SCr of 0.6 mg/dL). Liver Function Tests: Recent Labs  Lab 12/23/24 1253 12/23/24 1458  AST 29 27  ALT 18 21  ALKPHOS 144* 144*  BILITOT 0.3 0.3  PROT 7.3 7.2  ALBUMIN  3.0* 3.0*   No results for input(s): LIPASE, AMYLASE in the last 168 hours. No results for input(s): AMMONIA in the last 168 hours. Coagulation profile Recent Labs  Lab 12/23/24 1253  INR 1.1   COVID-19 Labs  No results for input(s): DDIMER, FERRITIN, LDH, CRP in the last 72 hours.  Lab Results  Component Value Date   SARSCOV2NAA NEGATIVE 12/23/2024   SARSCOV2NAA NEGATIVE 12/08/2024   SARSCOV2NAA NEGATIVE 04/15/2024   SARSCOV2NAA NEGATIVE 04/01/2024    CBC: Recent Labs  Lab 12/23/24 1253 12/23/24 1309 12/23/24 1310  WBC 15.1*  --   --   NEUTROABS 12.9*  --   --   HGB 9.5* 10.5* 10.5*  HCT 29.9* 31.0* 31.0*  MCV 82.8  --   --   PLT 355  --   --    Cardiac Enzymes: Recent Labs  Lab 12/23/24 1458  CKTOTAL 20*   BNP (last 3 results) Recent Labs    12/08/24 1218 12/23/24 1253  PROBNP 1,201.0* 1,849.0*   CBG: Recent Labs  Lab 12/24/24 2334 12/25/24 0414 12/25/24 0722 12/25/24 1113 12/25/24 1606  GLUCAP 95 89 105* 115* 128*   D-Dimer: No results for input(s): DDIMER in the last 72 hours. Hgb A1c: No results for input(s): HGBA1C in the last 72 hours. Lipid Profile: Recent Labs    12/26/24 0424  CHOL 101  HDL 37*  LDLCALC 41  TRIG 885  CHOLHDL 2.7   Thyroid  function studies: No results for input(s): TSH, T4TOTAL, T3FREE, THYROIDAB in the last 72 hours.  Invalid input(s): FREET3 Anemia work up: No results for input(s): VITAMINB12, FOLATE, FERRITIN, TIBC, IRON, RETICCTPCT in the last 72 hours. Sepsis Labs: Recent Labs  Lab 12/23/24 1253  12/23/24 1309  WBC 15.1*  --   LATICACIDVEN  --  1.3   Microbiology Recent Results (from the past 240 hours)  Culture, blood (routine x 2)     Status: None (Preliminary result)   Collection Time: 12/23/24 12:54 PM   Specimen: BLOOD RIGHT ARM  Result Value Ref Range Status   Specimen Description BLOOD RIGHT ARM  Final   Special Requests NONE  Final   Culture   Final    NO GROWTH < 24 HOURS Performed at [facility], [address], [place], [place] [address]    Report Status PENDING  Incomplete  Culture, blood (routine x 2)     Status: None (Preliminary  result)   Collection Time: 12/23/24 12:54 PM   Specimen: BLOOD RIGHT ARM  Result Value Ref Range Status   Specimen Description BLOOD RIGHT ARM  Final   Special Requests   Final    BOTTLES DRAWN AEROBIC AND ANAEROBIC Blood Culture adequate volume   Culture   Final    NO GROWTH 3 DAYS Performed at [facility], [address], [place], [place] [address]    Report Status PENDING  Incomplete  Resp panel by RT-PCR (RSV, Flu A&B, Covid)     Status: None   Collection Time: 12/23/24 12:54 PM   Specimen: Nasal Swab  Result Value Ref Range Status   SARS Coronavirus 2 by RT PCR NEGATIVE NEGATIVE Final   Influenza A by PCR NEGATIVE NEGATIVE Final   Influenza B by PCR NEGATIVE NEGATIVE Final    Comment: (NOTE) The Xpert Xpress SARS-CoV-2/FLU/RSV plus assay is intended as an aid in the diagnosis of influenza from Nasopharyngeal swab specimens and should not be used as a sole basis for treatment. Nasal washings and aspirates are unacceptable for Xpert Xpress SARS-CoV-2/FLU/RSV testing.  Fact Sheet for Patients: bloggercourse.com  Fact Sheet for Healthcare Providers: seriousbroker.it  This test is not yet approved or cleared by the [place]  FDA and has been authorized for detection and/or diagnosis of SARS-CoV-2 by FDA under an Emergency Use Authorization (EUA). This  EUA will remain in effect (meaning this test can be used) for the duration of the COVID-19 declaration under Section 564(b)(1) of the Act, 21 U.S.C. section 360bbb-3(b)(1), unless the authorization is terminated or revoked.     Resp Syncytial Virus by PCR NEGATIVE NEGATIVE Final    Comment: (NOTE) Fact Sheet for Patients: bloggercourse.com  Fact Sheet for Healthcare Providers: seriousbroker.it  This test is not yet approved or cleared by the [place]  FDA and has been authorized for detection and/or diagnosis of SARS-CoV-2 by FDA under an Emergency Use Authorization (EUA). This EUA will remain in effect (meaning this test can be used) for the duration of the COVID-19 declaration under Section 564(b)(1) of the Act, 21 U.S.C. section 360bbb-3(b)(1), unless the authorization is terminated or revoked.  Performed at [facility], [address] [place], [place] [address]      Medications:     stroke: early stages of recovery book   Does not apply Once   aspirin  EC  81 mg Oral Daily   clopidogrel   75 mg Oral Daily   metoprolol  tartrate  12.5 mg Oral BID   pantoprazole   40 mg Oral BID   sodium chloride  flush  3 mL Intravenous Q12H   Continuous Infusions:      LOS: 3 days   Erle Odell Castor  [facility]  12/26/2024, 8:43 AM

## 2024-12-26 NOTE — Progress Notes (Incomplete)
 [id number] [facility] Liaison Note   Received a request from Care Manager for hospice services at home after discharge. Spoke with daughter, [name] to initiate education related to hospice philosophy, services and team approach to care. [name] verbalized understanding as patient was previously enrolled with hospice care prior to rehab.  Per discussion, the plan is for discharge when medically stable.  DME needs discussed. Family has all necessary DME at this time.  Please send signed and completed DNR home with the patient/family.  Please provide prescriptions at discharge as needed to ensure ongoing symptom management.  [facility] information and contact numbers given to [name].  Above information shared with Care Manager.  Please call with any questions or concerns.  Thank you for the opportunity to participate in this patient's care.  Inocente Jacobs, BSN, RN [employer] [phone]

## 2024-12-26 NOTE — Progress Notes (Signed)
 Danielle Clarke and Dr.Feliz Celinda notified of pt's blood sugars being high. (See lab results )

## 2024-12-26 NOTE — Plan of Care (Signed)
 Problem: Education: Goal: Ability to describe self-care measures that may prevent or decrease complications (Diabetes Survival Skills Education) will improve Outcome: Progressing Goal: Individualized Educational Video(s) Outcome: Progressing   Problem: Coping: Goal: Ability to adjust to condition or change in health will improve Outcome: Progressing   Problem: Fluid Volume: Goal: Ability to maintain a balanced intake and output will improve Outcome: Progressing   Problem: Health Behavior/Discharge Planning: Goal: Ability to identify and utilize available resources and services will improve Outcome: Progressing Goal: Ability to manage health-related needs will improve Outcome: Progressing   Problem: Metabolic: Goal: Ability to maintain appropriate glucose levels will improve Outcome: Progressing   Problem: Nutritional: Goal: Maintenance of adequate nutrition will improve Outcome: Progressing Goal: Progress toward achieving an optimal weight will improve Outcome: Progressing   Problem: Skin Integrity: Goal: Risk for impaired skin integrity will decrease Outcome: Progressing   Problem: Tissue Perfusion: Goal: Adequacy of tissue perfusion will improve Outcome: Progressing   Problem: Education: Goal: Knowledge of General Education information will improve Description: Including pain rating scale, medication(s)/side effects and non-pharmacologic comfort measures Outcome: Progressing   Problem: Health Behavior/Discharge Planning: Goal: Ability to manage health-related needs will improve Outcome: Progressing   Problem: Clinical Measurements: Goal: Ability to maintain clinical measurements within normal limits will improve Outcome: Progressing Goal: Will remain free from infection Outcome: Progressing Goal: Diagnostic test results will improve Outcome: Progressing Goal: Respiratory complications will improve Outcome: Progressing Goal: Cardiovascular complication will  be avoided Outcome: Progressing   Problem: Activity: Goal: Risk for activity intolerance will decrease Outcome: Progressing   Problem: Nutrition: Goal: Adequate nutrition will be maintained Outcome: Progressing   Problem: Coping: Goal: Level of anxiety will decrease Outcome: Progressing   Problem: Elimination: Goal: Will not experience complications related to bowel motility Outcome: Progressing Goal: Will not experience complications related to urinary retention Outcome: Progressing   Problem: Pain Managment: Goal: General experience of comfort will improve and/or be controlled Outcome: Progressing   Problem: Safety: Goal: Ability to remain free from injury will improve Outcome: Progressing   Problem: Skin Integrity: Goal: Risk for impaired skin integrity will decrease Outcome: Progressing   Problem: Education: Goal: Knowledge of disease or condition will improve Outcome: Progressing Goal: Knowledge of secondary prevention will improve (MUST DOCUMENT ALL) Outcome: Progressing Goal: Knowledge of patient specific risk factors will improve (DELETE if not current risk factor) Outcome: Progressing   Problem: Ischemic Stroke/TIA Tissue Perfusion: Goal: Complications of ischemic stroke/TIA will be minimized Outcome: Progressing   Problem: Coping: Goal: Will verbalize positive feelings about self Outcome: Progressing Goal: Will identify appropriate support needs Outcome: Progressing   Problem: Health Behavior/Discharge Planning: Goal: Ability to manage health-related needs will improve Outcome: Progressing Goal: Goals will be collaboratively established with patient/family Outcome: Progressing   Problem: Self-Care: Goal: Ability to participate in self-care as condition permits will improve Outcome: Progressing Goal: Verbalization of feelings and concerns over difficulty with self-care will improve Outcome: Progressing Goal: Ability to communicate needs  accurately will improve Outcome: Progressing   Problem: Nutrition: Goal: Risk of aspiration will decrease Outcome: Progressing Goal: Dietary intake will improve Outcome: Progressing   Problem: Education: Goal: Knowledge of the prescribed therapeutic regimen will improve Outcome: Progressing   Problem: Coping: Goal: Ability to identify and develop effective coping behavior will improve Outcome: Progressing   Problem: Clinical Measurements: Goal: Quality of life will improve Outcome: Progressing   Problem: Respiratory: Goal: Verbalizations of increased ease of respirations will increase Outcome: Progressing   Problem: Role Relationship: Goal: Family's ability to  cope with current situation will improve Outcome: Progressing Goal: Ability to verbalize concerns, feelings, and thoughts to partner or family member will improve Outcome: Progressing   Problem: Pain Management: Goal: Satisfaction with pain management regimen will improve Outcome: Progressing

## 2024-12-26 NOTE — Progress Notes (Signed)
 "                                                                                                                                                                                                         Palliative Medicine Progress Note   Patient Name: Danielle Clarke       Date: 12/26/2024 DOB: 07/21/1941  Age: 84 y.o. MRN#: [id number] Attending Physician: Odell Celinda Balo, MD Primary Care Physician: [name], MD Admit Date: 12/23/2024    HPI/Patient Profile: 84 y.o. female  with past medical history of diabetes type 1, aortic stenosis status post TAVR, CKD 3A, history of ICH with TBI, seizure disorder, known right paratracheal mass, T12 compression fracture, and recurrent pneumonia who presented to the ED on 12/23/2024 with cough.  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 Imaging also shows right peritracheal mass with diffuse lymphadenopathy and sclerotic lesions of T1-T2 and sternum.    Palliative Medicine has been consulted for goals of care discussions and complex medical decision making.   Subjective: Chart reviewed. Discussed with Dr. Odell Celinda and RN. Patient has been tolerating PO intake and seems able to take oral medications.  Daughter requested to restart CBG monitoring,  and patient's CBG has been elevated (519, 569, 464). Subsequently Insulin  has been restarted, both sliding scale (ACHS) and long-acting. Also restarted keppra .   Patient assessed. She has respiratory congestion, but is not able to cough it up, I also note she is coughing after taking sips of water , but is not in distress.   I met with daughter and son at bedside. They are in agreement to continue CBG monitoring and insulin  coverage and keppra , but otherwise comfort-focused care. Reviewed that patient has severe dysphagia and will continue to aspirate - family understands. They share that patient has been  expressing that she is tired.  Reviewed plan for home with hospice. Questions and concerns were addressed. Emotional support provided.   Additional time spent in communication and coordination of care with attending MD, RN, [facility] team, and hospice liaison.     Objective:  Physical Exam Vitals reviewed.  Constitutional:      General: She is not in acute distress.    Appearance: She is cachectic. She is ill-appearing.  Pulmonary:     Effort: Pulmonary effort is normal.  Neurological:     Mental Status: She is alert and oriented to person, place, and time.  Palliative Medicine Assessment & Plan   Assessment: Principal Problem:   Pneumonia Active Problems:   Diabetes mellitus type 1 (HCC)   Essential hypertension, benign   CKD (chronic kidney disease), stage III (HCC)   Acute hypoxic respiratory failure (HCC)   Pressure injury of skin    Recommendations/Plan: Continue comfort-focused care Restart Keppra  (oral solution) 250 mg twice daily CBG checks ACHS Restart insulin  (sliding scale and long-acting) Plan for home with hospice PMT will continue to follow and support   Code Status: DNR - comfort   Prognosis:  < 6 months  Discharge Planning: Home with Hospice   Thank you for allowing the Palliative Medicine Team to assist in the care of this patient.   I personally spent a total of 65 minutes in the care of the patient today including preparing to see the patient, getting/reviewing separately obtained history, performing a medically appropriate exam/evaluation, counseling and educating, placing orders, documenting clinical information in the EHR, and coordinating care.   Signed: Shizuko Wojdyla B Alannis Hsia, NP   Please contact Palliative Medicine Team phone at [phone] for questions and concerns.  For individual providers, please see AMION.      "

## 2024-12-26 NOTE — Progress Notes (Signed)
 Physical Therapy Treatment Patient Details Name: Danielle Clarke MRN: [id number] DOB: 1941/05/31 Today's Date: 12/26/2024   History of Present Illness 84 y.o. female [facility] 12/23/24 from [facility] with cough. Admitted with acute respiratory failure w/ hypoxia 2/2 aspiration PNA. Also with R paratracheal mass w/ diffuse lymphadenopathy and sclerotic lesions of T1-T2 and sternum. 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 PMH:  DM1, HTN, HLD, aortic stenosis, s/p TAVR, seizures, recurrent falls, multiple bone fractures in the past, hemorrhagic stroke in 2020, progressive weakness, generalized cachexia, under hospice care at home.    PT Comments  Pt received in supine with daughter and son present. Focused session on safety for return home with 24/7 assist and hospice care. Demonstrated technique for bed mobility with pt requiring up to ModA. Pt was then able to step-pivot to/from the [facility] with MinA and RW. Family assisting with second transfer with cueing for technique. Pt was fatigued with return to supine. [name] daughter able to then demonstrate step-pivot transfer with RW and lateral scoot transfer with PT. Also worked on correcting losses of balance when performing step-pivot transfers. Discussed WC bump for step negotiation with handout given at end of session. Also gave LE HEP with instructions on QR code and website log in. Will continue to follow acutely for caregiver education.     If plan is discharge home, recommend the following: A lot of help with bathing/dressing/bathroom;Assistance with cooking/housework;Help with stairs or ramp for entrance;Assist for transportation;A lot of help with walking and/or transfers   Can travel by private vehicle      PTAR for transport home  Equipment Recommendations  None recommended by PT       Precautions / Restrictions Precautions Precautions: Fall Recall of Precautions/Restrictions:  Impaired Restrictions Weight Bearing Restrictions Per Provider Order: No     Mobility  Bed Mobility Overal bed mobility: Needs Assistance Bed Mobility: Rolling, Sidelying to Sit, Sit to Sidelying Rolling: Min assist, Used rails Sidelying to sit: HOB elevated, Mod assist    Sit to sidelying: Mod assist General bed mobility comments: cues for sequencing with MinA to roll. ModA to raise trunk. Able to return to supine with assist for BLE management    Transfers Overall transfer level: Needs assistance Equipment used: Rolling walker (2 wheels) Transfers: Sit to/from Stand, Bed to chair/wheelchair/BSC Sit to Stand: Min assist   Step pivot transfers: Min assist    General transfer comment: MinA for boost-up and steadying assist. Able to step-pivot with MinA. Daughter able to demonstrate step-pivot transfer safely      Balance Overall balance assessment: Needs assistance Sitting-balance support: Bilateral upper extremity supported, Feet supported Sitting balance-Leahy Scale: Poor     Standing balance support: Bilateral upper extremity supported, During functional activity, Reliant on assistive device for balance Standing balance-Leahy Scale: Poor Standing balance comment: reliant on UE and external support       Communication Communication Communication: Impaired Factors Affecting Communication: Hearing impaired  Cognition Arousal: Lethargic Behavior During Therapy: WFL for tasks assessed/performed   PT - Cognitive impairments: Problem solving, Awareness, Memory, Attention, Initiation, Sequencing, Safety/Judgement    Following commands: Impaired Following commands impaired: Follows one step commands with increased time    Cueing Cueing Techniques: Verbal cues, Gestural cues     General Comments General comments (skin integrity, edema, etc.): [name] son and daughter present and supportive. Provided education on step-pivot and lateral scoot transfers. Daugher able to  demonstrate step-pivot transfer with pt and step-pivot and  lateral scoot transfer with this PT. Gave LE HEP (ankle pump, quad set, heel slide, hip ABD/ADD, marches, LAQ) and education on WC bump      Pertinent Vitals/Pain Pain Assessment Pain Assessment: Faces Faces Pain Scale: Hurts little more Pain Location: grimacing with posterior pericare Pain Descriptors / Indicators: Discomfort, Grimacing Pain Intervention(s): Limited activity within patient's tolerance, Monitored during session, Repositioned     PT Goals (current goals can now be found in the care plan section) Acute Rehab PT Goals Patient Stated Goal: to go home PT Goal Formulation: With patient Time For Goal Achievement: 01/07/25 Potential to Achieve Goals: Good Progress towards PT goals: Progressing toward goals    Frequency    Min 2X/week       AM-PAC PT 6 Clicks Mobility   Outcome Measure  Help needed turning from your back to your side while in a flat bed without using bedrails?: A Little Help needed moving from lying on your back to sitting on the side of a flat bed without using bedrails?: A Lot Help needed moving to and from a bed to a chair (including a wheelchair)?: A Little Help needed standing up from a chair using your arms (e.g., wheelchair or bedside chair)?: A Little Help needed to walk in hospital room?: Total Help needed climbing 3-5 steps with a railing? : Total 6 Click Score: 13    End of Session Equipment Utilized During Treatment: Gait belt;Oxygen Activity Tolerance: Patient limited by fatigue Patient left: in bed;with call bell/phone within reach;with bed alarm set;with family/visitor present Nurse Communication: Mobility status PT Visit Diagnosis: Unsteadiness on feet (R26.81);History of falling (Z91.81);Muscle weakness (generalized) (M62.81);Difficulty in walking, not elsewhere classified (R26.2)     Time: 1427-1500 PT Time Calculation (min) (ACUTE ONLY): 33 min  Charges:     $Therapeutic Activity: 23-37 mins PT General Charges $$ ACUTE PT VISIT: 1 Visit                    Kate ORN, PT, DPT Secure Chat Preferred  Rehab Office [phone]   Kate BRAVO Wendolyn 12/26/2024, 4:42 PM

## 2024-12-27 DIAGNOSIS — R531 Weakness: Secondary | ICD-10-CM

## 2024-12-27 DIAGNOSIS — J690 Pneumonitis due to inhalation of food and vomit: Secondary | ICD-10-CM | POA: Diagnosis not present

## 2024-12-27 LAB — GLUCOSE, CAPILLARY
Glucose-Capillary: 443 mg/dL — ABNORMAL HIGH (ref 70–99)
Glucose-Capillary: 262 mg/dL — ABNORMAL HIGH (ref 70–99)
Glucose-Capillary: 184 mg/dL — ABNORMAL HIGH (ref 70–99)
Glucose-Capillary: 167 mg/dL — ABNORMAL HIGH (ref 70–99)

## 2024-12-27 MED ORDER — INSULIN GLARGINE-YFGN 100 UNIT/ML ~~LOC~~ SOLN
10.0000 [IU] | Freq: Two times a day (BID) | SUBCUTANEOUS | Status: DC
  Filled 2024-12-27 (×2): qty 0.1

## 2024-12-27 MED ORDER — INSULIN ASPART 100 UNIT/ML IJ SOLN
30.0000 [IU] | Freq: Once | INTRAMUSCULAR | Status: AC
  Administered 2024-12-27: 30 [IU] via SUBCUTANEOUS
  Filled 2024-12-27: qty 30

## 2024-12-27 MED ORDER — INSULIN GLARGINE 100 UNIT/ML ~~LOC~~ SOLN
15.0000 [IU] | Freq: Two times a day (BID) | SUBCUTANEOUS | Status: DC
  Administered 2024-12-27 – 2024-12-28 (×2): 15 [IU] via SUBCUTANEOUS
  Filled 2024-12-27 (×5): qty 0.15

## 2024-12-27 NOTE — Progress Notes (Signed)
 [facility] [phone] [facility] Liaison Note  Received request for hospice services at home after discharge from [facility], [facility].  Patient/family deny any DME needs at this time.  Please send completed and signed DNR with patient at discharge.  Please provide prescriptions as needed to ensure ongoing symptom management needs.  Please call with any questions or concerns.  Hospital Liaison Team will follow for discharge disposition.  Thank you, Randine Nail, BSN, [facility] [phone]

## 2024-12-27 NOTE — Plan of Care (Signed)
 Problem: Education: Goal: Ability to describe self-care measures that may prevent or decrease complications (Diabetes Survival Skills Education) will improve Outcome: Progressing Goal: Individualized Educational Video(s) Outcome: Progressing   Problem: Coping: Goal: Ability to adjust to condition or change in health will improve Outcome: Progressing   Problem: Fluid Volume: Goal: Ability to maintain a balanced intake and output will improve Outcome: Progressing   Problem: Health Behavior/Discharge Planning: Goal: Ability to identify and utilize available resources and services will improve Outcome: Progressing Goal: Ability to manage health-related needs will improve Outcome: Progressing   Problem: Metabolic: Goal: Ability to maintain appropriate glucose levels will improve Outcome: Progressing   Problem: Nutritional: Goal: Maintenance of adequate nutrition will improve Outcome: Progressing Goal: Progress toward achieving an optimal weight will improve Outcome: Progressing   Problem: Skin Integrity: Goal: Risk for impaired skin integrity will decrease Outcome: Progressing   Problem: Tissue Perfusion: Goal: Adequacy of tissue perfusion will improve Outcome: Progressing   Problem: Education: Goal: Knowledge of General Education information will improve Description: Including pain rating scale, medication(s)/side effects and non-pharmacologic comfort measures Outcome: Progressing   Problem: Health Behavior/Discharge Planning: Goal: Ability to manage health-related needs will improve Outcome: Progressing   Problem: Clinical Measurements: Goal: Ability to maintain clinical measurements within normal limits will improve Outcome: Progressing Goal: Will remain free from infection Outcome: Progressing Goal: Diagnostic test results will improve Outcome: Progressing Goal: Respiratory complications will improve Outcome: Progressing Goal: Cardiovascular complication will  be avoided Outcome: Progressing   Problem: Activity: Goal: Risk for activity intolerance will decrease Outcome: Progressing   Problem: Nutrition: Goal: Adequate nutrition will be maintained Outcome: Progressing   Problem: Coping: Goal: Level of anxiety will decrease Outcome: Progressing   Problem: Elimination: Goal: Will not experience complications related to bowel motility Outcome: Progressing Goal: Will not experience complications related to urinary retention Outcome: Progressing   Problem: Pain Managment: Goal: General experience of comfort will improve and/or be controlled Outcome: Progressing   Problem: Safety: Goal: Ability to remain free from injury will improve Outcome: Progressing   Problem: Skin Integrity: Goal: Risk for impaired skin integrity will decrease Outcome: Progressing   Problem: Education: Goal: Knowledge of disease or condition will improve Outcome: Progressing Goal: Knowledge of secondary prevention will improve (MUST DOCUMENT ALL) Outcome: Progressing Goal: Knowledge of patient specific risk factors will improve (DELETE if not current risk factor) Outcome: Progressing   Problem: Ischemic Stroke/TIA Tissue Perfusion: Goal: Complications of ischemic stroke/TIA will be minimized Outcome: Progressing   Problem: Coping: Goal: Will verbalize positive feelings about self Outcome: Progressing Goal: Will identify appropriate support needs Outcome: Progressing   Problem: Health Behavior/Discharge Planning: Goal: Ability to manage health-related needs will improve Outcome: Progressing Goal: Goals will be collaboratively established with patient/family Outcome: Progressing   Problem: Self-Care: Goal: Ability to participate in self-care as condition permits will improve Outcome: Progressing Goal: Verbalization of feelings and concerns over difficulty with self-care will improve Outcome: Progressing Goal: Ability to communicate needs  accurately will improve Outcome: Progressing   Problem: Nutrition: Goal: Risk of aspiration will decrease Outcome: Progressing Goal: Dietary intake will improve Outcome: Progressing   Problem: Education: Goal: Knowledge of the prescribed therapeutic regimen will improve Outcome: Progressing   Problem: Coping: Goal: Ability to identify and develop effective coping behavior will improve Outcome: Progressing   Problem: Clinical Measurements: Goal: Quality of life will improve Outcome: Progressing   Problem: Respiratory: Goal: Verbalizations of increased ease of respirations will increase Outcome: Progressing   Problem: Role Relationship: Goal: Family's ability to  cope with current situation will improve Outcome: Progressing Goal: Ability to verbalize concerns, feelings, and thoughts to partner or family member will improve Outcome: Progressing   Problem: Pain Management: Goal: Satisfaction with pain management regimen will improve Outcome: Progressing

## 2024-12-28 ENCOUNTER — Other Ambulatory Visit (HOSPITAL_COMMUNITY): Payer: Self-pay

## 2024-12-28 LAB — GLUCOSE, CAPILLARY: Glucose-Capillary: 163 mg/dL — ABNORMAL HIGH (ref 70–99)

## 2024-12-28 LAB — CULTURE, BLOOD (ROUTINE X 2)
Culture: NO GROWTH
Special Requests: ADEQUATE

## 2024-12-28 MED ORDER — GLYCOPYRROLATE 1 MG PO TABS
1.0000 mg | ORAL_TABLET | ORAL | 0 refills | Status: AC | PRN
  Filled 2024-12-28: qty 30, 5d supply, fill #0

## 2024-12-28 MED ORDER — MORPHINE SULFATE (CONCENTRATE) 10 MG /0.5 ML PO SOLN
5.0000 mg | ORAL | 0 refills | Status: AC | PRN
  Filled 2024-12-28: qty 30, 15d supply, fill #0

## 2024-12-28 NOTE — Progress Notes (Signed)
 DISCHARGE NOTE [facility] to be discharged Home per MD order. Patient verbalized understanding.  Skin clean, dry and intact without evidence of skin break down, no evidence of skin tears noted. IV catheter discontinued intact. Site without signs and symptoms of complications. Dressing and pressure applied. Pt denies pain at the site currently. No complaints noted.  Patient free of lines, drains, and wounds.   Discharge packet assembled. An After Visit Summary (AVS) was printed and given to the EMS personnel. Patient escorted via stretcher and discharged to [facility] via ambulance. Report called to accepting facility; all questions and concerns addressed.   Peyton SHAUNNA Pepper, RN

## 2024-12-28 NOTE — Plan of Care (Signed)
 Problem: Education: Goal: Ability to describe self-care measures that may prevent or decrease complications (Diabetes Survival Skills Education) will improve Outcome: Progressing Goal: Individualized Educational Video(s) Outcome: Progressing   Problem: Coping: Goal: Ability to adjust to condition or change in health will improve Outcome: Progressing   Problem: Fluid Volume: Goal: Ability to maintain a balanced intake and output will improve Outcome: Progressing   Problem: Health Behavior/Discharge Planning: Goal: Ability to identify and utilize available resources and services will improve Outcome: Progressing Goal: Ability to manage health-related needs will improve Outcome: Progressing   Problem: Metabolic: Goal: Ability to maintain appropriate glucose levels will improve Outcome: Progressing   Problem: Nutritional: Goal: Maintenance of adequate nutrition will improve Outcome: Progressing Goal: Progress toward achieving an optimal weight will improve Outcome: Progressing   Problem: Skin Integrity: Goal: Risk for impaired skin integrity will decrease Outcome: Progressing   Problem: Tissue Perfusion: Goal: Adequacy of tissue perfusion will improve Outcome: Progressing   Problem: Education: Goal: Knowledge of General Education information will improve Description: Including pain rating scale, medication(s)/side effects and non-pharmacologic comfort measures Outcome: Progressing   Problem: Health Behavior/Discharge Planning: Goal: Ability to manage health-related needs will improve Outcome: Progressing   Problem: Clinical Measurements: Goal: Ability to maintain clinical measurements within normal limits will improve Outcome: Progressing Goal: Will remain free from infection Outcome: Progressing Goal: Diagnostic test results will improve Outcome: Progressing Goal: Respiratory complications will improve Outcome: Progressing Goal: Cardiovascular complication will  be avoided Outcome: Progressing   Problem: Activity: Goal: Risk for activity intolerance will decrease Outcome: Progressing   Problem: Nutrition: Goal: Adequate nutrition will be maintained Outcome: Progressing   Problem: Coping: Goal: Level of anxiety will decrease Outcome: Progressing   Problem: Elimination: Goal: Will not experience complications related to bowel motility Outcome: Progressing Goal: Will not experience complications related to urinary retention Outcome: Progressing   Problem: Pain Managment: Goal: General experience of comfort will improve and/or be controlled Outcome: Progressing   Problem: Safety: Goal: Ability to remain free from injury will improve Outcome: Progressing   Problem: Skin Integrity: Goal: Risk for impaired skin integrity will decrease Outcome: Progressing   Problem: Education: Goal: Knowledge of disease or condition will improve Outcome: Progressing Goal: Knowledge of secondary prevention will improve (MUST DOCUMENT ALL) Outcome: Progressing Goal: Knowledge of patient specific risk factors will improve (DELETE if not current risk factor) Outcome: Progressing   Problem: Ischemic Stroke/TIA Tissue Perfusion: Goal: Complications of ischemic stroke/TIA will be minimized Outcome: Progressing   Problem: Coping: Goal: Will verbalize positive feelings about self Outcome: Progressing Goal: Will identify appropriate support needs Outcome: Progressing   Problem: Health Behavior/Discharge Planning: Goal: Ability to manage health-related needs will improve Outcome: Progressing Goal: Goals will be collaboratively established with patient/family Outcome: Progressing   Problem: Self-Care: Goal: Ability to participate in self-care as condition permits will improve Outcome: Progressing Goal: Verbalization of feelings and concerns over difficulty with self-care will improve Outcome: Progressing Goal: Ability to communicate needs  accurately will improve Outcome: Progressing   Problem: Nutrition: Goal: Risk of aspiration will decrease Outcome: Progressing Goal: Dietary intake will improve Outcome: Progressing   Problem: Education: Goal: Knowledge of the prescribed therapeutic regimen will improve Outcome: Progressing   Problem: Coping: Goal: Ability to identify and develop effective coping behavior will improve Outcome: Progressing   Problem: Clinical Measurements: Goal: Quality of life will improve Outcome: Progressing   Problem: Respiratory: Goal: Verbalizations of increased ease of respirations will increase Outcome: Progressing   Problem: Role Relationship: Goal: Family's ability to  cope with current situation will improve Outcome: Progressing Goal: Ability to verbalize concerns, feelings, and thoughts to partner or family member will improve Outcome: Progressing   Problem: Pain Management: Goal: Satisfaction with pain management regimen will improve Outcome: Progressing

## 2024-12-28 NOTE — TOC Transition Note (Signed)
 Transition of Care [facility]) - Discharge Note   Patient Details  Name: Danielle Clarke MRN: [id number] Date of Birth: Jan 03, 1941  [facility]) CM/SW Contact:  Robynn Eileen Hoose, RN Phone Number: 12/28/2024, 9:05 AM   Clinical Narrative:   Patient is being discharged home under hospice care. [facility] made aware. Spoke with daughter, confirmed that someone will be home to accept patient when PTAR arrives. PTAR forms completed and PTAR transportation arranged.    Final next level of care: Home w Hospice Care Barriers to Discharge: No Barriers Identified   Patient Goals and CMS Choice Patient states their goals for this hospitalization and ongoing recovery are:: Plan for home with Hospice   Choice offered to / list presented to : Patient      Discharge Placement                       Discharge Plan and Services Additional resources added to the After Visit Summary for   In-house Referral: Clinical Social Work Discharge Planning Services: CM Consult Post Acute Care Choice: Hospice                    HH Arranged: RN [facility] Agency:  [facility]) Date [facility] Agency Contacted: 12/26/24 Time HH Agency Contacted: [phone] Representative spoke with at [facility] Agency: Inocente  Social Drivers of Health (SDOH) Interventions SDOH Screenings   Food Insecurity: No Food Insecurity (12/23/2024)  Housing: Low Risk (12/23/2024)  Transportation Needs: No Transportation Needs (12/23/2024)  Utilities: Patient Unable To Answer (12/23/2024)  Social Connections: Unknown (12/23/2024)  Tobacco Use: Low Risk (12/23/2024)     Readmission Risk Interventions    12/10/2024   12:01 PM 12/09/2024    3:42 PM 04/23/2024   11:16 AM  Readmission Risk Prevention Plan  Transportation Screening Complete  Complete  PCP or Specialist Appt within 5-7 Days Complete Complete Complete  Home Care Screening Complete Complete Complete  Medication Review (RN CM) Complete Complete Complete

## 2024-12-28 NOTE — Progress Notes (Signed)
 AVS , most , and dnr in packet for transport

## 2024-12-28 NOTE — Progress Notes (Signed)
 AVS, DNR and MOST in packet for discharge

## 2024-12-28 NOTE — Progress Notes (Signed)
 Spoke to daughter to let know PTAR here and heqding to the house.  Let her kow Discharge info in packet answered her questions.

## 2024-12-30 LAB — CULTURE, BLOOD (ROUTINE X 2): Culture: NO GROWTH

## 2024-12-31 ENCOUNTER — Other Ambulatory Visit: Payer: Self-pay | Admitting: Internal Medicine

## 2025-01-07 ENCOUNTER — Ambulatory Visit: Admitting: Emergency Medicine
